# Patient Record
Sex: Male | Born: 1940
Health system: Southern US, Community
[De-identification: ages and names within clinical notes are randomized; demographics above are authoritative.]

## PROBLEM LIST (undated history)

## (undated) DIAGNOSIS — K5792 Diverticulitis of intestine, part unspecified, without perforation or abscess without bleeding: Secondary | ICD-10-CM

## (undated) DIAGNOSIS — N200 Calculus of kidney: Secondary | ICD-10-CM

## (undated) DIAGNOSIS — I340 Nonrheumatic mitral (valve) insufficiency: Secondary | ICD-10-CM

## (undated) DIAGNOSIS — I10 Essential (primary) hypertension: Secondary | ICD-10-CM

## (undated) DIAGNOSIS — E785 Hyperlipidemia, unspecified: Secondary | ICD-10-CM

## (undated) DIAGNOSIS — Z87442 Personal history of urinary calculi: Secondary | ICD-10-CM

## (undated) DIAGNOSIS — Z8489 Family history of other specified conditions: Secondary | ICD-10-CM

## (undated) DIAGNOSIS — K635 Polyp of colon: Secondary | ICD-10-CM

## (undated) DIAGNOSIS — K219 Gastro-esophageal reflux disease without esophagitis: Secondary | ICD-10-CM

## (undated) DIAGNOSIS — I219 Acute myocardial infarction, unspecified: Secondary | ICD-10-CM

## (undated) DIAGNOSIS — I2510 Atherosclerotic heart disease of native coronary artery without angina pectoris: Secondary | ICD-10-CM

## (undated) DIAGNOSIS — H9190 Unspecified hearing loss, unspecified ear: Secondary | ICD-10-CM

## (undated) DIAGNOSIS — I351 Nonrheumatic aortic (valve) insufficiency: Secondary | ICD-10-CM

## (undated) DIAGNOSIS — B019 Varicella without complication: Secondary | ICD-10-CM

## (undated) DIAGNOSIS — C439 Malignant melanoma of skin, unspecified: Secondary | ICD-10-CM

## (undated) DIAGNOSIS — R011 Cardiac murmur, unspecified: Secondary | ICD-10-CM

## (undated) DIAGNOSIS — I255 Ischemic cardiomyopathy: Secondary | ICD-10-CM

## (undated) DIAGNOSIS — M1990 Unspecified osteoarthritis, unspecified site: Secondary | ICD-10-CM

## (undated) DIAGNOSIS — I519 Heart disease, unspecified: Secondary | ICD-10-CM

## (undated) DIAGNOSIS — E119 Type 2 diabetes mellitus without complications: Secondary | ICD-10-CM

## (undated) HISTORY — DX: Type 2 diabetes mellitus without complications: E11.9

## (undated) HISTORY — DX: Unspecified osteoarthritis, unspecified site: M19.90

## (undated) HISTORY — PX: CORONARY ANGIOPLASTY WITH STENT PLACEMENT: SHX49

## (undated) HISTORY — DX: Atherosclerotic heart disease of native coronary artery without angina pectoris: I25.10

## (undated) HISTORY — DX: Diverticulitis of intestine, part unspecified, without perforation or abscess without bleeding: K57.92

## (undated) HISTORY — DX: Nonrheumatic mitral (valve) insufficiency: I34.0

## (undated) HISTORY — PX: WISDOM TOOTH EXTRACTION: SHX21

## (undated) HISTORY — DX: Ischemic cardiomyopathy: I25.5

## (undated) HISTORY — DX: Essential (primary) hypertension: I10

## (undated) HISTORY — DX: Calculus of kidney: N20.0

## (undated) HISTORY — DX: Hyperlipidemia, unspecified: E78.5

## (undated) HISTORY — DX: Nonrheumatic aortic (valve) insufficiency: I35.1

## (undated) HISTORY — DX: Varicella without complication: B01.9

## (undated) HISTORY — DX: Malignant melanoma of skin, unspecified: C43.9

## (undated) HISTORY — DX: Heart disease, unspecified: I51.9

## (undated) HISTORY — PX: COLONOSCOPY: SHX174

## (undated) HISTORY — DX: Polyp of colon: K63.5

---
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
## 1978-01-14 DIAGNOSIS — C439 Malignant melanoma of skin, unspecified: Secondary | ICD-10-CM

## 1978-01-14 HISTORY — DX: Malignant melanoma of skin, unspecified: C43.9

## 1978-01-14 HISTORY — PX: MELANOMA EXCISION: SHX5266

## 2007-01-28 ENCOUNTER — Encounter: Payer: Self-pay | Admitting: Internal Medicine

## 2007-09-20 ENCOUNTER — Encounter: Payer: Self-pay | Admitting: Internal Medicine

## 2007-09-23 ENCOUNTER — Encounter: Payer: Self-pay | Admitting: Internal Medicine

## 2010-01-14 HISTORY — PX: CHOLECYSTECTOMY: SHX55

## 2010-10-15 ENCOUNTER — Encounter: Payer: Self-pay | Admitting: Internal Medicine

## 2013-01-14 HISTORY — PX: LITHOTRIPSY: SUR834

## 2013-01-14 HISTORY — PX: NERVE SURGERY: SHX1016

## 2013-08-18 ENCOUNTER — Encounter: Payer: Self-pay | Admitting: Internal Medicine

## 2013-09-15 ENCOUNTER — Encounter: Payer: Self-pay | Admitting: Internal Medicine

## 2013-09-16 ENCOUNTER — Encounter: Payer: Self-pay | Admitting: Internal Medicine

## 2013-09-22 ENCOUNTER — Encounter: Payer: Self-pay | Admitting: Internal Medicine

## 2013-09-28 ENCOUNTER — Encounter: Payer: Self-pay | Admitting: Internal Medicine

## 2013-10-01 ENCOUNTER — Encounter: Payer: Self-pay | Admitting: Internal Medicine

## 2013-10-06 ENCOUNTER — Encounter: Payer: Self-pay | Admitting: Internal Medicine

## 2014-03-01 DIAGNOSIS — M542 Cervicalgia: Secondary | ICD-10-CM | POA: Diagnosis not present

## 2014-03-01 DIAGNOSIS — J209 Acute bronchitis, unspecified: Secondary | ICD-10-CM | POA: Diagnosis not present

## 2014-05-10 DIAGNOSIS — I2510 Atherosclerotic heart disease of native coronary artery without angina pectoris: Secondary | ICD-10-CM | POA: Diagnosis not present

## 2014-05-10 DIAGNOSIS — I509 Heart failure, unspecified: Secondary | ICD-10-CM | POA: Diagnosis not present

## 2014-05-10 DIAGNOSIS — E119 Type 2 diabetes mellitus without complications: Secondary | ICD-10-CM | POA: Diagnosis not present

## 2014-06-15 DIAGNOSIS — L570 Actinic keratosis: Secondary | ICD-10-CM | POA: Diagnosis not present

## 2014-06-15 DIAGNOSIS — Z85820 Personal history of malignant melanoma of skin: Secondary | ICD-10-CM | POA: Diagnosis not present

## 2014-06-15 DIAGNOSIS — L821 Other seborrheic keratosis: Secondary | ICD-10-CM | POA: Diagnosis not present

## 2014-06-15 DIAGNOSIS — Z08 Encounter for follow-up examination after completed treatment for malignant neoplasm: Secondary | ICD-10-CM | POA: Diagnosis not present

## 2014-06-22 DIAGNOSIS — E119 Type 2 diabetes mellitus without complications: Secondary | ICD-10-CM | POA: Diagnosis not present

## 2014-06-22 DIAGNOSIS — E785 Hyperlipidemia, unspecified: Secondary | ICD-10-CM | POA: Diagnosis not present

## 2014-06-27 DIAGNOSIS — E119 Type 2 diabetes mellitus without complications: Secondary | ICD-10-CM | POA: Diagnosis not present

## 2014-06-28 DIAGNOSIS — I10 Essential (primary) hypertension: Secondary | ICD-10-CM | POA: Diagnosis not present

## 2014-06-28 DIAGNOSIS — J329 Chronic sinusitis, unspecified: Secondary | ICD-10-CM | POA: Diagnosis not present

## 2014-06-28 DIAGNOSIS — E785 Hyperlipidemia, unspecified: Secondary | ICD-10-CM | POA: Diagnosis not present

## 2014-06-28 DIAGNOSIS — E119 Type 2 diabetes mellitus without complications: Secondary | ICD-10-CM | POA: Diagnosis not present

## 2014-07-13 DIAGNOSIS — J310 Chronic rhinitis: Secondary | ICD-10-CM | POA: Diagnosis not present

## 2014-07-13 DIAGNOSIS — H9191 Unspecified hearing loss, right ear: Secondary | ICD-10-CM | POA: Diagnosis not present

## 2014-08-20 DIAGNOSIS — N200 Calculus of kidney: Secondary | ICD-10-CM | POA: Diagnosis not present

## 2014-08-22 DIAGNOSIS — N390 Urinary tract infection, site not specified: Secondary | ICD-10-CM | POA: Diagnosis not present

## 2014-08-22 DIAGNOSIS — N200 Calculus of kidney: Secondary | ICD-10-CM | POA: Diagnosis not present

## 2014-08-25 DIAGNOSIS — Z01818 Encounter for other preprocedural examination: Secondary | ICD-10-CM | POA: Diagnosis not present

## 2014-08-25 DIAGNOSIS — N200 Calculus of kidney: Secondary | ICD-10-CM | POA: Diagnosis not present

## 2014-08-30 DIAGNOSIS — J310 Chronic rhinitis: Secondary | ICD-10-CM | POA: Diagnosis not present

## 2014-09-12 ENCOUNTER — Encounter: Payer: Self-pay | Admitting: Internal Medicine

## 2014-09-12 DIAGNOSIS — N202 Calculus of kidney with calculus of ureter: Secondary | ICD-10-CM | POA: Diagnosis not present

## 2014-09-27 DIAGNOSIS — Z7189 Other specified counseling: Secondary | ICD-10-CM | POA: Diagnosis not present

## 2014-09-27 DIAGNOSIS — Z85820 Personal history of malignant melanoma of skin: Secondary | ICD-10-CM | POA: Diagnosis not present

## 2014-09-27 DIAGNOSIS — Z23 Encounter for immunization: Secondary | ICD-10-CM | POA: Diagnosis not present

## 2014-09-27 DIAGNOSIS — L570 Actinic keratosis: Secondary | ICD-10-CM | POA: Diagnosis not present

## 2014-09-27 DIAGNOSIS — D1801 Hemangioma of skin and subcutaneous tissue: Secondary | ICD-10-CM | POA: Diagnosis not present

## 2014-11-08 DIAGNOSIS — I2510 Atherosclerotic heart disease of native coronary artery without angina pectoris: Secondary | ICD-10-CM | POA: Diagnosis not present

## 2014-11-08 DIAGNOSIS — E119 Type 2 diabetes mellitus without complications: Secondary | ICD-10-CM | POA: Diagnosis not present

## 2014-11-08 DIAGNOSIS — I509 Heart failure, unspecified: Secondary | ICD-10-CM | POA: Diagnosis not present

## 2014-12-13 DIAGNOSIS — E119 Type 2 diabetes mellitus without complications: Secondary | ICD-10-CM | POA: Diagnosis not present

## 2014-12-13 DIAGNOSIS — H5213 Myopia, bilateral: Secondary | ICD-10-CM | POA: Diagnosis not present

## 2014-12-13 DIAGNOSIS — H2513 Age-related nuclear cataract, bilateral: Secondary | ICD-10-CM | POA: Diagnosis not present

## 2014-12-14 DIAGNOSIS — H40013 Open angle with borderline findings, low risk, bilateral: Secondary | ICD-10-CM | POA: Diagnosis not present

## 2014-12-19 DIAGNOSIS — E119 Type 2 diabetes mellitus without complications: Secondary | ICD-10-CM | POA: Diagnosis not present

## 2014-12-19 LAB — LIPID PANEL
Cholesterol: 122 mg/dL (ref 0–200)
HDL: 39 mg/dL (ref 35–70)
LDL Cholesterol: 42 mg/dL
Triglycerides: 203 mg/dL — AB (ref 40–160)

## 2014-12-19 LAB — BASIC METABOLIC PANEL
BUN: 19 mg/dL (ref 4–21)
Creatinine: 1 mg/dL (ref 0.6–1.3)
Glucose: 164 mg/dL
Potassium: 4 mmol/L (ref 3.4–5.3)
Sodium: 142 mmol/L (ref 137–147)

## 2014-12-19 LAB — CBC AND DIFFERENTIAL
HCT: 42 % (ref 41–53)
Hemoglobin: 14.6 g/dL (ref 13.5–17.5)
Platelets: 167 10*3/uL (ref 150–399)
WBC: 6.3 10^3/mL

## 2014-12-19 LAB — TSH: TSH: 0.76 u[IU]/mL (ref 0.41–5.90)

## 2014-12-19 LAB — HEPATIC FUNCTION PANEL
ALT: 52 U/L — AB (ref 10–40)
AST: 37 U/L (ref 14–40)
Alkaline Phosphatase: 37 U/L (ref 25–125)

## 2014-12-19 LAB — HEMOGLOBIN A1C: Hemoglobin A1C: 6.8

## 2014-12-23 DIAGNOSIS — E119 Type 2 diabetes mellitus without complications: Secondary | ICD-10-CM | POA: Diagnosis not present

## 2014-12-29 DIAGNOSIS — E785 Hyperlipidemia, unspecified: Secondary | ICD-10-CM | POA: Diagnosis not present

## 2014-12-29 DIAGNOSIS — E119 Type 2 diabetes mellitus without complications: Secondary | ICD-10-CM | POA: Diagnosis not present

## 2014-12-29 DIAGNOSIS — I10 Essential (primary) hypertension: Secondary | ICD-10-CM | POA: Diagnosis not present

## 2014-12-29 DIAGNOSIS — Z1211 Encounter for screening for malignant neoplasm of colon: Secondary | ICD-10-CM | POA: Diagnosis not present

## 2014-12-30 DIAGNOSIS — Z23 Encounter for immunization: Secondary | ICD-10-CM | POA: Diagnosis not present

## 2015-01-06 DIAGNOSIS — Z23 Encounter for immunization: Secondary | ICD-10-CM | POA: Diagnosis not present

## 2015-04-12 DIAGNOSIS — Z800 Family history of malignant neoplasm of digestive organs: Secondary | ICD-10-CM | POA: Diagnosis not present

## 2015-04-12 DIAGNOSIS — Z86010 Personal history of colonic polyps: Secondary | ICD-10-CM | POA: Diagnosis not present

## 2015-04-12 DIAGNOSIS — D123 Benign neoplasm of transverse colon: Secondary | ICD-10-CM | POA: Diagnosis not present

## 2015-04-12 DIAGNOSIS — D122 Benign neoplasm of ascending colon: Secondary | ICD-10-CM | POA: Diagnosis not present

## 2015-04-12 DIAGNOSIS — K5730 Diverticulosis of large intestine without perforation or abscess without bleeding: Secondary | ICD-10-CM | POA: Diagnosis not present

## 2015-04-12 LAB — HM COLONOSCOPY

## 2015-04-13 DIAGNOSIS — D122 Benign neoplasm of ascending colon: Secondary | ICD-10-CM | POA: Diagnosis not present

## 2015-04-13 DIAGNOSIS — D123 Benign neoplasm of transverse colon: Secondary | ICD-10-CM | POA: Diagnosis not present

## 2015-04-24 DIAGNOSIS — R109 Unspecified abdominal pain: Secondary | ICD-10-CM | POA: Diagnosis not present

## 2015-05-09 DIAGNOSIS — I2510 Atherosclerotic heart disease of native coronary artery without angina pectoris: Secondary | ICD-10-CM | POA: Diagnosis not present

## 2015-05-09 DIAGNOSIS — E119 Type 2 diabetes mellitus without complications: Secondary | ICD-10-CM | POA: Diagnosis not present

## 2015-05-09 DIAGNOSIS — I509 Heart failure, unspecified: Secondary | ICD-10-CM | POA: Diagnosis not present

## 2015-07-07 ENCOUNTER — Ambulatory Visit (INDEPENDENT_AMBULATORY_CARE_PROVIDER_SITE_OTHER): Payer: Medicare Other | Admitting: Family Medicine

## 2015-07-07 ENCOUNTER — Encounter: Payer: Self-pay | Admitting: Family Medicine

## 2015-07-07 VITALS — BP 126/62 | HR 60 | Temp 97.6°F | Ht 73.0 in | Wt 209.2 lb

## 2015-07-07 DIAGNOSIS — I2510 Atherosclerotic heart disease of native coronary artery without angina pectoris: Secondary | ICD-10-CM | POA: Diagnosis not present

## 2015-07-07 DIAGNOSIS — R200 Anesthesia of skin: Secondary | ICD-10-CM

## 2015-07-07 DIAGNOSIS — I10 Essential (primary) hypertension: Secondary | ICD-10-CM

## 2015-07-07 DIAGNOSIS — R1032 Left lower quadrant pain: Secondary | ICD-10-CM

## 2015-07-07 DIAGNOSIS — E785 Hyperlipidemia, unspecified: Secondary | ICD-10-CM | POA: Diagnosis not present

## 2015-07-07 DIAGNOSIS — E1142 Type 2 diabetes mellitus with diabetic polyneuropathy: Secondary | ICD-10-CM | POA: Diagnosis not present

## 2015-07-07 DIAGNOSIS — G4700 Insomnia, unspecified: Secondary | ICD-10-CM

## 2015-07-07 DIAGNOSIS — R208 Other disturbances of skin sensation: Secondary | ICD-10-CM

## 2015-07-07 NOTE — Progress Notes (Signed)
Pre visit review using our clinic review tool, if applicable. No additional management support is needed unless otherwise documented below in the visit note. 

## 2015-07-07 NOTE — Patient Instructions (Signed)
Continue your current medications.  We will call regarding the cardiology appt.  Follow up in 3 months or sooner if needed.   Take care  Dr. Lacinda Axon   Health Maintenance, Male A healthy lifestyle and preventative care can promote health and wellness.  Maintain regular health, dental, and eye exams.  Eat a healthy diet. Foods like vegetables, fruits, whole grains, low-fat dairy products, and lean protein foods contain the nutrients you need and are low in calories. Decrease your intake of foods high in solid fats, added sugars, and salt. Get information about a proper diet from your health care provider, if necessary.  Regular physical exercise is one of the most important things you can do for your health. Most adults should get at least 150 minutes of moderate-intensity exercise (any activity that increases your heart rate and causes you to sweat) each week. In addition, most adults need muscle-strengthening exercises on 2 or more days a week.   Maintain a healthy weight. The body mass index (BMI) is a screening tool to identify possible weight problems. It provides an estimate of body fat based on height and weight. Your health care provider can find your BMI and can help you achieve or maintain a healthy weight. For males 20 years and older:  A BMI below 18.5 is considered underweight.  A BMI of 18.5 to 24.9 is normal.  A BMI of 25 to 29.9 is considered overweight.  A BMI of 30 and above is considered obese.  Maintain normal blood lipids and cholesterol by exercising and minimizing your intake of saturated fat. Eat a balanced diet with plenty of fruits and vegetables. Blood tests for lipids and cholesterol should begin at age 99 and be repeated every 5 years. If your lipid or cholesterol levels are high, you are over age 46, or you are at high risk for heart disease, you may need your cholesterol levels checked more frequently.Ongoing high lipid and cholesterol levels should be treated  with medicines if diet and exercise are not working.  If you smoke, find out from your health care provider how to quit. If you do not use tobacco, do not start.  Lung cancer screening is recommended for adults aged 59-80 years who are at high risk for developing lung cancer because of a history of smoking. A yearly low-dose CT scan of the lungs is recommended for people who have at least a 30-pack-year history of smoking and are current smokers or have quit within the past 15 years. A pack year of smoking is smoking an average of 1 pack of cigarettes a day for 1 year (for example, a 30-pack-year history of smoking could mean smoking 1 pack a day for 30 years or 2 packs a day for 15 years). Yearly screening should continue until the smoker has stopped smoking for at least 15 years. Yearly screening should be stopped for people who develop a health problem that would prevent them from having lung cancer treatment.  If you choose to drink alcohol, do not have more than 2 drinks per day. One drink is considered to be 12 oz (360 mL) of beer, 5 oz (150 mL) of wine, or 1.5 oz (45 mL) of liquor.  Avoid the use of street drugs. Do not share needles with anyone. Ask for help if you need support or instructions about stopping the use of drugs.  High blood pressure causes heart disease and increases the risk of stroke. High blood pressure is more likely to develop  in:  People who have blood pressure in the end of the normal range (100-139/85-89 mm Hg).  People who are overweight or obese.  People who are African American.  If you are 60-54 years of age, have your blood pressure checked every 3-5 years. If you are 21 years of age or older, have your blood pressure checked every year. You should have your blood pressure measured twice--once when you are at a hospital or clinic, and once when you are not at a hospital or clinic. Record the average of the two measurements. To check your blood pressure when you  are not at a hospital or clinic, you can use:  An automated blood pressure machine at a pharmacy.  A home blood pressure monitor.  If you are 43-88 years old, ask your health care provider if you should take aspirin to prevent heart disease.  Diabetes screening involves taking a blood sample to check your fasting blood sugar level. This should be done once every 3 years after age 73 if you are at a normal weight and without risk factors for diabetes. Testing should be considered at a younger age or be carried out more frequently if you are overweight and have at least 1 risk factor for diabetes.  Colorectal cancer can be detected and often prevented. Most routine colorectal cancer screening begins at the age of 21 and continues through age 75. However, your health care provider may recommend screening at an earlier age if you have risk factors for colon cancer. On a yearly basis, your health care provider may provide home test kits to check for hidden blood in the stool. A small camera at the end of a tube may be used to directly examine the colon (sigmoidoscopy or colonoscopy) to detect the earliest forms of colorectal cancer. Talk to your health care provider about this at age 22 when routine screening begins. A direct exam of the colon should be repeated every 5-10 years through age 68, unless early forms of precancerous polyps or small growths are found.  People who are at an increased risk for hepatitis B should be screened for this virus. You are considered at high risk for hepatitis B if:  You were born in a country where hepatitis B occurs often. Talk with your health care provider about which countries are considered high risk.  Your parents were born in a high-risk country and you have not received a shot to protect against hepatitis B (hepatitis B vaccine).  You have HIV or AIDS.  You use needles to inject street drugs.  You live with, or have sex with, someone who has hepatitis  B.  You are a man who has sex with other men (MSM).  You get hemodialysis treatment.  You take certain medicines for conditions like cancer, organ transplantation, and autoimmune conditions.  Hepatitis C blood testing is recommended for all people born from [birth year] through 1965 and any individual with known risk factors for hepatitis C.  Healthy men should no longer receive prostate-specific antigen (PSA) blood tests as part of routine cancer screening. Talk to your health care provider about prostate cancer screening.  Testicular cancer screening is not recommended for adolescents or adult males who have no symptoms. Screening includes self-exam, a health care provider exam, and other screening tests. Consult with your health care provider about any symptoms you have or any concerns you have about testicular cancer.  Practice safe sex. Use condoms and avoid high-risk sexual practices to reduce the  spread of sexually transmitted infections (STIs).  You should be screened for STIs, including gonorrhea and chlamydia if:  You are sexually active and are younger than 24 years.  You are older than 24 years, and your health care provider tells you that you are at risk for this type of infection.  Your sexual activity has changed since you were last screened, and you are at an increased risk for chlamydia or gonorrhea. Ask your health care provider if you are at risk.  If you are at risk of being infected with HIV, it is recommended that you take a prescription medicine daily to prevent HIV infection. This is called pre-exposure prophylaxis (PrEP). You are considered at risk if:  You are a man who has sex with other men (MSM).  You are a heterosexual man who is sexually active with multiple partners.  You take drugs by injection.  You are sexually active with a partner who has HIV.  Talk with your health care provider about whether you are at high risk of being infected with HIV. If you  choose to begin PrEP, you should first be tested for HIV. You should then be tested every 3 months for as long as you are taking PrEP.  Use sunscreen. Apply sunscreen liberally and repeatedly throughout the day. You should seek shade when your shadow is shorter than you. Protect yourself by wearing long sleeves, pants, a wide-brimmed hat, and sunglasses year round whenever you are outdoors.  Tell your health care provider of new moles or changes in moles, especially if there is a change in shape or color. Also, tell your health care provider if a mole is larger than the size of a pencil eraser.  A one-time screening for abdominal aortic aneurysm (AAA) and surgical repair of large AAAs by ultrasound is recommended for men aged 11-75 years who are current or former smokers.  Stay current with your vaccines (immunizations).   This information is not intended to replace advice given to you by your health care provider. Make sure you discuss any questions you have with your health care provider.   Document Released: 06/29/2007 Document Revised: 01/21/2014 Document Reviewed: 05/28/2010 Elsevier Interactive Patient Education [employer].

## 2015-07-10 ENCOUNTER — Encounter: Payer: Self-pay | Admitting: Family Medicine

## 2015-07-10 DIAGNOSIS — R1032 Left lower quadrant pain: Secondary | ICD-10-CM | POA: Insufficient documentation

## 2015-07-10 DIAGNOSIS — E782 Mixed hyperlipidemia: Secondary | ICD-10-CM | POA: Insufficient documentation

## 2015-07-10 DIAGNOSIS — Z85820 Personal history of malignant melanoma of skin: Secondary | ICD-10-CM | POA: Insufficient documentation

## 2015-07-10 DIAGNOSIS — R200 Anesthesia of skin: Secondary | ICD-10-CM | POA: Insufficient documentation

## 2015-07-10 DIAGNOSIS — E118 Type 2 diabetes mellitus with unspecified complications: Secondary | ICD-10-CM | POA: Insufficient documentation

## 2015-07-10 DIAGNOSIS — G4700 Insomnia, unspecified: Secondary | ICD-10-CM | POA: Insufficient documentation

## 2015-07-10 DIAGNOSIS — N400 Enlarged prostate without lower urinary tract symptoms: Secondary | ICD-10-CM | POA: Insufficient documentation

## 2015-07-10 DIAGNOSIS — I2510 Atherosclerotic heart disease of native coronary artery without angina pectoris: Secondary | ICD-10-CM | POA: Insufficient documentation

## 2015-07-10 DIAGNOSIS — I10 Essential (primary) hypertension: Secondary | ICD-10-CM | POA: Insufficient documentation

## 2015-07-10 DIAGNOSIS — G8929 Other chronic pain: Secondary | ICD-10-CM | POA: Insufficient documentation

## 2015-07-10 DIAGNOSIS — M545 Low back pain, unspecified: Secondary | ICD-10-CM | POA: Insufficient documentation

## 2015-07-10 NOTE — Progress Notes (Signed)
Subjective:  Patient ID: [name] [name], male    DOB: 1940/02/24  Age: 75 y.o. MRN: [id number]  CC: Establish care, Abdominal pain, Hand numbness, Insomnia  HPI [name] [name] is a 75 y.o. male presents to the clinic today to establish care. Concerns/issues are below.    CAD  S/p MI x 2 and s/p PCI.  Currently stable. No Recent chest pain or increasing shortness of breath.  Compliant with aspirin, statin, ACE inhibitor, beta blocker.  Needs a cardiologist in our area.   HTN  Well controlled on HCTZ, lisinopril, metoprolol.  DM-2  Last A1c was 6.8 in December 2016.  Blood sugars well controlled and at goal.  Patient currently on metformin.  HLD  Triglycerides mildly elevated on last lipid panel in December 2016. LDL at that time was 42.  Patient currently on Lipitor 80 and fenofibrate.  Tolerating.  Stable.  Abdominal pain  Patient reports that he's had intermittent left lower quadrant pain for the past 6-8 months.  He reports that he was previously treated for diverticulitis with Cipro and Flagyl and had improvement.  However, his symptoms recurred.  He reports associated diarrhea but is unsure if this is from metformin.  Pain is moderate and intermittent.  No associated fevers or chills.  Of note, he had a colonoscopy earlier this year and was not told of any findings of diverticulitis or other abnormalities (other than polyps).  Hand numbness  Patient states for the past year he's had increasing numbness in his third, fourth and fifth fingers.  He states that it makes it difficult to grip things.  He states it's been worsening for the past month.  No known inciting factor. No known exacerbating or relieving factors.  Insomnia  Patient has recently been waking up in the middle of night and having difficulty going back to sleep.  He states that he typically works on something when he wakes up. He does this for a couple hours and then  returns back to sleep.  He attributes this to recent stressors and recent move.  He would like to discuss this today.  PMH, Surgical Hx, Family Hx, Social History reviewed and updated as below.  Past Medical History  Diagnosis Date  . Arthritis   . DM type 2 (diabetes mellitus, type 2) ([place])   . Hypertension   . Hyperlipidemia   . CAD (coronary artery disease)   . Chicken pox   . Diverticulitis   . Colon polyps     4 pre-cancerous   . Kidney stones   . Melanoma [facility])    Past Surgical History  Procedure Laterality Date  . Cholecystectomy  2012  . Tonsillectomy  1945  . Lithotripsy  2015  . Melanoma excision  1980    malignant  . Nerve surgery  2015    ulna nerve  . Coronary angioplasty with stent placement  1991 & 2005   Family History  Problem Relation Age of Onset  . Hyperlipidemia Mother   . Hypertension Mother   . Heart disease Mother   . Diabetes Mother   . Colon cancer Father   . Lung cancer Father   . Kidney cancer Father     malignant capsulated kidney tumor  . Diabetes Father    Social History  Substance Use Topics  . Smoking status: Current Every Day Smoker    Types: Pipe  . Smokeless tobacco: Never Used  . Alcohol Use: 0.0 - 0.6 oz/week    0-1 Standard drinks  or equivalent per week   Review of Systems  HENT: Positive for hearing loss.   Gastrointestinal: Positive for abdominal pain and diarrhea.  Genitourinary:       Frequent urination.  Musculoskeletal: Positive for myalgias.  All other systems reviewed and are negative.  Objective:   Today's Vitals: BP 126/62 mmHg  Pulse 60  Temp(Src) 97.6 F (36.4 C) (Oral)  Ht 6\' 1"  (1.854 m)  Wt 209 lb 4 oz (94.915 kg)  BMI 27.61 kg/m2  SpO2 95%  Physical Exam  Constitutional: He is oriented to person, place, and time. He appears well-developed and well-nourished. No distress.  HENT:  Head: Normocephalic and atraumatic.  Mouth/Throat: Oropharynx is clear and moist. No oropharyngeal exudate.    Normal TM's bilaterally.   Eyes: Conjunctivae are normal. No scleral icterus.  Neck: Neck supple.  Cardiovascular: Normal rate and regular rhythm.   [id number] systolic murmur.  Pulmonary/Chest: Effort normal and breath sounds normal. He has no wheezes. He has no rales.  Abdominal: Soft. He exhibits no distension.  Tender to palpation in the LUQ and LLQ.  Musculoskeletal: Normal range of motion. He exhibits no edema.  Neurological: He is alert and oriented to person, place, and time.  Skin: Skin is warm and dry. No rash noted.  Psychiatric: He has a normal mood and affect.  Vitals reviewed.  Assessment & Plan:   Problem List Items Addressed This Visit    CAD (coronary artery disease) - Primary    Stable.  Continue aspirin, metoprolol, lisinopril, Lipitor. Referring to cardiology. Will obtain records.       Relevant Medications   metoprolol succinate (TOPROL-XL) 25 MG 24 hr tablet   fenofibrate 160 MG tablet   lisinopril (PRINIVIL,ZESTRIL) 10 MG tablet   hydrochlorothiazide (HYDRODIURIL) 25 MG tablet   atorvastatin (LIPITOR) 80 MG tablet   aspirin EC 81 MG tablet   Other Relevant Orders   Ambulatory referral to Cardiology   HTN (hypertension)    Stable. At goal. Continue HCTZ, lisinopril, metoprolol.      Relevant Medications   metoprolol succinate (TOPROL-XL) 25 MG 24 hr tablet   fenofibrate 160 MG tablet   lisinopril (PRINIVIL,ZESTRIL) 10 MG tablet   hydrochlorothiazide (HYDRODIURIL) 25 MG tablet   atorvastatin (LIPITOR) 80 MG tablet   aspirin EC 81 MG tablet   HLD (hyperlipidemia)    Well controlled. Continue Lipitor 80 mg daily and fenofibrate.      Relevant Medications   metoprolol succinate (TOPROL-XL) 25 MG 24 hr tablet   fenofibrate 160 MG tablet   lisinopril (PRINIVIL,ZESTRIL) 10 MG tablet   hydrochlorothiazide (HYDRODIURIL) 25 MG tablet   atorvastatin (LIPITOR) 80 MG tablet   aspirin EC 81 MG tablet   DM type 2 (diabetes mellitus, type 2) ([place])    Well  controlled. Continue metformin.      Relevant Medications   metFORMIN (GLUMETZA) 500 MG (MOD) 24 hr tablet   lisinopril (PRINIVIL,ZESTRIL) 10 MG tablet   atorvastatin (LIPITOR) 80 MG tablet   aspirin EC 81 MG tablet   LLQ abdominal pain    Chronic. Unclear etiology especially in the setting of recent colonoscopy. I advised the patient that I recommend CT scan. Patient would like to watch and wait at this time.      Bilateral hand numbness    Does not fit distribution of carpal tunnel. Recommended neck imaging. Patient elected to wait.      Insomnia    Likely stress induced. Will continue to monitor.  Outpatient Encounter Prescriptions as of 07/07/2015  Medication Sig  . aspirin EC 81 MG tablet Take 81 mg by mouth daily.  [name] atorvastatin (LIPITOR) 80 MG tablet Take 80 mg by mouth daily.  . clonazePAM (KLONOPIN) 0.5 MG tablet Take 0.5 mg by mouth 2 (two) times daily as needed for anxiety.  . fenofibrate 160 MG tablet Take 160 mg by mouth daily.  . hydrochlorothiazide (HYDRODIURIL) 25 MG tablet Take 25 mg by mouth daily.  [name] lisinopril (PRINIVIL,ZESTRIL) 10 MG tablet Take 10 mg by mouth daily.  . metFORMIN (GLUMETZA) 500 MG (MOD) 24 hr tablet Take 500 mg by mouth daily with breakfast.  . metoprolol succinate (TOPROL-XL) 25 MG 24 hr tablet Take 25 mg by mouth daily.  . tamsulosin (FLOMAX) 0.4 MG CAPS capsule Take 0.4 mg by mouth.  . traMADol (ULTRAM) 50 MG tablet Take 50 mg by mouth every 6 (six) hours as needed.   No facility-administered encounter medications on file as of 07/07/2015.    Follow-up: Return in about 3 months (around 10/07/2015).  [place]

## 2015-07-10 NOTE — Assessment & Plan Note (Signed)
Well controlled. Continue Lipitor 80 mg daily and fenofibrate.

## 2015-07-10 NOTE — Assessment & Plan Note (Signed)
Stable. At goal. Continue HCTZ, lisinopril, metoprolol.

## 2015-07-10 NOTE — Assessment & Plan Note (Signed)
Does not fit distribution of carpal tunnel. Recommended neck imaging. Patient elected to wait.

## 2015-07-10 NOTE — Assessment & Plan Note (Addendum)
Stable.  Continue aspirin, metoprolol, lisinopril, Lipitor. Referring to cardiology. Will obtain records.

## 2015-07-10 NOTE — Assessment & Plan Note (Signed)
Chronic. Unclear etiology especially in the setting of recent colonoscopy. I advised the patient that I recommend CT scan. Patient would like to watch and wait at this time.

## 2015-07-10 NOTE — Assessment & Plan Note (Signed)
Likely stress induced. Will continue to monitor.

## 2015-07-10 NOTE — Assessment & Plan Note (Signed)
Well controlled. Continue metformin.

## 2015-07-13 ENCOUNTER — Encounter: Payer: Self-pay | Admitting: Family Medicine

## 2015-07-25 ENCOUNTER — Telehealth: Payer: Self-pay | Admitting: Family Medicine

## 2015-07-25 NOTE — Telephone Encounter (Signed)
Pt called about possibly needing a Cat scan for his low back and neck with pain

## 2015-07-28 ENCOUNTER — Ambulatory Visit (INDEPENDENT_AMBULATORY_CARE_PROVIDER_SITE_OTHER): Payer: Medicare Other

## 2015-07-28 ENCOUNTER — Ambulatory Visit (INDEPENDENT_AMBULATORY_CARE_PROVIDER_SITE_OTHER): Payer: Medicare Other | Admitting: Family Medicine

## 2015-07-28 ENCOUNTER — Encounter: Payer: Self-pay | Admitting: Family Medicine

## 2015-07-28 ENCOUNTER — Ambulatory Visit: Payer: Medicare Other | Admitting: Family Medicine

## 2015-07-28 VITALS — BP 124/80 | HR 52 | Temp 97.8°F | Wt 210.0 lb

## 2015-07-28 DIAGNOSIS — M545 Low back pain, unspecified: Secondary | ICD-10-CM

## 2015-07-28 DIAGNOSIS — Z720 Tobacco use: Secondary | ICD-10-CM

## 2015-07-28 DIAGNOSIS — I700 Atherosclerosis of aorta: Secondary | ICD-10-CM | POA: Insufficient documentation

## 2015-07-28 DIAGNOSIS — M5031 Other cervical disc degeneration,  high cervical region: Secondary | ICD-10-CM | POA: Diagnosis not present

## 2015-07-28 DIAGNOSIS — M542 Cervicalgia: Secondary | ICD-10-CM

## 2015-07-28 DIAGNOSIS — I2510 Atherosclerotic heart disease of native coronary artery without angina pectoris: Secondary | ICD-10-CM | POA: Diagnosis not present

## 2015-07-28 DIAGNOSIS — M5136 Other intervertebral disc degeneration, lumbar region: Secondary | ICD-10-CM | POA: Diagnosis not present

## 2015-07-28 DIAGNOSIS — G8929 Other chronic pain: Secondary | ICD-10-CM

## 2015-07-28 DIAGNOSIS — M5126 Other intervertebral disc displacement, lumbar region: Secondary | ICD-10-CM | POA: Diagnosis not present

## 2015-07-28 DIAGNOSIS — M50321 Other cervical disc degeneration at C4-C5 level: Secondary | ICD-10-CM | POA: Diagnosis not present

## 2015-07-28 DIAGNOSIS — F17200 Nicotine dependence, unspecified, uncomplicated: Secondary | ICD-10-CM

## 2015-07-28 NOTE — Assessment & Plan Note (Signed)
New problem. Noted on imaging. Needs cardiologist in our area given history of CAD. Also needs dedicated abdominal ultrasound to assess for AAA. Will order.

## 2015-07-28 NOTE — Progress Notes (Signed)
Subjective:  Patient ID: [name] [name], male    DOB: April 12, 1940  Age: 75 y.o. MRN: [id number]  CC: Back pain, neck pain  HPI:  75 year old male with a PMH of CAD, HTN, HLD, DM-2, Tobacco abuse, chronic back pain present with the above complaints.  Low back pain  Patient has a history of chronic low back pain.  He states that for the past week he's had worsening of his low back pain.  Moderate to severe.  Located particularly in the left low back.  He states that the pain interferes with ambulation and sleeping.  He's been taking Aleve 1 tablet twice a day which previously has worked. He's had no current improvement with this.  He reports some pain in his legs.  Neck pain  Patient reports that he has recently developed worsening neck pain as well.  Patient does have some numbness/tingling in his fingers.  As stated above he's been taking Aleve without significant improvement.  Patient also endorses weakness. He states that there are times where he has to physically lift his head with his hand.  Social Hx   Social History   Social History  . Marital Status: Married    Spouse Name: N/A  . Number of Children: N/A  . Years of Education: N/A   Social History Main Topics  . Smoking status: Current Every Day Smoker    Types: Pipe  . Smokeless tobacco: Never Used  . Alcohol Use: 0.0 - 0.6 oz/week    0-1 Standard drinks or equivalent per week  . Drug Use: No  . Sexual Activity: Not Asked   Other Topics Concern  . None   Social History Narrative   Review of Systems  Musculoskeletal: Positive for back pain and neck pain.  Neurological:       Numbness/paresthesia.   Objective:  BP 124/80 mmHg  Pulse 52  Temp(Src) 97.8 F (36.6 C) (Oral)  Wt 210 lb (95.255 kg)  SpO2 95%  BP/Weight 07/28/2015 [id number]  Systolic BP [id number]  Diastolic BP 80 62  Wt. (Lbs) 210 209.25  BMI 27.71 27.61   Physical Exam  Constitutional: He is oriented to person, place, and  time. He appears well-developed. No distress.  Pulmonary/Chest: Effort normal.  Musculoskeletal:  Neck - Normal ROM. Negative Spurling's.  Lumbar spine - tenderness at L5/S1.  Neurological: He is alert and oriented to person, place, and time.  Psychiatric: He has a normal mood and affect.  Vitals reviewed.  Lab Results  Component Value Date   WBC 6.3 12/19/2014   HGB 14.6 12/19/2014   HCT 42 12/19/2014   PLT 167 12/19/2014   CHOL 122 12/19/2014   TRIG 203* 12/19/2014   HDL 39 12/19/2014   LDLCALC 42 12/19/2014   ALT 52* 12/19/2014   AST 37 12/19/2014   NA 142 12/19/2014   K 4.0 12/19/2014   CREATININE 1.0 12/19/2014   BUN 19 12/19/2014   TSH 0.76 12/19/2014   HGBA1C 6.8 12/19/2014   Dg Cervical Spine Complete  07/28/2015  CLINICAL DATA:  Neck pain with numbness and tingling in the fingers; no report of trauma EXAM: CERVICAL SPINE - COMPLETE 4+ VIEW COMPARISON:  None in PACs FINDINGS: The cervical vertebral bodies are preserved in height. There is mild degenerative disc space narrowing at C3-4, C4-5, and C5-6. There are anterior endplate osteophytes at these levels. There is no perched facet or spinous process fracture. The odontoid is intact. The oblique views reveal mild multilevel bony  encroachment upon the neural foramina bilaterally. The prevertebral soft tissue spaces are normal. There is dense calcification in the region of the carotid bulbs bilaterally. IMPRESSION: 1. Mild multilevel degenerative disc disease of the mid cervical spine. Mild bony encroachment upon the neural foramina bilaterally due to endplate and uncovertebral joint osteophyte. Given the upper extremity symptoms, cervical spine MRI would be useful to evaluate the status of the cervical nerve roots and the spinal canal. 2. Calcification in the region of the carotid bulbs bilateral consistent with atherosclerosis. Electronically Signed   By: David  Martinique M.D.   On: 07/28/2015 11:51   Dg Lumbar Spine  Complete  07/28/2015  CLINICAL DATA:  Chronic back pain, increasing symptoms, EXAM: LUMBAR SPINE - COMPLETE 4+ VIEW COMPARISON:  None in PACs FINDINGS: The lumbar vertebral bodies are preserved in height. There is grade 1 anterolisthesis of L4 with respect L5. There is mild chin degenerative narrowing of the L4-5 disc space. There is moderate facet joint hypertrophy at L4-5 and at L5-S1. The pedicles and transverse processes are intact where visualized. The observed portions of the sacrum are normal. There is athero sclerotic calcification in the wall of the abdominal aorta. An infrarenal aortic bulge measuring 2.9 cm AP is observed. There are multiple coarse bilateral kidney stones. No definite urinary tract stones. There are surgical clips in the left aspect of the pelvis. IMPRESSION: 1. No acute compression fracture. Grade 1 anterolisthesis of L4 with respect L5 of uncertain age. Moderate L4-5 disc space narrowing and facet joint hypertrophy. There is also facet joint hypertrophy at L5-S1. 2. Atherosclerotic failure to taper the caliber of the abdominal aorta with a distal aortic bulge measuring 2.9 cm. A dedicated abdominal aortic ultrasound is recommended. 3. Innumerable coarse bilateral kidney stones. Electronically Signed   By: David  Martinique M.D.   On: 07/28/2015 11:55   Assessment & Plan:   Problem List Items Addressed This Visit    Chronic low back pain - Primary    Acute exacerbation.  X-ray revealed L4/5 disc narrowing. There is also grade 1 anterolisthesis of L4 on L5. Advised brief course of Aleve (increasing to 2 tablets twice daily). PRN Tramadol.  Discussed steroid taper and patient declined. Based on x-ray findings may need neurosurgical referral if he fails to improve.      Relevant Orders   DG Lumbar Spine Complete (Completed)   Neck pain    New problem. Xray revealed diffuse degenerative disc disease. Treating with Aleve (see Chronic low back pain). May need neurosurgery  referral.      Relevant Orders   DG Cervical Spine Complete (Completed)   Atherosclerosis of abdominal aorta ([place])    New problem. Noted on imaging. Needs cardiologist in our area given history of CAD. Also needs dedicated abdominal ultrasound to assess for AAA. Will order.        Follow-up: PRN  [place]

## 2015-07-28 NOTE — Patient Instructions (Addendum)
Continue the tramadol.  Increase the naproxen to 2 tablets twice daily (briefly).  We will call with the results.  Take care  Dr. Lacinda Axon

## 2015-07-28 NOTE — Assessment & Plan Note (Signed)
New problem. Xray revealed diffuse degenerative disc disease. Treating with Aleve (see Chronic low back pain). May need neurosurgery referral.

## 2015-07-28 NOTE — Assessment & Plan Note (Signed)
Acute exacerbation.  X-ray revealed L4/5 disc narrowing. There is also grade 1 anterolisthesis of L4 on L5. Advised brief course of Aleve (increasing to 2 tablets twice daily). PRN Tramadol.  Discussed steroid taper and patient declined. Based on x-ray findings may need neurosurgical referral if he fails to improve.

## 2015-07-28 NOTE — Progress Notes (Signed)
Pre visit review using our clinic review tool, if applicable. No additional management support is needed unless otherwise documented below in the visit note. 

## 2015-07-31 ENCOUNTER — Other Ambulatory Visit: Payer: Self-pay | Admitting: Family Medicine

## 2015-07-31 DIAGNOSIS — M542 Cervicalgia: Secondary | ICD-10-CM

## 2015-08-07 ENCOUNTER — Other Ambulatory Visit: Payer: Self-pay | Admitting: Family Medicine

## 2015-08-07 ENCOUNTER — Ambulatory Visit
Admission: RE | Admit: 2015-08-07 | Discharge: 2015-08-07 | Disposition: A | Discharge status: Home / Self Care | Payer: Medicare Other | Source: Ambulatory Visit | Attending: Family Medicine | Admitting: Family Medicine

## 2015-08-07 DIAGNOSIS — I714 Abdominal aortic aneurysm, without rupture, unspecified: Secondary | ICD-10-CM

## 2015-08-07 DIAGNOSIS — Q6102 Congenital multiple renal cysts: Secondary | ICD-10-CM | POA: Insufficient documentation

## 2015-08-07 DIAGNOSIS — I700 Atherosclerosis of aorta: Secondary | ICD-10-CM | POA: Diagnosis not present

## 2015-08-07 DIAGNOSIS — N200 Calculus of kidney: Secondary | ICD-10-CM | POA: Diagnosis not present

## 2015-08-07 DIAGNOSIS — F17200 Nicotine dependence, unspecified, uncomplicated: Secondary | ICD-10-CM

## 2015-08-07 DIAGNOSIS — Z720 Tobacco use: Secondary | ICD-10-CM | POA: Insufficient documentation

## 2015-08-09 ENCOUNTER — Telehealth: Payer: Self-pay | Admitting: Family Medicine

## 2015-08-09 NOTE — Telephone Encounter (Signed)
Pt left a message on office vm. He spoke to [facility] about sending his ultrasound results by email to him. He has not received them. Please send them to him. Pt states we have his current email address on file.

## 2015-08-09 NOTE — Telephone Encounter (Signed)
Please let patient know that results are in the mail we are unable to e-mail these results.

## 2015-08-15 ENCOUNTER — Encounter: Payer: Self-pay | Admitting: Cardiology

## 2015-08-15 ENCOUNTER — Encounter (INDEPENDENT_AMBULATORY_CARE_PROVIDER_SITE_OTHER): Payer: Self-pay

## 2015-08-15 ENCOUNTER — Ambulatory Visit (INDEPENDENT_AMBULATORY_CARE_PROVIDER_SITE_OTHER): Payer: Medicare Other | Admitting: Cardiology

## 2015-08-15 VITALS — BP 102/66 | HR 55 | Ht 73.0 in | Wt 207.1 lb

## 2015-08-15 DIAGNOSIS — I2510 Atherosclerotic heart disease of native coronary artery without angina pectoris: Secondary | ICD-10-CM

## 2015-08-15 DIAGNOSIS — I10 Essential (primary) hypertension: Secondary | ICD-10-CM

## 2015-08-15 DIAGNOSIS — I700 Atherosclerosis of aorta: Secondary | ICD-10-CM

## 2015-08-15 DIAGNOSIS — Z9861 Coronary angioplasty status: Secondary | ICD-10-CM | POA: Diagnosis not present

## 2015-08-15 DIAGNOSIS — E785 Hyperlipidemia, unspecified: Secondary | ICD-10-CM

## 2015-08-15 NOTE — Patient Instructions (Signed)
Follow-Up: Your physician wants you to follow-up in: 6 months with Dr. Ingal. You will receive a reminder letter in the mail two months in advance. If you don't receive a letter, please call our office to schedule the follow-up appointment.  It was a pleasure seeing you today here in the office. Please do not hesitate to give us a call back if you have any further questions. [phone]  Pamela A. RN, BSN    

## 2015-08-15 NOTE — Progress Notes (Addendum)
Cardiology Office Note   Date:  08/15/2015   ID:  [name] [name], DOB 1940-05-13, MRN [id number]  Referring Doctor:  Coral Spikes, DO   Cardiologist:   Wende Bushy, MD   Reason for consultation:  Chief Complaint  Patient presents with  . New Patient (Initial Visit)    no cp, sob or swelling. no other compliants.  . Coronary Artery Disease      History of Present Illness: [name] [name] is a 75 y.o. male who presents forEstablishing cardiology care for history of CAD.  Patient just moved from [place]. He has a history of CAD status post MI. Last stenting was from 2005 per medical records that patient brought with him.  When he had the MI, his symptoms were nausea and jaw pain. He had no chest pain or shortness of breath.   Recently he has been doing very well with no symptoms of chest pain or shortness breath. [facility] He had some recent stress testing at his previous cardiologist office. Official medical records are still to be obtained.   ROS:  Please see the history of present illness. Aside from mentioned under HPI, all other systems are reviewed and negative.     Past Medical History:  Diagnosis Date  . Arthritis   . CAD (coronary artery disease)   . Chicken pox   . Colon polyps    4 pre-cancerous   . Diverticulitis   . DM type 2 (diabetes mellitus, type 2) ([place])   . Hyperlipidemia   . Hypertension   . Kidney stones   . Melanoma [facility])     Past Surgical History:  Procedure Laterality Date  . CHOLECYSTECTOMY  2012  . CORONARY ANGIOPLASTY WITH STENT PLACEMENT  1991 & 2005  . LITHOTRIPSY  2015  . MELANOMA EXCISION  1980   malignant  . NERVE SURGERY  2015   ulna nerve  . TONSILLECTOMY  1945     reports that he has been smoking Pipe.  He has never used smokeless tobacco. He reports that he drinks alcohol. He reports that he does not use drugs.   family history includes Colon cancer in his father; Diabetes  in his father and mother; Heart disease in his mother; Hyperlipidemia in his mother; Hypertension in his mother; Kidney cancer in his father; Lung cancer in his father.   Outpatient Medications Prior to Visit  Medication Sig Dispense Refill  . aspirin EC 81 MG tablet Take 81 mg by mouth daily.    [name] atorvastatin (LIPITOR) 80 MG tablet Take 80 mg by mouth daily.    . clonazePAM (KLONOPIN) 0.5 MG tablet Take 0.5 mg by mouth 2 (two) times daily as needed for anxiety.    . fenofibrate 160 MG tablet Take 160 mg by mouth daily.    . hydrochlorothiazide (HYDRODIURIL) 25 MG tablet Take 25 mg by mouth daily.    [name] lisinopril (PRINIVIL,ZESTRIL) 10 MG tablet Take 10 mg by mouth daily.    . metFORMIN (GLUMETZA) 500 MG (MOD) 24 hr tablet Take 500 mg by mouth daily with breakfast.    . metoprolol succinate (TOPROL-XL) 25 MG 24 hr tablet Take 25 mg by mouth daily.    . tamsulosin (FLOMAX) 0.4 MG CAPS capsule Take 0.4 mg by mouth.    . traMADol (ULTRAM) 50 MG tablet Take 50 mg by mouth every 6 (six) hours as needed.     No facility-administered medications prior to visit.  Allergies: Azithromycin and Percocet [oxycodone-acetaminophen]    PHYSICAL EXAM: VS:  BP 102/66 (BP Location: Left Arm, Patient Position: Sitting, Cuff Size: Normal)   Pulse (!) 55   Ht 6\' 1"  (1.854 m)   Wt 207 lb 1.9 oz (93.9 kg)   BMI 27.33 kg/m  , Body mass index is 27.33 kg/m. Wt Readings from Last 3 Encounters:  08/15/15 207 lb 1.9 oz (93.9 kg)  07/28/15 210 lb (95.3 kg)  07/07/15 209 lb 4 oz (94.9 kg)    GENERAL:  well developed, well nourished, not in acute distress HEENT: normocephalic, pink conjunctivae, anicteric sclerae, no xanthelasma, normal dentition, oropharynx clear NECK:  no neck vein engorgement, JVP normal, no hepatojugular reflux, carotid upstroke brisk and symmetric, no bruit, no thyromegaly, no lymphadenopathy LUNGS:  good respiratory effort, clear to auscultation bilaterally CV:  PMI not displaced, no  thrills, no lifts, S1 and S2 within normal limits, no palpable S3 or S4, no murmurs, no rubs, no gallops ABD:  Soft, nontender, nondistended, normoactive bowel sounds, no abdominal aortic bruit, no hepatomegaly, no splenomegaly MS: nontender back, no kyphosis, no scoliosis, no joint deformities EXT:  2+ DP/PT pulses, no edema, no varicosities, no cyanosis, no clubbing SKIN: warm, nondiaphoretic, normal turgor, no ulcers NEUROPSYCH: alert, oriented to person, place, and time, sensory/motor grossly intact, normal mood, appropriate affect  Recent Labs: 12/19/2014: ALT 52; BUN 19; Creatinine 1.0; Hemoglobin 14.6; Platelets 167; Potassium 4.0; Sodium 142; TSH 0.76   Lipid Panel    Component Value Date/Time   CHOL 122 12/19/2014 1639   TRIG 203 (A) 12/19/2014 1639   HDL 39 12/19/2014 1639   LDLCALC 42 12/19/2014 1639     Other studies Reviewed:  EKG:  The ekg from08/01/2015 was personally reviewed by me and it revealed sinus bradycardia, 55 BPM. Frequent PCC's. Nonspecific IVCD. Nonspecific ST-T wave changes/T-wave inversion.  Additional studies/ records that were reviewed personally reviewed by me today include: Lexiscan stress test 03/20/2010: Old inferior lateral MI. Slight hypokinesia effort and inferolateral wall but EF is 60%. No ongoing ischemia.   ASSESSMENT AND PLAN: CAD status post MI. Status post stent 2005 Asymptomatic. He reports stress testing from 2015, essentially unremarkable per patient report We'll need to obtain complete medical records from cardiologist In the meantime, continue medical therapy with aspirin, and Toprol, atorvastatin, and lisinopril.  Patient is aware about diagnosis PVCs.  Abdominal aorta atherosclerosis PCP ordered an ultrasound of the abdomen and this showed: abdominal aortic atherosclerosis with minor ectasia, diameter of 2.9 cm. Recommend ultrasound 5 years. Discussed findings with patient. Patient is already on treatment for CAD with beta  blocker and statin therapy. Agree with recommendation of repeat ultrasound 5 years.  Hypertension BP is well controlled. Continue monitoring BP. Continue current medical therapy and lifestyle changes.   Hyperlipidemia LDL goal is less than 70 due to CAD and diabetes. PCP following labs.   Current medicines are reviewed at length with the patient today.  The patient does not have concerns regarding medicines.  Labs/ tests ordered today include:  Orders Placed This Encounter  Procedures  . EKG 12-Lead    I had a lengthy and detailed discussion with the patient regarding diagnoses, prognosis, diagnostic options, treatment options .   I counseled the patient on importance of lifestyle modification including heart healthy diet, regular physical activity .   Disposition:   FU with undersigned in 6 months  I spent at least 45 minutes with the patient today and more than 50% of the  time was spent counseling the patient and coordinating care.    Signed, Wende Bushy, MD  08/15/2015 2:35 PM    [place]  This note was generated in part with voice recognition software and I apologize for any typographical errors that were not detected and corrected.   Review of outside records: Pharmacologic nuclear stress test 03/26/2013,Lexiscan Old inferior MI. No ischemia. Well-preserved LVEF 50%. Slight hypokinesia of the inferior wall.  Echo 03/26/2013: LV borderline dilated. EF 50-55%. Mild global hypokinesis of LV. LA is moderately dilated. RV mildly dilated. Mild to moderate aortic sclerosis. Mild AI. Mild to moderate mitral valve thickening. Mild to moderate MR. RVSP 31 mmHg. Mild TR. Aortic root is normal in size. No pericardial effusion.

## 2015-08-16 ENCOUNTER — Ambulatory Visit
Admission: RE | Admit: 2015-08-16 | Discharge: 2015-08-16 | Disposition: A | Discharge status: Home / Self Care | Payer: Medicare Other | Source: Ambulatory Visit | Attending: Family Medicine | Admitting: Family Medicine

## 2015-08-16 DIAGNOSIS — M4802 Spinal stenosis, cervical region: Secondary | ICD-10-CM | POA: Insufficient documentation

## 2015-08-16 DIAGNOSIS — M47892 Other spondylosis, cervical region: Secondary | ICD-10-CM | POA: Insufficient documentation

## 2015-08-16 DIAGNOSIS — M542 Cervicalgia: Secondary | ICD-10-CM

## 2015-09-12 ENCOUNTER — Other Ambulatory Visit: Payer: Self-pay | Admitting: Family Medicine

## 2015-09-12 ENCOUNTER — Telehealth: Payer: Self-pay | Admitting: *Deleted

## 2015-09-12 NOTE — Telephone Encounter (Signed)
Faxed to number provided, only OV notes as labs were abstract from previous office. thanks

## 2015-09-12 NOTE — Telephone Encounter (Signed)
Dr. Tula Nakayama office has requested the most recent office notes along with recent labs Fax [phone]

## 2015-10-03 ENCOUNTER — Encounter: Payer: Self-pay | Admitting: Family Medicine

## 2015-10-03 ENCOUNTER — Ambulatory Visit (INDEPENDENT_AMBULATORY_CARE_PROVIDER_SITE_OTHER): Payer: Medicare Other | Admitting: Family Medicine

## 2015-10-03 DIAGNOSIS — I2510 Atherosclerotic heart disease of native coronary artery without angina pectoris: Secondary | ICD-10-CM

## 2015-10-03 DIAGNOSIS — I779 Disorder of arteries and arterioles, unspecified: Secondary | ICD-10-CM | POA: Insufficient documentation

## 2015-10-03 DIAGNOSIS — I739 Peripheral vascular disease, unspecified: Principal | ICD-10-CM

## 2015-10-03 MED ORDER — FLUTICASONE PROPIONATE 50 MCG/ACT NA SUSP: 2.0000 | Freq: Every day | NASAL | 6 refills | Status: DC

## 2015-10-03 NOTE — Assessment & Plan Note (Addendum)
New problem. Unclear prognosis/extent of disease. Suggested by recent xray. This is not surprising given his tobacco abuse, as well as carotid artery disease and atherosclerosis of the aorta. Obtaining carotid ultrasound for further evaluation.

## 2015-10-03 NOTE — Patient Instructions (Signed)
We will call with the appt.  Continue your meds.  Follow up ~ 1 month.  Take care  Dr. Lacinda Axon

## 2015-10-03 NOTE — Progress Notes (Signed)
Subjective:  Patient ID: [name] [name], male    DOB: Jun 15, 1940  Age: 75 y.o. MRN: [id number]  CC: Follow up regarding imaging from dentist  HPI:  75 year old male with hypertension, hyperlipidemia, CAD, atherosclerosis of the aorta/AAA, tobacco abuse presents for follow-up.  Patient recently had some dental work done. In the process of work up for his dental issues, an x-ray was obtained. X-ray revealed calcifications of the carotid arteries. As a result, he was instructed to follow-up with me regarding this result.  Patient presents today and states he is doing fair. He still having significant dental pain and sees had a wisdom tooth extraction. He was unaware of the results. I informed him of the findings suggestive of carotid disease. He states that he has has carotid ultrasounds in the past. He states other than his dental pain he is doing well. No reports of chest pain or shortness of breath. Patient continues to smoke.  Social Hx   Social History   Social History  . Marital status: Married    Spouse name: N/A  . Number of children: N/A  . Years of education: N/A   Social History Main Topics  . Smoking status: Current Every Day Smoker    Types: Pipe  . Smokeless tobacco: Never Used  . Alcohol use 0.0 - 0.6 oz/week  . Drug use: No  . Sexual activity: Not Asked   Other Topics Concern  . None   Social History Narrative  . None    Review of Systems  Constitutional: Negative.   HENT: Positive for dental problem.   Respiratory: Negative.   Cardiovascular: Negative.    Objective:  BP 126/84 (BP Location: Left Arm, Patient Position: Sitting)   Pulse 76   Temp 98.4 F (36.9 C) (Oral)   Wt 210 lb 9.6 oz (95.5 kg)   SpO2 95%   BMI 27.79 kg/m   BP/Weight 10/03/2015 08/15/2015 99991111  Systolic BP 123XX123 [id number] [id number]  Diastolic BP 84 66 80  Wt. (Lbs) 210.6 207.12 210  BMI 27.79 27.33 27.71   Physical Exam  Constitutional: He is oriented to person, place, and time. He  appears well-developed. No distress.  HENT:  Mouth/Throat: Oropharynx is clear and moist.  Pulmonary/Chest: Effort normal.  Neurological: He is alert and oriented to person, place, and time.  Psychiatric: He has a normal mood and affect.  Vitals reviewed.  Lab Results  Component Value Date   WBC 6.3 12/19/2014   HGB 14.6 12/19/2014   HCT 42 12/19/2014   PLT 167 12/19/2014   CHOL 122 12/19/2014   TRIG 203 (A) 12/19/2014   HDL 39 12/19/2014   LDLCALC 42 12/19/2014   ALT 52 (A) 12/19/2014   AST 37 12/19/2014   NA 142 12/19/2014   K 4.0 12/19/2014   CREATININE 1.0 12/19/2014   BUN 19 12/19/2014   TSH 0.76 12/19/2014   HGBA1C 6.8 12/19/2014    Assessment & Plan:   Problem List Items Addressed This Visit    Carotid artery disease ([place])    New problem. Unclear prognosis/extent of disease. Suggested by recent xray. This is not surprising given his tobacco abuse, as well as carotid artery disease and atherosclerosis of the aorta. Obtaining carotid ultrasound for further evaluation.      Relevant Orders   US Carotid Duplex Bilateral    Other Visit Diagnoses   None.     Meds ordered this encounter  Medications  . Ibuprofen 200 MG CAPS    Sig: Take  200 mg by mouth every 4 (four) hours as needed.  [name] HYDROcodone-acetaminophen (NORCO/VICODIN) 5-325 MG tablet    Sig: Take 1 tablet by mouth every 4 (four) hours as needed for moderate pain.  . fluticasone (FLONASE) 50 MCG/ACT nasal spray    Sig: Place 2 sprays into both nostrils daily.    Dispense:  16 g    Refill:  6    Follow-up: 1 month  [place]

## 2015-10-09 ENCOUNTER — Ambulatory Visit: Payer: Medicare Other | Admitting: Family Medicine

## 2015-10-17 ENCOUNTER — Encounter: Payer: Self-pay | Admitting: Family Medicine

## 2015-10-17 ENCOUNTER — Ambulatory Visit (INDEPENDENT_AMBULATORY_CARE_PROVIDER_SITE_OTHER): Payer: Medicare Other | Admitting: Family Medicine

## 2015-10-17 DIAGNOSIS — J019 Acute sinusitis, unspecified: Secondary | ICD-10-CM

## 2015-10-17 DIAGNOSIS — J0100 Acute maxillary sinusitis, unspecified: Secondary | ICD-10-CM | POA: Insufficient documentation

## 2015-10-17 DIAGNOSIS — I2510 Atherosclerotic heart disease of native coronary artery without angina pectoris: Secondary | ICD-10-CM | POA: Diagnosis not present

## 2015-10-17 MED ORDER — DOXYCYCLINE HYCLATE 100 MG PO TABS: 100.0000 mg | ORAL_TABLET | Freq: Two times a day (BID) | ORAL | 0 refills | Status: DC

## 2015-10-17 NOTE — Assessment & Plan Note (Signed)
New acute problem. Treating with Doxycycline. 

## 2015-10-17 NOTE — Progress Notes (Signed)
Pre visit review using our clinic review tool, if applicable. No additional management support is needed unless otherwise documented below in the visit note. 

## 2015-10-17 NOTE — Patient Instructions (Signed)
Take the antibiotic as prescribed.  Claritin/Zyrtec/Allegra or Xyzal during the day; Benadryl at night (or you can use Chlorpheniramine).  Take care  Dr. Lacinda Axon

## 2015-10-17 NOTE — Progress Notes (Signed)
   Subjective:  Patient ID: [name] [name], male    DOB: [birth year]  Age: 75 y.o. MRN: [id number]  CC: ? Sinus infection  HPI:  75 year old male with a complicated PMH including CAD, HTN, HLD, DM-2 presents with the above complaint.  Patient reports he's had sinus congestion and pressure for the past 3 weeks.  He reports associated postnasal drip and cough. Symptoms are severe. He's been using saltwater gargles as well as over-the-counter ibuprofen and Tylenol without significant improvement. No associated fever or chills. No known exacerbating factors. No other reported symptoms. No other complaints at this time.  Social Hx   Social History   Social History  . Marital status: Married    Spouse name: N/A  . Number of children: N/A  . Years of education: N/A   Social History Main Topics  . Smoking status: Current Every Day Smoker    Types: Pipe  . Smokeless tobacco: Never Used  . Alcohol use 0.0 - 0.6 oz/week  . Drug use: No  . Sexual activity: Not Asked   Other Topics Concern  . None   Social History Narrative  . None   Review of Systems  Constitutional: Positive for fatigue. Negative for fever.  HENT: Positive for congestion, postnasal drip and sinus pressure.   Respiratory: Positive for cough.    Objective:  BP 120/74 (BP Location: Right Arm, Patient Position: Sitting, Cuff Size: Normal)   Pulse 68   Temp 97.7 F (36.5 C) (Oral)   Wt 209 lb 4 oz (94.9 kg)   SpO2 94%   BMI 27.61 kg/m   BP/Weight 10/17/2015 [id number] A999333  Systolic BP 123456 [id number] A999333  Diastolic BP 74 84 66  Wt. (Lbs) 209.25 210.6 207.12  BMI 27.61 27.79 27.33   Physical Exam  Constitutional: He is oriented to person, place, and time.  Appears fatigued. NAD.  HENT:  Oropharynx with mild erythema. Normal TMs bilaterally. Mild max or sinus tenderness to palpation, right greater than left.  Cardiovascular: Normal rate and regular rhythm.   2/6 systolic murmur.  Pulmonary/Chest: Effort  normal. He has no wheezes. He has no rales.  Neurological: He is alert and oriented to person, place, and time.  Psychiatric: He has a normal mood and affect.  Vitals reviewed.  Lab Results  Component Value Date   WBC 6.3 12/19/2014   HGB 14.6 12/19/2014   HCT 42 12/19/2014   PLT 167 12/19/2014   CHOL 122 12/19/2014   TRIG 203 (A) 12/19/2014   HDL 39 12/19/2014   LDLCALC 42 12/19/2014   ALT 52 (A) 12/19/2014   AST 37 12/19/2014   NA 142 12/19/2014   K 4.0 12/19/2014   CREATININE 1.0 12/19/2014   BUN 19 12/19/2014   TSH 0.76 12/19/2014   HGBA1C 6.8 12/19/2014    Assessment & Plan:   Problem List Items Addressed This Visit    Acute sinusitis    New acute problem. Treating with Doxycycline.      Relevant Medications   doxycycline (VIBRA-TABS) 100 MG tablet    Other Visit Diagnoses   None.     Meds ordered this encounter  Medications  . doxycycline (VIBRA-TABS) 100 MG tablet    Sig: Take 1 tablet (100 mg total) by mouth 2 (two) times daily with a meal.    Dispense:  20 tablet    Refill:  0   Follow-up: PRN  [place]

## 2015-10-20 ENCOUNTER — Telehealth: Payer: Self-pay | Admitting: Family Medicine

## 2015-10-20 NOTE — Telephone Encounter (Signed)
Pt came and for a sinus infection was prescribed doxycycline (VIBRA-TABS) 100 MG tablet and has been taking it for 3 days and has developed diarrhea. Pt has stopped the doxycycline (VIBRA-TABS) 100 MG tablet and has been taking benadryl and ibprofuen and the sinus infection.  Granddaughter is getting married tomorrow and will be out of town. Please advise, thank you!  Call pt @ [phone]

## 2015-10-20 NOTE — Telephone Encounter (Signed)
Any recommendations for pt and medication options.

## 2015-10-20 NOTE — Telephone Encounter (Signed)
I can send another antibiotic if he likes. However, diarrhea is a very common side effect and he will likely have it with the next antibiotic as well.

## 2015-10-20 NOTE — Telephone Encounter (Signed)
LVTCB

## 2015-10-23 ENCOUNTER — Ambulatory Visit: Payer: Medicare Other | Admitting: Family Medicine

## 2015-10-23 NOTE — Telephone Encounter (Signed)
Pt is doing better and wants to let it ride until his next appt.

## 2015-10-23 NOTE — Telephone Encounter (Signed)
Secondary voicemail left.

## 2015-10-26 ENCOUNTER — Other Ambulatory Visit: Payer: Self-pay | Admitting: Family Medicine

## 2015-11-01 ENCOUNTER — Encounter: Payer: Self-pay | Admitting: Family Medicine

## 2015-11-01 ENCOUNTER — Ambulatory Visit (INDEPENDENT_AMBULATORY_CARE_PROVIDER_SITE_OTHER): Payer: Medicare Other | Admitting: Family Medicine

## 2015-11-01 VITALS — BP 138/82 | HR 66 | Temp 97.5°F | Wt 212.1 lb

## 2015-11-01 DIAGNOSIS — I10 Essential (primary) hypertension: Secondary | ICD-10-CM | POA: Diagnosis not present

## 2015-11-01 DIAGNOSIS — I739 Peripheral vascular disease, unspecified: Secondary | ICD-10-CM

## 2015-11-01 DIAGNOSIS — I779 Disorder of arteries and arterioles, unspecified: Secondary | ICD-10-CM

## 2015-11-01 DIAGNOSIS — E782 Mixed hyperlipidemia: Secondary | ICD-10-CM | POA: Diagnosis not present

## 2015-11-01 DIAGNOSIS — E1142 Type 2 diabetes mellitus with diabetic polyneuropathy: Secondary | ICD-10-CM | POA: Diagnosis not present

## 2015-11-01 DIAGNOSIS — Z23 Encounter for immunization: Secondary | ICD-10-CM | POA: Diagnosis not present

## 2015-11-01 DIAGNOSIS — I2510 Atherosclerotic heart disease of native coronary artery without angina pectoris: Secondary | ICD-10-CM | POA: Diagnosis not present

## 2015-11-01 LAB — COMPREHENSIVE METABOLIC PANEL
ALT: 32 U/L (ref 0–53)
AST: 32 U/L (ref 0–37)
Albumin: 4.3 g/dL (ref 3.5–5.2)
Alkaline Phosphatase: 37 U/L — ABNORMAL LOW (ref 39–117)
BUN: 18 mg/dL (ref 6–23)
CO2: 30 mEq/L (ref 19–32)
Calcium: 9.6 mg/dL (ref 8.4–10.5)
Chloride: 101 mEq/L (ref 96–112)
Creatinine, Ser: 0.87 mg/dL (ref 0.40–1.50)
GFR: 90.73 mL/min (ref >60.00)
Glucose, Bld: 136 mg/dL — ABNORMAL HIGH (ref 70–99)
Potassium: 4 mEq/L (ref 3.5–5.1)
Sodium: 138 mEq/L (ref 135–145)
Total Bilirubin: 1 mg/dL (ref 0.2–1.2)
Total Protein: 7.5 g/dL (ref 6.0–8.3)

## 2015-11-01 LAB — CBC
HCT: 42.3 % (ref 39.0–52.0)
Hemoglobin: 14.7 g/dL (ref 13.0–17.0)
MCHC: 34.7 g/dL (ref 30.0–36.0)
MCV: 87.2 fl (ref 78.0–100.0)
Platelets: 214 10*3/uL (ref 150.0–400.0)
RBC: 4.85 Mil/uL (ref 4.22–5.81)
RDW: 14.5 % (ref 11.5–15.5)
WBC: 6.5 10*3/uL (ref 4.0–10.5)

## 2015-11-01 LAB — LIPID PANEL
Cholesterol: 128 mg/dL (ref 0–200)
HDL: 39.9 mg/dL (ref >39.00)
LDL Cholesterol: 58 mg/dL (ref 0–99)
NonHDL: 88.39
Total CHOL/HDL Ratio: 3
Triglycerides: 151 mg/dL — ABNORMAL HIGH (ref 0.0–149.0)
VLDL: 30.2 mg/dL (ref 0.0–40.0)

## 2015-11-01 LAB — HEMOGLOBIN A1C: Hgb A1c MFr Bld: 6.6 % — ABNORMAL HIGH (ref 4.6–6.5)

## 2015-11-01 NOTE — Assessment & Plan Note (Signed)
Stable. Currently asymptomatic.  Continue current medications.

## 2015-11-01 NOTE — Patient Instructions (Addendum)
Continue your medications.   We will arrange your [place].  Dermatologist - [facility] - Nehemiah Massed  We will call with your lab results.  Congrats on quitting smoking.  Follow up in 6 months  Take care  Dr. Lacinda Axon

## 2015-11-01 NOTE — Assessment & Plan Note (Signed)
Well controlled. Continue Lisinopril, HCTZ, Metoprolol.

## 2015-11-01 NOTE — Assessment & Plan Note (Signed)
We'll schedule ultrasound.

## 2015-11-01 NOTE — Assessment & Plan Note (Signed)
Well controlled. Lipid panel today. Continue Lipitor.

## 2015-11-01 NOTE — Assessment & Plan Note (Signed)
Stable. Continue metformin. A1c today.

## 2015-11-01 NOTE — Progress Notes (Addendum)
Subjective:  Patient ID: [name] [name], male    DOB: 21-Jun-1940  Age: 75 y.o. MRN: [id number]  CC: Follow up  HPI:  75 year old male with hypertension, hypokalemia, history of melanoma, DM 2, carotid artery disease, CAD, AAA, BPH presents for follow-up.  HTN  Stable on Lisinopril, HCTZ, Metoprolol.  HLD  Well controlled on Lipitor.  Needs repeat labs.  CAD  Stable.  No chest pain or shortness of breath.  Compliant with aspirin, statin, ACEI and Beta blocker.  Carotid artery disease  Has not yet got [place] due to dental work.  Will arrange.  Tobacco abuse  Has now quit (10/17/15).  Happy and feeling well.  DM-2  Stable.   Compliant with Metformin.   Needs labs.  Social Hx   Social History   Social History  . Marital status: Married    Spouse name: N/A  . Number of children: N/A  . Years of education: N/A   Social History Main Topics  . Smoking status: Former Smoker    Types: Pipe  . Smokeless tobacco: Former Systems developer    Quit date: 10/17/2015  . Alcohol use 0.0 - 0.6 oz/week  . Drug use: No  . Sexual activity: Not Asked   Other Topics Concern  . None   Social History Narrative  . None   Review of Systems  Constitutional: Negative.   Respiratory: Negative.   Cardiovascular: Negative.    Objective:  BP 138/82 (BP Location: Left Arm, Patient Position: Sitting, Cuff Size: Normal)   Pulse 66   Temp 97.5 F (36.4 C) (Oral)   Wt 212 lb 2 oz (96.2 kg)   SpO2 94%   BMI 27.99 kg/m   BP/Weight 11/01/2015 10/17/2015 09/11/9369  Systolic BP 696 789 381  Diastolic BP 82 74 84  Wt. (Lbs) 212.13 209.25 210.6  BMI 27.99 27.61 27.79   Physical Exam  Constitutional: He is oriented to person, place, and time. He appears well-developed. No distress.  Cardiovascular: Normal rate and regular rhythm.   2/6 systolic murmur.  Pulmonary/Chest: Effort normal. He has no wheezes. He has no rales.  Neurological: He is alert and oriented to person, place, and  time.  Psychiatric: He has a normal mood and affect.  Vitals reviewed.  Lab Results  Component Value Date   WBC 6.3 12/19/2014   HGB 14.6 12/19/2014   HCT 42 12/19/2014   PLT 167 12/19/2014   CHOL 122 12/19/2014   TRIG 203 (A) 12/19/2014   HDL 39 12/19/2014   LDLCALC 42 12/19/2014   ALT 52 (A) 12/19/2014   AST 37 12/19/2014   NA 142 12/19/2014   K 4.0 12/19/2014   CREATININE 1.0 12/19/2014   BUN 19 12/19/2014   TSH 0.76 12/19/2014   HGBA1C 6.8 12/19/2014   Assessment & Plan:   Problem List Items Addressed This Visit    HTN (hypertension) - Primary    Well controlled. Continue Lisinopril, HCTZ, Metoprolol.       Relevant Orders   Comp Met (CMET)   HLD (hyperlipidemia)    Well controlled. Lipid panel today. Continue Lipitor.      Relevant Orders   Lipid Profile   DM type 2 (diabetes mellitus, type 2) ([place])    Stable. Continue metformin. A1c today.      Relevant Orders   HgB A1c   Carotid artery disease ([facility])    We'll schedule ultrasound.      CAD (coronary artery disease)    Stable. Currently asymptomatic.  Continue  current medications.      Relevant Orders   CBC   Comp Met (CMET)    Other Visit Diagnoses    Encounter for immunization       Relevant Orders   Flu vaccine HIGH DOSE PF (Completed)     Follow-up: Return in about 6 months (around 05/01/2016).  [place]

## 2015-11-01 NOTE — Progress Notes (Signed)
Pre visit review using our clinic review tool, if applicable. No additional management support is needed unless otherwise documented below in the visit note. 

## 2015-11-03 ENCOUNTER — Ambulatory Visit
Admission: RE | Admit: 2015-11-03 | Discharge: 2015-11-03 | Disposition: A | Discharge status: Home / Self Care | Payer: Medicare Other | Source: Ambulatory Visit | Attending: Family Medicine | Admitting: Family Medicine

## 2015-11-03 DIAGNOSIS — I779 Disorder of arteries and arterioles, unspecified: Secondary | ICD-10-CM

## 2015-11-03 DIAGNOSIS — I6529 Occlusion and stenosis of unspecified carotid artery: Secondary | ICD-10-CM | POA: Insufficient documentation

## 2015-11-03 DIAGNOSIS — I739 Peripheral vascular disease, unspecified: Secondary | ICD-10-CM

## 2015-11-03 DIAGNOSIS — I6523 Occlusion and stenosis of bilateral carotid arteries: Secondary | ICD-10-CM | POA: Diagnosis not present

## 2015-11-16 ENCOUNTER — Other Ambulatory Visit: Payer: Self-pay | Admitting: Family Medicine

## 2015-12-12 ENCOUNTER — Other Ambulatory Visit: Payer: Self-pay | Admitting: Family Medicine

## 2015-12-15 ENCOUNTER — Other Ambulatory Visit: Payer: Self-pay | Admitting: Family Medicine

## 2015-12-15 MED ORDER — TAMSULOSIN HCL 0.4 MG PO CAPS: 0.4000 mg | ORAL_CAPSULE | Freq: Every day | ORAL | 3 refills | Status: DC

## 2015-12-15 MED ORDER — ATORVASTATIN CALCIUM 80 MG PO TABS: 80.0000 mg | ORAL_TABLET | Freq: Every day | ORAL | 3 refills | Status: DC

## 2015-12-15 MED ORDER — TRAMADOL HCL 50 MG PO TABS: 50.0000 mg | ORAL_TABLET | Freq: Four times a day (QID) | ORAL | 0 refills | Status: DC | PRN

## 2015-12-15 NOTE — Telephone Encounter (Signed)
Historical medication. Pt last seen 11/01/15

## 2015-12-15 NOTE — Telephone Encounter (Signed)
Faxed

## 2015-12-15 NOTE — Telephone Encounter (Signed)
Pt needs refills on the following sent to [place].[address]  -traMADol (ULTRAM) 50 MG tablet -tamsulosin (FLOMAX) 0.4 MG CAPS capsule -atorvastatin (LIPITOR) 80 MG tablet

## 2015-12-18 ENCOUNTER — Telehealth: Payer: Self-pay | Admitting: Family Medicine

## 2015-12-18 NOTE — Telephone Encounter (Signed)
Called pt and stated that he had gotten medication from pharmacy.

## 2015-12-18 NOTE — Telephone Encounter (Signed)
Pt lvm asking for refills of three medications. He states that he has called [facility] and they told him that they have faxed over a notification to [place] but we have not responded. He states that his doctor in [place] rx'd these last-Dr. Camila Li Helm[facility]. He needs his tramadol, atorvastatin and his tamsulosin refilled. Please cb pt once sent. [phone]

## 2016-01-26 ENCOUNTER — Other Ambulatory Visit: Payer: Self-pay | Admitting: Family Medicine

## 2016-01-26 NOTE — Telephone Encounter (Signed)
Refilled 12/15/15. Pt last seen 11/01/15. Please advise?

## 2016-01-26 NOTE — Telephone Encounter (Signed)
faxed

## 2016-01-29 ENCOUNTER — Other Ambulatory Visit: Payer: Self-pay | Admitting: Family Medicine

## 2016-01-29 NOTE — Telephone Encounter (Signed)
Historical medication. Pts a1c was at goal at labs in 10/2015. Please advise?

## 2016-02-27 ENCOUNTER — Other Ambulatory Visit: Payer: Self-pay | Admitting: Family Medicine

## 2016-03-11 ENCOUNTER — Other Ambulatory Visit: Payer: Self-pay | Admitting: Family Medicine

## 2016-03-11 NOTE — Telephone Encounter (Signed)
Refilled 01/26/16. Pt last seen 11/01/15. Please advise?

## 2016-03-12 NOTE — Telephone Encounter (Signed)
faxed

## 2016-03-28 ENCOUNTER — Ambulatory Visit (INDEPENDENT_AMBULATORY_CARE_PROVIDER_SITE_OTHER): Payer: Medicare Other | Admitting: Cardiology

## 2016-03-28 ENCOUNTER — Encounter: Payer: Self-pay | Admitting: Cardiology

## 2016-03-28 VITALS — BP 122/70 | HR 63 | Ht 73.0 in | Wt 217.2 lb

## 2016-03-28 DIAGNOSIS — Z9861 Coronary angioplasty status: Secondary | ICD-10-CM | POA: Diagnosis not present

## 2016-03-28 DIAGNOSIS — I2510 Atherosclerotic heart disease of native coronary artery without angina pectoris: Secondary | ICD-10-CM | POA: Diagnosis not present

## 2016-03-28 DIAGNOSIS — E784 Other hyperlipidemia: Secondary | ICD-10-CM | POA: Diagnosis not present

## 2016-03-28 DIAGNOSIS — E7849 Other hyperlipidemia: Secondary | ICD-10-CM

## 2016-03-28 DIAGNOSIS — I700 Atherosclerosis of aorta: Secondary | ICD-10-CM | POA: Diagnosis not present

## 2016-03-28 DIAGNOSIS — I10 Essential (primary) hypertension: Secondary | ICD-10-CM | POA: Diagnosis not present

## 2016-03-28 NOTE — Patient Instructions (Signed)
Follow-Up: Your physician wants you to follow-up in: 6 months. You will receive a reminder letter in the mail two months in advance. If you don't receive a letter, please call our office to schedule the follow-up appointment.  It was a pleasure seeing you today here in the office. Please do not hesitate to give us a call back if you have any further questions. [phone]  Pamela A. RN, BSN     

## 2016-03-28 NOTE — Progress Notes (Signed)
Cardiology Office Note   Date:  03/28/2016   ID:  [name] [name], DOB [birth year], MRN [id number]  Referring Doctor:  Coral Spikes, DO   Cardiologist:   Wende Bushy, MD   Reason for consultation:  Chief Complaint  Patient presents with  . OTHER    6 month f/u no complaints today. Meds reviewed verbally with pt.      History of Present Illness: [name] [name] is a 76 y.o. male who presents Follow-up for history of CAD   Review of records show: Patient moved from [place]. He has a history of CAD status post MI. Last stenting was from 2005 per medical records that patient brought with him. When he had the MI, his symptoms were nausea and jaw pain. He had no chest pain or shortness of breath.   Since last visit, patient has been doing quite well. No chest pain or shortness of breath. He admits to some weight gain from last visit. He reports that due to whether he has not been that active. He reports a blood pressure is well controlled at home. He is compliant with his medications.  ROS:  Please see the history of present illness. Aside from mentioned under HPI, all other systems are reviewed and negative.    Past Medical History:  Diagnosis Date  . Arthritis   . CAD (coronary artery disease)   . Chicken pox   . Colon polyps    4 pre-cancerous   . Diverticulitis   . DM type 2 (diabetes mellitus, type 2) ([place])   . Hyperlipidemia   . Hypertension   . Kidney stones   . Melanoma [facility])     Past Surgical History:  Procedure Laterality Date  . CHOLECYSTECTOMY  2012  . CORONARY ANGIOPLASTY WITH STENT PLACEMENT  1991 & 2005  . LITHOTRIPSY  2015  . MELANOMA EXCISION  1980   malignant  . NERVE SURGERY  2015   ulna nerve  . TONSILLECTOMY  1945  . WISDOM TOOTH EXTRACTION       reports that he has quit smoking. His smoking use included Pipe. He quit smokeless tobacco use about 5 months ago. He reports that he drinks alcohol. He reports that he does not use drugs.    family history includes Colon cancer in his father; Diabetes in his father and mother; Heart disease in his mother; Hyperlipidemia in his mother; Hypertension in his mother; Kidney cancer in his father; Lung cancer in his father.   Outpatient Medications Prior to Visit  Medication Sig Dispense Refill  . aspirin EC 81 MG tablet Take 81 mg by mouth daily.    [name] atorvastatin (LIPITOR) 80 MG tablet Take 1 tablet (80 mg total) by mouth daily. 90 tablet 3  . clonazePAM (KLONOPIN) 0.5 MG tablet Take 0.5 mg by mouth 2 (two) times daily as needed for anxiety.    . fenofibrate 160 MG tablet TAKE 1 TABLET BY MOUTH EVERY DAY 90 tablet 3  . fluticasone (FLONASE) 50 MCG/ACT nasal spray Place 2 sprays into both nostrils daily. 16 g 6  . hydrochlorothiazide (HYDRODIURIL) 25 MG tablet TAKE 1 TABLET BY MOUTH EVERY DAY 90 tablet 3  . lisinopril (PRINIVIL,ZESTRIL) 10 MG tablet Take 10 mg by mouth daily.    . metFORMIN (GLUMETZA) 500 MG (MOD) 24 hr tablet Take 500 mg by mouth 2 (two) times daily with a meal.     . metoprolol succinate (TOPROL-XL) 25 MG 24 hr tablet TAKE 1/2 TABLET BY  MOUTH TWICE DAILY 90 tablet 3  . tamsulosin (FLOMAX) 0.4 MG CAPS capsule Take 1 capsule (0.4 mg total) by mouth daily. 90 capsule 3  . traMADol (ULTRAM) 50 MG tablet TAKE 1 TABLET BY MOUTH EVERY 6 HOURS AS NEEDED 90 tablet 0  . metFORMIN (GLUCOPHAGE-XR) 500 MG 24 hr tablet TAKE 2 TABLETS BY MOUTH EVERY DAY (Patient not taking: Reported on 03/28/2016) 180 tablet 1   No facility-administered medications prior to visit.      Allergies: Azithromycin    PHYSICAL EXAM: VS:  BP 122/70 (BP Location: Left Arm, Patient Position: Sitting, Cuff Size: Normal)   Pulse 63   Ht 6\' 1"  (1.854 m)   Wt 217 lb 4 oz (98.5 kg)   BMI 28.66 kg/m  , Body mass index is 28.66 kg/m. Wt Readings from Last 3 Encounters:  03/28/16 217 lb 4 oz (98.5 kg)  11/01/15 212 lb 2 oz (96.2 kg)  10/17/15 209 lb 4 oz (94.9 kg)    GENERAL:  well developed, well  nourished, obese, not in acute distress HEENT: normocephalic, pink conjunctivae, anicteric sclerae, no xanthelasma, normal dentition, oropharynx clear NECK:  no neck vein engorgement, JVP normal, no hepatojugular reflux, carotid upstroke brisk and symmetric, no bruit, no thyromegaly, no lymphadenopathy LUNGS:  good respiratory effort, clear to auscultation bilaterally CV:  PMI not displaced, no thrills, no lifts, S1 and S2 within normal limits, no palpable S3 or S4, no murmurs, no rubs, no gallops ABD:  Soft, nontender, nondistended, normoactive bowel sounds, no abdominal aortic bruit, no hepatomegaly, no splenomegaly MS: nontender back, no kyphosis, no scoliosis, no joint deformities EXT:  2+ DP/PT pulses, no edema, no varicosities, no cyanosis, no clubbing SKIN: warm, nondiaphoretic, normal turgor, no ulcers NEUROPSYCH: alert, oriented to person, place, and time, sensory/motor grossly intact, normal mood, appropriate affect    Recent Labs: 11/01/2015: ALT 32; BUN 18; Creatinine, Ser 0.87; Hemoglobin 14.7; Platelets 214.0; Potassium 4.0; Sodium 138   Lipid Panel    Component Value Date/Time   CHOL 128 11/01/2015 0903   TRIG 151.0 (H) 11/01/2015 0903   HDL 39.90 11/01/2015 0903   CHOLHDL 3 11/01/2015 0903   VLDL 30.2 11/01/2015 0903   LDLCALC 58 11/01/2015 0903     Other studies Reviewed:  EKG:  The ekg from 08/15/2015 was personally reviewed by me and it revealed sinus bradycardia, 55 BPM. Frequent PCC's. Nonspecific IVCD. Nonspecific ST-T wave changes/T-wave inversion.  Additional studies/ records that were reviewed personally reviewed by me today include: Lexiscan stress test 03/20/2010: Old inferior lateral MI. Slight hypokinesia effort and inferolateral wall but EF is 60%. No ongoing ischemia.  Review of outside records: Pharmacologic nuclear stress test 03/26/2013,Lexiscan Old inferior MI. No ischemia. Well-preserved LVEF 50%. Slight hypokinesia of the inferior  wall.  Echo 03/26/2013: LV borderline dilated. EF 50-55%. Mild global hypokinesis of LV. LA is moderately dilated. RV mildly dilated. Mild to moderate aortic sclerosis. Mild AI. Mild to moderate mitral valve thickening. Mild to moderate MR. RVSP 31 mmHg. Mild TR. Aortic root is normal in size. No pericardial effusion.   ASSESSMENT AND PLAN: CAD status post MI. Status post stent 2005 No reports of angina or shortness of breath.  Continue medical therapy: Aspirin, beta blocker, statin, lisinopril. Patient aware of the PVCs but he is not bothered by them.  Hypertension BP is well controlled. Continue monitoring BP. Continue current medical therapy and lifestyle changes.  Hyperlipidemia LDL goal less than 70 due to CAD and diabetes. PCP following labs.  Abdominal aorta atherosclerosis Recommendations remain the same from last visit 08/15/2015:  PCP ordered an ultrasound of the abdomen and this showed: abdominal aortic atherosclerosis with minor ectasia, diameter of 2.9 cm. Recommend ultrasound 5 years. Patient is already on treatment for CAD with beta blocker and statin therapy.   Current medicines are reviewed at length with the patient today.  The patient does not have concerns regarding medicines.  Labs/ tests ordered today include:  Orders Placed This Encounter  Procedures  . EKG 12-Lead    I had a lengthy and detailed discussion with the patient regarding diagnoses, prognosis, diagnostic options, treatment options .   I counseled the patient on importance of lifestyle modification including heart healthy diet, regular physical activity .   Disposition:   FU with undersigned in 6 months  I spent at least 25 minutes with the patient today and more than 50% of the time was spent counseling the patient and coordinating care.      Signed, Wende Bushy, MD  03/28/2016 4:58 PM    [place]  This note was generated in part with voice recognition  software and I apologize for any typographical errors that were not detected and corrected.

## 2016-04-10 ENCOUNTER — Other Ambulatory Visit: Payer: Self-pay | Admitting: Family Medicine

## 2016-04-22 ENCOUNTER — Ambulatory Visit (INDEPENDENT_AMBULATORY_CARE_PROVIDER_SITE_OTHER): Payer: Medicare Other | Admitting: Family Medicine

## 2016-04-22 ENCOUNTER — Encounter: Payer: Self-pay | Admitting: Family Medicine

## 2016-04-22 ENCOUNTER — Other Ambulatory Visit: Payer: Self-pay | Admitting: Family Medicine

## 2016-04-22 DIAGNOSIS — I2510 Atherosclerotic heart disease of native coronary artery without angina pectoris: Secondary | ICD-10-CM

## 2016-04-22 DIAGNOSIS — J988 Other specified respiratory disorders: Secondary | ICD-10-CM | POA: Insufficient documentation

## 2016-04-22 MED ORDER — DOXYCYCLINE HYCLATE 100 MG PO TABS: 100.0000 mg | ORAL_TABLET | Freq: Two times a day (BID) | ORAL | 0 refills | Status: DC

## 2016-04-22 MED ORDER — CLONAZEPAM 0.5 MG PO TABS: 0.5000 mg | ORAL_TABLET | Freq: Two times a day (BID) | ORAL | 1 refills | Status: DC | PRN

## 2016-04-22 MED ORDER — TRAMADOL HCL 50 MG PO TABS: 50.0000 mg | ORAL_TABLET | Freq: Four times a day (QID) | ORAL | 0 refills | Status: DC | PRN

## 2016-04-22 MED ORDER — GLUCOSE BLOOD VI STRP: ORAL_STRIP | 12 refills | Status: DC

## 2016-04-22 NOTE — Patient Instructions (Signed)
Antibiotic twice daily for 7 days.  Take care  Dr. Lacinda Axon

## 2016-04-22 NOTE — Progress Notes (Signed)
Pre visit review using our clinic review tool, if applicable. No additional management support is needed unless otherwise documented below in the visit note. 

## 2016-04-22 NOTE — Progress Notes (Signed)
Subjective:  Patient ID: [name] [name], male    DOB: 03-Aug-1940  Age: 76 y.o. MRN: [id number]  CC: Sinus pressure, drainage, cough  HPI:  76 year old male with DM 2, CAD, hypertension, hyperlipidemia presents with the above complaints.  Patient states he's been sick for the past 2 weeks. He's had sinus pressure and congestion. He's had associated productive cough and postnasal drip. No associated fevers or chills. He's been taking Coricidin without significant improvement. No known exacerbating factors. No other associated symptoms. No other complaints at this time.  Social Hx   Social History   Social History  . Marital status: Married    Spouse name: N/A  . Number of children: N/A  . Years of education: N/A   Social History Main Topics  . Smoking status: Former Smoker    Types: Pipe  . Smokeless tobacco: Former Systems developer    Quit date: 10/17/2015  . Alcohol use 0.0 - 0.6 oz/week  . Drug use: No  . Sexual activity: Not Asked   Other Topics Concern  . None   Social History Narrative  . None   Review of Systems  Constitutional: Negative for fever.  HENT: Positive for congestion, postnasal drip, sinus pain and sinus pressure.   Respiratory: Positive for cough.    Objective:  BP 132/82   Pulse (!) 56   Temp 97.7 F (36.5 C) (Oral)   Wt 213 lb 2 oz (96.7 kg)   SpO2 94%   BMI 28.12 kg/m   BP/Weight 04/22/2016 03/28/2016 65/99/3570  Systolic BP 177 939 030  Diastolic BP 82 70 82  Wt. (Lbs) 213.13 217.25 212.13  BMI 28.12 28.66 27.99   Physical Exam  Constitutional: He is oriented to person, place, and time. He appears well-developed. No distress.  HENT:  Oropharynx clear. Normal TM's bilaterally.  Mild maxillary sinus tenderness to palpation.  Cardiovascular: Normal rate and regular rhythm.   Pulmonary/Chest: Effort normal and breath sounds normal.  Neurological: He is alert and oriented to person, place, and time.  Psychiatric: He has a normal mood and affect.    Vitals reviewed.   Lab Results  Component Value Date   WBC 6.5 11/01/2015   HGB 14.7 11/01/2015   HCT 42.3 11/01/2015   PLT 214.0 11/01/2015   GLUCOSE 136 (H) 11/01/2015   CHOL 128 11/01/2015   TRIG 151.0 (H) 11/01/2015   HDL 39.90 11/01/2015   LDLCALC 58 11/01/2015   ALT 32 11/01/2015   AST 32 11/01/2015   NA 138 11/01/2015   K 4.0 11/01/2015   CL 101 11/01/2015   CREATININE 0.87 11/01/2015   BUN 18 11/01/2015   CO2 30 11/01/2015   TSH 0.76 12/19/2014   HGBA1C 6.6 (H) 11/01/2015    Assessment & Plan:   Problem List Items Addressed This Visit    Respiratory infection    New problem. Given duration of illness, treating with doxycycline.         Meds ordered this encounter  Medications  . clonazePAM (KLONOPIN) 0.5 MG tablet    Sig: Take 1 tablet (0.5 mg total) by mouth 2 (two) times daily as needed for anxiety.    Dispense:  30 tablet    Refill:  1  . traMADol (ULTRAM) 50 MG tablet    Sig: Take 1 tablet (50 mg total) by mouth every 6 (six) hours as needed.    Dispense:  90 tablet    Refill:  0  . glucose blood test strip    Sig:  Use as instructed    Dispense:  100 each    Refill:  12    Contour blood glucose strips  . doxycycline (VIBRA-TABS) 100 MG tablet    Sig: Take 1 tablet (100 mg total) by mouth 2 (two) times daily.    Dispense:  14 tablet    Refill:  0    Follow-up: PRN  [place]

## 2016-04-22 NOTE — Assessment & Plan Note (Signed)
New problem. Given duration of illness, treating with doxycycline.

## 2016-05-01 ENCOUNTER — Encounter: Payer: Self-pay | Admitting: Family Medicine

## 2016-05-01 ENCOUNTER — Ambulatory Visit (INDEPENDENT_AMBULATORY_CARE_PROVIDER_SITE_OTHER): Payer: Medicare Other | Admitting: Family Medicine

## 2016-05-01 ENCOUNTER — Telehealth: Payer: Self-pay | Admitting: Family Medicine

## 2016-05-01 VITALS — BP 130/78 | HR 56 | Temp 97.6°F | Wt 213.1 lb

## 2016-05-01 DIAGNOSIS — I2510 Atherosclerotic heart disease of native coronary artery without angina pectoris: Secondary | ICD-10-CM

## 2016-05-01 DIAGNOSIS — I10 Essential (primary) hypertension: Secondary | ICD-10-CM | POA: Diagnosis not present

## 2016-05-01 DIAGNOSIS — E782 Mixed hyperlipidemia: Secondary | ICD-10-CM

## 2016-05-01 DIAGNOSIS — E1142 Type 2 diabetes mellitus with diabetic polyneuropathy: Secondary | ICD-10-CM | POA: Diagnosis not present

## 2016-05-01 LAB — COMPREHENSIVE METABOLIC PANEL
ALT: 32 U/L (ref 0–53)
AST: 34 U/L (ref 0–37)
Albumin: 4 g/dL (ref 3.5–5.2)
Alkaline Phosphatase: 37 U/L — ABNORMAL LOW (ref 39–117)
BUN: 19 mg/dL (ref 6–23)
CO2: 25 mEq/L (ref 19–32)
Calcium: 9.7 mg/dL (ref 8.4–10.5)
Chloride: 103 mEq/L (ref 96–112)
Creatinine, Ser: 0.87 mg/dL (ref 0.40–1.50)
GFR: 90.61 mL/min (ref >60.00)
Glucose, Bld: 234 mg/dL — ABNORMAL HIGH (ref 70–99)
Potassium: 3.7 mEq/L (ref 3.5–5.1)
Sodium: 137 mEq/L (ref 135–145)
Total Bilirubin: 1 mg/dL (ref 0.2–1.2)
Total Protein: 6.8 g/dL (ref 6.0–8.3)

## 2016-05-01 LAB — HEMOGLOBIN A1C: Hgb A1c MFr Bld: 7.3 % — ABNORMAL HIGH (ref 4.6–6.5)

## 2016-05-01 LAB — LIPID PANEL
Cholesterol: 103 mg/dL (ref 0–200)
HDL: 34.1 mg/dL — ABNORMAL LOW (ref >39.00)
NonHDL: 69
Total CHOL/HDL Ratio: 3
Triglycerides: 210 mg/dL — ABNORMAL HIGH (ref 0.0–149.0)
VLDL: 42 mg/dL — ABNORMAL HIGH (ref 0.0–40.0)

## 2016-05-01 LAB — LDL CHOLESTEROL, DIRECT: Direct LDL: 49 mg/dL

## 2016-05-01 NOTE — Progress Notes (Signed)
   Subjective:  Patient ID: [name] [name], male    DOB: June 26, 1940  Age: 76 y.o. MRN: [id number]  CC: Follow up  HPI:  76 year old male with hypertension, hyperlipidemia, DM 2, carotid artery disease, coronary artery disease, atherosclerosis of the aorta presents for follow-up.  HTN  At goal on Lisinopril/HCTZ, Metoprolol.   HLD  Stable on Lipitor and fenofibrate.  DM-2  At goal on metformin.  Having trouble with diarrhea.  Will discuss today.  Social Hx   Social History   Social History  . Marital status: Married    Spouse name: N/A  . Number of children: N/A  . Years of education: N/A   Social History Main Topics  . Smoking status: Former Smoker    Types: Pipe  . Smokeless tobacco: Former Systems developer    Quit date: 10/17/2015  . Alcohol use 0.0 - 0.6 oz/week  . Drug use: No  . Sexual activity: Not Asked   Other Topics Concern  . None   Social History Narrative  . None    Review of Systems  Constitutional: Negative.   HENT: Positive for congestion.    Objective:  BP 130/78   Pulse (!) 56   Temp 97.6 F (36.4 C) (Oral)   Wt 213 lb 2 oz (96.7 kg)   SpO2 96%   BMI 28.12 kg/m   BP/Weight 05/01/2016 04/22/2016 3/88/8280  Systolic BP 034 917 915  Diastolic BP 78 82 70  Wt. (Lbs) 213.13 213.13 217.25  BMI 28.12 28.12 28.66   Physical Exam  Constitutional: He is oriented to person, place, and time. He appears well-developed. No distress.  Cardiovascular: Normal rate and regular rhythm.   Pulmonary/Chest: Effort normal and breath sounds normal.  Neurological: He is alert and oriented to person, place, and time.  Psychiatric: He has a normal mood and affect.  Vitals reviewed.   Lab Results  Component Value Date   WBC 6.5 11/01/2015   HGB 14.7 11/01/2015   HCT 42.3 11/01/2015   PLT 214.0 11/01/2015   GLUCOSE 136 (H) 11/01/2015   CHOL 128 11/01/2015   TRIG 151.0 (H) 11/01/2015   HDL 39.90 11/01/2015   LDLCALC 58 11/01/2015   ALT 32 11/01/2015   AST 32 11/01/2015   NA 138 11/01/2015   K 4.0 11/01/2015   CL 101 11/01/2015   CREATININE 0.87 11/01/2015   BUN 18 11/01/2015   CO2 30 11/01/2015   TSH 0.76 12/19/2014   HGBA1C 6.6 (H) 11/01/2015    Assessment & Plan:   Problem List Items Addressed This Visit    Mixed hyperlipidemia    Stable. Continue Lipitor and Fenofibrate.      Relevant Orders   Lipid panel   Essential hypertension - Primary    At goal. Continue lisinopril, HCTZ, Metoprolol.       Relevant Orders   Comprehensive metabolic panel   DM type 2 (diabetes mellitus, type 2) ([place])    At goal.  Labs today. Discussed stopping metformin given side effects and patient wishes to continue at this time.      Relevant Orders   Hemoglobin A1c     Follow-up: Return in about 6 months (around 10/31/2016).  [place]

## 2016-05-01 NOTE — Telephone Encounter (Signed)
Pt called wanting to let you know that his colonoscopy was 04/12/15.

## 2016-05-01 NOTE — Assessment & Plan Note (Signed)
Stable. Continue Lipitor and Fenofibrate.

## 2016-05-01 NOTE — Progress Notes (Signed)
Pre visit review using our clinic review tool, if applicable. No additional management support is needed unless otherwise documented below in the visit note. 

## 2016-05-01 NOTE — Patient Instructions (Signed)
Continue your meds.  Follow up in 6 months.  Call [facility] with the info (Ext [phone])  Take care  Dr. Lacinda Axon

## 2016-05-01 NOTE — Assessment & Plan Note (Signed)
At goal. Continue lisinopril, HCTZ, Metoprolol.

## 2016-05-01 NOTE — Assessment & Plan Note (Signed)
At goal.  Labs today. Discussed stopping metformin given side effects and patient wishes to continue at this time.

## 2016-05-15 MED ORDER — GLUCOSE BLOOD VI STRP: ORAL_STRIP | 12 refills | Status: DC

## 2016-05-15 NOTE — Addendum Note (Signed)
Addended by: Carmin Muskrat on: 05/15/2016 11:54 AM   Modules accepted: Orders

## 2016-05-16 ENCOUNTER — Encounter: Payer: Self-pay | Admitting: Family Medicine

## 2016-05-16 ENCOUNTER — Ambulatory Visit (INDEPENDENT_AMBULATORY_CARE_PROVIDER_SITE_OTHER): Payer: Medicare Other | Admitting: Family Medicine

## 2016-05-16 DIAGNOSIS — E1142 Type 2 diabetes mellitus with diabetic polyneuropathy: Secondary | ICD-10-CM

## 2016-05-16 DIAGNOSIS — I2510 Atherosclerotic heart disease of native coronary artery without angina pectoris: Secondary | ICD-10-CM | POA: Diagnosis not present

## 2016-05-16 NOTE — Assessment & Plan Note (Signed)
A1c and blood sugars have been worsening. We discussed dietary changes and exercise versus adding additional medication. Patient elected for the former. He will continue metformin and will watch his diet carefully. He will continue to walk/exercise regularly.

## 2016-05-16 NOTE — Progress Notes (Signed)
Pre visit review using our clinic review tool, if applicable. No additional management support is needed unless otherwise documented below in the visit note. 

## 2016-05-16 NOTE — Progress Notes (Signed)
Subjective:  Patient ID: [name] [name], male    DOB: 03-08-1940  Age: 76 y.o. MRN: [id number]  CC: Elevated blood sugars  HPI:  76 year old male with an extensive past medical history including type 2 diabetes presents with the above complaint.  Patient has had a recent A1c in April which was 7.3. As a result, he has been checking his sugars more vigilantly. He states that his blood sugars are ranging from the 120s to 181. His highest blood sugar was postprandial. He is quite worried about his blood sugars. He has been trying to watch his diet and has started to exercise. He endorses compliance with metformin. He would like to discuss the need for and potential treatment options regarding his diabetes today.  Social Hx   Social History   Social History  . Marital status: Married    Spouse name: N/A  . Number of children: N/A  . Years of education: N/A   Social History Main Topics  . Smoking status: Former Smoker    Types: Pipe  . Smokeless tobacco: Former Systems developer    Quit date: 10/17/2015  . Alcohol use 0.0 - 0.6 oz/week  . Drug use: No  . Sexual activity: Not Asked   Other Topics Concern  . None   Social History Narrative  . None    Review of Systems  Constitutional: Negative.   Endocrine:       Elevated blood sugars.   Objective:  BP 132/80   Pulse (!) 57   Temp 97.8 F (36.6 C) (Oral)   Wt 209 lb 8 oz (95 kg)   SpO2 94%   BMI 27.64 kg/m   BP/Weight 05/16/2016 1/75/1025 08/18/2776  Systolic BP 242 353 614  Diastolic BP 80 78 82  Wt. (Lbs) 209.5 213.13 213.13  BMI 27.64 28.12 28.12   Physical Exam  Constitutional: He is oriented to person, place, and time. He appears well-developed. No distress.  HENT:  Head: Normocephalic and atraumatic.  Eyes: Pupils are equal, round, and reactive to light.  Pulmonary/Chest: Effort normal.  Neurological: He is alert and oriented to person, place, and time.  Psychiatric: He has a normal mood and affect.  Vitals  reviewed.  Lab Results  Component Value Date   WBC 6.5 11/01/2015   HGB 14.7 11/01/2015   HCT 42.3 11/01/2015   PLT 214.0 11/01/2015   GLUCOSE 234 (H) 05/01/2016   CHOL 103 05/01/2016   TRIG 210.0 (H) 05/01/2016   HDL 34.10 (L) 05/01/2016   LDLDIRECT 49.0 05/01/2016   LDLCALC 58 11/01/2015   ALT 32 05/01/2016   AST 34 05/01/2016   NA 137 05/01/2016   K 3.7 05/01/2016   CL 103 05/01/2016   CREATININE 0.87 05/01/2016   BUN 19 05/01/2016   CO2 25 05/01/2016   TSH 0.76 12/19/2014   HGBA1C 7.3 (H) 05/01/2016    Assessment & Plan:   Problem List Items Addressed This Visit    DM type 2 (diabetes mellitus, type 2) ([place])    A1c and blood sugars have been worsening. We discussed dietary changes and exercise versus adding additional medication. Patient elected for the former. He will continue metformin and will watch his diet carefully. He will continue to walk/exercise regularly.      Relevant Medications   metFORMIN (GLUCOPHAGE-XR) 500 MG 24 hr tablet     Meds ordered this encounter  Medications  . metFORMIN (GLUCOPHAGE-XR) 500 MG 24 hr tablet    Sig: Take 500 mg by mouth 2 (  two) times daily.    Follow-up: Return in about 3 months (around 08/16/2016).  [place]

## 2016-06-04 ENCOUNTER — Other Ambulatory Visit: Payer: Self-pay | Admitting: Family Medicine

## 2016-06-04 NOTE — Telephone Encounter (Signed)
Script faxed to [facility]

## 2016-06-04 NOTE — Telephone Encounter (Signed)
Last OV was 05/16/2016, Last refill was 04/22/2016, #90 with no refills, please advise, thanks

## 2016-06-12 DIAGNOSIS — L821 Other seborrheic keratosis: Secondary | ICD-10-CM | POA: Diagnosis not present

## 2016-06-12 DIAGNOSIS — Z85820 Personal history of malignant melanoma of skin: Secondary | ICD-10-CM | POA: Diagnosis not present

## 2016-06-12 DIAGNOSIS — X32XXXA Exposure to sunlight, initial encounter: Secondary | ICD-10-CM | POA: Diagnosis not present

## 2016-06-12 DIAGNOSIS — D225 Melanocytic nevi of trunk: Secondary | ICD-10-CM | POA: Diagnosis not present

## 2016-06-12 DIAGNOSIS — D2272 Melanocytic nevi of left lower limb, including hip: Secondary | ICD-10-CM | POA: Diagnosis not present

## 2016-06-12 DIAGNOSIS — L570 Actinic keratosis: Secondary | ICD-10-CM | POA: Diagnosis not present

## 2016-06-20 ENCOUNTER — Encounter: Payer: Self-pay | Admitting: Family Medicine

## 2016-06-20 ENCOUNTER — Ambulatory Visit (INDEPENDENT_AMBULATORY_CARE_PROVIDER_SITE_OTHER): Payer: Medicare Other | Admitting: Family Medicine

## 2016-06-20 DIAGNOSIS — W57XXXA Bitten or stung by nonvenomous insect and other nonvenomous arthropods, initial encounter: Secondary | ICD-10-CM | POA: Diagnosis not present

## 2016-06-20 DIAGNOSIS — R220 Localized swelling, mass and lump, head: Secondary | ICD-10-CM | POA: Diagnosis not present

## 2016-06-20 DIAGNOSIS — S30860A Insect bite (nonvenomous) of lower back and pelvis, initial encounter: Secondary | ICD-10-CM

## 2016-06-20 DIAGNOSIS — I2510 Atherosclerotic heart disease of native coronary artery without angina pectoris: Secondary | ICD-10-CM

## 2016-06-20 DIAGNOSIS — R1031 Right lower quadrant pain: Secondary | ICD-10-CM

## 2016-06-20 NOTE — Assessment & Plan Note (Signed)
New problem. Abdominal exam reassuring. Patient is having ongoing diarrhea. Stopping metformin. Patient will monitor his sugars closely. He does not want additional pharmacotherapy at this time.

## 2016-06-20 NOTE — Assessment & Plan Note (Signed)
New problem. Unremarkable exam. No evidence of tick borne illness. Reassurance provided.

## 2016-06-20 NOTE — Assessment & Plan Note (Signed)
New problem. Clinically does not feel like a lymph node although this is quite possible. Feels more like a cyst or lipoma. Advise watchful waiting.

## 2016-06-20 NOTE — Progress Notes (Signed)
Subjective:  Patient ID: [name] [name], male    DOB: [birth year]  Age: 76 y.o. MRN: [id number]  CC: Abdominal pain, diarrhea; Tick bite; knot under jaw  HPI:  76 year old male with a collocated past medical history including hypertension, coronary artery disease, atherosclerosis of the aorta, DM 2, hyperlipidemia presents with the above complaints.  Abdominal pain/diarrhea  Patient reports he's had intermittent abdominal pain for the past 3 weeks.  He's had ongoing issues with diarrhea.  Endorses right lower quadrant pain. Mild to moderate in severity. Intermittent. No associated dysuria. No hematochezia or melena.  He feels that this may be worsened by metformin.  No other associated symptoms. No known relieving factors.  Tick bite   Patient reports recent tick bite.   He's had no fever or chills. No rash. No other associated symptoms.  No redness.  He wants this examined today.   Knot under jaw   Noticed just this morning.  Located on the right side of the mandible.  No surrounding erythema.  Nontender to palpation.  Is not particularly bothersome, he just noticed it with palpation.  He has had some recent dental work and ongoing sinus issues.  No other associated symptoms. No other complaints this time.   Social Hx   Social History   Social History  . Marital status: Married    Spouse name: N/A  . Number of children: N/A  . Years of education: N/A   Social History Main Topics  . Smoking status: Former Smoker    Types: Pipe  . Smokeless tobacco: Former Systems developer    Quit date: 10/17/2015  . Alcohol use 0.0 - 0.6 oz/week  . Drug use: No  . Sexual activity: Not Asked   Other Topics Concern  . None   Social History Narrative  . None    Review of Systems  HENT:       Knot under jaw.   Gastrointestinal: Positive for abdominal pain and diarrhea.   Objective:  BP 124/70 (BP Location: Left Arm, Patient Position: Sitting, Cuff Size: Normal)   Pulse  62   Temp 98.2 F (36.8 C) (Oral)   Resp 16   Wt 207 lb 2 oz (94 kg)   SpO2 95%   BMI 27.33 kg/m   BP/Weight 06/20/2016 05/16/2016 07/11/3149  Systolic BP 761 607 371  Diastolic BP 70 80 78  Wt. (Lbs) 207.13 209.5 213.13  BMI 27.33 27.64 28.12   Physical Exam  Constitutional: He is oriented to person, place, and time. He appears well-developed. No distress.  Neck:  Small, 1cm palpable nodule noted in the submandibular region (R).  Cardiovascular: Normal rate and regular rhythm.   Murmur heard. Pulmonary/Chest: Effort normal and breath sounds normal. He has no wheezes. He has no rales.  Abdominal: Soft.  Nondistended. No significant right lower quadrant tenderness.  Neurological: He is alert and oriented to person, place, and time.  Skin:  R lower thoracic spine - area of tick bite without erythema.  Psychiatric: He has a normal mood and affect.  Vitals reviewed.   Lab Results  Component Value Date   WBC 6.5 11/01/2015   HGB 14.7 11/01/2015   HCT 42.3 11/01/2015   PLT 214.0 11/01/2015   GLUCOSE 234 (H) 05/01/2016   CHOL 103 05/01/2016   TRIG 210.0 (H) 05/01/2016   HDL 34.10 (L) 05/01/2016   LDLDIRECT 49.0 05/01/2016   LDLCALC 58 11/01/2015   ALT 32 05/01/2016   AST 34 05/01/2016   NA 137 05/01/2016  K 3.7 05/01/2016   CL 103 05/01/2016   CREATININE 0.87 05/01/2016   BUN 19 05/01/2016   CO2 25 05/01/2016   TSH 0.76 12/19/2014   HGBA1C 7.3 (H) 05/01/2016    Assessment & Plan:   Problem List Items Addressed This Visit      Musculoskeletal and Integument   Tick bite of back    New problem. Unremarkable exam. No evidence of tick borne illness. Reassurance provided.        Other   RLQ abdominal pain    New problem. Abdominal exam reassuring. Patient is having ongoing diarrhea. Stopping metformin. Patient will monitor his sugars closely. He does not want additional pharmacotherapy at this time.      Mass of submandibular region    New problem. Clinically  does not feel like a lymph node although this is quite possible. Feels more like a cyst or lipoma. Advise watchful waiting.        Follow-up: July  [place]

## 2016-06-20 NOTE — Patient Instructions (Addendum)
Stop the Metformin. Monitor your sugars closely. Let me know if the rise.  Keep an eye on the area under your jaw.  Follow up in July (after the 18th) unless something changes.  Take care  Dr. Lacinda Axon

## 2016-06-22 ENCOUNTER — Other Ambulatory Visit: Payer: Self-pay | Admitting: Family Medicine

## 2016-06-24 NOTE — Telephone Encounter (Signed)
Clonazepam 0.5 mg  Last OV: 06/20/2016  Next OV 10/31/16 Last refilled 04/22/2016

## 2016-06-25 NOTE — Telephone Encounter (Signed)
Script faxed to [facility]

## 2016-07-01 DIAGNOSIS — R221 Localized swelling, mass and lump, neck: Secondary | ICD-10-CM | POA: Diagnosis not present

## 2016-07-22 DIAGNOSIS — E119 Type 2 diabetes mellitus without complications: Secondary | ICD-10-CM | POA: Diagnosis not present

## 2016-07-22 LAB — HM DIABETES EYE EXAM

## 2016-07-23 ENCOUNTER — Encounter: Payer: Self-pay | Admitting: Family Medicine

## 2016-07-31 DIAGNOSIS — R221 Localized swelling, mass and lump, neck: Secondary | ICD-10-CM | POA: Diagnosis not present

## 2016-08-21 ENCOUNTER — Encounter: Payer: Self-pay | Admitting: Family Medicine

## 2016-08-21 ENCOUNTER — Ambulatory Visit (INDEPENDENT_AMBULATORY_CARE_PROVIDER_SITE_OTHER): Payer: Medicare Other | Admitting: Family Medicine

## 2016-08-21 DIAGNOSIS — I2510 Atherosclerotic heart disease of native coronary artery without angina pectoris: Secondary | ICD-10-CM | POA: Diagnosis not present

## 2016-08-21 DIAGNOSIS — E1142 Type 2 diabetes mellitus with diabetic polyneuropathy: Secondary | ICD-10-CM | POA: Diagnosis not present

## 2016-08-21 DIAGNOSIS — H9201 Otalgia, right ear: Secondary | ICD-10-CM

## 2016-08-21 LAB — POCT GLYCOSYLATED HEMOGLOBIN (HGB A1C): Hemoglobin A1C: 6.7

## 2016-08-21 NOTE — Progress Notes (Signed)
Subjective:  Patient ID: [name] [name], male    DOB: 06-Nov-1940  Age: 76 y.o. MRN: [id number]  CC: Right ear pain, Follow up DM-87  HPI:  76 year old male with an extensive past medical history including type 2 diabetes, coronary disease, hypertension, hyperlipidemia presents with the above complaints.  Right ear pain  Started last week.  Was severe and sharp.  He took some over-the-counter Coricidin and had a dramatic improvement.  No drainage from the ear.  No associated fever or chills.  He is currently pain-free and states that it's resolved. He wants examined just to be on the safe side today.  DM 2  Patient previously stopped metformin due to GI side effects.  His sugars have been predominantly less than 150. He's had a few outliers.  He feels like he is doing well.  He is on no other form of therapy at this time.  Needs A1c today.  Social Hx   Social History   Social History  . Marital status: Married    Spouse name: N/A  . Number of children: N/A  . Years of education: N/A   Social History Main Topics  . Smoking status: Former Smoker    Types: Pipe  . Smokeless tobacco: Former Systems developer    Quit date: 10/17/2015  . Alcohol use 0.0 - 0.6 oz/week  . Drug use: No  . Sexual activity: Not Asked   Other Topics Concern  . None   Social History Narrative  . None    Review of Systems  Constitutional: Negative.   HENT: Positive for ear pain.    Objective:  BP 110/64 (BP Location: Left Arm, Patient Position: Sitting, Cuff Size: Normal)   Pulse 68   Temp 98.2 F (36.8 C) (Oral)   Resp 16   Wt 202 lb 4 oz (91.7 kg)   SpO2 98%   BMI 26.68 kg/m   BP/Weight 08/21/2016 02/23/9240 06/21/3417  Systolic BP 622 297 989  Diastolic BP 64 70 80  Wt. (Lbs) 202.25 207.13 209.5  BMI 26.68 27.33 27.64    Physical Exam  Constitutional: He is oriented to person, place, and time. He appears well-developed. No distress.  HENT:  Head: Normocephalic and atraumatic.    Mouth/Throat: Oropharynx is clear and moist.  Right TM normal. Left TM partially care by cerumen. Visible portion looks normal.  Eyes: Conjunctivae are normal. No scleral icterus.  Neck: Neck supple.  Pulmonary/Chest: Effort normal. No respiratory distress.  Neurological: He is alert and oriented to person, place, and time.  Psychiatric: He has a normal mood and affect.  Vitals reviewed.   Lab Results  Component Value Date   WBC 6.5 11/01/2015   HGB 14.7 11/01/2015   HCT 42.3 11/01/2015   PLT 214.0 11/01/2015   GLUCOSE 234 (H) 05/01/2016   CHOL 103 05/01/2016   TRIG 210.0 (H) 05/01/2016   HDL 34.10 (L) 05/01/2016   LDLDIRECT 49.0 05/01/2016   LDLCALC 58 11/01/2015   ALT 32 05/01/2016   AST 34 05/01/2016   NA 137 05/01/2016   K 3.7 05/01/2016   CL 103 05/01/2016   CREATININE 0.87 05/01/2016   BUN 19 05/01/2016   CO2 25 05/01/2016   TSH 0.76 12/19/2014   HGBA1C 6.7 08/21/2016    Assessment & Plan:   Problem List Items Addressed This Visit    DM type 2 (diabetes mellitus, type 2) (HCC)    A1c 6.7 today. Continue to monitor closely. He is doing well off medication at this  time.      Relevant Orders   POCT glycosylated hemoglobin (Hb A1C) (Completed)   Right ear pain    New problem. Has now resolved. Normal TM today.        Follow-up: 3-6 months  [place] DO [facility]

## 2016-08-21 NOTE — Assessment & Plan Note (Signed)
A1c 6.7 today. Continue to monitor closely. He is doing well off medication at this time.

## 2016-08-21 NOTE — Assessment & Plan Note (Addendum)
New problem. Has now resolved. Normal TM today.

## 2016-08-21 NOTE — Patient Instructions (Signed)
Continue to monitor your sugars daily.  Take care  Dr. Lacinda Axon

## 2016-09-05 ENCOUNTER — Other Ambulatory Visit: Payer: Self-pay | Admitting: Family Medicine

## 2016-09-05 NOTE — Telephone Encounter (Signed)
Last OV 08/21/2016 Next OV 09/09/2016 Last refill 06/04/2016

## 2016-09-09 ENCOUNTER — Encounter: Payer: Self-pay | Admitting: Family

## 2016-09-09 ENCOUNTER — Ambulatory Visit (INDEPENDENT_AMBULATORY_CARE_PROVIDER_SITE_OTHER): Payer: Medicare Other | Admitting: Family

## 2016-09-09 VITALS — BP 140/76 | HR 60 | Temp 97.7°F | Ht 73.0 in | Wt 203.0 lb

## 2016-09-09 DIAGNOSIS — R1030 Lower abdominal pain, unspecified: Secondary | ICD-10-CM

## 2016-09-09 DIAGNOSIS — I2510 Atherosclerotic heart disease of native coronary artery without angina pectoris: Secondary | ICD-10-CM | POA: Diagnosis not present

## 2016-09-09 LAB — URINALYSIS, ROUTINE W REFLEX MICROSCOPIC
Bilirubin Urine: NEGATIVE
Ketones, ur: NEGATIVE
Leukocytes, UA: NEGATIVE
Nitrite: NEGATIVE
Specific Gravity, Urine: 1.015 (ref 1.000–1.030)
Total Protein, Urine: NEGATIVE
Urine Glucose: NEGATIVE
Urobilinogen, UA: 0.2 (ref 0.0–1.0)
pH: 6.5 (ref 5.0–8.0)

## 2016-09-09 LAB — COMPREHENSIVE METABOLIC PANEL
ALT: 25 U/L (ref 0–53)
AST: 27 U/L (ref 0–37)
Albumin: 4.2 g/dL (ref 3.5–5.2)
Alkaline Phosphatase: 35 U/L — ABNORMAL LOW (ref 39–117)
BUN: 20 mg/dL (ref 6–23)
CO2: 31 mEq/L (ref 19–32)
Calcium: 9.8 mg/dL (ref 8.4–10.5)
Chloride: 104 mEq/L (ref 96–112)
Creatinine, Ser: 0.84 mg/dL (ref 0.40–1.50)
GFR: 94.26 mL/min (ref >60.00)
Glucose, Bld: 151 mg/dL — ABNORMAL HIGH (ref 70–99)
Potassium: 4.2 mEq/L (ref 3.5–5.1)
Sodium: 140 mEq/L (ref 135–145)
Total Bilirubin: 0.8 mg/dL (ref 0.2–1.2)
Total Protein: 7.1 g/dL (ref 6.0–8.3)

## 2016-09-09 LAB — LIPASE: Lipase: 55 U/L (ref 11.0–59.0)

## 2016-09-09 LAB — H. PYLORI ANTIBODY, IGG: H Pylori IgG: NEGATIVE

## 2016-09-09 NOTE — Progress Notes (Signed)
Pre visit review using our clinic review tool, if applicable. No additional management support is needed unless otherwise documented below in the visit note. 

## 2016-09-09 NOTE — Progress Notes (Signed)
Subjective:    Patient ID: [name] [name], male    DOB: 27-Sep-1940, 76 y.o.   MRN: [id number]  CC: [name] is a 76 y.o. male who presents today for an acute visit.    HPI: CC: midline low abdominal pain, intermittent, one year.   Tends to have no pain in the  Morning; progresses after lunch, it starts. Will resolve in afternoon with propping feet up, nap.   Occasionally on left side, more often thinks on right side. Thought pain would improve off of metformin, which it did at first however.   Improves with eating.  No diarrhea, fever, chills, dysuria, early satiation, dysuria, belching, unintentional weight loss, black tarry stools0  Reports colonoscopy 2017, polyps. Done in [place], [place]. Told to return in 5 years.  Never had EGD. No h/o GERD.   DM- diet controlled. Averages 130 blood sugar.  no metformin at this time, last a1c 6.7.   H/o kidney stones, diverticulitis  NO NSAID use. On 81 mg ASA  Drinks coffee- 4-5 times per day. No alcohol.      h/o cholecystectomy  H/o abdominal surgery 1972, after removal of polyp through abdominal wall.   HISTORY:  Past Medical History:  Diagnosis Date  . Arthritis   . CAD (coronary artery disease)   . Chicken pox   . Colon polyps    4 pre-cancerous   . Diverticulitis   . DM type 2 (diabetes mellitus, type 2) ([place])   . Hyperlipidemia   . Hypertension   . Kidney stones   . Melanoma [facility])    Past Surgical History:  Procedure Laterality Date  . CHOLECYSTECTOMY  2012  . CORONARY ANGIOPLASTY WITH STENT PLACEMENT  1991 & 2005  . LITHOTRIPSY  2015  . MELANOMA EXCISION  1980   malignant  . NERVE SURGERY  2015   ulna nerve  . TONSILLECTOMY  1945  . WISDOM TOOTH EXTRACTION     Family History  Problem Relation Age of Onset  . Hyperlipidemia Mother   . Hypertension Mother   . Heart disease Mother   . Diabetes Mother   . Colon cancer Father   . Lung cancer Father   . Kidney cancer Father        malignant  capsulated kidney tumor  . Diabetes Father     Allergies: Azithromycin Current Outpatient Prescriptions on File Prior to Visit  Medication Sig Dispense Refill  . aspirin EC 81 MG tablet Take 81 mg by mouth daily.    [name] atorvastatin (LIPITOR) 80 MG tablet Take 1 tablet (80 mg total) by mouth daily. 90 tablet 3  . Calcium Polycarbophil (FIBER-CAPS PO) Take by mouth daily.    . clonazePAM (KLONOPIN) 0.5 MG tablet TAKE 1 TABLET BY MOUTH TWICE DAILY AS NEEDED FOR ANXIETY 60 tablet 3  . fenofibrate 160 MG tablet TAKE 1 TABLET BY MOUTH EVERY DAY 90 tablet 3  . fluticasone (FLONASE) 50 MCG/ACT nasal spray Place 2 sprays into both nostrils daily. 16 g 6  . glucose blood test strip Use as instructed to check blood sugar up to three times daily. E11.42 100 each 12  . hydrochlorothiazide (HYDRODIURIL) 25 MG tablet TAKE 1 TABLET BY MOUTH EVERY DAY 90 tablet 3  . lisinopril (PRINIVIL,ZESTRIL) 10 MG tablet TAKE 1 TABLET BY MOUTH DAILY 90 tablet 3  . metoprolol succinate (TOPROL-XL) 25 MG 24 hr tablet TAKE 1/2 TABLET BY MOUTH TWICE DAILY 90 tablet 3  . Multiple Vitamin (MULTIVITAMIN) capsule Take 1  capsule by mouth daily.    . Omega-3 Fatty Acids (FISH OIL PO) Take by mouth daily.    . tamsulosin (FLOMAX) 0.4 MG CAPS capsule Take 1 capsule (0.4 mg total) by mouth daily. 90 capsule 3  . traMADol (ULTRAM) 50 MG tablet TAKE 1 TABLET BY MOUTH EVERY 8 HOURS AS NEEDED 90 tablet 0   No current facility-administered medications on file prior to visit.     Social History  Substance Use Topics  . Smoking status: Former Smoker    Types: Pipe  . Smokeless tobacco: Former Systems developer    Quit date: 10/17/2015  . Alcohol use 0.0 - 0.6 oz/week    Review of Systems  Constitutional: Negative for chills and fever.  Respiratory: Negative for cough.   Cardiovascular: Negative for chest pain and palpitations.  Gastrointestinal: Positive for abdominal pain. Negative for abdominal distention, blood in stool, constipation,  diarrhea, nausea and vomiting.      Objective:    BP 140/76   Pulse 60   Temp 97.7 F (36.5 C) (Oral)   Ht 6\' 1"  (1.854 m)   Wt 203 lb (92.1 kg)   SpO2 98%   BMI 26.78 kg/m    Physical Exam  Constitutional: He appears well-developed and well-nourished.  Cardiovascular: Regular rhythm and normal heart sounds.   Pulmonary/Chest: Effort normal and breath sounds normal. No respiratory distress. He has no wheezes. He has no rales.  Abdominal: Soft. Normal appearance and bowel sounds are normal. He exhibits no distension, no fluid wave, no ascites and no mass. There is no tenderness. There is no rigidity, no rebound, no guarding, no CVA tenderness, no tenderness at McBurney's point and negative [name]'s sign.    Scar noted. Mild suprapubic tenderness.   Neurological: He is alert.  Skin: Skin is warm and dry.  Psychiatric: He has a normal mood and affect. His speech is normal and behavior is normal.  Vitals reviewed.      Assessment & Plan:   1. Lower abdominal pain Afebrile. Working diagnosis of complaint is quite vague at this time. Differentials include PUD, GERD. Less likely think acute such as diverticulitis, UTI  based on duration of symptoms.   Based on duration symptoms, patient I jointly agreed today to pursue a more thorough approach including CT scan of abdomen and pelvis, H. pylori screen, blood work, urinalysis.  Trial of Zantac for PUD. Advised patient if we start to lean more towards this differential, would advise GI consult with EGD.  Close follow-up  - H. pylori antibody, IgG; Future - Comprehensive metabolic panel - Lipase - Urinalysis, Routine w reflex microscopic - CT ABDOMEN PELVIS W CONTRAST - H. pylori antibody, IgG    I am having Mr. Bamber maintain his aspirin EC, fluticasone, fenofibrate, atorvastatin, tamsulosin, hydrochlorothiazide, metoprolol succinate, Omega-3 Fatty Acids (FISH OIL PO), Calcium Polycarbophil (FIBER-CAPS PO), multivitamin,  lisinopril, glucose blood, clonazePAM, and traMADol.   No orders of the defined types were placed in this encounter.   Return precautions given.   Risks, benefits, and alternatives of the medications and treatment plan prescribed today were discussed, and patient expressed understanding.   Education regarding symptom management and diagnosis given to patient on AVS.  Continue to follow with Coral Spikes, DO for routine health maintenance.   [name] and I agreed with plan.   [name], [name]

## 2016-09-09 NOTE — Patient Instructions (Signed)
Labs  Urine CT abdomen- you will need to be nothing by mouth ( NPO) for this  Trial zantac OTC 150mg  twice per day, one hour before meals.   Please stay very vigilant as discussed since we are unsure of your etiology of pain.   If there is no improvement in your symptoms, or if there is any worsening of symptoms, or if you have any additional concerns, please return for re-evaluation; or, if we are closed, consider going to the Emergency Room for evaluation if symptoms urgent.

## 2016-09-10 ENCOUNTER — Other Ambulatory Visit: Payer: Self-pay | Admitting: Family

## 2016-09-10 DIAGNOSIS — R319 Hematuria, unspecified: Secondary | ICD-10-CM

## 2016-09-12 ENCOUNTER — Telehealth: Payer: Self-pay | Admitting: Family Medicine

## 2016-09-12 ENCOUNTER — Other Ambulatory Visit: Payer: Medicare Other

## 2016-09-12 NOTE — Telephone Encounter (Signed)
Pt would like to have his CT done at [facility] if possible.

## 2016-09-12 NOTE — Telephone Encounter (Signed)
Pt's ct is scheduled for 9/11-he is aware. He wants [name] to know that since he started the zantac he has not had any abdominal pain. He also forgot to mention to you that he also has cyst in both kidneys that was found 3-4 yrs ago by CT but was told that they did not have blood flow to them.

## 2016-09-12 NOTE — Telephone Encounter (Signed)
Noted! Thank you

## 2016-09-13 ENCOUNTER — Other Ambulatory Visit (INDEPENDENT_AMBULATORY_CARE_PROVIDER_SITE_OTHER): Payer: Medicare Other

## 2016-09-13 DIAGNOSIS — R319 Hematuria, unspecified: Secondary | ICD-10-CM

## 2016-09-13 LAB — URINALYSIS, ROUTINE W REFLEX MICROSCOPIC
Bacteria, UA: NONE SEEN
Bilirubin Urine: NEGATIVE
Ketones, ur: NEGATIVE
Leukocytes, UA: NEGATIVE
Nitrite: NEGATIVE
Specific Gravity, Urine: 1.015 (ref 1.000–1.030)
Total Protein, Urine: NEGATIVE
Urine Glucose: NEGATIVE
Urobilinogen, UA: 0.2 (ref 0.0–1.0)
pH: 6 (ref 5.0–8.0)

## 2016-09-15 LAB — CULTURE, URINE COMPREHENSIVE: Organism ID, Bacteria: NO GROWTH

## 2016-09-17 ENCOUNTER — Other Ambulatory Visit: Payer: Self-pay | Admitting: Family

## 2016-09-17 DIAGNOSIS — R319 Hematuria, unspecified: Secondary | ICD-10-CM

## 2016-09-24 ENCOUNTER — Ambulatory Visit
Admission: RE | Admit: 2016-09-24 | Discharge: 2016-09-24 | Disposition: A | Discharge status: Home / Self Care | Payer: Medicare Other | Source: Ambulatory Visit | Attending: Family | Admitting: Family

## 2016-09-24 ENCOUNTER — Telehealth: Payer: Self-pay | Admitting: Family

## 2016-09-24 DIAGNOSIS — K5730 Diverticulosis of large intestine without perforation or abscess without bleeding: Secondary | ICD-10-CM | POA: Insufficient documentation

## 2016-09-24 DIAGNOSIS — N281 Cyst of kidney, acquired: Secondary | ICD-10-CM | POA: Insufficient documentation

## 2016-09-24 DIAGNOSIS — N2889 Other specified disorders of kidney and ureter: Secondary | ICD-10-CM | POA: Diagnosis not present

## 2016-09-24 DIAGNOSIS — K760 Fatty (change of) liver, not elsewhere classified: Secondary | ICD-10-CM | POA: Insufficient documentation

## 2016-09-24 DIAGNOSIS — E278 Other specified disorders of adrenal gland: Secondary | ICD-10-CM | POA: Insufficient documentation

## 2016-09-24 DIAGNOSIS — I313 Pericardial effusion (noninflammatory): Secondary | ICD-10-CM | POA: Diagnosis not present

## 2016-09-24 DIAGNOSIS — N200 Calculus of kidney: Secondary | ICD-10-CM | POA: Diagnosis not present

## 2016-09-24 DIAGNOSIS — I700 Atherosclerosis of aorta: Secondary | ICD-10-CM | POA: Insufficient documentation

## 2016-09-24 DIAGNOSIS — R1030 Lower abdominal pain, unspecified: Secondary | ICD-10-CM | POA: Diagnosis not present

## 2016-09-24 MED ORDER — IOPAMIDOL (ISOVUE-300) INJECTION 61%
100.0000 mL | Freq: Once | INTRAVENOUS | Status: AC | PRN
  Administered 2016-09-24: 100 mL via INTRAVENOUS

## 2016-09-24 NOTE — Telephone Encounter (Signed)
Please call pt and  Have him make an appt with me to review CT scan next week.  Nothing urgent however I would like to review findings in person.

## 2016-09-24 NOTE — Telephone Encounter (Signed)
Scheduled appoiontment for [id number] to discuss results.

## 2016-09-26 ENCOUNTER — Telehealth: Payer: Self-pay | Admitting: *Deleted

## 2016-09-26 ENCOUNTER — Encounter: Payer: Self-pay | Admitting: Urology

## 2016-09-26 ENCOUNTER — Telehealth: Payer: Self-pay | Admitting: Family

## 2016-09-26 ENCOUNTER — Ambulatory Visit (INDEPENDENT_AMBULATORY_CARE_PROVIDER_SITE_OTHER): Payer: Medicare Other | Admitting: Urology

## 2016-09-26 VITALS — BP 107/65 | HR 79 | Ht 73.0 in | Wt 205.5 lb

## 2016-09-26 DIAGNOSIS — I2510 Atherosclerotic heart disease of native coronary artery without angina pectoris: Secondary | ICD-10-CM

## 2016-09-26 DIAGNOSIS — N200 Calculus of kidney: Secondary | ICD-10-CM | POA: Diagnosis not present

## 2016-09-26 DIAGNOSIS — N2889 Other specified disorders of kidney and ureter: Secondary | ICD-10-CM

## 2016-09-26 DIAGNOSIS — R310 Gross hematuria: Secondary | ICD-10-CM

## 2016-09-26 DIAGNOSIS — K760 Fatty (change of) liver, not elsewhere classified: Secondary | ICD-10-CM

## 2016-09-26 LAB — URINALYSIS, COMPLETE
Bilirubin, UA: NEGATIVE
Glucose, UA: NEGATIVE
Ketones, UA: NEGATIVE
Nitrite, UA: NEGATIVE
Protein, UA: NEGATIVE
RBC, UA: NEGATIVE
Specific Gravity, UA: 1.01 (ref 1.005–1.030)
Urobilinogen, Ur: 0.2 mg/dL (ref 0.2–1.0)
pH, UA: 7 (ref 5.0–7.5)

## 2016-09-26 LAB — MICROSCOPIC EXAMINATION
Bacteria, UA: NONE SEEN
Epithelial Cells (non renal): NONE SEEN /hpf (ref 0–10)

## 2016-09-26 NOTE — Telephone Encounter (Signed)
Pt called back returning your call.  Call pt @ [phone]. Thank you!

## 2016-09-26 NOTE — Telephone Encounter (Signed)
Patient stated that he is very claustrophobic. Please advise.

## 2016-09-26 NOTE — Progress Notes (Signed)
09/26/2016 9:03 AM   [name] [birth year] [id number]  Referring provider: [name], [name] [address] [place], [place] [address]  Chief Complaint  Patient presents with  . Hematuria    HPI: The patient is a 76 year old gentleman with a past medical history of nephrolithiasis requiring lithotripsy presents today for evaluation of microscopic hematuria. He did have a CT abdomen and pelvis with contrast only recently. There are multiple urological findings. He had multiple large calculi bilaterally. The largest on the right is in the lower pole and is approximately 1.4 cm. On the left the largest is approximately 1.8 cm. There are also multiple 5-8 mm stones throughout each kidney bilaterally. Hounsfield units were up to 1500. There is also 1.2 cm right lower pole lesion that is not completely characterized. He also has bilateral up to 7 cm benign simple renal cysts.    He has a significant history of nephrolithiasis. His stones have been calcium oxalate in the past. He has passed multiple stones in the past. He is required surgery 3 times. His lithotripsy once ureteroscopy twice. He has not passed a stone in over a year. He does have a CT report from 2008 that notes his current larger stones were probably half the current size at that time.  He also has microscopic hematuria. He has not seen recent gross hematuria. He has had gross hematuria and passing stones before. He has had none recently.  His main complaint today is lower abdominal pain. Since it's related to his previous colon resection along his Pfannenstiel scar. His CT scan showed no obvious source for this.  PMH: Past Medical History:  Diagnosis Date  . Arthritis   . CAD (coronary artery disease)   . Chicken pox   . Colon polyps    4 pre-cancerous   . Diverticulitis   . DM type 2 (diabetes mellitus, type 2) ([place])   . Hyperlipidemia   . Hypertension   . Kidney stones   . Melanoma ([place]) 1980   Resected from his back    Surgical History: Past Surgical History:  Procedure Laterality Date  . CHOLECYSTECTOMY  2012  . CORONARY ANGIOPLASTY WITH STENT PLACEMENT  1991 & 2005  . LITHOTRIPSY  2015  . MELANOMA EXCISION  1980   malignant  . NERVE SURGERY  2015   ulna nerve  . TONSILLECTOMY  1945  . WISDOM TOOTH EXTRACTION      Home Medications:  Allergies as of 09/26/2016      Reactions   Azithromycin       Medication List       Accurate as of 09/26/16  9:03 AM. Always use your most recent med list.          aspirin EC 81 MG tablet Take 81 mg by mouth daily.   atorvastatin 80 MG tablet Commonly known as:  LIPITOR Take 1 tablet (80 mg total) by mouth daily.   clonazePAM 0.5 MG tablet Commonly known as:  KLONOPIN TAKE 1 TABLET BY MOUTH TWICE DAILY AS NEEDED FOR ANXIETY   fenofibrate 160 MG tablet TAKE 1 TABLET BY MOUTH EVERY DAY   FIBER-CAPS PO Take by mouth daily.   FISH OIL PO Take by mouth daily.   fluticasone 50 MCG/ACT nasal spray Commonly known as:  FLONASE Place 2 sprays into both nostrils daily.   glucose blood test strip Use as instructed to check blood sugar up to three times daily. E11.42   hydrochlorothiazide 25 MG tablet Commonly  known as:  HYDRODIURIL TAKE 1 TABLET BY MOUTH EVERY DAY   lisinopril 10 MG tablet Commonly known as:  PRINIVIL,ZESTRIL TAKE 1 TABLET BY MOUTH DAILY   metoprolol succinate 25 MG 24 hr tablet Commonly known as:  TOPROL-XL TAKE 1/2 TABLET BY MOUTH TWICE DAILY   multivitamin capsule Take 1 capsule by mouth daily.   tamsulosin 0.4 MG Caps capsule Commonly known as:  FLOMAX Take 1 capsule (0.4 mg total) by mouth daily.   traMADol 50 MG tablet Commonly known as:  ULTRAM TAKE 1 TABLET BY MOUTH EVERY 8 HOURS AS NEEDED            Discharge Care Instructions        Start     Ordered   09/26/16 0000  Urinalysis, Complete     09/26/16 0823   09/26/16 0000  MR Abdomen W Wo Contrast    Question Answer  Comment  If indicated for the ordered procedure, I authorize the administration of contrast media per Radiology protocol Yes   What is the patient's sedation requirement? No Sedation   Does the patient have a pacemaker or implanted devices? No   Radiology Contrast Protocol - do NOT remove file path \\[email or web address]   Preferred imaging location? [facility] (table limit-300lbs)      09/26/16 0858      Allergies:  Allergies  Allergen Reactions  . Azithromycin     Family History: Family History  Problem Relation Age of Onset  . Hyperlipidemia Mother   . Hypertension Mother   . Heart disease Mother   . Diabetes Mother   . Colon cancer Father   . Lung cancer Father   . Kidney cancer Father        malignant capsulated kidney tumor  . Diabetes Father   . Bladder Cancer Neg Hx   . Prostate cancer Neg Hx     Social History:  reports that he has quit smoking. His smoking use included Pipe. He quit smokeless tobacco use about a year ago. He reports that he drinks alcohol. He reports that he does not use drugs.  ROS: UROLOGY Frequent Urination?: Yes Hard to postpone urination?: No Burning/pain with urination?: No Get up at night to urinate?: Yes Leakage of urine?: No Urine stream starts and stops?: Yes Trouble starting stream?: No Do you have to strain to urinate?: No Blood in urine?: Yes Urinary tract infection?: No Sexually transmitted disease?: No Injury to kidneys or bladder?: No Painful intercourse?: No Weak stream?: No Erection problems?: Yes Penile pain?: No  Gastrointestinal Nausea?: No Vomiting?: No Indigestion/heartburn?: No Diarrhea?: Yes Constipation?: No  Constitutional Fever: No Night sweats?: No Weight loss?: No Fatigue?: Yes  Skin Skin rash/lesions?: No Itching?: No  Eyes Blurred vision?: Yes Double vision?: No  Ears/Nose/Throat Sore throat?: No Sinus problems?: Yes  Hematologic/Lymphatic Swollen  glands?: No Easy bruising?: No  Cardiovascular Leg swelling?: No Chest pain?: No  Respiratory Cough?: No Shortness of breath?: No  Endocrine Excessive thirst?: No  Musculoskeletal Back pain?: Yes Joint pain?: No  Neurological Headaches?: No Dizziness?: No  Psychologic Depression?: No Anxiety?: No  Physical Exam: BP 107/65 (BP Location: Left Arm, Patient Position: Sitting, Cuff Size: Normal)   Pulse 79   Ht 6\' 1"  (1.854 m)   Wt 205 lb 8 oz (93.2 kg)   BMI 27.11 kg/m   Constitutional:  Alert and oriented, No acute distress. HEENT: [place] AT, moist mucus membranes.  Trachea midline, no masses. Cardiovascular: No clubbing, cyanosis, or edema.  Respiratory: Normal respiratory effort, no increased work of breathing. GI: Abdomen is soft, nontender, nondistended, no abdominal masses GU: No CVA tenderness.  Skin: No rashes, bruises or suspicious lesions. Lymph: No cervical or inguinal adenopathy. Neurologic: Grossly intact, no focal deficits, moving all 4 extremities. Psychiatric: Normal mood and affect.  Laboratory Data: Lab Results  Component Value Date   WBC 6.5 11/01/2015   HGB 14.7 11/01/2015   HCT 42.3 11/01/2015   MCV 87.2 11/01/2015   PLT 214.0 11/01/2015    Lab Results  Component Value Date   CREATININE 0.84 09/09/2016    No results found for: PSA  No results found for: TESTOSTERONE  Lab Results  Component Value Date   HGBA1C 6.7 08/21/2016    Urinalysis    Component Value Date/Time   COLORURINE YELLOW 09/13/2016 1439   APPEARANCEUR Clear 09/13/2016 1439   LABSPEC 1.015 09/13/2016 1439   PHURINE 6.0 09/13/2016 1439   GLUCOSEU NEGATIVE 09/13/2016 1439   HGBUR MODERATE (A) 09/13/2016 1439   BILIRUBINUR NEGATIVE 09/13/2016 1439   KETONESUR NEGATIVE 09/13/2016 1439   UROBILINOGEN 0.2 09/13/2016 1439   NITRITE NEGATIVE 09/13/2016 1439   LEUKOCYTESUR NEGATIVE 09/13/2016 1439    Pertinent Imaging: CT reviewed as above.  Assessment & Plan:    1. Indeterminate right renal mass We'll order MRI renal mass protocol for further evaluation.  2. Microscopic hematuria We'll plan for cystoscopy after MRI above  3. Bilateral nephrolithiasis I discussed the patient and she would likely need staged PCNL bilaterally at the very least to render him stone free. He is not currently interested in surgical management of this. He would like to continue to manage it conservatively as long as she remains asymptomatic. We'll monitor his stone size with KUB annually.   Return for after MRI for cysto.  Nickie Retort, MD  [facility] [address] [place], [place] [address] [phone]

## 2016-09-26 NOTE — Telephone Encounter (Signed)
Call pt  Pleased to see note from urology, dr Pilar Jarvis and that patient will have MRI abdomen. I would have suggested the same.   Since pt doesn't come in until Oct, I went ahead and placed a referral to GI for the fatty liver seen on CT as well as thickening of stomach for further evaluation. Let him know we will call with an appt.  We can discuss CT and findings in more detail during OV.

## 2016-09-26 NOTE — Telephone Encounter (Signed)
Patient was informed of results.  Patient understood and no questions, comments, or concerns at this time.  

## 2016-09-26 NOTE — Telephone Encounter (Signed)
Pt has requested to have a medication to help him relax while having his MRI on 09/21 Pt contact [place]

## 2016-09-26 NOTE — Telephone Encounter (Signed)
Left note for patient to return call back.

## 2016-09-30 NOTE — Telephone Encounter (Signed)
Please advise may take a klonopin an hour prior to MRI appt. ( he is prescribed klonopin)

## 2016-09-30 NOTE — Telephone Encounter (Signed)
Patient has been informed.

## 2016-10-01 ENCOUNTER — Ambulatory Visit: Payer: Medicare Other | Admitting: Family

## 2016-10-02 ENCOUNTER — Encounter: Payer: Self-pay | Admitting: Gastroenterology

## 2016-10-03 ENCOUNTER — Ambulatory Visit: Payer: Medicare Other | Admitting: Family

## 2016-10-04 ENCOUNTER — Ambulatory Visit
Admission: RE | Admit: 2016-10-04 | Discharge: 2016-10-04 | Disposition: A | Discharge status: Home / Self Care | Payer: Medicare Other | Source: Ambulatory Visit | Attending: Urology | Admitting: Urology

## 2016-10-04 DIAGNOSIS — N2889 Other specified disorders of kidney and ureter: Secondary | ICD-10-CM

## 2016-10-04 DIAGNOSIS — R310 Gross hematuria: Secondary | ICD-10-CM | POA: Diagnosis not present

## 2016-10-04 DIAGNOSIS — I700 Atherosclerosis of aorta: Secondary | ICD-10-CM | POA: Diagnosis not present

## 2016-10-04 DIAGNOSIS — N281 Cyst of kidney, acquired: Secondary | ICD-10-CM | POA: Insufficient documentation

## 2016-10-04 MED ORDER — GADOBENATE DIMEGLUMINE 529 MG/ML IV SOLN
20.0000 mL | Freq: Once | INTRAVENOUS | Status: AC | PRN
  Administered 2016-10-04: 17 mL via INTRAVENOUS

## 2016-10-06 NOTE — Progress Notes (Signed)
Cardiology Office Note  Date:  10/08/2016   ID:  [name] [name], DOB June 25, 1940, MRN [id number]  PCP:  [name], [name]   Chief Complaint  Patient presents with  . Other    6 month follow up. Patient c/0 lower abdominal pain.  Meds reviewed verbally with patient.     HPI:   [name] [name] is a 76 y.o. male  history of  CAD  status post MI DM II HTN Hyperlipidemia Melanoma Abdominal aorta atherosclerosis stenting was 1991, and  2005 , symptoms  nausea and jaw pain.   In follow-up he reports that he is doing relatively well Denies any chest pain or shortness of breath concerning for angina No regular exercise program, thinking of joining the gym  He is having some chronic Lower ABD pain Had MRI, spot in kidney Scheduled to have cystoscopyper the patient  Lab work reviewed extensively with him LDL 49 HBA1C 6.7  EKG personally reviewed by myself on todays visit hows normal sinus rhythm rate 67 bpm left axis deviation, T-wave abnormality V4 through V6  Other past medical history reviewed with him on today's visit Lexiscan stress test 03/20/2010: Old inferior lateral MI. Slight hypokinesia effort and inferolateral wall but EF is 60%. No ongoing ischemia.  Review of outside records: Pharmacologic nuclear stress test 03/26/2013,Lexiscan Old inferior MI. No ischemia. Well-preserved LVEF 50%. Slight hypokinesia of the inferior wall.  Echo 03/26/2013: LV borderline dilated. EF 50-55%. Mild global hypokinesis of LV. LA is moderately dilated. RV mildly dilated. Mild to moderate aortic sclerosis. Mild AI. Mild to moderate mitral valve thickening. Mild to moderate MR. RVSP 31 mmHg. Mild TR. Aortic root is normal in size. No pericardial effusion.   PMH:   has a past medical history of Arthritis; CAD (coronary artery disease); Chicken pox; Colon polyps; Diverticulitis; DM type 2 (diabetes mellitus, type 2) ([place]); Hyperlipidemia; Hypertension; Kidney stones; and Melanoma  ([place]) (1980).  PSH:    Past Surgical History:  Procedure Laterality Date  . CHOLECYSTECTOMY  2012  . CORONARY ANGIOPLASTY WITH STENT PLACEMENT  1991 & 2005  . LITHOTRIPSY  2015  . MELANOMA EXCISION  1980   malignant  . NERVE SURGERY  2015   ulna nerve  . TONSILLECTOMY  1945  . WISDOM TOOTH EXTRACTION      Current Outpatient Prescriptions  Medication Sig Dispense Refill  . aspirin EC 81 MG tablet Take 81 mg by mouth daily.    [name] atorvastatin (LIPITOR) 80 MG tablet Take 1 tablet (80 mg total) by mouth daily. 90 tablet 3  . Calcium Polycarbophil (FIBER-CAPS PO) Take by mouth daily.    . clonazePAM (KLONOPIN) 0.5 MG tablet TAKE 1 TABLET BY MOUTH TWICE DAILY AS NEEDED FOR ANXIETY 60 tablet 3  . fenofibrate 160 MG tablet TAKE 1 TABLET BY MOUTH EVERY DAY 90 tablet 3  . fluticasone (FLONASE) 50 MCG/ACT nasal spray Place 2 sprays into both nostrils daily. 16 g 6  . glucose blood test strip Use as instructed to check blood sugar up to three times daily. E11.42 100 each 12  . hydrochlorothiazide (HYDRODIURIL) 25 MG tablet TAKE 1 TABLET BY MOUTH EVERY DAY 90 tablet 3  . lisinopril (PRINIVIL,ZESTRIL) 10 MG tablet TAKE 1 TABLET BY MOUTH DAILY 90 tablet 3  . metoprolol succinate (TOPROL-XL) 25 MG 24 hr tablet TAKE 1/2 TABLET BY MOUTH TWICE DAILY 90 tablet 3  . Multiple Vitamin (MULTIVITAMIN) capsule Take 1 capsule by mouth daily.    . Omega-3 Fatty Acids (FISH  OIL PO) Take by mouth daily.    . tamsulosin (FLOMAX) 0.4 MG CAPS capsule Take 1 capsule (0.4 mg total) by mouth daily. 90 capsule 3  . traMADol (ULTRAM) 50 MG tablet TAKE 1 TABLET BY MOUTH EVERY 8 HOURS AS NEEDED 90 tablet 0   No current facility-administered medications for this visit.      Allergies:   Azithromycin   Social History:  The patient  reports that he has quit smoking. His smoking use included Pipe. He quit smokeless tobacco use about a year ago. He reports that he drinks alcohol. He reports that he does not use drugs.    Family History:   family history includes Colon cancer in his father; Diabetes in his father and mother; Heart disease in his mother; Hyperlipidemia in his mother; Hypertension in his mother; Kidney cancer in his father; Lung cancer in his father.    Review of Systems: Review of Systems  Constitutional: Negative.   Respiratory: Negative.   Cardiovascular: Negative.   Gastrointestinal: Positive for abdominal pain.  Musculoskeletal: Negative.   Neurological: Negative.   Psychiatric/Behavioral: Negative.   All other systems reviewed and are negative.    PHYSICAL EXAM: VS:  BP 106/64 (BP Location: Left Arm, Patient Position: Sitting, Cuff Size: Normal)   Pulse 67   Ht 6\' 1"  (1.854 m)   Wt 206 lb (93.4 kg)   BMI 27.18 kg/m  , BMI Body mass index is 27.18 kg/m. GEN: Well nourished, well developed, in no acute distress  HEENT: normal  Neck: no JVD, carotid bruits, or masses Cardiac: RRR; no murmurs, rubs, or gallops,no edema  Respiratory:  clear to auscultation bilaterally, normal work of breathing GI: soft, nontender, nondistended, + BS MS: no deformity or atrophy  Skin: warm and dry, no rash Neuro:  Strength and sensation are intact Psych: euthymic mood, full affect    Recent Labs: 11/01/2015: Hemoglobin 14.7; Platelets 214.0 09/09/2016: ALT 25; BUN 20; Creatinine, Ser 0.84; Potassium 4.2; Sodium 140    Lipid Panel Lab Results  Component Value Date   CHOL 103 05/01/2016   HDL 34.10 (L) 05/01/2016   LDLCALC 49 04/2016   TRIG 210.0 (H) 05/01/2016      Wt Readings from Last 3 Encounters:  10/08/16 206 lb (93.4 kg)  09/26/16 205 lb 8 oz (93.2 kg)  09/09/16 203 lb (92.1 kg)       ASSESSMENT AND PLAN:  Coronary artery disease of native artery of native heart with stable angina pectoris ([place]) - Plan: EKG 12-Lead Currently with no symptoms of angina. No further workup at this time. Continue current medication regimen.  Essential hypertension Blood pressure is  well controlled on today's visit. No changes made to the medications.  Mixed hyperlipidemia Cholesterol is at goal on the current lipid regimen. No changes to the medications were made.  Atherosclerosis of abdominal aorta (HCC) Seen on CT scan and MRI Nonsmoker, sugars at goal, good cholesterol  Type 2 diabetes mellitus with other circulatory complication, without long-term current use of insulin (HCC) Hemoglobin A1c down to 6.7 on no medication Previously had issues on metformin  Carotid artery disease, unspecified laterality ([place]) Nonobstructive disease Reviewed with him  Disposition:   F/U  12 months   Orders Placed This Encounter  Procedures  . EKG 12-Lead     Signed, Esmond Plants, M.D., Ph.D. 10/08/2016  [place], [place]

## 2016-10-07 ENCOUNTER — Telehealth: Payer: Self-pay | Admitting: Urology

## 2016-10-07 NOTE — Telephone Encounter (Signed)
Pt called to get MRI results.  He would also like a printed copy.  Please give pt a call.

## 2016-10-08 ENCOUNTER — Encounter: Payer: Self-pay | Admitting: Cardiovascular Disease

## 2016-10-08 ENCOUNTER — Ambulatory Visit (INDEPENDENT_AMBULATORY_CARE_PROVIDER_SITE_OTHER): Payer: Medicare Other | Admitting: Cardiovascular Disease

## 2016-10-08 VITALS — BP 106/64 | HR 67 | Ht 73.0 in | Wt 206.0 lb

## 2016-10-08 DIAGNOSIS — Z23 Encounter for immunization: Secondary | ICD-10-CM

## 2016-10-08 DIAGNOSIS — I700 Atherosclerosis of aorta: Secondary | ICD-10-CM | POA: Diagnosis not present

## 2016-10-08 DIAGNOSIS — I10 Essential (primary) hypertension: Secondary | ICD-10-CM | POA: Diagnosis not present

## 2016-10-08 DIAGNOSIS — E1159 Type 2 diabetes mellitus with other circulatory complications: Secondary | ICD-10-CM

## 2016-10-08 DIAGNOSIS — I2510 Atherosclerotic heart disease of native coronary artery without angina pectoris: Secondary | ICD-10-CM | POA: Diagnosis not present

## 2016-10-08 DIAGNOSIS — E782 Mixed hyperlipidemia: Secondary | ICD-10-CM | POA: Diagnosis not present

## 2016-10-08 DIAGNOSIS — I739 Peripheral vascular disease, unspecified: Secondary | ICD-10-CM

## 2016-10-08 DIAGNOSIS — I25118 Atherosclerotic heart disease of native coronary artery with other forms of angina pectoris: Secondary | ICD-10-CM | POA: Diagnosis not present

## 2016-10-08 DIAGNOSIS — I209 Angina pectoris, unspecified: Secondary | ICD-10-CM | POA: Diagnosis not present

## 2016-10-08 DIAGNOSIS — I779 Disorder of arteries and arterioles, unspecified: Secondary | ICD-10-CM

## 2016-10-08 NOTE — Telephone Encounter (Signed)
Patient notified that MRI results will be discussed at [facility]yst[facility] apt, patient verbalized understanding

## 2016-10-08 NOTE — Patient Instructions (Signed)

## 2016-10-11 ENCOUNTER — Ambulatory Visit: Payer: Self-pay

## 2016-10-16 ENCOUNTER — Ambulatory Visit: Payer: Medicare Other

## 2016-10-16 ENCOUNTER — Ambulatory Visit (INDEPENDENT_AMBULATORY_CARE_PROVIDER_SITE_OTHER): Payer: Medicare Other | Admitting: Urology

## 2016-10-16 VITALS — BP 117/78 | HR 73 | Ht 73.0 in | Wt 204.5 lb

## 2016-10-16 DIAGNOSIS — R3121 Asymptomatic microscopic hematuria: Secondary | ICD-10-CM | POA: Diagnosis not present

## 2016-10-16 DIAGNOSIS — N281 Cyst of kidney, acquired: Secondary | ICD-10-CM

## 2016-10-16 DIAGNOSIS — N200 Calculus of kidney: Secondary | ICD-10-CM

## 2016-10-16 LAB — MICROSCOPIC EXAMINATION: Epithelial Cells (non renal): NONE SEEN /hpf (ref 0–10)

## 2016-10-16 LAB — URINALYSIS, COMPLETE
Bilirubin, UA: NEGATIVE
Glucose, UA: NEGATIVE
Ketones, UA: NEGATIVE
Nitrite, UA: NEGATIVE
Protein, UA: NEGATIVE
RBC, UA: NEGATIVE
Specific Gravity, UA: 1.015 (ref 1.005–1.030)
Urobilinogen, Ur: 0.2 mg/dL (ref 0.2–1.0)
pH, UA: 6.5 (ref 5.0–7.5)

## 2016-10-16 MED ORDER — LIDOCAINE HCL 2 % EX GEL
1.0000 "application " | Freq: Once | CUTANEOUS | Status: AC
  Administered 2016-10-16: 1 via URETHRAL

## 2016-10-16 MED ORDER — CIPROFLOXACIN HCL 500 MG PO TABS
500.0000 mg | ORAL_TABLET | Freq: Once | ORAL | Status: AC
  Administered 2016-10-16: 500 mg via ORAL

## 2016-10-16 NOTE — Progress Notes (Signed)
   10/16/16  CC:  Chief Complaint  Patient presents with  . Cysto    HPI: The patient is a 76 year old gentleman with a past medical history of nephrolithiasis requiring lithotripsy presents today for evaluation of microscopic hematuria. He did have a CT abdomen and pelvis with contrast only recently. There are multiple urological findings. He had multiple large calculi bilaterally. The largest on the right is in the lower pole and is approximately 1.4 cm. On the left the largest is approximately 1.8 cm. There are also multiple 5-8 mm stones throughout each kidney bilaterally. Hounsfield units were up to 1500. There is also 1.2 cm right lower pole lesion that is not completely characterized. He also has bilateral up to 7 cm benign simple renal cysts.    He has a significant history of nephrolithiasis. His stones have been calcium oxalate in the past. He has passed multiple stones in the past. He is required surgery 3 times. His lithotripsy once ureteroscopy twice. He has not passed a stone in over a year. He does have a CT report from 2008 that notes his current larger stones were probably half the current size at that time.  He also has microscopic hematuria. He has not seen recent gross hematuria. He has had gross hematuria and passing stones before. He has had none recently.  His main complaint today is lower abdominal pain. He feels it's related to his previous colon resection along his Pfannenstiel scar. His CT scan showed no obvious source for this.  He returns today after undergoing an MRI renal protocol for the above renal lesion. He did have a 0.9 cm Bosniak 47F cyst. All other cysts were benign.  There were no vitals taken for this visit. NED. A&Ox3.   No respiratory distress   Abd soft, NT, ND Normal phallus with bilateral descended testicles  Cystoscopy Procedure Note  Patient identification was confirmed, informed consent was obtained, and patient was prepped using Betadine  solution.  Lidocaine jelly was administered per urethral meatus.    Preoperative abx where received prior to procedure.     Pre-Procedure: - Inspection reveals a normal caliber ureteral meatus.  Procedure: The flexible cystoscope was introduced without difficulty - No urethral strictures/lesions are present. - Enlarged prostate. Approximately 5 cm. Visually obstructive. - Normal bladder neck - Bilateral ureteral orifices identified - Bladder mucosa  reveals no ulcers, tumors, or lesions - No bladder stones - No trabeculation  Retroflexion shows no significant intravesical lobe   Post-Procedure: - Patient tolerated the procedure well  Assessment/ Plan:  1. Bosniak 47F right renal mass -Renal ultrasound in 6 months to monitor size  2. Microscopic hematuria Negative work up except for renal stones  3. Bilateral nephrolithiasis I discussed the patient and she would likely need staged PCNL bilaterally at the very least to render him stone free. He is not currently interested in surgical management of this. He would like to continue to manage it conservatively as long as she remains asymptomatic. Will check KUB to monitor size at 6 months with above KUB.

## 2016-10-17 ENCOUNTER — Other Ambulatory Visit: Payer: Self-pay | Admitting: Family Medicine

## 2016-10-17 ENCOUNTER — Other Ambulatory Visit: Payer: Medicare Other

## 2016-10-18 NOTE — Telephone Encounter (Signed)
Please advise for refills, last OV was 09/09/16, with you and last refill was 09/06/16, from Dr. Lacinda Axon, thanks

## 2016-10-21 ENCOUNTER — Telehealth: Payer: Self-pay | Admitting: Family

## 2016-10-21 DIAGNOSIS — G8929 Other chronic pain: Secondary | ICD-10-CM

## 2016-10-21 DIAGNOSIS — M545 Low back pain: Principal | ICD-10-CM

## 2016-10-21 MED ORDER — TRAMADOL HCL 50 MG PO TABS: 50.0000 mg | ORAL_TABLET | Freq: Three times a day (TID) | ORAL | 0 refills | Status: DC | PRN

## 2016-10-21 NOTE — Telephone Encounter (Signed)
Patient stated he take sone in the morning and one a night and that helps with his pain. He has been taking it everyday.  He did state that if the arthritis is very bad he may take three aday but has not had to as of late. Please advise.

## 2016-10-21 NOTE — Telephone Encounter (Signed)
Left message for patient to return call back.  

## 2016-10-21 NOTE — Telephone Encounter (Signed)
Please call pt  How often using tramadol? I thought prn. Looks like he is taking every day Is his back pain stable or worsening?  Ensure he knows side effects of this medication are seizure and that is is a controlled substance  I looked up patient on [place] Controlled Substances Reporting System and saw no activity that raised concern of inappropriate use.

## 2016-10-21 NOTE — Progress Notes (Signed)
Subjective:    Patient ID: [name] [name], male    DOB: Oct 25, 1940, 76 y.o.   MRN: [id number]  CC: [name] [name] is a 76 y.o. male who presents today for an acute visit.    HPI: Follow up from visit in which had been having lower abdominal pain.  Notes recent root canal. Prior to, started on amoxicillin from dentist, and notes within 4 days of starting amoxicillin, 'thinks amoxicllin cleared abominal pain' . Since stopped amoxicllin and pain has recurred every 4-5 days, especially with cheese notes pain would increase. No longer having diarrhea. No bloody stools  HTN- compliant with medication. Denies exertional chest pain or pressure, numbness or tingling radiating to left arm or jaw, palpitations, dizziness, frequent headaches, changes in vision, or shortness of breath.   DM- diet controlled. No longer on metformin.   Fatty liver- has lost 40 lbs intentionally over past year and half. Doesn't want to be on medication for DM.   Chronic low back pain- Worsened if driving a lot, lifting. Takes tramadol BID, 'not sure effective as been on so long.' uses aleve very sparingly due to HTN, aleve works well.   BPH- on proscar. Doesn't recall ever having PSA checked. Denies decreased urine flow, hesitancy  Urology - dr Pilar Jarvis 10/2016-  Right renal mass- repeat US in 6 months and KUB to monitor Bilateral nephrolithiasis. Patient notes father had right renal cancer.   Cytoscope done 10/3  HLD- on liptor, fenofibrate      10/3 [facility]- reviews MRI renal for large calculi bilaterally, multiple renal stones. Lower abdominal pain from colon resection. No obvious source for this. 0.9cm bosniak 91f cyst- all others benign. Renal US to monitor size in 6 months.  Microscopy hematuria- negative except for renal stones  Bilateral  Nephrolithiasis- advised staged PCNL ; he would like to makes conservatively. Will follow with KUB to monitor size in 6 months.   DG Lumbar spine 2017  HISTORY:    Past Medical History:  Diagnosis Date  . Arthritis   . CAD (coronary artery disease)   . Chicken pox   . Colon polyps    4 pre-cancerous   . Diverticulitis   . DM type 2 (diabetes mellitus, type 2) ([place])   . Hyperlipidemia   . Hypertension   . Kidney stones   . Melanoma ([place]) 1980   Resected from his back   Past Surgical History:  Procedure Laterality Date  . CHOLECYSTECTOMY  2012  . CORONARY ANGIOPLASTY WITH STENT PLACEMENT  1991 & 2005  . LITHOTRIPSY  2015  . MELANOMA EXCISION  1980   malignant  . NERVE SURGERY  2015   ulna nerve  . TONSILLECTOMY  1945  . WISDOM TOOTH EXTRACTION     Family History  Problem Relation Age of Onset  . Hyperlipidemia Mother   . Hypertension Mother   . Heart disease Mother   . Diabetes Mother   . Colon cancer Father   . Lung cancer Father   . Kidney cancer Father        malignant capsulated kidney tumor  . Diabetes Father   . Bladder Cancer Neg Hx   . Prostate cancer Neg Hx     Allergies: Azithromycin Current Outpatient Prescriptions on File Prior to Visit  Medication Sig Dispense Refill  . aspirin EC 81 MG tablet Take by mouth daily.     [name] atorvastatin (LIPITOR) 80 MG tablet Take 1 tablet (80 mg total) by mouth daily. 90 tablet 3  .  Calcium Polycarbophil (FIBER-CAPS PO) Take by mouth daily.    . clonazePAM (KLONOPIN) 0.5 MG tablet TAKE 1 TABLET BY MOUTH TWICE DAILY AS NEEDED FOR ANXIETY 60 tablet 3  . fenofibrate 160 MG tablet TAKE 1 TABLET BY MOUTH EVERY DAY 90 tablet 3  . fluticasone (FLONASE) 50 MCG/ACT nasal spray Place 2 sprays into both nostrils daily. 16 g 6  . glucose blood test strip Use as instructed to check blood sugar up to three times daily. E11.42 100 each 12  . hydrochlorothiazide (HYDRODIURIL) 25 MG tablet TAKE 1 TABLET BY MOUTH EVERY DAY 90 tablet 3  . lisinopril (PRINIVIL,ZESTRIL) 10 MG tablet TAKE 1 TABLET BY MOUTH DAILY 90 tablet 3  . metoprolol succinate (TOPROL-XL) 25 MG 24 hr tablet TAKE 1/2 TABLET BY MOUTH  TWICE DAILY 90 tablet 3  . Multiple Vitamin (MULTIVITAMIN) capsule Take 1 capsule by mouth daily.    . Omega-3 Fatty Acids (FISH OIL PO) Take by mouth daily.    . tamsulosin (FLOMAX) 0.4 MG CAPS capsule Take 1 capsule (0.4 mg total) by mouth daily. 90 capsule 3  . traMADol (ULTRAM) 50 MG tablet Take 1 tablet (50 mg total) by mouth every 8 (eight) hours as needed. 90 tablet 0   No current facility-administered medications on file prior to visit.     Social History  Substance Use Topics  . Smoking status: Former Smoker    Types: Pipe  . Smokeless tobacco: Former Systems developer    Quit date: 10/17/2015  . Alcohol use 0.0 - 0.6 oz/week    Review of Systems  Constitutional: Negative for chills and fever.  Respiratory: Negative for cough.   Cardiovascular: Negative for chest pain and palpitations.  Gastrointestinal: Positive for abdominal pain. Negative for blood in stool, constipation, diarrhea, nausea and vomiting.  Genitourinary: Negative for difficulty urinating and hematuria.  Musculoskeletal: Positive for back pain.  Neurological: Negative for numbness.      Objective:    BP 120/66   Pulse (!) 53   Temp 97.8 F (36.6 C) (Oral)   Ht 6\' 1"  (1.854 m)   Wt 205 lb 12.8 oz (93.4 kg)   SpO2 97%   BMI 27.15 kg/m   Wt Readings from Last 3 Encounters:  10/31/16 205 lb 12.8 oz (93.4 kg)  10/16/16 204 lb 8 oz (92.8 kg)  10/08/16 206 lb (93.4 kg)    Physical Exam  Constitutional: He appears well-developed and well-nourished.  Cardiovascular: Regular rhythm and normal heart sounds.   Pulmonary/Chest: Effort normal and breath sounds normal. No respiratory distress. He has no wheezes. He has no rhonchi. He has no rales.  Neurological: He is alert.  Skin: Skin is warm and dry.  Psychiatric: He has a normal mood and affect. His speech is normal and behavior is normal.  Vitals reviewed.      Assessment & Plan:   Problem List Items Addressed This Visit      Cardiovascular and  Mediastinum   Essential hypertension    Well controlled. Continue current regimen        Digestive   Fatty liver    Seeing Dr [name] next month. Normal LFTs. Congratulated patient on weight  Loss and given education on fatty liver diet.  Will follow.         Endocrine   Diabetes mellitus ([place])    Diet controlled. Pending a1c      Relevant Orders   Hemoglobin A1c     Genitourinary   BPH (benign prostatic hyperplasia)  Clinically asymptomatic. Enlarged as seen on CT abdomen. Pending psa. Follows with urology.       Relevant Orders   PSA     Other   Chronic low back pain    Chronic. Unchanged.  Stable on tramadol. Will continue. Discussed PT in future- he will let me know.       Relevant Medications   lidocaine (LIDODERM) 5 %   Right renal mass - Primary    Seen on Ct Abdomen and MRI. Discussed with Pilar Jarvis and will repeat US in 6 months to follow size. Will follow.       Abdominal pain    Improved. Nonspecific. No cause seen on Ct Ab. Discussed improvement on amoxicilllin and h/o diverticulitis. Also discussed possible lactose intolerance. Seeing dr [name] for fatty liver and diffuse thickening of stomach to ensure no further evaluation needed. Will follow.       Hematuria    Follows with Dr Pilar Jarvis. Normal cytoscope. Will follow.             I am having Mr. Bunt start on lidocaine. I am also having him maintain his aspirin EC, fluticasone, fenofibrate, atorvastatin, tamsulosin, hydrochlorothiazide, metoprolol succinate, Omega-3 Fatty Acids (FISH OIL PO), Calcium Polycarbophil (FIBER-CAPS PO), multivitamin, lisinopril, glucose blood, clonazePAM, and traMADol.   Meds ordered this encounter  Medications  . lidocaine (LIDODERM) 5 %    Sig: Place 1 patch onto the skin daily. Remove & Discard patch within 12 hours.    Dispense:  30 patch    Refill:  2    Order Specific Question:   Supervising Provider    Answer:   Crecencio Mc [[id number]]    Return precautions  given.   Risks, benefits, and alternatives of the medications and treatment plan prescribed today were discussed, and patient expressed understanding.   Education regarding symptom management and diagnosis given to patient on AVS.  Continue to follow with [name], [name] for routine health maintenance.   [name] and I agreed with plan.   [name], [name]

## 2016-10-21 NOTE — Telephone Encounter (Signed)
I have reordered.   We can discuss back pain and management when I see him in person

## 2016-10-31 ENCOUNTER — Ambulatory Visit: Payer: Medicare Other | Admitting: Family Medicine

## 2016-10-31 ENCOUNTER — Encounter: Payer: Self-pay | Admitting: Family

## 2016-10-31 ENCOUNTER — Ambulatory Visit (INDEPENDENT_AMBULATORY_CARE_PROVIDER_SITE_OTHER): Payer: Medicare Other | Admitting: Family

## 2016-10-31 VITALS — BP 120/66 | HR 53 | Temp 97.8°F | Ht 73.0 in | Wt 205.8 lb

## 2016-10-31 DIAGNOSIS — R109 Unspecified abdominal pain: Secondary | ICD-10-CM | POA: Insufficient documentation

## 2016-10-31 DIAGNOSIS — I2510 Atherosclerotic heart disease of native coronary artery without angina pectoris: Secondary | ICD-10-CM

## 2016-10-31 DIAGNOSIS — K760 Fatty (change of) liver, not elsewhere classified: Secondary | ICD-10-CM

## 2016-10-31 DIAGNOSIS — R1030 Lower abdominal pain, unspecified: Secondary | ICD-10-CM

## 2016-10-31 DIAGNOSIS — N2889 Other specified disorders of kidney and ureter: Secondary | ICD-10-CM | POA: Diagnosis not present

## 2016-10-31 DIAGNOSIS — G8929 Other chronic pain: Secondary | ICD-10-CM

## 2016-10-31 DIAGNOSIS — M545 Low back pain: Secondary | ICD-10-CM | POA: Diagnosis not present

## 2016-10-31 DIAGNOSIS — R319 Hematuria, unspecified: Secondary | ICD-10-CM

## 2016-10-31 DIAGNOSIS — E1159 Type 2 diabetes mellitus with other circulatory complications: Secondary | ICD-10-CM | POA: Diagnosis not present

## 2016-10-31 DIAGNOSIS — I10 Essential (primary) hypertension: Secondary | ICD-10-CM | POA: Diagnosis not present

## 2016-10-31 DIAGNOSIS — N400 Enlarged prostate without lower urinary tract symptoms: Secondary | ICD-10-CM

## 2016-10-31 LAB — PSA: PSA: 0.54 ng/mL (ref 0.10–4.00)

## 2016-10-31 LAB — HEMOGLOBIN A1C: Hgb A1c MFr Bld: 7.2 % — ABNORMAL HIGH (ref 4.6–6.5)

## 2016-10-31 MED ORDER — LIDOCAINE 5 % EX PTCH: 1.0000 | MEDICATED_PATCH | CUTANEOUS | 2 refills | Status: DC

## 2016-10-31 NOTE — Assessment & Plan Note (Signed)
Chronic. Unchanged.  Stable on tramadol. Will continue. Discussed PT in future- he will let me know.

## 2016-10-31 NOTE — Assessment & Plan Note (Addendum)
Clinically asymptomatic. Enlarged as seen on CT abdomen. Pending psa. Follows with urology.

## 2016-10-31 NOTE — Assessment & Plan Note (Signed)
Seen on Ct Abdomen and MRI. Discussed with Pilar Jarvis and will repeat US in 6 months to follow size. Will follow.

## 2016-10-31 NOTE — Assessment & Plan Note (Addendum)
Follows with Dr Pilar Jarvis. Normal cytoscope. Will follow.

## 2016-10-31 NOTE — Patient Instructions (Addendum)
Labs today.   Pleasure seeing you and I will be following notes with Dr Pilar Jarvis and [name]  Pleasure seeing you!     Nonalcoholic Fatty Liver Disease Diet Introduction Nonalcoholic fatty liver disease is a condition that causes fat to accumulate in and around the liver. The disease makes it harder for the liver to work the way that it should. Following a healthy diet can help to keep nonalcoholic fatty liver disease under control. It can also help to prevent or improve conditions that are associated with the disease, such as heart disease, diabetes, high blood pressure, and abnormal cholesterol levels. Along with regular exercise, this diet:  Promotes weight loss.  Helps to control blood sugar levels.  Helps to improve the way that the body uses insulin. What do I need to know about this diet?  Use the glycemic index (GI) to plan your meals. The index tells you how quickly a food will raise your blood sugar. Choose low-GI foods. These foods take a longer time to raise blood sugar.  Keep track of how many calories you take in. Eating the right amount of calories will help you to achieve a healthy weight.  You may want to follow a Mediterranean diet. This diet includes a lot of vegetables, lean meats or fish, whole grains, fruits, and healthy oils and fats. What foods can I eat? Grains  Whole grains, such as whole-wheat or whole-grain breads, crackers, tortillas, cereals, and pasta. Stone-ground whole wheat. Pumpernickel bread. Unsweetened oatmeal. Bulgur. Barley. Quinoa. Brown or wild rice. Corn or whole-wheat flour tortillas. Vegetables  Lettuce. Spinach. Peas. Beets. Cauliflower. Cabbage. Broccoli. Carrots. Tomatoes. Squash. Eggplant. Herbs. Peppers. Onions. Cucumbers. Brussels sprouts. Yams and sweet potatoes. Beans. Lentils. Fruits  Bananas. Apples. Oranges. Grapes. Papaya. Mango. Pomegranate. Kiwi. Grapefruit. Cherries. Meats and Other Protein Sources  Seafood and shellfish. Lean  meats. Poultry. Tofu. Dairy  Low-fat or fat-free dairy products, such as yogurt, cottage cheese, and cheese. Beverages  Water. Sugar-free drinks. Tea. Coffee. Low-fat or skim milk. Milk alternatives, such as soy or almond milk. Real fruit juice. Condiments  Mustard. Relish. Low-fat, low-sugar ketchup and barbecue sauce. Low-fat or fat-free mayonnaise. Sweets and Desserts  Sugar-free sweets. Fats and Oils  Avocado. Canola or olive oil. Nuts and nut butters. Seeds. The items listed above may not be a complete list of recommended foods or beverages. Contact your dietitian for more options.  What foods are not recommended? Palm oil and coconut oil. Processed foods. Fried foods. Sweetened drinks, such as sweet tea, milkshakes, snow cones, iced sweet drinks, and sodas. Alcohol. Sweets. Foods that contain a lot of salt or sodium. The items listed above may not be a complete list of foods and beverages to avoid. Contact your dietitian for more information.  This information is not intended to replace advice given to you by your health care provider. Make sure you discuss any questions you have with your health care provider. Document Released: 05/17/2014 Document Revised: 06/08/2015 Document Reviewed: 01/25/2014  2017 Elsevier  Fatty Liver Introduction  Fatty liver, also called hepatic steatosis or steatohepatitis, is a condition in which too much fat has built up in your liver cells. The liver removes harmful substances from your bloodstream. It produces fluids your body needs. It also helps your body use and store energy from the food you eat. In many cases, fatty liver does not cause symptoms or problems. It is often diagnosed when tests are being done for other reasons. However, over time, fatty liver can  cause inflammation that may lead to more serious liver problems, such as scarring of the liver (cirrhosis). What are the causes? Causes of fatty liver may include:  Drinking too much  alcohol.  Poor nutrition.  Obesity.  Cushing syndrome.  Diabetes.  Hyperlipidemia.  Pregnancy.  Certain drugs.  Poisons.  Some viral infections. What increases the risk? You may be more likely to develop fatty liver if you:  Abuse alcohol.  Are pregnant.  Are overweight.  Have diabetes.  Have hepatitis.  Have a high triglyceride level. What are the signs or symptoms? Fatty liver often does not cause any symptoms. In cases where symptoms develop, they can include:  Fatigue.  Weakness.  Weight loss.  Confusion.  Abdominal pain.  Yellowing of your skin and the white parts of your eyes (jaundice).  Nausea and vomiting. How is this diagnosed? Fatty liver may be diagnosed by:  Physical exam and medical history.  Blood tests.  Imaging tests, such as an ultrasound, CT scan, or MRI.  Liver biopsy. A small sample of liver tissue is removed using a needle. The sample is then looked at under a microscope. How is this treated? Fatty liver is often caused by other health conditions. Treatment for fatty liver may involve medicines and lifestyle changes to manage conditions such as:  Alcoholism.  High cholesterol.  Diabetes.  Being overweight or obese. Follow these instructions at home:  Eat a healthy diet as directed by your health care provider.  Exercise regularly. This can help you lose weight and control your cholesterol and diabetes. Talk to your health care provider about an exercise plan and which activities are best for you.  Do not drink alcohol.  Take medicines only as directed by your health care provider. Contact a health care provider if: You have difficulty controlling your:  Blood sugar.  Cholesterol.  Alcohol consumption. Get help right away if:  You have abdominal pain.  You have jaundice.  You have nausea and vomiting. This information is not intended to replace advice given to you by your health care provider. Make sure  you discuss any questions you have with your health care provider.

## 2016-10-31 NOTE — Assessment & Plan Note (Addendum)
Diet controlled. Pending a1c ?

## 2016-10-31 NOTE — Assessment & Plan Note (Signed)
Well-controlled.  Continue current regimen. 

## 2016-10-31 NOTE — Assessment & Plan Note (Addendum)
Improved. Nonspecific. No cause seen on Ct Ab. Discussed improvement on amoxicilllin and h/o diverticulitis. Also discussed possible lactose intolerance. Seeing dr [name] for fatty liver and diffuse thickening of stomach to ensure no further evaluation needed. Will follow.

## 2016-10-31 NOTE — Assessment & Plan Note (Addendum)
Seeing Dr [name] next month. Normal LFTs. Congratulated patient on weight  Loss and given education on fatty liver diet.  Will follow.

## 2016-11-05 ENCOUNTER — Ambulatory Visit (INDEPENDENT_AMBULATORY_CARE_PROVIDER_SITE_OTHER): Payer: Medicare Other | Admitting: Gastroenterology

## 2016-11-05 ENCOUNTER — Encounter: Payer: Self-pay | Admitting: Gastroenterology

## 2016-11-05 VITALS — BP 111/57 | HR 59 | Temp 98.0°F | Ht 73.0 in | Wt 205.0 lb

## 2016-11-05 DIAGNOSIS — R933 Abnormal findings on diagnostic imaging of other parts of digestive tract: Secondary | ICD-10-CM | POA: Diagnosis not present

## 2016-11-05 DIAGNOSIS — K760 Fatty (change of) liver, not elsewhere classified: Secondary | ICD-10-CM

## 2016-11-05 DIAGNOSIS — I2510 Atherosclerotic heart disease of native coronary artery without angina pectoris: Secondary | ICD-10-CM

## 2016-11-05 NOTE — Progress Notes (Signed)
Gastroenterology Consultation  Referring Provider:     [name], [name] Primary Care Physician:  [name], [name] Primary Gastroenterologist:  Dr. [name]     Reason for Consultation:     Thickened gastric wall and hepatic steatosis        HPI:   [name] [name] is a 76 y.o. y/o male referred for consultation & management of fatty liver and thickened gastric wall by Dr. Vidal Schwalbe, [name], [name].  This patient comes in today after being seen by her primary care provider with a CT scan showing the patient to have fatty liver and a thickened gastric wall that was reported to be thicker than typically seen on a decompressed stomach.  The patient had a follow-up MRI that did not show any abnormalities of the stomach.  It also did not show any findings mentioned in the liver. The patient also reports that he has lower abdominal pain that does not bother him in the morning but starts to worsen throughout the day.  He also reports that about the same place where he had surgery for intestinal resection.  He states that when he sits down he stopped having pain and also if he wears a brace he also has no pain.  Past Medical History:  Diagnosis Date  . Arthritis   . CAD (coronary artery disease)   . Chicken pox   . Colon polyps    4 pre-cancerous   . Diverticulitis   . DM type 2 (diabetes mellitus, type 2) ([place])   . Hyperlipidemia   . Hypertension   . Kidney stones   . Melanoma ([place]) 1980   Resected from his back    Past Surgical History:  Procedure Laterality Date  . CHOLECYSTECTOMY  2012  . CORONARY ANGIOPLASTY WITH STENT PLACEMENT  1991 & 2005  . LITHOTRIPSY  2015  . MELANOMA EXCISION  1980   malignant  . NERVE SURGERY  2015   ulna nerve  . TONSILLECTOMY  1945  . WISDOM TOOTH EXTRACTION      Prior to Admission medications   Medication Sig Start Date End Date Taking? Authorizing Provider  aspirin EC 81 MG tablet Take by mouth daily.     [provider]    atorvastatin (LIPITOR) 80 MG tablet Take 1 tablet (80 mg total) by mouth daily. 12/15/15   Coral Spikes, DO  Calcium Polycarbophil (FIBER-CAPS PO) Take by mouth daily.    [provider]  clonazePAM (KLONOPIN) 0.5 MG tablet TAKE 1 TABLET BY MOUTH TWICE DAILY AS NEEDED FOR ANXIETY 06/25/16   Cook, Michael Boston G, DO  fenofibrate 160 MG tablet TAKE 1 TABLET BY MOUTH EVERY DAY 11/16/15   Cook, Jayce G, DO  fluticasone (FLONASE) 50 MCG/ACT nasal spray Place 2 sprays into both nostrils daily. 10/03/15   Thersa Salt G, DO  glucose blood test strip Use as instructed to check blood sugar up to three times daily. E11.42 05/15/16   Coral Spikes, DO  hydrochlorothiazide (HYDRODIURIL) 25 MG tablet TAKE 1 TABLET BY MOUTH EVERY DAY 02/27/16   Thersa Salt G, DO  lidocaine (LIDODERM) 5 % Place 1 patch onto the skin daily. Remove & Discard patch within 12 hours. 10/31/16   [name], [name]  lisinopril (PRINIVIL,ZESTRIL) 10 MG tablet TAKE 1 TABLET BY MOUTH DAILY 04/10/16   Thersa Salt G, DO  metoprolol succinate (TOPROL-XL) 25 MG 24 hr tablet TAKE 1/2 TABLET BY MOUTH TWICE DAILY 03/11/16   Thersa Salt  G, DO  Multiple Vitamin (MULTIVITAMIN) capsule Take 1 capsule by mouth daily.    [provider]  Omega-3 Fatty Acids (FISH OIL PO) Take by mouth daily.    [provider]  tamsulosin (FLOMAX) 0.4 MG CAPS capsule Take 1 capsule (0.4 mg total) by mouth daily. 12/15/15   Coral Spikes, DO  traMADol (ULTRAM) 50 MG tablet Take 1 tablet (50 mg total) by mouth every 8 (eight) hours as needed. 10/21/16   [name], [name]    Family History  Problem Relation Age of Onset  . Hyperlipidemia Mother   . Hypertension Mother   . Heart disease Mother   . Diabetes Mother   . Colon cancer Father   . Lung cancer Father   . Kidney cancer Father        malignant capsulated kidney tumor  . Diabetes Father   . Bladder Cancer Neg Hx   . Prostate cancer Neg Hx      Social History  Substance Use Topics   . Smoking status: Former Smoker    Types: Pipe  . Smokeless tobacco: Former Systems developer    Quit date: 10/17/2015  . Alcohol use 0.0 - 0.6 oz/week    Allergies as of 11/05/2016 - Review Complete 10/31/2016  Allergen Reaction Noted  . Azithromycin  07/07/2015    Review of Systems:    All systems reviewed and negative except where noted in HPI.   Physical Exam:  There were no vitals taken for this visit. No LMP for male patient. Psych:  Alert and cooperative. Normal mood and affect. General:   Alert,  Well-developed, well-nourished, pleasant and cooperative in NAD Head:  Normocephalic and atraumatic. Eyes:  Sclera clear, no icterus.   Conjunctiva pink. Ears:  Normal auditory acuity. Nose:  No deformity, discharge, or lesions. Mouth:  No deformity or lesions,oropharynx pink & moist. Neck:  Supple; no masses or thyromegaly. Lungs:  Respirations even and unlabored.  Clear throughout to auscultation.   No wheezes, crackles, or rhonchi. No acute distress. Heart:  Regular rate and rhythm; no murmurs, clicks, rubs, or gallops. Abdomen:  Normal bowel sounds.  No bruits.  Soft, non-tender and non-distended without masses, hepatosplenomegaly or hernias noted.  No guarding or rebound tenderness.  Negative Carnett sign.   Rectal:  Deferred.  Msk:  Symmetrical without gross deformities.  Good, equal movement & strength bilaterally. Pulses:  Normal pulses noted. Extremities:  No clubbing or edema.  No cyanosis. Neurologic:  Alert and oriented x3;  grossly normal neurologically. Skin:  Intact without significant lesions or rashes.  No jaundice. Lymph Nodes:  No significant cervical adenopathy. Psych:  Alert and cooperative. Normal mood and affect.  Imaging Studies: No results found.  Assessment and Plan:   [name] [name] is a 76 y.o. y/o male With fatty liver with out any abnormal liver enzymes.  The patient has been explained that without abnormal liver enzymes the fat is not causing any  damage and he should have his liver enzymes continued to be monitored.  He has been on a diet and has lost 40 pounds in the last year.  He was also found to have a thickened gastric wall on his MRI and needs to be set up for an EGD.  The patient will be set up for an EGD.  The patient has been explained that his abdominal pain in the lower abdomen is consistent with musculoskeletal pain and no further workup is needed for the lower abdominal pain.  Adely Facer  [name], MD. [name]   Note: This dictation was prepared with Dragon dictation along with smaller phrase technology. Any transcriptional errors that result from this process are unintentional.

## 2016-11-07 ENCOUNTER — Other Ambulatory Visit: Payer: Self-pay

## 2016-11-07 DIAGNOSIS — K3189 Other diseases of stomach and duodenum: Secondary | ICD-10-CM

## 2016-11-13 ENCOUNTER — Other Ambulatory Visit: Payer: Self-pay | Admitting: Family Medicine

## 2016-11-19 ENCOUNTER — Ambulatory Visit: Payer: Medicare Other | Admitting: Anesthesiology

## 2016-11-19 ENCOUNTER — Ambulatory Visit
Admission: RE | Admit: 2016-11-19 | Discharge: 2016-11-19 | Disposition: A | Discharge status: Home / Self Care | Payer: Medicare Other | Source: Ambulatory Visit | Attending: Gastroenterology | Admitting: Gastroenterology

## 2016-11-19 ENCOUNTER — Other Ambulatory Visit: Payer: Self-pay | Admitting: Gastroenterology

## 2016-11-19 ENCOUNTER — Encounter
Admission: RE | Disposition: A | Discharge status: Home / Self Care | Payer: Self-pay | Source: Ambulatory Visit | Attending: Gastroenterology

## 2016-11-19 DIAGNOSIS — F17210 Nicotine dependence, cigarettes, uncomplicated: Secondary | ICD-10-CM | POA: Diagnosis not present

## 2016-11-19 DIAGNOSIS — Z881 Allergy status to other antibiotic agents status: Secondary | ICD-10-CM | POA: Insufficient documentation

## 2016-11-19 DIAGNOSIS — K209 Esophagitis, unspecified: Secondary | ICD-10-CM | POA: Diagnosis not present

## 2016-11-19 DIAGNOSIS — K2210 Ulcer of esophagus without bleeding: Secondary | ICD-10-CM | POA: Insufficient documentation

## 2016-11-19 DIAGNOSIS — Z79899 Other long term (current) drug therapy: Secondary | ICD-10-CM | POA: Diagnosis not present

## 2016-11-19 DIAGNOSIS — E119 Type 2 diabetes mellitus without complications: Secondary | ICD-10-CM | POA: Diagnosis not present

## 2016-11-19 DIAGNOSIS — Z8719 Personal history of other diseases of the digestive system: Secondary | ICD-10-CM | POA: Diagnosis not present

## 2016-11-19 DIAGNOSIS — R634 Abnormal weight loss: Secondary | ICD-10-CM | POA: Diagnosis not present

## 2016-11-19 DIAGNOSIS — I2510 Atherosclerotic heart disease of native coronary artery without angina pectoris: Secondary | ICD-10-CM | POA: Diagnosis not present

## 2016-11-19 DIAGNOSIS — R933 Abnormal findings on diagnostic imaging of other parts of digestive tract: Secondary | ICD-10-CM | POA: Diagnosis not present

## 2016-11-19 DIAGNOSIS — K3189 Other diseases of stomach and duodenum: Secondary | ICD-10-CM

## 2016-11-19 DIAGNOSIS — M1990 Unspecified osteoarthritis, unspecified site: Secondary | ICD-10-CM | POA: Diagnosis not present

## 2016-11-19 DIAGNOSIS — K2950 Unspecified chronic gastritis without bleeding: Secondary | ICD-10-CM | POA: Diagnosis not present

## 2016-11-19 DIAGNOSIS — K2970 Gastritis, unspecified, without bleeding: Secondary | ICD-10-CM

## 2016-11-19 DIAGNOSIS — I252 Old myocardial infarction: Secondary | ICD-10-CM | POA: Diagnosis not present

## 2016-11-19 DIAGNOSIS — K2980 Duodenitis without bleeding: Secondary | ICD-10-CM | POA: Diagnosis not present

## 2016-11-19 DIAGNOSIS — R935 Abnormal findings on diagnostic imaging of other abdominal regions, including retroperitoneum: Secondary | ICD-10-CM | POA: Diagnosis present

## 2016-11-19 DIAGNOSIS — I10 Essential (primary) hypertension: Secondary | ICD-10-CM | POA: Diagnosis not present

## 2016-11-19 DIAGNOSIS — Z7982 Long term (current) use of aspirin: Secondary | ICD-10-CM | POA: Insufficient documentation

## 2016-11-19 DIAGNOSIS — K210 Gastro-esophageal reflux disease with esophagitis: Secondary | ICD-10-CM | POA: Insufficient documentation

## 2016-11-19 DIAGNOSIS — Z7984 Long term (current) use of oral hypoglycemic drugs: Secondary | ICD-10-CM | POA: Diagnosis not present

## 2016-11-19 DIAGNOSIS — E1151 Type 2 diabetes mellitus with diabetic peripheral angiopathy without gangrene: Secondary | ICD-10-CM | POA: Diagnosis not present

## 2016-11-19 DIAGNOSIS — E785 Hyperlipidemia, unspecified: Secondary | ICD-10-CM | POA: Diagnosis not present

## 2016-11-19 DIAGNOSIS — I739 Peripheral vascular disease, unspecified: Secondary | ICD-10-CM | POA: Diagnosis not present

## 2016-11-19 DIAGNOSIS — K2990 Gastroduodenitis, unspecified, without bleeding: Secondary | ICD-10-CM | POA: Diagnosis not present

## 2016-11-19 HISTORY — DX: Acute myocardial infarction, unspecified: I21.9

## 2016-11-19 HISTORY — DX: Personal history of urinary calculi: Z87.442

## 2016-11-19 HISTORY — PX: ESOPHAGOGASTRODUODENOSCOPY (EGD) WITH PROPOFOL: SHX5813

## 2016-11-19 LAB — GLUCOSE, CAPILLARY: Glucose-Capillary: 140 mg/dL — ABNORMAL HIGH (ref 65–99)

## 2016-11-19 SURGERY — ESOPHAGOGASTRODUODENOSCOPY (EGD) WITH PROPOFOL
Anesthesia: General

## 2016-11-19 MED ORDER — GLYCOPYRROLATE 0.2 MG/ML IJ SOLN
INTRAMUSCULAR | Status: DC | PRN
  Administered 2016-11-19: 0.2 mg via INTRAVENOUS

## 2016-11-19 MED ORDER — LIDOCAINE HCL (CARDIAC) 20 MG/ML IV SOLN
INTRAVENOUS | Status: DC | PRN
  Administered 2016-11-19: 100 mg via INTRAVENOUS

## 2016-11-19 MED ORDER — PANTOPRAZOLE SODIUM 40 MG PO TBEC: 40.0000 mg | DELAYED_RELEASE_TABLET | Freq: Two times a day (BID) | ORAL | 11 refills | Status: DC

## 2016-11-19 MED ORDER — LIDOCAINE HCL 2 % EX GEL
CUTANEOUS | Status: AC
  Filled 2016-11-19: qty 5

## 2016-11-19 MED ORDER — PROPOFOL 10 MG/ML IV BOLUS
INTRAVENOUS | Status: DC | PRN
  Administered 2016-11-19: 70 mg via INTRAVENOUS

## 2016-11-19 MED ORDER — PROPOFOL 10 MG/ML IV BOLUS
INTRAVENOUS | Status: AC
  Filled 2016-11-19: qty 60

## 2016-11-19 MED ORDER — SODIUM CHLORIDE 0.9 % IV SOLN
INTRAVENOUS | Status: DC
  Administered 2016-11-19 (×2): via INTRAVENOUS

## 2016-11-19 MED ORDER — PROPOFOL 500 MG/50ML IV EMUL
INTRAVENOUS | Status: DC | PRN
  Administered 2016-11-19: 100 ug/kg/min via INTRAVENOUS

## 2016-11-19 NOTE — Anesthesia Preprocedure Evaluation (Signed)
Anesthesia Evaluation  Patient identified by MRN, date of birth, ID band Patient awake    Reviewed: Allergy & Precautions, H&P , NPO status , Patient's Chart, lab work & pertinent test results  History of Anesthesia Complications Negative for: history of anesthetic complications  Airway Mallampati: III  TM Distance: <3 FB Neck ROM: limited    Dental  (+) Chipped, Poor Dentition, Missing, Caps   Pulmonary neg shortness of breath, Current Smoker,           Cardiovascular Exercise Tolerance: Good hypertension, + CAD, + Past MI, + Cardiac Stents and + Peripheral Vascular Disease  + dysrhythmias      Neuro/Psych negative neurological ROS  negative psych ROS   GI/Hepatic negative GI ROS, Neg liver ROS,   Endo/Other  diabetes  Renal/GU Renal diseasenegative Renal ROS  negative genitourinary   Musculoskeletal  (+) Arthritis ,   Abdominal   Peds  Hematology negative hematology ROS (+)   Anesthesia Other Findings Past Medical History: No date: Arthritis No date: CAD (coronary artery disease) No date: Chicken pox No date: Colon polyps     Comment:  4 pre-cancerous  No date: Diverticulitis No date: DM type 2 (diabetes mellitus, type 2) ([facility]) No date: Dysrhythmia No date: History of kidney stones No date: Hyperlipidemia No date: Hypertension No date: Kidney stones 1980: Melanoma ([place])     Comment:  Resected from his back No date: Myocardial infarction [facility])  Past Surgical History: 2012: CHOLECYSTECTOMY No date: COLONOSCOPY 1991 & 2005: CORONARY ANGIOPLASTY WITH STENT PLACEMENT 2015: LITHOTRIPSY 1980: MELANOMA EXCISION     Comment:  malignant 2015: NERVE SURGERY     Comment:  ulna nerve 1945: TONSILLECTOMY No date: WISDOM TOOTH EXTRACTION     Reproductive/Obstetrics negative OB ROS                            Anesthesia Physical Anesthesia Plan  ASA: III  Anesthesia Plan:  General   Post-op Pain Management:    Induction: Intravenous  PONV Risk Score and Plan: 1 and Propofol infusion  Airway Management Planned: Natural Airway and Nasal Cannula  Additional Equipment:   Intra-op Plan:   Post-operative Plan:   Informed Consent: I have reviewed the patients History and Physical, chart, labs and discussed the procedure including the risks, benefits and alternatives for the proposed anesthesia with the patient or authorized representative who has indicated his/her understanding and acceptance.   Dental Advisory Given  Plan Discussed with: Anesthesiologist, CRNA and Surgeon  Anesthesia Plan Comments: (Patient consented for risks of anesthesia including but not limited to:  - adverse reactions to medications - risk of intubation if required - damage to teeth, lips or other oral mucosa - sore throat or hoarseness - Damage to heart, brain, lungs or loss of life  Patient voiced understanding.)       Anesthesia Quick Evaluation

## 2016-11-19 NOTE — H&P (Signed)
[name], MD [facility] [address] [place],  [address] Phone:[phone] Fax : [phone]  Primary Care Physician:  [name], [name] Primary Gastroenterologist:  Dr. [name]  Pre-Procedure History & Physical: HPI:  [name] [name] is a 76 y.o. male is here for an endoscopy.   Past Medical History:  Diagnosis Date  . Arthritis   . CAD (coronary artery disease)   . Chicken pox   . Colon polyps    4 pre-cancerous   . Diverticulitis   . DM type 2 (diabetes mellitus, type 2) ([place])   . Dysrhythmia   . History of kidney stones   . Hyperlipidemia   . Hypertension   . Kidney stones   . Melanoma ([place]) 1980   Resected from his back  . Myocardial infarction [facility])     Past Surgical History:  Procedure Laterality Date  . CHOLECYSTECTOMY  2012  . COLONOSCOPY    . CORONARY ANGIOPLASTY WITH STENT PLACEMENT  1991 & 2005  . LITHOTRIPSY  2015  . MELANOMA EXCISION  1980   malignant  . NERVE SURGERY  2015   ulna nerve  . TONSILLECTOMY  1945  . WISDOM TOOTH EXTRACTION      Prior to Admission medications   Medication Sig Start Date End Date Taking? Authorizing Provider  aspirin EC 81 MG tablet Take by mouth daily.    Yes [provider]  atorvastatin (LIPITOR) 80 MG tablet Take 1 tablet (80 mg total) by mouth daily. 12/15/15  Yes Cook, Jayce G, DO  Calcium Polycarbophil (FIBER-CAPS PO) Take by mouth daily.   Yes [provider]  clonazePAM (KLONOPIN) 0.5 MG tablet TAKE 1 TABLET BY MOUTH TWICE DAILY AS NEEDED FOR ANXIETY 06/25/16  Yes Cook, Jayce G, DO  fenofibrate 160 MG tablet TAKE 1 TABLET BY MOUTH EVERY DAY 11/14/16  Yes [name], [name], [name]  fluticasone (FLONASE) 50 MCG/ACT nasal spray Place 2 sprays into both nostrils daily. 10/03/15  Yes Cook, Jayce G, DO  hydrochlorothiazide (HYDRODIURIL) 25 MG tablet TAKE 1 TABLET BY MOUTH EVERY DAY 02/27/16  Yes Cook, Jayce G, DO  lisinopril (PRINIVIL,ZESTRIL) 10 MG tablet TAKE 1 TABLET BY MOUTH DAILY  04/10/16  Yes Cook, Jayce G, DO  metFORMIN (GLUCOPHAGE) 500 MG tablet Take 250 mg at bedtime by mouth.   Yes [provider]  metoprolol succinate (TOPROL-XL) 25 MG 24 hr tablet TAKE 1/2 TABLET BY MOUTH TWICE DAILY 03/11/16  Yes Thersa Salt G, DO  Multiple Vitamin (MULTIVITAMIN) capsule Take 1 capsule by mouth daily.   Yes [provider]  Omega-3 Fatty Acids (FISH OIL PO) Take by mouth daily.   Yes [provider]  tamsulosin (FLOMAX) 0.4 MG CAPS capsule Take 1 capsule (0.4 mg total) by mouth daily. 12/15/15  Yes Cook, Jayce G, DO  traMADol (ULTRAM) 50 MG tablet Take 1 tablet (50 mg total) by mouth every 8 (eight) hours as needed. 10/21/16  Yes [name], [name], [name]  glucose blood test strip Use as instructed to check blood sugar up to three times daily. E11.42 05/15/16   Coral Spikes, DO  lidocaine (LIDODERM) 5 % Place 1 patch onto the skin daily. Remove & Discard patch within 12 hours. Patient not taking: Reported on 11/19/2016 10/31/16   [name], [name]    Allergies as of 11/07/2016 - Review Complete 11/05/2016  Allergen Reaction Noted  . Azithromycin  07/07/2015    Family History  Problem Relation Age of Onset  . Hyperlipidemia  Mother   . Hypertension Mother   . Heart disease Mother   . Diabetes Mother   . Colon cancer Father   . Lung cancer Father   . Kidney cancer Father        malignant capsulated kidney tumor  . Diabetes Father   . Bladder Cancer Neg Hx   . Prostate cancer Neg Hx     Social History   Socioeconomic History  . Marital status: Married    Spouse name: Not on file  . Number of children: Not on file  . Years of education: Not on file  . Highest education level: Not on file  Social Needs  . Financial resource strain: Not on file  . Food insecurity - worry: Not on file  . Food insecurity - inability: Not on file  . Transportation needs - medical: Not on file  . Transportation needs - non-medical: Not on file  Occupational  History  . Not on file  Tobacco Use  . Smoking status: Current Some Day Smoker    Types: Pipe  . Smokeless tobacco: Former Systems developer    Quit date: 10/17/2015  Substance and Sexual Activity  . Alcohol use: Yes    Alcohol/week: 0.0 - 0.6 oz    Comment: none last 24hrs  . Drug use: No  . Sexual activity: Not on file  Other Topics Concern  . Not on file  Social History Narrative  . Not on file    Review of Systems: See HPI, otherwise negative ROS  Physical Exam: BP 116/74   Pulse (!) 57   Temp (!) 95.7 F (35.4 C) (Oral)   Resp 20   Ht 6\' 1"  (1.854 m)   Wt 201 lb (91.2 kg)   SpO2 97%   BMI 26.52 kg/m  General:   Alert,  pleasant and cooperative in NAD Head:  Normocephalic and atraumatic. Neck:  Supple; no masses or thyromegaly. Lungs:  Clear throughout to auscultation.    Heart:  Regular rate and rhythm. Abdomen:  Soft, nontender and nondistended. Normal bowel sounds, without guarding, and without rebound.   Neurologic:  Alert and  oriented x4;  grossly normal neurologically.  Impression/Plan: [name] [name] is here for an endoscopy to be performed for abnormal MRi and weight loss.  Risks, benefits, limitations, and alternatives regarding  endoscopy have been reviewed with the patient.  Questions have been answered.  All parties agreeable.   [name], MD  11/19/2016, 9:37 AM

## 2016-11-19 NOTE — Transfer of Care (Signed)
Immediate Anesthesia Transfer of Care Note  Patient: [name] [name]  Procedure(s) Performed: ESOPHAGOGASTRODUODENOSCOPY (EGD) WITH PROPOFOL (N/A )  Patient Location: PACU and Endoscopy Unit  Anesthesia Type:General  Level of Consciousness: drowsy and patient cooperative  Airway & Oxygen Therapy: Patient Spontanous Breathing and Patient connected to nasal cannula oxygen  Post-op Assessment: Report given to RN, Post -op Vital signs reviewed and stable and Patient moving all extremities  Post vital signs: Reviewed and stable  Last Vitals:  Vitals:   11/19/16 0853 11/19/16 0950  BP: 116/74 119/72  Pulse: (!) 57 (!) 55  Resp: 20 20  Temp: (!) 35.4 C 36.4 C  SpO2: 97% 97%    Last Pain:  Vitals:   11/19/16 0950  TempSrc: Tympanic  PainSc:          Complications: No apparent anesthesia complications

## 2016-11-19 NOTE — Anesthesia Post-op Follow-up Note (Signed)
Anesthesia QCDR form completed.        

## 2016-11-19 NOTE — Anesthesia Postprocedure Evaluation (Signed)
Anesthesia Post Note  Patient: Daryl Beehler  Procedure(s) Performed: ESOPHAGOGASTRODUODENOSCOPY (EGD) WITH PROPOFOL (N/A )  Patient location during evaluation: Endoscopy Anesthesia Type: General Level of consciousness: awake and alert Pain management: pain level controlled Vital Signs Assessment: post-procedure vital signs reviewed and stable Respiratory status: spontaneous breathing, nonlabored ventilation, respiratory function stable and patient connected to nasal cannula oxygen Cardiovascular status: blood pressure returned to baseline and stable Postop Assessment: no apparent nausea or vomiting Anesthetic complications: no     Last Vitals:  Vitals:   11/19/16 1010 11/19/16 1020  BP: 125/76 123/71  Pulse: (!) 44 (!) 53  Resp: 19 20  Temp:    SpO2: 96% 97%    Last Pain:  Vitals:   11/19/16 0950  TempSrc: Tympanic  PainSc:                  Precious Haws Dao Memmott

## 2016-11-19 NOTE — Op Note (Signed)
[facility] Patient Name: [name] [name] Procedure Date: 11/19/2016 9:32 AM MRN: [id number] Account #: [id number] Date of Birth: November 02, 1940 Admit Type: Outpatient Age: 76 Room: [facility] ENDO ROOM 4 Gender: Male Note Status: Finalized Procedure:            Upper GI endoscopy Indications:          Abnormal abdominal x-ray of the GI tract, Weight loss Providers:            [name] MD, MD Referring MD:         [name]. [name] (Referring MD) Medicines:            Propofol per Anesthesia Complications:        No immediate complications. Procedure:            Pre-Anesthesia Assessment:                       - Prior to the procedure, a History and Physical was                        performed, and patient medications and allergies were                        reviewed. The patient's tolerance of previous                        anesthesia was also reviewed. The risks and benefits of                        the procedure and the sedation options and risks were                        discussed with the patient. All questions were                        answered, and informed consent was obtained. Prior                        Anticoagulants: The patient has taken no previous                        anticoagulant or antiplatelet agents. ASA Grade                        Assessment: II - A patient with mild systemic disease.                        After reviewing the risks and benefits, the patient was                        deemed in satisfactory condition to undergo the                        procedure.                       After obtaining informed consent, the endoscope was                        passed under direct vision. Throughout the procedure,  the patient's blood pressure, pulse, and oxygen                        saturations were monitored continuously. The Endoscope                        was introduced through the mouth, and  advanced to the                        second part of duodenum. The upper GI endoscopy was                        accomplished without difficulty. The patient tolerated                        the procedure well. Findings:      Two superficial esophageal ulcers with no bleeding and no stigmata of       recent bleeding were found at the gastroesophageal junction. Biopsies       were taken with a cold forceps for histology.      LA Grade C (one or more mucosal breaks continuous between tops of 2 or       more mucosal folds, less than 75% circumference) esophagitis with no       bleeding was found in the lower third of the esophagus.      Localized moderate inflammation characterized by erosions was found in       the gastric antrum. Biopsies were taken with a cold forceps for       histology.      Localized moderate inflammation was found in the duodenal bulb. Impression:           - Non-bleeding esophageal ulcers. Biopsied.                       - LA Grade C reflux esophagitis.                       - Gastritis. Biopsied.                       - Duodenitis. Recommendation:       - Discharge patient to home.                       - Resume previous diet.                       - Continue present medications.                       - Use a proton pump inhibitor PO BID. Procedure Code(s):    --- Professional ---                       [phone], Esophagogastroduodenoscopy, flexible, transoral;                        with biopsy, single or multiple Diagnosis Code(s):    --- Professional ---                       R63.4, Abnormal weight loss  R93.3, Abnormal findings on diagnostic imaging of other                        parts of digestive tract                       K29.70, Gastritis, unspecified, without bleeding                       K21.0, Gastro-esophageal reflux disease with esophagitis                       K22.10, Ulcer of esophagus without bleeding CPT copyright 2016  American Medical Association. All rights reserved. The codes documented in this report are preliminary and upon coder review may  be revised to meet current compliance requirements. [name] MD, MD 11/19/2016 9:54:35 AM This report has been signed electronically. Number of Addenda: 0 Note Initiated On: 11/19/2016 9:32 AM      [facility]

## 2016-11-20 ENCOUNTER — Encounter: Payer: Self-pay | Admitting: Gastroenterology

## 2016-11-20 LAB — SURGICAL PATHOLOGY

## 2016-11-21 ENCOUNTER — Encounter: Payer: Self-pay | Admitting: Gastroenterology

## 2016-12-03 ENCOUNTER — Other Ambulatory Visit: Payer: Self-pay | Admitting: Family Medicine

## 2016-12-10 ENCOUNTER — Other Ambulatory Visit: Payer: Self-pay | Admitting: Internal Medicine

## 2016-12-10 DIAGNOSIS — G8929 Other chronic pain: Secondary | ICD-10-CM

## 2016-12-10 DIAGNOSIS — M545 Low back pain: Principal | ICD-10-CM

## 2016-12-10 NOTE — Telephone Encounter (Signed)
Refilled: 10/21/2016 Last OV: 10/31/2016 Next OV: not scheduled  [name] [name] pt.

## 2016-12-12 NOTE — Telephone Encounter (Signed)
REFILLED UNTIL MARGARETT;'S RETURN

## 2016-12-12 NOTE — Telephone Encounter (Signed)
Patient is still looking for his Tramadol medication refill w/ a quantity of 90.  The [facility] pharmacist on [address] sent the request in twice.  Patient said his will be completely out of the medication today.

## 2016-12-12 NOTE — Telephone Encounter (Signed)
Printed, faxed and signed.

## 2016-12-26 ENCOUNTER — Ambulatory Visit (INDEPENDENT_AMBULATORY_CARE_PROVIDER_SITE_OTHER): Payer: Medicare Other | Admitting: Gastroenterology

## 2016-12-26 ENCOUNTER — Encounter: Payer: Self-pay | Admitting: Gastroenterology

## 2016-12-26 VITALS — BP 131/79 | HR 67 | Ht 73.0 in | Wt 204.0 lb

## 2016-12-26 DIAGNOSIS — I2510 Atherosclerotic heart disease of native coronary artery without angina pectoris: Secondary | ICD-10-CM | POA: Diagnosis not present

## 2016-12-26 DIAGNOSIS — R1084 Generalized abdominal pain: Secondary | ICD-10-CM | POA: Diagnosis not present

## 2016-12-26 NOTE — Progress Notes (Signed)
Primary Care Physician: [name], [name]  Primary Gastroenterologist:  Dr. [name]  No chief complaint on file.   HPI: [name] [name] is a 76 y.o. male here for follow-up of abdominal pain. The patient had seen me in October for an abnormal CT scan with thickening of the gastric wall. The patient was reported to have a MRI that did not show this. The patient underwent an EGD that showed no sign of any malignancy but did show some gastritis and esophagitis. The biopsies of the stomach did not show any worrisome features. The patient had reported that he lost 40 pounds when he had previously seen me but stated he was trying to lose weight. The patient reports that he decrease the dose of his metformin and now his abdominal pain has been gone for 5 days. The patient states he has never had the pain being gone for so long. The patient also reports that he did have pain before with movement and shoveling snow in the past but recently shoveled snow without any pain. He thinks that there may have been a musculoskeletal component to his pain. The patient also states that he also feels better taking his proton pump inhibitor.  Current Outpatient Medications  Medication Sig Dispense Refill  . aspirin EC 81 MG tablet Take by mouth daily.     [name] atorvastatin (LIPITOR) 80 MG tablet TAKE 1 TABLET(80 MG) BY MOUTH DAILY 90 tablet 0  . Calcium Polycarbophil (FIBER-CAPS PO) Take by mouth daily.    . clonazePAM (KLONOPIN) 0.5 MG tablet TAKE 1 TABLET BY MOUTH TWICE DAILY AS NEEDED FOR ANXIETY 60 tablet 3  . fenofibrate 160 MG tablet TAKE 1 TABLET BY MOUTH EVERY DAY 90 tablet 1  . fluticasone (FLONASE) 50 MCG/ACT nasal spray Place 2 sprays into both nostrils daily. 16 g 6  . glucose blood test strip Use as instructed to check blood sugar up to three times daily. E11.42 100 each 12  . hydrochlorothiazide (HYDRODIURIL) 25 MG tablet TAKE 1 TABLET BY MOUTH EVERY DAY 90 tablet 3  . lidocaine (LIDODERM) 5  % Place 1 patch onto the skin daily. Remove & Discard patch within 12 hours. (Patient not taking: Reported on 11/19/2016) 30 patch 2  . lisinopril (PRINIVIL,ZESTRIL) 10 MG tablet TAKE 1 TABLET BY MOUTH DAILY 90 tablet 3  . metFORMIN (GLUCOPHAGE) 500 MG tablet Take 250 mg at bedtime by mouth.    . metoprolol succinate (TOPROL-XL) 25 MG 24 hr tablet TAKE 1/2 TABLET BY MOUTH TWICE DAILY 90 tablet 3  . Multiple Vitamin (MULTIVITAMIN) capsule Take 1 capsule by mouth daily.    . Omega-3 Fatty Acids (FISH OIL PO) Take by mouth daily.    . pantoprazole (PROTONIX) 40 MG tablet Take 1 tablet (40 mg total) 2 (two) times daily by mouth. 30 tablet 11  . tamsulosin (FLOMAX) 0.4 MG CAPS capsule TAKE 1 CAPSULE(0.4 MG) BY MOUTH DAILY 90 capsule 0  . traMADol (ULTRAM) 50 MG tablet TAKE 1 TABLET BY MOUTH EVERY 8 HOURS AS NEEDED 90 tablet 2   No current facility-administered medications for this visit.     Allergies as of 12/26/2016 - Review Complete 11/19/2016  Allergen Reaction Noted  . Azithromycin  07/07/2015    ROS:  General: Negative for anorexia, weight loss, fever, chills, fatigue, weakness. ENT: Negative for hoarseness, difficulty swallowing , nasal congestion. CV: Negative for chest pain, angina, palpitations, dyspnea on exertion, peripheral edema.  Respiratory: Negative for dyspnea at rest, dyspnea  on exertion, cough, sputum, wheezing.  GI: See history of present illness. GU:  Negative for dysuria, hematuria, urinary incontinence, urinary frequency, nocturnal urination.  Endo: Negative for unusual weight change.    Physical Examination:   There were no vitals taken for this visit.  General: Well-nourished, well-developed in no acute distress.  Eyes: No icterus. Conjunctivae pink. Mouth: Oropharyngeal mucosa moist and pink , no lesions erythema or exudate. Lungs: Clear to auscultation bilaterally. Non-labored. Heart: Regular rate and rhythm, no murmurs rubs or gallops.  Abdomen: Bowel  sounds are normal, nontender, nondistended, no hepatosplenomegaly or masses, no abdominal bruits or hernia , no rebound or guarding.   Extremities: No lower extremity edema. No clubbing or deformities. Neuro: Alert and oriented x 3.  Grossly intact. Skin: Warm and dry, no jaundice.   Psych: Alert and cooperative, normal mood and affect.  Labs:    Imaging Studies: No results found.  Assessment and Plan:   [name] [name] is a 76 y.o. y/o male who has a history of abdominal pain that has been constant every morning that now has resolved since he has decreased his metformin. The patient has been checking his sugars and states that his sugars have been stable. He also reports that he is not losing any more weight and is doing better with his PPI and lower dose of metformin. The patient is now pain free for the last 5 days and will contact me if his pain returns. The patient has been explained the plan and agrees with it.    [name], MD. [name]   Note: This dictation was prepared with Dragon dictation along with smaller phrase technology. Any transcriptional errors that result from this process are unintentional.

## 2017-01-08 ENCOUNTER — Ambulatory Visit (INDEPENDENT_AMBULATORY_CARE_PROVIDER_SITE_OTHER): Payer: Medicare Other | Admitting: Internal Medicine

## 2017-01-08 ENCOUNTER — Ambulatory Visit (INDEPENDENT_AMBULATORY_CARE_PROVIDER_SITE_OTHER): Payer: Medicare Other

## 2017-01-08 ENCOUNTER — Other Ambulatory Visit: Payer: Medicare Other

## 2017-01-08 ENCOUNTER — Other Ambulatory Visit
Admission: RE | Admit: 2017-01-08 | Discharge: 2017-01-08 | Disposition: A | Discharge status: Home / Self Care | Payer: Medicare Other | Source: Ambulatory Visit | Attending: Internal Medicine | Admitting: Internal Medicine

## 2017-01-08 ENCOUNTER — Encounter: Payer: Self-pay | Admitting: Internal Medicine

## 2017-01-08 DIAGNOSIS — R6889 Other general symptoms and signs: Secondary | ICD-10-CM

## 2017-01-08 DIAGNOSIS — Z1329 Encounter for screening for other suspected endocrine disorder: Secondary | ICD-10-CM | POA: Diagnosis not present

## 2017-01-08 DIAGNOSIS — M25532 Pain in left wrist: Secondary | ICD-10-CM | POA: Diagnosis not present

## 2017-01-08 DIAGNOSIS — I10 Essential (primary) hypertension: Secondary | ICD-10-CM | POA: Diagnosis not present

## 2017-01-08 DIAGNOSIS — E1159 Type 2 diabetes mellitus with other circulatory complications: Secondary | ICD-10-CM

## 2017-01-08 DIAGNOSIS — R319 Hematuria, unspecified: Secondary | ICD-10-CM | POA: Diagnosis not present

## 2017-01-08 DIAGNOSIS — M189 Osteoarthritis of first carpometacarpal joint, unspecified: Secondary | ICD-10-CM | POA: Diagnosis not present

## 2017-01-08 DIAGNOSIS — T148XXA Other injury of unspecified body region, initial encounter: Secondary | ICD-10-CM | POA: Diagnosis not present

## 2017-01-08 DIAGNOSIS — E559 Vitamin D deficiency, unspecified: Secondary | ICD-10-CM | POA: Diagnosis not present

## 2017-01-08 DIAGNOSIS — R011 Cardiac murmur, unspecified: Secondary | ICD-10-CM | POA: Insufficient documentation

## 2017-01-08 LAB — CBC WITH DIFFERENTIAL/PLATELET
Basophils Absolute: 0 10*3/uL (ref 0–0.1)
Basophils Relative: 1 %
Eosinophils Absolute: 0.2 10*3/uL (ref 0–0.7)
Eosinophils Relative: 3 %
HCT: 44 % (ref 40.0–52.0)
Hemoglobin: 14.9 g/dL (ref 13.0–18.0)
Lymphocytes Relative: 32 %
Lymphs Abs: 2.3 10*3/uL (ref 1.0–3.6)
MCH: 29.9 pg (ref 26.0–34.0)
MCHC: 33.9 g/dL (ref 32.0–36.0)
MCV: 88.2 fL (ref 80.0–100.0)
Monocytes Absolute: 0.6 10*3/uL (ref 0.2–1.0)
Monocytes Relative: 8 %
Neutro Abs: 4 10*3/uL (ref 1.4–6.5)
Neutrophils Relative %: 56 %
Platelets: 178 10*3/uL (ref 150–440)
RBC: 4.99 MIL/uL (ref 4.40–5.90)
RDW: 14.8 % — ABNORMAL HIGH (ref 11.5–14.5)
WBC: 7.1 10*3/uL (ref 3.8–10.6)

## 2017-01-08 LAB — COMPREHENSIVE METABOLIC PANEL
ALT: 27 U/L (ref 17–63)
AST: 32 U/L (ref 15–41)
Albumin: 4.3 g/dL (ref 3.5–5.0)
Alkaline Phosphatase: 43 U/L (ref 38–126)
Anion gap: 7 (ref 5–15)
BUN: 24 mg/dL — ABNORMAL HIGH (ref 6–20)
CO2: 26 mmol/L (ref 22–32)
Calcium: 9.6 mg/dL (ref 8.9–10.3)
Chloride: 103 mmol/L (ref 101–111)
Creatinine, Ser: 0.82 mg/dL (ref 0.61–1.24)
GFR calc Af Amer: >60 mL/min (ref >60)
GFR calc non Af Amer: >60 mL/min (ref >60)
Glucose, Bld: 147 mg/dL — ABNORMAL HIGH (ref 65–99)
Potassium: 3.8 mmol/L (ref 3.5–5.1)
Sodium: 136 mmol/L (ref 135–145)
Total Bilirubin: 1.1 mg/dL (ref 0.3–1.2)
Total Protein: 7.3 g/dL (ref 6.5–8.1)

## 2017-01-08 LAB — MICROALBUMIN / CREATININE URINE RATIO
Creatinine,U: 100.8 mg/dL
Microalb Creat Ratio: 2.2 mg/g (ref 0.0–30.0)
Microalb, Ur: 2.2 mg/dL — ABNORMAL HIGH (ref 0.0–1.9)

## 2017-01-08 LAB — URINALYSIS, ROUTINE W REFLEX MICROSCOPIC
Bilirubin Urine: NEGATIVE
Hgb urine dipstick: NEGATIVE
Ketones, ur: NEGATIVE
Leukocytes, UA: NEGATIVE
Nitrite: NEGATIVE
Specific Gravity, Urine: 1.015 (ref 1.000–1.030)
Total Protein, Urine: NEGATIVE
Urine Glucose: NEGATIVE
Urobilinogen, UA: 0.2 (ref 0.0–1.0)
pH: 6.5 (ref 5.0–8.0)

## 2017-01-08 LAB — SEDIMENTATION RATE: Sed Rate: 10 mm/hr (ref 0–20)

## 2017-01-08 LAB — URIC ACID: Uric Acid, Serum: 4.8 mg/dL (ref 4.4–7.6)

## 2017-01-08 LAB — T4, FREE: Free T4: 1.05 ng/dL (ref 0.61–1.12)

## 2017-01-08 LAB — C-REACTIVE PROTEIN: CRP: <0.8 mg/dL (ref <1.0)

## 2017-01-08 LAB — TSH: TSH: 1.006 u[IU]/mL (ref 0.350–4.500)

## 2017-01-08 MED ORDER — OXYCODONE HCL 5 MG PO TABS: 5.0000 mg | ORAL_TABLET | Freq: Two times a day (BID) | ORAL | 0 refills | Status: DC | PRN

## 2017-01-08 MED ORDER — NAPROXEN 500 MG PO TABS: 500.0000 mg | ORAL_TABLET | Freq: Two times a day (BID) | ORAL | 0 refills | Status: DC

## 2017-01-08 MED ORDER — PREDNISONE 20 MG PO TABS: 40.0000 mg | ORAL_TABLET | Freq: Every day | ORAL | 0 refills | Status: DC

## 2017-01-08 NOTE — Progress Notes (Signed)
Chief Complaint  Patient presents with  . Wrist Injury   Acute visit wife with him at [place] t [facility]. Left outer wrist pain denies trauma pain since yesterday worse with movement 9-10/10 and w/o movements 3-4/10. Tried Naproxen last night 500 mg x 1, on chronic tramadol and has not helped. He tried ACE wrap and this helped with controlling movement he thought about using a newspaper to brace his wrist. He denies trauma but does report carrying 20 lbs bags in his hand a some point. Wrist is swollen and rad and painful. He denies h/o gout, fever.  2. He reports shaking chills last night and wonders if he should have checked his blood sugar.  3. Of note he reports h/o kidney stones so always has blood in his urine  4. DM 2 last A1C 7.2 10/31/16 on Metformin 500 mg (1/2=250 mg) qd.    Review of Systems  Constitutional: Positive for chills.  HENT: Negative for hearing loss.   Eyes:       No vision problems   Respiratory: Negative for cough.   Cardiovascular: Negative for chest pain.  Gastrointestinal: Negative for abdominal pain.  Genitourinary: Positive for hematuria.  Musculoskeletal: Positive for joint pain.  Skin: Positive for rash.  Psychiatric/Behavioral: Negative for memory loss.   Past Medical History:  Diagnosis Date  . Arthritis   . CAD (coronary artery disease)   . Chicken pox   . Colon polyps    4 pre-cancerous   . Diverticulitis   . DM type 2 (diabetes mellitus, type 2) ([place])   . Dysrhythmia   . History of kidney stones   . Hyperlipidemia   . Hypertension   . Kidney stones   . Melanoma ([place]) 1980   Resected from his back  . Myocardial infarction [facility])    Past Surgical History:  Procedure Laterality Date  . CHOLECYSTECTOMY  2012  . COLONOSCOPY    . CORONARY ANGIOPLASTY WITH STENT PLACEMENT  1991 & 2005  . ESOPHAGOGASTRODUODENOSCOPY (EGD) WITH PROPOFOL N/A 11/19/2016   Procedure: ESOPHAGOGASTRODUODENOSCOPY (EGD) WITH PROPOFOL;  Surgeon: [name], MD;  Location: [facility]  ENDOSCOPY;  Service: Endoscopy;  Laterality: N/A;  . LITHOTRIPSY  2015  . MELANOMA EXCISION  1980   malignant  . NERVE SURGERY  2015   ulna nerve  . TONSILLECTOMY  1945  . WISDOM TOOTH EXTRACTION     Family History  Problem Relation Age of Onset  . Hyperlipidemia Mother   . Hypertension Mother   . Heart disease Mother   . Diabetes Mother   . Colon cancer Father   . Lung cancer Father   . Kidney cancer Father        malignant capsulated kidney tumor  . Diabetes Father   . Bladder Cancer Neg Hx   . Prostate cancer Neg Hx    Social History   Socioeconomic History  . Marital status: Married    Spouse name: Not on file  . Number of children: Not on file  . Years of education: Not on file  . Highest education level: Not on file  Social Needs  . Financial resource strain: Not on file  . Food insecurity - worry: Not on file  . Food insecurity - inability: Not on file  . Transportation needs - medical: Not on file  . Transportation needs - non-medical: Not on file  Occupational History  . Not on file  Tobacco Use  . Smoking status: Current Some Day Smoker    Types:  Pipe  . Smokeless tobacco: Former Systems developer    Quit date: 10/17/2015  Substance and Sexual Activity  . Alcohol use: Yes    Alcohol/week: 0.0 - 0.6 oz    Comment: none last 24hrs  . Drug use: No  . Sexual activity: Not on file  Other Topics Concern  . Not on file  Social History Narrative   Married    Current Meds  Medication Sig  . aspirin EC 81 MG tablet Take by mouth daily.   [name] atorvastatin (LIPITOR) 80 MG tablet TAKE 1 TABLET(80 MG) BY MOUTH DAILY  . Calcium Polycarbophil (FIBER-CAPS PO) Take by mouth daily.  . clonazePAM (KLONOPIN) 0.5 MG tablet TAKE 1 TABLET BY MOUTH TWICE DAILY AS NEEDED FOR ANXIETY  . fenofibrate 160 MG tablet TAKE 1 TABLET BY MOUTH EVERY DAY  . fluticasone (FLONASE) 50 MCG/ACT nasal spray Place 2 sprays into both nostrils daily.  [name] glucose blood test strip Use as instructed to check  blood sugar up to three times daily. E11.42  . hydrochlorothiazide (HYDRODIURIL) 25 MG tablet TAKE 1 TABLET BY MOUTH EVERY DAY  . lidocaine (LIDODERM) 5 % Place 1 patch onto the skin daily. Remove & Discard patch within 12 hours.  [name] lisinopril (PRINIVIL,ZESTRIL) 10 MG tablet TAKE 1 TABLET BY MOUTH DAILY  . metFORMIN (GLUCOPHAGE) 500 MG tablet Take 250 mg at bedtime by mouth.  . metoprolol succinate (TOPROL-XL) 25 MG 24 hr tablet TAKE 1/2 TABLET BY MOUTH TWICE DAILY  . Multiple Vitamin (MULTIVITAMIN) capsule Take 1 capsule by mouth daily.  . naproxen (NAPROSYN) 500 MG tablet Take 1 tablet (500 mg total) by mouth 2 (two) times daily with a meal.  . Omega-3 Fatty Acids (FISH OIL PO) Take by mouth daily.  . pantoprazole (PROTONIX) 40 MG tablet Take 1 tablet (40 mg total) 2 (two) times daily by mouth.  . tamsulosin (FLOMAX) 0.4 MG CAPS capsule TAKE 1 CAPSULE(0.4 MG) BY MOUTH DAILY  . traMADol (ULTRAM) 50 MG tablet TAKE 1 TABLET BY MOUTH EVERY 8 HOURS AS NEEDED  . [DISCONTINUED] naproxen (NAPROSYN) 500 MG tablet Take 500 mg by mouth 2 (two) times daily with a meal.   Allergies  Allergen Reactions  . Azithromycin    Recent Results (from the past 2160 hour(s))  Urinalysis, Complete     Status: Abnormal   Collection Time: 10/16/16 10:30 AM  Result Value Ref Range   Specific Gravity, UA 1.015 1.005 - 1.030   pH, UA 6.5 5.0 - 7.5   Color, UA Yellow Yellow   Appearance Ur Clear Clear   Leukocytes, UA Trace (A) Negative   Protein, UA Negative Negative/Trace   Glucose, UA Negative Negative   Ketones, UA Negative Negative   RBC, UA Negative Negative   Bilirubin, UA Negative Negative   Urobilinogen, Ur 0.2 0.2 - 1.0 mg/dL   Nitrite, UA Negative Negative   Microscopic Examination See below:   Microscopic Examination     Status: Abnormal   Collection Time: 10/16/16 10:30 AM  Result Value Ref Range   WBC, UA 0-5 0 - 5 /hpf   RBC, UA 3-10 (A) 0 - 2 /hpf   Epithelial Cells (non renal) None seen 0  - 10 /hpf   Casts Present (A) None seen /lpf   Cast Type Hyaline casts N/A   Mucus, UA Present (A) Not Estab.   Bacteria, UA Few (A) None seen/Few  PSA     Status: None   Collection Time: 10/31/16 10:01 AM  Result  Value Ref Range   PSA 0.54 0.10 - 4.00 ng/mL    Comment: Test performed using Access Hybritech PSA Assay, a parmagnetic partical, chemiluminecent immunoassay.  Hemoglobin A1c     Status: Abnormal   Collection Time: 10/31/16 10:01 AM  Result Value Ref Range   Hgb A1c MFr Bld 7.2 (H) 4.6 - 6.5 %    Comment: Glycemic Control Guidelines for People with Diabetes:Non Diabetic:  <6%Goal of Therapy: <7%Additional Action Suggested:  >8%   Glucose, capillary     Status: Abnormal   Collection Time: 11/19/16  8:57 AM  Result Value Ref Range   Glucose-Capillary 140 (H) 65 - 99 mg/dL  Surgical pathology     Status: None   Collection Time: 11/19/16  9:48 AM  Result Value Ref Range   SURGICAL PATHOLOGY      Surgical Pathology CASE: [id number] PATIENT: [name] [name] Surgical Pathology Report     SPECIMEN SUBMITTED: A. Stomach; cbx B. GEJ; cbx  CLINICAL HISTORY: None provided  PRE-OPERATIVE DIAGNOSIS: Gastric wall thickening, K31.89  POST-OPERATIVE DIAGNOSIS: Duodenitis, gastritis     DIAGNOSIS: A. STOMACH; COLD BIOPSY: - ANTRAL AND OXYNTIC MUCOSA WITH MILD CHRONIC NONSPECIFIC GASTRITIS. - NEGATIVE FOR H. PYLORI, DYSPLASIA, AND MALIGNANCY.  B. GEJ; COLD BIOPSY: - EDEMATOUS GASTRIC CARDIAC TYPE MUCOSA WITH REACTIVE FOVEOLAR HYPERPLASIA AND EVIDENCE OF HEALING EROSION. - SQUAMOUS MUCOSA IS NOT PRESENT FOR EVALUATION. - RARE GOBLET CELLS ARE PRESENT, SEE COMMENT. - NEGATIVE FOR DYSPLASIA AND MALIGNANCY.  Comment: The biopsy of the GEJ (specimen B) shows gastric type mucosa with scattered goblet cells. The diagnosis in this case depends on the location of this biopsy. If this biopsy was taken from the tubular esophagus at least 1 cm above the gastr ic fold it  shows Barrett's mucosa of the distinctive type. If this biopsy was taken from the gastric cardia it shows intestinal metaplasia of the gastric cardia. Per [employer] of Gastroenterology 2016 guidelines Barrett esophagus should be diagnosed when there is extension of salmon-colored mucosa into the tubular esophagus extending ?1 cm proximal to the gastroesophageal junction with biopsy confirmation of intestinal metaplasia.  Reference: Ulice Bold advance online publication, 16 November 2013     GROSS DESCRIPTION:  A. Labeled: C BX stomach  Tissue fragment(s): 1  Size: 0.5 cm  Description: pink-tan fragment  Entirely submitted in 1 cassette(s).   B. Labeled: C BX GEJ  Tissue fragment(s): 1  Size: 0.4 cm  Description: pink-tan fragment  Entirely submitted in 1 cassette(s).    Final Diagnosis performed by Quay Burow, MD.  Electronically signed 11/20/2016 12:36:14PM    The electronic signature indicates that the named A ttending Pathologist has evaluated the specimen  Technical component performed at [facility], [address] [place], [place] [address] Lab: [phone] Dir: Rush Farmer, MD, MMM  Professional component performed at [facility], [facility], [place], [place], [place] [address] Lab: [phone] Dir: Dellia Nims. Rubinas, MD     Objective  There is no height or weight on file to calculate BMI. Wt Readings from Last 3 Encounters:  12/26/16 204 lb (92.5 kg)  11/19/16 201 lb (91.2 kg)  11/05/16 205 lb (93 kg)   Temp Readings from Last 3 Encounters:  11/19/16 97.6 F (36.4 C) (Tympanic)  11/05/16 98 F (36.7 C) (Oral)  10/31/16 97.8 F (36.6 C) (Oral)   BP Readings from Last 3 Encounters:  12/26/16 131/79  11/19/16 123/71  11/05/16 (!) 111/57   Pulse Readings from Last 3 Encounters:  12/26/16 67  11/19/16 (!) 53  11/05/16 (!) 59   O2 sat room air 96%   Physical Exam  Constitutional: He is  oriented to person, place, and time and well-developed, well-nourished, and in no distress. Vital signs are normal.  HENT:  Head: Normocephalic and atraumatic.  Mouth/Throat: Oropharynx is clear and moist and mucous membranes are normal.  Eyes: Conjunctivae are normal. Pupils are equal, round, and reactive to light.  Cardiovascular: Normal rate and regular rhythm.  Murmur heard. Pulmonary/Chest: Effort normal and breath sounds normal.  Musculoskeletal: He exhibits tenderness.       Left wrist: He exhibits decreased range of motion, tenderness, bony tenderness and swelling.       Arms: OA changes to b/l hands   Neurological: He is alert and oriented to person, place, and time. Gait normal. Gait normal.  Skin: Skin is warm, dry and intact. No rash noted.  Psychiatric: Mood, memory, affect and judgment normal.  Nursing note and vitals reviewed.   Assessment   1. Left wrist pain ? Etiology arthritis vs infectious vs other  2. Rigors ? Etiology panculture, no flu sx's today  3. H/o hematuria and kidney stones noted CT ab/pelvis 09/24/16 reviewed  4. DM 2 A1c 7.2 10/31/16  5. HM  6. Cardiac murmur  Plan  1. Check labs CMET, CBC, UA/culture, blood cultures x2, RA labs and vit D with h/o fracture x 1 w/o trauma in the past, TSH, T4 today  Naproxen bid prn <2 weeks, oxycodone prn #10 no refills tramadol was not providing relief, L wrist splint, prednisone  If continues will refer to ortho hand  2. See above  3. UA today if has not been w/u in the past consider urology  4. On Metformin 250 mg qd  Need to check urine protein try to add on  Ask about eye exam and do foot exam in future on fibrate and statin and ACEI 5. Had flu shot  Pt to check on Tdap, pna vaccines, had zostavax disc shingrix today   Will need to check lipid in future  PSA nl 10/31/16 need to do DRE in future  No colonoscopy on file only EGD Dr. Allen [name] consider cologaurd in future and track down old colonoscopy if  had  Needs to have tbse if not had with h/o MM back   If note needs renal MRI 03/2017 to f/u MRI ab 10/04/16 kidney lesions defer to PCP to f/u    6. Last echo 03/26/13 will need to monitor and consider repeat echo and cards f/u in future if has not had      Provider: Dr. Olivia Mackie McLean-Scocuzza-Internal Medicine

## 2017-01-08 NOTE — Patient Instructions (Signed)
Try wrist split, Naproxen, Oxycodone for pain and prednisone  F/u in 1-2 weeks sooner if needed  Xray today  Get copy of vaccines at home  Wrist Pain, Adult There are many things that can cause wrist pain. Some common causes include:  An injury to the wrist area, such as a sprain, strain, or fracture.  Overuse of the joint.  A condition that causes increased pressure on a nerve in the wrist (carpal tunnel syndrome).  Wear and tear of the joints that occurs with aging (osteoarthritis).  A variety of other types of arthritis.  Sometimes, the cause of wrist pain is not known. Often, the pain goes away when you follow instructions from your health care provider for relieving pain at home, such as resting or icing the wrist. If your wrist pain continues, it is important to tell your health care provider. Follow these instructions at home:  Rest the wrist area for at least 48 hours or as long as told by your health care provider.  If a splint or elastic bandage has been applied, use it as told by your health care provider. ? Remove the splint or bandage only as told by your health care provider. ? Loosen the splint or bandage if your fingers tingle, become numb, or turn cold or blue.  If directed, apply ice to the injured area. ? If you have a removable splint or elastic bandage, remove it as told by your health care provider. ? Put ice in a plastic bag. ? Place a towel between your skin and the bag or between your splint or bandage and the bag. ? Leave the ice on for 20 minutes, 2-3 times a day.  Keep your arm raised (elevated) above the level of your heart while you are sitting or lying down.  Take over-the-counter and prescription medicines only as told by your health care provider.  Keep all follow-up visits as told by your health care provider. This is important. Contact a health care provider if:  You have a sudden sharp pain in the wrist, hand, or arm that is different or  new.  The swelling or bruising on your wrist or hand gets worse.  Your skin becomes red, gets a rash, or has open sores.  Your pain does not get better or it gets worse. Get help right away if:  You lose feeling in your fingers or hand.  Your fingers turn white, very red, or cold and blue.  You cannot move your fingers.  You have a fever or chills. This information is not intended to replace advice given to you by your health care provider. Make sure you discuss any questions you have with your health care provider. Document Released: 10/10/2004 Document Revised: 07/27/2015 Document Reviewed: 07/20/2015 Elsevier Interactive Patient Education  [employer].

## 2017-01-09 ENCOUNTER — Encounter: Payer: Self-pay | Admitting: Internal Medicine

## 2017-01-09 LAB — ANA W/REFLEX: Anti Nuclear Antibody(ANA): NEGATIVE

## 2017-01-09 LAB — URINE CULTURE
MICRO NUMBER:: 81448274
Result:: NO GROWTH
SPECIMEN QUALITY:: ADEQUATE

## 2017-01-09 LAB — CYCLIC CITRUL PEPTIDE ANTIBODY, IGG/IGA: CCP Antibodies IgG/IgA: 7 units (ref 0–19)

## 2017-01-09 LAB — RHEUMATOID FACTOR: Rhuematoid fact SerPl-aCnc: <10 IU/mL (ref 0.0–13.9)

## 2017-01-09 LAB — VITAMIN D 25 HYDROXY (VIT D DEFICIENCY, FRACTURES): Vit D, 25-Hydroxy: 33 ng/mL (ref 30.0–100.0)

## 2017-01-10 ENCOUNTER — Encounter: Payer: Self-pay | Admitting: *Deleted

## 2017-01-10 NOTE — Progress Notes (Signed)
12/28-sent mychart message

## 2017-01-13 LAB — CULTURE, BLOOD (ROUTINE X 2)
Culture: NO GROWTH
Special Requests: ADEQUATE

## 2017-01-22 ENCOUNTER — Encounter: Payer: Self-pay | Admitting: Internal Medicine

## 2017-01-22 ENCOUNTER — Ambulatory Visit (INDEPENDENT_AMBULATORY_CARE_PROVIDER_SITE_OTHER): Payer: Medicare Other | Admitting: Internal Medicine

## 2017-01-22 VITALS — BP 128/82 | HR 77 | Temp 98.2°F | Ht 72.0 in | Wt 205.5 lb

## 2017-01-22 DIAGNOSIS — R252 Cramp and spasm: Secondary | ICD-10-CM | POA: Diagnosis not present

## 2017-01-22 DIAGNOSIS — E1159 Type 2 diabetes mellitus with other circulatory complications: Secondary | ICD-10-CM

## 2017-01-22 DIAGNOSIS — R200 Anesthesia of skin: Secondary | ICD-10-CM | POA: Diagnosis not present

## 2017-01-22 DIAGNOSIS — E118 Type 2 diabetes mellitus with unspecified complications: Secondary | ICD-10-CM

## 2017-01-22 DIAGNOSIS — R011 Cardiac murmur, unspecified: Secondary | ICD-10-CM

## 2017-01-22 DIAGNOSIS — M25532 Pain in left wrist: Secondary | ICD-10-CM

## 2017-01-22 DIAGNOSIS — Z720 Tobacco use: Secondary | ICD-10-CM | POA: Diagnosis not present

## 2017-01-22 MED ORDER — METFORMIN HCL 500 MG PO TABS: ORAL_TABLET | ORAL | 0 refills | Status: DC

## 2017-01-22 MED ORDER — GLIPIZIDE ER 2.5 MG PO TB24: 2.5000 mg | ORAL_TABLET | Freq: Every day | ORAL | 0 refills | Status: DC

## 2017-01-22 NOTE — Patient Instructions (Signed)
Please start glipizide 2.5 in am  Continue metformin 250 in am and 500 mg at night but if too much go back to 250 mg in am  Fasting labs no food x 12 hours only water and medication for visit 05/01/17 schedule labs  F/u with clinic in 04/2017 or 05/2017 after labs  Try to figure out which pneumonia vaccine you've had  Consider CT chest  Consider neurology and shock test if hands worsening.  Take care   Glipizide Extended-release tablets What is this medicine? GLIPIZIDE (GLIP i zide) helps to treat type 2 diabetes. It is combined with diet and exercise. The medicine helps your body to use insulin better. This medicine may be used for other purposes; ask your health care provider or pharmacist if you have questions. COMMON BRAND NAME(S): Glucotrol XL What should I tell my health care provider before I take this medicine? They need to know if you have any of these conditions: -diabetic ketoacidosis -glucose-6-phosphate dehydrogenase deficiency -heart disease -kidney disease -liver disease -porphyria -severe infection or injury -thyroid disease -an unusual or allergic reaction to glipizide, sulfa drugs, other medicines, foods, dyes, or preservatives -pregnant or trying to get pregnant -breast-feeding How should I use this medicine? Take this medicine by mouth. Follow the directions on the prescription label. Swallow the tablets with a drink of water and take with your breakfast. Take your medicine at the same time each day. Do not take more often than directed. Talk to your pediatrician regarding the use of this medicine in children. Special care may be needed. Elderly patients over 72 years old may have a stronger reaction and need a smaller dose. Overdosage: If you think you have taken too much of this medicine contact a poison control center or emergency room at once. NOTE: This medicine is only for you. Do not share this medicine with others. What if I miss a dose? If you miss a dose,  take it as soon as you can. If it is almost time for your next dose, take only that dose. Do not take double or extra doses. What may interact with this medicine? -bosentan -chloramphenicol -cisapride -medicines for fungal or yeast infections -metoclopramide -probenecid -warfarin Many medications may cause an increase or decrease in blood sugar, these include: -alcohol containing beverages -aspirin and aspirin-like drugs -chloramphenicol -chromium -clarithromycin -male hormones, like estrogens or progestins and birth control pills -heart medicines -isoniazid -male hormones or anabolic steroids -medicines for weight loss -medicines for allergies, asthma, cold, or cough -medicines for mental problems -medicines called MAO Inhibitors like Nardil, Parnate, Marplan, Eldepryl -niacin -NSAIDs, medicines for pain and inflammation, like ibuprofen or naproxen -pentamidine -phenytoin -probenecid -quinolone antibiotics like ciprofloxacin, levofloxacin, ofloxacin -some herbal dietary supplements -steroid medicines like prednisone or cortisone -thyroid medicine -water pills or diuretics This list may not describe all possible interactions. Give your health care provider a list of all the medicines, herbs, non-prescription drugs, or dietary supplements you use. Also tell them if you smoke, drink alcohol, or use illegal drugs. Some items may interact with your medicine. What should I watch for while using this medicine? Visit your doctor or health care professional for regular checks on your progress. A test called the HbA1C (A1C) will be monitored. This is a simple blood test. It measures your blood sugar control over the last 2 to 3 months. You will receive this test every 3 to 6 months. Learn how to check your blood sugar. Learn the symptoms of low and high blood sugar  and how to manage them. Always carry a quick-source of sugar with you in case you have symptoms of low blood sugar.  Examples include hard sugar candy or glucose tablets. Make sure others know that you can choke if you eat or drink when you develop serious symptoms of low blood sugar, such as seizures or unconsciousness. They must get medical help at once. Tell your doctor or health care professional if you have high blood sugar. You might need to change the dose of your medicine. If you are sick or exercising more than usual, you might need to change the dose of your medicine. Do not skip meals. Ask your doctor or health care professional if you should avoid alcohol. Many nonprescription cough and cold products contain sugar or alcohol. These can affect blood sugar. This medicine can make you more sensitive to the sun. Keep out of the sun. If you cannot avoid being in the sun, wear protective clothing and use sunscreen. Do not use sun lamps or tanning beds/booths. Wear a medical ID bracelet or chain, and carry a card that describes your disease and details of your medicine and dosage times. What side effects may I notice from receiving this medicine? Side effects that you should report to your doctor or health care professional as soon as possible: -allergic reactions like skin rash, itching or hives, swelling of the face, lips, or tongue -breathing problems -dark urine -fever, chills, sore throat -signs and symptoms of low blood sugar such as feeling anxious, confusion, dizziness, increased hunger, unusually weak or tired, sweating, shakiness, cold, irritable, headache, blurred vision, fast heartbeat, loss of consciousness -unusual bleeding or bruising -yellowing of the eyes or skin Side effects that usually do not require medical attention (report to your doctor or health care professional if they continue or are bothersome): -diarrhea -dizziness -headache -heartburn -nausea -stomach gas This list may not describe all possible side effects. Call your doctor for medical advice about side effects. You may  report side effects to FDA at 1-800-FDA-1088. Where should I keep my medicine? Keep out of the reach of children. Store at room temperature between 15 to 30 degrees C (59 to 86 degrees F). Protect from moisture and humidity. Throw away any unused medicine after the expiration date. NOTE: This sheet is a summary. It may not cover all possible information. If you have questions about this medicine, talk to your doctor, pharmacist, or health care provider.  2018 Elsevier/Gold Standard (2012-04-15 14:32:16)

## 2017-01-24 ENCOUNTER — Encounter: Payer: Self-pay | Admitting: Internal Medicine

## 2017-01-24 NOTE — Progress Notes (Signed)
Chief Complaint  Patient presents with  . Follow-up   Follow up  1. DM 2 avg 14 days 151 and in am 140s max he has seen is 179. He increased Metformin from 250 mg qd to 250 am and 500 mg qpm though he notes diarrhea from it. He would be agreeable to add another medication but c/w cost of medication. At times from metformin stools are looser and more watery tolerating for now.   2. C/o cramps right foot 2 am x 1 he has had in legs before never foot.  He does state he is not drinking enough water.   3. Disc lab results  4. Left wrist pain better for now given copy of Xray with degenerative changes. He did use a splint he did not take steroids but did use narcotics and naproxen.  5. He is smoker of pipe sicne 1964 4-5 bowels per day reports he does not inhale. Does not smoke cigarettes. Also has done chew tobacco from 1960 to 1964 not currently. Disc CT chest screen lung cancer he wants to wait 6. He c/o decreased dexterity hands and numbness in b/l 4th and 5th fingers and also starting in 1st 3 fingers b/l. Denies neck pain. He has trouble with opening cans and fine movements. He reports h/o ulnar nerve surgery   Review of Systems  Constitutional: Negative for chills.  HENT: Negative for hearing loss.   Eyes:       No vision changes   Respiratory: Negative for shortness of breath.   Cardiovascular: Negative for chest pain.  Gastrointestinal: Negative for abdominal pain.  Musculoskeletal: Negative for joint pain.       +cramps  Skin: Negative for rash.  Neurological: Positive for sensory change.  Psychiatric/Behavioral: Negative for memory loss.   Past Medical History:  Diagnosis Date  . Arthritis   . CAD (coronary artery disease)   . Chicken pox   . Colon polyps    4 pre-cancerous   . Diverticulitis   . DM type 2 (diabetes mellitus, type 2) ([place])   . Dysrhythmia   . History of kidney stones   . Hyperlipidemia   . Hypertension   . Kidney stones   . Melanoma ([place]) 1980    Resected from his back  . Myocardial infarction [facility])    Past Surgical History:  Procedure Laterality Date  . CHOLECYSTECTOMY  2012  . COLONOSCOPY    . CORONARY ANGIOPLASTY WITH STENT PLACEMENT  1991 & 2005  . ESOPHAGOGASTRODUODENOSCOPY (EGD) WITH PROPOFOL N/A 11/19/2016   Procedure: ESOPHAGOGASTRODUODENOSCOPY (EGD) WITH PROPOFOL;  Surgeon: [name], MD;  Location: [facility] ENDOSCOPY;  Service: Endoscopy;  Laterality: N/A;  . LITHOTRIPSY  2015  . MELANOMA EXCISION  1980   malignant  . NERVE SURGERY  2015   ulna nerve  . TONSILLECTOMY  1945  . WISDOM TOOTH EXTRACTION     Family History  Problem Relation Age of Onset  . Hyperlipidemia Mother   . Hypertension Mother   . Heart disease Mother   . Diabetes Mother   . Colon cancer Father   . Lung cancer Father   . Kidney cancer Father        malignant capsulated kidney tumor  . Diabetes Father   . Bladder Cancer Neg Hx   . Prostate cancer Neg Hx    Social History   Socioeconomic History  . Marital status: Married    Spouse name: Not on file  . Number of children: Not on file  .  Years of education: Not on file  . Highest education level: Not on file  Social Needs  . Financial resource strain: Not on file  . Food insecurity - worry: Not on file  . Food insecurity - inability: Not on file  . Transportation needs - medical: Not on file  . Transportation needs - non-medical: Not on file  Occupational History  . Not on file  Tobacco Use  . Smoking status: Current Some Day Smoker    Types: Pipe  . Smokeless tobacco: Former Systems developer    Quit date: 10/17/2015  Substance and Sexual Activity  . Alcohol use: Yes    Alcohol/week: 0.0 - 0.6 oz    Comment: none last 24hrs  . Drug use: No  . Sexual activity: Not on file  Other Topics Concern  . Not on file  Social History Narrative   Married    Current Meds  Medication Sig  . psyllium (METAMUCIL) 58.6 % powder Take 1 packet by mouth daily.   Allergies  Allergen Reactions  .  Azithromycin    Recent Results (from the past 2160 hour(s))  PSA     Status: None   Collection Time: 10/31/16 10:01 AM  Result Value Ref Range   PSA 0.54 0.10 - 4.00 ng/mL    Comment: Test performed using Access Hybritech PSA Assay, a parmagnetic partical, chemiluminecent immunoassay.  Hemoglobin A1c     Status: Abnormal   Collection Time: 10/31/16 10:01 AM  Result Value Ref Range   Hgb A1c MFr Bld 7.2 (H) 4.6 - 6.5 %    Comment: Glycemic Control Guidelines for People with Diabetes:Non Diabetic:  <6%Goal of Therapy: <7%Additional Action Suggested:  >8%   Glucose, capillary     Status: Abnormal   Collection Time: 11/19/16  8:57 AM  Result Value Ref Range   Glucose-Capillary 140 (H) 65 - 99 mg/dL  Surgical pathology     Status: None   Collection Time: 11/19/16  9:48 AM  Result Value Ref Range   SURGICAL PATHOLOGY      Surgical Pathology CASE: [id number] PATIENT: Victor Espericueta Surgical Pathology Report     SPECIMEN SUBMITTED: A. Stomach; cbx B. GEJ; cbx  CLINICAL HISTORY: None provided  PRE-OPERATIVE DIAGNOSIS: Gastric wall thickening, K31.89  POST-OPERATIVE DIAGNOSIS: Duodenitis, gastritis     DIAGNOSIS: A. STOMACH; COLD BIOPSY: - ANTRAL AND OXYNTIC MUCOSA WITH MILD CHRONIC NONSPECIFIC GASTRITIS. - NEGATIVE FOR H. PYLORI, DYSPLASIA, AND MALIGNANCY.  B. GEJ; COLD BIOPSY: - EDEMATOUS GASTRIC CARDIAC TYPE MUCOSA WITH REACTIVE FOVEOLAR HYPERPLASIA AND EVIDENCE OF HEALING EROSION. - SQUAMOUS MUCOSA IS NOT PRESENT FOR EVALUATION. - RARE GOBLET CELLS ARE PRESENT, SEE COMMENT. - NEGATIVE FOR DYSPLASIA AND MALIGNANCY.  Comment: The biopsy of the GEJ (specimen B) shows gastric type mucosa with scattered goblet cells. The diagnosis in this case depends on the location of this biopsy. If this biopsy was taken from the tubular esophagus at least 1 cm above the gastr ic fold it shows Barrett's mucosa of the distinctive type. If this biopsy was taken from the  gastric cardia it shows intestinal metaplasia of the gastric cardia. Per The [employer] of Gastroenterology 2016 guidelines Barrett esophagus should be diagnosed when there is extension of salmon-colored mucosa into the tubular esophagus extending ?1 cm proximal to the gastroesophageal junction with biopsy confirmation of intestinal metaplasia.  Reference: Ulice Bold advance online publication, 16 November 2013     GROSS DESCRIPTION:  A. Labeled: C BX stomach  Tissue fragment(s): 1  Size:  0.5 cm  Description: pink-tan fragment  Entirely submitted in 1 cassette(s).   B. Labeled: C BX GEJ  Tissue fragment(s): 1  Size: 0.4 cm  Description: pink-tan fragment  Entirely submitted in 1 cassette(s).    Final Diagnosis performed by Quay Burow, MD.  Electronically signed 11/20/2016 12:36:14PM    The electronic signature indicates that the named A ttending Pathologist has evaluated the specimen  Technical component performed at [facility], [address] [place], [place] [address] Lab: [phone] Dir: Rush Farmer, MD, MMM  Professional component performed at [facility], [address], [place], [place], [place] [address] Lab: [phone] Dir: Dellia Nims. Rubinas, MD    Urinalysis, Routine w reflex microscopic     Status: Abnormal   Collection Time: 01/08/17 10:05 AM  Result Value Ref Range   Color, Urine YELLOW Yellow;Lt. Yellow   APPearance CLEAR Clear   Specific Gravity, Urine 1.015 1.000 - 1.030   pH 6.5 5.0 - 8.0   Total Protein, Urine NEGATIVE Negative   Urine Glucose NEGATIVE Negative   Ketones, ur NEGATIVE Negative   Bilirubin Urine NEGATIVE Negative   Hgb urine dipstick NEGATIVE Negative   Urobilinogen, UA 0.2 0.0 - 1.0   Leukocytes, UA NEGATIVE Negative   Nitrite NEGATIVE Negative   WBC, UA 3-6/hpf (A) 0-2/hpf   RBC / HPF 0-2/hpf 0-2/hpf   Mucus, UA Presence of (A) None   Squamous Epithelial / LPF Rare(0-4/hpf)  Rare(0-4/hpf)  Urine Culture     Status: None   Collection Time: 01/08/17 10:05 AM  Result Value Ref Range   MICRO NUMBER: [id number]    SPECIMEN QUALITY: ADEQUATE    Sample Source NOT GIVEN    STATUS: FINAL    Result: No Growth   Culture, blood (Routine X 2) w Reflex to ID Panel     Status: None   Collection Time: 01/08/17 11:15 AM  Result Value Ref Range   Specimen Description BLOOD LEFT ANTECUBITAL    Special Requests      BOTTLES DRAWN AEROBIC AND ANAEROBIC Blood Culture adequate volume   Culture      NO GROWTH 5 DAYS Performed at [facility], [address] [place], [place] [address]    Report Status 73/53/2992 FINAL   CYCLIC CITRUL PEPTIDE ANTIBODY, IGG/IGA     Status: None   Collection Time: 01/08/17 11:15 AM  Result Value Ref Range   CCP Antibodies IgG/IgA 7 0 - 19 units    Comment: (NOTE)                          Negative               <20                          Weak positive      20 - 39                          Moderate positive  40 - 59                          Strong positive        >59 Performed At: [facility] [place], [place] [id number] Rush Farmer MD QI:[id number] Performed at [facility], [address] [place], [place] [address]   ANA w/Reflex  Status: None   Collection Time: 01/08/17 11:15 AM  Result Value Ref Range   Anit Nuclear Antibody(ANA) Negative Negative    Comment: (NOTE) Performed At: [facility] [place], [place] [id number] Rush Farmer MD BS:[id number] Performed at [facility], [address] [place], [place] [address]   Rheumatoid Factor     Status: None   Collection Time: 01/08/17 11:15 AM  Result Value Ref Range   Rhuematoid fact SerPl-aCnc <10.0 0.0 - 13.9 IU/mL    Comment: (NOTE) Performed At: [facility] [place], [place] [id number] Rush Farmer MD TJ:[id number] Performed at [facility], [address] [place], [place] [address]   Uric acid     Status: None   Collection Time: 01/08/17 11:15 AM  Result Value Ref Range   Uric Acid, Serum 4.8 4.4 - 7.6 mg/dL    Comment: Performed at [facility], [address] [place], [place] [address]  TSH     Status: None   Collection Time: 01/08/17 11:15 AM  Result Value Ref Range   TSH 1.006 0.350 - 4.500 uIU/mL    Comment: Performed by a 3rd Generation assay with a functional sensitivity of <=0.01 uIU/mL. Performed at [facility], [address] [place], [place] [address]   T4, free     Status: None   Collection Time: 01/08/17 11:15 AM  Result Value Ref Range   Free T4 1.05 0.61 - 1.12 ng/dL    Comment: (NOTE) Biotin ingestion may interfere with free T4 tests. If the results are inconsistent with the TSH level, previous test results, or the clinical presentation, then consider biotin interference. If needed, order repeat testing after stopping biotin. Performed at [facility], [address] [place], [place] [address]   C-reactive protein     Status: None   Collection Time: 01/08/17 11:15 AM  Result Value Ref Range   CRP <0.8 <1.0 mg/dL    Comment: Performed at [facility] [address], [place], [place] [address]  Comp Met (CMET)     Status: Abnormal   Collection Time: 01/08/17 11:15 AM  Result Value Ref Range   Sodium 136 135 - 145 mmol/L   Potassium 3.8 3.5 - 5.1 mmol/L   Chloride 103 101 - 111 mmol/L   CO2 26 22 - 32 mmol/L   Glucose, Bld 147 (H) 65 - 99 mg/dL   BUN 24 (H) 6 - 20 mg/dL   Creatinine, Ser 0.82 0.61 - 1.24 mg/dL   Calcium 9.6 8.9 - 10.3 mg/dL   Total Protein 7.3 6.5 - 8.1 g/dL   Albumin 4.3 3.5 - 5.0 g/dL   AST 32 15 - 41 U/L   ALT 27 17 - 63 U/L   Alkaline Phosphatase 43 38 - 126 U/L   Total Bilirubin 1.1 0.3 - 1.2 mg/dL   GFR calc non Af Amer >60 >60 mL/min   GFR calc Af Amer >60 >60 mL/min    Comment: (NOTE) The eGFR has been calculated using the CKD EPI  equation. This calculation has not been validated in all clinical situations. eGFR's persistently <60 mL/min signify possible Chronic Kidney Disease.    Anion gap 7 5 - 15    Comment: Performed at [facility], [address] [place], [place] [address]  Sedimentation rate     Status: None   Collection Time: 01/08/17 11:15 AM  Result Value Ref Range   Sed Rate 10 0 -  20 mm/hr    Comment: Performed at [facility], [address] [place], [place] [address]  CBC w/Diff     Status: Abnormal   Collection Time: 01/08/17 11:15 AM  Result Value Ref Range   WBC 7.1 3.8 - 10.6 K/uL   RBC 4.99 4.40 - 5.90 MIL/uL   Hemoglobin 14.9 13.0 - 18.0 g/dL   HCT 44.0 40.0 - 52.0 %   MCV 88.2 80.0 - 100.0 fL   MCH 29.9 26.0 - 34.0 pg   MCHC 33.9 32.0 - 36.0 g/dL   RDW 14.8 (H) 11.5 - 14.5 %   Platelets 178 150 - 440 K/uL   Neutrophils Relative % 56 %   Neutro Abs 4.0 1.4 - 6.5 K/uL   Lymphocytes Relative 32 %   Lymphs Abs 2.3 1.0 - 3.6 K/uL   Monocytes Relative 8 %   Monocytes Absolute 0.6 0.2 - 1.0 K/uL   Eosinophils Relative 3 %   Eosinophils Absolute 0.2 0 - 0.7 K/uL   Basophils Relative 1 %   Basophils Absolute 0.0 0 - 0.1 K/uL    Comment: Performed at [facility], [address] [place], [place] [address]  Vitamin D (25 hydroxy)     Status: None   Collection Time: 01/08/17 11:15 AM  Result Value Ref Range   Vit D, 25-Hydroxy 33.0 30.0 - 100.0 ng/mL    Comment: (NOTE) Vitamin D deficiency has been defined by the Institute of Medicine and an Endocrine Society practice guideline as a level of serum 25-OH vitamin D less than 20 ng/mL (1,2). The Endocrine Society went on to further define vitamin D insufficiency as a level between 21 and 29 ng/mL (2). 1. IOM (Institute of Medicine). 2010. Dietary reference   intakes for calcium and D. [place]: The   [employer]. 2. Holick MF, Binkley [place], Bischoff-Ferrari HA, et al.   Evaluation,  treatment, and prevention of vitamin D   deficiency: an Endocrine Society clinical practice   guideline. JCEM. 2011 Jul; 96(7):1911-30. Performed At: [facility] [place], [place] [id number] Rush Farmer MD CV:[id number] Performed at [facility], [address] [place], [place] [address]   Urine Microalbumin w/creat. ratio     Status: Abnormal   Collection Time: 01/08/17  2:56 PM  Result Value Ref Range   Microalb, Ur 2.2 (H) 0.0 - 1.9 mg/dL   Creatinine,U 100.8 mg/dL   Microalb Creat Ratio 2.2 0.0 - 30.0 mg/g   Objective  Body mass index is 27.87 kg/m. Wt Readings from Last 3 Encounters:  01/22/17 205 lb 8 oz (93.2 kg)  12/26/16 204 lb (92.5 kg)  11/19/16 201 lb (91.2 kg)   Temp Readings from Last 3 Encounters:  01/22/17 98.2 F (36.8 C) (Oral)  11/19/16 97.6 F (36.4 C) (Tympanic)  11/05/16 98 F (36.7 C) (Oral)   BP Readings from Last 3 Encounters:  01/22/17 128/82  12/26/16 131/79  11/19/16 123/71   Pulse Readings from Last 3 Encounters:  01/22/17 77  12/26/16 67  11/19/16 (!) 53   O2 sat room air 96% Physical Exam  Constitutional: He is oriented to person, place, and time and well-developed, well-nourished, and in no distress. Vital signs are normal.  HENT:  Head: Normocephalic and atraumatic.  Mouth/Throat: Oropharynx is clear and moist and mucous membranes are normal.  Eyes: Conjunctivae are normal. Pupils are equal, round, and reactive to light.  Cardiovascular: Normal rate and regular rhythm.  Murmur heard. Pulmonary/Chest: Effort  normal and breath sounds normal.  Musculoskeletal:  Neg left wrist pain  Neurological: He is alert and oriented to person, place, and time. Gait normal. Gait normal.  Skin: Skin is warm, dry and intact.  Psychiatric: Mood, memory, affect and judgment normal.  Nursing note and vitals reviewed.   Assessment   1. Left wrist pain resolved  2. DM 2 with am and postprandial  hyperglycemia 3. Cramps legs, right foot 4. Pipe smoker since 1964 4-5 bowels per day 5. ? Ulnar nerve vs carpal tunnel sx's b/l with trouble opening cans and dexterity  6. HM  Plan  1.  Reviewed Xray with OA changes  Monitor  2.  Metformin 250 in am and 500 mg qhs if can tolerate otherwise back to 250 qam  Add Glipizide 2.5 in am with food pt worried about cost with other orals Repeat A1C, lipid and BMET 04/2017 and f/u 04/2017 oro 05/2017  Ask about eye exam at f/u and do foot exam  Urine protein had 01/08/17 elevated microAlb 2.2 nl 1.9    3.  Electrolytes recently checked and normal but elevated BUN encouraged pt to increase water intake avoid NSAIDS 4.  Disc CT scan of chest rec for further with smoking history he reports he smokes pipe does not inhale I advised this should not matter and rec cessation  5.  If continues refer to neurology for NCS/EMG to w/u ulnar neuropathy vs CTS  6.  Had flu shot ? If had pna 23 vs prevnar pt to find records had in [facility] or GI per pt Will need Tdap and shingrix if has not had   Pt also to get copy of last colonoscopy  Consider CT chest low dose screen for lung cancer  PSA nl 10/31/16   Of note cardiac murmur consider w/u in future if not had   Provider: Dr. Olivia Mackie McLean-Scocuzza-Internal Medicine

## 2017-01-26 DIAGNOSIS — Z720 Tobacco use: Secondary | ICD-10-CM | POA: Insufficient documentation

## 2017-02-03 ENCOUNTER — Telehealth: Payer: Self-pay | Admitting: Family

## 2017-02-03 NOTE — Telephone Encounter (Signed)
Can he try to back down to 250 mg in am of Metformin like he was doing  Continue glipizide  Increase water intake with diarrhea  Thanks [place]

## 2017-02-03 NOTE — Telephone Encounter (Signed)
Please advise 

## 2017-02-03 NOTE — Telephone Encounter (Signed)
Pt says that he is currently taking metformin which is causing the side effect of diarrhea. Pt says today alone he has already use the restroom 3 times. Pt would like to be advise on if he should discontinue medication?    Pt called in to schedule an apt with provider, showing the first available is on 20/17/19, if possible pt would like to see provider sooner?     Please assist pt further.    CB: [phone]

## 2017-02-04 NOTE — Telephone Encounter (Signed)
Ok to stop metformin lets continue same glipizide dose for now call back with blood glucose readings in 2 weeks   Thanks [place]

## 2017-02-04 NOTE — Telephone Encounter (Signed)
Patient has been informed of direction.  He stated he will comply and he had no questions, comments, or concerns.

## 2017-02-04 NOTE — Telephone Encounter (Signed)
Patient has back down to 250mg  for the morning and at night. He is still having SX.  Patient is wondering if he can come off the metformin and up dosage for Glipizide.  Please advise.

## 2017-02-10 ENCOUNTER — Other Ambulatory Visit: Payer: Self-pay | Admitting: Family Medicine

## 2017-02-27 ENCOUNTER — Other Ambulatory Visit: Payer: Self-pay | Admitting: Family Medicine

## 2017-02-27 DIAGNOSIS — G4700 Insomnia, unspecified: Secondary | ICD-10-CM

## 2017-02-28 ENCOUNTER — Telehealth: Payer: Self-pay | Admitting: Family

## 2017-02-28 NOTE — Telephone Encounter (Signed)
Call pt  rec'ed records from [facility], dr garrison regarding renal stone.   Do the results need to go to Dr Pilar Jarvis, urology, as well?   I wanted to ensure everyone on same page.

## 2017-02-28 NOTE — Telephone Encounter (Signed)
Script faxed . Left voice mail to call back ok for [facility] to speak to patient

## 2017-02-28 NOTE — Telephone Encounter (Signed)
Okay to refill? 

## 2017-02-28 NOTE — Telephone Encounter (Signed)
I looked up patient on [place] Controlled Substances Reporting System and saw no activity that raised concern of inappropriate use.   Refilled.   Please ensure pt has f/u appt with me in the next few months.

## 2017-03-02 ENCOUNTER — Other Ambulatory Visit: Payer: Self-pay | Admitting: Internal Medicine

## 2017-03-02 NOTE — Progress Notes (Signed)
Reviewed old records Dr. Marliss Coots:  DM 2 on Metformin and Amaryl at one time  H/o kidney stones  No further details in notes other than remote labs  [place]

## 2017-03-03 ENCOUNTER — Other Ambulatory Visit: Payer: Self-pay | Admitting: Family Medicine

## 2017-03-03 ENCOUNTER — Other Ambulatory Visit: Payer: Self-pay | Admitting: Family

## 2017-03-03 DIAGNOSIS — D044 Carcinoma in situ of skin of scalp and neck: Secondary | ICD-10-CM | POA: Diagnosis not present

## 2017-03-03 DIAGNOSIS — X32XXXA Exposure to sunlight, initial encounter: Secondary | ICD-10-CM | POA: Diagnosis not present

## 2017-03-03 DIAGNOSIS — D485 Neoplasm of uncertain behavior of skin: Secondary | ICD-10-CM | POA: Diagnosis not present

## 2017-03-03 DIAGNOSIS — L570 Actinic keratosis: Secondary | ICD-10-CM | POA: Diagnosis not present

## 2017-03-03 NOTE — Telephone Encounter (Signed)
Spoke with patient regarding below he states that Dr Loni Muse already has Dr Barnetta Hammersmith records. Thanks

## 2017-03-06 NOTE — Telephone Encounter (Signed)
Patient has follow up appointment on 03/12/17

## 2017-03-12 ENCOUNTER — Encounter: Payer: Self-pay | Admitting: Family

## 2017-03-12 ENCOUNTER — Ambulatory Visit (INDEPENDENT_AMBULATORY_CARE_PROVIDER_SITE_OTHER): Payer: Medicare Other | Admitting: Family

## 2017-03-12 DIAGNOSIS — N2889 Other specified disorders of kidney and ureter: Secondary | ICD-10-CM

## 2017-03-12 DIAGNOSIS — M545 Low back pain: Secondary | ICD-10-CM

## 2017-03-12 DIAGNOSIS — G8929 Other chronic pain: Secondary | ICD-10-CM

## 2017-03-12 DIAGNOSIS — E118 Type 2 diabetes mellitus with unspecified complications: Secondary | ICD-10-CM | POA: Diagnosis not present

## 2017-03-12 DIAGNOSIS — R011 Cardiac murmur, unspecified: Secondary | ICD-10-CM

## 2017-03-12 MED ORDER — GLIPIZIDE ER 5 MG PO TB24: 5.0000 mg | ORAL_TABLET | Freq: Every day | ORAL | 1 refills | Status: DC

## 2017-03-12 MED ORDER — TRAMADOL HCL 50 MG PO TABS: 50.0000 mg | ORAL_TABLET | Freq: Three times a day (TID) | ORAL | 0 refills | Status: DC | PRN

## 2017-03-12 NOTE — Patient Instructions (Addendum)
Labs when fasting- please make an appointment  Dr Donivan Scull office will call you about an appointment as want to ensure you are fine to start exercise program. If you dont hear from them, please call their office.   Please bring records for pneumonia vaccine

## 2017-03-12 NOTE — Assessment & Plan Note (Signed)
Unchanged. Will continue prn tramadol.

## 2017-03-12 NOTE — Progress Notes (Signed)
Subjective:    Patient ID: [name] [name], male    DOB: 1940/04/01, 77 y.o.   MRN: [id number]  CC: [name] [name] is a 77 y.o. male who presents today for follow up.   HPI: Doing well today. No complaints.   DM- FBG, 130's. Today 114. Abdominal pain improved after stopping metformin. 'thinks primary cause'. Taking glipizide BID. ( needs refill at 5mg  dose).   H/o  Murmur - last echo at [facility] shows mild to moderate AS,  2015. Follows with Rockey Situ   Denies exertional chest pain or pressure, numbness or tingling radiating to left arm or jaw, palpitations, dizziness, frequent headaches, changes in vision, or shortness of breath.   Plans to start silver sneaker program. Hasnt been exercising prior.   Continues to smoke.   Needs refill for tramadol ( for LBP) and, flomax     Interim:   Seen by [facility] 01/22/2017  12/2016 Dr [name]  Urology - Dr Pilar Jarvis. Patient is aware of  repeat for [place] for right renal mass.   Notes passing a kidney stone 02/20/17.   Recent squamous cell cancer removed from scalp with dermatology.   HISTORY:  Past Medical History:  Diagnosis Date  . Arthritis   . CAD (coronary artery disease)   . Chicken pox   . Colon polyps    4 pre-cancerous   . Diverticulitis   . DM type 2 (diabetes mellitus, type 2) ([place])   . Dysrhythmia   . History of kidney stones   . Hyperlipidemia   . Hypertension   . Kidney stones   . Melanoma ([place]) 1980   Resected from his back  . Myocardial infarction [facility])    Past Surgical History:  Procedure Laterality Date  . CHOLECYSTECTOMY  2012  . COLONOSCOPY    . CORONARY ANGIOPLASTY WITH STENT PLACEMENT  1991 & 2005  . ESOPHAGOGASTRODUODENOSCOPY (EGD) WITH PROPOFOL N/A 11/19/2016   Procedure: ESOPHAGOGASTRODUODENOSCOPY (EGD) WITH PROPOFOL;  Surgeon: [name], MD;  Location: [facility] ENDOSCOPY;  Service: Endoscopy;  Laterality: N/A;  . LITHOTRIPSY  2015  . MELANOMA EXCISION  1980   malignant  . NERVE SURGERY  2015     ulna nerve  . TONSILLECTOMY  1945  . WISDOM TOOTH EXTRACTION     Family History  Problem Relation Age of Onset  . Hyperlipidemia Mother   . Hypertension Mother   . Heart disease Mother   . Diabetes Mother   . Colon cancer Father   . Lung cancer Father   . Kidney cancer Father        malignant capsulated kidney tumor  . Diabetes Father   . Bladder Cancer Neg Hx   . Prostate cancer Neg Hx     Allergies: Azithromycin Current Outpatient Medications on File Prior to Visit  Medication Sig Dispense Refill  . aspirin EC 81 MG tablet Take by mouth daily.     [name] atorvastatin (LIPITOR) 80 MG tablet TAKE 1 TABLET(80 MG) BY MOUTH DAILY 90 tablet 1  . clonazePAM (KLONOPIN) 0.5 MG tablet TAKE 1 TABLET BY MOUTH TWICE DAILY AS NEEDED FOR ANXIETY 60 tablet 0  . fenofibrate 160 MG tablet TAKE 1 TABLET BY MOUTH EVERY DAY 90 tablet 1  . fluticasone (FLONASE) 50 MCG/ACT nasal spray Place 2 sprays into both nostrils daily. 16 g 6  . glucose blood test strip Use as instructed to check blood sugar up to three times daily. E11.42 100 each 12  . hydrochlorothiazide (HYDRODIURIL) 25 MG tablet TAKE 1  TABLET BY MOUTH EVERY DAY 90 tablet 0  . lidocaine (LIDODERM) 5 % Place 1 patch onto the skin daily. Remove & Discard patch within 12 hours. 30 patch 2  . lisinopril (PRINIVIL,ZESTRIL) 10 MG tablet TAKE 1 TABLET BY MOUTH DAILY 90 tablet 3  . metoprolol succinate (TOPROL-XL) 25 MG 24 hr tablet TAKE 1/2 TABLET BY MOUTH TWICE DAILY 90 tablet 0  . Multiple Vitamin (MULTIVITAMIN) capsule Take 1 capsule by mouth daily.    . naproxen (NAPROSYN) 500 MG tablet Take 1 tablet (500 mg total) by mouth 2 (two) times daily with a meal. (Patient taking differently: Take 500 mg by mouth 2 (two) times daily with a meal. ) 180 tablet 0  . Omega-3 Fatty Acids (FISH OIL PO) Take by mouth daily.    . pantoprazole (PROTONIX) 40 MG tablet Take 1 tablet (40 mg total) 2 (two) times daily by mouth. 30 tablet 11  . psyllium (METAMUCIL)  58.6 % powder Take 1 packet by mouth daily.    . tamsulosin (FLOMAX) 0.4 MG CAPS capsule TAKE 1 CAPSULE(0.4 MG) BY MOUTH DAILY 90 capsule 0   No current facility-administered medications on file prior to visit.     Social History   Tobacco Use  . Smoking status: Current Some Day Smoker    Types: Pipe  . Smokeless tobacco: Former Systems developer    Quit date: 10/17/2015  Substance Use Topics  . Alcohol use: Yes    Alcohol/week: 0.0 - 0.6 oz    Comment: none last 24hrs  . Drug use: No    Review of Systems  Constitutional: Negative for chills and fever.  Respiratory: Negative for cough.   Cardiovascular: Negative for chest pain and palpitations.  Gastrointestinal: Negative for nausea and vomiting.  Musculoskeletal: Positive for back pain (chronic).      Objective:    BP 118/84 (BP Location: Left Arm, Patient Position: Sitting, Cuff Size: Normal)   Pulse 63   Temp 97.8 F (36.6 C) (Oral)   Resp 16   Wt 201 lb 4 oz (91.3 kg)   SpO2 96%   BMI 27.29 kg/m  BP Readings from Last 3 Encounters:  03/12/17 118/84  01/22/17 128/82  12/26/16 131/79   Wt Readings from Last 3 Encounters:  03/12/17 201 lb 4 oz (91.3 kg)  01/22/17 205 lb 8 oz (93.2 kg)  12/26/16 204 lb (92.5 kg)    Physical Exam  Constitutional: He appears well-developed and well-nourished.  Cardiovascular: Regular rhythm.  Murmur heard.  Systolic murmur is present with a grade of 1/6. SEM I/VI, Loudest LSB, non radiating, no thrill   Pulmonary/Chest: Effort normal and breath sounds normal. No respiratory distress. He has no wheezes. He has no rhonchi. He has no rales.  Neurological: He is alert.  Skin: Skin is warm and dry.  Psychiatric: He has a normal mood and affect. His speech is normal and behavior is normal.  Vitals reviewed.      Assessment & Plan:   Problem List Items Addressed This Visit      Endocrine   Controlled type 2 diabetes mellitus with complication, without long-term current use of insulin  ([place])    Very pleased that diarrhea/abdominal pain resolved when off of metformin. FBG are very close to goal. Advised patient that he may take glipizide 5mg  qam and would await a1c to make changes from there      Relevant Medications   glipiZIDE (GLUCOTROL XL) 5 MG 24 hr tablet     Other  Chronic low back pain    Unchanged. Will continue prn tramadol.       Relevant Medications   traMADol (ULTRAM) 50 MG tablet   Right renal mass    Continues to follow with [facility]. Understands to have repeat US this year.       Cardiac murmur    Soft grade I murmur appreciated on exam. Patient is adamant that he is not having any symptoms which is reassuring. In the context of starting a regular exercise program, I contacted Pamella [facility] Control and instrumentation engineer) to see if they could reach out to patient for follow up appointment. His last echo was 2015. This may suffice or cardiology may repeat. Patient understands this and will let me know if doesn't hear from [facility] office.           I have discontinued [name] Smoak's oxyCODONE, predniSONE, and metFORMIN. I have also changed his glipiZIDE and traMADol. Additionally, I am having him maintain his aspirin EC, fluticasone, Omega-3 Fatty Acids (FISH OIL PO), multivitamin, lisinopril, glucose blood, lidocaine, fenofibrate, pantoprazole, tamsulosin, naproxen, psyllium, hydrochlorothiazide, clonazePAM, metoprolol succinate, and atorvastatin.   Meds ordered this encounter  Medications  . glipiZIDE (GLUCOTROL XL) 5 MG 24 hr tablet    Sig: Take 1 tablet (5 mg total) by mouth daily with breakfast.    Dispense:  90 tablet    Refill:  1    Generic ok    Order Specific Question:   Supervising Provider    Answer:   Deborra Medina L [[id number]]  . traMADol (ULTRAM) 50 MG tablet    Sig: Take 1 tablet (50 mg total) by mouth every 8 (eight) hours as needed.    Dispense:  90 tablet    Refill:  0    Order Specific Question:   Supervising Provider    Answer:    Crecencio Mc [[id number]]    Return precautions given.   Risks, benefits, and alternatives of the medications and treatment plan prescribed today were discussed, and patient expressed understanding.   Education regarding symptom management and diagnosis given to patient on AVS.  Continue to follow with [name], [name] for routine health maintenance.   [name] and I agreed with plan.   [name], [name]

## 2017-03-12 NOTE — Assessment & Plan Note (Signed)
Soft grade I murmur appreciated on exam. Patient is adamant that he is not having any symptoms which is reassuring. In the context of starting a regular exercise program, I contacted Pamella Allen Control and instrumentation engineer) to see if they could reach out to patient for follow up appointment. His last echo was 2015. This may suffice or cardiology may repeat. Patient understands this and will let me know if doesn't hear from [facility] office.

## 2017-03-12 NOTE — Assessment & Plan Note (Signed)
Continues to follow with Budz[facility]. Understands to have repeat US this year.

## 2017-03-12 NOTE — Assessment & Plan Note (Signed)
Very pleased that diarrhea/abdominal pain resolved when off of metformin. FBG are very close to goal. Advised patient that he may take glipizide 5mg  qam and would await a1c to make changes from there

## 2017-03-13 ENCOUNTER — Other Ambulatory Visit: Payer: Self-pay | Admitting: Family

## 2017-03-13 ENCOUNTER — Other Ambulatory Visit (INDEPENDENT_AMBULATORY_CARE_PROVIDER_SITE_OTHER): Payer: Medicare Other

## 2017-03-13 DIAGNOSIS — E118 Type 2 diabetes mellitus with unspecified complications: Secondary | ICD-10-CM | POA: Diagnosis not present

## 2017-03-13 LAB — BASIC METABOLIC PANEL
BUN: 23 mg/dL (ref 6–23)
CO2: 26 mEq/L (ref 19–32)
Calcium: 10.1 mg/dL (ref 8.4–10.5)
Chloride: 102 mEq/L (ref 96–112)
Creatinine, Ser: 0.88 mg/dL (ref 0.40–1.50)
GFR: 89.22 mL/min (ref >60.00)
Glucose, Bld: 143 mg/dL — ABNORMAL HIGH (ref 70–99)
Potassium: 3.7 mEq/L (ref 3.5–5.1)
Sodium: 137 mEq/L (ref 135–145)

## 2017-03-13 LAB — LIPID PANEL
Cholesterol: 131 mg/dL (ref 0–200)
HDL: 36.1 mg/dL — ABNORMAL LOW (ref >39.00)
LDL Cholesterol: 61 mg/dL (ref 0–99)
NonHDL: 94.96
Total CHOL/HDL Ratio: 4
Triglycerides: 172 mg/dL — ABNORMAL HIGH (ref 0.0–149.0)
VLDL: 34.4 mg/dL (ref 0.0–40.0)

## 2017-03-13 LAB — HEMOGLOBIN A1C: Hgb A1c MFr Bld: 6.9 % — ABNORMAL HIGH (ref 4.6–6.5)

## 2017-03-14 ENCOUNTER — Ambulatory Visit (INDEPENDENT_AMBULATORY_CARE_PROVIDER_SITE_OTHER): Payer: Medicare Other | Admitting: Physician Assistant

## 2017-03-14 ENCOUNTER — Encounter: Payer: Self-pay | Admitting: Physician Assistant

## 2017-03-14 VITALS — BP 108/66 | HR 52 | Ht 73.0 in | Wt 201.0 lb

## 2017-03-14 DIAGNOSIS — I2510 Atherosclerotic heart disease of native coronary artery without angina pectoris: Secondary | ICD-10-CM | POA: Diagnosis not present

## 2017-03-14 DIAGNOSIS — I519 Heart disease, unspecified: Secondary | ICD-10-CM

## 2017-03-14 DIAGNOSIS — I340 Nonrheumatic mitral (valve) insufficiency: Secondary | ICD-10-CM | POA: Diagnosis not present

## 2017-03-14 DIAGNOSIS — R001 Bradycardia, unspecified: Secondary | ICD-10-CM | POA: Diagnosis not present

## 2017-03-14 DIAGNOSIS — I351 Nonrheumatic aortic (valve) insufficiency: Secondary | ICD-10-CM

## 2017-03-14 DIAGNOSIS — E782 Mixed hyperlipidemia: Secondary | ICD-10-CM | POA: Diagnosis not present

## 2017-03-14 DIAGNOSIS — I10 Essential (primary) hypertension: Secondary | ICD-10-CM

## 2017-03-14 NOTE — Progress Notes (Signed)
Cardiology Office Note Date:  03/14/2017  Patient ID:  [name] [name], DOB 08-28-1940, MRN [id number] PCP:  [name], [name]  Cardiologist:  Dr. Rockey Situ, MD    Chief Complaint: Follow up  History of Present Illness: [name] [name] is a 77 y.o. male with history of CAD s/p MI with remote PCI in 7681 and 1572, systolic dysfunction, aortic insufficiency, mitral regurgitation, DM2, HTN, HLD, fatty liver, melanoma, aortic atherosclerosis, ongoing tobacco abuse, chronic back pain, BPH, kidney stone, and right renal mass being followed with periodic noninvasive evaluation at this time who presents for follow up of cardiac murmur.   Most recent ischemic evaluation via Myoview on 03/26/2013 that showed an old inferior MI, no ischemia, LVEF 50%, slight inferior wall hypokniesis. Most recent echo from 03/26/2013 showed an EF of 50-55%, mild global hypokinesis, mild to moderate aortic sclerosis, mild aortic insufficiency, mild to moderate mitral regurgitation, mild tricuspid regurgitation, moderately dilated left atrium, mildly dilated right ventricle, PASP 31 mmHg. He was most recently seen by Dr. Rockey Situ in 09/2016 for routine follow up and was doing well at that time. He was seen by PCP on 03/12/17 and noted to have a murmur on exam. He was advised to follow up with cardiology.   Labs: 02/2017: A1c 6.9, LDL 61, TG 172, K+ 3.7, SCr 0.88, normal LFT in 12/2016.  He comes in feeling well today.  No chest pain, shortness of breath, palpitations, dizziness, nausea, vomiting, diaphoresis, presyncope, or syncope.  He plans to start a walking program and lift some light weights in the near future.  He is considering the Silver sneakers program.  He reports he has not been as active since moving to the [place] area from [place].  He does purposefully walk up and down the stairs in his house multiple times per day or to to get some mild exercise.  He is asymptomatic when doing this.  Blood pressure at  home typically run in the low 620B-5 teens systolic with a heart rate typically in the 60s BPM.  He remains compliant with medications.  He does not have any concerns at this time.  Of note, the patient self discontinued metformin as this was causing significant GI upset.  Since stopping this medication is GI upset has resolved.   Past Medical History:  Diagnosis Date  . Aortic insufficiency    a. noted on TTE 2015  . Arthritis   . CAD (coronary artery disease)    a. remote PCI in 1991 and 2005; b. MV 3/15: old inferior MI, no ischemia, LVEF 50%, slight inferior wall hypokniesis  . Chicken pox   . Colon polyps    4 pre-cancerous   . Diverticulitis   . DM type 2 (diabetes mellitus, type 2) ([place])   . Hyperlipidemia   . Hypertension   . Kidney stones   . Melanoma ([place]) 1980   Resected from his back  . Mitral regurgitation    a. noted on TTE 2015  . Myocardial infarction ([place])   . Systolic dysfunction    a. TTE 2015: EF  50-55%, mild global hypokinesis, mild to moderate aortic sclerosis, mild aortic insufficiency, mild to moderate mitral regurgitation, mild tricuspid regurgitation, moderately dilated left atrium, mildly dilated right ventricle, PASP 31 mmHg    Past Surgical History:  Procedure Laterality Date  . CHOLECYSTECTOMY  2012  . COLONOSCOPY    . CORONARY ANGIOPLASTY WITH STENT PLACEMENT  1991 & 2005  . ESOPHAGOGASTRODUODENOSCOPY (EGD) WITH PROPOFOL N/A 11/19/2016  Procedure: ESOPHAGOGASTRODUODENOSCOPY (EGD) WITH PROPOFOL;  Surgeon: [name], MD;  Location: [facility] ENDOSCOPY;  Service: Endoscopy;  Laterality: N/A;  . LITHOTRIPSY  2015  . MELANOMA EXCISION  1980   malignant  . NERVE SURGERY  2015   ulna nerve  . TONSILLECTOMY  1945  . WISDOM TOOTH EXTRACTION      No outpatient medications have been marked as taking for the 03/14/17 encounter (Office Visit) with Rise Mu, PA-C.    Allergies:   Azithromycin   Social History:  The patient  reports that he has been  smoking pipe.  He quit smokeless tobacco use about 16 months ago. He reports that he drinks alcohol. He reports that he does not use drugs.   Family History:  The patient's family history includes Colon cancer in his father; Diabetes in his father and mother; Heart disease in his mother; Hyperlipidemia in his mother; Hypertension in his mother; Kidney cancer in his father; Lung cancer in his father.  ROS:   Review of Systems  Constitutional: Negative for chills, diaphoresis, fever, malaise/fatigue and weight loss.  HENT: Negative for congestion.   Eyes: Negative for discharge and redness.  Respiratory: Negative for cough, hemoptysis, sputum production, shortness of breath and wheezing.   Cardiovascular: Negative for chest pain, palpitations, orthopnea, claudication, leg swelling and PND.  Gastrointestinal: Positive for abdominal pain and diarrhea. Negative for blood in stool, heartburn, melena, nausea and vomiting.       Much improved  Genitourinary: Negative for hematuria.  Musculoskeletal: Negative for falls and myalgias.  Skin: Negative for rash.  Neurological: Negative for dizziness, tingling, tremors, sensory change, speech change, focal weakness, loss of consciousness and weakness.  Endo/Heme/Allergies: Does not bruise/bleed easily.  Psychiatric/Behavioral: Negative for substance abuse. The patient is not nervous/anxious.   All other systems reviewed and are negative.    PHYSICAL EXAM:  VS:  BP 108/66 (BP Location: Left Arm, Patient Position: Sitting, Cuff Size: Normal)   Pulse (!) 52   Ht 6\' 1"  (1.854 m)   Wt 201 lb (91.2 kg)   BMI 26.52 kg/m  BMI: Body mass index is 26.52 kg/m.  Physical Exam  Constitutional: He is oriented to person, place, and time. He appears well-developed and well-nourished.  HENT:  Head: Normocephalic and atraumatic.  Eyes: Right eye exhibits no discharge. Left eye exhibits no discharge.  Neck: Normal range of motion. No JVD present.    Cardiovascular: Normal rate, regular rhythm, S1 normal and S2 normal. Exam reveals no distant heart sounds, no friction rub, no midsystolic click and no opening snap.  Murmur heard. High-pitched blowing holosystolic murmur is present with a grade of 1/6 at the apex. High-pitched blowing decrescendo early diastolic murmur is present with a grade of 1/6 at the upper right sternal border radiating to the apex. Pulses:      Posterior tibial pulses are 2+ on the right side, and 2+ on the left side.  Pulmonary/Chest: Effort normal and breath sounds normal. No respiratory distress. He has no decreased breath sounds. He has no wheezes. He has no rales. He exhibits no tenderness.  Abdominal: Soft. He exhibits no distension. There is no tenderness.  Musculoskeletal: He exhibits no edema.  Neurological: He is alert and oriented to person, place, and time.  Skin: Skin is warm and dry. No cyanosis. Nails show no clubbing.  Psychiatric: He has a normal mood and affect. His speech is normal and behavior is normal. Judgment and thought content normal.     EKG:  Was ordered and interpreted by me today. Shows sinus bradycardia, 50 bpm, left axis deviation, inferior lateral T wave inversion (noted on previous studies)  Recent Labs: 01/08/2017: ALT 27; Hemoglobin 14.9; Platelets 178; TSH 1.006 03/13/2017: BUN 23; Creatinine, Ser 0.88; Potassium 3.7; Sodium 137  05/01/2016: Direct LDL 49.0 03/13/2017: Cholesterol 131; HDL 36.10; LDL Cholesterol 61; Total CHOL/HDL Ratio 4; Triglycerides 172.0; VLDL 34.4   Estimated Creatinine Clearance: 79.4 mL/min (by C-G formula based on SCr of 0.88 mg/dL).   Wt Readings from Last 3 Encounters:  03/14/17 201 lb (91.2 kg)  03/12/17 201 lb 4 oz (91.3 kg)  01/22/17 205 lb 8 oz (93.2 kg)     Other studies reviewed: Additional studies/records reviewed today include: summarized above  ASSESSMENT AND PLAN:  1. CAD of native coronary artery without angina: No symptoms  concerning for chest pain at this time.  Continue aspirin 81 mg daily.  Continue Lipitor and Toprol as detailed below.  Aggressive risk factor modification including smoking cessation is advised.  Secondary prevention.  No plans for further ischemic evaluation at this time.  2. Systolic dysfunction: He does not appear grossly volume overloaded at check echocardiogram as detailed below.  Continue Toprol as detailed below.  He remains on HCTZ.  CHF education provided.  3. Mild aortic insufficiency/mild to moderate mitral regurgitation: Asymptomatic.  Has known mild to moderate valvular heart disease dating back to at least 03/2013 as detailed above.  On exam, I suspect his valvular heart disease is stable.  We will check an echocardiogram to trend his aortic insufficiency and mitral regurgitation.  If this is found to be stable he would be okay to start a silver sneakers program as tolerated.  4. Sinus bradycardia: Heart rate seems to typically run in the 60s BPM.  He reports he just recently took his Toprol prior to coming to the office today.  He will check his heart rate and blood pressure over the next several days and give me a call on 3/4 with these readings.  If he remains significantly bradycardic at that time we may decrease his Toprol-XL to 12.5 mg once daily.  Recent TSH normal.  Recent potassium 3.7.  5. Hypertension: Blood pressure is well controlled and on the softer side today.  He will call with his BP/HR readings over the next several days as detailed above.  For now, continue Toprol-XL 12.5 mg twice daily.  6. Hyperlipidemia: Most recent LDL of 61 as detailed above.  Continue Lipitor and fenofibrate per PCP.  7. PAD: Noted on CT scan 09/2016.  No symptoms of claudication.  Continue Lipitor and fenofibrate as above.  Follow-up with primary cardiologist and consider referral to Dr. Fletcher Anon if indicated.  Disposition: F/u with Dr. Rockey Situ following echocardiogram results/12 months.    Current  medicines are reviewed at length with the patient today.  The patient did not have any concerns regarding medicines.  Signed, Christell Faith, PA-C 03/14/2017 11:41 AM     [address], [place] [address] [phone]

## 2017-03-14 NOTE — Patient Instructions (Addendum)
Medication Instructions:  Your physician recommends that you continue on your current medications as directed. Please refer to the Current Medication list given to you today.   Labwork: none  Testing/Procedures: Your physician has requested that you have an echocardiogram. Echocardiography is a painless test that uses sound waves to create images of your heart. It provides your doctor with information about the size and shape of your heart and how well your heart's chambers and valves are working. This procedure takes approximately one hour. There are no restrictions for this procedure.    Follow-Up: Your physician recommends that you keep follow up appointment with Dr. Rockey Situ pending echo results.     Any Other Special Instructions Will Be Listed Below (If Applicable). Please call Monday with blood pressure and heart rate readings.     If you need a refill on your cardiac medications before your next appointment, please call your pharmacy.  Echocardiogram An echocardiogram, or echocardiography, uses sound waves (ultrasound) to produce an image of your heart. The echocardiogram is simple, painless, obtained within a short period of time, and offers valuable information to your health care provider. The images from an echocardiogram can provide information such as:  Evidence of coronary artery disease (CAD).  Heart size.  Heart muscle function.  Heart valve function.  Aneurysm detection.  Evidence of a past heart attack.  Fluid buildup around the heart.  Heart muscle thickening.  Assess heart valve function.  Tell a health care provider about:  Any allergies you have.  All medicines you are taking, including vitamins, herbs, eye drops, creams, and over-the-counter medicines.  Any problems you or family members have had with anesthetic medicines.  Any blood disorders you have.  Any surgeries you have had.  Any medical conditions you have.  Whether you are  pregnant or may be pregnant. What happens before the procedure? No special preparation is needed. Eat and drink normally. What happens during the procedure?  In order to produce an image of your heart, gel will be applied to your chest and a wand-like tool (transducer) will be moved over your chest. The gel will help transmit the sound waves from the transducer. The sound waves will harmlessly bounce off your heart to allow the heart images to be captured in real-time motion. These images will then be recorded.  You may need an IV to receive a medicine that improves the quality of the pictures. What happens after the procedure? You may return to your normal schedule including diet, activities, and medicines, unless your health care provider tells you otherwise. This information is not intended to replace advice given to you by your health care provider. Make sure you discuss any questions you have with your health care provider. Document Released: 12/29/1999 Document Revised: 08/19/2015 Document Reviewed: 09/07/2012 Elsevier Interactive Patient Education  2017 [employer].

## 2017-03-17 ENCOUNTER — Telehealth: Payer: Self-pay | Admitting: Cardiovascular Disease

## 2017-03-17 MED ORDER — CARVEDILOL 3.125 MG PO TABS: 3.1250 mg | ORAL_TABLET | Freq: Two times a day (BID) | ORAL | 3 refills | Status: DC

## 2017-03-17 NOTE — Telephone Encounter (Signed)
Patient verbalized understanding to stop metoprolol and start carvedilol 3.125mg  by mouth two times a day starting tonight. He was very Patent attorney.  Rx sent to pharmacy.

## 2017-03-17 NOTE — Telephone Encounter (Signed)
BP overall is fairly well controlled. Heart rate is somewhat bradycardic at times, even prior to medication. Please change Toprol XL to Coreg 3.125 mg bid. This will have greater effect on his BP and less effect on his heart rate. He can start Coreg in place of his evening dose of Toprol tonight.

## 2017-03-17 NOTE — Telephone Encounter (Signed)
S/w patient. He was to call us today per Ryan's advice with BP/HR readings. Saturday: 1145 am 104/57  52 After morning metoprolol. 4:50 pm 117/58  70 Before evening metoprolol.  Sunday: 0830 am 141/77  52 Before morning metoprolol. 5:07 pm 133/74  79 Before evening metoprolol. 11pm  137/81  55  Advised I will route to Christell Faith, PA-C for review.

## 2017-03-17 NOTE — Telephone Encounter (Signed)
Readings from over the weekend to regulate metoprolol  Average systolic was 742 bystolic was 72 and pulse was 56  Over Saturday / Sunday  104/57 pulse 52 117/58 pulse 70 141/77 pulse 52 133/71 pulse 49 137/81 pulse 55 138/86 pulse 60  138/86 pulse 60 - with no medication this morning  Please call to discuss

## 2017-03-25 ENCOUNTER — Other Ambulatory Visit: Payer: Self-pay

## 2017-03-25 ENCOUNTER — Ambulatory Visit (INDEPENDENT_AMBULATORY_CARE_PROVIDER_SITE_OTHER): Payer: Medicare Other

## 2017-03-25 DIAGNOSIS — I340 Nonrheumatic mitral (valve) insufficiency: Secondary | ICD-10-CM

## 2017-04-07 ENCOUNTER — Ambulatory Visit
Admission: RE | Admit: 2017-04-07 | Discharge: 2017-04-07 | Disposition: A | Discharge status: Home / Self Care | Payer: Medicare Other | Source: Ambulatory Visit | Attending: Urology | Admitting: Urology

## 2017-04-07 DIAGNOSIS — N200 Calculus of kidney: Secondary | ICD-10-CM | POA: Insufficient documentation

## 2017-04-07 DIAGNOSIS — N281 Cyst of kidney, acquired: Secondary | ICD-10-CM

## 2017-04-10 ENCOUNTER — Encounter: Payer: Self-pay | Admitting: Urology

## 2017-04-10 ENCOUNTER — Ambulatory Visit: Payer: Medicare Other

## 2017-04-10 ENCOUNTER — Ambulatory Visit (INDEPENDENT_AMBULATORY_CARE_PROVIDER_SITE_OTHER): Payer: Medicare Other | Admitting: Urology

## 2017-04-10 VITALS — BP 123/74 | HR 70 | Ht 73.0 in | Wt 203.0 lb

## 2017-04-10 DIAGNOSIS — N200 Calculus of kidney: Secondary | ICD-10-CM | POA: Diagnosis not present

## 2017-04-10 DIAGNOSIS — I2510 Atherosclerotic heart disease of native coronary artery without angina pectoris: Secondary | ICD-10-CM

## 2017-04-10 DIAGNOSIS — N281 Cyst of kidney, acquired: Secondary | ICD-10-CM | POA: Diagnosis not present

## 2017-04-10 NOTE — Progress Notes (Signed)
04/10/2017 9:39 AM   [name] [name] July 19, 1940 [id number]  Referring provider: [name], [name] [address] [place], [place] [address]  Chief Complaint  Patient presents with  . Follow-up    HPI: The patient is a 77 year old gentleman with a past medical history of nephrolithiasis requiring lithotripsy presents today for follow up. He did have a CT abdomen and pelvis with contrast only in September 2018. There were multiple urological findings. He had multiple large calculi bilaterally. The largest on the right is in the lower pole and is approximately 1.4cm. On the left the largest was approximately 1.8cm. There were also multiple 5-8 mm stones throughout each kidney bilaterally. Hounsfield units were up to 1500. There is also 1.2 cmright lower pole lesion that is not completely characterized. He also has bilateral up to 7 cm benign simple renal cysts. He therefore underwent an MRI renal protocol for the above renal lesion. He did have a 0.9 cm Bosniak 20F cyst. All other cysts were benign.  He has a significant history of nephrolithiasis. His stones have been calcium oxalate in the past. He has passed multiple stones in the past. He has required surgery 3 times. He has had lithotripsy once and ureteroscopy twice. He has not passed a stone in over a year. He does have a CT report from 2008 that notes his current larger stones were probably half the current size at that time.  He also has microscopic hematuria. He has not seen recent gross hematuria. He has had gross hematuria and passing stones before. He has had none recently. His hematuria work up in October 2018 was negative except for the stones as mentioned above.   He underwent a renal ultrasound which showed relative stability of his stones.  The renal cyst seen on the MRI was 1.1 cm however this is a different imaging modality.   Since his last visit he did pass a small kidney stone.  It was not painful.  He  has no other complaints at this time.   PMH: Past Medical History:  Diagnosis Date  . Aortic insufficiency    a. noted on TTE 2015  . Arthritis   . CAD (coronary artery disease)    a. remote PCI in 1991 and 2005; b. MV 3/15: old inferior MI, no ischemia, LVEF 50%, slight inferior wall hypokniesis  . Chicken pox   . Colon polyps    4 pre-cancerous   . Diverticulitis   . DM type 2 (diabetes mellitus, type 2) ([place])   . Hyperlipidemia   . Hypertension   . Kidney stones   . Melanoma ([place]) 1980   Resected from his back  . Mitral regurgitation    a. noted on TTE 2015  . Myocardial infarction ([place])   . Systolic dysfunction    a. TTE 2015: EF  50-55%, mild global hypokinesis, mild to moderate aortic sclerosis, mild aortic insufficiency, mild to moderate mitral regurgitation, mild tricuspid regurgitation, moderately dilated left atrium, mildly dilated right ventricle, PASP 31 mmHg    Surgical History: Past Surgical History:  Procedure Laterality Date  . CHOLECYSTECTOMY  2012  . COLONOSCOPY    . CORONARY ANGIOPLASTY WITH STENT PLACEMENT  1991 & 2005  . ESOPHAGOGASTRODUODENOSCOPY (EGD) WITH PROPOFOL N/A 11/19/2016   Procedure: ESOPHAGOGASTRODUODENOSCOPY (EGD) WITH PROPOFOL;  Surgeon: [name], MD;  Location: [facility] ENDOSCOPY;  Service: Endoscopy;  Laterality: N/A;  . LITHOTRIPSY  2015  . MELANOMA EXCISION  1980   malignant  . NERVE SURGERY  2015   ulna nerve  . TONSILLECTOMY  1945  . WISDOM TOOTH EXTRACTION      Home Medications:  Allergies as of 04/10/2017      Reactions   Azithromycin       Medication List        Accurate as of 04/10/17  9:39 AM. Always use your most recent med list.          aspirin EC 81 MG tablet Take by mouth daily.   atorvastatin 80 MG tablet Commonly known as:  LIPITOR TAKE 1 TABLET(80 MG) BY MOUTH DAILY   carvedilol 3.125 MG tablet Commonly known as:  COREG Take 1 tablet (3.125 mg total) by mouth 2 (two) times daily.   clonazePAM 0.5  MG tablet Commonly known as:  KLONOPIN TAKE 1 TABLET BY MOUTH TWICE DAILY AS NEEDED FOR ANXIETY   fenofibrate 160 MG tablet TAKE 1 TABLET BY MOUTH EVERY DAY   FISH OIL PO Take by mouth daily.   fluticasone 50 MCG/ACT nasal spray Commonly known as:  FLONASE Place 2 sprays into both nostrils daily.   glipiZIDE 5 MG 24 hr tablet Commonly known as:  GLUCOTROL XL Take 1 tablet (5 mg total) by mouth daily with breakfast.   glucose blood test strip Use as instructed to check blood sugar up to three times daily. E11.42   hydrochlorothiazide 25 MG tablet Commonly known as:  HYDRODIURIL TAKE 1 TABLET BY MOUTH EVERY DAY   lisinopril 10 MG tablet Commonly known as:  PRINIVIL,ZESTRIL TAKE 1 TABLET BY MOUTH DAILY   multivitamin capsule Take 1 capsule by mouth daily.   pantoprazole 40 MG tablet Commonly known as:  PROTONIX Take 1 tablet (40 mg total) 2 (two) times daily by mouth.   psyllium 58.6 % powder Commonly known as:  METAMUCIL Take 1 packet by mouth daily.   tamsulosin 0.4 MG Caps capsule Commonly known as:  FLOMAX TAKE 1 CAPSULE(0.4 MG) BY MOUTH DAILY   traMADol 50 MG tablet Commonly known as:  ULTRAM Take 1 tablet (50 mg total) by mouth every 8 (eight) hours as needed.       Allergies:  Allergies  Allergen Reactions  . Azithromycin     Family History: Family History  Problem Relation Age of Onset  . Hyperlipidemia Mother   . Hypertension Mother   . Heart disease Mother   . Diabetes Mother   . Colon cancer Father   . Lung cancer Father   . Kidney cancer Father        malignant capsulated kidney tumor  . Diabetes Father   . Bladder Cancer Neg Hx   . Prostate cancer Neg Hx     Social History:  reports that he has been smoking pipe.  He quit smokeless tobacco use about 17 months ago. He reports that he drinks alcohol. He reports that he does not use drugs.  ROS: UROLOGY Frequent Urination?: No Hard to postpone urination?: No Burning/pain with  urination?: No Get up at night to urinate?: Yes Leakage of urine?: No Urine stream starts and stops?: No Trouble starting stream?: No Do you have to strain to urinate?: No Blood in urine?: No Urinary tract infection?: No Sexually transmitted disease?: No Injury to kidneys or bladder?: No Painful intercourse?: No Weak stream?: No Erection problems?: No Penile pain?: No  Gastrointestinal Nausea?: No Vomiting?: No Indigestion/heartburn?: No Diarrhea?: No Constipation?: No  Constitutional Fever: No Night sweats?: No Weight loss?: No Fatigue?: No  Skin Skin rash/lesions?: No Itching?:  No  Eyes Blurred vision?: No Double vision?: No  Ears/Nose/Throat Sore throat?: No Sinus problems?: No  Hematologic/Lymphatic Swollen glands?: No Easy bruising?: No  Cardiovascular Leg swelling?: No Chest pain?: No  Respiratory Cough?: No Shortness of breath?: No  Endocrine Excessive thirst?: No  Musculoskeletal Back pain?: No Joint pain?: No  Neurological Headaches?: No Dizziness?: No  Psychologic Depression?: No Anxiety?: No  Physical Exam: BP 123/74 (BP Location: Right Arm, Patient Position: Sitting, Cuff Size: Normal)   Pulse 70   Ht 6\' 1"  (1.854 m)   Wt 203 lb (92.1 kg)   BMI 26.78 kg/m   Constitutional:  Alert and oriented, No acute distress. HEENT: [place] AT, moist mucus membranes.  Trachea midline, no masses. Cardiovascular: No clubbing, cyanosis, or edema. Respiratory: Normal respiratory effort, no increased work of breathing. GI: Abdomen is soft, nontender, nondistended, no abdominal masses GU: No CVA tenderness.  Skin: No rashes, bruises or suspicious lesions. Lymph: No cervical or inguinal adenopathy. Neurologic: Grossly intact, no focal deficits, moving all 4 extremities. Psychiatric: Normal mood and affect.  Laboratory Data: Lab Results  Component Value Date   WBC 7.1 01/08/2017   HGB 14.9 01/08/2017   HCT 44.0 01/08/2017   MCV 88.2  01/08/2017   PLT 178 01/08/2017    Lab Results  Component Value Date   CREATININE 0.88 03/13/2017    Lab Results  Component Value Date   PSA 0.54 10/31/2016    No results found for: TESTOSTERONE  Lab Results  Component Value Date   HGBA1C 6.9 (H) 03/13/2017    Urinalysis    Component Value Date/Time   COLORURINE YELLOW 01/08/2017 1005   APPEARANCEUR CLEAR 01/08/2017 1005   APPEARANCEUR Clear 10/16/2016 1030   LABSPEC 1.015 01/08/2017 1005   PHURINE 6.5 01/08/2017 1005   GLUCOSEU NEGATIVE 01/08/2017 1005   HGBUR NEGATIVE 01/08/2017 1005   BILIRUBINUR NEGATIVE 01/08/2017 1005   BILIRUBINUR Negative 10/16/2016 1030   KETONESUR NEGATIVE 01/08/2017 1005   PROTEINUR Negative 10/16/2016 1030   UROBILINOGEN 0.2 01/08/2017 1005   NITRITE NEGATIVE 01/08/2017 1005   LEUKOCYTESUR NEGATIVE 01/08/2017 1005   LEUKOCYTESUR Trace (A) 10/16/2016 1030    Pertinent Imaging: Renal ultrasound reviewed as above  Assessment & Plan:    1. Bosniak 67F right renal mass -I discussed with the patient that there was a slight difference in size between his most recent renal ultrasound and his previous MRI.  We did discuss this could be due to differences in imaging modalities.  We will plan for a MR Irenal mass protocol in 3 months   2. Bilateral nephrolithiasis Remains uninterested in treating stones as they are stable in size.  Will obtain KUB prior to next visit.   Return for MRI, KUB prior.  Nickie Retort, MD  [facility] [address] [place],  [address] [phone]

## 2017-04-12 ENCOUNTER — Other Ambulatory Visit: Payer: Self-pay | Admitting: Family

## 2017-04-12 ENCOUNTER — Other Ambulatory Visit: Payer: Self-pay | Admitting: Family Medicine

## 2017-04-17 ENCOUNTER — Ambulatory Visit: Payer: Medicare Other

## 2017-04-20 ENCOUNTER — Encounter: Payer: Self-pay | Admitting: Internal Medicine

## 2017-04-20 NOTE — Progress Notes (Signed)
Reviewed Colonoscopies  Last 04/12/15 colon polyps, diverticulosis, int hemorrhoids Dr. Jearld Pies Brunetta Jeans [place] -pathology adenomatous polpys x 4 2 in ascending and 2 in transverse colon   Colonoscopy 09/04/11 +colon polyps, diverticulosis, int. hermorhoids -pathology tubular adenoma x 4   Colonoscopy 09/01/08 colon polyp x 1, pan diverticulosis L>R, int. Hemorrhoids -cecal polyp adenomatous   Colonoscopy 08/27/05 colon polyps x 2, pan diverticulosis surgical anastomosis  -pathology rectosigmoid adenomatous polyp, rectal polyp mixed adenomatous and hyperplastic polyp   TMS

## 2017-04-22 ENCOUNTER — Ambulatory Visit: Payer: Medicare Other | Admitting: Internal Medicine

## 2017-04-23 DIAGNOSIS — C4442 Squamous cell carcinoma of skin of scalp and neck: Secondary | ICD-10-CM | POA: Diagnosis not present

## 2017-04-23 DIAGNOSIS — L905 Scar conditions and fibrosis of skin: Secondary | ICD-10-CM | POA: Diagnosis not present

## 2017-04-26 ENCOUNTER — Other Ambulatory Visit: Payer: Self-pay | Admitting: Family

## 2017-04-26 ENCOUNTER — Other Ambulatory Visit: Payer: Self-pay | Admitting: Internal Medicine

## 2017-04-26 DIAGNOSIS — G8929 Other chronic pain: Secondary | ICD-10-CM

## 2017-04-26 DIAGNOSIS — M545 Low back pain: Principal | ICD-10-CM

## 2017-04-26 DIAGNOSIS — G4700 Insomnia, unspecified: Secondary | ICD-10-CM

## 2017-04-28 NOTE — Telephone Encounter (Signed)
Refilled: 03/12/2017 Last OV: 03/12/2017 Next OV: 05/07/2017

## 2017-04-28 NOTE — Telephone Encounter (Signed)
Just an [name] [name]  Patient is on PRN tramadol for chronic low back pain with good symptoms control.   I looked up patient on [place] Controlled Substances Reporting System and saw no activity that raised concern of inappropriate use.   Will continue medication for patient.

## 2017-04-29 ENCOUNTER — Telehealth: Payer: Self-pay | Admitting: Radiology

## 2017-04-29 DIAGNOSIS — I700 Atherosclerosis of aorta: Secondary | ICD-10-CM

## 2017-04-29 DIAGNOSIS — E118 Type 2 diabetes mellitus with unspecified complications: Secondary | ICD-10-CM

## 2017-04-29 NOTE — Telephone Encounter (Signed)
Pt coming in for labs Thursday, please place future orders. Thank you.  

## 2017-04-29 NOTE — Telephone Encounter (Signed)
Refilled: 02/28/2017 Last OV: 03/12/2017 Next OV: 05/07/2017

## 2017-04-30 NOTE — Telephone Encounter (Signed)
Added lipid panel He may want to wait on this as just done in January and usually takes a few months to move

## 2017-04-30 NOTE — Addendum Note (Signed)
Addended by: [name] on: 04/30/2017 02:44 PM   Modules accepted: Orders

## 2017-04-30 NOTE — Telephone Encounter (Signed)
Patients wife advised of below and verbalized understanding  

## 2017-04-30 NOTE — Telephone Encounter (Signed)
Call pt  In all of our visits, I dont think we have discussed klonopin use since he came from Dr Lacinda Axon  Please get some info on how often he uses and for what reason.   Ensure he understands he needs to be seen approx every 3-4 months for f/u as controlled substance.   Also ensure he is aware of long term risks - namely cognitive decline on regular use of benzodiazepines such as klonpopin. Advise to use as sparingly as possible.  A lot of patients see better results on SSRI drug class ( prozac, zoloft, paxil etc).   I have refiled  I looked up patient on [place] Controlled Substances Reporting System and saw no activity that raised concern of inappropriate use.

## 2017-04-30 NOTE — Telephone Encounter (Addendum)
Spoke to patients wife  Per DPR advised according to Margaret's note she hadn't ordered any labs.   He would be due for A1c on 06/10/17 she would have him call back to schedule fasting labs as stated in your last note 03/12/17

## 2017-04-30 NOTE — Addendum Note (Signed)
Addended by: [name] on: 04/30/2017 09:52 AM   Modules accepted: Orders

## 2017-04-30 NOTE — Telephone Encounter (Signed)
Call pt  A1c order placed however he is not quite at 3 months.. That woule 5/28 and insurance would like cover then  Was there another lab he wanted?  I didn't see any  Notes from me regarding another lab to draw.  He may want to reschedule lab appt scheduled tomorrow for end of May.   Future a1c placed

## 2017-05-01 ENCOUNTER — Other Ambulatory Visit: Payer: Medicare Other

## 2017-05-03 ENCOUNTER — Other Ambulatory Visit: Payer: Self-pay | Admitting: Family

## 2017-05-07 ENCOUNTER — Ambulatory Visit: Payer: Medicare Other | Admitting: Family

## 2017-05-07 ENCOUNTER — Ambulatory Visit: Payer: Medicare Other | Admitting: Internal Medicine

## 2017-05-08 ENCOUNTER — Encounter: Payer: Self-pay | Admitting: *Deleted

## 2017-05-08 NOTE — Telephone Encounter (Signed)
Sent mychart message

## 2017-05-15 ENCOUNTER — Ambulatory Visit (INDEPENDENT_AMBULATORY_CARE_PROVIDER_SITE_OTHER): Payer: Medicare Other | Admitting: Internal Medicine

## 2017-05-15 ENCOUNTER — Encounter: Payer: Self-pay | Admitting: Internal Medicine

## 2017-05-15 VITALS — BP 104/62 | HR 65 | Temp 98.5°F | Ht 73.0 in | Wt 204.8 lb

## 2017-05-15 DIAGNOSIS — Z85828 Personal history of other malignant neoplasm of skin: Secondary | ICD-10-CM

## 2017-05-15 DIAGNOSIS — J322 Chronic ethmoidal sinusitis: Secondary | ICD-10-CM

## 2017-05-15 DIAGNOSIS — C4492 Squamous cell carcinoma of skin, unspecified: Secondary | ICD-10-CM | POA: Insufficient documentation

## 2017-05-15 DIAGNOSIS — M4802 Spinal stenosis, cervical region: Secondary | ICD-10-CM

## 2017-05-15 MED ORDER — METHOCARBAMOL 500 MG PO TABS: 500.0000 mg | ORAL_TABLET | Freq: Every evening | ORAL | 0 refills | Status: DC | PRN

## 2017-05-15 MED ORDER — DOXYCYCLINE HYCLATE 100 MG PO TABS: 100.0000 mg | ORAL_TABLET | Freq: Two times a day (BID) | ORAL | 0 refills | Status: DC

## 2017-05-15 NOTE — Progress Notes (Signed)
Pre visit review using our clinic review tool, if applicable. No additional management support is needed unless otherwise documented below in the visit note. 

## 2017-05-15 NOTE — Progress Notes (Signed)
Chief Complaint  Patient presents with  . Follow-up   F/u  1. C/o neck stiffness and dec. ROM. Reviewed MRI 08/2015 +mild spinal stenosis with mild foraminal stenosis b/l C3/4, C4/5 diffuse uninate spurring right>left, foraminal encroachement R>L He had tried Tylenol arthritis, heat, changing pillow. Sxs started Monday worse Tuesday having dec ROM in all directions. Yesterday pain 7-8/10 with moving today 2-3/10.   2. C/o frontal and ethmoid sinus pressure fever fever 99.5 yesterday and having cough clear sputum, also chills   Review of Systems  Constitutional: Positive for chills and fever.  HENT: Positive for sinus pain. Negative for hearing loss.   Eyes: Negative for blurred vision.  Respiratory: Negative for shortness of breath.   Cardiovascular: Negative for chest pain.  Musculoskeletal: Positive for neck pain.  Skin: Negative for rash.   Past Medical History:  Diagnosis Date  . Aortic insufficiency    a. noted on TTE 2015  . Arthritis   . CAD (coronary artery disease)    a. remote PCI in 1991 and 2005; b. MV 3/15: old inferior MI, no ischemia, LVEF 50%, slight inferior wall hypokniesis  . Chicken pox   . Colon polyps    4 pre-cancerous   . Diverticulitis   . DM type 2 (diabetes mellitus, type 2) ([place])   . Hyperlipidemia   . Hypertension   . Kidney stones   . Melanoma ([place]) 1980   Resected from his back  . Mitral regurgitation    a. noted on TTE 2015  . Myocardial infarction ([place])   . Systolic dysfunction    a. TTE 2015: EF  50-55%, mild global hypokinesis, mild to moderate aortic sclerosis, mild aortic insufficiency, mild to moderate mitral regurgitation, mild tricuspid regurgitation, moderately dilated left atrium, mildly dilated right ventricle, PASP 31 mmHg   Past Surgical History:  Procedure Laterality Date  . CHOLECYSTECTOMY  2012  . COLONOSCOPY    . CORONARY ANGIOPLASTY WITH STENT PLACEMENT  1991 & 2005  . ESOPHAGOGASTRODUODENOSCOPY (EGD) WITH PROPOFOL N/A  11/19/2016   Procedure: ESOPHAGOGASTRODUODENOSCOPY (EGD) WITH PROPOFOL;  Surgeon: [name], MD;  Location[facility] ENDOSCOPY;  Service: Endoscopy;  Laterality: N/A;  . LITHOTRIPSY  2015  . MELANOMA EXCISION  1980   malignant  . NERVE SURGERY  2015   ulna nerve  . TONSILLECTOMY  1945  . WISDOM TOOTH EXTRACTION     Family History  Problem Relation Age of Onset  . Hyperlipidemia Mother   . Hypertension Mother   . Heart disease Mother   . Diabetes Mother   . Colon cancer Father   . Lung cancer Father   . Kidney cancer Father        malignant capsulated kidney tumor  . Diabetes Father   . Bladder Cancer Neg Hx   . Prostate cancer Neg Hx    Social History   Socioeconomic History  . Marital status: Married    Spouse name: Not on file  . Number of children: Not on file  . Years of education: Not on file  . Highest education level: Not on file  Occupational History  . Not on file  Social Needs  . Financial resource strain: Not on file  . Food insecurity:    Worry: Not on file    Inability: Not on file  . Transportation needs:    Medical: Not on file    Non-medical: Not on file  Tobacco Use  . Smoking status: Current Some Day Smoker    Types: Pipe  .  Smokeless tobacco: Former Systems developer    Quit date: 10/17/2015  Substance and Sexual Activity  . Alcohol use: Yes    Alcohol/week: 0.0 - 0.6 oz    Comment: none last 24hrs  . Drug use: No  . Sexual activity: Not on file  Lifestyle  . Physical activity:    Days per week: Not on file    Minutes per session: Not on file  . Stress: Not on file  Relationships  . Social connections:    Talks on phone: Not on file    Gets together: Not on file    Attends religious service: Not on file    Active member of club or organization: Not on file    Attends meetings of clubs or organizations: Not on file    Relationship status: Not on file  . Intimate partner violence:    Fear of current or ex partner: Not on file    Emotionally abused:  Not on file    Physically abused: Not on file    Forced sexual activity: Not on file  Other Topics Concern  . Not on file  Social History Narrative   Married    Current Meds  Medication Sig  . aspirin EC 81 MG tablet Take by mouth daily.   [name] atorvastatin (LIPITOR) 80 MG tablet TAKE 1 TABLET(80 MG) BY MOUTH DAILY  . carvedilol (COREG) 3.125 MG tablet Take 1 tablet (3.125 mg total) by mouth 2 (two) times daily.  . clonazePAM (KLONOPIN) 0.5 MG tablet TAKE 1 TABLET BY MOUTH TWICE DAILY AS NEEDED FOR ANXIETY  . fenofibrate 160 MG tablet TAKE 1 TABLET BY MOUTH EVERY DAY  . fluticasone (FLONASE) 50 MCG/ACT nasal spray Place 2 sprays into both nostrils daily. (Patient taking differently: Place 2 sprays into both nostrils daily as needed. )  . glipiZIDE (GLUCOTROL XL) 5 MG 24 hr tablet Take 1 tablet (5 mg total) by mouth daily with breakfast.  . glucose blood test strip Use as instructed to check blood sugar up to three times daily. E11.42  . hydrochlorothiazide (HYDRODIURIL) 25 MG tablet TAKE 1 TABLET BY MOUTH EVERY DAY  . lisinopril (PRINIVIL,ZESTRIL) 10 MG tablet TAKE 1 TABLET BY MOUTH DAILY  . Multiple Vitamin (MULTIVITAMIN) capsule Take 1 capsule by mouth daily.  . Omega-3 Fatty Acids (FISH OIL PO) Take by mouth daily.  . pantoprazole (PROTONIX) 40 MG tablet Take 1 tablet (40 mg total) 2 (two) times daily by mouth.  . psyllium (METAMUCIL) 58.6 % powder Take 1 packet by mouth daily.  . tamsulosin (FLOMAX) 0.4 MG CAPS capsule TAKE 1 CAPSULE(0.4 MG) BY MOUTH DAILY  . traMADol (ULTRAM) 50 MG tablet TAKE 1 TABLET BY MOUTH EVERY 8 HOURS AS NEEDED   Allergies  Allergen Reactions  . Azithromycin    Recent Results (from the past 2160 hour(s))  Basic Metabolic Panel (BMET)     Status: Abnormal   Collection Time: 03/13/17  8:13 AM  Result Value Ref Range   Sodium 137 135 - 145 mEq/L   Potassium 3.7 3.5 - 5.1 mEq/L   Chloride 102 96 - 112 mEq/L   CO2 26 19 - 32 mEq/L   Glucose, Bld 143 (H) 70  - 99 mg/dL   BUN 23 6 - 23 mg/dL   Creatinine, Ser 0.88 0.40 - 1.50 mg/dL   Calcium 10.1 8.4 - 10.5 mg/dL   GFR 89.22 >60.00 mL/min  Lipid panel     Status: Abnormal   Collection Time: 03/13/17  8:13  AM  Result Value Ref Range   Cholesterol 131 0 - 200 mg/dL    Comment: ATP III Classification       Desirable:  < 200 mg/dL               Borderline High:  200 - 239 mg/dL          High:  > = 240 mg/dL   Triglycerides 172.0 (H) 0.0 - 149.0 mg/dL    Comment: Normal:  <150 mg/dLBorderline High:  150 - 199 mg/dL   HDL 36.10 (L) >39.00 mg/dL   VLDL 34.4 0.0 - 40.0 mg/dL   LDL Cholesterol 61 0 - 99 mg/dL   Total CHOL/HDL Ratio 4     Comment:                Men          Women1/2 Average Risk     3.4          3.3Average Risk          5.0          4.42X Average Risk          9.6          7.13X Average Risk          15.0          11.0                       NonHDL 94.96     Comment: NOTE:  Non-HDL goal should be 30 mg/dL higher than patient's LDL goal (i.e. LDL goal of < 70 mg/dL, would have non-HDL goal of < 100 mg/dL)  Hemoglobin A1c     Status: Abnormal   Collection Time: 03/13/17  8:13 AM  Result Value Ref Range   Hgb A1c MFr Bld 6.9 (H) 4.6 - 6.5 %    Comment: Glycemic Control Guidelines for People with Diabetes:Non Diabetic:  <6%Goal of Therapy: <7%Additional Action Suggested:  >8%    Objective  Body mass index is 27.02 kg/m. Wt Readings from Last 3 Encounters:  05/15/17 204 lb 12.8 oz (92.9 kg)  04/10/17 203 lb (92.1 kg)  03/14/17 201 lb (91.2 kg)   Temp Readings from Last 3 Encounters:  05/15/17 98.5 F (36.9 C) (Oral)  03/12/17 97.8 F (36.6 C) (Oral)  01/22/17 98.2 F (36.8 C) (Oral)   BP Readings from Last 3 Encounters:  05/15/17 104/62  04/10/17 123/74  03/14/17 108/66   Pulse Readings from Last 3 Encounters:  05/15/17 65  04/10/17 70  03/14/17 (!) 52    Physical Exam  Constitutional: He is oriented to person, place, and time. Vital signs are normal. He appears  well-developed and well-nourished. He is cooperative.  HENT:  Head: Normocephalic and atraumatic.  Nose: Right sinus exhibits frontal sinus tenderness. Left sinus exhibits frontal sinus tenderness.  Mouth/Throat: Oropharynx is clear and moist and mucous membranes are normal.  B/l ethmoid ttp   Eyes: Pupils are equal, round, and reactive to light. Conjunctivae are normal.  Cardiovascular: Normal rate, regular rhythm and normal heart sounds.  Pulmonary/Chest: Effort normal and breath sounds normal.  Musculoskeletal: He exhibits tenderness.       Cervical back: He exhibits tenderness.  Reduce ROM esp left  ttp left SCM posterior/trapezius muscle  Reduced ROM   Neurological: He is alert and oriented to person, place, and time. Gait normal.  Skin: Skin is warm, dry and intact.  Psychiatric: He  has a normal mood and affect. His speech is normal and behavior is normal. Judgment and thought content normal. Cognition and memory are normal.  Nursing note and vitals reviewed.   Assessment   1. Cervical stenosis with reduced ROM all directions esp left MRI 08/2015 see HPI 2. Sinusitis  Plan   1.  Refer to [place] [facility] 2-3 x per week x 6 weeks  If this does not work ortho spine or NS  Trial of robaxin, heat, prn tylenol or tramadol  Will use wifes Voltaren gel to see if helps   2.  Doxy bid x 1 week    Of note just had SCC removed Dr. Evorn Gong to scalp  Provider: Dr. Olivia Mackie McLean-Scocuzza-Internal Medicine

## 2017-05-15 NOTE — Patient Instructions (Addendum)
F/u as scheduled sooner if needed   Try Voltaren gel 2x per day to neck  Take robaxin as needed at night  We referred to [facility] for PT 2-3 x per week x 6 weeks  If this does not help you will need referral to ortho spine or neurosurgery   Spinal Stenosis Spinal stenosis occurs when the open space (spinal canal) between the bones of your spine (vertebrae) narrows, putting pressure on the spinal cord or nerves. What are the causes? This condition is caused by areas of bone pushing into the central canals of your vertebrae. This condition may be present at birth (congenital), or it may be caused by:  Arthritic deterioration of your vertebrae (spinal degeneration). This usually starts around age 20.  Injury or trauma to the spine.  Tumors in the spine.  Calcium deposits in the spine.  What are the signs or symptoms? Symptoms of this condition include:  Pain in the neck or back that is generally worse with activities, particularly when standing and walking.  Numbness, tingling, hot or cold sensations, weakness, or weariness in your legs.  Pain going up and down the leg (sciatica).  Frequent episodes of falling.  A foot-slapping gait that leads to muscle weakness.  In more serious cases, you may develop:  Problemspassing stool or passing urine.  Difficulty having sex.  Loss of feeling in part or all of your leg.  Symptoms may come on slowly and get worse over time. How is this diagnosed? This condition is diagnosed based on your medical history and a physical exam. Tests will also be done, such as:  MRI.  CT scan.  X-ray.  How is this treated? Treatment for this condition often focuses on managing your pain and any other symptoms. Treatment may include:  Practicing good posture to lessen pressure on your nerves.  Exercising to strengthen muscles, build endurance, improve balance, and maintain good joint movement (range of motion).  Losing weight, if  needed.  Taking medicines to reduce swelling, inflammation, or pain.  Assistive devices, such as a corset or brace.  In some cases, surgery may be needed. The most common procedure is decompression laminectomy. This is done to remove excess bone that puts pressure on your nerve roots. Follow these instructions at home: Managing pain, stiffness, and swelling  Do all exercises and stretches as told by your health care provider.  Practice good posture. If you were given a brace or a corset, wear it as told by your health care provider.  Do not do any activities that cause pain. Ask your health care provider what activities are safe for you.  Do not lift anything that is heavier than 10 lb (4.5 kg) or the limit that your health care provider tells you.  Maintain a healthy weight. Talk with your health care provider if you need help losing weight.  If directed, apply heat to the affected area as often as told by your health care provider. Use the heat source that your health care provider recommends, such as a moist heat pack or a heating pad. ? Place a towel between your skin and the heat source. ? Leave the heat on for 20-30 minutes. ? Remove the heat if your skin turns bright red. This is especially important if you are not able to feel pain, heat, or cold. You may have a greater risk of getting burned. General instructions  Take over-the-counter and prescription medicines only as told by your health care provider.  Do not use any products that contain nicotine or tobacco, such as cigarettes and e-cigarettes. If you need help quitting, ask your health care provider.  Eat a healthy diet. This includes plenty of fruits and vegetables, whole grains, and low-fat (lean) protein.  Keep all follow-up visits as told by your health care provider. This is important. Contact a health care provider if:  Your symptoms do not get better or they get worse.  You have a fever. Get help right away  if:  You have new or worse pain in your neck or upper back.  You have severe pain that cannot be controlled with medicines.  You are dizzy.  You have vision problems, blurred vision, or double vision.  You have a severe headache that is worse when you stand.  You have nausea or you vomit.  You develop new or worse numbness or tingling in your back or legs.  You have pain, redness, swelling, or warmth in your arm or leg. Summary  Spinal stenosis occurs when the open space (spinal canal) between the bones of your spine (vertebrae) narrows. This narrowing puts pressure on the spinal cord or nerves.  Spinal stenosis can cause numbness, weakness, or pain in the neck, back, and legs.  This condition may be caused by a birth defect, arthritic deterioration of your vertebrae, injury, tumors, or calcium deposits.  This condition is usually diagnosed with MRIs, CT scans, and X-rays. This information is not intended to replace advice given to you by your health care provider. Make sure you discuss any questions you have with your health care provider. Document Released: 03/23/2003 Document Revised: 12/06/2015 Document Reviewed: 12/06/2015 Elsevier Interactive Patient Education  2018 [employer].   Sinusitis, Adult Sinusitis is soreness and inflammation of your sinuses. Sinuses are hollow spaces in the bones around your face. Your sinuses are located:  Around your eyes.  In the middle of your forehead.  Behind your nose.  In your cheekbones.  Your sinuses and nasal passages are lined with a stringy fluid (mucus). Mucus normally drains out of your sinuses. When your nasal tissues become inflamed or swollen, the mucus can become trapped or blocked so air cannot flow through your sinuses. This allows bacteria, viruses, and funguses to grow, which leads to infection. Sinusitis can develop quickly and last for 7?10 days (acute) or for more than 12 weeks (chronic). Sinusitis often develops  after a cold. What are the causes? This condition is caused by anything that creates swelling in the sinuses or stops mucus from draining, including:  Allergies.  Asthma.  Bacterial or viral infection.  Abnormally shaped bones between the nasal passages.  Nasal growths that contain mucus (nasal polyps).  Narrow sinus openings.  Pollutants, such as chemicals or irritants in the air.  A foreign object stuck in the nose.  A fungal infection. This is rare.  What increases the risk? The following factors may make you more likely to develop this condition:  Having allergies or asthma.  Having had a recent cold or respiratory tract infection.  Having structural deformities or blockages in your nose or sinuses.  Having a weak immune system.  Doing a lot of swimming or diving.  Overusing nasal sprays.  Smoking.  What are the signs or symptoms? The main symptoms of this condition are pain and a feeling of pressure around the affected sinuses. Other symptoms include:  Upper toothache.  Earache.  Headache.  Bad breath.  Decreased sense of smell and taste.  A  cough that may get worse at night.  Fatigue.  Fever.  Thick drainage from your nose. The drainage is often green and it may contain pus (purulent).  Stuffy nose or congestion.  Postnasal drip. This is when extra mucus collects in the throat or back of the nose.  Swelling and warmth over the affected sinuses.  Sore throat.  Sensitivity to light.  How is this diagnosed? This condition is diagnosed based on symptoms, a medical history, and a physical exam. To find out if your condition is acute or chronic, your health care provider may:  Look in your nose for signs of nasal polyps.  Tap over the affected sinus to check for signs of infection.  View the inside of your sinuses using an imaging device that has a light attached (endoscope).  If your health care provider suspects that you have chronic  sinusitis, you may also:  Be tested for allergies.  Have a sample of mucus taken from your nose (nasal culture) and checked for bacteria.  Have a mucus sample examined to see if your sinusitis is related to an allergy.  If your sinusitis does not respond to treatment and it lasts longer than 8 weeks, you may have an MRI or CT scan to check your sinuses. These scans also help to determine how severe your infection is. In rare cases, a bone biopsy may be done to rule out more serious types of fungal sinus disease. How is this treated? Treatment for sinusitis depends on the cause and whether your condition is chronic or acute. If a virus is causing your sinusitis, your symptoms will go away on their own within 10 days. You may be given medicines to relieve your symptoms, including:  Topical nasal decongestants. They shrink swollen nasal passages and let mucus drain from your sinuses.  Antihistamines. These drugs block inflammation that is triggered by allergies. This can help to ease swelling in your nose and sinuses.  Topical nasal corticosteroids. These are nasal sprays that ease inflammation and swelling in your nose and sinuses.  Nasal saline washes. These rinses can help to get rid of thick mucus in your nose.  If your condition is caused by bacteria, you will be given an antibiotic medicine. If your condition is caused by a fungus, you will be given an antifungal medicine. Surgery may be needed to correct underlying conditions, such as narrow nasal passages. Surgery may also be needed to remove polyps. Follow these instructions at home: Medicines  Take, use, or apply over-the-counter and prescription medicines only as told by your health care provider. These may include nasal sprays.  If you were prescribed an antibiotic medicine, take it as told by your health care provider. Do not stop taking the antibiotic even if you start to feel better. Hydrate and Humidify  Drink enough water  to keep your urine clear or pale yellow. Staying hydrated will help to thin your mucus.  Use a cool mist humidifier to keep the humidity level in your home above 50%.  Inhale steam for 10-15 minutes, 3-4 times a day or as told by your health care provider. You can do this in the bathroom while a hot shower is running.  Limit your exposure to cool or dry air. Rest  Rest as much as possible.  Sleep with your head raised (elevated).  Make sure to get enough sleep each night. General instructions  Apply a warm, moist washcloth to your face 3-4 times a day or as told by  your health care provider. This will help with discomfort.  Wash your hands often with soap and water to reduce your exposure to viruses and other germs. If soap and water are not available, use hand sanitizer.  Do not smoke. Avoid being around people who are smoking (secondhand smoke).  Keep all follow-up visits as told by your health care provider. This is important. Contact a health care provider if:  You have a fever.  Your symptoms get worse.  Your symptoms do not improve within 10 days. Get help right away if:  You have a severe headache.  You have persistent vomiting.  You have pain or swelling around your face or eyes.  You have vision problems.  You develop confusion.  Your neck is stiff.  You have trouble breathing. This information is not intended to replace advice given to you by your health care provider. Make sure you discuss any questions you have with your health care provider. Document Released: 12/31/2004 Document Revised: 08/27/2015 Document Reviewed: 10/26/2014 Elsevier Interactive Patient Education  [employer].

## 2017-05-20 ENCOUNTER — Other Ambulatory Visit: Payer: Self-pay

## 2017-05-20 MED ORDER — HYDROCHLOROTHIAZIDE 25 MG PO TABS: 25.0000 mg | ORAL_TABLET | Freq: Every day | ORAL | 1 refills | Status: DC

## 2017-05-27 ENCOUNTER — Ambulatory Visit (INDEPENDENT_AMBULATORY_CARE_PROVIDER_SITE_OTHER): Payer: Medicare Other | Admitting: Family Medicine

## 2017-05-27 ENCOUNTER — Encounter: Payer: Self-pay | Admitting: Family Medicine

## 2017-05-27 ENCOUNTER — Encounter

## 2017-05-27 VITALS — BP 140/74 | HR 60 | Temp 97.8°F | Resp 15 | Ht 73.0 in | Wt 204.4 lb

## 2017-05-27 DIAGNOSIS — J322 Chronic ethmoidal sinusitis: Secondary | ICD-10-CM | POA: Diagnosis not present

## 2017-05-27 DIAGNOSIS — I2510 Atherosclerotic heart disease of native coronary artery without angina pectoris: Secondary | ICD-10-CM

## 2017-05-27 NOTE — Progress Notes (Signed)
Patient ID: [name], male   DOB: [birth year], 77 y.o.   MRN: [id number]  PCP: [name], [name]  Subjective:  [name] [name] is a 77 y.o. year old very pleasant male patient who presents with symptoms of clear nasal drainage that has been present.  Associated mild post nasal drip and minimal cough have been present and are improving daily. He was treated on 05/15/17 for sinusitis and was provided doxycycline which he has completed. He reports improvement of his symptoms and states that he feels "much better" than when he was seen on 05/15/17.   -started: 12 days ago , symptoms are improving -previous treatments: Coricidin has provided benefit for symptoms previously  -sick contacts/travel/risks: denies flu exposure. He denies sick contact exposure or flu exposure -Hx of: T2DM   ROS-denies fever, SOB, NVD, sore throat, nasal congestion, tooth pain  Pertinent Past Medical History- CAD, HTN  Medications- reviewed  Current Outpatient Medications  Medication Sig Dispense Refill  . aspirin EC 81 MG tablet Take by mouth daily.     [name] atorvastatin (LIPITOR) 80 MG tablet TAKE 1 TABLET(80 MG) BY MOUTH DAILY 90 tablet 1  . carvedilol (COREG) 3.125 MG tablet Take 1 tablet (3.125 mg total) by mouth 2 (two) times daily. 180 tablet 3  . clonazePAM (KLONOPIN) 0.5 MG tablet TAKE 1 TABLET BY MOUTH TWICE DAILY AS NEEDED FOR ANXIETY 60 tablet 0  . fenofibrate 160 MG tablet TAKE 1 TABLET BY MOUTH EVERY DAY 90 tablet 1  . fluticasone (FLONASE) 50 MCG/ACT nasal spray Place 2 sprays into both nostrils daily. (Patient taking differently: Place 2 sprays into both nostrils daily as needed. ) 16 g 6  . glipiZIDE (GLUCOTROL XL) 5 MG 24 hr tablet Take 1 tablet (5 mg total) by mouth daily with breakfast. 90 tablet 1  . glucose blood test strip Use as instructed to check blood sugar up to three times daily. E11.42 100 each 12  . hydrochlorothiazide (HYDRODIURIL) 25 MG tablet Take 1 tablet (25 mg total) by  mouth daily. 90 tablet 1  . lisinopril (PRINIVIL,ZESTRIL) 10 MG tablet TAKE 1 TABLET BY MOUTH DAILY 90 tablet 0  . methocarbamol (ROBAXIN) 500 MG tablet Take 1 tablet (500 mg total) by mouth at bedtime as needed for muscle spasms. 30 tablet 0  . Multiple Vitamin (MULTIVITAMIN) capsule Take 1 capsule by mouth daily.    . Omega-3 Fatty Acids (FISH OIL PO) Take by mouth daily.    . pantoprazole (PROTONIX) 40 MG tablet Take 1 tablet (40 mg total) 2 (two) times daily by mouth. 30 tablet 11  . psyllium (METAMUCIL) 58.6 % powder Take 1 packet by mouth daily.    . tamsulosin (FLOMAX) 0.4 MG CAPS capsule TAKE 1 CAPSULE(0.4 MG) BY MOUTH DAILY 90 capsule 0  . traMADol (ULTRAM) 50 MG tablet TAKE 1 TABLET BY MOUTH EVERY 8 HOURS AS NEEDED 90 tablet 1   No current facility-administered medications for this visit.     Objective: BP 140/74 (BP Location: Left Arm, Patient Position: Sitting, Cuff Size: Normal)   Pulse (!) 55   Temp 97.8 F (36.6 C) (Oral)   Resp 15   Ht 6\' 1"  (1.854 m)   Wt 204 lb 6.4 oz (92.7 kg)   SpO2 96%   BMI 26.97 kg/m  Gen: NAD, resting comfortably HEENT: TMs normal bilaterally, oropharynx clear and moist,  no sinus tenderness CV: RRR  Lungs: CTAB no crackles, wheeze, rhonchi Abdomen: soft/nontender/nondistended/normal bowel sounds. No rebound or guarding.  Ext: no edema Skin: warm, dry, no rash Neuro: grossly normal, moves all extremities  Assessment/Plan: 1. Ethmoid sinusitis, unspecified chronicity Resolving; symptoms are improving daily; he reports feeling well; doxycycline has been completed. No further treatment recommended at this time. Advised that he continue conservative measures of remaining hydrated and he can consider nasal saline rinses or can use flonase if needed. Advised that he follow up if symptoms do not continue to improve and fully resolve. Further advised that he seek evaluation if he notices any fever or SOB. He voiced understanding and agreed with  plan.   We discussed that we did not find any infection that had higher probability of being bacterial such as pneumonia or strep throat. We discussed signs that bacterial infection may have developed particularly fever or shortness of breath.  Finally, we reviewed reasons to return to care including if symptoms worsen or persist or new concerns arise- once again particularly shortness of breath or fever.    Laurita Quint, [name]

## 2017-05-27 NOTE — Patient Instructions (Signed)
Remain hydrated, drinking enough water to keep urine pale yellow or clear. Can use nasal saline rinses or flonase if needed.   Follow up if symptoms do not continue to improve or if you notice any fever or shortness of breath.

## 2017-05-28 ENCOUNTER — Ambulatory Visit: Payer: Medicare Other | Admitting: Family

## 2017-05-30 DIAGNOSIS — M542 Cervicalgia: Secondary | ICD-10-CM | POA: Diagnosis not present

## 2017-06-02 DIAGNOSIS — M542 Cervicalgia: Secondary | ICD-10-CM | POA: Diagnosis not present

## 2017-06-03 ENCOUNTER — Other Ambulatory Visit: Payer: Self-pay

## 2017-06-03 MED ORDER — PANTOPRAZOLE SODIUM 40 MG PO TBEC: 40.0000 mg | DELAYED_RELEASE_TABLET | Freq: Two times a day (BID) | ORAL | 6 refills | Status: DC

## 2017-06-04 DIAGNOSIS — M542 Cervicalgia: Secondary | ICD-10-CM | POA: Diagnosis not present

## 2017-06-10 ENCOUNTER — Other Ambulatory Visit (INDEPENDENT_AMBULATORY_CARE_PROVIDER_SITE_OTHER): Payer: Medicare Other

## 2017-06-10 DIAGNOSIS — E118 Type 2 diabetes mellitus with unspecified complications: Secondary | ICD-10-CM

## 2017-06-10 DIAGNOSIS — I700 Atherosclerosis of aorta: Secondary | ICD-10-CM | POA: Diagnosis not present

## 2017-06-10 DIAGNOSIS — M542 Cervicalgia: Secondary | ICD-10-CM | POA: Diagnosis not present

## 2017-06-10 LAB — LIPID PANEL
Cholesterol: 134 mg/dL (ref 0–200)
HDL: 39.7 mg/dL (ref >39.00)
LDL Cholesterol: 70 mg/dL (ref 0–99)
NonHDL: 94.7
Total CHOL/HDL Ratio: 3
Triglycerides: 125 mg/dL (ref 0.0–149.0)
VLDL: 25 mg/dL (ref 0.0–40.0)

## 2017-06-10 LAB — HEMOGLOBIN A1C: Hgb A1c MFr Bld: 6.9 % — ABNORMAL HIGH (ref 4.6–6.5)

## 2017-06-12 DIAGNOSIS — M542 Cervicalgia: Secondary | ICD-10-CM | POA: Diagnosis not present

## 2017-06-14 ENCOUNTER — Other Ambulatory Visit: Payer: Self-pay | Admitting: Family

## 2017-06-17 DIAGNOSIS — M542 Cervicalgia: Secondary | ICD-10-CM | POA: Diagnosis not present

## 2017-06-18 ENCOUNTER — Encounter: Payer: Self-pay | Admitting: Family

## 2017-06-18 ENCOUNTER — Ambulatory Visit (INDEPENDENT_AMBULATORY_CARE_PROVIDER_SITE_OTHER): Payer: Medicare Other | Admitting: Family

## 2017-06-18 VITALS — BP 160/78 | HR 54 | Temp 98.6°F | Resp 16 | Wt 202.5 lb

## 2017-06-18 DIAGNOSIS — E118 Type 2 diabetes mellitus with unspecified complications: Secondary | ICD-10-CM | POA: Diagnosis not present

## 2017-06-18 DIAGNOSIS — G8929 Other chronic pain: Secondary | ICD-10-CM | POA: Diagnosis not present

## 2017-06-18 DIAGNOSIS — I2510 Atherosclerotic heart disease of native coronary artery without angina pectoris: Secondary | ICD-10-CM

## 2017-06-18 DIAGNOSIS — R200 Anesthesia of skin: Secondary | ICD-10-CM | POA: Diagnosis not present

## 2017-06-18 DIAGNOSIS — M542 Cervicalgia: Secondary | ICD-10-CM

## 2017-06-18 DIAGNOSIS — I10 Essential (primary) hypertension: Secondary | ICD-10-CM | POA: Diagnosis not present

## 2017-06-18 DIAGNOSIS — M4802 Spinal stenosis, cervical region: Secondary | ICD-10-CM | POA: Diagnosis not present

## 2017-06-18 DIAGNOSIS — M545 Low back pain: Secondary | ICD-10-CM | POA: Diagnosis not present

## 2017-06-18 MED ORDER — LIDOCAINE 5 % EX PTCH: 1.0000 | MEDICATED_PATCH | CUTANEOUS | 1 refills | Status: DC

## 2017-06-18 MED ORDER — GLUCOSE BLOOD VI STRP: ORAL_STRIP | 12 refills | Status: DC

## 2017-06-18 MED ORDER — DICLOFENAC SODIUM 1 % TD GEL: 4.0000 g | Freq: Four times a day (QID) | TRANSDERMAL | 3 refills | Status: DC

## 2017-06-18 NOTE — Assessment & Plan Note (Addendum)
Unchanged.  Responds completely to ibuprofen.  Advised the use of this with discretion as may elevate blood pressure.  Discontinue tramadol as not effective.  Will see if Robaxin is helpful. Patient will let me know

## 2017-06-18 NOTE — Assessment & Plan Note (Addendum)
Waxing and waning.  Suspect bilateral digit numbness is related.  Patient politely declines consult with interventional pain or orthopedics today.  He would like to try Robaxin nightly and continue physical therapy to see if can get relief.  He will notify me if pain is persistent.  Of note, Strongly encouraged to continue physical therapy and range of motion exercises to prevent torticollis.

## 2017-06-18 NOTE — Assessment & Plan Note (Signed)
Unchanged. Reviewed MRI 2017 of neck, discussed with patient suspect bilateral numbness likely related to stenosis.

## 2017-06-18 NOTE — Assessment & Plan Note (Signed)
Well-controlled.  Continue current regimen. 

## 2017-06-18 NOTE — Assessment & Plan Note (Signed)
Elevated today as patient did not take blood pressure medications this morning.  He will take as soon he gets home and notify me if blood pressure is not well controlled.

## 2017-06-18 NOTE — Patient Instructions (Addendum)
Call me if blood pressure once you take blood pressure medication.   Let me know if you would like to see orthopedics or interventional pain ( epidural etc).    Continue PT and excercises.   Trial of robaxin at night to see if helps. Do not take with alcohol or your klonopin. No driving on robaxin, operating machinery  Pleasure seeing you!

## 2017-06-18 NOTE — Progress Notes (Signed)
Subjective:    Patient ID: [name] [name], male    DOB: 07-21-1940, 77 y.o.   MRN: [id number]  CC: [name] [name] is a 77 y.o. male who presents today for follow up.   HPI: DM- compliant with medication.   HTN-compliant with medication.  Well controlled at home per patient. Hasnt taken blood pressure medication today.   Denies exertional chest pain or pressure, numbness or tingling radiating to left arm or jaw, palpitations, dizziness, frequent headaches, changes in vision, or shortness of breath.   Neck pain- reduced movement to the left. Took robaxin once . In PT with some relief. Thinks reading ( looking down) or sleeping 'wong' will have severe neck pain. When takes klonopin , hasnt noticed any relief with klonopin. Heat doesn't help. Notes numbness in bilateral 4th and 5th digits. No UE weakness.   Chronic LBP- aleve ( takes seldom) resolves pain within 15 minutes. Tramadol doesn't help.   Anxiety- needs refill of klonopin. Uses prn.    Sinus pressure has resolved.    Seen 5/2 for neck stiffness, sinus pressure MR cervical spine 2017- mild spinal stenosis C3-C4, C4 -C5; spurring.  HISTORY:  Past Medical History:  Diagnosis Date  . Aortic insufficiency    a. noted on TTE 2015  . Arthritis   . CAD (coronary artery disease)    a. remote PCI in 1991 and 2005; b. MV 3/15: old inferior MI, no ischemia, LVEF 50%, slight inferior wall hypokniesis  . Chicken pox   . Colon polyps    4 pre-cancerous   . Diverticulitis   . DM type 2 (diabetes mellitus, type 2) ([place])   . Hyperlipidemia   . Hypertension   . Kidney stones   . Melanoma ([place]) 1980   Resected from his back  . Mitral regurgitation    a. noted on TTE 2015  . Myocardial infarction ([place])   . Systolic dysfunction    a. TTE 2015: EF  50-55%, mild global hypokinesis, mild to moderate aortic sclerosis, mild aortic insufficiency, mild to moderate mitral regurgitation, mild tricuspid regurgitation, moderately dilated left  atrium, mildly dilated right ventricle, PASP 31 mmHg   Past Surgical History:  Procedure Laterality Date  . CHOLECYSTECTOMY  2012  . COLONOSCOPY    . CORONARY ANGIOPLASTY WITH STENT PLACEMENT  1991 & 2005  . ESOPHAGOGASTRODUODENOSCOPY (EGD) WITH PROPOFOL N/A 11/19/2016   Procedure: ESOPHAGOGASTRODUODENOSCOPY (EGD) WITH PROPOFOL;  Surgeon: [name], MD;  Location: [facility] ENDOSCOPY;  Service: Endoscopy;  Laterality: N/A;  . LITHOTRIPSY  2015  . MELANOMA EXCISION  1980   malignant  . NERVE SURGERY  2015   ulna nerve  . TONSILLECTOMY  1945  . WISDOM TOOTH EXTRACTION     Family History  Problem Relation Age of Onset  . Hyperlipidemia Mother   . Hypertension Mother   . Heart disease Mother   . Diabetes Mother   . Colon cancer Father   . Lung cancer Father   . Kidney cancer Father        malignant capsulated kidney tumor  . Diabetes Father   . Bladder Cancer Neg Hx   . Prostate cancer Neg Hx     Allergies: Azithromycin Current Outpatient Medications on File Prior to Visit  Medication Sig Dispense Refill  . aspirin EC 81 MG tablet Take by mouth daily.     [name] atorvastatin (LIPITOR) 80 MG tablet TAKE 1 TABLET(80 MG) BY MOUTH DAILY 90 tablet 1  . clonazePAM (KLONOPIN) 0.5 MG tablet TAKE 1  TABLET BY MOUTH TWICE DAILY AS NEEDED FOR ANXIETY 60 tablet 0  . fenofibrate 160 MG tablet TAKE 1 TABLET BY MOUTH EVERY DAY 90 tablet 1  . fluticasone (FLONASE) 50 MCG/ACT nasal spray Place 2 sprays into both nostrils daily. (Patient taking differently: Place 2 sprays into both nostrils daily as needed. ) 16 g 6  . glipiZIDE (GLUCOTROL XL) 5 MG 24 hr tablet Take 1 tablet (5 mg total) by mouth daily with breakfast. 90 tablet 1  . hydrochlorothiazide (HYDRODIURIL) 25 MG tablet Take 1 tablet (25 mg total) by mouth daily. 90 tablet 1  . lisinopril (PRINIVIL,ZESTRIL) 10 MG tablet TAKE 1 TABLET BY MOUTH DAILY 90 tablet 0  . methocarbamol (ROBAXIN) 500 MG tablet Take 1 tablet (500 mg total) by mouth at  bedtime as needed for muscle spasms. 30 tablet 0  . Multiple Vitamin (MULTIVITAMIN) capsule Take 1 capsule by mouth daily.    . Omega-3 Fatty Acids (FISH OIL PO) Take by mouth daily.    . pantoprazole (PROTONIX) 40 MG tablet Take 1 tablet (40 mg total) by mouth 2 (two) times daily. 30 tablet 6  . psyllium (METAMUCIL) 58.6 % powder Take 1 packet by mouth daily.    . tamsulosin (FLOMAX) 0.4 MG CAPS capsule TAKE 1 CAPSULE(0.4 MG) BY MOUTH DAILY 90 capsule 0  . carvedilol (COREG) 3.125 MG tablet Take 1 tablet (3.125 mg total) by mouth 2 (two) times daily. 180 tablet 3   No current facility-administered medications on file prior to visit.     Social History   Tobacco Use  . Smoking status: Current Some Day Smoker    Types: Pipe  . Smokeless tobacco: Former Systems developer    Quit date: 10/17/2015  Substance Use Topics  . Alcohol use: Yes    Alcohol/week: 0.0 - 0.6 oz    Comment: none last 24hrs  . Drug use: No    Review of Systems  Constitutional: Negative for chills and fever.  Respiratory: Negative for cough and shortness of breath.   Cardiovascular: Negative for chest pain and palpitations.  Gastrointestinal: Negative for nausea and vomiting.  Musculoskeletal: Positive for neck pain and neck stiffness.  Neurological: Positive for numbness (bilateral 4 asnd 5th digits).      Objective:    BP (!) 160/78 (BP Location: Left Arm, Patient Position: Sitting, Cuff Size: Normal)   Pulse (!) 54   Temp 98.6 F (37 C) (Oral)   Resp 16   Wt 202 lb 8 oz (91.9 kg)   SpO2 98%   BMI 26.72 kg/m  BP Readings from Last 3 Encounters:  06/18/17 (!) 160/78  05/27/17 140/74  05/15/17 104/62   Wt Readings from Last 3 Encounters:  06/18/17 202 lb 8 oz (91.9 kg)  05/27/17 204 lb 6.4 oz (92.7 kg)  05/15/17 204 lb 12.8 oz (92.9 kg)    Physical Exam  Constitutional: He appears well-developed and well-nourished.  Neck: Spinous process tenderness and muscular tenderness present. Decreased range of motion  present.    Point tenderness is noted on diagram.  Lateral movements range of motion slightly reduced. No erythema, swelling, increased warmth appreciated.  Skin intact.  Cardiovascular: Regular rhythm and normal heart sounds.  Pulmonary/Chest: Effort normal and breath sounds normal. No respiratory distress. He has no wheezes. He has no rhonchi. He has no rales.  Lymphadenopathy:       Head (left side): No submandibular and no preauricular adenopathy present.  Neurological: He is alert.  Sensation intact over hands, digits  bilaterally.  Skin: Skin is warm and dry.  Psychiatric: He has a normal mood and affect. His speech is normal and behavior is normal.  Vitals reviewed.      Assessment & Plan:   Problem List Items Addressed This Visit      Cardiovascular and Mediastinum   Essential hypertension    Elevated today as patient did not take blood pressure medications this morning.  He will take as soon he gets home and notify me if blood pressure is not well controlled.        Endocrine   Controlled type 2 diabetes mellitus with complication, without long-term current use of insulin ([place])    Well-controlled.  Continue current regimen.      Relevant Medications   glucose blood test strip     Other   Chronic low back pain    Unchanged.  Responds completely to ibuprofen.  Advised the use of this with discretion as may elevate blood pressure.  Discontinue tramadol as not effective.  Will see if Robaxin is helpful. Patient will let me know      Bilateral hand numbness    Unchanged. Reviewed MRI 2017 of neck, discussed with patient suspect bilateral numbness likely related to stenosis.      Cervical stenosis of spine - Primary    Waxing and waning.  Suspect bilateral digit numbness is related.  Patient politely declines consult with interventional pain or orthopedics today.  He would like to try Robaxin nightly and continue physical therapy to see if can get relief.  He will  notify me if pain is persistent.  Of note, Strongly encouraged to continue physical therapy and range of motion exercises to prevent torticollis.        Other Visit Diagnoses    Neck pain       Relevant Medications   lidocaine (LIDODERM) 5 %   diclofenac sodium (VOLTAREN) 1 % GEL       I have discontinued [name] "Ben"'s traMADol. I am also having him start on lidocaine and diclofenac sodium. Additionally, I am having him maintain his aspirin EC, fluticasone, Omega-3 Fatty Acids (FISH OIL PO), multivitamin, psyllium, atorvastatin, glipiZIDE, carvedilol, lisinopril, clonazePAM, fenofibrate, methocarbamol, hydrochlorothiazide, pantoprazole, tamsulosin, and glucose blood.   Meds ordered this encounter  Medications  . glucose blood test strip    Sig: Use as instructed to check blood sugar up to three times daily. E11.42    Dispense:  100 each    Refill:  12    Contour blood glucose strips  . lidocaine (LIDODERM) 5 %    Sig: Place 1 patch onto the skin daily. Remove & Discard patch within 12 hours.    Dispense:  30 patch    Refill:  1    Order Specific Question:   Supervising Provider    Answer:   Deborra Medina L [[id number]]  . diclofenac sodium (VOLTAREN) 1 % GEL    Sig: Apply 4 g topically 4 (four) times daily.    Dispense:  1 Tube    Refill:  3    Order Specific Question:   Supervising Provider    Answer:   Crecencio Mc [[id number]]    Return precautions given.   Risks, benefits, and alternatives of the medications and treatment plan prescribed today were discussed, and patient expressed understanding.   Education regarding symptom management and diagnosis given to patient on AVS.  Continue to follow with [name], [name] for routine health maintenance.  [name] and I agreed with plan.   [name], [name]

## 2017-06-19 DIAGNOSIS — M542 Cervicalgia: Secondary | ICD-10-CM | POA: Diagnosis not present

## 2017-06-24 DIAGNOSIS — M542 Cervicalgia: Secondary | ICD-10-CM | POA: Diagnosis not present

## 2017-06-26 DIAGNOSIS — M542 Cervicalgia: Secondary | ICD-10-CM | POA: Diagnosis not present

## 2017-07-01 ENCOUNTER — Ambulatory Visit
Admission: RE | Admit: 2017-07-01 | Discharge: 2017-07-01 | Disposition: A | Discharge status: Home / Self Care | Payer: Medicare Other | Source: Ambulatory Visit | Attending: Urology | Admitting: Urology

## 2017-07-01 DIAGNOSIS — N281 Cyst of kidney, acquired: Secondary | ICD-10-CM | POA: Diagnosis not present

## 2017-07-01 DIAGNOSIS — K760 Fatty (change of) liver, not elsewhere classified: Secondary | ICD-10-CM | POA: Insufficient documentation

## 2017-07-01 DIAGNOSIS — M542 Cervicalgia: Secondary | ICD-10-CM | POA: Diagnosis not present

## 2017-07-01 LAB — POCT I-STAT CREATININE: Creatinine, Ser: 0.8 mg/dL (ref 0.61–1.24)

## 2017-07-01 MED ORDER — GADOBENATE DIMEGLUMINE 529 MG/ML IV SOLN
19.0000 mL | Freq: Once | INTRAVENOUS | Status: AC | PRN
  Administered 2017-07-01: 19 mL via INTRAVENOUS

## 2017-07-02 ENCOUNTER — Other Ambulatory Visit: Payer: Self-pay | Admitting: Family

## 2017-07-02 DIAGNOSIS — G4700 Insomnia, unspecified: Secondary | ICD-10-CM

## 2017-07-02 NOTE — Telephone Encounter (Signed)
I looked up patient on [place] Controlled Substances Reporting System and saw no activity that raised concern of inappropriate use.   

## 2017-07-02 NOTE — Telephone Encounter (Signed)
Last filled 04/30/17 Next office visit 12/24/17 Last office visit 06/18/17

## 2017-07-04 ENCOUNTER — Ambulatory Visit (INDEPENDENT_AMBULATORY_CARE_PROVIDER_SITE_OTHER): Payer: Medicare Other

## 2017-07-04 VITALS — BP 138/72 | HR 67 | Temp 98.3°F | Resp 15 | Ht 72.0 in | Wt 203.0 lb

## 2017-07-04 DIAGNOSIS — Z0000 Encounter for general adult medical examination without abnormal findings: Secondary | ICD-10-CM | POA: Diagnosis not present

## 2017-07-04 NOTE — Patient Instructions (Addendum)
  [name] [name] , Thank you for taking time to come for your Medicare Wellness Visit. I appreciate your ongoing commitment to your health goals. Please review the following plan we discussed and let me know if I can assist you in the future.   Follow up as needed.    Bring a copy of your [place] and/or Living Will to be scanned into chart.  Routine maintenance follow up with your doctor.   Nice to meet you. Have a great day!  These are the goals we discussed: Goals    . DIET - INCREASE WATER INTAKE       This is a list of the screening recommended for you and due dates:  Health Maintenance  Topic Date Due  . Complete foot exam   01/15/2016  . Pneumonia vaccines (2 of 2 - PPSV23) 01/15/2016  . Eye exam for diabetics  07/22/2017  . Flu Shot  08/14/2017  . Hemoglobin A1C  12/11/2017  . Tetanus Vaccine  01/15/2023

## 2017-07-04 NOTE — Progress Notes (Addendum)
Subjective:   Esias Mory is a 77 y.o. male who presents for an Initial Medicare Annual Wellness Visit.  Review of Systems  No ROS.  Medicare Wellness Visit. Additional risk factors are reflected in the social history.  Cardiac Risk Factors include: advanced age (>36men, >73 women);male gender;diabetes mellitus;hypertension    Objective:    Today's Vitals   07/04/17 1415  BP: 138/72  Pulse: 67  Resp: 15  Temp: 98.3 F (36.8 C)  TempSrc: Oral  SpO2: 95%  Weight: 203 lb (92.1 kg)  Height: 6' (1.829 m)   Body mass index is 27.53 kg/m.  Advanced Directives 07/04/2017  Does Patient Have a Medical Advance Directive? Yes  Type of Paramedic of [place];Living will  Does patient want to make changes to medical advance directive? No - Patient declined  Copy of [place] in Chart? No - copy requested    Current Medications (verified) Outpatient Encounter Medications as of 07/04/2017  Medication Sig  . aspirin EC 81 MG tablet Take by mouth daily.   [name] atorvastatin (LIPITOR) 80 MG tablet TAKE 1 TABLET(80 MG) BY MOUTH DAILY  . clonazePAM (KLONOPIN) 0.5 MG tablet TAKE 1 TABLET BY MOUTH TWICE DAILY AS NEEDED FOR ANXIETY  . fenofibrate 160 MG tablet TAKE 1 TABLET BY MOUTH EVERY DAY  . fluticasone (FLONASE) 50 MCG/ACT nasal spray Place 2 sprays into both nostrils daily. (Patient taking differently: Place 2 sprays into both nostrils daily as needed. )  . glipiZIDE (GLUCOTROL XL) 5 MG 24 hr tablet Take 1 tablet (5 mg total) by mouth daily with breakfast.  . glucose blood test strip Use as instructed to check blood sugar up to three times daily. E11.42  . hydrochlorothiazide (HYDRODIURIL) 25 MG tablet Take 1 tablet (25 mg total) by mouth daily.  [name] lisinopril (PRINIVIL,ZESTRIL) 10 MG tablet TAKE 1 TABLET BY MOUTH DAILY  . Multiple Vitamin (MULTIVITAMIN) capsule Take 1 capsule by mouth daily.  . Omega-3 Fatty Acids (FISH OIL PO) Take by mouth  daily.  . pantoprazole (PROTONIX) 40 MG tablet Take 1 tablet (40 mg total) by mouth 2 (two) times daily.  . psyllium (METAMUCIL) 58.6 % powder Take 1 packet by mouth daily.  . tamsulosin (FLOMAX) 0.4 MG CAPS capsule TAKE 1 CAPSULE(0.4 MG) BY MOUTH DAILY  . [DISCONTINUED] diclofenac sodium (VOLTAREN) 1 % GEL Apply 4 g topically 4 (four) times daily.  . [DISCONTINUED] lidocaine (LIDODERM) 5 % Place 1 patch onto the skin daily. Remove & Discard patch within 12 hours.  . [DISCONTINUED] methocarbamol (ROBAXIN) 500 MG tablet Take 1 tablet (500 mg total) by mouth at bedtime as needed for muscle spasms.  . carvedilol (COREG) 3.125 MG tablet Take 1 tablet (3.125 mg total) by mouth 2 (two) times daily.   No facility-administered encounter medications on file as of 07/04/2017.     Allergies (verified) Azithromycin   History: Past Medical History:  Diagnosis Date  . Aortic insufficiency    a. noted on TTE 2015  . Arthritis   . CAD (coronary artery disease)    a. remote PCI in 1991 and 2005; b. MV 3/15: old inferior MI, no ischemia, LVEF 50%, slight inferior wall hypokniesis  . Chicken pox   . Colon polyps    4 pre-cancerous   . Diverticulitis   . DM type 2 (diabetes mellitus, type 2) ([place])   . Hyperlipidemia   . Hypertension   . Kidney stones   . Melanoma ([place]) 1980  Resected from his back  . Mitral regurgitation    a. noted on TTE 2015  . Myocardial infarction ([place])   . Systolic dysfunction    a. TTE 2015: EF  50-55%, mild global hypokinesis, mild to moderate aortic sclerosis, mild aortic insufficiency, mild to moderate mitral regurgitation, mild tricuspid regurgitation, moderately dilated left atrium, mildly dilated right ventricle, PASP 31 mmHg   Past Surgical History:  Procedure Laterality Date  . CHOLECYSTECTOMY  2012  . COLONOSCOPY    . CORONARY ANGIOPLASTY WITH STENT PLACEMENT  1991 & 2005  . ESOPHAGOGASTRODUODENOSCOPY (EGD) WITH PROPOFOL N/A 11/19/2016   Procedure:  ESOPHAGOGASTRODUODENOSCOPY (EGD) WITH PROPOFOL;  Surgeon: [name], MD;  Location[facility] ENDOSCOPY;  Service: Endoscopy;  Laterality: N/A;  . LITHOTRIPSY  2015  . MELANOMA EXCISION  1980   malignant  . NERVE SURGERY  2015   ulna nerve  . TONSILLECTOMY  1945  . WISDOM TOOTH EXTRACTION     Family History  Problem Relation Age of Onset  . Hyperlipidemia Mother   . Hypertension Mother   . Heart disease Mother   . Diabetes Mother   . Colon cancer Father   . Lung cancer Father   . Kidney cancer Father        malignant capsulated kidney tumor  . Diabetes Father   . Bladder Cancer Neg Hx   . Prostate cancer Neg Hx    Social History   Socioeconomic History  . Marital status: Married    Spouse name: Not on file  . Number of children: Not on file  . Years of education: Not on file  . Highest education level: Not on file  Occupational History  . Not on file  Social Needs  . Financial resource strain: Not hard at all  . Food insecurity:    Worry: Never true    Inability: Never true  . Transportation needs:    Medical: No    Non-medical: No  Tobacco Use  . Smoking status: Current Some Day Smoker    Types: Pipe  . Smokeless tobacco: Former Systems developer    Quit date: 10/17/2015  Substance and Sexual Activity  . Alcohol use: Yes    Alcohol/week: 0.0 - 0.6 oz    Comment: none last 24hrs  . Drug use: No  . Sexual activity: Not on file  Lifestyle  . Physical activity:    Days per week: 2 days    Minutes per session: 60 min  . Stress: Not at all  Relationships  . Social connections:    Talks on phone: Not on file    Gets together: Not on file    Attends religious service: Not on file    Active member of club or organization: Not on file    Attends meetings of clubs or organizations: Not on file    Relationship status: Not on file  Other Topics Concern  . Not on file  Social History Narrative   Married    Tobacco Counseling Ready to quit: Not Answered Counseling given: Not  Answered   Clinical Intake:  Pre-visit preparation completed: Yes  Pain : No/denies pain     Nutritional Status: BMI 25 -29 Overweight Diabetes: Yes(Followed by pcp)  How often do you need to have someone help you when you read instructions, pamphlets, or other written materials from your doctor or pharmacy?: 1 - Never  Interpreter Needed?: No     Activities of Daily Living In your present state of health, do you have  any difficulty performing the following activities: 07/04/2017  Hearing? Y  Comment wears hearing aids  Vision? N  Difficulty concentrating or making decisions? N  Walking or climbing stairs? N  Dressing or bathing? N  Doing errands, shopping? N  Preparing Food and eating ? N  Using the Toilet? N  In the past six months, have you accidently leaked urine? N  Do you have problems with loss of bowel control? N  Managing your Medications? N  Managing your Finances? N  Housekeeping or managing your Housekeeping? N  Some recent data might be hidden     Immunizations and Health Maintenance Immunization History  Administered Date(s) Administered  . Influenza, High Dose Seasonal PF 11/01/2015  . Influenza,inj,Quad PF,6+ Mos 10/08/2016   Health Maintenance Due  Topic Date Due  . FOOT EXAM  01/15/2016  . PNA vac Low Risk Adult (2 of 2 - PPSV23) 01/15/2016    Patient Care Team: [name], [name] as PCP - General (Family Medicine)  Indicate any recent Medical Services you may have received from other than [facility] providers in the past year (date may be approximate).    Assessment:   This is a routine wellness examination for [place].  The goal of the wellness visit is to assist the patient how to close the gaps in care and create a preventative care plan for the patient.   The roster of all physicians providing medical care to patient is listed in the Snapshot section of the chart.  Osteoporosis risk reviewed.    Safety issues reviewed; Smoke and  carbon monoxide detectors in the home. No firearms in the home. Wears seatbelts when driving or riding with others. No violence in the home.  They do not have excessive sun exposure.  Discussed the need for sun protection: hats, long sleeves and the use of sunscreen if there is significant sun exposure.  Patient is alert, normal appearance, oriented to person/place/and time. Correctly identified the president of the [place] and recalls of 3/3 words.Performs simple calculations and can read correct time from watch face. Displays appropriate judgement.  No new identified risk were noted.  No failures at ADL's or IADL's.    BMI- discussed the importance of a healthy diet, water intake and the benefits of aerobic exercise. Educational material provided.   24 hour diet recall: Moderate diet.  Dental- UTD.   Sleep patterns- Sleeps 6 hours at night.  Wakes feeling rested.   Pneumococcal vaccine discussed.  He will verify home record and follow up with pcp.  TDAP vaccine deferred per patient preference.  Follow up with insurance.  Educational material provided.  Patient Concerns: None at this time. Follow up with PCP as needed.  Hearing/Vision screen Hearing Screening Comments: Followed by [facility] Hearing aid, bilateral Vision Screening Comments: Wears corrective lenses Annual visits Visual acuity not assessed per patient preference since they have regular follow up with the ophthalmologist  Dietary issues and exercise activities discussed: Current Exercise Habits: Home exercise routine, Time (Minutes): 60, Frequency (Times/Week): 2, Weekly Exercise (Minutes/Week): 120, Intensity: Mild  Goals    . DIET - INCREASE WATER INTAKE    . Increase physical activity     Walk daily 30 minutes      Depression Screen PHQ 2/9 Scores 05/15/2017 09/09/2016 04/22/2016  PHQ - 2 Score 0 0 0    Fall Risk Fall Risk  05/15/2017 09/09/2016 04/22/2016  Falls in the past year? No No No    Cognitive Function:  6CIT Screen 07/04/2017  What Year? 0 points  What month? 0 points  What time? 0 points  Count back from 20 0 points  Months in reverse 0 points  Repeat phrase 0 points  Total Score 0    Screening Tests Health Maintenance  Topic Date Due  . FOOT EXAM  01/15/2016  . PNA vac Low Risk Adult (2 of 2 - PPSV23) 01/15/2016  . OPHTHALMOLOGY EXAM  07/22/2017  . INFLUENZA VACCINE  08/14/2017  . HEMOGLOBIN A1C  12/11/2017  . TETANUS/TDAP  01/15/2023      Plan:    End of life planning; Advance aging; Advanced directives discussed. Copy of current HCPOA/Living Will requested.    I have personally reviewed and noted the following in the patient's chart:   . Medical and social history . Use of alcohol, tobacco or illicit drugs  . Current medications and supplements . Functional ability and status . Nutritional status . Physical activity . Advanced directives . List of other physicians . Hospitalizations, surgeries, and ER visits in previous 12 months . Vitals . Screenings to include cognitive, depression, and falls . Referrals and appointments  In addition, I have reviewed and discussed with patient certain preventive protocols, quality metrics, and best practice recommendations. A written personalized care plan for preventive services as well as general preventive health recommendations were provided to patient.     Varney Biles, LPN   [id number]    Agree with plan. [name], NP

## 2017-07-06 ENCOUNTER — Other Ambulatory Visit: Payer: Self-pay | Admitting: Family

## 2017-07-11 ENCOUNTER — Ambulatory Visit
Admission: RE | Admit: 2017-07-11 | Discharge: 2017-07-11 | Disposition: A | Discharge status: Home / Self Care | Payer: Medicare Other | Source: Ambulatory Visit | Attending: Urology | Admitting: Urology

## 2017-07-11 ENCOUNTER — Ambulatory Visit (INDEPENDENT_AMBULATORY_CARE_PROVIDER_SITE_OTHER): Payer: Medicare Other | Admitting: Urology

## 2017-07-11 ENCOUNTER — Encounter: Payer: Self-pay | Admitting: Urology

## 2017-07-11 VITALS — BP 137/82 | HR 49 | Ht 72.0 in | Wt 205.7 lb

## 2017-07-11 DIAGNOSIS — N200 Calculus of kidney: Secondary | ICD-10-CM

## 2017-07-11 DIAGNOSIS — I2510 Atherosclerotic heart disease of native coronary artery without angina pectoris: Secondary | ICD-10-CM | POA: Diagnosis not present

## 2017-07-11 DIAGNOSIS — N281 Cyst of kidney, acquired: Secondary | ICD-10-CM

## 2017-07-11 NOTE — Progress Notes (Signed)
07/11/2017 8:51 AM   [name] [name] Aug 21, 1940 [id number]  Referring provider: [name], [name] [address] [place], [place] [address]  Chief Complaint  Patient presents with  . Follow-up    MRI results    HPI:  F/u --   1) nephrolithiasis - lifelong stone former. CT A/P with contrast only in September 2018. There were multiple urological findings. He had multiple large calculi bilaterally. The largest on the right is in the lower pole and is approximately 1.4cm. On the left the largest was approximately 1.8cm in the lower pole. There were also multiple 5-8 mm stones throughout each kidney bilaterally. Hounsfield units were up to 1500. His stones have been calcium oxalate in the past. He has passed multiple stones in the past. He has required surgery 3 times. He has had lithotripsy once and ureteroscopy twice.  2) Bosniak 2 renal cysts - CT Sep 2018 - 1.2 cmright lower pole lesion that is not completely characterized. He also has bilateral up to 7 cm benign simple renal cysts. He therefore underwent an MRI renal protocol for the above renal lesion. He did have a 0.9 cm Bosniak 73F cyst. All othercystswere benign.  3) hematuria - CT and cystoscopy done in 2018. PSA was 0.54 in 2018.   He returns and is doing well.  He underwent repeat MRI scan July 01, 2017 which revealed bilateral cyst.  There was a 3.5 cm proteinaceous cyst, Bosniak 2, and the anterior left lower pole.  This was stable.  There was a 1.4 cm cyst with thin internal septations medial right lower pole which was also stable.  A smaller similar complex cyst with thin internal septation noted in the right midpole.  These are consistent with Bosniak 73F cyst.  MRI recommended in 1 year given stability.  No hydronephrosis.  He was also due for a KUB and this will need to be arranged. He has not passed a stone and had no flank pain. No gross hematuria.    PMH: Past Medical History:  Diagnosis Date  .  Aortic insufficiency    a. noted on TTE 2015  . Arthritis   . CAD (coronary artery disease)    a. remote PCI in 1991 and 2005; b. MV 3/15: old inferior MI, no ischemia, LVEF 50%, slight inferior wall hypokniesis  . Chicken pox   . Colon polyps    4 pre-cancerous   . Diverticulitis   . DM type 2 (diabetes mellitus, type 2) ([place])   . Hyperlipidemia   . Hypertension   . Kidney stones   . Melanoma ([place]) 1980   Resected from his back  . Mitral regurgitation    a. noted on TTE 2015  . Myocardial infarction ([place])   . Systolic dysfunction    a. TTE 2015: EF  50-55%, mild global hypokinesis, mild to moderate aortic sclerosis, mild aortic insufficiency, mild to moderate mitral regurgitation, mild tricuspid regurgitation, moderately dilated left atrium, mildly dilated right ventricle, PASP 31 mmHg    Surgical History: Past Surgical History:  Procedure Laterality Date  . CHOLECYSTECTOMY  2012  . COLONOSCOPY    . CORONARY ANGIOPLASTY WITH STENT PLACEMENT  1991 & 2005  . ESOPHAGOGASTRODUODENOSCOPY (EGD) WITH PROPOFOL N/A 11/19/2016   Procedure: ESOPHAGOGASTRODUODENOSCOPY (EGD) WITH PROPOFOL;  Surgeon: [name], MD;  Location: [facility];  Service: Endoscopy;  Laterality: N/A;  . LITHOTRIPSY  2015  . MELANOMA EXCISION  1980   malignant  . NERVE SURGERY  2015  ulna nerve  . TONSILLECTOMY  1945  . WISDOM TOOTH EXTRACTION      Home Medications:  Allergies as of 07/11/2017      Reactions   Azithromycin       Medication List        Accurate as of 07/11/17  8:51 AM. Always use your most recent med list.          aspirin EC 81 MG tablet Take by mouth daily.   atorvastatin 80 MG tablet Commonly known as:  LIPITOR TAKE 1 TABLET(80 MG) BY MOUTH DAILY   carvedilol 3.125 MG tablet Commonly known as:  COREG Take 1 tablet (3.125 mg total) by mouth 2 (two) times daily.   clonazePAM 0.5 MG tablet Commonly known as:  KLONOPIN TAKE 1 TABLET BY MOUTH TWICE DAILY AS NEEDED FOR  ANXIETY   fenofibrate 160 MG tablet TAKE 1 TABLET BY MOUTH EVERY DAY   FISH OIL PO Take by mouth daily.   fluticasone 50 MCG/ACT nasal spray Commonly known as:  FLONASE Place 2 sprays into both nostrils daily.   glipiZIDE 5 MG 24 hr tablet Commonly known as:  GLUCOTROL XL Take 1 tablet (5 mg total) by mouth daily with breakfast.   glucose blood test strip Use as instructed to check blood sugar up to three times daily. E11.42   hydrochlorothiazide 25 MG tablet Commonly known as:  HYDRODIURIL Take 1 tablet (25 mg total) by mouth daily.   lisinopril 10 MG tablet Commonly known as:  PRINIVIL,ZESTRIL TAKE 1 TABLET BY MOUTH DAILY   multivitamin capsule Take 1 capsule by mouth daily.   pantoprazole 40 MG tablet Commonly known as:  PROTONIX Take 1 tablet (40 mg total) by mouth 2 (two) times daily.   psyllium 58.6 % powder Commonly known as:  METAMUCIL Take 1 packet by mouth daily.   tamsulosin 0.4 MG Caps capsule Commonly known as:  FLOMAX TAKE 1 CAPSULE(0.4 MG) BY MOUTH DAILY       Allergies:  Allergies  Allergen Reactions  . Azithromycin     Family History: Family History  Problem Relation Age of Onset  . Hyperlipidemia Mother   . Hypertension Mother   . Heart disease Mother   . Diabetes Mother   . Colon cancer Father   . Lung cancer Father   . Kidney cancer Father        malignant capsulated kidney tumor  . Diabetes Father   . Bladder Cancer Neg Hx   . Prostate cancer Neg Hx     Social History:  reports that he has been smoking pipe.  He quit smokeless tobacco use about 20 months ago. He reports that he drinks alcohol. He reports that he does not use drugs.  ROS: UROLOGY Frequent Urination?: No Hard to postpone urination?: No Burning/pain with urination?: No Get up at night to urinate?: Yes Leakage of urine?: No Urine stream starts and stops?: No Trouble starting stream?: No Do you have to strain to urinate?: No Blood in urine?: No Urinary  tract infection?: No Sexually transmitted disease?: No Injury to kidneys or bladder?: No Painful intercourse?: No Weak stream?: No Erection problems?: No Penile pain?: No  Gastrointestinal Nausea?: No Vomiting?: No Indigestion/heartburn?: No Diarrhea?: No Constipation?: No  Constitutional Fever: No Night sweats?: No Weight loss?: No Fatigue?: No  Skin Skin rash/lesions?: No Itching?: No  Eyes Blurred vision?: No Double vision?: No  Ears/Nose/Throat Sore throat?: No Sinus problems?: No  Hematologic/Lymphatic Swollen glands?: No Easy bruising?: No  Cardiovascular Leg swelling?: No Chest pain?: No  Respiratory Cough?: Yes Shortness of breath?: No  Endocrine Excessive thirst?: No  Musculoskeletal Back pain?: No Joint pain?: No  Neurological Headaches?: No Dizziness?: No  Psychologic Depression?: No Anxiety?: No  Physical Exam: BP 137/82 (BP Location: Right Arm, Patient Position: Sitting, Cuff Size: Normal)   Pulse (!) 49   Ht 6' (1.829 m)   Wt 93.3 kg (205 lb 11.2 oz)   BMI 27.90 kg/m   Constitutional:  Alert and oriented, No acute distress. HEENT: [place] AT, moist mucus membranes.  Trachea midline, no masses. Cardiovascular: No clubbing, cyanosis, or edema. Respiratory: Normal respiratory effort, no increased work of breathing. GI: Abdomen is soft, nontender, nondistended, no abdominal masses GU: No CVA tenderness Skin: No rashes, bruises or suspicious lesions. Neurologic: Grossly intact, no focal deficits, moving all 4 extremities. Psychiatric: Normal mood and affect.  Laboratory Data: Lab Results  Component Value Date   WBC 7.1 01/08/2017   HGB 14.9 01/08/2017   HCT 44.0 01/08/2017   MCV 88.2 01/08/2017   PLT 178 01/08/2017    Lab Results  Component Value Date   CREATININE 0.80 07/01/2017    Lab Results  Component Value Date   PSA 0.54 10/31/2016    No results found for: TESTOSTERONE  Lab Results  Component Value Date    HGBA1C 6.9 (H) 06/10/2017    Urinalysis    Component Value Date/Time   COLORURINE YELLOW 01/08/2017 1005   APPEARANCEUR CLEAR 01/08/2017 1005   APPEARANCEUR Clear 10/16/2016 1030   LABSPEC 1.015 01/08/2017 1005   PHURINE 6.5 01/08/2017 1005   GLUCOSEU NEGATIVE 01/08/2017 1005   HGBUR NEGATIVE 01/08/2017 1005   BILIRUBINUR NEGATIVE 01/08/2017 1005   BILIRUBINUR Negative 10/16/2016 1030   KETONESUR NEGATIVE 01/08/2017 1005   PROTEINUR Negative 10/16/2016 1030   UROBILINOGEN 0.2 01/08/2017 1005   NITRITE NEGATIVE 01/08/2017 1005   LEUKOCYTESUR NEGATIVE 01/08/2017 1005   LEUKOCYTESUR Trace (A) 10/16/2016 1030    Lab Results  Component Value Date   LABMICR See below: 10/16/2016   WBCUA 0-5 10/16/2016   RBCUA 3-10 (A) 10/16/2016   LABEPIT None seen 10/16/2016   MUCUS Presence of (A) 01/08/2017   BACTERIA Few (A) 10/16/2016    Pertinent Imaging: MRI June 2019, MRI June 2018, No results found for this or any previous visit. No results found for this or any previous visit. No results found for this or any previous visit. No results found for this or any previous visit. Results for orders placed during the hospital encounter of 04/07/17  US RENAL   Narrative CLINICAL DATA:  Cyst follow-up  EXAM: RENAL / URINARY TRACT ULTRASOUND COMPLETE  COMPARISON:  MRI 10/04/2016, CT 09/24/2016, ultrasound 08/07/2015  FINDINGS: Right Kidney:  Length: 11 cm. Echogenicity within normal limits. No hydronephrosis. Multiple shadowing stones within the right kidney. A midpole shadowing focus measures 1.2 cm. Multiple cysts in the right kidney. A dominant cyst in the upper pole measures 6.7 x 6.9 x 6.7 cm. A lower pole cyst, possibly corresponding to the MRI finding measure 0.9 x 0.9 x 1.1 cm and contains internal septation.  Left Kidney:  Length: 12.7 cm. Echogenicity within normal limits. Shadowing stones are present. In the mid to lower pole is a 1 cm shadowing stone. Multiple cysts  in the left kidney. Dominant lesion in the lower pole measures 7.7 x 6.6 x 7.6 cm.  Bladder:  Appears normal for degree of bladder distention.  IMPRESSION: 1. Multiple bilateral renal cysts. A  complex cyst in the lower pole of the right kidney possibly corresponding to the MRI finding measures 1.1 cm maximally compared with 0.9 cm on the MRI. Follow-up MRI for this lesion was recommended 10/04/2016 and should again be considered 2. Bilateral intrarenal calculi.  No hydronephrosis.   Electronically Signed   By: Donavan Foil M.D.   On: 04/07/2017 22:07    No results found for this or any previous visit. No results found for this or any previous visit. No results found for this or any previous visit.  Assessment & Plan:    1) Bosniak 2 and 41F renal cyst- plan MRI June 2020.  I drew patient a picture of the Bosniak scale and discussed the risk of malignancy with each progressing level of complexity.    2) nephrolithiasis-KUB ordered      No follow-ups on file.  Festus Aloe, MD  [facility] [address] [place], [place] [address] [phone]

## 2017-07-14 ENCOUNTER — Telehealth: Payer: Self-pay

## 2017-07-14 NOTE — Telephone Encounter (Signed)
Called pt informed him of information below. Pt gave verbal understanding.  

## 2017-07-14 NOTE — Telephone Encounter (Signed)
-----   Message from Festus Aloe, MD sent at 07/11/2017  5:13 PM EDT ----- Notify patient the stones in his kidneys look stable. No change. No stones are passing or moving. Will follow.   ----- Message ----- From: Gordy Clement, [place]: 07/11/2017   4:04 PM To: Festus Aloe, MD    ----- Message ----- From: Interface, Rad Results In Sent: 07/11/2017   3:07 PM To: Rowe Robert Clinical

## 2017-07-15 DIAGNOSIS — M542 Cervicalgia: Secondary | ICD-10-CM | POA: Diagnosis not present

## 2017-07-16 ENCOUNTER — Other Ambulatory Visit: Payer: Self-pay | Admitting: Family

## 2017-07-27 ENCOUNTER — Other Ambulatory Visit: Payer: Self-pay | Admitting: Family

## 2017-07-27 DIAGNOSIS — G8929 Other chronic pain: Secondary | ICD-10-CM

## 2017-07-27 DIAGNOSIS — M545 Low back pain: Principal | ICD-10-CM

## 2017-07-29 ENCOUNTER — Telehealth: Payer: Self-pay

## 2017-07-29 NOTE — Telephone Encounter (Signed)
Patient returning call.

## 2017-07-29 NOTE — Telephone Encounter (Signed)
Left message to call regarding refill request.  Last office visit medication was discontinued.

## 2017-07-29 NOTE — Telephone Encounter (Signed)
Last refill 06/14/17 Patient states taking for back pain , helps to relieve pain Patient ok to wait until Monday when [name] gets back in office

## 2017-07-29 NOTE — Telephone Encounter (Signed)
-----   Message from Festus Aloe, MD sent at 07/28/2017  4:54 PM EDT ----- Notify patient the stones in his kidneys look stable. No change. No stones are passing or moving. Will follow.   ----- Message ----- From: Gordy Clement, [place] Sent: 07/14/2017  11:14 AM To: Festus Aloe, MD    ----- Message ----- From: Festus Aloe, MD Sent: 07/11/2017   5:13 PM To: Gordy Clement, [place]  Notify patient the stones in his kidneys look stable. No change. No stones are passing or moving. Will follow.   ----- Message ----- From: Gordy Clement, [place]: 07/11/2017   4:04 PM To: Festus Aloe, MD    ----- Message ----- From: Interface, Rad Results In Sent: 07/11/2017   3:07 PM To: Rowe Robert Clinical

## 2017-07-30 ENCOUNTER — Other Ambulatory Visit: Payer: Self-pay

## 2017-07-30 DIAGNOSIS — E118 Type 2 diabetes mellitus with unspecified complications: Secondary | ICD-10-CM

## 2017-07-30 MED ORDER — GLIPIZIDE ER 5 MG PO TB24: 5.0000 mg | ORAL_TABLET | Freq: Every day | ORAL | 1 refills | Status: DC

## 2017-07-30 NOTE — Telephone Encounter (Signed)
Please advise 

## 2017-08-04 NOTE — Telephone Encounter (Signed)
Call pt Confused as looks like refilled on 07/26/2017 to [facility].? Does he have? We also have a request from [facility]? So where does he want prescriptions sent?   Please ask if he is still using robaxin for back pain. If no, please remove from med list. Advise to limit use of NSAIDs ( ibuprofen etc) due to HTN.

## 2017-08-05 ENCOUNTER — Telehealth: Payer: Self-pay | Admitting: Family

## 2017-08-05 NOTE — Telephone Encounter (Signed)
Copied from [place] [phone]. Topic: Quick Communication - See Telephone Encounter >> Aug 05, 2017 11:07 AM Burchel, Abbi R wrote: CRM for notification. See Telephone encounter for: 08/05/17.  Pt states he is returning Kristen's call.  Please call pt at: [phone]

## 2017-08-05 NOTE — Telephone Encounter (Signed)
Left voice mail for patient to call back ok for P[facility] to speak to patient    

## 2017-08-05 NOTE — Telephone Encounter (Signed)
Tried calling patient unavailable at this time.

## 2017-08-06 NOTE — Telephone Encounter (Signed)
Pt calling back please advise. [phone] Asking question about medication.

## 2017-08-06 NOTE — Telephone Encounter (Signed)
Spoke to patient he states his wife may have picked up script at [facility] when she picked up her scripts .   Will call me back and let me know .  He is no longer taking Robaxin. Will limit use of NSAIDs.

## 2017-08-07 NOTE — Telephone Encounter (Signed)
Spoke with patient he states script was filled at [facility] on 07/26/17

## 2017-08-07 NOTE — Telephone Encounter (Signed)
Patient returned call

## 2017-08-07 NOTE — Telephone Encounter (Signed)
Spoke to patient script he states it was filled at [facility] on 07/26/17

## 2017-08-20 ENCOUNTER — Telehealth: Payer: Self-pay | Admitting: Gastroenterology

## 2017-08-20 ENCOUNTER — Other Ambulatory Visit: Payer: Self-pay

## 2017-08-20 MED ORDER — PANTOPRAZOLE SODIUM 40 MG PO TBEC: 40.0000 mg | DELAYED_RELEASE_TABLET | Freq: Two times a day (BID) | ORAL | 6 refills | Status: DC

## 2017-08-20 NOTE — Telephone Encounter (Signed)
Pt notified a new rx has been sent to [facility] per his request.

## 2017-08-20 NOTE — Telephone Encounter (Signed)
Patient calling wanting to get medication pantoprazole changed from 30 tablet quantity to 60 tablet due to the fact that he takes 2 a day and does not want to refill every couple weeks.

## 2017-08-21 ENCOUNTER — Other Ambulatory Visit: Payer: Self-pay | Admitting: Family

## 2017-09-05 ENCOUNTER — Other Ambulatory Visit: Payer: Self-pay | Admitting: Family Medicine

## 2017-09-05 ENCOUNTER — Telehealth: Payer: Self-pay | Admitting: Family

## 2017-09-08 NOTE — Telephone Encounter (Signed)
Call pt Please ask him to call urology about flomax refill  I refilled tramadol  I looked up patient on [place] Controlled Substances Reporting System and saw no activity that raised concern of inappropriate use.

## 2017-09-08 NOTE — Telephone Encounter (Signed)
Last filled 07/26/17 Next office visit 12/24/17 Last office visit 06/18/17

## 2017-09-11 NOTE — Telephone Encounter (Signed)
Patient instructed to contact urology for refill

## 2017-09-11 NOTE — Telephone Encounter (Signed)
Pt was wondering why his medication of tamsulosin (FLOMAX) 0.4 MG CAPS capsule I read to pt about needing to contact the urologist,but pt states [name] name is on the bottle. Please advise? Thank you!  Call pt @ [phone].

## 2017-09-12 ENCOUNTER — Telehealth: Payer: Self-pay | Admitting: Urology

## 2017-09-12 NOTE — Telephone Encounter (Signed)
Pt needs refill for Tasmsulosin 0.4 mg sent to [facility] on [address].  Please call pt when this is done.  He has about 7 capsules left.  Please call pt when this is done [phone]

## 2017-09-12 NOTE — Telephone Encounter (Signed)
Okay to prescribe? I see where pt was given this medication by a different provider back in June. Please advise

## 2017-09-16 MED ORDER — TAMSULOSIN HCL 0.4 MG PO CAPS: ORAL_CAPSULE | ORAL | 0 refills | Status: DC

## 2017-09-16 NOTE — Telephone Encounter (Signed)
Yes, can refill flomax

## 2017-09-16 NOTE — Addendum Note (Signed)
Addended by: Garnette Gunner on: 09/16/2017 02:37 PM   Modules accepted: Orders

## 2017-09-27 ENCOUNTER — Other Ambulatory Visit: Payer: Self-pay | Admitting: Family

## 2017-10-19 ENCOUNTER — Other Ambulatory Visit: Payer: Self-pay | Admitting: Family

## 2017-10-20 DIAGNOSIS — Z23 Encounter for immunization: Secondary | ICD-10-CM | POA: Diagnosis not present

## 2017-10-21 ENCOUNTER — Other Ambulatory Visit: Payer: Self-pay | Admitting: Family Medicine

## 2017-10-21 MED ORDER — TAMSULOSIN HCL 0.4 MG PO CAPS: ORAL_CAPSULE | ORAL | 11 refills | Status: DC

## 2017-10-22 ENCOUNTER — Ambulatory Visit (INDEPENDENT_AMBULATORY_CARE_PROVIDER_SITE_OTHER): Payer: Medicare Other | Admitting: Family

## 2017-10-22 ENCOUNTER — Encounter: Payer: Self-pay | Admitting: Family

## 2017-10-22 ENCOUNTER — Ambulatory Visit (INDEPENDENT_AMBULATORY_CARE_PROVIDER_SITE_OTHER): Payer: Medicare Other

## 2017-10-22 VITALS — BP 150/90 | HR 60 | Temp 98.4°F | Resp 15 | Ht 72.0 in | Wt 207.0 lb

## 2017-10-22 DIAGNOSIS — G4700 Insomnia, unspecified: Secondary | ICD-10-CM | POA: Diagnosis not present

## 2017-10-22 DIAGNOSIS — R1030 Lower abdominal pain, unspecified: Secondary | ICD-10-CM | POA: Diagnosis not present

## 2017-10-22 DIAGNOSIS — G8929 Other chronic pain: Secondary | ICD-10-CM | POA: Diagnosis not present

## 2017-10-22 DIAGNOSIS — M4807 Spinal stenosis, lumbosacral region: Secondary | ICD-10-CM | POA: Diagnosis not present

## 2017-10-22 DIAGNOSIS — M545 Low back pain, unspecified: Secondary | ICD-10-CM

## 2017-10-22 DIAGNOSIS — I2510 Atherosclerotic heart disease of native coronary artery without angina pectoris: Secondary | ICD-10-CM

## 2017-10-22 DIAGNOSIS — M5137 Other intervertebral disc degeneration, lumbosacral region: Secondary | ICD-10-CM | POA: Diagnosis not present

## 2017-10-22 DIAGNOSIS — N200 Calculus of kidney: Secondary | ICD-10-CM | POA: Diagnosis not present

## 2017-10-22 LAB — POC URINALSYSI DIPSTICK (AUTOMATED)
Bilirubin, UA: NEGATIVE
Glucose, UA: NEGATIVE
Ketones, UA: NEGATIVE
Nitrite, UA: NEGATIVE
Protein, UA: NEGATIVE
Spec Grav, UA: 1.015 (ref 1.010–1.025)
Urobilinogen, UA: 0.2 E.U./dL
pH, UA: 6.5 (ref 5.0–8.0)

## 2017-10-22 LAB — URINALYSIS, MICROSCOPIC ONLY

## 2017-10-22 NOTE — Assessment & Plan Note (Addendum)
Reassuring exam.  Patient is not toxic in appearance.   Acute on chronic low back pain. Differential pain include degenerative disc disease as seen on lumbar x-ray today. possible constipation contributing as well.  Does not appear patient has an acute renal process including renal stone seen on abdominal film.  However blood seen in urine dip; pending microscope, urine culture to ensure no infection.  I am considering that more of the etiology of the pain may be chronic, degenerative in nature versus acute renal stone.  Advised patient if he has any new, or worsening pain, symptoms, please let me know is that I would recommend that we pursue a CT abdomen and pelvis. I have also asked patient if he would like to see orthopedics or pursue MRI brain.

## 2017-10-22 NOTE — Assessment & Plan Note (Signed)
Largely stable.  Patient decreased Klonopin due to concerns of cognitive decline which we discussed in further detail today. I am in agreement of decreasing.  He reports waking up now.  Discussed with him that I would discuss with pharmacist safer options to treat his insomnia since he is on tramadol.  We discussed patient follow-up

## 2017-10-22 NOTE — Patient Instructions (Addendum)
If you do not hear from regarding options for anxiety, let me know. I have consulted our pharmacist.    Please make follow up to discuss anxiety with me  Please stay hypervigilant as etiology for pain is nonspecific at this time. We are pending xrays, urine studies  Let me know of any new symptoms, concerns

## 2017-10-22 NOTE — Telephone Encounter (Signed)
Patient advised of below and verbalized understanding. He states he is already taking fiber, will increase water intake.   He wants to hold off on Miralax and colace for now due to he is having regular bowel movements daily

## 2017-10-22 NOTE — Progress Notes (Signed)
Subjective:    Patient ID: [name] [name], male    DOB: 03-26-1940, 77 y.o.   MRN: [id number]  CC: [name] [name] is a 77 y.o. male who presents today for an acute visit.    HPI: OX:[id number] midline low to right back pain 2 weeks ago and then noted radiates to low suprabic 'bladder' x 2 weeks. Pain is better in the mornings. Not sure if related to arthritis. Sits in recliner most of the day. Tyelonol arthritis has helped a great deal.   Neuropathy in soles of feet. Occasional has pain radiates right lateral thigh. No trouble voiding. Not exercising when weather was hot.   Couple of 2 episodes of diarrhea 2 weeks ago, non bloody.  Since then , bowel movements are soft.    If rests by leaning forward with elbows on knees, back pain improves.   H/o renal stone  insomnia- has decreased klonopin( to half tablet) on his own due to concerns for long term effects. Finds that he is waking up at 5am. On tramadol BID. No depression.   On protonix after EGD. Reports has never had symptoms for reflux. No belching, epigastric burning.   H/o cholecystectomy  Colonoscopy 2017- polyps, diverticulosis.  07/2015- Lumbar XR 08/2017- MR Abdomen- Dr Pilar Jarvis Renal cysts - left and right . No solid renal mass or hydronephrosis.  HISTORY:  Past Medical History:  Diagnosis Date  . Aortic insufficiency    a. noted on TTE 2015  . Arthritis   . CAD (coronary artery disease)    a. remote PCI in 1991 and 2005; b. MV 3/15: old inferior MI, no ischemia, LVEF 50%, slight inferior wall hypokniesis  . Chicken pox   . Colon polyps    4 pre-cancerous   . Diverticulitis   . DM type 2 (diabetes mellitus, type 2) ([place])   . Hyperlipidemia   . Hypertension   . Kidney stones   . Melanoma ([place]) 1980   Resected from his back  . Mitral regurgitation    a. noted on TTE 2015  . Myocardial infarction ([place])   . Systolic dysfunction    a. TTE 2015: EF  50-55%, mild global hypokinesis, mild to moderate aortic  sclerosis, mild aortic insufficiency, mild to moderate mitral regurgitation, mild tricuspid regurgitation, moderately dilated left atrium, mildly dilated right ventricle, PASP 31 mmHg   Past Surgical History:  Procedure Laterality Date  . CHOLECYSTECTOMY  2012  . COLONOSCOPY    . CORONARY ANGIOPLASTY WITH STENT PLACEMENT  1991 & 2005  . ESOPHAGOGASTRODUODENOSCOPY (EGD) WITH PROPOFOL N/A 11/19/2016   Procedure: ESOPHAGOGASTRODUODENOSCOPY (EGD) WITH PROPOFOL;  Surgeon: [name], MD;  Location: [facility] ENDOSCOPY;  Service: Endoscopy;  Laterality: N/A;  . LITHOTRIPSY  2015  . MELANOMA EXCISION  1980   malignant  . NERVE SURGERY  2015   ulna nerve  . TONSILLECTOMY  1945  . WISDOM TOOTH EXTRACTION     Family History  Problem Relation Age of Onset  . Hyperlipidemia Mother   . Hypertension Mother   . Heart disease Mother   . Diabetes Mother   . Colon cancer Father   . Lung cancer Father   . Kidney cancer Father        malignant capsulated kidney tumor  . Diabetes Father   . Bladder Cancer Neg Hx   . Prostate cancer Neg Hx     Allergies: Azithromycin Current Outpatient Medications on File Prior to Visit  Medication Sig Dispense Refill  . acetaminophen (TYLENOL) 650 MG  CR tablet Take 650 mg by mouth every 8 (eight) hours as needed for pain.    [name] aspirin EC 81 MG tablet Take by mouth daily.     [name] atorvastatin (LIPITOR) 80 MG tablet TAKE 1 TABLET(80 MG) BY MOUTH DAILY 90 tablet 1  . clonazePAM (KLONOPIN) 0.5 MG tablet TAKE 1 TABLET BY MOUTH TWICE DAILY AS NEEDED FOR ANXIETY 60 tablet 1  . CONTOUR TEST test strip USE AS DIRECTED 100 each 0  . fenofibrate 160 MG tablet TAKE 1 TABLET BY MOUTH EVERY DAY 90 tablet 1  . fluticasone (FLONASE) 50 MCG/ACT nasal spray Place 2 sprays into both nostrils daily. (Patient taking differently: Place 2 sprays into both nostrils daily as needed. ) 16 g 6  . glipiZIDE (GLUCOTROL XL) 5 MG 24 hr tablet Take 1 tablet (5 mg total) by mouth daily with  breakfast. 90 tablet 1  . glucose blood test strip Use as instructed to check blood sugar up to three times daily. E11.42 100 each 12  . hydrochlorothiazide (HYDRODIURIL) 25 MG tablet TAKE 1 TABLET(25 MG) BY MOUTH DAILY 90 tablet 0  . lisinopril (PRINIVIL,ZESTRIL) 10 MG tablet TAKE 1 TABLET BY MOUTH DAILY 90 tablet 1  . Multiple Vitamin (MULTIVITAMIN) capsule Take 1 capsule by mouth daily.    . Omega-3 Fatty Acids (FISH OIL PO) Take by mouth daily.    . pantoprazole (PROTONIX) 40 MG tablet Take 1 tablet (40 mg total) by mouth 2 (two) times daily. 60 tablet 6  . psyllium (METAMUCIL) 58.6 % powder Take 1 packet by mouth daily.    . tamsulosin (FLOMAX) 0.4 MG CAPS capsule TAKE 1 CAPSULE(0.4 MG) BY MOUTH DAILY 90 capsule 11  . traMADol (ULTRAM) 50 MG tablet TAKE 1 TABLET( 50 MG TOTAL) BY MOUTH EVERY 8 HOURS AS NEEDED 90 tablet 1  . carvedilol (COREG) 3.125 MG tablet Take 1 tablet (3.125 mg total) by mouth 2 (two) times daily. 180 tablet 3   No current facility-administered medications on file prior to visit.     Social History   Tobacco Use  . Smoking status: Current Some Day Smoker    Types: Pipe  . Smokeless tobacco: Former Systems developer    Quit date: 10/17/2015  Substance Use Topics  . Alcohol use: Yes    Alcohol/week: 0.0 - 1.0 standard drinks    Comment: none last 24hrs  . Drug use: No    Review of Systems  Constitutional: Negative for chills and fever.  Eyes: Negative for visual disturbance.  Respiratory: Negative for cough.   Cardiovascular: Negative for chest pain and palpitations.  Gastrointestinal: Positive for abdominal pain and diarrhea (resolved). Negative for blood in stool, constipation, nausea and vomiting.  Musculoskeletal: Positive for arthralgias (low back and neck pain) and neck pain.  Skin: Negative for rash.  Neurological: Positive for numbness (BL soles feet). Negative for dizziness and headaches.  Psychiatric/Behavioral: Positive for sleep disturbance.        Objective:    BP (!) 150/90 (BP Location: Left Arm, Patient Position: Sitting, Cuff Size: Normal)   Pulse 60   Temp 98.4 F (36.9 C) (Oral)   Resp 15   Ht 6' (1.829 m)   Wt 207 lb (93.9 kg)   SpO2 96%   BMI 28.07 kg/m    Physical Exam  Constitutional: He appears well-developed and well-nourished.  Cardiovascular: Regular rhythm and normal heart sounds.  Pulmonary/Chest: Effort normal and breath sounds normal. No respiratory distress. He has no wheezes. He has no  rhonchi. He has no rales.  Abdominal: Soft. Normal appearance, normal aorta and bowel sounds are normal. He exhibits no distension, no fluid wave, no ascites and no mass. There is no tenderness. There is no rigidity, no rebound, no guarding, no CVA tenderness, no tenderness at McBurney's point and negative [name]'s sign.  No suprapubic tenderness.  Obese  Musculoskeletal:       Lumbar back: He exhibits tenderness. He exhibits normal range of motion, no swelling, no pain and no spasm.       Back:  Full range of motion with flexion, extension, lateral side bends. No pain, numbness, tingling elicited with single leg raise bilaterally. No rash. No pain appreciated bilateral trochanter   Lymphadenopathy:       [name] (left side): No submandibular and no preauricular adenopathy present.  Neurological: He is alert.  Sensation intact BLE  Skin: Skin is warm and dry.  Psychiatric: He has a normal mood and affect. His speech is normal and behavior is normal.  Vitals reviewed.      Assessment & Plan:   Problem List Items Addressed This Visit      Other   Chronic low back pain    Reassuring exam.  Patient is not toxic in appearance.   Acute on chronic low back pain. Differential pain include degenerative disc disease as seen on lumbar x-ray today. possible constipation contributing as well.  Does not appear patient has an acute renal process including renal stone seen on abdominal film.  However blood seen in urine dip;  pending microscope, urine culture to ensure no infection.  I am considering that more of the etiology of the pain may be chronic, degenerative in nature versus acute renal stone.  Advised patient if he has any new, or worsening pain, symptoms, please let me know is that I would recommend that we pursue a CT abdomen and pelvis. I have also asked patient if he would like to see orthopedics or pursue MRI brain.        Relevant Medications   acetaminophen (TYLENOL) 650 MG CR tablet   Insomnia    Largely stable.  Patient decreased Klonopin due to concerns of cognitive decline which we discussed in further detail today. I am in agreement of decreasing.  He reports waking up now.  Discussed with him that I would discuss with pharmacist safer options to treat his insomnia since he is on tramadol.  We discussed patient follow-up      Abdominal pain - Primary   Relevant Orders   POCT Urinalysis Dipstick (Automated) (Completed)   Urine Microscopic   CULTURE, URINE COMPREHENSIVE   DG Abd 1 View (Completed)   DG Lumbar Spine Complete (Completed)         I am having [name] [name] "[name] [name]" maintain his aspirin EC, fluticasone, Omega-3 Fatty Acids (FISH OIL PO), multivitamin, psyllium, carvedilol, fenofibrate, glucose blood, clonazePAM, glipiZIDE, pantoprazole, CONTOUR TEST, traMADol, hydrochlorothiazide, atorvastatin, lisinopril, tamsulosin, and acetaminophen.   No orders of the defined types were placed in this encounter.   Return precautions given.   Risks, benefits, and alternatives of the medications and treatment plan prescribed today were discussed, and patient expressed understanding.   Education regarding symptom management and diagnosis given to patient on AVS.  Continue to follow with [name], [name] for routine health maintenance.   [name] [name] and I agreed with plan.   [name], [name]

## 2017-10-24 ENCOUNTER — Other Ambulatory Visit: Payer: Self-pay | Admitting: Family

## 2017-10-24 DIAGNOSIS — R319 Hematuria, unspecified: Secondary | ICD-10-CM

## 2017-10-24 LAB — CULTURE, URINE COMPREHENSIVE
MICRO NUMBER:: 91214314
RESULT:: NO GROWTH
SPECIMEN QUALITY:: ADEQUATE

## 2017-10-29 ENCOUNTER — Encounter: Payer: Self-pay | Admitting: Family

## 2017-11-14 ENCOUNTER — Other Ambulatory Visit (INDEPENDENT_AMBULATORY_CARE_PROVIDER_SITE_OTHER): Payer: Medicare Other

## 2017-11-14 DIAGNOSIS — R319 Hematuria, unspecified: Secondary | ICD-10-CM | POA: Diagnosis not present

## 2017-11-14 LAB — URINALYSIS, MICROSCOPIC ONLY

## 2017-11-15 ENCOUNTER — Encounter: Payer: Self-pay | Admitting: Family

## 2017-11-15 ENCOUNTER — Other Ambulatory Visit: Payer: Self-pay | Admitting: Family

## 2017-11-26 ENCOUNTER — Encounter: Payer: Self-pay | Admitting: Family

## 2017-11-26 ENCOUNTER — Other Ambulatory Visit: Payer: Self-pay | Admitting: Family

## 2017-11-26 DIAGNOSIS — R319 Hematuria, unspecified: Secondary | ICD-10-CM

## 2017-11-29 ENCOUNTER — Other Ambulatory Visit: Payer: Self-pay | Admitting: Family

## 2017-11-29 DIAGNOSIS — G4700 Insomnia, unspecified: Secondary | ICD-10-CM

## 2017-12-01 NOTE — Telephone Encounter (Signed)
Call pt  Is he still on klonopin?   I have a refill request and thought he had come off

## 2017-12-01 NOTE — Telephone Encounter (Signed)
Noted rx sent.

## 2017-12-01 NOTE — Telephone Encounter (Signed)
Spoke to patient he is tapering himself off of clonazepam . Current dose he  is taking 1/2 tablet as needed , then he plans on going down to 1/4 of a tablet. He states he believe he will only need a 30 day supply.

## 2017-12-08 ENCOUNTER — Other Ambulatory Visit: Payer: Self-pay | Admitting: Family

## 2017-12-08 NOTE — Telephone Encounter (Signed)
I looked up patient on [place] Controlled Substances Reporting System and saw no activity that raised concern of inappropriate use.   

## 2017-12-16 DIAGNOSIS — L820 Inflamed seborrheic keratosis: Secondary | ICD-10-CM | POA: Diagnosis not present

## 2017-12-16 DIAGNOSIS — Z85828 Personal history of other malignant neoplasm of skin: Secondary | ICD-10-CM | POA: Diagnosis not present

## 2017-12-16 DIAGNOSIS — D485 Neoplasm of uncertain behavior of skin: Secondary | ICD-10-CM | POA: Diagnosis not present

## 2017-12-16 DIAGNOSIS — L570 Actinic keratosis: Secondary | ICD-10-CM | POA: Diagnosis not present

## 2017-12-16 DIAGNOSIS — Z08 Encounter for follow-up examination after completed treatment for malignant neoplasm: Secondary | ICD-10-CM | POA: Diagnosis not present

## 2017-12-16 DIAGNOSIS — X32XXXA Exposure to sunlight, initial encounter: Secondary | ICD-10-CM | POA: Diagnosis not present

## 2017-12-16 DIAGNOSIS — Z85820 Personal history of malignant melanoma of skin: Secondary | ICD-10-CM | POA: Diagnosis not present

## 2017-12-16 DIAGNOSIS — L538 Other specified erythematous conditions: Secondary | ICD-10-CM | POA: Diagnosis not present

## 2017-12-22 ENCOUNTER — Emergency Department
Admission: EM | Admit: 2017-12-22 | Discharge: 2017-12-22 | Disposition: A | Discharge status: Home / Self Care | Payer: Medicare Other | Attending: Emergency Medicine | Admitting: Emergency Medicine

## 2017-12-22 ENCOUNTER — Encounter: Payer: Self-pay | Admitting: Emergency Medicine

## 2017-12-22 ENCOUNTER — Emergency Department: Payer: Medicare Other

## 2017-12-22 ENCOUNTER — Other Ambulatory Visit (INDEPENDENT_AMBULATORY_CARE_PROVIDER_SITE_OTHER): Payer: Medicare Other

## 2017-12-22 ENCOUNTER — Other Ambulatory Visit: Payer: Self-pay

## 2017-12-22 DIAGNOSIS — F17290 Nicotine dependence, other tobacco product, uncomplicated: Secondary | ICD-10-CM | POA: Diagnosis not present

## 2017-12-22 DIAGNOSIS — R1031 Right lower quadrant pain: Secondary | ICD-10-CM | POA: Diagnosis present

## 2017-12-22 DIAGNOSIS — N201 Calculus of ureter: Secondary | ICD-10-CM | POA: Diagnosis not present

## 2017-12-22 DIAGNOSIS — R109 Unspecified abdominal pain: Secondary | ICD-10-CM | POA: Diagnosis not present

## 2017-12-22 DIAGNOSIS — N200 Calculus of kidney: Secondary | ICD-10-CM | POA: Diagnosis not present

## 2017-12-22 DIAGNOSIS — I10 Essential (primary) hypertension: Secondary | ICD-10-CM | POA: Insufficient documentation

## 2017-12-22 DIAGNOSIS — Z79899 Other long term (current) drug therapy: Secondary | ICD-10-CM | POA: Diagnosis not present

## 2017-12-22 DIAGNOSIS — R319 Hematuria, unspecified: Secondary | ICD-10-CM | POA: Diagnosis not present

## 2017-12-22 DIAGNOSIS — I259 Chronic ischemic heart disease, unspecified: Secondary | ICD-10-CM | POA: Insufficient documentation

## 2017-12-22 DIAGNOSIS — Z7982 Long term (current) use of aspirin: Secondary | ICD-10-CM | POA: Insufficient documentation

## 2017-12-22 DIAGNOSIS — E119 Type 2 diabetes mellitus without complications: Secondary | ICD-10-CM | POA: Insufficient documentation

## 2017-12-22 LAB — BASIC METABOLIC PANEL
Anion gap: 6 (ref 5–15)
BUN: 24 mg/dL — ABNORMAL HIGH (ref 8–23)
CO2: 25 mmol/L (ref 22–32)
Calcium: 9.3 mg/dL (ref 8.9–10.3)
Chloride: 107 mmol/L (ref 98–111)
Creatinine, Ser: 1.07 mg/dL (ref 0.61–1.24)
GFR calc Af Amer: >60 mL/min (ref >60)
GFR calc non Af Amer: >60 mL/min (ref >60)
Glucose, Bld: 163 mg/dL — ABNORMAL HIGH (ref 70–99)
Potassium: 3.4 mmol/L — ABNORMAL LOW (ref 3.5–5.1)
Sodium: 138 mmol/L (ref 135–145)

## 2017-12-22 LAB — URINALYSIS, COMPLETE (UACMP) WITH MICROSCOPIC
Bacteria, UA: NONE SEEN
Bilirubin Urine: NEGATIVE
Glucose, UA: NEGATIVE mg/dL
Ketones, ur: NEGATIVE mg/dL
Nitrite: NEGATIVE
Protein, ur: NEGATIVE mg/dL
Specific Gravity, Urine: 1.013 (ref 1.005–1.030)
Squamous Epithelial / HPF: NONE SEEN (ref 0–5)
pH: 6 (ref 5.0–8.0)

## 2017-12-22 LAB — CBC
HCT: 42.6 % (ref 39.0–52.0)
Hemoglobin: 14.4 g/dL (ref 13.0–17.0)
MCH: 30.3 pg (ref 26.0–34.0)
MCHC: 33.8 g/dL (ref 30.0–36.0)
MCV: 89.5 fL (ref 80.0–100.0)
Platelets: 184 10*3/uL (ref 150–400)
RBC: 4.76 MIL/uL (ref 4.22–5.81)
RDW: 14.4 % (ref 11.5–15.5)
WBC: 6.1 10*3/uL (ref 4.0–10.5)
nRBC: 0 % (ref 0.0–0.2)

## 2017-12-22 LAB — URINALYSIS, MICROSCOPIC ONLY

## 2017-12-22 MED ORDER — ONDANSETRON HCL 4 MG/2ML IJ SOLN
4.0000 mg | Freq: Once | INTRAMUSCULAR | Status: AC
  Administered 2017-12-22: 4 mg via INTRAVENOUS
  Filled 2017-12-22: qty 2

## 2017-12-22 MED ORDER — ONDANSETRON 4 MG PO TBDP: 4.0000 mg | ORAL_TABLET | Freq: Three times a day (TID) | ORAL | 0 refills | Status: DC | PRN

## 2017-12-22 MED ORDER — FENTANYL CITRATE (PF) 100 MCG/2ML IJ SOLN
50.0000 ug | INTRAMUSCULAR | Status: DC | PRN
  Administered 2017-12-22: 50 ug via INTRAVENOUS
  Filled 2017-12-22: qty 2

## 2017-12-22 MED ORDER — HYDROCODONE-ACETAMINOPHEN 5-325 MG PO TABS: 1.0000 | ORAL_TABLET | ORAL | 0 refills | Status: DC | PRN

## 2017-12-22 MED ORDER — KETOROLAC TROMETHAMINE 30 MG/ML IJ SOLN
15.0000 mg | Freq: Once | INTRAMUSCULAR | Status: AC
  Administered 2017-12-22: 15 mg via INTRAVENOUS
  Filled 2017-12-22: qty 1

## 2017-12-22 NOTE — ED Notes (Signed)
Iv dc'ed.  D/c inst to pt 

## 2017-12-22 NOTE — ED Provider Notes (Signed)
[facility] Emergency Department Provider Note   ____________________________________________    I have reviewed the triage vital signs and the nursing notes.   HISTORY  Chief Complaint Flank Pain     HPI [name] [name] is a 77 y.o. male presents with complaints of right flank pain.  Patient reports a very long history of kidney stones and reports that he developed right flank pain which became acutely worse prior to his arrival to the emergency department.  He reports the pain is sharp and radiating to his groin.  He did notice some hematuria and decreased urination.  No fevers or chills.  No dysuria.  Has an appointment with urology on Wednesday of this week.  Has not taken anything for this.  Past Medical History:  Diagnosis Date  . Aortic insufficiency    a. noted on TTE 2015  . Arthritis   . CAD (coronary artery disease)    a. remote PCI in 1991 and 2005; b. MV 3/15: old inferior MI, no ischemia, LVEF 50%, slight inferior wall hypokniesis  . Chicken pox   . Colon polyps    4 pre-cancerous   . Diverticulitis   . DM type 2 (diabetes mellitus, type 2) ([place])   . Hyperlipidemia   . Hypertension   . Kidney stones   . Melanoma ([place]) 1980   Resected from his back  . Mitral regurgitation    a. noted on TTE 2015  . Myocardial infarction ([place])   . Systolic dysfunction    a. TTE 2015: EF  50-55%, mild global hypokinesis, mild to moderate aortic sclerosis, mild aortic insufficiency, mild to moderate mitral regurgitation, mild tricuspid regurgitation, moderately dilated left atrium, mildly dilated right ventricle, PASP 31 mmHg    Patient Active Problem List   Diagnosis Date Noted  . SCC (squamous cell carcinoma) 05/15/2017  . Cervical stenosis of spine 05/15/2017  . Tobacco abuse 01/26/2017  . Cardiac murmur 01/08/2017  . Right renal mass 10/31/2016  . Abdominal pain 10/31/2016  . Fatty liver 10/31/2016  . Hematuria 10/31/2016  . Right ear  pain 08/21/2016  . Carotid artery disease ([place]) 10/03/2015  . Atherosclerosis of abdominal aorta ([place]) 07/28/2015  . CAD (coronary artery disease) 07/10/2015  . Essential hypertension 07/10/2015  . Mixed hyperlipidemia 07/10/2015  . Controlled type 2 diabetes mellitus with complication, without long-term current use of insulin ([name]) 07/10/2015  . History of melanoma 07/10/2015  . Chronic low back pain 07/10/2015  . BPH (benign prostatic hyperplasia) 07/10/2015  . Bilateral hand numbness 07/10/2015  . Insomnia 07/10/2015    Past Surgical History:  Procedure Laterality Date  . CHOLECYSTECTOMY  2012  . COLONOSCOPY    . CORONARY ANGIOPLASTY WITH STENT PLACEMENT  1991 & 2005  . ESOPHAGOGASTRODUODENOSCOPY (EGD) WITH PROPOFOL N/A 11/19/2016   Procedure: ESOPHAGOGASTRODUODENOSCOPY (EGD) WITH PROPOFOL;  Surgeon: [name], MD;  Location: [facility] ENDOSCOPY;  Service: Endoscopy;  Laterality: N/A;  . LITHOTRIPSY  2015  . MELANOMA EXCISION  1980   malignant  . NERVE SURGERY  2015   ulna nerve  . TONSILLECTOMY  1945  . WISDOM TOOTH EXTRACTION      Prior to Admission medications   Medication Sig Start Date End Date Taking? Authorizing Provider  acetaminophen (TYLENOL) 650 MG CR tablet Take 650 mg by mouth every 8 (eight) hours as needed for pain.    [provider]  aspirin EC 81 MG tablet Take by mouth daily.     [provider]  atorvastatin (LIPITOR) 80 MG tablet TAKE 1 TABLET(80 MG) BY MOUTH DAILY 10/20/17   [name], [name]  carvedilol (COREG) 3.125 MG tablet Take 1 tablet (3.125 mg total) by mouth 2 (two) times daily. 03/17/17 06/15/17  Dunn, Areta Haber, PA-C  clonazePAM (KLONOPIN) 0.5 MG tablet Take 1 tablet (0.5 mg total) by mouth daily as needed. for anxiety 12/01/17   [name], [name]  CONTOUR TEST test strip USE AS DIRECTED 09/05/17   [name], [name]  fenofibrate 160 MG tablet TAKE 1 TABLET BY MOUTH EVERY DAY 11/17/17   [name], [name]    fluticasone (FLONASE) 50 MCG/ACT nasal spray Place 2 sprays into both nostrils daily. Patient taking differently: Place 2 sprays into both nostrils daily as needed.  10/03/15   Coral Spikes, DO  glipiZIDE (GLUCOTROL XL) 5 MG 24 hr tablet Take 1 tablet (5 mg total) by mouth daily with breakfast. 07/30/17   [name], [name]  glucose blood test strip Use as instructed to check blood sugar up to three times daily. J09.32 06/18/17   [name], [name]  hydrochlorothiazide (HYDRODIURIL) 25 MG tablet TAKE 1 TABLET(25 MG) BY MOUTH DAILY 09/29/17   [name], [name]  HYDROcodone-acetaminophen (NORCO/VICODIN) 5-325 MG tablet Take 1 tablet by mouth every 4 (four) hours as needed for moderate pain. 12/22/17   Lavonia Drafts, MD  lisinopril (PRINIVIL,ZESTRIL) 10 MG tablet TAKE 1 TABLET BY MOUTH DAILY 10/20/17   [name], [name]  Multiple Vitamin (MULTIVITAMIN) capsule Take 1 capsule by mouth daily.    [provider]  Omega-3 Fatty Acids (FISH OIL PO) Take by mouth daily.    [provider]  ondansetron (ZOFRAN ODT) 4 MG disintegrating tablet Take 1 tablet (4 mg total) by mouth every 8 (eight) hours as needed for nausea or vomiting. 12/22/17   Lavonia Drafts, MD  pantoprazole (PROTONIX) 40 MG tablet Take 1 tablet (40 mg total) by mouth 2 (two) times daily. 08/20/17   [name], MD  psyllium (METAMUCIL) 58.6 % powder Take 1 packet by mouth daily.    [provider]  tamsulosin (FLOMAX) 0.4 MG CAPS capsule TAKE 1 CAPSULE(0.4 MG) BY MOUTH DAILY 10/21/17   [name], MD  traMADol (ULTRAM) 50 MG tablet Take 1 tablet (50 mg total) by mouth every 12 (twelve) hours as needed. 12/08/17   [name], [name]     Allergies Azithromycin  Family History  Problem Relation Age of Onset  . Hyperlipidemia Mother   . Hypertension Mother   . Heart disease Mother   . Diabetes Mother   . Colon cancer Father   . Lung cancer Father   . Kidney cancer Father         malignant capsulated kidney tumor  . Diabetes Father   . Bladder Cancer Neg Hx   . Prostate cancer Neg Hx     Social History Social History   Tobacco Use  . Smoking status: Current Some Day Smoker    Types: Pipe  . Smokeless tobacco: Former Systems developer    Quit date: 10/17/2015  Substance Use Topics  . Alcohol use: Yes    Alcohol/week: 0.0 - 1.0 standard drinks    Comment: none last 24hrs  . Drug use: No    Review of Systems  Constitutional: No fever/chills Eyes: No visual changes.  ENT: No sore throat. Cardiovascular: Denies chest pain. Respiratory: Denies shortness of breath. Gastrointestinal: As above Genitourinary: As above Musculoskeletal: Negative for  back pain. Skin: Negative for rash. Neurological: Negative for headaches    ____________________________________________   PHYSICAL EXAM:  VITAL SIGNS: ED Triage Vitals  Enc Vitals Group     BP 12/22/17 1806 106/68     Pulse Rate 12/22/17 1806 (!) 55     Resp 12/22/17 1806 18     Temp 12/22/17 1806 98.1 F (36.7 C)     Temp Source 12/22/17 1806 Oral     SpO2 12/22/17 1806 96 %     Weight 12/22/17 1807 92.5 kg (204 lb)     Height 12/22/17 1807 1.854 m (6\' 1" )     Head Circumference --      Peak Flow --      Pain Score 12/22/17 1807 8     Pain Loc --      Pain Edu? --      Excl. in [place]? --     Constitutional: Alert and oriented. No acute distress. Pleasant and interactive  Nose: No congestion/rhinnorhea. Mouth/Throat: Mucous membranes are moist.   Neck:  Painless ROM Cardiovascular: Normal rate, regular rhythm. Grossly normal heart sounds.  Good peripheral circulation. Respiratory: Normal respiratory effort.  No retractions. Lungs CTAB. Gastrointestinal: Soft and nontender. No distention.  No CVA tenderness. Genitourinary: deferred Musculoskeletal:  Warm and well perfused Neurologic:  Normal speech and language. No gross focal neurologic deficits are appreciated.  Skin:  Skin is warm, dry and intact. No  rash noted. Psychiatric: Mood and affect are normal. Speech and behavior are normal.  ____________________________________________   LABS (all labs ordered are listed, but only abnormal results are displayed)  Labs Reviewed  URINALYSIS, COMPLETE (UACMP) WITH MICROSCOPIC - Abnormal; Notable for the following components:      Result Value   Color, Urine YELLOW (*)    APPearance CLEAR (*)    Hgb urine dipstick MODERATE (*)    Leukocytes, UA TRACE (*)    All other components within normal limits  BASIC METABOLIC PANEL - Abnormal; Notable for the following components:   Potassium 3.4 (*)    Glucose, Bld 163 (*)    BUN 24 (*)    All other components within normal limits  CBC   ____________________________________________  EKG  None ____________________________________________  RADIOLOGY  X-ray demonstrates 8 millimeter right distal ureteral stone ____________________________________________   PROCEDURES  Procedure(s) performed: No  Procedures   Critical Care performed: No ____________________________________________   INITIAL IMPRESSION / ASSESSMENT AND PLAN / ED COURSE  Pertinent labs & imaging results that were available during my care of the patient were reviewed by me and considered in my medical decision making (see chart for details).  Patient presents with right flank pain consistent with ureterolithiasis given his history.  Urinalysis demonstrates hemoglobin, trace leukocytes no bacteria.  Patient felt much better after Toradol  Discussed with Dr. Gilford Rile of urology, patient will follow-up outpatient as scheduled    ____________________________________________   FINAL CLINICAL IMPRESSION(S) / ED DIAGNOSES  Final diagnoses:  Ureterolithiasis        Note:  This document was prepared using Dragon voice recognition software and may include unintentional dictation errors.    Lavonia Drafts, MD 12/22/17 2223

## 2017-12-22 NOTE — ED Notes (Signed)
Pt ambulatory to treatment room. MD at bedside with pt and family.

## 2017-12-22 NOTE — ED Triage Notes (Signed)
R flank pain began yesterday, now pain R groin.

## 2017-12-23 ENCOUNTER — Encounter: Payer: Self-pay | Admitting: Family

## 2017-12-24 ENCOUNTER — Ambulatory Visit: Payer: Medicare Other | Admitting: Family

## 2017-12-25 ENCOUNTER — Ambulatory Visit
Admission: RE | Admit: 2017-12-25 | Discharge: 2017-12-25 | Disposition: A | Discharge status: Home / Self Care | Payer: Medicare Other | Source: Ambulatory Visit | Attending: Urology | Admitting: Urology

## 2017-12-25 ENCOUNTER — Ambulatory Visit: Payer: Medicare Other

## 2017-12-25 ENCOUNTER — Encounter: Payer: Self-pay | Admitting: Urology

## 2017-12-25 ENCOUNTER — Ambulatory Visit (INDEPENDENT_AMBULATORY_CARE_PROVIDER_SITE_OTHER): Payer: Medicare Other | Admitting: Urology

## 2017-12-25 VITALS — BP 169/91 | HR 60 | Ht 73.0 in | Wt 210.1 lb

## 2017-12-25 DIAGNOSIS — N201 Calculus of ureter: Secondary | ICD-10-CM | POA: Insufficient documentation

## 2017-12-25 DIAGNOSIS — N202 Calculus of kidney with calculus of ureter: Secondary | ICD-10-CM | POA: Diagnosis not present

## 2017-12-25 NOTE — Progress Notes (Signed)
   12/25/2017 3:00 PM   [name] [name] November 04, 1940 [id number]  Reason for visit: Follow up Nephrolithiasis, hx of renal cysts  HPI: I saw [name] [name] in follow-up today for nephrolithiasis and history of renal cyst.  He was previously followed by Dr. Junious Silk.  He did have a renal MRI in June 2019 which showed bilateral renal cysts, including some complex cyst.  The on the right side there was a Bosniak 38F cyst and they recommended follow-up MRI in 1 year.  He also has an extensive history of stones, with significant stone burden bilaterally.  He was seen in the emergency department on 12/22/2017 with right-sided flank pain, and KUB demonstrated a likely 59m stone in the right distal ureter.  His pain is currently very well controlled and he denies any complaint.  He has been straining his urine and has not caught a stone yet.  He is on Flomax at baseline for urinary symptoms.  He did have some mild hematuria when he was having his right-sided flank pain.  There are no aggravating or alleviating factors.  Urinalysis in the emergency department was negative for infection.  He denies any fevers, chills, or dysuria.  ROS: Please see flowsheet from today's date for complete review of systems.  Physical Exam: BP (!) 169/91 (BP Location: Left Arm, Patient Position: Sitting)   Pulse 60   Ht '6\' 1"'$  (1.854 m)   Wt 210 lb 1.6 oz (95.3 kg)   BMI 27.72 kg/m    Constitutional:  Alert and oriented, No acute distress. Respiratory: Normal respiratory effort, no increased work of breathing. GI: Abdomen is soft, nontender, nondistended, no abdominal masses GU: No CVA tenderness Skin: No rashes, bruises or suspicious lesions. Neurologic: Grossly intact, no focal deficits, moving all 4 extremities. Psychiatric: Normal mood and affect  Laboratory Data: Reviewed Serum creatinine 1.07, EGFR greater than 60  Pertinent Imaging: I have personally reviewed the KUB dated 12/22/2017.  Significant stone  disease bilaterally, right 7 mm distal ureteral stone  Assessment & Plan:   In summary, [name] [name] a 77year old male with bilateral renal cyst being followed with MRI, with next imaging due in January 2019.  He also has a long history of nephrolithiasis, and currently has a right distal 7 mm ureteral stone on KUB.  This was confirmed with KUB today.  He is currently asymptomatic.  He will continue to strain his urine.  We discussed the risks and benefits at length, and he elected to proceed with right ureteroscopy, laser lithotripsy, and stent placement.  Continue to strain urine, continue Flomax We will schedule right ureteroscopy, laser lithotripsy, stent placement  BBilley Co MD  [address] [place] [phone]

## 2017-12-26 ENCOUNTER — Telehealth: Payer: Self-pay | Admitting: Cardiovascular Disease

## 2017-12-26 ENCOUNTER — Other Ambulatory Visit: Payer: Self-pay | Admitting: Radiology

## 2017-12-26 DIAGNOSIS — N201 Calculus of ureter: Secondary | ICD-10-CM

## 2017-12-26 NOTE — Telephone Encounter (Signed)
° °  [facility] Pre-operative Risk Assessment    Request for surgical clearance:  1. What type of surgery is being performed? R ureteroscopy laser lithotripsy R ureteral stent placement   2. When is this surgery scheduled? 01/02/18  3. What type of clearance is required (medical clearance vs. Pharmacy clearance to hold med vs. Both)? Both   4. Are there any medications that need to be held prior to surgery and how long? Asa 81 mg x 7 days   5. Practice name and name of physician performing surgery? [facility] Dr. Berenda Morale   6. What is your office phone number [phone]   7.   What is your office fax number [phone]  8.   Anesthesia type (None, local, MAC, general) ? Not noted    Clarisse Gouge 12/26/2017, 5:04 PM  _________________________________________________________________   (provider comments below)

## 2017-12-26 NOTE — Telephone Encounter (Signed)
To Dr. [name] to review.  

## 2017-12-27 NOTE — Telephone Encounter (Signed)
Given 2 prior older stents Would not stop the asa 86 daily Hope we can do the procedure on low dose asa Otherwise acceptable risk, no further testing needed

## 2017-12-29 ENCOUNTER — Other Ambulatory Visit: Payer: Self-pay | Admitting: Radiology

## 2017-12-29 ENCOUNTER — Telehealth: Payer: Self-pay | Admitting: Radiology

## 2017-12-29 ENCOUNTER — Other Ambulatory Visit: Payer: Medicare Other

## 2017-12-29 DIAGNOSIS — N201 Calculus of ureter: Secondary | ICD-10-CM

## 2017-12-29 LAB — URINALYSIS, COMPLETE
Bilirubin, UA: NEGATIVE
Glucose, UA: NEGATIVE
Ketones, UA: NEGATIVE
Nitrite, UA: NEGATIVE
Protein, UA: NEGATIVE
Specific Gravity, UA: 1.015 (ref 1.005–1.030)
Urobilinogen, Ur: 1 mg/dL (ref 0.2–1.0)
pH, UA: 6 (ref 5.0–7.5)

## 2017-12-29 LAB — MICROSCOPIC EXAMINATION: Epithelial Cells (non renal): NONE SEEN /hpf (ref 0–10)

## 2017-12-29 NOTE — Telephone Encounter (Signed)
Patient was given the [facility] Information form below as well as the Instructions for Pre-Admission Testing form & a map of [facility]   [address], [place], [place] [address] Telephone: [phone] Fax: [phone]   Thank you for choosing [facility] for your upcoming surgery!  We are always here to assist in your urological needs.  Please read the following information with specific details for your upcoming appointments related to your surgery. Please contact Trayden Brandy at [phone] Option 3 with any questions.  The Name of Your Surgery: Right ureteroscopy, laser lithotripsy, stone removal, Right ureteral stent placement  Your Surgery Date: 01/02/2018 Your Surgeon: Nickolas Madrid  Please call Same Day Surgery at [phone] between the hours of 1pm-3pm one day prior to your surgery. They will inform you of the time to arrive at Same Day Surgery which is located on the second floor of the [facility].   Please refer to the attached letter regarding instructions for Pre-Admission Testing. You will receive a call from the [facility] office regarding your appointment with them.  The Pre-Admission Testing office is located at [place], on the first floor of the [place] [facility] in [place] (office is to the right as you enter through the [employer] of the [employer]). Please have all medications you are currently taking and your insurance card available.   Patient was advised to have nothing to eat or drink after midnight the night prior to surgery except that he may have only water until 2 hours before surgery with nothing to drink within 2 hours of surgery.  The patient states he currently takes aspirin 81mg  & was informed to continue medication per [name]. Patient's questions were answered and he  expressed understanding of these instructions.

## 2017-12-29 NOTE — Telephone Encounter (Signed)
Copy of phone encounter with MD recommendations faxed to Dr. Jeb Levering at [facility] at [phone]. Confirmation received.

## 2017-12-31 ENCOUNTER — Inpatient Hospital Stay: Admission: RE | Admit: 2017-12-31 | Payer: Medicare Other | Source: Ambulatory Visit

## 2017-12-31 LAB — CULTURE, URINE COMPREHENSIVE

## 2018-01-01 ENCOUNTER — Encounter
Admission: RE | Admit: 2018-01-01 | Discharge: 2018-01-01 | Disposition: A | Discharge status: Home / Self Care | Payer: Medicare Other | Source: Ambulatory Visit | Attending: Urology | Admitting: Urology

## 2018-01-01 ENCOUNTER — Other Ambulatory Visit: Payer: Self-pay

## 2018-01-01 DIAGNOSIS — R001 Bradycardia, unspecified: Secondary | ICD-10-CM

## 2018-01-01 DIAGNOSIS — I10 Essential (primary) hypertension: Secondary | ICD-10-CM | POA: Diagnosis not present

## 2018-01-01 DIAGNOSIS — N201 Calculus of ureter: Secondary | ICD-10-CM

## 2018-01-01 DIAGNOSIS — I252 Old myocardial infarction: Secondary | ICD-10-CM

## 2018-01-01 DIAGNOSIS — M1990 Unspecified osteoarthritis, unspecified site: Secondary | ICD-10-CM | POA: Diagnosis not present

## 2018-01-01 DIAGNOSIS — F17200 Nicotine dependence, unspecified, uncomplicated: Secondary | ICD-10-CM | POA: Diagnosis not present

## 2018-01-01 DIAGNOSIS — N210 Calculus in bladder: Secondary | ICD-10-CM | POA: Diagnosis not present

## 2018-01-01 DIAGNOSIS — N202 Calculus of kidney with calculus of ureter: Secondary | ICD-10-CM | POA: Diagnosis present

## 2018-01-01 DIAGNOSIS — E118 Type 2 diabetes mellitus with unspecified complications: Secondary | ICD-10-CM | POA: Diagnosis not present

## 2018-01-01 DIAGNOSIS — N200 Calculus of kidney: Secondary | ICD-10-CM | POA: Diagnosis not present

## 2018-01-01 DIAGNOSIS — R9431 Abnormal electrocardiogram [ECG] [EKG]: Secondary | ICD-10-CM

## 2018-01-01 DIAGNOSIS — I2510 Atherosclerotic heart disease of native coronary artery without angina pectoris: Secondary | ICD-10-CM | POA: Diagnosis not present

## 2018-01-01 HISTORY — DX: Gastro-esophageal reflux disease without esophagitis: K21.9

## 2018-01-01 HISTORY — DX: Cardiac murmur, unspecified: R01.1

## 2018-01-01 HISTORY — DX: Family history of other specified conditions: Z84.89

## 2018-01-01 NOTE — Pre-Procedure Instructions (Signed)
Per Dr. Rosey Bath (Anesthesiology) EKG done today no significant change from previous EKG 03/14/2017.  No additional workup needed prior to surgery tomorrow.

## 2018-01-01 NOTE — Patient Instructions (Signed)
  Your procedure is scheduled on: Friday January 02, 2018 @ 11:45 am Report to Same Day Surgery 2nd floor [facility] Entrance-take elevator on left to 2nd floor.  Check in with surgery information desk.)  Remember: Instructions that are not followed completely may result in serious medical risk, up to and including death, or upon the discretion of your surgeon and anesthesiologist your surgery may need to be rescheduled.    __x__ 1. Do not eat food (including mints, candies, chewing gum) after midnight. You may drink water up to 2 hours before you are scheduled to arrive at the hospital for your procedure.  Do not drink anything within 2 hours of your scheduled arrival to the hospital.    __x__ 2. No Alcohol for 24 hours before or after surgery.   __x__ 3. No Smoking or e-cigarettes for 24 hours before surgery.  Do not use any chewable tobacco products for at least 6 hours before surgery.   __x__ 4. Notify Same Day Surgery check-in @ [phone] if there is any change in your medical condition (cold, fever, infections).   __x__ 5. On the morning of surgery brush your teeth with toothpaste and water.  You may rinse your mouth with mouthwash if you wish.  Do not swallow any toothpaste or mouthwash.   __x__ 6. Use antibacterial soap such as Dial to shower/bathe on the day of surgery.   Do not wear jewelry, lotions, powders, deodorant, or perfumes on the day of surgery.  Do not bring valuables to the hospital.    [facility] is not responsible for any belongings or valuables.               Contacts, hearing aids, dentures or bridgework may not be worn into surgery.  For patients discharged on the day of surgery, you will NOT be permitted to drive yourself home.  You must have a responsible adult with you for 24 hours after surgery.  __x__ Take the following with a small sip of water on the morning of surgery:  1. Carvedilol (Coreg)  2. Pantoprazole (Protonix)  3. Tylenol  and/or Tramadol if needed for pain  __x__ Follow recommendations from Cardiologist, Pulmonologist or PCP regarding stopping Aspirin.  __x__ TODAY: Avoid anti-inflammatories such as Advil, Ibuprofen, Motrin, Aleve, Naproxen, Naprosyn, BC/Goodies powders or aspirin products. You may continue to take Tylenol and Celebrex.   __x__ TODAY: Avoid supplements until after surgery. You may continue to take Vitamin D, Vitamin B, and multivitamin.

## 2018-01-02 ENCOUNTER — Encounter: Payer: Self-pay | Admitting: Anesthesiology

## 2018-01-02 ENCOUNTER — Ambulatory Visit: Payer: Medicare Other | Admitting: Anesthesiology

## 2018-01-02 ENCOUNTER — Encounter
Admission: RE | Disposition: A | Discharge status: Home / Self Care | Payer: Self-pay | Source: Home / Self Care | Attending: Urology

## 2018-01-02 ENCOUNTER — Ambulatory Visit
Admission: RE | Admit: 2018-01-02 | Discharge: 2018-01-02 | Disposition: A | Discharge status: Home / Self Care | Payer: Medicare Other | Attending: Urology | Admitting: Urology

## 2018-01-02 DIAGNOSIS — F17200 Nicotine dependence, unspecified, uncomplicated: Secondary | ICD-10-CM | POA: Diagnosis not present

## 2018-01-02 DIAGNOSIS — N201 Calculus of ureter: Secondary | ICD-10-CM

## 2018-01-02 DIAGNOSIS — I10 Essential (primary) hypertension: Secondary | ICD-10-CM | POA: Diagnosis not present

## 2018-01-02 DIAGNOSIS — I2510 Atherosclerotic heart disease of native coronary artery without angina pectoris: Secondary | ICD-10-CM | POA: Diagnosis not present

## 2018-01-02 DIAGNOSIS — E118 Type 2 diabetes mellitus with unspecified complications: Secondary | ICD-10-CM | POA: Insufficient documentation

## 2018-01-02 DIAGNOSIS — N210 Calculus in bladder: Secondary | ICD-10-CM | POA: Insufficient documentation

## 2018-01-02 DIAGNOSIS — N200 Calculus of kidney: Secondary | ICD-10-CM | POA: Insufficient documentation

## 2018-01-02 DIAGNOSIS — I252 Old myocardial infarction: Secondary | ICD-10-CM | POA: Diagnosis not present

## 2018-01-02 DIAGNOSIS — M1990 Unspecified osteoarthritis, unspecified site: Secondary | ICD-10-CM | POA: Insufficient documentation

## 2018-01-02 DIAGNOSIS — E782 Mixed hyperlipidemia: Secondary | ICD-10-CM | POA: Diagnosis not present

## 2018-01-02 DIAGNOSIS — K219 Gastro-esophageal reflux disease without esophagitis: Secondary | ICD-10-CM | POA: Diagnosis not present

## 2018-01-02 DIAGNOSIS — E119 Type 2 diabetes mellitus without complications: Secondary | ICD-10-CM | POA: Diagnosis not present

## 2018-01-02 HISTORY — PX: CYSTOSCOPY/URETEROSCOPY/HOLMIUM LASER/STENT PLACEMENT: SHX6546

## 2018-01-02 LAB — GLUCOSE, CAPILLARY
Glucose-Capillary: 135 mg/dL — ABNORMAL HIGH (ref 70–99)
Glucose-Capillary: 127 mg/dL — ABNORMAL HIGH (ref 70–99)

## 2018-01-02 SURGERY — CYSTOSCOPY/URETEROSCOPY/HOLMIUM LASER/STENT PLACEMENT
Anesthesia: General | Site: Ureter | Laterality: Right

## 2018-01-02 MED ORDER — DEXAMETHASONE SODIUM PHOSPHATE 10 MG/ML IJ SOLN
INTRAMUSCULAR | Status: DC | PRN
  Administered 2018-01-02: 10 mg via INTRAVENOUS

## 2018-01-02 MED ORDER — CEFAZOLIN SODIUM-DEXTROSE 2-4 GM/100ML-% IV SOLN: 2.0000 g | INTRAVENOUS | Status: DC

## 2018-01-02 MED ORDER — ROCURONIUM BROMIDE 100 MG/10ML IV SOLN
INTRAVENOUS | Status: DC | PRN
  Administered 2018-01-02: 30 mg via INTRAVENOUS

## 2018-01-02 MED ORDER — EPHEDRINE SULFATE 50 MG/ML IJ SOLN
INTRAMUSCULAR | Status: AC
  Filled 2018-01-02: qty 1

## 2018-01-02 MED ORDER — PROPOFOL 10 MG/ML IV BOLUS
INTRAVENOUS | Status: AC
  Filled 2018-01-02: qty 20

## 2018-01-02 MED ORDER — SODIUM CHLORIDE 0.9 % IV SOLN
INTRAVENOUS | Status: DC
  Administered 2018-01-02: 11:00:00 via INTRAVENOUS

## 2018-01-02 MED ORDER — SUGAMMADEX SODIUM 200 MG/2ML IV SOLN
INTRAVENOUS | Status: DC | PRN
  Administered 2018-01-02: 100 mg via INTRAVENOUS

## 2018-01-02 MED ORDER — SULFAMETHOXAZOLE-TRIMETHOPRIM 800-160 MG PO TABS: 1.0000 | ORAL_TABLET | Freq: Two times a day (BID) | ORAL | 0 refills | Status: DC

## 2018-01-02 MED ORDER — FENTANYL CITRATE (PF) 100 MCG/2ML IJ SOLN: 25.0000 ug | INTRAMUSCULAR | Status: DC | PRN

## 2018-01-02 MED ORDER — ONDANSETRON HCL 4 MG/2ML IJ SOLN: 4.0000 mg | Freq: Once | INTRAMUSCULAR | Status: DC | PRN

## 2018-01-02 MED ORDER — SEVOFLURANE IN SOLN
RESPIRATORY_TRACT | Status: AC
  Filled 2018-01-02: qty 250

## 2018-01-02 MED ORDER — PROPOFOL 10 MG/ML IV BOLUS
INTRAVENOUS | Status: DC | PRN
  Administered 2018-01-02: 150 mg via INTRAVENOUS

## 2018-01-02 MED ORDER — CEFAZOLIN SODIUM-DEXTROSE 2-4 GM/100ML-% IV SOLN
INTRAVENOUS | Status: AC
  Filled 2018-01-02: qty 100

## 2018-01-02 MED ORDER — EPHEDRINE SULFATE 50 MG/ML IJ SOLN
INTRAMUSCULAR | Status: DC | PRN
  Administered 2018-01-02: 15 mg via INTRAVENOUS

## 2018-01-02 MED ORDER — LIDOCAINE HCL (CARDIAC) PF 100 MG/5ML IV SOSY
PREFILLED_SYRINGE | INTRAVENOUS | Status: DC | PRN
  Administered 2018-01-02: 40 mg via INTRAVENOUS

## 2018-01-02 MED ORDER — ONDANSETRON HCL 4 MG/2ML IJ SOLN
INTRAMUSCULAR | Status: DC | PRN
  Administered 2018-01-02: 4 mg via INTRAVENOUS

## 2018-01-02 MED ORDER — IOPAMIDOL (ISOVUE-M 200) INJECTION 41%
INTRAMUSCULAR | Status: DC | PRN
  Administered 2018-01-02: 20 mL

## 2018-01-02 MED ORDER — MIDAZOLAM HCL 2 MG/2ML IJ SOLN
INTRAMUSCULAR | Status: DC | PRN
  Administered 2018-01-02 (×2): 1 mg via INTRAVENOUS

## 2018-01-02 MED ORDER — FENTANYL CITRATE (PF) 100 MCG/2ML IJ SOLN
INTRAMUSCULAR | Status: DC | PRN
  Administered 2018-01-02 (×2): 50 ug via INTRAVENOUS

## 2018-01-02 MED ORDER — PHENYLEPHRINE HCL 10 MG/ML IJ SOLN
INTRAMUSCULAR | Status: DC | PRN
  Administered 2018-01-02: 50 ug via INTRAVENOUS

## 2018-01-02 MED ORDER — MIDAZOLAM HCL 2 MG/2ML IJ SOLN
INTRAMUSCULAR | Status: AC
  Filled 2018-01-02: qty 2

## 2018-01-02 MED ORDER — FENTANYL CITRATE (PF) 100 MCG/2ML IJ SOLN
INTRAMUSCULAR | Status: AC
  Filled 2018-01-02: qty 2

## 2018-01-02 SURGICAL SUPPLY — 33 items
BAG DRAIN CYSTO-URO LG1000N (MISCELLANEOUS) ×2 IMPLANT
BRUSH SCRUB EZ 1% IODOPHOR (MISCELLANEOUS) ×2 IMPLANT
BULB IRRIG PATHFIND (MISCELLANEOUS) IMPLANT
CATH URETL 5X70 OPEN END (CATHETERS) ×1 IMPLANT
CNTNR SPEC 2.5X3XGRAD LEK (MISCELLANEOUS) ×1
CONT SPEC 4OZ STER OR WHT (MISCELLANEOUS) ×1
CONTAINER SPEC 2.5X3XGRAD LEK (MISCELLANEOUS) IMPLANT
DRAPE UTILITY 15X26 TOWEL STRL (DRAPES) ×2 IMPLANT
FIBER LASER LITHO 273 (Laser) ×1 IMPLANT
GLOVE BIOGEL PI IND STRL 7.5 (GLOVE) ×1 IMPLANT
GLOVE BIOGEL PI INDICATOR 7.5 (GLOVE) ×1
GOWN STRL REUS W/ TWL LRG LVL3 (GOWN DISPOSABLE) ×1 IMPLANT
GOWN STRL REUS W/ TWL XL LVL3 (GOWN DISPOSABLE) ×1 IMPLANT
GOWN STRL REUS W/TWL LRG LVL3 (GOWN DISPOSABLE) ×1
GOWN STRL REUS W/TWL XL LVL3 (GOWN DISPOSABLE) ×1
INFUSOR MANOMETER BAG 3000ML (MISCELLANEOUS) ×2 IMPLANT
INTRODUCER DILATOR DOUBLE (INTRODUCER) IMPLANT
KIT TURNOVER CYSTO (KITS) ×2 IMPLANT
PACK CYSTO AR (MISCELLANEOUS) ×2 IMPLANT
SENSORWIRE 0.038 NOT ANGLED (WIRE) ×2
SET CYSTO W/LG BORE CLAMP LF (SET/KITS/TRAYS/PACK) ×2 IMPLANT
SHEATH URETERAL 12FRX35CM (MISCELLANEOUS) IMPLANT
SOL .9 NS 3000ML IRR  AL (IV SOLUTION) ×1
SOL .9 NS 3000ML IRR UROMATIC (IV SOLUTION) ×1 IMPLANT
STENT URET 6FRX24 CONTOUR (STENTS) IMPLANT
STENT URET 6FRX26 CONTOUR (STENTS) IMPLANT
STENT URET 6FRX28 CONTOUR (STENTS) ×1 IMPLANT
SURGILUBE 2OZ TUBE FLIPTOP (MISCELLANEOUS) ×2 IMPLANT
SYR 10ML LL (SYRINGE) ×2 IMPLANT
TUBING ART PRESS 48 MALE/FEM (TUBING) IMPLANT
VALVE UROSEAL ADJ ENDO (VALVE) ×1 IMPLANT
WATER STERILE IRR 1000ML POUR (IV SOLUTION) ×2 IMPLANT
WIRE SENSOR 0.038 NOT ANGLED (WIRE) ×1 IMPLANT

## 2018-01-02 NOTE — Anesthesia Preprocedure Evaluation (Addendum)
Anesthesia Evaluation  Patient identified by MRN, date of birth, ID band Patient awake    Reviewed: Allergy & Precautions, NPO status , Patient's Chart, lab work & pertinent test results, reviewed documented beta blocker date and time   History of Anesthesia Complications (+) Family history of anesthesia reaction  Airway Mallampati: III  TM Distance: >3 FB     Dental  (+) Chipped   Pulmonary Current Smoker,           Cardiovascular hypertension, Pt. on medications and Pt. on home beta blockers + CAD, + Past MI and + Cardiac Stents  + Valvular Problems/Murmurs      Neuro/Psych    GI/Hepatic GERD  Controlled,  Endo/Other  diabetes, Type 2  Renal/GU Renal disease     Musculoskeletal  (+) Arthritis ,   Abdominal   Peds  Hematology   Anesthesia Other Findings Smokes. EF 50. HR 55-60. EKG does show T wave inversions. Echo done a few months ago was normal with EF 50-55. No cardiac symptoms this am.  Reproductive/Obstetrics                            Anesthesia Physical Anesthesia Plan  ASA: III  Anesthesia Plan: General   Post-op Pain Management:    Induction: Intravenous  PONV Risk Score and Plan:   Airway Management Planned: Oral ETT and LMA  Additional Equipment:   Intra-op Plan:   Post-operative Plan:   Informed Consent: I have reviewed the patients History and Physical, chart, labs and discussed the procedure including the risks, benefits and alternatives for the proposed anesthesia with the patient or authorized representative who has indicated his/her understanding and acceptance.     Plan Discussed with: CRNA  Anesthesia Plan Comments:         Anesthesia Quick Evaluation

## 2018-01-02 NOTE — OR Nursing (Signed)
Discussed discharge instructions with wife and daughter. Both voice understanding.

## 2018-01-02 NOTE — Op Note (Signed)
Date of procedure: 01/02/18  Preoperative diagnosis:  1. Right 8 mm distal ureteral stone, multiple renal stones  Postoperative diagnosis:  1. Bladder stone, multiple renal stones  Procedure: 1. Cystolitholapaxy, right ureteroscopy, laser lithotripsy, stent placement, retrograde pyelogram  Surgeon: Nickolas Madrid, MD  Anesthesia: General  Complications: None  Intraoperative findings:  1.  Moderate size prostate.  Bladder mucosa normal throughout 2.  8 mm right distal ureteral stone had passed into the bladder, fragmented and extracted 3.  Multiple right renal stones dusted, stent placed  EBL: Minimal  Specimens: stone for analysis  Drains: Right 6 French x 28 cm ureteral stent  Indication: [name] [name] is a 77 y.o. patient with long history of stone disease and right-sided groin and flank pain.  CT and KUB showed multiple right renal stones and an 8 mm right distal ureteral stone.  He elected to undergo definitive management with ureteroscopy.  After reviewing the management options for treatment, they elected to proceed with the above surgical procedure(s). We have discussed the potential benefits and risks of the procedure, side effects of the proposed treatment, the likelihood of the patient achieving the goals of the procedure, and any potential problems that might occur during the procedure or recuperation. Informed consent has been obtained.  Description of procedure:  The patient was taken to the operating room and general anesthesia was induced.  The patient was placed in the dorsal lithotomy position, prepped and draped in the usual sterile fashion, and preoperative antibiotics were administered. A preoperative time-out was performed.   The 21 French rigid cystoscope was used to intubate the urethra.  Normal-appearing urethra was followed proximally to the bladder.  The prostate was moderate in size.  The bladder mucosa was grossly normal throughout.  I immediately  identified a black 8 mm stone in the bladder, likely from the right distal ureter as it was no longer visible on fluoroscopy in the right distal ureter.  This was fragmented into pieces using the 273 m laser fiber and evacuated from the bladder.  The stone was sent for analysis.  We then advanced a sensor wire into the right collecting system under fluoroscopic vision, and a flexible ureteroscope was advanced over the wire into the kidney.  There were multiple stones throughout, and these were fragmented to dust using the 273 m laser fiber on settings of 0.5 J and 40 Hz.  All the stones were black and hard, anticipate calcium oxalate monohydrate.  Thorough inspection revealed no other significant stone fragments.  Retrograde pyelogram showed no filling defects.  The sensor wire was passed back through the scope, and pullback ureteroscopy demonstrated no ureteral fragments.  The ureter was widely patent.  The 21 French rigid cystoscope was backloaded over the wire and a 6 [place] X 28 cm ureteral stent was uneventfully placed with an excellent curl noted in the midpole, as well as in the bladder under direct vision.  The bladder was drained and this concluded our procedure  Disposition: Stable to PACU  Plan: Follow-up in 10 days for stent removal Follow-up stone analysis Discussed stone prevention strategies at stent removal  Nickolas Madrid, MD

## 2018-01-02 NOTE — H&P (Signed)
UROLOGY H&P UPDATE  Agree with prior H&P dated 12/25/2017. Right 41mm distal ureteral stone, multiple right renal stones.  Cardiac: RRR Lungs: CTA bilaterally  Laterality: right Procedure: Right URS/LL/stent  Urinalysis: Culture 12/16 no growth  Informed consent obtained, we specifically discussed the risk of bleeding, infection, need for additional procedures, stent related symptoms, need for follow up stent removal, and ureteral injury.  [name], MD 01/02/2018

## 2018-01-02 NOTE — Transfer of Care (Signed)
Immediate Anesthesia Transfer of Care Note  Patient: [name] [name]  Procedure(s) Performed: CYSTOSCOPY/URETEROSCOPY/HOLMIUM LASER/STENT PLACEMENT (Right Ureter)  Patient Location: PACU  Anesthesia Type:General  Level of Consciousness: drowsy  Airway & Oxygen Therapy: Patient connected to face mask oxygen  Post-op Assessment: Report given to RN, Post -op Vital signs reviewed and stable and Patient moving all extremities  Post vital signs: Reviewed and stable  Last Vitals:  Vitals Value Taken Time  BP 141/80 01/02/2018  1:54 PM  Temp    Pulse 61 01/02/2018  1:55 PM  Resp 16 01/02/2018  1:55 PM  SpO2 98 % 01/02/2018  1:55 PM  Vitals shown include unvalidated device data.  Last Pain:  Vitals:   01/02/18 1046  TempSrc: Oral  PainSc: 0-No pain         Complications: No apparent anesthesia complications

## 2018-01-02 NOTE — Anesthesia Post-op Follow-up Note (Signed)
Anesthesia QCDR form completed.        

## 2018-01-02 NOTE — Anesthesia Postprocedure Evaluation (Signed)
Anesthesia Post Note  Patient: [name] [name]  Procedure(s) Performed: CYSTOSCOPY/URETEROSCOPY/HOLMIUM LASER/STENT PLACEMENT (Right Ureter)  Patient location during evaluation: PACU Anesthesia Type: General Level of consciousness: awake and alert Pain management: pain level controlled Vital Signs Assessment: post-procedure vital signs reviewed and stable Respiratory status: spontaneous breathing, nonlabored ventilation, respiratory function stable and patient connected to nasal cannula oxygen Cardiovascular status: blood pressure returned to baseline and stable Postop Assessment: no apparent nausea or vomiting Anesthetic complications: no     Last Vitals:  Vitals:   01/02/18 1431 01/02/18 1436  BP:    Pulse:  66  Resp:  14  Temp: (!) 36.2 C   SpO2:  96%    Last Pain:  Vitals:   01/02/18 1431  TempSrc:   PainSc: [place]

## 2018-01-02 NOTE — Discharge Instructions (Signed)

## 2018-01-05 ENCOUNTER — Encounter: Payer: Self-pay | Admitting: Urology

## 2018-01-05 ENCOUNTER — Telehealth: Payer: Self-pay | Admitting: Urology

## 2018-01-05 NOTE — Telephone Encounter (Signed)
Pt called office asking for more pain management until he is seen at his follow up appt.  Pt asking for a different pain med, not the "fast acting" he is asking for the longer lasting pain meds. Pt states he is almost out of his pain meds and wants this refill before Christmas. Please advise pt at [phone].

## 2018-01-08 ENCOUNTER — Other Ambulatory Visit: Payer: Self-pay | Admitting: Urology

## 2018-01-08 MED ORDER — HYDROCODONE-ACETAMINOPHEN 5-325 MG PO TABS: 1.0000 | ORAL_TABLET | Freq: Four times a day (QID) | ORAL | 0 refills | Status: AC | PRN

## 2018-01-08 NOTE — Telephone Encounter (Signed)
Left detailed message on voicemail per DPR advising that Dr. Diamantina Providence has sent pain medication to pharmacy and it is ok to take ibuprofen or aleve

## 2018-01-08 NOTE — Telephone Encounter (Signed)
Please let him know I called in a new script for Norco(narcotic) to his pharmacy. He can also use aleve or ibuprofen OTC which will typically last longer than the narcotic for pain control.  Nickolas Madrid, MD 01/08/2018

## 2018-01-15 ENCOUNTER — Encounter: Payer: Self-pay | Admitting: Urology

## 2018-01-15 ENCOUNTER — Ambulatory Visit (INDEPENDENT_AMBULATORY_CARE_PROVIDER_SITE_OTHER): Payer: Medicare Other | Admitting: Urology

## 2018-01-15 VITALS — BP 95/64 | HR 73 | Ht 73.0 in | Wt 206.0 lb

## 2018-01-15 DIAGNOSIS — N200 Calculus of kidney: Secondary | ICD-10-CM | POA: Diagnosis not present

## 2018-01-15 LAB — URINALYSIS, COMPLETE
Bilirubin, UA: NEGATIVE
Glucose, UA: NEGATIVE
Ketones, UA: NEGATIVE
Nitrite, UA: NEGATIVE
Specific Gravity, UA: 1.015 (ref 1.005–1.030)
Urobilinogen, Ur: 1 mg/dL (ref 0.2–1.0)
pH, UA: 7 (ref 5.0–7.5)

## 2018-01-15 LAB — MICROSCOPIC EXAMINATION

## 2018-01-15 NOTE — Progress Notes (Signed)
Cystoscopy Procedure Note:  Indication: Stent removal s/p R URS/LL/stent 12/20 for significant renal stone burden.  After informed consent and discussion of the procedure and its risks, [name] [name] was positioned and prepped in the standard fashion. Cystoscopy was performed with a flexible cystoscope. The stent was grasped with flexible graspers and removed in its entirety. The patient tolerated the procedure well.  Findings: Uncomplicated stent removal  Assessment and Plan: Stone analysis pending Follow up in 4 weeks with renal ultrasound to evaluate for silent hydronephrosis Discuss 24 hour urine and stone analysis at that time and discuss prevention strategies  We discussed general stone prevention strategies including adequate hydration with goal of producing 2.5 L of urine daily, increasing citric acid intake, increasing calcium intake during high oxalate meals, minimizing animal protein, and decreasing salt intake. Information about dietary recommendations given today.   [name], MD 01/15/2018

## 2018-01-20 LAB — STONE ANALYSIS
Ca Oxalate,Monohydr.: 80 %
Ca phos cry stone ql IR: 20 %
Stone Weight KSTONE: 150.4 mg

## 2018-02-04 ENCOUNTER — Ambulatory Visit
Admission: RE | Admit: 2018-02-04 | Discharge: 2018-02-04 | Disposition: A | Discharge status: Home / Self Care | Payer: Medicare Other | Source: Ambulatory Visit | Attending: Urology | Admitting: Urology

## 2018-02-04 ENCOUNTER — Other Ambulatory Visit: Payer: Self-pay | Admitting: Family

## 2018-02-04 DIAGNOSIS — N200 Calculus of kidney: Secondary | ICD-10-CM | POA: Diagnosis not present

## 2018-02-04 DIAGNOSIS — G4700 Insomnia, unspecified: Secondary | ICD-10-CM

## 2018-02-05 NOTE — Telephone Encounter (Signed)
Call pt I refilled Klonopin.  Remind him that he does need to maintain close to 3 to 4 months follow-ups with me since a controlled substance.  He might want to move his follow-up appointment in June earlier.  Thanks

## 2018-02-07 ENCOUNTER — Other Ambulatory Visit: Payer: Self-pay | Admitting: Family

## 2018-02-09 ENCOUNTER — Other Ambulatory Visit: Payer: Self-pay | Admitting: Family

## 2018-02-10 NOTE — Telephone Encounter (Signed)
Call patient [name] [name] refilled his tramadol.  Please remind him that he needs an appointment every 3 to 4 months to be maintained on this medication since a controlled substance.  He will need to move up his June appointment with me.  Please make for him.  [name] [name] looked up patient on [place] Controlled Substances Reporting System and saw no activity that raised concern of inappropriate use.

## 2018-02-12 ENCOUNTER — Ambulatory Visit (INDEPENDENT_AMBULATORY_CARE_PROVIDER_SITE_OTHER): Payer: Medicare Other | Admitting: Urology

## 2018-02-12 ENCOUNTER — Encounter: Payer: Self-pay | Admitting: Urology

## 2018-02-12 DIAGNOSIS — N2889 Other specified disorders of kidney and ureter: Secondary | ICD-10-CM | POA: Diagnosis not present

## 2018-02-12 NOTE — Telephone Encounter (Signed)
Patient scheduled for 02/23/18.

## 2018-02-12 NOTE — Progress Notes (Signed)
   02/12/2018 12:28 PM   [name] [name] Mar 11, 1940 [id number]  Reason for visit: Follow up nephrolithiasis, right renal cyst  HPI: I saw [name] [name] in urology clinic in follow-up for nephrolithiasis and a small right renal cyst.  He is a 78 year old male with a extensive history of recurrent stone disease.  He most recently underwent right-sided ureteroscopy, laser lithotripsy, and stent placement on 01/02/2018, and stent removal on 01/15/2018.  Stone type was calcium oxalate monohydrate.  His follow-up ultrasound on 02/05/2018 showed no hydronephrosis and non-obstructive stone disease.  He is doing well postoperatively and denies any complaints.  He did bring with him a 24-hour urine collection from 2013.  This demonstrates elevated oxalate of 44, low urine volume of 1.7 L, elevated urinary sodium of 195, and high urinary citrate of 750.  He was also previously followed by Dr. Junious Silk for a right 1.4 cm Bosniak 56F renal cyst.  His last MRI was June 2019 and showed a stable 1.4 cm complex lower pole right renal cyst with several thin internal septations.  This was a Bosniak 56F lesion.  We discussed general stone prevention strategies including adequate hydration with goal of producing 2.5 L of urine daily, increasing citric acid intake, increasing calcium intake during high oxalate meals, minimizing animal protein, and decreasing salt intake.   -RTC 6 months with MRI abdomen for right renal Bosniak 56F cyst surveillance, consider transitioning to yearly ultrasounds if stable -KUB in 6 months for nephrolithiasis  A total of 15 minutes were spent face-to-face with the patient, greater than 50% was spent in patient education, counseling, and coordination of care regarding nephrolithiasis, stone prevention, and surveillance for right renal cyst.   [name], MD  [facility] [address], [place], [place] [address] [phone]

## 2018-02-12 NOTE — Patient Instructions (Signed)
Dietary Guidelines to Help Prevent Kidney Stones Kidney stones are deposits of minerals and salts that form inside your kidneys. Your risk of developing kidney stones may be greater depending on your diet, your lifestyle, the medicines you take, and whether you have certain medical conditions. Most people can reduce their chances of developing kidney stones by following the instructions below. Depending on your overall health and the type of kidney stones you tend to develop, your dietitian may give you more specific instructions. What are tips for following this plan? Reading food labels  Choose foods with "no salt added" or "low-salt" labels. Limit your sodium intake to less than 1500 mg per day.  Choose foods with calcium for each meal and snack. Try to eat about 300 mg of calcium at each meal. Foods that contain 200-500 mg of calcium per serving include: ? 8 oz (237 ml) of milk, fortified nondairy milk, and fortified fruit juice. ? 8 oz (237 ml) of kefir, yogurt, and soy yogurt. ? 4 oz (118 ml) of tofu. ? 1 oz of cheese. ? 1 cup (300 g) of dried figs. ? 1 cup (91 g) of cooked broccoli. ? 1-3 oz can of sardines or mackerel.  Most people need 1000 to 1500 mg of calcium each day. Talk to your dietitian about how much calcium is recommended for you. Shopping  Buy plenty of fresh fruits and vegetables. Most people do not need to avoid fruits and vegetables, even if they contain nutrients that may contribute to kidney stones.  When shopping for convenience foods, choose: ? Whole pieces of fruit. ? Premade salads with dressing on the side. ? Low-fat fruit and yogurt smoothies.  Avoid buying frozen meals or prepared deli foods.  Look for foods with live cultures, such as yogurt and kefir. Cooking  Do not add salt to food when cooking. Place a salt shaker on the table and allow each person to add his or her own salt to taste.  Use vegetable protein, such as beans, textured vegetable  protein (TVP), or tofu instead of meat in pasta, casseroles, and soups. Meal planning   Eat less salt, if told by your dietitian. To do this: ? Avoid eating processed or premade food. ? Avoid eating fast food.  Eat less animal protein, including cheese, meat, poultry, or fish, if told by your dietitian. To do this: ? Limit the number of times you have meat, poultry, fish, or cheese each week. Eat a diet free of meat at least 2 days a week. ? Eat only one serving each day of meat, poultry, fish, or seafood. ? When you prepare animal protein, cut pieces into small portion sizes. For most meat and fish, one serving is about the size of one deck of cards.  Eat at least 5 servings of fresh fruits and vegetables each day. To do this: ? Keep fruits and vegetables on hand for snacks. ? Eat 1 piece of fruit or a handful of berries with breakfast. ? Have a salad and fruit at lunch. ? Have two kinds of vegetables at dinner.  Limit foods that are high in a substance called oxalate. These include: ? Spinach. ? Rhubarb. ? Beets. ? Potato chips and french fries. ? Nuts.  If you regularly take a diuretic medicine, make sure to eat at least 1-2 fruits or vegetables high in potassium each day. These include: ? Avocado. ? Banana. ? Orange, prune, carrot, or tomato juice. ? Baked potato. ? Cabbage. ? Beans and split   If you regularly take a diuretic medicine, make sure to eat at least 1-2 fruits or vegetables high in potassium each day. These include:  ? Avocado.  ? Banana.  ? Orange, prune, carrot, or tomato juice.  ? Baked potato.  ? Cabbage.  ? Beans and split peas.  General instructions     Drink enough fluid to keep your urine clear or pale yellow. This is the most important thing you can do.   Talk to your health care provider and dietitian about taking daily supplements. Depending on your health and the cause of your kidney stones, you may be advised:  ? Not to take supplements with vitamin C.  ? To take a calcium supplement.  ? To take a daily probiotic supplement.  ? To take other supplements such as magnesium, fish oil, or vitamin B6.   Take all medicines and supplements as told by your health care provider.   Limit alcohol intake to no  more than 1 drink a day for nonpregnant women and 2 drinks a day for men. One drink equals 12 oz of beer, 5 oz of wine, or 1 oz of hard liquor.   Lose weight if told by your health care provider. Work with your dietitian to find strategies and an eating plan that works best for you.  What foods are not recommended?  Limit your intake of the following foods, or as told by your dietitian. Talk to your dietitian about specific foods you should avoid based on the type of kidney stones and your overall health.  Grains  Breads. Bagels. Rolls. Baked goods. Salted crackers. Cereal. Pasta.  Vegetables  Spinach. Rhubarb. Beets. Canned vegetables. Pickles. Olives.  Meats and other protein foods  Nuts. Nut butters. Large portions of meat, poultry, or fish. Salted or cured meats. Deli meats. Hot dogs. Sausages.  Dairy  Cheese.  Beverages  Regular soft drinks. Regular vegetable juice.  Seasonings and other foods  Seasoning blends with salt. Salad dressings. Canned soups. Soy sauce. Ketchup. Barbecue sauce. Canned pasta sauce. Casseroles. Pizza. Lasagna. Frozen meals. Potato chips. French fries.  Summary   You can reduce your risk of kidney stones by making changes to your diet.   The most important thing you can do is drink enough fluid. You should drink enough fluid to keep your urine clear or pale yellow.   Ask your health care provider or dietitian how much protein from animal sources you should eat each day, and also how much salt and calcium you should have each day.  This information is not intended to replace advice given to you by your health care provider. Make sure you discuss any questions you have with your health care provider.  Document Released: 04/27/2010 Document Revised: 12/12/2015 Document Reviewed: 12/12/2015  Elsevier Interactive Patient Education  2019 [employer] Inc[employer]

## 2018-02-13 ENCOUNTER — Other Ambulatory Visit: Payer: Self-pay | Admitting: Family

## 2018-02-13 ENCOUNTER — Other Ambulatory Visit: Payer: Self-pay | Admitting: Physician Assistant

## 2018-02-13 DIAGNOSIS — E118 Type 2 diabetes mellitus with unspecified complications: Secondary | ICD-10-CM

## 2018-02-14 ENCOUNTER — Other Ambulatory Visit: Payer: Self-pay | Admitting: Family

## 2018-02-19 ENCOUNTER — Other Ambulatory Visit: Payer: Self-pay | Admitting: Family

## 2018-02-23 ENCOUNTER — Ambulatory Visit (INDEPENDENT_AMBULATORY_CARE_PROVIDER_SITE_OTHER): Payer: Medicare Other | Admitting: Family

## 2018-02-23 ENCOUNTER — Encounter: Payer: Self-pay | Admitting: Family

## 2018-02-23 VITALS — BP 122/70 | HR 73 | Temp 97.9°F | Wt 206.4 lb

## 2018-02-23 DIAGNOSIS — M545 Low back pain, unspecified: Secondary | ICD-10-CM

## 2018-02-23 DIAGNOSIS — E782 Mixed hyperlipidemia: Secondary | ICD-10-CM

## 2018-02-23 DIAGNOSIS — N2889 Other specified disorders of kidney and ureter: Secondary | ICD-10-CM

## 2018-02-23 DIAGNOSIS — I10 Essential (primary) hypertension: Secondary | ICD-10-CM

## 2018-02-23 DIAGNOSIS — K219 Gastro-esophageal reflux disease without esophagitis: Secondary | ICD-10-CM | POA: Diagnosis not present

## 2018-02-23 DIAGNOSIS — E118 Type 2 diabetes mellitus with unspecified complications: Secondary | ICD-10-CM | POA: Diagnosis not present

## 2018-02-23 DIAGNOSIS — G8929 Other chronic pain: Secondary | ICD-10-CM | POA: Diagnosis not present

## 2018-02-23 DIAGNOSIS — G4700 Insomnia, unspecified: Secondary | ICD-10-CM | POA: Diagnosis not present

## 2018-02-23 NOTE — Assessment & Plan Note (Signed)
Controlled. Continue regimen 

## 2018-02-23 NOTE — Assessment & Plan Note (Signed)
Asymptomatic. High dose PPI. Messaged dr [name] in regards to EGD and pathology ( ? [place]) for advice as to whether patient should stay on medication. Patient will let me know if doesn't hear back from me regarding this.

## 2018-02-23 NOTE — Progress Notes (Signed)
Subjective:    Patient ID: [name] [name], male    DOB: 1940/08/28, 78 y.o.   MRN: [id number]  CC: Jkwon Treptow is a 78 y.o. male who presents today for follow up.   HPI: Doing well today  No concerns.   Insomnia- off of  Klonopin for 2 weeks. Doing well with sleep. Will awake with urination, otherwise sleeping well.    GERD- No symptoms. Started protonix 40mg  BID after EGD 2018. No known h/o Barretts.   HTN- compliant  With medication. No Cp.   Low back pain- doing well tramadol BID.May take ibuprofen on rare occasion. No falls.   HLD-compliant with lipitor  Dm- doing well on glipizide. Hasnt been checking blood sugar.   Dr Gaynelle Cage laser lithotripsy, stent placement 01/02/2018 and stent removal 01/15/2018.  To return in 6 months for MRI abdomen right renal Bosniak 43F cyst surveillance.  KUB in 6 months for nephrolithiasis  US renal 1/22/20no hydronephrosis. Benign appearing bilateral cysts.   HISTORY:  Past Medical History:  Diagnosis Date  . Aortic insufficiency    a. noted on TTE 2015  . Arthritis   . CAD (coronary artery disease)    a. remote PCI in 1991 and 2005; b. MV 3/15: old inferior MI, no ischemia, LVEF 50%, slight inferior wall hypokniesis  . Chicken pox   . Colon polyps    4 pre-cancerous   . Diverticulitis   . DM type 2 (diabetes mellitus, type 2) ([place])   . Family history of adverse reaction to anesthesia   . GERD (gastroesophageal reflux disease)   . Heart murmur   . History of kidney stones   . Hyperlipidemia   . Hypertension   . Kidney stones   . Melanoma ([place]) 1980   Resected from his back  . Mitral regurgitation    a. noted on TTE 2015  . Myocardial infarction ([place])   . Systolic dysfunction    a. TTE 2015: EF  50-55%, mild global hypokinesis, mild to moderate aortic sclerosis, mild aortic insufficiency, mild to moderate mitral regurgitation, mild tricuspid regurgitation, moderately dilated left atrium, mildly dilated right  ventricle, PASP 31 mmHg   Past Surgical History:  Procedure Laterality Date  . CHOLECYSTECTOMY  2012  . COLONOSCOPY    . CORONARY ANGIOPLASTY WITH STENT PLACEMENT  1991 & 2005  . CYSTOSCOPY/URETEROSCOPY/HOLMIUM LASER/STENT PLACEMENT Right 01/02/2018   Procedure: CYSTOSCOPY/URETEROSCOPY/HOLMIUM LASER/STENT PLACEMENT;  Surgeon: [name], MD;  Location: [facility];  Service: Urology;  Laterality: Right;  . ESOPHAGOGASTRODUODENOSCOPY (EGD) WITH PROPOFOL N/A 11/19/2016   Procedure: ESOPHAGOGASTRODUODENOSCOPY (EGD) WITH PROPOFOL;  Surgeon: [name], MD;  Location: [facility];  Service: Endoscopy;  Laterality: N/A;  . LITHOTRIPSY  2015  . MELANOMA EXCISION  1980   malignant  . NERVE SURGERY  2015   ulna nerve  . TONSILLECTOMY  1945  . WISDOM TOOTH EXTRACTION     Family History  Problem Relation Age of Onset  . Hyperlipidemia Mother   . Hypertension Mother   . Heart disease Mother   . Diabetes Mother   . Colon cancer Father   . Lung cancer Father   . Kidney cancer Father        malignant capsulated kidney tumor  . Diabetes Father   . Bladder Cancer Neg Hx   . Prostate cancer Neg Hx     Allergies: Azithromycin Current Outpatient Medications on File Prior to Visit  Medication Sig Dispense Refill  . acetaminophen (TYLENOL) 650 MG CR tablet Take 650  mg by mouth every 8 (eight) hours as needed for pain.    [name] aspirin EC 81 MG tablet Take 81 mg by mouth daily.     [name] atorvastatin (LIPITOR) 80 MG tablet TAKE 1 TABLET(80 MG) BY MOUTH DAILY (Patient taking differently: Take 80 mg by mouth daily with supper. ) 90 tablet 1  . carvedilol (COREG) 3.125 MG tablet TAKE 1 TABLET(3.125 MG) BY MOUTH TWICE DAILY 180 tablet 0  . CONTOUR TEST test strip USE AS DIRECTED 100 each 0  . fenofibrate 160 MG tablet TAKE 1 TABLET BY MOUTH EVERY DAY 90 tablet 0  . fluticasone (FLONASE) 50 MCG/ACT nasal spray Place 2 sprays into both nostrils daily. (Patient taking differently: Place 2 sprays into  both nostrils daily as needed for allergies. ) 16 g 6  . glipiZIDE (GLUCOTROL XL) 5 MG 24 hr tablet TAKE 1 TABLET BY MOUTH DAILY WITH BREAKFAST 90 tablet 1  . glucose blood test strip Use as instructed to check blood sugar up to three times daily. E11.42 100 each 12  . hydrochlorothiazide (HYDRODIURIL) 25 MG tablet TAKE 1 TABLET(25 MG) BY MOUTH DAILY 90 tablet 0  . lisinopril (PRINIVIL,ZESTRIL) 10 MG tablet TAKE 1 TABLET BY MOUTH DAILY 90 tablet 1  . Multiple Vitamin (MULTIVITAMIN) capsule Take 1 capsule by mouth daily with lunch.     . naproxen sodium (ALEVE) 220 MG tablet Take 220 mg by mouth 2 (two) times daily as needed (for pain or headache).    . Omega-3 Fatty Acids (FISH OIL PO) Take 1 capsule by mouth daily.     . pantoprazole (PROTONIX) 40 MG tablet Take 1 tablet (40 mg total) by mouth 2 (two) times daily. 60 tablet 6  . Polyvinyl Alcohol-Povidone (MURINE TEARS FOR DRY EYES OP) Place 1-2 drops into both eyes daily as needed (for dry eyes).    . psyllium (METAMUCIL) 58.6 % powder Take 1 packet by mouth at bedtime.     . tamsulosin (FLOMAX) 0.4 MG CAPS capsule TAKE 1 CAPSULE(0.4 MG) BY MOUTH DAILY (Patient taking differently: Take 0.4 mg by mouth daily. ) 90 capsule 11  . traMADol (ULTRAM) 50 MG tablet TAKE 1 TABLET(50 MG) BY MOUTH EVERY 12 HOURS AS NEEDED 60 tablet 1   No current facility-administered medications on file prior to visit.     Social History   Tobacco Use  . Smoking status: Current Some Day Smoker    Types: Pipe  . Smokeless tobacco: Former Systems developer    Quit date: 10/17/2015  Substance Use Topics  . Alcohol use: Yes    Alcohol/week: 0.0 - 1.0 standard drinks    Comment: none last 24hrs  . Drug use: No    Review of Systems  Constitutional: Negative for chills and fever.  Respiratory: Negative for cough.   Cardiovascular: Negative for chest pain and palpitations.  Gastrointestinal: Negative for nausea and vomiting.  Psychiatric/Behavioral: Negative for sleep  disturbance.      Objective:    BP 122/70 (BP Location: Left Arm, Patient Position: Sitting, Cuff Size: Large)   Pulse 73   Temp 97.9 F (36.6 C)   Wt 206 lb 6.4 oz (93.6 kg)   SpO2 95%   BMI 27.23 kg/m  BP Readings from Last 3 Encounters:  02/23/18 122/70  02/12/18 (!) 146/70  01/15/18 95/64   Wt Readings from Last 3 Encounters:  02/23/18 206 lb 6.4 oz (93.6 kg)  02/12/18 205 lb (93 kg)  01/15/18 206 lb (93.4 kg)  Physical Exam Vitals signs reviewed.  Constitutional:      Appearance: He is well-developed.  Cardiovascular:     Rate and Rhythm: Regular rhythm.     Heart sounds: Normal heart sounds.  Pulmonary:     Effort: Pulmonary effort is normal. No respiratory distress.     Breath sounds: Normal breath sounds. No wheezing, rhonchi or rales.  Skin:    General: Skin is warm and dry.  Neurological:     Mental Status: He is alert.  Psychiatric:        Speech: Speech normal.        Behavior: Behavior normal.        Assessment & Plan:   Problem List Items Addressed This Visit      Cardiovascular and Mediastinum   Essential hypertension - Primary    Controlled. Continue regimen      Relevant Orders   Comprehensive metabolic panel     Digestive   GERD (gastroesophageal reflux disease)    Asymptomatic. High dose PPI. Messaged dr [name] in regards to EGD and pathology ( ? [place]) for advice as to whether patient should stay on medication. Patient will let me know if doesn't hear back from me regarding this.         Endocrine   Controlled type 2 diabetes mellitus with complication, without long-term current use of insulin (HCC)    Pending a1c      Relevant Orders   Hemoglobin A1c     Other   Mixed hyperlipidemia   Relevant Orders   Lipid panel   Chronic low back pain    Doing well on tramadol. Will continue.       Insomnia    No longer on klonopin, doing well. Will follow      Right renal mass    Follows with [facility]; pending repeat MR  Abdomen.           I have discontinued [name] "Ben"'s clonazePAM. I am also having him maintain his aspirin EC, fluticasone, Omega-3 Fatty Acids (FISH OIL PO), multivitamin, psyllium, glucose blood, pantoprazole, CONTOUR TEST, atorvastatin, tamsulosin, naproxen sodium, Polyvinyl Alcohol-Povidone (MURINE TEARS FOR DRY EYES OP), acetaminophen, fenofibrate, traMADol, lisinopril, glipiZIDE, carvedilol, and hydrochlorothiazide.   No orders of the defined types were placed in this encounter.   Return precautions given.   Risks, benefits, and alternatives of the medications and treatment plan prescribed today were discussed, and patient expressed understanding.   Education regarding symptom management and diagnosis given to patient on AVS.  Continue to follow with [name], [name] for routine health maintenance.   [name] and I agreed with plan.   [name], [name]

## 2018-02-23 NOTE — Assessment & Plan Note (Signed)
No longer on klonopin, doing well. Will follow

## 2018-02-23 NOTE — Patient Instructions (Addendum)
Please ensure you follow-up with me if you have not heard from me in regards to Dr. Dorothey Baseman response in terms of the pantoprazole.  Fasting labs in couple of weeks

## 2018-02-23 NOTE — Assessment & Plan Note (Signed)
Doing well on tramadol. Will continue.

## 2018-02-23 NOTE — Assessment & Plan Note (Signed)
Follows with [facility]; pending repeat MR Abdomen.

## 2018-02-23 NOTE — Assessment & Plan Note (Signed)
Pending a1c. 

## 2018-03-02 ENCOUNTER — Other Ambulatory Visit (INDEPENDENT_AMBULATORY_CARE_PROVIDER_SITE_OTHER): Payer: Medicare Other

## 2018-03-02 DIAGNOSIS — E118 Type 2 diabetes mellitus with unspecified complications: Secondary | ICD-10-CM | POA: Diagnosis not present

## 2018-03-02 DIAGNOSIS — E782 Mixed hyperlipidemia: Secondary | ICD-10-CM | POA: Diagnosis not present

## 2018-03-02 DIAGNOSIS — I10 Essential (primary) hypertension: Secondary | ICD-10-CM | POA: Diagnosis not present

## 2018-03-02 LAB — COMPREHENSIVE METABOLIC PANEL
ALT: 30 U/L (ref 0–53)
AST: 28 U/L (ref 0–37)
Albumin: 4.3 g/dL (ref 3.5–5.2)
Alkaline Phosphatase: 31 U/L — ABNORMAL LOW (ref 39–117)
BUN: 19 mg/dL (ref 6–23)
CO2: 26 mEq/L (ref 19–32)
Calcium: 9.8 mg/dL (ref 8.4–10.5)
Chloride: 103 mEq/L (ref 96–112)
Creatinine, Ser: 0.86 mg/dL (ref 0.40–1.50)
GFR: 85.98 mL/min (ref >60.00)
Glucose, Bld: 151 mg/dL — ABNORMAL HIGH (ref 70–99)
Potassium: 3.7 mEq/L (ref 3.5–5.1)
Sodium: 139 mEq/L (ref 135–145)
Total Bilirubin: 0.9 mg/dL (ref 0.2–1.2)
Total Protein: 6.7 g/dL (ref 6.0–8.3)

## 2018-03-02 LAB — HEMOGLOBIN A1C: Hgb A1c MFr Bld: 7.4 % — ABNORMAL HIGH (ref 4.6–6.5)

## 2018-03-02 LAB — LIPID PANEL
Cholesterol: 136 mg/dL (ref 0–200)
HDL: 36.5 mg/dL — ABNORMAL LOW (ref >39.00)
LDL Cholesterol: 70 mg/dL (ref 0–99)
NonHDL: 99.06
Total CHOL/HDL Ratio: 4
Triglycerides: 143 mg/dL (ref 0.0–149.0)
VLDL: 28.6 mg/dL (ref 0.0–40.0)

## 2018-03-06 ENCOUNTER — Ambulatory Visit (INDEPENDENT_AMBULATORY_CARE_PROVIDER_SITE_OTHER): Payer: Medicare Other | Admitting: Family

## 2018-03-06 ENCOUNTER — Encounter: Payer: Self-pay | Admitting: Family

## 2018-03-06 VITALS — BP 116/74 | HR 57 | Temp 97.5°F | Wt 207.0 lb

## 2018-03-06 DIAGNOSIS — J0100 Acute maxillary sinusitis, unspecified: Secondary | ICD-10-CM | POA: Diagnosis not present

## 2018-03-06 DIAGNOSIS — E118 Type 2 diabetes mellitus with unspecified complications: Secondary | ICD-10-CM | POA: Diagnosis not present

## 2018-03-06 DIAGNOSIS — K219 Gastro-esophageal reflux disease without esophagitis: Secondary | ICD-10-CM | POA: Diagnosis not present

## 2018-03-06 MED ORDER — AMOXICILLIN-POT CLAVULANATE 875-125 MG PO TABS: 1.0000 | ORAL_TABLET | Freq: Two times a day (BID) | ORAL | 0 refills | Status: DC

## 2018-03-06 NOTE — Patient Instructions (Addendum)
Esophagitis seen on your EGD in 11/2016. I would advise to stay on the protonix  Plain mucinex; stop Mucinex-D.  Start augmentin  Ensure to take probiotics while on antibiotics and also for 2 weeks after completion. It is important to re-colonize the gut with good bacteria and also to prevent any diarrheal infections associated with antibiotic use.   Let me know if not better

## 2018-03-06 NOTE — Progress Notes (Signed)
Subjective:    Patient ID: [name] [name], male    DOB: May 05, 1940, 78 y.o.   MRN: [id number]  CC: [name] [name] is a 78 y.o. male who presents today for an acute visit.    HPI: CC: sinus pressure, cough, sore throat x 2 week, waxed and waned.   Clear congestion. Had felt a occasional wheeze, since resolved.   Feels like glands on both sides of neck are swollen, tender.  No fever, chills, body aches.   Has been on mucinex D.   GERD- on protonix 40 QD.  No breakthrough symptoms, trouble swallowing.  DM- 2 months ago, sugar was increased with kidney stone. Would like to pursue diet changes versus increasing medication.      Esophagitis EGD 11/2016  HISTORY:  Past Medical History:  Diagnosis Date  . Aortic insufficiency    a. noted on TTE 2015  . Arthritis   . CAD (coronary artery disease)    a. remote PCI in 1991 and 2005; b. MV 3/15: old inferior MI, no ischemia, LVEF 50%, slight inferior wall hypokniesis  . Chicken pox   . Colon polyps    4 pre-cancerous   . Diverticulitis   . DM type 2 (diabetes mellitus, type 2) ([place])   . Family history of adverse reaction to anesthesia   . GERD (gastroesophageal reflux disease)   . Heart murmur   . History of kidney stones   . Hyperlipidemia   . Hypertension   . Kidney stones   . Melanoma ([place]) 1980   Resected from his back  . Mitral regurgitation    a. noted on TTE 2015  . Myocardial infarction ([place])   . Systolic dysfunction    a. TTE 2015: EF  50-55%, mild global hypokinesis, mild to moderate aortic sclerosis, mild aortic insufficiency, mild to moderate mitral regurgitation, mild tricuspid regurgitation, moderately dilated left atrium, mildly dilated right ventricle, PASP 31 mmHg   Past Surgical History:  Procedure Laterality Date  . CHOLECYSTECTOMY  2012  . COLONOSCOPY    . CORONARY ANGIOPLASTY WITH STENT PLACEMENT  1991 & 2005  . CYSTOSCOPY/URETEROSCOPY/HOLMIUM LASER/STENT PLACEMENT Right 01/02/2018   Procedure: CYSTOSCOPY/URETEROSCOPY/HOLMIUM LASER/STENT PLACEMENT;  Surgeon: [name], MD;  Location: [facility];  Service: Urology;  Laterality: Right;  . ESOPHAGOGASTRODUODENOSCOPY (EGD) WITH PROPOFOL N/A 11/19/2016   Procedure: ESOPHAGOGASTRODUODENOSCOPY (EGD) WITH PROPOFOL;  Surgeon: [name], MD;  Location: [facility];  Service: Endoscopy;  Laterality: N/A;  . LITHOTRIPSY  2015  . MELANOMA EXCISION  1980   malignant  . NERVE SURGERY  2015   ulna nerve  . TONSILLECTOMY  1945  . WISDOM TOOTH EXTRACTION     Family History  Problem Relation Age of Onset  . Hyperlipidemia Mother   . Hypertension Mother   . Heart disease Mother   . Diabetes Mother   . Colon cancer Father   . Lung cancer Father   . Kidney cancer Father        malignant capsulated kidney tumor  . Diabetes Father   . Bladder Cancer Neg Hx   . Prostate cancer Neg Hx     Allergies: Azithromycin Current Outpatient Medications on File Prior to Visit  Medication Sig Dispense Refill  . acetaminophen (TYLENOL) 650 MG CR tablet Take 650 mg by mouth every 8 (eight) hours as needed for pain.    [name] aspirin EC 81 MG tablet Take 81 mg by mouth daily.     [name] atorvastatin (LIPITOR) 80 MG tablet TAKE 1 TABLET(80  MG) BY MOUTH DAILY (Patient taking differently: Take 80 mg by mouth daily with supper. ) 90 tablet 1  . carvedilol (COREG) 3.125 MG tablet TAKE 1 TABLET(3.125 MG) BY MOUTH TWICE DAILY 180 tablet 0  . CONTOUR TEST test strip USE AS DIRECTED 100 each 0  . fenofibrate 160 MG tablet TAKE 1 TABLET BY MOUTH EVERY DAY 90 tablet 0  . fluticasone (FLONASE) 50 MCG/ACT nasal spray Place 2 sprays into both nostrils daily. (Patient taking differently: Place 2 sprays into both nostrils daily as needed for allergies. ) 16 g 6  . glipiZIDE (GLUCOTROL XL) 5 MG 24 hr tablet TAKE 1 TABLET BY MOUTH DAILY WITH BREAKFAST 90 tablet 1  . glucose blood test strip Use as instructed to check blood sugar up to three times daily. E11.42 100  each 12  . hydrochlorothiazide (HYDRODIURIL) 25 MG tablet TAKE 1 TABLET(25 MG) BY MOUTH DAILY 90 tablet 0  . lisinopril (PRINIVIL,ZESTRIL) 10 MG tablet TAKE 1 TABLET BY MOUTH DAILY 90 tablet 1  . Multiple Vitamin (MULTIVITAMIN) capsule Take 1 capsule by mouth daily with lunch.     . naproxen sodium (ALEVE) 220 MG tablet Take 220 mg by mouth 2 (two) times daily as needed (for pain or headache).    . Omega-3 Fatty Acids (FISH OIL PO) Take 1 capsule by mouth daily.     . pantoprazole (PROTONIX) 40 MG tablet Take 1 tablet (40 mg total) by mouth 2 (two) times daily. 60 tablet 6  . Polyvinyl Alcohol-Povidone (MURINE TEARS FOR DRY EYES OP) Place 1-2 drops into both eyes daily as needed (for dry eyes).    . pseudoephedrine-guaifenesin (MUCINEX D) 60-600 MG 12 hr tablet Take 1 tablet by mouth every 12 (twelve) hours.    . psyllium (METAMUCIL) 58.6 % powder Take 1 packet by mouth at bedtime.     . tamsulosin (FLOMAX) 0.4 MG CAPS capsule TAKE 1 CAPSULE(0.4 MG) BY MOUTH DAILY (Patient taking differently: Take 0.4 mg by mouth daily. ) 90 capsule 11  . traMADol (ULTRAM) 50 MG tablet TAKE 1 TABLET(50 MG) BY MOUTH EVERY 12 HOURS AS NEEDED 60 tablet 1   No current facility-administered medications on file prior to visit.     Social History   Tobacco Use  . Smoking status: Current Some Day Smoker    Types: Pipe  . Smokeless tobacco: Former Systems developer    Quit date: 10/17/2015  Substance Use Topics  . Alcohol use: Yes    Alcohol/week: 0.0 - 1.0 standard drinks    Comment: none last 24hrs  . Drug use: No    Review of Systems  Constitutional: Negative for chills and fever.  HENT: Positive for congestion, sinus pressure and sore throat (improved). Negative for ear pain and trouble swallowing.   Respiratory: Positive for cough. Negative for shortness of breath and wheezing.   Cardiovascular: Negative for chest pain and palpitations.  Gastrointestinal: Negative for nausea and vomiting.      Objective:    BP  116/74 (BP Location: Left Arm, Patient Position: Sitting, Cuff Size: Large)   Pulse (!) 57   Temp (!) 97.5 F (36.4 C)   Wt 207 lb (93.9 kg)   SpO2 98%   BMI 27.31 kg/m    Physical Exam Vitals signs reviewed.  Constitutional:      Appearance: He is well-developed.  HENT:     Head: Normocephalic and atraumatic.     Right Ear: Hearing, tympanic membrane, ear canal and external ear normal. No decreased  hearing noted. No drainage, swelling or tenderness. No middle ear effusion. Tympanic membrane is not injected, erythematous or bulging.     Left Ear: Hearing, tympanic membrane, ear canal and external ear normal. No decreased hearing noted. No drainage, swelling or tenderness.  No middle ear effusion. Tympanic membrane is not injected, erythematous or bulging.     Nose: Nose normal.     Right Sinus: No maxillary sinus tenderness or frontal sinus tenderness.     Left Sinus: No maxillary sinus tenderness or frontal sinus tenderness.     Mouth/Throat:     Pharynx: Uvula midline. No oropharyngeal exudate or posterior oropharyngeal erythema.     Tonsils: No tonsillar abscesses.  Eyes:     Conjunctiva/sclera: Conjunctivae normal.  Cardiovascular:     Rate and Rhythm: Regular rhythm.     Heart sounds: Normal heart sounds.  Pulmonary:     Effort: Pulmonary effort is normal. No respiratory distress.     Breath sounds: Normal breath sounds. No wheezing, rhonchi or rales.  Lymphadenopathy:     Head:     Right side of head: Submandibular adenopathy present. No submental, tonsillar, preauricular, posterior auricular or occipital adenopathy.     Left side of head: Submandibular adenopathy present. No submental, tonsillar, preauricular, posterior auricular or occipital adenopathy.     Cervical: No cervical adenopathy.     Comments: Symmetrically enlarged, slightly tender bilateral submandibular lymph nodes.  Nonfluctuant.  Skin:    General: Skin is warm and dry.  Neurological:     Mental Status:  He is alert.  Psychiatric:        Speech: Speech normal.        Behavior: Behavior normal.        Assessment & Plan:   Problem List Items Addressed This Visit      Respiratory   Acute non-recurrent maxillary sinusitis - Primary    No acute respiratory distress, SaO2 90%.  Based on duration of symptoms, reasonable to treat with antibiotic therapy.  Patient will let me know no improvement of symptoms including tender lymph nodes.      Relevant Medications   pseudoephedrine-guaifenesin (MUCINEX D) 60-600 MG 12 hr tablet   amoxicillin-clavulanate (AUGMENTIN) 875-125 MG tablet     Digestive   GERD (gastroesophageal reflux disease)    Patient on NSAIDs, esophagitis seen on EGD.  Discussed with him advised to stay on Protonix 40 mg QD as also advised by gastroenterologist, Dr. [name] via [place] message.         Endocrine   Controlled type 2 diabetes mellitus with complication, without long-term current use of insulin ([place])    Not at goal, will pursue lifestyle modifications.  Will follow.           I am having [name] [name] [address] start on amoxicillin-clavulanate. I am also having him maintain his aspirin EC, fluticasone, Omega-3 Fatty Acids (FISH OIL PO), multivitamin, psyllium, glucose blood, pantoprazole, CONTOUR TEST, atorvastatin, tamsulosin, naproxen sodium, Polyvinyl Alcohol-Povidone (MURINE TEARS FOR DRY EYES OP), acetaminophen, fenofibrate, traMADol, lisinopril, glipiZIDE, carvedilol, hydrochlorothiazide, and pseudoephedrine-guaifenesin.   Meds ordered this encounter  Medications  . amoxicillin-clavulanate (AUGMENTIN) 875-125 MG tablet    Sig: Take 1 tablet by mouth 2 (two) times daily.    Dispense:  20 tablet    Refill:  0    Order Specific Question:   Supervising Provider    Answer:   Crecencio Mc [[id number]]    Return precautions given.   Risks, benefits, and alternatives of the  medications and treatment plan prescribed today were discussed, and patient  expressed understanding.   Education regarding symptom management and diagnosis given to patient on AVS.  Continue to follow with [name], [name] for routine health maintenance.   [name] and I agreed with plan.   [name], [name]

## 2018-03-09 ENCOUNTER — Other Ambulatory Visit: Payer: Self-pay

## 2018-03-09 NOTE — Assessment & Plan Note (Signed)
Not at goal, will pursue lifestyle modifications.  Will follow.

## 2018-03-09 NOTE — Assessment & Plan Note (Signed)
Patient on NSAIDs, esophagitis seen on EGD.  Discussed with him advised to stay on Protonix 40 mg QD as also advised by gastroenterologist, Dr. [name] via [place] message.

## 2018-03-09 NOTE — Assessment & Plan Note (Signed)
No acute respiratory distress, SaO2 90%.  Based on duration of symptoms, reasonable to treat with antibiotic therapy.  Patient will let me know no improvement of symptoms including tender lymph nodes.

## 2018-03-26 ENCOUNTER — Other Ambulatory Visit: Payer: Self-pay

## 2018-04-11 ENCOUNTER — Other Ambulatory Visit: Payer: Self-pay | Admitting: Family

## 2018-04-13 NOTE — Telephone Encounter (Signed)
I looked up patient on [place] Controlled Substances Reporting System and saw no activity that raised concern of inappropriate use.   

## 2018-04-16 ENCOUNTER — Other Ambulatory Visit: Payer: Self-pay

## 2018-04-16 ENCOUNTER — Ambulatory Visit: Payer: Self-pay | Admitting: Family

## 2018-04-16 ENCOUNTER — Encounter: Payer: Self-pay | Admitting: Family Medicine

## 2018-04-16 ENCOUNTER — Ambulatory Visit (INDEPENDENT_AMBULATORY_CARE_PROVIDER_SITE_OTHER): Payer: Medicare Other | Admitting: Family Medicine

## 2018-04-16 VITALS — BP 136/82 | HR 64 | Temp 98.6°F | Resp 16

## 2018-04-16 DIAGNOSIS — R509 Fever, unspecified: Secondary | ICD-10-CM | POA: Diagnosis not present

## 2018-04-16 DIAGNOSIS — R52 Pain, unspecified: Secondary | ICD-10-CM | POA: Diagnosis not present

## 2018-04-16 DIAGNOSIS — B349 Viral infection, unspecified: Secondary | ICD-10-CM | POA: Diagnosis not present

## 2018-04-16 NOTE — Telephone Encounter (Signed)
Called and spoke to Pt and wife to get the correct email address

## 2018-04-16 NOTE — Telephone Encounter (Signed)
Pt. Reports he started feeling bad yesterday - "hit me all of a sudden." Temp. Yesterday 100.2.Has chills, body aches, post nasal drip,lethargy. Was treated " couple of months ago for sinus infection." Reports he does not go out much, but is concerned about corona virus because of his other medical conditions. Temp. This morning is 98.6. Spoke with Butch Penny, and pt. Will have Web visit this morning.  Answer Assessment - Initial Assessment Questions 1. WORST SYMPTOM: "What is your worst symptom?" (e.g., cough, runny nose, muscle aches, headache, sore throat, fever)      Body aches, chills temp. 100.2 2. ONSET: "When did your flu symptoms start?"      Yesterday - hit all of a sudden 3. COUGH: "How bad is the cough?"       No cough 4. RESPIRATORY DISTRESS: "Describe your breathing."      No distress 5. FEVER: "Do you have a fever?" If so, ask: "What is your temperature, how was it measured, and when did it start?"     98.6 this morning 6. EXPOSURE: "Were you exposed to someone with influenza?"       No 7. FLU VACCINE: "Did you get a flu shot this year?"     Yes 8. HIGH RISK DISEASE: "Do you any chronic medical problems?" (e.g., heart or lung disease, asthma, weak immune system, or other HIGH RISK conditions)     Diabetic, heart history 9. PREGNANCY: "Is there any chance you are pregnant?" "When was your last menstrual period?"     n/a 10. OTHER SYMPTOMS: "Do you have any other symptoms?"  (e.g., runny nose, muscle aches, headache, sore throat)       Post nasal drip  Protocols used: INFLUENZA - SEASONAL-A-AH

## 2018-04-16 NOTE — Progress Notes (Signed)
Virtual Visit via Video Note  I connected with [name] [name] on 04/16/18 at  9:20 AM EDT by a video enabled telemedicine application and verified that I am speaking with the correct person using two identifiers. Location patient: home Location provider: [place] Persons participating in the virtual visit: patient, provider, patient's wife - libbie  I discussed the limitations of evaluation and management by telemedicine and the availability of in person appointments. The patient expressed understanding and agreed to proceed.   HPI:  Patient reports fever and chills, body aches and feeling rundown for the past 2 days.  Patient states his temperature yesterday was 100.8 and this morning when he took it was 98.6.  Patient states he has taken 2 doses of Aleve to reduce temperature and also control body aches.  Patient states when he checked his O2 saturation it was 90% yesterday, took a few deep breaths and then the saturation went up to 95%.  Blood sugar this morning was 167.  Patient states about a month ago he was treated with oral antibiotics by his PCP for sinus infection, states he has issues with chronic sinus infection/chronic nasal congestion and it never seems to fully go away.  Currently he is not taking any sort of allergy medication.  Denies cough, shortness of breath or wheezing.  Denies chest pain.  Denies palpitations.  Denies vomiting or nausea.  Does report having one episode of diarrhea yesterday.  Patient has not traveled outside of the [facility] area.  Denies any known exposure to a PUI or known COVID19 positive patient. Patient states he has been leaving his home EVERY DAY to go out and run errands at the grocery store and pharmacy, but has been trying to be diligent about using wipes and washing his hands.  Patient states he became concerned about possibly having novel coronavirus when he developed fever and body aches especially due to his  multiple other medical conditions including heart disease and diabetes.  ROS: See pertinent positives and negatives per HPI.  Past Medical History:  Diagnosis Date  . Aortic insufficiency    a. noted on TTE 2015  . Arthritis   . CAD (coronary artery disease)    a. remote PCI in 1991 and 2005; b. MV 3/15: old inferior MI, no ischemia, LVEF 50%, slight inferior wall hypokniesis  . Chicken pox   . Colon polyps    4 pre-cancerous   . Diverticulitis   . DM type 2 (diabetes mellitus, type 2) ([place])   . Family history of adverse reaction to anesthesia   . GERD (gastroesophageal reflux disease)   . Heart murmur   . History of kidney stones   . Hyperlipidemia   . Hypertension   . Kidney stones   . Melanoma ([place]) 1980   Resected from his back  . Mitral regurgitation    a. noted on TTE 2015  . Myocardial infarction ([place])   . Systolic dysfunction    a. TTE 2015: EF  50-55%, mild global hypokinesis, mild to moderate aortic sclerosis, mild aortic insufficiency, mild to moderate mitral regurgitation, mild tricuspid regurgitation, moderately dilated left atrium, mildly dilated right ventricle, PASP 31 mmHg    Past Surgical History:  Procedure Laterality Date  . CHOLECYSTECTOMY  2012  . COLONOSCOPY    . CORONARY ANGIOPLASTY WITH STENT PLACEMENT  1991 & 2005  . CYSTOSCOPY/URETEROSCOPY/HOLMIUM LASER/STENT PLACEMENT Right 01/02/2018   Procedure: CYSTOSCOPY/URETEROSCOPY/HOLMIUM LASER/STENT PLACEMENT;  Surgeon: [name], MD;  Location: [facility] ORS;  Service: Urology;  Laterality: Right;  . ESOPHAGOGASTRODUODENOSCOPY (EGD) WITH PROPOFOL N/A 11/19/2016   Procedure: ESOPHAGOGASTRODUODENOSCOPY (EGD) WITH PROPOFOL;  Surgeon: [name], MD;  Location: [facility] ENDOSCOPY;  Service: Endoscopy;  Laterality: N/A;  . LITHOTRIPSY  2015  . MELANOMA EXCISION  1980   malignant  . NERVE SURGERY  2015   ulna nerve  . TONSILLECTOMY  1945  . WISDOM TOOTH EXTRACTION      Family History  Problem Relation  Age of Onset  . Hyperlipidemia Mother   . Hypertension Mother   . Heart disease Mother   . Diabetes Mother   . Colon cancer Father   . Lung cancer Father   . Kidney cancer Father        malignant capsulated kidney tumor  . Diabetes Father   . Bladder Cancer Neg Hx   . Prostate cancer Neg Hx     Social History   Tobacco Use  . Smoking status: Current Some Day Smoker    Types: Pipe  . Smokeless tobacco: Former Systems developer    Quit date: 10/17/2015  Substance Use Topics  . Alcohol use: Yes    Alcohol/week: 0.0 - 1.0 standard drinks    Comment: none last 24hrs    Current Outpatient Medications:  .  acetaminophen (TYLENOL) 650 MG CR tablet, Take 650 mg by mouth every 8 (eight) hours as needed for pain., Disp: , Rfl:  .  amoxicillin-clavulanate (AUGMENTIN) 875-125 MG tablet, Take 1 tablet by mouth 2 (two) times daily., Disp: 20 tablet, Rfl: 0 .  aspirin EC 81 MG tablet, Take 81 mg by mouth daily. , Disp: , Rfl:  .  atorvastatin (LIPITOR) 80 MG tablet, TAKE 1 TABLET(80 MG) BY MOUTH DAILY (Patient taking differently: Take 80 mg by mouth daily with supper. ), Disp: 90 tablet, Rfl: 1 .  carvedilol (COREG) 3.125 MG tablet, TAKE 1 TABLET(3.125 MG) BY MOUTH TWICE DAILY, Disp: 180 tablet, Rfl: 0 .  CONTOUR TEST test strip, USE AS DIRECTED, Disp: 100 each, Rfl: 0 .  fenofibrate 160 MG tablet, TAKE 1 TABLET BY MOUTH EVERY DAY, Disp: 90 tablet, Rfl: 0 .  fluticasone (FLONASE) 50 MCG/ACT nasal spray, Place 2 sprays into both nostrils daily. (Patient taking differently: Place 2 sprays into both nostrils daily as needed for allergies. ), Disp: 16 g, Rfl: 6 .  glipiZIDE (GLUCOTROL XL) 5 MG 24 hr tablet, TAKE 1 TABLET BY MOUTH DAILY WITH BREAKFAST, Disp: 90 tablet, Rfl: 1 .  glucose blood test strip, Use as instructed to check blood sugar up to three times daily. E11.42, Disp: 100 each, Rfl: 12 .  hydrochlorothiazide (HYDRODIURIL) 25 MG tablet, TAKE 1 TABLET(25 MG) BY MOUTH DAILY, Disp: 90 tablet, Rfl: 0 .   lisinopril (PRINIVIL,ZESTRIL) 10 MG tablet, TAKE 1 TABLET BY MOUTH DAILY, Disp: 90 tablet, Rfl: 1 .  Multiple Vitamin (MULTIVITAMIN) capsule, Take 1 capsule by mouth daily with lunch. , Disp: , Rfl:  .  naproxen sodium (ALEVE) 220 MG tablet, Take 220 mg by mouth 2 (two) times daily as needed (for pain or headache)., Disp: , Rfl:  .  Omega-3 Fatty Acids (FISH OIL PO), Take 1 capsule by mouth daily. , Disp: , Rfl:  .  pantoprazole (PROTONIX) 40 MG tablet, Take 1 tablet (40 mg total) by mouth 2 (two) times daily., Disp: 60 tablet, Rfl: 6 .  Polyvinyl Alcohol-Povidone (MURINE TEARS FOR DRY EYES OP), Place 1-2 drops into both eyes daily as needed (for dry eyes)., Disp: , Rfl:  .  pseudoephedrine-guaifenesin (MUCINEX D) 60-600 MG 12 hr tablet, Take 1 tablet by mouth every 12 (twelve) hours., Disp: , Rfl:  .  psyllium (METAMUCIL) 58.6 % powder, Take 1 packet by mouth at bedtime. , Disp: , Rfl:  .  tamsulosin (FLOMAX) 0.4 MG CAPS capsule, TAKE 1 CAPSULE(0.4 MG) BY MOUTH DAILY (Patient taking differently: Take 0.4 mg by mouth daily. ), Disp: 90 capsule, Rfl: 11 .  traMADol (ULTRAM) 50 MG tablet, TAKE 1 TABLET(50 MG) BY MOUTH EVERY 12 HOURS AS NEEDED, Disp: 60 tablet, Rfl: 1  EXAM:  VITALS per patient if applicable:  BS this AM: 167  Vitals:   04/16/18 0920  BP: 136/82  Pulse: 64  Resp: 16  Temp: 98.6 F (37 C)  SpO2: 95%    GENERAL: alert, oriented, appears well and in no acute distress  HEENT: atraumatic, conjunttiva clear, no obvious abnormalities on inspection of external nose and ears  NECK: normal movements of the [name] and neck  LUNGS: on inspection no signs of respiratory distress, breathing rate appears normal, no obvious gross SOB, gasping or wheezing  CV: no obvious cyanosis  MS: moves all visible extremities without noticeable abnormality  PSYCH/NEURO: pleasant and cooperative, no obvious depression or anxiety, speech and thought processing grossly intact  ASSESSMENT AND  PLAN:  Discussed the following assessment and plan:  Systemic viral illness  Fever and chills  Generalized body aches    A total of 25 minutes were spent face-to-face via video with the patient during this encounter and over half of that time was spent on counseling and coordination of care. The patient was counseled on how to care for self, monitor his symptoms, handwashing, quarantine at home.   Patient concerned that he could have the novel coronavirus and inquires about testing.  Patient advised that since his symptoms are not severe, his best course of action is to remain in the home.  Patient advised that going to the ER, an urgent care or a testing site would only expose him to other people who are sick thus potentially increasing his risks factors of becoming more ill or being exposed to other illnesses.  Patient advised that whether or not he has a coronavirus at this time, since his symptoms are mild our treatment plan remains the same so at this point it does not matter whether or not he has the coronavirus or he has a viral illness, the treatment plan continues to be supportive care. Advised to remain home for at least 72 hours after being fever free without use of antipyretics. Advised to be very diligent about handwashing.  Advised to reduce outings to at most once per week rather than going every day once his symptoms have resolved.  Advised to practice social distancing and keep distance from others of at least 6 feet.  Patient advised to avoid use of Aleve or Advil/ibuprofen at this time due to some research possibly showing this could potentially cause worse outcomes in coronavirus patients.  Advised he can take something like Claritin to help dry up any nasal congestion, advised to use Tylenol to reduce fever and body aches, advised to keep self well-hydrated with water, Gatorade, soups, eating bland foods.  Advised he can use Imodium if needed for any diarrhea.  Patient advised  to monitor his self for any worsening of symptoms, if he develops high fever, shortness of breath, chest pain, weakness, severe vomiting or diarrhea he is to call for further instruction and or call 911 right away  for emergency treatment.  I discussed the assessment and treatment plan with the patient. The patient was provided an opportunity to ask questions and all were answered. The patient agreed with the plan and demonstrated an understanding of the instructions.   The patient was advised to call back or seek an in-person evaluation if the symptoms worsen or if the condition fails to improve as anticipated.   Jodelle Green, [name]

## 2018-04-16 NOTE — Telephone Encounter (Addendum)
This pt has been scheduled for a virtual visit for this morning at 9:20 am.  Pt will use a tablet for visit.  When I spoke to the [facility] RN this morning she said to use this pt's wife's email for the WebEx visit.  She said wife's email is listed in EPIC.

## 2018-04-20 ENCOUNTER — Telehealth: Payer: Self-pay | Admitting: Gastroenterology

## 2018-04-20 ENCOUNTER — Other Ambulatory Visit: Payer: Self-pay

## 2018-04-20 MED ORDER — PANTOPRAZOLE SODIUM 40 MG PO TBEC: 40.0000 mg | DELAYED_RELEASE_TABLET | Freq: Two times a day (BID) | ORAL | 6 refills | Status: DC

## 2018-04-20 NOTE — Telephone Encounter (Signed)
Refill for Pantoprazole has been sent to [facility]. Advised on refill, pt will need to schedule a follow up appt.

## 2018-04-20 NOTE — Telephone Encounter (Signed)
Received a fax from [address] [phone] rx PANTOPRAZOLE 40 MG TABLETS QYT 60

## 2018-05-14 ENCOUNTER — Other Ambulatory Visit: Payer: Self-pay | Admitting: Family

## 2018-05-14 ENCOUNTER — Other Ambulatory Visit: Payer: Self-pay | Admitting: Physician Assistant

## 2018-05-14 ENCOUNTER — Telehealth: Payer: Self-pay | Admitting: Cardiovascular Disease

## 2018-05-14 NOTE — Telephone Encounter (Signed)
Virtual Visit Pre-Appointment Phone Call  "(Name), I am calling you today to discuss your upcoming appointment. We are currently trying to limit exposure to the virus that causes COVID-19 by seeing patients at home rather than in the office."  1. "What is the BEST phone number to call the day of the visit?" - include this in appointment notes  2. Do you have or have access to (through a family member/friend) a smartphone with video capability that we can use for your visit?" a. If yes - list this number in appt notes as cell (if different from BEST phone #) and list the appointment type as a VIDEO visit in appointment notes b. If no - list the appointment type as a PHONE visit in appointment notes  3. Confirm consent - "In the setting of the current Covid19 crisis, you are scheduled for a (phone or video) visit with your provider on (date) at (time).  Just as we do with many in-office visits, in order for you to participate in this visit, we must obtain consent.  If you'd like, I can send this to your mychart (if signed up) or email for you to review.  Otherwise, I can obtain your verbal consent now.  All virtual visits are billed to your insurance company just like a normal visit would be.  By agreeing to a virtual visit, we'd like you to understand that the technology does not allow for your provider to perform an examination, and thus may limit your provider's ability to fully assess your condition. If your provider identifies any concerns that need to be evaluated in person, we will make arrangements to do so.  Finally, though the technology is pretty good, we cannot assure that it will always work on either your or our end, and in the setting of a video visit, we may have to convert it to a phone-only visit.  In either situation, we cannot ensure that we have a secure connection.  Are you willing to proceed?" STAFF: Did the patient verbally acknowledge consent to telehealth visit? Document  YES/NO here: Yes  4. Advise patient to be prepared - "Two hours prior to your appointment, go ahead and check your blood pressure, pulse, oxygen saturation, and your weight (if you have the equipment to check those) and write them all down. When your visit starts, your provider will ask you for this information. If you have an Apple Watch or Kardia device, please plan to have heart rate information ready on the day of your appointment. Please have a pen and paper handy nearby the day of the visit as well."  5. Give patient instructions for MyChart download to smartphone OR Doximity/Doxy.me as below if video visit (depending on what platform provider is using)  6. Inform patient they will receive a phone call 15 minutes prior to their appointment time (may be from unknown caller ID) so they should be prepared to answer    TELEPHONE CALL NOTE  [name] [name] has been deemed a candidate for a follow-up tele-health visit to limit community exposure during the Covid-19 pandemic. I spoke with the patient via phone to ensure availability of phone/video source, confirm preferred email & phone number, and discuss instructions and expectations.  I reminded [name] [name] to be prepared with any vital sign and/or heart rhythm information that could potentially be obtained via home monitoring, at the time of his visit. I reminded [name] [name] to expect a phone call prior to his visit.  Caryl Pina Gerringer 05/14/2018 3:50 PM   INSTRUCTIONS FOR DOWNLOADING THE MYCHART APP TO SMARTPHONE  - The patient must first make sure to have activated MyChart and know their login information - If Apple, go to [employer] and type in MyChart in the search bar and download the app. If Android, ask patient to go to [employer] and type in [place] in the search bar and download the app. The app is free but as with any other app downloads, their phone may require them to verify saved payment information or  Apple/Android password.  - The patient will need to then log into the app with their MyChart username and password, and select [facility] as their healthcare provider to link the account. When it is time for your visit, go to the MyChart app, find appointments, and click Begin Video Visit. Be sure to Select Allow for your device to access the Microphone and Camera for your visit. You will then be connected, and your provider will be with you shortly.  **If they have any issues connecting, or need assistance please contact MyChart service desk [phone])**  **If using a computer, in order to ensure the best quality for their visit they will need to use either of the following Internet Browsers: [employer], or Google Chrome**  IF USING DOXIMITY or DOXY.ME - The patient will receive a link just prior to their visit by text.     FULL LENGTH CONSENT FOR TELE-HEALTH VISIT   I hereby voluntarily request, consent and authorize [facility] and its employed or contracted physicians, physician assistants, nurse practitioners or other licensed health care professionals (the Practitioner), to provide me with telemedicine health care services (the Services") as deemed necessary by the treating Practitioner. I acknowledge and consent to receive the Services by the Practitioner via telemedicine. I understand that the telemedicine visit will involve communicating with the Practitioner through live audiovisual communication technology and the disclosure of certain medical information by electronic transmission. I acknowledge that I have been given the opportunity to request an in-person assessment or other available alternative prior to the telemedicine visit and am voluntarily participating in the telemedicine visit.  I understand that I have the right to withhold or withdraw my consent to the use of telemedicine in the course of my care at any time, without affecting my right to future care  or treatment, and that the Practitioner or I may terminate the telemedicine visit at any time. I understand that I have the right to inspect all information obtained and/or recorded in the course of the telemedicine visit and may receive copies of available information for a reasonable fee.  I understand that some of the potential risks of receiving the Services via telemedicine include:   Delay or interruption in medical evaluation due to technological equipment failure or disruption;  Information transmitted may not be sufficient (e.g. poor resolution of images) to allow for appropriate medical decision making by the Practitioner; and/or   In rare instances, security protocols could fail, causing a breach of personal health information.  Furthermore, I acknowledge that it is my responsibility to provide information about my medical history, conditions and care that is complete and accurate to the best of my ability. I acknowledge that Practitioner's advice, recommendations, and/or decision may be based on factors not within their control, such as incomplete or inaccurate data provided by me or distortions of diagnostic images or specimens that may result from electronic transmissions. I understand that the practice  of medicine is not an Chief Strategy Officer and that Practitioner makes no warranties or guarantees regarding treatment outcomes. I acknowledge that I will receive a copy of this consent concurrently upon execution via email to the email address I last provided but may also request a printed copy by calling the office of [place] [facility].    I understand that my insurance will be billed for this visit.   I have read or had this consent read to me.  I understand the contents of this consent, which adequately explains the benefits and risks of the Services being provided via telemedicine.   I have been provided ample opportunity to ask questions regarding this consent and the Services and have had  my questions answered to my satisfaction.  I give my informed consent for the services to be provided through the use of telemedicine in my medical care  By participating in this telemedicine visit I agree to the above.

## 2018-05-14 NOTE — Telephone Encounter (Signed)
Pt OD for 12 month f/u. Please contact pt for future f/u appointment.

## 2018-05-15 NOTE — Telephone Encounter (Signed)
Patient is scheduled for 05/19/2018 [facility] end in refills.

## 2018-05-17 DIAGNOSIS — I25118 Atherosclerotic heart disease of native coronary artery with other forms of angina pectoris: Secondary | ICD-10-CM | POA: Insufficient documentation

## 2018-05-17 NOTE — Progress Notes (Signed)
Virtual Visit via Video Note   This visit type was conducted due to national recommendations for restrictions regarding the COVID-19 Pandemic (e.g. social distancing) in an effort to limit this patient's exposure and mitigate transmission in our community.  Due to his co-morbid illnesses, this patient is at least at moderate risk for complications without adequate follow up.  This format is felt to be most appropriate for this patient at this time.  All issues noted in this document were discussed and addressed.  A limited physical exam was performed with this format.  Please refer to the patient's chart for his consent to telehealth for [facility].   I connected with  [name] [name] on 05/19/18 by a video enabled telemedicine application and verified that I am speaking with the correct person using two identifiers. I discussed the limitations of evaluation and management by telemedicine. The patient expressed understanding and agreed to proceed.   Evaluation Performed:  Follow-up visit  Date:  05/19/2018   ID:  [name] [name], DOB [birth year], MRN [id number]  Patient Location:  [address] [place] [address]   Provider location:   [facility], [place] office  PCP:  [name], [name]  Cardiologist:  Patsy Baltimore   Chief Complaint:  Sinus infection    History of Present Illness:    [name] [name] is a 78 y.o. male who presents via audio/video conferencing for a telehealth visit today.   The patient does not symptoms concerning for COVID-19 infection (fever, chills, cough, or new SHORTNESS OF BREATH).   Patient has a past medical history of CAD  status post MI DM II HTN Hyperlipidemia Melanoma Abdominal aorta atherosclerosis stenting was 1991, and  2005 , symptoms  nausea and jaw pain.  Who presents for routine follow-up of his coronary artery disease  4 to 5 wks ago, URI  fever and chills, body aches and feeling rundown for the  past 2 days.   Resolved its own after 2 weeks Back to normal,  Sugars running high Poor diet Still with sinus infection  Some cramping in the feet and legs at night Once every 2 to 3 weeks  Denies any chest pain or shortness of breath concerning for angina No regular exercise program  reports having chronic kidney stones  Lab work reviewed extensively with him Hemoglobin A1c 7.4, up from 6.72 years ago Total cholesterol 136 LDL 70  Scheduled to have more lab work in the next several weeks   Prior CV studies:   The following studies were reviewed today:  Pharmacologic nuclear stress test 03/26/2013,Lexiscan Old inferior MI. No ischemia. Well-preserved LVEF 50%. Slight hypokinesia of the inferior wall.  Echo 03/26/2013: LV borderline dilated. EF 50-55%. Mild global hypokinesis of LV. LA is moderately dilated. RV mildly dilated. Mild to moderate aortic sclerosis. Mild AI. Mild to moderate mitral valve thickening. Mild to moderate MR. RVSP 31 mmHg. Mild TR. Aortic root is normal in size. No pericardial effusion.    Past Medical History:  Diagnosis Date  . Aortic insufficiency    a. noted on TTE 2015  . Arthritis   . CAD (coronary artery disease)    a. remote PCI in 1991 and 2005; b. MV 3/15: old inferior MI, no ischemia, LVEF 50%, slight inferior wall hypokniesis  . Chicken pox   . Colon polyps    4 pre-cancerous   . Diverticulitis   . DM type 2 (diabetes mellitus, type 2) ([place])   . Family  history of adverse reaction to anesthesia   . GERD (gastroesophageal reflux disease)   . Heart murmur   . History of kidney stones   . Hyperlipidemia   . Hypertension   . Kidney stones   . Melanoma ([place]) 1980   Resected from his back  . Mitral regurgitation    a. noted on TTE 2015  . Myocardial infarction ([place])   . Systolic dysfunction    a. TTE 2015: EF  50-55%, mild global hypokinesis, mild to moderate aortic sclerosis, mild aortic insufficiency, mild to moderate mitral  regurgitation, mild tricuspid regurgitation, moderately dilated left atrium, mildly dilated right ventricle, PASP 31 mmHg   Past Surgical History:  Procedure Laterality Date  . CHOLECYSTECTOMY  2012  . COLONOSCOPY    . CORONARY ANGIOPLASTY WITH STENT PLACEMENT  1991 & 2005  . CYSTOSCOPY/URETEROSCOPY/HOLMIUM LASER/STENT PLACEMENT Right 01/02/2018   Procedure: CYSTOSCOPY/URETEROSCOPY/HOLMIUM LASER/STENT PLACEMENT;  Surgeon: [name], MD;  Location: [facility] ORS;  Service: Urology;  Laterality: Right;  . ESOPHAGOGASTRODUODENOSCOPY (EGD) WITH PROPOFOL N/A 11/19/2016   Procedure: ESOPHAGOGASTRODUODENOSCOPY (EGD) WITH PROPOFOL;  Surgeon: [name], MD;  Location: [facility] ENDOSCOPY;  Service: Endoscopy;  Laterality: N/A;  . LITHOTRIPSY  2015  . MELANOMA EXCISION  1980   malignant  . NERVE SURGERY  2015   ulna nerve  . TONSILLECTOMY  1945  . WISDOM TOOTH EXTRACTION       Current Meds  Medication Sig  . acetaminophen (TYLENOL) 650 MG CR tablet Take 650 mg by mouth every 8 (eight) hours as needed for pain.  [name] amoxicillin-clavulanate (AUGMENTIN) 875-125 MG tablet Take 1 tablet by mouth 2 (two) times daily.  [name] aspirin EC 81 MG tablet Take 81 mg by mouth daily.   [name] atorvastatin (LIPITOR) 80 MG tablet TAKE 1 TABLET(80 MG) BY MOUTH DAILY  . carvedilol (COREG) 3.125 MG tablet TAKE 1 TABLET(3.125 MG) BY MOUTH TWICE DAILY  . CONTOUR TEST test strip USE AS DIRECTED  . fenofibrate 160 MG tablet TAKE 1 TABLET BY MOUTH EVERY DAY  . fluticasone (FLONASE) 50 MCG/ACT nasal spray Place 2 sprays into both nostrils daily. (Patient taking differently: Place 2 sprays into both nostrils daily as needed for allergies. )  . glipiZIDE (GLUCOTROL XL) 5 MG 24 hr tablet TAKE 1 TABLET BY MOUTH DAILY WITH BREAKFAST  . glucose blood test strip Use as instructed to check blood sugar up to three times daily. E11.42  . hydrochlorothiazide (HYDRODIURIL) 25 MG tablet TAKE 1 TABLET(25 MG) BY MOUTH DAILY  . lisinopril  (PRINIVIL,ZESTRIL) 10 MG tablet TAKE 1 TABLET BY MOUTH DAILY  . Multiple Vitamin (MULTIVITAMIN) capsule Take 1 capsule by mouth daily with lunch.   . naproxen sodium (ALEVE) 220 MG tablet Take 220 mg by mouth 2 (two) times daily as needed (for pain or headache).  . Omega-3 Fatty Acids (FISH OIL PO) Take 1 capsule by mouth daily.   . pantoprazole (PROTONIX) 40 MG tablet Take 1 tablet (40 mg total) by mouth 2 (two) times daily. **PLEASE SCHEDULE FOLLOW UP APPT**  . Polyvinyl Alcohol-Povidone (MURINE TEARS FOR DRY EYES OP) Place 1-2 drops into both eyes daily as needed (for dry eyes).  . pseudoephedrine-guaifenesin (MUCINEX D) 60-600 MG 12 hr tablet Take 1 tablet by mouth every 12 (twelve) hours.  . psyllium (METAMUCIL) 58.6 % powder Take 1 packet by mouth at bedtime.   . tamsulosin (FLOMAX) 0.4 MG CAPS capsule TAKE 1 CAPSULE(0.4 MG) BY MOUTH DAILY (Patient taking differently: Take 0.4 mg by mouth daily. )  .  traMADol (ULTRAM) 50 MG tablet TAKE 1 TABLET(50 MG) BY MOUTH EVERY 12 HOURS AS NEEDED     Allergies:   Azithromycin   Social History   Tobacco Use  . Smoking status: Current Some Day Smoker    Types: Pipe  . Smokeless tobacco: Former Systems developer    Quit date: 10/17/2015  Substance Use Topics  . Alcohol use: Yes    Alcohol/week: 0.0 - 1.0 standard drinks    Comment: none last 24hrs  . Drug use: No     Current Outpatient Medications on File Prior to Visit  Medication Sig Dispense Refill  . acetaminophen (TYLENOL) 650 MG CR tablet Take 650 mg by mouth every 8 (eight) hours as needed for pain.    [name] amoxicillin-clavulanate (AUGMENTIN) 875-125 MG tablet Take 1 tablet by mouth 2 (two) times daily. 20 tablet 0  . aspirin EC 81 MG tablet Take 81 mg by mouth daily.     [name] atorvastatin (LIPITOR) 80 MG tablet TAKE 1 TABLET(80 MG) BY MOUTH DAILY 90 tablet 1  . carvedilol (COREG) 3.125 MG tablet TAKE 1 TABLET(3.125 MG) BY MOUTH TWICE DAILY 180 tablet 0  . CONTOUR TEST test strip USE AS DIRECTED 100  each 0  . fenofibrate 160 MG tablet TAKE 1 TABLET BY MOUTH EVERY DAY 90 tablet 0  . fluticasone (FLONASE) 50 MCG/ACT nasal spray Place 2 sprays into both nostrils daily. (Patient taking differently: Place 2 sprays into both nostrils daily as needed for allergies. ) 16 g 6  . glipiZIDE (GLUCOTROL XL) 5 MG 24 hr tablet TAKE 1 TABLET BY MOUTH DAILY WITH BREAKFAST 90 tablet 1  . glucose blood test strip Use as instructed to check blood sugar up to three times daily. E11.42 100 each 12  . hydrochlorothiazide (HYDRODIURIL) 25 MG tablet TAKE 1 TABLET(25 MG) BY MOUTH DAILY 90 tablet 0  . lisinopril (PRINIVIL,ZESTRIL) 10 MG tablet TAKE 1 TABLET BY MOUTH DAILY 90 tablet 1  . Multiple Vitamin (MULTIVITAMIN) capsule Take 1 capsule by mouth daily with lunch.     . naproxen sodium (ALEVE) 220 MG tablet Take 220 mg by mouth 2 (two) times daily as needed (for pain or headache).    . Omega-3 Fatty Acids (FISH OIL PO) Take 1 capsule by mouth daily.     . pantoprazole (PROTONIX) 40 MG tablet Take 1 tablet (40 mg total) by mouth 2 (two) times daily. **PLEASE SCHEDULE FOLLOW UP APPT** 60 tablet 6  . Polyvinyl Alcohol-Povidone (MURINE TEARS FOR DRY EYES OP) Place 1-2 drops into both eyes daily as needed (for dry eyes).    . pseudoephedrine-guaifenesin (MUCINEX D) 60-600 MG 12 hr tablet Take 1 tablet by mouth every 12 (twelve) hours.    . psyllium (METAMUCIL) 58.6 % powder Take 1 packet by mouth at bedtime.     . tamsulosin (FLOMAX) 0.4 MG CAPS capsule TAKE 1 CAPSULE(0.4 MG) BY MOUTH DAILY (Patient taking differently: Take 0.4 mg by mouth daily. ) 90 capsule 11  . traMADol (ULTRAM) 50 MG tablet TAKE 1 TABLET(50 MG) BY MOUTH EVERY 12 HOURS AS NEEDED 60 tablet 1   No current facility-administered medications on file prior to visit.      Family Hx: The patient's family history includes Colon cancer in his father; Diabetes in his father and mother; Heart disease in his mother; Hyperlipidemia in his mother; Hypertension in  his mother; Kidney cancer in his father; Lung cancer in his father. There is no history of Bladder Cancer or Prostate cancer.  ROS:   Please see the history of present illness.    Review of Systems  Constitutional: Negative.   HENT: Positive for congestion and sinus pain.   Respiratory: Negative.   Cardiovascular: Negative.   Gastrointestinal: Negative.   Musculoskeletal: Positive for myalgias.  Neurological: Negative.   Psychiatric/Behavioral: Negative.   All other systems reviewed and are negative.     Labs/Other Tests and Data Reviewed:    Recent Labs: 12/22/2017: Hemoglobin 14.4; Platelets 184 03/02/2018: ALT 30; BUN 19; Creatinine, Ser 0.86; Potassium 3.7; Sodium 139   Recent Lipid Panel Lab Results  Component Value Date/Time   CHOL 136 03/02/2018 08:22 AM   TRIG 143.0 03/02/2018 08:22 AM   HDL 36.50 (L) 03/02/2018 08:22 AM   CHOLHDL 4 03/02/2018 08:22 AM   LDLCALC 70 03/02/2018 08:22 AM   LDLDIRECT 49.0 05/01/2016 09:40 AM    Wt Readings from Last 3 Encounters:  03/06/18 207 lb (93.9 kg)  02/23/18 206 lb 6.4 oz (93.6 kg)  02/12/18 205 lb (93 kg)     Exam:    Vital Signs: Vital signs may also be detailed in the HPI There were no vitals taken for this visit.  Wt Readings from Last 3 Encounters:  03/06/18 207 lb (93.9 kg)  02/23/18 206 lb 6.4 oz (93.6 kg)  02/12/18 205 lb (93 kg)   Temp Readings from Last 3 Encounters:  04/16/18 98.6 F (37 C)  03/06/18 (!) 97.5 F (36.4 C)  02/23/18 97.9 F (36.6 C)   BP Readings from Last 3 Encounters:  04/16/18 136/82  03/06/18 116/74  02/23/18 122/70   Pulse Readings from Last 3 Encounters:  04/16/18 64  03/06/18 (!) 57  02/23/18 73    130/70, pulse 70 resp 16  Well nourished, well developed male in no acute distress. Constitutional:  oriented to person, place, and time. No distress.  [name]: Normocephalic and atraumatic.  Eyes:  no discharge. No scleral icterus.  Neck: Normal range of motion. Neck supple.   Pulmonary/Chest: No audible wheezing, no distress, appears comfortable Musculoskeletal: Normal range of motion.  no  tenderness or deformity.  Neurological:   Coordination normal. Full exam not performed Skin:  No rash Psychiatric:  normal mood and affect. behavior is normal. Thought content normal.    ASSESSMENT & PLAN:    Atherosclerosis of native coronary artery with stable angina pectoris, unspecified whether native or transplanted heart [facility]) Currently with no symptoms of angina. No further workup at this time. Continue current medication regimen.  Essential hypertension Blood pressure is well controlled on today's visit. No changes made to the medications. Add OTC potassium, given his cramping  Bradycardia Stable, no sx  Atherosclerosis of abdominal aorta (H[facility]) Lipids at goal but having cramps Will change to crestor 10 daily and zetia 10 mg daily  Type 2 diabetes mellitus with other circulatory complication, without long-term current use of insulin ([place]) labs pending  Carotid artery disease, unspecified laterality ([place]) U/s in 2017 Minor carotid atherosclerosis. No hemodynamically significant ICA stenosis. Degree of narrowing less than 50% bilaterally.  COVID-19 Education: The signs and symptoms of COVID-19 were discussed with the patient and how to seek care for testing (follow up with PCP or arrange E-visit).  The importance of social distancing was discussed today.  Patient Risk:   After full review of this patients clinical status, I feel that they are at least moderate risk at this time.  Time:   Today, I have spent 25 minutes with the patient with telehealth  technology discussing the cardiac and medical problems/diagnoses detailed above   10 min spent reviewing the chart prior to patient visit today   Medication Adjustments/Labs and Tests Ordered: Current medicines are reviewed at length with the patient today.  Concerns regarding medicines are outlined above.    Tests Ordered: No tests ordered   Medication Changes: As above  Disposition: Follow-up in 12 months   Signed, Ida Rogue, MD  05/19/2018 9:58 AM    [place] Office [address] [place] [address], [place], [place] [address]

## 2018-05-18 ENCOUNTER — Telehealth: Payer: Self-pay | Admitting: Family

## 2018-05-18 DIAGNOSIS — E118 Type 2 diabetes mellitus with unspecified complications: Secondary | ICD-10-CM

## 2018-05-18 DIAGNOSIS — N400 Enlarged prostate without lower urinary tract symptoms: Secondary | ICD-10-CM

## 2018-05-18 DIAGNOSIS — I10 Essential (primary) hypertension: Secondary | ICD-10-CM

## 2018-05-18 NOTE — Telephone Encounter (Signed)
I called and spoke with patient's wife. She will have patient return my call.

## 2018-05-18 NOTE — Telephone Encounter (Signed)
Pt has a doxy on 05/25/2018, pt would like to know if [name] wants to do labs before virtual appt.

## 2018-05-18 NOTE — Telephone Encounter (Signed)
Call pt As for labs prior to doxy, we need  3 months between a1c draws for insurance tocover.  We can repeat this lab on 5/17.  He can move his appointment or we can do this after our appointment.  Labs ordered for CMP, PSA, and A1c.   Please sch on 5/17 or after.   If he desires more labs, let me know.

## 2018-05-19 ENCOUNTER — Other Ambulatory Visit: Payer: Self-pay

## 2018-05-19 ENCOUNTER — Telehealth (INDEPENDENT_AMBULATORY_CARE_PROVIDER_SITE_OTHER): Payer: Medicare Other | Admitting: Cardiovascular Disease

## 2018-05-19 DIAGNOSIS — I700 Atherosclerosis of aorta: Secondary | ICD-10-CM

## 2018-05-19 DIAGNOSIS — I739 Peripheral vascular disease, unspecified: Secondary | ICD-10-CM

## 2018-05-19 DIAGNOSIS — I25118 Atherosclerotic heart disease of native coronary artery with other forms of angina pectoris: Secondary | ICD-10-CM | POA: Diagnosis not present

## 2018-05-19 DIAGNOSIS — E1159 Type 2 diabetes mellitus with other circulatory complications: Secondary | ICD-10-CM

## 2018-05-19 DIAGNOSIS — I10 Essential (primary) hypertension: Secondary | ICD-10-CM

## 2018-05-19 DIAGNOSIS — I519 Heart disease, unspecified: Secondary | ICD-10-CM | POA: Diagnosis not present

## 2018-05-19 DIAGNOSIS — R001 Bradycardia, unspecified: Secondary | ICD-10-CM | POA: Diagnosis not present

## 2018-05-19 DIAGNOSIS — I779 Disorder of arteries and arterioles, unspecified: Secondary | ICD-10-CM

## 2018-05-19 MED ORDER — ROSUVASTATIN CALCIUM 10 MG PO TABS: 10.0000 mg | ORAL_TABLET | Freq: Every day | ORAL | 3 refills | Status: DC

## 2018-05-19 MED ORDER — EZETIMIBE 10 MG PO TABS: 10.0000 mg | ORAL_TABLET | Freq: Every day | ORAL | 3 refills | Status: DC

## 2018-05-19 NOTE — Patient Instructions (Addendum)
Medication Instructions:  Your physician has recommended you make the following change in your medication:  1. STOP Atorvastatin  2. START Rosuvastatin 10 mg once daily 3. START Zetia 10 mg once daily  4. TRY over the counter potassium daily  If you need a refill on your cardiac medications before your next appointment, please call your pharmacy.    Lab work: No new labs needed   If you have labs (blood work) drawn today and your tests are completely normal, you will receive your results only by: [name] MyChart Message (if you have MyChart) OR . A paper copy in the mail If you have any lab test that is abnormal or we need to change your treatment, we will call you to review the results.   Testing/Procedures: No new testing needed   Follow-Up: At [facility], you and your health needs are our priority.  As part of our continuing mission to provide you with exceptional heart care, we have created designated Provider Care Teams.  These Care Teams include your primary Cardiologist (physician) and Advanced Practice Providers (APPs -  Physician Assistants and Nurse Practitioners) who all work together to provide you with the care you need, when you need it.  . You will need a follow up appointment in 12 months .   Please call our office 2 months in advance to schedule this appointment.    . Providers on your designated Care Team:   . Murray Hodgkins, NP . Christell Faith, PA-C . Marrianne Mood, PA-C  Any Other Special Instructions Will Be Listed Below (If Applicable).  For educational health videos Log in to : [email or web address] Or : [email or web address], password : [email or web address]

## 2018-05-20 ENCOUNTER — Other Ambulatory Visit: Payer: Self-pay

## 2018-05-25 ENCOUNTER — Ambulatory Visit: Payer: Medicare Other | Admitting: Family

## 2018-05-26 ENCOUNTER — Ambulatory Visit (INDEPENDENT_AMBULATORY_CARE_PROVIDER_SITE_OTHER): Payer: Medicare Other | Admitting: Internal Medicine

## 2018-05-26 ENCOUNTER — Other Ambulatory Visit: Payer: Self-pay

## 2018-05-26 DIAGNOSIS — E11621 Type 2 diabetes mellitus with foot ulcer: Secondary | ICD-10-CM | POA: Diagnosis not present

## 2018-05-26 DIAGNOSIS — L97529 Non-pressure chronic ulcer of other part of left foot with unspecified severity: Secondary | ICD-10-CM | POA: Diagnosis not present

## 2018-05-26 DIAGNOSIS — B370 Candidal stomatitis: Secondary | ICD-10-CM | POA: Diagnosis not present

## 2018-05-26 DIAGNOSIS — B379 Candidiasis, unspecified: Secondary | ICD-10-CM | POA: Diagnosis not present

## 2018-05-26 DIAGNOSIS — B353 Tinea pedis: Secondary | ICD-10-CM

## 2018-05-26 DIAGNOSIS — S91105A Unspecified open wound of left lesser toe(s) without damage to nail, initial encounter: Secondary | ICD-10-CM

## 2018-05-26 MED ORDER — DOXYCYCLINE HYCLATE 100 MG PO TABS: 100.0000 mg | ORAL_TABLET | Freq: Two times a day (BID) | ORAL | 0 refills | Status: DC

## 2018-05-26 MED ORDER — FLUCONAZOLE 150 MG PO TABS: 150.0000 mg | ORAL_TABLET | Freq: Once | ORAL | 0 refills | Status: AC

## 2018-05-26 MED ORDER — MUPIROCIN 2 % EX OINT: 1.0000 "application " | TOPICAL_OINTMENT | Freq: Two times a day (BID) | CUTANEOUS | 0 refills | Status: DC

## 2018-05-26 MED ORDER — MICONAZOLE NITRATE 2 % EX CREA: 1.0000 "application " | TOPICAL_CREAM | Freq: Two times a day (BID) | CUTANEOUS | 0 refills | Status: DC

## 2018-05-26 MED ORDER — NYSTATIN 100000 UNIT/ML MT SUSP: 5.0000 mL | Freq: Four times a day (QID) | OROMUCOSAL | 0 refills | Status: DC

## 2018-05-26 NOTE — Progress Notes (Signed)
Pre visit review using our clinic review tool, if applicable. No additional management support is needed unless otherwise documented below in the visit note. 

## 2018-05-26 NOTE — Progress Notes (Addendum)
Virtual Visit via Video Note  I connected with [name] [name]   on 05/26/18 at  1:06 PM EDT by a video enabled telemedicine application and verified that I am speaking with the correct person using two identifiers.  Location patient: home Location provider:work  Persons participating in the virtual visit: patient, provider, pts wife   I discussed the limitations of evaluation and management by telemedicine and the availability of in person appointments. The patient expressed understanding and agreed to proceed.   HPI: Left toe wound noticed yesterday but prior to yesterday top of his foot was itching x 4-5 days. Area is between left 2nd and 3rd toe he tried peroxide and neosporin he is concerned b/c he has DM 2 and his mother was double amputee    ROS: See pertinent positives and negatives per HPI.  Past Medical History:  Diagnosis Date  . Aortic insufficiency    a. noted on TTE 2015  . Arthritis   . CAD (coronary artery disease)    a. remote PCI in 1991 and 2005; b. MV 3/15: old inferior MI, no ischemia, LVEF 50%, slight inferior wall hypokniesis  . Chicken pox   . Colon polyps    4 pre-cancerous   . Diverticulitis   . DM type 2 (diabetes mellitus, type 2) ([place])   . Family history of adverse reaction to anesthesia   . GERD (gastroesophageal reflux disease)   . Heart murmur   . History of kidney stones   . Hyperlipidemia   . Hypertension   . Kidney stones   . Melanoma ([place]) 1980   Resected from his back  . Mitral regurgitation    a. noted on TTE 2015  . Myocardial infarction ([place])   . Systolic dysfunction    a. TTE 2015: EF  50-55%, mild global hypokinesis, mild to moderate aortic sclerosis, mild aortic insufficiency, mild to moderate mitral regurgitation, mild tricuspid regurgitation, moderately dilated left atrium, mildly dilated right ventricle, PASP 31 mmHg    Past Surgical History:  Procedure Laterality Date  . CHOLECYSTECTOMY  2012  . COLONOSCOPY    . CORONARY  ANGIOPLASTY WITH STENT PLACEMENT  1991 & 2005  . CYSTOSCOPY/URETEROSCOPY/HOLMIUM LASER/STENT PLACEMENT Right 01/02/2018   Procedure: CYSTOSCOPY/URETEROSCOPY/HOLMIUM LASER/STENT PLACEMENT;  Surgeon: [name], MD;  Location: [facility] ORS;  Service: Urology;  Laterality: Right;  . ESOPHAGOGASTRODUODENOSCOPY (EGD) WITH PROPOFOL N/A 11/19/2016   Procedure: ESOPHAGOGASTRODUODENOSCOPY (EGD) WITH PROPOFOL;  Surgeon: [name], MD;  Location: [facility] ENDOSCOPY;  Service: Endoscopy;  Laterality: N/A;  . LITHOTRIPSY  2015  . MELANOMA EXCISION  1980   malignant  . NERVE SURGERY  2015   ulna nerve  . TONSILLECTOMY  1945  . WISDOM TOOTH EXTRACTION      Family History  Problem Relation Age of Onset  . Hyperlipidemia Mother   . Hypertension Mother   . Heart disease Mother   . Diabetes Mother   . Colon cancer Father   . Lung cancer Father   . Kidney cancer Father        malignant capsulated kidney tumor  . Diabetes Father   . Bladder Cancer Neg Hx   . Prostate cancer Neg Hx     SOCIAL HX: lives with wife    Current Outpatient Medications:  .  acetaminophen (TYLENOL) 650 MG CR tablet, Take 650 mg by mouth every 8 (eight) hours as needed for pain., Disp: , Rfl:  .  aspirin EC 81 MG tablet, Take 81 mg by mouth daily. , Disp: ,  Rfl:  .  carvedilol (COREG) 3.125 MG tablet, TAKE 1 TABLET(3.125 MG) BY MOUTH TWICE DAILY, Disp: 180 tablet, Rfl: 0 .  CONTOUR TEST test strip, USE AS DIRECTED, Disp: 100 each, Rfl: 0 .  ezetimibe (ZETIA) 10 MG tablet, Take 1 tablet (10 mg total) by mouth daily., Disp: 90 tablet, Rfl: 3 .  fenofibrate 160 MG tablet, TAKE 1 TABLET BY MOUTH EVERY DAY, Disp: 90 tablet, Rfl: 0 .  fluticasone (FLONASE) 50 MCG/ACT nasal spray, Place 2 sprays into both nostrils daily. (Patient taking differently: Place 2 sprays into both nostrils daily as needed for allergies. ), Disp: 16 g, Rfl: 6 .  glipiZIDE (GLUCOTROL XL) 5 MG 24 hr tablet, TAKE 1 TABLET BY MOUTH DAILY WITH BREAKFAST, Disp:  90 tablet, Rfl: 1 .  glucose blood test strip, Use as instructed to check blood sugar up to three times daily. E11.42, Disp: 100 each, Rfl: 12 .  hydrochlorothiazide (HYDRODIURIL) 25 MG tablet, TAKE 1 TABLET(25 MG) BY MOUTH DAILY, Disp: 90 tablet, Rfl: 0 .  lisinopril (PRINIVIL,ZESTRIL) 10 MG tablet, TAKE 1 TABLET BY MOUTH DAILY, Disp: 90 tablet, Rfl: 1 .  Multiple Vitamin (MULTIVITAMIN) capsule, Take 1 capsule by mouth daily with lunch. , Disp: , Rfl:  .  naproxen sodium (ALEVE) 220 MG tablet, Take 220 mg by mouth 2 (two) times daily as needed (for pain or headache)., Disp: , Rfl:  .  Omega-3 Fatty Acids (FISH OIL PO), Take 1 capsule by mouth daily. , Disp: , Rfl:  .  pantoprazole (PROTONIX) 40 MG tablet, Take 1 tablet (40 mg total) by mouth 2 (two) times daily. **PLEASE SCHEDULE FOLLOW UP APPT**, Disp: 60 tablet, Rfl: 6 .  Polyvinyl Alcohol-Povidone (MURINE TEARS FOR DRY EYES OP), Place 1-2 drops into both eyes daily as needed (for dry eyes)., Disp: , Rfl:  .  pseudoephedrine-guaifenesin (MUCINEX D) 60-600 MG 12 hr tablet, Take 1 tablet by mouth every 12 (twelve) hours., Disp: , Rfl:  .  psyllium (METAMUCIL) 58.6 % powder, Take 1 packet by mouth at bedtime. , Disp: , Rfl:  .  rosuvastatin (CRESTOR) 10 MG tablet, Take 1 tablet (10 mg total) by mouth daily., Disp: 90 tablet, Rfl: 3 .  tamsulosin (FLOMAX) 0.4 MG CAPS capsule, TAKE 1 CAPSULE(0.4 MG) BY MOUTH DAILY (Patient taking differently: Take 0.4 mg by mouth daily. ), Disp: 90 capsule, Rfl: 11 .  traMADol (ULTRAM) 50 MG tablet, TAKE 1 TABLET(50 MG) BY MOUTH EVERY 12 HOURS AS NEEDED, Disp: 60 tablet, Rfl: 1 .  doxycycline (VIBRA-TABS) 100 MG tablet, Take 1 tablet (100 mg total) by mouth 2 (two) times daily. With food, Disp: 14 tablet, Rfl: 0 .  fluconazole (DIFLUCAN) 150 MG tablet, Take 1 tablet (150 mg total) by mouth once for 1 dose., Disp: 1 tablet, Rfl: 0 .  miconazole (MICATIN) 2 % cream, Apply 1 application topically 2 (two) times daily.,  Disp: 60 g, Rfl: 0 .  mupirocin ointment (BACTROBAN) 2 %, Apply 1 application topically 2 (two) times daily. Left 3rd toe, Disp: 30 g, Rfl: 0 .  nystatin (MYCOSTATIN) 100000 UNIT/ML suspension, Take 5 mLs (500,000 Units total) by mouth 4 (four) times daily., Disp: 473 mL, Rfl: 0  EXAM:  VITALS per patient if applicable:  GENERAL: alert, oriented, appears well and in no acute distress  HEENT: atraumatic, conjunttiva clear, no obvious abnormalities on inspection of external nose and ears  NECK: normal movements of the head and neck  LUNGS: on inspection no signs of respiratory  distress, breathing rate appears normal, no obvious gross SOB, gasping or wheezing  CV: no obvious cyanosis  MS: moves all visible extremities without noticeable abnormality  PSYCH/NEURO: pleasant and cooperative, no obvious depression or anxiety, speech and thought processing grossly intact  Skin: possibly tinea pedis interdigital with left 2nd/3rd DM foot wound/ulceration red possibly with ulceration ~1-1.5 cm and scale in the interdigital web   ASSESSMENT AND PLAN:  Discussed the following assessment and plan:  Diabetic ulcer of toe of left foot associated with type 2 diabetes mellitus, unspecified ulcer stage ([place]) - Plan: doxycycline (VIBRA-TABS) 100 MG tablet, mupirocin ointment (BACTROBAN) 2 %, Ambulatory referral to Podiatry Dr. Amalia Hailey, [facility] or [place]  -pt seen by podiatry 05/28/2018 and they are concerned with COVID toes and rec COVID 19 testing we set pt up with [facility] for drive up testing also 05/28/2018 pt asking about antibody testing can disc with PCP after COVID 19 test done if PCP deems appropriate   Tinea pedis of left foot - Plan: miconazole (MICATIN) 2 % cream  Yeast infection with Abx use I.e thrush- Plan: fluconazole (DIFLUCAN) 150 MG tablet, nystatin (MYCOSTATIN) 100000 UNIT/ML suspension -will pick up Rx if needed      I discussed the assessment and treatment plan with the  patient. The patient was provided an opportunity to ask questions and all were answered. The patient agreed with the plan and demonstrated an understanding of the instructions.   The patient was advised to call back or seek an in-person evaluation if the symptoms worsen or if the condition fails to improve as anticipated.  Time spent 15 minutes  Delorise Jackson, MD

## 2018-05-28 ENCOUNTER — Telehealth: Payer: Self-pay

## 2018-05-28 ENCOUNTER — Other Ambulatory Visit: Payer: Self-pay

## 2018-05-28 ENCOUNTER — Ambulatory Visit (INDEPENDENT_AMBULATORY_CARE_PROVIDER_SITE_OTHER): Payer: Medicare Other | Admitting: Podiatry

## 2018-05-28 ENCOUNTER — Encounter: Payer: Self-pay | Admitting: Podiatry

## 2018-05-28 ENCOUNTER — Telehealth: Payer: Self-pay | Admitting: Family

## 2018-05-28 ENCOUNTER — Other Ambulatory Visit: Payer: Medicare Other

## 2018-05-28 VITALS — Temp 96.9°F

## 2018-05-28 DIAGNOSIS — I25118 Atherosclerotic heart disease of native coronary artery with other forms of angina pectoris: Secondary | ICD-10-CM | POA: Diagnosis not present

## 2018-05-28 DIAGNOSIS — Z20822 Contact with and (suspected) exposure to covid-19: Secondary | ICD-10-CM

## 2018-05-28 DIAGNOSIS — E1142 Type 2 diabetes mellitus with diabetic polyneuropathy: Secondary | ICD-10-CM | POA: Diagnosis not present

## 2018-05-28 DIAGNOSIS — R238 Other skin changes: Secondary | ICD-10-CM

## 2018-05-28 DIAGNOSIS — R6889 Other general symptoms and signs: Secondary | ICD-10-CM | POA: Diagnosis not present

## 2018-05-28 DIAGNOSIS — N200 Calculus of kidney: Secondary | ICD-10-CM | POA: Insufficient documentation

## 2018-05-28 LAB — NOVEL CORONAVIRUS, NAA: SARS-CoV-2, NAA: NOT DETECTED

## 2018-05-28 NOTE — Telephone Encounter (Signed)
Pt called the [facility] pool and stated he saw the podiatrist after a referral was put in by Orland Mustard for a lesion on his toe. The patient states the Podiatrist states his lesion looks like Covid-19 and wants him tested.  Pt states he had covid in April.  I called the Drive-up appointment testing line and gave them the patients information and they stated they would call the patient and schedule a appointment for covid testing.  Jaydn Fincher,cma

## 2018-05-28 NOTE — Telephone Encounter (Signed)
I called pt and made him an appt for today  05/28/2018 at 12:00 for COVID-19 testing at the [facility] near [facility]. I gave him the street address for his GPS.

## 2018-05-28 NOTE — Progress Notes (Signed)
     This patient presents to the office per referral by Dr. Algernon Huxley.  This patient presents the office for an evaluation of a purplish discoloration on the inside of his third toe.  Patient states that he was seen by Dr. Algernon Huxley 2 days ago and treated with doxycycline and Bactroban.  He also was set up for an appointment at this office for an evaluation and treatment.  This patient has a history of diabetic neuropathy.  He also has a history of squamous cell carcinoma and melanoma.  This patient states that this lesion has been present now for 4 days but there is no pain or drainage from the site of the lesion.  He says he has been using peroxide on this lesion but there have been no changes for the last 4 days.  Patient does say that he has had multiple symptoms consistent with the COVID virus such as GI discomfort as well as throat irritation.  He talked with his medical doctor in April about his symptoms and his medical doctor did not recommend any further evaluation of his condition including not checking for the COVID virus.  Patient denies any history of insect bites due to the fact that he always wears shoes and socks daily.  Patient does admit that he was itching the top of his foot with his right foot but the skin discoloration formed only on his toe.  He presents the office today for an evaluation and treatment of his third toe left foot.   Vascular  Dorsalis pedis and posterior tibial pulses are palpable  B/L.  Capillary return  WNL.  Temperature gradient is  WNL.  Skin turgor  WNL  Sensorium  Senn Weinstein monofilament wire  Diminished but present on 3rd toe left foot.. Normal tactile sensation.  Nail Exam  Patient has normal nails with no evidence of bacterial or fungal infection.  Orthopedic  Exam  Muscle tone and muscle strength  WNL.  No limitations of motion feet  B/L.  No crepitus or joint effusion noted.  Foot type is unremarkable and digits show no abnormalities.  Bony prominences  are unremarkable.  Skin  No open lesions.  Normal skin texture and turgor.  Patient has a non-painful purple discoloration on the medial aspect of the third toe left foot.  It has the appearance of a vesicular lesion.  There is only one lesion noted on his foot.  No evidence of any redness swelling or infection noted to the third toe of the left foot.  Vessicular skin lesion third toe left foot.  IE.  My evaluation of his toe revealed no evidence of any infection to the toe at this visit.  This does have an appearance of a possible insect bite but he does not believe that could have occurred due to his socks and shoes being worn continually.  I am concerned this could be a  vesicular lesions caused by the COVID virus.  Therefore I will ask Dr. Jacqualyn Posey to evaluate these above pictures and render a second opinion.  Patient was told that he will be called at home once this matter is discussed with Dr. Jacqualyn Posey.    Gardiner Barefoot DPM

## 2018-05-29 NOTE — Telephone Encounter (Signed)
Noted  Please follow up with pt and ensure this has been scheduled

## 2018-05-29 NOTE — Telephone Encounter (Signed)
Pt states he was already tested and to please give him the results by mychart asap because he has relatives that would like to come in this weekend, so please post results to mychart when you get it.  Braelen Sproule,cma

## 2018-06-01 ENCOUNTER — Telehealth: Payer: Self-pay

## 2018-06-01 LAB — NOVEL CORONAVIRUS, NAA: SARS-CoV-2, NAA: NOT DETECTED

## 2018-06-01 NOTE — Telephone Encounter (Signed)
They can take 3 business days or more. It is not back.  Gae Bon   Please call covid line and inquire about lab and ensure it was not lost etc?   [phone]  Let me know

## 2018-06-01 NOTE — Telephone Encounter (Signed)
Copied from [place] [id number]. Topic: General - Inquiry >> Jun 01, 2018 10:08 AM Rutherford Nail, NT wrote: Reason for CRM: Patient calling and states that her had a COVID 19 test on 05/28/2018 and the results were supposed to be sent to [facility]. Patient calling to check status of results. Please advise.  CB#: [phone]

## 2018-06-03 ENCOUNTER — Encounter: Payer: Self-pay | Admitting: Family

## 2018-06-03 ENCOUNTER — Other Ambulatory Visit: Payer: Self-pay

## 2018-06-03 ENCOUNTER — Other Ambulatory Visit (INDEPENDENT_AMBULATORY_CARE_PROVIDER_SITE_OTHER): Payer: Medicare Other

## 2018-06-03 DIAGNOSIS — N400 Enlarged prostate without lower urinary tract symptoms: Secondary | ICD-10-CM | POA: Diagnosis not present

## 2018-06-03 DIAGNOSIS — E118 Type 2 diabetes mellitus with unspecified complications: Secondary | ICD-10-CM

## 2018-06-03 DIAGNOSIS — I10 Essential (primary) hypertension: Secondary | ICD-10-CM | POA: Diagnosis not present

## 2018-06-03 LAB — COMPREHENSIVE METABOLIC PANEL
ALT: 43 U/L (ref 0–53)
AST: 35 U/L (ref 0–37)
Albumin: 4.1 g/dL (ref 3.5–5.2)
Alkaline Phosphatase: 43 U/L (ref 39–117)
BUN: 17 mg/dL (ref 6–23)
CO2: 26 mEq/L (ref 19–32)
Calcium: 9.6 mg/dL (ref 8.4–10.5)
Chloride: 102 mEq/L (ref 96–112)
Creatinine, Ser: 0.7 mg/dL (ref 0.40–1.50)
GFR: 108.96 mL/min (ref >60.00)
Glucose, Bld: 158 mg/dL — ABNORMAL HIGH (ref 70–99)
Potassium: 3.9 mEq/L (ref 3.5–5.1)
Sodium: 138 mEq/L (ref 135–145)
Total Bilirubin: 1.2 mg/dL (ref 0.2–1.2)
Total Protein: 6.7 g/dL (ref 6.0–8.3)

## 2018-06-03 LAB — PSA: PSA: 0.6 ng/mL (ref 0.10–4.00)

## 2018-06-03 LAB — HEMOGLOBIN A1C: Hgb A1c MFr Bld: 7.8 % — ABNORMAL HIGH (ref 4.6–6.5)

## 2018-06-04 NOTE — Telephone Encounter (Signed)
Pt's results were giving to him by the people that did the test.  Nina,cma

## 2018-06-08 ENCOUNTER — Encounter: Payer: Self-pay | Admitting: Family

## 2018-06-10 ENCOUNTER — Ambulatory Visit (INDEPENDENT_AMBULATORY_CARE_PROVIDER_SITE_OTHER): Payer: Medicare Other | Admitting: Family

## 2018-06-10 ENCOUNTER — Encounter: Payer: Self-pay | Admitting: Family

## 2018-06-10 DIAGNOSIS — I25118 Atherosclerotic heart disease of native coronary artery with other forms of angina pectoris: Secondary | ICD-10-CM | POA: Diagnosis not present

## 2018-06-10 DIAGNOSIS — E118 Type 2 diabetes mellitus with unspecified complications: Secondary | ICD-10-CM

## 2018-06-10 DIAGNOSIS — L989 Disorder of the skin and subcutaneous tissue, unspecified: Secondary | ICD-10-CM | POA: Diagnosis not present

## 2018-06-10 DIAGNOSIS — J0100 Acute maxillary sinusitis, unspecified: Secondary | ICD-10-CM | POA: Diagnosis not present

## 2018-06-10 DIAGNOSIS — L299 Pruritus, unspecified: Secondary | ICD-10-CM

## 2018-06-10 DIAGNOSIS — N2889 Other specified disorders of kidney and ureter: Secondary | ICD-10-CM | POA: Diagnosis not present

## 2018-06-10 MED ORDER — GLIPIZIDE ER 10 MG PO TB24: 10.0000 mg | ORAL_TABLET | Freq: Every day | ORAL | 1 refills | Status: DC

## 2018-06-10 NOTE — Patient Instructions (Addendum)
Please make a follow up with dermatology and try to move up appointment. Please ensure to inform your dermatologist is aware of toe lesion.  Trial of emollient therapy, increase water intake. Let me know if itching hands does not resolve.   Call to schedule your labs for COVID as well as for your wife.   Increase glipizide to 10mg  before breakfast. Increase exercise.   We will recheck your a1c in August.  Let me know of any concerns.

## 2018-06-10 NOTE — Progress Notes (Signed)
This visit type was conducted due to national recommendations for restrictions regarding the COVID-19 pandemic (e.g. social distancing).  This format is felt to be most appropriate for this patient at this time.  All issues noted in this document were discussed and addressed.  No physical exam was performed (except for noted visual exam findings with Video Visits). Virtual Visit via Video Note  I connected with@  on 06/12/18 at  4:00 PM EDT by a video enabled telemedicine application and verified that I am speaking with the correct person using two identifiers.  Location patient: home Location provider:work  Persons participating in the virtual visit: patient, provider  I discussed the limitations of evaluation and management by telemedicine and the availability of in person appointments. The patient expressed understanding and agreed to proceed.   HPI:  Accompanied by wife.   DM- FBG 'all over the place' , high reading of 163 for past month. Today FBG 154. Average of 140.  Doesn't seem to relate to diet.  a1c 7.8 Not getting exercise with [employer] closed.   HLD- zetia and crestor ; had been on lipitor but stopped to myalgia.   Describes itching sensation on hands x 2 weeks, unchanged. wanders if it is zetia, crestor since it became 'more pronounced.' Skin appears dry; improved with Aloe Vera. No redness, wounds on hands, rash. Using peroxide ,alcohol.  Washing hands more often, using hand sanitizer  Chronic LBP- doing well on tramadol.   Right renal mass- [name]; pending MR abdomen; continues to follow with Dr Lennox Laity.   Left lesion between 2nd and third toe on right lateral 3rd toe- 'healing' and 'clear'- treated with doxycycline, bactroban. No drainage,  Fever. Cleaning area of peroxide.   Saw podiatry 05/28/18 Marlena Clipper- diminished sensation; no evidence of infection. Concern for vesticular lesion caused by covid.   H/o DM neuropathy, SCC, melanoma   ROS: See pertinent  positives and negatives per HPI.  Past Medical History:  Diagnosis Date  . Aortic insufficiency    a. noted on TTE 2015  . Arthritis   . CAD (coronary artery disease)    a. remote PCI in 1991 and 2005; b. MV 3/15: old inferior MI, no ischemia, LVEF 50%, slight inferior wall hypokniesis  . Chicken pox   . Colon polyps    4 pre-cancerous   . Diverticulitis   . DM type 2 (diabetes mellitus, type 2) ([place])   . Family history of adverse reaction to anesthesia   . GERD (gastroesophageal reflux disease)   . Heart murmur   . History of kidney stones   . Hyperlipidemia   . Hypertension   . Kidney stones   . Melanoma ([place]) 1980   Resected from his back  . Mitral regurgitation    a. noted on TTE 2015  . Myocardial infarction ([place])   . Systolic dysfunction    a. TTE 2015: EF  50-55%, mild global hypokinesis, mild to moderate aortic sclerosis, mild aortic insufficiency, mild to moderate mitral regurgitation, mild tricuspid regurgitation, moderately dilated left atrium, mildly dilated right ventricle, PASP 31 mmHg    Past Surgical History:  Procedure Laterality Date  . CHOLECYSTECTOMY  2012  . COLONOSCOPY    . CORONARY ANGIOPLASTY WITH STENT PLACEMENT  1991 & 2005  . CYSTOSCOPY/URETEROSCOPY/HOLMIUM LASER/STENT PLACEMENT Right 01/02/2018   Procedure: CYSTOSCOPY/URETEROSCOPY/HOLMIUM LASER/STENT PLACEMENT;  Surgeon: [name], MD;  Location: [facility] ORS;  Service: Urology;  Laterality: Right;  . ESOPHAGOGASTRODUODENOSCOPY (EGD) WITH PROPOFOL N/A 11/19/2016   Procedure:  ESOPHAGOGASTRODUODENOSCOPY (EGD) WITH PROPOFOL;  Surgeon: [name], MD;  Location: [facility] ENDOSCOPY;  Service: Endoscopy;  Laterality: N/A;  . LITHOTRIPSY  2015  . MELANOMA EXCISION  1980   malignant  . NERVE SURGERY  2015   ulna nerve  . TONSILLECTOMY  1945  . WISDOM TOOTH EXTRACTION      Family History  Problem Relation Age of Onset  . Hyperlipidemia Mother   . Hypertension Mother   . Heart disease Mother   .  Diabetes Mother   . Colon cancer Father   . Lung cancer Father   . Kidney cancer Father        malignant capsulated kidney tumor  . Diabetes Father   . Bladder Cancer Neg Hx   . Prostate cancer Neg Hx     SOCIAL HX: smoker   Current Outpatient Medications:  .  acetaminophen (TYLENOL) 650 MG CR tablet, Take 650 mg by mouth every 8 (eight) hours as needed for pain., Disp: , Rfl:  .  aspirin EC 81 MG tablet, Take 81 mg by mouth daily. , Disp: , Rfl:  .  carvedilol (COREG) 3.125 MG tablet, TAKE 1 TABLET(3.125 MG) BY MOUTH TWICE DAILY, Disp: 180 tablet, Rfl: 0 .  CONTOUR TEST test strip, USE AS DIRECTED, Disp: 100 each, Rfl: 0 .  ezetimibe (ZETIA) 10 MG tablet, Take 1 tablet (10 mg total) by mouth daily., Disp: 90 tablet, Rfl: 3 .  fluticasone (FLONASE) 50 MCG/ACT nasal spray, Place 2 sprays into both nostrils daily. (Patient taking differently: Place 2 sprays into both nostrils daily as needed for allergies. ), Disp: 16 g, Rfl: 6 .  glucose blood test strip, Use as instructed to check blood sugar up to three times daily. E11.42, Disp: 100 each, Rfl: 12 .  hydrochlorothiazide (HYDRODIURIL) 25 MG tablet, TAKE 1 TABLET(25 MG) BY MOUTH DAILY, Disp: 90 tablet, Rfl: 0 .  lisinopril (PRINIVIL,ZESTRIL) 10 MG tablet, TAKE 1 TABLET BY MOUTH DAILY, Disp: 90 tablet, Rfl: 1 .  miconazole (MICATIN) 2 % cream, Apply 1 application topically 2 (two) times daily., Disp: 60 g, Rfl: 0 .  Multiple Vitamin (MULTIVITAMIN) capsule, Take 1 capsule by mouth daily with lunch. , Disp: , Rfl:  .  mupirocin ointment (BACTROBAN) 2 %, Apply 1 application topically 2 (two) times daily. Left 3rd toe, Disp: 30 g, Rfl: 0 .  naproxen sodium (ALEVE) 220 MG tablet, Take 220 mg by mouth 2 (two) times daily as needed (for pain or headache)., Disp: , Rfl:  .  nystatin (MYCOSTATIN) 100000 UNIT/ML suspension, Take 5 mLs (500,000 Units total) by mouth 4 (four) times daily., Disp: 473 mL, Rfl: 0 .  Omega-3 Fatty Acids (FISH OIL PO), Take  1 capsule by mouth daily. , Disp: , Rfl:  .  pantoprazole (PROTONIX) 40 MG tablet, Take 1 tablet (40 mg total) by mouth 2 (two) times daily. **PLEASE SCHEDULE FOLLOW UP APPT**, Disp: 60 tablet, Rfl: 6 .  Polyvinyl Alcohol-Povidone (MURINE TEARS FOR DRY EYES OP), Place 1-2 drops into both eyes daily as needed (for dry eyes)., Disp: , Rfl:  .  Potassium 99 MG TABS, Take 1 tablet by mouth daily., Disp: , Rfl:  .  pseudoephedrine-guaifenesin (MUCINEX D) 60-600 MG 12 hr tablet, Take 1 tablet by mouth every 12 (twelve) hours., Disp: , Rfl:  .  psyllium (METAMUCIL) 58.6 % powder, Take 1 packet by mouth at bedtime. , Disp: , Rfl:  .  rosuvastatin (CRESTOR) 10 MG tablet, Take 1 tablet (10 mg total)  by mouth daily., Disp: 90 tablet, Rfl: 3 .  tamsulosin (FLOMAX) 0.4 MG CAPS capsule, TAKE 1 CAPSULE(0.4 MG) BY MOUTH DAILY (Patient taking differently: Take 0.4 mg by mouth daily. ), Disp: 90 capsule, Rfl: 11 .  traMADol (ULTRAM) 50 MG tablet, TAKE 1 TABLET(50 MG) BY MOUTH EVERY 12 HOURS AS NEEDED, Disp: 60 tablet, Rfl: 1 .  glipiZIDE (GLUCOTROL XL) 10 MG 24 hr tablet, Take 1 tablet (10 mg total) by mouth daily with breakfast., Disp: 90 tablet, Rfl: 1  EXAM:  VITALS per patient if applicable:  GENERAL: alert, oriented, appears well and in no acute distress  HEENT: atraumatic, conjunttiva clear, no obvious abnormalities on inspection of external nose and ears  NECK: normal movements of the head and neck  LUNGS: on inspection no signs of respiratory distress, breathing rate appears normal, no obvious gross SOB, gasping or wheezing  CV: no obvious cyanosis  MS: moves all visible extremities without noticeable abnormality  PSYCH/NEURO: pleasant and cooperative, no obvious depression or anxiety, speech and thought processing grossly intact  ASSESSMENT AND PLAN:  Discussed the following assessment and plan:  Acute non-recurrent maxillary sinusitis - Plan: SAR CoV2 Serology (COVID  19)AB(IGG)IA  Controlled type 2 diabetes mellitus with complication, without long-term current use of insulin (HCC) - Plan: glipiZIDE (GLUCOTROL XL) 10 MG 24 hr tablet  Skin lesion  Pruritus  Right renal mass     Problem List Items Addressed This Visit      Respiratory   Acute non-recurrent maxillary sinusitis - Primary    Pending COVID antibody testing.  Long discussion regards to limitations of interpretation of this, because of false postives, false negatives.  Patient understands and would like to be tested.       Relevant Orders   SAR CoV2 Serology (COVID 19)AB(IGG)IA     Endocrine   Controlled type 2 diabetes mellitus with complication, without long-term current use of insulin (HCC)    Trial increase of glipizide.       Relevant Medications   glipiZIDE (GLUCOTROL XL) 10 MG 24 hr tablet     Musculoskeletal and Integument   Skin lesion    Largely resolved.   In the setting with patient's squamous cell cancer and melanoma history, I did strongly emphasize the importance of him following up with dermatology. It   Does not appear to be infected at this time, healing well.  Patient will let me know does not continue to do so and he understands to speak with dermatology in regard to this      Pruritus    Cellulitis is from frequent handwashing, use of peroxide etc.  Advised emollient therapy.  Less likely think is related to Zetia, Crestor.  Patient will let me know if does not resolve.        Other   Right renal mass    Strongly emphasized the importance of continued follow-up with urology.  Patient verbalized understanding         I discussed the assessment and treatment plan with the patient. The patient was provided an opportunity to ask questions and all were answered. The patient agreed with the plan and demonstrated an understanding of the instructions.   The patient was advised to call back or seek an in-person evaluation if the symptoms worsen or if the  condition fails to improve as anticipated.   [name], [name]

## 2018-06-11 ENCOUNTER — Telehealth: Payer: Self-pay | Admitting: Family

## 2018-06-11 NOTE — Telephone Encounter (Unsigned)
Copied from [place] [phone]. Topic: General - Other >> Jun 11, 2018  1:05 PM Pauline Good wrote: Reason for CRM: pt want to have the antibody test for Covid

## 2018-06-12 ENCOUNTER — Encounter: Payer: Self-pay | Admitting: Family

## 2018-06-12 DIAGNOSIS — L989 Disorder of the skin and subcutaneous tissue, unspecified: Secondary | ICD-10-CM | POA: Insufficient documentation

## 2018-06-12 DIAGNOSIS — L299 Pruritus, unspecified: Secondary | ICD-10-CM | POA: Insufficient documentation

## 2018-06-12 NOTE — Assessment & Plan Note (Signed)
Cellulitis is from frequent handwashing, use of peroxide etc.  Advised emollient therapy.  Less likely think is related to Zetia, Crestor.  Patient will let me know if does not resolve.

## 2018-06-12 NOTE — Assessment & Plan Note (Signed)
Pending COVID antibody testing.  Long discussion regards to limitations of interpretation of this, because of false postives, false negatives.  Patient understands and would like to be tested.

## 2018-06-12 NOTE — Telephone Encounter (Signed)
Patient scheduled for testing.

## 2018-06-12 NOTE — Assessment & Plan Note (Signed)
Strongly emphasized the importance of continued follow-up with urology.  Patient verbalized understanding

## 2018-06-12 NOTE — Assessment & Plan Note (Signed)
Trial increase of glipizide.

## 2018-06-12 NOTE — Assessment & Plan Note (Signed)
Largely resolved.   In the setting with patient's squamous cell cancer and melanoma history, I did strongly emphasize the importance of him following up with dermatology. It   Does not appear to be infected at this time, healing well.  Patient will let me know does not continue to do so and he understands to speak with dermatology in regard to this

## 2018-06-13 ENCOUNTER — Other Ambulatory Visit: Payer: Self-pay | Admitting: Family

## 2018-06-15 NOTE — Telephone Encounter (Signed)
I looked up patient on [place] Controlled Substances Reporting System and saw no activity that raised concern of inappropriate use.   

## 2018-06-16 ENCOUNTER — Other Ambulatory Visit: Payer: Medicare Other

## 2018-06-17 DIAGNOSIS — Z85828 Personal history of other malignant neoplasm of skin: Secondary | ICD-10-CM | POA: Diagnosis not present

## 2018-06-17 DIAGNOSIS — Z08 Encounter for follow-up examination after completed treatment for malignant neoplasm: Secondary | ICD-10-CM | POA: Diagnosis not present

## 2018-06-17 DIAGNOSIS — X32XXXA Exposure to sunlight, initial encounter: Secondary | ICD-10-CM | POA: Diagnosis not present

## 2018-06-17 DIAGNOSIS — Z85820 Personal history of malignant melanoma of skin: Secondary | ICD-10-CM | POA: Diagnosis not present

## 2018-06-17 DIAGNOSIS — D485 Neoplasm of uncertain behavior of skin: Secondary | ICD-10-CM | POA: Diagnosis not present

## 2018-06-17 DIAGNOSIS — L570 Actinic keratosis: Secondary | ICD-10-CM | POA: Diagnosis not present

## 2018-06-17 DIAGNOSIS — L0101 Non-bullous impetigo: Secondary | ICD-10-CM | POA: Diagnosis not present

## 2018-06-17 DIAGNOSIS — L814 Other melanin hyperpigmentation: Secondary | ICD-10-CM | POA: Diagnosis not present

## 2018-06-22 ENCOUNTER — Telehealth: Payer: Self-pay | Admitting: *Deleted

## 2018-06-22 NOTE — Telephone Encounter (Signed)
Copied from [place] [id number]. Topic: General - Call Back - No Documentation >> Jun 22, 2018 10:00 AM Erick Blinks wrote: High BS reported, Pt requesting call back to discuss possible appointment/questions about medications.  [phone]

## 2018-06-23 DIAGNOSIS — L570 Actinic keratosis: Secondary | ICD-10-CM | POA: Diagnosis not present

## 2018-06-23 NOTE — Telephone Encounter (Signed)
I spoke with patient & scheduled doxy to discuss diabetes. He also wanted to possibly see Catie for management of his diabetes medications.

## 2018-06-24 ENCOUNTER — Encounter: Payer: Self-pay | Admitting: Family

## 2018-06-24 ENCOUNTER — Ambulatory Visit (INDEPENDENT_AMBULATORY_CARE_PROVIDER_SITE_OTHER): Payer: Medicare Other | Admitting: Family

## 2018-06-24 DIAGNOSIS — E118 Type 2 diabetes mellitus with unspecified complications: Secondary | ICD-10-CM | POA: Diagnosis not present

## 2018-06-24 MED ORDER — METFORMIN HCL 500 MG PO TABS: 500.0000 mg | ORAL_TABLET | Freq: Every day | ORAL | 1 refills | Status: DC

## 2018-06-24 NOTE — Patient Instructions (Addendum)
Trial of metformin 500mg .  Let me know how you are doing.  Recheck a1c after 09/03/18.   Stay safe!

## 2018-06-24 NOTE — Progress Notes (Signed)
This visit type was conducted due to national recommendations for restrictions regarding the COVID-19 pandemic (e.g. social distancing).  This format is felt to be most appropriate for this patient at this time.  All issues noted in this document were discussed and addressed.  No physical exam was performed (except for noted visual exam findings with Video Visits). Virtual Visit via Video Note  I connected with@  on 06/24/18 at  9:00 AM EDT by a video enabled telemedicine application and verified that I am speaking with the correct person using two identifiers.  Location patient: home Location provider:work  Persons participating in the virtual visit: patient, provider  I discussed the limitations of evaluation and management by telemedicine and the availability of in person appointments. The patient expressed understanding and agreed to proceed.   HPI:  Feels well. Concerned with elevated blood sugars.  DM- FBG 160, 156, 157 last 3 days. Prior to supper 196 after 'light lunch' which prompted call to our office.  Changed off of metformin to glipizide from [name] as thought causing leg cramps.   He also started on potassium OTC due leg cramps which have resolved completely.   Thinks increase in blood sugar corresponds with change to rosuvastatin and zetia.    ROS: See pertinent positives and negatives per HPI.  Past Medical History:  Diagnosis Date  . Aortic insufficiency    a. noted on TTE 2015  . Arthritis   . CAD (coronary artery disease)    a. remote PCI in 1991 and 2005; b. MV 3/15: old inferior MI, no ischemia, LVEF 50%, slight inferior wall hypokniesis  . Chicken pox   . Colon polyps    4 pre-cancerous   . Diverticulitis   . DM type 2 (diabetes mellitus, type 2) ([place])   . Family history of adverse reaction to anesthesia   . GERD (gastroesophageal reflux disease)   . Heart murmur   . History of kidney stones   . Hyperlipidemia   . Hypertension   . Kidney stones   .  Melanoma ([place]) 1980   Resected from his back  . Mitral regurgitation    a. noted on TTE 2015  . Myocardial infarction ([place])   . Systolic dysfunction    a. TTE 2015: EF  50-55%, mild global hypokinesis, mild to moderate aortic sclerosis, mild aortic insufficiency, mild to moderate mitral regurgitation, mild tricuspid regurgitation, moderately dilated left atrium, mildly dilated right ventricle, PASP 31 mmHg    Past Surgical History:  Procedure Laterality Date  . CHOLECYSTECTOMY  2012  . COLONOSCOPY    . CORONARY ANGIOPLASTY WITH STENT PLACEMENT  1991 & 2005  . CYSTOSCOPY/URETEROSCOPY/HOLMIUM LASER/STENT PLACEMENT Right 01/02/2018   Procedure: CYSTOSCOPY/URETEROSCOPY/HOLMIUM LASER/STENT PLACEMENT;  Surgeon: [name], MD;  Location: [facility] ORS;  Service: Urology;  Laterality: Right;  . ESOPHAGOGASTRODUODENOSCOPY (EGD) WITH PROPOFOL N/A 11/19/2016   Procedure: ESOPHAGOGASTRODUODENOSCOPY (EGD) WITH PROPOFOL;  Surgeon: [name], MD;  Location: [facility] ENDOSCOPY;  Service: Endoscopy;  Laterality: N/A;  . LITHOTRIPSY  2015  . MELANOMA EXCISION  1980   malignant  . NERVE SURGERY  2015   ulna nerve  . TONSILLECTOMY  1945  . WISDOM TOOTH EXTRACTION      Family History  Problem Relation Age of Onset  . Hyperlipidemia Mother   . Hypertension Mother   . Heart disease Mother   . Diabetes Mother   . Colon cancer Father   . Lung cancer Father   . Kidney cancer Father  malignant capsulated kidney tumor  . Diabetes Father   . Bladder Cancer Neg Hx   . Prostate cancer Neg Hx     SOCIAL HX: smoker   Current Outpatient Medications:  .  acetaminophen (TYLENOL) 650 MG CR tablet, Take 650 mg by mouth every 8 (eight) hours as needed for pain., Disp: , Rfl:  .  aspirin EC 81 MG tablet, Take 81 mg by mouth daily. , Disp: , Rfl:  .  carvedilol (COREG) 3.125 MG tablet, TAKE 1 TABLET(3.125 MG) BY MOUTH TWICE DAILY, Disp: 180 tablet, Rfl: 0 .  CONTOUR TEST test strip, USE AS DIRECTED,  Disp: 100 each, Rfl: 0 .  ezetimibe (ZETIA) 10 MG tablet, Take 1 tablet (10 mg total) by mouth daily., Disp: 90 tablet, Rfl: 3 .  fluticasone (FLONASE) 50 MCG/ACT nasal spray, Place 2 sprays into both nostrils daily. (Patient taking differently: Place 2 sprays into both nostrils daily as needed for allergies. ), Disp: 16 g, Rfl: 6 .  glipiZIDE (GLUCOTROL XL) 10 MG 24 hr tablet, Take 1 tablet (10 mg total) by mouth daily with breakfast., Disp: 90 tablet, Rfl: 1 .  glucose blood test strip, Use as instructed to check blood sugar up to three times daily. E11.42, Disp: 100 each, Rfl: 12 .  hydrochlorothiazide (HYDRODIURIL) 25 MG tablet, TAKE 1 TABLET(25 MG) BY MOUTH DAILY, Disp: 90 tablet, Rfl: 0 .  lisinopril (PRINIVIL,ZESTRIL) 10 MG tablet, TAKE 1 TABLET BY MOUTH DAILY, Disp: 90 tablet, Rfl: 1 .  metFORMIN (GLUCOPHAGE) 500 MG tablet, Take 1 tablet (500 mg total) by mouth daily with breakfast., Disp: 90 tablet, Rfl: 1 .  miconazole (MICATIN) 2 % cream, Apply 1 application topically 2 (two) times daily., Disp: 60 g, Rfl: 0 .  Multiple Vitamin (MULTIVITAMIN) capsule, Take 1 capsule by mouth daily with lunch. , Disp: , Rfl:  .  mupirocin ointment (BACTROBAN) 2 %, Apply 1 application topically 2 (two) times daily. Left 3rd toe, Disp: 30 g, Rfl: 0 .  naproxen sodium (ALEVE) 220 MG tablet, Take 220 mg by mouth 2 (two) times daily as needed (for pain or headache)., Disp: , Rfl:  .  nystatin (MYCOSTATIN) 100000 UNIT/ML suspension, Take 5 mLs (500,000 Units total) by mouth 4 (four) times daily., Disp: 473 mL, Rfl: 0 .  Omega-3 Fatty Acids (FISH OIL PO), Take 1 capsule by mouth daily. , Disp: , Rfl:  .  pantoprazole (PROTONIX) 40 MG tablet, Take 1 tablet (40 mg total) by mouth 2 (two) times daily. **PLEASE SCHEDULE FOLLOW UP APPT**, Disp: 60 tablet, Rfl: 6 .  Polyvinyl Alcohol-Povidone (MURINE TEARS FOR DRY EYES OP), Place 1-2 drops into both eyes daily as needed (for dry eyes)., Disp: , Rfl:  .  Potassium 99 MG  TABS, Take 1 tablet by mouth daily., Disp: , Rfl:  .  pseudoephedrine-guaifenesin (MUCINEX D) 60-600 MG 12 hr tablet, Take 1 tablet by mouth every 12 (twelve) hours., Disp: , Rfl:  .  psyllium (METAMUCIL) 58.6 % powder, Take 1 packet by mouth at bedtime. , Disp: , Rfl:  .  rosuvastatin (CRESTOR) 10 MG tablet, Take 1 tablet (10 mg total) by mouth daily., Disp: 90 tablet, Rfl: 3 .  tamsulosin (FLOMAX) 0.4 MG CAPS capsule, TAKE 1 CAPSULE(0.4 MG) BY MOUTH DAILY (Patient taking differently: Take 0.4 mg by mouth daily. ), Disp: 90 capsule, Rfl: 11 .  traMADol (ULTRAM) 50 MG tablet, TAKE 1 TABLET(50 MG) BY MOUTH EVERY 12 HOURS AS NEEDED, Disp: 60 tablet, Rfl: 1  EXAM:  VITALS per patient if applicable:  GENERAL: alert, oriented, appears well and in no acute distress  HEENT: atraumatic, conjunttiva clear, no obvious abnormalities on inspection of external nose and ears  NECK: normal movements of the head and neck  LUNGS: on inspection no signs of respiratory distress, breathing rate appears normal, no obvious gross SOB, gasping or wheezing  CV: no obvious cyanosis  MS: moves all visible extremities without noticeable abnormality  PSYCH/NEURO: pleasant and cooperative, no obvious depression or anxiety, speech and thought processing grossly intact  ASSESSMENT AND PLAN:  Discussed the following assessment and plan:  Controlled type 2 diabetes mellitus with complication, without long-term current use of insulin (HCC) - Plan: Hemoglobin A1c  Problem List Items Addressed This Visit      Endocrine   Controlled type 2 diabetes mellitus with complication, without long-term current use of insulin (HCC) - Primary    Restart metformin 500mg  qam. Patient will monitor for side effects. Will recheck a1c 08/2018.       Relevant Medications   metFORMIN (GLUCOPHAGE) 500 MG tablet   Other Relevant Orders   Hemoglobin A1c        I discussed the assessment and treatment plan with the patient. The  patient was provided an opportunity to ask questions and all were answered. The patient agreed with the plan and demonstrated an understanding of the instructions.   The patient was advised to call back or seek an in-person evaluation if the symptoms worsen or if the condition fails to improve as anticipated.   [name], [name]   I spent 21 min non face to face w/ pt.

## 2018-06-24 NOTE — Assessment & Plan Note (Signed)
Restart metformin 500mg  qam. Patient will monitor for side effects. Will recheck a1c 08/2018.

## 2018-07-01 ENCOUNTER — Other Ambulatory Visit: Payer: Self-pay

## 2018-07-01 ENCOUNTER — Ambulatory Visit (INDEPENDENT_AMBULATORY_CARE_PROVIDER_SITE_OTHER): Payer: Medicare Other | Admitting: Family

## 2018-07-01 ENCOUNTER — Encounter: Payer: Self-pay | Admitting: Family

## 2018-07-01 DIAGNOSIS — L989 Disorder of the skin and subcutaneous tissue, unspecified: Secondary | ICD-10-CM

## 2018-07-01 DIAGNOSIS — M25511 Pain in right shoulder: Secondary | ICD-10-CM | POA: Diagnosis not present

## 2018-07-01 DIAGNOSIS — E118 Type 2 diabetes mellitus with unspecified complications: Secondary | ICD-10-CM | POA: Diagnosis not present

## 2018-07-01 DIAGNOSIS — G8929 Other chronic pain: Secondary | ICD-10-CM | POA: Diagnosis not present

## 2018-07-01 DIAGNOSIS — I25118 Atherosclerotic heart disease of native coronary artery with other forms of angina pectoris: Secondary | ICD-10-CM | POA: Diagnosis not present

## 2018-07-01 NOTE — Patient Instructions (Addendum)
a1c in 2 months  Start bactroban; let me know if lesion does not RESOLVE  Today we discussed referrals, orders. Physical therapy   I have placed these orders in the system for you.  Please be sure to give [place] a call if you have not heard from our office regarding this. We should hear from [place] within ONE week with information regarding your appointment. If not, please let me know immediately.   If pain in right shoulder worsens, stop physical therapy immediately and let me know so we can pursue MRI.   Stay safe!

## 2018-07-01 NOTE — Progress Notes (Signed)
This visit type was conducted due to national recommendations for restrictions regarding the COVID-19 pandemic (e.g. social distancing).  This format is felt to be most appropriate for this patient at this time.  All issues noted in this document were discussed and addressed.  No physical exam was performed (except for noted visual exam findings with Video Visits). Virtual Visit via Video Note  I connected with@  on 07/03/18 at  4:00 PM EDT by a video enabled telemedicine application and verified that I am speaking with the correct person using two identifiers.  Location patient: home Location provider:work  Persons participating in the virtual visit: patient, provider  I discussed the limitations of evaluation and management by telemedicine and the availability of in person appointments. The patient expressed understanding and agreed to proceed.   HPI:  Chief complaint: elevated blood sugars.  DM- stopped metformin completely due to diarrhea.  FBG 145, 154, and now on glipizide 5mg   BID ( old prescription) 145, 141, 131. Feels like trending down on glipizide 5mg  BID  Random blood glucose reading of 188 after 2 hotdogs.   Also complains of having right shoulder pain, 2 months, unchanged.  Feels better when taking warm shower. Pain with lifting cup of coffee. Thinks from old sports injury. Lifting can be painful over 'ac joint.' No swelling, redness, heat, numbness.  Very rarely uses aleve which is helpful. No falls, known injury.   Has seen [facility] PT and would like to see them again.   Also complains of itchy lesion located posterior left upper arm proximal to elbow, noticed today. Surrounding skin looks dry, scaly.  Not sure if knocked it and caused tear. Not aware that there was a mole there. Not tender.  Not bleeding. No purulent discharge. No fever.   No h/o MRSA.   H/o skin cancer; follows with Dr Evorn Gong   ROS: See pertinent positives and negatives per HPI.  Past Medical  History:  Diagnosis Date  . Aortic insufficiency    a. noted on TTE 2015  . Arthritis   . CAD (coronary artery disease)    a. remote PCI in 1991 and 2005; b. MV 3/15: old inferior MI, no ischemia, LVEF 50%, slight inferior wall hypokniesis  . Chicken pox   . Colon polyps    4 pre-cancerous   . Diverticulitis   . DM type 2 (diabetes mellitus, type 2) ([place])   . Family history of adverse reaction to anesthesia   . GERD (gastroesophageal reflux disease)   . Heart murmur   . History of kidney stones   . Hyperlipidemia   . Hypertension   . Kidney stones   . Melanoma ([place]) 1980   Resected from his back  . Mitral regurgitation    a. noted on TTE 2015  . Myocardial infarction ([place])   . Systolic dysfunction    a. TTE 2015: EF  50-55%, mild global hypokinesis, mild to moderate aortic sclerosis, mild aortic insufficiency, mild to moderate mitral regurgitation, mild tricuspid regurgitation, moderately dilated left atrium, mildly dilated right ventricle, PASP 31 mmHg    Past Surgical History:  Procedure Laterality Date  . CHOLECYSTECTOMY  2012  . COLONOSCOPY    . CORONARY ANGIOPLASTY WITH STENT PLACEMENT  1991 & 2005  . CYSTOSCOPY/URETEROSCOPY/HOLMIUM LASER/STENT PLACEMENT Right 01/02/2018   Procedure: CYSTOSCOPY/URETEROSCOPY/HOLMIUM LASER/STENT PLACEMENT;  Surgeon: [name], MD;  Location: [facility] ORS;  Service: Urology;  Laterality: Right;  . ESOPHAGOGASTRODUODENOSCOPY (EGD) WITH PROPOFOL N/A 11/19/2016   Procedure: ESOPHAGOGASTRODUODENOSCOPY (EGD) WITH  PROPOFOL;  Surgeon: [name], MD;  Location[facility] ENDOSCOPY;  Service: Endoscopy;  Laterality: N/A;  . LITHOTRIPSY  2015  . MELANOMA EXCISION  1980   malignant  . NERVE SURGERY  2015   ulna nerve  . TONSILLECTOMY  1945  . WISDOM TOOTH EXTRACTION      Family History  Problem Relation Age of Onset  . Hyperlipidemia Mother   . Hypertension Mother   . Heart disease Mother   . Diabetes Mother   . Colon cancer Father   .  Lung cancer Father   . Kidney cancer Father        malignant capsulated kidney tumor  . Diabetes Father   . Bladder Cancer Neg Hx   . Prostate cancer Neg Hx     SOCIAL HX: smoker   Current Outpatient Medications:  .  aspirin EC 81 MG tablet, Take 81 mg by mouth daily. , Disp: , Rfl:  .  carvedilol (COREG) 3.125 MG tablet, TAKE 1 TABLET(3.125 MG) BY MOUTH TWICE DAILY, Disp: 180 tablet, Rfl: 0 .  CONTOUR TEST test strip, USE AS DIRECTED, Disp: 100 each, Rfl: 0 .  ezetimibe (ZETIA) 10 MG tablet, Take 1 tablet (10 mg total) by mouth daily., Disp: 90 tablet, Rfl: 3 .  fluticasone (FLONASE) 50 MCG/ACT nasal spray, Place 2 sprays into both nostrils daily. (Patient taking differently: Place 2 sprays into both nostrils daily as needed for allergies. ), Disp: 16 g, Rfl: 6 .  glipiZIDE (GLUCOTROL) 5 MG tablet, Take by mouth 2 (two) times a day. Take one pill daily before breakfast and one pill in the evening before dinner., Disp: , Rfl:  .  glucose blood test strip, Use as instructed to check blood sugar up to three times daily. E11.42, Disp: 100 each, Rfl: 12 .  hydrochlorothiazide (HYDRODIURIL) 25 MG tablet, TAKE 1 TABLET(25 MG) BY MOUTH DAILY, Disp: 90 tablet, Rfl: 0 .  lisinopril (PRINIVIL,ZESTRIL) 10 MG tablet, TAKE 1 TABLET BY MOUTH DAILY, Disp: 90 tablet, Rfl: 1 .  miconazole (MICATIN) 2 % cream, Apply 1 application topically 2 (two) times daily., Disp: 60 g, Rfl: 0 .  Multiple Vitamin (MULTIVITAMIN) capsule, Take 1 capsule by mouth daily with lunch. , Disp: , Rfl:  .  naproxen sodium (ALEVE) 220 MG tablet, Take 220 mg by mouth 2 (two) times daily as needed (for pain or headache)., Disp: , Rfl:  .  Omega-3 Fatty Acids (FISH OIL PO), Take 1 capsule by mouth daily. , Disp: , Rfl:  .  pantoprazole (PROTONIX) 40 MG tablet, Take 1 tablet (40 mg total) by mouth 2 (two) times daily. **PLEASE SCHEDULE FOLLOW UP APPT**, Disp: 60 tablet, Rfl: 6 .  Polyvinyl Alcohol-Povidone (MURINE TEARS FOR DRY EYES  OP), Place 1-2 drops into both eyes daily as needed (for dry eyes)., Disp: , Rfl:  .  Potassium 99 MG TABS, Take 1 tablet by mouth daily., Disp: , Rfl:  .  pseudoephedrine-guaifenesin (MUCINEX D) 60-600 MG 12 hr tablet, Take 1 tablet by mouth every 12 (twelve) hours., Disp: , Rfl:  .  psyllium (METAMUCIL) 58.6 % powder, Take 1 packet by mouth at bedtime. , Disp: , Rfl:  .  rosuvastatin (CRESTOR) 10 MG tablet, Take 1 tablet (10 mg total) by mouth daily., Disp: 90 tablet, Rfl: 3 .  tamsulosin (FLOMAX) 0.4 MG CAPS capsule, TAKE 1 CAPSULE(0.4 MG) BY MOUTH DAILY (Patient taking differently: Take 0.4 mg by mouth daily. ), Disp: 90 capsule, Rfl: 11 .  traMADol (ULTRAM)  50 MG tablet, TAKE 1 TABLET(50 MG) BY MOUTH EVERY 12 HOURS AS NEEDED, Disp: 60 tablet, Rfl: 1 .  acetaminophen (TYLENOL) 650 MG CR tablet, Take 650 mg by mouth every 8 (eight) hours as needed for pain., Disp: , Rfl:  .  mupirocin ointment (BACTROBAN) 2 %, Apply 1 application topically 2 (two) times daily. Left 3rd toe, Disp: 30 g, Rfl: 0 .  nystatin (MYCOSTATIN) 100000 UNIT/ML suspension, Take 5 mLs (500,000 Units total) by mouth 4 (four) times daily., Disp: 473 mL, Rfl: 0  EXAM:  VITALS per patient if applicable:  GENERAL: alert, oriented, appears well and in no acute distress  HEENT: atraumatic, conjunttiva clear, no obvious abnormalities on inspection of external nose and ears  NECK: normal movements of the head and neck  LUNGS: on inspection no signs of respiratory distress, breathing rate appears normal, no obvious gross SOB, gasping or wheezing  CV: no obvious cyanosis  MS: Over video, patient is able to make lateral arm raises,  full range of motion bilaterally.  He endorses pain with right arm elevation and resistance using left arm.  No obvious swelling.  PSYCH/NEURO: pleasant and cooperative, no obvious depression or anxiety, speech and thought processing grossly intact  SKIN: Left arm scab-like lesion appreciated over  video , over posterior left upper arm proximal to olecranon.  No erythema, purulent discharge, edema appreciated.  ASSESSMENT AND PLAN:  Discussed the following assessment and plan:  Problem List Items Addressed This Visit      Endocrine   Controlled type 2 diabetes mellitus with complication, without long-term current use of insulin ([place])    Improved.  Advised patient to continue glipizide 5 mg twice daily.  He will remain off of metformin.  Advised A1c recheck in 2 months.      Relevant Medications   glipiZIDE (GLUCOTROL) 5 MG tablet     Musculoskeletal and Integument   Skin lesion    Duration of 1 day.  Etiology nonspecific. Considering localized infection versus skin tear.  Advised Bactroban with close vigilance.  Patient let me know if no improvement.        Other   Right shoulder pain - Primary    Acute on chronic.  Based on description of pain, pain with resistance from left arm, I do suspect rotator cuff pathology.  No obvious impingement or limited range of motion seen on video today.  Patient feels very comfortable pursuing physical therapy.  He politely declines MRI at this time.  He will let me know if he would like MRI or orthopedic consult.   advised him that if physical therapy causes more pain, stop immediately and let me know.  Patient verbalized understanding      Relevant Orders   Ambulatory referral to Physical Therapy        I discussed the assessment and treatment plan with the patient. The patient was provided an opportunity to ask questions and all were answered. The patient agreed with the plan and demonstrated an understanding of the instructions.   The patient was advised to call back or seek an in-person evaluation if the symptoms worsen or if the condition fails to improve as anticipated.   [name], [name]

## 2018-07-03 DIAGNOSIS — M25511 Pain in right shoulder: Secondary | ICD-10-CM | POA: Insufficient documentation

## 2018-07-03 NOTE — Assessment & Plan Note (Signed)
Improved.  Advised patient to continue glipizide 5 mg twice daily.  He will remain off of metformin.  Advised A1c recheck in 2 months.

## 2018-07-03 NOTE — Assessment & Plan Note (Signed)
Acute on chronic.  Based on description of pain, pain with resistance from left arm, I do suspect rotator cuff pathology.  No obvious impingement or limited range of motion seen on video today.  Patient feels very comfortable pursuing physical therapy.  He politely declines MRI at this time.  He will let me know if he would like MRI or orthopedic consult.   advised him that if physical therapy causes more pain, stop immediately and let me know.  Patient verbalized understanding

## 2018-07-03 NOTE — Assessment & Plan Note (Signed)
Duration of 1 day.  Etiology nonspecific. Considering localized infection versus skin tear.  Advised Bactroban with close vigilance.  Patient let me know if no improvement.

## 2018-07-06 ENCOUNTER — Telehealth: Payer: Self-pay

## 2018-07-06 ENCOUNTER — Ambulatory Visit: Payer: Medicare Other | Admitting: Family

## 2018-07-06 ENCOUNTER — Ambulatory Visit: Payer: Medicare Other

## 2018-07-06 NOTE — Telephone Encounter (Signed)
Copied from [place] [id number]. Topic: Referral - Question >> Jul 06, 2018  9:53 AM Reyne Dumas L wrote: Reason for CRM:   Pt states he was referred to a Physical Therapist.  Pt states it was [employer] but they can't see Epic orders or his records and they need all of that faxed over.  Pt states that the fax number for them is [phone]. Pt can be reached at [phone].

## 2018-07-08 DIAGNOSIS — M542 Cervicalgia: Secondary | ICD-10-CM | POA: Diagnosis not present

## 2018-07-08 DIAGNOSIS — M25511 Pain in right shoulder: Secondary | ICD-10-CM | POA: Diagnosis not present

## 2018-07-10 DIAGNOSIS — M25511 Pain in right shoulder: Secondary | ICD-10-CM | POA: Diagnosis not present

## 2018-07-10 DIAGNOSIS — M542 Cervicalgia: Secondary | ICD-10-CM | POA: Diagnosis not present

## 2018-07-13 ENCOUNTER — Ambulatory Visit: Payer: Medicare Other | Admitting: Pharmacist

## 2018-07-13 ENCOUNTER — Telehealth: Payer: Self-pay | Admitting: Pharmacist

## 2018-07-13 DIAGNOSIS — M25511 Pain in right shoulder: Secondary | ICD-10-CM | POA: Diagnosis not present

## 2018-07-13 DIAGNOSIS — M542 Cervicalgia: Secondary | ICD-10-CM | POA: Diagnosis not present

## 2018-07-13 DIAGNOSIS — E118 Type 2 diabetes mellitus with unspecified complications: Secondary | ICD-10-CM

## 2018-07-13 MED ORDER — BLOOD GLUCOSE MONITOR KIT: PACK | 0 refills | Status: DC

## 2018-07-13 NOTE — Telephone Encounter (Signed)
S:     Chief Complaint  Patient presents with  . Medication Management    Diabetes    Patient contacted for diabetes evaluation, education, and management at the request of [name] [name]/Dr. Derrel Nip (referred on 06/23/2018). Last seen by primary care provider on 07/01/2018 - at that time, glipzide was reduced to 5 mg BID.   Notes that blood sugars have become more uncontrolled over the past year, specifically having concerns over the past few weeks. Having blood sugar spikes, though notes that these times were likely related to having an infection. He is pleased with his sugar control currently. Notes he would like a new glucometer prescription sent in.   He notes that a few months ago, he started taking his HCTZ in the evening due to literature he read supporting the CV risk reduction benefit. Does note nocturia 2-3 times a night.   Insurance coverage/medication affordability: [employer]  Patient reports adherence with medications.  Current diabetes medications include: glipizide 5 mg BID Current hypertension medications include: lisinopril 10 mg QAM, HCTZ 25 mg QPM, carvedilol 3.125 mg BID Current hyperlipidemic medications include: rosuvastatin 10 mg QHS, ezetimibe 10 mg QHS  Patient denies hypoglycemic s/sx including dizziness, shakiness, sweating. Does report sleepiness after meals, wonders if this is related to hyperglycemia or just age.    O:   Lab Results  Component Value Date   HGBA1C 7.8 (H) 96/28/3662    Basic Metabolic Panel BMP Latest Ref Rng & Units 06/03/2018 03/02/2018 12/22/2017  Glucose 70 - 99 mg/dL 158(H) 151(H) 163(H)  BUN 6 - 23 mg/dL 17 19 24(H)  Creatinine 0.40 - 1.50 mg/dL 0.70 0.86 1.07  Sodium 135 - 145 mEq/L 138 139 138  Potassium 3.5 - 5.1 mEq/L 3.9 3.7 3.4(L)  Chloride 96 - 112 mEq/L 102 103 107  CO2 19 - 32 mEq/L 26 26 25   Calcium 8.4 - 10.5 mg/dL 9.6 9.8 9.3    Lipid Panel     Component Value Date/Time   CHOL 136 03/02/2018 0822    TRIG 143.0 03/02/2018 0822   HDL 36.50 (L) 03/02/2018 0822   CHOLHDL 4 03/02/2018 0822   VLDL 28.6 03/02/2018 0822   LDLCALC 70 03/02/2018 0822   LDLDIRECT 49.0 05/01/2016 0940    Self-Monitored Blood Glucose Results Date Fasting Afternoon/ Pre Supper Bedtime  28-Jun  138   26-Jun  155   24-Jun  129   22-Jun  143   19-Jun  133   18-Jun  126   17-Jun 142 131 132   14 day average 146  Clinical ASCVD: No  The 10-year ASCVD risk score Mikey Bussing DC Jr., et al., 2013) is: 58.9%   Values used to calculate the score:     Age: 78 years     Sex: Male     Is Non-Hispanic African American: No     Diabetic: Yes     Tobacco smoker: Yes     Systolic Blood Pressure: 947 mmHg     Is BP treated: Yes     HDL Cholesterol: 36.5 mg/dL     Total Cholesterol: 136 mg/dL    A/P: #Diabetes - Currently uncontrolled, most recent A1c 7.8% on 06/03/2018, though a more relaxed A1c of goal of <8% may be appropriate given patient age. Denies hypoglycemia; notes that recent increase in blood sugars has coincided with less physical activity - Continue glipizide 5 mg BID. Counseled to reduce AM dose to 2.5 mg if he becomes more physically active and develops  hypoglycemia during the day - Discussed mechanism of action of SGLT2; may consider in the future if optimal blood sugar control with minimal hypoglycemia is not obtained with glipizide - Encouraged increased physical activity to target blood sugar control.  - Next A1C anticipated August 2020.   #Hypertension currently controlled per most recent office readings.  Goal BP <140/90, though more stringent goal of <130/80 may be appropriate given patient's CV disease risk.  Nocturia likely related to dosing HCTZ at night - Switch lisinopril and HCTZ administration - take lisinopril QPM, and move HCTZ to QAM administration - Continue carvedilol 3.125 mg BID.    Follow up in Pharmacist Clinic Visit 4-6 weeks.    De Hollingshead, PharmD, [place] PGY2 Ambulatory  Care Pharmacy Resident Phone: [phone]

## 2018-07-14 NOTE — Telephone Encounter (Signed)
  I have reviewed the above information and agree with above.   Kendelle Schweers, MD 

## 2018-07-15 ENCOUNTER — Other Ambulatory Visit: Payer: Self-pay

## 2018-07-15 DIAGNOSIS — E118 Type 2 diabetes mellitus with unspecified complications: Secondary | ICD-10-CM

## 2018-07-15 MED ORDER — BLOOD GLUCOSE MONITOR KIT: PACK | 0 refills | Status: DC

## 2018-07-15 NOTE — Telephone Encounter (Signed)
Agree with plan as well.   [name], NP

## 2018-07-16 DIAGNOSIS — M542 Cervicalgia: Secondary | ICD-10-CM | POA: Diagnosis not present

## 2018-07-16 DIAGNOSIS — M25511 Pain in right shoulder: Secondary | ICD-10-CM | POA: Diagnosis not present

## 2018-07-21 DIAGNOSIS — M25511 Pain in right shoulder: Secondary | ICD-10-CM | POA: Diagnosis not present

## 2018-07-21 DIAGNOSIS — M542 Cervicalgia: Secondary | ICD-10-CM | POA: Diagnosis not present

## 2018-07-23 DIAGNOSIS — M25511 Pain in right shoulder: Secondary | ICD-10-CM | POA: Diagnosis not present

## 2018-07-23 DIAGNOSIS — M542 Cervicalgia: Secondary | ICD-10-CM | POA: Diagnosis not present

## 2018-07-30 DIAGNOSIS — M25511 Pain in right shoulder: Secondary | ICD-10-CM | POA: Diagnosis not present

## 2018-07-30 DIAGNOSIS — M542 Cervicalgia: Secondary | ICD-10-CM | POA: Diagnosis not present

## 2018-08-06 ENCOUNTER — Ambulatory Visit
Admission: RE | Admit: 2018-08-06 | Discharge: 2018-08-06 | Disposition: A | Discharge status: Home / Self Care | Payer: Medicare Other | Source: Ambulatory Visit | Attending: Urology | Admitting: Urology

## 2018-08-06 ENCOUNTER — Other Ambulatory Visit: Payer: Self-pay

## 2018-08-06 DIAGNOSIS — M542 Cervicalgia: Secondary | ICD-10-CM | POA: Diagnosis not present

## 2018-08-06 DIAGNOSIS — N2889 Other specified disorders of kidney and ureter: Secondary | ICD-10-CM | POA: Diagnosis not present

## 2018-08-06 DIAGNOSIS — N281 Cyst of kidney, acquired: Secondary | ICD-10-CM | POA: Diagnosis not present

## 2018-08-06 DIAGNOSIS — N200 Calculus of kidney: Secondary | ICD-10-CM | POA: Diagnosis not present

## 2018-08-06 DIAGNOSIS — M25511 Pain in right shoulder: Secondary | ICD-10-CM | POA: Diagnosis not present

## 2018-08-06 LAB — POCT I-STAT CREATININE: Creatinine, Ser: 0.9 mg/dL (ref 0.61–1.24)

## 2018-08-06 MED ORDER — GADOBUTROL 1 MMOL/ML IV SOLN
9.0000 mL | Freq: Once | INTRAVENOUS | Status: AC | PRN
  Administered 2018-08-06: 9 mL via INTRAVENOUS

## 2018-08-08 ENCOUNTER — Other Ambulatory Visit: Payer: Self-pay | Admitting: Family

## 2018-08-10 ENCOUNTER — Other Ambulatory Visit: Payer: Self-pay | Admitting: Family

## 2018-08-10 DIAGNOSIS — E118 Type 2 diabetes mellitus with unspecified complications: Secondary | ICD-10-CM

## 2018-08-10 NOTE — Telephone Encounter (Signed)
I looked up patient on [place] Controlled Substances Reporting System and saw no activity that raised concern of inappropriate use.   

## 2018-08-12 ENCOUNTER — Other Ambulatory Visit: Payer: Self-pay | Admitting: Cardiovascular Disease

## 2018-08-12 ENCOUNTER — Other Ambulatory Visit: Payer: Self-pay | Admitting: Family

## 2018-08-13 ENCOUNTER — Encounter: Payer: Self-pay | Admitting: Urology

## 2018-08-13 ENCOUNTER — Ambulatory Visit (INDEPENDENT_AMBULATORY_CARE_PROVIDER_SITE_OTHER): Payer: Medicare Other | Admitting: Urology

## 2018-08-13 ENCOUNTER — Other Ambulatory Visit: Payer: Self-pay

## 2018-08-13 VITALS — BP 116/70 | HR 59 | Ht 73.0 in | Wt 202.0 lb

## 2018-08-13 DIAGNOSIS — N401 Enlarged prostate with lower urinary tract symptoms: Secondary | ICD-10-CM | POA: Diagnosis not present

## 2018-08-13 DIAGNOSIS — N138 Other obstructive and reflux uropathy: Secondary | ICD-10-CM | POA: Diagnosis not present

## 2018-08-13 DIAGNOSIS — N200 Calculus of kidney: Secondary | ICD-10-CM | POA: Diagnosis not present

## 2018-08-13 DIAGNOSIS — N281 Cyst of kidney, acquired: Secondary | ICD-10-CM

## 2018-08-13 NOTE — Progress Notes (Signed)
   08/13/2018 11:11 AM   [name] [name] [birth year] [id number]  Reason for visit: Follow up renal cysts, nephrolithiasis, BPH  HPI: I saw [name] [name] in urology clinic for follow-up of the above.  He is a 78 year old male with extensive history of recurrent stone disease who most recently underwent right-sided ureteroscopy, laser lithotripsy, and stent placement on 01/02/2018, and subsequent stent removal.  His stone type was calcium oxalate.  Follow-up ultrasound showed no hydronephrosis.  He also has a history of a 24-hour urine collection from 2013 that showed elevated oxalate of 44, low urine volume of 1.7, elevated urinary sodium of 195, and high urinary citrate of 750.  He has also been followed since 2018 for a stable 1.4 cm Bosniak 25F renal cyst.  He denies any changes in his health since we last saw him.  Overall he is doing very well.  Denies any stone events or gross hematuria or flank pain.  He has mild urinary symptoms that are well controlled on nightly Flomax.  There are no aggravating factors.  Severity is moderate.  KUB today shows stable nephrolithiasis, significant left-sided stone burden.  MRI shows stable Bosniak 25F 1.4 cm right renal cyst.   ROS: Please see flowsheet from today's date for complete review of systems.  Physical Exam: BP 116/70 (BP Location: Right Arm, Patient Position: Sitting)   Pulse (!) 59   Ht 6\' 1"  (1.854 m)   Wt 202 lb (91.6 kg)   BMI 26.65 kg/m    Constitutional:  Alert and oriented, No acute distress. Respiratory: Normal respiratory effort, no increased work of breathing. GI: Abdomen is soft, nontender, nondistended, no abdominal masses GU: No CVA tenderness Skin: No rashes, bruises or suspicious lesions. Neurologic: Grossly intact, no focal deficits, moving all 4 extremities. Psychiatric: Normal mood and affect  Laboratory Data: Reviewed  Pertinent Imaging: Reviewed, see HPI  Assessment & Plan:   In summary, the patient is a  78 year old male with multiple urologic issues including recurrent nephrolithiasis, Bosniak 25F renal cyst on surveillance with yearly MRI, and BPH with mild urinary symptoms well managed on Flomax.  We discussed general stone prevention strategies including adequate hydration with goal of producing 2.5 L of urine daily, increasing citric acid intake, increasing calcium intake during high oxalate meals, minimizing animal protein, and decreasing salt intake. Information about dietary recommendations given today.   Regarding his renal cyst, standard surveillance would be yearly MRI for 5 years total from 2018.  We will plan for an MRI in 1 year in follow-up, and if stable consider repeating just one more MRI 2 years after that.  Regarding BPH, continue Flomax and behavioral strategies  -RTC in 1 year with KUB for nephrolithiasis surveillance, MRI for Bosniak 25F renal cyst surveillance, IPSS/PVR re-BPH  A total of 25 minutes were spent face-to-face with the patient, greater than 50% was spent in patient education, counseling, and coordination of care regarding nephrolithiasis, renal cysts surveillance, and BPH.   [name], [facility] [address], [place], [place] [address] [phone]

## 2018-08-15 LAB — HM DIABETES EYE EXAM

## 2018-08-25 DIAGNOSIS — H40003 Preglaucoma, unspecified, bilateral: Secondary | ICD-10-CM | POA: Diagnosis not present

## 2018-08-28 ENCOUNTER — Encounter: Payer: Self-pay | Admitting: Family

## 2018-08-28 NOTE — Progress Notes (Signed)
Abstracted eye exam 

## 2018-09-08 ENCOUNTER — Other Ambulatory Visit: Payer: Self-pay

## 2018-09-08 ENCOUNTER — Ambulatory Visit (INDEPENDENT_AMBULATORY_CARE_PROVIDER_SITE_OTHER): Payer: Medicare Other | Admitting: Family Medicine

## 2018-09-08 ENCOUNTER — Encounter: Payer: Self-pay | Admitting: Family Medicine

## 2018-09-08 VITALS — BP 112/72 | HR 52 | Temp 97.8°F | Ht 73.0 in | Wt 209.4 lb

## 2018-09-08 DIAGNOSIS — M25531 Pain in right wrist: Secondary | ICD-10-CM

## 2018-09-08 DIAGNOSIS — G5601 Carpal tunnel syndrome, right upper limb: Secondary | ICD-10-CM | POA: Diagnosis not present

## 2018-09-08 MED ORDER — KETOROLAC TROMETHAMINE 60 MG/2ML IM SOLN
60.0000 mg | Freq: Once | INTRAMUSCULAR | Status: AC
  Administered 2018-09-08: 60 mg via INTRAMUSCULAR

## 2018-09-08 MED ORDER — METHYLPREDNISOLONE 4 MG PO TBPK: ORAL_TABLET | ORAL | 0 refills | Status: DC

## 2018-09-08 NOTE — Progress Notes (Signed)
Subjective:    Patient ID: [name] [name], male    DOB: 05-21-1940, 78 y.o.   MRN: [id number]  HPI  Patient presents to clinic c/o pain in right hand & wrist for 1 day.  He had had issues with right shoulder and RUE off and on for years. Recently did PT for RUE, stopped 2 weeks ago.  Last night, right wrist began hurting and this AM hand also, found it difficult to hold coffee cup or make full fist due to pain.  No numbness or tingling in RUE. Reports hx of rotator cuff injury in right shoulder. Also states at times right thumb, index and middle finger will hurt/ache and radiate up into right wrist.   No fever or chills. No new or recent injury to right hand/wrist.  Patient Active Problem List   Diagnosis Date Noted  . Right shoulder pain 07/03/2018  . Skin lesion 06/12/2018  . Pruritus 06/12/2018  . Kidney stones, calcium oxalate 05/28/2018  . Atherosclerosis of native coronary artery with stable angina pectoris ([place]) 05/17/2018  . GERD (gastroesophageal reflux disease) 02/23/2018  . SCC (squamous cell carcinoma) 05/15/2017  . Cervical stenosis of spine 05/15/2017  . Tobacco abuse 01/26/2017  . Cardiac murmur 01/08/2017  . Right renal mass 10/31/2016  . Abdominal pain 10/31/2016  . Fatty liver 10/31/2016  . Hematuria 10/31/2016  . Acute non-recurrent maxillary sinusitis 10/17/2015  . Carotid artery disease ([place]) 10/03/2015  . Atherosclerosis of abdominal aorta ([place]) 07/28/2015  . CAD (coronary artery disease) 07/10/2015  . Essential hypertension 07/10/2015  . Mixed hyperlipidemia 07/10/2015  . Controlled type 2 diabetes mellitus with complication, without long-term current use of insulin ([place]) 07/10/2015  . History of melanoma 07/10/2015  . Chronic low back pain 07/10/2015  . BPH (benign prostatic hyperplasia) 07/10/2015  . Bilateral hand numbness 07/10/2015  . Insomnia 07/10/2015   Social History   Tobacco Use  . Smoking status: Current Some Day Smoker    Types:  Pipe  . Smokeless tobacco: Former Systems developer    Quit date: 10/17/2015  Substance Use Topics  . Alcohol use: Yes    Alcohol/week: 0.0 - 1.0 standard drinks    Comment: none last 24hrs    Review of Systems   Constitutional: Negative for chills, fatigue and fever.  HENT: Negative for congestion, ear pain, sinus pain and sore throat.   Eyes: Negative.   Respiratory: Negative for cough, shortness of breath and wheezing.   Cardiovascular: Negative for chest pain, palpitations and leg swelling.  Gastrointestinal: Negative for abdominal pain, diarrhea, nausea and vomiting.  Genitourinary: Negative for dysuria, frequency and urgency.  Musculoskeletal: +hand/wrist pain; right  Skin: Negative for color change, pallor and rash.  Neurological: Negative for syncope, light-headedness and headaches.  Psychiatric/Behavioral: The patient is not nervous/anxious.       Objective:   Physical Exam Vitals signs and nursing note reviewed.  Constitutional:      General: He is not in acute distress.    Appearance: He is not toxic-appearing.  HENT:     Head: Normocephalic and atraumatic.  Cardiovascular:     Heart sounds: Normal heart sounds.     Comments: Radial and ulnar pulses normal.  Pulmonary:     Effort: Pulmonary effort is normal.     Breath sounds: Normal breath sounds.  Musculoskeletal:     Comments: Knuckles on bilateral hands do appear larger, consistent with osteoarthritis. Able to stretch all fingers out on right and and make fist with right hand but  does this motion slowly due to pain. Able to move wrist up and down, but side to side motion painful Able to pronate and supinate right forearm Able to bend right elbow Able to hold right and left arm out in front of him, no drift, can resist me pushing down.   Skin:    General: Skin is warm and dry.     Coloration: Skin is not jaundiced or pale.     Findings: No bruising or erythema.  Neurological:     Mental Status: He is alert and  oriented to person, place, and time.     Gait: Gait normal.  Psychiatric:        Mood and Affect: Mood normal.        Behavior: Behavior normal.    Today's Vitals   09/08/18 1106  BP: 112/72  Pulse: (!) 52  Temp: 97.8 F (36.6 C)  TempSrc: Temporal  SpO2: 95%  Weight: 209 lb 6.4 oz (95 kg)  Height: 6\' 1"  (1.854 m)   Body mass index is 27.63 kg/m.     Assessment & Plan:    Right wrist pain/carpal tunnel - suspect patient's pain could be related to either arthritis, carpal tunnel or combination of both.  He will wear wrist brace to help support joint, already has wrist brace at home.  We will do IM Toradol x1 in clinic and he will take oral steroid taper.  We will set him up for nerve conduction study.  Also discussed doing gentle stretching and range of motion exercises to help keep joints, tendons and muscles are becoming too stiff.  Discussed using a stress ball to help improve grip.   Administrations This Visit    ketorolac (TORADOL) injection 60 mg    Admin Date 09/08/2018 Action Given Dose 60 mg Route Intramuscular Administered By Dorna Leitz, CMA         Patient aware someone will contact him in regards to nerve conduction study.  Also discussed possibility of seeing orthopedic or hand/wrist specialist for further evaluation of his symptoms.  He will keep regularly scheduled follow-up with PCP as planned and return to clinic sooner if any issues arise.

## 2018-09-08 NOTE — Patient Instructions (Signed)
Take steroid taper down  Wear wrist brace for joint support  Referral for nerve conduction study placed, they will contact you  Do gentle range of motion and stretching with arm, wrist, fingers  Suspect arthritis is also playing into pain in wrist. Topical rub like a bengay or biofreeze can be helpful

## 2018-09-14 ENCOUNTER — Telehealth: Payer: Self-pay | Admitting: Family

## 2018-09-14 NOTE — Telephone Encounter (Signed)
Medication: glipiZIDE (GLUCOTROL) XL 5 MG tablet [id number] Take by mouth 2 (two) times a day. Take one pill daily before breakfast and one pill in the evening before dinner.  Has the patient contacted their pharmacy? Yes  (Agent: If no, request that the patient contact the pharmacy for the refill.) (Agent: If yes, when and what did the pharmacy advise?)  Preferred Pharmacy (with phone number or street name): [facility] [id number], [place] [phone] (Phone) [phone] (Fax)    Agent: Please be advised that RX refills may take up to 3 business days. We ask that you follow-up with your pharmacy.

## 2018-09-15 NOTE — Telephone Encounter (Signed)
This Rx was refilled for 90 tablets on 08/10/18.

## 2018-09-16 ENCOUNTER — Other Ambulatory Visit: Payer: Self-pay

## 2018-09-16 MED ORDER — GLIPIZIDE 5 MG PO TABS: 5.0000 mg | ORAL_TABLET | Freq: Two times a day (BID) | ORAL | 5 refills | Status: DC

## 2018-09-16 NOTE — Telephone Encounter (Signed)
If he is on glipizide ER,  Then the rx would be glipizide 10mg  PO QD. His current rx is wrong for ER dose.   If non- ER, then divide doses and give 30 minutes prior to meal, so:  glipizide 5 mg PO BID 15 prior to  Meal.   Call patient to make aware

## 2018-09-16 NOTE — Telephone Encounter (Signed)
I spoke with patient's wife to clarify. Patient prefers taking twice a day, so I have sent in plain Glipizide PO BID.

## 2018-09-28 ENCOUNTER — Other Ambulatory Visit (INDEPENDENT_AMBULATORY_CARE_PROVIDER_SITE_OTHER): Payer: Medicare Other

## 2018-09-28 ENCOUNTER — Other Ambulatory Visit: Payer: Self-pay

## 2018-09-28 ENCOUNTER — Telehealth: Payer: Self-pay

## 2018-09-28 DIAGNOSIS — E118 Type 2 diabetes mellitus with unspecified complications: Secondary | ICD-10-CM | POA: Diagnosis not present

## 2018-09-28 NOTE — Telephone Encounter (Signed)
Will order MRI Refer to PM&R or NS appt 10/01/2018  [place]

## 2018-09-28 NOTE — Telephone Encounter (Signed)
Called and spoke to patient.  Patient said he needs to schedule lab appt to check A1C.  (Future lab orders are in chart).    Patient c/o of having lower back pain for the last 1 1/2 week.  Patient has been taking tylenol and aleve but has had no relief.   Patient feels he has arthritis and bony growth that may be pressing on a nerve.    Patient rates his pain when still 2 or 3; when moving around 5 or 6; and when going up or down steps pain is an 8 or 9 on pain scale.  Patient stated that he is also having leg pain.  Patient said that he is having difficulty walking.  Advised patient to go to urgent care if symptoms worsen.  Patient said that he has waited a 1 1/2 week and is trying to avoid going into urgent care.  Patient scheduled a lab appt for today at 3:45 pm and an in-person office visit w/ Dr. Aundra Dubin on 10/01/18 @ 2:30 pm.    Patient screened for COVID, no symptoms, no exposure.

## 2018-09-28 NOTE — Telephone Encounter (Signed)
Copied from [place] [phone]. Topic: Appointment Scheduling - Scheduling Inquiry for Clinic >> Sep 28, 2018 11:50 AM Erick Blinks wrote: Reason for CRM: Pt requesting soonest available appt for back pain and A1C labs, tried calling office. Please advise, pt of [name] [name]. Best contact: [phone]

## 2018-09-29 LAB — HEMOGLOBIN A1C: Hgb A1c MFr Bld: 8.2 % — ABNORMAL HIGH (ref 4.6–6.5)

## 2018-10-01 ENCOUNTER — Other Ambulatory Visit: Payer: Self-pay

## 2018-10-01 ENCOUNTER — Ambulatory Visit (INDEPENDENT_AMBULATORY_CARE_PROVIDER_SITE_OTHER): Payer: Medicare Other | Admitting: Internal Medicine

## 2018-10-01 ENCOUNTER — Encounter: Payer: Self-pay | Admitting: Internal Medicine

## 2018-10-01 ENCOUNTER — Ambulatory Visit (INDEPENDENT_AMBULATORY_CARE_PROVIDER_SITE_OTHER): Payer: Medicare Other

## 2018-10-01 VITALS — BP 140/70 | HR 56 | Temp 87.0°F | Ht 71.85 in | Wt 213.2 lb

## 2018-10-01 DIAGNOSIS — E1165 Type 2 diabetes mellitus with hyperglycemia: Secondary | ICD-10-CM

## 2018-10-01 DIAGNOSIS — R062 Wheezing: Secondary | ICD-10-CM

## 2018-10-01 DIAGNOSIS — M479 Spondylosis, unspecified: Secondary | ICD-10-CM

## 2018-10-01 DIAGNOSIS — M545 Low back pain, unspecified: Secondary | ICD-10-CM

## 2018-10-01 DIAGNOSIS — M542 Cervicalgia: Secondary | ICD-10-CM | POA: Diagnosis not present

## 2018-10-01 DIAGNOSIS — M5136 Other intervertebral disc degeneration, lumbar region: Secondary | ICD-10-CM

## 2018-10-01 DIAGNOSIS — M51369 Other intervertebral disc degeneration, lumbar region without mention of lumbar back pain or lower extremity pain: Secondary | ICD-10-CM

## 2018-10-01 DIAGNOSIS — R05 Cough: Secondary | ICD-10-CM | POA: Diagnosis not present

## 2018-10-01 MED ORDER — GLIPIZIDE ER 5 MG PO TB24: 5.0000 mg | ORAL_TABLET | Freq: Two times a day (BID) | ORAL | 3 refills | Status: DC

## 2018-10-01 MED ORDER — CONTOUR TEST VI STRP: 1.0000 | ORAL_STRIP | Freq: Two times a day (BID) | 3 refills | Status: DC

## 2018-10-01 MED ORDER — LANCETS MISC: 1.0000 | Freq: Two times a day (BID) | 3 refills | Status: DC

## 2018-10-01 NOTE — Progress Notes (Signed)
No chief complaint on file.  F/u  1. Chronic neck pain and back pain worse x 2 weeks tried heat support belt naproxen 12 hr and Tylenol going down steps makes back pain worse and 7-8/10 Neck pain and had ROM issues until did PT at [facility] in the past which helped. He did not try medrol dose pak. He wants to retry PT again before seeing a specialist for trying any other medication he already takes tramadol 50 mg bid   2. C/o right wrist pain and trouble closing fist and rotation tried heat, ice, exercise and MSG oint x 3 days which helped and now not painful   3. DM 2 since glipizide changed from ER to IR sugar in the am is 160s and reviewed A1C he needs more lancets and strips. He does report wt gain from 190s to now 213 due to diet noncompliance at times     Review of Systems  Constitutional: Negative for weight loss.  Respiratory: Negative for shortness of breath.   Cardiovascular: Negative for chest pain.  Musculoskeletal: Positive for back pain, falls, joint pain and neck pain.  Endo/Heme/Allergies:       +elevated am cbg    Past Medical History:  Diagnosis Date  . Aortic insufficiency    a. noted on TTE 2015  . Arthritis   . CAD (coronary artery disease)    a. remote PCI in 1991 and 2005; b. MV 3/15: old inferior MI, no ischemia, LVEF 50%, slight inferior wall hypokniesis  . Chicken pox   . Colon polyps    4 pre-cancerous   . Diverticulitis   . DM type 2 (diabetes mellitus, type 2) ([place])   . Family history of adverse reaction to anesthesia   . GERD (gastroesophageal reflux disease)   . Heart murmur   . History of kidney stones   . Hyperlipidemia   . Hypertension   . Kidney stones   . Melanoma ([place]) 1980   Resected from his back  . Mitral regurgitation    a. noted on TTE 2015  . Myocardial infarction ([place])   . Systolic dysfunction    a. TTE 2015: EF  50-55%, mild global hypokinesis, mild to moderate aortic sclerosis, mild aortic insufficiency, mild to moderate  mitral regurgitation, mild tricuspid regurgitation, moderately dilated left atrium, mildly dilated right ventricle, PASP 31 mmHg   Past Surgical History:  Procedure Laterality Date  . CHOLECYSTECTOMY  2012  . COLONOSCOPY    . CORONARY ANGIOPLASTY WITH STENT PLACEMENT  1991 & 2005  . CYSTOSCOPY/URETEROSCOPY/HOLMIUM LASER/STENT PLACEMENT Right 01/02/2018   Procedure: CYSTOSCOPY/URETEROSCOPY/HOLMIUM LASER/STENT PLACEMENT;  Surgeon: [name], MD;  Location: [facility];  Service: Urology;  Laterality: Right;  . ESOPHAGOGASTRODUODENOSCOPY (EGD) WITH PROPOFOL N/A 11/19/2016   Procedure: ESOPHAGOGASTRODUODENOSCOPY (EGD) WITH PROPOFOL;  Surgeon: [name], MD;  Location: [facility];  Service: Endoscopy;  Laterality: N/A;  . LITHOTRIPSY  2015  . MELANOMA EXCISION  1980   malignant  . NERVE SURGERY  2015   ulna nerve  . TONSILLECTOMY  1945  . WISDOM TOOTH EXTRACTION     Family History  Problem Relation Age of Onset  . Hyperlipidemia Mother   . Hypertension Mother   . Heart disease Mother   . Diabetes Mother   . Colon cancer Father   . Lung cancer Father   . Kidney cancer Father        malignant capsulated kidney tumor  . Diabetes Father   . Bladder Cancer Neg Hx   .  Prostate cancer Neg Hx    Social History   Socioeconomic History  . Marital status: Married    Spouse name: Not on file  . Number of children: Not on file  . Years of education: Not on file  . Highest education level: Not on file  Occupational History  . Not on file  Social Needs  . Financial resource strain: Not hard at all  . Food insecurity    Worry: Never true    Inability: Never true  . Transportation needs    Medical: No    Non-medical: No  Tobacco Use  . Smoking status: Current Some Day Smoker    Types: Pipe  . Smokeless tobacco: Former Systems developer    Quit date: 10/17/2015  Substance and Sexual Activity  . Alcohol use: Yes    Alcohol/week: 0.0 - 1.0 standard drinks    Comment: none last 24hrs  .  Drug use: No  . Sexual activity: Not on file  Lifestyle  . Physical activity    Days per week: 2 days    Minutes per session: 60 min  . Stress: Not at all  Relationships  . Social Herbalist on phone: Not on file    Gets together: Not on file    Attends religious service: Not on file    Active member of club or organization: Not on file    Attends meetings of clubs or organizations: Not on file    Relationship status: Not on file  . Intimate partner violence    Fear of current or ex partner: No    Emotionally abused: No    Physically abused: No    Forced sexual activity: No  Other Topics Concern  . Not on file  Social History Narrative   Married    No outpatient medications have been marked as taking for the 10/01/18 encounter (Office Visit) with McLean-Scocuzza, Nino Glow, MD.   Allergies  Allergen Reactions  . Metformin And Related Diarrhea    Diarrhea, leg cramps  . Azithromycin Other (See Comments)    Not recommended - no reaction   Recent Results (from the past 2160 hour(s))  I-STAT creatinine     Status: None   Collection Time: 08/06/18  8:15 AM  Result Value Ref Range   Creatinine, Ser 0.90 0.61 - 1.24 mg/dL  HM DIABETES EYE EXAM     Status: None   Collection Time: 08/15/18 12:00 AM  Result Value Ref Range   HM Diabetic Eye Exam No Retinopathy No Retinopathy  Hemoglobin A1c     Status: Abnormal   Collection Time: 09/28/18  3:38 PM  Result Value Ref Range   Hgb A1c MFr Bld 8.2 (H) 4.6 - 6.5 %    Comment: Glycemic Control Guidelines for People with Diabetes:Non Diabetic:  <6%Goal of Therapy: <7%Additional Action Suggested:  >8%    Objective  Body mass index is 29.04 kg/m. Wt Readings from Last 3 Encounters:  10/01/18 213 lb 3.2 oz (96.7 kg)  09/08/18 209 lb 6.4 oz (95 kg)  08/13/18 202 lb (91.6 kg)   Temp Readings from Last 3 Encounters:  10/01/18 (!) 87 F (30.6 C) (Oral)  09/08/18 97.8 F (36.6 C) (Temporal)  05/28/18 (!) 96.9 F (36.1 C)    BP Readings from Last 3 Encounters:  10/01/18 140/70  09/08/18 112/72  08/13/18 116/70   Pulse Readings from Last 3 Encounters:  10/01/18 (!) 56  09/08/18 (!) 52  08/13/18 (!) 59  Physical Exam Vitals signs and nursing note reviewed.  Constitutional:      Appearance: Normal appearance. He is well-developed and well-groomed.  HENT:     Head: Normocephalic and atraumatic.     Comments: +mask on   Cardiovascular:     Rate and Rhythm: Normal rate and regular rhythm.     Heart sounds: Normal heart sounds. No murmur.  Pulmonary:     Effort: Pulmonary effort is normal.     Breath sounds: Wheezing present.     Comments: Right lung field wheezing on exam he does smoke a pipe long term  Musculoskeletal:     Cervical back: He exhibits tenderness. He exhibits normal range of motion.     Lumbar back: He exhibits tenderness.       Back:  Skin:    General: Skin is warm and dry.  Neurological:     General: No focal deficit present.     Mental Status: He is alert and oriented to person, place, and time. Mental status is at baseline.     Gait: Gait abnormal.     Comments: Slowed gait 2/2 back pain    Psychiatric:        Attention and Perception: Attention and perception normal.        Mood and Affect: Mood and affect normal.        Speech: Speech normal.        Behavior: Behavior normal. Behavior is cooperative.        Thought Content: Thought content normal.        Cognition and Memory: Cognition and memory normal.        Judgment: Judgment normal.     Assessment  Plan  Cervicalgia - Plan: Ambulatory referral to Physical Therapy [facility]   DDD (degenerative disc disease), lumbar - Plan: Ambulatory referral to Physical Therapy  Spondylosis - Plan: Ambulatory referral to Physical Therapy  Bilateral low back pain, unspecified chronicity,  CT ab/pelvis 09/24/16 :  Grade 1 spondylolisthesis of L4 on L5 related to severe facet degenerative changes. Degenerative disc disease  at L4-5 and L5-S1. No acute abnormality.   - Plan: Ambulatory referral to Physical Therapy -continue tramadol 50 mg bid prn with prn Tylenol  -consider going up on tramadol to tid in future and referral to PM&R   Type 2 diabetes mellitus with hyperglycemia (A1C 8.2 09/28/2018), without long-term current use of insulin ([facility]CC) - Plan: glucose blood (CONTOUR TEST) test strip, Lancets MISC, glipiZIDE (GLUCOTROL XL) 5 MG 24 hr tablet bid d/c IR formulation 5 mg bid. Pt thinks ER works better for his cbgs -disc healthy and exercise  -disc Trulicity vs Ozempic in the future if A1C not improving  -flu shot given today high dose   Wheezing r/o copd chronic pipe smoking - Plan: DG Chest 2 View normal 10/01/2018  -he may need CT chest in future with h/o long term pipe smoking can f/u with PCP   Provider: Dr. Olivia Mackie McLean-Scocuzza-Internal Medicine

## 2018-10-01 NOTE — Telephone Encounter (Signed)
This is a patient of Margaret's.  I was going through her in box and reviewed this phone message and his recent lab - a1c.  I have sent a message to [facility] to call pt about a1c. It appears that you spoke to him about ozempic.  I was going to get him schedule with Catie to follow up on his diabetes and discuss treatment, etc.  Let me know if you had instructed him differently.

## 2018-10-01 NOTE — Patient Instructions (Addendum)
Maximum dose Tylenol 3000 mg total/24 hours  Ok to take with tramadol   Dr. Sharlet Salina he is the back specialist that does injections of steroid call back if needed   Results for [name] [name], [name] "BEN" (MRN [id number]) as of 10/01/2018 15:25  Ref. Range 06/10/2017 08:49 12/22/2017 18:10 03/02/2018 08:22 06/03/2018 09:10 09/28/2018 15:38  Hemoglobin A1C Latest Ref Range: 4.6 - 6.5 % 6.9 (H)  7.4 (H) 7.8 (H) 8.2 (H)   Mucinex DM green label for cough and phelgm  Consider Trulicity or ozempic 1x per week for better sugar control   Ozempic Semaglutide injection solution What is this medicine? SEMAGLUTIDE (Sem a GLOO tide) is used to improve blood sugar control in adults with type 2 diabetes. This medicine may be used with other diabetes medicines. This drug may also reduce the risk of heart attack or stroke if you have type 2 diabetes and risk factors for heart disease. This medicine may be used for other purposes; ask your health care provider or pharmacist if you have questions. COMMON BRAND NAME(S): OZEMPIC What should I tell my health care provider before I take this medicine? They need to know if you have any of these conditions:  endocrine tumors (MEN 2) or if someone in your family had these tumors  eye disease, vision problems  history of pancreatitis  kidney disease  stomach problems  thyroid cancer or if someone in your family had thyroid cancer  an unusual or allergic reaction to semaglutide, other medicines, foods, dyes, or preservatives  pregnant or trying to get pregnant  breast-feeding How should I use this medicine? This medicine is for injection under the skin of your upper leg (thigh), stomach area, or upper arm. It is given once every week (every 7 days). You will be taught how to prepare and give this medicine. Use exactly as directed. Take your medicine at regular intervals. Do not take it more often than directed. If you use this medicine with insulin, you should  inject this medicine and the insulin separately. Do not mix them together. Do not give the injections right next to each other. Change (rotate) injection sites with each injection. It is important that you put your used needles and syringes in a special sharps container. Do not put them in a trash can. If you do not have a sharps container, call your pharmacist or healthcare provider to get one. A special MedGuide will be given to you by the pharmacist with each prescription and refill. Be sure to read this information carefully each time. Talk to your pediatrician regarding the use of this medicine in children. Special care may be needed. Overdosage: If you think you have taken too much of this medicine contact a poison control center or emergency room at once. NOTE: This medicine is only for you. Do not share this medicine with others. What if I miss a dose? If you miss a dose, take it as soon as you can within 5 days after the missed dose. Then take your next dose at your regular weekly time. If it has been longer than 5 days after the missed dose, do not take the missed dose. Take the next dose at your regular time. Do not take double or extra doses. If you have questions about a missed dose, contact your health care provider for advice. What may interact with this medicine?  other medicines for diabetes Many medications may cause changes in blood sugar, these include:  alcohol containing beverages  antiviral  medicines for HIV or AIDS  aspirin and aspirin-like drugs  certain medicines for blood pressure, heart disease, irregular heart beat  chromium  diuretics  male hormones, such as estrogens or progestins, birth control pills  fenofibrate  gemfibrozil  isoniazid  lanreotide  male hormones or anabolic steroids  MAOIs like Carbex, Eldepryl, Marplan, Nardil, and Parnate  medicines for weight loss  medicines for allergies, asthma, cold, or cough  medicines for  depression, anxiety, or psychotic disturbances  niacin  nicotine  NSAIDs, medicines for pain and inflammation, like ibuprofen or naproxen  octreotide  pasireotide  pentamidine  phenytoin  probenecid  quinolone antibiotics such as ciprofloxacin, levofloxacin, ofloxacin  some herbal dietary supplements  steroid medicines such as prednisone or cortisone  sulfamethoxazole; trimethoprim  thyroid hormones Some medications can hide the warning symptoms of low blood sugar (hypoglycemia). You may need to monitor your blood sugar more closely if you are taking one of these medications. These include:  beta-blockers, often used for high blood pressure or heart problems (examples include atenolol, metoprolol, propranolol)  clonidine  guanethidine  reserpine This list may not describe all possible interactions. Give your health care provider a list of all the medicines, herbs, non-prescription drugs, or dietary supplements you use. Also tell them if you smoke, drink alcohol, or use illegal drugs. Some items may interact with your medicine. What should I watch for while using this medicine? Visit your doctor or health care professional for regular checks on your progress. Drink plenty of fluids while taking this medicine. Check with your doctor or health care professional if you get an attack of severe diarrhea, nausea, and vomiting. The loss of too much body fluid can make it dangerous for you to take this medicine. A test called the HbA1C (A1C) will be monitored. This is a simple blood test. It measures your blood sugar control over the last 2 to 3 months. You will receive this test every 3 to 6 months. Learn how to check your blood sugar. Learn the symptoms of low and high blood sugar and how to manage them. Always carry a quick-source of sugar with you in case you have symptoms of low blood sugar. Examples include hard sugar candy or glucose tablets. Make sure others know that you can  choke if you eat or drink when you develop serious symptoms of low blood sugar, such as seizures or unconsciousness. They must get medical help at once. Tell your doctor or health care professional if you have high blood sugar. You might need to change the dose of your medicine. If you are sick or exercising more than usual, you might need to change the dose of your medicine. Do not skip meals. Ask your doctor or health care professional if you should avoid alcohol. Many nonprescription cough and cold products contain sugar or alcohol. These can affect blood sugar. Pens should never be shared. Even if the needle is changed, sharing may result in passing of viruses like hepatitis or HIV. Wear a medical ID bracelet or chain, and carry a card that describes your disease and details of your medicine and dosage times. Do not become pregnant while taking this medicine. Women should inform their doctor if they wish to become pregnant or think they might be pregnant. There is a potential for serious side effects to an unborn child. Talk to your health care professional or pharmacist for more information. What side effects may I notice from receiving this medicine? Side effects that you should report  to your doctor or health care professional as soon as possible:  allergic reactions like skin rash, itching or hives, swelling of the face, lips, or tongue  breathing problems  changes in vision  diarrhea that continues or is severe  lump or swelling on the neck  severe nausea  signs and symptoms of infection like fever or chills; cough; sore throat; pain or trouble passing urine  signs and symptoms of low blood sugar such as feeling anxious, confusion, dizziness, increased hunger, unusually weak or tired, sweating, shakiness, cold, irritable, headache, blurred vision, fast heartbeat, loss of consciousness  signs and symptoms of kidney injury like trouble passing urine or change in the amount of  urine  trouble swallowing  unusual stomach upset or pain  vomiting Side effects that usually do not require medical attention (report to your doctor or health care professional if they continue or are bothersome):  constipation  diarrhea  nausea  pain, redness, or irritation at site where injected  stomach upset This list may not describe all possible side effects. Call your doctor for medical advice about side effects. You may report side effects to FDA at 1-800-FDA-1088. Where should I keep my medicine? Keep out of the reach of children. Store unopened pens in a refrigerator between 2 and 8 degrees C (36 and 46 degrees F). Do not freeze. Protect from light and heat. After you first use the pen, it can be stored for 56 days at room temperature between 15 and 30 degrees C (59 and 86 degrees F) or in a refrigerator. Throw away your used pen after 56 days or after the expiration date, whichever comes first. Do not store your pen with the needle attached. If the needle is left on, medicine may leak from the pen. NOTE: This sheet is a summary. It may not cover all possible information. If you have questions about this medicine, talk to your doctor, pharmacist, or health care provider.  2020 Elsevier/Gold Standard ([id number])  Trulicity  Dulaglutide injection What is this medicine? DULAGLUTIDE (DOO la GLOO tide) is used to improve blood sugar control in adults with type 2 diabetes. This medicine may be used with other oral diabetes medicines. This drug may also reduce the risk of heart attack or stroke if you have type 2 diabetes and risk factors for heart disease. This medicine may be used for other purposes; ask your health care provider or pharmacist if you have questions. COMMON BRAND NAME(S): TRULICITY What should I tell my health care provider before I take this medicine? They need to know if you have any of these conditions:  endocrine tumors (MEN 2) or if someone in  your family had these tumors  eye disease, vision problems  history of pancreatitis  kidney disease  liver disease  stomach problems  thyroid cancer or if someone in your family had thyroid cancer  an unusual or allergic reaction to dulaglutide, other medicines, foods, dyes, or preservatives  pregnant or trying to get pregnant  breast-feeding How should I use this medicine? This medicine is for injection under the skin of your upper leg (thigh), stomach area, or upper arm. It is usually given once every week (every 7 days). You will be taught how to prepare and give this medicine. Use exactly as directed. Take your medicine at regular intervals. Do not take it more often than directed. If you use this medicine with insulin, you should inject this medicine and the insulin separately. Do not mix them together.  Do not give the injections right next to each other. Change (rotate) injection sites with each injection. It is important that you put your used needles and syringes in a special sharps container. Do not put them in a trash can. If you do not have a sharps container, call your pharmacist or healthcare provider to get one. A special MedGuide will be given to you by the pharmacist with each prescription and refill. Be sure to read this information carefully each time. Talk to your pediatrician regarding the use of this medicine in children. Special care may be needed. Overdosage: If you think you have taken too much of this medicine contact a poison control center or emergency room at once. NOTE: This medicine is only for you. Do not share this medicine with others. What if I miss a dose? If you miss a dose, take it as soon as you can within 3 days after the missed dose. Then take your next dose at your regular weekly time. If it has been longer than 3 days after the missed dose, do not take the missed dose. Take the next dose at your regular time. Do not take double or extra doses. If  you have questions about a missed dose, contact your health care provider for advice. What may interact with this medicine?  other medicines for diabetes Many medications may cause changes in blood sugar, these include:  alcohol containing beverages  antiviral medicines for HIV or AIDS  aspirin and aspirin-like drugs  certain medicines for blood pressure, heart disease, irregular heart beat  chromium  diuretics  male hormones, such as estrogens or progestins, birth control pills  fenofibrate  gemfibrozil  isoniazid  lanreotide  male hormones or anabolic steroids  MAOIs like Carbex, Eldepryl, Marplan, Nardil, and Parnate  medicines for weight loss  medicines for allergies, asthma, cold, or cough  medicines for depression, anxiety, or psychotic disturbances  niacin  nicotine  NSAIDs, medicines for pain and inflammation, like ibuprofen or naproxen  octreotide  pasireotide  pentamidine  phenytoin  probenecid  quinolone antibiotics such as ciprofloxacin, levofloxacin, ofloxacin  some herbal dietary supplements  steroid medicines such as prednisone or cortisone  sulfamethoxazole; trimethoprim  thyroid hormones Some medications can hide the warning symptoms of low blood sugar (hypoglycemia). You may need to monitor your blood sugar more closely if you are taking one of these medications. These include:  beta-blockers, often used for high blood pressure or heart problems (examples include atenolol, metoprolol, propranolol)  clonidine  guanethidine  reserpine This list may not describe all possible interactions. Give your health care provider a list of all the medicines, herbs, non-prescription drugs, or dietary supplements you use. Also tell them if you smoke, drink alcohol, or use illegal drugs. Some items may interact with your medicine. What should I watch for while using this medicine? Visit your doctor or health care professional for regular  checks on your progress. Drink plenty of fluids while taking this medicine. Check with your doctor or health care professional if you get an attack of severe diarrhea, nausea, and vomiting. The loss of too much body fluid can make it dangerous for you to take this medicine. A test called the HbA1C (A1C) will be monitored. This is a simple blood test. It measures your blood sugar control over the last 2 to 3 months. You will receive this test every 3 to 6 months. Learn how to check your blood sugar. Learn the symptoms of low and high blood sugar  and how to manage them. Always carry a quick-source of sugar with you in case you have symptoms of low blood sugar. Examples include hard sugar candy or glucose tablets. Make sure others know that you can choke if you eat or drink when you develop serious symptoms of low blood sugar, such as seizures or unconsciousness. They must get medical help at once. Tell your doctor or health care professional if you have high blood sugar. You might need to change the dose of your medicine. If you are sick or exercising more than usual, you might need to change the dose of your medicine. Do not skip meals. Ask your doctor or health care professional if you should avoid alcohol. Many nonprescription cough and cold products contain sugar or alcohol. These can affect blood sugar. Pens should never be shared. Even if the needle is changed, sharing may result in passing of viruses like hepatitis or HIV. Wear a medical ID bracelet or chain, and carry a card that describes your disease and details of your medicine and dosage times. What side effects may I notice from receiving this medicine? Side effects that you should report to your doctor or health care professional as soon as possible:  allergic reactions like skin rash, itching or hives, swelling of the face, lips, or tongue  breathing problems  changes in vision  diarrhea that continues or is severe  lump or swelling  on the neck  severe nausea  signs and symptoms of infection like fever or chills; cough; sore throat; pain or trouble passing urine  signs and symptoms of low blood sugar such as feeling anxious, confusion, dizziness, increased hunger, unusually weak or tired, sweating, shakiness, cold, irritable, headache, blurred vision, fast heartbeat, loss of consciousness  signs and symptoms of kidney injury like trouble passing urine or change in the amount of urine  trouble swallowing  unusual stomach upset or pain  vomiting Side effects that usually do not require medical attention (report to your doctor or health care professional if they continue or are bothersome):  diarrhea  loss of appetite  nausea  pain, redness, or irritation at site where injected  stomach upset This list may not describe all possible side effects. Call your doctor for medical advice about side effects. You may report side effects to FDA at 1-800-FDA-1088. Where should I keep my medicine? Keep out of the reach of children. Store unopened pens in a refrigerator between 2 and 8 degrees C (36 and 46 degrees F). Do not freeze or use if the medicine has been frozen. Protect from light and excessive heat. Store in the carton until use. Each single-dose pen can be kept at room temperature, not to exceed 30 degrees C (86 degrees F) for a total of 14 days, if needed. Throw away any unused medicine after the expiration date on the label. NOTE: This sheet is a summary. It may not cover all possible information. If you have questions about this medicine, talk to your doctor, pharmacist, or health care provider.  2020 Elsevier/Gold Standard (2018-03-09 15:36:44)   Degenerative Disk Disease  Degenerative disk disease is a condition caused by changes that occur in the spinal disks as a person ages. Spinal disks are soft and compressible disks located between the bones of your spine (vertebrae). These disks act like shock  absorbers. Degenerative disk disease can affect the whole spine. However, the neck and lower back are most often affected. Many changes can occur in the spinal disks with aging,  such as:  The spinal disks may dry and shrink.  Small tears may occur in the tough, outer covering of the disk (annulus).  The disk space may become smaller due to loss of water.  Abnormal growths in the bone (spurs) may occur. This can put pressure on the nerve roots exiting the spinal canal, causing pain.  The spinal canal may become narrowed. What are the causes? This condition may be caused by:  Normal degeneration with age.  Injuries.  Certain activities and sports that cause damage. What increases the risk? The following factors may make you more likely to develop this condition:  Being overweight.  Having a family history of degenerative disk disease.  Smoking.  Sudden injury.  Doing work that requires heavy lifting. What are the signs or symptoms? Symptoms of this condition include:  Pain that varies in intensity. Some people have no pain, while others have severe pain. The location of the pain depends on the part of your backbone that is affected. You may have: ? Pain in your neck or arm if a disk in your neck area is affected. ? Pain in your back, buttocks, or legs if a disk in your lower back is affected.  Pain that becomes worse while bending or reaching up, or with twisting movements.  Pain that may start gradually and then get worse as time passes. It may also start after a major or minor injury.  Numbness or tingling in the arms or legs. How is this diagnosed? This condition may be diagnosed based on:  Your symptoms and medical history.  A physical exam.  Imaging tests, including: ? An X-ray of the spine. ? MRI. How is this treated? This condition may be treated with:  Medicines.  Rehabilitation exercises. These activities aim to strengthen muscles in your back and  abdomen to better support your spine. If treatments do not help to relieve your symptoms or you have severe pain, you may need surgery. Follow these instructions at home: Medicines  Take over-the-counter and prescription medicines only as told by your health care provider.  Do not drive or use heavy machinery while taking prescription pain medicine.  If you are taking prescription pain medicine, take actions to prevent or treat constipation. Your health care provider may recommend that you: ? Drink enough fluid to keep your urine pale yellow. ? Eat foods that are high in fiber, such as fresh fruits and vegetables, whole grains, and beans. ? Limit foods that are high in fat and processed sugars, such as fried or sweet foods. ? Take an over-the-counter or prescription medicine for constipation. Activity  Rest as told by your health care provider.  Ask your health care provider what activities are safe for you. Return to your normal activities as directed.  Avoid sitting for a long time without moving. Get up to take short walks every 1-2 hours. This is important to improve blood flow and breathing. Ask for help if you feel weak or unsteady.  Perform relaxation exercises as told by your health care provider.  Maintain good posture.  Do not lift anything that is heavier than 10 lb (4.5 kg), or the limit that you are told, until your health care provider says that it is safe.  Follow proper lifting and walking techniques as told by your health care provider. Managing pain, stiffness, and swelling   If directed, put ice on the painful area. Icing can help to relieve pain. ? Put ice in a plastic bag. ?  Place a towel between your skin and the bag. ? Leave the ice on for 20 minutes, 2-3 times a day.  If directed, apply heat to the painful area as often as told by your health care provider. Heat can reduce the stiffness of your muscles. Use the heat source that your health care provider  recommends, such as a moist heat pack or a heating pad. ? Place a towel between your skin and the heat source. ? Leave the heat on for 20-30 minutes. ? Remove the heat if your skin turns bright red. This is especially important if you are unable to feel pain, heat, or cold. You may have a greater risk of getting burned. General instructions  Change your sitting, standing, and sleeping habits as told by your health care provider.  Avoid sitting in the same position for long periods of time. Change positions frequently.  Lose weight or maintain a healthy weight as told by your health care provider.  Do not use any products that contain nicotine or tobacco, such as cigarettes and e-cigarettes. If you need help quitting, ask your health care provider.  Wear supportive footwear.  Keep all follow-up visits as told by your health care provider. This is important. This may include visits for physical therapy. Contact a health care provider if you:  Have pain that does not go away within 1-4 weeks.  Lose your appetite.  Lose weight without trying. Get help right away if you:  Have severe pain.  Notice weakness in your arms, hands, or legs.  Begin to lose control of your bladder or bowel movements.  Have fevers or night sweats. Summary  Degenerative disk disease is a condition caused by changes that occur in the spinal disks as a person ages.  Degenerative disk disease can affect the whole spine. However, the neck and lower back are most often affected.  Take over-the-counter and prescription medicines only as told by your health care provider. This information is not intended to replace advice given to you by your health care provider. Make sure you discuss any questions you have with your health care provider. Document Released: 10/28/2006 Document Revised: 12/26/2016 Document Reviewed: 12/26/2016 Elsevier Patient Education  [place].  Spinal Stenosis  Spinal stenosis  occurs when the open space (spinal canal) between the bones of your spine (vertebrae) narrows, putting pressure on the spinal cord or nerves. What are the causes? This condition is caused by areas of bone pushing into the central canals of your vertebrae. This condition may be present at birth (congenital), or it may be caused by:  Arthritic deterioration of your vertebrae (spinal degeneration). This usually starts around age 38.  Injury or trauma to the spine.  Tumors in the spine.  Calcium deposits in the spine. What are the signs or symptoms? Symptoms of this condition include:  Pain in the neck or back that is generally worse with activities, particularly when standing and walking.  Numbness, tingling, hot or cold sensations, weakness, or weariness in your legs.  Pain going up and down the leg (sciatica).  Frequent episodes of falling.  A foot-slapping gait that leads to muscle weakness. In more serious cases, you may develop:  Problemspassing stool or passing urine.  Difficulty having sex.  Loss of feeling in part or all of your leg. Symptoms may come on slowly and get worse over time. How is this diagnosed? This condition is diagnosed based on your medical history and a physical exam. Tests will also  be done, such as:  MRI.  CT scan.  X-ray. How is this treated? Treatment for this condition often focuses on managing your pain and any other symptoms. Treatment may include:  Practicing good posture to lessen pressure on your nerves.  Exercising to strengthen muscles, build endurance, improve balance, and maintain good joint movement (range of motion).  Losing weight, if needed.  Taking medicines to reduce swelling, inflammation, or pain.  Assistive devices, such as a corset or brace. In some cases, surgery may be needed. The most common procedure is decompression laminectomy. This is done to remove excess bone that puts pressure on your nerve roots. Follow  these instructions at home: Managing pain, stiffness, and swelling  Do all exercises and stretches as told by your health care provider.  Practice good posture. If you were given a brace or a corset, wear it as told by your health care provider.  Do not do any activities that cause pain. Ask your health care provider what activities are safe for you.  Do not lift anything that is heavier than 10 lb (4.5 kg) or the limit that your health care provider tells you.  Maintain a healthy weight. Talk with your health care provider if you need help losing weight.  If directed, apply heat to the affected area as often as told by your health care provider. Use the heat source that your health care provider recommends, such as a moist heat pack or a heating pad. ? Place a towel between your skin and the heat source. ? Leave the heat on for 20-30 minutes. ? Remove the heat if your skin turns bright red. This is especially important if you are not able to feel pain, heat, or cold. You may have a greater risk of getting burned. General instructions  Take over-the-counter and prescription medicines only as told by your health care provider.  Do not use any products that contain nicotine or tobacco, such as cigarettes and e-cigarettes. If you need help quitting, ask your health care provider.  Eat a healthy diet. This includes plenty of fruits and vegetables, whole grains, and low-fat (lean) protein.  Keep all follow-up visits as told by your health care provider. This is important. Contact a health care provider if:  Your symptoms do not get better or they get worse.  You have a fever. Get help right away if:  You have new or worse pain in your neck or upper back.  You have severe pain that cannot be controlled with medicines.  You are dizzy.  You have vision problems, blurred vision, or double vision.  You have a severe headache that is worse when you stand.  You have nausea or you  vomit.  You develop new or worse numbness or tingling in your back or legs.  You have pain, redness, swelling, or warmth in your arm or leg. Summary  Spinal stenosis occurs when the open space (spinal canal) between the bones of your spine (vertebrae) narrows. This narrowing puts pressure on the spinal cord or nerves.  Spinal stenosis can cause numbness, weakness, or pain in the neck, back, and legs.  This condition may be caused by a birth defect, arthritic deterioration of your vertebrae, injury, tumors, or calcium deposits.  This condition is usually diagnosed with MRIs, CT scans, and X-rays. This information is not intended to replace advice given to you by your health care provider. Make sure you discuss any questions you have with your health care provider. Document  Released: 03/23/2003 Document Revised: 12/13/2016 Document Reviewed: 12/06/2015 Elsevier Patient Education  2020 [employer].  Spondylolysis  Spondylolysis is a small break or crack (stress fracture) in a bone in the spine (vertebra) in the lower back (lumbar spine). The stress fracture occurs on the bony mass between and behind the vertebra. Spondylolysis may be caused by an injury (trauma) or by overuse. Since the lower back is almost always under pressure from daily living, this stress fracture usually does not heal normally. Spondylolysis may eventually cause one vertebra to slip forward and out of place (spondylolisthesis). What are the causes? This condition may be caused by:  Trauma, such as a fall.  Excessive wear and tear. This is often a result of doing sports or physical activities that involve repetitive overstretching (hyperextension) and rotation of the spine. What increases the risk?  You are more likely to develop this condition if you have: ? A family history of this condition. ? An inward curvature of your spine (lordosis). ? A condition that affects your spine, such as spina bifida. You are  more likely to develop this condition if you participate in:  Gymnastics.  Dance.  Football.  Wrestling.  Martial arts.  Weight lifting.  Tennis.  Swimming. What are the signs or symptoms? Symptoms of this condition may include:  Long-lasting (chronic) pain in the lower back.  Stiffness in the back or legs.  Tightness in the hamstring muscles, which are in the backs of the thighs. In some cases, there may be no symptoms of this condition. How is this diagnosed? This condition may be diagnosed based on:  Your symptoms.  Your medical history.  A physical exam.  Imaging tests, such as: ? X-rays. ? CT scan. ? MRI. How is this treated? This condition may be treated by:  Resting. You may be asked to avoid or modify activities that put strain on your back until your symptoms improve.  Medicines to help relieve pain.  NSAIDs to help reduce swelling and discomfort.  Injections of medicine (cortisone) in your back. These injections can help to relieve pain and numbness.  A brace to stabilize and support your back.  Physical therapy. You may work with an occupational therapist or physical therapist who can teach you how to reduce pressure on your back while you do everyday activities.  Surgery. This may be needed if you have: ? A severe injury. ? Pain that lasts for more than 6 months. ? Numbness in your pelvic region. ? Changes in control of your stool or urine. Follow these instructions at home: Medicines  Take over-the-counter and prescription medicines only as told by your health care provider.  Ask your health care provider if the medicine prescribed to you: ? Requires you to avoid driving or using heavy machinery. ? Can cause constipation. You may need to take these actions to prevent or treat constipation:  Drink enough fluid to keep your urine pale yellow.  Take over-the-counter or prescription medicines.  Eat foods that are high in fiber, such as  beans, whole grains, and fresh fruits and vegetables.  Limit foods that are high in fat and processed sugars, such as fried or sweet foods. If you have a brace:  Wear the brace as told by your health care provider. Remove it only as told by your health care provider.  Keep the brace clean.  If the brace is not waterproof: ? Do not let it get wet. ? Cover it with a watertight covering when you  take a bath or a shower. Activity  Rest and return to your normal activities as told by your health care provider. Ask your health care provider what activities are safe for you.  Ask your health care provider when it is safe to drive if you have a back brace.  Work with a physical therapist to make a safe exercise program, as recommended by your health care provider. Do exercises as told by your physical therapist. This may include exercises to strengthen your back and abdominal muscles (core exercises). Managing pain, stiffness, and swelling      If directed, put ice on the affected area. ? If you have a removable brace, remove it as told by your health care provider. ? Put ice in a plastic bag. ? Place a towel between your skin and the bag. ? Leave the ice on for 20 minutes, 2-3 times a day.  If directed, apply heat to the affected area as often as told by your health care provider. Use the heat source that your health care provider recommends, such as a moist heat pack or a heating pad. ? If you have a removable brace, remove it as told by your health care provider. ? Place a towel between your skin and the heat source. ? Leave the heat on for 20-30 minutes. ? Remove the heat if your skin turns bright red. This is especially important if you are unable to feel pain, heat, or cold. You may have a greater risk of getting burned. General instructions  Do not use any products that contain nicotine or tobacco, such as cigarettes, e-cigarettes, and chewing tobacco. These can delay bone  healing. If you need help quitting, ask your health care provider.  Maintain a healthy weight. Extra weight puts stress on your back.  Keep all follow-up visits as told by your health care provider. This is important. Contact a health care provider if:  You have pain that gets worse or does not get better. Get help right away if:  You have severe back pain.  You have changes in control of your stool or urine.  You develop weakness or numbness in your legs.  You are unable to stand or walk. Summary  Spondylolysis is a small break or crack (stress fracture) in a bone in the spine (vertebra) in the lower back (lumbar spine).  This condition may be treated by resting, medicines, physical therapy, wearing a brace, or surgery.  Rest and return to your normal activities as told by your health care provider. Ask your health care provider what activities are safe for you.  Contact a health care provider if you have pain that gets worse or does not get better. This information is not intended to replace advice given to you by your health care provider. Make sure you discuss any questions you have with your health care provider. Document Released: 12/31/2004 Document Revised: 04/23/2018 Document Reviewed: 08/05/2017 Elsevier Patient Education  2020 [employer].

## 2018-10-02 NOTE — Telephone Encounter (Signed)
Glipizide Rx sent on 9.17.20

## 2018-10-02 NOTE — Telephone Encounter (Signed)
Yes saw yesterday but he wants to be back on glipizide Xl

## 2018-10-03 ENCOUNTER — Other Ambulatory Visit: Payer: Self-pay | Admitting: Internal Medicine

## 2018-10-03 DIAGNOSIS — E1165 Type 2 diabetes mellitus with hyperglycemia: Secondary | ICD-10-CM

## 2018-10-03 DIAGNOSIS — E118 Type 2 diabetes mellitus with unspecified complications: Secondary | ICD-10-CM

## 2018-10-03 NOTE — Progress Notes (Signed)
Order placed for referral to [facility].

## 2018-10-06 ENCOUNTER — Ambulatory Visit (INDEPENDENT_AMBULATORY_CARE_PROVIDER_SITE_OTHER): Payer: Medicare Other

## 2018-10-06 ENCOUNTER — Other Ambulatory Visit: Payer: Self-pay

## 2018-10-06 DIAGNOSIS — Z0000 Encounter for general adult medical examination without abnormal findings: Secondary | ICD-10-CM

## 2018-10-06 NOTE — Patient Instructions (Addendum)
  [name] [name] , Thank you for taking time to come for your Medicare Wellness Visit. I appreciate your ongoing commitment to your health goals. Please review the following plan we discussed and let me know if I can assist you in the future.   These are the goals we discussed: Goals    . DIET - INCREASE WATER INTAKE    . Increase physical activity     Walk daily 30 minutes       This is a list of the screening recommended for you and due dates:  Health Maintenance  Topic Date Due  . Pneumonia vaccines (2 of 2 - PPSV23) 01/15/2016  . Flu Shot  04/14/2019*  . Hemoglobin A1C  03/28/2019  . Complete foot exam   05/28/2019  . Eye exam for diabetics  08/15/2019  . Tetanus Vaccine  01/15/2023  *Topic was postponed. The date shown is not the original due date.

## 2018-10-06 NOTE — Progress Notes (Addendum)
Subjective:   [name] [name] is a 78 y.o. male who presents for Medicare Annual/Subsequent preventive examination.  Review of Systems:  No ROS.  Medicare Wellness Virtual Visit.  Visual/audio telehealth visit, UTA vital signs.   See social history for additional risk factors.   Cardiac Risk Factors include: advanced age (>86mn, >>23women);male gender;hypertension;diabetes mellitus     Objective:    Vitals: There were no vitals taken for this visit.  There is no height or weight on file to calculate BMI.  Advanced Directives 10/06/2018 01/02/2018 01/01/2018 12/22/2017 07/04/2017  Does Patient Have a Medical Advance Directive? Yes Yes Yes No Yes  Type of [place]amed[place]of [place] will [place] will Living will;Healthcare Power of [place] will  Does patient want to make changes to medical advance directive? No - Patient declined - No - Patient declined - No - Patient declined  Copy of [place] Chart? 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 noted on TTE 2015   Arthritis    CAD (coronary artery disease)    a. remote PCI in 1991 and 2005; b. MV 3/15: old inferior MI, no ischemia, LVEF 50%, slight inferior wall hypokniesis   Chicken pox    Colon  polyps    4 pre-cancerous    Diverticulitis    [place] type 2 [place]di[place] 2[place]    Family history of adverse reaction to anesthesia    GERD (gastroesophageal reflux disease)    Heart murmur    History of kidney stones    Hyperlipidemia    Hypertension    Kidney stones    [place]an[place] ([place]   Resected from his back   Mitral regurgitation    a. noted on TTE 2015   Myocardial infarction ([place]    Systolic dysfunction    a. TTE 2015: EF  50-55%, mild global hypokinesis, mild to moderate aortic sclerosis, mild aortic insufficiency, mild to moderate mitral regurgitation, mild tricuspid regurgitation, moderately dilated left atrium, mildly dilated right ventricle, PASP 31 mmHg   Past Surgical History:  Procedure Laterality Date   [place]  [place]   CYSTOSCOPY/URETEROSCOPY/HOLMIUM LASER/STENT PLACEMENT Right 01/02/2018   Procedure: CYSTOSCOPY/URETEROSCOPY/HOLMIUM LASER/STENT PLACEMENT;  Surgeon: SBilley Co MD;  Location: [facility] ORS;  Service: Urology;  Laterality: Right;   ESOPHAGOGASTRODUODENOSCOPY (EGD) WITH PROPOFOL N/A 11/19/2016   Procedure: ESOPHAGOGASTRODUODENOSCOPY (EGD) WITH PROPOFOL;  Surgeon: WLucilla Lame MD;  Location: [facility] ENDOSCOPY;  Service: Endoscopy;  Laterality: N/A;   [place]   NERVE SURGERY  2015   ulna nerve   TONSILLECTOMY  [place] EXTRACTION     Family History  Problem Relation Age of Onset   Hyperlipidemia Mother    Hypertension Mother    Heart disease Mother    Diabetes Mother    Heart attack Mother    Colon cancer Father    Lung cancer Father    Kidney cancer Father        malignant capsulated kidney tumor   Diabetes Father    Liver cancer Father    Bladder Cancer Neg Hx    Prostate cancer Neg Hx    Social History   Socioeconomic History   Marital status:  Married    Spouse name: Not on file   Number of children: Not on file   Years of education: Not on file   Highest education level: Not on file  Occupational History   Not on file  Social Needs   Financial resource strain: Not hard at all   Food insecurity    Worry: Never true    Inability: Never true   Transportation needs    Medical: No    Non-medical: No  Tobacco Use   Smoking status: Current Some Day Smoker    Types: Pipe   Smokeless tobacco: Former Systems developer    Quit date: 10/17/2015  Substance and Sexual Activity   Alcohol use: Yes    Alcohol/week: 0.0 - 1.0 standard drinks    Comment: none last 24hrs   Drug use: No   Sexual activity: Not on file  Lifestyle   Physical activity    Days per week: 3 days    Minutes per session: 30 min   Stress: Not at all  Relationships   Social connections    Talks on phone: Not on file    Gets together: Not on file    Attends religious service: Not on file    Active member of club or organization: Not on file    Attends meetings of clubs or organizations: Not on file    Relationship status: Not on file  Other Topics Concern   Not on file  Social History Narrative   Married     Outpatient Encounter Medications as of 10/06/2018  Medication Sig   acetaminophen (TYLENOL) 650 MG CR tablet Take 650 mg by mouth 2 (two) times a day.   aspirin EC 81 MG tablet Take 81 mg by mouth daily.    blood glucose meter kit and supplies KIT Dispense based on patient and insurance preference. Use up to four times daily as directed. (FOR ICD-9 250.00, 250.01).   carvedilol (COREG) 3.125 MG tablet TAKE 1 TABLET(3.125 MG) BY MOUTH TWICE DAILY   fluticasone (FLONASE) 50 MCG/ACT nasal spray Place 2 sprays into both nostrils daily. (Patient taking differently: Place 2 sprays into both nostrils daily as needed for allergies. )   glipiZIDE (GLUCOTROL XL) 5 MG 24 hr tablet Take 1 tablet (5 mg total) by mouth 2 (two) times daily. With food    glucose blood (CONTOUR TEST) test strip 1 each by Other route 2 (two) times daily. Use as instructed   glucose blood test strip Use as instructed to check blood sugar up to three times daily. E11.42   hydrochlorothiazide (HYDRODIURIL) 25 MG tablet TAKE 1 TABLET(25 MG) BY MOUTH DAILY   Lancets MISC 1 Device by Does not apply route 2 (two) times daily.   lisinopril (ZESTRIL) 10 MG tablet TAKE 1 TABLET BY MOUTH DAILY   Multiple Vitamin (MULTIVITAMIN) capsule Take 1  capsule by mouth daily with lunch.    naproxen sodium (ALEVE) 220 MG tablet Take 220 mg by mouth 2 (two) times daily as needed (for pain or headache).   Omega-3 Fatty Acids (FISH OIL PO) Take 1 capsule by mouth daily.    pantoprazole (PROTONIX) 40 MG tablet Take 1 tablet (40 mg total) by mouth 2 (two) times daily. **PLEASE SCHEDULE FOLLOW UP APPT**   Polyvinyl Alcohol-Povidone (MURINE TEARS FOR DRY EYES OP) Place 1-2 drops into both eyes daily as needed (for dry eyes).   Potassium 99 MG TABS Take 1 tablet by mouth daily.   psyllium (METAMUCIL) 58.6 % powder Take 1 packet by mouth at bedtime.    tamsulosin (FLOMAX) 0.4 MG CAPS capsule TAKE 1 CAPSULE(0.4 MG) BY MOUTH DAILY   traMADol (ULTRAM) 50 MG tablet TAKE 1 TABLET(50 MG) BY MOUTH EVERY 12 HOURS AS NEEDED   ezetimibe (ZETIA) 10 MG tablet Take 1 tablet (10 mg total) by mouth daily.   rosuvastatin (CRESTOR) 10 MG tablet Take 1 tablet (10 mg total) by mouth daily.   No facility-administered encounter medications on file as of 10/06/2018.     Activities of Daily Living In your present state of health, do you have any difficulty performing the following activities: 10/06/2018 01/01/2018  Hearing? Y Y  Comment Hearing aids -  Vision? N N  Difficulty concentrating or making decisions? N N  Walking or climbing stairs? N N  Dressing or bathing? N N  Doing errands, shopping? N N  Preparing Food and eating ? N -  Using the Toilet? N -  In the past six months, have you  accidently leaked urine? N -  Do you have problems with loss of bowel control? N -  Managing your Medications? N -  Managing your Finances? N -  Housekeeping or managing your Housekeeping? N -  Some recent data might be hidden    Patient Care Team: [name], [name] as PCP - General (Family Medicine)   Assessment:   This is a routine wellness examination for Yazeed.  I connected with patient 10/06/18 at 10:30 AM EDT by an audio enabled telemedicine application and verified that I am speaking with the correct person using two identifiers. Patient stated full name and DOB. Patient gave permission to continue with virtual visit. Patient's location was at home and Nurse's location was at [place] office.   Health Maintenance Due: -Influenza vaccine 2020- discussed; to be completed in season with doctor or local pharmacy.   -PNA - discussed; to be completed with doctor in visit or local pharmacy.  Update all pending maintenance due as appropriate.   See completed HM at the end of note.   Eye: Visual acuity not assessed. Virtual visit. Wears corrective lenses. Followed by their ophthalmologist every 6 months.  Retinopathy- none reported. Glaucoma suspect; monitoring.  Dental: Visits every 6 months.    Hearing: Hearing aids- yes  Safety:  Patient feels safe at home- yes Patient does have smoke detectors at home- yes Patient does wear sunscreen or protective clothing when in direct sunlight - yes Patient does wear seat belt when in a moving vehicle - yes Patient drives- yes Adequate lighting in walkways free from debris- yes Grab bars and handrails used as appropriate- yes Ambulates with no assistive device Cell phone or lifeline/life alert/medic alert on person when ambulating outside of the home- yes  Social: Alcohol intake - yes      Smoking history- current Illicit drug use? none  Depression: PHQ 2 &9 complete. See screening below. Denies irritability, anhedonia,  sadness/tearfullness.  Stable.   Falls: See screening below.    Medication: Taking as directed and without issues.   Covid-19: Precautions and sickness symptoms discussed. Wears mask, social distancing, hand hygiene as appropriate.   Activities of Daily Living Patient denies needing assistance with: household chores, feeding themselves, getting from bed to chair, getting to the toilet, bathing/showering, dressing, managing money, or preparing meals.   Memory: Patient is alert. Patient denies difficulty focusing or concentrating. Correctly identified the president of the [place], season and recall. Patient likes to read for brain stimulation.  BMI- discussed the importance of a healthy diet, water intake and the benefits of aerobic exercise.  Educational material provided.  Physical activity- walking, no routine.  He is just getting started again.  Encouraged 150 minutes per week.  Physical therapy 3 days weekly.   Diet: Regular; portion control.  Water: 2 cups  Caffeine: 5 cups of coffee Ensure/Protein supplement: no  Advanced Directive: End of life planning; Advance aging; Advanced directives discussed.  Copy of current HCPOA/Living Will requested.    Other Providers Patient Care Team: [name], [name] as PCP - General (Family Medicine)  Exercise Activities and Dietary recommendations Current Exercise Habits: Home exercise routine, Type of exercise: walking, Time (Minutes): 40, Frequency (Times/Week): 3, Weekly Exercise (Minutes/Week): 120, Intensity: Mild  Goals     DIET - INCREASE WATER INTAKE     Increase physical activity     Walk daily 30 minutes       Fall Risk Fall Risk  10/06/2018 09/08/2018 05/15/2017 09/09/2016 04/22/2016  Falls in the past year? 0 1 No No No  Number falls in past yr: - 0 - - -  Injury with Fall? - 0 - - -  Follow up - Falls evaluation completed - - -   Timed Get Up and Go Performed: no, virtual visit  Depression Screen PHQ 2/9 Scores  10/06/2018 05/15/2017 09/09/2016 04/22/2016  PHQ - 2 Score 0 0 0 0    Cognitive Function     6CIT Screen 10/06/2018 07/04/2017  What Year? 0 points 0 points  What month? 0 points 0 points  What time? 0 points 0 points  Count back from 20 0 points 0 points  Months in reverse 0 points 0 points  Repeat phrase 0 points 0 points  Total Score 0 0    Immunization History  Administered Date(s) Administered   Influenza, High Dose Seasonal PF 11/01/2015, 10/20/2017   Influenza,inj,Quad PF,6+ Mos 10/08/2016   Screening Tests Health Maintenance  Topic Date Due   PNA vac Low Risk Adult (2 of 2 - PPSV23) 01/15/2016   INFLUENZA VACCINE  04/14/2019 (Originally 08/15/2018)   HEMOGLOBIN A1C  03/28/2019   FOOT EXAM  05/28/2019   OPHTHALMOLOGY EXAM  08/15/2019   TETANUS/TDAP  01/15/2023      Plan:   Keep all routine maintenance appointments.   Physical therapy for low back and neck pain consultation 10/09/18.   Awaiting follow up with [facility] for diabetes. Referral placed by Dr. Terese Door.  Medicare Attestation I have personally reviewed: The patient's medical and social history Their use of alcohol, tobacco or illicit drugs Their current medications and supplements The patient's functional ability including ADLs,fall risks, home safety risks, cognitive, and hearing and visual impairment Diet and physical activities Evidence for depression   In addition, I have reviewed and discussed with patient certain preventive protocols, quality metrics, and best practice  recommendations. A written personalized care plan for preventive services as well as general preventive health recommendations were provided to patient via mail.     OBrien-Blaney, Aristotelis Vilardi L, LPN  [id number]   I have reviewed the above information and agree with above.   Deborra Medina, MD

## 2018-10-12 ENCOUNTER — Other Ambulatory Visit: Payer: Self-pay

## 2018-10-12 DIAGNOSIS — R200 Anesthesia of skin: Secondary | ICD-10-CM | POA: Diagnosis not present

## 2018-10-12 MED ORDER — TRAMADOL HCL 50 MG PO TABS: ORAL_TABLET | ORAL | 1 refills | Status: DC

## 2018-10-13 DIAGNOSIS — M542 Cervicalgia: Secondary | ICD-10-CM | POA: Diagnosis not present

## 2018-10-13 DIAGNOSIS — M545 Low back pain: Secondary | ICD-10-CM | POA: Diagnosis not present

## 2018-10-15 DIAGNOSIS — M542 Cervicalgia: Secondary | ICD-10-CM | POA: Diagnosis not present

## 2018-10-15 DIAGNOSIS — M545 Low back pain: Secondary | ICD-10-CM | POA: Diagnosis not present

## 2018-10-19 ENCOUNTER — Other Ambulatory Visit: Payer: Self-pay

## 2018-10-19 ENCOUNTER — Encounter: Payer: Self-pay | Admitting: Internal Medicine

## 2018-10-19 DIAGNOSIS — Z20828 Contact with and (suspected) exposure to other viral communicable diseases: Secondary | ICD-10-CM

## 2018-10-19 DIAGNOSIS — Z20822 Contact with and (suspected) exposure to covid-19: Secondary | ICD-10-CM

## 2018-10-21 LAB — NOVEL CORONAVIRUS, NAA: SARS-CoV-2, NAA: NOT DETECTED

## 2018-10-22 ENCOUNTER — Ambulatory Visit: Payer: Medicare Other | Admitting: Internal Medicine

## 2018-10-22 ENCOUNTER — Ambulatory Visit (INDEPENDENT_AMBULATORY_CARE_PROVIDER_SITE_OTHER): Payer: Medicare Other | Admitting: Pharmacist

## 2018-10-22 DIAGNOSIS — E1165 Type 2 diabetes mellitus with hyperglycemia: Secondary | ICD-10-CM

## 2018-10-22 NOTE — Chronic Care Management (AMB) (Signed)
Chronic Care Management   Note  10/22/2018 Name: [name] [name] MRN: [id number] DOB: [birth year]   Subjective:  [name] [name] is a 78 y.o. year old male who is a primary care patient of [place], [name], [name], however, as [name] is on leave, his care has been managed by Dr. Olivia Mackie McLean-Scocuzza.   The CCM team was consulted for assistance with chronic disease management and care coordination needs.     [name] was given information about Chronic Care Management services today including:  1. CCM service includes personalized support from designated clinical staff supervised by his physician, including individualized plan of care and coordination with other care providers 2. 24/7 contact phone numbers for assistance for urgent and routine care needs. 3. Service will only be billed when office clinical staff spend 20 minutes or more in a month to coordinate care. 4. Only one practitioner may furnish and bill the service in a calendar month. 5. The patient may stop CCM services at any time (effective at the end of the month) by phone call to the office staff. 6. The patient will be responsible for cost sharing (co-pay) of up to 20% of the service fee (after annual deductible is met).  Patient agreed to services and verbal consent obtained.   Review of patient status, including review of consultants reports, laboratory and other test data, was performed as part of comprehensive evaluation and provision of chronic care management services.   Objective:  Lab Results  Component Value Date   CREATININE 0.90 08/06/2018   CREATININE 0.70 06/03/2018   CREATININE 0.86 03/02/2018    Lab Results  Component Value Date   HGBA1C 8.2 (H) 09/28/2018       Component Value Date/Time   CHOL 136 03/02/2018 0822   TRIG 143.0 03/02/2018 0822   HDL 36.50 (L) 03/02/2018 0822   CHOLHDL 4 03/02/2018 0822   VLDL 28.6 03/02/2018 0822   LDLCALC 70 03/02/2018 0822   LDLDIRECT 49.0  05/01/2016 0940    Clinical ASCVD: Yes    BP Readings from Last 3 Encounters:  10/01/18 140/70  09/08/18 112/72  08/13/18 116/70    Allergies  Allergen Reactions  . Metformin And Related Diarrhea    Diarrhea, leg cramps  . Azithromycin Other (See Comments)    Not recommended - no reaction    Medications Reviewed Today    Reviewed by De Hollingshead, [facility] (Pharmacist) on 10/22/18 at 1025  Med List Status: <None>  Medication Order Taking? Sig Documenting Provider Last Dose Status Informant  acetaminophen (TYLENOL) 650 MG CR tablet [id number] No Take 650 mg by mouth 2 (two) times a day. [provider] Not Taking Active   aspirin EC 81 MG tablet [id number] Yes Take 81 mg by mouth daily.  [provider] Taking Active Self  blood glucose meter kit and supplies KIT [id number] Yes Dispense based on patient and insurance preference. Use up to four times daily as directed. (FOR ICD-9 250.00, 250.01). [name], [name] Taking Active   carvedilol (COREG) 3.125 MG tablet [id number] Yes TAKE 1 TABLET(3.125 MG) BY MOUTH TWICE DAILY [name], [name], MD Taking Active   ezetimibe (ZETIA) 10 MG tablet [id number] Yes Take 1 tablet (10 mg total) by mouth daily. [name], MD Taking Active   fluticasone [facility]) 50 MCG/ACT nasal spray [id number] Yes Place 2 sprays into both nostrils daily.  Patient taking differently: Place 2 sprays into both nostrils daily as needed for allergies.    Coral Spikes,  DO Taking Active Self  glipiZIDE (GLUCOTROL XL) 5 MG 24 hr tablet [id number] Yes Take 1 tablet (5 mg total) by mouth 2 (two) times daily. With food McLean-Scocuzza, Nino Glow, MD Taking Active   glucose blood (CONTOUR TEST) test strip [id number] Yes 1 each by Other route 2 (two) times daily. Use as instructed McLean-Scocuzza, Nino Glow, MD Taking Active   glucose blood test strip [id number] Yes Use as instructed to check blood sugar up to three times daily. E11.42 [name],  [name] Taking Active Self  hydrochlorothiazide (HYDRODIURIL) 25 MG tablet [id number] Yes TAKE 1 TABLET(25 MG) BY MOUTH DAILY [name], [name] Taking Active   Lancets MISC [id number] Yes 1 Device by Does not apply route 2 (two) times daily. McLean-Scocuzza, Nino Glow, MD Taking Active   lisinopril (ZESTRIL) 10 MG tablet [id number] Yes TAKE 1 TABLET BY MOUTH DAILY [name], [name] Taking Active   Multiple Vitamin (MULTIVITAMIN) capsule [id number] Yes Take 1 capsule by mouth daily with lunch.  [provider] Taking Active Self  naproxen sodium (ALEVE) 220 MG tablet [id number] Yes Take 220 mg by mouth 2 (two) times daily as needed (for pain or headache). [provider] Taking Active Self           Med Note Nat Christen Oct 22, 2018 10:23 AM)    Omega-3 Fatty Acids (FISH OIL PO) [id number] Yes Take 1 capsule by mouth daily.  [provider] Taking Active Self           Med Note Mayo Ao Jul 13, 2018  4:06 PM)    pantoprazole (PROTONIX) 40 MG tablet [id number] Yes Take 1 tablet (40 mg total) by mouth 2 (two) times daily. **PLEASE SCHEDULE FOLLOW UP APPTAllen Norris, Darren, MD Taking Active   Polyvinyl Alcohol-Povidone (MURINE TEARS FOR DRY EYES OP) [id number] Yes Place 1-2 drops into both eyes daily as needed (for dry eyes). [provider] Taking Active Self  Potassium 99 MG TABS [id number] Yes Take 1 tablet by mouth daily. [provider] Taking Active            Med Note Darnelle Maffucci, Arville Lime   Thu Oct 22, 2018 10:24 AM) QOD   psyllium (METAMUCIL) 58.6 % powder [id number] Yes Take 1 packet by mouth at bedtime.  [provider] Taking Active Self           Med Note Mayo Ao Jul 13, 2018  4:09 PM) PRN  rosuvastatin (CRESTOR) 10 MG tablet [id number] Yes Take 1 tablet (10 mg total) by mouth daily. [name], MD Taking Active   tamsulosin (FLOMAX) 0.4 MG CAPS capsule [id number] Yes TAKE 1 CAPSULE(0.4  MG) BY MOUTH DAILY [name], MD Taking Active Self  traMADol (ULTRAM) 50 MG tablet [id number] Yes TAKE 1 TABLET(50 MG) BY MOUTH EVERY 12 HOURS AS NEEDED Leone Haven, MD Taking Active            Assessment:   Goals Addressed            This Visit's Progress     Patient Stated   . "I want to work on my blood sugars" (pt-stated)       Current Barriers:  . Diabetes: uncontrolled as evidenced by BG; most recent A1c 8.2%  o Patient concerned about elevation in BG over the past few weeks, even with increased exercise and focus on dietary choices.  o Notes  he has had sx sinus infection for over 3 weeks now. Notes he takes Flonase PRN, is NOT taking a daily antihistamine. Reports that he has gone through multiple rounds of antibiotics and they have not seemed to help . Current antihyperglycemic regimen: glipizide XL 5 mg BID o Notes the switch to XL did not seem to make a difference  o No benefit from "Blood Balance" supplementation w/ cinnamon o Has been unable to tolerate metformin, IR and ER, d/t diarrhea . Denies any issues w/ hypoglycemia . Endorses polyuria over the past few months; though also endorses BPH o Currently working w/ urology regarding kidney stones  . Current exercise: physical therapy for lumbar area - has NOT received steroid injections   o Walking daily as well . Current blood glucose readings: Fasting ~180s; 1 hour before supper ~170s o Lowest 154, highest to 258 . Cardiovascular risk reduction: o Current hypertensive regimen: HCTZ 25 mg QAM, lisinopril 10 mg QAM o Current hyperlipidemia regimen: rosuvastatin 10 mg daily, ezetimibe 10 mg daily   Pharmacist Clinical Goal(s):  [name] Over the next 90 days, patient will work with PharmD and primary care provider to address optimized medication management  Interventions: . Comprehensive medication review performed, medication list updated in electronic medical record . Discussed progressive nature of  diabetes; infections or stress can cause elevations in sugars, but A1c has not been at goal over the past year regardless . Reviewed hx metformin therapy. Discussed that sulfonylureas may eventually lose efficacy, and changing to a new medication would be a recommendation  . Discussed GLP1 vs SGLT2, long term CV benefits, as well as side effects. D/t current concerns with BPH, would avoid SGLT2 and choose GLP1. Discussed that Ozempic demonstrated better A1c reduction vs Trulicity - patient amenable to starting Ozempic. He sees Dr. Tyler Aas tomorrow, will collaborate w/ her on this recommendation . Reviewed income information; patient elligible for Ozempic patient assistance for [employer]. Reviewed that I will need copy of 2019 tax return, copy of prescription insurance card, as well as signed application. Will collaborate w/ Dr. Terese Door for her signature.  . Discussed with patient that first line tx for sinus infections is generally combination daily antihistamine + daily intranasal corticosteroid, instead of PRN intranasal corticosteroid that he is currently doing. He will discuss this w/ Dr. Terese Door tomorrow.   Patient Self Care Activities:  . Patient will check blood glucose BID , document, and provide at future appointments . Patient will take medications as prescribed . Patient will report any questions or concerns to provider   Initial goal documentation        Plan: - Will collaborate w/ patient and provider as above.   [name], PharmD, [place] Pharmacist [facility] [place] [phone]

## 2018-10-22 NOTE — Patient Instructions (Signed)
Visit Information  Goals Addressed            This Visit's Progress     Patient Stated   . "I want to work on my blood sugars" (pt-stated)       Current Barriers:  . Diabetes: uncontrolled as evidenced by BG; most recent A1c 8.2%  o Patient concerned about elevation in BG over the past few weeks, even with increased exercise and focus on dietary choices.  o Notes he has had sx sinus infection for over 3 weeks now. Notes he takes Flonase PRN, is NOT taking a daily antihistamine. Reports that he has gone through multiple rounds of antibiotics and they have not seemed to help . Current antihyperglycemic regimen: glipizide XL 5 mg BID o Notes the switch to XL did not seem to make a difference  o No benefit from "Blood Balance" supplementation w/ cinnamon o Has been unable to tolerate metformin, IR and ER, d/t diarrhea . Denies any issues w/ hypoglycemia . Endorses polyuria over the past few months; though also endorses BPH o Currently working w/ urology regarding kidney stones  . Current exercise: physical therapy for lumbar area - has NOT received steroid injections   o Walking daily as well . Current blood glucose readings: Fasting ~180s; 1 hour before supper ~170s o Lowest 154, highest to 258 . Cardiovascular risk reduction: o Current hypertensive regimen: HCTZ 25 mg QAM, lisinopril 10 mg QAM o Current hyperlipidemia regimen: rosuvastatin 10 mg daily, ezetimibe 10 mg daily   Pharmacist Clinical Goal(s):  [name] Over the next 90 days, patient will work with PharmD and primary care provider to address optimized medication management  Interventions: . Comprehensive medication review performed, medication list updated in electronic medical record . Discussed progressive nature of diabetes; infections or stress can cause elevations in sugars, but A1c has not been at goal over the past year regardless . Reviewed hx metformin therapy. Discussed that sulfonylureas may eventually lose efficacy,  and changing to a new medication would be a recommendation  . Discussed GLP1 vs SGLT2, long term CV benefits, as well as side effects. D/t current concerns with BPH, would avoid SGLT2 and choose GLP1. Discussed that Ozempic demonstrated better A1c reduction vs Trulicity - patient amenable to starting Ozempic. He sees Dr. Tyler Aas tomorrow, will collaborate w/ her on this recommendation . Reviewed income information; patient elligible for Ozempic patient assistance for [employer]. Reviewed that I will need copy of 2019 tax return, copy of prescription insurance card, as well as signed application. Will collaborate w/ Dr. Terese Door for her signature.  . Discussed with patient that first line tx for sinus infections is generally combination daily antihistamine + daily intranasal corticosteroid, instead of PRN intranasal corticosteroid that he is currently doing. He will discuss this w/ Dr. Terese Door tomorrow.   Patient Self Care Activities:  . Patient will check blood glucose BID , document, and provide at future appointments . Patient will take medications as prescribed . Patient will report any questions or concerns to provider   Initial goal documentation        The patient verbalized understanding of instructions provided today and declined a print copy of patient instruction materials.    Plan: - Will collaborate w/ patient and provider as above.   [name], PharmD, [place] Pharmacist [facility] [place] [phone]

## 2018-10-23 ENCOUNTER — Encounter: Payer: Self-pay | Admitting: Internal Medicine

## 2018-10-23 ENCOUNTER — Other Ambulatory Visit: Payer: Self-pay

## 2018-10-23 ENCOUNTER — Ambulatory Visit (INDEPENDENT_AMBULATORY_CARE_PROVIDER_SITE_OTHER): Payer: Medicare Other | Admitting: Internal Medicine

## 2018-10-23 VITALS — Ht 73.0 in | Wt 200.0 lb

## 2018-10-23 DIAGNOSIS — R319 Hematuria, unspecified: Secondary | ICD-10-CM

## 2018-10-23 DIAGNOSIS — R109 Unspecified abdominal pain: Secondary | ICD-10-CM

## 2018-10-23 DIAGNOSIS — J309 Allergic rhinitis, unspecified: Secondary | ICD-10-CM | POA: Diagnosis not present

## 2018-10-23 DIAGNOSIS — N200 Calculus of kidney: Secondary | ICD-10-CM

## 2018-10-23 DIAGNOSIS — R0982 Postnasal drip: Secondary | ICD-10-CM | POA: Diagnosis not present

## 2018-10-23 DIAGNOSIS — E1165 Type 2 diabetes mellitus with hyperglycemia: Secondary | ICD-10-CM

## 2018-10-23 MED ORDER — OZEMPIC (0.25 OR 0.5 MG/DOSE) 2 MG/1.5ML ~~LOC~~ SOPN: 0.2500 mg | PEN_INJECTOR | SUBCUTANEOUS | 11 refills | Status: DC

## 2018-10-23 MED ORDER — IPRATROPIUM BROMIDE 0.06 % NA SOLN: 2.0000 | Freq: Three times a day (TID) | NASAL | 12 refills | Status: DC

## 2018-10-23 MED ORDER — PEN NEEDLES 30G X 8 MM MISC: 1.0000 | 3 refills | Status: DC

## 2018-10-23 NOTE — Progress Notes (Signed)
Virtual Visit via Video Note  I connected with [name] [name]   on 10/23/18 at 11:51 AM EDT by a video enabled telemedicine application and verified that I am speaking with the correct person using two identifiers.  Location patient: home Location provider:work or home office Persons participating in the virtual visit: patient, provider, pts wife    I discussed the limitations of evaluation and management by telemedicine and the availability of in person appointments. The patient expressed understanding and agreed to proceed.   HPI: 1. O2 today is 95% 2. DM 2 cbg in am still 181 on glip xr 5 mg bid. He was doing sweets, breads, pastas, starches and V8 juice but has cut back agreeable to try ozempic if insurance will cover  3. C/o b/l kidney stones and noted CT scan in 2018 and PT used mechanical vibrator on back for low back pain and then developed left back pain mild to moderate not sure if kidney stone with flank pain and h/o blood in urine f/u urology Dr. Diamantina Providence  4. Chronic sinus issues not using NS, Flonase consistently has claritin but not taking  Has ENT Dr. Tami Ribas   ROS: See pertinent positives and negatives per HPI.  Past Medical History:  Diagnosis Date  . Aortic insufficiency    a. noted on TTE 2015  . Arthritis   . CAD (coronary artery disease)    a. remote PCI in 1991 and 2005; b. MV 3/15: old inferior MI, no ischemia, LVEF 50%, slight inferior wall hypokniesis  . Chicken pox   . Colon polyps    4 pre-cancerous   . Diverticulitis   . DM type 2 (diabetes mellitus, type 2) ([place])   . Family history of adverse reaction to anesthesia   . GERD (gastroesophageal reflux disease)   . Heart murmur   . History of kidney stones   . Hyperlipidemia   . Hypertension   . Kidney stones   . Melanoma ([place]) 1980   Resected from his back  . Mitral regurgitation    a. noted on TTE 2015  . Myocardial infarction ([place])   . Systolic dysfunction    a. TTE 2015: EF  50-55%, mild global  hypokinesis, mild to moderate aortic sclerosis, mild aortic insufficiency, mild to moderate mitral regurgitation, mild tricuspid regurgitation, moderately dilated left atrium, mildly dilated right ventricle, PASP 31 mmHg    Past Surgical History:  Procedure Laterality Date  . CHOLECYSTECTOMY  2012  . COLONOSCOPY    . CORONARY ANGIOPLASTY WITH STENT PLACEMENT  1991 & 2005  . CYSTOSCOPY/URETEROSCOPY/HOLMIUM LASER/STENT PLACEMENT Right 01/02/2018   Procedure: CYSTOSCOPY/URETEROSCOPY/HOLMIUM LASER/STENT PLACEMENT;  Surgeon: [name], MD;  Location: [facility] ORS;  Service: Urology;  Laterality: Right;  . ESOPHAGOGASTRODUODENOSCOPY (EGD) WITH PROPOFOL N/A 11/19/2016   Procedure: ESOPHAGOGASTRODUODENOSCOPY (EGD) WITH PROPOFOL;  Surgeon: [name], MD;  Location: [facility] ENDOSCOPY;  Service: Endoscopy;  Laterality: N/A;  . LITHOTRIPSY  2015  . MELANOMA EXCISION  1980   malignant  . NERVE SURGERY  2015   ulna nerve  . TONSILLECTOMY  1945  . WISDOM TOOTH EXTRACTION      Family History  Problem Relation Age of Onset  . Hyperlipidemia Mother   . Hypertension Mother   . Heart disease Mother   . Diabetes Mother   . Heart attack Mother   . Colon cancer Father   . Lung cancer Father   . Kidney cancer Father        malignant capsulated kidney tumor  .  Diabetes Father   . Liver cancer Father   . Bladder Cancer Neg Hx   . Prostate cancer Neg Hx     SOCIAL HX: married     Current Outpatient Medications:  .  acetaminophen (TYLENOL) 650 MG CR tablet, Take 650 mg by mouth 2 (two) times a day., Disp: , Rfl:  .  aspirin EC 81 MG tablet, Take 81 mg by mouth daily. , Disp: , Rfl:  .  blood glucose meter kit and supplies KIT, Dispense based on patient and insurance preference. Use up to four times daily as directed. (FOR ICD-9 250.00, 250.01)., Disp: 1 each, Rfl: 0 .  carvedilol (COREG) 3.125 MG tablet, TAKE 1 TABLET(3.125 MG) BY MOUTH TWICE DAILY, Disp: 180 tablet, Rfl: 0 .  ezetimibe (ZETIA)  10 MG tablet, Take 1 tablet (10 mg total) by mouth daily., Disp: 90 tablet, Rfl: 3 .  fluticasone (FLONASE) 50 MCG/ACT nasal spray, Place 2 sprays into both nostrils daily. (Patient taking differently: Place 2 sprays into both nostrils daily as needed for allergies. ), Disp: 16 g, Rfl: 6 .  glipiZIDE (GLUCOTROL XL) 5 MG 24 hr tablet, Take 1 tablet (5 mg total) by mouth 2 (two) times daily. With food, Disp: 180 tablet, Rfl: 3 .  glucose blood (CONTOUR TEST) test strip, 1 each by Other route 2 (two) times daily. Use as instructed, Disp: 200 each, Rfl: 3 .  glucose blood test strip, Use as instructed to check blood sugar up to three times daily. E11.42, Disp: 100 each, Rfl: 12 .  hydrochlorothiazide (HYDRODIURIL) 25 MG tablet, TAKE 1 TABLET(25 MG) BY MOUTH DAILY, Disp: 90 tablet, Rfl: 0 .  Insulin Pen Needle (PEN NEEDLES) 30G X 8 MM MISC, 1 Device by Does not apply route once a week., Disp: 90 each, Rfl: 3 .  ipratropium (ATROVENT) 0.06 % nasal spray, Place 2 sprays into both nostrils 3 (three) times daily., Disp: 15 mL, Rfl: 12 .  Lancets MISC, 1 Device by Does not apply route 2 (two) times daily., Disp: 180 each, Rfl: 3 .  lisinopril (ZESTRIL) 10 MG tablet, TAKE 1 TABLET BY MOUTH DAILY, Disp: 90 tablet, Rfl: 1 .  Multiple Vitamin (MULTIVITAMIN) capsule, Take 1 capsule by mouth daily with lunch. , Disp: , Rfl:  .  naproxen sodium (ALEVE) 220 MG tablet, Take 220 mg by mouth 2 (two) times daily as needed (for pain or headache)., Disp: , Rfl:  .  Omega-3 Fatty Acids (FISH OIL PO), Take 1 capsule by mouth daily. , Disp: , Rfl:  .  pantoprazole (PROTONIX) 40 MG tablet, Take 1 tablet (40 mg total) by mouth 2 (two) times daily. **PLEASE SCHEDULE FOLLOW UP APPT**, Disp: 60 tablet, Rfl: 6 .  Polyvinyl Alcohol-Povidone (MURINE TEARS FOR DRY EYES OP), Place 1-2 drops into both eyes daily as needed (for dry eyes)., Disp: , Rfl:  .  Potassium 99 MG TABS, Take 1 tablet by mouth daily., Disp: , Rfl:  .  psyllium  (METAMUCIL) 58.6 % powder, Take 1 packet by mouth at bedtime. , Disp: , Rfl:  .  rosuvastatin (CRESTOR) 10 MG tablet, Take 1 tablet (10 mg total) by mouth daily., Disp: 90 tablet, Rfl: 3 .  Semaglutide,0.25 or 0.'5MG'$ /DOS, (OZEMPIC, 0.25 OR 0.5 MG/DOSE,) 2 MG/1.5ML SOPN, Inject 0.25 mg into the skin once a week. Can increase to 0.5 after 1 week if cbg in am not <90-130, Disp: 5 pen, Rfl: 11 .  tamsulosin (FLOMAX) 0.4 MG CAPS capsule, TAKE 1 CAPSULE(0.4  MG) BY MOUTH DAILY, Disp: 90 capsule, Rfl: 11 .  traMADol (ULTRAM) 50 MG tablet, TAKE 1 TABLET(50 MG) BY MOUTH EVERY 12 HOURS AS NEEDED, Disp: 60 tablet, Rfl: 1  EXAM:  VITALS per patient if applicable:  GENERAL: alert, oriented, appears well and in no acute distress  HEENT: atraumatic, conjunttiva clear, no obvious abnormalities on inspection of external nose and ears  NECK: normal movements of the head and neck  LUNGS: on inspection no signs of respiratory distress, breathing rate appears normal, no obvious gross SOB, gasping or wheezing  CV: no obvious cyanosis  MS: moves all visible extremities without noticeable abnormality  PSYCH/NEURO: pleasant and cooperative, no obvious depression or anxiety, speech and thought processing grossly intact  ASSESSMENT AND PLAN:  Discussed the following assessment and plan:  Hematuria, unspecified type, left flank pain and h/o kidney stones - Plan: Urinalysis, Routine w reflex microscopic, Urine Culture, CT Abdomen Pelvis Wo Contrast Will need to f/u Dr. Diamantina Providence   Type 2 diabetes mellitus with hyperglycemia, without long-term current use of insulin (HCC) - Plan: Semaglutide,0.25 or 0.5MG/DOS, (OZEMPIC, 0.25 OR 0.5 MG/DOSE,) 2 MG/1.5ML SOPN, Insulin Pen Needle (PEN NEEDLES) 30G X 8 MM MISC catie filled out application for med asst.   Post-nasal drip - Plan: ipratropium (ATROVENT) 0.06 % nasal spray  Allergic rhinitis, unspecified seasonality, unspecified trigger - Plan: ipratropium (ATROVENT) 0.06  % nasal spray rec claritin ns and flonase  -if continues f/u Dr. Tami Ribas     -we discussed possible serious and likely etiologies, options for evaluation and workup, limitations of telemedicine visit vs in person visit, treatment, treatment risks and precautions. Pt prefers to treat via telemedicine empirically rather then risking or undertaking an in person visit at this moment. Patient agrees to seek prompt in person care if worsening, new symptoms arise, or if is not improving with treatment.   I discussed the assessment and treatment plan with the patient. The patient was provided an opportunity to ask questions and all were answered. The patient agreed with the plan and demonstrated an understanding of the instructions.   The patient was advised to call back or seek an in-person evaluation if the symptoms worsen or if the condition fails to improve as anticipated.  Time spent 25 minutes   Delorise Jackson, MD

## 2018-10-25 LAB — URINE CULTURE

## 2018-10-26 ENCOUNTER — Encounter: Payer: Self-pay | Admitting: Internal Medicine

## 2018-10-27 ENCOUNTER — Other Ambulatory Visit: Payer: Self-pay | Admitting: *Deleted

## 2018-10-27 ENCOUNTER — Other Ambulatory Visit: Payer: Self-pay | Admitting: Internal Medicine

## 2018-10-27 DIAGNOSIS — E1165 Type 2 diabetes mellitus with hyperglycemia: Secondary | ICD-10-CM

## 2018-10-27 DIAGNOSIS — M542 Cervicalgia: Secondary | ICD-10-CM | POA: Diagnosis not present

## 2018-10-27 DIAGNOSIS — M545 Low back pain: Secondary | ICD-10-CM | POA: Diagnosis not present

## 2018-10-27 DIAGNOSIS — N200 Calculus of kidney: Secondary | ICD-10-CM

## 2018-10-27 MED ORDER — TRULICITY 0.75 MG/0.5ML ~~LOC~~ SOAJ: 0.7500 mg | SUBCUTANEOUS | 12 refills | Status: DC

## 2018-10-27 MED ORDER — TAMSULOSIN HCL 0.4 MG PO CAPS: ORAL_CAPSULE | ORAL | 3 refills | Status: DC

## 2018-10-28 ENCOUNTER — Telehealth: Payer: Self-pay

## 2018-10-28 ENCOUNTER — Encounter: Payer: Self-pay | Admitting: Internal Medicine

## 2018-10-28 NOTE — Telephone Encounter (Signed)
Copied from [place] [id number]. Topic: General - Other >> Oct 28, 2018 10:40 AM Lennox Solders wrote: Reason for CRM: pt is calling and has sent dr Patton Salles message. Pt would like catherine to return his call concerning trulicity patient assistant

## 2018-10-29 ENCOUNTER — Ambulatory Visit: Payer: Self-pay | Admitting: Pharmacist

## 2018-10-29 DIAGNOSIS — M542 Cervicalgia: Secondary | ICD-10-CM | POA: Diagnosis not present

## 2018-10-29 DIAGNOSIS — E1165 Type 2 diabetes mellitus with hyperglycemia: Secondary | ICD-10-CM

## 2018-10-29 DIAGNOSIS — R319 Hematuria, unspecified: Secondary | ICD-10-CM | POA: Diagnosis not present

## 2018-10-29 DIAGNOSIS — M545 Low back pain: Secondary | ICD-10-CM | POA: Diagnosis not present

## 2018-10-29 NOTE — Patient Instructions (Signed)
Visit Information  Goals Addressed            This Visit's Progress     Patient Stated   . "I want to work on my blood sugars" (pt-stated)       Current Barriers:  . Diabetes: uncontrolled as evidenced by BG; most recent A1c 8.2%  o Collaboratively decided to apply for Ozempic patient assistance, d/t superior glycemic/weight benefit than Trulicity . Current antihyperglycemic regimen: glipizide XL 5 mg BID o Has been unable to tolerate metformin, IR and ER, d/t diarrhea . Denies any issues w/ hypoglycemia . Endorses polyuria over the past few months; though also endorses BPH o Currently working w/ urology regarding kidney stones  . Current exercise: physical therapy for lumbar area - has NOT received steroid injections   o Walking daily as well . Current blood glucose readings: Fasting ~180s; 1 hour before supper ~170s o Lowest 154, highest to 258 . Cardiovascular risk reduction: o Current hypertensive regimen: HCTZ 25 mg QAM, lisinopril 10 mg QAM o Current hyperlipidemia regimen: rosuvastatin 10 mg daily, ezetimibe 10 mg daily   Pharmacist Clinical Goal(s):  [name] Over the next 90 days, patient will work with PharmD and primary care provider to address optimized medication management  Interventions: . Received patient and provider portions of [employer] application for Ozempic, as well as patient 2019 tax return. Faxed to [employer]. Will pass materials along to [employer], CPhT for follow up with company.  Patient Self Care Activities:  . Patient will check blood glucose BID , document, and provide at future appointments . Patient will take medications as prescribed . Patient will report any questions or concerns to provider   Please see past updates related to this goal by clicking on the "Past Updates" button in the selected goal         The patient verbalized understanding of instructions provided today and declined a print copy of patient instruction materials.    Plan:  - Will collaborate with Susy Frizzle, CPhT on medication access as above  [name], PharmD, [place] Pharmacist [place] [phone]

## 2018-10-29 NOTE — Chronic Care Management (AMB) (Signed)
Chronic Care Management   Follow Up Note   10/29/2018 Name: [name] [name] MRN: [id number] DOB: [birth year]  Referred by: [name], [name]; but currently being managed by Dr. Terese Door, as PCP is on leave. Reason for referral : Chronic Care Management (Medication Management)   [name] [name] is a 78 y.o. year old male who is a primary care patient of [name], [name]. The CCM team was consulted for assistance with chronic disease management and care coordination needs.    Care coordination completed today.   Review of patient status, including review of consultants reports, relevant laboratory and other test results, and collaboration with appropriate care team members and the patient's provider was performed as part of comprehensive patient evaluation and provision of chronic care management services.    SDOH (Social Determinants of Health) screening performed today: Financial Strain . See Care Plan for related entries.   Advanced Directives Status: N See Care Plan and Vynca application for related entries.  Outpatient Encounter Medications as of 10/29/2018  Medication Sig Note   acetaminophen (TYLENOL) 650 MG CR tablet Take 650 mg by mouth 2 (two) times a day.    aspirin EC 81 MG tablet Take 81 mg by mouth daily.     blood glucose meter kit and supplies KIT Dispense based on patient and insurance preference. Use up to four times daily as directed. (FOR ICD-9 250.00, 250.01).    carvedilol (COREG) 3.125 MG tablet TAKE 1 TABLET(3.125 MG) BY MOUTH TWICE DAILY    ezetimibe (ZETIA) 10 MG tablet Take 1 tablet (10 mg total) by mouth daily.    fluticasone (FLONASE) 50 MCG/ACT nasal spray Place 2 sprays into both nostrils daily. (Patient taking differently: Place 2 sprays into both nostrils daily as needed for allergies. )    glipiZIDE (GLUCOTROL XL) 5 MG 24 hr tablet Take 1 tablet (5 mg total) by mouth 2 (two) times daily. With food    glucose blood (CONTOUR  TEST) test strip 1 each by Other route 2 (two) times daily. Use as instructed    glucose blood test strip Use as instructed to check blood sugar up to three times daily. E11.42    hydrochlorothiazide (HYDRODIURIL) 25 MG tablet TAKE 1 TABLET(25 MG) BY MOUTH DAILY    Insulin Pen Needle (PEN NEEDLES) 30G X 8 MM MISC 1 Device by Does not apply route once a week.    ipratropium (ATROVENT) 0.06 % nasal spray Place 2 sprays into both nostrils 3 (three) times daily.    Lancets MISC 1 Device by Does not apply route 2 (two) times daily.    lisinopril (ZESTRIL) 10 MG tablet TAKE 1 TABLET BY MOUTH DAILY    Multiple Vitamin (MULTIVITAMIN) capsule Take 1 capsule by mouth daily with lunch.     naproxen sodium (ALEVE) 220 MG tablet Take 220 mg by mouth 2 (two) times daily as needed (for pain or headache).    Omega-3 Fatty Acids (FISH OIL PO) Take 1 capsule by mouth daily.     pantoprazole (PROTONIX) 40 MG tablet Take 1 tablet (40 mg total) by mouth 2 (two) times daily. **PLEASE SCHEDULE FOLLOW UP APPT**    Polyvinyl Alcohol-Povidone (MURINE TEARS FOR DRY EYES OP) Place 1-2 drops into both eyes daily as needed (for dry eyes).    Potassium 99 MG TABS Take 1 tablet by mouth daily. 10/22/2018: QOD    psyllium (METAMUCIL) 58.6 % powder Take 1 packet by mouth at bedtime.  07/13/2018: PRN   rosuvastatin (CRESTOR)  10 MG tablet Take 1 tablet (10 mg total) by mouth daily.    Semaglutide,0.25 or 0.5MG/DOS, (OZEMPIC, 0.25 OR 0.5 MG/DOSE,) 2 MG/1.5ML SOPN Inject 0.5 mg into the skin once a week.    tamsulosin (FLOMAX) 0.4 MG CAPS capsule TAKE 1 CAPSULE(0.4 MG) BY MOUTH DAILY    traMADol (ULTRAM) 50 MG tablet TAKE 1 TABLET(50 MG) BY MOUTH EVERY 12 HOURS AS NEEDED    [DISCONTINUED] Dulaglutide (TRULICITY) 0.10 UV/2.5DG SOPN Inject 0.75 mg into the skin once a week.    No facility-administered encounter medications on file as of 10/29/2018.      Goals Addressed            This Visit's Progress      Patient Stated    "I want to work on my blood sugars" (pt-stated)       Current Barriers:   Diabetes: uncontrolled as evidenced by BG; most recent A1c 8.2%  o Collaboratively decided to apply for Ozempic patient assistance, d/t superior glycemic/weight benefit than Trulicity  Current antihyperglycemic regimen: glipizide XL 5 mg BID o Has been unable to tolerate metformin, IR and ER, d/t diarrhea  Denies any issues w/ hypoglycemia  Endorses polyuria over the past few months; though also endorses BPH o Currently working w/ urology regarding kidney stones   Current exercise: physical therapy for lumbar area - has NOT received steroid injections   o Walking daily as well  Current blood glucose readings: Fasting ~180s; 1 hour before supper ~170s o Lowest 154, highest to 258  Cardiovascular risk reduction: o Current hypertensive regimen: HCTZ 25 mg QAM, lisinopril 10 mg QAM o Current hyperlipidemia regimen: rosuvastatin 10 mg daily, ezetimibe 10 mg daily   Pharmacist Clinical Goal(s):   Over the next 90 days, patient will work with PharmD and primary care provider to address optimized medication management  Interventions:  Received patient and provider portions of [employer] application for [place], as well as patient 2019 tax return. Faxed to [employer]. Will pass materials along to [employer], CPhT for follow up with company.  Patient Self Care Activities:   Patient will check blood glucose BID , document, and provide at future appointments  Patient will take medications as prescribed  Patient will report any questions or concerns to provider   Please see past updates related to this goal by clicking on the "Past Updates" button in the selected goal         Plan:  - Will collaborate with Susy Frizzle, CPhT on medication access as above  [name], PharmD, [place] Pharmacist [place] [phone]

## 2018-10-30 ENCOUNTER — Telehealth: Payer: Self-pay | Admitting: Family Medicine

## 2018-10-30 LAB — URINALYSIS, ROUTINE W REFLEX MICROSCOPIC
Bilirubin, UA: NEGATIVE
Glucose, UA: NEGATIVE
Ketones, UA: NEGATIVE
Nitrite, UA: NEGATIVE
Specific Gravity, UA: 1.016 (ref 1.005–1.030)
Urobilinogen, Ur: 1 mg/dL (ref 0.2–1.0)
pH, UA: 6.5 (ref 5.0–7.5)

## 2018-10-30 LAB — MICROSCOPIC EXAMINATION
Casts: NONE SEEN /lpf
RBC, Urine: >30 /hpf — AB (ref 0–2)

## 2018-10-30 NOTE — Telephone Encounter (Signed)
Please let patient know his nerve conduction study does show  + bilateral carpal tunnel syndrome  WE can refer to hand surgery for evaluation if he agrees  Thanks!  LG

## 2018-11-02 NOTE — Telephone Encounter (Signed)
I spoke with patient & as of now he did not want a referral. His wife was diagnosed with COVID this weekend & he was concerned for her. He was also going to be tested himself. He will let us know when/if he is ready for referral to hand surgeon.

## 2018-11-03 ENCOUNTER — Other Ambulatory Visit: Payer: Self-pay | Admitting: Cardiovascular Disease

## 2018-11-03 ENCOUNTER — Other Ambulatory Visit: Payer: Self-pay | Admitting: Pharmacy Technician

## 2018-11-03 ENCOUNTER — Encounter: Payer: Self-pay | Admitting: Internal Medicine

## 2018-11-03 NOTE — Patient Outreach (Signed)
[facility]) Care Management  11/03/2018  [name] [name] 01/07/1941 [id number]    Care coordination call placed to [place] to check on status of Ozempic application that [place] submitted on 10/29/2018.  Spoke to [place] who informed that they need the provider or representative to re submit the application with the expiration date of the provider's state license number. They also want to confirm the patient's state license number.  Re submitted application with updated information.  Will followup with Novo Nordisk in 3-5 business days to inquire about application status.  [name] P. Trinty Marken, [place] Management [phone]

## 2018-11-05 ENCOUNTER — Other Ambulatory Visit: Payer: Self-pay | Admitting: Pharmacy Technician

## 2018-11-05 NOTE — Patient Outreach (Signed)
[place] [facility]) Care Management  11/05/2018  Egon Divelbiss Jun 11, 1940 [id number]    Care coordination call placed to [place] in regards to patient's application for Ozempic.  Spoke to [place] who informed patient had been APPROVED 11/05/2018-01/14/2019. She informed patient would receive a 90 days supply of medication and that it should be delivered to the provider's office in the next 10-14 business days.  Will followup with patient in 10-14 business days to confirm receipt of medication.  Jenean Escandon P. Brenyn Petrey, [place] Management [phone]

## 2018-11-06 ENCOUNTER — Other Ambulatory Visit: Payer: Self-pay

## 2018-11-06 MED ORDER — HYDROCHLOROTHIAZIDE 25 MG PO TABS: ORAL_TABLET | ORAL | 0 refills | Status: DC

## 2018-11-09 ENCOUNTER — Other Ambulatory Visit: Payer: Self-pay | Admitting: Gastroenterology

## 2018-11-09 ENCOUNTER — Ambulatory Visit: Payer: Self-pay | Admitting: Pharmacist

## 2018-11-09 DIAGNOSIS — E1165 Type 2 diabetes mellitus with hyperglycemia: Secondary | ICD-10-CM | POA: Diagnosis not present

## 2018-11-09 NOTE — Patient Instructions (Signed)
Visit Information  Goals Addressed            This Visit's Progress     Patient Stated   . "I want to work on my blood sugars" (pt-stated)       Current Barriers:  . Diabetes: uncontrolled as evidenced by BG; most recent A1c 8.2%  o Collaboratively decided to apply for Ozempic patient assistance, d/t superior glycemic/weight benefit than Trulicity o Received fax from [employer] - patient has been APPROVED for [employer] assistance through 12/14/2018. A four month supply should be arriving to the office within 10-14 business days from the date of approval, 11/05/2018 . Current antihyperglycemic regimen: glipizide XL 5 mg BID o Has been unable to tolerate metformin, IR and ER, d/t diarrhea . Denies any issues w/ hypoglycemia . Endorses polyuria over the past few months; though also endorses BPH o Currently working w/ urology regarding kidney stones  . Current exercise: physical therapy for lumbar area - has NOT received steroid injections   o Walking daily as well . Current blood glucose readings: Fasting ~180s; 1 hour before supper ~170s o Lowest 154, highest to 258 . Cardiovascular risk reduction: o Current hypertensive regimen: HCTZ 25 mg QAM, lisinopril 10 mg QAM o Current hyperlipidemia regimen: rosuvastatin 10 mg daily, ezetimibe 10 mg daily   Pharmacist Clinical Goal(s):  [name] Over the next 90 days, patient will work with PharmD and primary care provider to address optimized medication management  Interventions: . Contacted patient to inform of the above, I spoke with his wife and informed of the above. Unfortunately, patient was exposed to [place], though had a negative COVID test on Saturday. Patient cannot enter the office for 21 days from negative COVID test.  . Attempted to contact [facility] to see if they could ship to patient's address given the extenuating circumstance; however, I had to leave my number in the call back queue and have not hear back by end of business today.  Will follow up with Novo later this week.   Patient Self Care Activities:  . Patient will check blood glucose BID , document, and provide at future appointments . Patient will take medications as prescribed . Patient will report any questions or concerns to provider   Please see past updates related to this goal by clicking on the "Past Updates" button in the selected goal         The patient verbalized understanding of instructions provided today and declined a print copy of patient instruction materials.   Plan:  - Will collaborate with Susy Frizzle, CPhT and clinic staff on medication acquisition plan.   [name], PharmD, [place] Pharmacist [facility] [phone]

## 2018-11-09 NOTE — Chronic Care Management (AMB) (Signed)
Chronic Care Management   Follow Up Note   11/09/2018 Name: [name] [name] MRN: [id number] DOB: 10/06/1940  Referred by: [name], [name] Reason for referral : Chronic Care Management (Medication Management)   [name] [name] is a 78 y.o. year old male who is a primary care patient of [name], [name]. The CCM team was consulted for assistance with chronic disease management and care coordination needs.    Care coordination completed today.   Review of patient status, including review of consultants reports, relevant laboratory and other test results, and collaboration with appropriate care team members and the patient's provider was performed as part of comprehensive patient evaluation and provision of chronic care management services.    SDOH (Social Determinants of Health) screening performed today: Financial Strain . See Care Plan for related entries.   Advanced Directives Status: N See Care Plan and Vynca application for related entries.  Outpatient Encounter Medications as of 11/09/2018  Medication Sig Note  . acetaminophen (TYLENOL) 650 MG CR tablet Take 650 mg by mouth 2 (two) times a day.   [name] aspirin EC 81 MG tablet Take 81 mg by mouth daily.    . blood glucose meter kit and supplies KIT Dispense based on patient and insurance preference. Use up to four times daily as directed. (FOR ICD-9 250.00, 250.01).   . carvedilol (COREG) 3.125 MG tablet TAKE 1 TABLET(3.125 MG) BY MOUTH TWICE DAILY   . ezetimibe (ZETIA) 10 MG tablet Take 1 tablet (10 mg total) by mouth daily.   . fluticasone (FLONASE) 50 MCG/ACT nasal spray Place 2 sprays into both nostrils daily. (Patient taking differently: Place 2 sprays into both nostrils daily as needed for allergies. )   . glipiZIDE (GLUCOTROL XL) 5 MG 24 hr tablet Take 1 tablet (5 mg total) by mouth 2 (two) times daily. With food   . glucose blood (CONTOUR TEST) test strip 1 each by Other route 2 (two) times daily. Use as  instructed   . glucose blood test strip Use as instructed to check blood sugar up to three times daily. E11.42   . hydrochlorothiazide (HYDRODIURIL) 25 MG tablet TAKE 1 TABLET(25 MG) BY MOUTH DAILY   . Insulin Pen Needle (PEN NEEDLES) 30G X 8 MM MISC 1 Device by Does not apply route once a week.   [name] ipratropium (ATROVENT) 0.06 % nasal spray Place 2 sprays into both nostrils 3 (three) times daily.   . Lancets MISC 1 Device by Does not apply route 2 (two) times daily.   [name] lisinopril (ZESTRIL) 10 MG tablet TAKE 1 TABLET BY MOUTH DAILY   . Multiple Vitamin (MULTIVITAMIN) capsule Take 1 capsule by mouth daily with lunch.    . naproxen sodium (ALEVE) 220 MG tablet Take 220 mg by mouth 2 (two) times daily as needed (for pain or headache).   . Omega-3 Fatty Acids (FISH OIL PO) Take 1 capsule by mouth daily.    . pantoprazole (PROTONIX) 40 MG tablet Take 1 tablet (40 mg total) by mouth 2 (two) times daily. **PLEASE SCHEDULE FOLLOW UP APPT**   . Polyvinyl Alcohol-Povidone (MURINE TEARS FOR DRY EYES OP) Place 1-2 drops into both eyes daily as needed (for dry eyes).   . Potassium 99 MG TABS Take 1 tablet by mouth daily. 10/22/2018: QOD   . psyllium (METAMUCIL) 58.6 % powder Take 1 packet by mouth at bedtime.  07/13/2018: PRN  . rosuvastatin (CRESTOR) 10 MG tablet Take 1 tablet (10 mg total) by mouth daily.   [name]  Semaglutide,0.25 or 0.'5MG'$ /DOS, (OZEMPIC, 0.25 OR 0.5 MG/DOSE,) 2 MG/1.5ML SOPN Inject 0.5 mg into the skin once a week.   . tamsulosin (FLOMAX) 0.4 MG CAPS capsule TAKE 1 CAPSULE(0.4 MG) BY MOUTH DAILY   . traMADol (ULTRAM) 50 MG tablet TAKE 1 TABLET(50 MG) BY MOUTH EVERY 12 HOURS AS NEEDED    No facility-administered encounter medications on file as of 11/09/2018.      Goals Addressed            This Visit's Progress     Patient Stated   . "I want to work on my blood sugars" (pt-stated)       Current Barriers:  . Diabetes: uncontrolled as evidenced by BG; most recent A1c 8.2%  o  Collaboratively decided to apply for Ozempic patient assistance, d/t superior glycemic/weight benefit than Trulicity o Received fax from [employer] - patient has been APPROVED for [employer] assistance through 12/14/2018. A four month supply should be arriving to the office within 10-14 business days from the date of approval, 11/05/2018 . Current antihyperglycemic regimen: glipizide XL 5 mg BID o Has been unable to tolerate metformin, IR and ER, d/t diarrhea . Denies any issues w/ hypoglycemia . Endorses polyuria over the past few months; though also endorses BPH o Currently working w/ urology regarding kidney stones  . Current exercise: physical therapy for lumbar area - has NOT received steroid injections   o Walking daily as well . Current blood glucose readings: Fasting ~180s; 1 hour before supper ~170s o Lowest 154, highest to 258 . Cardiovascular risk reduction: o Current hypertensive regimen: HCTZ 25 mg QAM, lisinopril 10 mg QAM o Current hyperlipidemia regimen: rosuvastatin 10 mg daily, ezetimibe 10 mg daily   Pharmacist Clinical Goal(s):  [name] Over the next 90 days, patient will work with PharmD and primary care provider to address optimized medication management  Interventions: . Contacted patient to inform of the above, I spoke with his wife and informed of the above. Unfortunately, patient was exposed to [place], though had a negative COVID test on Saturday. Patient cannot enter the office for 21 days from negative COVID test.  . Attempted to contact [facility] to see if they could ship to patient's address given the extenuating circumstance; however, I had to leave my number in the call back queue and have not hear back by end of business today. Will follow up with Novo later this week.   Patient Self Care Activities:  . Patient will check blood glucose BID , document, and provide at future appointments . Patient will take medications as prescribed . Patient will report any  questions or concerns to provider   Please see past updates related to this goal by clicking on the "Past Updates" button in the selected goal          Plan:  - Will collaborate with Susy Frizzle, CPhT and clinic staff on medication acquisition plan.   [name], PharmD, [place] Pharmacist [address] [facility] [place] [phone]

## 2018-11-10 ENCOUNTER — Ambulatory Visit: Payer: Self-pay | Admitting: Pharmacist

## 2018-11-10 ENCOUNTER — Ambulatory Visit: Payer: Medicare Other

## 2018-11-10 DIAGNOSIS — E1165 Type 2 diabetes mellitus with hyperglycemia: Secondary | ICD-10-CM

## 2018-11-10 NOTE — Patient Instructions (Signed)
Visit Information  Goals Addressed            This Visit's Progress     Patient Stated   . "I want to work on my blood sugars" (pt-stated)       Current Barriers:  . Diabetes: uncontrolled as evidenced by BG; most recent A1c 8.2%  o Collaboratively decided to apply for Ozempic patient assistance, d/t superior glycemic/weight benefit than Trulicity o Received fax from [employer] - patient has been APPROVED for [employer] assistance through 12/14/2018. A four month supply should be arriving to the office within 10-14 business days from the date of approval, 11/05/2018 o D/t COVID exposure, patient and wife will be unable to come into the office in the next ~month to pick up medication . Current antihyperglycemic regimen: glipizide XL 5 mg BID o Has been unable to tolerate metformin, IR and ER, d/t diarrhea . Denies any issues w/ hypoglycemia . Endorses polyuria over the past few months; though also endorses BPH o Currently working w/ urology regarding kidney stones  . Current exercise: physical therapy for lumbar area - has NOT received steroid injections   o Walking daily as well . Current blood glucose readings: Fasting ~180s; 1 hour before supper ~170s o Lowest 154, highest to 258 . Cardiovascular risk reduction: o Current hypertensive regimen: HCTZ 25 mg QAM, lisinopril 10 mg QAM o Current hyperlipidemia regimen: rosuvastatin 10 mg daily, ezetimibe 10 mg daily   Pharmacist Clinical Goal(s):  [name] Over the next 90 days, patient will work with PharmD and primary care provider to address optimized medication management  Interventions: . [employer], requested that Ozempic be shipped to the patient given COVID quarantine recommendations. She will pass this request along to her supervisor. I gave her my call back information to let me know the result.   Patient Self Care Activities:  . Patient will check blood glucose BID , document, and provide at future  appointments . Patient will take medications as prescribed . Patient will report any questions or concerns to provider   Please see past updates related to this goal by clicking on the "Past Updates" button in the selected goal         The patient verbalized understanding of instructions provided today and declined a print copy of patient instruction materials.   Plan:  - Will await call from [employer] regarding if product can be shipped directly to patient  [name], PharmD, [place] Pharmacist [facility] [phone]

## 2018-11-10 NOTE — Chronic Care Management (AMB) (Signed)
Chronic Care Management   Follow Up Note   11/10/2018 Name: [name] [name] MRN: [id number] DOB: April 21, 1940  Referred by: [name], [name] - though working with Dr. Terese Door, as PCP is off on leave Reason for referral : Chronic Care Management (Medication Management)   [name] [name] is a 78 y.o. year old male who is a primary care patient of [name], [name]. The CCM team was consulted for assistance with chronic disease management and care coordination needs.    Care coordination completed today.  Review of patient status, including review of consultants reports, relevant laboratory and other test results, and collaboration with appropriate care team members and the patient's provider was performed as part of comprehensive patient evaluation and provision of chronic care management services.    SDOH (Social Determinants of Health) screening performed today: Financial Strain . See Care Plan for related entries.   Advanced Directives Status: N See Care Plan and Vynca application for related entries.  Outpatient Encounter Medications as of 11/10/2018  Medication Sig Note  . acetaminophen (TYLENOL) 650 MG CR tablet Take 650 mg by mouth 2 (two) times a day.   [name] aspirin EC 81 MG tablet Take 81 mg by mouth daily.    . blood glucose meter kit and supplies KIT Dispense based on patient and insurance preference. Use up to four times daily as directed. (FOR ICD-9 250.00, 250.01).   . carvedilol (COREG) 3.125 MG tablet TAKE 1 TABLET(3.125 MG) BY MOUTH TWICE DAILY   . ezetimibe (ZETIA) 10 MG tablet Take 1 tablet (10 mg total) by mouth daily.   . fluticasone (FLONASE) 50 MCG/ACT nasal spray Place 2 sprays into both nostrils daily. (Patient taking differently: Place 2 sprays into both nostrils daily as needed for allergies. )   . glipiZIDE (GLUCOTROL XL) 5 MG 24 hr tablet Take 1 tablet (5 mg total) by mouth 2 (two) times daily. With food   . glucose blood (CONTOUR TEST)  test strip 1 each by Other route 2 (two) times daily. Use as instructed   . glucose blood test strip Use as instructed to check blood sugar up to three times daily. E11.42   . hydrochlorothiazide (HYDRODIURIL) 25 MG tablet TAKE 1 TABLET(25 MG) BY MOUTH DAILY   . Insulin Pen Needle (PEN NEEDLES) 30G X 8 MM MISC 1 Device by Does not apply route once a week.   [name] ipratropium (ATROVENT) 0.06 % nasal spray Place 2 sprays into both nostrils 3 (three) times daily.   . Lancets MISC 1 Device by Does not apply route 2 (two) times daily.   [name] lisinopril (ZESTRIL) 10 MG tablet TAKE 1 TABLET BY MOUTH DAILY   . Multiple Vitamin (MULTIVITAMIN) capsule Take 1 capsule by mouth daily with lunch.    . naproxen sodium (ALEVE) 220 MG tablet Take 220 mg by mouth 2 (two) times daily as needed (for pain or headache).   . Omega-3 Fatty Acids (FISH OIL PO) Take 1 capsule by mouth daily.    . pantoprazole (PROTONIX) 40 MG tablet Take 1 tablet (40 mg total) by mouth 2 (two) times daily. **PLEASE SCHEDULE FOLLOW UP APPT**   . Polyvinyl Alcohol-Povidone (MURINE TEARS FOR DRY EYES OP) Place 1-2 drops into both eyes daily as needed (for dry eyes).   . Potassium 99 MG TABS Take 1 tablet by mouth daily. 10/22/2018: QOD   . psyllium (METAMUCIL) 58.6 % powder Take 1 packet by mouth at bedtime.  07/13/2018: PRN  . rosuvastatin (CRESTOR) 10  MG tablet Take 1 tablet (10 mg total) by mouth daily.   . Semaglutide,0.25 or 0.5MG/DOS, (OZEMPIC, 0.25 OR 0.5 MG/DOSE,) 2 MG/1.5ML SOPN Inject 0.5 mg into the skin once a week.   . tamsulosin (FLOMAX) 0.4 MG CAPS capsule TAKE 1 CAPSULE(0.4 MG) BY MOUTH DAILY   . traMADol (ULTRAM) 50 MG tablet TAKE 1 TABLET(50 MG) BY MOUTH EVERY 12 HOURS AS NEEDED    No facility-administered encounter medications on file as of 11/10/2018.      Goals Addressed            This Visit's Progress     Patient Stated   . "I want to work on my blood sugars" (pt-stated)       Current Barriers:  . Diabetes:  uncontrolled as evidenced by BG; most recent A1c 8.2%  o Collaboratively decided to apply for Ozempic patient assistance, d/t superior glycemic/weight benefit than Trulicity o Received fax from [employer] - patient has been APPROVED for [employer] assistance through 12/14/2018. A four month supply should be arriving to the office within 10-14 business days from the date of approval, 11/05/2018 o D/t COVID exposure, patient and wife will be unable to come into the office in the next ~month to pick up medication . Current antihyperglycemic regimen: glipizide XL 5 mg BID o Has been unable to tolerate metformin, IR and ER, d/t diarrhea . Denies any issues w/ hypoglycemia . Endorses polyuria over the past few months; though also endorses BPH o Currently working w/ urology regarding kidney stones  . Current exercise: physical therapy for lumbar area - has NOT received steroid injections   o Walking daily as well . Current blood glucose readings: Fasting ~180s; 1 hour before supper ~170s o Lowest 154, highest to 258 . Cardiovascular risk reduction: o Current hypertensive regimen: HCTZ 25 mg QAM, lisinopril 10 mg QAM o Current hyperlipidemia regimen: rosuvastatin 10 mg daily, ezetimibe 10 mg daily   Pharmacist Clinical Goal(s):  [name] Over the next 90 days, patient will work with PharmD and primary care provider to address optimized medication management  Interventions: . [employer], requested that Ozempic be shipped to the patient given COVID quarantine recommendations. She will pass this request along to her supervisor. I gave her my call back information to let me know the result.   Patient Self Care Activities:  . Patient will check blood glucose BID , document, and provide at future appointments . Patient will take medications as prescribed . Patient will report any questions or concerns to provider   Please see past updates related to this goal by clicking on the "Past Updates"  button in the selected goal          Plan:  - Will await call from [employer] regarding if product can be shipped directly to patient  [name], PharmD, [place] Pharmacist [facility] [phone]

## 2018-11-11 ENCOUNTER — Ambulatory Visit: Payer: Self-pay | Admitting: Pharmacist

## 2018-11-11 DIAGNOSIS — E1165 Type 2 diabetes mellitus with hyperglycemia: Secondary | ICD-10-CM

## 2018-11-11 NOTE — Chronic Care Management (AMB) (Signed)
Chronic Care Management   Follow Up Note   11/11/2018 Name: Arlo Butt MRN: [id number] DOB: 05/10/1940  Referred by: [name], [name] Reason for referral : Chronic Care Management (Medication Management)   Huzaifa Viney is a 78 y.o. year old male who is a primary care patient of [name], [name]. The CCM team was consulted for assistance with chronic disease management and care coordination needs.    Care coordination completed today.   Review of patient status, including review of consultants reports, relevant laboratory and other test results, and collaboration with appropriate care team members and the patient's provider was performed as part of comprehensive patient evaluation and provision of chronic care management services.    SDOH (Social Determinants of Health) screening performed today: Financial Strain . See Care Plan for related entries.   Advanced Directives Status: N See Care Plan and Vynca application for related entries.  Outpatient Encounter Medications as of 11/11/2018  Medication Sig Note  . acetaminophen (TYLENOL) 650 MG CR tablet Take 650 mg by mouth 2 (two) times a day.   [name] aspirin EC 81 MG tablet Take 81 mg by mouth daily.    . blood glucose meter kit and supplies KIT Dispense based on patient and insurance preference. Use up to four times daily as directed. (FOR ICD-9 250.00, 250.01).   . carvedilol (COREG) 3.125 MG tablet TAKE 1 TABLET(3.125 MG) BY MOUTH TWICE DAILY   . ezetimibe (ZETIA) 10 MG tablet Take 1 tablet (10 mg total) by mouth daily.   . fluticasone (FLONASE) 50 MCG/ACT nasal spray Place 2 sprays into both nostrils daily. (Patient taking differently: Place 2 sprays into both nostrils daily as needed for allergies. )   . glipiZIDE (GLUCOTROL XL) 5 MG 24 hr tablet Take 1 tablet (5 mg total) by mouth 2 (two) times daily. With food   . glucose blood (CONTOUR TEST) test strip 1 each by Other route 2 (two) times daily. Use as  instructed   . glucose blood test strip Use as instructed to check blood sugar up to three times daily. E11.42   . hydrochlorothiazide (HYDRODIURIL) 25 MG tablet TAKE 1 TABLET(25 MG) BY MOUTH DAILY   . Insulin Pen Needle (PEN NEEDLES) 30G X 8 MM MISC 1 Device by Does not apply route once a week.   [name] ipratropium (ATROVENT) 0.06 % nasal spray Place 2 sprays into both nostrils 3 (three) times daily.   . Lancets MISC 1 Device by Does not apply route 2 (two) times daily.   [name] lisinopril (ZESTRIL) 10 MG tablet TAKE 1 TABLET BY MOUTH DAILY   . Multiple Vitamin (MULTIVITAMIN) capsule Take 1 capsule by mouth daily with lunch.    . naproxen sodium (ALEVE) 220 MG tablet Take 220 mg by mouth 2 (two) times daily as needed (for pain or headache).   . Omega-3 Fatty Acids (FISH OIL PO) Take 1 capsule by mouth daily.    . pantoprazole (PROTONIX) 40 MG tablet Take 1 tablet (40 mg total) by mouth 2 (two) times daily. **PLEASE SCHEDULE FOLLOW UP APPT**   . Polyvinyl Alcohol-Povidone (MURINE TEARS FOR DRY EYES OP) Place 1-2 drops into both eyes daily as needed (for dry eyes).   . Potassium 99 MG TABS Take 1 tablet by mouth daily. 10/22/2018: QOD   . psyllium (METAMUCIL) 58.6 % powder Take 1 packet by mouth at bedtime.  07/13/2018: PRN  . rosuvastatin (CRESTOR) 10 MG tablet Take 1 tablet (10 mg total) by mouth daily.   [name]  Semaglutide,0.25 or 0.5MG/DOS, (OZEMPIC, 0.25 OR 0.5 MG/DOSE,) 2 MG/1.5ML SOPN Inject 0.5 mg into the skin once a week.   . tamsulosin (FLOMAX) 0.4 MG CAPS capsule TAKE 1 CAPSULE(0.4 MG) BY MOUTH DAILY   . traMADol (ULTRAM) 50 MG tablet TAKE 1 TABLET(50 MG) BY MOUTH EVERY 12 HOURS AS NEEDED    No facility-administered encounter medications on file as of 11/11/2018.      Goals Addressed            This Visit's Progress     Patient Stated   . "I want to work on my blood sugars" (pt-stated)       Current Barriers:  . Diabetes: uncontrolled as evidenced by BG; most recent A1c 8.2%  o Received  fax from [employer] - patient has been APPROVED for [employer] assistance through 12/14/2018. A four month supply should be arriving to the office within 10-14 business days from the date of approval, 11/05/2018 o D/t COVID exposure, patient and wife will be unable to come into the office in the next ~month to pick up medication. [place] to see if they could make an exception and ship medication to his home instead . Current antihyperglycemic regimen: glipizide XL 5 mg BID o Has been unable to tolerate metformin, IR and ER, d/t diarrhea . Denies any issues w/ hypoglycemia . Cardiovascular risk reduction: o Current hypertensive regimen: HCTZ 25 mg QAM, lisinopril 10 mg QAM o Current hyperlipidemia regimen: rosuvastatin 10 mg daily, ezetimibe 10 mg daily   Pharmacist Clinical Goal(s):  [name] Over the next 90 days, patient will work with PharmD and primary care provider to address optimized medication management  Interventions: . Received call back from [employer]. Unfortunately, they cannot grant an exception to ship to the patient's home directly. Will collaborate with Kerin Salen, RN, regarding procedure for getting medication to this patient.   Patient Self Care Activities:  . Patient will check blood glucose BID , document, and provide at future appointments . Patient will take medications as prescribed . Patient will report any questions or concerns to provider   Please see past updates related to this goal by clicking on the "Past Updates" button in the selected goal          Plan: - Will collaborate with Kerin Salen, RN on best process for medication pick up  [name], PharmD, [place] Pharmacist [place] [phone]

## 2018-11-11 NOTE — Patient Instructions (Signed)
Visit Information  Goals Addressed            This Visit's Progress     Patient Stated   . "I want to work on my blood sugars" (pt-stated)       Current Barriers:  . Diabetes: uncontrolled as evidenced by BG; most recent A1c 8.2%  o Received fax from [employer] - patient has been APPROVED for [employer] assistance through 12/14/2018. A four month supply should be arriving to the office within 10-14 business days from the date of approval, 11/05/2018 o D/t COVID exposure, patient and wife will be unable to come into the office in the next ~month to pick up medication. [place] to see if they could make an exception and ship medication to his home instead . Current antihyperglycemic regimen: glipizide XL 5 mg BID o Has been unable to tolerate metformin, IR and ER, d/t diarrhea . Denies any issues w/ hypoglycemia . Cardiovascular risk reduction: o Current hypertensive regimen: HCTZ 25 mg QAM, lisinopril 10 mg QAM o Current hyperlipidemia regimen: rosuvastatin 10 mg daily, ezetimibe 10 mg daily   Pharmacist Clinical Goal(s):  [name] Over the next 90 days, patient will work with PharmD and primary care provider to address optimized medication management  Interventions: . Received call back from [employer]. Unfortunately, they cannot grant an exception to ship to the patient's home directly. Will collaborate with Kerin Salen, RN, regarding procedure for getting medication to this patient.   Patient Self Care Activities:  . Patient will check blood glucose BID , document, and provide at future appointments . Patient will take medications as prescribed . Patient will report any questions or concerns to provider   Please see past updates related to this goal by clicking on the "Past Updates" button in the selected goal         The patient verbalized understanding of instructions provided today and declined a print copy of patient instruction materials.   Plan: -  Will collaborate with Kerin Salen, RN on best process for medication pick up  [name], PharmD, [place] Pharmacist [place] [phone]

## 2018-11-18 ENCOUNTER — Telehealth: Payer: Self-pay

## 2018-11-18 NOTE — Telephone Encounter (Signed)
catie is he calling for you ?

## 2018-11-18 NOTE — Telephone Encounter (Signed)
Copied from [place] [id number]. Topic: General - Other >> Nov 18, 2018 11:27 AM Carolyn Stare wrote: Pt ask if Manuela Schwartz will call him when she is back in the office

## 2018-11-19 ENCOUNTER — Ambulatory Visit (INDEPENDENT_AMBULATORY_CARE_PROVIDER_SITE_OTHER): Payer: Medicare Other | Admitting: Pharmacist

## 2018-11-19 DIAGNOSIS — R319 Hematuria, unspecified: Secondary | ICD-10-CM

## 2018-11-19 DIAGNOSIS — E1165 Type 2 diabetes mellitus with hyperglycemia: Secondary | ICD-10-CM

## 2018-11-19 NOTE — Patient Instructions (Signed)
Visit Information  Goals Addressed            This Visit's Progress     Patient Stated   . "I want to work on my blood sugars" (pt-stated)       Current Barriers:  . Diabetes: uncontrolled as evidenced by BG; most recent A1c 8.2%  o Receiving Ozempic from patient assistance - picked up supply yesterday from the office.  o Notes he self adjusted glipizide to take 10 mg QAM and 5 mg QPM d/t elevated fasting sugars. Wonders if he should continue this while starting Ozempic o Also notes that he self-discontinued pantoprazole - the most recent package insert that he received noted to contact your doctor if you have blood in the urine or watery stools. He notes his watery stools have improved, but he also started taking a probiotic . Current antihyperglycemic regimen: glipizide XL 10 mg QAM, 5 mg QPM; just started Ozempic  o Has been unable to tolerate metformin, IR and ER, d/t diarrhea . Denies any issues w/ hypoglycemia . Cardiovascular risk reduction: o Current hypertensive regimen: HCTZ 25 mg QAM, lisinopril 10 mg QAM o Current hyperlipidemia regimen: rosuvastatin 10 mg daily, ezetimibe 10 mg daily   Pharmacist Clinical Goal(s):  [name] Over the next 90 days, patient will work with PharmD and primary care provider to address optimized medication management  Interventions: . Counseled on Ozempic injection technique and dosing. Counseled on common side effects. Counseled to resume glipizide 5 mg BID dosing for now while starting Ozempic to avoid hypoglycemia. Discussed that we hopefully will be able to reduce/discontinue glipizide therapy, if Ozempic is well tolerated and we can maximize the dose . Discussed long term risks of PPI use. Unsure the etiology of the blood in urine note on package insert. Discussed watery stool can be indicative of c diff. Discussed long term absorption abnormalities; recommended to discuss this with Dr. Danae Orleans at virtual visit tomorrow. Patient was  recommended to be on BID PPI therapy after endoscopy in 03/2017. Patient denies any occurrence of GERD symptoms since d/c pantoprazole. Can consider famotidine therapy instead.   Patient Self Care Activities:  . Patient will check blood glucose BID , document, and provide at future appointments . Patient will take medications as prescribed . Patient will report any questions or concerns to provider   Please see past updates related to this goal by clicking on the "Past Updates" button in the selected goal         The patient verbalized understanding of instructions provided today and declined a print copy of patient instruction materials.   Plan: - Will outreach patient for continued medication management support in 4-5 weeks  [name], PharmD, [place] Pharmacist [place] [phone]

## 2018-11-19 NOTE — Chronic Care Management (AMB) (Signed)
Chronic Care Management   Follow Up Note   11/19/2018 Name: [name] [name] MRN: [id number] DOB: 21-Jun-1940  Referred by: [name], [name] Reason for referral : Chronic Care Management (Medication Management)   [name] [name] is a 78 y.o. year old male who is a primary care patient of [name], [name]. The CCM team was consulted for assistance with chronic disease management and care coordination needs.    Received call from patient yesterday with medication questions.   Review of patient status, including review of consultants reports, relevant laboratory and other test results, and collaboration with appropriate care team members and the patient's provider was performed as part of comprehensive patient evaluation and provision of chronic care management services.    SDOH (Social Determinants of Health) screening performed today: Financial Strain . See Care Plan for related entries.   Outpatient Encounter Medications as of 11/19/2018  Medication Sig Note  . pantoprazole (PROTONIX) 40 MG tablet Take 1 tablet (40 mg total) by mouth 2 (two) times daily. **PLEASE SCHEDULE FOLLOW UP APPT**   . Semaglutide,0.25 or 0.'5MG'$ /DOS, (OZEMPIC, 0.25 OR 0.5 MG/DOSE,) 2 MG/1.5ML SOPN Inject 0.5 mg into the skin once a week.   [name] acetaminophen (TYLENOL) 650 MG CR tablet Take 650 mg by mouth 2 (two) times a day.   [name] aspirin EC 81 MG tablet Take 81 mg by mouth daily.    . blood glucose meter kit and supplies KIT Dispense based on patient and insurance preference. Use up to four times daily as directed. (FOR ICD-9 250.00, 250.01).   . carvedilol (COREG) 3.125 MG tablet TAKE 1 TABLET(3.125 MG) BY MOUTH TWICE DAILY   . ezetimibe (ZETIA) 10 MG tablet Take 1 tablet (10 mg total) by mouth daily.   . fluticasone (FLONASE) 50 MCG/ACT nasal spray Place 2 sprays into both nostrils daily. (Patient taking differently: Place 2 sprays into both nostrils daily as needed for allergies. )   . glipiZIDE  (GLUCOTROL XL) 5 MG 24 hr tablet Take 1 tablet (5 mg total) by mouth 2 (two) times daily. With food   . glucose blood (CONTOUR TEST) test strip 1 each by Other route 2 (two) times daily. Use as instructed   . glucose blood test strip Use as instructed to check blood sugar up to three times daily. E11.42   . hydrochlorothiazide (HYDRODIURIL) 25 MG tablet TAKE 1 TABLET(25 MG) BY MOUTH DAILY   . Insulin Pen Needle (PEN NEEDLES) 30G X 8 MM MISC 1 Device by Does not apply route once a week.   [name] ipratropium (ATROVENT) 0.06 % nasal spray Place 2 sprays into both nostrils 3 (three) times daily.   . Lancets MISC 1 Device by Does not apply route 2 (two) times daily.   [name] lisinopril (ZESTRIL) 10 MG tablet TAKE 1 TABLET BY MOUTH DAILY   . Multiple Vitamin (MULTIVITAMIN) capsule Take 1 capsule by mouth daily with lunch.    . naproxen sodium (ALEVE) 220 MG tablet Take 220 mg by mouth 2 (two) times daily as needed (for pain or headache).   . Omega-3 Fatty Acids (FISH OIL PO) Take 1 capsule by mouth daily.    . Polyvinyl Alcohol-Povidone (MURINE TEARS FOR DRY EYES OP) Place 1-2 drops into both eyes daily as needed (for dry eyes).   . Potassium 99 MG TABS Take 1 tablet by mouth daily. 10/22/2018: QOD   . psyllium (METAMUCIL) 58.6 % powder Take 1 packet by mouth at bedtime.  07/13/2018: PRN  . rosuvastatin (  CRESTOR) 10 MG tablet Take 1 tablet (10 mg total) by mouth daily.   . tamsulosin (FLOMAX) 0.4 MG CAPS capsule TAKE 1 CAPSULE(0.4 MG) BY MOUTH DAILY   . traMADol (ULTRAM) 50 MG tablet TAKE 1 TABLET(50 MG) BY MOUTH EVERY 12 HOURS AS NEEDED    No facility-administered encounter medications on file as of 11/19/2018.      Goals Addressed            This Visit's Progress     Patient Stated   . "I want to work on my blood sugars" (pt-stated)       Current Barriers:  . Diabetes: uncontrolled as evidenced by BG; most recent A1c 8.2%  o Receiving Ozempic from patient assistance - picked up supply yesterday from  the office.  o Notes he self adjusted glipizide to take 10 mg QAM and 5 mg QPM d/t elevated fasting sugars. Wonders if he should continue this while starting Ozempic o Also notes that he self-discontinued pantoprazole - the most recent package insert that he received noted to contact your doctor if you have blood in the urine or watery stools. He notes his watery stools have improved, but he also started taking a probiotic . Current antihyperglycemic regimen: glipizide XL 10 mg QAM, 5 mg QPM; just started Ozempic  o Has been unable to tolerate metformin, IR and ER, d/t diarrhea . Denies any issues w/ hypoglycemia . Cardiovascular risk reduction: o Current hypertensive regimen: HCTZ 25 mg QAM, lisinopril 10 mg QAM o Current hyperlipidemia regimen: rosuvastatin 10 mg daily, ezetimibe 10 mg daily   Pharmacist Clinical Goal(s):  [name] Over the next 90 days, patient will work with PharmD and primary care provider to address optimized medication management  Interventions: . Counseled on Ozempic injection technique and dosing. Counseled on common side effects. Counseled to resume glipizide 5 mg BID dosing for now while starting Ozempic to avoid hypoglycemia. Discussed that we hopefully will be able to reduce/discontinue glipizide therapy, if Ozempic is well tolerated and we can maximize the dose . Discussed long term risks of PPI use. Unsure the etiology of the blood in urine note on package insert. Discussed watery stool can be indicative of c diff. Discussed long term absorption abnormalities; recommended to discuss this with Dr. Danae Orleans at virtual visit tomorrow. Patient was recommended to be on BID PPI therapy after endoscopy in 03/2017. Patient denies any occurrence of GERD symptoms since d/c pantoprazole. Can consider famotidine therapy instead.   Patient Self Care Activities:  . Patient will check blood glucose BID , document, and provide at future appointments . Patient will take medications  as prescribed . Patient will report any questions or concerns to provider   Please see past updates related to this goal by clicking on the "Past Updates" button in the selected goal          Plan: - Will outreach patient for continued medication management support in 4-5 weeks  [name], PharmD, [place] Pharmacist [place] [phone]

## 2018-11-20 ENCOUNTER — Telehealth (INDEPENDENT_AMBULATORY_CARE_PROVIDER_SITE_OTHER): Payer: Medicare Other | Admitting: Internal Medicine

## 2018-11-20 ENCOUNTER — Other Ambulatory Visit: Payer: Self-pay

## 2018-11-20 VITALS — Ht 72.0 in | Wt 202.0 lb

## 2018-11-20 DIAGNOSIS — E118 Type 2 diabetes mellitus with unspecified complications: Secondary | ICD-10-CM

## 2018-11-20 DIAGNOSIS — R319 Hematuria, unspecified: Secondary | ICD-10-CM

## 2018-11-20 DIAGNOSIS — K2210 Ulcer of esophagus without bleeding: Secondary | ICD-10-CM | POA: Diagnosis not present

## 2018-11-20 DIAGNOSIS — R197 Diarrhea, unspecified: Secondary | ICD-10-CM

## 2018-11-20 NOTE — Progress Notes (Signed)
Virtual Visit via Video Note  I connected with [name] [name]   on 11/20/18 at 11:25 AM EST by a video enabled telemedicine application and verified that I am speaking with the correct person using two identifiers.  Location patient: home Location provider:work or home office Persons participating in the virtual visit: patient, provider, pts wife   I discussed the limitations of evaluation and management by telemedicine and the availability of in person appointments. The patient expressed understanding and agreed to proceed.   HPI: 1. C/o diarrhea watery stools 1-2 in the am and c/w protonix 40 mg bid causing so stopped this x 1 week ago as he read this can cause diarrhea and blood in urine He was on this due to prior EGD + esophageal ulcers  -will have pt f/u with Dr. [name] as it has been years  Due to h/o low grade fever 97.5 max 100 F will rule out infectious causes as well  -he is also wondering if meds could be causing diarrhea as this happened the past with Metformin and watery stools happens in the am after meds  He denies h/o GERD  He reports when he was younger he possibly had a ? Parasite infection in 1965/66/67 in stool   2. DM 2 cbgs 140s-150s to mid 160s in the am fasting on glipizide 5 mg xl bid wondering if this could be causing diarrhea  ozempic picked up Rx 11/19/2018 but has not started the Rx   3. Of note pt reported to RN he tripped over his puppy and has been under stress wife recently tested + COVID mid 10/2018 but he tested negative 10/19/2018 and she is doing ok and called by health dept and they are out of quarantine  4. Blood in urine pt to call and reschedule CT scan was on hold due to wife testing + covid but now out of quarantine and negative tested 11/12/2018 by health dept   ROS: See pertinent positives and negatives per HPI.  Past Medical History:  Diagnosis Date  . Aortic insufficiency    a. noted on TTE 2015  . Arthritis   . CAD (coronary artery  disease)    a. remote PCI in 1991 and 2005; b. MV 3/15: old inferior MI, no ischemia, LVEF 50%, slight inferior wall hypokniesis  . Chicken pox   . Colon polyps    4 pre-cancerous   . Diverticulitis   . DM type 2 (diabetes mellitus, type 2) ([place])   . Family history of adverse reaction to anesthesia   . GERD (gastroesophageal reflux disease)   . Heart murmur   . History of kidney stones   . Hyperlipidemia   . Hypertension   . Kidney stones   . Melanoma ([place]) 1980   Resected from his back  . Mitral regurgitation    a. noted on TTE 2015  . Myocardial infarction ([place])   . Systolic dysfunction    a. TTE 2015: EF  50-55%, mild global hypokinesis, mild to moderate aortic sclerosis, mild aortic insufficiency, mild to moderate mitral regurgitation, mild tricuspid regurgitation, moderately dilated left atrium, mildly dilated right ventricle, PASP 31 mmHg    Past Surgical History:  Procedure Laterality Date  . CHOLECYSTECTOMY  2012  . COLONOSCOPY     in 2003 with polyp removed and leak anastomosis had to have open abdominal surgery   . CORONARY ANGIOPLASTY WITH STENT PLACEMENT  1991 & 2005  . CYSTOSCOPY/URETEROSCOPY/HOLMIUM LASER/STENT PLACEMENT Right 01/02/2018   Procedure: CYSTOSCOPY/URETEROSCOPY/HOLMIUM LASER/STENT  PLACEMENT;  Surgeon: [name], MD;  Location: [facility] ORS;  Service: Urology;  Laterality: Right;  . ESOPHAGOGASTRODUODENOSCOPY (EGD) WITH PROPOFOL N/A 11/19/2016   Procedure: ESOPHAGOGASTRODUODENOSCOPY (EGD) WITH PROPOFOL;  Surgeon: [name], MD;  Location: [facility] ENDOSCOPY;  Service: Endoscopy;  Laterality: N/A;  . LITHOTRIPSY  2015  . MELANOMA EXCISION  1980   malignant  . NERVE SURGERY  2015   ulna nerve  . TONSILLECTOMY  1945  . WISDOM TOOTH EXTRACTION      Family History  Problem Relation Age of Onset  . Hyperlipidemia Mother   . Hypertension Mother   . Heart disease Mother   . Diabetes Mother   . Heart attack Mother   . Colon cancer Father   . Lung  cancer Father   . Kidney cancer Father        malignant capsulated kidney tumor  . Diabetes Father   . Liver cancer Father   . Bladder Cancer Neg Hx   . Prostate cancer Neg Hx     SOCIAL HX:  Married lives with wife    Current Outpatient Medications:  .  acetaminophen (TYLENOL) 650 MG CR tablet, Take 650 mg by mouth 2 (two) times a day., Disp: , Rfl:  .  aspirin EC 81 MG tablet, Take 81 mg by mouth daily. , Disp: , Rfl:  .  blood glucose meter kit and supplies KIT, Dispense based on patient and insurance preference. Use up to four times daily as directed. (FOR ICD-9 250.00, 250.01)., Disp: 1 each, Rfl: 0 .  carvedilol (COREG) 3.125 MG tablet, TAKE 1 TABLET(3.125 MG) BY MOUTH TWICE DAILY, Disp: 180 tablet, Rfl: 1 .  fluticasone (FLONASE) 50 MCG/ACT nasal spray, Place 2 sprays into both nostrils daily. (Patient taking differently: Place 2 sprays into both nostrils daily as needed for allergies. ), Disp: 16 g, Rfl: 6 .  glipiZIDE (GLUCOTROL XL) 5 MG 24 hr tablet, Take 1 tablet (5 mg total) by mouth 2 (two) times daily. With food, Disp: 180 tablet, Rfl: 3 .  glucose blood (CONTOUR TEST) test strip, 1 each by Other route 2 (two) times daily. Use as instructed, Disp: 200 each, Rfl: 3 .  glucose blood test strip, Use as instructed to check blood sugar up to three times daily. E11.42, Disp: 100 each, Rfl: 12 .  hydrochlorothiazide (HYDRODIURIL) 25 MG tablet, TAKE 1 TABLET(25 MG) BY MOUTH DAILY, Disp: 90 tablet, Rfl: 0 .  Insulin Pen Needle (PEN NEEDLES) 30G X 8 MM MISC, 1 Device by Does not apply route once a week., Disp: 90 each, Rfl: 3 .  ipratropium (ATROVENT) 0.06 % nasal spray, Place 2 sprays into both nostrils 3 (three) times daily., Disp: 15 mL, Rfl: 12 .  Lancets MISC, 1 Device by Does not apply route 2 (two) times daily., Disp: 180 each, Rfl: 3 .  lisinopril (ZESTRIL) 10 MG tablet, TAKE 1 TABLET BY MOUTH DAILY, Disp: 90 tablet, Rfl: 1 .  Multiple Vitamin (MULTIVITAMIN) capsule, Take 1  capsule by mouth daily with lunch. , Disp: , Rfl:  .  naproxen sodium (ALEVE) 220 MG tablet, Take 220 mg by mouth 2 (two) times daily as needed (for pain or headache)., Disp: , Rfl:  .  Omega-3 Fatty Acids (FISH OIL PO), Take 1 capsule by mouth daily. , Disp: , Rfl:  .  pantoprazole (PROTONIX) 40 MG tablet, Take 1 tablet (40 mg total) by mouth 2 (two) times daily. **PLEASE SCHEDULE FOLLOW UP APPT**, Disp: 60 tablet, Rfl: 0 .  Polyvinyl Alcohol-Povidone (MURINE TEARS FOR DRY EYES OP), Place 1-2 drops into both eyes daily as needed (for dry eyes)., Disp: , Rfl:  .  Potassium 99 MG TABS, Take 1 tablet by mouth daily., Disp: , Rfl:  .  psyllium (METAMUCIL) 58.6 % powder, Take 1 packet by mouth at bedtime. , Disp: , Rfl:  .  Semaglutide,0.25 or 0.'5MG'$ /DOS, (OZEMPIC, 0.25 OR 0.5 MG/DOSE,) 2 MG/1.5ML SOPN, Inject 0.5 mg into the skin once a week., Disp: , Rfl:  .  tamsulosin (FLOMAX) 0.4 MG CAPS capsule, TAKE 1 CAPSULE(0.4 MG) BY MOUTH DAILY, Disp: 90 capsule, Rfl: 3 .  traMADol (ULTRAM) 50 MG tablet, TAKE 1 TABLET(50 MG) BY MOUTH EVERY 12 HOURS AS NEEDED, Disp: 60 tablet, Rfl: 1 .  ezetimibe (ZETIA) 10 MG tablet, Take 1 tablet (10 mg total) by mouth daily., Disp: 90 tablet, Rfl: 3 .  rosuvastatin (CRESTOR) 10 MG tablet, Take 1 tablet (10 mg total) by mouth daily., Disp: 90 tablet, Rfl: 3  EXAM:  VITALS per patient if applicable:  GENERAL: alert, oriented, appears well and in no acute distress  HEENT: atraumatic, conjunttiva clear, no obvious abnormalities on inspection of external nose and ears  NECK: normal movements of the [name] and neck  LUNGS: on inspection no signs of respiratory distress, breathing rate appears normal, no obvious gross SOB, gasping or wheezing  CV: no obvious cyanosis  MS: moves all visible extremities without noticeable abnormality  PSYCH/NEURO: pleasant and cooperative, no obvious depression or anxiety, speech and thought processing grossly intact  ASSESSMENT AND  PLAN:  Discussed the following assessment and plan:  Watery diarrhea ? If related to meds, r/o infection vs other I.e IBS or due to overflow constipation- Plan: OVA + PARASITE EXAM, Stool Culture, Clostridium Difficile by PCR([facility]) -will have pt f/u with Dr. [name] CC Dr. [name] to try to get f/u for this   Ulcer of esophagus noted EGD 11/19/16  -will have pt f/u with Dr. [name] to review other PPI options which have less risk of diarrhea or further tx options  -hold protonix 40 mg bid for now until review tx options with GI as pt c/w side effects water diarrhea and hematuria could be adverse effect of the medication   Hematuria, unspecified type -resch CT ab pelvis 12/01/18 r/o kidney stones  -of note pt also commented in package review of protonix this could be side effect in package insert though my review of up to date does not mention this   uncontrolled type 2 diabetes mellitus with hyperglycemia A1C 09/28/2018 8.4  -rec pt reduce glipizide 5 mg XL from bid to qd to see if helps with diarrhea and also with starting Ozempic 0.25 1x per week which reviewed has small risk of diarrhea     -we discussed possible serious and likely etiologies, options for evaluation and workup, limitations of telemedicine visit vs in person visit, treatment, treatment risks and precautions. Pt prefers to treat via telemedicine empirically rather then risking or undertaking an in person visit at this moment. Patient agrees to seek prompt in person care if worsening, new symptoms arise, or if is not improving with treatment.   I discussed the assessment and treatment plan with the patient. The patient was provided an opportunity to ask questions and all were answered. The patient agreed with the plan and demonstrated an understanding of the instructions.   The patient was advised to call back or seek an in-person evaluation if the symptoms worsen or if the condition  fails to improve as anticipated.  Time  spent 20 minutes  Delorise Jackson, MD

## 2018-11-23 ENCOUNTER — Encounter: Payer: Self-pay | Admitting: Internal Medicine

## 2018-11-23 ENCOUNTER — Other Ambulatory Visit
Admission: RE | Admit: 2018-11-23 | Discharge: 2018-11-23 | Disposition: A | Discharge status: Home / Self Care | Payer: Medicare Other | Source: Ambulatory Visit | Attending: Internal Medicine | Admitting: Internal Medicine

## 2018-11-23 DIAGNOSIS — R197 Diarrhea, unspecified: Secondary | ICD-10-CM | POA: Insufficient documentation

## 2018-11-23 LAB — C DIFFICILE QUICK SCREEN W PCR REFLEX
C Diff antigen: NEGATIVE
C Diff interpretation: NOT DETECTED
C Diff toxin: NEGATIVE

## 2018-11-24 ENCOUNTER — Other Ambulatory Visit: Payer: Self-pay | Admitting: Pharmacy Technician

## 2018-11-24 NOTE — Patient Outreach (Signed)
[place] [facility]) Care Management  11/24/2018  Cecilia Fordham [birth year] [id number]  ADDENDUM  Incoming call received from patient in regards to [employer] application for [employer] to patient, HIPAA identifiers verified.  Patient informed he picked up 5 boxes of Ozempic. Patient denied having any questions or concerns about the medication. Patient informed he started the medication on Sunday 11/22/2018. Informed patient that he would have to re enroll for 2021 in January and embedded [place] would followup with him concerning re enrollment after the first of the year. Patient verbalized understanding.Confirmed patient had name and number.  Will route note to embedded [facility] RPh [name] that patient assistance has been completed and will remove myself from care team.  Luiz Ochoa. Leary Mcnulty, [place] Management [phone]

## 2018-11-24 NOTE — Patient Outreach (Signed)
[place] [facility]) Care Management  11/24/2018  [name] [name] 05-Jun-1940 [id number]  Unsuccessful outreach call placed to patient in regards to [employer] application for [employer].  Unfortunately patient did not answer the phone, HIPAA compliant voicemail was left.  Was calling patient to inquire if he had picked up the Ozempic and to inform him about re enrollment for 2021.  Will followup with 2nd outreach in 5-10 business days if call is not returned.  Jarrin Staley P. Deirdre Gryder, [place] Management [phone]

## 2018-11-26 LAB — GIARDIA, EIA; OVA/PARASITE: Giardia Ag, Stl: NEGATIVE

## 2018-11-26 LAB — O&P RESULT

## 2018-11-27 LAB — STOOL CULTURE REFLEX - CMPCXR

## 2018-11-27 LAB — STOOL CULTURE REFLEX - RSASHR

## 2018-11-27 LAB — STOOL CULTURE: E coli, Shiga toxin Assay: NEGATIVE

## 2018-12-01 ENCOUNTER — Ambulatory Visit
Admission: RE | Admit: 2018-12-01 | Discharge: 2018-12-01 | Disposition: A | Discharge status: Home / Self Care | Payer: Medicare Other | Source: Ambulatory Visit | Attending: Internal Medicine | Admitting: Internal Medicine

## 2018-12-01 ENCOUNTER — Other Ambulatory Visit: Payer: Self-pay

## 2018-12-01 DIAGNOSIS — R319 Hematuria, unspecified: Secondary | ICD-10-CM | POA: Insufficient documentation

## 2018-12-01 DIAGNOSIS — N200 Calculus of kidney: Secondary | ICD-10-CM | POA: Diagnosis not present

## 2018-12-01 DIAGNOSIS — R109 Unspecified abdominal pain: Secondary | ICD-10-CM

## 2018-12-02 ENCOUNTER — Encounter: Payer: Self-pay | Admitting: Internal Medicine

## 2018-12-15 NOTE — Telephone Encounter (Signed)
I reviewed his CT from [facility] November and it looks good. Some stable left sided renal stones since 2018, right side clean after his last stone surgery with me.   Keep scheduled follow up with me, should be seen sooner if he develops gross hematuria.  Thanks Nickolas Madrid, MD 12/15/2018

## 2018-12-16 ENCOUNTER — Telehealth: Payer: Self-pay | Admitting: Family

## 2018-12-16 ENCOUNTER — Other Ambulatory Visit: Payer: Self-pay

## 2018-12-16 NOTE — Telephone Encounter (Signed)
Pt wife called about pt ran out of traMADol (ULTRAM) 50 MG tablet. Pharmacist states it could be withdrawal from not having medication for three days. Rx refill request was sent in to ofc. Pt is having flu like symptoms. Please advise and Thank you!  Pharmacy is [facility] [id number] - [address], [place]  Call pt @ [phone]

## 2018-12-16 NOTE — Telephone Encounter (Signed)
rx refill traMADol (ULTRAM) 50 MG tablet  PHARMACY [facility] [id number] [address], [place] AT [phone] (Phone) [phone] (Fax)

## 2018-12-16 NOTE — Telephone Encounter (Signed)
I believe this is the patient you just sent a refill request to Dr McLean-Scocuzza for.

## 2018-12-16 NOTE — Telephone Encounter (Signed)
Last OV:  10/23/18  Last Refill:  10/12/18

## 2018-12-17 ENCOUNTER — Telehealth: Payer: Medicare Other

## 2018-12-17 ENCOUNTER — Other Ambulatory Visit: Payer: Self-pay | Admitting: Internal Medicine

## 2018-12-17 ENCOUNTER — Encounter: Payer: Self-pay | Admitting: Internal Medicine

## 2018-12-17 DIAGNOSIS — G8929 Other chronic pain: Secondary | ICD-10-CM

## 2018-12-17 MED ORDER — TRAMADOL HCL 50 MG PO TABS: ORAL_TABLET | ORAL | 0 refills | Status: DC

## 2018-12-24 ENCOUNTER — Ambulatory Visit (INDEPENDENT_AMBULATORY_CARE_PROVIDER_SITE_OTHER): Payer: Medicare Other | Admitting: Pharmacist

## 2018-12-24 DIAGNOSIS — E782 Mixed hyperlipidemia: Secondary | ICD-10-CM | POA: Diagnosis not present

## 2018-12-24 DIAGNOSIS — I25118 Atherosclerotic heart disease of native coronary artery with other forms of angina pectoris: Secondary | ICD-10-CM | POA: Diagnosis not present

## 2018-12-24 DIAGNOSIS — E1165 Type 2 diabetes mellitus with hyperglycemia: Secondary | ICD-10-CM | POA: Diagnosis not present

## 2018-12-24 NOTE — Patient Instructions (Signed)
Visit Information  Goals Addressed            This Visit's Progress     Patient Stated   . "I want to work on my blood sugars" (pt-stated)       Current Barriers:  . Diabetes: uncontrolled; most recent [id number]; complicated by CAD, BPH o Started Ozempic 0.25 mg weekly w/ glipizide IR 5 mg BID; resulting in fasting average in 160s; o Then switched glipizide to ER in AM, IR at supper; resulted in range 130-180s o Three days ago, switched glipizide to ER BID; resulted in range ~130s over the past 3 days; though fasting this morning was 168 . Current antihyperglycemic regimen: Ozempic 0.25 mg weekly; glipizide XR 5 mg QAM o Has been concerned about increasing Ozempic due to having continued  watery diarrhea and some constipation;  o Has been unable to tolerate metformin, IR and ER, d/t diarrhea . Denies any issues w/ hypoglycemia . Cardiovascular risk reduction: o Current hypertensive regimen: HCTZ 25 mg QAM, lisinopril 10 mg QAM o Current hyperlipidemia regimen: rosuvastatin 10 mg daily, ezetimibe 10 mg daily; has NOT had repeat lipid panel since starting rosuvastatin therapy in May per Dr. Rockey Situ, last LDL 70  Pharmacist Clinical Goal(s):  [name] Over the next 90 days, patient will work with PharmD and primary care provider to address optimized medication management  Interventions: . Counseled on long term benefits of GLP1 therapy, including weight loss, ASCVD benefit. Counseled on long term risks of sulfonlyurea therapy, including weight gain, b cell burnout, and hypoglycemia. Reiterated that our goal is to use max tolerated dose of non-sulfonylurea therapy d/t greater benefit vs risk profile. Encouraged patient to increase Ozempic to 0.5 mg as prescribed. Patient will do so at his next dose on Sunday.  . Reiterated that higher dose may cause nausea, stomach fullness, but that watery diarrhea is not normally a side effect of this class of medications. Encouraged f/u with Dr. [name] as scheduled.  Encouraged that GI symptoms with GLP1 initiation/titration generally improve with subsequent doses.  . Will plan to re-apply for [employer] assistance in January 2021 . Moving forward, recommend updated lipid panel w/ next scheduled labs to ensure LDL at goal <70  Patient Self Care Activities:  . Patient will check blood glucose BID , document, and provide at future appointments . Patient will take medications as prescribed . Patient will report any questions or concerns to provider   Please see past updates related to this goal by clicking on the "Past Updates" button in the selected goal         The patient verbalized understanding of instructions provided today and declined a print copy of patient instruction materials.    Plan: - Scheduled f/u call with patient in ~4-5 weeks for continued medication management support  [name], PharmD, [place], CPP Clinical Pharmacist [place] [phone]

## 2018-12-24 NOTE — Chronic Care Management (AMB) (Signed)
Chronic Care Management   Follow Up Note   12/24/2018 Name: Devan Danzer MRN: [id number] DOB: May 30, 1940  Referred by: [name], [name] Reason for referral : Chronic Care Management (Medication Management)   Tukker Byrns is a 78 y.o. year old male who is a primary care patient of [name], [name]. The CCM team was consulted for assistance with chronic disease management and care coordination needs.    Contacted patient for medication management review.   Review of patient status, including review of consultants reports, relevant laboratory and other test results, and collaboration with appropriate care team members and the patient's provider was performed as part of comprehensive patient evaluation and provision of chronic care management services.    SDOH (Social Determinants of Health) screening performed today: Financial Strain . See Care Plan for related entries.   Outpatient Encounter Medications as of 12/24/2018  Medication Sig Note  . aspirin EC 81 MG tablet Take 81 mg by mouth daily.    . blood glucose meter kit and supplies KIT Dispense based on patient and insurance preference. Use up to four times daily as directed. (FOR ICD-9 250.00, 250.01).   . carvedilol (COREG) 3.125 MG tablet TAKE 1 TABLET(3.125 MG) BY MOUTH TWICE DAILY   . ezetimibe (ZETIA) 10 MG tablet Take 1 tablet (10 mg total) by mouth daily.   . fluticasone (FLONASE) 50 MCG/ACT nasal spray Place 2 sprays into both nostrils daily. (Patient taking differently: Place 2 sprays into both nostrils daily as needed for allergies. )   . glipiZIDE (GLUCOTROL XL) 5 MG 24 hr tablet Take 1 tablet (5 mg total) by mouth 2 (two) times daily. With food   . glucose blood (CONTOUR TEST) test strip 1 each by Other route 2 (two) times daily. Use as instructed   . glucose blood test strip Use as instructed to check blood sugar up to three times daily. E11.42   . hydrochlorothiazide (HYDRODIURIL) 25 MG tablet TAKE  1 TABLET(25 MG) BY MOUTH DAILY   . Insulin Pen Needle (PEN NEEDLES) 30G X 8 MM MISC 1 Device by Does not apply route once a week.   [name] ipratropium (ATROVENT) 0.06 % nasal spray Place 2 sprays into both nostrils 3 (three) times daily.   . Lancets MISC 1 Device by Does not apply route 2 (two) times daily.   [name] lisinopril (ZESTRIL) 10 MG tablet TAKE 1 TABLET BY MOUTH DAILY   . Multiple Vitamin (MULTIVITAMIN) capsule Take 1 capsule by mouth daily with lunch.    . naproxen sodium (ALEVE) 220 MG tablet Take 220 mg by mouth 2 (two) times daily as needed (for pain or headache).   . Omega-3 Fatty Acids (FISH OIL PO) Take 1 capsule by mouth daily.    . pantoprazole (PROTONIX) 40 MG tablet Take 1 tablet (40 mg total) by mouth 2 (two) times daily. **PLEASE SCHEDULE FOLLOW UP APPT**   . Polyvinyl Alcohol-Povidone (MURINE TEARS FOR DRY EYES OP) Place 1-2 drops into both eyes daily as needed (for dry eyes).   . Potassium 99 MG TABS Take 1 tablet by mouth daily. 10/22/2018: QOD   . psyllium (METAMUCIL) 58.6 % powder Take 1 packet by mouth at bedtime.  07/13/2018: PRN  . Semaglutide,0.25 or 0.5MG/DOS, (OZEMPIC, 0.25 OR 0.5 MG/DOSE,) 2 MG/1.5ML SOPN Inject 0.5 mg into the skin once a week. 12/24/2018: Taking 0.25 mg weekly  . tamsulosin (FLOMAX) 0.4 MG CAPS capsule TAKE 1 CAPSULE(0.4 MG) BY MOUTH DAILY   . traMADol Veatrice Bourbon)  50 MG tablet TAKE 1 TABLET(50 MG) BY MOUTH EVERY 12 HOURS AS NEEDED. Further refills PCP [name]   . acetaminophen (TYLENOL) 650 MG CR tablet Take 650 mg by mouth 2 (two) times a day.   . rosuvastatin (CRESTOR) 10 MG tablet Take 1 tablet (10 mg total) by mouth daily.    No facility-administered encounter medications on file as of 12/24/2018.     Goals Addressed            This Visit's Progress     Patient Stated   . "I want to work on my blood sugars" (pt-stated)       Current Barriers:  . Diabetes: uncontrolled; most recent Y5T 0.9%; complicated by CAD, BPH o Started Ozempic  0.25 mg weekly w/ glipizide IR 5 mg BID; resulting in fasting average in 160s; o Then switched glipizide to ER in AM, IR at supper; resulted in range 130-180s o Three days ago, switched glipizide to ER BID; resulted in range ~130s over the past 3 days; though fasting this morning was 168 . Current antihyperglycemic regimen: Ozempic 0.25 mg weekly; glipizide XR 5 mg QAM o Has been concerned about increasing Ozempic due to having continued  watery diarrhea and some constipation;  o Has been unable to tolerate metformin, IR and ER, d/t diarrhea . Denies any issues w/ hypoglycemia . Cardiovascular risk reduction: o Current hypertensive regimen: HCTZ 25 mg QAM, lisinopril 10 mg QAM o Current hyperlipidemia regimen: rosuvastatin 10 mg daily, ezetimibe 10 mg daily; has NOT had repeat lipid panel since starting rosuvastatin therapy in May per Dr. Rockey Situ, last LDL 70  Pharmacist Clinical Goal(s):  [name] Over the next 90 days, patient will work with PharmD and primary care provider to address optimized medication management  Interventions: . Counseled on long term benefits of GLP1 therapy, including weight loss, ASCVD benefit. Counseled on long term risks of sulfonlyurea therapy, including weight gain, b cell burnout, and hypoglycemia. Reiterated that our goal is to use max tolerated dose of non-sulfonylurea therapy d/t greater benefit vs risk profile. Encouraged patient to increase Ozempic to 0.5 mg as prescribed. Patient will do so at his next dose on Sunday.  . Reiterated that higher dose may cause nausea, stomach fullness, but that watery diarrhea is not normally a side effect of this class of medications. Encouraged f/u with Dr. [name] as scheduled. Encouraged that GI symptoms with GLP1 initiation/titration generally improve with subsequent doses.  . Will plan to re-apply for [employer] assistance in January 2021 . Moving forward, recommend updated lipid panel w/ next scheduled labs to ensure LDL at goal  <70  Patient Self Care Activities:  . Patient will check blood glucose BID , document, and provide at future appointments . Patient will take medications as prescribed . Patient will report any questions or concerns to provider   Please see past updates related to this goal by clicking on the "Past Updates" button in the selected goal          Plan: - Scheduled f/u call with patient in ~4-5 weeks for continued medication management support  [name], PharmD, [place], CPP Clinical Pharmacist [place] [phone]

## 2018-12-28 ENCOUNTER — Other Ambulatory Visit: Payer: Self-pay

## 2018-12-29 DIAGNOSIS — D485 Neoplasm of uncertain behavior of skin: Secondary | ICD-10-CM | POA: Diagnosis not present

## 2018-12-29 DIAGNOSIS — R208 Other disturbances of skin sensation: Secondary | ICD-10-CM | POA: Diagnosis not present

## 2019-01-13 ENCOUNTER — Telehealth: Payer: Self-pay | Admitting: Family

## 2019-01-13 DIAGNOSIS — G8929 Other chronic pain: Secondary | ICD-10-CM

## 2019-01-13 DIAGNOSIS — M549 Dorsalgia, unspecified: Secondary | ICD-10-CM

## 2019-01-13 MED ORDER — TRAMADOL HCL 50 MG PO TABS: ORAL_TABLET | ORAL | 1 refills | Status: DC

## 2019-01-13 NOTE — Telephone Encounter (Signed)
Call pt   I have refilled your tramadol  However I wanted to remind you that this is controlled substance.   In order for me to prescribe medication,  patients must be seen every 3 months.   Please make follow-up appointment this month for any further refills.    I looked up patient on [place] Controlled Substances Reporting System and saw no activity that raised concern of inappropriate use.   

## 2019-01-13 NOTE — Telephone Encounter (Signed)
Pt needs a refill on traMADol (ULTRAM) 50 MG tablet sent to [facility]

## 2019-01-13 NOTE — Telephone Encounter (Signed)
Patient has appointment 01/27/19.

## 2019-01-19 ENCOUNTER — Encounter: Payer: Self-pay | Admitting: Gastroenterology

## 2019-01-19 ENCOUNTER — Other Ambulatory Visit: Payer: Self-pay

## 2019-01-19 ENCOUNTER — Ambulatory Visit (INDEPENDENT_AMBULATORY_CARE_PROVIDER_SITE_OTHER): Payer: Medicare Other | Admitting: Gastroenterology

## 2019-01-19 VITALS — BP 130/64 | HR 41 | Temp 98.2°F | Ht 73.0 in | Wt 208.6 lb

## 2019-01-19 DIAGNOSIS — K219 Gastro-esophageal reflux disease without esophagitis: Secondary | ICD-10-CM | POA: Diagnosis not present

## 2019-01-19 DIAGNOSIS — K2210 Ulcer of esophagus without bleeding: Secondary | ICD-10-CM

## 2019-01-19 NOTE — Progress Notes (Signed)
Primary Care Physician: [name], [name]  Primary Gastroenterologist:  Dr. [name]  Chief Complaint  Patient presents with  . Diarrhea    HPI: [name] [name] is a 79 y.o. male here with a history of watery diarrhea.  The patient had seen me in November 2018 with weight loss and was found to have an esophageal ulcer.  The patient had had previous colonoscopies with his last one done in [place] in 2017.  The patient was taking pantoprazole and thought that that may have caused his diarrhea.  The patient was on the pantoprazole due to his esophageal ulcer in the past. The patient reports that he was taking pantoprazole twice a day and that his diarrhea went away when he started taking only once a day.  He has had no further diarrhea.  He denies any black stools or bloody stools.  There is no report of any food getting stuck in his esophagus when he eats.  Current Outpatient Medications  Medication Sig Dispense Refill  . acetaminophen (TYLENOL) 650 MG CR tablet Take 650 mg by mouth 2 (two) times a day.    [name] aspirin EC 81 MG tablet Take 81 mg by mouth daily.     . blood glucose meter kit and supplies KIT Dispense based on patient and insurance preference. Use up to four times daily as directed. (FOR ICD-9 250.00, 250.01). 1 each 0  . carvedilol (COREG) 3.125 MG tablet TAKE 1 TABLET(3.125 MG) BY MOUTH TWICE DAILY 180 tablet 1  . fluticasone (FLONASE) 50 MCG/ACT nasal spray Place 2 sprays into both nostrils daily. (Patient taking differently: Place 2 sprays into both nostrils daily as needed for allergies. ) 16 g 6  . glipiZIDE (GLUCOTROL XL) 5 MG 24 hr tablet Take 1 tablet (5 mg total) by mouth 2 (two) times daily. With food 180 tablet 3  . glucose blood (CONTOUR TEST) test strip 1 each by Other route 2 (two) times daily. Use as instructed 200 each 3  . glucose blood test strip Use as instructed to check blood sugar up to three times daily. E11.42 100 each 12  .  hydrochlorothiazide (HYDRODIURIL) 25 MG tablet TAKE 1 TABLET(25 MG) BY MOUTH DAILY 90 tablet 0  . Insulin Pen Needle (PEN NEEDLES) 30G X 8 MM MISC 1 Device by Does not apply route once a week. 90 each 3  . ipratropium (ATROVENT) 0.06 % nasal spray Place 2 sprays into both nostrils 3 (three) times daily. 15 mL 12  . Lancets MISC 1 Device by Does not apply route 2 (two) times daily. 180 each 3  . lisinopril (ZESTRIL) 10 MG tablet TAKE 1 TABLET BY MOUTH DAILY 90 tablet 1  . Multiple Vitamin (MULTIVITAMIN) capsule Take 1 capsule by mouth daily with lunch.     . naproxen sodium (ALEVE) 220 MG tablet Take 220 mg by mouth 2 (two) times daily as needed (for pain or headache).    . Omega-3 Fatty Acids (FISH OIL PO) Take 1 capsule by mouth daily.     . pantoprazole (PROTONIX) 40 MG tablet Take 1 tablet (40 mg total) by mouth 2 (two) times daily. **PLEASE SCHEDULE FOLLOW UP APPT** 60 tablet 0  . Polyvinyl Alcohol-Povidone (MURINE TEARS FOR DRY EYES OP) Place 1-2 drops into both eyes daily as needed (for dry eyes).    . Potassium 99 MG TABS Take 1 tablet by mouth daily.    . psyllium (METAMUCIL) 58.6 % powder Take 1 packet by mouth  at bedtime.     . Semaglutide,0.25 or 0.'5MG'$ /DOS, (OZEMPIC, 0.25 OR 0.5 MG/DOSE,) 2 MG/1.5ML SOPN Inject 0.5 mg into the skin once a week.    . tamsulosin (FLOMAX) 0.4 MG CAPS capsule TAKE 1 CAPSULE(0.4 MG) BY MOUTH DAILY 90 capsule 3  . traMADol (ULTRAM) 50 MG tablet TAKE 1 TABLET(50 MG) BY MOUTH EVERY 12 HOURS AS NEEDED. Further refills PCP [name] 60 tablet 1  . ezetimibe (ZETIA) 10 MG tablet Take 1 tablet (10 mg total) by mouth daily. 90 tablet 3  . rosuvastatin (CRESTOR) 10 MG tablet Take 1 tablet (10 mg total) by mouth daily. 90 tablet 3   No current facility-administered medications for this visit.    Allergies as of 01/19/2019 - Review Complete 01/19/2019  Allergen Reaction Noted  . Metformin and related Diarrhea 07/13/2018  . Azithromycin Other (See Comments)  07/07/2015    ROS:  General: Negative for anorexia, weight loss, fever, chills, fatigue, weakness. ENT: Negative for hoarseness, difficulty swallowing , nasal congestion. CV: Negative for chest pain, angina, palpitations, dyspnea on exertion, peripheral edema.  Respiratory: Negative for dyspnea at rest, dyspnea on exertion, cough, sputum, wheezing.  GI: See history of present illness. GU:  Negative for dysuria, hematuria, urinary incontinence, urinary frequency, nocturnal urination.  Endo: Negative for unusual weight change.    Physical Examination:   BP 130/64   Pulse (!) 41   Temp 98.2 F (36.8 C) (Oral)   Ht '6\' 1"'$  (1.854 m)   Wt 208 lb 9.6 oz (94.6 kg)   BMI 27.52 kg/m   General: Well-nourished, well-developed in no acute distress.  Eyes: No icterus. Conjunctivae pink. Lungs: Clear to auscultation bilaterally. Non-labored. Heart: Regular rate and rhythm, no murmurs rubs or gallops.  Abdomen: Bowel sounds are normal, nontender, nondistended, no hepatosplenomegaly or masses, no abdominal bruits or hernia , no rebound or guarding.   Extremities: No lower extremity edema. No clubbing or deformities. Neuro: Alert and oriented x 3.  Grossly intact. Skin: Warm and dry, no jaundice.   Psych: Alert and cooperative, normal mood and affect.  Labs:    Imaging Studies: No results found.  Assessment and Plan:   [name] is a 79 y.o. y/o male who comes in today with a history of diarrhea that completely stopped when he went from pantoprazole twice a day to once a day.  The patient was taking the pantoprazole due to an esophageal ulcer.  The patient has not followed up in the past for this esophageal ulcer since having the EGD.  The patient will be set up for an upper endoscopy to look for any signs of reflux or strictures due to his significant past esophageal ulcer.  The area will also be inspected for possible neoplasm.  The patient has been explained the plan and agrees with  it.     [name], MD. [name]    Note: This dictation was prepared with Dragon dictation along with smaller phrase technology. Any transcriptional errors that result from this process are unintentional.

## 2019-01-19 NOTE — H&P (View-Only) (Signed)
Primary Care Physician: [name], [name]  Primary Gastroenterologist:  Dr. [name]  Chief Complaint  Patient presents with  . Diarrhea    HPI: [name] [name] is a 79 y.o. male here with a history of watery diarrhea.  The patient had seen me in November 2018 with weight loss and was found to have an esophageal ulcer.  The patient had had previous colonoscopies with his last one done in [place] in 2017.  The patient was taking pantoprazole and thought that that may have caused his diarrhea.  The patient was on the pantoprazole due to his esophageal ulcer in the past. The patient reports that he was taking pantoprazole twice a day and that his diarrhea went away when he started taking only once a day.  He has had no further diarrhea.  He denies any black stools or bloody stools.  There is no report of any food getting stuck in his esophagus when he eats.  Current Outpatient Medications  Medication Sig Dispense Refill  . acetaminophen (TYLENOL) 650 MG CR tablet Take 650 mg by mouth 2 (two) times a day.    [name] aspirin EC 81 MG tablet Take 81 mg by mouth daily.     . blood glucose meter kit and supplies KIT Dispense based on patient and insurance preference. Use up to four times daily as directed. (FOR ICD-9 250.00, 250.01). 1 each 0  . carvedilol (COREG) 3.125 MG tablet TAKE 1 TABLET(3.125 MG) BY MOUTH TWICE DAILY 180 tablet 1  . fluticasone (FLONASE) 50 MCG/ACT nasal spray Place 2 sprays into both nostrils daily. (Patient taking differently: Place 2 sprays into both nostrils daily as needed for allergies. ) 16 g 6  . glipiZIDE (GLUCOTROL XL) 5 MG 24 hr tablet Take 1 tablet (5 mg total) by mouth 2 (two) times daily. With food 180 tablet 3  . glucose blood (CONTOUR TEST) test strip 1 each by Other route 2 (two) times daily. Use as instructed 200 each 3  . glucose blood test strip Use as instructed to check blood sugar up to three times daily. E11.42 100 each 12  .  hydrochlorothiazide (HYDRODIURIL) 25 MG tablet TAKE 1 TABLET(25 MG) BY MOUTH DAILY 90 tablet 0  . Insulin Pen Needle (PEN NEEDLES) 30G X 8 MM MISC 1 Device by Does not apply route once a week. 90 each 3  . ipratropium (ATROVENT) 0.06 % nasal spray Place 2 sprays into both nostrils 3 (three) times daily. 15 mL 12  . Lancets MISC 1 Device by Does not apply route 2 (two) times daily. 180 each 3  . lisinopril (ZESTRIL) 10 MG tablet TAKE 1 TABLET BY MOUTH DAILY 90 tablet 1  . Multiple Vitamin (MULTIVITAMIN) capsule Take 1 capsule by mouth daily with lunch.     . naproxen sodium (ALEVE) 220 MG tablet Take 220 mg by mouth 2 (two) times daily as needed (for pain or headache).    . Omega-3 Fatty Acids (FISH OIL PO) Take 1 capsule by mouth daily.     . pantoprazole (PROTONIX) 40 MG tablet Take 1 tablet (40 mg total) by mouth 2 (two) times daily. **PLEASE SCHEDULE FOLLOW UP APPT** 60 tablet 0  . Polyvinyl Alcohol-Povidone (MURINE TEARS FOR DRY EYES OP) Place 1-2 drops into both eyes daily as needed (for dry eyes).    . Potassium 99 MG TABS Take 1 tablet by mouth daily.    . psyllium (METAMUCIL) 58.6 % powder Take 1 packet by mouth  at bedtime.     . Semaglutide,0.25 or 0.'5MG'$ /DOS, (OZEMPIC, 0.25 OR 0.5 MG/DOSE,) 2 MG/1.5ML SOPN Inject 0.5 mg into the skin once a week.    . tamsulosin (FLOMAX) 0.4 MG CAPS capsule TAKE 1 CAPSULE(0.4 MG) BY MOUTH DAILY 90 capsule 3  . traMADol (ULTRAM) 50 MG tablet TAKE 1 TABLET(50 MG) BY MOUTH EVERY 12 HOURS AS NEEDED. Further refills PCP [name] 60 tablet 1  . ezetimibe (ZETIA) 10 MG tablet Take 1 tablet (10 mg total) by mouth daily. 90 tablet 3  . rosuvastatin (CRESTOR) 10 MG tablet Take 1 tablet (10 mg total) by mouth daily. 90 tablet 3   No current facility-administered medications for this visit.    Allergies as of 01/19/2019 - Review Complete 01/19/2019  Allergen Reaction Noted  . Metformin and related Diarrhea 07/13/2018  . Azithromycin Other (See Comments)  07/07/2015    ROS:  General: Negative for anorexia, weight loss, fever, chills, fatigue, weakness. ENT: Negative for hoarseness, difficulty swallowing , nasal congestion. CV: Negative for chest pain, angina, palpitations, dyspnea on exertion, peripheral edema.  Respiratory: Negative for dyspnea at rest, dyspnea on exertion, cough, sputum, wheezing.  GI: See history of present illness. GU:  Negative for dysuria, hematuria, urinary incontinence, urinary frequency, nocturnal urination.  Endo: Negative for unusual weight change.    Physical Examination:   BP 130/64   Pulse (!) 41   Temp 98.2 F (36.8 C) (Oral)   Ht '6\' 1"'$  (1.854 m)   Wt 208 lb 9.6 oz (94.6 kg)   BMI 27.52 kg/m   General: Well-nourished, well-developed in no acute distress.  Eyes: No icterus. Conjunctivae pink. Lungs: Clear to auscultation bilaterally. Non-labored. Heart: Regular rate and rhythm, no murmurs rubs or gallops.  Abdomen: Bowel sounds are normal, nontender, nondistended, no hepatosplenomegaly or masses, no abdominal bruits or hernia , no rebound or guarding.   Extremities: No lower extremity edema. No clubbing or deformities. Neuro: Alert and oriented x 3.  Grossly intact. Skin: Warm and dry, no jaundice.   Psych: Alert and cooperative, normal mood and affect.  Labs:    Imaging Studies: No results found.  Assessment and Plan:   [name] [name] is a 79 y.o. y/o male who comes in today with a history of diarrhea that completely stopped when he went from pantoprazole twice a day to once a day.  The patient was taking the pantoprazole due to an esophageal ulcer.  The patient has not followed up in the past for this esophageal ulcer since having the EGD.  The patient will be set up for an upper endoscopy to look for any signs of reflux or strictures due to his significant past esophageal ulcer.  The area will also be inspected for possible neoplasm.  The patient has been explained the plan and agrees with  it.     [name], MD. [name]    Note: This dictation was prepared with Dragon dictation along with smaller phrase technology. Any transcriptional errors that result from this process are unintentional.

## 2019-01-21 ENCOUNTER — Ambulatory Visit (INDEPENDENT_AMBULATORY_CARE_PROVIDER_SITE_OTHER): Payer: Medicare Other | Admitting: Pharmacist

## 2019-01-21 DIAGNOSIS — E1165 Type 2 diabetes mellitus with hyperglycemia: Secondary | ICD-10-CM | POA: Diagnosis not present

## 2019-01-21 DIAGNOSIS — I25118 Atherosclerotic heart disease of native coronary artery with other forms of angina pectoris: Secondary | ICD-10-CM

## 2019-01-21 NOTE — Patient Instructions (Signed)
Visit Information  Goals Addressed            This Visit's Progress     Patient Stated   . "I want to work on my blood sugars" (pt-stated)       Current Barriers:  . Diabetes: uncontrolled; most recent [id number]; complicated by CAD, BPH o Notes that he has enrolled in a COVID antibody trial at [place] upcoming endoscopy per Dr. [name] . Current antihyperglycemic regimen: Ozempic 0.5 mg weekly (x2 weeks) glipizide XR 5 mg QAM o Has noted some constipation w/ dose increase, started back on Metamucil  o Has been unable to tolerate metformin, IR and ER, d/t diarrhea . Notes IMPROVEMENT in nocturia - previously going 2-4 times per night, now only once . Current glucose readings:  o 14 day average 166. Patient ONLY checking fasting readings.  . Current meal patterns: o Breakfast: 1 biscuit w/ scrambled egg; some type of meat; tomato juice, coffee (artificial sweetener) o Lunch: Variable; yesterday was on the road; had burger w/ half serving of fries, diet coke o Supper: salmon patty w/ cole slaw, 5-6 french fries; sometimes roasted chicken  o Snacks: occasionally has some popcorn  . Cardiovascular risk reduction: o Current hypertensive regimen: HCTZ 25 mg QAM, lisinopril 10 mg QAM o Current hyperlipidemia regimen: rosuvastatin 10 mg daily, ezetimibe 10 mg daily; has NOT had repeat lipid panel since starting rosuvastatin therapy in May per Dr. Rockey Situ, last LDL 70  Pharmacist Clinical Goal(s):  [name] Over the next 90 days, patient will work with PharmD and primary care provider to address optimized medication management  Interventions: . Discussed Dawn Phenomenon vs Somogyi Effect. Patient does not think he is having low blood sugars over night (though is taking glipizide XL w/ supper), but will try to check sugar overnight if he thinks about it when he gets up to go to the bathroom.  . Encouraged to start checking 2 hour post prandial readings. Discussed goal A1c, goal fasting glucose,  and goal post prandial glucose.  [name] Recommended at least 4 weeks on Ozempic 0.5 mg dose before adding additional pharmacotherapy or increasing dose. Patient will continue this dose for now. Will defer reapplying for Novo assistance, in case we need to apply for 1 mg pen strength.  . Follow up w/ PCP next week. Recommend updated lipid panel w/ next scheduled labs to ensure LDL at goal <70  Patient Self Care Activities:  . Patient will check blood glucose BID , document, and provide at future appointments . Patient will take medications as prescribed . Patient will report any questions or concerns to provider   Please see past updates related to this goal by clicking on the "Past Updates" button in the selected goal         The patient verbalized understanding of instructions provided today and declined a print copy of patient instruction materials.    Plan: - Scheduled follow up phone call 02/25/19 @ 9 am   [name], PharmD, [place], [place] [phone]

## 2019-01-21 NOTE — Chronic Care Management (AMB) (Signed)
Chronic Care Management   Follow Up Note   01/21/2019 Name: Verlin Uher MRN: [id number] DOB: 06-12-1940  Referred by: [name], [name] Reason for referral : Chronic Care Management (Medication Management)   Molly Maselli is a 79 y.o. year old male who is a primary care patient of [name], [name]. The CCM team was consulted for assistance with chronic disease management and care coordination needs.    Contacted patient for medication management review today.   Review of patient status, including review of consultants reports, relevant laboratory and other test results, and collaboration with appropriate care team members and the patient's provider was performed as part of comprehensive patient evaluation and provision of chronic care management services.    SDOH (Social Determinants of Health) screening performed today: Financial Strain . See Care Plan for related entries.   Outpatient Encounter Medications as of 01/21/2019  Medication Sig Note  . blood glucose meter kit and supplies KIT Dispense based on patient and insurance preference. Use up to four times daily as directed. (FOR ICD-9 250.00, 250.01).   [name] ezetimibe (ZETIA) 10 MG tablet Take 1 tablet (10 mg total) by mouth daily.   [name] glipiZIDE (GLUCOTROL XL) 5 MG 24 hr tablet Take 1 tablet (5 mg total) by mouth 2 (two) times daily. With food   . glucose blood (CONTOUR TEST) test strip 1 each by Other route 2 (two) times daily. Use as instructed   . glucose blood test strip Use as instructed to check blood sugar up to three times daily. E11.42   . hydrochlorothiazide (HYDRODIURIL) 25 MG tablet TAKE 1 TABLET(25 MG) BY MOUTH DAILY   . Insulin Pen Needle (PEN NEEDLES) 30G X 8 MM MISC 1 Device by Does not apply route once a week.   [name] ipratropium (ATROVENT) 0.06 % nasal spray Place 2 sprays into both nostrils 3 (three) times daily.   [name] lisinopril (ZESTRIL) 10 MG tablet TAKE 1 TABLET BY MOUTH DAILY   . Multiple Vitamin  (MULTIVITAMIN) capsule Take 1 capsule by mouth daily with lunch.    . naproxen sodium (ALEVE) 220 MG tablet Take 220 mg by mouth 2 (two) times daily as needed (for pain or headache).   . Semaglutide,0.25 or 0.'5MG'$ /DOS, (OZEMPIC, 0.25 OR 0.5 MG/DOSE,) 2 MG/1.5ML SOPN Inject 0.5 mg into the skin once a week.   [name] acetaminophen (TYLENOL) 650 MG CR tablet Take 650 mg by mouth 2 (two) times a day.   [name] aspirin EC 81 MG tablet Take 81 mg by mouth daily.    . carvedilol (COREG) 3.125 MG tablet TAKE 1 TABLET(3.125 MG) BY MOUTH TWICE DAILY   . fluticasone (FLONASE) 50 MCG/ACT nasal spray Place 2 sprays into both nostrils daily. (Patient taking differently: Place 2 sprays into both nostrils daily as needed for allergies. )   . Lancets MISC 1 Device by Does not apply route 2 (two) times daily.   . Omega-3 Fatty Acids (FISH OIL PO) Take 1 capsule by mouth daily.    . pantoprazole (PROTONIX) 40 MG tablet Take 1 tablet (40 mg total) by mouth 2 (two) times daily. **PLEASE SCHEDULE FOLLOW UP APPT**   . Polyvinyl Alcohol-Povidone (MURINE TEARS FOR DRY EYES OP) Place 1-2 drops into both eyes daily as needed (for dry eyes).   . Potassium 99 MG TABS Take 1 tablet by mouth daily. 10/22/2018: QOD   . psyllium (METAMUCIL) 58.6 % powder Take 1 packet by mouth at bedtime.  07/13/2018: PRN  . rosuvastatin (CRESTOR)  10 MG tablet Take 1 tablet (10 mg total) by mouth daily.   . tamsulosin (FLOMAX) 0.4 MG CAPS capsule TAKE 1 CAPSULE(0.4 MG) BY MOUTH DAILY   . traMADol (ULTRAM) 50 MG tablet TAKE 1 TABLET(50 MG) BY MOUTH EVERY 12 HOURS AS NEEDED. Further refills PCP [name]    No facility-administered encounter medications on file as of 01/21/2019.     Goals Addressed            This Visit's Progress     Patient Stated   . "I want to work on my blood sugars" (pt-stated)       Current Barriers:  . Diabetes: uncontrolled; most recent G6Y 6.9%; complicated by CAD, BPH o Notes that he has enrolled in a COVID antibody  trial at [place] upcoming endoscopy per Dr. [name] . Current antihyperglycemic regimen: Ozempic 0.5 mg weekly (x2 weeks) glipizide XR 5 mg QAM o Has noted some constipation w/ dose increase, started back on Metamucil  o Has been unable to tolerate metformin, IR and ER, d/t diarrhea . Notes IMPROVEMENT in nocturia - previously going 2-4 times per night, now only once . Current glucose readings:  o 14 day average 166. Patient ONLY checking fasting readings.  . Current meal patterns: o Breakfast: 1 biscuit w/ scrambled egg; some type of meat; tomato juice, coffee (artificial sweetener) o Lunch: Variable; yesterday was on the road; had burger w/ half serving of fries, diet coke o Supper: salmon patty w/ cole slaw, 5-6 french fries; sometimes roasted chicken  o Snacks: occasionally has some popcorn  . Cardiovascular risk reduction: o Current hypertensive regimen: HCTZ 25 mg QAM, lisinopril 10 mg QAM o Current hyperlipidemia regimen: rosuvastatin 10 mg daily, ezetimibe 10 mg daily; has NOT had repeat lipid panel since starting rosuvastatin therapy in May per Dr. Rockey Situ, last LDL 70  Pharmacist Clinical Goal(s):  [name] Over the next 90 days, patient will work with PharmD and primary care provider to address optimized medication management  Interventions: . Discussed Dawn Phenomenon vs Somogyi Effect. Patient does not think he is having low blood sugars over night (though is taking glipizide XL w/ supper), but will try to check sugar overnight if he thinks about it when he gets up to go to the bathroom.  . Encouraged to start checking 2 hour post prandial readings. Discussed goal A1c, goal fasting glucose, and goal post prandial glucose.  [name] Recommended at least 4 weeks on Ozempic 0.5 mg dose before adding additional pharmacotherapy or increasing dose. Patient will continue this dose for now. Will defer reapplying for Novo assistance, in case we need to apply for 1 mg pen strength.  . Follow up w/  PCP next week. Recommend updated lipid panel w/ next scheduled labs to ensure LDL at goal <70  Patient Self Care Activities:  . Patient will check blood glucose BID , document, and provide at future appointments . Patient will take medications as prescribed . Patient will report any questions or concerns to provider   Please see past updates related to this goal by clicking on the "Past Updates" button in the selected goal          Plan: - Scheduled follow up phone call 02/25/19 @ 9 am   [name], PharmD, [place], [place] Pharmacist [employer] [phone]

## 2019-01-22 NOTE — Progress Notes (Signed)
Agree with plan. [name], NP Will order lipid panel next week

## 2019-01-22 NOTE — Progress Notes (Signed)
Virtual Visit via Video Note  I connected with@  on 01/27/19 at 11:00 AM EST by a video enabled telemedicine application and verified that I am speaking with the correct person using two identifiers.  Location patient: home Location provider:work or home office Persons participating in the virtual visit: patient, provider  I discussed the limitations of evaluation and management by telemedicine and the availability of in person appointments. The patient expressed understanding and agreed to proceed.   HPI: Follow up  HTN- around 138/80. Compliant with medication. No CP, SOB. Takes potassium OTC every other day, now taking every day.   DM- concerned ozempic is not working yet.Started on increase dose 01/21/19,   FBG 170, 136, 181.  Takes glipizide '5mg'$  XL BID with meals.  Discussed dawn phenomenon vs somogyi effect with Catie.    GERD- reduced pantoprozole to once per day and diarrhea resolved.   Pending EGD with Dr [name] 02/02/19  HLD- compliant with medication.   ROS: See pertinent positives and negatives per HPI.  Past Medical History:  Diagnosis Date  . Aortic insufficiency    a. noted on TTE 2015  . Arthritis   . CAD (coronary artery disease)    a. remote PCI in 1991 and 2005; b. MV 3/15: old inferior MI, no ischemia, LVEF 50%, slight inferior wall hypokniesis  . Chicken pox   . Colon polyps    4 pre-cancerous   . Diverticulitis   . DM type 2 (diabetes mellitus, type 2) ([place])   . Family history of adverse reaction to anesthesia   . GERD (gastroesophageal reflux disease)   . Heart murmur   . History of kidney stones   . Hyperlipidemia   . Hypertension   . Kidney stones   . Melanoma ([place]) 1980   Resected from his back  . Mitral regurgitation    a. noted on TTE 2015  . Myocardial infarction ([place])   . Systolic dysfunction    a. TTE 2015: EF  50-55%, mild global hypokinesis, mild to moderate aortic sclerosis, mild aortic insufficiency, mild to moderate mitral  regurgitation, mild tricuspid regurgitation, moderately dilated left atrium, mildly dilated right ventricle, PASP 31 mmHg    Past Surgical History:  Procedure Laterality Date  . CHOLECYSTECTOMY  2012  . COLONOSCOPY     in 2003 with polyp removed and leak anastomosis had to have open abdominal surgery   . CORONARY ANGIOPLASTY WITH STENT PLACEMENT  1991 & 2005  . CYSTOSCOPY/URETEROSCOPY/HOLMIUM LASER/STENT PLACEMENT Right 01/02/2018   Procedure: CYSTOSCOPY/URETEROSCOPY/HOLMIUM LASER/STENT PLACEMENT;  Surgeon: [name], MD;  Location: [facility] ORS;  Service: Urology;  Laterality: Right;  . ESOPHAGOGASTRODUODENOSCOPY (EGD) WITH PROPOFOL N/A 11/19/2016   Procedure: ESOPHAGOGASTRODUODENOSCOPY (EGD) WITH PROPOFOL;  Surgeon: [name], MD;  Location: [facility] ENDOSCOPY;  Service: Endoscopy;  Laterality: N/A;  . LITHOTRIPSY  2015  . MELANOMA EXCISION  1980   malignant  . NERVE SURGERY  2015   ulna nerve  . TONSILLECTOMY  1945  . WISDOM TOOTH EXTRACTION      Family History  Problem Relation Age of Onset  . Hyperlipidemia Mother   . Hypertension Mother   . Heart disease Mother   . Diabetes Mother   . Heart attack Mother   . Colon cancer Father   . Lung cancer Father   . Kidney cancer Father        malignant capsulated kidney tumor  . Diabetes Father   . Liver cancer Father   . Bladder Cancer Neg Hx   .  Prostate cancer Neg Hx     SOCIAL HX: smoker   Current Outpatient Medications:  .  acetaminophen (TYLENOL) 650 MG CR tablet, Take 650 mg by mouth 2 (two) times a day., Disp: , Rfl:  .  aspirin EC 81 MG tablet, Take 81 mg by mouth daily. , Disp: , Rfl:  .  blood glucose meter kit and supplies KIT, Dispense based on patient and insurance preference. Use up to four times daily as directed. (FOR ICD-9 250.00, 250.01)., Disp: 1 each, Rfl: 0 .  carvedilol (COREG) 3.125 MG tablet, TAKE 1 TABLET(3.125 MG) BY MOUTH TWICE DAILY, Disp: 180 tablet, Rfl: 1 .  fluticasone (FLONASE) 50 MCG/ACT  nasal spray, Place 2 sprays into both nostrils daily. (Patient taking differently: Place 2 sprays into both nostrils daily as needed for allergies. ), Disp: 16 g, Rfl: 6 .  glipiZIDE (GLUCOTROL XL) 5 MG 24 hr tablet, Take 1 tablet (5 mg total) by mouth 2 (two) times daily. With food, Disp: 180 tablet, Rfl: 3 .  glucose blood (CONTOUR TEST) test strip, 1 each by Other route 2 (two) times daily. Use as instructed, Disp: 200 each, Rfl: 3 .  glucose blood test strip, Use as instructed to check blood sugar up to three times daily. E11.42, Disp: 100 each, Rfl: 12 .  hydrochlorothiazide (HYDRODIURIL) 25 MG tablet, TAKE 1 TABLET(25 MG) BY MOUTH DAILY, Disp: 90 tablet, Rfl: 0 .  Insulin Pen Needle (PEN NEEDLES) 30G X 8 MM MISC, 1 Device by Does not apply route once a week., Disp: 90 each, Rfl: 3 .  ipratropium (ATROVENT) 0.06 % nasal spray, Place 2 sprays into both nostrils 3 (three) times daily., Disp: 15 mL, Rfl: 12 .  Lancets MISC, 1 Device by Does not apply route 2 (two) times daily., Disp: 180 each, Rfl: 3 .  lisinopril (ZESTRIL) 10 MG tablet, TAKE 1 TABLET BY MOUTH DAILY, Disp: 90 tablet, Rfl: 1 .  Multiple Vitamin (MULTIVITAMIN) capsule, Take 1 capsule by mouth daily with lunch. , Disp: , Rfl:  .  Omega-3 Fatty Acids (FISH OIL PO), Take 1 capsule by mouth daily. , Disp: , Rfl:  .  pantoprazole (PROTONIX) 40 MG tablet, Take 1 tablet (40 mg total) by mouth 2 (two) times daily. **PLEASE SCHEDULE FOLLOW UP APPT** (Patient taking differently: Take 40 mg by mouth daily. **PLEASE SCHEDULE FOLLOW UP APPT**), Disp: 60 tablet, Rfl: 0 .  Polyvinyl Alcohol-Povidone (MURINE TEARS FOR DRY EYES OP), Place 1-2 drops into both eyes daily as needed (for dry eyes)., Disp: , Rfl:  .  psyllium (METAMUCIL) 58.6 % powder, Take 1 packet by mouth at bedtime. , Disp: , Rfl:  .  Semaglutide,0.25 or 0.'5MG'$ /DOS, (OZEMPIC, 0.25 OR 0.5 MG/DOSE,) 2 MG/1.5ML SOPN, Inject 0.5 mg into the skin once a week., Disp: , Rfl:  .  tamsulosin  (FLOMAX) 0.4 MG CAPS capsule, TAKE 1 CAPSULE(0.4 MG) BY MOUTH DAILY, Disp: 90 capsule, Rfl: 3 .  traMADol (ULTRAM) 50 MG tablet, TAKE 1 TABLET(50 MG) BY MOUTH EVERY 12 HOURS AS NEEDED. Further refills PCP [name], Disp: 60 tablet, Rfl: 1 .  ezetimibe (ZETIA) 10 MG tablet, Take 1 tablet (10 mg total) by mouth daily., Disp: 90 tablet, Rfl: 3 .  naproxen sodium (ALEVE) 220 MG tablet, Take 220 mg by mouth 2 (two) times daily as needed (for pain or headache)., Disp: , Rfl:  .  rosuvastatin (CRESTOR) 10 MG tablet, Take 1 tablet (10 mg total) by mouth daily., Disp: 90 tablet, Rfl:  3  EXAM:  VITALS per patient if applicable: Vitals:   11/73/56 1104  BP: 138/75   BP Readings from Last 3 Encounters:  01/27/19 138/75  01/19/19 130/64  10/01/18 140/70    GENERAL: alert, oriented, appears well and in no acute distress  HEENT: atraumatic, conjunttiva clear, no obvious abnormalities on inspection of external nose and ears  NECK: normal movements of the head and neck  LUNGS: on inspection no signs of respiratory distress, breathing rate appears normal, no obvious gross SOB, gasping or wheezing  CV: no obvious cyanosis  MS: moves all visible extremities without noticeable abnormality  PSYCH/NEURO: pleasant and cooperative, no obvious depression or anxiety, speech and thought processing grossly intact  ASSESSMENT AND PLAN:  Discussed the following assessment and plan:  Controlled type 2 diabetes mellitus with complication, without long-term current use of insulin (HCC) - Plan: Hemoglobin A1c, Lipid panel-future  Essential hypertension - Plan: Comprehensive metabolic panel-FUTURE  Gastroesophageal reflux disease, unspecified whether esophagitis present Problem List Items Addressed This Visit      Cardiovascular and Mediastinum   Essential hypertension    Blood pressure acceptable for age. Advised stop OTC potassium as I would feel more comfortable prescribing potassium chloride if  patient needed it while being on hydrochlorothiazide.  Also advised he needs to have electrolytes and kidney function done every 3 months with his A1c since he is on HCTZ.  Patient verbalized understanding       Relevant Orders   Comprehensive metabolic panel-FUTURE     Digestive   GERD (gastroesophageal reflux disease)    Following with Dr. [name], pending EGD.  Will        Endocrine   Controlled type 2 diabetes mellitus with complication, without long-term current use of insulin ([place]) - Primary    Pending a1c. Will stay on current regimen for now.       Relevant Orders   Hemoglobin A1c   Lipid panel-future      -we discussed possible serious and likely etiologies, options for evaluation and workup, limitations of telemedicine visit vs in person visit, treatment, treatment risks and precautions. Pt prefers to treat via telemedicine empirically rather then risking or undertaking an in person visit at this moment. Patient agrees to seek prompt in person care if worsening, new symptoms arise, or if is not improving with treatment.   I discussed the assessment and treatment plan with the patient. The patient was provided an opportunity to ask questions and all were answered. The patient agreed with the plan and demonstrated an understanding of the instructions.   The patient was advised to call back or seek an in-person evaluation if the symptoms worsen or if the condition fails to improve as anticipated.   [name], [name]

## 2019-01-26 ENCOUNTER — Telehealth: Payer: Self-pay | Admitting: Cardiovascular Disease

## 2019-01-26 NOTE — Telephone Encounter (Signed)
   [facility] Pre-operative Risk Assessment    Request for surgical clearance:  1. What type of surgery is being performed? EGD  2. When is this surgery scheduled? 02/02/19  3. What type of clearance is required (medical clearance vs. Pharmacy clearance to hold med vs. Both)? both  4. Are there any medications that need to be held prior to surgery and how long? Not stated  5. Practice name and name of physician performing surgery? [facility] Dr. [name]  6. What is your office phone number [phone]   7.   What is your office fax number [phone]  8.   Anesthesia type (None, local, MAC, general) ? Not noted   Marykay Lex 01/26/2019, 4:01 PM  _________________________________________________________________   (provider comments below)

## 2019-01-27 ENCOUNTER — Encounter: Payer: Self-pay | Admitting: Family

## 2019-01-27 ENCOUNTER — Other Ambulatory Visit: Payer: Self-pay

## 2019-01-27 ENCOUNTER — Telehealth: Payer: Self-pay | Admitting: Family

## 2019-01-27 ENCOUNTER — Ambulatory Visit (INDEPENDENT_AMBULATORY_CARE_PROVIDER_SITE_OTHER): Payer: Medicare Other | Admitting: Family

## 2019-01-27 VITALS — BP 138/75 | Ht 73.0 in | Wt 200.0 lb

## 2019-01-27 DIAGNOSIS — I10 Essential (primary) hypertension: Secondary | ICD-10-CM

## 2019-01-27 DIAGNOSIS — E118 Type 2 diabetes mellitus with unspecified complications: Secondary | ICD-10-CM

## 2019-01-27 DIAGNOSIS — K219 Gastro-esophageal reflux disease without esophagitis: Secondary | ICD-10-CM

## 2019-01-27 NOTE — Telephone Encounter (Signed)
   Primary Cardiologist: Ida Rogue, MD  Chart reviewed as part of pre-operative protocol coverage. I called and spoke with patient today and he is doing well from a cardiac standpoint. No chest pain, shortness of breath, palpitations, lightheadedness, dizziness, syncope, orthopnea, PND, or edema. He is able to climb a flight of steps and walk around the block without any anginal symptoms. Given past medical history and time since last visit, based on ACC/AHA guidelines, Kiev Sonier would be at acceptable risk for the planned procedure without further cardiovascular testing.   I will route this recommendation to the requesting party via Epic fax function and remove from pre-op pool.  Please call with questions.  Darreld Mclean, PA-C 01/27/2019, 8:44 AM

## 2019-01-27 NOTE — Assessment & Plan Note (Addendum)
Blood pressure acceptable for age. Advised stop OTC potassium as I would feel more comfortable prescribing potassium chloride if patient needed it while being on hydrochlorothiazide.  Also advised he needs to have electrolytes and kidney function done every 3 months with his A1c since he is on HCTZ.  Patient verbalized understanding

## 2019-01-27 NOTE — Assessment & Plan Note (Addendum)
Pending a1c. Will stay on current regimen for now.

## 2019-01-27 NOTE — Assessment & Plan Note (Signed)
Following with Dr. [name], pending EGD.  Will

## 2019-01-27 NOTE — Telephone Encounter (Signed)
Lm on vm to call office to set up fasting labs and a 2m follow up.

## 2019-01-29 ENCOUNTER — Other Ambulatory Visit: Payer: Self-pay

## 2019-01-29 ENCOUNTER — Other Ambulatory Visit
Admission: RE | Admit: 2019-01-29 | Discharge: 2019-01-29 | Disposition: A | Discharge status: Home / Self Care | Payer: Medicare Other | Source: Ambulatory Visit | Attending: Gastroenterology | Admitting: Gastroenterology

## 2019-01-29 DIAGNOSIS — Z01812 Encounter for preprocedural laboratory examination: Secondary | ICD-10-CM | POA: Insufficient documentation

## 2019-01-29 DIAGNOSIS — Z20822 Contact with and (suspected) exposure to covid-19: Secondary | ICD-10-CM | POA: Insufficient documentation

## 2019-01-30 ENCOUNTER — Encounter: Payer: Self-pay | Admitting: Family

## 2019-01-30 LAB — SARS CORONAVIRUS 2 (TAT 6-24 HRS): SARS Coronavirus 2: NEGATIVE

## 2019-02-02 ENCOUNTER — Encounter
Admission: RE | Disposition: A | Discharge status: Home / Self Care | Payer: Self-pay | Source: Home / Self Care | Attending: Gastroenterology

## 2019-02-02 ENCOUNTER — Other Ambulatory Visit: Payer: Self-pay | Admitting: Gastroenterology

## 2019-02-02 ENCOUNTER — Encounter: Payer: Self-pay | Admitting: Gastroenterology

## 2019-02-02 ENCOUNTER — Ambulatory Visit: Payer: Medicare Other | Admitting: Anesthesiology

## 2019-02-02 ENCOUNTER — Ambulatory Visit
Admission: RE | Admit: 2019-02-02 | Discharge: 2019-02-02 | Disposition: A | Discharge status: Home / Self Care | Payer: Medicare Other | Attending: Gastroenterology | Admitting: Gastroenterology

## 2019-02-02 ENCOUNTER — Other Ambulatory Visit: Payer: Self-pay

## 2019-02-02 DIAGNOSIS — F17200 Nicotine dependence, unspecified, uncomplicated: Secondary | ICD-10-CM | POA: Diagnosis not present

## 2019-02-02 DIAGNOSIS — G4700 Insomnia, unspecified: Secondary | ICD-10-CM | POA: Diagnosis not present

## 2019-02-02 DIAGNOSIS — Z7982 Long term (current) use of aspirin: Secondary | ICD-10-CM | POA: Insufficient documentation

## 2019-02-02 DIAGNOSIS — K219 Gastro-esophageal reflux disease without esophagitis: Secondary | ICD-10-CM | POA: Diagnosis not present

## 2019-02-02 DIAGNOSIS — I2510 Atherosclerotic heart disease of native coronary artery without angina pectoris: Secondary | ICD-10-CM | POA: Insufficient documentation

## 2019-02-02 DIAGNOSIS — Z794 Long term (current) use of insulin: Secondary | ICD-10-CM | POA: Insufficient documentation

## 2019-02-02 DIAGNOSIS — Z09 Encounter for follow-up examination after completed treatment for conditions other than malignant neoplasm: Secondary | ICD-10-CM | POA: Diagnosis not present

## 2019-02-02 DIAGNOSIS — Z79899 Other long term (current) drug therapy: Secondary | ICD-10-CM | POA: Diagnosis not present

## 2019-02-02 DIAGNOSIS — K449 Diaphragmatic hernia without obstruction or gangrene: Secondary | ICD-10-CM | POA: Insufficient documentation

## 2019-02-02 DIAGNOSIS — E119 Type 2 diabetes mellitus without complications: Secondary | ICD-10-CM | POA: Diagnosis not present

## 2019-02-02 DIAGNOSIS — I10 Essential (primary) hypertension: Secondary | ICD-10-CM | POA: Diagnosis not present

## 2019-02-02 DIAGNOSIS — M1990 Unspecified osteoarthritis, unspecified site: Secondary | ICD-10-CM | POA: Diagnosis not present

## 2019-02-02 DIAGNOSIS — I509 Heart failure, unspecified: Secondary | ICD-10-CM | POA: Insufficient documentation

## 2019-02-02 DIAGNOSIS — Z955 Presence of coronary angioplasty implant and graft: Secondary | ICD-10-CM | POA: Insufficient documentation

## 2019-02-02 DIAGNOSIS — Z13810 Encounter for screening for upper gastrointestinal disorder: Secondary | ICD-10-CM | POA: Diagnosis not present

## 2019-02-02 DIAGNOSIS — I110 Hypertensive heart disease with heart failure: Secondary | ICD-10-CM | POA: Diagnosis not present

## 2019-02-02 DIAGNOSIS — K209 Esophagitis, unspecified without bleeding: Secondary | ICD-10-CM | POA: Diagnosis not present

## 2019-02-02 DIAGNOSIS — E785 Hyperlipidemia, unspecified: Secondary | ICD-10-CM | POA: Insufficient documentation

## 2019-02-02 HISTORY — PX: ESOPHAGOGASTRODUODENOSCOPY (EGD) WITH PROPOFOL: SHX5813

## 2019-02-02 LAB — GLUCOSE, CAPILLARY: Glucose-Capillary: 174 mg/dL — ABNORMAL HIGH (ref 70–99)

## 2019-02-02 SURGERY — ESOPHAGOGASTRODUODENOSCOPY (EGD) WITH PROPOFOL
Anesthesia: General

## 2019-02-02 MED ORDER — PROPOFOL 10 MG/ML IV BOLUS
INTRAVENOUS | Status: DC | PRN
  Administered 2019-02-02: 70 mg via INTRAVENOUS
  Administered 2019-02-02: 20 mg via INTRAVENOUS

## 2019-02-02 MED ORDER — LIDOCAINE HCL (PF) 2 % IJ SOLN
INTRAMUSCULAR | Status: DC | PRN
  Administered 2019-02-02: 100 mg via INTRADERMAL

## 2019-02-02 MED ORDER — SODIUM CHLORIDE 0.9 % IV SOLN: INTRAVENOUS | Status: DC

## 2019-02-02 NOTE — Interval H&P Note (Signed)
History and Physical Interval Note:  02/02/2019 7:57 AM  [name] [name]  has presented today for surgery, with the diagnosis of GERD K21.9.  The various methods of treatment have been discussed with the patient and family. After consideration of risks, benefits and other options for treatment, the patient has consented to  Procedure(s): ESOPHAGOGASTRODUODENOSCOPY (EGD) WITH PROPOFOL (N/A) as a surgical intervention.  The patient's history has been reviewed, patient examined, no change in status, stable for surgery.  I have reviewed the patient's chart and labs.  Questions were answered to the patient's satisfaction.     Lurlie Wigen [employer]

## 2019-02-02 NOTE — Transfer of Care (Signed)
Immediate Anesthesia Transfer of Care Note  Patient: [name] [name]  Procedure(s) Performed: ESOPHAGOGASTRODUODENOSCOPY (EGD) WITH PROPOFOL (N/A )  Patient Location: PACU  Anesthesia Type:General  Level of Consciousness: awake, alert  and oriented  Airway & Oxygen Therapy: Patient Spontanous Breathing  Post-op Assessment: Report given to RN and Post -op Vital signs reviewed and stable  Post vital signs: Reviewed and stable  Last Vitals:  Vitals Value Taken Time  BP 140/91 02/02/19 1142  Temp    Pulse 65 02/02/19 1142  Resp 17 02/02/19 1142  SpO2 93 % 02/02/19 1142  Vitals shown include unvalidated device data.  Last Pain:  Vitals:   02/02/19 0803  TempSrc: Temporal  PainSc: 0-No pain         Complications: No apparent anesthesia complications

## 2019-02-02 NOTE — Op Note (Signed)
[facility] Patient Name: [name] [name] Procedure Date: 02/02/2019 11:07 AM MRN: [id number] Account #: [id number] Date of Birth: September 02, 1940 Admit Type: Outpatient Age: 79 Room: [facility] ENDO ROOM 4 Gender: Male Note Status: Finalized Procedure:             Upper GI endoscopy Indications:           Follow-up of esophagitis Providers:             [name] MD, MD Referring MD:          Dion Body (Referring MD) Medicines:             Propofol per Anesthesia Complications:         No immediate complications. Procedure:             Pre-Anesthesia Assessment:                        - Prior to the procedure, a History and Physical was                         performed, and patient medications and allergies were                         reviewed. The patient's tolerance of previous                         anesthesia was also reviewed. The risks and benefits                         of the procedure and the sedation options and risks                         were discussed with the patient. All questions were                         answered, and informed consent was obtained. Prior                         Anticoagulants: The patient has taken no previous                         anticoagulant or antiplatelet agents. ASA Grade                         Assessment: II - A patient with mild systemic disease.                         After reviewing the risks and benefits, the patient                         was deemed in satisfactory condition to undergo the                         procedure.                        After obtaining informed consent, the endoscope was  passed under direct vision. Throughout the procedure,                         the patient's blood pressure, pulse, and oxygen                         saturations were monitored continuously. The Endoscope                         was introduced through the mouth, and  advanced to the                         second part of duodenum. The upper GI endoscopy was                         accomplished without difficulty. The patient tolerated                         the procedure well. Findings:      A small hiatal hernia was present.      The stomach was normal.      The examined duodenum was normal. Impression:            - Small hiatal hernia.                        - Normal stomach.                        - Normal examined duodenum.                        - No specimens collected. Recommendation:        - Discharge patient to home.                        - Resume previous diet.                        - Continue present medications. Procedure Code(s):     --- Professional ---                        [phone], Esophagogastroduodenoscopy, flexible,                         transoral; diagnostic, including collection of                         specimen(s) by brushing or washing, when performed                         (separate procedure) Diagnosis Code(s):     --- Professional ---                        K20.90, Esophagitis, unspecified without bleeding CPT copyright 2019 American Medical Association. All rights reserved. The codes documented in this report are preliminary and upon coder review may  be revised to meet current compliance requirements. [name] MD, MD 02/02/2019 11:40:12 AM This report has been signed electronically. Number of Addenda: 0 Note Initiated On: 02/02/2019 11:07 AM Estimated Blood Loss:  Estimated blood loss: none.      [facility]

## 2019-02-02 NOTE — Anesthesia Postprocedure Evaluation (Signed)
Anesthesia Post Note  Patient: [name] [name]  Procedure(s) Performed: ESOPHAGOGASTRODUODENOSCOPY (EGD) WITH PROPOFOL (N/A )  Patient location during evaluation: Endoscopy Anesthesia Type: General Level of consciousness: awake and alert Pain management: pain level controlled Vital Signs Assessment: post-procedure vital signs reviewed and stable Respiratory status: spontaneous breathing and respiratory function stable Cardiovascular status: stable Anesthetic complications: no     Last Vitals:  Vitals:   02/02/19 1202 02/02/19 1212  BP: (!) 143/89 (!) 142/87  Pulse: (!) 54 (!) 51  Resp: 18 19  Temp:    SpO2: 96% 97%    Last Pain:  Vitals:   02/02/19 1212  TempSrc:   PainSc: 0-No pain                 KEPHART,WILLIAM K

## 2019-02-02 NOTE — Anesthesia Preprocedure Evaluation (Addendum)
Anesthesia Evaluation  Patient identified by MRN, date of birth, ID band Patient awake    Reviewed: Allergy & Precautions, H&P , NPO status , Patient's Chart, lab work & pertinent test results  Airway Mallampati: II  TM Distance: >3 FB Neck ROM: full    Dental  (+) Chipped   Pulmonary Current Smoker and Patient abstained from smoking.,           Cardiovascular hypertension, (-) angina+ CAD, + Past MI, + Cardiac Stents (>1 year ago) and +CHF  (-) dysrhythmias + Valvular Problems/Murmurs   Echo 03/25/17:  - Left ventricle: The cavity size was at the upper limits of   normal. Wall thickness was increased in a pattern of mild LVH.   Systolic function was normal. The estimated ejection fraction was   in the range of 50% to 55%. Wall motion was normal; there were no   regional wall motion abnormalities. Doppler parameters are   consistent with abnormal left ventricular relaxation (grade 1   diastolic dysfunction). - Aortic valve: Cusp separation was reduced. Transvalvular velocity   was increased. There was mild stenosis. Peak velocity (S): 263   cm/s. Mean gradient (S): 14 mm Hg. - Ascending aorta: The ascending aorta was upper normal in size to   mildly dilated. - Mitral valve: Mildly calcified annulus. - Left atrium: The atrium was mildly dilated. - Right ventricle: The cavity size was normal. Wall thickness was   normal. Systolic function was normal.   Neuro/Psych negative neurological ROS  negative psych ROS   GI/Hepatic Neg liver ROS, PUD, GERD  Controlled,  Endo/Other  diabetes  Renal/GU negative Renal ROS  negative genitourinary   Musculoskeletal   Abdominal   Peds  Hematology negative hematology ROS (+)   Anesthesia Other Findings Past Medical History: No date: Aortic insufficiency     Comment:  a. noted on TTE 2015 No date: Arthritis No date: CAD (coronary artery disease)     Comment:  a. remote PCI in  1991 and 2005; b. MV 3/15: old inferior              MI, no ischemia, LVEF 50%, slight inferior wall               hypokniesis No date: Chicken pox No date: Colon polyps     Comment:  4 pre-cancerous  No date: Diverticulitis No date: DM type 2 (diabetes mellitus, type 2) (HCC) No date: Family history of adverse reaction to anesthesia No date: GERD (gastroesophageal reflux disease) No date: Heart murmur No date: History of kidney stones No date: Hyperlipidemia No date: Hypertension No date: Kidney stones 1980: Melanoma ([place])     Comment:  Resected from his back No date: Mitral regurgitation     Comment:  a. noted on TTE 2015 No date: Myocardial infarction ([place]) No date: Systolic dysfunction     Comment:  a. TTE 2015: EF  50-55%, mild global hypokinesis, mild               to moderate aortic sclerosis, mild aortic insufficiency,               mild to moderate mitral regurgitation, mild tricuspid               regurgitation, moderately dilated left atrium, mildly               dilated right ventricle, PASP 31 mmHg  Past Surgical History: 2012: CHOLECYSTECTOMY No date: COLONOSCOPY  Comment:  in 2003 with polyp removed and leak anastomosis had to               have open abdominal surgery  1991 & 2005: [place] 01/02/2018: CYSTOSCOPY/URETEROSCOPY/HOLMIUM LASER/STENT PLACEMENT;  Right     Comment:  Procedure: CYSTOSCOPY/URETEROSCOPY/HOLMIUM LASER/STENT               PLACEMENT;  Surgeon: [name], MD;  Location:               [facility] ORS;  Service: Urology;  Laterality: Right; 11/19/2016: ESOPHAGOGASTRODUODENOSCOPY (EGD) WITH PROPOFOL; N/A     Comment:  Procedure: ESOPHAGOGASTRODUODENOSCOPY (EGD) WITH               PROPOFOL;  Surgeon: [name], MD;  Location: [facility]               ENDOSCOPY;  Service: Endoscopy;  Laterality: N/A; 2015: LITHOTRIPSY 1980: MELANOMA EXCISION     Comment:  malignant 2015: NERVE SURGERY     Comment:  ulna  nerve 1945: TONSILLECTOMY No date: WISDOM TOOTH EXTRACTION  BMI    Body Mass Index: 26.39 kg/m      Reproductive/Obstetrics negative OB ROS                            Anesthesia Physical Anesthesia Plan  ASA: III  Anesthesia Plan: General   Post-op Pain Management:    Induction:   PONV Risk Score and Plan: Propofol infusion and TIVA  Airway Management Planned: Natural Airway and Nasal Cannula  Additional Equipment:   Intra-op Plan:   Post-operative Plan:   Informed Consent: I have reviewed the patients History and Physical, chart, labs and discussed the procedure including the risks, benefits and alternatives for the proposed anesthesia with the patient or authorized representative who has indicated his/her understanding and acceptance.     Dental Advisory Given  Plan Discussed with: Anesthesiologist  Anesthesia Plan Comments:        Anesthesia Quick Evaluation

## 2019-02-03 ENCOUNTER — Encounter: Payer: Self-pay | Admitting: *Deleted

## 2019-02-10 ENCOUNTER — Other Ambulatory Visit: Payer: Self-pay

## 2019-02-10 ENCOUNTER — Other Ambulatory Visit (INDEPENDENT_AMBULATORY_CARE_PROVIDER_SITE_OTHER): Payer: Medicare Other

## 2019-02-10 DIAGNOSIS — I10 Essential (primary) hypertension: Secondary | ICD-10-CM

## 2019-02-10 DIAGNOSIS — E118 Type 2 diabetes mellitus with unspecified complications: Secondary | ICD-10-CM

## 2019-02-10 LAB — COMPREHENSIVE METABOLIC PANEL
ALT: 56 U/L — ABNORMAL HIGH (ref 0–53)
AST: 52 U/L — ABNORMAL HIGH (ref 0–37)
Albumin: 4.2 g/dL (ref 3.5–5.2)
Alkaline Phosphatase: 66 U/L (ref 39–117)
BUN: 17 mg/dL (ref 6–23)
CO2: 28 mEq/L (ref 19–32)
Calcium: 10 mg/dL (ref 8.4–10.5)
Chloride: 99 mEq/L (ref 96–112)
Creatinine, Ser: 0.77 mg/dL (ref 0.40–1.50)
GFR: 97.44 mL/min (ref >60.00)
Glucose, Bld: 161 mg/dL — ABNORMAL HIGH (ref 70–99)
Potassium: 4 mEq/L (ref 3.5–5.1)
Sodium: 136 mEq/L (ref 135–145)
Total Bilirubin: 1.3 mg/dL — ABNORMAL HIGH (ref 0.2–1.2)
Total Protein: 7.3 g/dL (ref 6.0–8.3)

## 2019-02-10 LAB — LIPID PANEL
Cholesterol: 120 mg/dL (ref 0–200)
HDL: 39 mg/dL — ABNORMAL LOW (ref >39.00)
NonHDL: 80.8
Total CHOL/HDL Ratio: 3
Triglycerides: 215 mg/dL — ABNORMAL HIGH (ref 0.0–149.0)
VLDL: 43 mg/dL — ABNORMAL HIGH (ref 0.0–40.0)

## 2019-02-10 LAB — LDL CHOLESTEROL, DIRECT: Direct LDL: 55 mg/dL

## 2019-02-10 LAB — HEMOGLOBIN A1C: Hgb A1c MFr Bld: 7.9 % — ABNORMAL HIGH (ref 4.6–6.5)

## 2019-02-11 NOTE — Addendum Note (Signed)
Addended by: [name] on: 02/11/2019 09:19 PM   Modules accepted: Orders

## 2019-02-12 ENCOUNTER — Encounter: Payer: Self-pay | Admitting: Family

## 2019-02-22 DIAGNOSIS — H43813 Vitreous degeneration, bilateral: Secondary | ICD-10-CM | POA: Diagnosis not present

## 2019-02-25 ENCOUNTER — Ambulatory Visit (INDEPENDENT_AMBULATORY_CARE_PROVIDER_SITE_OTHER): Payer: Medicare Other | Admitting: Pharmacist

## 2019-02-25 DIAGNOSIS — E118 Type 2 diabetes mellitus with unspecified complications: Secondary | ICD-10-CM

## 2019-02-25 NOTE — Chronic Care Management (AMB) (Signed)
Chronic Care Management   Follow Up Note   02/25/2019 Name: [name] [name] MRN: [id number] DOB: February 17, 1940  Referred by: [name] Hawthorne, [name] Reason for referral : Chronic Care Management (Medication Management)   [name] [name] is a 79 y.o. year old male who is a primary care patient of [name] Hawthorne, [name]. The CCM team was consulted for assistance with chronic disease management and care coordination needs.    Contacted patient for medication management review.   Review of patient status, including review of consultants reports, relevant laboratory and other test results, and collaboration with appropriate care team members and the patient's provider was performed as part of comprehensive patient evaluation and provision of chronic care management services.    SDOH (Social Determinants of Health) screening performed today: None. See Care Plan for related entries.   Outpatient Encounter Medications as of 02/25/2019  Medication Sig Note  . Semaglutide,0.25 or 0.'5MG'$ /DOS, (OZEMPIC, 0.25 OR 0.5 MG/DOSE,) 2 MG/1.5ML SOPN Inject 0.5 mg into the skin once a week. 02/25/2019: Taking 0.5 mg weekly  . acetaminophen (TYLENOL) 650 MG CR tablet Take 650 mg by mouth 2 (two) times a day.   [name] aspirin EC 81 MG tablet Take 81 mg by mouth daily.    . blood glucose meter kit and supplies KIT Dispense based on patient and insurance preference. Use up to four times daily as directed. (FOR ICD-9 250.00, 250.01).   . carvedilol (COREG) 3.125 MG tablet TAKE 1 TABLET(3.125 MG) BY MOUTH TWICE DAILY   . ezetimibe (ZETIA) 10 MG tablet Take 1 tablet (10 mg total) by mouth daily.   . fluticasone (FLONASE) 50 MCG/ACT nasal spray Place 2 sprays into both nostrils daily. (Patient taking differently: Place 2 sprays into both nostrils daily as needed for allergies. )   . glipiZIDE (GLUCOTROL XL) 5 MG 24 hr tablet Take 1 tablet (5 mg total) by mouth 2 (two) times daily. With food   . glucose blood (CONTOUR TEST)  test strip 1 each by Other route 2 (two) times daily. Use as instructed   . glucose blood test strip Use as instructed to check blood sugar up to three times daily. E11.42   . hydrochlorothiazide (HYDRODIURIL) 25 MG tablet TAKE 1 TABLET(25 MG) BY MOUTH DAILY   . Insulin Pen Needle (PEN NEEDLES) 30G X 8 MM MISC 1 Device by Does not apply route once a week.   [name] ipratropium (ATROVENT) 0.06 % nasal spray Place 2 sprays into both nostrils 3 (three) times daily.   . Lancets MISC 1 Device by Does not apply route 2 (two) times daily.   [name] lisinopril (ZESTRIL) 10 MG tablet TAKE 1 TABLET BY MOUTH DAILY   . Multiple Vitamin (MULTIVITAMIN) capsule Take 1 capsule by mouth daily with lunch.    . naproxen sodium (ALEVE) 220 MG tablet Take 220 mg by mouth 2 (two) times daily as needed (for pain or headache).   . Omega-3 Fatty Acids (FISH OIL PO) Take 1 capsule by mouth daily.    . pantoprazole (PROTONIX) 40 MG tablet TAKE 1 TABLET BY MOUTH TWICE DAILY   . Polyvinyl Alcohol-Povidone (MURINE TEARS FOR DRY EYES OP) Place 1-2 drops into both eyes daily as needed (for dry eyes).   . psyllium (METAMUCIL) 58.6 % powder Take 1 packet by mouth at bedtime.  07/13/2018: PRN  . rosuvastatin (CRESTOR) 10 MG tablet Take 1 tablet (10 mg total) by mouth daily.   . tamsulosin (FLOMAX) 0.4 MG CAPS capsule TAKE 1 CAPSULE(0.4  MG) BY MOUTH DAILY   . traMADol (ULTRAM) 50 MG tablet TAKE 1 TABLET(50 MG) BY MOUTH EVERY 12 HOURS AS NEEDED. Further refills PCP [name]    No facility-administered encounter medications on file as of 02/25/2019.     Objective:   Goals Addressed            This Visit's Progress     Patient Stated   . "I want to work on my blood sugars" (pt-stated)       Current Barriers:  . Diabetes: uncontrolled; most recent P6U 8.6%; complicated by CAD, BPH . Current antihyperglycemic regimen: Ozempic 0.5 mg weekly (x3 or 4 weeks), glipizide XR 5 mg BID currently   o Has been unable to tolerate metformin,  IR and ER, d/t diarrhea . Notes IMPROVEMENT in nocturia . Current glucose readings:  o Fastings: 150-180s; Pre suppers: 150-200s o Reads me his weekly averages since November, and numbers appear to be trending up . Cardiovascular risk reduction: o Current hypertensive regimen: HCTZ 25 mg QAM, lisinopril 10 mg QAM o Current hyperlipidemia regimen: rosuvastatin 10 mg daily, ezetimibe 10 mg daily; LDL at goal <70  Pharmacist Clinical Goal(s):  [name] Over the next 90 days, patient will work with PharmD and primary care provider to address optimized medication management  Interventions: . For now, recommend continuing current regimen of Ozempic 0.5 mg + glipizide XL 5 mg BID. May be worth future consideration of checking c peptide to ensure beta cell function, given concern for benefit of GLP1.  . Moving forward, will consider dose titration of Ozempic vs addition of SGLT2.   Patient Self Care Activities:  . Patient will check blood glucose BID , document, and provide at future appointments . Patient will take medications as prescribed . Patient will report any questions or concerns to provider   Please see past updates related to this goal by clicking on the "Past Updates" button in the selected goal          Plan:  - Scheduled f/u call 03/18/19  [name], PharmD, BCACP, [place] Pharmacist [place] [phone]

## 2019-02-25 NOTE — Patient Instructions (Signed)
Visit Information  Goals Addressed            This Visit's Progress     Patient Stated   . "I want to work on my blood sugars" (pt-stated)       Current Barriers:  . Diabetes: uncontrolled; most recent [id number]; complicated by CAD, BPH . Current antihyperglycemic regimen: Ozempic 0.5 mg weekly (x3 or 4 weeks), glipizide XR 5 mg BID currently   o Has been unable to tolerate metformin, IR and ER, d/t diarrhea . Notes IMPROVEMENT in nocturia . Current glucose readings:  o Fastings: 150-180s; Pre suppers: 150-200s o Reads me his weekly averages since November, and numbers appear to be trending up . Cardiovascular risk reduction: o Current hypertensive regimen: HCTZ 25 mg QAM, lisinopril 10 mg QAM o Current hyperlipidemia regimen: rosuvastatin 10 mg daily, ezetimibe 10 mg daily; LDL at goal <70  Pharmacist Clinical Goal(s):  [name] Over the next 90 days, patient will work with PharmD and primary care provider to address optimized medication management  Interventions: . For now, recommend continuing current regimen of Ozempic 0.5 mg + glipizide XL 5 mg BID. May be worth future consideration of checking c peptide to ensure beta cell function, given concern for benefit of GLP1.  . Moving forward, will consider dose titration of Ozempic vs addition of SGLT2.   Patient Self Care Activities:  . Patient will check blood glucose BID , document, and provide at future appointments . Patient will take medications as prescribed . Patient will report any questions or concerns to provider   Please see past updates related to this goal by clicking on the "Past Updates" button in the selected goal         The patient verbalized understanding of instructions provided today and declined a print copy of patient instruction materials.      Plan:  - Scheduled f/u call 03/18/19  [name], PharmD, BCACP, CPP Clinical Pharmacist [place] [phone]

## 2019-02-26 ENCOUNTER — Other Ambulatory Visit: Payer: Self-pay

## 2019-02-26 ENCOUNTER — Ambulatory Visit (INDEPENDENT_AMBULATORY_CARE_PROVIDER_SITE_OTHER): Payer: Medicare Other | Admitting: Family

## 2019-02-26 ENCOUNTER — Encounter: Payer: Self-pay | Admitting: Family

## 2019-02-26 ENCOUNTER — Other Ambulatory Visit: Payer: Self-pay | Admitting: Family

## 2019-02-26 VITALS — BP 130/78 | Ht 73.0 in | Wt 200.0 lb

## 2019-02-26 DIAGNOSIS — J0100 Acute maxillary sinusitis, unspecified: Secondary | ICD-10-CM | POA: Diagnosis not present

## 2019-02-26 DIAGNOSIS — I10 Essential (primary) hypertension: Secondary | ICD-10-CM

## 2019-02-26 MED ORDER — AMOXICILLIN-POT CLAVULANATE 875-125 MG PO TABS: 1.0000 | ORAL_TABLET | Freq: Two times a day (BID) | ORAL | 0 refills | Status: AC

## 2019-02-26 NOTE — Assessment & Plan Note (Signed)
Patient well-appearing, he is afebrile.  Discussed limitations of video visit.  Suspect bacterial sinusitis as symptoms are present predominantly on the left side.  Also concerned in regards to his dental pain.   Advised that if he does have a dental abscess, that can rapidly become severe, complicated.  Will cover him with Augmentin and if he does not feel  improvement within the next 24 to 48 hours, advised him to seek urgent care and also call his dentist.  Patient verbalized understanding of all

## 2019-02-26 NOTE — Progress Notes (Signed)
Virtual Visit via Video Note  I connected with@  on 02/26/19 at  2:30 PM EST by a video enabled telemedicine application and verified that I am speaking with the correct person using two identifiers.  Location patient: home Location provider:work  Persons participating in the virtual visit: patient, provider  I discussed the limitations of evaluation and management by telemedicine and the availability of in person appointments. The patient expressed understanding and agreed to proceed.   HPI: CC: left sided sinus tenderness, nasal congestion worsened over the past couple of weeks.    No CP, facial swelling,  sob, cough, fever, sore throat, ha , vision changes.  Feels like his sinuses are hurting so bad it is bothering his left upper molar.  Painful to eat on left side.  Using saline and sudafed she with relief of accident he did x-rays yesterday Seen dentist yesterday for back left upper molar which was 'sensitive'. After xrays, she felt a sinus infection. Xrs done with dentist.   ROS: See pertinent positives and negatives per HPI.  Past Medical History:  Diagnosis Date  . Aortic insufficiency    a. noted on TTE 2015  . Arthritis   . CAD (coronary artery disease)    a. remote PCI in 1991 and 2005; b. MV 3/15: old inferior MI, no ischemia, LVEF 50%, slight inferior wall hypokniesis  . Chicken pox   . Colon polyps    4 pre-cancerous   . Diverticulitis   . DM type 2 (diabetes mellitus, type 2) ([place])   . Family history of adverse reaction to anesthesia   . GERD (gastroesophageal reflux disease)   . Heart murmur   . History of kidney stones   . Hyperlipidemia   . Hypertension   . Kidney stones   . Melanoma ([place]) 1980   Resected from his back  . Mitral regurgitation    a. noted on TTE 2015  . Myocardial infarction ([place])   . Systolic dysfunction    a. TTE 2015: EF  50-55%, mild global hypokinesis, mild to moderate aortic sclerosis, mild aortic insufficiency, mild to moderate  mitral regurgitation, mild tricuspid regurgitation, moderately dilated left atrium, mildly dilated right ventricle, PASP 31 mmHg    Past Surgical History:  Procedure Laterality Date  . CHOLECYSTECTOMY  2012  . COLONOSCOPY     in 2003 with polyp removed and leak anastomosis had to have open abdominal surgery   . CORONARY ANGIOPLASTY WITH STENT PLACEMENT  1991 & 2005  . CYSTOSCOPY/URETEROSCOPY/HOLMIUM LASER/STENT PLACEMENT Right 01/02/2018   Procedure: CYSTOSCOPY/URETEROSCOPY/HOLMIUM LASER/STENT PLACEMENT;  Surgeon: [name], MD;  Location: [facility] ORS;  Service: Urology;  Laterality: Right;  . ESOPHAGOGASTRODUODENOSCOPY (EGD) WITH PROPOFOL N/A 11/19/2016   Procedure: ESOPHAGOGASTRODUODENOSCOPY (EGD) WITH PROPOFOL;  Surgeon: [name], MD;  Location: [facility] ENDOSCOPY;  Service: Endoscopy;  Laterality: N/A;  . ESOPHAGOGASTRODUODENOSCOPY (EGD) WITH PROPOFOL N/A 02/02/2019   Procedure: ESOPHAGOGASTRODUODENOSCOPY (EGD) WITH PROPOFOL;  Surgeon: [name], MD;  Location: [facility] ENDOSCOPY;  Service: Endoscopy;  Laterality: N/A;  . LITHOTRIPSY  2015  . MELANOMA EXCISION  1980   malignant  . NERVE SURGERY  2015   ulna nerve  . TONSILLECTOMY  1945  . WISDOM TOOTH EXTRACTION      Family History  Problem Relation Age of Onset  . Hyperlipidemia Mother   . Hypertension Mother   . Heart disease Mother   . Diabetes Mother   . Heart attack Mother   . Colon cancer Father   . Lung cancer Father   .  Kidney cancer Father        malignant capsulated kidney tumor  . Diabetes Father   . Liver cancer Father   . Bladder Cancer Neg Hx   . Prostate cancer Neg Hx     SOCIAL HX: smoker   Current Outpatient Medications:  .  acetaminophen (TYLENOL) 650 MG CR tablet, Take 650 mg by mouth 2 (two) times a day., Disp: , Rfl:  .  aspirin EC 81 MG tablet, Take 81 mg by mouth daily. , Disp: , Rfl:  .  blood glucose meter kit and supplies KIT, Dispense based on patient and insurance preference. Use up to  four times daily as directed. (FOR ICD-9 250.00, 250.01)., Disp: 1 each, Rfl: 0 .  carvedilol (COREG) 3.125 MG tablet, TAKE 1 TABLET(3.125 MG) BY MOUTH TWICE DAILY, Disp: 180 tablet, Rfl: 1 .  fluticasone (FLONASE) 50 MCG/ACT nasal spray, Place 2 sprays into both nostrils daily. (Patient taking differently: Place 2 sprays into both nostrils daily as needed for allergies. ), Disp: 16 g, Rfl: 6 .  glipiZIDE (GLUCOTROL XL) 5 MG 24 hr tablet, Take 1 tablet (5 mg total) by mouth 2 (two) times daily. With food, Disp: 180 tablet, Rfl: 3 .  glucose blood (CONTOUR TEST) test strip, 1 each by Other route 2 (two) times daily. Use as instructed, Disp: 200 each, Rfl: 3 .  glucose blood test strip, Use as instructed to check blood sugar up to three times daily. E11.42, Disp: 100 each, Rfl: 12 .  hydrochlorothiazide (HYDRODIURIL) 25 MG tablet, TAKE 1 TABLET(25 MG) BY MOUTH DAILY, Disp: 90 tablet, Rfl: 0 .  Insulin Pen Needle (PEN NEEDLES) 30G X 8 MM MISC, 1 Device by Does not apply route once a week., Disp: 90 each, Rfl: 3 .  ipratropium (ATROVENT) 0.06 % nasal spray, Place 2 sprays into both nostrils 3 (three) times daily., Disp: 15 mL, Rfl: 12 .  Lancets MISC, 1 Device by Does not apply route 2 (two) times daily., Disp: 180 each, Rfl: 3 .  lisinopril (ZESTRIL) 10 MG tablet, TAKE 1 TABLET BY MOUTH DAILY, Disp: 90 tablet, Rfl: 1 .  Multiple Vitamin (MULTIVITAMIN) capsule, Take 1 capsule by mouth daily with lunch. , Disp: , Rfl:  .  naproxen sodium (ALEVE) 220 MG tablet, Take 220 mg by mouth 2 (two) times daily as needed (for pain or headache)., Disp: , Rfl:  .  Omega-3 Fatty Acids (FISH OIL PO), Take 1 capsule by mouth daily. , Disp: , Rfl:  .  pantoprazole (PROTONIX) 40 MG tablet, TAKE 1 TABLET BY MOUTH TWICE DAILY, Disp: 180 tablet, Rfl: 0 .  Polyvinyl Alcohol-Povidone (MURINE TEARS FOR DRY EYES OP), Place 1-2 drops into both eyes daily as needed (for dry eyes)., Disp: , Rfl:  .  psyllium (METAMUCIL) 58.6 %  powder, Take 1 packet by mouth at bedtime. , Disp: , Rfl:  .  Semaglutide,0.25 or 0.'5MG'$ /DOS, (OZEMPIC, 0.25 OR 0.5 MG/DOSE,) 2 MG/1.5ML SOPN, Inject 0.5 mg into the skin once a week., Disp: , Rfl:  .  tamsulosin (FLOMAX) 0.4 MG CAPS capsule, TAKE 1 CAPSULE(0.4 MG) BY MOUTH DAILY, Disp: 90 capsule, Rfl: 3 .  traMADol (ULTRAM) 50 MG tablet, TAKE 1 TABLET(50 MG) BY MOUTH EVERY 12 HOURS AS NEEDED. Further refills PCP [name], Disp: 60 tablet, Rfl: 1 .  amoxicillin-clavulanate (AUGMENTIN) 875-125 MG tablet, Take 1 tablet by mouth 2 (two) times daily for 7 days., Disp: 14 tablet, Rfl: 0 .  ezetimibe (ZETIA) 10 MG tablet,  Take 1 tablet (10 mg total) by mouth daily., Disp: 90 tablet, Rfl: 3 .  rosuvastatin (CRESTOR) 10 MG tablet, Take 1 tablet (10 mg total) by mouth daily., Disp: 90 tablet, Rfl: 3  EXAM:  VITALS per patient if applicable:  GENERAL: alert, oriented, appears well and in no acute distress  HEENT: atraumatic, conjunttiva clear, no obvious abnormalities on inspection of external nose and ears.  No facial swelling or erythema appreciated over video  NECK: normal movements of the head and neck  LUNGS: on inspection no signs of respiratory distress, breathing rate appears normal, no obvious gross SOB, gasping or wheezing  CV: no obvious cyanosis  MS: moves all visible extremities without noticeable abnormality  PSYCH/NEURO: pleasant and cooperative, no obvious depression or anxiety, speech and thought processing grossly intact  ASSESSMENT AND PLAN:  Discussed the following assessment and plan:  Essential hypertension - Plan: Comprehensive metabolic panel  Acute non-recurrent maxillary sinusitis  -we discussed possible serious and likely etiologies, options for evaluation and workup, limitations of telemedicine visit vs in person visit, treatment, treatment risks and precautions. Pt prefers to treat via telemedicine empirically rather then risking or undertaking an in person  visit at this moment. Patient agrees to seek prompt in person care if worsening, new symptoms arise, or if is not improving with treatment.   I discussed the assessment and treatment plan with the patient. The patient was provided an opportunity to ask questions and all were answered. The patient agreed with the plan and demonstrated an understanding of the instructions.   The patient was advised to call back or seek an in-person evaluation if the symptoms worsen or if the condition fails to improve as anticipated.   [name], [name]

## 2019-02-28 NOTE — Progress Notes (Signed)
Agree with plan. Myrtle Haller, NP  

## 2019-03-01 ENCOUNTER — Other Ambulatory Visit: Payer: Medicare Other

## 2019-03-01 DIAGNOSIS — H40003 Preglaucoma, unspecified, bilateral: Secondary | ICD-10-CM | POA: Diagnosis not present

## 2019-03-03 ENCOUNTER — Other Ambulatory Visit
Admission: RE | Admit: 2019-03-03 | Discharge: 2019-03-03 | Disposition: A | Discharge status: Home / Self Care | Payer: Medicare Other | Source: Ambulatory Visit | Attending: Family | Admitting: Family

## 2019-03-03 DIAGNOSIS — I10 Essential (primary) hypertension: Secondary | ICD-10-CM | POA: Insufficient documentation

## 2019-03-03 LAB — COMPREHENSIVE METABOLIC PANEL
ALT: 57 U/L — ABNORMAL HIGH (ref 0–44)
AST: 56 U/L — ABNORMAL HIGH (ref 15–41)
Albumin: 3.8 g/dL (ref 3.5–5.0)
Alkaline Phosphatase: 52 U/L (ref 38–126)
Anion gap: 9 (ref 5–15)
BUN: 20 mg/dL (ref 8–23)
CO2: 26 mmol/L (ref 22–32)
Calcium: 9.1 mg/dL (ref 8.9–10.3)
Chloride: 102 mmol/L (ref 98–111)
Creatinine, Ser: 0.78 mg/dL (ref 0.61–1.24)
GFR calc Af Amer: >60 mL/min (ref >60)
GFR calc non Af Amer: >60 mL/min (ref >60)
Glucose, Bld: 183 mg/dL — ABNORMAL HIGH (ref 70–99)
Potassium: 4 mmol/L (ref 3.5–5.1)
Sodium: 137 mmol/L (ref 135–145)
Total Bilirubin: 1.4 mg/dL — ABNORMAL HIGH (ref 0.3–1.2)
Total Protein: 6.7 g/dL (ref 6.5–8.1)

## 2019-03-05 ENCOUNTER — Other Ambulatory Visit: Payer: Self-pay | Admitting: Family

## 2019-03-06 ENCOUNTER — Encounter: Payer: Self-pay | Admitting: Family

## 2019-03-12 ENCOUNTER — Other Ambulatory Visit: Payer: Self-pay | Admitting: Family

## 2019-03-12 DIAGNOSIS — G8929 Other chronic pain: Secondary | ICD-10-CM

## 2019-03-12 NOTE — Telephone Encounter (Signed)
Refill request for tamadol, last seen 02-25-18, last filled 03-07-99.  Please advise.

## 2019-03-14 NOTE — Telephone Encounter (Signed)
I looked up patient on [place] Controlled Substances Reporting System and saw no activity that raised concern of inappropriate use.   

## 2019-03-18 ENCOUNTER — Ambulatory Visit (INDEPENDENT_AMBULATORY_CARE_PROVIDER_SITE_OTHER): Payer: Medicare Other | Admitting: Pharmacist

## 2019-03-18 ENCOUNTER — Other Ambulatory Visit: Payer: Self-pay | Admitting: Pharmacy Technician

## 2019-03-18 DIAGNOSIS — I25118 Atherosclerotic heart disease of native coronary artery with other forms of angina pectoris: Secondary | ICD-10-CM

## 2019-03-18 DIAGNOSIS — E1165 Type 2 diabetes mellitus with hyperglycemia: Secondary | ICD-10-CM

## 2019-03-18 NOTE — Patient Instructions (Signed)
Visit Information  Goals Addressed            This Visit's Progress     Patient Stated   . "I want to work on my blood sugars" (pt-stated)       [place] (see longtitudinal plan of care for additional care plan information)  Current Barriers:  . Diabetes: uncontrolled; most recent [id number]; complicated by CAD, BPH o Discussed his concerns with anecdotally having more uncontrolled sugars since COVID diagnosis. Notes he has talked w/ the [place] office in [place] and that they are unaware of any studies looking into this, but are supposed to get back with him if anything does start o Reports that Augmentin helped resolve sinus infection, completely resolved tooth pain, though does have some continued congestion. Notes he did develop a yeast infection, and that he always seems to develop a genitourinary yeast infection after amoxicillin therapy o Notes that he has an appointment w/ Dr. [name] [facility] for elevated liver enzymes next week, 03/25/19 . Current antihyperglycemic regimen: Ozempic 0.5 mg weekly; glipizide XR 5 mg BID currently   o Has been unable to tolerate metformin, IR and ER, d/t diarrhea. Notes he developed diarrhea after a few years of tolerating metformin, but diarrhea did improve after metformin was discontinued. . Current glucose readings:  o Fastings: 160-188 o [place]: 155;  o 14 day average: 183 . Cardiovascular risk reduction: o Current hypertensive regimen: HCTZ 25 mg QAM, lisinopril 10 mg QAM o Current hyperlipidemia regimen: rosuvastatin 10 mg daily, ezetimibe 10 mg daily; LDL at goal <70  Pharmacist Clinical Goal(s):  [name] Over the next 90 days, patient will work with PharmD and primary care provider to address optimized medication management  Interventions: . Reviewed options of increasing Ozempic vs adding SGLT2. Reviewed side effects, long term benefits. Discussed potential benefit of GLP1 in fatty liver disease (though data with other agents in class than  Ozempic). Collaboratively decided to wait until after patient has talked with Dr. [name] next week.  . For now, will start process for [employer] patient assistance program application; will decide next week whether to apply for 0.5 mg vs 1 mg pen strength. Will collaborate w/ CPhT to mail patient portion of application to him. He is aware that we will need copy of 2020 tax return. We have new 2021 insurance card on file.   Patient Self Care Activities:  . Patient will check blood glucose BID , document, and provide at future appointments . Patient will take medications as prescribed . Patient will report any questions or concerns to provider   Please see past updates related to this goal by clicking on the "Past Updates" button in the selected goal         Patient verbalizes understanding of instructions provided today.   Plan:  - Will collaborate w/ patient, provider, and CPhT as above - Will await call back from patient next week   [name], PharmD, [place], [place] Pharmacist [place] [phone]

## 2019-03-18 NOTE — Chronic Care Management (AMB) (Signed)
Chronic Care Management   Follow Up Note   03/18/2019 Name: [name] [name] MRN: [id number] DOB: October 18, 1940  Referred by: [name], [name] Reason for referral : Chronic Care Management (Medication Management)   [name] [name] is a 79 y.o. year old male who is a primary care patient of [name], [name]. The CCM team was consulted for assistance with chronic disease management and care coordination needs.    Contacted patient for medication management review.   Review of patient status, including review of consultants reports, relevant laboratory and other test results, and collaboration with appropriate care team members and the patient's provider was performed as part of comprehensive patient evaluation and provision of chronic care management services.    SDOH (Social Determinants of Health) assessments performed: Yes See Care Plan activities for detailed interventions related to SDOH)  SDOH Interventions     Most Recent Value  SDOH Interventions  SDOH Interventions for the Following Domains  Financial Strain  Financial Strain Interventions  Other (Comment) [medication assistance application]       Outpatient Encounter Medications as of 03/18/2019  Medication Sig Note  . glipiZIDE (GLUCOTROL XL) 5 MG 24 hr tablet Take 1 tablet (5 mg total) by mouth 2 (two) times daily. With food   . Semaglutide,0.25 or 0.'5MG'$ /DOS, (OZEMPIC, 0.25 OR 0.5 MG/DOSE,) 2 MG/1.5ML SOPN Inject 0.5 mg into the skin once a week. 02/25/2019: Taking 0.5 mg weekly  . acetaminophen (TYLENOL) 650 MG CR tablet Take 650 mg by mouth 2 (two) times a day.   [name] aspirin EC 81 MG tablet Take 81 mg by mouth daily.    . blood glucose meter kit and supplies KIT Dispense based on patient and insurance preference. Use up to four times daily as directed. (FOR ICD-9 250.00, 250.01).   . carvedilol (COREG) 3.125 MG tablet TAKE 1 TABLET(3.125 MG) BY MOUTH TWICE DAILY   . ezetimibe (ZETIA) 10 MG tablet Take 1 tablet  (10 mg total) by mouth daily.   . fluticasone (FLONASE) 50 MCG/ACT nasal spray Place 2 sprays into both nostrils daily. (Patient taking differently: Place 2 sprays into both nostrils daily as needed for allergies. )   . glucose blood (CONTOUR TEST) test strip 1 each by Other route 2 (two) times daily. Use as instructed   . glucose blood test strip Use as instructed to check blood sugar up to three times daily. E11.42   . hydrochlorothiazide (HYDRODIURIL) 25 MG tablet TAKE 1 TABLET(25 MG) BY MOUTH DAILY   . Insulin Pen Needle (PEN NEEDLES) 30G X 8 MM MISC 1 Device by Does not apply route once a week.   [name] ipratropium (ATROVENT) 0.06 % nasal spray Place 2 sprays into both nostrils 3 (three) times daily.   . Lancets MISC 1 Device by Does not apply route 2 (two) times daily.   [name] lisinopril (ZESTRIL) 10 MG tablet TAKE 1 TABLET BY MOUTH DAILY   . Multiple Vitamin (MULTIVITAMIN) capsule Take 1 capsule by mouth daily with lunch.    . naproxen sodium (ALEVE) 220 MG tablet Take 220 mg by mouth 2 (two) times daily as needed (for pain or headache).   . Omega-3 Fatty Acids (FISH OIL PO) Take 1 capsule by mouth daily.    . pantoprazole (PROTONIX) 40 MG tablet TAKE 1 TABLET BY MOUTH TWICE DAILY   . Polyvinyl Alcohol-Povidone (MURINE TEARS FOR DRY EYES OP) Place 1-2 drops into both eyes daily as needed (for dry eyes).   . psyllium (METAMUCIL) 58.6 %  powder Take 1 packet by mouth at bedtime.  07/13/2018: PRN  . rosuvastatin (CRESTOR) 10 MG tablet Take 1 tablet (10 mg total) by mouth daily.   . tamsulosin (FLOMAX) 0.4 MG CAPS capsule TAKE 1 CAPSULE(0.4 MG) BY MOUTH DAILY   . traMADol (ULTRAM) 50 MG tablet TAKE 1 TABLET BY MOUTH EVERY 12 HOURS AS NEEDED    No facility-administered encounter medications on file as of 03/18/2019.     Objective:   Goals Addressed            This Visit's Progress     Patient Stated   . "I want to work on my blood sugars" (pt-stated)       [place] (see longtitudinal  plan of care for additional care plan information)  Current Barriers:  . Diabetes: uncontrolled; most recent U8Q 9.1%; complicated by CAD, BPH o Discussed his concerns with anecdotally having more uncontrolled sugars since COVID diagnosis. Notes he has talked w/ the [facility] office in [place] and that they are unaware of any studies looking into this, but are supposed to get back with him if anything does start o Reports that Augmentin helped resolve sinus infection, completely resolved tooth pain, though does have some continued congestion. Notes he did develop a yeast infection, and that he always seems to develop a genitourinary yeast infection after amoxicillin therapy o Notes that he has an appointment w/ Dr. [name] GI for elevated liver enzymes next week, 03/25/19 . Current antihyperglycemic regimen: Ozempic 0.5 mg weekly; glipizide XR 5 mg BID currently   o Has been unable to tolerate metformin, IR and ER, d/t diarrhea. Notes he developed diarrhea after a few years of tolerating metformin, but diarrhea did improve after metformin was discontinued. . Current glucose readings:  o [place]: 160-188 o [place]: 155;  o 14 day average: 183 . Cardiovascular risk reduction: o Current hypertensive regimen: HCTZ 25 mg QAM, lisinopril 10 mg QAM o Current hyperlipidemia regimen: rosuvastatin 10 mg daily, ezetimibe 10 mg daily; LDL at goal <70  Pharmacist Clinical Goal(s):  [name] Over the next 90 days, patient will work with PharmD and primary care provider to address optimized medication management  Interventions: . Reviewed options of increasing Ozempic vs adding SGLT2. Reviewed side effects, long term benefits. Discussed potential benefit of GLP1 in fatty liver disease (though data with other agents in class than Ozempic). Collaboratively decided to wait until after patient has talked with Dr. [name] next week.  . For now, will start process for [employer] patient assistance program application; will decide  next week whether to apply for 0.5 mg vs 1 mg pen strength. Will collaborate w/ CPhT to mail patient portion of application to him. He is aware that we will need copy of 2020 tax return. We have new 2021 insurance card on file.   Patient Self Care Activities:  . Patient will check blood glucose BID , document, and provide at future appointments . Patient will take medications as prescribed . Patient will report any questions or concerns to provider   Please see past updates related to this goal by clicking on the "Past Updates" button in the selected goal          Plan:  - Will collaborate w/ patient, provider, and CPhT as above - Will await call back from patient next week   [name], PharmD, [place], [place] Pharmacist [facility] [phone]

## 2019-03-18 NOTE — Patient Outreach (Signed)
[facility]) Care Management  03/18/2019  Zahair Cali 1941-01-05 [id number]                                       Medication Assistance Referral  Referral From: [facility] Embedded RPh Catie T.   Medication/Company: [employer] Patient application portion:  Education officer, museum portion:  N/A embedded pharmacist to have signed while in clnic to [name], [place] Provider address/fax verified via: Office website     Follow up:  Will follow up with patient in 15-20 business days to confirm application(s) have been received.  Wesam Gearhart P. Chastin Riesgo, [place]  [phone]

## 2019-03-19 NOTE — Progress Notes (Signed)
Agree with plan and patient seeing Dr [name] prior to any changes to DM regimen at this time. [name], NP

## 2019-03-25 ENCOUNTER — Ambulatory Visit: Payer: Medicare Other | Admitting: Gastroenterology

## 2019-03-25 ENCOUNTER — Other Ambulatory Visit: Payer: Self-pay

## 2019-03-25 ENCOUNTER — Other Ambulatory Visit
Admission: RE | Admit: 2019-03-25 | Discharge: 2019-03-25 | Disposition: A | Discharge status: Home / Self Care | Payer: Medicare Other | Source: Ambulatory Visit | Attending: Gastroenterology | Admitting: Gastroenterology

## 2019-03-25 ENCOUNTER — Encounter: Payer: Self-pay | Admitting: Gastroenterology

## 2019-03-25 VITALS — BP 102/72 | HR 79 | Temp 98.0°F | Ht 73.0 in | Wt 206.2 lb

## 2019-03-25 DIAGNOSIS — R748 Abnormal levels of other serum enzymes: Secondary | ICD-10-CM | POA: Insufficient documentation

## 2019-03-25 LAB — HEPATIC FUNCTION PANEL
ALT: 59 U/L — ABNORMAL HIGH (ref 0–44)
AST: 63 U/L — ABNORMAL HIGH (ref 15–41)
Albumin: 3.9 g/dL (ref 3.5–5.0)
Alkaline Phosphatase: 54 U/L (ref 38–126)
Bilirubin, Direct: 0.2 mg/dL (ref 0.0–0.2)
Indirect Bilirubin: 1.1 mg/dL — ABNORMAL HIGH (ref 0.3–0.9)
Total Bilirubin: 1.3 mg/dL — ABNORMAL HIGH (ref 0.3–1.2)
Total Protein: 7.3 g/dL (ref 6.5–8.1)

## 2019-03-25 LAB — HEPATITIS PANEL, ACUTE
HCV Ab: NONREACTIVE
Hep A IgM: NONREACTIVE
Hep B C IgM: NONREACTIVE
Hepatitis B Surface Ag: NONREACTIVE

## 2019-03-25 LAB — HEPATITIS A ANTIBODY, TOTAL: hep A Total Ab: NONREACTIVE

## 2019-03-25 LAB — GAMMA GT: GGT: 58 U/L — ABNORMAL HIGH (ref 7–50)

## 2019-03-25 LAB — HEPATITIS B SURFACE ANTIBODY,QUALITATIVE: Hep B S Ab: NONREACTIVE

## 2019-03-25 NOTE — Progress Notes (Signed)
Primary Care Physician: [name], [name]  Primary Gastroenterologist:  Dr. [name]  Chief Complaint  Patient presents with  . Elevated Hepatic Enzymes    HPI: [name] [name] is a 79 y.o. male here with a history of see me in the past for abnormal liver enzymes.  The patient's liver enzymes had returned back to normal at different times and now are back to be elevated.  Component     Latest Ref Rng & Units 09/09/2016 01/08/2017 03/13/2017 07/01/2017  AST     15 - 41 U/L 27 32    ALT     0 - 44 U/L 25 27    Alkaline Phosphatase     38 - 126 U/L 35 (L) 43    Total Bilirubin     0.3 - 1.2 mg/dL 0.8 1.1     Component     Latest Ref Rng & Units 12/22/2017 03/02/2018 06/03/2018 08/06/2018  AST     15 - 41 U/L  28 35   ALT     0 - 44 U/L  30 43   Alkaline Phosphatase     38 - 126 U/L  31 (L) 43   Total Bilirubin     0.3 - 1.2 mg/dL  0.9 1.2    Component     Latest Ref Rng & Units 02/10/2019 03/03/2019  AST     15 - 41 U/L 52 (H) 56 (H)  ALT     0 - 44 U/L 56 (H) 57 (H)  Alkaline Phosphatase     38 - 126 U/L 66 52  Total Bilirubin     0.3 - 1.2 mg/dL 1.3 (H) 1.4 (H)    As shown above the patient's liver enzymes were normal from August 2018 up until January of this year.  The patient had an MRI in July of last year that showed findings consistent with fatty liver. The patient was questioning whether his recent infection with Covid had anything to do with his abnormal liver enzymes.  He denies any unexplained weight loss fevers chills nausea vomiting or weight gain.  He does report being tired and that his diabetes has been less well controlled since he had Covid.  Past Medical History:  Diagnosis Date  . Aortic insufficiency    a. noted on TTE 2015  . Arthritis   . CAD (coronary artery disease)    a. remote PCI in 1991 and 2005; b. MV 3/15: old inferior MI, no ischemia, LVEF 50%, slight inferior wall hypokniesis  . Chicken pox   . Colon polyps    4  pre-cancerous   . Diverticulitis   . DM type 2 (diabetes mellitus, type 2) ([place])   . Family history of adverse reaction to anesthesia   . GERD (gastroesophageal reflux disease)   . Heart murmur   . History of kidney stones   . Hyperlipidemia   . Hypertension   . Kidney stones   . Melanoma ([place]) 1980   Resected from his back  . Mitral regurgitation    a. noted on TTE 2015  . Myocardial infarction ([place])   . Systolic dysfunction    a. TTE 2015: EF  50-55%, mild global hypokinesis, mild to moderate aortic sclerosis, mild aortic insufficiency, mild to moderate mitral regurgitation, mild tricuspid regurgitation, moderately dilated left atrium, mildly dilated right ventricle, PASP 31 mmHg    Current Outpatient Medications  Medication Sig Dispense Refill  . acetaminophen (TYLENOL) 650 MG CR tablet  Take 650 mg by mouth 2 (two) times a day.    [name] aspirin EC 81 MG tablet Take 81 mg by mouth daily.     . blood glucose meter kit and supplies KIT Dispense based on patient and insurance preference. Use up to four times daily as directed. (FOR ICD-9 250.00, 250.01). 1 each 0  . carvedilol (COREG) 3.125 MG tablet TAKE 1 TABLET(3.125 MG) BY MOUTH TWICE DAILY 180 tablet 1  . fluticasone (FLONASE) 50 MCG/ACT nasal spray Place 2 sprays into both nostrils daily. (Patient taking differently: Place 2 sprays into both nostrils daily as needed for allergies. ) 16 g 6  . glipiZIDE (GLUCOTROL XL) 5 MG 24 hr tablet Take 1 tablet (5 mg total) by mouth 2 (two) times daily. With food 180 tablet 3  . glucose blood (CONTOUR TEST) test strip 1 each by Other route 2 (two) times daily. Use as instructed 200 each 3  . hydrochlorothiazide (HYDRODIURIL) 25 MG tablet TAKE 1 TABLET(25 MG) BY MOUTH DAILY 90 tablet 0  . Insulin Pen Needle (PEN NEEDLES) 30G X 8 MM MISC 1 Device by Does not apply route once a week. 90 each 3  . ipratropium (ATROVENT) 0.06 % nasal spray Place 2 sprays into both nostrils 3 (three) times daily. 15 mL  12  . Lancets MISC 1 Device by Does not apply route 2 (two) times daily. 180 each 3  . lisinopril (ZESTRIL) 10 MG tablet TAKE 1 TABLET BY MOUTH DAILY 90 tablet 1  . Multiple Vitamin (MULTIVITAMIN) capsule Take 1 capsule by mouth daily with lunch.     . naproxen sodium (ALEVE) 220 MG tablet Take 220 mg by mouth 2 (two) times daily as needed (for pain or headache).    . Omega-3 Fatty Acids (FISH OIL PO) Take 1 capsule by mouth daily.     . pantoprazole (PROTONIX) 40 MG tablet TAKE 1 TABLET BY MOUTH TWICE DAILY 180 tablet 0  . Polyvinyl Alcohol-Povidone (MURINE TEARS FOR DRY EYES OP) Place 1-2 drops into both eyes daily as needed (for dry eyes).    . psyllium (METAMUCIL) 58.6 % powder Take 1 packet by mouth at bedtime.     . Semaglutide,0.25 or 0.'5MG'$ /DOS, (OZEMPIC, 0.25 OR 0.5 MG/DOSE,) 2 MG/1.5ML SOPN Inject 0.5 mg into the skin once a week.    . tamsulosin (FLOMAX) 0.4 MG CAPS capsule TAKE 1 CAPSULE(0.4 MG) BY MOUTH DAILY 90 capsule 3  . traMADol (ULTRAM) 50 MG tablet TAKE 1 TABLET BY MOUTH EVERY 12 HOURS AS NEEDED 60 tablet 1  . ezetimibe (ZETIA) 10 MG tablet Take 1 tablet (10 mg total) by mouth daily. 90 tablet 3  . glucose blood test strip Use as instructed to check blood sugar up to three times daily. E11.42 100 each 12  . rosuvastatin (CRESTOR) 10 MG tablet Take 1 tablet (10 mg total) by mouth daily. 90 tablet 3   No current facility-administered medications for this visit.    Allergies as of 03/25/2019 - Review Complete 03/25/2019  Allergen Reaction Noted  . Metformin and related Diarrhea 07/13/2018  . Azithromycin Other (See Comments) 07/07/2015    ROS:  General: Negative for anorexia, weight loss, fever, chills, fatigue, weakness. ENT: Negative for hoarseness, difficulty swallowing , nasal congestion. CV: Negative for chest pain, angina, palpitations, dyspnea on exertion, peripheral edema.  Respiratory: Negative for dyspnea at rest, dyspnea on exertion, cough, sputum, wheezing.   GI: See history of present illness. GU:  Negative for dysuria, hematuria, urinary incontinence,  urinary frequency, nocturnal urination.  Endo: Negative for unusual weight change.    Physical Examination:   BP 102/72   Pulse 79   Temp 98 F (36.7 C) (Oral)   Ht '6\' 1"'$  (1.854 m)   Wt 206 lb 3.2 oz (93.5 kg)   BMI 27.20 kg/m   General: Well-nourished, well-developed in no acute distress.  Eyes: No icterus. Conjunctivae pink. Lungs: Clear to auscultation bilaterally. Non-labored. Heart: Regular rate and rhythm, no murmurs rubs or gallops.  Abdomen: Bowel sounds are normal, nontender, nondistended, no hepatosplenomegaly or masses, no abdominal bruits or hernia , no rebound or guarding.   Extremities: No lower extremity edema. No clubbing or deformities. Neuro: Alert and oriented x 3.  Grossly intact. Skin: Warm and dry, no jaundice.   Psych: Alert and cooperative, normal mood and affect.  Labs:  [place] Imaging Studies: No results found.  Assessment and Plan:   Itzel Lowrimore is a 79 y.o. y/o male who comes in today with a recent finding of abnormal liver enzymes.  The patient will have his liver enzymes rechecked.  The patient's MRI back at the end of last year showed some fatty liver.  The patient will have labs set up for other possible cause of abnormal liver enzymes including GGT acute and chronic hepatitis panel and autoimmune markers.  The patient will also have his fractionation of his bilirubin sent off to make sure that it is all direct bilirubin.  The patient has been told that at his age it is unlikely that even a mild elevation of his liver enzymes will ever lead to any chronic liver disease that may be life-threatening but we will still investigate why his liver enzymes are abnormal.  The patient has been explained the plan and agrees with it.     [name], MD. [name]    Note: This dictation was prepared with Dragon dictation along with smaller phrase technology. Any  transcriptional errors that result from this process are unintentional.

## 2019-03-26 ENCOUNTER — Ambulatory Visit: Payer: Self-pay | Admitting: Pharmacist

## 2019-03-26 DIAGNOSIS — E118 Type 2 diabetes mellitus with unspecified complications: Secondary | ICD-10-CM

## 2019-03-26 LAB — ALPHA-1-ANTITRYPSIN: A-1 Antitrypsin, Ser: 95 mg/dL — ABNORMAL LOW (ref 101–187)

## 2019-03-26 LAB — CERULOPLASMIN: Ceruloplasmin: 23.9 mg/dL (ref 16.0–31.0)

## 2019-03-26 LAB — ANA: Anti Nuclear Antibody (ANA): NEGATIVE

## 2019-03-26 NOTE — Patient Instructions (Signed)
Visit Information  Goals Addressed            This Visit's Progress     Patient Stated   . "I want to work on my blood sugars" (pt-stated)       [place] (see longtitudinal plan of care for additional care plan information)  Current Barriers:  . Diabetes: uncontrolled; most recent [id number]; complicated by CAD, BPH o Patient contacted me to review GI appointment.  . Current antihyperglycemic regimen: Ozempic 0.5 mg weekly; glipizide XR 5 mg BID currently   o Has been unable to tolerate metformin, IR and ER, d/t diarrhea. Notes he developed diarrhea after a few years of tolerating metformin, but diarrhea did improve after metformin was discontinued. . Cardiovascular risk reduction: o Current hypertensive regimen: HCTZ 25 mg QAM, lisinopril 10 mg QAM o Current hyperlipidemia regimen: rosuvastatin 10 mg daily, ezetimibe 10 mg daily; LDL at goal <70  Pharmacist Clinical Goal(s):  [name] Over the next 90 days, patient will work with PharmD and primary care provider to address optimized medication management  Interventions: . Reviewed options of increasing Ozempic vs adding SGLT2. Recommend maximizing Ozempic, which patient is agreeable to. If in agreement, will collaborate w/ [name] to update order in [place]. Will print provider portion of application for her signature. Patient notes he dropped off his portion of application to the clinic today. Will collaborate w/ CPhT to complete application  Patient Self Care Activities:  . Patient will check blood glucose BID , document, and provide at future appointments . Patient will take medications as prescribed . Patient will report any questions or concerns to provider   Please see past updates related to this goal by clicking on the "Past Updates" button in the selected goal         Patient verbalizes understanding of instructions provided today.    Plan:  - Will collaborate w/ CPhT, patient, and provider as above  Catie  Darnelle Maffucci, PharmD, [place], [place] [phone]

## 2019-03-26 NOTE — Chronic Care Management (AMB) (Signed)
Chronic Care Management   Follow Up Note   03/26/2019 Name: [name] [name] MRN: [id number] DOB: [birth year]  Referred by: [name], [name] Reason for referral : Chronic Care Management (Medication Management)   [name] [name] is a 79 y.o. year old male who is a primary care patient of [name], [name]. The CCM team was consulted for assistance with chronic disease management and care coordination needs.    Received call from patient today.   Review of patient status, including review of consultants reports, relevant laboratory and other test results, and collaboration with appropriate care team members and the patient's provider was performed as part of comprehensive patient evaluation and provision of chronic care management services.    SDOH (Social Determinants of Health) assessments performed: No See Care Plan activities for detailed interventions related to [facility])     Outpatient Encounter Medications as of 03/26/2019  Medication Sig Note  . acetaminophen (TYLENOL) 650 MG CR tablet Take 650 mg by mouth 2 (two) times a day.   [name] aspirin EC 81 MG tablet Take 81 mg by mouth daily.    . blood glucose meter kit and supplies KIT Dispense based on patient and insurance preference. Use up to four times daily as directed. (FOR ICD-9 250.00, 250.01).   . carvedilol (COREG) 3.125 MG tablet TAKE 1 TABLET(3.125 MG) BY MOUTH TWICE DAILY   . ezetimibe (ZETIA) 10 MG tablet Take 1 tablet (10 mg total) by mouth daily.   . fluticasone (FLONASE) 50 MCG/ACT nasal spray Place 2 sprays into both nostrils daily. (Patient taking differently: Place 2 sprays into both nostrils daily as needed for allergies. )   . glipiZIDE (GLUCOTROL XL) 5 MG 24 hr tablet Take 1 tablet (5 mg total) by mouth 2 (two) times daily. With food   . glucose blood (CONTOUR TEST) test strip 1 each by Other route 2 (two) times daily. Use as instructed   . hydrochlorothiazide (HYDRODIURIL) 25 MG tablet TAKE 1 TABLET(25  MG) BY MOUTH DAILY   . Insulin Pen Needle (PEN NEEDLES) 30G X 8 MM MISC 1 Device by Does not apply route once a week.   [name] ipratropium (ATROVENT) 0.06 % nasal spray Place 2 sprays into both nostrils 3 (three) times daily.   . Lancets MISC 1 Device by Does not apply route 2 (two) times daily.   [name] lisinopril (ZESTRIL) 10 MG tablet TAKE 1 TABLET BY MOUTH DAILY   . Multiple Vitamin (MULTIVITAMIN) capsule Take 1 capsule by mouth daily with lunch.    . naproxen sodium (ALEVE) 220 MG tablet Take 220 mg by mouth 2 (two) times daily as needed (for pain or headache).   . Omega-3 Fatty Acids (FISH OIL PO) Take 1 capsule by mouth daily.    . pantoprazole (PROTONIX) 40 MG tablet TAKE 1 TABLET BY MOUTH TWICE DAILY   . Polyvinyl Alcohol-Povidone (MURINE TEARS FOR DRY EYES OP) Place 1-2 drops into both eyes daily as needed (for dry eyes).   . psyllium (METAMUCIL) 58.6 % powder Take 1 packet by mouth at bedtime.  07/13/2018: PRN  . rosuvastatin (CRESTOR) 10 MG tablet Take 1 tablet (10 mg total) by mouth daily.   . Semaglutide,0.25 or 0.5MG/DOS, (OZEMPIC, 0.25 OR 0.5 MG/DOSE,) 2 MG/1.5ML SOPN Inject 0.5 mg into the skin once a week. 02/25/2019: Taking 0.5 mg weekly  . tamsulosin (FLOMAX) 0.4 MG CAPS capsule TAKE 1 CAPSULE(0.4 MG) BY MOUTH DAILY   . traMADol (ULTRAM) 50 MG tablet TAKE 1 TABLET BY  MOUTH EVERY 12 HOURS AS NEEDED    No facility-administered encounter medications on file as of 03/26/2019.     Objective:   Goals Addressed            This Visit's Progress     Patient Stated   . "I want to work on my blood sugars" (pt-stated)       [name] [name] (see longtitudinal plan of care for additional care plan information)  Current Barriers:  . Diabetes: uncontrolled; most recent O2D 7.4%; complicated by CAD, BPH o Patient contacted me to review GI appointment.  . Current antihyperglycemic regimen: Ozempic 0.5 mg weekly; glipizide XR 5 mg BID currently   o Has been unable to tolerate metformin, IR  and ER, d/t diarrhea. Notes he developed diarrhea after a few years of tolerating metformin, but diarrhea did improve after metformin was discontinued. . Cardiovascular risk reduction: o Current hypertensive regimen: HCTZ 25 mg QAM, lisinopril 10 mg QAM o Current hyperlipidemia regimen: rosuvastatin 10 mg daily, ezetimibe 10 mg daily; LDL at goal <70  Pharmacist Clinical Goal(s):  [name] Over the next 90 days, patient will work with PharmD and primary care provider to address optimized medication management  Interventions: . Reviewed options of increasing Ozempic vs adding SGLT2. Recommend maximizing Ozempic, which patient is agreeable to. If in agreement, will collaborate w/ [name] to update order in [place]. Will print provider portion of application for her signature. Patient notes he dropped off his portion of application to the clinic today. Will collaborate w/ CPhT to complete application  Patient Self Care Activities:  . Patient will check blood glucose BID , document, and provide at future appointments . Patient will take medications as prescribed . Patient will report any questions or concerns to provider   Please see past updates related to this goal by clicking on the "Past Updates" button in the selected goal          Plan:  - Will collaborate w/ CPhT, patient, and provider as above  [name], PharmD, [place], [place] Pharmacist [facility] [phone]

## 2019-03-27 LAB — ANTI-SMOOTH MUSCLE ANTIBODY, IGG: F-Actin IgG: 5 Units (ref 0–19)

## 2019-03-27 LAB — MITOCHONDRIAL ANTIBODIES: Mitochondrial M2 Ab, IgG: <20 Units (ref 0.0–20.0)

## 2019-03-29 ENCOUNTER — Encounter: Payer: Self-pay | Admitting: Family

## 2019-03-29 ENCOUNTER — Telehealth: Payer: Self-pay | Admitting: Family

## 2019-03-29 MED ORDER — OZEMPIC (0.25 OR 0.5 MG/DOSE) 2 MG/1.5ML ~~LOC~~ SOPN: 1.0000 mg | PEN_INJECTOR | SUBCUTANEOUS | 3 refills | Status: DC

## 2019-03-29 NOTE — Telephone Encounter (Signed)
Signed and Judson Roch is faxing now:)

## 2019-03-29 NOTE — Telephone Encounter (Signed)
Once signed by [name], please scan to my email from the copier at the front. He's already provided the rest of the application paperwork to me! Hopefully, he won't need to purchase a prescription for the Ozempic while we wait on this supply from the company  [name] [name]

## 2019-03-29 NOTE — Addendum Note (Signed)
Addended by: [name] on: 03/29/2019 12:49 PM   Modules accepted: Orders

## 2019-03-29 NOTE — Telephone Encounter (Signed)
Okay perfect. Thank you!

## 2019-03-29 NOTE — Telephone Encounter (Signed)
I called and informed patient to increase dose of Ozempic & that this was sent to his pharmacy. Patient stated that he would take up what he had first then pick up new prescription when he ran out.   Patient also dropped off patient assistance form for Ozempic from [employer]. It appears all it needs is a signature. I will put in blue folder to sign, unless Catie there is anything that is needed to along with the application?

## 2019-03-29 NOTE — Telephone Encounter (Signed)
FYI I just scanned to you!:)

## 2019-03-29 NOTE — Progress Notes (Signed)
Agree with plan, increasing Ozempic.  I have placed order and phone call to patient. [name], NP

## 2019-03-29 NOTE — Telephone Encounter (Signed)
Call patient After collaborating with pharmacist, Catie, I also agreed that he could benefit from increasing Ozempic to 1  Mg qwk.  I have sent this new dose to his pharmacy. Please confirm that he has no family or personal history of thyroid cancer so I can put that in his chart.

## 2019-03-30 ENCOUNTER — Other Ambulatory Visit: Payer: Self-pay | Admitting: Pharmacy Technician

## 2019-03-30 NOTE — Patient Outreach (Signed)
[facility]) Care Management  03/30/2019  [name] [name] 07-Sep-1940 [id number]   Received both patient and provider portion(s) of patient assistance application(s) for Ozepmic. Faxed completed application and required documents into [employer].  Will follow up with company(ies) in 5-7 business days to check status of application(s).  Quinzell Malcomb P. Amulya Quintin, [place]  [phone]

## 2019-04-01 ENCOUNTER — Telehealth: Payer: Self-pay | Admitting: Gastroenterology

## 2019-04-01 NOTE — Telephone Encounter (Signed)
Patient called & stated he had received a message from DR [name] he needed to make an appointment to f/u on lab work. I have currently made an appointment for 05-25-2019 in the [place] office . He ask to be placed on the waiting. Does he need to come sooner? If so when?

## 2019-04-05 ENCOUNTER — Other Ambulatory Visit: Payer: Self-pay | Admitting: Family

## 2019-04-05 ENCOUNTER — Other Ambulatory Visit: Payer: Self-pay | Admitting: Pharmacy Technician

## 2019-04-05 ENCOUNTER — Ambulatory Visit: Payer: Self-pay | Admitting: Pharmacist

## 2019-04-05 ENCOUNTER — Encounter: Payer: Self-pay | Admitting: Pharmacist

## 2019-04-05 DIAGNOSIS — E118 Type 2 diabetes mellitus with unspecified complications: Secondary | ICD-10-CM

## 2019-04-05 MED ORDER — OZEMPIC (0.25 OR 0.5 MG/DOSE) 2 MG/1.5ML ~~LOC~~ SOPN: 1.0000 mg | PEN_INJECTOR | SUBCUTANEOUS | 3 refills | Status: DC

## 2019-04-05 NOTE — Patient Instructions (Signed)
Visit Information  Goals Addressed            This Visit's Progress     Patient Stated   . "I want to work on my blood sugars" (pt-stated)       [place] (see longtitudinal plan of care for additional care plan information)  Current Barriers:  . Diabetes: uncontrolled; most recent [id number]; complicated by CAD, BPH o Received message from CPhT that per [employer], patient needs to apply for Medicare Extra Help. . Current antihyperglycemic regimen: Ozempic 0.5 mg weekly; glipizide XR 5 mg BID currently   o Has been unable to tolerate metformin, IR and ER, d/t diarrhea. Notes he developed diarrhea after a few years of tolerating metformin, but diarrhea did improve after metformin was discontinued. . Cardiovascular risk reduction: o Current hypertensive regimen: HCTZ 25 mg QAM, lisinopril 10 mg QAM o Current hyperlipidemia regimen: rosuvastatin 10 mg daily, ezetimibe 10 mg daily; LDL at goal <70  Pharmacist Clinical Goal(s):  [name] Over the next 90 days, patient will work with PharmD and primary care provider to address optimized medication management  Interventions: . Provided counseling on Medicare Extra Help and url for application. Patient will complete the application. Aware that decision will come in the mail in 4-6 weeks. Reviewed that he will need to fill Ozempic at the pharmacy in the meantime.  Patient Self Care Activities:  . Patient will check blood glucose BID , document, and provide at future appointments . Patient will take medications as prescribed . Patient will report any questions or concerns to provider   Please see past updates related to this goal by clicking on the "Past Updates" button in the selected goal         Patient verbalizes understanding of instructions provided today.   Plan:  - Will outreach as previously scheduled  [name], PharmD, [place], [place] Pharmacist [place] [phone]

## 2019-04-05 NOTE — Progress Notes (Signed)
Agree with plan and have refilled ozempic at increased dose of 1mg   per patient's request.. [name], NP

## 2019-04-05 NOTE — Chronic Care Management (AMB) (Addendum)
Chronic Care Management   Follow Up Note   04/05/2019 Name: [name] [name] MRN: [id number] DOB: 1940/03/24  Referred by: [name], [name] Reason for referral : Chronic Care Management (Medication Management)   [name] [name] is a 79 y.o. year old male who is a primary care patient of [name], [name]. The CCM team was consulted for assistance with chronic disease management and care coordination needs.    Care coordination completed today.   Review of patient status, including review of consultants reports, relevant laboratory and other test results, and collaboration with appropriate care team members and the patient's provider was performed as part of comprehensive patient evaluation and provision of chronic care management services.    SDOH (Social Determinants of Health) assessments performed: Yes See Care Plan activities for detailed interventions related to [facility])     Outpatient Encounter Medications as of 04/05/2019  Medication Sig Note  . acetaminophen (TYLENOL) 650 MG CR tablet Take 650 mg by mouth 2 (two) times a day.   [name] aspirin EC 81 MG tablet Take 81 mg by mouth daily.    . blood glucose meter kit and supplies KIT Dispense based on patient and insurance preference. Use up to four times daily as directed. (FOR ICD-9 250.00, 250.01).   . carvedilol (COREG) 3.125 MG tablet TAKE 1 TABLET(3.125 MG) BY MOUTH TWICE DAILY   . ezetimibe (ZETIA) 10 MG tablet Take 1 tablet (10 mg total) by mouth daily.   . fluticasone (FLONASE) 50 MCG/ACT nasal spray Place 2 sprays into both nostrils daily. (Patient taking differently: Place 2 sprays into both nostrils daily as needed for allergies. )   . glipiZIDE (GLUCOTROL XL) 5 MG 24 hr tablet Take 1 tablet (5 mg total) by mouth 2 (two) times daily. With food   . glucose blood (CONTOUR TEST) test strip 1 each by Other route 2 (two) times daily. Use as instructed   . hydrochlorothiazide (HYDRODIURIL) 25 MG tablet TAKE 1 TABLET(25  MG) BY MOUTH DAILY   . Insulin Pen Needle (PEN NEEDLES) 30G X 8 MM MISC 1 Device by Does not apply route once a week.   [name] ipratropium (ATROVENT) 0.06 % nasal spray Place 2 sprays into both nostrils 3 (three) times daily.   . Lancets MISC 1 Device by Does not apply route 2 (two) times daily.   [name] lisinopril (ZESTRIL) 10 MG tablet TAKE 1 TABLET BY MOUTH DAILY   . Multiple Vitamin (MULTIVITAMIN) capsule Take 1 capsule by mouth daily with lunch.    . naproxen sodium (ALEVE) 220 MG tablet Take 220 mg by mouth 2 (two) times daily as needed (for pain or headache).   . Omega-3 Fatty Acids (FISH OIL PO) Take 1 capsule by mouth daily.    . pantoprazole (PROTONIX) 40 MG tablet TAKE 1 TABLET BY MOUTH TWICE DAILY   . Polyvinyl Alcohol-Povidone (MURINE TEARS FOR DRY EYES OP) Place 1-2 drops into both eyes daily as needed (for dry eyes).   . psyllium (METAMUCIL) 58.6 % powder Take 1 packet by mouth at bedtime.  07/13/2018: PRN  . rosuvastatin (CRESTOR) 10 MG tablet Take 1 tablet (10 mg total) by mouth daily.   . Semaglutide,0.25 or 0.5MG/DOS, (OZEMPIC, 0.25 OR 0.5 MG/DOSE,) 2 MG/1.5ML SOPN Inject 1 mg into the skin once a week.   . tamsulosin (FLOMAX) 0.4 MG CAPS capsule TAKE 1 CAPSULE(0.4 MG) BY MOUTH DAILY   . traMADol (ULTRAM) 50 MG tablet TAKE 1 TABLET BY MOUTH EVERY 12 HOURS AS  NEEDED    No facility-administered encounter medications on file as of 04/05/2019.     Objective:   Goals Addressed            This Visit's Progress     Patient Stated   . "I want to work on my blood sugars" (pt-stated)       [place] (see longtitudinal plan of care for additional care plan information)  Current Barriers:  . Diabetes: uncontrolled; most recent S3P 5.9%; complicated by CAD, BPH o Received message from CPhT that per [employer], patient needs to apply for Medicare Extra Help. . Current antihyperglycemic regimen: Ozempic 0.5 mg weekly; glipizide XR 5 mg BID currently   o Has been unable to  tolerate metformin, IR and ER, d/t diarrhea. Notes he developed diarrhea after a few years of tolerating metformin, but diarrhea did improve after metformin was discontinued. . Cardiovascular risk reduction: o Current hypertensive regimen: HCTZ 25 mg QAM, lisinopril 10 mg QAM o Current hyperlipidemia regimen: rosuvastatin 10 mg daily, ezetimibe 10 mg daily; LDL at goal <70  Pharmacist Clinical Goal(s):  [name] Over the next 90 days, patient will work with PharmD and primary care provider to address optimized medication management  Interventions: . Provided counseling on Medicare Extra Help and url for application. Patient will complete the application. Aware that decision will come in the mail in 4-6 weeks. Reviewed that he will need to fill Ozempic at the pharmacy in the meantime. . Patient called me back, noted that he has completed and submitted the application.  . Patient requested that we send a prescription for Ozempic 1 mg to the pharmacy to be on file when he needs it filled. Will collaborate w/ PCP on this.  Patient Self Care Activities:  . Patient will check blood glucose BID , document, and provide at future appointments . Patient will take medications as prescribed . Patient will report any questions or concerns to provider   Please see past updates related to this goal by clicking on the "Past Updates" button in the selected goal          Plan:  - Will outreach as previously scheduled  [name], PharmD, [place], [place] Pharmacist [place] [phone]

## 2019-04-05 NOTE — Patient Outreach (Signed)
[place] [facility]) Care Management  04/05/2019  Aiman Hecksel [birth year] [id number]   Care coordination call placed to [place] in regards to patient's Ozempic application.  Spoke to [place] who informed patient will need to apply for LIS. Once denied the letter will need to be faxed to [employer] for the application  process to continue. If approved, then [employer]  would not accept patient  into the program.  Joseph Art also informed provider's state license number was expired but after consulting another medium, Renee was able to get the number accepted. She informed that we may want to send in a copy of license expiration as well just to be on the safe side.  Will outreach embedded [place] with assistance in helping patient apply for LIS through social security.  Azreal Stthomas P. Saydie Gerdts, [place]  [phone]

## 2019-04-06 NOTE — Telephone Encounter (Signed)
Mychart message has been sent to pt offering a sooner appt to discuss results.

## 2019-04-13 ENCOUNTER — Ambulatory Visit: Payer: Self-pay | Admitting: Pharmacist

## 2019-04-13 DIAGNOSIS — E1165 Type 2 diabetes mellitus with hyperglycemia: Secondary | ICD-10-CM

## 2019-04-13 DIAGNOSIS — I25118 Atherosclerotic heart disease of native coronary artery with other forms of angina pectoris: Secondary | ICD-10-CM | POA: Diagnosis not present

## 2019-04-13 DIAGNOSIS — E118 Type 2 diabetes mellitus with unspecified complications: Secondary | ICD-10-CM

## 2019-04-13 NOTE — Patient Instructions (Signed)
Visit Information  Goals Addressed            This Visit's Progress     Patient Stated   . "I want to work on my blood sugars" (pt-stated)       [place] (see longtitudinal plan of care for additional care plan information)  Current Barriers:  . Diabetes: uncontrolled; most recent 123456 [id number]; complicated by CAD, BPH o Novo Nordisk requiring patient to submit proof of denial for Medicare Extra Help. Patient submitted this on 04/05/19 o Patient calls today noting that he got a preliminary denial letter from [facility] Extra Help . Current antihyperglycemic regimen: Ozempic 0.5 mg weekly; glipizide XR 5 mg BID currently   o Has been unable to tolerate metformin, IR and ER, d/t diarrhea. Notes he developed diarrhea after a few years of tolerating metformin, but diarrhea did improve after metformin was discontinued. . Cardiovascular risk reduction: o Current hypertensive regimen: HCTZ 25 mg QAM, lisinopril 10 mg QAM o Current hyperlipidemia regimen: rosuvastatin 10 mg daily, ezetimibe 10 mg daily; LDL at goal <70  Pharmacist Clinical Goal(s):  [name] Over the next 90 days, patient will work with PharmD and primary care provider to address optimized medication management  Interventions: . Called patient back. Discussed process. He will bring the preliminary denial letter by the office and have a copy made and scanned to me. I will pass along to [employer], CPhT to submit to [employer].  Patient Self Care Activities:  . Patient will check blood glucose BID , document, and provide at future appointments . Patient will take medications as prescribed . Patient will report any questions or concerns to provider   Please see past updates related to this goal by clicking on the "Past Updates" button in the selected goal         Patient verbalizes understanding of instructions provided today.   Plan:  - Will collaborate w/ patient as above  [name], PharmD, [place], [place]  Pharmacist [place] [phone]

## 2019-04-13 NOTE — Chronic Care Management (AMB) (Signed)
Chronic Care Management   Follow Up Note   04/13/2019 Name: [name] [name] MRN: [id number] DOB: Jul 10, 1940  Referred by: [name], [name] Reason for referral : Chronic Care Management (Medication Management)   [name] [name] [place] is a 79 y.o. year old male who is a primary care patient of [name], [name]. The CCM team was consulted for assistance with chronic disease management and care coordination needs.    Received call from patient today.   Review of patient status, including review of consultants reports, relevant laboratory and other test results, and collaboration with appropriate care team members and the patient's provider was performed as part of comprehensive patient evaluation and provision of chronic care management services.    SDOH (Social Determinants of Health) assessments performed: Yes See Care Plan activities for detailed interventions related to [facility])     Outpatient Encounter Medications as of 04/13/2019  Medication Sig Note  . acetaminophen (TYLENOL) 650 MG CR tablet Take 650 mg by mouth 2 (two) times a day.   [name] aspirin EC 81 MG tablet Take 81 mg by mouth daily.    . blood glucose meter kit and supplies KIT Dispense based on patient and insurance preference. Use up to four times daily as directed. (FOR ICD-9 250.00, 250.01).   . carvedilol (COREG) 3.125 MG tablet TAKE 1 TABLET(3.125 MG) BY MOUTH TWICE DAILY   . ezetimibe (ZETIA) 10 MG tablet Take 1 tablet (10 mg total) by mouth daily.   . fluticasone (FLONASE) 50 MCG/ACT nasal spray Place 2 sprays into both nostrils daily. (Patient taking differently: Place 2 sprays into both nostrils daily as needed for allergies. )   . glipiZIDE (GLUCOTROL XL) 5 MG 24 hr tablet Take 1 tablet (5 mg total) by mouth 2 (two) times daily. With food   . glucose blood (CONTOUR TEST) test strip 1 each by Other route 2 (two) times daily. Use as instructed   . hydrochlorothiazide (HYDRODIURIL) 25 MG tablet TAKE 1 TABLET(25  MG) BY MOUTH DAILY   . Insulin Pen Needle (PEN NEEDLES) 30G X 8 MM MISC 1 Device by Does not apply route once a week.   [name] ipratropium (ATROVENT) 0.06 % nasal spray Place 2 sprays into both nostrils 3 (three) times daily.   . Lancets MISC 1 Device by Does not apply route 2 (two) times daily.   [name] lisinopril (ZESTRIL) 10 MG tablet TAKE 1 TABLET BY MOUTH DAILY   . Multiple Vitamin (MULTIVITAMIN) capsule Take 1 capsule by mouth daily with lunch.    . naproxen sodium (ALEVE) 220 MG tablet Take 220 mg by mouth 2 (two) times daily as needed (for pain or headache).   . Omega-3 Fatty Acids (FISH OIL PO) Take 1 capsule by mouth daily.    . pantoprazole (PROTONIX) 40 MG tablet TAKE 1 TABLET BY MOUTH TWICE DAILY   . Polyvinyl Alcohol-Povidone (MURINE TEARS FOR DRY EYES OP) Place 1-2 drops into both eyes daily as needed (for dry eyes).   . psyllium (METAMUCIL) 58.6 % powder Take 1 packet by mouth at bedtime.  07/13/2018: PRN  . rosuvastatin (CRESTOR) 10 MG tablet Take 1 tablet (10 mg total) by mouth daily.   . Semaglutide,0.25 or 0.'5MG'$ /DOS, (OZEMPIC, 0.25 OR 0.5 MG/DOSE,) 2 MG/1.5ML SOPN Inject 1 mg into the skin once a week.   . tamsulosin (FLOMAX) 0.4 MG CAPS capsule TAKE 1 CAPSULE(0.4 MG) BY MOUTH DAILY   . traMADol (ULTRAM) 50 MG tablet TAKE 1 TABLET BY MOUTH EVERY 12 HOURS  AS NEEDED    No facility-administered encounter medications on file as of 04/13/2019.     Objective:   Goals Addressed            This Visit's Progress     Patient Stated   . "I want to work on my blood sugars" (pt-stated)       [place] (see longtitudinal plan of care for additional care plan information)  Current Barriers:  . Diabetes: uncontrolled; most recent O7F 6.4%; complicated by CAD, BPH o Novo Nordisk requiring patient to submit proof of denial for Medicare Extra Help. Patient submitted this on 04/05/19 o Patient calls today noting that he got a preliminary denial letter from [facility] Extra Help . Current  antihyperglycemic regimen: Ozempic 0.5 mg weekly; glipizide XR 5 mg BID currently   o Has been unable to tolerate metformin, IR and ER, d/t diarrhea. Notes he developed diarrhea after a few years of tolerating metformin, but diarrhea did improve after metformin was discontinued. . Cardiovascular risk reduction: o Current hypertensive regimen: HCTZ 25 mg QAM, lisinopril 10 mg QAM o Current hyperlipidemia regimen: rosuvastatin 10 mg daily, ezetimibe 10 mg daily; LDL at goal <70  Pharmacist Clinical Goal(s):  [name] Over the next 90 days, patient will work with PharmD and primary care provider to address optimized medication management  Interventions: . Called patient back. Discussed process. He will bring the preliminary denial letter by the office and have a copy made and scanned to me. I will pass along to [employer], CPhT to submit to [employer].  Patient Self Care Activities:  . Patient will check blood glucose BID , document, and provide at future appointments . Patient will take medications as prescribed . Patient will report any questions or concerns to provider   Please see past updates related to this goal by clicking on the "Past Updates" button in the selected goal          Plan:  - Will collaborate w/ patient as above  [name], PharmD, [place], [place] Pharmacist [facility] [phone]

## 2019-04-14 NOTE — Progress Notes (Signed)
Agree with plan. Reinhard Schack, NP  

## 2019-04-15 ENCOUNTER — Other Ambulatory Visit: Payer: Self-pay | Admitting: Pharmacy Technician

## 2019-04-15 NOTE — Patient Outreach (Signed)
[place] [facility]) Care Management  04/15/2019  Jsiah Volkers 10-27-1940 [id number]    Received patient's pre decisional notice of denial for LIS/Extra Help from social security.  Faxed required document into [employer] in regards to patient's Ozempic application.  Will follow up with [employer] in 5-7 business days.  Ceyda Peterka P. Larry Alcock, [place]  [phone]

## 2019-04-22 ENCOUNTER — Other Ambulatory Visit: Payer: Self-pay | Admitting: Pharmacy Technician

## 2019-04-22 NOTE — Patient Outreach (Signed)
[place] [facility]) Care Management  04/22/2019  [name] [name] 02/01/1940 [id number]  Care coordination call placed to [place] in regards to patient's Ozempic application.  Spoke to [place] who informed patient was APPROVED 04/22/2019-12/14/2019. She informed patient would be receiving 4 boxes of Ozempic delivered to the provider's office in the next 10-14 business days.  Will follow up with patient in 10-14 business days to confirm receipt of medication.  [name] P. Howard Patton, [place]  [phone]

## 2019-04-26 ENCOUNTER — Telehealth: Payer: Self-pay | Admitting: Family

## 2019-04-26 ENCOUNTER — Other Ambulatory Visit: Payer: Self-pay

## 2019-04-26 MED ORDER — ONETOUCH VERIO W/DEVICE KIT: 1.0000 | PACK | Freq: Every day | 0 refills | Status: DC

## 2019-04-26 MED ORDER — ONETOUCH VERIO VI STRP: ORAL_STRIP | 12 refills | Status: DC

## 2019-04-26 NOTE — Telephone Encounter (Signed)
I have sent one touch verio to patient's pharmacy.

## 2019-04-26 NOTE — Telephone Encounter (Signed)
Pt said his insurance no longer covers the brand of diabetic montoring device and test strips. He will need the brands One Touch Verio, One Touch Verio Reflex, or Accu Check can be used. Please call in new device and test strip to [facility].

## 2019-04-27 ENCOUNTER — Other Ambulatory Visit: Payer: Self-pay

## 2019-04-27 ENCOUNTER — Ambulatory Visit: Payer: Medicare Other | Admitting: Gastroenterology

## 2019-04-27 ENCOUNTER — Encounter: Payer: Self-pay | Admitting: Gastroenterology

## 2019-04-27 VITALS — BP 115/65 | HR 76 | Temp 98.5°F | Ht 73.0 in | Wt 206.0 lb

## 2019-04-27 DIAGNOSIS — K760 Fatty (change of) liver, not elsewhere classified: Secondary | ICD-10-CM | POA: Diagnosis not present

## 2019-04-27 NOTE — Progress Notes (Signed)
Primary Care Physician: [name], [name]  Primary Gastroenterologist:  Dr. [name]  Chief Complaint  Patient presents with  . follow up lab results    HPI: [name] [name] is a 79 y.o. male here for follow-up of abnormal liver enzymes.  The patient had lab sent off that showed the patient not to be immune to hepatitis A or hepatitis B and will need vaccinations for that.  The patient's autoimmune markers were negative and he had a slightly low alpha 1 antitrypsinase at 95. The bilirubin has been found to be indirect consistent with [name] [name].  The patient reports that his hemoglobin A1c is running over 8 and he has gained a pound or two.  The patient also reports that he is trying to walk more frequently.  Past Medical History:  Diagnosis Date  . Aortic insufficiency    a. noted on TTE 2015  . Arthritis   . CAD (coronary artery disease)    a. remote PCI in 1991 and 2005; b. MV 3/15: old inferior MI, no ischemia, LVEF 50%, slight inferior wall hypokniesis  . Chicken pox   . Colon polyps    4 pre-cancerous   . Diverticulitis   . DM type 2 (diabetes mellitus, type 2) ([place])   . Family history of adverse reaction to anesthesia   . GERD (gastroesophageal reflux disease)   . Heart murmur   . History of kidney stones   . Hyperlipidemia   . Hypertension   . Kidney stones   . Melanoma ([name] [name]) 1980   Resected from his back  . Mitral regurgitation    a. noted on TTE 2015  . Myocardial infarction ([place])   . Systolic dysfunction    a. TTE 2015: EF  50-55%, mild global hypokinesis, mild to moderate aortic sclerosis, mild aortic insufficiency, mild to moderate mitral regurgitation, mild tricuspid regurgitation, moderately dilated left atrium, mildly dilated right ventricle, PASP 31 mmHg    Current Outpatient Medications  Medication Sig Dispense Refill  . acetaminophen (TYLENOL) 650 MG CR tablet Take 650 mg by mouth 2 (two) times a day.    [name] aspirin EC 81 MG tablet Take 81 mg  by mouth daily.     . blood glucose meter kit and supplies KIT Dispense based on patient and insurance preference. Use up to four times daily as directed. (FOR ICD-9 250.00, 250.01). 1 each 0  . Blood Glucose Monitoring Suppl (ONETOUCH VERIO) w/Device KIT 1 Device by Does not apply route daily. 1 kit 0  . carvedilol (COREG) 3.125 MG tablet TAKE 1 TABLET(3.125 MG) BY MOUTH TWICE DAILY 180 tablet 1  . fluticasone (FLONASE) 50 MCG/ACT nasal spray Place 2 sprays into both nostrils daily. (Patient taking differently: Place 2 sprays into both nostrils daily as needed for allergies. ) 16 g 6  . glipiZIDE (GLUCOTROL XL) 5 MG 24 hr tablet Take 1 tablet (5 mg total) by mouth 2 (two) times daily. With food 180 tablet 3  . glucose blood (ONETOUCH VERIO) test strip Used to check blood sugars twice a day. 100 each 12  . hydrochlorothiazide (HYDRODIURIL) 25 MG tablet TAKE 1 TABLET(25 MG) BY MOUTH DAILY 90 tablet 0  . Insulin Pen Needle (PEN NEEDLES) 30G X 8 MM MISC 1 Device by Does not apply route once a week. 90 each 3  . ipratropium (ATROVENT) 0.06 % nasal spray Place 2 sprays into both nostrils 3 (three) times daily. 15 mL 12  . Lancets MISC 1 Device by  Does not apply route 2 (two) times daily. 180 each 3  . lisinopril (ZESTRIL) 10 MG tablet TAKE 1 TABLET BY MOUTH DAILY 90 tablet 1  . Multiple Vitamin (MULTIVITAMIN) capsule Take 1 capsule by mouth daily with lunch.     . naproxen sodium (ALEVE) 220 MG tablet Take 220 mg by mouth 2 (two) times daily as needed (for pain or headache).    . Omega-3 Fatty Acids (FISH OIL PO) Take 1 capsule by mouth daily.     . pantoprazole (PROTONIX) 40 MG tablet TAKE 1 TABLET BY MOUTH TWICE DAILY 180 tablet 0  . Polyvinyl Alcohol-Povidone (MURINE TEARS FOR DRY EYES OP) Place 1-2 drops into both eyes daily as needed (for dry eyes).    . psyllium (METAMUCIL) 58.6 % powder Take 1 packet by mouth at bedtime.     . Semaglutide,0.25 or 0.5MG/DOS, (OZEMPIC, 0.25 OR 0.5 MG/DOSE,) 2  MG/1.5ML SOPN Inject 1 mg into the skin once a week. 3 pen 3  . tamsulosin (FLOMAX) 0.4 MG CAPS capsule TAKE 1 CAPSULE(0.4 MG) BY MOUTH DAILY 90 capsule 3  . traMADol (ULTRAM) 50 MG tablet TAKE 1 TABLET BY MOUTH EVERY 12 HOURS AS NEEDED 60 tablet 1  . ezetimibe (ZETIA) 10 MG tablet Take 1 tablet (10 mg total) by mouth daily. 90 tablet 3  . rosuvastatin (CRESTOR) 10 MG tablet Take 1 tablet (10 mg total) by mouth daily. 90 tablet 3   No current facility-administered medications for this visit.    Allergies as of 04/27/2019 - Review Complete 04/27/2019  Allergen Reaction Noted  . Metformin and related Diarrhea 07/13/2018  . Azithromycin Other (See Comments) 07/07/2015    ROS:  General: Negative for anorexia, weight loss, fever, chills, fatigue, weakness. ENT: Negative for hoarseness, difficulty swallowing , nasal congestion. CV: Negative for chest pain, angina, palpitations, dyspnea on exertion, peripheral edema.  Respiratory: Negative for dyspnea at rest, dyspnea on exertion, cough, sputum, wheezing.  GI: See history of present illness. GU:  Negative for dysuria, hematuria, urinary incontinence, urinary frequency, nocturnal urination.  Endo: Negative for unusual weight change.    Physical Examination:   BP 115/65   Pulse 76   Temp 98.5 F (36.9 C) (Oral)   Ht _0  (1.854 m)   Wt 206 lb (93.4 kg)   BMI 27.18 kg/m   General: Well-nourished, well-developed in no acute distress.  Eyes: No icterus. Conjunctivae pink. Lungs: Clear to auscultation bilaterally. Non-labored. Heart: Regular rate and rhythm, no murmurs rubs or gallops.  Abdomen: Bowel sounds are normal, nontender, nondistended, no hepatosplenomegaly or masses, no abdominal bruits or hernia , no rebound or guarding.   Extremities: No lower extremity edema. No clubbing or deformities. Neuro: Alert and oriented x 3.  Grossly intact. Skin: Warm and dry, no jaundice.   Psych: Alert and cooperative, normal mood and affect.   Labs:    Imaging Studies: No results found.  Assessment and Plan:   [name] [name] is a 79 y.o. y/o male who comes in today with a history of abnormal liver enzymes.  The patient's blood work did not show any cause for his abnormal liver enzymes and he only had a slightly low alpha 1 antitrypsin.  He is unlikely having deficiency as the cause of his abnormal liver enzymes since he does not have any other presentation such as emphysema.  The patient has been told to try and control his diabetes and lose weight since he has been found to have fatty liver and it  is the likely cause of his abnormal liver enzymes.  The patient will have his labs checked in the next 2 to 3 months to see if his enzymes are coming down.  The patient has been explained the plan and agrees with it.     [name], MD. [name]    Note: This dictation was prepared with Dragon dictation along with smaller phrase technology. Any transcriptional errors that result from this process are unintentional.

## 2019-04-30 ENCOUNTER — Ambulatory Visit: Payer: Self-pay | Admitting: Pharmacist

## 2019-04-30 NOTE — Chronic Care Management (AMB) (Signed)
  Chronic Care Management   Note  04/30/2019 Name: [name] [name] MRN: [id number] DOB: 25-Aug-1940  [name] [name] is a 79 y.o. year old male who is a primary care patient of [name], [name]. The CCM team was consulted for assistance with chronic disease management and care coordination needs  Patient called me today to note that he received approval for [employer] assistance. I reviewed that the medication will ship to the office in 10-14 days from approval date. He verbalized understanding.   [name], PharmD, [place], CPP Clinical Pharmacist [place] [phone]

## 2019-05-03 ENCOUNTER — Encounter: Payer: Self-pay | Admitting: Family

## 2019-05-03 NOTE — Progress Notes (Signed)
Noted [name], NP

## 2019-05-04 ENCOUNTER — Telehealth: Payer: Self-pay

## 2019-05-04 NOTE — Telephone Encounter (Signed)
I called to let patient know that his Ozempic from [employer] Patient Assistance Program was here in POD B's refrigerator to pick up. Pt was screened & stated that he would come by today.   All medication is labeled with patient's name, DOB, sig. for prescription.

## 2019-05-04 NOTE — Telephone Encounter (Signed)
I have taped on each box of ozempic the patient name, DOB & sig. I called patient to let him know medication was here. It is placed in [facility] fridge with packing slip.

## 2019-05-10 ENCOUNTER — Other Ambulatory Visit: Payer: Self-pay | Admitting: Pharmacy Technician

## 2019-05-10 NOTE — Patient Outreach (Signed)
[name] [name] [facility]) Care Management  05/10/2019  [name] [name] 1940-05-05 [id number]    Successful call placed to patient's wife [name] [name] regarding patient assistance medication delivery of Ozempic with [employer], HIPAA identifiers verified.   [name] [name] confirmed that the patient picked up the Ozempic form the provider's office. Discussed refill procedure with patient's wife which requires patient to outreach provider when he opens his last box of medication to have them reach out to [employer] to request the refill. [employer] will not take refill requests from patients, the requests must be made by the provider's office. Patient's wife verbalized understanding. Confirmed patient had name and number as she had no other questions or concerns.  Follow up:  Will route note to embedded pharmacist [name] for case closure and will remove myself from care team.  Luiz Ochoa. Jeorgia Helming, [place]  [phone]

## 2019-05-13 ENCOUNTER — Ambulatory Visit (INDEPENDENT_AMBULATORY_CARE_PROVIDER_SITE_OTHER): Payer: Medicare Other | Admitting: Pharmacist

## 2019-05-13 ENCOUNTER — Other Ambulatory Visit: Payer: Self-pay | Admitting: Cardiovascular Disease

## 2019-05-13 DIAGNOSIS — E1165 Type 2 diabetes mellitus with hyperglycemia: Secondary | ICD-10-CM

## 2019-05-13 NOTE — Patient Instructions (Signed)
Visit Information  Goals Addressed            This Visit's Progress     Patient Stated   . "I want to work on my blood sugars" (pt-stated)       [place] (see longtitudinal plan of care for additional care plan information)  Current Barriers:  . Diabetes: uncontrolled; most recent [id number]; complicated by CAD, BPH o Reports that he received Hep A/B vaccine per GI, and associated lower sugars since receiving this vaccine o Reports that he received the new OneTouch Verio glucometer and test strips, but needs a prescription for lancets sent in . Current antihyperglycemic regimen: Ozempic 1 mg weekly (started this past week); glipizide XR 5 mg BID currently   o APPROVED for Ozempic assistance through [employer] through 01/14/20 o Has been unable to tolerate metformin, IR and ER, d/t diarrhea. Notes he developed diarrhea after a few years of tolerating metformin, but diarrhea did improve after metformin was discontinued. . Current glucose readings: o This week: fasting 131, 136, 146, 142, 155 o Pre lunch/pre supper: 152, 148, 128 . Cardiovascular risk reduction: o Current hypertensive regimen: HCTZ 25 mg QAM, lisinopril 10 mg QAM o Current hyperlipidemia regimen: rosuvastatin 10 mg daily, ezetimibe 10 mg daily; LDL at goal <70  Pharmacist Clinical Goal(s):  [name] Over the next 90 days, patient will work with PharmD and primary care provider to address optimized medication management  Interventions: . Comprehensive medication review performed, medication list updated in electronic medical record . Inter-disciplinary care team collaboration (see longitudinal plan of care) . Will collaborate w/ provider for lancets script . Given report of better sugar control, and just starting increased Ozempic dose, recommend continuing current regimen at this time. Patient will be due for A1c ~05/13/19. He has f/u with Dr. Rockey Situ on 5/5, he is going to plan to schedule f/u with PCP mid-May for  chronic disease state management  Patient Self Care Activities:  . Patient will check blood glucose BID , document, and provide at future appointments . Patient will take medications as prescribed . Patient will report any questions or concerns to provider   Please see past updates related to this goal by clicking on the "Past Updates" button in the selected goal         Patient verbalizes understanding of instructions provided today.    Plan:  - Scheduled f/u call in ~8 weeks  [name], PharmD, [place] [phone]

## 2019-05-13 NOTE — Chronic Care Management (AMB) (Signed)
Chronic Care Management   Follow Up Note   05/13/2019 Name: [name] [name] MRN: [id number] DOB: 1940-08-11  Referred by: [name], [name] Reason for referral : Chronic Care Management (Medication Management)   [name] [name] is a 79 y.o. year old male who is a primary care patient of [name], [name]. The CCM team was consulted for assistance with chronic disease management and care coordination needs.    Contacted patient for medication management review.   Review of patient status, including review of consultants reports, relevant laboratory and other test results, and collaboration with appropriate care team members and the patient's provider was performed as part of comprehensive patient evaluation and provision of chronic care management services.    SDOH (Social Determinants of Health) assessments performed: No See Care Plan activities for detailed interventions related to [facility])     Outpatient Encounter Medications as of 05/13/2019  Medication Sig Note  . acetaminophen (TYLENOL) 650 MG CR tablet Take 650 mg by mouth 2 (two) times a day.   [name] aspirin EC 81 MG tablet Take 81 mg by mouth daily.    . Blood Glucose Monitoring Suppl (ONETOUCH VERIO) w/Device KIT 1 Device by Does not apply route daily.   . carvedilol (COREG) 3.125 MG tablet TAKE 1 TABLET(3.125 MG) BY MOUTH TWICE DAILY   . ezetimibe (ZETIA) 10 MG tablet Take 1 tablet (10 mg total) by mouth daily.   . fluticasone (FLONASE) 50 MCG/ACT nasal spray Place 2 sprays into both nostrils daily. (Patient taking differently: Place 2 sprays into both nostrils daily as needed for allergies. )   . glipiZIDE (GLUCOTROL XL) 5 MG 24 hr tablet Take 1 tablet (5 mg total) by mouth 2 (two) times daily. With food   . glucose blood (ONETOUCH VERIO) test strip Used to check blood sugars twice a day.   . hydrochlorothiazide (HYDRODIURIL) 25 MG tablet TAKE 1 TABLET(25 MG) BY MOUTH DAILY   . Insulin Pen Needle (PEN NEEDLES) 30G X  8 MM MISC 1 Device by Does not apply route once a week.   [name] ipratropium (ATROVENT) 0.06 % nasal spray Place 2 sprays into both nostrils 3 (three) times daily.   [name] lisinopril (ZESTRIL) 10 MG tablet TAKE 1 TABLET BY MOUTH DAILY   . Multiple Vitamin (MULTIVITAMIN) capsule Take 1 capsule by mouth daily with lunch.    . naproxen sodium (ALEVE) 220 MG tablet Take 220 mg by mouth 2 (two) times daily as needed (for pain or headache). 05/13/2019: Seldom   . Omega-3 Fatty Acids (FISH OIL PO) Take 1 capsule by mouth daily.    . pantoprazole (PROTONIX) 40 MG tablet TAKE 1 TABLET BY MOUTH TWICE DAILY 05/13/2019: Just QAM  . Polyvinyl Alcohol-Povidone (MURINE TEARS FOR DRY EYES OP) Place 1-2 drops into both eyes daily as needed (for dry eyes).   . psyllium (METAMUCIL) 58.6 % powder Take 1 packet by mouth at bedtime.  07/13/2018: PRN  . rosuvastatin (CRESTOR) 10 MG tablet Take 1 tablet (10 mg total) by mouth daily.   . Semaglutide,0.25 or 0.'5MG'$ /DOS, (OZEMPIC, 0.25 OR 0.5 MG/DOSE,) 2 MG/1.5ML SOPN Inject 1 mg into the skin once a week.   . tamsulosin (FLOMAX) 0.4 MG CAPS capsule TAKE 1 CAPSULE(0.4 MG) BY MOUTH DAILY   . traMADol (ULTRAM) 50 MG tablet TAKE 1 TABLET BY MOUTH EVERY 12 HOURS AS NEEDED   . [DISCONTINUED] blood glucose meter kit and supplies KIT Dispense based on patient and insurance preference. Use up to four times  daily as directed. (FOR ICD-9 250.00, 250.01).   . [DISCONTINUED] Lancets MISC 1 Device by Does not apply route 2 (two) times daily.    No facility-administered encounter medications on file as of 05/13/2019.     Objective:   Goals Addressed            This Visit's Progress     Patient Stated   . "I want to work on my blood sugars" (pt-stated)       [place] (see longtitudinal plan of care for additional care plan information)  Current Barriers:  . Diabetes: uncontrolled; most recent I5O 2.7%; complicated by CAD, BPH o Reports that he received Hep A/B vaccine per GI, and  associated lower sugars since receiving this vaccine o Reports that he received the new OneTouch Verio glucometer and test strips, but needs a prescription for lancets sent in . Current antihyperglycemic regimen: Ozempic 1 mg weekly (started this past week); glipizide XR 5 mg BID currently   o APPROVED for Ozempic assistance through [employer] through 01/14/20 o Has been unable to tolerate metformin, IR and ER, d/t diarrhea. Notes he developed diarrhea after a few years of tolerating metformin, but diarrhea did improve after metformin was discontinued. . Current glucose readings: o This week: fasting 131, 136, 146, 142, 155 o Pre lunch/pre supper: 152, 148, 128 . Cardiovascular risk reduction: o Current hypertensive regimen: HCTZ 25 mg QAM, lisinopril 10 mg QAM o Current hyperlipidemia regimen: rosuvastatin 10 mg daily, ezetimibe 10 mg daily; LDL at goal <70  Pharmacist Clinical Goal(s):  [name] Over the next 90 days, patient will work with PharmD and primary care provider to address optimized medication management  Interventions: . Comprehensive medication review performed, medication list updated in electronic medical record . Inter-disciplinary care team collaboration (see longitudinal plan of care) . Will collaborate w/ provider for lancets script . Given report of better sugar control, and just starting increased Ozempic dose, recommend continuing current regimen at this time. Patient will be due for A1c ~05/13/19. He has f/u with Dr. Rockey Situ on 5/5, he is going to plan to schedule f/u with PCP mid-May for chronic disease state management  Patient Self Care Activities:  . Patient will check blood glucose BID , document, and provide at future appointments . Patient will take medications as prescribed . Patient will report any questions or concerns to provider   Please see past updates related to this goal by clicking on the "Past Updates" button in the selected goal          Plan:  -  Scheduled f/u call in ~8 weeks  [name], PharmD, [place] [phone]

## 2019-05-14 ENCOUNTER — Other Ambulatory Visit: Payer: Self-pay | Admitting: Family

## 2019-05-14 ENCOUNTER — Other Ambulatory Visit: Payer: Self-pay | Admitting: Cardiovascular Disease

## 2019-05-14 DIAGNOSIS — G8929 Other chronic pain: Secondary | ICD-10-CM

## 2019-05-14 NOTE — Telephone Encounter (Signed)
Refill request for Tramadol, last seen 02-26-19, last filled 03-14-19.  Please advise.

## 2019-05-17 ENCOUNTER — Other Ambulatory Visit: Payer: Self-pay

## 2019-05-17 ENCOUNTER — Encounter: Payer: Self-pay | Admitting: Family Medicine

## 2019-05-17 ENCOUNTER — Ambulatory Visit (INDEPENDENT_AMBULATORY_CARE_PROVIDER_SITE_OTHER): Payer: Medicare Other | Admitting: Family Medicine

## 2019-05-17 ENCOUNTER — Other Ambulatory Visit: Payer: Medicare Other

## 2019-05-17 DIAGNOSIS — M654 Radial styloid tenosynovitis [de Quervain]: Secondary | ICD-10-CM

## 2019-05-17 NOTE — Assessment & Plan Note (Signed)
Discussed icing.  Given cardiac history he will try to minimize his use of ibuprofen.  Discussed not using ibuprofen more than 400 mg at a time and not any more frequent than every 8 hours though I did advise that he not take it consistently every 8 hours.  He will be referred to orthopedics given significant discomfort with this.  Discussed that [facility] during the week between 1 PM and 7 PM.

## 2019-05-17 NOTE — Progress Notes (Signed)
  Tommi Rumps, MD Phone: [phone]  [name] [name] is a 79 y.o. male who presents today for same day visit.   Left wrist pain: Patient notes this started 2 to 3 days ago.  He did yard work 3 days ago and then over the next day or so developed pain along the radial aspect of his wrist into the base of his thumb.  He notes it is quite painful at times.  He tried ibuprofen which helps some hand icing.  No specific injury.  He does have history of osteoarthritis.  Social History   Tobacco Use  Smoking Status Current Some Day Smoker  . Types: Pipe  Smokeless Tobacco Former Systems developer  . Quit date: 10/17/2015     ROS see history of present illness  Objective  Physical Exam Vitals:   05/17/19 1526  BP: 130/80  Pulse: 65  Temp: (!) 96.8 F (36 C)  SpO2: 98%    BP Readings from Last 3 Encounters:  05/17/19 130/80  04/27/19 115/65  03/25/19 102/72   Wt Readings from Last 3 Encounters:  05/17/19 206 lb (93.4 kg)  04/27/19 206 lb (93.4 kg)  03/25/19 206 lb 3.2 oz (93.5 kg)    Physical Exam Constitutional:      General: He is not in acute distress. Musculoskeletal:     Comments: Patient with tenderness over the left radial styloid process and into the radial aspect of the base of his left thumb, mild swelling in this area, positive Finkelstein's, 5/5 grip strength bilaterally  Neurological:     Mental Status: He is alert.      Assessment/Plan: Please see individual problem list.  De Quervain's tenosynovitis, left Discussed icing.  Given cardiac history he will try to minimize his use of ibuprofen.  Discussed not using ibuprofen more than 400 mg at a time and not any more frequent than every 8 hours though I did advise that he not take it consistently every 8 hours.  He will be referred to orthopedics given significant discomfort with this.  Discussed that he could go to [facility] during the week between 1 PM and 7 PM.  Orders Placed  This Encounter  Procedures  . Ambulatory referral to Orthopedic Surgery    Referral Priority:   Routine    Referral Type:   Surgical    Referral Reason:   Specialty Services Required    Requested Specialty:   Orthopedic Surgery    Number of Visits Requested:   1    No orders of the defined types were placed in this encounter.   This visit occurred during the SARS-CoV-2 public health emergency.  Safety protocols were in place, including screening questions prior to the visit, additional usage of staff PPE, and extensive cleaning of exam room while observing appropriate contact time as indicated for disinfecting solutions.    Tommi Rumps, MD [place]

## 2019-05-17 NOTE — Patient Instructions (Signed)
Nice to see you. Please ice the area for 10 minutes at a time 3-4 times daily. Please try to limit the amount of ibuprofen you use. I have placed an orthopedics referral though if needed you can go to [facility] off of [facility], Monday through Friday between 1 PM and 7 PM.

## 2019-05-18 ENCOUNTER — Other Ambulatory Visit: Payer: Self-pay | Admitting: Family

## 2019-05-18 ENCOUNTER — Other Ambulatory Visit: Payer: Self-pay

## 2019-05-18 MED ORDER — ONETOUCH DELICA LANCETS 33G MISC: 5 refills | Status: DC

## 2019-05-18 MED ORDER — ONETOUCH VERIO VI STRP: ORAL_STRIP | 12 refills | Status: DC

## 2019-05-18 NOTE — Progress Notes (Signed)
Agree with plan. Haiven Nardone, NP  

## 2019-05-18 NOTE — Progress Notes (Signed)
Date:  05/18/2019   ID:  [name] [name], DOB [birth year], MRN [id number]  Patient Location:  [address] [place] [address]   Provider location:   [facility], [place] office  PCP:  [name], [name]  Cardiologist:  Arvid Right [facility]  Chief Complaint  Patient presents with  . other    12 month follow up. Meds reviewed by the pt. verbally. Pt. c/o elevated liver enzymes and elevated A1C & would like to discuss the crestor.       History of Present Illness:    [name] [name] is a 79 y.o. male  past medical history of CAD  status post MI DM II HTN Hyperlipidemia Melanoma Abdominal aorta atherosclerosis stenting was 1991, and  2005 , symptoms  nausea and jaw pain.  Who presents for routine follow-up of his coronary artery disease  In follow-up today he reports he is feeling well Denies any significant anginal symptoms  On review of lab work, LFTS mildly elevated, Stable 3 months Started walking and changed diet followed by GI  Initial blood pressure low, on my recheck 169 systolic/improved BP 678/93 at home No orthostasis  Yard work friday Injury left wrist  last Friday Left wrist pain Scheduled to see orthopedics  Active in the garden Hemoglobin A1c 7.9  trying to change his diet get his weight down LDL 55 on Crestor 10 with Zetia  Prior CV studies:   The following studies were reviewed today:  Pharmacologic nuclear stress test 03/26/2013,Lexiscan Old inferior MI. No ischemia. Well-preserved LVEF 50%. Slight hypokinesia of the inferior wall.  Echo 03/26/2013: LV borderline dilated. EF 50-55%. Mild global hypokinesis of LV. LA is moderately dilated. RV mildly dilated. Mild to moderate aortic sclerosis. Mild AI. Mild to moderate mitral valve thickening. Mild to moderate MR. RVSP 31 mmHg. Mild TR. Aortic root is normal in size. No pericardial effusion.   Past Medical History:  Diagnosis Date  . Aortic insufficiency      a. noted on TTE 2015  . Arthritis   . CAD (coronary artery disease)    a. remote PCI in 1991 and 2005; b. MV 3/15: old inferior MI, no ischemia, LVEF 50%, slight inferior wall hypokniesis  . Chicken pox   . Colon polyps    4 pre-cancerous   . Diverticulitis   . DM type 2 (diabetes mellitus, type 2) ([place])   . Family history of adverse reaction to anesthesia   . GERD (gastroesophageal reflux disease)   . Heart murmur   . History of kidney stones   . Hyperlipidemia   . Hypertension   . Kidney stones   . Melanoma ([place]) 1980   Resected from his back  . Mitral regurgitation    a. noted on TTE 2015  . Myocardial infarction ([place])   . Systolic dysfunction    a. TTE 2015: EF  50-55%, mild global hypokinesis, mild to moderate aortic sclerosis, mild aortic insufficiency, mild to moderate mitral regurgitation, mild tricuspid regurgitation, moderately dilated left atrium, mildly dilated right ventricle, PASP 31 mmHg   Past Surgical History:  Procedure Laterality Date  . CHOLECYSTECTOMY  2012  . COLONOSCOPY     in 2003 with polyp removed and leak anastomosis had to have open abdominal surgery   . CORONARY ANGIOPLASTY WITH STENT PLACEMENT  1991 & 2005  . CYSTOSCOPY/URETEROSCOPY/HOLMIUM LASER/STENT PLACEMENT Right 01/02/2018   Procedure: CYSTOSCOPY/URETEROSCOPY/HOLMIUM LASER/STENT PLACEMENT;  Surgeon: [name], MD;  Location: [facility] ORS;  Service:  Urology;  Laterality: Right;  . ESOPHAGOGASTRODUODENOSCOPY (EGD) WITH PROPOFOL N/A 11/19/2016   Procedure: ESOPHAGOGASTRODUODENOSCOPY (EGD) WITH PROPOFOL;  Surgeon: [name], MD;  Location: [facility] ENDOSCOPY;  Service: Endoscopy;  Laterality: N/A;  . ESOPHAGOGASTRODUODENOSCOPY (EGD) WITH PROPOFOL N/A 02/02/2019   Procedure: ESOPHAGOGASTRODUODENOSCOPY (EGD) WITH PROPOFOL;  Surgeon: [name], MD;  Location: [facility] ENDOSCOPY;  Service: Endoscopy;  Laterality: N/A;  . LITHOTRIPSY  2015  . MELANOMA EXCISION  1980   malignant  . NERVE SURGERY   2015   ulna nerve  . TONSILLECTOMY  1945  . WISDOM TOOTH EXTRACTION       No outpatient medications have been marked as taking for the 05/19/19 encounter (Appointment) with [name], MD.     Allergies:   Metformin and related and Azithromycin   Social History   Tobacco Use  . Smoking status: Current Some Day Smoker    Types: Pipe  . Smokeless tobacco: Former Systems developer    Quit date: 10/17/2015  Substance Use Topics  . Alcohol use: Yes    Alcohol/week: 0.0 - 1.0 standard drinks    Comment: none last 24hrs  . Drug use: No     Current Outpatient Medications on File Prior to Visit  Medication Sig Dispense Refill  . acetaminophen (TYLENOL) 650 MG CR tablet Take 650 mg by mouth 2 (two) times a day.    [name] aspirin EC 81 MG tablet Take 81 mg by mouth daily.     . Blood Glucose Monitoring Suppl (ONETOUCH VERIO) w/Device KIT 1 Device by Does not apply route daily. 1 kit 0  . carvedilol (COREG) 3.125 MG tablet TAKE 1 TABLET(3.125 MG) BY MOUTH TWICE DAILY 180 tablet 1  . ezetimibe (ZETIA) 10 MG tablet TAKE 1 TABLET(10 MG) BY MOUTH DAILY 90 tablet 0  . fluticasone (FLONASE) 50 MCG/ACT nasal spray Place 2 sprays into both nostrils daily. (Patient taking differently: Place 2 sprays into both nostrils daily as needed for allergies. ) 16 g 6  . glipiZIDE (GLUCOTROL XL) 5 MG 24 hr tablet Take 1 tablet (5 mg total) by mouth 2 (two) times daily. With food 180 tablet 3  . glucose blood (ONETOUCH VERIO) test strip Used to check blood sugars twice a day. 100 each 12  . hydrochlorothiazide (HYDRODIURIL) 25 MG tablet TAKE 1 TABLET(25 MG) BY MOUTH DAILY 90 tablet 0  . Insulin Pen Needle (PEN NEEDLES) 30G X 8 MM MISC 1 Device by Does not apply route once a week. 90 each 3  . ipratropium (ATROVENT) 0.06 % nasal spray Place 2 sprays into both nostrils 3 (three) times daily. 15 mL 12  . lisinopril (ZESTRIL) 10 MG tablet TAKE 1 TABLET BY MOUTH DAILY 90 tablet 1  . Multiple Vitamin (MULTIVITAMIN) capsule Take 1  capsule by mouth daily with lunch.     . naproxen sodium (ALEVE) 220 MG tablet Take 220 mg by mouth 2 (two) times daily as needed (for pain or headache).    . Omega-3 Fatty Acids (FISH OIL PO) Take 1 capsule by mouth daily.     Glory Rosebush Delica Lancets 16X MISC Used to check blood sugars twice a day. 200 each 5  . pantoprazole (PROTONIX) 40 MG tablet TAKE 1 TABLET BY MOUTH TWICE DAILY 180 tablet 0  . Polyvinyl Alcohol-Povidone (MURINE TEARS FOR DRY EYES OP) Place 1-2 drops into both eyes daily as needed (for dry eyes).    . psyllium (METAMUCIL) 58.6 % powder Take 1 packet by mouth at bedtime.     [name]  rosuvastatin (CRESTOR) 10 MG tablet TAKE 1 TABLET(10 MG) BY MOUTH DAILY 90 tablet 0  . Semaglutide,0.25 or 0.5MG/DOS, (OZEMPIC, 0.25 OR 0.5 MG/DOSE,) 2 MG/1.5ML SOPN Inject 1 mg into the skin once a week. 3 pen 3  . tamsulosin (FLOMAX) 0.4 MG CAPS capsule TAKE 1 CAPSULE(0.4 MG) BY MOUTH DAILY 90 capsule 3  . traMADol (ULTRAM) 50 MG tablet TAKE 1 TABLET BY MOUTH EVERY 12 HOURS AS NEEDED 60 tablet 1   No current facility-administered medications on file prior to visit.     Family Hx: The patient's family history includes Colon cancer in his father; Diabetes in his father and mother; Heart attack in his mother; Heart disease in his mother; Hyperlipidemia in his mother; Hypertension in his mother; Kidney cancer in his father; Liver cancer in his father; Lung cancer in his father. There is no history of Bladder Cancer or Prostate cancer.  ROS:   Please see the history of present illness.    Review of Systems  Constitutional: Negative.   HENT: Negative.   Respiratory: Negative.   Cardiovascular: Negative.   Gastrointestinal: Negative.   Musculoskeletal:       Left wrist pain  Neurological: Negative.   Psychiatric/Behavioral: Negative.   All other systems reviewed and are negative.    Labs/Other Tests and Data Reviewed:    Recent Labs: 03/03/2019: BUN 20; Creatinine, Ser 0.78; Potassium 4.0;  Sodium 137 03/25/2019: ALT 59   Recent Lipid Panel Lab Results  Component Value Date/Time   CHOL 120 02/10/2019 09:03 AM   TRIG 215.0 (H) 02/10/2019 09:03 AM   HDL 39.00 (L) 02/10/2019 09:03 AM   CHOLHDL 3 02/10/2019 09:03 AM   LDLCALC 70 03/02/2018 08:22 AM   LDLDIRECT 55.0 02/10/2019 09:03 AM    Wt Readings from Last 3 Encounters:  05/17/19 206 lb (93.4 kg)  04/27/19 206 lb (93.4 kg)  03/25/19 206 lb 3.2 oz (93.5 kg)     Exam:    BP 114/75 (BP Location: Left Arm, Patient Position: Sitting, Cuff Size: Normal)   Pulse 84   Ht _0  (1.854 m)   Wt 204 lb (92.5 kg)   SpO2 98%   BMI 26.91 kg/m   Constitutional:  oriented to person, place, and time. No distress.  HENT:  [name]: Grossly normal Eyes:  no discharge. No scleral icterus.  Neck: No JVD, no carotid bruits  Cardiovascular: Regular rate and rhythm, no murmurs appreciated Pulmonary/Chest: Clear to auscultation bilaterally, no wheezes or rails Abdominal: Soft.  no distension.  no tenderness.  Musculoskeletal: Normal range of motion Neurological:  normal muscle tone. Coordination normal. No atrophy Skin: Skin warm and dry Psychiatric: normal affect, pleasant   ASSESSMENT & PLAN:    Atherosclerosis of native coronary artery with stable angina pectoris, unspecified whether native or transplanted heart [facility]) Currently with no symptoms of angina. No further workup at this time. Continue current medication regimen.  Essential hypertension Blood pressure somewhat low Recommend on days when he is working out outside/in the garden he could use less HCTZ  Bradycardia Heart rate stable Rare PVCs, asymptomatic  Atherosclerosis of abdominal aorta (HCC) Mildly elevated LFTs, possibly from NASH He is watching his diet, walking program If needed could cut down on the dose of Crestor  Type 2 diabetes mellitus with other circulatory complication, without long-term current use of insulin (HCC) A1c running high, he is  watching his diet started walking program  Carotid artery disease, unspecified laterality ([place]) U/s in 2017 Minor carotid atherosclerosis. No  hemodynamically significant ICA stenosis. Degree of narrowing less than 50% bilaterally.  Left wrist pain Injury, possibly caused by gardening last Friday ligamental strain perhaps Scheduled to see orthopedics    Total encounter time more than 25 minutes  Greater than 50% was spent in counseling and coordination of care with the patient  Disposition: Follow-up in 12 months   Signed, Ida Rogue, MD  05/18/2019 7:02 PM    [place] [facility] [address], [place], [place] [address]

## 2019-05-19 ENCOUNTER — Encounter: Payer: Self-pay | Admitting: Cardiovascular Disease

## 2019-05-19 ENCOUNTER — Other Ambulatory Visit: Payer: Self-pay

## 2019-05-19 ENCOUNTER — Ambulatory Visit (INDEPENDENT_AMBULATORY_CARE_PROVIDER_SITE_OTHER): Payer: Medicare Other | Admitting: Cardiovascular Disease

## 2019-05-19 VITALS — BP 114/75 | HR 84 | Ht 73.0 in | Wt 204.0 lb

## 2019-05-19 DIAGNOSIS — R001 Bradycardia, unspecified: Secondary | ICD-10-CM | POA: Diagnosis not present

## 2019-05-19 DIAGNOSIS — I10 Essential (primary) hypertension: Secondary | ICD-10-CM

## 2019-05-19 DIAGNOSIS — I700 Atherosclerosis of aorta: Secondary | ICD-10-CM

## 2019-05-19 DIAGNOSIS — I25118 Atherosclerotic heart disease of native coronary artery with other forms of angina pectoris: Secondary | ICD-10-CM

## 2019-05-19 DIAGNOSIS — E1159 Type 2 diabetes mellitus with other circulatory complications: Secondary | ICD-10-CM | POA: Diagnosis not present

## 2019-05-19 DIAGNOSIS — I519 Heart disease, unspecified: Secondary | ICD-10-CM

## 2019-05-19 DIAGNOSIS — I779 Disorder of arteries and arterioles, unspecified: Secondary | ICD-10-CM

## 2019-05-19 NOTE — Patient Instructions (Signed)

## 2019-05-20 DIAGNOSIS — S62015D Nondisplaced fracture of distal pole of navicular [scaphoid] bone of left wrist, subsequent encounter for fracture with routine healing: Secondary | ICD-10-CM | POA: Diagnosis not present

## 2019-05-25 ENCOUNTER — Ambulatory Visit: Payer: Medicare Other | Admitting: Gastroenterology

## 2019-05-28 ENCOUNTER — Other Ambulatory Visit: Payer: Self-pay | Admitting: Cardiovascular Disease

## 2019-06-01 DIAGNOSIS — S62015D Nondisplaced fracture of distal pole of navicular [scaphoid] bone of left wrist, subsequent encounter for fracture with routine healing: Secondary | ICD-10-CM | POA: Diagnosis not present

## 2019-06-10 DIAGNOSIS — M545 Low back pain: Secondary | ICD-10-CM | POA: Diagnosis not present

## 2019-06-10 DIAGNOSIS — M25552 Pain in left hip: Secondary | ICD-10-CM | POA: Diagnosis not present

## 2019-06-11 ENCOUNTER — Encounter: Payer: Self-pay | Admitting: Urology

## 2019-06-17 ENCOUNTER — Ambulatory Visit: Payer: Self-pay | Admitting: Pharmacist

## 2019-06-17 DIAGNOSIS — E1165 Type 2 diabetes mellitus with hyperglycemia: Secondary | ICD-10-CM

## 2019-06-17 NOTE — Patient Instructions (Signed)
Visit Information  Goals Addressed            This Visit's Progress     Patient Stated   . "I want to work on my blood sugars" (pt-stated)       [name] [name] (see longtitudinal plan of care for additional care plan information)  Current Barriers:  . Diabetes: uncontrolled; most recent [id number]; complicated by CAD, BPH o Calls today to report he is interested in scheduling an A1c lab test  . Current antihyperglycemic regimen: Ozempic 1 mg weekly; glipizide XR 5 mg BID currently   o APPROVED for Ozempic assistance through [employer] through 01/14/20 o Has been unable to tolerate metformin, IR and ER, d/t diarrhea. Notes he developed diarrhea after a few years of tolerating metformin, but diarrhea did improve after metformin was discontinued. . Cardiovascular risk reduction: o Current hypertensive regimen: HCTZ 25 mg QAM, lisinopril 10 mg QAM o Current hyperlipidemia regimen: rosuvastatin 10 mg daily, ezetimibe 10 mg daily; LDL at goal <70  Pharmacist Clinical Goal(s):  [name] Over the next 90 days, patient will work with PharmD and primary care provider to address optimized medication management  Interventions: . Due for f/u with PCP. Will collaborate w/ office staff to contact patient to schedule an appointment w/ [name], and collaborate w/ CMA to determine if PCP would like patient scheduled for lab work beforehand to review at appointment.   Patient Self Care Activities:  . Patient will check blood glucose BID , document, and provide at future appointments . Patient will take medications as prescribed . Patient will report any questions or concerns to provider   Please see past updates related to this goal by clicking on the "Past Updates" button in the selected goal         Patient verbalizes understanding of instructions provided today.   Plan:  - Will outreach as previously scheduled  [name], PharmD, [place], [place] Pharmacist [place] [phone]

## 2019-06-17 NOTE — Chronic Care Management (AMB) (Signed)
Chronic Care Management   Follow Up Note   06/17/2019 Name: [name] [name] MRN: [id number] DOB: 19-Jul-1940  Referred by: [name], [name] Reason for referral : Chronic Care Management (Medication Management)   [name] [name] is a 79 y.o. year old male who is a primary care patient of [name], [name]. The CCM team was consulted for assistance with chronic disease management and care coordination needs.    Received call from patient today.   Review of patient status, including review of consultants reports, relevant laboratory and other test results, and collaboration with appropriate care team members and the patient's provider was performed as part of comprehensive patient evaluation and provision of chronic care management services.    SDOH (Social Determinants of Health) assessments performed: No See Care Plan activities for detailed interventions related to [facility])     Outpatient Encounter Medications as of 06/17/2019  Medication Sig Note  . acetaminophen (TYLENOL) 650 MG CR tablet Take 650 mg by mouth 2 (two) times a day.   [name] aspirin EC 81 MG tablet Take 81 mg by mouth daily.    . Blood Glucose Monitoring Suppl (ONETOUCH VERIO) w/Device KIT 1 Device by Does not apply route daily.   . carvedilol (COREG) 3.125 MG tablet TAKE 1 TABLET(3.125 MG) BY MOUTH TWICE DAILY   . ezetimibe (ZETIA) 10 MG tablet TAKE 1 TABLET(10 MG) BY MOUTH DAILY   . fluticasone (FLONASE) 50 MCG/ACT nasal spray Place 2 sprays into both nostrils daily. (Patient taking differently: Place 2 sprays into both nostrils daily as needed for allergies. )   . glipiZIDE (GLUCOTROL XL) 5 MG 24 hr tablet Take 1 tablet (5 mg total) by mouth 2 (two) times daily. With food   . glucose blood (ONETOUCH VERIO) test strip Used to check blood sugars twice a day.   . hydrochlorothiazide (HYDRODIURIL) 25 MG tablet TAKE 1 TABLET(25 MG) BY MOUTH DAILY   . Insulin Pen Needle (PEN NEEDLES) 30G X 8 MM MISC 1 Device by Does  not apply route once a week.   [name] ipratropium (ATROVENT) 0.06 % nasal spray Place 2 sprays into both nostrils 3 (three) times daily.   [name] lisinopril (ZESTRIL) 10 MG tablet TAKE 1 TABLET BY MOUTH DAILY   . Multiple Vitamin (MULTIVITAMIN) capsule Take 1 capsule by mouth daily with lunch.    . naproxen sodium (ALEVE) 220 MG tablet Take 220 mg by mouth 2 (two) times daily as needed (for pain or headache). 05/13/2019: Seldom   . Omega-3 Fatty Acids (FISH OIL PO) Take 1 capsule by mouth daily.    Glory Rosebush Delica Lancets 06T MISC Used to check blood sugars twice a day.   . pantoprazole (PROTONIX) 40 MG tablet TAKE 1 TABLET BY MOUTH TWICE DAILY 05/13/2019: Just QAM  . Polyvinyl Alcohol-Povidone (MURINE TEARS FOR DRY EYES OP) Place 1-2 drops into both eyes daily as needed (for dry eyes).   . psyllium (METAMUCIL) 58.6 % powder Take 1 packet by mouth at bedtime.  07/13/2018: PRN  . rosuvastatin (CRESTOR) 10 MG tablet TAKE 1 TABLET(10 MG) BY MOUTH DAILY   . Semaglutide,0.25 or 0.5MG/DOS, (OZEMPIC, 0.25 OR 0.5 MG/DOSE,) 2 MG/1.5ML SOPN Inject 1 mg into the skin once a week.   . tamsulosin (FLOMAX) 0.4 MG CAPS capsule TAKE 1 CAPSULE(0.4 MG) BY MOUTH DAILY   . traMADol (ULTRAM) 50 MG tablet TAKE 1 TABLET BY MOUTH EVERY 12 HOURS AS NEEDED    No facility-administered encounter medications on file as of 06/17/2019.  Objective:   Goals Addressed            This Visit's Progress     Patient Stated   . "I want to work on my blood sugars" (pt-stated)       [place] (see longtitudinal plan of care for additional care plan information)  Current Barriers:  . Diabetes: uncontrolled; most recent A7G 8.1%; complicated by CAD, BPH o Calls today to report he is interested in scheduling an A1c lab test  . Current antihyperglycemic regimen: Ozempic 1 mg weekly; glipizide XR 5 mg BID currently   o APPROVED for Ozempic assistance through [employer] through 01/14/20 o Has been unable to tolerate metformin,  IR and ER, d/t diarrhea. Notes he developed diarrhea after a few years of tolerating metformin, but diarrhea did improve after metformin was discontinued. . Cardiovascular risk reduction: o Current hypertensive regimen: HCTZ 25 mg QAM, lisinopril 10 mg QAM o Current hyperlipidemia regimen: rosuvastatin 10 mg daily, ezetimibe 10 mg daily; LDL at goal <70  Pharmacist Clinical Goal(s):  [name] Over the next 90 days, patient will work with PharmD and primary care provider to address optimized medication management  Interventions: . Due for f/u with PCP. Will collaborate w/ office staff to contact patient to schedule an appointment w/ [name], and collaborate w/ CMA to determine if PCP would like patient scheduled for lab work beforehand to review at appointment.   Patient Self Care Activities:  . Patient will check blood glucose BID , document, and provide at future appointments . Patient will take medications as prescribed . Patient will report any questions or concerns to provider   Please see past updates related to this goal by clicking on the "Past Updates" button in the selected goal          Plan:  - Will outreach as previously scheduled  [name], PharmD, [place], [place] Pharmacist [place] [phone]

## 2019-06-22 DIAGNOSIS — S62015D Nondisplaced fracture of distal pole of navicular [scaphoid] bone of left wrist, subsequent encounter for fracture with routine healing: Secondary | ICD-10-CM | POA: Diagnosis not present

## 2019-07-01 ENCOUNTER — Ambulatory Visit: Payer: Medicare Other | Admitting: Dermatology

## 2019-07-01 ENCOUNTER — Other Ambulatory Visit: Payer: Self-pay

## 2019-07-01 DIAGNOSIS — L820 Inflamed seborrheic keratosis: Secondary | ICD-10-CM

## 2019-07-01 DIAGNOSIS — L249 Irritant contact dermatitis, unspecified cause: Secondary | ICD-10-CM

## 2019-07-01 DIAGNOSIS — L578 Other skin changes due to chronic exposure to nonionizing radiation: Secondary | ICD-10-CM | POA: Diagnosis not present

## 2019-07-01 DIAGNOSIS — L821 Other seborrheic keratosis: Secondary | ICD-10-CM | POA: Diagnosis not present

## 2019-07-01 DIAGNOSIS — Z85820 Personal history of malignant melanoma of skin: Secondary | ICD-10-CM | POA: Diagnosis not present

## 2019-07-01 NOTE — Progress Notes (Signed)
   New Patient Visit  Subjective  [name] [name] is a 79 y.o. male who presents for the following: lesion (R post auricular and L elbow - patient has a history of MM and SCC is concerned and would like areas checked).  The following portions of the chart were reviewed this encounter and updated as appropriate:  Tobacco  Allergies  Meds  Problems  Med Hx  Surg Hx  Fam Hx      Review of Systems:  No other skin or systemic complaints except as noted in HPI or Assessment and Plan.  Objective  Well appearing patient in no apparent distress; mood and affect are within normal limits.  A focused examination was performed including the right ear and left elbow. Relevant physical exam findings are noted in the Assessment and Plan.  Objective  R infra auricular below the ear lobe: Erythematous keratotic or waxy stuck-on papule or plaque.   Objective  Left Elbow - Posterior: 2.2 cm area of erythema and crusting depth of 0.1 cm    Assessment & Plan    Inflamed seborrheic keratosis R infra auricular below the ear lobe  Destruction of lesion - R infra auricular below the ear lobe Complexity: simple   Destruction method: cryotherapy   Informed consent: discussed and consent obtained   Timeout:  patient name, date of birth, surgical site, and procedure verified Lesion destroyed using liquid nitrogen: Yes   Region frozen until ice ball extended beyond lesion: Yes   Outcome: patient tolerated procedure well with no complications   Post-procedure details: wound care instructions given    Irritant contact dermatitis, unspecified trigger Left Elbow - Posterior  Area cleansed and debrided with Puracyn and Duoderm applied to aa. Reapply in one week - will send in rx to [facility].   Seborrheic Keratoses - Stuck-on, waxy, tan-brown papules and plaques  - Discussed benign etiology and prognosis. - Observe - Call for any changes  Actinic Damage - diffuse scaly erythematous macules with  underlying dyspigmentation - Recommend daily broad spectrum sunscreen SPF 30+ to sun-exposed areas, reapply every 2 hours as needed.  - Call for new or changing lesions.  History of Melanoma - No evidence of recurrence today - No lymphadenopathy - Recommend regular full body skin exams - Recommend daily broad spectrum sunscreen SPF 30+ to sun-exposed areas, reapply every 2 hours as needed.  - Call if any new or changing lesions are noted between office visits   Return in about 6 months (around 12/31/2019) for TBSE - Hx MM and SCC.  Luther Redo, CMA, am acting as scribe for Sarina Ser, MD .  Documentation: I have reviewed the above documentation for accuracy and completeness, and I agree with the above.  Sarina Ser, MD

## 2019-07-04 ENCOUNTER — Encounter: Payer: Self-pay | Admitting: Dermatology

## 2019-07-06 DIAGNOSIS — S51002A Unspecified open wound of left elbow, initial encounter: Secondary | ICD-10-CM | POA: Diagnosis not present

## 2019-07-07 ENCOUNTER — Observation Stay
Admission: EM | Admit: 2019-07-07 | Discharge: 2019-07-07 | Disposition: A | Discharge status: Nursing Home (Includes ICF) | Payer: Medicare Other | Attending: Internal Medicine | Admitting: Internal Medicine

## 2019-07-07 ENCOUNTER — Encounter: Payer: Self-pay | Admitting: Emergency Medicine

## 2019-07-07 ENCOUNTER — Inpatient Hospital Stay (HOSPITAL_COMMUNITY)
Admission: AD | Admit: 2019-07-07 | Discharge: 2019-07-09 | DRG: 247 | Disposition: A | Discharge status: Home / Self Care | Payer: Medicare Other | Source: Other Acute Inpatient Hospital | Attending: Cardiology | Admitting: Cardiology

## 2019-07-07 ENCOUNTER — Observation Stay (HOSPITAL_COMMUNITY)
Admit: 2019-07-07 | Discharge: 2019-07-07 | Disposition: A | Discharge status: Home / Self Care | Payer: Medicare Other | Attending: Nurse Practitioner | Admitting: Nurse Practitioner

## 2019-07-07 ENCOUNTER — Encounter
Admission: EM | Disposition: A | Discharge status: Nursing Home (Includes ICF) | Payer: Self-pay | Source: Home / Self Care | Attending: Emergency Medicine

## 2019-07-07 ENCOUNTER — Other Ambulatory Visit: Payer: Self-pay

## 2019-07-07 ENCOUNTER — Encounter (HOSPITAL_COMMUNITY): Payer: Self-pay | Admitting: Cardiology

## 2019-07-07 DIAGNOSIS — F17290 Nicotine dependence, other tobacco product, uncomplicated: Secondary | ICD-10-CM | POA: Insufficient documentation

## 2019-07-07 DIAGNOSIS — I25110 Atherosclerotic heart disease of native coronary artery with unstable angina pectoris: Secondary | ICD-10-CM | POA: Diagnosis not present

## 2019-07-07 DIAGNOSIS — Z83438 Family history of other disorder of lipoprotein metabolism and other lipidemia: Secondary | ICD-10-CM

## 2019-07-07 DIAGNOSIS — I2510 Atherosclerotic heart disease of native coronary artery without angina pectoris: Secondary | ICD-10-CM

## 2019-07-07 DIAGNOSIS — Z8719 Personal history of other diseases of the digestive system: Secondary | ICD-10-CM

## 2019-07-07 DIAGNOSIS — Z85820 Personal history of malignant melanoma of skin: Secondary | ICD-10-CM | POA: Diagnosis not present

## 2019-07-07 DIAGNOSIS — I252 Old myocardial infarction: Secondary | ICD-10-CM

## 2019-07-07 DIAGNOSIS — Z79899 Other long term (current) drug therapy: Secondary | ICD-10-CM | POA: Diagnosis not present

## 2019-07-07 DIAGNOSIS — I2541 Coronary artery aneurysm: Secondary | ICD-10-CM | POA: Diagnosis not present

## 2019-07-07 DIAGNOSIS — Z20822 Contact with and (suspected) exposure to covid-19: Secondary | ICD-10-CM | POA: Insufficient documentation

## 2019-07-07 DIAGNOSIS — I255 Ischemic cardiomyopathy: Secondary | ICD-10-CM | POA: Diagnosis not present

## 2019-07-07 DIAGNOSIS — Z881 Allergy status to other antibiotic agents status: Secondary | ICD-10-CM | POA: Insufficient documentation

## 2019-07-07 DIAGNOSIS — E1169 Type 2 diabetes mellitus with other specified complication: Secondary | ICD-10-CM | POA: Diagnosis not present

## 2019-07-07 DIAGNOSIS — I471 Supraventricular tachycardia: Secondary | ICD-10-CM | POA: Insufficient documentation

## 2019-07-07 DIAGNOSIS — R079 Chest pain, unspecified: Secondary | ICD-10-CM

## 2019-07-07 DIAGNOSIS — Z87442 Personal history of urinary calculi: Secondary | ICD-10-CM

## 2019-07-07 DIAGNOSIS — I472 Ventricular tachycardia, unspecified: Secondary | ICD-10-CM

## 2019-07-07 DIAGNOSIS — Z800 Family history of malignant neoplasm of digestive organs: Secondary | ICD-10-CM

## 2019-07-07 DIAGNOSIS — I10 Essential (primary) hypertension: Secondary | ICD-10-CM | POA: Diagnosis not present

## 2019-07-07 DIAGNOSIS — R0789 Other chest pain: Secondary | ICD-10-CM | POA: Diagnosis not present

## 2019-07-07 DIAGNOSIS — I2584 Coronary atherosclerosis due to calcified coronary lesion: Secondary | ICD-10-CM | POA: Insufficient documentation

## 2019-07-07 DIAGNOSIS — E119 Type 2 diabetes mellitus without complications: Secondary | ICD-10-CM | POA: Diagnosis not present

## 2019-07-07 DIAGNOSIS — Z833 Family history of diabetes mellitus: Secondary | ICD-10-CM

## 2019-07-07 DIAGNOSIS — Z888 Allergy status to other drugs, medicaments and biological substances status: Secondary | ICD-10-CM

## 2019-07-07 DIAGNOSIS — E785 Hyperlipidemia, unspecified: Secondary | ICD-10-CM | POA: Diagnosis not present

## 2019-07-07 DIAGNOSIS — K219 Gastro-esophageal reflux disease without esophagitis: Secondary | ICD-10-CM | POA: Diagnosis not present

## 2019-07-07 DIAGNOSIS — Z8249 Family history of ischemic heart disease and other diseases of the circulatory system: Secondary | ICD-10-CM

## 2019-07-07 DIAGNOSIS — Z7982 Long term (current) use of aspirin: Secondary | ICD-10-CM | POA: Insufficient documentation

## 2019-07-07 DIAGNOSIS — E118 Type 2 diabetes mellitus with unspecified complications: Secondary | ICD-10-CM | POA: Diagnosis present

## 2019-07-07 DIAGNOSIS — E782 Mixed hyperlipidemia: Secondary | ICD-10-CM | POA: Diagnosis present

## 2019-07-07 DIAGNOSIS — Z955 Presence of coronary angioplasty implant and graft: Secondary | ICD-10-CM | POA: Insufficient documentation

## 2019-07-07 DIAGNOSIS — I214 Non-ST elevation (NSTEMI) myocardial infarction: Principal | ICD-10-CM | POA: Diagnosis present

## 2019-07-07 DIAGNOSIS — I25118 Atherosclerotic heart disease of native coronary artery with other forms of angina pectoris: Secondary | ICD-10-CM | POA: Diagnosis not present

## 2019-07-07 DIAGNOSIS — I499 Cardiac arrhythmia, unspecified: Secondary | ICD-10-CM | POA: Diagnosis not present

## 2019-07-07 DIAGNOSIS — I447 Left bundle-branch block, unspecified: Secondary | ICD-10-CM | POA: Insufficient documentation

## 2019-07-07 DIAGNOSIS — Z9049 Acquired absence of other specified parts of digestive tract: Secondary | ICD-10-CM

## 2019-07-07 DIAGNOSIS — R001 Bradycardia, unspecified: Secondary | ICD-10-CM | POA: Diagnosis present

## 2019-07-07 DIAGNOSIS — M1990 Unspecified osteoarthritis, unspecified site: Secondary | ICD-10-CM | POA: Insufficient documentation

## 2019-07-07 DIAGNOSIS — Z885 Allergy status to narcotic agent status: Secondary | ICD-10-CM | POA: Diagnosis not present

## 2019-07-07 DIAGNOSIS — I253 Aneurysm of heart: Secondary | ICD-10-CM | POA: Insufficient documentation

## 2019-07-07 DIAGNOSIS — Z794 Long term (current) use of insulin: Secondary | ICD-10-CM | POA: Insufficient documentation

## 2019-07-07 DIAGNOSIS — Z801 Family history of malignant neoplasm of trachea, bronchus and lung: Secondary | ICD-10-CM

## 2019-07-07 DIAGNOSIS — Z8051 Family history of malignant neoplasm of kidney: Secondary | ICD-10-CM

## 2019-07-07 HISTORY — DX: Unspecified hearing loss, unspecified ear: H91.90

## 2019-07-07 HISTORY — PX: CORONARY PRESSURE/FFR STUDY: CATH118243

## 2019-07-07 HISTORY — PX: LEFT HEART CATH AND CORONARY ANGIOGRAPHY: CATH118249

## 2019-07-07 LAB — GLUCOSE, CAPILLARY
Glucose-Capillary: 123 mg/dL — ABNORMAL HIGH (ref 70–99)
Glucose-Capillary: 140 mg/dL — ABNORMAL HIGH (ref 70–99)
Glucose-Capillary: 148 mg/dL — ABNORMAL HIGH (ref 70–99)

## 2019-07-07 LAB — TROPONIN I (HIGH SENSITIVITY)
Troponin I (High Sensitivity): 20 ng/L — ABNORMAL HIGH (ref <18)
Troponin I (High Sensitivity): 794 ng/L (ref <18)

## 2019-07-07 LAB — CBC WITH DIFFERENTIAL/PLATELET
Abs Immature Granulocytes: 0.02 10*3/uL (ref 0.00–0.07)
Basophils Absolute: 0.1 10*3/uL (ref 0.0–0.1)
Basophils Relative: 1 %
Eosinophils Absolute: 0.2 10*3/uL (ref 0.0–0.5)
Eosinophils Relative: 3 %
HCT: 46 % (ref 39.0–52.0)
Hemoglobin: 16.3 g/dL (ref 13.0–17.0)
Immature Granulocytes: 0 %
Lymphocytes Relative: 30 %
Lymphs Abs: 2.4 10*3/uL (ref 0.7–4.0)
MCH: 30.5 pg (ref 26.0–34.0)
MCHC: 35.4 g/dL (ref 30.0–36.0)
MCV: 86.1 fL (ref 80.0–100.0)
Monocytes Absolute: 0.6 10*3/uL (ref 0.1–1.0)
Monocytes Relative: 8 %
Neutro Abs: 4.6 10*3/uL (ref 1.7–7.7)
Neutrophils Relative %: 58 %
Platelets: 193 10*3/uL (ref 150–400)
RBC: 5.34 MIL/uL (ref 4.22–5.81)
RDW: 14.3 % (ref 11.5–15.5)
WBC: 7.9 10*3/uL (ref 4.0–10.5)
nRBC: 0 % (ref 0.0–0.2)

## 2019-07-07 LAB — ECHOCARDIOGRAM COMPLETE
Height: 72 in
Weight: 3152 oz

## 2019-07-07 LAB — COMPREHENSIVE METABOLIC PANEL
ALT: 41 U/L (ref 0–44)
AST: 47 U/L — ABNORMAL HIGH (ref 15–41)
Albumin: 4 g/dL (ref 3.5–5.0)
Alkaline Phosphatase: 55 U/L (ref 38–126)
Anion gap: 12 (ref 5–15)
BUN: 22 mg/dL (ref 8–23)
CO2: 24 mmol/L (ref 22–32)
Calcium: 9.1 mg/dL (ref 8.9–10.3)
Chloride: 103 mmol/L (ref 98–111)
Creatinine, Ser: 0.86 mg/dL (ref 0.61–1.24)
GFR calc Af Amer: >60 mL/min (ref >60)
GFR calc non Af Amer: >60 mL/min (ref >60)
Glucose, Bld: 194 mg/dL — ABNORMAL HIGH (ref 70–99)
Potassium: 4 mmol/L (ref 3.5–5.1)
Sodium: 139 mmol/L (ref 135–145)
Total Bilirubin: 2 mg/dL — ABNORMAL HIGH (ref 0.3–1.2)
Total Protein: 7.4 g/dL (ref 6.5–8.1)

## 2019-07-07 LAB — BRAIN NATRIURETIC PEPTIDE: B Natriuretic Peptide: 111.1 pg/mL — ABNORMAL HIGH (ref 0.0–100.0)

## 2019-07-07 LAB — PROTIME-INR
INR: 1.1 (ref 0.8–1.2)
Prothrombin Time: 13.8 seconds (ref 11.4–15.2)

## 2019-07-07 LAB — APTT: aPTT: 30 seconds (ref 24–36)

## 2019-07-07 LAB — HEMOGLOBIN A1C
Hgb A1c MFr Bld: 7 % — ABNORMAL HIGH (ref 4.8–5.6)
Mean Plasma Glucose: 154.2 mg/dL

## 2019-07-07 LAB — MAGNESIUM: Magnesium: 1.4 mg/dL — ABNORMAL LOW (ref 1.7–2.4)

## 2019-07-07 LAB — POCT ACTIVATED CLOTTING TIME: Activated Clotting Time: 307 seconds

## 2019-07-07 LAB — SARS CORONAVIRUS 2 BY RT PCR (HOSPITAL ORDER, PERFORMED IN ~~LOC~~ HOSPITAL LAB): SARS Coronavirus 2: NEGATIVE

## 2019-07-07 SURGERY — LEFT HEART CATH AND CORONARY ANGIOGRAPHY
Anesthesia: Moderate Sedation

## 2019-07-07 MED ORDER — ASPIRIN 81 MG PO CHEW: 81.0000 mg | CHEWABLE_TABLET | ORAL | Status: DC

## 2019-07-07 MED ORDER — HYDROCHLOROTHIAZIDE 25 MG PO TABS: 25.0000 mg | ORAL_TABLET | Freq: Every day | ORAL | Status: DC

## 2019-07-07 MED ORDER — ADULT MULTIVITAMIN W/MINERALS CH
1.0000 | ORAL_TABLET | Freq: Every day | ORAL | Status: DC
  Administered 2019-07-08: 1 via ORAL
  Filled 2019-07-07: qty 1

## 2019-07-07 MED ORDER — OMEGA-3-ACID ETHYL ESTERS 1 G PO CAPS: 1.0000 g | ORAL_CAPSULE | Freq: Every day | ORAL | Status: DC

## 2019-07-07 MED ORDER — LABETALOL HCL 5 MG/ML IV SOLN: 10.0000 mg | INTRAVENOUS | Status: DC | PRN

## 2019-07-07 MED ORDER — ONDANSETRON HCL 4 MG/2ML IJ SOLN: 4.0000 mg | Freq: Four times a day (QID) | INTRAMUSCULAR | Status: DC | PRN

## 2019-07-07 MED ORDER — SODIUM CHLORIDE 0.9% FLUSH: 3.0000 mL | Freq: Two times a day (BID) | INTRAVENOUS | Status: DC

## 2019-07-07 MED ORDER — ATORVASTATIN CALCIUM 20 MG PO TABS: 80.0000 mg | ORAL_TABLET | Freq: Every day | ORAL | Status: DC

## 2019-07-07 MED ORDER — ADULT MULTIVITAMIN W/MINERALS CH: 1.0000 | ORAL_TABLET | Freq: Every day | ORAL | Status: DC

## 2019-07-07 MED ORDER — PANTOPRAZOLE SODIUM 40 MG PO TBEC
40.0000 mg | DELAYED_RELEASE_TABLET | Freq: Every day | ORAL | Status: DC
  Administered 2019-07-08 – 2019-07-09 (×2): 40 mg via ORAL
  Filled 2019-07-07 (×2): qty 1

## 2019-07-07 MED ORDER — ACETAMINOPHEN 325 MG PO TABS: 650.0000 mg | ORAL_TABLET | ORAL | Status: DC | PRN

## 2019-07-07 MED ORDER — GLIPIZIDE ER 5 MG PO TB24
5.0000 mg | ORAL_TABLET | Freq: Two times a day (BID) | ORAL | Status: DC
  Filled 2019-07-07: qty 1

## 2019-07-07 MED ORDER — ATROPINE SULFATE 1 MG/10ML IJ SOSY: 0.5000 mg | PREFILLED_SYRINGE | Freq: Once | INTRAMUSCULAR | Status: DC

## 2019-07-07 MED ORDER — SODIUM CHLORIDE 0.9 % IV SOLN: 250.0000 mL | INTRAVENOUS | Status: DC | PRN

## 2019-07-07 MED ORDER — CLOPIDOGREL BISULFATE 75 MG PO TABS
75.0000 mg | ORAL_TABLET | Freq: Every day | ORAL | Status: DC
  Administered 2019-07-08 – 2019-07-09 (×2): 75 mg via ORAL
  Filled 2019-07-07 (×2): qty 1

## 2019-07-07 MED ORDER — TAMSULOSIN HCL 0.4 MG PO CAPS
0.4000 mg | ORAL_CAPSULE | Freq: Every day | ORAL | Status: DC
  Administered 2019-07-07 – 2019-07-08 (×2): 0.4 mg via ORAL
  Filled 2019-07-07 (×2): qty 1

## 2019-07-07 MED ORDER — FENTANYL CITRATE (PF) 100 MCG/2ML IJ SOLN
INTRAMUSCULAR | Status: AC
  Filled 2019-07-07: qty 2

## 2019-07-07 MED ORDER — PSYLLIUM 95 % PO PACK
1.0000 | PACK | Freq: Every day | ORAL | Status: DC
  Filled 2019-07-07: qty 1

## 2019-07-07 MED ORDER — ASPIRIN EC 81 MG PO TBEC
81.0000 mg | DELAYED_RELEASE_TABLET | Freq: Every day | ORAL | Status: DC
  Filled 2019-07-07: qty 1

## 2019-07-07 MED ORDER — SODIUM CHLORIDE 0.9% FLUSH: 3.0000 mL | INTRAVENOUS | Status: DC | PRN

## 2019-07-07 MED ORDER — EZETIMIBE 10 MG PO TABS: 10.0000 mg | ORAL_TABLET | Freq: Every day | ORAL | Status: DC

## 2019-07-07 MED ORDER — CLOPIDOGREL BISULFATE 300 MG PO TABS
600.0000 mg | ORAL_TABLET | Freq: Once | ORAL | Status: AC
  Administered 2019-07-07: 600 mg via ORAL
  Filled 2019-07-07: qty 2

## 2019-07-07 MED ORDER — INSULIN ASPART 100 UNIT/ML ~~LOC~~ SOLN
0.0000 [IU] | Freq: Three times a day (TID) | SUBCUTANEOUS | Status: DC
  Administered 2019-07-08 (×2): 2 [IU] via SUBCUTANEOUS
  Administered 2019-07-09: 3 [IU] via SUBCUTANEOUS

## 2019-07-07 MED ORDER — NITROGLYCERIN 0.4 MG SL SUBL: 0.4000 mg | SUBLINGUAL_TABLET | SUBLINGUAL | Status: DC | PRN

## 2019-07-07 MED ORDER — FLUTICASONE PROPIONATE 50 MCG/ACT NA SUSP
2.0000 | Freq: Every day | NASAL | Status: DC | PRN
  Filled 2019-07-07: qty 16

## 2019-07-07 MED ORDER — LIDOCAINE HCL (PF) 1 % IJ SOLN
INTRAMUSCULAR | Status: DC | PRN
  Administered 2019-07-07: 3 mL

## 2019-07-07 MED ORDER — MAGNESIUM SULFATE 2 GM/50ML IV SOLN
2.0000 g | Freq: Once | INTRAVENOUS | Status: AC
  Administered 2019-07-07: 2 g via INTRAVENOUS
  Filled 2019-07-07: qty 50

## 2019-07-07 MED ORDER — TRAMADOL HCL 50 MG PO TABS
50.0000 mg | ORAL_TABLET | Freq: Two times a day (BID) | ORAL | Status: DC
  Administered 2019-07-07 – 2019-07-09 (×4): 50 mg via ORAL
  Filled 2019-07-07 (×4): qty 1

## 2019-07-07 MED ORDER — LISINOPRIL 10 MG PO TABS
10.0000 mg | ORAL_TABLET | Freq: Every day | ORAL | Status: DC
  Administered 2019-07-07 – 2019-07-09 (×3): 10 mg via ORAL
  Filled 2019-07-07 (×3): qty 1

## 2019-07-07 MED ORDER — NITROGLYCERIN 1 MG/10 ML FOR IR/CATH LAB
INTRA_ARTERIAL | Status: DC | PRN
  Administered 2019-07-07: 200 ug via INTRA_ARTERIAL

## 2019-07-07 MED ORDER — HEPARIN (PORCINE) IN NACL 1000-0.9 UT/500ML-% IV SOLN
INTRAVENOUS | Status: AC
  Filled 2019-07-07: qty 1000

## 2019-07-07 MED ORDER — POLYVINYL ALCOHOL 1.4 % OP SOLN
Freq: Every day | OPHTHALMIC | Status: DC | PRN
  Filled 2019-07-07: qty 15

## 2019-07-07 MED ORDER — ACETAMINOPHEN 325 MG PO TABS: 650.0000 mg | ORAL_TABLET | Freq: Two times a day (BID) | ORAL | Status: DC

## 2019-07-07 MED ORDER — SODIUM CHLORIDE 0.9 % IV SOLN: INTRAVENOUS | Status: DC

## 2019-07-07 MED ORDER — TAMSULOSIN HCL 0.4 MG PO CAPS: 0.4000 mg | ORAL_CAPSULE | Freq: Every day | ORAL | Status: DC

## 2019-07-07 MED ORDER — CARVEDILOL 6.25 MG PO TABS: 3.1250 mg | ORAL_TABLET | Freq: Two times a day (BID) | ORAL | Status: DC

## 2019-07-07 MED ORDER — ROSUVASTATIN CALCIUM 20 MG PO TABS
40.0000 mg | ORAL_TABLET | Freq: Every day | ORAL | Status: DC
  Administered 2019-07-08: 40 mg via ORAL
  Filled 2019-07-07 (×2): qty 2

## 2019-07-07 MED ORDER — MULTIVITAMINS PO CAPS: 1.0000 | ORAL_CAPSULE | Freq: Every day | ORAL | Status: DC

## 2019-07-07 MED ORDER — EZETIMIBE 10 MG PO TABS
10.0000 mg | ORAL_TABLET | Freq: Every day | ORAL | Status: DC
  Administered 2019-07-07 – 2019-07-08 (×2): 10 mg via ORAL
  Filled 2019-07-07 (×2): qty 1

## 2019-07-07 MED ORDER — ZOLPIDEM TARTRATE 5 MG PO TABS
5.0000 mg | ORAL_TABLET | Freq: Every evening | ORAL | Status: DC | PRN
  Administered 2019-07-07 – 2019-07-08 (×2): 5 mg via ORAL
  Filled 2019-07-07 (×2): qty 1

## 2019-07-07 MED ORDER — ASPIRIN 300 MG RE SUPP: 300.0000 mg | RECTAL | Status: DC

## 2019-07-07 MED ORDER — SODIUM CHLORIDE 0.9 % WEIGHT BASED INFUSION
3.0000 mL/kg/h | INTRAVENOUS | Status: DC
  Administered 2019-07-08: 3 mL/kg/h via INTRAVENOUS

## 2019-07-07 MED ORDER — ATROPINE SULFATE 1 MG/10ML IJ SOSY
PREFILLED_SYRINGE | INTRAMUSCULAR | Status: AC
  Filled 2019-07-07: qty 10

## 2019-07-07 MED ORDER — SODIUM CHLORIDE 0.9 % WEIGHT BASED INFUSION
1.0000 mL/kg/h | INTRAVENOUS | Status: DC
  Administered 2019-07-08: 1 mL/kg/h via INTRAVENOUS

## 2019-07-07 MED ORDER — SALINE SPRAY 0.65 % NA SOLN
1.0000 | NASAL | Status: DC | PRN
  Filled 2019-07-07: qty 44

## 2019-07-07 MED ORDER — HEPARIN BOLUS VIA INFUSION
4000.0000 [IU] | Freq: Once | INTRAVENOUS | Status: AC
  Administered 2019-07-07: 4000 [IU] via INTRAVENOUS
  Filled 2019-07-07: qty 4000

## 2019-07-07 MED ORDER — NITROGLYCERIN 1 MG/10 ML FOR IR/CATH LAB
INTRA_ARTERIAL | Status: AC
  Filled 2019-07-07: qty 10

## 2019-07-07 MED ORDER — HEPARIN (PORCINE) 25000 UT/250ML-% IV SOLN
1150.0000 [IU]/h | INTRAVENOUS | Status: DC
  Administered 2019-07-07: 1150 [IU]/h via INTRAVENOUS
  Filled 2019-07-07: qty 250

## 2019-07-07 MED ORDER — HYDRALAZINE HCL 20 MG/ML IJ SOLN: 10.0000 mg | INTRAMUSCULAR | Status: DC | PRN

## 2019-07-07 MED ORDER — IPRATROPIUM BROMIDE 0.06 % NA SOLN: 2.0000 | Freq: Three times a day (TID) | NASAL | Status: DC

## 2019-07-07 MED ORDER — SODIUM CHLORIDE 0.9 % IV BOLUS
1000.0000 mL | Freq: Once | INTRAVENOUS | Status: AC
  Administered 2019-07-07: 1000 mL via INTRAVENOUS

## 2019-07-07 MED ORDER — MIDAZOLAM HCL 2 MG/2ML IJ SOLN
INTRAMUSCULAR | Status: DC | PRN
  Administered 2019-07-07: 1 mg via INTRAVENOUS

## 2019-07-07 MED ORDER — FENTANYL CITRATE (PF) 100 MCG/2ML IJ SOLN
INTRAMUSCULAR | Status: DC | PRN
  Administered 2019-07-07: 25 ug via INTRAVENOUS

## 2019-07-07 MED ORDER — LIDOCAINE HCL (PF) 1 % IJ SOLN
INTRAMUSCULAR | Status: AC
  Filled 2019-07-07: qty 30

## 2019-07-07 MED ORDER — IOHEXOL 300 MG/ML  SOLN
INTRAMUSCULAR | Status: DC | PRN
  Administered 2019-07-07: 120 mL

## 2019-07-07 MED ORDER — MIDAZOLAM HCL 2 MG/2ML IJ SOLN
INTRAMUSCULAR | Status: AC
  Filled 2019-07-07: qty 2

## 2019-07-07 MED ORDER — ASPIRIN 81 MG PO CHEW
81.0000 mg | CHEWABLE_TABLET | ORAL | Status: AC
  Administered 2019-07-08: 81 mg via ORAL
  Filled 2019-07-07: qty 1

## 2019-07-07 MED ORDER — ASPIRIN EC 81 MG PO TBEC: 81.0000 mg | DELAYED_RELEASE_TABLET | Freq: Every day | ORAL | Status: DC

## 2019-07-07 MED ORDER — SODIUM CHLORIDE 0.9 % IV SOLN: INTRAVENOUS | Status: AC

## 2019-07-07 MED ORDER — HEPARIN SODIUM (PORCINE) 1000 UNIT/ML IJ SOLN
INTRAMUSCULAR | Status: AC
  Filled 2019-07-07: qty 1

## 2019-07-07 MED ORDER — HEPARIN SODIUM (PORCINE) 1000 UNIT/ML IJ SOLN
INTRAMUSCULAR | Status: DC | PRN
  Administered 2019-07-07 (×2): 4500 [IU] via INTRAVENOUS

## 2019-07-07 MED ORDER — SODIUM CHLORIDE 0.9% FLUSH
3.0000 mL | Freq: Two times a day (BID) | INTRAVENOUS | Status: DC
  Administered 2019-07-09: 3 mL via INTRAVENOUS

## 2019-07-07 MED ORDER — ASPIRIN 81 MG PO CHEW: 324.0000 mg | CHEWABLE_TABLET | ORAL | Status: DC

## 2019-07-07 MED ORDER — LISINOPRIL 10 MG PO TABS: 10.0000 mg | ORAL_TABLET | Freq: Every day | ORAL | Status: DC

## 2019-07-07 MED ORDER — VERAPAMIL HCL 2.5 MG/ML IV SOLN
INTRAVENOUS | Status: AC
  Filled 2019-07-07: qty 2

## 2019-07-07 MED ORDER — HEPARIN (PORCINE) IN NACL 1000-0.9 UT/500ML-% IV SOLN
INTRAVENOUS | Status: DC | PRN
  Administered 2019-07-07: 500 mL

## 2019-07-07 SURGICAL SUPPLY — 13 items
CATH 5F 110X4 TIG (CATHETERS) ×2 IMPLANT
CATH INFINITI 5 FR AL2 (CATHETERS) ×2 IMPLANT
CATH INFINITI 5 FR MPA2 (CATHETERS) ×2 IMPLANT
CATH INFINITI JR4 5F (CATHETERS) ×2 IMPLANT
CATH LAUNCHER 6FR EBU3.5 (CATHETERS) ×2 IMPLANT
DEVICE INFLAT 30 PLUS (MISCELLANEOUS) ×2 IMPLANT
DEVICE RAD TR BAND REGULAR (VASCULAR PRODUCTS) ×2 IMPLANT
GLIDESHEATH SLEND SS 6F .021 (SHEATH) ×2 IMPLANT
GUIDEWIRE INQWIRE 1.5J.035X260 (WIRE) ×1 IMPLANT
GUIDEWIRE PRESS OMNI 185 JTIP (WIRE) ×2 IMPLANT
INQWIRE 1.5J .035X260CM (WIRE) ×2
KIT MANI 3VAL PERCEP (MISCELLANEOUS) ×2 IMPLANT
PACK CARDIAC CATH (CUSTOM PROCEDURE TRAY) ×2 IMPLANT

## 2019-07-07 NOTE — H&P (Signed)
History and Physical    [name] [name] WUJ:[id number] DOB: 1940-04-27 DOA: 07/07/2019  PCP: [name], [name]   Patient coming from: Home  I have personally briefly reviewed patient's old medical records in [place]  Chief Complaint: Chest pressure  HPI: [name] [name] is a 79 y.o. male with medical history significant for coronary artery disease status post stent angioplasty, hypertension, diabetes mellitus and ischemic cardiomyopathy who was going to the emergency room by his wife for evaluation of chest pressure which started acutely and woke him up out of his sleep at about 4 AM.  Chest pressure was associated with diaphoresis and nausea.  While in triage patient was noted to be hypotensive with systolic blood pressure in the 60s, he was diaphoretic and pale and tachycardic with heart rate in the 170s.  He had a twelve-lead EKG done at that time which showed SVT.  Because patient was hypotensive the plan was to do a synchronized cardioversion, however before that could be done patient became bradycardic with heart rates in the 40s. He denied having any emesis, no abdominal pain, no fever, no chills, no cough, no urinary symptoms. His initial twelve-lead EKG showed supraventricular tachycardia with heart rate in the 170s and subsequent EKG's showed sinus bradycardia with a left bundle branch block. Patient bumped his troponin from 20 to 794 and was taken emergently to the Cath Lab where he was found to have severe two-vessel coronary artery disease, with aneurysmal dilation of the proximal and mid LAD and heavy calcification. There is a focal 80% proximal/mid LAD stenosis at the takeoff of D1, which is hemodynamically significant (iFR = 0.84).  The ostium of D1 also contains a 78-80% stenosis.  The proximal RCA is chronically occluded, with the distal branches filling via left-to-right collaterals (attempted PCI of RCA CTO was unsuccessful in ~2005). Patent stent in ramus  intermedius.   ED Course: Patient is a 79 year old Caucasian male with a history of coronary artery disease status post stent angioplasty who presented for evaluation of chest pressure that woke him up out of his sleep associated with nausea and diaphoresis.  He initially had unstable SVT upon presentation to the ER and then converted to sinus bradycardia.  He had a bump in his troponin from 20 to > 700 and was taken emergently to  cardiac cath.  Review of Systems: As per HPI otherwise 10 point review of systems negative.    Past Medical History:  Diagnosis Date  . Aortic insufficiency    a. noted on TTE 2015  . Arthritis   . CAD (coronary artery disease)    a. remote PCI in 1991 and 2005; b. MV 3/15: old inferior MI, no ischemia, LVEF 50%, slight inferior wall hypokniesis  . Chicken pox   . Colon polyps    4 pre-cancerous   . Diverticulitis   . DM type 2 (diabetes mellitus, type 2) ([place])   . Family history of adverse reaction to anesthesia   . GERD (gastroesophageal reflux disease)   . Heart murmur   . History of kidney stones   . Hyperlipidemia   . Hypertension   . Kidney stones   . Melanoma ([place]) 1980   Resected from his back  . Mitral regurgitation    a. noted on TTE 2015  . Myocardial infarction ([place])   . Systolic dysfunction    a. TTE 2015: EF  50-55%, mild global hypokinesis, mild to moderate aortic sclerosis, mild aortic insufficiency, mild to moderate mitral regurgitation,  mild tricuspid regurgitation, moderately dilated left atrium, mildly dilated right ventricle, PASP 31 mmHg    Past Surgical History:  Procedure Laterality Date  . CHOLECYSTECTOMY  2012  . COLONOSCOPY     in 2003 with polyp removed and leak anastomosis had to have open abdominal surgery   . CORONARY ANGIOPLASTY WITH STENT PLACEMENT  1991 & 2005  . CYSTOSCOPY/URETEROSCOPY/HOLMIUM LASER/STENT PLACEMENT Right 01/02/2018   Procedure: CYSTOSCOPY/URETEROSCOPY/HOLMIUM LASER/STENT PLACEMENT;  Surgeon:  [name], MD;  Location: [facility] ORS;  Service: Urology;  Laterality: Right;  . ESOPHAGOGASTRODUODENOSCOPY (EGD) WITH PROPOFOL N/A 11/19/2016   Procedure: ESOPHAGOGASTRODUODENOSCOPY (EGD) WITH PROPOFOL;  Surgeon: [name], MD;  Location: [facility] END[facility]COPY;  Service: Endoscopy;  Laterality: N/A;  . ESOPHAGOGASTRODUODENOSCOPY (EGD) WITH PROPOFOL N/A 02/02/2019   Procedure: ESOPHAGOGASTRODUODENOSCOPY (EGD) WITH PROPOFOL;  Surgeon: [name], MD;  Location: [facility] ENDOSCOPY;  Service: Endoscopy;  Laterality: N/A;  . LITHOTRIPSY  2015  . MELANOMA EXCISION  1980   malignant  . NERVE SURGERY  2015   ulna nerve  . TONSILLECTOMY  1945  . WISDOM TOOTH EXTRACTION       reports that he has been smoking pipe. He quit smokeless tobacco use about 3 years ago. He reports current alcohol use. He reports that he does not use drugs.  Allergies  Allergen Reactions  . Metformin And Related Diarrhea    Diarrhea, leg cramps  . Azithromycin Other (See Comments)    Not recommended - no reaction    Family History  Problem Relation Age of Onset  . Hyperlipidemia Mother   . Hypertension Mother   . Heart disease Mother   . Diabetes Mother   . Heart attack Mother   . Colon cancer Father   . Lung cancer Father   . Kidney cancer Father        malignant capsulated kidney tumor  . Diabetes Father   . Liver cancer Father   . Bladder Cancer Neg Hx   . Prostate cancer Neg Hx      Prior to Admission medications   Medication Sig Start Date End Date Taking? Authorizing Provider  carvedilol (COREG) 3.125 MG tablet TAKE 1 TABLET(3.125 MG) BY MOUTH TWICE DAILY 05/28/19  Yes [name], [name], MD  ezetimibe (ZETIA) 10 MG tablet TAKE 1 TABLET(10 MG) BY MOUTH DAILY 05/13/19  Yes [name], [name], MD  glipiZIDE (GLUCOTROL XL) 5 MG 24 hr tablet Take 1 tablet (5 mg total) by mouth 2 (two) times daily. With food 10/01/18  Yes McLean-Scocuzza, Nino Glow, MD  hydrochlorothiazide (HYDRODIURIL) 25 MG tablet TAKE 1  TABLET(25 MG) BY MOUTH DAILY 05/14/19  Yes [name], [name]  lisinopril (ZESTRIL) 10 MG tablet TAKE 1 TABLET BY MOUTH DAILY 02/26/19  Yes [name], [name]  rosuvastatin (CRESTOR) 10 MG tablet TAKE 1 TABLET(10 MG) BY MOUTH DAILY 05/14/19  Yes [name], [name], MD  Semaglutide,0.25 or 0.5MG /DOS, (OZEMPIC, 0.25 OR 0.5 MG/DOSE,) 2 MG/1.5ML SOPN Inject 1 mg into the skin once a week. 04/05/19  Yes [name], [name]  tamsulosin (FLOMAX) 0.4 MG CAPS capsule TAKE 1 CAPSULE(0.4 MG) BY MOUTH DAILY 10/27/18  Yes [name], MD  traMADol (ULTRAM) 50 MG tablet TAKE 1 TABLET BY MOUTH EVERY 12 HOURS AS NEEDED 05/14/19  Yes [name], [name]  acetaminophen (TYLENOL) 650 MG CR tablet Take 650 mg by mouth 2 (two) times a day.    [provider]  aspirin EC 81 MG tablet Take 81 mg by mouth daily.  [provider]  atorvastatin (LIPITOR) 80 MG tablet atorvastatin 80 mg tablet Patient not taking: atorvastatin 80 mg tablet    [provider]  doxycycline (VIBRA-TABS) 100 MG tablet doxycycline hyclate 100 mg tablet Patient not taking: doxycycline hyclate 100 mg tablet    [provider]  fluconazole (DIFLUCAN) 150 MG tablet fluconazole 150 mg tablet Patient not taking: fluconazole 150 mg tablet    [provider]  fluticasone (FLONASE) 50 MCG/ACT nasal spray Place 2 sprays into both nostrils daily. Patient taking differently: Place 2 sprays into both nostrils daily as needed for allergies.  10/03/15   Thersa Salt G, DO  ipratropium (ATROVENT) 0.06 % nasal spray Place 2 sprays into both nostrils 3 (three) times daily. 10/23/18   McLean-Scocuzza, Nino Glow, MD  Multiple Vitamin (MULTIVITAMIN) capsule Take 1 capsule by mouth daily with lunch.     [provider]  naproxen sodium (ALEVE) 220 MG tablet Take 220 mg by mouth 2 (two) times daily as needed (for pain or headache).    [provider]  Omega-3 Fatty Acids (FISH OIL PO) Take 1  capsule by mouth daily.     [provider]  pantoprazole (PROTONIX) 40 MG tablet TAKE 1 TABLET BY MOUTH TWICE DAILY 02/08/19   [name], MD  Polyvinyl Alcohol-Povidone (MURINE TEARS FOR DRY EYES OP) Place 1-2 drops into both eyes daily as needed (for dry eyes).    [provider]  psyllium (METAMUCIL) 58.6 % powder Take 1 packet by mouth at bedtime.     [provider]    Physical Exam: Vitals:   07/07/19 1013 07/07/19 1048 07/07/19 1200 07/07/19 1215  BP: (!) 128/94  133/78 (!) 129/92  Pulse: 66  (!) 51 60  Resp: 18  15 13   Temp: 97.7 F (36.5 C)     TempSrc: Oral     SpO2: 98% 98% 98% 96%  Weight: 89.4 kg     Height: 6' (1.829 m)        Vitals:   07/07/19 1013 07/07/19 1048 07/07/19 1200 07/07/19 1215  BP: (!) 128/94  133/78 (!) 129/92  Pulse: 66  (!) 51 60  Resp: 18  15 13   Temp: 97.7 F (36.5 C)     TempSrc: Oral     SpO2: 98% 98% 98% 96%  Weight: 89.4 kg     Height: 6' (1.829 m)       Constitutional: NAD, alert and oriented x 3 Eyes: PERRL, lids and conjunctivae normal ENMT: Mucous membranes are moist.  Neck: normal, supple, no masses, no thyromegaly Respiratory: clear to auscultation bilaterally, no wheezing, no crackles. Normal respiratory effort. No accessory muscle use.  Cardiovascular: Regular rate and rhythm, no murmurs / rubs / gallops. No extremity edema. 2+ pedal pulses. No carotid bruits.  Abdomen: no tenderness, no masses palpated. No hepatosplenomegaly. Bowel sounds positive.  Musculoskeletal: no clubbing / cyanosis. No joint deformity upper and lower extremities.  Skin: no rashes, lesions, ulcers.  Neurologic: No gross focal neurologic deficit. Psychiatric: Normal mood and affect.   Labs on Admission: I have personally reviewed following labs and imaging studies  CBC: Recent Labs  Lab 07/07/19 0543  WBC 7.9  NEUTROABS 4.6  HGB 16.3  HCT 46.0  MCV 86.1  PLT 601   Basic Metabolic Panel: Recent Labs  Lab  07/07/19 0543  NA 139  K 4.0  CL 103  CO2 24  GLUCOSE 194*  BUN 22  CREATININE 0.86  CALCIUM 9.1  MG 1.4*   GFR: Estimated Creatinine Clearance: 76.4 mL/min (by C-G formula based on SCr of 0.86 mg/dL). Liver Function Tests: Recent Labs  Lab 07/07/19 0543  AST 47*  ALT 41  ALKPHOS 55  BILITOT 2.0*  PROT 7.4  ALBUMIN 4.0   No results for input(s): LIPASE, AMYLASE in the last 168 hours. No results for input(s): AMMONIA in the last 168 hours. Coagulation Profile: Recent Labs  Lab 07/07/19 0639  INR 1.1   Cardiac Enzymes: No results for input(s): CKTOTAL, CKMB, CKMBINDEX, TROPONINI in the last 168 hours. BNP (last 3 results) No results for input(s): PROBNP in the last 8760 hours. HbA1C: No results for input(s): HGBA1C in the last 72 hours. CBG: Recent Labs  Lab 07/07/19 1020  GLUCAP 123*   Lipid Profile: No results for input(s): CHOL, HDL, LDLCALC, TRIG, CHOLHDL, LDLDIRECT in the last 72 hours. Thyroid Function Tests: No results for input(s): TSH, T4TOTAL, FREET4, T3FREE, THYROIDAB in the last 72 hours. Anemia Panel: No results for input(s): VITAMINB12, FOLATE, FERRITIN, TIBC, IRON, RETICCTPCT in the last 72 hours. Urine analysis:    Component Value Date/Time   COLORURINE YELLOW (A) 12/22/2017 1810   APPEARANCEUR Clear 10/29/2018 1148   LABSPEC 1.013 12/22/2017 1810   PHURINE 6.0 12/22/2017 1810   GLUCOSEU Negative 10/29/2018 1148   GLUCOSEU NEGATIVE 01/08/2017 1005   HGBUR MODERATE (A) 12/22/2017 1810   BILIRUBINUR Negative 10/29/2018 1148   KETONESUR NEGATIVE 12/22/2017 1810   PROTEINUR 1+ (A) 10/29/2018 1148   PROTEINUR NEGATIVE 12/22/2017 1810   UROBILINOGEN 0.2 10/22/2017 1037   UROBILINOGEN 0.2 01/08/2017 1005   NITRITE Negative 10/29/2018 1148   NITRITE NEGATIVE 12/22/2017 1810   LEUKOCYTESUR Trace (A) 10/29/2018 1148    Radiological Exams on Admission: No results found.  EKG: Independently reviewed.  Sinus bradycardia with a left bundle  branch block  Assessment/Plan Principal Problem:   NSTEMI (non-ST elevated myocardial infarction) ([place]) Active Problems:   CAD (coronary artery disease)   Essential hypertension   Mixed hyperlipidemia   Controlled type 2 diabetes mellitus with complication, without long-term current use of insulin ([place])     NSTEMI  In a patient with known coronary artery disease who presented to the ER for evaluation of chest pressure, came up out of his sleep associated with nausea and diaphoresis Patient bumped his troponin from 20 to 794 had a cardiac cath which showed severe two-vessel coronary artery disease Patient was started on heparin drip in the ER Continue dual antiplatelet therapy, statins and beta-blockers Patient to be transferred [facility] for atherectomy/PCI of LAD   Diabetes mellitus\ Maintain consistent carbohydrate diet Hold oral hypoglycemic agents Sliding scale coverage with insulin    Hypertension Blood pressure is stable Continue carvedilol, hydrochlorothiazide and lisinopril     DVT prophylaxis: Heparin Code Status: Full code Family Communication: Greater than 50% of time was spent discussing patient's condition and plan of care with him and his wife at the bedside.  All questions and concerns have been addressed. Disposition Plan: Back to previous home environment Consults called: Cardiology    Collier Bullock MD [facility]     07/07/2019, 12:33 PM

## 2019-07-07 NOTE — H&P (Signed)
History & Physical    Patient ID: [name] [name] MRN: [id number], DOB/AGE: [birth year]   Admit date: 07/07/2019   Primary Physician: [name], [name] Primary Cardiologist: Ida Rogue, MD  Patient Profile    [name] [name] is a 79 y.o. male with a history of CAD status post prior percutaneous interventions, hypertension, hyperlipidemia, type 2 diabetes mellitus, and ischemic cardiomyopathy, who is being seen today for the evaluation of non-STEMI.  Past Medical History    Past Medical History:  Diagnosis Date  . Aortic insufficiency    a. noted on TTE 2015  . Arthritis   . CAD (coronary artery disease)    a. remote PCI in 1991 and 2005; b. MV 3/15: old inferior MI, no ischemia, LVEF 50%, slight inferior wall hypokniesis  . Chicken pox   . Colon polyps    4 pre-cancerous   . Diverticulitis   . DM type 2 (diabetes mellitus, type 2) ([place])   . Family history of adverse reaction to anesthesia   . GERD (gastroesophageal reflux disease)   . Heart murmur   . History of kidney stones   . HOH (hard of hearing)   . Hyperlipidemia   . Hypertension   . Kidney stones   . Melanoma ([place]) 1980   Resected from his back  . Mitral regurgitation    a. noted on TTE 2015  . Myocardial infarction ([place])   . Systolic dysfunction    a. TTE 2015: EF  50-55%, mild global hypokinesis, mild to moderate aortic sclerosis, mild aortic insufficiency, mild to moderate mitral regurgitation, mild tricuspid regurgitation, moderately dilated left atrium, mildly dilated right ventricle, PASP 31 mmHg    Past Surgical History:  Procedure Laterality Date  . CHOLECYSTECTOMY  2012  . COLONOSCOPY     in 2003 with polyp removed and leak anastomosis had to have open abdominal surgery   . CORONARY ANGIOPLASTY WITH STENT PLACEMENT  1991 & 2005  . CYSTOSCOPY/URETEROSCOPY/HOLMIUM LASER/STENT PLACEMENT Right 01/02/2018   Procedure: CYSTOSCOPY/URETEROSCOPY/HOLMIUM LASER/STENT PLACEMENT;  Surgeon:  [name], MD;  Location: [facility] ORS;  Service: Urology;  Laterality: Right;  . ESOPHAGOGASTRODUODENOSCOPY (EGD) WITH PROPOFOL N/A 11/19/2016   Procedure: ESOPHAGOGASTRODUODENOSCOPY (EGD) WITH PROPOFOL;  Surgeon: [name], MD;  Location: [facility] ENDOSCOPY;  Service: Endoscopy;  Laterality: N/A;  . ESOPHAGOGASTRODUODENOSCOPY (EGD) WITH PROPOFOL N/A 02/02/2019   Procedure: ESOPHAGOGASTRODUODENOSCOPY (EGD) WITH PROPOFOL;  Surgeon: [name], MD;  Location: [facility] ENDOSCOPY;  Service: Endoscopy;  Laterality: N/A;  . LITHOTRIPSY  2015  . MELANOMA EXCISION  1980   malignant  . NERVE SURGERY  2015   ulna nerve  . TONSILLECTOMY  1945  . WISDOM TOOTH EXTRACTION       Allergies  Allergies  Allergen Reactions  . Metformin And Related Diarrhea and Other (See Comments)    Leg cramps, also  . Azithromycin Other (See Comments)    Not recommended - no reaction  . Other Other (See Comments)    If taking antibiotics for awhile, thrush results  . Percocet [Oxycodone-Acetaminophen] Other (See Comments)    Hallucinations    History of Present Illness    79 year old male with above complex past medical history including coronary disease status post prior interventions with PTCA 1991 and subsequent drug-eluting stent placement to an unknown vessel in 2005 (presumably right coronary).  Other history includes hypertension, hyperlipidemia, type 2 diabetes mellitus, melanoma, and ischemic cardiomyopathy with an EF of 50-55% by echocardiogram March 2019.  Most recent ischemic testing took place in March  2015 and was nonischemic but did show an old inferior MI.  [name] [name] was last seen in cardiology clinic in May, at which time he was doing well.  He notes that he and his wife walk several days per week and he has not been experiencing any exertional symptoms or limitations.  He has chronic sinus issues with congestion and occasional headaches.  He uses a Nettie pot as needed.  Over the past 2 weeks, he  has periodically been waking at night feeling diaphoretic but without other symptoms.  This morning, he awoke at approximately 4 AM feeling diaphoretic and somewhat weak.  He used the bathroom and upon returning to bed, he says he did not feel right.  He was not having chest pain but did note mild discomfort across his maxilla.  He checked his blood pressure and noted a systolic in the 25K.  He was not paying much attention to what his heart rate was but he is to be sure that it was in the 50s or 60s.  He checks his blood pressure a few more times both on the left and right arms, and each time, he was receiving readings in the 80s.  At this, he became concerned and alert his wife, who brought to the emergency department.  Upon arrival to the ED, he began to experience chest discomfort in addition to ongoing maxillary pain.  Initial triage ECG showed tachycardia at a rate of 172 bpm, felt to represent SVT with aberrancy.  The ED team had plan to give adenosine however, after placing an IV, he became bradycardic with rates in the 40s-question sinus bradycardia versus junctional rhythm per ER report.  Initial blood pressure was 68/49 and he was given atropine with improvement in heart rate and rise in blood pressures into the 1 teens.  Initial troponin was 20 however follow-up returned at 794.  He underwent diagnostic cath @ [facility] revealing a CTO of the RCA and severe LAD dzs.  Decision was made to tx to [facility] for atherectomy and PCI of the LAD.  Home Medications    Prior to Admission medications   Medication Sig Start Date End Date Taking? Authorizing Provider  acetaminophen (TYLENOL) 650 MG CR tablet Take 650 mg by mouth 2 (two) times daily as needed (for pain).    Yes [provider]  aspirin 325 MG EC tablet Take 650 mg by mouth once as needed ("for cardiac issues").   Yes [provider]  aspirin EC 81 MG tablet Take 81 mg by mouth daily.    Yes [provider]  carvedilol (COREG)  3.125 MG tablet Take 3.125 mg by mouth 2 (two) times daily with a meal.   Yes [provider]  ezetimibe (ZETIA) 10 MG tablet TAKE 1 TABLET(10 MG) BY MOUTH DAILY Patient taking differently: Take 10 mg by mouth at bedtime.  05/13/19  Yes [name], [name], MD  fluticasone (FLONASE) 50 MCG/ACT nasal spray Place 2 sprays into both nostrils daily. Patient taking differently: Place 2 sprays into both nostrils daily as needed (for seasonal allergies).  10/03/15  Yes Cook, Jayce G, DO  glipiZIDE (GLUCOTROL XL) 5 MG 24 hr tablet Take 1 tablet (5 mg total) by mouth 2 (two) times daily. With food Patient taking differently: Take 5 mg by mouth 2 (two) times daily with a meal.  10/01/18  Yes McLean-Scocuzza, Nino Glow, MD  hydrochlorothiazide (HYDRODIURIL) 25 MG tablet Take 25 mg by mouth daily with breakfast.   Yes  [provider]  hydrocortisone cream 1 % Apply 1 application topically See admin instructions. Apply to affected area/rash every 3 days   Yes [provider]  lisinopril (ZESTRIL) 10 MG tablet TAKE 1 TABLET BY MOUTH DAILY Patient taking differently: Take 10 mg by mouth.  02/26/19  Yes [name], [name]  Multiple Vitamin (MULTIVITAMIN) capsule Take 1 capsule by mouth daily with lunch.    Yes [provider]  naproxen sodium (ALEVE) 220 MG tablet Take 220 mg by mouth 2 (two) times daily as needed (for pain).   Yes [provider]  Omega-3 Fatty Acids (FISH OIL PO) Take 1 capsule by mouth daily at 12 noon.    Yes [provider]  pantoprazole (PROTONIX) 40 MG tablet TAKE 1 TABLET BY MOUTH TWICE DAILY Patient taking differently: Take 40 mg by mouth daily before breakfast.  02/08/19  Yes [name], MD  Polyvinyl Alcohol-Povidone (MURINE TEARS FOR DRY EYES OP) Place 1-2 drops into both eyes as needed (for dry eyes).    Yes [provider]  psyllium (METAMUCIL) 58.6 % powder Take 1 packet by mouth at bedtime as needed (if no roughage is  consumed that day- Mix and drink).    Yes [provider]  rosuvastatin (CRESTOR) 10 MG tablet Take 10 mg by mouth at bedtime.   Yes [provider]  Semaglutide,0.25 or 0.5MG /DOS, (OZEMPIC, 0.25 OR 0.5 MG/DOSE,) 2 MG/1.5ML SOPN Inject 1 mg into the skin once a week. Patient taking differently: Inject 1 mg into the skin every Sunday.  04/05/19  Yes [name], [name], [name]  sodium chloride (OCEAN) 0.65 % SOLN nasal spray Place 1 spray into both nostrils as needed for congestion.   Yes [provider]  tamsulosin (FLOMAX) 0.4 MG CAPS capsule TAKE 1 CAPSULE(0.4 MG) BY MOUTH DAILY Patient taking differently: Take 0.4 mg by mouth daily with lunch.  10/27/18  Yes [name], MD  traMADol (ULTRAM) 50 MG tablet TAKE 1 TABLET BY MOUTH EVERY 12 HOURS AS NEEDED Patient taking differently: Take 50 mg by mouth every 12 (twelve) hours.  05/14/19  Yes [name], [name], [name]  ipratropium (ATROVENT) 0.06 % nasal spray Place 2 sprays into both nostrils 3 (three) times daily. Patient not taking: Reported on 07/07/2019 10/23/18   McLean-Scocuzza, Nino Glow, MD    Family History    Family History  Problem Relation Age of Onset  . Hyperlipidemia Mother   . Hypertension Mother   . Heart disease Mother   . Diabetes Mother   . Heart attack Mother   . Colon cancer Father   . Lung cancer Father   . Kidney cancer Father        malignant capsulated kidney tumor  . Diabetes Father   . Liver cancer Father   . Bladder Cancer Neg Hx   . Prostate cancer Neg Hx    He indicated that his mother is deceased. He indicated that his father is deceased. He indicated that the status of his neg hx is unknown.   Social History    Social History   Socioeconomic History  . Marital status: Married    Spouse name: Not on file  . Number of children: Not on file  . Years of education: Not on file  . Highest education level: Not on file  Occupational History  . Not on file  Tobacco Use  .  Smoking status: Current Some Day Smoker    Types: Pipe  . Smokeless  tobacco: Former Systems developer    Quit date: 10/17/2015  Vaping Use  . Vaping Use: Never used  Substance and Sexual Activity  . Alcohol use: Yes    Alcohol/week: 0.0 - 1.0 standard drinks    Comment: none last 24hrs  . Drug use: No  . Sexual activity: Not on file  Other Topics Concern  . Not on file  Social History Narrative   Married    Social Determinants of Health   Financial Resource Strain: Medium Risk  . Difficulty of Paying Living Expenses: Somewhat hard  Food Insecurity:   . Worried About Charity fundraiser in the Last Year:   . Arboriculturist in the Last Year:   Transportation Needs:   . Film/video editor (Medical):   [name] Lack of Transportation (Non-Medical):   Physical Activity: Insufficiently Active  . Days of Exercise per Week: 3 days  . Minutes of Exercise per Session: 30 min  Stress:   . Feeling of Stress :   Social Connections:   . Frequency of Communication with Friends and Family:   . Frequency of Social Gatherings with Friends and Family:   . Attends Religious Services:   . Active Member of Clubs or Organizations:   . Attends Archivist Meetings:   [name] Marital Status:   Intimate Partner Violence:   . Fear of Current or Ex-Partner:   . Emotionally Abused:   [name] Physically Abused:   . Sexually Abused:      Review of Systems    General:  +++ night sweats.  +++ wkns.  No chills, fever, or weight changes.  Cardiovascular:  +++ chest pain, no dyspnea on exertion, edema, orthopnea, palpitations, paroxysmal nocturnal dyspnea. Dermatological: No rash, lesions/masses Respiratory: No cough, dyspnea Urologic: No hematuria, dysuria Abdominal:   No nausea, vomiting, diarrhea, bright red blood per rectum, melena, or hematemesis Neurologic:  No visual changes, wkns, changes in mental status. All other systems reviewed and are otherwise negative except as noted above.  Physical Exam      Blood pressure 139/81, pulse 60, temperature 97.8 F (36.6 C), temperature source Oral, resp. rate 19, height 6\' 1"  (1.854 m), weight 93.4 kg, SpO2 97 %.  General: Pleasant, NAD Psych: Normal affect. Neuro: Alert and oriented X 3. Moves all extremities spontaneously. HEENT: Normal  Neck: Supple without bruits or JVD. Lungs:  Resp regular and unlabored, CTA. Heart: RRR no s3, s4, or murmurs. Abdomen: Soft, non-tender, non-distended, BS + x 4.  Extremities: No clubbing, cyanosis or edema. DP/PT/Radials 2+ and equal bilaterally.  Labs    Cardiac Enzymes Recent Labs  Lab 07/07/19 0543 07/07/19 0745  TROPONINIHS 20* 794*      Lab Results  Component Value Date   WBC 7.9 07/07/2019   HGB 16.3 07/07/2019   HCT 46.0 07/07/2019   MCV 86.1 07/07/2019   PLT 193 07/07/2019    Recent Labs  Lab 07/07/19 0543  NA 139  K 4.0  CL 103  CO2 24  BUN 22  CREATININE 0.86  CALCIUM 9.1  PROT 7.4  BILITOT 2.0*  ALKPHOS 55  ALT 41  AST 47*  GLUCOSE 194*   Lab Results  Component Value Date   CHOL 120 02/10/2019   HDL 39.00 (L) 02/10/2019   LDLCALC 70 03/02/2018   TRIG 215.0 (H) 02/10/2019     Radiology Studies    CARDIAC CATHETERIZATION  Result Date: 07/07/2019 Conclusions: 1. Severe two-vessel coronary artery disease, with aneurysmal dilation of the  proximal and mid LAD and heavy calcification.  There is a focal 80% proximal/mid LAD stenosis at the takeoff of D1, which is hemodynamically significant (iFR = 0.84).  The ostium of D1 also contains a 78-80% stenosis.  The proximal RCA is chronically occluded, with the distal branches filling via left-to-right collaterals (attempted PCI of RCA CTO was unsuccessful in ~2005). 2. Patent stent in ramus intermedius. 3. Grossly normal left ventricular contraction with mildly elevated filling pressure. Recommendations: 1. Case reviewed with Dr. Irish Lack.  We will arrange for transfer to [facility] for atherectomy/PCI of LAD. 2. Obtain  transthoracic echocardiogram. 3. Aggressive secondary prevention.  We will load Mr. Brammer with clopidogrel, with continuation of dual antiplatelet therapy for at least 12 months. Nelva Bush, MD [facility]   ECHOCARDIOGRAM COMPLETE  Result Date: 07/07/2019    ECHOCARDIOGRAM REPORT   Patient Name:   JASAIAH KARWOWSKI Date of Exam: 07/07/2019 Medical Rec #:  [id number]         Height:       72.0 in Accession #:    [id number]        Weight:       197.0 lb Date of Birth:  September 06, 1940          BSA:          2.117 m Patient Age:    69 years          BP:           129/92 mmHg Patient Gender: M                 HR:           60 bpm. Exam Location:  [facility] Procedure: 2D Echo, Cardiac Doppler and Color Doppler Indications:     Chest pain 786.50  History:         Patient has prior history of Echocardiogram examinations, most                  recent 03/25/2017. CAD and Previous Myocardial Infarction; Risk                  Factors:Diabetes and Hypertension. Aortic insufficiency.  Sonographer:     Sherrie Sport RDCS (AE) Referring Phys:  [place] Diagnosing Phys: Harrell Gave End MD  Sonographer Comments: Technically challenging study due to limited acoustic windows, no parasternal window and no subcostal window. Pt kept supine in cath recovery. IMPRESSIONS  1. Left ventricular ejection fraction, by estimation, is 45 to 50%. The left ventricle has mildly decreased function. The left ventricle demonstrates regional wall motion abnormalities (see scoring diagram/findings for description). Left ventricular diastolic parameters are consistent with Grade I diastolic dysfunction (impaired relaxation). There is akinesis of the left ventricular, basal inferior wall.  2. Right ventricular systolic function was not well visualized. The right ventricular size is not well visualized.  3. The mitral valve is grossly normal. Trivial mitral valve regurgitation.  4. Tricuspid valve regurgitation not well assessed.  5. The  aortic valve was not well visualized. Aortic valve regurgitation is not visualized. No aortic stenosis is present.  6. Pulmonic valve regurgitation not assessed. FINDINGS  Left Ventricle: Left ventricular dimensions not well assessed. Left ventricular ejection fraction, by estimation, is 45 to 50%. The left ventricle has mildly decreased function. The left ventricle demonstrates regional wall motion abnormalities. Left ventricular diastolic parameters are consistent with Grade I diastolic dysfunction (impaired relaxation). Right Ventricle: The right ventricular size is not well visualized. Right  vetricular wall thickness was not assessed. Right ventricular systolic function was not well visualized. Left Atrium: Left atrial size was normal in size. Right Atrium: Right atrial size was normal in size. Pericardium: There is no evidence of pericardial effusion. Mitral Valve: The mitral valve is grossly normal. Trivial mitral valve regurgitation. Tricuspid Valve: The tricuspid valve is not well visualized. Tricuspid valve regurgitation not well assessed. Aortic Valve: The aortic valve was not well visualized. Aortic valve regurgitation is not visualized. No aortic stenosis is present. Aortic valve mean gradient measures 4.0 mmHg. Aortic valve peak gradient measures 6.9 mmHg. Aortic valve area, by VTI measures 1.64 cm. Pulmonic Valve: The pulmonic valve was not well visualized. Pulmonic valve regurgitation not assessed. Aorta: The aortic root was not well visualized. Pulmonary Artery: The pulmonary artery is not well seen. Venous: The inferior vena cava was not well visualized. IAS/Shunts: The interatrial septum was not well visualized.  LEFT VENTRICLE PLAX 2D LVIDd:         4.24 cm      Diastology LVIDs:         3.14 cm      LV e' lateral:   4.35 cm/s LV PW:         1.48 cm      LV E/e' lateral: 10.8 LV IVS:        1.12 cm      LV e' medial:    4.57 cm/s LVOT diam:     2.10 cm      LV E/e' medial:  10.3 LV SV:         50  LV SV Index:   23 LVOT Area:     3.46 cm  LV Volumes (MOD) LV vol d, MOD A2C: 144.0 ml LV vol s, MOD A2C: 67.4 ml LV vol s, MOD A4C: 63.4 ml LV SV MOD A2C:     76.6 ml RIGHT VENTRICLE RV S prime:     11.70 cm/s TAPSE (M-mode): 3.9 cm LEFT ATRIUM             Index       RIGHT ATRIUM           Index LA diam:        3.40 cm 1.61 cm/m  RA Area:     16.00 cm LA Vol (A2C):   45.1 ml 21.30 ml/m RA Volume:   39.80 ml  18.80 ml/m LA Vol (A4C):   24.0 ml 11.34 ml/m LA Biplane Vol: 35.6 ml 16.82 ml/m  AORTIC VALVE AV Area (Vmax):    1.60 cm AV Area (Vmean):   1.49 cm AV Area (VTI):     1.64 cm AV Vmax:           131.13 cm/s AV Vmean:          87.600 cm/s AV VTI:            0.303 m AV Peak Grad:      6.9 mmHg AV Mean Grad:      4.0 mmHg LVOT Vmax:         60.70 cm/s LVOT Vmean:        37.800 cm/s LVOT VTI:          0.143 m LVOT/AV VTI ratio: 0.47 MITRAL VALVE               TRICUSPID VALVE MV Area (PHT): 2.00 cm    TR Peak grad:   16.2 mmHg MV Decel Time: 379 msec  TR Vmax:        201.00 cm/s MV E velocity: 47.10 cm/s MV A velocity: 99.00 cm/s  SHUNTS MV E/A ratio:  0.48        Systemic VTI:  0.14 m                            Systemic Diam: 2.10 cm Nelva Bush MD Electronically signed by Nelva Bush MD Signature Date/Time: 07/07/2019/1:33:31 PM    Final     ECG & Cardiac Imaging    Initial ECG - SVT w/ aberrancy, 172, LAD, LVH, anterolateral ST depression - personally reviewed. F/u ECG - Sinus brady, 47, LAD, LAE, LVH, IVCD, inf infarct - personally reviewed.  Assessment & Plan    1.  Non-STEMI/coronary artery disease: Patient with a prior history of CAD status post PTCA of an unknown vessel 1991 followed by drug-eluting stent placement in 2005.  Most recent ischemic evaluation 2015 showed prior inferior infarct without evidence of ischemia.  He has been doing well at home, walking regularly without symptoms or limitations however, he woke at 4 AM with diaphoresis, weakness, and maxillary pain and  found himself to be hypotensive with pressures in the 80s, prompting ER evaluation.  Upon arrival to the ED, he developed chest discomfort and initial ECG in triage showed what likely represents SVT with aberrancy at a rate of 172.  This broke spontaneously was followed by bradycardia with rates in the 40s requiring atropine therapy.  Chest discomfort resolved within 30 minutes of ER arrival.  He is currently chest pain-free.  Though while tachycardic, he was noted to have anterolateral ST depression, follow-up ECG in sinus bradycardia does not show any significant ST or T changes.  Initial troponin was 20 with a follow-up high-sensitivity troponin of 794.  Presentation concerning for acute coronary syndrome.  He was treated w/ heparin and underwent cath revealing severe LAD dzs w/ CTO of the RCA.  Tx to [facility] for further eval.  Pain free.  Given bradycardia, will hold off on his home dose of carvedilol at this time but continue statin, Zetia, and ACE inhibitor therapy.  Follow-up echocardiogram.  2.  Essential hypertension: Hold beta-blocker in the setting of bradycardia.  Continue home dose of ACE inhibitor and follow.  3.  Hyperlipidemia: He is on statin and Zetia therapy with an LDL of 55 in January 2021.   4.  SVT with aberrancy vs VT: Noted on triage ECG and broke spontaneously.  In the setting of sinus bradycardia, will need to hold beta-blocker today and will ask EP to evaluate tomorrow.  5.  Sinus bradycardia: Rates are in the 40s following conversion from tachycardia in the ED.  He required atropine and rates have stabilized in the 50s to 60s.  Hold beta-blocker as above.   6.  Type 2 diabetes mellitus: Uses once weekly insulin @ home.  Will add SSI.  Cont glipizide.  Signed, Murray Hodgkins, NP 07/07/2019, 7:37 PM

## 2019-07-07 NOTE — Consult Note (Signed)
Cardiology Consult    Patient ID: [name] [name] MRN: [id number], DOB/AGE: [birth year]   Admit date: 07/07/2019 Date of Consult: 07/07/2019  Primary Physician: [name], [name] Primary Cardiologist: Ida Rogue, MD Requesting Provider: Tamela Gammon, MD  Patient Profile    [name] [name] is a 79 y.o. male with a history of CAD status post prior percutaneous interventions, hypertension, hyperlipidemia, type 2 diabetes mellitus, and ischemic cardiomyopathy, who is being seen today for the evaluation of non-STEMI at the request of Dr. Joan Mayans.  Past Medical History   Past Medical History:  Diagnosis Date  . Aortic insufficiency    a. noted on TTE 2015  . Arthritis   . CAD (coronary artery disease)    a. remote PCI in 1991 and 2005; b. MV 3/15: old inferior MI, no ischemia, LVEF 50%, slight inferior wall hypokniesis  . Chicken pox   . Colon polyps    4 pre-cancerous   . Diverticulitis   . DM type 2 (diabetes mellitus, type 2) ([place])   . Family history of adverse reaction to anesthesia   . GERD (gastroesophageal reflux disease)   . Heart murmur   . History of kidney stones   . Hyperlipidemia   . Hypertension   . Kidney stones   . Melanoma ([place]) 1980   Resected from his back  . Mitral regurgitation    a. noted on TTE 2015  . Myocardial infarction ([place])   . Systolic dysfunction    a. TTE 2015: EF  50-55%, mild global hypokinesis, mild to moderate aortic sclerosis, mild aortic insufficiency, mild to moderate mitral regurgitation, mild tricuspid regurgitation, moderately dilated left atrium, mildly dilated right ventricle, PASP 31 mmHg    Past Surgical History:  Procedure Laterality Date  . CHOLECYSTECTOMY  2012  . COLONOSCOPY     in 2003 with polyp removed and leak anastomosis had to have open abdominal surgery   . CORONARY ANGIOPLASTY WITH STENT PLACEMENT  1991 & 2005  . CYSTOSCOPY/URETEROSCOPY/HOLMIUM LASER/STENT PLACEMENT Right 01/02/2018   Procedure:  CYSTOSCOPY/URETEROSCOPY/HOLMIUM LASER/STENT PLACEMENT;  Surgeon: [name], MD;  Location: [facility] ORS;  Service: Urology;  Laterality: Right;  . ESOPHAGOGASTRODUODENOSCOPY (EGD) WITH PROPOFOL N/A 11/19/2016   Procedure: ESOPHAGOGASTRODUODENOSCOPY (EGD) WITH PROPOFOL;  Surgeon: [name], MD;  Location: [facility];  Service: Endoscopy;  Laterality: N/A;  . ESOPHAGOGASTRODUODENOSCOPY (EGD) WITH PROPOFOL N/A 02/02/2019   Procedure: ESOPHAGOGASTRODUODENOSCOPY (EGD) WITH PROPOFOL;  Surgeon: [name], MD;  Location: [facility] END[facility]COPY;  Service: Endoscopy;  Laterality: N/A;  . LITHOTRIPSY  2015  . MELANOMA EXCISION  1980   malignant  . NERVE SURGERY  2015   ulna nerve  . TONSILLECTOMY  1945  . WISDOM TOOTH EXTRACTION       Allergies  Allergies  Allergen Reactions  . Metformin And Related Diarrhea    Diarrhea, leg cramps  . Azithromycin Other (See Comments)    Not recommended - no reaction    History of Present Illness    79 year old male with above complex past medical history including coronary disease status post prior interventions with PTCA 1991 and subsequent drug-eluting stent placement to an unknown vessel in 2005 (presumably right coronary).  Other history includes hypertension, hyperlipidemia, type 2 diabetes mellitus, melanoma, and ischemic cardiomyopathy with an EF of 50-55% by echocardiogram March 2019.  Most recent ischemic testing took place in March 2015 and was nonischemic but did show an old inferior MI.  [name] [name] was last seen in cardiology clinic in May, at which time he  was doing well.  He notes that he and his wife walk several days per week and he has not been experiencing any exertional symptoms or limitations.  He has chronic sinus issues with congestion and occasional headaches.  He uses a Nettie pot as needed.  Over the past 2 weeks, he has periodically been waking at night feeling diaphoretic but without other symptoms.  This morning, he awoke at  approximately 4 AM feeling diaphoretic and somewhat weak.  He used the bathroom and upon returning to bed, he says he did not feel right.  He was not having chest pain but did note mild discomfort across his maxilla.  He checked his blood pressure and noted a systolic in the 35K.  He was not paying much attention to what his heart rate was but he is to be sure that it was in the 50s or 60s.  He checks his blood pressure a few more times both on the left and right arms, and each time, he was receiving readings in the 80s.  At this, he became concerned and alert his wife, who brought to the emergency department.  Upon arrival to the ED, he began to experience chest discomfort in addition to ongoing maxillary pain.  Initial triage ECG showed tachycardia at a rate of 172 bpm, felt to represent SVT with aberrancy.  The ED team had plan to give adenosine however, after placing an IV, he became bradycardic with rates in the 40s-question sinus bradycardia versus junctional rhythm per ER report.  Initial blood pressure was 68/49 and he was given atropine with improvement in heart rate and rise in blood pressures into the 1 teens.  Initial troponin was 20 however follow-up returned at 794.  Currently, patient is chest pain free.  Home Medications    Prior to Admission medications   Medication Sig Start Date End Date Taking? Authorizing Provider  carvedilol (COREG) 3.125 MG tablet TAKE 1 TABLET(3.125 MG) BY MOUTH TWICE DAILY 05/28/19  Yes [name], [name], MD  ezetimibe (ZETIA) 10 MG tablet TAKE 1 TABLET(10 MG) BY MOUTH DAILY 05/13/19  Yes [name], [name], MD  glipiZIDE (GLUCOTROL XL) 5 MG 24 hr tablet Take 1 tablet (5 mg total) by mouth 2 (two) times daily. With food 10/01/18  Yes McLean-Scocuzza, Nino Glow, MD  hydrochlorothiazide (HYDRODIURIL) 25 MG tablet TAKE 1 TABLET(25 MG) BY MOUTH DAILY 05/14/19  Yes [name], [name]  lisinopril (ZESTRIL) 10 MG tablet TAKE 1 TABLET BY MOUTH DAILY 02/26/19  Yes [name], [name]  rosuvastatin (CRESTOR) 10 MG tablet TAKE 1 TABLET(10 MG) BY MOUTH DAILY 05/14/19  Yes [name], [name], MD  Semaglutide,0.25 or 0.5MG /DOS, (OZEMPIC, 0.25 OR 0.5 MG/DOSE,) 2 MG/1.5ML SOPN Inject 1 mg into the skin once a week. 04/05/19  Yes [name], [name]  tamsulosin (FLOMAX) 0.4 MG CAPS capsule TAKE 1 CAPSULE(0.4 MG) BY MOUTH DAILY 10/27/18  Yes [name], MD  traMADol (ULTRAM) 50 MG tablet TAKE 1 TABLET BY MOUTH EVERY 12 HOURS AS NEEDED 05/14/19  Yes [name], [name]  acetaminophen (TYLENOL) 650 MG CR tablet Take 650 mg by mouth 2 (two) times a day.    [provider]  aspirin EC 81 MG tablet Take 81 mg by mouth daily.     [provider]  atorvastatin (LIPITOR) 80 MG tablet atorvastatin 80 mg tablet    [provider]  doxycycline (VIBRA-TABS) 100 MG tablet doxycycline hyclate 100 mg tablet    [provider]  fluconazole (DIFLUCAN) 150 MG tablet fluconazole 150 mg tablet    [provider]  fluticasone (FLONASE) 50 MCG/ACT nasal spray Place 2 sprays into both nostrils daily. Patient taking differently: Place 2 sprays into both nostrils daily as needed for allergies.  10/03/15   Thersa Salt G, DO  ipratropium (ATROVENT) 0.06 % nasal spray Place 2 sprays into both nostrils 3 (three) times daily. 10/23/18   McLean-Scocuzza, Nino Glow, MD  Multiple Vitamin (MULTIVITAMIN) capsule Take 1 capsule by mouth daily with lunch.     [provider]  naproxen sodium (ALEVE) 220 MG tablet Take 220 mg by mouth 2 (two) times daily as needed (for pain or headache).    [provider]  Omega-3 Fatty Acids (FISH OIL PO) Take 1 capsule by mouth daily.     [provider]  pantoprazole (PROTONIX) 40 MG tablet TAKE 1 TABLET BY MOUTH TWICE DAILY 02/08/19   [name], MD  Polyvinyl Alcohol-Povidone (MURINE TEARS FOR DRY EYES OP) Place 1-2 drops into both eyes daily as needed (for dry eyes).    [provider]  psyllium (METAMUCIL) 58.6 % powder Take 1 packet by mouth at bedtime.     [provider]    Family History    Family History  Problem Relation Age of Onset  . Hyperlipidemia Mother   . Hypertension Mother   . Heart disease Mother   . Diabetes Mother   . Heart attack Mother   . Colon cancer Father   . Lung cancer Father   . Kidney cancer Father        malignant capsulated kidney tumor  . Diabetes Father   . Liver cancer Father   . Bladder Cancer Neg Hx   . Prostate cancer Neg Hx    He indicated that his mother is deceased. He indicated that his father is deceased. He indicated that the status of his neg hx is unknown.  Social History    Social History   Socioeconomic History  . Marital status: Married    Spouse name: Not on file  . Number of children: Not on file  . Years of education: Not on file  . Highest education level: Not on file  Occupational History  . Not on file  Tobacco Use  . Smoking status: Current Some Day Smoker    Types: Pipe  . Smokeless tobacco: Former Systems developer    Quit date: 10/17/2015  Vaping Use  . Vaping Use: Never used  Substance and Sexual Activity  . Alcohol use: Yes    Alcohol/week: 0.0 - 1.0 standard drinks    Comment: none last 24hrs  . Drug use: No  . Sexual activity: Not on file  Other Topics Concern  . Not on file  Social History Narrative   Married    Social Determinants of Health   Financial Resource Strain: Medium Risk  . Difficulty of Paying Living Expenses: Somewhat hard  Food Insecurity:   . Worried About Charity fundraiser in the Last Year:   . Arboriculturist in the Last Year:   Transportation Needs:   . Film/video editor (Medical):   [name] Lack of Transportation (Non-Medical):   Physical Activity: Insufficiently Active  . Days of Exercise per Week: 3 days  . Minutes of Exercise per Session: 30 min  Stress:   . Feeling of Stress :   Social Connections:   . Frequency of Communication with Friends and  Family:   .  Frequency of Social Gatherings with Friends and Family:   . Attends Religious Services:   . Active Member of Clubs or Organizations:   . Attends Archivist Meetings:   [name] Marital Status:   Intimate Partner Violence:   . Fear of Current or Ex-Partner:   . Emotionally Abused:   [name] Physically Abused:   . Sexually Abused:      Review of Systems    General:  +++ weakness, fatigue this AM.  +++ diaphoresis @ night over the past 2 wks.  No chills, fever, weight changes.  Cardiovascular:  +++ chest pain, no dyspnea on exertion, edema, orthopnea, palpitations, paroxysmal nocturnal dyspnea. Dermatological: No rash, lesions/masses Respiratory: No cough, dyspnea Urologic: No hematuria, dysuria Abdominal:   No nausea, vomiting, diarrhea, bright red blood per rectum, melena, or hematemesis Neurologic:  No visual changes, wkns, changes in mental status. All other systems reviewed and are otherwise negative except as noted above.  Physical Exam    Blood pressure (!) 126/94, pulse 68, temperature 97.6 F (36.4 C), temperature source Oral, resp. rate 17, height 6' (1.829 m), weight 89.4 kg, SpO2 98 %.  General: Pleasant, NAD Psych: Normal affect. Neuro: Alert and oriented X 3. Moves all extremities spontaneously. HEENT: Normal  Neck: Supple without bruits or JVD. Lungs:  Resp regular and unlabored, CTA. Heart: RRR, distant, no s3, s4, or murmurs. Abdomen: Soft, non-tender, non-distended, BS + x 4.  Extremities: No clubbing, cyanosis or edema. DP/PT/Radials 2+ and equal bilaterally.  Labs    Cardiac Enzymes Recent Labs  Lab 07/07/19 0543 07/07/19 0745  TROPONINIHS 20* 794*      Lab Results  Component Value Date   WBC 7.9 07/07/2019   HGB 16.3 07/07/2019   HCT 46.0 07/07/2019   MCV 86.1 07/07/2019   PLT 193 07/07/2019    Recent Labs  Lab 07/07/19 0543  NA 139  K 4.0  CL 103  CO2 24  BUN 22  CREATININE 0.86  CALCIUM 9.1  PROT 7.4  BILITOT 2.0*   ALKPHOS 55  ALT 41  AST 47*  GLUCOSE 194*   Lab Results  Component Value Date   CHOL 120 02/10/2019   HDL 39.00 (L) 02/10/2019   LDLCALC 70 03/02/2018   TRIG 215.0 (H) 02/10/2019    Radiology Studies    No results found.  ECG & Cardiac Imaging    Initial ECG - SVT w/ aberrancy, 172, LAD, LVH, anterolateral ST depression - personally reviewed. F/u ECG - Sinus brady, 47, LAD, LAE, LVH, IVCD, inf infarct - personally reviewed.  Assessment & Plan    1.  Non-STEMI/coronary artery disease: Patient with a prior history of CAD status post PTCA of an unknown vessel 1991 followed by drug-eluting stent placement in 2005.  Most recent ischemic evaluation 2015 showed prior inferior infarct without evidence of ischemia.  He has been doing well at home, walking regularly without symptoms or limitations however, he woke at 4 AM with diaphoresis, weakness, and maxillary pain and found himself to be hypotensive with pressures in the 80s, prompting ER evaluation.  Upon arrival to the ED, he developed chest discomfort and initial ECG in triage showed what likely represents SVT with aberrancy at a rate of 172.  This broke spontaneously was followed by bradycardia with rates in the 40s requiring atropine therapy.  Chest discomfort resolved within 30 minutes of ER arrival.  He is currently chest pain-free.  Though while tachycardic, he was noted to have anterolateral ST depression, follow-up  ECG in sinus bradycardia does not show any significant ST or T changes.  Initial troponin was 20 with a follow-up high-sensitivity troponin of 794.  Presentation concerning for acute coronary syndrome.  He has received aspirin and I am adding heparin.  We will plan on diagnostic catheterization this a.m.  Given bradycardia, will hold off on his home dose of carvedilol at this time but continue statin, Zetia, and ACE inhibitor therapy.  Follow-up echocardiogram.  2.  Essential hypertension: Hold beta-blocker in the setting of  bradycardia.  Continue home dose of ACE inhibitor and follow.  3.  Hyperlipidemia: He is on statin and Zetia therapy with an LDL of 55 in January 2021.  We will need to confirm what statin he is taking his both rosuvastatin and atorvastatin are listed on his home meds.  4.  SVT with aberrancy: Noted on triage ECG and broke spontaneously.  In the setting of sinus bradycardia, will need to hold beta-blocker today and will ask EP to evaluate tomorrow.  5.  Sinus bradycardia: Rates are in the 40s following conversion from tachycardia in the ED.  He required atropine and rates have stabilized in the 50s to 60s.  Hold beta-blocker as above.  Ischemic evaluation as above.  6.  Type 2 diabetes mellitus: Continue home + scale insulin per internal medicine.  Signed, Murray Hodgkins, NP 07/07/2019, 8:30 AM  For questions or updates, please contact   Please consult [email or web address] for contact info under Cardiology/STEMI.

## 2019-07-07 NOTE — Interval H&P Note (Signed)
History and Physical Interval Note:  07/07/2019 10:59 AM  [name]  has presented today for surgery, with the diagnosis of NSTEMI.  The various methods of treatment have been discussed with the patient and family. After consideration of risks, benefits and other options for treatment, the patient has consented to  Procedure(s): LEFT HEART CATH AND CORONARY ANGIOGRAPHY (N/A) as a surgical intervention.  The patient's history has been reviewed, patient examined, no change in status, stable for surgery.  I have reviewed the patient's chart and labs.  Questions were answered to the patient's satisfaction.    Cath Lab Visit (complete for each Cath Lab visit)  Clinical Evaluation Leading to the Procedure:   ACS: Yes.    Non-ACS:  N/A  Mykeal Carrick

## 2019-07-07 NOTE — ED Notes (Signed)
Pt placed on Zoll pads/monitor per EDP

## 2019-07-07 NOTE — Consult Note (Signed)
ANTICOAGULATION CONSULT NOTE - Initial Consult  Pharmacy Consult for Heparin Drip Indication: chest pain/ACS/STEMI  Allergies  Allergen Reactions  . Metformin And Related Diarrhea    Diarrhea, leg cramps  . Azithromycin Other (See Comments)    Not recommended - no reaction    Patient Measurements: Height: 6\' 1"  (185.4 cm) Weight: 93.4 kg (206 lb) IBW/kg (Calculated) : 79.9 Heparin Dosing Weight: 89.4kg  Vital Signs: Temp: 97.8 F (36.6 C) (06/23 1725) Temp Source: Oral (06/23 1725) BP: 139/81 (06/23 1725) Pulse Rate: 60 (06/23 1725)  Labs: Recent Labs    07/07/19 0543 07/07/19 0639 07/07/19 0745 07/07/19 0940  HGB 16.3  --   --   --   HCT 46.0  --   --   --   PLT 193  --   --   --   APTT  --   --   --  30  LABPROT  --  13.8  --   --   INR  --  1.1  --   --   CREATININE 0.86  --   --   --   TROPONINIHS 20*  --  794*  --     Estimated Creatinine Clearance: 78.7 mL/min (by C-G formula based on SCr of 0.86 mg/dL).   Medical History: Past Medical History:  Diagnosis Date  . Aortic insufficiency    a. noted on TTE 2015  . Arthritis   . CAD (coronary artery disease)    a. remote PCI in 1991 and 2005; b. MV 3/15: old inferior MI, no ischemia, LVEF 50%, slight inferior wall hypokniesis  . Chicken pox   . Colon polyps    4 pre-cancerous   . Diverticulitis   . DM type 2 (diabetes mellitus, type 2) ([place])   . Family history of adverse reaction to anesthesia   . GERD (gastroesophageal reflux disease)   . Heart murmur   . History of kidney stones   . HOH (hard of hearing)   . Hyperlipidemia   . Hypertension   . Kidney stones   . Melanoma ([place]) 1980   Resected from his back  . Mitral regurgitation    a. noted on TTE 2015  . Myocardial infarction ([place])   . Systolic dysfunction    a. TTE 2015: EF  50-55%, mild global hypokinesis, mild to moderate aortic sclerosis, mild aortic insufficiency, mild to moderate mitral regurgitation, mild tricuspid regurgitation,  moderately dilated left atrium, mildly dilated right ventricle, PASP 31 mmHg    Medications:  No PTA Anticoagulation of record noted  Assessment: 79 yo male with chest tightness and elevated troponin.  He is now s/p cath with plans for  atherectomy/PCI of LAD on 6/24. Heparin to restart 2 hours after TR band removed (sheath removed ~ 12pm) -TR band is off   Goal of Therapy:  Heparin level 0.3-0.7 units/ml Monitor platelets by anticoagulation protocol: Yes   Plan:  -Re-start heparin infusion at 1150 units/hr -Heparin level in 6 hours and daily wth CBC daily  Hildred Laser, PharmD Clinical Pharmacist **Pharmacist phone directory can now be found on [email or web address] ([email or web address]).  Listed under [place].

## 2019-07-07 NOTE — ED Notes (Signed)
Cardiology at bedside.

## 2019-07-07 NOTE — Progress Notes (Signed)
*  PRELIMINARY RESULTS* Echocardiogram 2D Echocardiogram has been performed.  Sherrie Sport 07/07/2019, 1:10 PM

## 2019-07-07 NOTE — H&P (View-Only) (Signed)
Cardiology Consult    Patient ID: [name] [name] MRN: [id number], DOB/AGE: Nov 13, 1940   Admit date: 07/07/2019 Date of Consult: 07/07/2019  Primary Physician: [name], [name] Primary Cardiologist: Ida Rogue, MD Requesting Provider: Tamela Gammon, MD  Patient Profile    [name] [name] is a 79 y.o. male with a history of CAD status post prior percutaneous interventions, hypertension, hyperlipidemia, type 2 diabetes mellitus, and ischemic cardiomyopathy, who is being seen today for the evaluation of non-STEMI at the request of Dr. Joan Mayans.  Past Medical History   Past Medical History:  Diagnosis Date  . Aortic insufficiency    a. noted on TTE 2015  . Arthritis   . CAD (coronary artery disease)    a. remote PCI in 1991 and 2005; b. MV 3/15: old inferior MI, no ischemia, LVEF 50%, slight inferior wall hypokniesis  . Chicken pox   . Colon polyps    4 pre-cancerous   . Diverticulitis   . DM type 2 (diabetes mellitus, type 2) ([place])   . Family history of adverse reaction to anesthesia   . GERD (gastroesophageal reflux disease)   . Heart murmur   . History of kidney stones   . Hyperlipidemia   . Hypertension   . Kidney stones   . Melanoma ([place]) 1980   Resected from his back  . Mitral regurgitation    a. noted on TTE 2015  . Myocardial infarction ([place])   . Systolic dysfunction    a. TTE 2015: EF  50-55%, mild global hypokinesis, mild to moderate aortic sclerosis, mild aortic insufficiency, mild to moderate mitral regurgitation, mild tricuspid regurgitation, moderately dilated left atrium, mildly dilated right ventricle, PASP 31 mmHg    Past Surgical History:  Procedure Laterality Date  . CHOLECYSTECTOMY  2012  . COLONOSCOPY     in 2003 with polyp removed and leak anastomosis had to have open abdominal surgery   . CORONARY ANGIOPLASTY WITH STENT PLACEMENT  1991 & 2005  . CYSTOSCOPY/URETEROSCOPY/HOLMIUM LASER/STENT PLACEMENT Right 01/02/2018   Procedure:  CYSTOSCOPY/URETEROSCOPY/HOLMIUM LASER/STENT PLACEMENT;  Surgeon: [name], MD;  Location: [facility] ORS;  Service: Urology;  Laterality: Right;  . ESOPHAGOGASTRODUODENOSCOPY (EGD) WITH PROPOFOL N/A 11/19/2016   Procedure: ESOPHAGOGASTRODUODENOSCOPY (EGD) WITH PROPOFOL;  Surgeon: [name], MD;  Location: [facility];  Service: Endoscopy;  Laterality: N/A;  . ESOPHAGOGASTRODUODENOSCOPY (EGD) WITH PROPOFOL N/A 02/02/2019   Procedure: ESOPHAGOGASTRODUODENOSCOPY (EGD) WITH PROPOFOL;  Surgeon: [name], MD;  Location: [facility] END[facility]COPY;  Service: Endoscopy;  Laterality: N/A;  . LITHOTRIPSY  2015  . MELANOMA EXCISION  1980   malignant  . NERVE SURGERY  2015   ulna nerve  . TONSILLECTOMY  1945  . WISDOM TOOTH EXTRACTION       Allergies  Allergies  Allergen Reactions  . Metformin And Related Diarrhea    Diarrhea, leg cramps  . Azithromycin Other (See Comments)    Not recommended - no reaction    History of Present Illness    79 year old male with above complex past medical history including coronary disease status post prior interventions with PTCA 1991 and subsequent drug-eluting stent placement to an unknown vessel in 2005 (presumably right coronary).  Other history includes hypertension, hyperlipidemia, type 2 diabetes mellitus, melanoma, and ischemic cardiomyopathy with an EF of 50-55% by echocardiogram March 2019.  Most recent ischemic testing took place in March 2015 and was nonischemic but did show an old inferior MI.  [name] [name] was last seen in cardiology clinic in May, at which time he  was doing well.  He notes that he and his wife walk several days per week and he has not been experiencing any exertional symptoms or limitations.  He has chronic sinus issues with congestion and occasional headaches.  He uses a Nettie pot as needed.  Over the past 2 weeks, he has periodically been waking at night feeling diaphoretic but without other symptoms.  This morning, he awoke at  approximately 4 AM feeling diaphoretic and somewhat weak.  He used the bathroom and upon returning to bed, he says he did not feel right.  He was not having chest pain but did note mild discomfort across his maxilla.  He checked his blood pressure and noted a systolic in the 37J.  He was not paying much attention to what his heart rate was but he is to be sure that it was in the 50s or 60s.  He checks his blood pressure a few more times both on the left and right arms, and each time, he was receiving readings in the 80s.  At this, he became concerned and alert his wife, who brought to the emergency department.  Upon arrival to the ED, he began to experience chest discomfort in addition to ongoing maxillary pain.  Initial triage ECG showed tachycardia at a rate of 172 bpm, felt to represent SVT with aberrancy.  The ED team had plan to give adenosine however, after placing an IV, he became bradycardic with rates in the 40s-question sinus bradycardia versus junctional rhythm per ER report.  Initial blood pressure was 68/49 and he was given atropine with improvement in heart rate and rise in blood pressures into the 1 teens.  Initial troponin was 20 however follow-up returned at 794.  Currently, patient is chest pain free.  Home Medications    Prior to Admission medications   Medication Sig Start Date End Date Taking? Authorizing Provider  carvedilol (COREG) 3.125 MG tablet TAKE 1 TABLET(3.125 MG) BY MOUTH TWICE DAILY 05/28/19  Yes [name], [name], MD  ezetimibe (ZETIA) 10 MG tablet TAKE 1 TABLET(10 MG) BY MOUTH DAILY 05/13/19  Yes [name], [name], MD  glipiZIDE (GLUCOTROL XL) 5 MG 24 hr tablet Take 1 tablet (5 mg total) by mouth 2 (two) times daily. With food 10/01/18  Yes McLean-Scocuzza, Nino Glow, MD  hydrochlorothiazide (HYDRODIURIL) 25 MG tablet TAKE 1 TABLET(25 MG) BY MOUTH DAILY 05/14/19  Yes [name], [name]  lisinopril (ZESTRIL) 10 MG tablet TAKE 1 TABLET BY MOUTH DAILY 02/26/19  Yes [name], [name]  rosuvastatin (CRESTOR) 10 MG tablet TAKE 1 TABLET(10 MG) BY MOUTH DAILY 05/14/19  Yes [name], [name], MD  Semaglutide,0.25 or 0.5MG /DOS, (OZEMPIC, 0.25 OR 0.5 MG/DOSE,) 2 MG/1.5ML SOPN Inject 1 mg into the skin once a week. 04/05/19  Yes [name], [name]  tamsulosin (FLOMAX) 0.4 MG CAPS capsule TAKE 1 CAPSULE(0.4 MG) BY MOUTH DAILY 10/27/18  Yes [name], MD  traMADol (ULTRAM) 50 MG tablet TAKE 1 TABLET BY MOUTH EVERY 12 HOURS AS NEEDED 05/14/19  Yes [name], [name]  acetaminophen (TYLENOL) 650 MG CR tablet Take 650 mg by mouth 2 (two) times a day.    [provider]  aspirin EC 81 MG tablet Take 81 mg by mouth daily.     [provider]  atorvastatin (LIPITOR) 80 MG tablet atorvastatin 80 mg tablet    [provider]  doxycycline (VIBRA-TABS) 100 MG tablet doxycycline hyclate 100 mg tablet    [provider]  fluconazole (DIFLUCAN) 150 MG tablet fluconazole 150 mg tablet    [provider]  fluticasone (FLONASE) 50 MCG/ACT nasal spray Place 2 sprays into both nostrils daily. Patient taking differently: Place 2 sprays into both nostrils daily as needed for allergies.  10/03/15   Thersa Salt G, DO  ipratropium (ATROVENT) 0.06 % nasal spray Place 2 sprays into both nostrils 3 (three) times daily. 10/23/18   McLean-Scocuzza, Nino Glow, MD  Multiple Vitamin (MULTIVITAMIN) capsule Take 1 capsule by mouth daily with lunch.     [provider]  naproxen sodium (ALEVE) 220 MG tablet Take 220 mg by mouth 2 (two) times daily as needed (for pain or headache).    [provider]  Omega-3 Fatty Acids (FISH OIL PO) Take 1 capsule by mouth daily.     [provider]  pantoprazole (PROTONIX) 40 MG tablet TAKE 1 TABLET BY MOUTH TWICE DAILY 02/08/19   [name], MD  Polyvinyl Alcohol-Povidone (MURINE TEARS FOR DRY EYES OP) Place 1-2 drops into both eyes daily as needed (for dry eyes).    [provider]  psyllium (METAMUCIL) 58.6 % powder Take 1 packet by mouth at bedtime.     [provider]    Family History    Family History  Problem Relation Age of Onset  . Hyperlipidemia Mother   . Hypertension Mother   . Heart disease Mother   . Diabetes Mother   . Heart attack Mother   . Colon cancer Father   . Lung cancer Father   . Kidney cancer Father        malignant capsulated kidney tumor  . Diabetes Father   . Liver cancer Father   . Bladder Cancer Neg Hx   . Prostate cancer Neg Hx    He indicated that his mother is deceased. He indicated that his father is deceased. He indicated that the status of his neg hx is unknown.  Social History    Social History   Socioeconomic History  . Marital status: Married    Spouse name: Not on file  . Number of children: Not on file  . Years of education: Not on file  . Highest education level: Not on file  Occupational History  . Not on file  Tobacco Use  . Smoking status: Current Some Day Smoker    Types: Pipe  . Smokeless tobacco: Former Systems developer    Quit date: 10/17/2015  Vaping Use  . Vaping Use: Never used  Substance and Sexual Activity  . Alcohol use: Yes    Alcohol/week: 0.0 - 1.0 standard drinks    Comment: none last 24hrs  . Drug use: No  . Sexual activity: Not on file  Other Topics Concern  . Not on file  Social History Narrative   Married    Social Determinants of Health   Financial Resource Strain: Medium Risk  . Difficulty of Paying Living Expenses: Somewhat hard  Food Insecurity:   . Worried About Charity fundraiser in the Last Year:   . Arboriculturist in the Last Year:   Transportation Needs:   . Film/video editor (Medical):   [name] Lack of Transportation (Non-Medical):   Physical Activity: Insufficiently Active  . Days of Exercise per Week: 3 days  . Minutes of Exercise per Session: 30 min  Stress:   . Feeling of Stress :   Social Connections:   . Frequency of Communication with Friends and  Family:   .  Frequency of Social Gatherings with Friends and Family:   . Attends Religious Services:   . Active Member of Clubs or Organizations:   . Attends Archivist Meetings:   [name] Marital Status:   Intimate Partner Violence:   . Fear of Current or Ex-Partner:   . Emotionally Abused:   [name] Physically Abused:   . Sexually Abused:      Review of Systems    General:  +++ weakness, fatigue this AM.  +++ diaphoresis @ night over the past 2 wks.  No chills, fever, weight changes.  Cardiovascular:  +++ chest pain, no dyspnea on exertion, edema, orthopnea, palpitations, paroxysmal nocturnal dyspnea. Dermatological: No rash, lesions/masses Respiratory: No cough, dyspnea Urologic: No hematuria, dysuria Abdominal:   No nausea, vomiting, diarrhea, bright red blood per rectum, melena, or hematemesis Neurologic:  No visual changes, wkns, changes in mental status. All other systems reviewed and are otherwise negative except as noted above.  Physical Exam    Blood pressure (!) 126/94, pulse 68, temperature 97.6 F (36.4 C), temperature source Oral, resp. rate 17, height 6' (1.829 m), weight 89.4 kg, SpO2 98 %.  General: Pleasant, NAD Psych: Normal affect. Neuro: Alert and oriented X 3. Moves all extremities spontaneously. HEENT: Normal  Neck: Supple without bruits or JVD. Lungs:  Resp regular and unlabored, CTA. Heart: RRR, distant, no s3, s4, or murmurs. Abdomen: Soft, non-tender, non-distended, BS + x 4.  Extremities: No clubbing, cyanosis or edema. DP/PT/Radials 2+ and equal bilaterally.  Labs    Cardiac Enzymes Recent Labs  Lab 07/07/19 0543 07/07/19 0745  TROPONINIHS 20* 794*      Lab Results  Component Value Date   WBC 7.9 07/07/2019   HGB 16.3 07/07/2019   HCT 46.0 07/07/2019   MCV 86.1 07/07/2019   PLT 193 07/07/2019    Recent Labs  Lab 07/07/19 0543  NA 139  K 4.0  CL 103  CO2 24  BUN 22  CREATININE 0.86  CALCIUM 9.1  PROT 7.4  BILITOT 2.0*   ALKPHOS 55  ALT 41  AST 47*  GLUCOSE 194*   Lab Results  Component Value Date   CHOL 120 02/10/2019   HDL 39.00 (L) 02/10/2019   LDLCALC 70 03/02/2018   TRIG 215.0 (H) 02/10/2019    Radiology Studies    No results found.  ECG & Cardiac Imaging    Initial ECG - SVT w/ aberrancy, 172, LAD, LVH, anterolateral ST depression - personally reviewed. F/u ECG - Sinus brady, 47, LAD, LAE, LVH, IVCD, inf infarct - personally reviewed.  Assessment & Plan    1.  Non-STEMI/coronary artery disease: Patient with a prior history of CAD status post PTCA of an unknown vessel 1991 followed by drug-eluting stent placement in 2005.  Most recent ischemic evaluation 2015 showed prior inferior infarct without evidence of ischemia.  He has been doing well at home, walking regularly without symptoms or limitations however, he woke at 4 AM with diaphoresis, weakness, and maxillary pain and found himself to be hypotensive with pressures in the 80s, prompting ER evaluation.  Upon arrival to the ED, he developed chest discomfort and initial ECG in triage showed what likely represents SVT with aberrancy at a rate of 172.  This broke spontaneously was followed by bradycardia with rates in the 40s requiring atropine therapy.  Chest discomfort resolved within 30 minutes of ER arrival.  He is currently chest pain-free.  Though while tachycardic, he was noted to have anterolateral ST depression, follow-up  ECG in sinus bradycardia does not show any significant ST or T changes.  Initial troponin was 20 with a follow-up high-sensitivity troponin of 794.  Presentation concerning for acute coronary syndrome.  He has received aspirin and I am adding heparin.  We will plan on diagnostic catheterization this a.m.  Given bradycardia, will hold off on his home dose of carvedilol at this time but continue statin, Zetia, and ACE inhibitor therapy.  Follow-up echocardiogram.  2.  Essential hypertension: Hold beta-blocker in the setting of  bradycardia.  Continue home dose of ACE inhibitor and follow.  3.  Hyperlipidemia: He is on statin and Zetia therapy with an LDL of 55 in January 2021.  We will need to confirm what statin he is taking his both rosuvastatin and atorvastatin are listed on his home meds.  4.  SVT with aberrancy: Noted on triage ECG and broke spontaneously.  In the setting of sinus bradycardia, will need to hold beta-blocker today and will ask EP to evaluate tomorrow.  5.  Sinus bradycardia: Rates are in the 40s following conversion from tachycardia in the ED.  He required atropine and rates have stabilized in the 50s to 60s.  Hold beta-blocker as above.  Ischemic evaluation as above.  6.  Type 2 diabetes mellitus: Continue home + scale insulin per internal medicine.  Signed, Murray Hodgkins, NP 07/07/2019, 8:30 AM  For questions or updates, please contact   Please consult [email or web address] for contact info under Cardiology/STEMI.

## 2019-07-07 NOTE — ED Provider Notes (Signed)
[facility] Emergency Department Provider Note  ____________________________________________   First MD Initiated Contact with Patient 07/07/19 [phone]     (approximate)  I have reviewed the triage vital signs and the nursing notes.   HISTORY  Chief Complaint Chest Pain    HPI [name] [name] is a 79 y.o. male with a history of extensive coronary artery disease and aortic insufficiency as well as at least a mild degree of systolic heart function. Dr. Rockey Situ is his cardiologist. He presents by private vehicle tonight for evaluation of acute onset chest pressure that he describes as severe and which is accompanied with some lightheadedness, sweating, and some nausea.  He reports that he woke up at approximately 4 AM with the symptoms and when they were still present at 5 AM he came to the emergency department. Upon arrival to triage, he was found to be hypotensive with a systolic blood pressure in the 60s, diaphoretic, pale, and with a rapid heart rate in the upper 170s. He was brought immediately back to an exam room.  The patient reports that this does not feel like his prior heart attacks. He says that his normal heart rate is in the 70s but he has been evaluated in the past for bradycardia. He is not certain what is his normal blood pressure. He has no new medications or significant changes to medications. He denies fever, sore throat, shortness of breath. He insists that he is not having pain, "just pressure".         Past Medical History:  Diagnosis Date  . Aortic insufficiency    a. noted on TTE 2015  . Arthritis   . CAD (coronary artery disease)    a. remote PCI in 1991 and 2005; b. MV 3/15: old inferior MI, no ischemia, LVEF 50%, slight inferior wall hypokniesis  . Chicken pox   . Colon polyps    4 pre-cancerous   . Diverticulitis   . DM type 2 (diabetes mellitus, type 2) ([place])   . Family history of adverse reaction to anesthesia   . GERD  (gastroesophageal reflux disease)   . Heart murmur   . History of kidney stones   . Hyperlipidemia   . Hypertension   . Kidney stones   . Melanoma ([place]) 1980   Resected from his back  . Mitral regurgitation    a. noted on TTE 2015  . Myocardial infarction ([place])   . Systolic dysfunction    a. TTE 2015: EF  50-55%, mild global hypokinesis, mild to moderate aortic sclerosis, mild aortic insufficiency, mild to moderate mitral regurgitation, mild tricuspid regurgitation, moderately dilated left atrium, mildly dilated right ventricle, PASP 31 mmHg    Patient Active Problem List   Diagnosis Date Noted  . De Quervain's tenosynovitis, left 05/17/2019  . Esophagitis, unspecified without bleeding   . Esophageal ulcer 11/20/2018  . Acute left flank pain 10/23/2018  . Post-nasal drip 10/23/2018  . Allergic rhinitis 10/23/2018  . Right shoulder pain 07/03/2018  . Skin lesion 06/12/2018  . Pruritus 06/12/2018  . Kidney stones, calcium oxalate 05/28/2018  . Atherosclerosis of native coronary artery with stable angina pectoris ([place]) 05/17/2018  . GERD (gastroesophageal reflux disease) 02/23/2018  . SCC (squamous cell carcinoma) 05/15/2017  . Cervical stenosis of spine 05/15/2017  . Tobacco abuse 01/26/2017  . Cardiac murmur 01/08/2017  . Right renal mass 10/31/2016  . Abdominal pain 10/31/2016  . Fatty liver 10/31/2016  . Hematuria 10/31/2016  . Acute non-recurrent maxillary sinusitis  10/17/2015  . Carotid artery disease ([place]) 10/03/2015  . Atherosclerosis of abdominal aorta ([place]) 07/28/2015  . CAD (coronary artery disease) 07/10/2015  . Essential hypertension 07/10/2015  . Mixed hyperlipidemia 07/10/2015  . Controlled type 2 diabetes mellitus with complication, without long-term current use of insulin ([place]) 07/10/2015  . History of melanoma 07/10/2015  . Chronic low back pain 07/10/2015  . BPH (benign prostatic hyperplasia) 07/10/2015  . Bilateral hand numbness 07/10/2015  . Insomnia  07/10/2015    Past Surgical History:  Procedure Laterality Date  . CHOLECYSTECTOMY  2012  . COLONOSCOPY     in 2003 with polyp removed and leak anastomosis had to have open abdominal surgery   . CORONARY ANGIOPLASTY WITH STENT PLACEMENT  1991 & 2005  . CYSTOSCOPY/URETEROSCOPY/HOLMIUM LASER/STENT PLACEMENT Right 01/02/2018   Procedure: CYSTOSCOPY/URETEROSCOPY/HOLMIUM LASER/STENT PLACEMENT;  Surgeon: [name], MD;  Location: [facility] ORS;  Service: Urology;  Laterality: Right;  . ESOPHAGOGASTRODUODENOSCOPY (EGD) WITH PROPOFOL N/A 11/19/2016   Procedure: ESOPHAGOGASTRODUODENOSCOPY (EGD) WITH PROPOFOL;  Surgeon: [name], MD;  Location: [facility] ENDOSCOPY;  Service: Endoscopy;  Laterality: N/A;  . ESOPHAGOGASTRODUODENOSCOPY (EGD) WITH PROPOFOL N/A 02/02/2019   Procedure: ESOPHAGOGASTRODUODENOSCOPY (EGD) WITH PROPOFOL;  Surgeon: [name], MD;  Location: [facility] ENDOSCOPY;  Service: Endoscopy;  Laterality: N/A;  . LITHOTRIPSY  2015  . MELANOMA EXCISION  1980   malignant  . NERVE SURGERY  2015   ulna nerve  . TONSILLECTOMY  1945  . WISDOM TOOTH EXTRACTION      Prior to Admission medications   Medication Sig Start Date End Date Taking? Authorizing Provider  acetaminophen (TYLENOL) 650 MG CR tablet Take 650 mg by mouth 2 (two) times a day.    [provider]  aspirin EC 81 MG tablet Take 81 mg by mouth daily.     [provider]  Blood Glucose Monitoring Suppl (ONETOUCH VERIO) w/Device KIT 1 Device by Does not apply route daily. 04/26/19   [name], [name]  carvedilol (COREG) 3.125 MG tablet TAKE 1 TABLET(3.125 MG) BY MOUTH TWICE DAILY 05/28/19   [name], MD  ezetimibe (ZETIA) 10 MG tablet TAKE 1 TABLET(10 MG) BY MOUTH DAILY 05/13/19   [name], MD  fluticasone (FLONASE) 50 MCG/ACT nasal spray Place 2 sprays into both nostrils daily. Patient taking differently: Place 2 sprays into both nostrils daily as needed for allergies.  10/03/15   Coral Spikes,  DO  glipiZIDE (GLUCOTROL XL) 5 MG 24 hr tablet Take 1 tablet (5 mg total) by mouth 2 (two) times daily. With food 10/01/18   McLean-Scocuzza, Nino Glow, MD  glucose blood (ONETOUCH VERIO) test strip Used to check blood sugars twice a day. 05/18/19   [name], [name]  hydrochlorothiazide (HYDRODIURIL) 25 MG tablet TAKE 1 TABLET(25 MG) BY MOUTH DAILY 05/14/19   [name], [name]  Insulin Pen Needle (PEN NEEDLES) 30G X 8 MM MISC 1 Device by Does not apply route once a week. 10/23/18   McLean-Scocuzza, Nino Glow, MD  ipratropium (ATROVENT) 0.06 % nasal spray Place 2 sprays into both nostrils 3 (three) times daily. 10/23/18   McLean-Scocuzza, Nino Glow, MD  lisinopril (ZESTRIL) 10 MG tablet TAKE 1 TABLET BY MOUTH DAILY 02/26/19   [name], [name]  Multiple Vitamin (MULTIVITAMIN) capsule Take 1 capsule by mouth daily with lunch.     [provider]  naproxen sodium (ALEVE) 220 MG tablet Take 220 mg by mouth 2 (two) times daily as needed (for pain or  headache).    [provider]  Omega-3 Fatty Acids (FISH OIL PO) Take 1 capsule by mouth daily.     [provider]  OneTouch Delica Lancets 35T MISC Used to check blood sugars twice a day. 05/18/19   [name], [name]  pantoprazole (PROTONIX) 40 MG tablet TAKE 1 TABLET BY MOUTH TWICE DAILY 02/08/19   [name], MD  Polyvinyl Alcohol-Povidone (MURINE TEARS FOR DRY EYES OP) Place 1-2 drops into both eyes daily as needed (for dry eyes).    [provider]  psyllium (METAMUCIL) 58.6 % powder Take 1 packet by mouth at bedtime.     [provider]  rosuvastatin (CRESTOR) 10 MG tablet TAKE 1 TABLET(10 MG) BY MOUTH DAILY 05/14/19   [name], [name], MD  Semaglutide,0.25 or 0.5MG/DOS, (OZEMPIC, 0.25 OR 0.5 MG/DOSE,) 2 MG/1.5ML SOPN Inject 1 mg into the skin once a week. 04/05/19   [name], [name]  tamsulosin (FLOMAX) 0.4 MG CAPS capsule TAKE 1 CAPSULE(0.4 MG) BY MOUTH DAILY 10/27/18   [name], MD   traMADol (ULTRAM) 50 MG tablet TAKE 1 TABLET BY MOUTH EVERY 12 HOURS AS NEEDED 05/14/19   [name], [name]    Allergies Metformin and related and Azithromycin  Family History  Problem Relation Age of Onset  . Hyperlipidemia Mother   . Hypertension Mother   . Heart disease Mother   . Diabetes Mother   . Heart attack Mother   . Colon cancer Father   . Lung cancer Father   . Kidney cancer Father        malignant capsulated kidney tumor  . Diabetes Father   . Liver cancer Father   . Bladder Cancer Neg Hx   . Prostate cancer Neg Hx     Social History Social History   Tobacco Use  . Smoking status: Current Some Day Smoker    Types: Pipe  . Smokeless tobacco: Former Systems developer    Quit date: 10/17/2015  Vaping Use  . Vaping Use: Never used  Substance Use Topics  . Alcohol use: Yes    Alcohol/week: 0.0 - 1.0 standard drinks    Comment: none last 24hrs  . Drug use: No    Review of Systems Constitutional: No fever/chills Eyes: No visual changes. ENT: No sore throat. Cardiovascular: + Chest pressure. Respiratory: Mild shortness of breath associated with chest pressure, then improved. Gastrointestinal: No abdominal pain.  Nausea, no vomiting.  No diarrhea.  No constipation. Genitourinary: Negative for dysuria. Musculoskeletal: Negative for neck pain.  Negative for back pain. Integumentary: + Sweating. Negative for rash. Neurological: Negative for headaches, focal weakness or numbness.   ____________________________________________   PHYSICAL EXAM:  VITAL SIGNS: ED Triage Vitals  Enc Vitals Group     BP 07/07/19 0523 (!) 68/49     Pulse Rate 07/07/19 0523 63     Resp 07/07/19 0523 12     Temp 07/07/19 0523 97.6 F (36.4 C)     Temp Source 07/07/19 0523 Oral     SpO2 07/07/19 0523 96 %     Weight 07/07/19 0528 89.4 kg (197 lb)     Height 07/07/19 0528 1.829 m (6')     Head Circumference --      Peak Flow --      Pain Score 07/07/19 0528 2     Pain Loc --       Pain Edu? --      Excl. in [place]? --  Constitutional: Alert and oriented. Ill-appearing. Eyes: Conjunctivae are normal.  Head: Atraumatic. Nose: No congestion/rhinnorhea. Mouth/Throat: Patient is wearing a mask. Neck: No stridor.  No meningeal signs.   Cardiovascular: Heart rate was elevated in triage but when I saw him within minutes he was down to having a heart rate in the 40s., regular rhythm. Good peripheral circulation. Grossly normal heart sounds. Respiratory: Normal respiratory effort.  No retractions. Gastrointestinal: Soft and nontender. No distention.  Musculoskeletal: No lower extremity tenderness nor edema. No gross deformities of extremities. Neurologic:  Normal speech and language. No gross focal neurologic deficits are appreciated.  Skin:  Skin is pale, warm, diaphoretic and intact. Psychiatric: Mood and affect are normal. Speech and behavior are normal.  ____________________________________________   LABS (all labs ordered are listed, but only abnormal results are displayed)  Labs Reviewed  MAGNESIUM - Abnormal; Notable for the following components:      Result Value   Magnesium 1.4 (*)    All other components within normal limits  COMPREHENSIVE METABOLIC PANEL - Abnormal; Notable for the following components:   Glucose, Bld 194 (*)    AST 47 (*)    Total Bilirubin 2.0 (*)    All other components within normal limits  BRAIN NATRIURETIC PEPTIDE - Abnormal; Notable for the following components:   B Natriuretic Peptide 111.1 (*)    All other components within normal limits  TROPONIN I (HIGH SENSITIVITY) - Abnormal; Notable for the following components:   Troponin I (High Sensitivity) 20 (*)    All other components within normal limits  TROPONIN I (HIGH SENSITIVITY) - Abnormal; Notable for the following components:   Troponin I (High Sensitivity) 794 (*)    All other components within normal limits  SARS CORONAVIRUS 2 BY RT PCR (HOSPITAL ORDER, [place] [facility])  CBC WITH DIFFERENTIAL/PLATELET  PROTIME-INR  APTT  HEPARIN LEVEL (UNFRACTIONATED)   ____________________________________________  EKG  ED ECG REPORT I, Hinda Kehr, the attending physician, personally viewed and interpreted this ECG.  Date: 07/07/2019 EKG Time: 05:28 Rate: 172 Rhythm: SVT QRS Axis: normal Intervals: abnormal due to SVT and probable right conduction delay. ST/T Wave abnormalities: Most consistent with rate related ischemia, but difficult to interpret due to abnormal rhythm. Narrative Interpretation: Abnormal EKG, concerning for demand ischemia and tachycardia.  ED ECG REPORT I, Hinda Kehr, the attending physician, personally viewed and interpreted this ECG.  Date: 07/07/2019 EKG Time: 05:45 Rate: 47 Rhythm: Sinus bradycardia QRS Axis: normal Intervals: Left bundle branch block ST/T Wave abnormalities: Non-specific ST segment / T-wave changes, but no clear evidence of acute ischemia. Narrative Interpretation: no definitive evidence of acute ischemia; does not meet STEMI criteria.    ____________________________________________  RADIOLOGY I, Hinda Kehr, personally viewed and evaluated these images (plain radiographs) as part of my medical decision making, as well as reviewing the written report by the radiologist.  ED MD interpretation: No indication for emergent imaging  Official radiology report(s): No results found.  ____________________________________________   PROCEDURES   Procedure(s) performed (including Critical Care):  .1-3 Lead EKG Interpretation Performed by: Hinda Kehr, MD Authorized by: Hinda Kehr, MD     Interpretation: normal     ECG rate:  79   ECG rate assessment: normal     Rhythm: sinus rhythm     Ectopy: aberrant     Conduction: normal   .Critical Care Performed by: Hinda Kehr, MD Authorized by: Hinda Kehr, MD   Critical care provider statement:    Critical  care time  (minutes):  30   Critical care time was exclusive of:  Separately billable procedures and treating other patients   Critical care was necessary to treat or prevent imminent or life-threatening deterioration of the following conditions:  Cardiac failure   Critical care was time spent personally by me on the following activities:  Development of treatment plan with patient or surrogate, discussions with consultants, evaluation of patient's response to treatment, examination of patient, obtaining history from patient or surrogate, ordering and performing treatments and interventions, ordering and review of laboratory studies, ordering and review of radiographic studies, pulse oximetry, re-evaluation of patient's condition and review of old charts     ____________________________________________   INITIAL IMPRESSION / MDM / [place] / ED COURSE  As part of my medical decision making, I reviewed the following data within the electronic MEDICAL RECORD NUMBER History obtained from family, Nursing notes reviewed and incorporated, Labs reviewed , EKG interpreted , Old chart reviewed, Patient signed out to , Discussed with cardiologist (Dr. Saunders Revel), Discussed with radiologist, Notes from prior ED visits and [place] Controlled Substance Database   Differential diagnosis includes, but is not limited to, ACS, SVT, A. fib with RVR, ventricular tachycardia, other nonspecific rhythm, electrolyte or metabolic abnormality, acute infection.  The patient presented appearing quite ill with hypotension in triage with a blood pressure in the 95G systolic over 38V diastolic and a heart rate in the upper 170s. I saw his initial EKG and heard the report and planned to perform synchronized cardioversion for unstable SVT.  However, when I got in the room, the nurses were getting him settled down including starting an IV, and he was at that point and what appeared to be a normal sinus rhythm with a heart rate in the 40s. He  got down as low as 40 or possibly even 39 before his heart rate came back up. He remained hypertensive. However the patient was awake and alert throughout. We hooked him up to a ZOLL with anterior and posterior pads and I continue to watch him. After a few minutes of direct and constant observation, the patient's heart rate continued to climb and reached the 70s which is what he says is normal. Similarly his blood pressure improved to the 564P systolic.  The patient is on the cardiac monitor to evaluate for evidence of arrhythmia and/or significant heart rate changes.  Lab work is pending including troponin, electrolytes, etc. His wife is now at bedside and I was able to discuss all of the symptoms with her as well. They are comfortable with the plan for continued observation including finding out his troponin level. Given the severity of his initial presentation I anticipate admission but it is unclear whether this was primarily an arrhythmia causing the issue or an underlying insult to the cardiac muscle such as an MI.     Clinical Course as of Jul 06 840  Wed Jul 07, 2019  0720 The patient reports that he feels back to normal.  He has had no symptoms since the initial evaluation and resuscitation.  He claims that he no longer had any chest pressure and has felt no palpitations.His lab work is generally reassuring.  He has mild hypomagnesemia for which I have ordered magnesium 2 g IV.  His electrolytes are otherwise essentially normal.  His high-sensitivity troponin is 20 which likely is the results of his episode of SVT which lasted for an unknown period of time.  The patient states that he  does not want to stay in the hospital if he does not have to, but we both agreed that his episode was quite worrisome.  Since he is a patient of Dr. Rockey Situ, I will call the cardiologist covering for the [facility] health medical group and discussed the case to see if he or she recommends admission or would like to see the  patient in the emergency department.   [CF]  [id number] Paged Dr. Saunders Revel to discuss the case.   [CF]  [id number] I spoke by phone with Dr. Saunders Revel with cardiology to discuss the case.  We discussed the case in detail and he agreed with the repeat high-sensitivity troponin which is being drawn shortly.  He or one of his colleagues from the cardiology clinic will come to the ED to evaluate the patient and offer additional recommendations, whether discharge and outpatient follow-up would be appropriate or whether they recommend admission at that time.  The patient remains asymptomatic and understands the plan.  I am transferring ED care to Dr. Joan Mayans touch base with cardiology after their evaluation.   [CF]    Clinical Course User Index [CF] Hinda Kehr, MD     ____________________________________________  FINAL CLINICAL IMPRESSION(S) / ED DIAGNOSES  Final diagnoses:  Chest pressure  Cardiac arrhythmia, unspecified cardiac arrhythmia type     MEDICATIONS GIVEN DURING THIS VISIT:  Medications  atropine 1 MG/10ML injection 0.5 mg (0 mg Intravenous Hold 07/07/19 0607)  heparin bolus via infusion 4,000 Units (has no administration in time range)    Followed by  heparin ADULT infusion 100 units/mL (25000 units/256m sodium chloride 0.45%) (has no administration in time range)  sodium chloride 0.9 % bolus 1,000 mL (0 mLs Intravenous Stopped 07/07/19 0707)  magnesium sulfate IVPB 2 g 50 mL (0 g Intravenous Stopped 07/07/19 02479     ED Discharge Orders    None      *Please note:  BMeshilem Machucawas evaluated in Emergency Department on 07/07/2019 for the symptoms described in the history of present illness. He was evaluated in the context of the global COVID-19 pandemic, which necessitated consideration that the patient might be at risk for infection with the SARS-CoV-2 virus that causes COVID-19. Institutional protocols and algorithms that pertain to the evaluation of patients at risk for COVID-19 are in a  state of rapid change based on information released by regulatory bodies including the CDC and federal and state organizations. These policies and algorithms were followed during the patient's care in the ED.  Some ED evaluations and interventions may be delayed as a result of limited staffing during and after the pandemic.*  Note:  This document was prepared using Dragon voice recognition software and may include unintentional dictation errors.   FHinda Kehr MD 07/07/19 [id number]

## 2019-07-07 NOTE — Progress Notes (Signed)
Pt arrived to unit. Ellyn Hack, MD made aware. Order placed for Pharmacy Consult for Heparin gtt to be restarted.

## 2019-07-07 NOTE — Discharge Summary (Addendum)
Discharge Summary    Patient ID: [name] [name] MRN: [id number]; DOB: Jun 26, 1940  Admit date: 07/07/2019 Discharge date: 07/07/2019  Primary Care Provider: [name], [name]  Primary Cardiologist: Ida Rogue, MD  Primary Electrophysiologist:  None   Discharge Diagnoses    Principal Problem:   NSTEMI (non-ST elevated myocardial infarction) [facility]) Active Problems:   CAD (coronary artery disease)   Essential hypertension   Controlled type 2 diabetes mellitus with complication, without long-term current use of insulin [facility])   Mixed hyperlipidemia  Diagnostic Studies/Procedures    Cardiac Catheterization 6.22.2021  Result Date: 07/07/2019 Conclusions: 1. Severe two-vessel coronary artery disease, with aneurysmal dilation of the proximal and mid LAD and heavy calcification.  There is a focal 80% proximal/mid LAD stenosis at the takeoff of D1, which is hemodynamically significant (iFR = 0.84).  The ostium of D1 also contains a 78-80% stenosis.  The proximal RCA is chronically occluded, with the distal branches filling via left-to-right collaterals (attempted PCI of RCA CTO was unsuccessful in ~2005). 2. Patent stent in ramus intermedius. 3. Grossly normal left ventricular contraction with mildly elevated filling pressure. Recommendations: 1. Case reviewed with Dr. Irish Lack.  We will arrange for transfer to [facility] for atherectomy/PCI of LAD. 2. Obtain transthoracic echocardiogram. 3. Aggressive secondary prevention.  We will load [name] [name] with clopidogrel, with continuation of dual antiplatelet therapy for at least 12 months.  Nelva Bush, MD [facility]  _____________   History of Present Illness     [name] [name] is a 79 y.o. male with a history of CAD status post prior interventions with PTCA in 1991 and subsequent drug-eluting stent placement to the Ramus Intermedius in 2005.  Other history includes hypertension, hyperlipidemia, type 2 diabetes mellitus,  melanoma, and ischemic cardiomyopathy with an EF of 50-55% by echocardiogram in March 2019.  He was doing well at recent cardiology clinic visit in May 2021.  Over the past 2 weeks, he has been experiencing intermittent night sweats.  On the morning of June 23, he awoke at approximately 4 AM noting diaphoresis.  He used the bathroom and upon returning to bed, he did not feel well.  He noted mild discomfort across his maxilla.  He sat up and checked his blood pressure and noted systolics in the 78I.  He checked his blood pressure a few more times on both arms and continued to receive readings in the 80s.  He alerted his wife and presented to the emergency department.  Upon arrival to the ED, he noted chest discomfort in addition to ongoing maxillary pain.  Initial triage ECG showed tachycardia at a rate of 172 bpm felt to represent SVT with aberrancy, though VT could not be excluded.  Following placement of an IV was a plan to use adenosine, his rhythm broke spontaneously and he became bradycardic with rates in the 40s.  Initial blood pressure was 60/49 he was given atropine with improvement in heart rate and rise in blood pressures into the 1 teens.  Chest pain symptoms resolved within 30 minutes upon arrival.  Initial troponin was elevated at 20 with follow-up of 794.  He was placed on heparin therapy with plan for admission and diagnostic catheterization.  Hospital Course     Consultants: None  Patient underwent diagnostic catheterization revealing severe LAD stenosis with a chronic total occlusion of the right coronary artery.  Films reviewed with interventional colleagues at [facility] and [name] [name] will be transferred to [facility] for atherectomy of the LAD.  He has been loaded with clopidogrel therapy.  In the setting of tachycardia on presentation with concern for SVT with aberrancy versus VT, will ask our [facility] based EP team to evaluate.  Did the patient have an acute coronary syndrome  (MI, NSTEMI, STEMI, etc) this admission?:  Yes                               AHA/ACC Clinical Performance & Quality Measures: 1. Aspirin prescribed? - Yes 2. ADP Receptor Inhibitor (Plavix/Clopidogrel, Brilinta/Ticagrelor or Effient/Prasugrel) prescribed (includes medically managed patients)? - Yes 3. Beta Blocker prescribed? - No - Pt w/ bradycardia req atropine in ED. 4. High Intensity Statin (Lipitor 40-80mg  or Crestor 20-40mg ) prescribed? - Yes 5. EF assessed during THIS hospitalization? - Yes 6. For EF <40%, was ACEI/ARB prescribed? - Not Applicable (EF >/= 28%) 7. For EF <40%, Aldosterone Antagonist (Spironolactone or Eplerenone) prescribed? - Not Applicable (EF >/= 36%) 8. Cardiac Rehab Phase II ordered (including medically managed patients)? - No - Tx to [facility] for further eval.   _____________  Discharge Vitals Blood pressure 133/88, pulse 71, temperature 97.7 F (36.5 C), temperature source Oral, resp. rate 14, height 6' (1.829 m), weight 89.4 kg, SpO2 97 %.  Filed Weights   07/07/19 0528 07/07/19 1013  Weight: 89.4 kg 89.4 kg    Labs & Radiologic Studies    CBC Recent Labs    07/07/19 0543  WBC 7.9  NEUTROABS 4.6  HGB 16.3  HCT 46.0  MCV 86.1  PLT 629   Basic Metabolic Panel Recent Labs    07/07/19 0543  NA 139  K 4.0  CL 103  CO2 24  GLUCOSE 194*  BUN 22  CREATININE 0.86  CALCIUM 9.1  MG 1.4*   Liver Function Tests Recent Labs    07/07/19 0543  AST 47*  ALT 41  ALKPHOS 55  BILITOT 2.0*  PROT 7.4  ALBUMIN 4.0   High Sensitivity Troponin:   Recent Labs  Lab 07/07/19 0543 07/07/19 0745  TROPONINIHS 20* 794*  _____________    Disposition   Pt is being transferred to [facility] for further evaluation.  Follow-up Plans & Appointments   To be determined following discharge from [facility].   Discharge Medications   Allergies as of 07/07/2019      Reactions   Metformin And Related Diarrhea   Diarrhea, leg cramps   Azithromycin Other  (See Comments)   Not recommended - no reaction      Medication List    STOP taking these medications   carvedilol 3.125 MG tablet Commonly known as: COREG   doxycycline 100 MG tablet Commonly known as: VIBRA-TABS   fluconazole 150 MG tablet Commonly known as: DIFLUCAN   hydrochlorothiazide 25 MG tablet Commonly known as: HYDRODIURIL   naproxen sodium 220 MG tablet Commonly known as: ALEVE   rosuvastatin 10 MG tablet Commonly known as: CRESTOR     TAKE these medications   acetaminophen 650 MG CR tablet Commonly known as: TYLENOL Take 650 mg by mouth 2 (two) times a day.   aspirin EC 81 MG tablet Take 81 mg by mouth daily.   atorvastatin 80 MG tablet Commonly known as: LIPITOR atorvastatin 80 mg tablet   ezetimibe 10 MG tablet Commonly known as: ZETIA TAKE 1 TABLET(10 MG) BY MOUTH DAILY   FISH OIL PO Take 1 capsule by mouth daily.   fluticasone 50 MCG/ACT nasal spray Commonly known  as: FLONASE Place 2 sprays into both nostrils daily. What changed:   when to take this  reasons to take this   glipiZIDE 5 MG 24 hr tablet Commonly known as: GLUCOTROL XL Take 1 tablet (5 mg total) by mouth 2 (two) times daily. With food   ipratropium 0.06 % nasal spray Commonly known as: ATROVENT Place 2 sprays into both nostrils 3 (three) times daily.   lisinopril 10 MG tablet Commonly known as: ZESTRIL TAKE 1 TABLET BY MOUTH DAILY   multivitamin capsule Take 1 capsule by mouth daily with lunch.   MURINE TEARS FOR DRY EYES OP Place 1-2 drops into both eyes daily as needed (for dry eyes).   Ozempic (0.25 or 0.5 MG/DOSE) 2 MG/1.5ML Sopn Generic drug: Semaglutide(0.25 or 0.5MG /DOS) Inject 1 mg into the skin once a week.   pantoprazole 40 MG tablet Commonly known as: PROTONIX TAKE 1 TABLET BY MOUTH TWICE DAILY   psyllium 58.6 % powder Commonly known as: METAMUCIL Take 1 packet by mouth at bedtime.   tamsulosin 0.4 MG Caps capsule Commonly known as:  FLOMAX TAKE 1 CAPSULE(0.4 MG) BY MOUTH DAILY   traMADol 50 MG tablet Commonly known as: ULTRAM TAKE 1 TABLET BY MOUTH EVERY 12 HOURS AS NEEDED         Outstanding Labs/Studies   Further eval @ [facility] following d/c from [facility].  Duration of Discharge Encounter   Greater than 30 minutes including physician time.  Signed, Murray Hodgkins, NP 07/07/2019, 12:45 PM

## 2019-07-07 NOTE — H&P (Signed)
    [name] [name] was seen and evaluated by Dr. Saunders Revel and Toma Deiters, NP at [facility] earlier today.  Please see full cardiology consult note and discharge summary from that facility.  Patient underwent cardiac catheterization showing occluded RCA and severe calcified lesion in the LAD.  He is being transferred to [facility] to undergo stage atherectomy based PCI of the LAD tomorrow June 24 by Dr. Irish Lack.  Glenetta Hew, MD

## 2019-07-07 NOTE — ED Triage Notes (Signed)
Pt reports awoke PTA with  upper chest "tightness" and diaphoretic with low BP at home

## 2019-07-07 NOTE — Consult Note (Signed)
ANTICOAGULATION CONSULT NOTE - Initial Consult  Pharmacy Consult for Heparin Drip Indication: chest pain/ACS/STEMI  Allergies  Allergen Reactions  . Metformin And Related Diarrhea    Diarrhea, leg cramps  . Azithromycin Other (See Comments)    Not recommended - no reaction    Patient Measurements: Height: 6' (182.9 cm) Weight: 89.4 kg (197 lb) IBW/kg (Calculated) : 77.6 Heparin Dosing Weight: 89.4kg  Vital Signs: Temp: 97.6 F (36.4 C) (06/23 0523) Temp Source: Oral (06/23 0523) BP: 126/94 (06/23 0715) Pulse Rate: 68 (06/23 0715)  Labs: Recent Labs    07/07/19 0543 07/07/19 0639 07/07/19 0745  HGB 16.3  --   --   HCT 46.0  --   --   PLT 193  --   --   LABPROT  --  13.8  --   INR  --  1.1  --   CREATININE 0.86  --   --   TROPONINIHS 20*  --  794*    Estimated Creatinine Clearance: 76.4 mL/min (by C-G formula based on SCr of 0.86 mg/dL).   Medical History: Past Medical History:  Diagnosis Date  . Aortic insufficiency    a. noted on TTE 2015  . Arthritis   . CAD (coronary artery disease)    a. remote PCI in 1991 and 2005; b. MV 3/15: old inferior MI, no ischemia, LVEF 50%, slight inferior wall hypokniesis  . Chicken pox   . Colon polyps    4 pre-cancerous   . Diverticulitis   . DM type 2 (diabetes mellitus, type 2) ([place])   . Family history of adverse reaction to anesthesia   . GERD (gastroesophageal reflux disease)   . Heart murmur   . History of kidney stones   . Hyperlipidemia   . Hypertension   . Kidney stones   . Melanoma ([place]) 1980   Resected from his back  . Mitral regurgitation    a. noted on TTE 2015  . Myocardial infarction ([place])   . Systolic dysfunction    a. TTE 2015: EF  50-55%, mild global hypokinesis, mild to moderate aortic sclerosis, mild aortic insufficiency, mild to moderate mitral regurgitation, mild tricuspid regurgitation, moderately dilated left atrium, mildly dilated right ventricle, PASP 31 mmHg    Medications:  No PTA  Anticoagulation of record noted  Assessment: 79 yo male with chest tightness and elevated troponin.  Pharmacy has been consulted to initiate and monitor a heparin drip.  Goal of Therapy:  Heparin level 0.3-0.7 units/ml Monitor platelets by anticoagulation protocol: Yes   Plan:  Will order baseline APTT (INR and CBC already assessed)  Give 4000 units bolus x 1 Start heparin infusion at 1150 units/hr Check anti-Xa level in 8 hours and daily while on heparin Continue to monitor H&H and platelets  Lu Duffel, PharmD, BCPS Clinical Pharmacist 07/07/2019 8:43 AM

## 2019-07-08 ENCOUNTER — Inpatient Hospital Stay (HOSPITAL_COMMUNITY)
Admission: AD | Disposition: A | Discharge status: Home / Self Care | Payer: Self-pay | Source: Other Acute Inpatient Hospital | Attending: Cardiology

## 2019-07-08 ENCOUNTER — Encounter: Payer: Self-pay | Admitting: Internal Medicine

## 2019-07-08 ENCOUNTER — Ambulatory Visit (HOSPITAL_COMMUNITY)
Admission: RE | Admit: 2019-07-08 | Payer: Medicare Other | Source: Home / Self Care | Admitting: Interventional Cardiology

## 2019-07-08 DIAGNOSIS — I214 Non-ST elevation (NSTEMI) myocardial infarction: Principal | ICD-10-CM

## 2019-07-08 DIAGNOSIS — I2510 Atherosclerotic heart disease of native coronary artery without angina pectoris: Secondary | ICD-10-CM

## 2019-07-08 HISTORY — PX: LEFT HEART CATH: CATH118248

## 2019-07-08 HISTORY — PX: CORONARY STENT INTERVENTION: CATH118234

## 2019-07-08 HISTORY — PX: CORONARY ULTRASOUND/IVUS: CATH118244

## 2019-07-08 HISTORY — PX: CORONARY ATHERECTOMY: CATH118238

## 2019-07-08 HISTORY — PX: CORONARY BALLOON ANGIOPLASTY: CATH118233

## 2019-07-08 LAB — BASIC METABOLIC PANEL
Anion gap: 11 (ref 5–15)
BUN: 13 mg/dL (ref 8–23)
CO2: 22 mmol/L (ref 22–32)
Calcium: 8.4 mg/dL — ABNORMAL LOW (ref 8.9–10.3)
Chloride: 107 mmol/L (ref 98–111)
Creatinine, Ser: 0.64 mg/dL (ref 0.61–1.24)
GFR calc Af Amer: >60 mL/min (ref >60)
GFR calc non Af Amer: >60 mL/min (ref >60)
Glucose, Bld: 113 mg/dL — ABNORMAL HIGH (ref 70–99)
Potassium: 3.7 mmol/L (ref 3.5–5.1)
Sodium: 140 mmol/L (ref 135–145)

## 2019-07-08 LAB — POCT ACTIVATED CLOTTING TIME
Activated Clotting Time: 296 seconds
Activated Clotting Time: 312 seconds
Activated Clotting Time: 323 seconds
Activated Clotting Time: 340 seconds
Activated Clotting Time: 257 seconds

## 2019-07-08 LAB — GLUCOSE, CAPILLARY
Glucose-Capillary: 138 mg/dL — ABNORMAL HIGH (ref 70–99)
Glucose-Capillary: 134 mg/dL — ABNORMAL HIGH (ref 70–99)
Glucose-Capillary: 135 mg/dL — ABNORMAL HIGH (ref 70–99)

## 2019-07-08 LAB — CBC
HCT: 40.8 % (ref 39.0–52.0)
Hemoglobin: 13.7 g/dL (ref 13.0–17.0)
MCH: 29.7 pg (ref 26.0–34.0)
MCHC: 33.6 g/dL (ref 30.0–36.0)
MCV: 88.5 fL (ref 80.0–100.0)
Platelets: 173 10*3/uL (ref 150–400)
RBC: 4.61 MIL/uL (ref 4.22–5.81)
RDW: 14.4 % (ref 11.5–15.5)
WBC: 6.8 10*3/uL (ref 4.0–10.5)
nRBC: 0 % (ref 0.0–0.2)

## 2019-07-08 LAB — HEPARIN LEVEL (UNFRACTIONATED): Heparin Unfractionated: 0.34 IU/mL (ref 0.30–0.70)

## 2019-07-08 SURGERY — CORONARY ATHERECTOMY
Anesthesia: LOCAL

## 2019-07-08 MED ORDER — IOHEXOL 350 MG/ML SOLN
INTRAVENOUS | Status: AC
  Filled 2019-07-08: qty 1

## 2019-07-08 MED ORDER — IOHEXOL 350 MG/ML SOLN
INTRAVENOUS | Status: DC | PRN
  Administered 2019-07-08: 115 mL via INTRA_ARTERIAL

## 2019-07-08 MED ORDER — FENTANYL CITRATE (PF) 100 MCG/2ML IJ SOLN
INTRAMUSCULAR | Status: AC
  Filled 2019-07-08: qty 2

## 2019-07-08 MED ORDER — NITROGLYCERIN 1 MG/10 ML FOR IR/CATH LAB
INTRA_ARTERIAL | Status: DC | PRN
  Administered 2019-07-08: 200 ug via INTRA_ARTERIAL
  Administered 2019-07-08: 400 ug via INTRA_ARTERIAL
  Administered 2019-07-08: 200 ug via INTRACORONARY

## 2019-07-08 MED ORDER — HEPARIN (PORCINE) IN NACL 1000-0.9 UT/500ML-% IV SOLN
INTRAVENOUS | Status: AC
  Filled 2019-07-08: qty 500

## 2019-07-08 MED ORDER — VIPERSLIDE LUBRICANT OPTIME: TOPICAL | Status: DC | PRN

## 2019-07-08 MED ORDER — VERAPAMIL HCL 2.5 MG/ML IV SOLN
INTRAVENOUS | Status: AC
  Filled 2019-07-08: qty 2

## 2019-07-08 MED ORDER — SODIUM CHLORIDE 0.9 % IV SOLN: INTRAVENOUS | Status: AC

## 2019-07-08 MED ORDER — LIDOCAINE HCL (PF) 1 % IJ SOLN
INTRAMUSCULAR | Status: AC
  Filled 2019-07-08: qty 30

## 2019-07-08 MED ORDER — VERAPAMIL HCL 2.5 MG/ML IV SOLN
INTRAVENOUS | Status: DC | PRN
  Administered 2019-07-08: 10 mL via INTRA_ARTERIAL

## 2019-07-08 MED ORDER — HEPARIN SODIUM (PORCINE) 1000 UNIT/ML IJ SOLN
INTRAMUSCULAR | Status: AC
  Filled 2019-07-08: qty 1

## 2019-07-08 MED ORDER — SODIUM CHLORIDE 0.9 % IV SOLN: 250.0000 mL | INTRAVENOUS | Status: DC | PRN

## 2019-07-08 MED ORDER — CLOPIDOGREL BISULFATE 75 MG PO TABS: 75.0000 mg | ORAL_TABLET | Freq: Every day | ORAL | Status: DC

## 2019-07-08 MED ORDER — HEPARIN SODIUM (PORCINE) 1000 UNIT/ML IJ SOLN
INTRAMUSCULAR | Status: DC | PRN
  Administered 2019-07-08: 2000 [IU] via INTRAVENOUS
  Administered 2019-07-08: 9000 [IU] via INTRAVENOUS
  Administered 2019-07-08: 3000 [IU] via INTRAVENOUS

## 2019-07-08 MED ORDER — FENTANYL CITRATE (PF) 100 MCG/2ML IJ SOLN
INTRAMUSCULAR | Status: DC | PRN
  Administered 2019-07-08 (×4): 25 ug via INTRAVENOUS

## 2019-07-08 MED ORDER — LABETALOL HCL 5 MG/ML IV SOLN: 10.0000 mg | INTRAVENOUS | Status: AC | PRN

## 2019-07-08 MED ORDER — HEPARIN (PORCINE) IN NACL 1000-0.9 UT/500ML-% IV SOLN
INTRAVENOUS | Status: DC | PRN
  Administered 2019-07-08 (×2): 500 mL

## 2019-07-08 MED ORDER — SODIUM CHLORIDE 0.9% FLUSH
3.0000 mL | Freq: Two times a day (BID) | INTRAVENOUS | Status: DC
  Administered 2019-07-09: 3 mL via INTRAVENOUS

## 2019-07-08 MED ORDER — NITROGLYCERIN 1 MG/10 ML FOR IR/CATH LAB
INTRA_ARTERIAL | Status: AC
  Filled 2019-07-08: qty 10

## 2019-07-08 MED ORDER — LIDOCAINE HCL (PF) 1 % IJ SOLN
INTRAMUSCULAR | Status: DC | PRN
  Administered 2019-07-08: 2 mL via INTRADERMAL

## 2019-07-08 MED ORDER — MIDAZOLAM HCL 2 MG/2ML IJ SOLN
INTRAMUSCULAR | Status: AC
  Filled 2019-07-08: qty 2

## 2019-07-08 MED ORDER — SODIUM CHLORIDE 0.9% FLUSH: 3.0000 mL | INTRAVENOUS | Status: DC | PRN

## 2019-07-08 MED ORDER — HYDRALAZINE HCL 20 MG/ML IJ SOLN: 10.0000 mg | INTRAMUSCULAR | Status: AC | PRN

## 2019-07-08 MED ORDER — ACETAMINOPHEN 325 MG PO TABS: 650.0000 mg | ORAL_TABLET | ORAL | Status: DC | PRN

## 2019-07-08 MED ORDER — ONDANSETRON HCL 4 MG/2ML IJ SOLN: 4.0000 mg | Freq: Four times a day (QID) | INTRAMUSCULAR | Status: DC | PRN

## 2019-07-08 MED ORDER — MIDAZOLAM HCL 2 MG/2ML IJ SOLN
INTRAMUSCULAR | Status: DC | PRN
  Administered 2019-07-08 (×2): 1 mg via INTRAVENOUS
  Administered 2019-07-08: 2 mg via INTRAVENOUS

## 2019-07-08 MED ORDER — ASPIRIN 81 MG PO CHEW
81.0000 mg | CHEWABLE_TABLET | Freq: Every day | ORAL | Status: DC
  Administered 2019-07-09: 81 mg via ORAL
  Filled 2019-07-08: qty 1

## 2019-07-08 SURGICAL SUPPLY — 32 items
BALLN  ~~LOC~~ SAPPHIRE 4.5X12 (BALLOONS) ×1
BALLN SAPPHIRE 3.5X15 (BALLOONS) ×2
BALLN SAPPHIRE ~~LOC~~ 3.5X12 (BALLOONS) ×2 IMPLANT
BALLN WOLVERINE 2.25X10 (BALLOONS) ×2
BALLN WOLVERINE 3.00X10 (BALLOONS) ×2
BALLN ~~LOC~~ SAPPHIRE 4.5X12 (BALLOONS) ×1
BALLOON SAPPHIRE 3.5X15 (BALLOONS) ×1 IMPLANT
BALLOON WOLVERINE 2.25X10 (BALLOONS) ×1 IMPLANT
BALLOON WOLVERINE 3.00X10 (BALLOONS) ×1 IMPLANT
BALLOON ~~LOC~~ SAPPHIRE 4.5X12 (BALLOONS) ×1 IMPLANT
CATH INFINITI JR4 5F (CATHETERS) ×2 IMPLANT
CATH LAUNCHER 6FR EBU 3.75 (CATHETERS) ×2 IMPLANT
CATH OPTICROSS 40MHZ (CATHETERS) ×2 IMPLANT
CATH TELEPORT (CATHETERS) ×2 IMPLANT
CROWN DIAMONDBACK CLASSIC 1.25 (BURR) ×2 IMPLANT
DEVICE RAD COMP TR BAND LRG (VASCULAR PRODUCTS) ×2 IMPLANT
GLIDESHEATH SLEND SS 6F .021 (SHEATH) ×2 IMPLANT
GUIDEWIRE INQWIRE 1.5J.035X260 (WIRE) ×1 IMPLANT
INQWIRE 1.5J .035X260CM (WIRE) ×2
KIT ENCORE 26 ADVANTAGE (KITS) ×2 IMPLANT
KIT HEART LEFT (KITS) ×2 IMPLANT
KIT HEMO VALVE WATCHDOG (MISCELLANEOUS) ×2 IMPLANT
LUBRICANT VIPERSLIDE CORONARY (MISCELLANEOUS) ×2 IMPLANT
PACK CARDIAC CATHETERIZATION (CUSTOM PROCEDURE TRAY) ×2 IMPLANT
SHEATH PROBE COVER 6X72 (BAG) ×2 IMPLANT
SLED PULL BACK IVUS (MISCELLANEOUS) ×2 IMPLANT
STENT RESOLUTE ONYX 4.5X18 (Permanent Stent) ×2 IMPLANT
TRANSDUCER W/STOPCOCK (MISCELLANEOUS) ×2 IMPLANT
TUBING CIL FLEX 10 FLL-RA (TUBING) ×2 IMPLANT
WIRE ASAHI PROWATER 180CM (WIRE) ×2 IMPLANT
WIRE HI TORQ BMW 190CM (WIRE) ×2 IMPLANT
WIRE VIPERWIRE COR FLEX .012 (WIRE) ×2 IMPLANT

## 2019-07-08 NOTE — Progress Notes (Signed)
ANTICOAGULATION CONSULT NOTE - Follow Up Consult  Pharmacy Consult for heparin Indication: CAD awaiting staged PCI  Labs: Recent Labs    07/07/19 0543 07/07/19 0639 07/07/19 0745 07/07/19 0940 07/08/19 0421  HGB 16.3  --   --   --  13.7  HCT 46.0  --   --   --  40.8  PLT 193  --   --   --  173  APTT  --   --   --  30  --   LABPROT  --  13.8  --   --   --   INR  --  1.1  --   --   --   HEPARINUNFRC  --   --   --   --  0.34  CREATININE 0.86  --   --   --   --   TROPONINIHS 20*  --  794*  --   --     Assessment/Plan:  79yo male therapeutic on heparin after resumed post-cath/transfer. Will continue gtt at current rate and f/u after PCI.    Wynona Neat, PharmD, BCPS  07/08/2019,6:17 AM

## 2019-07-08 NOTE — Progress Notes (Addendum)
Progress Note  Patient Name: [name] [name] Date of Encounter: 07/08/2019  Primary Cardiologist: Ida Rogue, MD  Subjective   No recurrent chest pain symptoms today. PCI today  Inpatient Medications    Scheduled Meds: . [START ON 07/09/2019] aspirin  81 mg Oral Daily  . clopidogrel  75 mg Oral Q breakfast  . ezetimibe  10 mg Oral QHS  . insulin aspart  0-15 Units Subcutaneous TID WC  . lisinopril  10 mg Oral Daily  . multivitamin with minerals  1 tablet Oral Daily  . pantoprazole  40 mg Oral QAC breakfast  . rosuvastatin  40 mg Oral QHS  . sodium chloride flush  3 mL Intravenous Q12H  . sodium chloride flush  3 mL Intravenous Q12H  . tamsulosin  0.4 mg Oral Q lunch  . traMADol  50 mg Oral Q12H   Continuous Infusions: . sodium chloride 100 mL/hr at 07/08/19 1110  . sodium chloride     PRN Meds: sodium chloride, acetaminophen, fluticasone, hydrALAZINE, labetalol, nitroGLYCERIN, ondansetron (ZOFRAN) IV, sodium chloride, sodium chloride flush, zolpidem   Vital Signs    Vitals:   07/08/19 1200 07/08/19 1215 07/08/19 1245 07/08/19 1345  BP: (!) 148/70 137/78 (!) 142/93 (!) 109/91  Pulse: (!) 51     Resp: (!) 25 18 18    Temp:      TempSrc:      SpO2: 96% 97% 96%   Weight:      Height:        Intake/Output Summary (Last 24 hours) at 07/08/2019 1458 Last data filed at 07/08/2019 1349 Gross per 24 hour  Intake 1340.55 ml  Output 2300 ml  Net -959.45 ml   Filed Weights   07/07/19 1725 07/08/19 0633  Weight: 93.4 kg 91.7 kg    Physical Exam   General: Well developed, well nourished, NAD Neck: Negative for carotid bruits. No JVD Lungs:Clear to ausculation bilaterally. No wheezes, rales, or rhonchi. Breathing is unlabored. Cardiovascular: RRR with S1 S2. No murmurs Extremities: No edema. Radial pulses 2+ bilaterally Neuro: Alert and oriented. No focal deficits. No facial asymmetry. MAE spontaneously. Psych: Responds to questions appropriately with  normal affect.    Labs    Chemistry Recent Labs  Lab 07/07/19 0543 07/08/19 0421  NA 139 140  K 4.0 3.7  CL 103 107  CO2 24 22  GLUCOSE 194* 113*  BUN 22 13  CREATININE 0.86 0.64  CALCIUM 9.1 8.4*  PROT 7.4  --   ALBUMIN 4.0  --   AST 47*  --   ALT 41  --   ALKPHOS 55  --   BILITOT 2.0*  --   GFRNONAA >60 >60  GFRAA >60 >60  ANIONGAP 12 11     Hematology Recent Labs  Lab 07/07/19 0543 07/08/19 0421  WBC 7.9 6.8  RBC 5.34 4.61  HGB 16.3 13.7  HCT 46.0 40.8  MCV 86.1 88.5  MCH 30.5 29.7  MCHC 35.4 33.6  RDW 14.3 14.4  PLT 193 173    Cardiac EnzymesNo results for input(s): TROPONINI in the last 168 hours. No results for input(s): TROPIPOC in the last 168 hours.   BNP Recent Labs  Lab 07/07/19 0543  BNP 111.1*     DDimer No results for input(s): DDIMER in the last 168 hours.   Radiology    CARDIAC CATHETERIZATION  Addendum Date: 07/08/2019    Prox LAD to Mid LAD lesion is 80% stenosed.  Following atherectomy and multiple high-pressure  balloon inflations, A drug-eluting stent was successfully placed using a STENT RESOLUTE ONYX 4.5X18, postdilated to 4.77 mm and optimized with intravascular ultrasound.  Post intervention, there is a 0% residual stenosis.  1st Diag lesion is 75% stenosed.  Scoring balloon angioplasty was performed using a [place].  Post intervention, there is a 20% residual stenosis.  Mid LAD to Dist LAD lesion is 30% stenosed.  Prox RCA to Dist RCA lesion is 100% stenosed. Known from prior catheterization. Left to right collaterals visible.  Patent stent in the ramus.  LV end diastolic pressure is normal.  There is mild aortic valve stenosis.  Continue aggressive secondary prevention and dual antiplatelet therapy.  After 12 months, would consider clopidogrel monotherapy given the ectasia and extent of calcification of his vessels.   Result Date: 07/08/2019  Prox LAD to Mid LAD lesion is 80% stenosed.  Following  atherectomy and multiple high-pressure balloon inflations, A drug-eluting stent was successfully placed using a STENT RESOLUTE ONYX 4.5X18, postdilated to 4.77 mm and optimized with intravascular ultrasound.  Post intervention, there is a 0% residual stenosis.  1st Diag lesion is 75% stenosed.  Scoring balloon angioplasty was performed using a [place].  Post intervention, there is a 20% residual stenosis.  Mid LAD to Dist LAD lesion is 30% stenosed.  Prox RCA to Dist RCA lesion is 100% stenosed. Known from prior catheterization. Left to right collaterals visible.  Patent stent in the ramus.  LV end diastolic pressure is normal.  There is mild aortic valve stenosis.  Continue aggressive secondary prevention and dual antiplatelet therapy.  After 12 months, would consider clopidogrel monotherapy given the ectasia and extent of calcification of his vessels.   CARDIAC CATHETERIZATION  Result Date: 07/07/2019 Conclusions: 1. Severe two-vessel coronary artery disease, with aneurysmal dilation of the proximal and mid LAD and heavy calcification.  There is a focal 80% proximal/mid LAD stenosis at the takeoff of D1, which is hemodynamically significant (iFR = 0.84).  The ostium of D1 also contains a 78-80% stenosis.  The proximal RCA is chronically occluded, with the distal branches filling via left-to-right collaterals (attempted PCI of RCA CTO was unsuccessful in ~2005). 2. Patent stent in ramus intermedius. 3. Grossly normal left ventricular contraction with mildly elevated filling pressure. Recommendations: 1. Case reviewed with Dr. Irish Lack.  We will arrange for transfer to [facility] for atherectomy/PCI of LAD. 2. Obtain transthoracic echocardiogram. 3. Aggressive secondary prevention.  We will load Mr. Hettich with clopidogrel, with continuation of dual antiplatelet therapy for at least 12 months. Nelva Bush, MD [facility]   ECHOCARDIOGRAM COMPLETE  Result Date: 07/07/2019     ECHOCARDIOGRAM REPORT   Patient Name:   [name] [name] Date of Exam: 07/07/2019 Medical Rec #:  [id number]         Height:       72.0 in Accession #:    [id number]        Weight:       197.0 lb Date of Birth:  05-May-1940          BSA:          2.117 m Patient Age:    79 years          BP:           129/92 mmHg Patient Gender: M                 HR:           60 bpm.  Exam Location:  [facility] Procedure: 2D Echo, Cardiac Doppler and Color Doppler Indications:     Chest pain 786.50  History:         Patient has prior history of Echocardiogram examinations, most                  recent 03/25/2017. CAD and Previous Myocardial Infarction; Risk                  Factors:Diabetes and Hypertension. Aortic insufficiency.  Sonographer:     Sherrie Sport RDCS (AE) Referring Phys:  [place] Diagnosing Phys: Harrell Gave End MD  Sonographer Comments: Technically challenging study due to limited acoustic windows, no parasternal window and no subcostal window. Pt kept supine in cath recovery. IMPRESSIONS  1. Left ventricular ejection fraction, by estimation, is 45 to 50%. The left ventricle has mildly decreased function. The left ventricle demonstrates regional wall motion abnormalities (see scoring diagram/findings for description). Left ventricular diastolic parameters are consistent with Grade I diastolic dysfunction (impaired relaxation). There is akinesis of the left ventricular, basal inferior wall.  2. Right ventricular systolic function was not well visualized. The right ventricular size is not well visualized.  3. The mitral valve is grossly normal. Trivial mitral valve regurgitation.  4. Tricuspid valve regurgitation not well assessed.  5. The aortic valve was not well visualized. Aortic valve regurgitation is not visualized. No aortic stenosis is present.  6. Pulmonic valve regurgitation not assessed. FINDINGS  Left Ventricle: Left ventricular dimensions not well assessed. Left ventricular ejection fraction, by  estimation, is 45 to 50%. The left ventricle has mildly decreased function. The left ventricle demonstrates regional wall motion abnormalities. Left ventricular diastolic parameters are consistent with Grade I diastolic dysfunction (impaired relaxation). Right Ventricle: The right ventricular size is not well visualized. Right vetricular wall thickness was not assessed. Right ventricular systolic function was not well visualized. Left Atrium: Left atrial size was normal in size. Right Atrium: Right atrial size was normal in size. Pericardium: There is no evidence of pericardial effusion. Mitral Valve: The mitral valve is grossly normal. Trivial mitral valve regurgitation. Tricuspid Valve: The tricuspid valve is not well visualized. Tricuspid valve regurgitation not well assessed. Aortic Valve: The aortic valve was not well visualized. Aortic valve regurgitation is not visualized. No aortic stenosis is present. Aortic valve mean gradient measures 4.0 mmHg. Aortic valve peak gradient measures 6.9 mmHg. Aortic valve area, by VTI measures 1.64 cm. Pulmonic Valve: The pulmonic valve was not well visualized. Pulmonic valve regurgitation not assessed. Aorta: The aortic root was not well visualized. Pulmonary Artery: The pulmonary artery is not well seen. Venous: The inferior vena cava was not well visualized. IAS/Shunts: The interatrial septum was not well visualized.  LEFT VENTRICLE PLAX 2D LVIDd:         4.24 cm      Diastology LVIDs:         3.14 cm      LV e' lateral:   4.35 cm/s LV PW:         1.48 cm      LV E/e' lateral: 10.8 LV IVS:        1.12 cm      LV e' medial:    4.57 cm/s LVOT diam:     2.10 cm      LV E/e' medial:  10.3 LV SV:         50 LV SV Index:   23 LVOT Area:  3.46 cm  LV Volumes (MOD) LV vol d, MOD A2C: 144.0 ml LV vol s, MOD A2C: 67.4 ml LV vol s, MOD A4C: 63.4 ml LV SV MOD A2C:     76.6 ml RIGHT VENTRICLE RV S prime:     11.70 cm/s TAPSE (M-mode): 3.9 cm LEFT ATRIUM             Index        RIGHT ATRIUM           Index LA diam:        3.40 cm 1.61 cm/m  RA Area:     16.00 cm LA Vol (A2C):   45.1 ml 21.30 ml/m RA Volume:   39.80 ml  18.80 ml/m LA Vol (A4C):   24.0 ml 11.34 ml/m LA Biplane Vol: 35.6 ml 16.82 ml/m  AORTIC VALVE AV Area (Vmax):    1.60 cm AV Area (Vmean):   1.49 cm AV Area (VTI):     1.64 cm AV Vmax:           131.13 cm/s AV Vmean:          87.600 cm/s AV VTI:            0.303 m AV Peak Grad:      6.9 mmHg AV Mean Grad:      4.0 mmHg LVOT Vmax:         60.70 cm/s LVOT Vmean:        37.800 cm/s LVOT VTI:          0.143 m LVOT/AV VTI ratio: 0.47 MITRAL VALVE               TRICUSPID VALVE MV Area (PHT): 2.00 cm    TR Peak grad:   16.2 mmHg MV Decel Time: 379 msec    TR Vmax:        201.00 cm/s MV E velocity: 47.10 cm/s MV A velocity: 99.00 cm/s  SHUNTS MV E/A ratio:  0.48        Systemic VTI:  0.14 m                            Systemic Diam: 2.10 cm Nelva Bush MD Electronically signed by Nelva Bush MD Signature Date/Time: 07/07/2019/1:33:31 PM    Final    Telemetry    07/08/19 SB with rates in the 40-50bpm range Personally Reviewed  ECG    No new tracing as of 07/08/19- Personally Reviewed  Cardiac Studies   LHC 07/07/19:  Conclusions: 1. Severe two-vessel coronary artery disease, with aneurysmal dilation of the proximal and mid LAD and heavy calcification.  There is a focal 80% proximal/mid LAD stenosis at the takeoff of D1, which is hemodynamically significant (iFR = 0.84).  The ostium of D1 also contains a 78-80% stenosis.  The proximal RCA is chronically occluded, with the distal branches filling via left-to-right collaterals (attempted PCI of RCA CTO was unsuccessful in ~2005). 2. Patent stent in ramus intermedius. 3. Grossly normal left ventricular contraction with mildly elevated filling pressure.  Recommendations: 1. Case reviewed with Dr. Irish Lack.  We will arrange for transfer to [facility] for atherectomy/PCI of LAD. 2. Obtain transthoracic  echocardiogram. 3. Aggressive secondary prevention.  We will load Mr. Smolinsky with clopidogrel, with continuation of dual antiplatelet therapy for at least 12 months.  Echocardiogram 6//23/21:  1. Left ventricular ejection fraction, by estimation, is 45 to 50%. The  left ventricle has mildly  decreased function. The left ventricle  demonstrates regional wall motion abnormalities (see scoring  diagram/findings for description). Left ventricular  diastolic parameters are consistent with Grade I diastolic dysfunction  (impaired relaxation). There is akinesis of the left ventricular, basal  inferior wall.  2. Right ventricular systolic function was not well visualized. The right  ventricular size is not well visualized.  3. The mitral valve is grossly normal. Trivial mitral valve  regurgitation.  4. Tricuspid valve regurgitation not well assessed.  5. The aortic valve was not well visualized. Aortic valve regurgitation  is not visualized. No aortic stenosis is present.  6. Pulmonic valve regurgitation not assessed.   Patient Profile     79 y.o. male with a history of CAD status post prior percutaneous interventions, hypertension, hyperlipidemia, type 2 diabetes mellitus, and ischemic cardiomyopathy, who is being seen today for the evaluation ofnon-STEMI.  Assessment & Plan    1. NSTEMI: -Presented with chest pain which resolved 79min after ED arrival>>hx of PTCA of an unknown vessel 1991 followed by drug-eluting stent placement in 2005. Most recent ischemic evaluation 2015 showed prior inferior infarct without evidence of ischemia.  -EKG with anterolateral ST depression with follow-up EKG with sinus bradycardia and no significant ST or T changes.  -Initial troponin was 20 with a follow-up high-sensitivity troponin of 794.  -Underwent cath revealing severe LAD dzs w/ CTO of the RCA with plans to tx to [facility] for stent intervention  -No chest pain today   2. Essential hypertension:   -Stable, 165/91>151/93>>148/95 -Hold beta-blocker in the setting of bradycardia -Continue home dose of ACE inhibitor   3. Hyperlipidemia:  -Last LDL, 55 01/2019 -Continue statin and Zetia therapy   4. SVT with aberrancy vs VT:  -Noted on triage EKG and with spontaneous break>>>to SB -HR's remain in the 40-50bpm range -Needs EP eval post PCI in the setting of sinus bradycardia,   5. Sinus bradycardia:  -Rates are in the 40s following conversion from tachycardia in the ED.  -He required atropine and rates have stabilized in the 50s to 60s.  -Hold beta-blocker as above.   6. DM2:  -SSI for glucose control while inpatient status -On PTA once weekly home insulin    Signed, Kathyrn Drown NP-C [facility] Pager: [phone] 07/08/2019, 8:18 AM     ATTENDING ATTESTATION  I have seen, examined and evaluated the patient this PM after PCI.  After reviewing all the available data and chart, we discussed the patients laboratory, study & physical findings as well as symptoms in detail. I agree with the findings noted above by Kathyrn Drown, NP, examination as well as impression recommendations as per our discussion.    He was seen following successful PCI of the LAD.  I was in the Cath Lab and saw the films personally.  Excellent result with large stent in the LAD with brisk downstream flow. He does have some soreness in the right radial cath site having just had a small hematoma treated with manual compression.  There is no significant ecchymosis noted is a little sore but no longer swollen.  Heart rates are relatively low here as noted.  Avoiding beta-blockers. Plan will be to monitor overnight and discharge tomorrow.  Continue other medications.  Did not qualify for Aegis trial because of him already being on a trial for Covid.   Glenetta Hew, M.D., M.S. Interventional Cardiologist   Pager # [phone] Phone # [phone] [address] [place], [place]  [address]     For  questions or updates, please contact   Please consult [email or web address] for contact info under Cardiology/STEMI.

## 2019-07-08 NOTE — Interval H&P Note (Signed)
Cath Lab Visit (complete for each Cath Lab visit)  Clinical Evaluation Leading to the Procedure:   ACS: Yes.    Non-ACS:    Anginal Classification: CCS IV  Anti-ischemic medical therapy: Minimal Therapy (1 class of medications)  Non-Invasive Test Results: No non-invasive testing performed  Prior CABG: No previous CABG   Planned atherectomy due to heavy calcification.   History and Physical Interval Note:  07/08/2019 8:36 AM  [name]  has presented today for surgery, with the diagnosis of CAD.  The various methods of treatment have been discussed with the patient and family. After consideration of risks, benefits and other options for treatment, the patient has consented to  Procedure(s): CORONARY ATHERECTOMY (N/A) as a surgical intervention.  The patient's history has been reviewed, patient examined, no change in status, stable for surgery.  I have reviewed the patient's chart and labs.  Questions were answered to the patient's satisfaction.     [name] [name]

## 2019-07-09 ENCOUNTER — Telehealth: Payer: Self-pay | Admitting: Cardiovascular Disease

## 2019-07-09 ENCOUNTER — Encounter (HOSPITAL_COMMUNITY): Payer: Self-pay | Admitting: Interventional Cardiology

## 2019-07-09 DIAGNOSIS — I25110 Atherosclerotic heart disease of native coronary artery with unstable angina pectoris: Secondary | ICD-10-CM

## 2019-07-09 DIAGNOSIS — E118 Type 2 diabetes mellitus with unspecified complications: Secondary | ICD-10-CM

## 2019-07-09 DIAGNOSIS — E782 Mixed hyperlipidemia: Secondary | ICD-10-CM

## 2019-07-09 DIAGNOSIS — Z955 Presence of coronary angioplasty implant and graft: Secondary | ICD-10-CM

## 2019-07-09 DIAGNOSIS — I471 Supraventricular tachycardia: Secondary | ICD-10-CM

## 2019-07-09 DIAGNOSIS — I472 Ventricular tachycardia, unspecified: Secondary | ICD-10-CM

## 2019-07-09 DIAGNOSIS — I10 Essential (primary) hypertension: Secondary | ICD-10-CM

## 2019-07-09 LAB — BASIC METABOLIC PANEL
Anion gap: 9 (ref 5–15)
BUN: 10 mg/dL (ref 8–23)
CO2: 23 mmol/L (ref 22–32)
Calcium: 8.5 mg/dL — ABNORMAL LOW (ref 8.9–10.3)
Chloride: 105 mmol/L (ref 98–111)
Creatinine, Ser: 0.64 mg/dL (ref 0.61–1.24)
GFR calc Af Amer: >60 mL/min (ref >60)
GFR calc non Af Amer: >60 mL/min (ref >60)
Glucose, Bld: 123 mg/dL — ABNORMAL HIGH (ref 70–99)
Potassium: 4.4 mmol/L (ref 3.5–5.1)
Sodium: 137 mmol/L (ref 135–145)

## 2019-07-09 LAB — GLUCOSE, CAPILLARY
Glucose-Capillary: 151 mg/dL — ABNORMAL HIGH (ref 70–99)
Glucose-Capillary: 106 mg/dL — ABNORMAL HIGH (ref 70–99)

## 2019-07-09 LAB — CBC
HCT: 42.8 % (ref 39.0–52.0)
Hemoglobin: 14.6 g/dL (ref 13.0–17.0)
MCH: 30.2 pg (ref 26.0–34.0)
MCHC: 34.1 g/dL (ref 30.0–36.0)
MCV: 88.4 fL (ref 80.0–100.0)
Platelets: 173 10*3/uL (ref 150–400)
RBC: 4.84 MIL/uL (ref 4.22–5.81)
RDW: 14.3 % (ref 11.5–15.5)
WBC: 7.3 10*3/uL (ref 4.0–10.5)
nRBC: 0 % (ref 0.0–0.2)

## 2019-07-09 MED ORDER — CARVEDILOL 3.125 MG PO TABS: 3.1250 mg | ORAL_TABLET | Freq: Two times a day (BID) | ORAL | Status: DC

## 2019-07-09 MED ORDER — ROSUVASTATIN CALCIUM 40 MG PO TABS: 40.0000 mg | ORAL_TABLET | Freq: Every day | ORAL | 2 refills | Status: DC

## 2019-07-09 MED ORDER — NITROGLYCERIN 0.4 MG SL SUBL: 0.4000 mg | SUBLINGUAL_TABLET | SUBLINGUAL | 0 refills | Status: DC | PRN

## 2019-07-09 MED ORDER — CLOPIDOGREL BISULFATE 75 MG PO TABS: 75.0000 mg | ORAL_TABLET | Freq: Every day | ORAL | 2 refills | Status: DC

## 2019-07-09 MED FILL — Heparin Sod (Porcine)-NaCl IV Soln 1000 Unit/500ML-0.9%: INTRAVENOUS | Qty: 500 | Status: AC

## 2019-07-09 MED FILL — ROSUVASTATIN CALCIUM 40 MG: 40 | 60 days supply | Qty: 60 | Fill #0

## 2019-07-09 MED FILL — NITROGLYCERIN 0.4 MG TAB SL: 0.4 | 8 days supply | Qty: 25 | Fill #0

## 2019-07-09 MED FILL — CLOPIDOGREL 75 MG TABLET: 75 | 60 days supply | Qty: 60 | Fill #0

## 2019-07-09 NOTE — Consult Note (Addendum)
ELECTROPHYSIOLOGY CONSULT NOTE    Patient ID: [name] [name] MRN: [id number], DOB/AGE: [birth year] 79 y.o.  Admit date: 07/07/2019 Date of Consult: 07/09/2019  Primary Physician: [name], [name] Primary Cardiologist: Ida Rogue, MD  Electrophysiologist: New   Referring Provider: Dr. Ellyn Hack  Patient Profile: [name] [name] is a 79 y.o. male with a history of HTN, HLD, Sinus bradycardia, DM2 and known CAD with NSTEMI and angioplasty this admission who is being seen today for the evaluation of SVT at the request of Dr. Ellyn Hack.  HPI:  [name] [name] is a 79 y.o. male with medical history as above. He presented to [facility] with chest pressure that awoke him from his sleep associated with nausea and diaphoresis. He presented to the ED in apparent SVT with abberancy that terminated spontaneously to sinus bradycardia requiring atropine.   Labs on admission included HS-TnI 20 -> 794, BNP 111, K 4.0, Mg 1.4, creatinine 0.9, HGB 16.3, and PLT 193.  Placed on heparin for ACS and taken for cath which showed severe 2-vessel CAD with aneurysmal dilation of the proximal and mid LAD and heavy calcification. PT noted to have a 80% LAD lesion, chronically occluded RCA with L-to-R collaterals. Patent stent noted in ramus intermedius. Transferred to [facility] for atherectomy/PCI of LAD.   Echo showed LVEF 45-50%  Pt underwent cath 07/08/2019 with atherectomy and balloon angioplasty to LAD followed by DES. PT also had balloon angioplasty to 1st diagonal.   EP team asked to see concerning his SVT and recommends for BB therapy in the setting of his post conversion bradycardia requiring atropine.   He is feeling great currently and would like to go home. He states when his symptoms started he noted HRs in the upper 30/lower 40s at home and systolic BP in the 40J by his cuff at home. He denies any tachy-palpitations. No syncope. He denies near syncope, states the last time he had near syncope was  10-15 years ago in the setting of his first MI. He walks a mile most days at baseline and is very active. Pt was on coreg 3.125 mg BID PTA  Past Medical History:  Diagnosis Date  . Aortic insufficiency    a. noted on TTE 2015  . Arthritis   . CAD (coronary artery disease)    a. remote PCI in 1991 and 2005; b. MV 3/15: old inferior MI, no ischemia, LVEF 50%, slight inferior wall hypokniesis  . Chicken pox   . Colon polyps    4 pre-cancerous   . Diverticulitis   . DM type 2 (diabetes mellitus, type 2) ([place])   . Family history of adverse reaction to anesthesia   . GERD (gastroesophageal reflux disease)   . Heart murmur   . History of kidney stones   . HOH (hard of hearing)   . Hyperlipidemia   . Hypertension   . Kidney stones   . Melanoma ([place]) 1980   Resected from his back  . Mitral regurgitation    a. noted on TTE 2015  . Myocardial infarction ([place])   . Systolic dysfunction    a. TTE 2015: EF  50-55%, mild global hypokinesis, mild to moderate aortic sclerosis, mild aortic insufficiency, mild to moderate mitral regurgitation, mild tricuspid regurgitation, moderately dilated left atrium, mildly dilated right ventricle, PASP 31 mmHg     Surgical History:  Past Surgical History:  Procedure Laterality Date  . CHOLECYSTECTOMY  2012  . COLONOSCOPY     in 2003 with polyp removed and leak anastomosis  had to have open abdominal surgery   . CORONARY ANGIOPLASTY WITH STENT PLACEMENT  1991 & 2005  . CORONARY ATHERECTOMY N/A 07/08/2019   Procedure: CORONARY ATHERECTOMY;  Surgeon: Jettie Booze, MD;  Location: [place] [facility];  Service: Cardiovascular;  Laterality: N/A;  . CORONARY BALLOON ANGIOPLASTY N/A 07/08/2019   Procedure: CORONARY BALLOON ANGIOPLASTY;  Surgeon: Jettie Booze, MD;  Location: [place] [facility];  Service: Cardiovascular;  Laterality: N/A;  diagonal   . CORONARY STENT INTERVENTION N/A 07/08/2019   Procedure: CORONARY STENT INTERVENTION;  Surgeon:  Jettie Booze, MD;  Location: [place] [facility];  Service: Cardiovascular;  Laterality: N/A;  lad  . CYSTOSCOPY/URETEROSCOPY/HOLMIUM LASER/STENT PLACEMENT Right 01/02/2018   Procedure: CYSTOSCOPY/URETEROSCOPY/HOLMIUM LASER/STENT PLACEMENT;  Surgeon: [name], MD;  Location: [facility];  Service: Urology;  Laterality: Right;  . ESOPHAGOGASTRODUODENOSCOPY (EGD) WITH PROPOFOL N/A 11/19/2016   Procedure: ESOPHAGOGASTRODUODENOSCOPY (EGD) WITH PROPOFOL;  Surgeon: [name], MD;  Location: [facility];  Service: Endoscopy;  Laterality: N/A;  . ESOPHAGOGASTRODUODENOSCOPY (EGD) WITH PROPOFOL N/A 02/02/2019   Procedure: ESOPHAGOGASTRODUODENOSCOPY (EGD) WITH PROPOFOL;  Surgeon: [name], MD;  Location: [facility];  Service: Endoscopy;  Laterality: N/A;  . INTRAVASCULAR PRESSURE WIRE/FFR STUDY N/A 07/07/2019   Procedure: INTRAVASCULAR PRESSURE WIRE/FFR STUDY;  Surgeon: Nelva Bush, MD;  Location: [place] [facility];  Service: Cardiovascular;  Laterality: N/A;  . INTRAVASCULAR ULTRASOUND/IVUS N/A 07/08/2019   Procedure: Intravascular Ultrasound/IVUS;  Surgeon: Jettie Booze, MD;  Location: [place] [facility];  Service: Cardiovascular;  Laterality: N/A;  . LEFT HEART CATH N/A 07/08/2019   Procedure: Left Heart Cath;  Surgeon: Jettie Booze, MD;  Location: [place] [facility];  Service: Cardiovascular;  Laterality: N/A;  . LEFT HEART CATH AND CORONARY ANGIOGRAPHY N/A 07/07/2019   Procedure: LEFT HEART CATH AND CORONARY ANGIOGRAPHY;  Surgeon: Nelva Bush, MD;  Location: [place] [facility];  Service: Cardiovascular;  Laterality: N/A;  . LITHOTRIPSY  2015  . MELANOMA EXCISION  1980   malignant  . NERVE SURGERY  2015   ulna nerve  . TONSILLECTOMY  1945  . WISDOM TOOTH EXTRACTION       Medications Prior to Admission  Medication Sig Dispense Refill Last Dose  . acetaminophen (TYLENOL) 650 MG CR tablet Take 650 mg by mouth 2 (two) times daily as needed (for pain).     unk at Unknown time  . aspirin 325 MG EC tablet Take 650 mg by mouth once as needed ("for cardiac issues").   07/07/2019 at 0530  . aspirin EC 81 MG tablet Take 81 mg by mouth daily.    07/06/2019 at 0800  . carvedilol (COREG) 3.125 MG tablet Take 3.125 mg by mouth 2 (two) times daily with a meal.   07/06/2019 at 1800  . ezetimibe (ZETIA) 10 MG tablet TAKE 1 TABLET(10 MG) BY MOUTH DAILY (Patient taking differently: Take 10 mg by mouth at bedtime. ) 90 tablet 0 07/06/2019 at pm  . fluticasone (FLONASE) 50 MCG/ACT nasal spray Place 2 sprays into both nostrils daily. (Patient taking differently: Place 2 sprays into both nostrils daily as needed (for seasonal allergies). ) 16 g 6 unk at unk  . glipiZIDE (GLUCOTROL XL) 5 MG 24 hr tablet Take 1 tablet (5 mg total) by mouth 2 (two) times daily. With food (Patient taking differently: Take 5 mg by mouth 2 (two) times daily with a meal. ) 180 tablet 3 07/06/2019 at Unknown time  . hydrochlorothiazide (HYDRODIURIL) 25 MG tablet  Take 25 mg by mouth daily with breakfast.   07/06/2019 at am  . hydrocortisone cream 1 % Apply 1 application topically See admin instructions. Apply to affected area/rash every 3 days   07/05/2019 at Unknown time  . lisinopril (ZESTRIL) 10 MG tablet TAKE 1 TABLET BY MOUTH DAILY (Patient taking differently: Take 10 mg by mouth. ) 90 tablet 1 07/06/2019 at am  . Multiple Vitamin (MULTIVITAMIN) capsule Take 1 capsule by mouth daily with lunch.    07/06/2019 at Unknown time  . naproxen sodium (ALEVE) 220 MG tablet Take 220 mg by mouth 2 (two) times daily as needed (for pain).   unk at [employer]  . Omega-3 Fatty Acids (FISH OIL PO) Take 1 capsule by mouth daily at 12 noon.    07/06/2019 at Unknown time  . pantoprazole (PROTONIX) 40 MG tablet TAKE 1 TABLET BY MOUTH TWICE DAILY (Patient taking differently: Take 40 mg by mouth daily before breakfast. ) 180 tablet 0 07/06/2019 at am  . Polyvinyl Alcohol-Povidone (MURINE TEARS FOR DRY EYES OP) Place 1-2 drops  into both eyes as needed (for dry eyes).    unk at [employer]  . psyllium (METAMUCIL) 58.6 % powder Take 1 packet by mouth at bedtime as needed (if no roughage is consumed that day- Mix and drink).    unk at unk  . rosuvastatin (CRESTOR) 10 MG tablet Take 10 mg by mouth at bedtime.   07/06/2019 at pm  . Semaglutide,0.25 or 0.5MG /DOS, (OZEMPIC, 0.25 OR 0.5 MG/DOSE,) 2 MG/1.5ML SOPN Inject 1 mg into the skin once a week. (Patient taking differently: Inject 1 mg into the skin every Sunday. ) 3 pen 3 07/04/2019 at Unknown time  . sodium chloride (OCEAN) 0.65 % SOLN nasal spray Place 1 spray into both nostrils as needed for congestion.   unk at unk  . tamsulosin (FLOMAX) 0.4 MG CAPS capsule TAKE 1 CAPSULE(0.4 MG) BY MOUTH DAILY (Patient taking differently: Take 0.4 mg by mouth daily with lunch. ) 90 capsule 3 07/06/2019 at 1200  . traMADol (ULTRAM) 50 MG tablet TAKE 1 TABLET BY MOUTH EVERY 12 HOURS AS NEEDED (Patient taking differently: Take 50 mg by mouth every 12 (twelve) hours. ) 60 tablet 1 07/06/2019 at pm  . ipratropium (ATROVENT) 0.06 % nasal spray Place 2 sprays into both nostrils 3 (three) times daily. (Patient not taking: Reported on 07/07/2019) 15 mL 12 Not Taking at Unknown time    Inpatient Medications:  . aspirin  81 mg Oral Daily  . clopidogrel  75 mg Oral Q breakfast  . ezetimibe  10 mg Oral QHS  . insulin aspart  0-15 Units Subcutaneous TID WC  . lisinopril  10 mg Oral Daily  . multivitamin with minerals  1 tablet Oral Daily  . pantoprazole  40 mg Oral QAC breakfast  . rosuvastatin  40 mg Oral QHS  . sodium chloride flush  3 mL Intravenous Q12H  . sodium chloride flush  3 mL Intravenous Q12H  . tamsulosin  0.4 mg Oral Q lunch  . traMADol  50 mg Oral Q12H    Allergies:  Allergies  Allergen Reactions  . Metformin And Related Diarrhea and Other (See Comments)    Leg cramps, also  . Azithromycin Other (See Comments)    Not recommended - no reaction  . Other Other (See Comments)    If  taking antibiotics for awhile, thrush results  . Percocet [Oxycodone-Acetaminophen] Other (See Comments)    Hallucinations    Social History  Socioeconomic History  . Marital status: Married    Spouse name: Not on file  . Number of children: Not on file  . Years of education: Not on file  . Highest education level: Not on file  Occupational History  . Not on file  Tobacco Use  . Smoking status: Current Some Day Smoker    Types: Pipe  . Smokeless tobacco: Former Systems developer    Quit date: 10/17/2015  Vaping Use  . Vaping Use: Never used  Substance and Sexual Activity  . Alcohol use: Yes    Alcohol/week: 0.0 - 1.0 standard drinks    Comment: none last 24hrs  . Drug use: No  . Sexual activity: Not on file  Other Topics Concern  . Not on file  Social History Narrative   Married    Social Determinants of Health   Financial Resource Strain: Medium Risk  . Difficulty of Paying Living Expenses: Somewhat hard  Food Insecurity:   . Worried About Charity fundraiser in the Last Year:   . Arboriculturist in the Last Year:   Transportation Needs:   . Film/video editor (Medical):   [name] Lack of Transportation (Non-Medical):   Physical Activity: Insufficiently Active  . Days of Exercise per Week: 3 days  . Minutes of Exercise per Session: 30 min  Stress:   . Feeling of Stress :   Social Connections:   . Frequency of Communication with Friends and Family:   . Frequency of Social Gatherings with Friends and Family:   . Attends Religious Services:   . Active Member of Clubs or Organizations:   . Attends Archivist Meetings:   [name] Marital Status:   Intimate Partner Violence:   . Fear of Current or Ex-Partner:   . Emotionally Abused:   [name] Physically Abused:   . Sexually Abused:      Family History  Problem Relation Age of Onset  . Hyperlipidemia Mother   . Hypertension Mother   . Heart disease Mother   . Diabetes Mother   . Heart attack Mother   . Colon cancer Father    . Lung cancer Father   . Kidney cancer Father        malignant capsulated kidney tumor  . Diabetes Father   . Liver cancer Father   . Bladder Cancer Neg Hx   . Prostate cancer Neg Hx      Review of Systems: All other systems reviewed and are otherwise negative except as noted above.  Physical Exam: Vitals:   07/08/19 2029 07/08/19 2344 07/09/19 0544 07/09/19 0843  BP: (!) 141/79 (!) 145/77 (!) 147/87 137/83  Pulse: 60 66 82 67  Resp: 20 16 18 16   Temp: 98.2 F (36.8 C) 97.9 F (36.6 C) 98.1 F (36.7 C) 98.1 F (36.7 C)  TempSrc: Oral Oral Oral Oral  SpO2: 98% 95% 97% 96%  Weight:   90.9 kg   Height:        GEN- The patient is well appearing, alert and oriented x 3 today.   HEENT: normocephalic, atraumatic; sclera clear, conjunctiva pink; hearing intact; oropharynx clear; neck supple Lungs- Clear to ausculation bilaterally, normal work of breathing.  No wheezes, rales, rhonchi Heart- Regular rate and rhythm, no murmurs, rubs or gallops GI- soft, non-tender, non-distended, bowel sounds present Extremities- no clubbing, cyanosis, or edema; DP/PT/radial pulses 2+ bilaterally MS- no significant deformity or atrophy Skin- warm and dry, no rash or lesion Psych- euthymic mood, full  affect Neuro- strength and sensation are intact  Labs:   Lab Results  Component Value Date   WBC 7.3 07/09/2019   HGB 14.6 07/09/2019   HCT 42.8 07/09/2019   MCV 88.4 07/09/2019   PLT 173 07/09/2019    Recent Labs  Lab 07/07/19 0543 07/08/19 0421 07/09/19 0710  NA 139   < > 137  K 4.0   < > 4.4  CL 103   < > 105  CO2 24   < > 23  BUN 22   < > 10  CREATININE 0.86   < > 0.64  CALCIUM 9.1   < > 8.5*  PROT 7.4  --   --   BILITOT 2.0*  --   --   ALKPHOS 55  --   --   ALT 41  --   --   AST 47*  --   --   GLUCOSE 194*   < > 123*   < > = values in this interval not displayed.      Radiology/Studies: CARDIAC CATHETERIZATION  Addendum Date: 07/08/2019    Prox LAD to Mid LAD  lesion is 80% stenosed.  Following atherectomy and multiple high-pressure balloon inflations, A drug-eluting stent was successfully placed using a STENT RESOLUTE ONYX 4.5X18, postdilated to 4.77 mm and optimized with intravascular ultrasound.  Post intervention, there is a 0% residual stenosis.  1st Diag lesion is 75% stenosed.  Scoring balloon angioplasty was performed using a [place].  Post intervention, there is a 20% residual stenosis.  Mid LAD to Dist LAD lesion is 30% stenosed.  Prox RCA to Dist RCA lesion is 100% stenosed. Known from prior catheterization. Left to right collaterals visible.  Patent stent in the ramus.  LV end diastolic pressure is normal.  There is mild aortic valve stenosis.  Continue aggressive secondary prevention and dual antiplatelet therapy.  After 12 months, would consider clopidogrel monotherapy given the ectasia and extent of calcification of his vessels.   Result Date: 07/08/2019  Prox LAD to Mid LAD lesion is 80% stenosed.  Following atherectomy and multiple high-pressure balloon inflations, A drug-eluting stent was successfully placed using a STENT RESOLUTE ONYX 4.5X18, postdilated to 4.77 mm and optimized with intravascular ultrasound.  Post intervention, there is a 0% residual stenosis.  1st Diag lesion is 75% stenosed.  Scoring balloon angioplasty was performed using a [place].  Post intervention, there is a 20% residual stenosis.  Mid LAD to Dist LAD lesion is 30% stenosed.  Prox RCA to Dist RCA lesion is 100% stenosed. Known from prior catheterization. Left to right collaterals visible.  Patent stent in the ramus.  LV end diastolic pressure is normal.  There is mild aortic valve stenosis.  Continue aggressive secondary prevention and dual antiplatelet therapy.  After 12 months, would consider clopidogrel monotherapy given the ectasia and extent of calcification of his vessels.   CARDIAC CATHETERIZATION  Result  Date: 07/07/2019 Conclusions: 1. Severe two-vessel coronary artery disease, with aneurysmal dilation of the proximal and mid LAD and heavy calcification.  There is a focal 80% proximal/mid LAD stenosis at the takeoff of D1, which is hemodynamically significant (iFR = 0.84).  The ostium of D1 also contains a 78-80% stenosis.  The proximal RCA is chronically occluded, with the distal branches filling via left-to-right collaterals (attempted PCI of RCA CTO was unsuccessful in ~2005). 2. Patent stent in ramus intermedius. 3. Grossly normal left ventricular contraction with mildly elevated filling pressure. Recommendations: 1. Case  reviewed with Dr. Irish Lack.  We Leonides Minder arrange for transfer to [facility] for atherectomy/PCI of LAD. 2. Obtain transthoracic echocardiogram. 3. Aggressive secondary prevention.  We Charnell Peplinski load [name] [name] with clopidogrel, with continuation of dual antiplatelet therapy for at least 12 months. Nelva Bush, MD [facility]   ECHOCARDIOGRAM COMPLETE  Result Date: 07/07/2019    ECHOCARDIOGRAM REPORT   Patient Name:   [name] [name] Date of Exam: 07/07/2019 Medical Rec #:  [id number]         Height:       72.0 in Accession #:    [id number]        Weight:       197.0 lb Date of Birth:  Aug 08, 1940          BSA:          2.117 m Patient Age:    57 years          BP:           129/92 mmHg Patient Gender: M                 HR:           60 bpm. Exam Location:  [facility] Procedure: 2D Echo, Cardiac Doppler and Color Doppler Indications:     Chest pain 786.50  History:         Patient has prior history of Echocardiogram examinations, most                  recent 03/25/2017. CAD and Previous Myocardial Infarction; Risk                  Factors:Diabetes and Hypertension. Aortic insufficiency.  Sonographer:     Sherrie Sport RDCS (AE) Referring Phys:  [place] Diagnosing Phys: Harrell Gave End MD  Sonographer Comments: Technically challenging study due to limited acoustic windows, no  parasternal window and no subcostal window. Pt kept supine in cath recovery. IMPRESSIONS  1. Left ventricular ejection fraction, by estimation, is 45 to 50%. The left ventricle has mildly decreased function. The left ventricle demonstrates regional wall motion abnormalities (see scoring diagram/findings for description). Left ventricular diastolic parameters are consistent with Grade I diastolic dysfunction (impaired relaxation). There is akinesis of the left ventricular, basal inferior wall.  2. Right ventricular systolic function was not well visualized. The right ventricular size is not well visualized.  3. The mitral valve is grossly normal. Trivial mitral valve regurgitation.  4. Tricuspid valve regurgitation not well assessed.  5. The aortic valve was not well visualized. Aortic valve regurgitation is not visualized. No aortic stenosis is present.  6. Pulmonic valve regurgitation not assessed. FINDINGS  Left Ventricle: Left ventricular dimensions not well assessed. Left ventricular ejection fraction, by estimation, is 45 to 50%. The left ventricle has mildly decreased function. The left ventricle demonstrates regional wall motion abnormalities. Left ventricular diastolic parameters are consistent with Grade I diastolic dysfunction (impaired relaxation). Right Ventricle: The right ventricular size is not well visualized. Right vetricular wall thickness was not assessed. Right ventricular systolic function was not well visualized. Left Atrium: Left atrial size was normal in size. Right Atrium: Right atrial size was normal in size. Pericardium: There is no evidence of pericardial effusion. Mitral Valve: The mitral valve is grossly normal. Trivial mitral valve regurgitation. Tricuspid Valve: The tricuspid valve is not well visualized. Tricuspid valve regurgitation not well assessed. Aortic Valve: The aortic valve was not well visualized. Aortic valve regurgitation is not visualized. No  aortic stenosis is present.  Aortic valve mean gradient measures 4.0 mmHg. Aortic valve peak gradient measures 6.9 mmHg. Aortic valve area, by VTI measures 1.64 cm. Pulmonic Valve: The pulmonic valve was not well visualized. Pulmonic valve regurgitation not assessed. Aorta: The aortic root was not well visualized. Pulmonary Artery: The pulmonary artery is not well seen. Venous: The inferior vena cava was not well visualized. IAS/Shunts: The interatrial septum was not well visualized.  LEFT VENTRICLE PLAX 2D LVIDd:         4.24 cm      Diastology LVIDs:         3.14 cm      LV e' lateral:   4.35 cm/s LV PW:         1.48 cm      LV E/e' lateral: 10.8 LV IVS:        1.12 cm      LV e' medial:    4.57 cm/s LVOT diam:     2.10 cm      LV E/e' medial:  10.3 LV SV:         50 LV SV Index:   23 LVOT Area:     3.46 cm  LV Volumes (MOD) LV vol d, MOD A2C: 144.0 ml LV vol s, MOD A2C: 67.4 ml LV vol s, MOD A4C: 63.4 ml LV SV MOD A2C:     76.6 ml RIGHT VENTRICLE RV S prime:     11.70 cm/s TAPSE (M-mode): 3.9 cm LEFT ATRIUM             Index       RIGHT ATRIUM           Index LA diam:        3.40 cm 1.61 cm/m  RA Area:     16.00 cm LA Vol (A2C):   45.1 ml 21.30 ml/m RA Volume:   39.80 ml  18.80 ml/m LA Vol (A4C):   24.0 ml 11.34 ml/m LA Biplane Vol: 35.6 ml 16.82 ml/m  AORTIC VALVE AV Area (Vmax):    1.60 cm AV Area (Vmean):   1.49 cm AV Area (VTI):     1.64 cm AV Vmax:           131.13 cm/s AV Vmean:          87.600 cm/s AV VTI:            0.303 m AV Peak Grad:      6.9 mmHg AV Mean Grad:      4.0 mmHg LVOT Vmax:         60.70 cm/s LVOT Vmean:        37.800 cm/s LVOT VTI:          0.143 m LVOT/AV VTI ratio: 0.47 MITRAL VALVE               TRICUSPID VALVE MV Area (PHT): 2.00 cm    TR Peak grad:   16.2 mmHg MV Decel Time: 379 msec    TR Vmax:        201.00 cm/s MV E velocity: 47.10 cm/s MV A velocity: 99.00 cm/s  SHUNTS MV E/A ratio:  0.48        Systemic VTI:  0.14 m                            Systemic Diam: 2.10 cm Nelva Bush MD  Electronically signed by Nelva Bush MD Signature  Date/Time: 07/07/2019/1:33:31 PM    Final     EKG: on admission shows ?VT at 172 bpm. Follow up EKG shows sinus bradycardia at 41 bpm with QRS of 127 ms (personally reviewed)  TELEMETRY: Sinus brady/NSR 50-70 with relatively frequent PVCs(personally reviewed)   Assessment/Plan: 1.  WCT/ likely VT In review with Dr. Curt Bears appears to have been VT in the setting of his MI.  EF is 45-50% by Echo this admission.  In the setting of acute illness/MI. Pt was on coreg 3.125 mg BID at home.  Spontaneously converted to sinus bradycardia into the 40s.  His rhythm has been stable with PVCs post PCI.  Would maximize beta blocker as tolerated in the setting of VT.   2. Known CAD s/p NSTEMI with DES of LAD and balloon angioplasty of first diagonal this admission Denies chest pain.  Plan for discharge today and close TOC follow up.   3. HTN Stable, mildly elevated Continue ACE Areil Ottey discuss BB with Dr. Curt Bears, but suspect Norvin Ohlin be stable to restart at low dose and follow.   4. Sinus bradycardia This has resolved s/p PCI, though he did initially require atropine.   Would add and titrate beta blocker as tolerated, as arrhythmia appears to have been VT in the setting of his NSTEMI  For questions or updates, please contact [place] Please consult [email or web address] for contact info under Cardiology/STEMI.  Signed, Shirley Friar, PA-C  07/09/2019 9:10 AM   I have seen and examined this patient with Oda Kilts.  Agree with above, note added to reflect my findings.  On exam, RRR, no murmurs, lungs clear. Patient admitted to the hospital with a non-STEMI. On presentation to the emergency room, he was in a wide-complex tachycardia. Wide-complex tachycardia appears due to ventricular tachycardia. He does have some evidence of fusion beats on his ECG. He had this arrhythmia prior to catheterization and stenting. It is likely that this was an  ischemic driven rhythm. Would optimize his heart failure medications. No antiarrhythmics or ICD indicated at this time.  Markie Heffernan M. Jemeka Wagler MD 07/09/2019 3:13 PM

## 2019-07-09 NOTE — Telephone Encounter (Signed)
Per PA, set up [facility] Visit on 07/14/19 at 8:25 am with Murray Hodgkins.

## 2019-07-09 NOTE — Discharge Instructions (Signed)
Heart Attack The heart is a muscle that needs oxygen to survive. A heart attack is a condition that occurs when your heart does not get enough oxygen. When this happens, the heart muscle begins to die. This can cause permanent damage if not treated right away. A heart attack is a medical emergency. This condition may be called a myocardial infarction, or MI. It is also known as acute coronary syndrome (ACS). ACS is a term used to describe a group of conditions that affect blood flow to the heart. What are the causes? This condition may be caused by:  Atherosclerosis. This occurs when a fatty substance called plaque builds up in the arteries and blocks or reduces blood supply to the heart.  A blood clot. A blood clot can develop suddenly when plaque breaks up within an artery and blocks blood flow to the heart.  Low blood pressure.  An abnormal heartbeat (arrhythmia).  Conditions that cause a decrease of oxygen to the heart, such as anemiaorrespiratory failure.  A spasm, or severe tightening, of a blood vessel that cuts off blood flow to the heart.  Tearing of a coronary artery (spontaneous coronary artery dissection).  High blood pressure. What increases the risk? The following factors may make you more likely to develop this condition:  Aging. The older you are, the higher your risk.  Having a personal or family history of chest pain, heart attack, stroke, or narrowing of the arteries in the legs, arms, head, or stomach (peripheral artery disease).  Being male.  Smoking.  Not getting regular exercise.  Being overweight or obese.  Having high blood pressure.  Having high cholesterol (hypercholesterolemia).  Having diabetes.  Drinking too much alcohol.  Using illegal drugs, such as cocaine or methamphetamine. What are the signs or symptoms? Symptoms of this condition may vary, depending on factors like gender and age. Symptoms may include:  Chest pain. It may feel  like: ? Crushing or squeezing. ? Tightness, pressure, fullness, or heaviness.  Pain in the arm, neck, jaw, back, or upper body.  Shortness of breath.  Heartburn or upset stomach.  Nausea.  Sudden cold sweats.  Feeling tired.  Sudden light-headedness. How is this diagnosed? This condition may be diagnosed through tests, such as:  Electrocardiogram (ECG) to measure the electrical activity of your heart.  Blood tests to check for cardiac markers. These chemicals are released by a damaged heart muscle.  A test to evaluate blood flow and heart function (coronary angiogram).  CT scan to see the heart more clearly.  A test to evaluate the pumping action of the heart (echocardiogram). How is this treated? A heart attack must be treated as soon as possible. Treatment may include:  Medicines to: ? Break up or dissolve blood clots (fibrinolytic therapy). ? Thin blood and help prevent blood clots. ? Treat blood pressure. ? Improve blood flow to the heart. ? Reduce pain. ? Reduce cholesterol.  Angioplasty and stent placement. These are procedures to widen a blocked artery and keep it open.  Coronary artery bypass graft, CABG, or open heart surgery. This enables blood to flow to the heart by going around the blocked part of the artery.  Oxygen therapy if needed.  Cardiac rehabilitation. This improves your health and well-being through exercise, education, and counseling. Follow these instructions at home: Medicines  Take over-the-counter and prescription medicines only as told by your health care provider.  Do not take the following medicines unless your health care provider says it is okay  to take them: ? NSAIDs, such as ibuprofen. ? Supplements that contain vitamin A, vitamin E, or both. ? Hormone replacement therapy that contains estrogen with or without progestin. Lifestyle   Do not use any products that contain nicotine or tobacco, such as cigarettes, e-cigarettes,  and chewing tobacco. If you need help quitting, ask your health care provider.  Avoid secondhand smoke.  Exercise regularly. Ask your health care provider about participating in a cardiac rehabilitation program that helps you start exercising safely after a heart attack.  Eat a heart-healthy diet. Your health care provider will tell you what foods to eat.  Maintain a healthy weight.  Learn ways to manage stress.  Do not use illegal drugs. Alcohol use  Do not drink alcohol if: ? Your health care provider tells you not to drink. ? You are pregnant, may be pregnant, or are planning to become pregnant.  If you drink alcohol: ? Limit how much you use to:  0-1 drink a day for women.  0-2 drinks a day for men. ? Be aware of how much alcohol is in your drink. In the [place] one drink equals one 12 oz bottle of beer (355 mL), one 5 oz glass of wine (148 mL), or one 1 oz glass of hard liquor (44 mL). General instructions  Work with your health care provider to manage any other conditions you have, such as high blood pressure or diabetes. These conditions affect your heart.  Get screened for depression, and seek treatment if needed.  Keep your vaccinations up to date. Get the flu vaccine every year.  Keep all follow-up visits as told by your health care provider. This is important. Contact a health care provider if:  You feel overwhelmed or sad.  You have trouble doing your daily activities. Get help right away if:  You have sudden, unexplained discomfort in your chest, arms, back, neck, jaw, or upper body.  You have shortness of breath.  You suddenly start to sweat or your skin gets clammy.  You feel nauseous or you vomit.  You have unexplained tiredness or weakness.  You suddenly feel light-headed or dizzy.  You notice your heart starts to beat fast or feels like it is skipping beats.  You have blood pressure that is higher than 180/120. These symptoms may represent a  serious problem that is an emergency. Do not wait to see if the symptoms will go away. Get medical help right away. Call your local emergency services (911 in the [place] Do not drive yourself to the hospital. Summary  A heart attack, also called myocardial infarction, is a condition that occurs when your heart does not get enough oxygen. This is caused by anything that blocks or reduces blood flow to the heart.  Treatment is a combination of medicines and surgeries, if needed, to open the blocked arteries and restore blood flow to the heart.  A heart attack is an emergency. Get help right away if you have sudden discomfort in your chest, arms, back, neck, jaw, or upper body. Seek help if you feel nauseous, you vomit, or you feel light-headed or dizzy. This information is not intended to replace advice given to you by your health care provider. Make sure you discuss any questions you have with your health care provider. Document Revised: 04/09/2018 Document Reviewed: 04/13/2018 [employer] Patient Education  2020 [employer]  

## 2019-07-09 NOTE — Plan of Care (Signed)
D/C to home with self care, d/c education complete.

## 2019-07-09 NOTE — Progress Notes (Signed)
CARDIAC REHAB PHASE I   PRE:  Rate/Rhythm: 63 SR PVCs  BP:  Supine:   Sitting: 137/83  Standing:    SaO2: 95%RA  MODE:  Ambulation: 470 ft   POST:  Rate/Rhythm: 78 SR bigeminy PVCs  BP:  Supine:   Sitting: 155/89  Standing:    SaO2: 99%RA [id number] Pt walked 470 ft on RA with steady gait and no CP. Tolerated well. MI education completed with pt who voiced understanding. Stressed importance of plavix with stent. Reviewed NTG use(pt knows not to take if BP low), walking for ex, heart healthy and low carb food choices, and CRP 2. Pt has attended CRP 2 before. Referral placed to [facility]. Pt stated he is contemplating not smoking his pipe which he only does occasionally.    Graylon Good, RN BSN  07/09/2019 9:31 AM

## 2019-07-09 NOTE — Discharge Summary (Addendum)
Discharge Summary    Patient ID: Abdelaziz Westenberger MRN: [id number]; DOB: 06-25-1940  Admit date: 07/07/2019 Discharge date: 07/09/2019  Primary Care Provider: [name], [name]  Primary Cardiologist: Ida Rogue, MD  Primary Electrophysiologist: Dr. Curt Bears, MD  Discharge Diagnoses    Principal Problem:   NSTEMI (non-ST elevated myocardial infarction) [facility]) Active Problems:   CAD (coronary artery disease)   Essential hypertension   Mixed hyperlipidemia   Controlled type 2 diabetes mellitus with complication, without long-term current use of insulin ([place])   VT (ventricular tachycardia) ([place])  Diagnostic Studies/Procedures    Coronary arthrectomy 07/08/19:   Prox LAD to Mid LAD lesion is 80% stenosed.  Following atherectomy and multiple high-pressure balloon inflations, A drug-eluting stent was successfully placed using a STENT RESOLUTE ONYX 4.5X18, postdilated to 4.77 mm and optimized with intravascular ultrasound.  Post intervention, there is a 0% residual stenosis.  1st Diag lesion is 75% stenosed.  Scoring balloon angioplasty was performed using a [place].  Post intervention, there is a 20% residual stenosis.  Mid LAD to Dist LAD lesion is 30% stenosed.  Prox RCA to Dist RCA lesion is 100% stenosed. Known from prior catheterization. Left to right collaterals visible.  Patent stent in the ramus.  LV end diastolic pressure is normal.  There is mild aortic valve stenosis.  Continue aggressive secondary prevention and dual antiplatelet therapy. After 12 months, would consider clopidogrel monotherapy given the ectasia and extent of calcification of his vessels.   LHC 07/07/19:  Conclusions: 1. Severe two-vessel coronary artery disease, with aneurysmal dilation of the proximal and mid LAD and heavy calcification. There is a focal 80% proximal/mid LAD stenosis at the takeoff of D1, which is hemodynamically significant (iFR = 0.84). The  ostium of D1 also contains a 78-80% stenosis. The proximal RCA is chronically occluded, with the distal branches filling via left-to-right collaterals (attempted PCI of RCA CTO was unsuccessful in ~2005). 2. Patent stent in ramus intermedius. 3. Grossly normal left ventricular contraction with mildly elevated filling pressure.  Recommendations: 1. Case reviewed with Dr. Irish Lack. We will arrange for transfer to [facility] for atherectomy/PCI of LAD. 2. Obtain transthoracic echocardiogram. 3. Aggressive secondary prevention. We will load Mr. Raver with clopidogrel, with continuation of dual antiplatelet therapy for at least 12 months.  Echocardiogram 6//23/21:  1. Left ventricular ejection fraction, by estimation, is 45 to 50%. The  left ventricle has mildly decreased function. The left ventricle  demonstrates regional wall motion abnormalities (see scoring  diagram/findings for description). Left ventricular  diastolic parameters are consistent with Grade I diastolic dysfunction  (impaired relaxation). There is akinesis of the left ventricular, basal  inferior wall.  2. Right ventricular systolic function was not well visualized. The right  ventricular size is not well visualized.  3. The mitral valve is grossly normal. Trivial mitral valve  regurgitation.  4. Tricuspid valve regurgitation not well assessed.  5. The aortic valve was not well visualized. Aortic valve regurgitation  is not visualized. No aortic stenosis is present.  6. Pulmonic valve regurgitation not assessed. _____________   History of Present Illness     [name] [name] is a 79 y.o. male with a history of CAD status post prior percutaneous interventions, hypertension, hyperlipidemia, type 2 diabetes mellitus, and ischemic cardiomyopathy, who was seen at [facility] for the evaluation ofnon-STEMI.  Over the past 2 weeks prior to admission, he had periodically been waking at night feeling diaphoretic but  without other symptoms. On morning of presentation, he  awoke at approximately 4 AM feeling diaphoretic and somewhat weak. He used the bathroom and upon returning to bed, he reported that he did not feel right. He was not having chest pain but did note mild discomfort across his maxilla. He checked his blood pressure and noted a systolic in the 84X. At this, he became concerned and alert his wife, who brought to the emergency department. Upon arrival to the ED, he began to experience chest discomfort in addition to ongoing maxillary pain.Initial triage ECG showed tachycardia at a rate of 172 bpm, felt to represent SVT with aberrancy. The ED team had plan to give adenosine however, after placing an IV, he became bradycardic with rates in the 40s-question sinus bradycardia versus junctional rhythm per ER report. Initial blood pressure was 68/49 and he was given atropine with improvement in heart rate and rise in blood pressures into the 1 teens. Initial troponin was 20 however follow-up returned at 794. He underwent diagnostic cath @ [facility] revealing a CTO of the RCA and severe LAD dzs.  Decision was made to tx to [facility] for atherectomy and PCI of the LAD.  Hospital Course     He is now s/p atherectomy with multiple high-pressure balloon inflations s/p DES placement to the pLAD>>mLAD region.1st Diag lesion is 75% stenosed s/p scoring balloon angioplasty Mid LAD to Dist LAD lesion is 30% stenosed. There is residual disease in the pRCA to dRCA lesion is 100% stenosed known from prior catheterization. Patent stent in the ramus. There was mild aortic valve stenosis. Recommendations are for uninterrupted dual antiplatelet therapy with Aspirin 81mg  daily and Clopidogrel 75mg  daily for a minimum of 12 months then possibly clopidogrel monotherapy long-term given the large, ectatic vessels and diffuse calcification  Hospital problems include:  NSTEMI: -Presented with chest pain which resolved 51min after ED  arrival>>hx ofPTCA of an unknown vessel 1991 followed by drug-eluting stent placement in 2005. Most recent ischemic evaluation 2015 showed prior inferior infarct without evidence of ischemia. -EKG withanterolateral ST depressionwithfollow-up EKGwithsinus bradycardia and nosignificant ST or T changes. -Initial troponin was 20 with a follow-up high-sensitivity troponin of 794.  -Underwent cath revealing severe LAD dzs w/ CTO of the RCAwith plans to tx to [facility] for stent intervention>>>Prox LAD to Mid LAD lesion is 80% stenosed>>s/p atherectomy and multiple high-pressure balloon inflations with ultimate DES placement. 1st Diag lesion is 75% stenosed s/p scoring balloon angioplasty Mid LAD to Dist LAD lesion is 30% stenosed. There is residual disease in the pRCA to dRCA lesion is 100% stenosed. Known from prior catheterization. Patent stent in the ramus. There is mild aortic valve stenosis. -Plan for aggressive secondary prevention and dual antiplatelet therapy. After 12 months, would consider clopidogrel monotherapy given the ectasia and extent of calcification of his vessels. -No chest pain today  -Cath site stable   Essential hypertension: -Stable, 147/87>145/77>141/79 -Beta-blocker initially held given bradycardia on hospital presentation  -See plan below -Continue home dose of ACE inhibitor   Hyperlipidemia: -Last LDL, 55 01/2019 -Continuestatin and Zetia therapy   SVT with aberrancyvs VT: -Noted on triage EKGand withspontaneous break>>>to SB -EP consulted (Dr. Curt Bears) prior to hospital discharge, felt EKG consistent with VT with recommendations for continuation of beta-blocker therapy and titrate as tolerated.  Plan is to follow closely given bradycardia however this was prior to revascularization. -Outpatient monitor discussed with patient per EP with plan for self monitoring of HR and BPs at home at this time -HR's have improved to the 60-70 range post intervention  Sinus bradycardia: -HR's improved to the 60-80's post intervention.  See plan per EP above -He required atropine and rates have stabilized in the 50s to 60s.   DM2: -On PTA once weekly home insulin>>resume   Consultants: None   There patient was seen and examined by Dr. Ellyn Hack who feels that he is stable and ready for discharge today, 07/09/2019 with close follow-up.  Cath site stable without signs or symptoms of hematoma.  Patient is ambulated with cardiac rehabilitation without complication.   Did the patient have an acute coronary syndrome (MI, NSTEMI, STEMI, etc) this admission?:  Yes                               AHA/ACC Clinical Performance & Quality Measures: 4. Aspirin prescribed? - Yes 5. ADP Receptor Inhibitor (Plavix/Clopidogrel, Brilinta/Ticagrelor or Effient/Prasugrel) prescribed (includes medically managed patients)? - Yes 6. Beta Blocker prescribed? - Yes 7. High Intensity Statin (Lipitor 40-80mg  or Crestor 20-40mg ) prescribed? - Yes 8. EF assessed during THIS hospitalization? - Yes 9. For EF <40%, was ACEI/ARB prescribed? - Not Applicable (EF >/= 94%) 10. For EF <40%, Aldosterone Antagonist (Spironolactone or Eplerenone) prescribed? - Not Applicable (EF >/= 49%) 11. Cardiac Rehab Phase II ordered (including medically managed patients)? - Yes   _____________  Discharge Vitals Blood pressure 136/73, pulse (!) 58, temperature 97.7 F (36.5 C), temperature source Oral, resp. rate 17, height 6\' 1"  (1.854 m), weight 90.9 kg, SpO2 96 %.  Filed Weights   07/07/19 1725 07/08/19 0633 07/09/19 0544  Weight: 93.4 kg 91.7 kg 90.9 kg  General appearance: alert, cooperative, appears stated age and no distress Neck: no carotid bruit and no JVD Lungs: clear to auscultation bilaterally, normal percussion bilaterally and Nonlabored, good air movement Heart: regular rate and rhythm, S1, S2 normal, no murmur, click, rub or gallop and normal apical impulse Abdomen: soft,  non-tender; bowel sounds normal; no masses,  no organomegaly Extremities: extremities normal, atraumatic, no cyanosis or edema and R radial cath site with mild ecchymoses, mildly tender - but + [place] Pulses: 2+ and symmetric Neurologic: Grossly normal   Labs & Radiologic Studies    CBC Recent Labs    07/07/19 0543 07/07/19 0543 07/08/19 0421 07/09/19 0710  WBC 7.9   < > 6.8 7.3  NEUTROABS 4.6  --   --   --   HGB 16.3   < > 13.7 14.6  HCT 46.0   < > 40.8 42.8  MCV 86.1   < > 88.5 88.4  PLT 193   < > 173 173   < > = values in this interval not displayed.   Basic Metabolic Panel Recent Labs    07/07/19 0543 07/07/19 0543 07/08/19 0421 07/09/19 0710  NA 139   < > 140 137  K 4.0   < > 3.7 4.4  CL 103   < > 107 105  CO2 24   < > 22 23  GLUCOSE 194*   < > 113* 123*  BUN 22   < > 13 10  CREATININE 0.86   < > 0.64 0.64  CALCIUM 9.1   < > 8.4* 8.5*  MG 1.4*  --   --   --    < > = values in this interval not displayed.   Liver Function Tests Recent Labs    07/07/19 0543  AST 47*  ALT 41  ALKPHOS 55  BILITOT 2.0*  PROT 7.4  ALBUMIN 4.0   No results for input(s): LIPASE, AMYLASE in the last 72 hours. High Sensitivity Troponin:   Recent Labs  Lab 07/07/19 0543 07/07/19 0745  TROPONINIHS 20* 794*    BNP Invalid input(s): POCBNP D-Dimer No results for input(s): DDIMER in the last 72 hours. Hemoglobin A1C Recent Labs    07/07/19 2000  HGBA1C 7.0*   Fasting Lipid Panel No results for input(s): CHOL, HDL, LDLCALC, TRIG, CHOLHDL, LDLDIRECT in the last 72 hours. Thyroid Function Tests No results for input(s): TSH, T4TOTAL, T3FREE, THYROIDAB in the last 72 hours.  Invalid input(s): FREET3 _____________  CARDIAC CATHETERIZATION  Addendum Date: 07/08/2019    Prox LAD to Mid LAD lesion is 80% stenosed.  Following atherectomy and multiple high-pressure balloon inflations, A drug-eluting stent was successfully placed using a STENT RESOLUTE ONYX 4.5X18,  postdilated to 4.77 mm and optimized with intravascular ultrasound.  Post intervention, there is a 0% residual stenosis.  1st Diag lesion is 75% stenosed.  Scoring balloon angioplasty was performed using a [place].  Post intervention, there is a 20% residual stenosis.  Mid LAD to Dist LAD lesion is 30% stenosed.  Prox RCA to Dist RCA lesion is 100% stenosed. Known from prior catheterization. Left to right collaterals visible.  Patent stent in the ramus.  LV end diastolic pressure is normal.  There is mild aortic valve stenosis.  Continue aggressive secondary prevention and dual antiplatelet therapy.  After 12 months, would consider clopidogrel monotherapy given the ectasia and extent of calcification of his vessels.   Result Date: 07/08/2019  Prox LAD to Mid LAD lesion is 80% stenosed.  Following atherectomy and multiple high-pressure balloon inflations, A drug-eluting stent was successfully placed using a STENT RESOLUTE ONYX 4.5X18, postdilated to 4.77 mm and optimized with intravascular ultrasound.  Post intervention, there is a 0% residual stenosis.  1st Diag lesion is 75% stenosed.  Scoring balloon angioplasty was performed using a [place].  Post intervention, there is a 20% residual stenosis.  Mid LAD to Dist LAD lesion is 30% stenosed.  Prox RCA to Dist RCA lesion is 100% stenosed. Known from prior catheterization. Left to right collaterals visible.  Patent stent in the ramus.  LV end diastolic pressure is normal.  There is mild aortic valve stenosis.  Continue aggressive secondary prevention and dual antiplatelet therapy.  After 12 months, would consider clopidogrel monotherapy given the ectasia and extent of calcification of his vessels.   CARDIAC CATHETERIZATION  Result Date: 07/07/2019 Conclusions: 1. Severe two-vessel coronary artery disease, with aneurysmal dilation of the proximal and mid LAD and heavy calcification.  There is a focal 80%  proximal/mid LAD stenosis at the takeoff of D1, which is hemodynamically significant (iFR = 0.84).  The ostium of D1 also contains a 78-80% stenosis.  The proximal RCA is chronically occluded, with the distal branches filling via left-to-right collaterals (attempted PCI of RCA CTO was unsuccessful in ~2005). 2. Patent stent in ramus intermedius. 3. Grossly normal left ventricular contraction with mildly elevated filling pressure. Recommendations: 1. Case reviewed with Dr. Irish Lack.  We will arrange for transfer to [facility] for atherectomy/PCI of LAD. 2. Obtain transthoracic echocardiogram. 3. Aggressive secondary prevention.  We will load Mr. Craton with clopidogrel, with continuation of dual antiplatelet therapy for at least 12 months. Nelva Bush, MD [facility]   ECHOCARDIOGRAM COMPLETE  Result Date: 07/07/2019    ECHOCARDIOGRAM REPORT   Patient Name:   LIAN TANORI Date of Exam: 07/07/2019 Medical Rec #:  [id number]         Height:       72.0 in Accession #:    [id number]        Weight:       197.0 lb Date of Birth:  12-Feb-1940          BSA:          2.117 m Patient Age:    79 years          BP:           129/92 mmHg Patient Gender: M                 HR:           60 bpm. Exam Location:  [facility] Procedure: 2D Echo, Cardiac Doppler and Color Doppler Indications:     Chest pain 786.50  History:         Patient has prior history of Echocardiogram examinations, most                  recent 03/25/2017. CAD and Previous Myocardial Infarction; Risk                  Factors:Diabetes and Hypertension. Aortic insufficiency.  Sonographer:     Sherrie Sport RDCS (AE) Referring Phys:  [place] Diagnosing Phys: Harrell Gave End MD  Sonographer Comments: Technically challenging study due to limited acoustic windows, no parasternal window and no subcostal window. Pt kept supine in cath recovery. IMPRESSIONS  1. Left ventricular ejection fraction, by estimation, is 45 to 50%. The left ventricle has  mildly decreased function. The left ventricle demonstrates regional wall motion abnormalities (see scoring diagram/findings for description). Left ventricular diastolic parameters are consistent with Grade I diastolic dysfunction (impaired relaxation). There is akinesis of the left ventricular, basal inferior wall.  2. Right ventricular systolic function was not well visualized. The right ventricular size is not well visualized.  3. The mitral valve is grossly normal. Trivial mitral valve regurgitation.  4. Tricuspid valve regurgitation not well assessed.  5. The aortic valve was not well visualized. Aortic valve regurgitation is not visualized. No aortic stenosis is present.  6. Pulmonic valve regurgitation not assessed. FINDINGS  Left Ventricle: Left ventricular dimensions not well assessed. Left ventricular ejection fraction, by estimation, is 45 to 50%. The left ventricle has mildly decreased function. The left ventricle demonstrates regional wall motion abnormalities. Left ventricular diastolic parameters are consistent with Grade I diastolic dysfunction (impaired relaxation). Right Ventricle: The right ventricular size is not well visualized. Right vetricular wall thickness was not assessed. Right ventricular systolic function was not well visualized. Left Atrium: Left atrial size was normal in size. Right Atrium: Right atrial size was normal in size. Pericardium: There is no evidence of pericardial effusion. Mitral Valve: The mitral valve is grossly normal. Trivial mitral valve regurgitation. Tricuspid Valve: The tricuspid valve is not well visualized. Tricuspid valve regurgitation not well assessed. Aortic Valve: The aortic valve was not well visualized. Aortic valve regurgitation is not visualized. No aortic stenosis is present. Aortic valve mean gradient measures 4.0 mmHg. Aortic valve peak gradient measures 6.9 mmHg. Aortic valve area, by VTI measures 1.64 cm. Pulmonic Valve: The pulmonic valve was not  well visualized. Pulmonic valve regurgitation not assessed. Aorta: The aortic root was not well visualized. Pulmonary Artery: The pulmonary artery is not well seen. Venous: The inferior vena cava was not well visualized. IAS/Shunts: The interatrial septum was not well visualized.  LEFT VENTRICLE  PLAX 2D LVIDd:         4.24 cm      Diastology LVIDs:         3.14 cm      LV e' lateral:   4.35 cm/s LV PW:         1.48 cm      LV E/e' lateral: 10.8 LV IVS:        1.12 cm      LV e' medial:    4.57 cm/s LVOT diam:     2.10 cm      LV E/e' medial:  10.3 LV SV:         50 LV SV Index:   23 LVOT Area:     3.46 cm  LV Volumes (MOD) LV vol d, MOD A2C: 144.0 ml LV vol s, MOD A2C: 67.4 ml LV vol s, MOD A4C: 63.4 ml LV SV MOD A2C:     76.6 ml RIGHT VENTRICLE RV S prime:     11.70 cm/s TAPSE (M-mode): 3.9 cm LEFT ATRIUM             Index       RIGHT ATRIUM           Index LA diam:        3.40 cm 1.61 cm/m  RA Area:     16.00 cm LA Vol (A2C):   45.1 ml 21.30 ml/m RA Volume:   39.80 ml  18.80 ml/m LA Vol (A4C):   24.0 ml 11.34 ml/m LA Biplane Vol: 35.6 ml 16.82 ml/m  AORTIC VALVE AV Area (Vmax):    1.60 cm AV Area (Vmean):   1.49 cm AV Area (VTI):     1.64 cm AV Vmax:           131.13 cm/s AV Vmean:          87.600 cm/s AV VTI:            0.303 m AV Peak Grad:      6.9 mmHg AV Mean Grad:      4.0 mmHg LVOT Vmax:         60.70 cm/s LVOT Vmean:        37.800 cm/s LVOT VTI:          0.143 m LVOT/AV VTI ratio: 0.47 MITRAL VALVE               TRICUSPID VALVE MV Area (PHT): 2.00 cm    TR Peak grad:   16.2 mmHg MV Decel Time: 379 msec    TR Vmax:        201.00 cm/s MV E velocity: 47.10 cm/s MV A velocity: 99.00 cm/s  SHUNTS MV E/A ratio:  0.48        Systemic VTI:  0.14 m                            Systemic Diam: 2.10 cm Nelva Bush MD Electronically signed by Nelva Bush MD Signature Date/Time: 07/07/2019/1:33:31 PM    Final    Disposition   Pt is being discharged home today in good condition.  Follow-up Plans &  Appointments    Follow-up Information    Theora Gianotti, NP Follow up on 07/14/2019.   Specialties: Nurse Practitioner, Cardiology, Radiology Why: at 825am  Contact information: [address] [place] [address] [phone]  Discharge Instructions    Amb Referral to Cardiac Rehabilitation   Complete by: As directed    Diagnosis:  Coronary Stents NSTEMI     After initial evaluation and assessments completed: Virtual Based Care may be provided alone or in conjunction with Phase 2 Cardiac Rehab based on patient barriers.: Yes   Call MD for:  difficulty breathing, headache or visual disturbances   Complete by: As directed    Call MD for:  extreme fatigue   Complete by: As directed    Call MD for:  hives   Complete by: As directed    Call MD for:  persistant dizziness or light-headedness   Complete by: As directed    Call MD for:  persistant nausea and vomiting   Complete by: As directed    Call MD for:  redness, tenderness, or signs of infection (pain, swelling, redness, odor or green/yellow discharge around incision site)   Complete by: As directed    Call MD for:  severe uncontrolled pain   Complete by: As directed    Call MD for:  temperature >100.4   Complete by: As directed    Diet - low sodium heart healthy   Complete by: As directed    Discharge instructions   Complete by: As directed    No driving for 5 days. No lifting over 5 lbs for 1 week. No sexual activity for 1 week. Keep procedure site clean & dry. If you notice increased pain, swelling, bleeding or pus, call/return!  You may shower, but no soaking baths/hot tubs/pools for 1 week.   PLEASE DO NOT MISS ANY DOSES OF YOUR PLAVIX!!!!! Also keep a log of you blood pressures and bring back to your follow up appt. Please call the office with any questions.   Patients taking blood thinners should generally stay away from medicines like ibuprofen, Advil, Motrin, naproxen, and  Aleve due to risk of stomach bleeding. You may take Tylenol as directed or talk to your primary doctor about alternatives.  Some studies suggest Prilosec/Omeprazole interacts with Plavix.  If you have reflux symptoms please use Protonix for less chance of interaction.   Increase activity slowly   Complete by: As directed      Discharge Medications   Allergies as of 07/09/2019      Reactions   Metformin And Related Diarrhea, Other (See Comments)   Leg cramps, also   Azithromycin Other (See Comments)   Not recommended - no reaction   Other Other (See Comments)   If taking antibiotics for awhile, thrush results   Percocet [oxycodone-acetaminophen] Other (See Comments)   Hallucinations      Medication List    STOP taking these medications   naproxen sodium 220 MG tablet Commonly known as: ALEVE     TAKE these medications   acetaminophen 650 MG CR tablet Commonly known as: TYLENOL Take 650 mg by mouth 2 (two) times daily as needed (for pain).   aspirin EC 81 MG tablet Take 81 mg by mouth daily. What changed: Another medication with the same name was removed. Continue taking this medication, and follow the directions you see here.   carvedilol 3.125 MG tablet Commonly known as: COREG Take 3.125 mg by mouth 2 (two) times daily with a meal.   clopidogrel 75 MG tablet Commonly known as: PLAVIX Take 1 tablet (75 mg total) by mouth daily with breakfast. Start taking on: July 10, 2019   ezetimibe 10 MG tablet Commonly known as: ZETIA TAKE 1  TABLET(10 MG) BY MOUTH DAILY What changed: See the new instructions.   FISH OIL PO Take 1 capsule by mouth daily at 12 noon.   fluticasone 50 MCG/ACT nasal spray Commonly known as: FLONASE Place 2 sprays into both nostrils daily. What changed:   when to take this  reasons to take this   glipiZIDE 5 MG 24 hr tablet Commonly known as: GLUCOTROL XL Take 1 tablet (5 mg total) by mouth 2 (two) times daily. With food What changed:     when to take this  additional instructions   hydrochlorothiazide 25 MG tablet Commonly known as: HYDRODIURIL Take 25 mg by mouth daily with breakfast.   hydrocortisone cream 1 % Apply 1 application topically See admin instructions. Apply to affected area/rash every 3 days   ipratropium 0.06 % nasal spray Commonly known as: ATROVENT Place 2 sprays into both nostrils 3 (three) times daily.   lisinopril 10 MG tablet Commonly known as: ZESTRIL TAKE 1 TABLET BY MOUTH DAILY What changed: when to take this   multivitamin capsule Take 1 capsule by mouth daily with lunch.   MURINE TEARS FOR DRY EYES OP Place 1-2 drops into both eyes as needed (for dry eyes).   nitroGLYCERIN 0.4 MG SL tablet Commonly known as: NITROSTAT Place 1 tablet (0.4 mg total) under the tongue every 5 (five) minutes x 3 doses as needed for chest pain.   Ozempic (0.25 or 0.5 MG/DOSE) 2 MG/1.5ML Sopn Generic drug: Semaglutide(0.25 or 0.5MG /DOS) Inject 1 mg into the skin once a week. What changed: when to take this   pantoprazole 40 MG tablet Commonly known as: PROTONIX TAKE 1 TABLET BY MOUTH TWICE DAILY What changed: when to take this   psyllium 58.6 % powder Commonly known as: METAMUCIL Take 1 packet by mouth at bedtime as needed (if no roughage is consumed that day- Mix and drink).   rosuvastatin 40 MG tablet Commonly known as: CRESTOR Take 1 tablet (40 mg total) by mouth at bedtime. What changed:   medication strength  how much to take   sodium chloride 0.65 % Soln nasal spray Commonly known as: OCEAN Place 1 spray into both nostrils as needed for congestion.   tamsulosin 0.4 MG Caps capsule Commonly known as: FLOMAX TAKE 1 CAPSULE(0.4 MG) BY MOUTH DAILY What changed:   how much to take  how to take this  when to take this  additional instructions   traMADol 50 MG tablet Commonly known as: ULTRAM TAKE 1 TABLET BY MOUTH EVERY 12 HOURS AS NEEDED What changed: when to take this        Outstanding Labs/Studies   BMET   Duration of Discharge Encounter   Greater than 30 minutes including physician time.  Signed, Kathyrn Drown, NP 07/09/2019, 11:26 AM   ATTENDING ATTESTATION  I have seen, examined and evaluated the patient this AM on rounds along with Kathyrn Drown, NP-C.  After reviewing all the available data and chart, we discussed the patients laboratory, study & physical findings as well as symptoms in detail. I agree with her findings, examination as well as impression recommendations & summary as per our discussion.    PE performed by me.  Agree with summary - OK for d/c as he has been seen by EP.    Glenetta Hew, M.D., M.S. Interventional Cardiologist   Pager # [phone] Phone # [phone] [address] [place], [place] [address]

## 2019-07-12 ENCOUNTER — Encounter: Payer: Self-pay | Admitting: Family

## 2019-07-12 ENCOUNTER — Telehealth: Payer: Self-pay | Admitting: *Deleted

## 2019-07-12 ENCOUNTER — Telehealth: Payer: Self-pay

## 2019-07-12 ENCOUNTER — Ambulatory Visit (INDEPENDENT_AMBULATORY_CARE_PROVIDER_SITE_OTHER): Payer: Medicare Other | Admitting: Family

## 2019-07-12 ENCOUNTER — Ambulatory Visit: Payer: Medicare Other | Admitting: Pharmacist

## 2019-07-12 ENCOUNTER — Other Ambulatory Visit: Payer: Self-pay

## 2019-07-12 VITALS — BP 98/72 | HR 63 | Temp 97.8°F | Ht 72.99 in | Wt 203.0 lb

## 2019-07-12 DIAGNOSIS — I25118 Atherosclerotic heart disease of native coronary artery with other forms of angina pectoris: Secondary | ICD-10-CM

## 2019-07-12 DIAGNOSIS — E118 Type 2 diabetes mellitus with unspecified complications: Secondary | ICD-10-CM

## 2019-07-12 DIAGNOSIS — E782 Mixed hyperlipidemia: Secondary | ICD-10-CM | POA: Diagnosis not present

## 2019-07-12 DIAGNOSIS — E1165 Type 2 diabetes mellitus with hyperglycemia: Secondary | ICD-10-CM

## 2019-07-12 DIAGNOSIS — I10 Essential (primary) hypertension: Secondary | ICD-10-CM

## 2019-07-12 DIAGNOSIS — I214 Non-ST elevation (NSTEMI) myocardial infarction: Secondary | ICD-10-CM | POA: Diagnosis not present

## 2019-07-12 MED ORDER — EMPAGLIFLOZIN 10 MG PO TABS: 10.0000 mg | ORAL_TABLET | Freq: Every day | ORAL | 1 refills | Status: DC

## 2019-07-12 NOTE — Assessment & Plan Note (Signed)
Stable, continue regimen. 

## 2019-07-12 NOTE — Progress Notes (Signed)
Subjective:    Patient ID: [name] [name], male    DOB: [birth year], 79 y.o.   MRN: [id number]  CC: [name] [name] is a 79 y.o. male who presents today for follow up.   HPI: Feels well. No complaints Started back walking 5 minutes per day Accompanied by wife No cp, sob.  Had cold sweats, left jaw pain, since resolved.  Never felt palpitations, dizziness. No fatigue.   Dm- 7 a1c. Compliant with glipizide, ozempic,  No recurrent UTIs Spoke with Catie, pharmD this morning in regards to discontinuing glipizide, starting Jardiance  HLD- on atorvastatin 10 mg. Concerned with increasing to atorvastatin 40mg .   HTN- blood pressure at home 118/78, 78, 133 High 111-52 on his HR with fitbit  Elevated AST 47  ( prior 63)  Recent left ulnar fracture- following with [facility]  admitted 07/07/2019 for chest pain, NSTEMI.   Cardiac catheterization revealed severe two-vessel coronary artery disease.  Transferred to [facility] PCI of LAD.  Aggressive secondary prevention with plavix, asa 81mg  Echo 07/07/19 - ef 45-50 Discharge from [facility] 07/09/2019, Dr. Ellyn Hack, EP, Dr Curt Bears S/p atherectomy, multiple high-pressure balloon inflations with ultimate DES placement, stent After 12 months, consider plavix monotherapy Beta-blocker initially held given bradycardia.  Continue ACE inhibitor. LDL 55; continue statin and Zetia SVT-plan is to follow closely with bradycardia however this was prior to revascularization plan for monitoring heart rate, blood pressures at home.  Discharge improved to 60-80 post intervention.  Cardiology follow-up, Sharolyn Douglas 07/14/2019 HIS TORY:  Past Medical History:  Diagnosis Date  . Aortic insufficiency    a. noted on TTE 2015  . Arthritis   . CAD (coronary artery disease)    a. remote PCI in 1991 and 2005; b. MV 3/15: old inferior MI, no ischemia, LVEF 50%, slight inferior wall hypokniesis  . Chicken pox   . Colon polyps    4 pre-cancerous   . Diverticulitis     . DM type 2 (diabetes mellitus, type 2) ([name])   . Family history of adverse reaction to anesthesia   . GERD (gastroesophageal reflux disease)   . Heart murmur   . History of kidney stones   . HOH (hard of hearing)   . Hyperlipidemia   . Hypertension   . Kidney stones   . Melanoma ([name]) 1980   Resected from his back  . Mitral regurgitation    a. noted on TTE 2015  . Myocardial infarction ([place])   . Systolic dysfunction    a. TTE 2015: EF  50-55%, mild global hypokinesis, mild to moderate aortic sclerosis, mild aortic insufficiency, mild to moderate mitral regurgitation, mild tricuspid regurgitation, moderately dilated left atrium, mildly dilated right ventricle, PASP 31 mmHg   Past Surgical History:  Procedure Laterality Date  . CHOLECYSTECTOMY  2012  . COLONOSCOPY     in 2003 with polyp removed and leak anastomosis had to have open abdominal surgery   . CORONARY ANGIOPLASTY WITH STENT PLACEMENT  1991 & 2005  . CORONARY ATHERECTOMY N/A 07/08/2019   Procedure: CORONARY ATHERECTOMY;  Surgeon: Jettie Booze, MD;  Location: [facility] [facility];  Service: Cardiovascular;  Laterality: N/A;  . CORONARY BALLOON ANGIOPLASTY N/A 07/08/2019   Procedure: CORONARY BALLOON ANGIOPLASTY;  Surgeon: Jettie Booze, MD;  Location: [facility] [place] [facility];  Service: Cardiovascular;  Laterality: N/A;  diagonal   . CORONARY STENT INTERVENTION N/A 07/08/2019   Procedure: CORONARY STENT INTERVENTION;  Surgeon: Jettie Booze, MD;  Location: [place] [facility];  Service: Cardiovascular;  Laterality: N/A;  lad  . CYSTOSCOPY/URETEROSCOPY/HOLMIUM LASER/STENT PLACEMENT Right 01/02/2018   Procedure: CYSTOSCOPY/URETEROSCOPY/HOLMIUM LASER/STENT PLACEMENT;  Surgeon: [name], MD;  Location: [facility];  Service: Urology;  Laterality: Right;  . ESOPHAGOGASTRODUODENOSCOPY (EGD) WITH PROPOFOL N/A 11/19/2016   Procedure: ESOPHAGOGASTRODUODENOSCOPY (EGD) WITH PROPOFOL;  Surgeon: [name], MD;   Location: [facility];  Service: Endoscopy;  Laterality: N/A;  . ESOPHAGOGASTRODUODENOSCOPY (EGD) WITH PROPOFOL N/A 02/02/2019   Procedure: ESOPHAGOGASTRODUODENOSCOPY (EGD) WITH PROPOFOL;  Surgeon: [name], MD;  Location: [facility];  Service: Endoscopy;  Laterality: N/A;  . INTRAVASCULAR PRESSURE WIRE/FFR STUDY N/A 07/07/2019   Procedure: INTRAVASCULAR PRESSURE WIRE/FFR STUDY;  Surgeon: Nelva Bush, MD;  Location: [facility] [place];  Service: Cardiovascular;  Laterality: N/A;  . INTRAVASCULAR ULTRASOUND/IVUS N/A 07/08/2019   Procedure: Intravascular Ultrasound/IVUS;  Surgeon: Jettie Booze, MD;  Location: [place];  Service: Cardiovascular;  Laterality: N/A;  . LEFT HEART CATH N/A 07/08/2019   Procedure: Left Heart Cath;  Surgeon: Jettie Booze, MD;  Location: [facility];  Service: Cardiovascular;  Laterality: N/A;  . LEFT HEART CATH AND CORONARY ANGIOGRAPHY N/A 07/07/2019   Procedure: LEFT HEART CATH AND CORONARY ANGIOGRAPHY;  Surgeon: Nelva Bush, MD;  Location: [facility];  Service: Cardiovascular;  Laterality: N/A;  . LITHOTRIPSY  2015  . MELANOMA EXCISION  1980   malignant  . NERVE SURGERY  2015   ulna nerve  . TONSILLECTOMY  1945  . WISDOM TOOTH EXTRACTION     Family History  Problem Relation Age of Onset  . Hyperlipidemia Mother   . Hypertension Mother   . Heart disease Mother   . Diabetes Mother   . Heart attack Mother   . Colon cancer Father   . Lung cancer Father   . Kidney cancer Father        malignant capsulated kidney tumor  . Diabetes Father   . Liver cancer Father   . Bladder Cancer Neg Hx   . Prostate cancer Neg Hx     Allergies: Metformin and related, Azithromycin, Other, and Percocet [oxycodone-acetaminophen] Current Outpatient Medications on File Prior to Visit  Medication Sig Dispense Refill  . acetaminophen (TYLENOL) 650 MG CR tablet Take 650 mg by mouth 2 (two) times daily as needed (for  pain).     [name] aspirin EC 81 MG tablet Take 81 mg by mouth daily.     . carvedilol (COREG) 3.125 MG tablet Take 3.125 mg by mouth 2 (two) times daily with a meal.    . clopidogrel (PLAVIX) 75 MG tablet Take 1 tablet (75 mg total) by mouth daily with breakfast. 60 tablet 2  . ezetimibe (ZETIA) 10 MG tablet TAKE 1 TABLET(10 MG) BY MOUTH DAILY (Patient taking differently: Take 10 mg by mouth at bedtime. ) 90 tablet 0  . fluticasone (FLONASE) 50 MCG/ACT nasal spray Place 2 sprays into both nostrils daily. (Patient taking differently: Place 2 sprays into both nostrils daily as needed (for seasonal allergies). ) 16 g 6  . hydrochlorothiazide (HYDRODIURIL) 25 MG tablet Take 25 mg by mouth daily with breakfast.    . hydrocortisone cream 1 % Apply 1 application topically See admin instructions. Apply to affected area/rash every 3 days    . ipratropium (ATROVENT) 0.06 % nasal spray Place 2 sprays into both nostrils 3 (three) times daily. 15 mL 12  . lisinopril (ZESTRIL) 10 MG tablet TAKE 1 TABLET BY MOUTH DAILY (Patient taking differently: Take 10 mg  by mouth. ) 90 tablet 1  . Multiple Vitamin (MULTIVITAMIN) capsule Take 1 capsule by mouth daily with lunch.     . nitroGLYCERIN (NITROSTAT) 0.4 MG SL tablet Place 1 tablet (0.4 mg total) under the tongue every 5 (five) minutes x 3 doses as needed for chest pain. 25 tablet 0  . Omega-3 Fatty Acids (FISH OIL PO) Take 1 capsule by mouth daily at 12 noon.     . pantoprazole (PROTONIX) 40 MG tablet TAKE 1 TABLET BY MOUTH TWICE DAILY (Patient taking differently: Take 40 mg by mouth daily before breakfast. ) 180 tablet 0  . Polyvinyl Alcohol-Povidone (MURINE TEARS FOR DRY EYES OP) Place 1-2 drops into both eyes as needed (for dry eyes).     . psyllium (METAMUCIL) 58.6 % powder Take 1 packet by mouth at bedtime as needed (if no roughage is consumed that day- Mix and drink).     . rosuvastatin (CRESTOR) 40 MG tablet Take 1 tablet (40 mg total) by mouth at bedtime. 60 tablet  2  . Semaglutide,0.25 or 0.5MG /DOS, (OZEMPIC, 0.25 OR 0.5 MG/DOSE,) 2 MG/1.5ML SOPN Inject 1 mg into the skin once a week. (Patient taking differently: Inject 1 mg into the skin every Sunday. ) 3 pen 3  . sodium chloride (OCEAN) 0.65 % SOLN nasal spray Place 1 spray into both nostrils as needed for congestion.    . tamsulosin (FLOMAX) 0.4 MG CAPS capsule TAKE 1 CAPSULE(0.4 MG) BY MOUTH DAILY (Patient taking differently: Take 0.4 mg by mouth daily with lunch. ) 90 capsule 3  . traMADol (ULTRAM) 50 MG tablet TAKE 1 TABLET BY MOUTH EVERY 12 HOURS AS NEEDED (Patient taking differently: Take 50 mg by mouth every 12 (twelve) hours. ) 60 tablet 1   No current facility-administered medications on file prior to visit.    Social History   Tobacco Use  . Smoking status: Current Some Day Smoker    Types: Pipe  . Smokeless tobacco: Former Systems developer    Quit date: 10/17/2015  Vaping Use  . Vaping Use: Never used  Substance Use Topics  . Alcohol use: Yes    Alcohol/week: 0.0 - 1.0 standard drinks    Comment: none last 24hrs  . Drug use: No    Review of Systems  Constitutional: Negative for chills, diaphoresis, fatigue and fever.  Respiratory: Negative for cough and shortness of breath.   Cardiovascular: Negative for chest pain, palpitations and leg swelling.  Gastrointestinal: Negative for nausea and vomiting.  Neurological: Negative for dizziness and headaches.      Objective:    BP 98/72   Pulse 63   Temp 97.8 F (36.6 C)   Ht 6' 0.99" (1.854 m)   Wt 203 lb (92.1 kg)   SpO2 94%   BMI 26.79 kg/m  BP Readings from Last 3 Encounters:  07/12/19 98/72  07/09/19 136/73  07/07/19 (!) 157/99   Wt Readings from Last 3 Encounters:  07/12/19 203 lb (92.1 kg)  07/09/19 200 lb 6.4 oz (90.9 kg)  07/07/19 197 lb (89.4 kg)    Physical Exam Vitals reviewed.  Constitutional:      Appearance: He is well-developed.  Cardiovascular:     Rate and Rhythm: Regular rhythm.     Heart sounds: Normal  heart sounds.  Pulmonary:     Effort: Pulmonary effort is normal. No respiratory distress.     Breath sounds: Normal breath sounds. No wheezing, rhonchi or rales.  Skin:    General: Skin is warm and  dry.  Neurological:     Mental Status: He is alert.  Psychiatric:        Speech: Speech normal.        Behavior: Behavior normal.        Assessment & Plan:   Problem List Items Addressed This Visit      Cardiovascular and Mediastinum   Essential hypertension    Stable, continue regimen.       NSTEMI (non-ST elevated myocardial infarction) ([place]) - Primary    Medications reconciled.  Hospitalization reviewed . discussed patient staying on beta-blocker after NSTEMI with continue close vigilance of heart rate.  Patient feels  well today, no chest pain, shortness of breath, jaw pain or diaphoresis.  He is follow-up with cardiology in 2 days. Will follow        Endocrine   Controlled type 2 diabetes mellitus with complication, without long-term current use of insulin ([place])    Pleased with improvement.  We will stop glipizide and start Jardiance with additional benefit of cardiovascular disease prevention.  Have given him samples of Jardiance today.      Relevant Medications   empagliflozin (JARDIANCE) 10 MG TABS tablet     Other   Mixed hyperlipidemia    Well-controlled.  After NSTEMI, patient was advised to increase atorvastatin to 40 mg, he has some hesitation due to his history of elevated liver enzymes ( trending down).   Advised him to further discuss it with cardiology in regards standard of care s/p NSTEMI, stent.           I have discontinued [name] "Ben"'s glipiZIDE. I am also having him start on empagliflozin. Additionally, I am having him maintain his aspirin EC, fluticasone, Omega-3 Fatty Acids (FISH OIL PO), multivitamin, psyllium, Polyvinyl Alcohol-Povidone (MURINE TEARS FOR DRY EYES OP), acetaminophen, ipratropium, tamsulosin, pantoprazole, lisinopril,  Ozempic (0.25 or 0.5 MG/DOSE), ezetimibe, traMADol, carvedilol, sodium chloride, hydrochlorothiazide, hydrocortisone cream, nitroGLYCERIN, rosuvastatin, and clopidogrel.   Meds ordered this encounter  Medications  . empagliflozin (JARDIANCE) 10 MG TABS tablet    Sig: Take 1 tablet (10 mg total) by mouth daily before breakfast.    Dispense:  90 tablet    Refill:  1    Order Specific Question:   Supervising Provider    Answer:   Crecencio Mc [[id number]]    Return precautions given.   Risks, benefits, and alternatives of the medications and treatment plan prescribed today were discussed, and patient expressed understanding.   Education regarding symptom management and diagnosis given to patient on AVS.  Continue to follow with [name], [name] for routine health maintenance.   [name] and I agreed with plan.   [name], [name]

## 2019-07-12 NOTE — Chronic Care Management (AMB) (Signed)
Chronic Care Management   Follow Up Note   07/12/2019 Name: [name] [name] MRN: [id number] DOB: November 21, 1940  Referred by: [name], [name] Reason for referral : Chronic Care Management (Medication Management)   Chesley Veasey is a 79 y.o. year old male who is a primary care patient of [name], [name]. The CCM team was consulted for assistance with chronic disease management and care coordination needs.    Review of patient status, including review of consultants reports, relevant laboratory and other test results, and collaboration with appropriate care team members and the patient's provider was performed as part of comprehensive patient evaluation and provision of chronic care management services.    SDOH (Social Determinants of Health) assessments performed: No See Care Plan activities for detailed interventions related to [name] [name])     Outpatient Encounter Medications as of 07/12/2019  Medication Sig  . aspirin EC 81 MG tablet Take 81 mg by mouth daily.   . clopidogrel (PLAVIX) 75 MG tablet Take 1 tablet (75 mg total) by mouth daily with breakfast.  . rosuvastatin (CRESTOR) 40 MG tablet Take 1 tablet (40 mg total) by mouth at bedtime.  [name] acetaminophen (TYLENOL) 650 MG CR tablet Take 650 mg by mouth 2 (two) times daily as needed (for pain).   . carvedilol (COREG) 3.125 MG tablet Take 3.125 mg by mouth 2 (two) times daily with a meal.  . ezetimibe (ZETIA) 10 MG tablet TAKE 1 TABLET(10 MG) BY MOUTH DAILY (Patient taking differently: Take 10 mg by mouth at bedtime. )  . fluticasone (FLONASE) 50 MCG/ACT nasal spray Place 2 sprays into both nostrils daily. (Patient taking differently: Place 2 sprays into both nostrils daily as needed (for seasonal allergies). )  . glipiZIDE (GLUCOTROL XL) 5 MG 24 hr tablet Take 1 tablet (5 mg total) by mouth 2 (two) times daily. With food (Patient taking differently: Take 5 mg by mouth 2 (two) times daily with a meal. )  . hydrochlorothiazide  (HYDRODIURIL) 25 MG tablet Take 25 mg by mouth daily with breakfast.  . hydrocortisone cream 1 % Apply 1 application topically See admin instructions. Apply to affected area/rash every 3 days  . ipratropium (ATROVENT) 0.06 % nasal spray Place 2 sprays into both nostrils 3 (three) times daily. (Patient not taking: Reported on 07/07/2019)  . lisinopril (ZESTRIL) 10 MG tablet TAKE 1 TABLET BY MOUTH DAILY (Patient taking differently: Take 10 mg by mouth. )  . Multiple Vitamin (MULTIVITAMIN) capsule Take 1 capsule by mouth daily with lunch.   . nitroGLYCERIN (NITROSTAT) 0.4 MG SL tablet Place 1 tablet (0.4 mg total) under the tongue every 5 (five) minutes x 3 doses as needed for chest pain.  . Omega-3 Fatty Acids (FISH OIL PO) Take 1 capsule by mouth daily at 12 noon.   . pantoprazole (PROTONIX) 40 MG tablet TAKE 1 TABLET BY MOUTH TWICE DAILY (Patient taking differently: Take 40 mg by mouth daily before breakfast. )  . Polyvinyl Alcohol-Povidone (MURINE TEARS FOR DRY EYES OP) Place 1-2 drops into both eyes as needed (for dry eyes).   . psyllium (METAMUCIL) 58.6 % powder Take 1 packet by mouth at bedtime as needed (if no roughage is consumed that day- Mix and drink).   . Semaglutide,0.25 or 0.5MG /DOS, (OZEMPIC, 0.25 OR 0.5 MG/DOSE,) 2 MG/1.5ML SOPN Inject 1 mg into the skin once a week. (Patient taking differently: Inject 1 mg into the skin every Sunday. )  . sodium chloride (OCEAN) 0.65 % SOLN nasal spray Place 1  spray into both nostrils as needed for congestion.  . tamsulosin (FLOMAX) 0.4 MG CAPS capsule TAKE 1 CAPSULE(0.4 MG) BY MOUTH DAILY (Patient taking differently: Take 0.4 mg by mouth daily with lunch. )  . traMADol (ULTRAM) 50 MG tablet TAKE 1 TABLET BY MOUTH EVERY 12 HOURS AS NEEDED (Patient taking differently: Take 50 mg by mouth every 12 (twelve) hours. )   No facility-administered encounter medications on file as of 07/12/2019.     Objective:   Goals Addressed              This Visit's  Progress     Patient Stated   .  "I want to work on my blood sugars" (pt-stated)        [name] [name] (see longtitudinal plan of care for additional care plan information)  Current Barriers:  . Diabetes: at goal; most recent R7E 0.8%; complicated by CAD, BPH, recent hx NSTEMI o Recent hospitalization for NSTEMI 6/23-6/25; s/p coronary arthrectomy, DES placement to LAD o Request refill fax for Ozempic today . Current antihyperglycemic regimen: Ozempic 1 mg weekly; glipizide XR 5 mg BID currently   o APPROVED for Ozempic assistance through [employer] through 01/14/20 o Has been unable to tolerate metformin, IR and ER, d/t diarrhea. Notes he developed diarrhea after a few years of tolerating metformin, but diarrhea did improve after metformin was discontinued. . Cardiovascular risk reduction: o Current hypertensive regimen: HCTZ 25 mg QAM, lisinopril 10 mg QAM o Current hyperlipidemia regimen: rosuvastatin 40 mg daily (just increased at hospitalization from 10 mg daily), ezetimibe 10 mg daily; LDL at goal <70 (was 55) on rosuvastatin 10 mg daily + ezetimibe o Current antiplatelet therapy: ASA 81 mg daily, clopidogrel 75 mg daily (DAPT x 12 months then clopidogrel monotherapy)  Pharmacist Clinical Goal(s):  [name] Over the next 90 days, patient will work with PharmD and primary care provider to address optimized medication management  Interventions: . Comprehensive medication review performed, medication list updated in electronic medical record . Inter-disciplinary care team collaboration (see longitudinal plan of care) . Reviewed medication changes from hospitalization. Patient concerned about hx of LFTs on statin therapy, and noted that he had discussed d/c statin w/ GI previously. Reviewed that given recent NSTEMI, statin therapy is imperative. Encouraged to discuss f/u lab (LFT and lipids) w/ cardiology provider at [name] f/u appointment later this week.  . Reviewed importance of DAPT.  Encouraged adherence. . Reviewed cardiovascular, HF, and CKD benefits of SGLT2 therapy. Would recommend adding SGLT2 and d/c glipizide d/t risk of hypoglycemia. Discussed polyuria side effect, but that given patient's current level of sugar control, diuretic effect may not be as pronounced than if we had started SGLT2 months ago. Will discuss w/ PCP. Can start patient on samples to evaluate tolerability. May not qualify for SGLT2 assistance based on income, unless he hits the donut hole.   Patient Self Care Activities:  . Patient will check blood glucose BID , document, and provide at future appointments . Patient will take medications as prescribed . Patient will report any questions or concerns to provider   Please see past updates related to this goal by clicking on the "Past Updates" button in the selected goal          Plan:  - Scheduled f/u call in ~ 6 weeks  [name], PharmD, [place], [place] Pharmacist Winfred Killdeer [phone]

## 2019-07-12 NOTE — Patient Instructions (Addendum)
[name] [name],   It was great talking with you today! Here is what we discussed:   1) Changing from moderate intensity (rosuvastatin 10 mg daily) to high intensity (rosuvastatin 40 mg daily) is something we always do after someone has a heart attack. I recommend discussing your liver concerns with the cardiologist, and see his plan for monitoring your liver enzymes after this dose increase. Also ask him about ezetimibe - depending on how well your LDL is on this higher dose of rosuvastatin, we may not need you to take ezetimibe forever.   2) Let's strongly consider switching glipizide to Jardiance. Talk to [name] about this today. Unfortunately, your income is slightly over income for [place] assistance from the manufacturer. However, they do have an appeals process if your income gets significantly high (ie, if you hit the donut hole). I would rather you have the long term benefits (reduction in risk of heart disease, heart failure exacerbation, or progression of kidney disease) from [place] than have the risk of low blood sugars with Jardiance.  Call me with any questions!  [name], PharmD [phone]  Visit Information  Goals Addressed              This Visit's Progress     Patient Stated   .  "I want to work on my blood sugars" (pt-stated)        [place] (see longtitudinal plan of care for additional care plan information)  Current Barriers:  . Diabetes: at goal; most recent I9S 8.5%; complicated by CAD, BPH, recent hx NSTEMI o Recent hospitalization for NSTEMI 6/23-6/25; s/p coronary arthrectomy, DES placement to LAD o Request refill fax for Ozempic today . Current antihyperglycemic regimen: Ozempic 1 mg weekly; glipizide XR 5 mg BID currently   o APPROVED for Ozempic assistance through [employer] through 01/14/20 o Has been unable to tolerate metformin, IR and ER, d/t diarrhea. Notes he developed diarrhea after a few years of tolerating metformin, but  diarrhea did improve after metformin was discontinued. . Cardiovascular risk reduction: o Current hypertensive regimen: HCTZ 25 mg QAM, lisinopril 10 mg QAM o Current hyperlipidemia regimen: rosuvastatin 40 mg daily (just increased at hospitalization from 10 mg daily), ezetimibe 10 mg daily; LDL at goal <70 (was 55) on rosuvastatin 10 mg daily + ezetimibe o Current antiplatelet therapy: ASA 81 mg daily, clopidogrel 75 mg daily (DAPT x 12 months then clopidogrel monotherapy)  Pharmacist Clinical Goal(s):  [name] Over the next 90 days, patient will work with PharmD and primary care provider to address optimized medication management  Interventions: . Comprehensive medication review performed, medication list updated in electronic medical record . Inter-disciplinary care team collaboration (see longitudinal plan of care) . Reviewed medication changes from hospitalization. Patient concerned about hx of LFTs on statin therapy, and noted that he had discussed d/c statin w/ GI previously. Reviewed that given recent NSTEMI, statin therapy is imperative. Encouraged to discuss f/u lab (LFT and lipids) w/ cardiology provider at hospital f/u appointment later this week.  . Reviewed importance of DAPT. Encouraged adherence. . Reviewed cardiovascular, HF, and CKD benefits of SGLT2 therapy. Would recommend adding SGLT2 and d/c glipizide d/t risk of hypoglycemia. Discussed polyuria side effect, but that given patient's current level of sugar control, diuretic effect may not be as pronounced than if we had started SGLT2 months ago. Will discuss w/ PCP. Can start patient on samples to evaluate tolerability. May not qualify for SGLT2 assistance based on income, unless he hits the  donut hole.   Patient Self Care Activities:  . Patient will check blood glucose BID , document, and provide at future appointments . Patient will take medications as prescribed . Patient will report any questions or concerns to provider    Please see past updates related to this goal by clicking on the "Past Updates" button in the selected goal         The patient verbalized understanding of instructions provided today and agreed to receive a mailed copy of patient instruction and/or educational materials.  Plan:  - Scheduled f/u call in ~ 6 weeks  [name], PharmD, [place], [place] Pharmacist [place] [phone]

## 2019-07-12 NOTE — Telephone Encounter (Signed)
Patient contacted regarding discharge from [facility] on 07/09/19.  Patient understands to follow up with provider Murray Hodgkins, NP on Wednesday 07/14/19 at 8:25 pm at the [facility] office. Patient understands discharge instructions? yes Patient understands medications and regiment? yes Patient understands to bring all medications to this visit? yes

## 2019-07-12 NOTE — Telephone Encounter (Signed)
No tcm qualifications. Transition of care follow up with specialty cardiology.

## 2019-07-12 NOTE — Telephone Encounter (Signed)
Medication Samples have been provided to the patient.  Drug name: Jardiance      Strength: 10 mg        Qty: 7  LOT: [id number]  Exp.Date: 06/2021 4 boxes total Dosing instructions: Take 1 Tablet( 10 mg) once daily.  The patient has been instructed regarding the correct time, dose, and frequency of taking this medication, including desired effects and most common side effects.   Kerin Salen 3:28 PM 07/12/2019

## 2019-07-12 NOTE — Patient Instructions (Signed)
Stop glipizide  Start jardiance   Empagliflozin oral tablets What is this medicine? EMPAGLIFLOZIN (EM pa gli FLOE zin) helps to treat type 2 diabetes. It helps to control blood sugar. This drug may also reduce the risk of heart attack or stroke if you have type 2 diabetes and risk factors for heart disease. Treatment is combined with diet and exercise. This medicine may be used for other purposes; ask your health care provider or pharmacist if you have questions. COMMON BRAND NAME(S): Jardiance What should I tell my health care provider before I take this medicine? They need to know if you have any of these conditions:  dehydration  diabetic ketoacidosis  diet low in salt  eating less due to illness, surgery, dieting, or any other reason  having surgery  high cholesterol  high levels of potassium in the blood  history of pancreatitis or pancreas problems  history of yeast infection of the penis or vagina  if you often drink alcohol  infections in the bladder, kidneys, or urinary tract  kidney disease  liver disease  low blood pressure  on hemodialysis  problems urinating  type 1 diabetes  uncircumcised male  an unusual or allergic reaction to empagliflozin, other medicines, foods, dyes, or preservatives  pregnant or trying to get pregnant  breast-feeding How should I use this medicine? Take this medicine by mouth with a glass of water. Follow the directions on the prescription label. Take it in the morning, with or without food. Take your dose at the same time each day. Do not take more often than directed. Do not stop taking except on your doctor's advice. Talk to your pediatrician regarding the use of this medicine in children. Special care may be needed. Overdosage: If you think you have taken too much of this medicine contact a poison control center or emergency room at once. NOTE: This medicine is only for you. Do not share this medicine with  others. What if I miss a dose? If you miss a dose, take it as soon as you can. If it is almost time for your next dose, take only that dose. Do not take double or extra doses. What may interact with this medicine? Do not take this medicine with any of the following medications:  gatifloxacin This medicine may also interact with the following medications:  alcohol  certain medicines for blood pressure, heart disease  diuretics This list may not describe all possible interactions. Give your health care provider a list of all the medicines, herbs, non-prescription drugs, or dietary supplements you use. Also tell them if you smoke, drink alcohol, or use illegal drugs. Some items may interact with your medicine. What should I watch for while using this medicine? Visit your doctor or health care professional for regular checks on your progress. This medicine can cause a serious condition in which there is too much acid in the blood. If you develop nausea, vomiting, stomach pain, unusual tiredness, or breathing problems, stop taking this medicine and call your doctor right away. If possible, use a ketone dipstick to check for ketones in your urine. A test called the HbA1C (A1C) will be monitored. This is a simple blood test. It measures your blood sugar control over the last 2 to 3 months. You will receive this test every 3 to 6 months. Learn how to check your blood sugar. Learn the symptoms of low and high blood sugar and how to manage them. Always carry a quick-source of sugar with you  in case you have symptoms of low blood sugar. Examples include hard sugar candy or glucose tablets. Make sure others know that you can choke if you eat or drink when you develop serious symptoms of low blood sugar, such as seizures or unconsciousness. They must get medical help at once. Tell your doctor or health care professional if you have high blood sugar. You might need to change the dose of your medicine. If you  are sick or exercising more than usual, you might need to change the dose of your medicine. Do not skip meals. Ask your doctor or health care professional if you should avoid alcohol. Many nonprescription cough and cold products contain sugar or alcohol. These can affect blood sugar. Wear a medical ID bracelet or chain, and carry a card that describes your disease and details of your medicine and dosage times. What side effects may I notice from receiving this medicine? Side effects that you should report to your doctor or health care professional as soon as possible:  allergic reactions like skin rash, itching or hives, swelling of the face, lips, or tongue  breathing problems  dizziness  feeling faint or lightheaded, falls  muscle weakness  nausea, vomiting, unusual stomach upset or pain  penile discharge, itching, or pain in men  signs and symptoms of a genital infection, such as fever; tenderness, redness, or swelling in the genitals or area from the genitals to the back of the rectum  signs and symptoms of low blood sugar such as feeling anxious, confusion, dizziness, increased hunger, unusually weak or tired, sweating, shakiness, cold, irritable, headache, blurred vision, fast heartbeat, loss of consciousness  signs and symptoms of a urinary tract infection, such as fever, chills, a burning feeling when urinating, blood in the urine, back pain  trouble passing urine or change in the amount of urine, including an urgent need to urinate more often, in larger amounts, or at night  unusual tiredness  vaginal discharge, itching, or odor in women Side effects that usually do not require medical attention (report to your doctor or health care professional if they continue or are bothersome):  mild increase in urination  thirsty This list may not describe all possible side effects. Call your doctor for medical advice about side effects. You may report side effects to FDA at  1-800-FDA-1088. Where should I keep my medicine? Keep out of the reach of children. Store at room temperature between 20 and 25 degrees C (68 and 77 degrees F). Throw away any unused medicine after the expiration date. NOTE: This sheet is a summary. It may not cover all possible information. If you have questions about this medicine, talk to your doctor, pharmacist, or health care provider.  2020 Elsevier/Gold Standard (2016-09-12 10:25:34)

## 2019-07-12 NOTE — Assessment & Plan Note (Signed)
Well-controlled.  After NSTEMI, patient was advised to increase atorvastatin to 40 mg, he has some hesitation due to his history of elevated liver enzymes ( trending down).   Advised him to further discuss it with cardiology in regards standard of care s/p NSTEMI, stent.

## 2019-07-12 NOTE — Assessment & Plan Note (Signed)
Medications reconciled.  Hospitalization reviewed . discussed patient staying on beta-blocker after NSTEMI with continue close vigilance of heart rate.  Patient feels  well today, no chest pain, shortness of breath, jaw pain or diaphoresis.  He is follow-up with cardiology in 2 days. Will follow

## 2019-07-12 NOTE — Progress Notes (Signed)
Agree with plan. Lovina Zuver, NP  

## 2019-07-12 NOTE — Assessment & Plan Note (Signed)
Pleased with improvement.  We will stop glipizide and start Jardiance with additional benefit of cardiovascular disease prevention.  Have given him samples of Jardiance today.

## 2019-07-14 ENCOUNTER — Other Ambulatory Visit: Payer: Self-pay

## 2019-07-14 ENCOUNTER — Other Ambulatory Visit: Payer: Self-pay | Admitting: Family

## 2019-07-14 ENCOUNTER — Encounter: Payer: Self-pay | Admitting: Nurse Practitioner

## 2019-07-14 ENCOUNTER — Ambulatory Visit: Payer: Medicare Other | Admitting: Nurse Practitioner

## 2019-07-14 VITALS — BP 124/68 | HR 54 | Ht 72.0 in | Wt 200.5 lb

## 2019-07-14 DIAGNOSIS — I10 Essential (primary) hypertension: Secondary | ICD-10-CM | POA: Diagnosis not present

## 2019-07-14 DIAGNOSIS — I472 Ventricular tachycardia, unspecified: Secondary | ICD-10-CM

## 2019-07-14 DIAGNOSIS — I255 Ischemic cardiomyopathy: Secondary | ICD-10-CM | POA: Diagnosis not present

## 2019-07-14 DIAGNOSIS — E785 Hyperlipidemia, unspecified: Secondary | ICD-10-CM | POA: Diagnosis not present

## 2019-07-14 DIAGNOSIS — G8929 Other chronic pain: Secondary | ICD-10-CM

## 2019-07-14 DIAGNOSIS — I2510 Atherosclerotic heart disease of native coronary artery without angina pectoris: Secondary | ICD-10-CM

## 2019-07-14 DIAGNOSIS — I214 Non-ST elevation (NSTEMI) myocardial infarction: Secondary | ICD-10-CM | POA: Diagnosis not present

## 2019-07-14 NOTE — Patient Instructions (Signed)
Medication Instructions:  Your physician recommends that you continue on your current medications as directed. Please refer to the Current Medication list given to you today.  *If you need a refill on your cardiac medications before your next appointment, please call your pharmacy*   Lab Work: None ordered -Please be prepared for labs at your next visit.  If you have labs (blood work) drawn today and your tests are completely normal, you will receive your results only by: [name] MyChart Message (if you have MyChart) OR . A paper copy in the mail If you have any lab test that is abnormal or we need to change your treatment, we will call you to review the results.   Testing/Procedures: None ordered   Follow-Up: At [facility], you and your health needs are our priority.  As part of our continuing mission to provide you with exceptional heart care, we have created designated Provider Care Teams.  These Care Teams include your primary Cardiologist (physician) and Advanced Practice Providers (APPs -  Physician Assistants and Nurse Practitioners) who all work together to provide you with the care you need, when you need it.  We recommend signing up for the patient portal called "MyChart".  Sign up information is provided on this After Visit Summary.  MyChart is used to connect with patients for Virtual Visits (Telemedicine).  Patients are able to view lab/test results, encounter notes, upcoming appointments, etc.  Non-urgent messages can be sent to your provider as well.   To learn more about what you can do with MyChart, go to [email or web address].    Your next appointment:   4-6 week(s)  The format for your next appointment:   In Person  Provider:    You may see Ida Rogue, MD or Murray Hodgkins, NP

## 2019-07-14 NOTE — Telephone Encounter (Signed)
Refill request for tramadol, last seen 07-12-19, last filled 05-14-19.  Please advise.

## 2019-07-14 NOTE — Progress Notes (Signed)
Office Visit    Patient Name: [name] [name] Date of Encounter: 07/14/2019  Primary Care Provider:  [name], [name] Primary Cardiologist:  Ida Rogue, MD  Chief Complaint    79 year old male with a history of CAD, hypertension, hyperlipidemia, type 2 diabetes mellitus, and ischemic cardiomyopathy, presents for follow-up after recent non-STEMI and PCI.  Past Medical History    Past Medical History:  Diagnosis Date  . Aortic insufficiency    a. noted on TTE 2015  . Arthritis   . CAD (coronary artery disease)    a. remote PCI in 1991 and 2005; b. MV 3/15: old inferior MI, no ischemia, LVEF 50%, slight inferior wall hypokniesis; c. 06/2019 NSTEMI/PCI: LM nl, LAD 80p/m (Atherectomy & 4.5x18 Resolute Onyx DES), 8m/d, D1 75 (PTCA), RI patent stent, LCX nl, RCA 100p, RPAV fills via L->R collats from LCX.  [name] Chicken pox   . Colon polyps    4 pre-cancerous   . Diverticulitis   . DM type 2 (diabetes mellitus, type 2) ([place])   . Family history of adverse reaction to anesthesia   . GERD (gastroesophageal reflux disease)   . Heart murmur   . History of kidney stones   . HOH (hard of hearing)   . Hyperlipidemia   . Hypertension   . Ischemic cardiomyopathy    a. TTE 2015: EF  50-55%, mild global HK; b. 06/2019 Echo: EF 45-50%, Gr1 DD, basal inf AK. Triv MR.  . Kidney stones   . Melanoma ([place]) 1980   Resected from his back  . Mitral regurgitation    a. noted on TTE 2015  . Myocardial infarction [facility])    Past Surgical History:  Procedure Laterality Date  . CHOLECYSTECTOMY  2012  . COLONOSCOPY     in 2003 with polyp removed and leak anastomosis had to have open abdominal surgery   . CORONARY ANGIOPLASTY WITH STENT PLACEMENT  1991 & 2005  . CORONARY ATHERECTOMY N/A 07/08/2019   Procedure: CORONARY ATHERECTOMY;  Surgeon: Jettie Booze, MD;  Location: [place] [facility];  Service: Cardiovascular;  Laterality: N/A;  . CORONARY BALLOON ANGIOPLASTY N/A 07/08/2019    Procedure: CORONARY BALLOON ANGIOPLASTY;  Surgeon: Jettie Booze, MD;  Location: [place] [facility];  Service: Cardiovascular;  Laterality: N/A;  diagonal   . CORONARY STENT INTERVENTION N/A 07/08/2019   Procedure: CORONARY STENT INTERVENTION;  Surgeon: Jettie Booze, MD;  Location: [place] [facility];  Service: Cardiovascular;  Laterality: N/A;  lad  . CYSTOSCOPY/URETEROSCOPY/HOLMIUM LASER/STENT PLACEMENT Right 01/02/2018   Procedure: CYSTOSCOPY/URETEROSCOPY/HOLMIUM LASER/STENT PLACEMENT;  Surgeon: [name], MD;  Location: [facility];  Service: Urology;  Laterality: Right;  . ESOPHAGOGASTRODUODENOSCOPY (EGD) WITH PROPOFOL N/A 11/19/2016   Procedure: ESOPHAGOGASTRODUODENOSCOPY (EGD) WITH PROPOFOL;  Surgeon: [name], MD;  Location: [facility];  Service: Endoscopy;  Laterality: N/A;  . ESOPHAGOGASTRODUODENOSCOPY (EGD) WITH PROPOFOL N/A 02/02/2019   Procedure: ESOPHAGOGASTRODUODENOSCOPY (EGD) WITH PROPOFOL;  Surgeon: [name], MD;  Location: [facility];  Service: Endoscopy;  Laterality: N/A;  . INTRAVASCULAR PRESSURE WIRE/FFR STUDY N/A 07/07/2019   Procedure: INTRAVASCULAR PRESSURE WIRE/FFR STUDY;  Surgeon: Nelva Bush, MD;  Location: [place] [facility];  Service: Cardiovascular;  Laterality: N/A;  . INTRAVASCULAR ULTRASOUND/IVUS N/A 07/08/2019   Procedure: Intravascular Ultrasound/IVUS;  Surgeon: Jettie Booze, MD;  Location: [place] [facility];  Service: Cardiovascular;  Laterality: N/A;  . LEFT HEART CATH N/A 07/08/2019   Procedure: Left Heart Cath;  Surgeon: Jettie Booze, MD;  Location: [place] [facility];  Service: Cardiovascular;  Laterality: N/A;  . LEFT HEART CATH AND CORONARY ANGIOGRAPHY N/A 07/07/2019   Procedure: LEFT HEART CATH AND CORONARY ANGIOGRAPHY;  Surgeon: Nelva Bush, MD;  Location: [place] [facility];  Service: Cardiovascular;  Laterality: N/A;  . LITHOTRIPSY  2015  . MELANOMA EXCISION  1980   malignant  . NERVE SURGERY   2015   ulna nerve  . TONSILLECTOMY  1945  . WISDOM TOOTH EXTRACTION      Allergies  Allergies  Allergen Reactions  . Metformin And Related Diarrhea and Other (See Comments)    Leg cramps, also  . Azithromycin Other (See Comments)    Not recommended - no reaction  . Other Other (See Comments)    If taking antibiotics for awhile, thrush results  . Percocet [Oxycodone-Acetaminophen] Other (See Comments)    Hallucinations    History of Present Illness    79 year old male with above complex past medical history including coronary artery disease status post prior interventions with PTCA 1991 and subsequent drug-eluting stent placement to the ramus intermedius in 2005.  Other history includes hypertension, hyperlipidemia, type 2 diabetes mellitus, melanoma, and ischemic cardiomyopathy.  He was recently admitted to [facility] regional after awaking with diaphoresis, weakness, and mild discomfort across his maxilla.  He noted that he had been awakening periodically over the prior 2 weeks with diaphoresis.  His blood pressure was in the 80s at home on the morning of admission, prompting him to present to the ED.  Initial triage ECG showed tachycardia at a rate of 172 felt to represent SVT with aberrancy vs VT.  After placing an IV, he became bradycardic with rates in the 40s-sinus bradycardia versus junctional rhythm.  Initial blood pressure was 68/49 and he required atropine with subsequent stabilization.  High-sensitivity troponin increased to 794.  Diagnostic catheterization showed a chronic total occlusion of the RCA and severe LAD and diagonal disease.  He was transferred to [facility] and underwent successful atherectomy of the LAD with placement of a drug-eluting stent.  The diagonal was treated with PTCA.  In the setting of wide-complex tachycardia, he was seen by electrophysiology who felt that wide-complex tachycardia represented VT in the setting of ischemia.  As EF was 45-50 on echo and he was  not have any recurrent arrhythmia, continue beta-blocker therapy was recommended.  Since hospital discharge, he has done well.  He denies chest pain or dyspnea.  He has been walking some with his wife without symptoms or limitations.  He notes that nocturnal diaphoretic spells have completely resolved.  He now says that during the 2 weeks prior to hospitalization he noted elevated heart rates on his watch occurring at night, sometimes with rates in the 160s.  This has completely resolved.  He denies PND, orthopnea, dizziness, syncope, edema, or early satiety.  Home Medications    Prior to Admission medications   Medication Sig Start Date End Date Taking? Authorizing Provider  acetaminophen (TYLENOL) 650 MG CR tablet Take 650 mg by mouth 2 (two) times daily as needed (for pain).     [provider]  aspirin EC 81 MG tablet Take 81 mg by mouth daily.     [provider]  carvedilol (COREG) 3.125 MG tablet Take 3.125 mg by mouth 2 (two) times daily with a meal.    [provider]  clopidogrel (PLAVIX) 75 MG tablet Take 1 tablet (75 mg total) by mouth daily with breakfast. 07/10/19   Glenford Peers,  Alphonsa Overall, NP  empagliflozin (JARDIANCE) 10 MG TABS tablet Take 1 tablet (10 mg total) by mouth daily before breakfast. 07/12/19   [name], [name]  ezetimibe (ZETIA) 10 MG tablet TAKE 1 TABLET(10 MG) BY MOUTH DAILY Patient taking differently: Take 10 mg by mouth at bedtime.  05/13/19   [name], MD  fluticasone (FLONASE) 50 MCG/ACT nasal spray Place 2 sprays into both nostrils daily. Patient taking differently: Place 2 sprays into both nostrils daily as needed (for seasonal allergies).  10/03/15   Coral Spikes, DO  hydrochlorothiazide (HYDRODIURIL) 25 MG tablet Take 25 mg by mouth daily with breakfast.    [provider]  hydrocortisone cream 1 % Apply 1 application topically See admin instructions. Apply to affected area/rash every 3 days    [provider]   ipratropium (ATROVENT) 0.06 % nasal spray Place 2 sprays into both nostrils 3 (three) times daily. 10/23/18   McLean-Scocuzza, Nino Glow, MD  lisinopril (ZESTRIL) 10 MG tablet TAKE 1 TABLET BY MOUTH DAILY Patient taking differently: Take 10 mg by mouth.  02/26/19   [name], [name]  Multiple Vitamin (MULTIVITAMIN) capsule Take 1 capsule by mouth daily with lunch.     [provider]  nitroGLYCERIN (NITROSTAT) 0.4 MG SL tablet Place 1 tablet (0.4 mg total) under the tongue every 5 (five) minutes x 3 doses as needed for chest pain. 07/09/19   Tommie Raymond, NP  Omega-3 Fatty Acids (FISH OIL PO) Take 1 capsule by mouth daily at 12 noon.     [provider]  pantoprazole (PROTONIX) 40 MG tablet TAKE 1 TABLET BY MOUTH TWICE DAILY Patient taking differently: Take 40 mg by mouth daily before breakfast.  02/08/19   [name], MD  Polyvinyl Alcohol-Povidone (MURINE TEARS FOR DRY EYES OP) Place 1-2 drops into both eyes as needed (for dry eyes).     [provider]  psyllium (METAMUCIL) 58.6 % powder Take 1 packet by mouth at bedtime as needed (if no roughage is consumed that day- Mix and drink).     [provider]  rosuvastatin (CRESTOR) 40 MG tablet Take 1 tablet (40 mg total) by mouth at bedtime. 07/09/19   [name], NP  Semaglutide,0.25 or 0.5MG /DOS, (OZEMPIC, 0.25 OR 0.5 MG/DOSE,) 2 MG/1.5ML SOPN Inject 1 mg into the skin once a week. Patient taking differently: Inject 1 mg into the skin every Sunday.  04/05/19   [name], [name]  sodium chloride (OCEAN) 0.65 % SOLN nasal spray Place 1 spray into both nostrils as needed for congestion.    [provider]  tamsulosin (FLOMAX) 0.4 MG CAPS capsule TAKE 1 CAPSULE(0.4 MG) BY MOUTH DAILY Patient taking differently: Take 0.4 mg by mouth daily with lunch.  10/27/18   [name], MD  traMADol (ULTRAM) 50 MG tablet TAKE 1 TABLET BY MOUTH EVERY 12 HOURS AS NEEDED Patient taking differently:  Take 50 mg by mouth every 12 (twelve) hours.  05/14/19   [name], [name]    Review of Systems    Doing well since hospitalization.  He denies chest pain, palpitations, dyspnea, pnd, orthopnea, n, v, dizziness, syncope, edema, weight gain, or early satiety.  All other systems reviewed and are otherwise negative except as noted above.  Physical Exam    VS:  BP 124/68 (BP Location: Left Arm, Patient Position: Sitting, Cuff Size: Normal)   Pulse (!) 54   Ht 6' (1.829 m)   Wt 200  lb 8 oz (90.9 kg)   SpO2 96%   BMI 27.19 kg/m  , BMI Body mass index is 27.19 kg/m. GEN: Well nourished, well developed, in no acute distress. HEENT: normal. Neck: Supple, no JVD, carotid bruits, or masses. Cardiac: RRR, no murmurs, rubs, or gallops. No clubbing, cyanosis, edema.  Radials/DP/PT 2+ and equal bilaterally.  Right radial catheterization site without bleeding, bruit, or hematoma.  Right forearm ecchymosis noted. Respiratory:  Respirations regular and unlabored, clear to auscultation bilaterally. GI: Soft, nontender, nondistended, BS + x 4. MS: no deformity or atrophy. Skin: warm and dry, no rash. Neuro:  Strength and sensation are intact. Psych: Normal affect.  Accessory Clinical Findings    ECG personally reviewed by me today -sinus bradycardia, 54, left axis deviation, IVCD, lateral T wave inversion- no acute changes.  Lab Results  Component Value Date   WBC 7.3 07/09/2019   HGB 14.6 07/09/2019   HCT 42.8 07/09/2019   MCV 88.4 07/09/2019   PLT 173 07/09/2019   Lab Results  Component Value Date   CREATININE 0.64 07/09/2019   BUN 10 07/09/2019   NA 137 07/09/2019   K 4.4 07/09/2019   CL 105 07/09/2019   CO2 23 07/09/2019   Lab Results  Component Value Date   ALT 41 07/07/2019   AST 47 (H) 07/07/2019   ALKPHOS 55 07/07/2019   BILITOT 2.0 (H) 07/07/2019   Lab Results  Component Value Date   CHOL 120 02/10/2019   HDL 39.00 (L) 02/10/2019   LDLCALC 70 03/02/2018    LDLDIRECT 55.0 02/10/2019   TRIG 215.0 (H) 02/10/2019   CHOLHDL 3 02/10/2019    Lab Results  Component Value Date   HGBA1C 7.0 (H) 07/07/2019    Assessment & Plan    1.  Non-STEMI, subsequent episode of care/coronary artery disease: Status post recent admission following a 2-week history of nocturnal diaphoretic spells with documented tachycardia on his Fitbit with more profound weakness and hypotension on the morning of admission.  He had left maxillary pain, which may be an anginal equivalent.  Catheterization revealed severe LAD and diagonal disease.  The LAD was treated with atherectomy and drug-eluting stent while the diagonal was treated with PTCA.  He remains on aspirin, beta-blocker, Plavix, Zetia, and statin therapy and is tolerating all.  He has not had any chest pain, dyspnea, maxillary pain, or nocturnal diaphoretic spells.  No documented tachycardia on his Fitbit since his PCI.  He is interested in cardiac rehabilitation.  2.  Wide-complex tachycardia/ventricular tachycardia: Upon arrival to the emergency department last week, patient was in a wide-complex tachycardia rate of 172.  There is question of SVT versus aberrancy versus VT.  This resolved spontaneously after starting an IV and did not recur during hospitalization.  In light of non-STEMI and CAD requiring LAD and diagonal intervention, electrophysiology felt that rhythm was most likely ventricular tachycardia with recommendation for beta-blockade only given ACS and EF of 45 to 50%.  Today, patient says that over the 2 weeks prior to his hospitalization, he was noting elevated heart rates only occurring at night, sometimes into the 160s.  Again, he was experiencing intermittent diaphoretic episodes at night.  I suspect the 2 are linked.  He has not had any recurrent tachycardia noted at night on his Fitbit.  I offered to place a ZIO monitor to further assess for recurrent tachycardia and rule out other arrhythmias as well.  For the  time being, we agreed that he would continue to  trend his heart rates at night and that if he sees any elevation, he will contact us at which point we will place a monitor.  He remains on beta-blocker therapy.  3.  Essential hypertension: Stable on beta-blocker and ACE inhibitor therapy.  4.  Ischemic cardiomyopathy: Euvolemic on examination.  Continue beta-blocker and ACE inhibitor.  EF 45-50% by echo during hospitalization.  5.  Hyperlipidemia: LDL was 55 in January on low-dose rosuvastatin and Zetia.  Despite this, he suffered ACS as outlined above.  In this setting, rosuvastatin dose was titrated to 40 mg daily.  We discussed this today as he has a history of mild LFT elevation.  We agreed to continue current dosing of lipid medications with a plan for follow-up lipids and LFTs in approximately 6 weeks.  If LFTs elevate significantly, we may be forced to scale back.  6.  Type 2 diabetes mellitus: Managed well by primary care.  Most recent A1c 7.0.  He was recently placed on Jardiance and he does have a concern about potentially developing yeast infections as he has a history of this with antibiotics.  He is willing to give it a try and will follow up with primary care.  7.  Disposition: Follow-up in clinic in 4 to 6 weeks, at which time we will repeat lipids and LFTs.  He will contact us if he notices elevated heart rates, at which time we will place a ZIO monitor.  Murray Hodgkins, NP 07/14/2019, 9:15 AM

## 2019-07-21 ENCOUNTER — Ambulatory Visit: Payer: Self-pay | Admitting: Pharmacist

## 2019-07-21 DIAGNOSIS — E118 Type 2 diabetes mellitus with unspecified complications: Secondary | ICD-10-CM

## 2019-07-21 DIAGNOSIS — I25118 Atherosclerotic heart disease of native coronary artery with other forms of angina pectoris: Secondary | ICD-10-CM

## 2019-07-21 NOTE — Chronic Care Management (AMB) (Signed)
Chronic Care Management   Follow Up Note   07/21/2019 Name: Dravon Nott MRN: [id number] DOB: [birth year]  Referred by: [name], [name] Reason for referral : Chronic Care Management (Medication Management)   Nazim Kadlec is a 79 y.o. year old male who is a primary care patient of [name], [name]. The CCM team was consulted for assistance with chronic disease management and care coordination needs.    Received call from patient with medication management questions.   Review of patient status, including review of consultants reports, relevant laboratory and other test results, and collaboration with appropriate care team members and the patient's provider was performed as part of comprehensive patient evaluation and provision of chronic care management services.    SDOH (Social Determinants of Health) assessments performed: No See Care Plan activities for detailed interventions related to [facility])     Outpatient Encounter Medications as of 07/21/2019  Medication Sig  . acetaminophen (TYLENOL) 650 MG CR tablet Take 650 mg by mouth 2 (two) times daily as needed (for pain).   [name] aspirin EC 81 MG tablet Take 81 mg by mouth daily.   . carvedilol (COREG) 3.125 MG tablet Take 3.125 mg by mouth 2 (two) times daily with a meal.  . clopidogrel (PLAVIX) 75 MG tablet Take 1 tablet (75 mg total) by mouth daily with breakfast.  . empagliflozin (JARDIANCE) 10 MG TABS tablet Take 1 tablet (10 mg total) by mouth daily before breakfast.  . ezetimibe (ZETIA) 10 MG tablet TAKE 1 TABLET(10 MG) BY MOUTH DAILY (Patient taking differently: Take 10 mg by mouth at bedtime. )  . fluticasone (FLONASE) 50 MCG/ACT nasal spray Place 2 sprays into both nostrils daily. (Patient taking differently: Place 2 sprays into both nostrils daily as needed (for seasonal allergies). )  . hydrochlorothiazide (HYDRODIURIL) 25 MG tablet Take 25 mg by mouth daily with breakfast.  . hydrocortisone cream 1 % Apply 1  application topically See admin instructions. Apply to affected area/rash every 3 days  . ipratropium (ATROVENT) 0.06 % nasal spray Place 2 sprays into both nostrils 3 (three) times daily.  [name] lisinopril (ZESTRIL) 10 MG tablet TAKE 1 TABLET BY MOUTH DAILY (Patient taking differently: Take 10 mg by mouth. )  . Multiple Vitamin (MULTIVITAMIN) capsule Take 1 capsule by mouth daily with lunch.   . nitroGLYCERIN (NITROSTAT) 0.4 MG SL tablet Place 1 tablet (0.4 mg total) under the tongue every 5 (five) minutes x 3 doses as needed for chest pain.  . Omega-3 Fatty Acids (FISH OIL PO) Take 1 capsule by mouth daily at 12 noon.   . pantoprazole (PROTONIX) 40 MG tablet TAKE 1 TABLET BY MOUTH TWICE DAILY (Patient taking differently: Take 40 mg by mouth daily before breakfast. )  . Polyvinyl Alcohol-Povidone (MURINE TEARS FOR DRY EYES OP) Place 1-2 drops into both eyes as needed (for dry eyes).   . psyllium (METAMUCIL) 58.6 % powder Take 1 packet by mouth at bedtime as needed (if no roughage is consumed that day- Mix and drink).   . rosuvastatin (CRESTOR) 40 MG tablet Take 1 tablet (40 mg total) by mouth at bedtime.  . Semaglutide,0.25 or 0.5MG /DOS, (OZEMPIC, 0.25 OR 0.5 MG/DOSE,) 2 MG/1.5ML SOPN Inject 1 mg into the skin once a week. (Patient taking differently: Inject 1 mg into the skin every Sunday. )  . sodium chloride (OCEAN) 0.65 % SOLN nasal spray Place 1 spray into both nostrils as needed for congestion.  . tamsulosin (FLOMAX) 0.4 MG CAPS capsule TAKE  1 CAPSULE(0.4 MG) BY MOUTH DAILY (Patient taking differently: Take 0.4 mg by mouth daily with lunch. )  . traMADol (ULTRAM) 50 MG tablet TAKE 1 TABLET BY MOUTH EVERY 12 HOURS AS NEEDED   No facility-administered encounter medications on file as of 07/21/2019.     Objective:   Goals Addressed              This Visit's Progress     Patient Stated   .  "I want to work on my blood sugars" (pt-stated)        [place] (see longtitudinal plan of  care for additional care plan information)  Current Barriers:  . Diabetes: at goal; most recent Z5G 3.8%; complicated by CAD, BPH, recent hx NSTEMI o Calls reporting tongue being sore, but notes that he may have bit down on his tongue. Denies any white patches that would indicate fungal infection. Notes muscle fatigue/tiredness, but also notes that he has gotten back into exercising. Notes fatigue is worse with starting exercise, but improves as he walks more . Current antihyperglycemic regimen: Ozempic 1 mg weekly; Jardiance 10 mg daily    o APPROVED for Ozempic assistance through [employer] through 01/14/20 o Has been unable to tolerate metformin, IR and ER, d/t diarrhea. Notes he developed diarrhea after a few years of tolerating metformin, but diarrhea did improve after metformin was discontinued. . Cardiovascular risk reduction: o Current hypertensive regimen: HCTZ 25 mg QAM, lisinopril 10 mg QAM o Current hyperlipidemia regimen: rosuvastatin 40 mg daily (just increased at hospitalization from 10 mg daily), ezetimibe 10 mg daily; LDL at goal <70 (was 55) on rosuvastatin 10 mg daily + ezetimibe o Current antiplatelet therapy: ASA 81 mg daily, clopidogrel 75 mg daily (DAPT x 12 months then clopidogrel monotherapy)  Pharmacist Clinical Goal(s):  [name] Over the next 90 days, patient will work with PharmD and primary care provider to address optimized medication management  Interventions: . Comprehensive medication review performed, medication list updated in electronic medical record . Inter-disciplinary care team collaboration (see longitudinal plan of care) . Discussed that symptoms sounds like they could be related to other things/insults, not medications. However, patient interested in trial off of Jardinace to see if any symptoms resolve. He will hold Jardiance for ~1 week. If no change in symptoms, he will restart. If sugars trend back up to fasting >150, he will restart glipizide.  .  Discussed that exercise is more likely causing muscle fatigue. Encouraged to continue to follow symptoms and to contact cardiology if worsening and he thinks statin regimen needs to be changed.   Patient Self Care Activities:  . Patient will check blood glucose BID , document, and provide at future appointments . Patient will take medications as prescribed . Patient will report any questions or concerns to provider   Please see past updates related to this goal by clicking on the "Past Updates" button in the selected goal          Plan:  - Will outreach as previously scheduled  [name], PharmD, [place], [place] Pharmacist [place] [phone]

## 2019-07-21 NOTE — Patient Instructions (Signed)
Visit Information  Goals Addressed              This Visit's Progress     Patient Stated   .  "I want to work on my blood sugars" (pt-stated)        [name] [name] (see longtitudinal plan of care for additional care plan information)  Current Barriers:  . Diabetes: at goal; most recent X7W 6.2%; complicated by CAD, BPH, recent hx NSTEMI o Calls reporting tongue being sore, but notes that he may have bit down on his tongue. Denies any white patches that would indicate fungal infection. Notes muscle fatigue/tiredness, but also notes that he has gotten back into exercising. Notes fatigue is worse with starting exercise, but improves as he walks more . Current antihyperglycemic regimen: Ozempic 1 mg weekly; Jardiance 10 mg daily    o APPROVED for Ozempic assistance through [employer] through 01/14/20 o Has been unable to tolerate metformin, IR and ER, d/t diarrhea. Notes he developed diarrhea after a few years of tolerating metformin, but diarrhea did improve after metformin was discontinued. . Cardiovascular risk reduction: o Current hypertensive regimen: HCTZ 25 mg QAM, lisinopril 10 mg QAM o Current hyperlipidemia regimen: rosuvastatin 40 mg daily (just increased at hospitalization from 10 mg daily), ezetimibe 10 mg daily; LDL at goal <70 (was 55) on rosuvastatin 10 mg daily + ezetimibe o Current antiplatelet therapy: ASA 81 mg daily, clopidogrel 75 mg daily (DAPT x 12 months then clopidogrel monotherapy)  Pharmacist Clinical Goal(s):  [name] Over the next 90 days, patient will work with PharmD and primary care provider to address optimized medication management  Interventions: . Comprehensive medication review performed, medication list updated in electronic medical record . Inter-disciplinary care team collaboration (see longitudinal plan of care) . Discussed that symptoms sounds like they could be related to other things/insults, not medications. However, patient interested in trial  off of Jardinace to see if any symptoms resolve. He will hold Jardiance for ~1 week. If no change in symptoms, he will restart. If sugars trend back up to fasting >150, he will restart glipizide.  . Discussed that exercise is more likely causing muscle fatigue. Encouraged to continue to follow symptoms and to contact cardiology if worsening and he thinks statin regimen needs to be changed.   Patient Self Care Activities:  . Patient will check blood glucose BID , document, and provide at future appointments . Patient will take medications as prescribed . Patient will report any questions or concerns to provider   Please see past updates related to this goal by clicking on the "Past Updates" button in the selected goal         The patient verbalized understanding of instructions provided today and declined a print copy of patient instruction materials.    Plan:  - Will outreach as previously scheduled  [name], PharmD, [place], [place] Pharmacist [place] [phone]

## 2019-07-22 NOTE — Progress Notes (Signed)
Agree with plan. Krystall Kruckenberg, NP  

## 2019-07-23 ENCOUNTER — Encounter: Payer: Medicare Other | Attending: Cardiovascular Disease | Admitting: *Deleted

## 2019-07-23 ENCOUNTER — Other Ambulatory Visit: Payer: Self-pay

## 2019-07-23 DIAGNOSIS — I214 Non-ST elevation (NSTEMI) myocardial infarction: Secondary | ICD-10-CM

## 2019-07-23 DIAGNOSIS — Z955 Presence of coronary angioplasty implant and graft: Secondary | ICD-10-CM

## 2019-07-23 NOTE — Progress Notes (Signed)
Initial telephone orientation completed. Diagnosis can be found in [facility] 6/24. EP orientation scheduled for 7/13 at 9am.

## 2019-07-27 ENCOUNTER — Other Ambulatory Visit: Payer: Self-pay

## 2019-07-27 ENCOUNTER — Encounter: Payer: Medicare Other | Admitting: *Deleted

## 2019-07-27 VITALS — Ht 72.4 in | Wt 202.3 lb

## 2019-07-27 DIAGNOSIS — Z955 Presence of coronary angioplasty implant and graft: Secondary | ICD-10-CM

## 2019-07-27 DIAGNOSIS — I214 Non-ST elevation (NSTEMI) myocardial infarction: Secondary | ICD-10-CM

## 2019-07-27 NOTE — Progress Notes (Signed)
Cardiac Individual Treatment Plan  Patient Details  Name: [name] [name] MRN: [id number] Date of Birth: 25-Apr-1940 Referring Provider:     Cardiac Rehab from 07/27/2019 in [facility] Cardiac and Pulmonary Rehab  Referring Provider Ida Rogue MD      Initial Encounter Date:    Cardiac Rehab from 07/27/2019 in [facility] Cardiac and Pulmonary Rehab  Date 07/27/19      Visit Diagnosis: NSTEMI (non-ST elevated myocardial infarction) [facility])  Status post coronary artery stent placement  Patient's Home Medications on Admission:  Current Outpatient Medications:  .  acetaminophen (TYLENOL) 650 MG CR tablet, Take 650 mg by mouth 2 (two) times daily as needed (for pain). , Disp: , Rfl:  .  aspirin EC 81 MG tablet, Take 81 mg by mouth daily. , Disp: , Rfl:  .  carvedilol (COREG) 3.125 MG tablet, Take 3.125 mg by mouth 2 (two) times daily with a meal., Disp: , Rfl:  .  clopidogrel (PLAVIX) 75 MG tablet, Take 1 tablet (75 mg total) by mouth daily with breakfast., Disp: 60 tablet, Rfl: 2 .  empagliflozin (JARDIANCE) 10 MG TABS tablet, Take 1 tablet (10 mg total) by mouth daily before breakfast. (Patient not taking: Reported on 07/23/2019), Disp: 90 tablet, Rfl: 1 .  ezetimibe (ZETIA) 10 MG tablet, TAKE 1 TABLET(10 MG) BY MOUTH DAILY (Patient taking differently: Take 10 mg by mouth at bedtime. ), Disp: 90 tablet, Rfl: 0 .  fluticasone (FLONASE) 50 MCG/ACT nasal spray, Place 2 sprays into both nostrils daily. (Patient taking differently: Place 2 sprays into both nostrils daily as needed (for seasonal allergies). ), Disp: 16 g, Rfl: 6 .  hydrochlorothiazide (HYDRODIURIL) 25 MG tablet, Take 25 mg by mouth daily with breakfast., Disp: , Rfl:  .  hydrocortisone cream 1 %, Apply 1 application topically See admin instructions. Apply to affected area/rash every 3 days, Disp: , Rfl:  .  ipratropium (ATROVENT) 0.06 % nasal spray, Place 2 sprays into both nostrils 3 (three) times daily., Disp: 15 mL, Rfl: 12 .   lisinopril (ZESTRIL) 10 MG tablet, TAKE 1 TABLET BY MOUTH DAILY (Patient taking differently: Take 10 mg by mouth. ), Disp: 90 tablet, Rfl: 1 .  Multiple Vitamin (MULTIVITAMIN) capsule, Take 1 capsule by mouth daily with lunch. , Disp: , Rfl:  .  nitroGLYCERIN (NITROSTAT) 0.4 MG SL tablet, Place 1 tablet (0.4 mg total) under the tongue every 5 (five) minutes x 3 doses as needed for chest pain., Disp: 25 tablet, Rfl: 0 .  Omega-3 Fatty Acids (FISH OIL PO), Take 1 capsule by mouth daily at 12 noon. , Disp: , Rfl:  .  pantoprazole (PROTONIX) 40 MG tablet, TAKE 1 TABLET BY MOUTH TWICE DAILY (Patient taking differently: Take 40 mg by mouth daily before breakfast. ), Disp: 180 tablet, Rfl: 0 .  Polyvinyl Alcohol-Povidone (MURINE TEARS FOR DRY EYES OP), Place 1-2 drops into both eyes as needed (for dry eyes). , Disp: , Rfl:  .  psyllium (METAMUCIL) 58.6 % powder, Take 1 packet by mouth at bedtime as needed (if no roughage is consumed that day- Mix and drink). , Disp: , Rfl:  .  rosuvastatin (CRESTOR) 40 MG tablet, Take 1 tablet (40 mg total) by mouth at bedtime., Disp: 60 tablet, Rfl: 2 .  Semaglutide,0.25 or 0.'5MG'$ /DOS, (OZEMPIC, 0.25 OR 0.5 MG/DOSE,) 2 MG/1.5ML SOPN, Inject 1 mg into the skin once a week. (Patient taking differently: Inject 1 mg into the skin every Sunday. ), Disp: 3 pen, Rfl: 3 .  sodium chloride (OCEAN) 0.65 % SOLN nasal spray, Place 1 spray into both nostrils as needed for congestion., Disp: , Rfl:  .  tamsulosin (FLOMAX) 0.4 MG CAPS capsule, TAKE 1 CAPSULE(0.4 MG) BY MOUTH DAILY (Patient taking differently: Take 0.4 mg by mouth daily with lunch. ), Disp: 90 capsule, Rfl: 3 .  traMADol (ULTRAM) 50 MG tablet, TAKE 1 TABLET BY MOUTH EVERY 12 HOURS AS NEEDED, Disp: 60 tablet, Rfl: 1  Past Medical History: Past Medical History:  Diagnosis Date  . Aortic insufficiency    a. noted on TTE 2015  . Arthritis   . CAD (coronary artery disease)    a. remote PCI in 1991 and 2005; b. MV 3/15: old  inferior MI, no ischemia, LVEF 50%, slight inferior wall hypokniesis; c. 06/2019 NSTEMI/PCI: LM nl, LAD 80p/m (Atherectomy & 4.5x18 Resolute Onyx DES), 82md, D1 75 (PTCA), RI patent stent, LCX nl, RCA 100p, RPAV fills via L->R collats from LCX.  .Marland KitchenChicken pox   . Colon polyps    4 pre-cancerous   . Diverticulitis   . DM type 2 (diabetes mellitus, type 2) ([place]   . Family history of adverse reaction to anesthesia   . GERD (gastroesophageal reflux disease)   . Heart murmur   . History of kidney stones   . HOH (hard of hearing)   . Hyperlipidemia   . Hypertension   . Ischemic cardiomyopathy    a. TTE 2015: EF  50-55%, mild global HK; b. 06/2019 Echo: EF 45-50%, Gr1 DD, basal inf AK. Triv MR.  . Kidney stones   . [place]   Resected from his back  . Mitral regurgitation    a. noted on TTE 2015  . Myocardial infarction ([facility] [place]     Tobacco Use: Social History   Tobacco Use  Smoking Status Current Some Day Smoker  . Types: Pipe  Smokeless Tobacco Former [employer] developer . Quit date: 10/17/2015    Labs: Recent Review Flowsheet Data    Labs for ITP Cardiac and Pulmonary Rehab Latest Ref Rng & Units 03/02/2018 06/03/2018 09/28/2018 02/10/2019 07/07/2019   Cholestrol 0 - 200 mg/dL 136 - - 120 -   LDLCALC 0 - 99 mg/dL 70 - - - -   LDLDIRECT mg/dL - - - 55.0 -   HDL >39.00 mg/dL 36.50(L) - - 39.00(L) -   Trlycerides 0 - 149 mg/dL 143.0 - - 215.0(H) -   Hemoglobin A1c 4.8 - 5.6 % 7.4(H) 7.8(H) 8.2(H) 7.9(H) 7.0(H)       Exercise Target Goals: Exercise Program Goal: Individual exercise prescription set using results from initial 6 min walk test and THRR while considering  patient's activity barriers and safety.   Exercise Prescription Goal: Initial exercise prescription builds to 30-45 minutes a day of aerobic activity, 2-3 days per week.  Home exercise guidelines will be given to patient during program as part of exercise prescription that the participant will acknowledge.   Education:  Aerobic Exercise & Resistance Training: - Gives group verbal and written instruction on the various components of exercise. Focuses on aerobic and resistive training programs and the benefits of this training and how to safely progress through these programs..   Education: Exercise & Equipment Safety: - Individual verbal instruction and demonstration of equipment use and safety with use of the equipment.   [facility]  [facility] 07/27[facility]  Educator [facility] Instruction Review Code 1- Verbalizes Understanding  Education: Exercise Physiology & General Exercise Guidelines: - Group verbal and written instruction with models to review the exercise physiology of the cardiovascular system and associated critical values. Provides general exercise guidelines with specific guidelines to those with heart or lung disease.    Cardiac Rehab from 07/27/2019 in [facility] Cardiac and Pulmonary Rehab  Date 07/27/19  Instruction Review Code 3- Needs Reinforcement  [need identified]      Education: Flexibility, Balance, Mind/Body Relaxation: Provides group verbal/written instruction on the benefits of flexibility and balance training, including mind/body exercise modes such as yoga, pilates and tai chi.  Demonstration and skill practice provided.   Activity Barriers & Risk Stratification:  Activity Barriers & Cardiac Risk Stratification - 07/27/19 1117      Activity Barriers & Cardiac Risk Stratification   Activity Barriers Arthritis;Joint Problems;Neck/Spine Problems;Deconditioning;Muscular Weakness;Balance Concerns   previously dislocated shoulders, broke thumb 2 months ago   Cardiac Risk Stratification High           6 Minute Walk:  6 Minute Walk    Row Name 07/27/19 1116         6 Minute Walk   Phase Initial     Distance 1110 feet     Walk Time 6 minutes     # of Rest Breaks 0     MPH 2.1     METS 2.11     RPE 9     VO2 Peak 7.37     Symptoms  No     Resting HR 81 bpm     Resting BP 112/64     Resting Oxygen Saturation  97 %     Exercise Oxygen Saturation  during 6 min walk 97 %     Max Ex. HR 97 bpm     Max Ex. BP 124/66     2 Minute Post BP 114/72            Oxygen Initial Assessment:   Oxygen Re-Evaluation:   Oxygen Discharge (Final Oxygen Re-Evaluation):   Initial Exercise Prescription:  Initial Exercise Prescription - 07/27/19 1100      Date of Initial Exercise RX and Referring Provider   Date 07/27/19    Referring Provider Julien Nordmann MD      Treadmill   MPH 2.1    Grade 0.5    Minutes 15    METs 2.75      NuStep   Level 2    SPM 80    Minutes 15    METs 2      T5 Nustep   Level 2    SPM 80    Minutes 15    METs 2      Biostep-RELP   Level 2    SPM 50    Minutes 15    METs 2      Prescription Details   Frequency (times per week) 2    Duration Progress to 30 minutes of continuous aerobic without signs/symptoms of physical distress      Intensity   THRR 40-80% of Max Heartrate 105-129    Ratings of Perceived Exertion 11-13    Perceived Dyspnea 0-4      Progression   Progression Continue to progress workloads to maintain intensity without signs/symptoms of physical distress.      Resistance Training   Training Prescription Yes    Weight 3 lb    Reps 10-15           Perform Capillary Blood Glucose checks  as needed.  Exercise Prescription Changes:  Exercise Prescription Changes    Row Name 07/27/19 1100             Response to Exercise   Blood Pressure (Admit) 112/64       Blood Pressure (Exercise) 124/66       Blood Pressure (Exit) 114/72       Heart Rate (Admit) 81 bpm       Heart Rate (Exercise) 97 bpm       Heart Rate (Exit) 78 bpm       Oxygen Saturation (Admit) 97 %       Oxygen Saturation (Exercise) 97 %       Rating of Perceived Exertion (Exercise) 9       Symptoms none       Comments walk test results              Exercise  Comments:   Exercise Goals and Review:  Exercise Goals    Row Name 07/27/19 1119             Exercise Goals   Increase Physical Activity Yes       Intervention Develop an individualized exercise prescription for aerobic and resistive training based on initial evaluation findings, risk stratification, comorbidities and participant's personal goals.;Provide advice, education, support and counseling about physical activity/exercise needs.       Expected Outcomes Short Term: Attend rehab on a regular basis to increase amount of physical activity.;Long Term: Add in home exercise to make exercise part of routine and to increase amount of physical activity.;Long Term: Exercising regularly at least 3-5 days a week.       Increase Strength and Stamina Yes       Intervention Provide advice, education, support and counseling about physical activity/exercise needs.;Develop an individualized exercise prescription for aerobic and resistive training based on initial evaluation findings, risk stratification, comorbidities and participant's personal goals.       Expected Outcomes Short Term: Increase workloads from initial exercise prescription for resistance, speed, and METs.;Short Term: Perform resistance training exercises routinely during rehab and add in resistance training at home;Long Term: Improve cardiorespiratory fitness, muscular endurance and strength as measured by increased METs and functional capacity (6MWT)       Able to understand and use rate of perceived exertion (RPE) scale Yes       Intervention Provide education and explanation on how to use RPE scale       Expected Outcomes Short Term: Able to use RPE daily in rehab to express subjective intensity level;Long Term:  Able to use RPE to guide intensity level when exercising independently       Able to understand and use Dyspnea scale Yes       Intervention Provide education and explanation on how to use Dyspnea scale       Expected Outcomes  Short Term: Able to use Dyspnea scale daily in rehab to express subjective sense of shortness of breath during exertion;Long Term: Able to use Dyspnea scale to guide intensity level when exercising independently       Knowledge and understanding of Target Heart Rate Range (THRR) Yes       Intervention Provide education and explanation of THRR including how the numbers were predicted and where they are located for reference       Expected Outcomes Short Term: Able to state/look up THRR;Short Term: Able to use daily as guideline for intensity in rehab;Long Term: Able to use THRR  to govern intensity when exercising independently       Able to check pulse independently Yes       Intervention Provide education and demonstration on how to check pulse in carotid and radial arteries.;Review the importance of being able to check your own pulse for safety during independent exercise       Expected Outcomes Short Term: Able to explain why pulse checking is important during independent exercise;Long Term: Able to check pulse independently and accurately       Understanding of Exercise Prescription Yes       Intervention Provide education, explanation, and written materials on patient's individual exercise prescription       Expected Outcomes Short Term: Able to explain program exercise prescription;Long Term: Able to explain home exercise prescription to exercise independently              Exercise Goals Re-Evaluation :   Discharge Exercise Prescription (Final Exercise Prescription Changes):  Exercise Prescription Changes - 07/27/19 1100      Response to Exercise   Blood Pressure (Admit) 112/64    Blood Pressure (Exercise) 124/66    Blood Pressure (Exit) 114/72    Heart Rate (Admit) 81 bpm    Heart Rate (Exercise) 97 bpm    Heart Rate (Exit) 78 bpm    Oxygen Saturation (Admit) 97 %    Oxygen Saturation (Exercise) 97 %    Rating of Perceived Exertion (Exercise) 9    Symptoms none    Comments walk  test results           Nutrition:  Target Goals: Understanding of nutrition guidelines, daily intake of sodium '1500mg'$ , cholesterol '200mg'$ , calories 30% from fat and 7% or less from saturated fats, daily to have 5 or more servings of fruits and vegetables.  Education: Controlling Sodium/Reading Food Labels -Group verbal and written material supporting the discussion of sodium use in heart healthy nutrition. Review and explanation with models, verbal and written materials for utilization of the food label.   Education: General Nutrition Guidelines/Fats and Fiber: -Group instruction provided by verbal, written material, models and posters to present the general guidelines for heart healthy nutrition. Gives an explanation and review of dietary fats and fiber.   Cardiac Rehab from 07/27/2019 in [facility] Cardiac and Pulmonary Rehab  Date 07/27/19  Instruction Review Code 3- Needs Reinforcement  [need identified]      Biometrics:  Pre Biometrics - 07/27/19 1120      Pre Biometrics   Height 6' 0.4" (1.839 m)    Weight 202 lb 4.8 oz (91.8 kg)    BMI (Calculated) 27.13    Single Leg Stand 5.85 seconds            Nutrition Therapy Plan and Nutrition Goals:   Nutrition Assessments:  Nutrition Assessments - 07/27/19 1120      MEDFICTS Scores   Pre Score 40           MEDIFICTS Score Key:          ?70 Need to make dietary changes          40-70 Heart Healthy Diet         ? 40 Therapeutic Level Cholesterol Diet  Nutrition Goals Re-Evaluation:   Nutrition Goals Discharge (Final Nutrition Goals Re-Evaluation):   Psychosocial: Target Goals: Acknowledge presence or absence of significant depression and/or stress, maximize coping skills, provide positive support system. Participant is able to verbalize types and ability to use techniques and skills needed for reducing  stress and depression.   Education: Depression - Provides group verbal and written instruction on the  correlation between heart/lung disease and depressed mood, treatment options, and the stigmas associated with seeking treatment.   Education: Sleep Hygiene -Provides group verbal and written instruction about how sleep can affect your health.  Define sleep hygiene, discuss sleep cycles and impact of sleep habits. Review good sleep hygiene tips.     Education: Stress and Anxiety: - Provides group verbal and written instruction about the health risks of elevated stress and causes of high stress.  Discuss the correlation between heart/lung disease and anxiety and treatment options. Review healthy ways to manage with stress and anxiety.    Initial Review & Psychosocial Screening:  Initial Psych Review & Screening - 07/23/19 0937      Initial Review   Current issues with None Identified      Family Dynamics   Good Support System? Yes   wife and dog     Barriers   Psychosocial barriers to participate in program There are no identifiable barriers or psychosocial needs.      Screening Interventions   Interventions Encouraged to exercise;To provide support and resources with identified psychosocial needs;Provide feedback about the scores to participant    Expected Outcomes Short Term goal: Utilizing psychosocial counselor, staff and physician to assist with identification of specific Stressors or current issues interfering with healing process. Setting desired goal for each stressor or current issue identified.;Long Term Goal: Stressors or current issues are controlled or eliminated.;Short Term goal: Identification and review with participant of any Quality of Life or Depression concerns found by scoring the questionnaire.;Long Term goal: The participant improves quality of Life and PHQ9 Scores as seen by post scores and/or verbalization of changes           Quality of Life Scores:   Quality of Life - 07/27/19 1120      Quality of Life   Select Quality of Life      Quality of Life  Scores   Health/Function Pre 20.67 %    Socioeconomic Pre 25 %    Psych/Spiritual Pre 20.21 %    Family Pre 18.6 %    GLOBAL Pre 21.05 %          Scores of 19 and below usually indicate a poorer quality of life in these areas.  A difference of  2-3 points is a clinically meaningful difference.  A difference of 2-3 points in the total score of the Quality of Life Index has been associated with significant improvement in overall quality of life, self-image, physical symptoms, and general health in studies assessing change in quality of life.  PHQ-9: Recent Review Flowsheet Data    Depression screen [facility] 2/9 07/27/2019 07/12/2019 05/17/2019 10/06/2018 05/15/2017   Decreased Interest 0 0 0 0 0   Down, Depressed, Hopeless 1 0 0 0 0   PHQ - 2 Score 1 0 0 0 0   Altered sleeping 1 - - - -   Tired, decreased energy 1 - - - -   Change in appetite 0 - - - -   Feeling bad or failure about yourself  0 - - - -   Trouble concentrating 1 - - - -   Moving slowly or fidgety/restless 0 - - - -   Suicidal thoughts 0 - - - -   PHQ-9 Score 4 - - - -   Difficult doing work/chores Somewhat difficult - - - -  Interpretation of Total Score  Total Score Depression Severity:  1-4 = Minimal depression, 5-9 = Mild depression, 10-14 = Moderate depression, 15-19 = Moderately severe depression, 20-27 = Severe depression   Psychosocial Evaluation and Intervention:  Psychosocial Evaluation - 07/23/19 0952      Psychosocial Evaluation & Interventions   Comments Suezanne Jacquet reports doing well after his NSTEMI. He has started walking again, taking it easy, and looking forward to starting Cardiac Rehab. His wife is very supportive and he also enjoys time spent with their dog. He did not report any stress concerns and is sleeping well.    Expected Outcomes Short: attend cardiac rehab for education and exercise. Long: maintain positive self care habits.    Continue Psychosocial Services  Follow up required by staff            Psychosocial Re-Evaluation:   Psychosocial Discharge (Final Psychosocial Re-Evaluation):   Vocational Rehabilitation: Provide vocational rehab assistance to qualifying candidates.   Vocational Rehab Evaluation & Intervention:  Vocational Rehab - 07/23/19 0937      Initial Vocational Rehab Evaluation & Intervention   Assessment shows need for Vocational Rehabilitation No           Education: Education Goals: Education classes will be provided on a variety of topics geared toward better understanding of heart health and risk factor modification. Participant will state understanding/return demonstration of topics presented as noted by education test scores.  Learning Barriers/Preferences:  Learning Barriers/Preferences - 07/23/19 9509      Learning Barriers/Preferences   Learning Barriers None    Learning Preferences None           General Cardiac Education Topics:  AED/CPR: - Group verbal and written instruction with the use of models to demonstrate the basic use of the AED with the basic ABC's of resuscitation.   Anatomy & Physiology of the Heart: - Group verbal and written instruction and models provide basic cardiac anatomy and physiology, with the coronary electrical and arterial systems. Review of Valvular disease and Heart Failure   Cardiac Rehab from 07/27/2019 in [facility] Cardiac and Pulmonary Rehab  Date 07/27/19  Instruction Review Code 3- Needs Reinforcement  [need identified]      Cardiac Procedures: - Group verbal and written instruction to review commonly prescribed medications for heart disease. Reviews the medication, class of the drug, and side effects. Includes the steps to properly store meds and maintain the prescription regimen. (beta blockers and nitrates)   Cardiac Medications I: - Group verbal and written instruction to review commonly prescribed medications for heart disease. Reviews the medication, class of the drug, and side effects.  Includes the steps to properly store meds and maintain the prescription regimen.   Cardiac Medications II: -Group verbal and written instruction to review commonly prescribed medications for heart disease. Reviews the medication, class of the drug, and side effects. (all other drug classes)    Go Sex-Intimacy & Heart Disease, Get SMART - Goal Setting: - Group verbal and written instruction through game format to discuss heart disease and the return to sexual intimacy. Provides group verbal and written material to discuss and apply goal setting through the application of the S.M.A.R.T. Method.   Other Matters of the Heart: - Provides group verbal, written materials and models to describe Stable Angina and Peripheral Artery. Includes description of the disease process and treatment options available to the cardiac patient.   Infection Prevention: - Provides verbal and written material to individual with discussion of infection control including  proper hand washing and proper equipment cleaning during exercise session.   Cardiac Rehab from 07/27/2019 in [facility] Cardiac and Pulmonary Rehab  Date 07/27/19  Educator [facility]  Instruction Review Code 1- Verbalizes Understanding      Falls Prevention: - Provides verbal and written material to individual with discussion of falls prevention and safety.   Cardiac Rehab from 07/27/2019 in [facility] Cardiac and Pulmonary Rehab  Date 07/27/19  Educator [facility]  Instruction Review Code 1- Verbalizes Understanding      Other: -Provides group and verbal instruction on various topics (see comments)   Knowledge Questionnaire Score:  Knowledge Questionnaire Score - 07/27/19 1120      Knowledge Questionnaire Score   Pre Score 21/26 Education Focus: Pulse, A&P, heart failure, Exercise, Nutrition           Core Components/Risk Factors/Patient Goals at Admission:  Personal Goals and Risk Factors at Admission - 07/27/19 1121      Core Components/Risk  Factors/Patient Goals on Admission    Weight Management Yes;Weight Loss    Intervention Weight Management: Develop a combined nutrition and exercise program designed to reach desired caloric intake, while maintaining appropriate intake of nutrient and fiber, sodium and fats, and appropriate energy expenditure required for the weight goal.;Weight Management: Provide education and appropriate resources to help participant work on and attain dietary goals.    Admit Weight 202 lb 4.8 oz (91.8 kg)    Goal Weight: Short Term 198 lb (89.8 kg)    Goal Weight: Long Term 190 lb (86.2 kg)    Expected Outcomes Short Term: Continue to assess and modify interventions until short term weight is achieved;Long Term: Adherence to nutrition and physical activity/exercise program aimed toward attainment of established weight goal;Weight Loss: Understanding of general recommendations for a balanced deficit meal plan, which promotes 1-2 lb weight loss per week and includes a negative energy balance of [phone] kcal/d;Understanding recommendations for meals to include 15-35% energy as protein, 25-35% energy from fat, 35-60% energy from carbohydrates, less than '200mg'$  of dietary cholesterol, 20-35 gm of total fiber daily;Understanding of distribution of calorie intake throughout the day with the consumption of 4-5 meals/snacks    Diabetes Yes    Intervention Provide education about signs/symptoms and action to take for hypo/hyperglycemia.;Provide education about proper nutrition, including hydration, and aerobic/resistive exercise prescription along with prescribed medications to achieve blood glucose in normal ranges: Fasting glucose 65-99 mg/dL    Expected Outcomes Short Term: Participant verbalizes understanding of the signs/symptoms and immediate care of hyper/hypoglycemia, proper foot care and importance of medication, aerobic/resistive exercise and nutrition plan for blood glucose control.;Long Term: Attainment of HbA1C < 7%.     Hypertension Yes    Intervention Provide education on lifestyle modifcations including regular physical activity/exercise, weight management, moderate sodium restriction and increased consumption of fresh fruit, vegetables, and low fat dairy, alcohol moderation, and smoking cessation.;Monitor prescription use compliance.    Expected Outcomes Short Term: Continued assessment and intervention until BP is < 140/37m HG in hypertensive participants. < 130/851mHG in hypertensive participants with diabetes, heart failure or chronic kidney disease.;Long Term: Maintenance of blood pressure at goal levels.    Lipids Yes    Intervention Provide education and support for participant on nutrition & aerobic/resistive exercise along with prescribed medications to achieve LDL '70mg'$ , HDL >'40mg'$ .    Expected Outcomes Short Term: Participant states understanding of desired cholesterol values and is compliant with medications prescribed. Participant is following exercise prescription and nutrition guidelines.;Long Term: Cholesterol controlled with  medications as prescribed, with individualized exercise RX and with personalized nutrition plan. Value goals: LDL < '70mg'$ , HDL > 40 mg.           Education:Diabetes - Individual verbal and written instruction to review signs/symptoms of diabetes, desired ranges of glucose level fasting, after meals and with exercise. Acknowledge that pre and post exercise glucose checks will be done for 3 sessions at entry of program.   Cardiac Rehab from 07/27/2019 in [facility]  Date 07/23/19  Educator [facility]  Instruction Review Code 1- [employer] Understanding      Education: Know Your Numbers and Risk Factors: -Group verbal and written instruction about important numbers in your health.  Discussion of what are risk factors and how they play a role in the disease process.  Review of Cholesterol, Blood Pressure, Diabetes, and BMI and the role they play in your  overall health.   Core Components/Risk Factors/Patient Goals Review:    Core Components/Risk Factors/Patient Goals at Discharge (Final Review):    ITP Comments:  ITP Comments    Row Name 07/23/19 0951 07/27/19 1115         ITP Comments Initial telephone orientation completed. Diagnosis can be found in [facility] 6/24. EP orientation scheduled for 7/13 at 9am. Completed 6MWT and gym orientation. Initial ITP created and sent for review to Dr. Emily Filbert, Medical Director.             Comments: Initial ITP

## 2019-07-27 NOTE — Patient Instructions (Signed)
Patient Instructions  Patient Details  Name: [name] [name] MRN: [id number] Date of Birth: Dec 13, 1940 Referring Provider:  [name], MD  Below are your personal goals for exercise, nutrition, and risk factors. Our goal is to help you stay on track towards obtaining and maintaining these goals. We will be discussing your progress on these goals with you throughout the program.  Initial Exercise Prescription:  Initial Exercise Prescription - 07/27/19 1100      Date of Initial Exercise RX and Referring Provider   Date 07/27/19    Referring Provider Ida Rogue MD      Treadmill   MPH 2.1    Grade 0.5    Minutes 15    METs 2.75      NuStep   Level 2    SPM 80    Minutes 15    METs 2      T5 Nustep   Level 2    SPM 80    Minutes 15    METs 2      Biostep-RELP   Level 2    SPM 50    Minutes 15    METs 2      Prescription Details   Frequency (times per week) 2    Duration Progress to 30 minutes of continuous aerobic without signs/symptoms of physical distress      Intensity   THRR 40-80% of Max Heartrate 105-129    Ratings of Perceived Exertion 11-13    Perceived Dyspnea 0-4      Progression   Progression Continue to progress workloads to maintain intensity without signs/symptoms of physical distress.      Resistance Training   Training Prescription Yes    Weight 3 lb    Reps 10-15           Exercise Goals: Frequency: Be able to perform aerobic exercise two to three times per week in program working toward 2-5 days per week of home exercise.  Intensity: Work with a perceived exertion of 11 (fairly light) - 15 (hard) while following your exercise prescription.  We will make changes to your prescription with you as you progress through the program.   Duration: Be able to do 30 to 45 minutes of continuous aerobic exercise in addition to a 5 minute warm-up and a 5 minute cool-down routine.   Nutrition Goals: Your personal nutrition goals will be  established when you do your nutrition analysis with the dietician.  The following are general nutrition guidelines to follow: Cholesterol < 200mg /day Sodium < 1500mg /day Fiber: Men over 50 yrs - 30 grams per day  Personal Goals:  Personal Goals and Risk Factors at Admission - 07/27/19 1121      Core Components/Risk Factors/Patient Goals on Admission    Weight Management Yes;Weight Loss    Intervention Weight Management: Develop a combined nutrition and exercise program designed to reach desired caloric intake, while maintaining appropriate intake of nutrient and fiber, sodium and fats, and appropriate energy expenditure required for the weight goal.;Weight Management: Provide education and appropriate resources to help participant work on and attain dietary goals.    Admit Weight 202 lb 4.8 oz (91.8 kg)    Goal Weight: Short Term 198 lb (89.8 kg)    Goal Weight: Long Term 190 lb (86.2 kg)    Expected Outcomes Short Term: Continue to assess and modify interventions until short term weight is achieved;Long Term: Adherence to nutrition and physical activity/exercise program aimed toward attainment of established weight goal;Weight  Loss: Understanding of general recommendations for a balanced deficit meal plan, which promotes 1-2 lb weight loss per week and includes a negative energy balance of [phone] kcal/d;Understanding recommendations for meals to include 15-35% energy as protein, 25-35% energy from fat, 35-60% energy from carbohydrates, less than 200mg  of dietary cholesterol, 20-35 gm of total fiber daily;Understanding of distribution of calorie intake throughout the day with the consumption of 4-5 meals/snacks    Diabetes Yes    Intervention Provide education about signs/symptoms and action to take for hypo/hyperglycemia.;Provide education about proper nutrition, including hydration, and aerobic/resistive exercise prescription along with prescribed medications to achieve blood glucose in normal  ranges: Fasting glucose 65-99 mg/dL    Expected Outcomes Short Term: Participant verbalizes understanding of the signs/symptoms and immediate care of hyper/hypoglycemia, proper foot care and importance of medication, aerobic/resistive exercise and nutrition plan for blood glucose control.;Long Term: Attainment of HbA1C < 7%.    Hypertension Yes    Intervention Provide education on lifestyle modifcations including regular physical activity/exercise, weight management, moderate sodium restriction and increased consumption of fresh fruit, vegetables, and low fat dairy, alcohol moderation, and smoking cessation.;Monitor prescription use compliance.    Expected Outcomes Short Term: Continued assessment and intervention until BP is < 140/35mm HG in hypertensive participants. < 130/5mm HG in hypertensive participants with diabetes, heart failure or chronic kidney disease.;Long Term: Maintenance of blood pressure at goal levels.    Lipids Yes    Intervention Provide education and support for participant on nutrition & aerobic/resistive exercise along with prescribed medications to achieve LDL 70mg , HDL >40mg .    Expected Outcomes Short Term: Participant states understanding of desired cholesterol values and is compliant with medications prescribed. Participant is following exercise prescription and nutrition guidelines.;Long Term: Cholesterol controlled with medications as prescribed, with individualized exercise RX and with personalized nutrition plan. Value goals: LDL < 70mg , HDL > 40 mg.           Tobacco Use Initial Evaluation: Social History   Tobacco Use  Smoking Status Current Some Day Smoker  . Types: Pipe  Smokeless Tobacco Former Systems developer  . Quit date: 10/17/2015    Exercise Goals and Review:  Exercise Goals    Row Name 07/27/19 1119             Exercise Goals   Increase Physical Activity Yes       Intervention Develop an individualized exercise prescription for aerobic and resistive  training based on initial evaluation findings, risk stratification, comorbidities and participant's personal goals.;Provide advice, education, support and counseling about physical activity/exercise needs.       Expected Outcomes Short Term: Attend rehab on a regular basis to increase amount of physical activity.;Long Term: Add in home exercise to make exercise part of routine and to increase amount of physical activity.;Long Term: Exercising regularly at least 3-5 days a week.       Increase Strength and Stamina Yes       Intervention Provide advice, education, support and counseling about physical activity/exercise needs.;Develop an individualized exercise prescription for aerobic and resistive training based on initial evaluation findings, risk stratification, comorbidities and participant's personal goals.       Expected Outcomes Short Term: Increase workloads from initial exercise prescription for resistance, speed, and METs.;Short Term: Perform resistance training exercises routinely during rehab and add in resistance training at home;Long Term: Improve cardiorespiratory fitness, muscular endurance and strength as measured by increased METs and functional capacity (6MWT)       Able to understand  and use rate of perceived exertion (RPE) scale Yes       Intervention Provide education and explanation on how to use RPE scale       Expected Outcomes Short Term: Able to use RPE daily in rehab to express subjective intensity level;Long Term:  Able to use RPE to guide intensity level when exercising independently       Able to understand and use Dyspnea scale Yes       Intervention Provide education and explanation on how to use Dyspnea scale       Expected Outcomes Short Term: Able to use Dyspnea scale daily in rehab to express subjective sense of shortness of breath during exertion;Long Term: Able to use Dyspnea scale to guide intensity level when exercising independently       Knowledge and understanding  of Target Heart Rate Range (THRR) Yes       Intervention Provide education and explanation of THRR including how the numbers were predicted and where they are located for reference       Expected Outcomes Short Term: Able to state/look up THRR;Short Term: Able to use daily as guideline for intensity in rehab;Long Term: Able to use THRR to govern intensity when exercising independently       Able to check pulse independently Yes       Intervention Provide education and demonstration on how to check pulse in carotid and radial arteries.;Review the importance of being able to check your own pulse for safety during independent exercise       Expected Outcomes Short Term: Able to explain why pulse checking is important during independent exercise;Long Term: Able to check pulse independently and accurately       Understanding of Exercise Prescription Yes       Intervention Provide education, explanation, and written materials on patient's individual exercise prescription       Expected Outcomes Short Term: Able to explain program exercise prescription;Long Term: Able to explain home exercise prescription to exercise independently              Copy of goals given to participant.

## 2019-07-28 ENCOUNTER — Encounter: Payer: Self-pay | Admitting: *Deleted

## 2019-07-28 ENCOUNTER — Telehealth: Payer: Self-pay | Admitting: Physician Assistant

## 2019-07-28 ENCOUNTER — Telehealth: Payer: Self-pay | Admitting: *Deleted

## 2019-07-28 DIAGNOSIS — I214 Non-ST elevation (NSTEMI) myocardial infarction: Secondary | ICD-10-CM

## 2019-07-28 DIAGNOSIS — Z955 Presence of coronary angioplasty implant and graft: Secondary | ICD-10-CM

## 2019-07-28 NOTE — Progress Notes (Signed)
Cardiac Individual Treatment Plan  Patient Details  Name: [name] [name] MRN: [id number] Date of Birth: 05/13/1940 Referring Provider:     Cardiac Rehab from 07/27/2019 in [facility] Cardiac and Pulmonary Rehab  Referring Provider Julien Nordmann MD      Initial Encounter Date:    Cardiac Rehab from 07/27/2019 in [facility] Cardiac and Pulmonary Rehab  Date 07/27/19      Visit Diagnosis: NSTEMI (non-ST elevated myocardial infarction) [facility])  Status post coronary artery stent placement  Patient's Home Medications on Admission:  Current Outpatient Medications:  .  acetaminophen (TYLENOL) 650 MG CR tablet, Take 650 mg by mouth 2 (two) times daily as needed (for pain). , Disp: , Rfl:  .  aspirin EC 81 MG tablet, Take 81 mg by mouth daily. , Disp: , Rfl:  .  carvedilol (COREG) 3.125 MG tablet, Take 3.125 mg by mouth 2 (two) times daily with a meal., Disp: , Rfl:  .  clopidogrel (PLAVIX) 75 MG tablet, Take 1 tablet (75 mg total) by mouth daily with breakfast., Disp: 60 tablet, Rfl: 2 .  empagliflozin (JARDIANCE) 10 MG TABS tablet, Take 1 tablet (10 mg total) by mouth daily before breakfast. (Patient not taking: Reported on 07/23/2019), Disp: 90 tablet, Rfl: 1 .  ezetimibe (ZETIA) 10 MG tablet, TAKE 1 TABLET(10 MG) BY MOUTH DAILY (Patient taking differently: Take 10 mg by mouth at bedtime. ), Disp: 90 tablet, Rfl: 0 .  fluticasone (FLONASE) 50 MCG/ACT nasal spray, Place 2 sprays into both nostrils daily. (Patient taking differently: Place 2 sprays into both nostrils daily as needed (for seasonal allergies). ), Disp: 16 g, Rfl: 6 .  hydrochlorothiazide (HYDRODIURIL) 25 MG tablet, Take 25 mg by mouth daily with breakfast., Disp: , Rfl:  .  hydrocortisone cream 1 %, Apply 1 application topically See admin instructions. Apply to affected area/rash every 3 days, Disp: , Rfl:  .  ipratropium (ATROVENT) 0.06 % nasal spray, Place 2 sprays into both nostrils 3 (three) times daily., Disp: 15 mL, Rfl: 12 .   lisinopril (ZESTRIL) 10 MG tablet, TAKE 1 TABLET BY MOUTH DAILY (Patient taking differently: Take 10 mg by mouth. ), Disp: 90 tablet, Rfl: 1 .  Multiple Vitamin (MULTIVITAMIN) capsule, Take 1 capsule by mouth daily with lunch. , Disp: , Rfl:  .  nitroGLYCERIN (NITROSTAT) 0.4 MG SL tablet, Place 1 tablet (0.4 mg total) under the tongue every 5 (five) minutes x 3 doses as needed for chest pain., Disp: 25 tablet, Rfl: 0 .  Omega-3 Fatty Acids (FISH OIL PO), Take 1 capsule by mouth daily at 12 noon. , Disp: , Rfl:  .  pantoprazole (PROTONIX) 40 MG tablet, TAKE 1 TABLET BY MOUTH TWICE DAILY (Patient taking differently: Take 40 mg by mouth daily before breakfast. ), Disp: 180 tablet, Rfl: 0 .  Polyvinyl Alcohol-Povidone (MURINE TEARS FOR DRY EYES OP), Place 1-2 drops into both eyes as needed (for dry eyes). , Disp: , Rfl:  .  psyllium (METAMUCIL) 58.6 % powder, Take 1 packet by mouth at bedtime as needed (if no roughage is consumed that day- Mix and drink). , Disp: , Rfl:  .  rosuvastatin (CRESTOR) 40 MG tablet, Take 1 tablet (40 mg total) by mouth at bedtime., Disp: 60 tablet, Rfl: 2 .  Semaglutide,0.25 or 0.5MG /DOS, (OZEMPIC, 0.25 OR 0.5 MG/DOSE,) 2 MG/1.5ML SOPN, Inject 1 mg into the skin once a week. (Patient taking differently: Inject 1 mg into the skin every Sunday. ), Disp: 3 pen, Rfl: 3 .  sodium chloride (OCEAN) 0.65 % SOLN nasal spray, Place 1 spray into both nostrils as needed for congestion., Disp: , Rfl:  .  tamsulosin (FLOMAX) 0.4 MG CAPS capsule, TAKE 1 CAPSULE(0.4 MG) BY MOUTH DAILY (Patient taking differently: Take 0.4 mg by mouth daily with lunch. ), Disp: 90 capsule, Rfl: 3 .  traMADol (ULTRAM) 50 MG tablet, TAKE 1 TABLET BY MOUTH EVERY 12 HOURS AS NEEDED, Disp: 60 tablet, Rfl: 1  Past Medical History: Past Medical History:  Diagnosis Date  . Aortic insufficiency    a. noted on TTE 2015  . Arthritis   . CAD (coronary artery disease)    a. remote PCI in 1991 and 2005; b. MV 3/15: old  inferior MI, no ischemia, LVEF 50%, slight inferior wall hypokniesis; c. 06/2019 NSTEMI/PCI: LM nl, LAD 80p/m (Atherectomy & 4.5x18 Resolute Onyx DES), 47md, D1 75 (PTCA), RI patent stent, LCX nl, RCA 100p, RPAV fills via L->R collats from LCX.  .Marland KitchenChicken pox   . Colon polyps    4 pre-cancerous   . Diverticulitis   . DM type 2 (diabetes mellitus, type 2) ([facility]   . Family history of adverse reaction to anesthesia   . GERD (gastroesophageal reflux disease)   . Heart murmur   . History of kidney stones   . HOH (hard of hearing)   . Hyperlipidemia   . Hypertension   . Ischemic cardiomyopathy    a. TTE 2015: EF  50-55%, mild global HK; b. 06/2019 Echo: EF 45-50%, Gr1 DD, basal inf AK. Triv MR.  . Kidney stones   . Melan[place]   Resected from his back  . Mitral regurgitation    a. noted on TTE 2015  . Myocardial infarction ([place]     Tobacco Use: Social History   Tobacco Use  Smoking Status Current Some Day Smoker  . Types: Pipe  Smokeless Tobacco Former [employer] developer . Quit date: 10/17/2015    Labs: Recent Review Flowsheet Data    Labs for ITP Cardiac and Pulmonary Rehab Latest Ref Rng & Units 03/02/2018 06/03/2018 09/28/2018 02/10/2019 07/07/2019   Cholestrol 0 - 200 mg/dL 136 - - 120 -   LDLCALC 0 - 99 mg/dL 70 - - - -   LDLDIRECT mg/dL - - - 55.0 -   HDL >39.00 mg/dL 36.50(L) - - 39.00(L) -   Trlycerides 0 - 149 mg/dL 143.0 - - 215.0(H) -   Hemoglobin A1c 4.8 - 5.6 % 7.4(H) 7.8(H) 8.2(H) 7.9(H) 7.0(H)       Exercise Target Goals: Exercise Program Goal: Individual exercise prescription set using results from initial 6 min walk test and THRR while considering  patient's activity barriers and safety.   Exercise Prescription Goal: Initial exercise prescription builds to 30-45 minutes a day of aerobic activity, 2-3 days per week.  Home exercise guidelines will be given to patient during program as part of exercise prescription that the participant will acknowledge.   Education:  Aerobic Exercise & Resistance Training: - Gives group verbal and written instruction on the various components of exercise. Focuses on aerobic and resistive training programs and the benefits of this training and how to safely progress through these programs..   Education: Exercise & Equipment Safety: - Individual verbal instruction and demonstration of equipment use and safety with use of the equipment.   [facility]  Date 07/27/19  [facility] Instruction Review Code 1- Verbalizes Understanding  Education: Exercise Physiology & General Exercise Guidelines: - Group verbal and written instruction with models to review the exercise physiology of the cardiovascular system and associated critical values. Provides general exercise guidelines with specific guidelines to those with heart or lung disease.    Cardiac Rehab from 07/27/2019 in [facility] Cardiac and Pulmonary Rehab  Date 07/27/19  Instruction Review Code 3- Needs Reinforcement  [need identified]      Education: Flexibility, Balance, Mind/Body Relaxation: Provides group verbal/written instruction on the benefits of flexibility and balance training, including mind/body exercise modes such as yoga, pilates and tai chi.  Demonstration and skill practice provided.   Activity Barriers & Risk Stratification:  Activity Barriers & Cardiac Risk Stratification - 07/27/19 1117      Activity Barriers & Cardiac Risk Stratification   Activity Barriers Arthritis;Joint Problems;Neck/Spine Problems;Deconditioning;Muscular Weakness;Balance Concerns   previously dislocated shoulders, broke thumb 2 months ago   Cardiac Risk Stratification High           6 Minute Walk:  6 Minute Walk    Row Name 07/27/19 1116         6 Minute Walk   Phase Initial     Distance 1110 feet     Walk Time 6 minutes     # of Rest Breaks 0     MPH 2.1     METS 2.11     RPE 9     VO2 Peak 7.37     Symptoms  No     Resting HR 81 bpm     Resting BP 112/64     Resting Oxygen Saturation  97 %     Exercise Oxygen Saturation  during 6 min walk 97 %     Max Ex. HR 97 bpm     Max Ex. BP 124/66     2 Minute Post BP 114/72            Oxygen Initial Assessment:   Oxygen Re-Evaluation:   Oxygen Discharge (Final Oxygen Re-Evaluation):   Initial Exercise Prescription:  Initial Exercise Prescription - 07/27/19 1100      Date of Initial Exercise RX and Referring Provider   Date 07/27/19    Referring Provider Ida Rogue MD      Treadmill   MPH 2.1    Grade 0.5    Minutes 15    METs 2.75      NuStep   Level 2    SPM 80    Minutes 15    METs 2      T5 Nustep   Level 2    SPM 80    Minutes 15    METs 2      Biostep-RELP   Level 2    SPM 50    Minutes 15    METs 2      Prescription Details   Frequency (times per week) 2    Duration Progress to 30 minutes of continuous aerobic without signs/symptoms of physical distress      Intensity   THRR 40-80% of Max Heartrate 105-129    Ratings of Perceived Exertion 11-13    Perceived Dyspnea 0-4      Progression   Progression Continue to progress workloads to maintain intensity without signs/symptoms of physical distress.      Resistance Training   Training Prescription Yes    Weight 3 lb    Reps 10-15           Perform Capillary Blood Glucose checks  as needed.  Exercise Prescription Changes:  Exercise Prescription Changes    Row Name 07/27/19 1100             Response to Exercise   Blood Pressure (Admit) 112/64       Blood Pressure (Exercise) 124/66       Blood Pressure (Exit) 114/72       Heart Rate (Admit) 81 bpm       Heart Rate (Exercise) 97 bpm       Heart Rate (Exit) 78 bpm       Oxygen Saturation (Admit) 97 %       Oxygen Saturation (Exercise) 97 %       Rating of Perceived Exertion (Exercise) 9       Symptoms none       Comments walk test results              Exercise Comments:   Exercise Comments    Row Name 07/28/19 1001           Exercise Comments [name] [name] called to let us know that since he got home yesterday after orientation his low back/below ribs on one side has been extremely painful.  He has tried to take Tylenol, but it was not helping.  From his description, it sounds like muscle spasm versus pull or injury.  He was encouraged to try are muscle relief cream and ball massage to release the spasm and relieve pain.  He was also encouraged to contact his doctor for further advice.  He wants to come to education tomorrow, but unsure if he will be able to exercise with his pain.  He will keep [place] posted.              Exercise Goals and Review:  Exercise Goals    Row Name 07/27/19 1119             Exercise Goals   Increase Physical Activity Yes       Intervention Develop an individualized exercise prescription for aerobic and resistive training based on initial evaluation findings, risk stratification, comorbidities and participant's personal goals.;Provide advice, education, support and counseling about physical activity/exercise needs.       Expected Outcomes Short Term: Attend rehab on a regular basis to increase amount of physical activity.;Long Term: Add in home exercise to make exercise part of routine and to increase amount of physical activity.;Long Term: Exercising regularly at least 3-5 days a week.       Increase Strength and Stamina Yes       Intervention Provide advice, education, support and counseling about physical activity/exercise needs.;Develop an individualized exercise prescription for aerobic and resistive training based on initial evaluation findings, risk stratification, comorbidities and participant's personal goals.       Expected Outcomes Short Term: Increase workloads from initial exercise prescription for resistance, speed, and METs.;Short Term: Perform resistance training exercises routinely during rehab and add in resistance training at  home;Long Term: Improve cardiorespiratory fitness, muscular endurance and strength as measured by increased METs and functional capacity (6MWT)       Able to understand and use rate of perceived exertion (RPE) scale Yes       Intervention Provide education and explanation on how to use RPE scale       Expected Outcomes Short Term: Able to use RPE daily in rehab to express subjective intensity level;Long Term:  Able to use RPE to guide intensity level when exercising independently  Able to understand and use Dyspnea scale Yes       Intervention Provide education and explanation on how to use Dyspnea scale       Expected Outcomes Short Term: Able to use Dyspnea scale daily in rehab to express subjective sense of shortness of breath during exertion;Long Term: Able to use Dyspnea scale to guide intensity level when exercising independently       Knowledge and understanding of Target Heart Rate Range (THRR) Yes       Intervention Provide education and explanation of THRR including how the numbers were predicted and where they are located for reference       Expected Outcomes Short Term: Able to state/look up THRR;Short Term: Able to use daily as guideline for intensity in rehab;Long Term: Able to use THRR to govern intensity when exercising independently       Able to check pulse independently Yes       Intervention Provide education and demonstration on how to check pulse in carotid and radial arteries.;Review the importance of being able to check your own pulse for safety during independent exercise       Expected Outcomes Short Term: Able to explain why pulse checking is important during independent exercise;Long Term: Able to check pulse independently and accurately       Understanding of Exercise Prescription Yes       Intervention Provide education, explanation, and written materials on patient's individual exercise prescription       Expected Outcomes Short Term: Able to explain program  exercise prescription;Long Term: Able to explain home exercise prescription to exercise independently              Exercise Goals Re-Evaluation :   Discharge Exercise Prescription (Final Exercise Prescription Changes):  Exercise Prescription Changes - 07/27/19 1100      Response to Exercise   Blood Pressure (Admit) 112/64    Blood Pressure (Exercise) 124/66    Blood Pressure (Exit) 114/72    Heart Rate (Admit) 81 bpm    Heart Rate (Exercise) 97 bpm    Heart Rate (Exit) 78 bpm    Oxygen Saturation (Admit) 97 %    Oxygen Saturation (Exercise) 97 %    Rating of Perceived Exertion (Exercise) 9    Symptoms none    Comments walk test results           Nutrition:  Target Goals: Understanding of nutrition guidelines, daily intake of sodium '1500mg'$ , cholesterol '200mg'$ , calories 30% from fat and 7% or less from saturated fats, daily to have 5 or more servings of fruits and vegetables.  Education: Controlling Sodium/Reading Food Labels -Group verbal and written material supporting the discussion of sodium use in heart healthy nutrition. Review and explanation with models, verbal and written materials for utilization of the food label.   Education: General Nutrition Guidelines/Fats and Fiber: -Group instruction provided by verbal, written material, models and posters to present the general guidelines for heart healthy nutrition. Gives an explanation and review of dietary fats and fiber.   Cardiac Rehab from 07/27/2019 in [facility] Cardiac and Pulmonary Rehab  Date 07/27/19  Instruction Review Code 3- Needs Reinforcement  [need identified]      Biometrics:  Pre Biometrics - 07/27/19 1120      Pre Biometrics   Height 6' 0.4" (1.839 m)    Weight 202 lb 4.8 oz (91.8 kg)    BMI (Calculated) 27.13    Single Leg Stand 5.85 seconds  Nutrition Therapy Plan and Nutrition Goals:   Nutrition Assessments:  Nutrition Assessments - 07/27/19 1120      MEDFICTS Scores   Pre  Score 40           MEDIFICTS Score Key:          ?70 Need to make dietary changes          40-70 Heart Healthy Diet         ? 40 Therapeutic Level Cholesterol Diet  Nutrition Goals Re-Evaluation:   Nutrition Goals Discharge (Final Nutrition Goals Re-Evaluation):   Psychosocial: Target Goals: Acknowledge presence or absence of significant depression and/or stress, maximize coping skills, provide positive support system. Participant is able to verbalize types and ability to use techniques and skills needed for reducing stress and depression.   Education: Depression - Provides group verbal and written instruction on the correlation between heart/lung disease and depressed mood, treatment options, and the stigmas associated with seeking treatment.   Education: Sleep Hygiene -Provides group verbal and written instruction about how sleep can affect your health.  Define sleep hygiene, discuss sleep cycles and impact of sleep habits. Review good sleep hygiene tips.     Education: Stress and Anxiety: - Provides group verbal and written instruction about the health risks of elevated stress and causes of high stress.  Discuss the correlation between heart/lung disease and anxiety and treatment options. Review healthy ways to manage with stress and anxiety.    Initial Review & Psychosocial Screening:  Initial Psych Review & Screening - 07/23/19 0937      Initial Review   Current issues with None Identified      Family Dynamics   Good Support System? Yes   wife and dog     Barriers   Psychosocial barriers to participate in program There are no identifiable barriers or psychosocial needs.      Screening Interventions   Interventions Encouraged to exercise;To provide support and resources with identified psychosocial needs;Provide feedback about the scores to participant    Expected Outcomes Short Term goal: Utilizing psychosocial counselor, staff and physician to assist with  identification of specific Stressors or current issues interfering with healing process. Setting desired goal for each stressor or current issue identified.;Long Term Goal: Stressors or current issues are controlled or eliminated.;Short Term goal: Identification and review with participant of any Quality of Life or Depression concerns found by scoring the questionnaire.;Long Term goal: The participant improves quality of Life and PHQ9 Scores as seen by post scores and/or verbalization of changes           Quality of Life Scores:   Quality of Life - 07/27/19 1120      Quality of Life   Select Quality of Life      Quality of Life Scores   Health/Function Pre 20.67 %    Socioeconomic Pre 25 %    Psych/Spiritual Pre 20.21 %    Family Pre 18.6 %    GLOBAL Pre 21.05 %          Scores of 19 and below usually indicate a poorer quality of life in these areas.  A difference of  2-3 points is a clinically meaningful difference.  A difference of 2-3 points in the total score of the Quality of Life Index has been associated with significant improvement in overall quality of life, self-image, physical symptoms, and general health in studies assessing change in quality of life.  PHQ-9: Recent Review Flowsheet Data  Depression screen [facility] 2/9 07/27/2019 07/12/2019 05/17/2019 10/06/2018 05/15/2017   Decreased Interest 0 0 0 0 0   Down, Depressed, Hopeless 1 0 0 0 0   PHQ - 2 Score 1 0 0 0 0   Altered sleeping 1 - - - -   Tired, decreased energy 1 - - - -   Change in appetite 0 - - - -   Feeling bad or failure about yourself  0 - - - -   Trouble concentrating 1 - - - -   Moving slowly or fidgety/restless 0 - - - -   Suicidal thoughts 0 - - - -   PHQ-9 Score 4 - - - -   Difficult doing work/chores Somewhat difficult - - - -     Interpretation of Total Score  Total Score Depression Severity:  1-4 = Minimal depression, 5-9 = Mild depression, 10-14 = Moderate depression, 15-19 = Moderately severe  depression, 20-27 = Severe depression   Psychosocial Evaluation and Intervention:  Psychosocial Evaluation - 07/23/19 0952      Psychosocial Evaluation & Interventions   Comments [name] [name] reports doing well after his NSTEMI. He has started walking again, taking it easy, and looking forward to starting Cardiac Rehab. His wife is very supportive and he also enjoys time spent with their dog. He did not report any stress concerns and is sleeping well.    Expected Outcomes Short: attend cardiac rehab for education and exercise. Long: maintain positive self care habits.    Continue Psychosocial Services  Follow up required by staff           Psychosocial Re-Evaluation:   Psychosocial Discharge (Final Psychosocial Re-Evaluation):   Vocational Rehabilitation: Provide vocational rehab assistance to qualifying candidates.   Vocational Rehab Evaluation & Intervention:  Vocational Rehab - 07/23/19 0937      Initial Vocational Rehab Evaluation & Intervention   Assessment shows need for Vocational Rehabilitation No           Education: Education Goals: Education classes will be provided on a variety of topics geared toward better understanding of heart health and risk factor modification. Participant will state understanding/return demonstration of topics presented as noted by education test scores.  Learning Barriers/Preferences:  Learning Barriers/Preferences - 07/23/19 3500      Learning Barriers/Preferences   Learning Barriers None    Learning Preferences None           General Cardiac Education Topics:  AED/CPR: - Group verbal and written instruction with the use of models to demonstrate the basic use of the AED with the basic ABC's of resuscitation.   Anatomy & Physiology of the Heart: - Group verbal and written instruction and models provide basic cardiac anatomy and physiology, with the coronary electrical and arterial systems. Review of Valvular disease and Heart Failure    Cardiac Rehab from 07/27/2019 in [facility] Cardiac and Pulmonary Rehab  Date 07/27/19  Instruction Review Code 3- Needs Reinforcement  [need identified]      Cardiac Procedures: - Group verbal and written instruction to review commonly prescribed medications for heart disease. Reviews the medication, class of the drug, and side effects. Includes the steps to properly store meds and maintain the prescription regimen. (beta blockers and nitrates)   Cardiac Medications I: - Group verbal and written instruction to review commonly prescribed medications for heart disease. Reviews the medication, class of the drug, and side effects. Includes the steps to properly store meds and maintain the prescription regimen.  Cardiac Medications II: -Group verbal and written instruction to review commonly prescribed medications for heart disease. Reviews the medication, class of the drug, and side effects. (all other drug classes)    Go Sex-Intimacy & Heart Disease, Get SMART - Goal Setting: - Group verbal and written instruction through game format to discuss heart disease and the return to sexual intimacy. Provides group verbal and written material to discuss and apply goal setting through the application of the S.M.A.R.T. Method.   Other Matters of the Heart: - Provides group verbal, written materials and models to describe Stable Angina and Peripheral Artery. Includes description of the disease process and treatment options available to the cardiac patient.   Infection Prevention: - Provides verbal and written material to individual with discussion of infection control including proper hand washing and proper equipment cleaning during exercise session.   Cardiac Rehab from 07/27/2019 in [facility] Cardiac and Pulmonary Rehab  Date 07/27/19  Educator [facility]  Instruction Review Code 1- Verbalizes Understanding      Falls Prevention: - Provides verbal and written material to individual with discussion of falls  prevention and safety.   Cardiac Rehab from 07/27/2019 in [facility] Cardiac and Pulmonary Rehab  Date 07/27/19  Educator [facility]  Instruction Review Code 1- Verbalizes Understanding      Other: -Provides group and verbal instruction on various topics (see comments)   Knowledge Questionnaire Score:  Knowledge Questionnaire Score - 07/27/19 1120      Knowledge Questionnaire Score   Pre Score 21/26 Education Focus: Pulse, A&P, heart failure, Exercise, Nutrition           Core Components/Risk Factors/Patient Goals at Admission:  Personal Goals and Risk Factors at Admission - 07/27/19 1121      Core Components/Risk Factors/Patient Goals on Admission    Weight Management Yes;Weight Loss    Intervention Weight Management: Develop a combined nutrition and exercise program designed to reach desired caloric intake, while maintaining appropriate intake of nutrient and fiber, sodium and fats, and appropriate energy expenditure required for the weight goal.;Weight Management: Provide education and appropriate resources to help participant work on and attain dietary goals.    Admit Weight 202 lb 4.8 oz (91.8 kg)    Goal Weight: Short Term 198 lb (89.8 kg)    Goal Weight: Long Term 190 lb (86.2 kg)    Expected Outcomes Short Term: Continue to assess and modify interventions until short term weight is achieved;Long Term: Adherence to nutrition and physical activity/exercise program aimed toward attainment of established weight goal;Weight Loss: Understanding of general recommendations for a balanced deficit meal plan, which promotes 1-2 lb weight loss per week and includes a negative energy balance of [phone] kcal/d;Understanding recommendations for meals to include 15-35% energy as protein, 25-35% energy from fat, 35-60% energy from carbohydrates, less than '200mg'$  of dietary cholesterol, 20-35 gm of total fiber daily;Understanding of distribution of calorie intake throughout the day with the consumption of 4-5  meals/snacks    Diabetes Yes    Intervention Provide education about signs/symptoms and action to take for hypo/hyperglycemia.;Provide education about proper nutrition, including hydration, and aerobic/resistive exercise prescription along with prescribed medications to achieve blood glucose in normal ranges: Fasting glucose 65-99 mg/dL    Expected Outcomes Short Term: Participant verbalizes understanding of the signs/symptoms and immediate care of hyper/hypoglycemia, proper foot care and importance of medication, aerobic/resistive exercise and nutrition plan for blood glucose control.;Long Term: Attainment of HbA1C < 7%.    Hypertension Yes    Intervention Provide education  on lifestyle modifcations including regular physical activity/exercise, weight management, moderate sodium restriction and increased consumption of fresh fruit, vegetables, and low fat dairy, alcohol moderation, and smoking cessation.;Monitor prescription use compliance.    Expected Outcomes Short Term: Continued assessment and intervention until BP is < 140/33m HG in hypertensive participants. < 130/859mHG in hypertensive participants with diabetes, heart failure or chronic kidney disease.;Long Term: Maintenance of blood pressure at goal levels.    Lipids Yes    Intervention Provide education and support for participant on nutrition & aerobic/resistive exercise along with prescribed medications to achieve LDL '70mg'$ , HDL >'40mg'$ .    Expected Outcomes Short Term: Participant states understanding of desired cholesterol values and is compliant with medications prescribed. Participant is following exercise prescription and nutrition guidelines.;Long Term: Cholesterol controlled with medications as prescribed, with individualized exercise RX and with personalized nutrition plan. Value goals: LDL < '70mg'$ , HDL > 40 mg.           Education:Diabetes - Individual verbal and written instruction to review signs/symptoms of diabetes, desired  ranges of glucose level fasting, after meals and with exercise. Acknowledge that pre and post exercise glucose checks will be done for 3 sessions at entry of program.   [facility]  Date 07/23/19  [facility]Instruction Review Code 1- VeUnited States Steel Corporationnderstanding      Education: Know Your Numbers and Risk Factors: -Group verbal and written instruction about important numbers in your health.  Discussion of what are risk factors and how they play a role in the disease process.  Review of Cholesterol, Blood Pressure, Diabetes, and BMI and the role they play in your overall health.   Core Components/Risk Factors/Patient Goals Review:    Core Components/Risk Factors/Patient Goals at Discharge (Final Review):    ITP Comments:  ITP Comments    Row Name 07/23/19 0951 07/27/19 1115 07/28/19 1001 07/28/19 1119     ITP Comments Initial telephone orientation completed. [facility] EP orientation scheduled for 7/13 at 9am. Completed 6MWT and gym orientation. Initial ITP created and sent for review to Dr. MaEmily FilbertMedical Director. [name] [name] called to let usKoreanow that since he got home yesterday after orientation his low back/below ribs on one side has been extremely painful.  He has tried to take Tylenol, but it was not helping.  From his description, it sounds like muscle spasm versus pull or injury.  He was encouraged to try are muscle relief cream and ball massage to release the spasm and relieve pain.  He was also encouraged to contact his doctor for further advice.  He wants to come to education tomorrow, but unsure if he will be able to exercise with his pain.  He will keep usKoreaosted. 30 Day review completed. Medical Director ITP review done, changes made as directed, and signed approval by Medical Director.           Comments:

## 2019-07-28 NOTE — Telephone Encounter (Signed)
Ben called to let us know that since he got home yesterday after orientation his low back/below ribs on one side has been extremely painful.  He has tried to take Tylenol, but it was not helping.  From his description, it sounds like muscle spasm versus pull or injury.  He was encouraged to try are muscle relief cream and ball massage to release the spasm and relieve pain.  He was also encouraged to contact his doctor for further advice.  He wants to come to education tomorrow, but unsure if he will be able to exercise with his pain.  He will keep [place] posted.

## 2019-07-28 NOTE — Telephone Encounter (Signed)
Patient started cardiac rehab Tue, and after the cardiac rehab started noticing severe lumbar pain radiating to the side. Tramadol does not resolve the issue. Ok to use oxycodone 5mg  BID PRN. Advised to check with PCP tomorrow, if unable to see PCP, go to ED. He says the pain is significant and it prevents him to go to sleep.

## 2019-07-29 ENCOUNTER — Ambulatory Visit: Payer: Medicare Other

## 2019-07-29 ENCOUNTER — Telehealth: Payer: Self-pay

## 2019-07-29 DIAGNOSIS — S2242XA Multiple fractures of ribs, left side, initial encounter for closed fracture: Secondary | ICD-10-CM | POA: Diagnosis not present

## 2019-07-29 DIAGNOSIS — M4856XA Collapsed vertebra, not elsewhere classified, lumbar region, initial encounter for fracture: Secondary | ICD-10-CM | POA: Diagnosis not present

## 2019-07-29 NOTE — Telephone Encounter (Signed)
Patient called and stated he would like to defer cardiac rehab due to having a recent "cracked rib." Patient recently saw Dr. Marcello Moores Dimmig at [facility]. He will be reassessed next week and will give [place] a call in a couple weeks to provide an update on his evaluation.

## 2019-08-02 ENCOUNTER — Encounter: Payer: Self-pay | Admitting: *Deleted

## 2019-08-02 DIAGNOSIS — Z955 Presence of coronary angioplasty implant and graft: Secondary | ICD-10-CM

## 2019-08-02 DIAGNOSIS — I214 Non-ST elevation (NSTEMI) myocardial infarction: Secondary | ICD-10-CM

## 2019-08-02 NOTE — Progress Notes (Signed)
Cardiac Individual Treatment Plan  Patient Details  Name: [name] [name] MRN: [id number] Date of Birth: 12-Jul-1940 Referring Provider:     Cardiac Rehab from 07/27/2019 in [facility] Cardiac and Pulmonary Rehab  Referring Provider [name] Rogue MD      Initial Encounter Date:    Cardiac Rehab from 07/27/2019 in [facility] Cardiac and Pulmonary Rehab  Date 07/27/19      Visit Diagnosis: NSTEMI (non-ST elevated myocardial infarction) [facility])  Status post coronary artery stent placement  Patient's Home Medications on Admission:  Current Outpatient Medications:  .  acetaminophen (TYLENOL) 650 MG CR tablet, Take 650 mg by mouth 2 (two) times daily as needed (for pain). , Disp: , Rfl:  .  aspirin EC 81 MG tablet, Take 81 mg by mouth daily. , Disp: , Rfl:  .  carvedilol (COREG) 3.125 MG tablet, Take 3.125 mg by mouth 2 (two) times daily with a meal., Disp: , Rfl:  .  clopidogrel (PLAVIX) 75 MG tablet, Take 1 tablet (75 mg total) by mouth daily with breakfast., Disp: 60 tablet, Rfl: 2 .  empagliflozin (JARDIANCE) 10 MG TABS tablet, Take 1 tablet (10 mg total) by mouth daily before breakfast. (Patient not taking: Reported on 07/23/2019), Disp: 90 tablet, Rfl: 1 .  ezetimibe (ZETIA) 10 MG tablet, TAKE 1 TABLET(10 MG) BY MOUTH DAILY (Patient taking differently: Take 10 mg by mouth at bedtime. ), Disp: 90 tablet, Rfl: 0 .  fluticasone (FLONASE) 50 MCG/ACT nasal spray, Place 2 sprays into both nostrils daily. (Patient taking differently: Place 2 sprays into both nostrils daily as needed (for seasonal allergies). ), Disp: 16 g, Rfl: 6 .  hydrochlorothiazide (HYDRODIURIL) 25 MG tablet, Take 25 mg by mouth daily with breakfast., Disp: , Rfl:  .  hydrocortisone cream 1 %, Apply 1 application topically See admin instructions. Apply to affected area/rash every 3 days, Disp: , Rfl:  .  ipratropium (ATROVENT) 0.06 % nasal spray, Place 2 sprays into both nostrils 3 (three) times daily., Disp: 15 mL, Rfl: 12 .   lisinopril (ZESTRIL) 10 MG tablet, TAKE 1 TABLET BY MOUTH DAILY (Patient taking differently: Take 10 mg by mouth. ), Disp: 90 tablet, Rfl: 1 .  Multiple Vitamin (MULTIVITAMIN) capsule, Take 1 capsule by mouth daily with lunch. , Disp: , Rfl:  .  nitroGLYCERIN (NITROSTAT) 0.4 MG SL tablet, Place 1 tablet (0.4 mg total) under the tongue every 5 (five) minutes x 3 doses as needed for chest pain., Disp: 25 tablet, Rfl: 0 .  Omega-3 Fatty Acids (FISH OIL PO), Take 1 capsule by mouth daily at 12 noon. , Disp: , Rfl:  .  pantoprazole (PROTONIX) 40 MG tablet, TAKE 1 TABLET BY MOUTH TWICE DAILY (Patient taking differently: Take 40 mg by mouth daily before breakfast. ), Disp: 180 tablet, Rfl: 0 .  Polyvinyl Alcohol-Povidone (MURINE TEARS FOR DRY EYES OP), Place 1-2 drops into both eyes as needed (for dry eyes). , Disp: , Rfl:  .  psyllium (METAMUCIL) 58.6 % powder, Take 1 packet by mouth at bedtime as needed (if no roughage is consumed that day- Mix and drink). , Disp: , Rfl:  .  rosuvastatin (CRESTOR) 40 MG tablet, Take 1 tablet (40 mg total) by mouth at bedtime., Disp: 60 tablet, Rfl: 2 .  Semaglutide,0.25 or 0.'5MG'$ /DOS, (OZEMPIC, 0.25 OR 0.5 MG/DOSE,) 2 MG/1.5ML SOPN, Inject 1 mg into the skin once a week. (Patient taking differently: Inject 1 mg into the skin every Sunday. ), Disp: 3 pen, Rfl: 3 .  sodium chloride (OCEAN) 0.65 % SOLN nasal spray, Place 1 spray into both nostrils as needed for congestion., Disp: , Rfl:  .  tamsulosin (FLOMAX) 0.4 MG CAPS capsule, TAKE 1 CAPSULE(0.4 MG) BY MOUTH DAILY (Patient taking differently: Take 0.4 mg by mouth daily with lunch. ), Disp: 90 capsule, Rfl: 3 .  traMADol (ULTRAM) 50 MG tablet, TAKE 1 TABLET BY MOUTH EVERY 12 HOURS AS NEEDED, Disp: 60 tablet, Rfl: 1  Past Medical History: Past Medical History:  Diagnosis Date  . Aortic insufficiency    a. noted on TTE 2015  . Arthritis   . CAD (coronary artery disease)    a. remote PCI in 1991 and 2005; b. MV 3/15: old  inferior MI, no ischemia, LVEF 50%, slight inferior wall hypokniesis; c. 06/2019 NSTEMI/PCI: LM nl, LAD 80p/m (Atherectomy & 4.5x18 Resolute Onyx DES), 67md, D1 75 (PTCA), RI patent stent, LCX nl, RCA 100p, RPAV fills via L->R collats from LCX.  .Marland KitchenChicken pox   . Colon polyps    4 pre-cancerous   . Diverticulitis   . DM type 2 (diabetes mellitus, type 2) [place]   . Family history of adverse reaction to anesthesia   . GERD (gastroesophageal reflux disease)   . Heart murmur   . History of kidney stones   . HOH (hard of hearing)   . Hyperlipidemia   . Hypertension   . Ischemic cardiomyopathy    a. TTE 2015: EF  50-55%, mild global HK; b. 06/2019 Echo: EF 45-50%, Gr1 DD, basal inf AK. Triv MR.  . Kidney stones   . [place]   Resected from his back  . Mitral regurgitation    a. noted on TTE 2015  . Myocardial infarction ([place]     Tobacco Use: Social History   Tobacco Use  Smoking Status Current Some Day Smoker  . Types: Pipe  Smokeless Tobacco Former [employer] developer . Quit date: 10/17/2015    Labs: Recent Review Flowsheet Data    Labs for ITP Cardiac and Pulmonary Rehab Latest Ref Rng & Units 03/02/2018 06/03/2018 09/28/2018 02/10/2019 07/07/2019   Cholestrol 0 - 200 mg/dL 136 - - 120 -   LDLCALC 0 - 99 mg/dL 70 - - - -   LDLDIRECT mg/dL - - - 55.0 -   HDL >39.00 mg/dL 36.50(L) - - 39.00(L) -   Trlycerides 0 - 149 mg/dL 143.0 - - 215.0(H) -   Hemoglobin A1c 4.8 - 5.6 % 7.4(H) 7.8(H) 8.2(H) 7.9(H) 7.0(H)       Exercise Target Goals: Exercise Program Goal: Individual exercise prescription set using results from initial 6 min walk test and THRR while considering  patient's activity barriers and safety.   Exercise Prescription Goal: Initial exercise prescription builds to 30-45 minutes a day of aerobic activity, 2-3 days per week.  Home exercise guidelines will be given to patient during program as part of exercise prescription that the participant will acknowledge.   Education:  Aerobic Exercise & Resistance Training: - Gives group verbal and written instruction on the various components of exercise. Focuses on aerobic and resistive training programs and the benefits of this training and how to safely progress through these programs..   Education: Exercise & Equipment Safety: - Individual verbal instruction and demonstration of equipment use and safety with use of the equipment.   [facility]  Date 07/27/19  Educator [facility] Instruction Review Code 1- Verbalizes Understanding  Education: Exercise Physiology & General Exercise Guidelines: - Group verbal and written instruction with models to review the exercise physiology of the cardiovascular system and associated critical values. Provides general exercise guidelines with specific guidelines to those with heart or lung disease.    Cardiac Rehab from 07/27/2019 in [facility] Cardiac and Pulmonary Rehab  Date 07/27/19  Instruction Review Code 3- Needs Reinforcement  [need identified]      Education: Flexibility, Balance, Mind/Body Relaxation: Provides group verbal/written instruction on the benefits of flexibility and balance training, including mind/body exercise modes such as yoga, pilates and tai chi.  Demonstration and skill practice provided.   Activity Barriers & Risk Stratification:  Activity Barriers & Cardiac Risk Stratification - 07/27/19 1117      Activity Barriers & Cardiac Risk Stratification   Activity Barriers Arthritis;Joint Problems;Neck/Spine Problems;Deconditioning;Muscular Weakness;Balance Concerns   previously dislocated shoulders, broke thumb 2 months ago   Cardiac Risk Stratification High           6 Minute Walk:  6 Minute Walk    Row Name 07/27/19 1116         6 Minute Walk   Phase Initial     Distance 1110 feet     Walk Time 6 minutes     # of Rest Breaks 0     MPH 2.1     METS 2.11     RPE 9     VO2 Peak 7.37     Symptoms  No     Resting HR 81 bpm     Resting BP 112/64     Resting Oxygen Saturation  97 %     Exercise Oxygen Saturation  during 6 min walk 97 %     Max Ex. HR 97 bpm     Max Ex. BP 124/66     2 Minute Post BP 114/72            Oxygen Initial Assessment:   Oxygen Re-Evaluation:   Oxygen Discharge (Final Oxygen Re-Evaluation):   Initial Exercise Prescription:  Initial Exercise Prescription - 07/27/19 1100      Date of Initial Exercise RX and Referring Provider   Date 07/27/19    Referring Provider [name] Rogue MD      Treadmill   MPH 2.1    Grade 0.5    Minutes 15    METs 2.75      NuStep   Level 2    SPM 80    Minutes 15    METs 2      T5 Nustep   Level 2    SPM 80    Minutes 15    METs 2      Biostep-RELP   Level 2    SPM 50    Minutes 15    METs 2      Prescription Details   Frequency (times per week) 2    Duration Progress to 30 minutes of continuous aerobic without signs/symptoms of physical distress      Intensity   THRR 40-80% of Max Heartrate 105-129    Ratings of Perceived Exertion 11-13    Perceived Dyspnea 0-4      Progression   Progression Continue to progress workloads to maintain intensity without signs/symptoms of physical distress.      Resistance Training   Training Prescription Yes    Weight 3 lb    Reps 10-15           Perform Capillary Blood Glucose checks  as needed.  Exercise Prescription Changes:  Exercise Prescription Changes    Row Name 07/27/19 1100             Response to Exercise   Blood Pressure (Admit) 112/64       Blood Pressure (Exercise) 124/66       Blood Pressure (Exit) 114/72       Heart Rate (Admit) 81 bpm       Heart Rate (Exercise) 97 bpm       Heart Rate (Exit) 78 bpm       Oxygen Saturation (Admit) 97 %       Oxygen Saturation (Exercise) 97 %       Rating of Perceived Exertion (Exercise) 9       Symptoms none       Comments walk test results              Exercise Comments:   Exercise Comments    Row Name 07/28/19 1001           Exercise Comments [name] [name] called to let us know that since he got home yesterday after orientation his low back/below ribs on one side has been extremely painful.  He has tried to take Tylenol, but it was not helping.  From his description, it sounds like muscle spasm versus pull or injury.  He was encouraged to try are muscle relief cream and ball massage to release the spasm and relieve pain.  He was also encouraged to contact his doctor for further advice.  He wants to come to education tomorrow, but unsure if he will be able to exercise with his pain.  He will keep [place] posted.              Exercise Goals and Review:  Exercise Goals    Row Name 07/27/19 1119             Exercise Goals   Increase Physical Activity Yes       Intervention Develop an individualized exercise prescription for aerobic and resistive training based on initial evaluation findings, risk stratification, comorbidities and participant's personal goals.;Provide advice, education, support and counseling about physical activity/exercise needs.       Expected Outcomes Short Term: Attend rehab on a regular basis to increase amount of physical activity.;Long Term: Add in home exercise to make exercise part of routine and to increase amount of physical activity.;Long Term: Exercising regularly at least 3-5 days a week.       Increase Strength and Stamina Yes       Intervention Provide advice, education, support and counseling about physical activity/exercise needs.;Develop an individualized exercise prescription for aerobic and resistive training based on initial evaluation findings, risk stratification, comorbidities and participant's personal goals.       Expected Outcomes Short Term: Increase workloads from initial exercise prescription for resistance, speed, and METs.;Short Term: Perform resistance training exercises routinely during rehab and add in resistance training at  home;Long Term: Improve cardiorespiratory fitness, muscular endurance and strength as measured by increased METs and functional capacity (6MWT)       Able to understand and use rate of perceived exertion (RPE) scale Yes       Intervention Provide education and explanation on how to use RPE scale       Expected Outcomes Short Term: Able to use RPE daily in rehab to express subjective intensity level;Long Term:  Able to use RPE to guide intensity level when exercising independently  Able to understand and use Dyspnea scale Yes       Intervention Provide education and explanation on how to use Dyspnea scale       Expected Outcomes Short Term: Able to use Dyspnea scale daily in rehab to express subjective sense of shortness of breath during exertion;Long Term: Able to use Dyspnea scale to guide intensity level when exercising independently       Knowledge and understanding of Target Heart Rate Range (THRR) Yes       Intervention Provide education and explanation of THRR including how the numbers were predicted and where they are located for reference       Expected Outcomes Short Term: Able to state/look up THRR;Short Term: Able to use daily as guideline for intensity in rehab;Long Term: Able to use THRR to govern intensity when exercising independently       Able to check pulse independently Yes       Intervention Provide education and demonstration on how to check pulse in carotid and radial arteries.;Review the importance of being able to check your own pulse for safety during independent exercise       Expected Outcomes Short Term: Able to explain why pulse checking is important during independent exercise;Long Term: Able to check pulse independently and accurately       Understanding of Exercise Prescription Yes       Intervention Provide education, explanation, and written materials on patient's individual exercise prescription       Expected Outcomes Short Term: Able to explain program  exercise prescription;Long Term: Able to explain home exercise prescription to exercise independently              Exercise Goals Re-Evaluation :   Discharge Exercise Prescription (Final Exercise Prescription Changes):  Exercise Prescription Changes - 07/27/19 1100      Response to Exercise   Blood Pressure (Admit) 112/64    Blood Pressure (Exercise) 124/66    Blood Pressure (Exit) 114/72    Heart Rate (Admit) 81 bpm    Heart Rate (Exercise) 97 bpm    Heart Rate (Exit) 78 bpm    Oxygen Saturation (Admit) 97 %    Oxygen Saturation (Exercise) 97 %    Rating of Perceived Exertion (Exercise) 9    Symptoms none    Comments walk test results           Nutrition:  Target Goals: Understanding of nutrition guidelines, daily intake of sodium '1500mg'$ , cholesterol '200mg'$ , calories 30% from fat and 7% or less from saturated fats, daily to have 5 or more servings of fruits and vegetables.  Education: Controlling Sodium/Reading Food Labels -Group verbal and written material supporting the discussion of sodium use in heart healthy nutrition. Review and explanation with models, verbal and written materials for utilization of the food label.   Education: General Nutrition Guidelines/Fats and Fiber: -Group instruction provided by verbal, written material, models and posters to present the general guidelines for heart healthy nutrition. Gives an explanation and review of dietary fats and fiber.   Cardiac Rehab from 07/27/2019 in [facility] Cardiac and Pulmonary Rehab  Date 07/27/19  Instruction Review Code 3- Needs Reinforcement  [need identified]      Biometrics:  Pre Biometrics - 07/27/19 1120      Pre Biometrics   Height 6' 0.4" (1.839 m)    Weight 202 lb 4.8 oz (91.8 kg)    BMI (Calculated) 27.13    Single Leg Stand 5.85 seconds  Nutrition Therapy Plan and Nutrition Goals:   Nutrition Assessments:  Nutrition Assessments - 07/27/19 1120      MEDFICTS Scores   Pre  Score 40           MEDIFICTS Score Key:          ?70 Need to make dietary changes          40-70 Heart Healthy Diet         ? 40 Therapeutic Level Cholesterol Diet  Nutrition Goals Re-Evaluation:   Nutrition Goals Discharge (Final Nutrition Goals Re-Evaluation):   Psychosocial: Target Goals: Acknowledge presence or absence of significant depression and/or stress, maximize coping skills, provide positive support system. Participant is able to verbalize types and ability to use techniques and skills needed for reducing stress and depression.   Education: Depression - Provides group verbal and written instruction on the correlation between heart/lung disease and depressed mood, treatment options, and the stigmas associated with seeking treatment.   Education: Sleep Hygiene -Provides group verbal and written instruction about how sleep can affect your health.  Define sleep hygiene, discuss sleep cycles and impact of sleep habits. Review good sleep hygiene tips.     Education: Stress and Anxiety: - Provides group verbal and written instruction about the health risks of elevated stress and causes of high stress.  Discuss the correlation between heart/lung disease and anxiety and treatment options. Review healthy ways to manage with stress and anxiety.    Initial Review & Psychosocial Screening:  Initial Psych Review & Screening - 07/23/19 0937      Initial Review   Current issues with None Identified      Family Dynamics   Good Support System? Yes   wife and dog     Barriers   Psychosocial barriers to participate in program There are no identifiable barriers or psychosocial needs.      Screening Interventions   Interventions Encouraged to exercise;To provide support and resources with identified psychosocial needs;Provide feedback about the scores to participant    Expected Outcomes Short Term goal: Utilizing psychosocial counselor, staff and physician to assist with  identification of specific Stressors or current issues interfering with healing process. Setting desired goal for each stressor or current issue identified.;Long Term Goal: Stressors or current issues are controlled or eliminated.;Short Term goal: Identification and review with participant of any Quality of Life or Depression concerns found by scoring the questionnaire.;Long Term goal: The participant improves quality of Life and PHQ9 Scores as seen by post scores and/or verbalization of changes           Quality of Life Scores:   Quality of Life - 07/27/19 1120      Quality of Life   Select Quality of Life      Quality of Life Scores   Health/Function Pre 20.67 %    Socioeconomic Pre 25 %    Psych/Spiritual Pre 20.21 %    Family Pre 18.6 %    GLOBAL Pre 21.05 %          Scores of 19 and below usually indicate a poorer quality of life in these areas.  A difference of  2-3 points is a clinically meaningful difference.  A difference of 2-3 points in the total score of the Quality of Life Index has been associated with significant improvement in overall quality of life, self-image, physical symptoms, and general health in studies assessing change in quality of life.  PHQ-9: Recent Review Flowsheet Data  Depression screen [facility] 2/9 07/27/2019 07/12/2019 05/17/2019 10/06/2018 05/15/2017   Decreased Interest 0 0 0 0 0   Down, Depressed, Hopeless 1 0 0 0 0   PHQ - 2 Score 1 0 0 0 0   Altered sleeping 1 - - - -   Tired, decreased energy 1 - - - -   Change in appetite 0 - - - -   Feeling bad or failure about yourself  0 - - - -   Trouble concentrating 1 - - - -   Moving slowly or fidgety/restless 0 - - - -   Suicidal thoughts 0 - - - -   PHQ-9 Score 4 - - - -   Difficult doing work/chores Somewhat difficult - - - -     Interpretation of Total Score  Total Score Depression Severity:  1-4 = Minimal depression, 5-9 = Mild depression, 10-14 = Moderate depression, 15-19 = Moderately severe  depression, 20-27 = Severe depression   Psychosocial Evaluation and Intervention:  Psychosocial Evaluation - 07/23/19 0952      Psychosocial Evaluation & Interventions   Comments Suezanne Jacquet reports doing well after his NSTEMI. He has started walking again, taking it easy, and looking forward to starting Cardiac Rehab. His wife is very supportive and he also enjoys time spent with their dog. He did not report any stress concerns and is sleeping well.    Expected Outcomes Short: attend cardiac rehab for education and exercise. Long: maintain positive self care habits.    Continue Psychosocial Services  Follow up required by staff           Psychosocial Re-Evaluation:   Psychosocial Discharge (Final Psychosocial Re-Evaluation):   Vocational Rehabilitation: Provide vocational rehab assistance to qualifying candidates.   Vocational Rehab Evaluation & Intervention:  Vocational Rehab - 07/23/19 0937      Initial Vocational Rehab Evaluation & Intervention   Assessment shows need for Vocational Rehabilitation No           Education: Education Goals: Education classes will be provided on a variety of topics geared toward better understanding of heart health and risk factor modification. Participant will state understanding/return demonstration of topics presented as noted by education test scores.  Learning Barriers/Preferences:  Learning Barriers/Preferences - 07/23/19 8676      Learning Barriers/Preferences   Learning Barriers None    Learning Preferences None           General Cardiac Education Topics:  AED/CPR: - Group verbal and written instruction with the use of models to demonstrate the basic use of the AED with the basic ABC's of resuscitation.   Anatomy & Physiology of the Heart: - Group verbal and written instruction and models provide basic cardiac anatomy and physiology, with the coronary electrical and arterial systems. Review of Valvular disease and Heart Failure    Cardiac Rehab from 07/27/2019 in [facility] Cardiac and Pulmonary Rehab  Date 07/27/19  Instruction Review Code 3- Needs Reinforcement  [need identified]      Cardiac Procedures: - Group verbal and written instruction to review commonly prescribed medications for heart disease. Reviews the medication, class of the drug, and side effects. Includes the steps to properly store meds and maintain the prescription regimen. (beta blockers and nitrates)   Cardiac Medications I: - Group verbal and written instruction to review commonly prescribed medications for heart disease. Reviews the medication, class of the drug, and side effects. Includes the steps to properly store meds and maintain the prescription regimen.  Cardiac Medications II: -Group verbal and written instruction to review commonly prescribed medications for heart disease. Reviews the medication, class of the drug, and side effects. (all other drug classes)    Go Sex-Intimacy & Heart Disease, Get SMART - Goal Setting: - Group verbal and written instruction through game format to discuss heart disease and the return to sexual intimacy. Provides group verbal and written material to discuss and apply goal setting through the application of the S.M.A.R.T. Method.   Other Matters of the Heart: - Provides group verbal, written materials and models to describe Stable Angina and Peripheral Artery. Includes description of the disease process and treatment options available to the cardiac patient.   Infection Prevention: - Provides verbal and written material to individual with discussion of infection control including proper hand washing and proper equipment cleaning during exercise session.   Cardiac Rehab from 07/27/2019 in [facility] Cardiac and Pulmonary Rehab  Date 07/27/19  Educator [facility]  Instruction Review Code 1- Verbalizes Understanding      Falls Prevention: - Provides verbal and written material to individual with discussion of falls  prevention and safety.   Cardiac Rehab from 07/27/2019 in [facility] Cardiac and Pulmonary Rehab  Date 07/27/19  Educator [facility]  Instruction Review Code 1- Verbalizes Understanding      Other: -Provides group and verbal instruction on various topics (see comments)   Knowledge Questionnaire Score:  Knowledge Questionnaire Score - 07/27/19 1120      Knowledge Questionnaire Score   Pre Score 21/26 Education Focus: Pulse, A&P, heart failure, Exercise, Nutrition           Core Components/Risk Factors/Patient Goals at Admission:  Personal Goals and Risk Factors at Admission - 07/27/19 1121      Core Components/Risk Factors/Patient Goals on Admission    Weight Management Yes;Weight Loss    Intervention Weight Management: Develop a combined nutrition and exercise program designed to reach desired caloric intake, while maintaining appropriate intake of nutrient and fiber, sodium and fats, and appropriate energy expenditure required for the weight goal.;Weight Management: Provide education and appropriate resources to help participant work on and attain dietary goals.    Admit Weight 202 lb 4.8 oz (91.8 kg)    Goal Weight: Short Term 198 lb (89.8 kg)    Goal Weight: Long Term 190 lb (86.2 kg)    Expected Outcomes Short Term: Continue to assess and modify interventions until short term weight is achieved;Long Term: Adherence to nutrition and physical activity/exercise program aimed toward attainment of established weight goal;Weight Loss: Understanding of general recommendations for a balanced deficit meal plan, which promotes 1-2 lb weight loss per week and includes a negative energy balance of [phone] kcal/d;Understanding recommendations for meals to include 15-35% energy as protein, 25-35% energy from fat, 35-60% energy from carbohydrates, less than '200mg'$  of dietary cholesterol, 20-35 gm of total fiber daily;Understanding of distribution of calorie intake throughout the day with the consumption of 4-5  meals/snacks    Diabetes Yes    Intervention Provide education about signs/symptoms and action to take for hypo/hyperglycemia.;Provide education about proper nutrition, including hydration, and aerobic/resistive exercise prescription along with prescribed medications to achieve blood glucose in normal ranges: Fasting glucose 65-99 mg/dL    Expected Outcomes Short Term: Participant verbalizes understanding of the signs/symptoms and immediate care of hyper/hypoglycemia, proper foot care and importance of medication, aerobic/resistive exercise and nutrition plan for blood glucose control.;Long Term: Attainment of HbA1C < 7%.    Hypertension Yes    Intervention Provide education  on lifestyle modifcations including regular physical activity/exercise, weight management, moderate sodium restriction and increased consumption of fresh fruit, vegetables, and low fat dairy, alcohol moderation, and smoking cessation.;Monitor prescription use compliance.    Expected Outcomes Short Term: Continued assessment and intervention until BP is < 140/74m HG in hypertensive participants. < 130/813mHG in hypertensive participants with diabetes, heart failure or chronic kidney disease.;Long Term: Maintenance of blood pressure at goal levels.    Lipids Yes    Intervention Provide education and support for participant on nutrition & aerobic/resistive exercise along with prescribed medications to achieve LDL '70mg'$ , HDL >'40mg'$ .    Expected Outcomes Short Term: Participant states understanding of desired cholesterol values and is compliant with medications prescribed. Participant is following exercise prescription and nutrition guidelines.;Long Term: Cholesterol controlled with medications as prescribed, with individualized exercise RX and with personalized nutrition plan. Value goals: LDL < '70mg'$ , HDL > 40 mg.           Education:Diabetes - Individual verbal and written instruction to review signs/symptoms of diabetes, desired  ranges of glucose level fasting, after meals and with exercise. Acknowledge that pre and post exercise glucose checks will be done for 3 sessions at entry of program.   [facility]  Date 07/23/19  Educator [facility] Review Code 1- VeUnited States Steel Corporationnderstanding      Education: Know Your Numbers and Risk Factors: -Group verbal and written instruction about important numbers in your health.  Discussion of what are risk factors and how they play a role in the disease process.  Review of Cholesterol, Blood Pressure, Diabetes, and BMI and the role they play in your overall health.   Core Components/Risk Factors/Patient Goals Review:    Core Components/Risk Factors/Patient Goals at Discharge (Final Review):    ITP Comments:  ITP Comments    Row Name 07/23/19 0951 07/27/19 1115 07/28/19 1001 07/28/19 1119 07/29/19 1614   ITP Comments Initial telephone orientation completed. [facility] EP orientation scheduled for 7/13 at 9am. Completed 6MWT and gym orientation. Initial ITP created and sent for review to Dr. MaEmily FilbertMedical Director. [name] [name] called to let usKoreanow that since he got home yesterday after orientation his low back/below ribs on one side has been extremely painful.  He has tried to take Tylenol, but it was not helping.  From his description, it sounds like muscle spasm versus pull or injury.  He was encouraged to try are muscle relief cream and ball massage to release the spasm and relieve pain.  He was also encouraged to contact his doctor for further advice.  He wants to come to education tomorrow, but unsure if he will be able to exercise with his pain.  He will keep usKoreaosted. 30 Day review completed. Medical Director ITP review done, changes made as directed, and signed approval by Medical Director. Patient called and stated he would like to defer cardiac rehab due to having a recent "cracked rib." [facility]  [facility] and will give usKorea call in a couple weeks to provide an update on his evaluation.          Comments: Discharge ITP

## 2019-08-03 ENCOUNTER — Ambulatory Visit: Payer: Medicare Other

## 2019-08-03 ENCOUNTER — Ambulatory Visit (INDEPENDENT_AMBULATORY_CARE_PROVIDER_SITE_OTHER): Payer: Medicare Other | Admitting: Pharmacist

## 2019-08-03 DIAGNOSIS — I25118 Atherosclerotic heart disease of native coronary artery with other forms of angina pectoris: Secondary | ICD-10-CM

## 2019-08-03 DIAGNOSIS — E1165 Type 2 diabetes mellitus with hyperglycemia: Secondary | ICD-10-CM | POA: Diagnosis not present

## 2019-08-03 DIAGNOSIS — E118 Type 2 diabetes mellitus with unspecified complications: Secondary | ICD-10-CM | POA: Diagnosis not present

## 2019-08-03 NOTE — Progress Notes (Signed)
Agree with plan. Jawara Latorre, NP  

## 2019-08-03 NOTE — Patient Instructions (Signed)
Visit Information  Goals Addressed              This Visit's Progress     Patient Stated   .  "I want to work on my blood sugars" (pt-stated)        [place] (see longtitudinal plan of care for additional care plan information)  Current Barriers:  . Diabetes: at goal; most recent T5V 7.6%; complicated by CAD, BPH, recent hx NSTEMI o Reports that tongue sores completed cleared after stopping Jardiance. Has gone back to taking glipizide 10 mg QAM. Denies hypoglycemia o Noted that he started cardiac rehab, but then starting having rib/back pain. Saw orthopedics, he notes he was diagnosed with a compression fracture. Notes he has f/u with orthopedics later this week to discuss next steps . Current antihyperglycemic regimen: Ozempic 1 mg weekly, glipizide 10 mg QAM  o APPROVED for Ozempic assistance through [employer] through 01/14/20 o Has been unable to tolerate metformin, IR and ER, d/t diarrhea. Notes he developed diarrhea after a few years of tolerating metformin, but diarrhea did improve after metformin was discontinued. . Cardiovascular risk reduction: o Current hypertensive regimen: HCTZ 25 mg QAM, lisinopril 10 mg QAM o Current hyperlipidemia regimen: rosuvastatin 40 mg daily, ezetimibe 10 mg daily; LDL at goal <70 (was 55) on rosuvastatin 10 mg daily + ezetimibe o Current antiplatelet therapy: ASA 81 mg daily, clopidogrel 75 mg daily (DAPT x 12 months then clopidogrel monotherapy)  Pharmacist Clinical Goal(s):  [name] Over the next 90 days, patient will work with PharmD and primary care provider to address optimized medication management  Interventions: . Comprehensive medication review performed, medication list updated in electronic medical record . Inter-disciplinary care team collaboration (see longitudinal plan of care) . Agree w/ discontinuation of Jardiance. Allergy added to patient's profile. Reviewed risk of hypoglycemia w/ glipizide, encouraged to let me or PCP  know if hypoglycemia develops . Requests refill on Ozempic 1 mg weekly. Will collaborate w/ CMA and PCP to fax refill request in to [facility] . Encouraged to discuss bone density evaluation w/ orthopedic provider d/t compression fracture  Patient Self Care Activities:  . Patient will check blood glucose BID , document, and provide at future appointments . Patient will take medications as prescribed . Patient will report any questions or concerns to provider   Please see past updates related to this goal by clicking on the "Past Updates" button in the selected goal         The patient verbalized understanding of instructions provided today and declined a print copy of patient instruction materials.    Plan:  - Will outreach as previously scheduled  [name], PharmD, [place], [place] Pharmacist [place] [phone]

## 2019-08-03 NOTE — Chronic Care Management (AMB) (Signed)
Chronic Care Management   Follow Up Note   08/03/2019 Name: [name] [name] MRN: [id number] DOB: 02-19-1940  Referred by: [name], [name] Reason for referral : Chronic Care Management (Medication Management)   [name] [name] is a 80 y.o. year old male who is a primary care patient of [name], [name]. The CCM team was consulted for assistance with chronic disease management and care coordination needs.    Received call from patient today with medication management updates.   Review of patient status, including review of consultants reports, relevant laboratory and other test results, and collaboration with appropriate care team members and the patient's provider was performed as part of comprehensive patient evaluation and provision of chronic care management services.    SDOH (Social Determinants of Health) assessments performed: Yes See Care Plan activities for detailed interventions related to SDOH)  SDOH Interventions     Most Recent Value  SDOH Interventions  Financial Strain Interventions Other (Comment)  [continued use of patient assistance program]       Outpatient Encounter Medications as of 08/03/2019  Medication Sig  . glipiZIDE (GLUCOTROL) 10 MG tablet Take 10 mg by mouth daily before breakfast.  . acetaminophen (TYLENOL) 650 MG CR tablet Take 650 mg by mouth 2 (two) times daily as needed (for pain).   [name] aspirin EC 81 MG tablet Take 81 mg by mouth daily.   . carvedilol (COREG) 3.125 MG tablet Take 3.125 mg by mouth 2 (two) times daily with a meal.  . clopidogrel (PLAVIX) 75 MG tablet Take 1 tablet (75 mg total) by mouth daily with breakfast.  . ezetimibe (ZETIA) 10 MG tablet TAKE 1 TABLET(10 MG) BY MOUTH DAILY (Patient taking differently: Take 10 mg by mouth at bedtime. )  . fluticasone (FLONASE) 50 MCG/ACT nasal spray Place 2 sprays into both nostrils daily. (Patient taking differently: Place 2 sprays into both nostrils daily as needed (for seasonal  allergies). )  . hydrochlorothiazide (HYDRODIURIL) 25 MG tablet Take 25 mg by mouth daily with breakfast.  . hydrocortisone cream 1 % Apply 1 application topically See admin instructions. Apply to affected area/rash every 3 days  . ipratropium (ATROVENT) 0.06 % nasal spray Place 2 sprays into both nostrils 3 (three) times daily.  [name] lisinopril (ZESTRIL) 10 MG tablet TAKE 1 TABLET BY MOUTH DAILY (Patient taking differently: Take 10 mg by mouth. )  . Multiple Vitamin (MULTIVITAMIN) capsule Take 1 capsule by mouth daily with lunch.   . nitroGLYCERIN (NITROSTAT) 0.4 MG SL tablet Place 1 tablet (0.4 mg total) under the tongue every 5 (five) minutes x 3 doses as needed for chest pain.  . Omega-3 Fatty Acids (FISH OIL PO) Take 1 capsule by mouth daily at 12 noon.   . pantoprazole (PROTONIX) 40 MG tablet TAKE 1 TABLET BY MOUTH TWICE DAILY (Patient taking differently: Take 40 mg by mouth daily before breakfast. )  . Polyvinyl Alcohol-Povidone (MURINE TEARS FOR DRY EYES OP) Place 1-2 drops into both eyes as needed (for dry eyes).   . psyllium (METAMUCIL) 58.6 % powder Take 1 packet by mouth at bedtime as needed (if no roughage is consumed that day- Mix and drink).   . rosuvastatin (CRESTOR) 40 MG tablet Take 1 tablet (40 mg total) by mouth at bedtime.  . Semaglutide,0.25 or 0.5MG /DOS, (OZEMPIC, 0.25 OR 0.5 MG/DOSE,) 2 MG/1.5ML SOPN Inject 1 mg into the skin once a week. (Patient taking differently: Inject 1 mg into the skin every Sunday. )  . sodium chloride (  OCEAN) 0.65 % SOLN nasal spray Place 1 spray into both nostrils as needed for congestion.  . tamsulosin (FLOMAX) 0.4 MG CAPS capsule TAKE 1 CAPSULE(0.4 MG) BY MOUTH DAILY (Patient taking differently: Take 0.4 mg by mouth daily with lunch. )  . traMADol (ULTRAM) 50 MG tablet TAKE 1 TABLET BY MOUTH EVERY 12 HOURS AS NEEDED  . [DISCONTINUED] empagliflozin (JARDIANCE) 10 MG TABS tablet Take 1 tablet (10 mg total) by mouth daily before breakfast. (Patient not  taking: Reported on 07/23/2019)   No facility-administered encounter medications on file as of 08/03/2019.     Objective:   Goals Addressed              This Visit's Progress     Patient Stated   .  "I want to work on my blood sugars" (pt-stated)        [place] (see longtitudinal plan of care for additional care plan information)  Current Barriers:  . Diabetes: at goal; most recent Y6A 6.3%; complicated by CAD, BPH, recent hx NSTEMI o Reports that tongue sores completed cleared after stopping Jardiance. Has gone back to taking glipizide 10 mg QAM. Denies hypoglycemia o Noted that he started cardiac rehab, but then starting having rib/back pain. Saw orthopedics, he notes he was diagnosed with a compression fracture. Notes he has f/u with orthopedics later this week to discuss next steps . Current antihyperglycemic regimen: Ozempic 1 mg weekly, glipizide 10 mg QAM  o APPROVED for Ozempic assistance through [employer] through 01/14/20 o Has been unable to tolerate metformin, IR and ER, d/t diarrhea. Notes he developed diarrhea after a few years of tolerating metformin, but diarrhea did improve after metformin was discontinued. . Cardiovascular risk reduction: o Current hypertensive regimen: HCTZ 25 mg QAM, lisinopril 10 mg QAM o Current hyperlipidemia regimen: rosuvastatin 40 mg daily, ezetimibe 10 mg daily; LDL at goal <70 (was 55) on rosuvastatin 10 mg daily + ezetimibe o Current antiplatelet therapy: ASA 81 mg daily, clopidogrel 75 mg daily (DAPT x 12 months then clopidogrel monotherapy)  Pharmacist Clinical Goal(s):  [name] Over the next 90 days, patient will work with PharmD and primary care provider to address optimized medication management  Interventions: . Comprehensive medication review performed, medication list updated in electronic medical record . Inter-disciplinary care team collaboration (see longitudinal plan of care) . Agree w/ discontinuation of Jardiance.  Allergy added to patient's profile. Reviewed risk of hypoglycemia w/ glipizide, encouraged to let me or PCP know if hypoglycemia develops . Requests refill on Ozempic 1 mg weekly. Will collaborate w/ CMA and PCP to fax refill request in to Novo nordisk . Encouraged to discuss bone density evaluation w/ orthopedic provider d/t compression fracture  Patient Self Care Activities:  . Patient will check blood glucose BID , document, and provide at future appointments . Patient will take medications as prescribed . Patient will report any questions or concerns to provider   Please see past updates related to this goal by clicking on the "Past Updates" button in the selected goal          Plan:  - Will outreach as previously scheduled  [name], PharmD, [place], [place] Pharmacist [place] [phone]

## 2019-08-05 ENCOUNTER — Ambulatory Visit: Payer: Self-pay | Admitting: Pharmacist

## 2019-08-05 ENCOUNTER — Ambulatory Visit: Payer: Medicare Other

## 2019-08-05 DIAGNOSIS — M546 Pain in thoracic spine: Secondary | ICD-10-CM | POA: Diagnosis not present

## 2019-08-05 DIAGNOSIS — E118 Type 2 diabetes mellitus with unspecified complications: Secondary | ICD-10-CM

## 2019-08-05 NOTE — Chronic Care Management (AMB) (Signed)
Chronic Care Management   Follow Up Note   08/05/2019 Name: [name] [name] MRN: [id number] DOB: 1940/11/15  Referred by: [name], [name] Reason for referral : Chronic Care Management (Medication Management)   [name] [name] is a 79 y.o. year old male who is a primary care patient of [name], [name]. The CCM team was consulted for assistance with chronic disease management and care coordination needs.    Medication access completed today.   Review of patient status, including review of consultants reports, relevant laboratory and other test results, and collaboration with appropriate care team members and the patient's provider was performed as part of comprehensive patient evaluation and provision of chronic care management services.    SDOH (Social Determinants of Health) assessments performed: Yes See Care Plan activities for detailed interventions related to [facility])     Outpatient Encounter Medications as of 08/05/2019  Medication Sig  . acetaminophen (TYLENOL) 650 MG CR tablet Take 650 mg by mouth 2 (two) times daily as needed (for pain).   [name] aspirin EC 81 MG tablet Take 81 mg by mouth daily.   . carvedilol (COREG) 3.125 MG tablet Take 3.125 mg by mouth 2 (two) times daily with a meal.  . clopidogrel (PLAVIX) 75 MG tablet Take 1 tablet (75 mg total) by mouth daily with breakfast.  . ezetimibe (ZETIA) 10 MG tablet TAKE 1 TABLET(10 MG) BY MOUTH DAILY (Patient taking differently: Take 10 mg by mouth at bedtime. )  . fluticasone (FLONASE) 50 MCG/ACT nasal spray Place 2 sprays into both nostrils daily. (Patient taking differently: Place 2 sprays into both nostrils daily as needed (for seasonal allergies). )  . glipiZIDE (GLUCOTROL) 10 MG tablet Take 10 mg by mouth daily before breakfast.  . hydrochlorothiazide (HYDRODIURIL) 25 MG tablet Take 25 mg by mouth daily with breakfast.  . hydrocortisone cream 1 % Apply 1 application topically See admin instructions. Apply to  affected area/rash every 3 days  . ipratropium (ATROVENT) 0.06 % nasal spray Place 2 sprays into both nostrils 3 (three) times daily.  [name] lisinopril (ZESTRIL) 10 MG tablet TAKE 1 TABLET BY MOUTH DAILY (Patient taking differently: Take 10 mg by mouth. )  . Multiple Vitamin (MULTIVITAMIN) capsule Take 1 capsule by mouth daily with lunch.   . nitroGLYCERIN (NITROSTAT) 0.4 MG SL tablet Place 1 tablet (0.4 mg total) under the tongue every 5 (five) minutes x 3 doses as needed for chest pain.  . Omega-3 Fatty Acids (FISH OIL PO) Take 1 capsule by mouth daily at 12 noon.   . pantoprazole (PROTONIX) 40 MG tablet TAKE 1 TABLET BY MOUTH TWICE DAILY (Patient taking differently: Take 40 mg by mouth daily before breakfast. )  . Polyvinyl Alcohol-Povidone (MURINE TEARS FOR DRY EYES OP) Place 1-2 drops into both eyes as needed (for dry eyes).   . psyllium (METAMUCIL) 58.6 % powder Take 1 packet by mouth at bedtime as needed (if no roughage is consumed that day- Mix and drink).   . rosuvastatin (CRESTOR) 40 MG tablet Take 1 tablet (40 mg total) by mouth at bedtime.  . Semaglutide,0.25 or 0.5MG /DOS, (OZEMPIC, 0.25 OR 0.5 MG/DOSE,) 2 MG/1.5ML SOPN Inject 1 mg into the skin once a week. (Patient taking differently: Inject 1 mg into the skin every Sunday. )  . sodium chloride (OCEAN) 0.65 % SOLN nasal spray Place 1 spray into both nostrils as needed for congestion.  . tamsulosin (FLOMAX) 0.4 MG CAPS capsule TAKE 1 CAPSULE(0.4 MG) BY MOUTH DAILY (Patient taking  differently: Take 0.4 mg by mouth daily with lunch. )  . traMADol (ULTRAM) 50 MG tablet TAKE 1 TABLET BY MOUTH EVERY 12 HOURS AS NEEDED   No facility-administered encounter medications on file as of 08/05/2019.     Objective:   Goals Addressed              This Visit's Progress     Patient Stated   .  "I want to work on my blood sugars" (pt-stated)        [place] (see longtitudinal plan of care for additional care plan information)  Current  Barriers:  . Diabetes: at goal; most recent Z6W 1.0%; complicated by CAD, BPH, recent hx NSTEMI o Following up on status of Ozempic 1 mg shipment from [employer] . Current antihyperglycemic regimen: Ozempic 1 mg weekly, glipizide 10 mg QAM  o APPROVED for Ozempic assistance through [employer] through 01/14/20 o Has been unable to tolerate metformin, IR and ER, d/t diarrhea. Notes he developed diarrhea after a few years of tolerating metformin, but diarrhea did improve after metformin was discontinued. . Cardiovascular risk reduction: o Current hypertensive regimen: HCTZ 25 mg QAM, lisinopril 10 mg QAM o Current hyperlipidemia regimen: rosuvastatin 40 mg daily, ezetimibe 10 mg daily; LDL at goal <70 (was 55) on rosuvastatin 10 mg daily + ezetimibe o Current antiplatelet therapy: ASA 81 mg daily, clopidogrel 75 mg daily (DAPT x 12 months then clopidogrel monotherapy)  Pharmacist Clinical Goal(s):  [name] Over the next 90 days, patient will work with PharmD and primary care provider to address optimized medication management  Interventions: . Comprehensive medication review performed, medication list updated in electronic medical record . Inter-disciplinary care team collaboration (see longitudinal plan of care) . [employer]. They note Ozempic 1 mg order was processed 07/20/19. Medication should arrive in 10-14 business days, so should arrive by Monday, 7/26. If it does not, will call [facility] Nord[facility] to obtain voucher information for patient to receive 1 month of a supply at his local pharmacy for free while awaiting shipment from [place]  Patient Self Care Activities:  . Patient will check blood glucose BID , document, and provide at future appointments . Patient will take medications as prescribed . Patient will report any questions or concerns to provider   Please see past updates related to this goal by clicking on the "Past Updates" button in the selected goal          Plan:  -  Will f/u on Monday regarding status of shipment of Ozempic from [place], PharmD, [place], [place] Pharmacist [employer] [phone]

## 2019-08-05 NOTE — Patient Instructions (Signed)
Visit Information  Goals Addressed              This Visit's Progress     Patient Stated   .  "I want to work on my blood sugars" (pt-stated)        [place] (see longtitudinal plan of care for additional care plan information)  Current Barriers:  . Diabetes: at goal; most recent E1E 0.7%; complicated by CAD, BPH, recent hx NSTEMI o Following up on status of Ozempic 1 mg shipment from [employer] . Current antihyperglycemic regimen: Ozempic 1 mg weekly, glipizide 10 mg QAM  o APPROVED for Ozempic assistance through [employer] through 01/14/20 o Has been unable to tolerate metformin, IR and ER, d/t diarrhea. Notes he developed diarrhea after a few years of tolerating metformin, but diarrhea did improve after metformin was discontinued. . Cardiovascular risk reduction: o Current hypertensive regimen: HCTZ 25 mg QAM, lisinopril 10 mg QAM o Current hyperlipidemia regimen: rosuvastatin 40 mg daily, ezetimibe 10 mg daily; LDL at goal <70 (was 55) on rosuvastatin 10 mg daily + ezetimibe o Current antiplatelet therapy: ASA 81 mg daily, clopidogrel 75 mg daily (DAPT x 12 months then clopidogrel monotherapy)  Pharmacist Clinical Goal(s):  [name] Over the next 90 days, patient will work with PharmD and primary care provider to address optimized medication management  Interventions: . Comprehensive medication review performed, medication list updated in electronic medical record . Inter-disciplinary care team collaboration (see longitudinal plan of care) . [employer]. They note Ozempic 1 mg order was processed 07/20/19. Medication should arrive in 10-14 business days, so should arrive by Monday, 7/26. If it does not, will call Novo Nordisk to obtain voucher information for patient to receive 1 month of a supply at his local pharmacy for free while awaiting shipment from [place]  Patient Self Care Activities:  . Patient will check blood glucose BID , document, and provide at future  appointments . Patient will take medications as prescribed . Patient will report any questions or concerns to provider   Please see past updates related to this goal by clicking on the "Past Updates" button in the selected goal         The patient verbalized understanding of instructions provided today and declined a print copy of patient instruction materials.   Plan:  - Will f/u on Monday regarding status of shipment of Ozempic from [place], PharmD, [place], [place] Pharmacist [employer] [phone]

## 2019-08-05 NOTE — Progress Notes (Signed)
Agree with plan. Dewanna Hurston, NP  

## 2019-08-06 ENCOUNTER — Other Ambulatory Visit: Payer: Self-pay | Admitting: Cardiovascular Disease

## 2019-08-06 ENCOUNTER — Other Ambulatory Visit: Payer: Self-pay | Admitting: Gastroenterology

## 2019-08-06 ENCOUNTER — Other Ambulatory Visit: Payer: Self-pay

## 2019-08-06 MED ORDER — PANTOPRAZOLE SODIUM 40 MG PO TBEC: 40.0000 mg | DELAYED_RELEASE_TABLET | Freq: Every day | ORAL | 3 refills | Status: DC

## 2019-08-09 ENCOUNTER — Ambulatory Visit: Payer: Medicare Other

## 2019-08-09 DIAGNOSIS — E118 Type 2 diabetes mellitus with unspecified complications: Secondary | ICD-10-CM

## 2019-08-09 DIAGNOSIS — E1165 Type 2 diabetes mellitus with hyperglycemia: Secondary | ICD-10-CM | POA: Diagnosis not present

## 2019-08-09 DIAGNOSIS — I25118 Atherosclerotic heart disease of native coronary artery with other forms of angina pectoris: Secondary | ICD-10-CM | POA: Diagnosis not present

## 2019-08-09 MED ORDER — OZEMPIC (1 MG/DOSE) 2 MG/1.5ML ~~LOC~~ SOPN: 1.0000 mg | PEN_INJECTOR | SUBCUTANEOUS | 0 refills | Status: DC

## 2019-08-10 ENCOUNTER — Ambulatory Visit: Payer: Medicare Other

## 2019-08-10 NOTE — Patient Instructions (Signed)
Visit Information  Goals Addressed              This Visit's Progress     Patient Stated   .  "I want to work on my blood sugars" (pt-stated)        [place] (see longtitudinal plan of care for additional care plan information)  Current Barriers:  . Diabetes: at goal; most recent H0T 8.8%; complicated by CAD, BPH, recent hx NSTEMI o Following up on status of Ozempic 1 mg shipment from [employer]. Order was processed 14 days ago, we have not received to the office yet.  . Current antihyperglycemic regimen: Ozempic 1 mg weekly, glipizide 10 mg QAM  o APPROVED for Ozempic assistance through [employer] through 01/14/20 o Has been unable to tolerate metformin, IR and ER, d/t diarrhea. Notes he developed diarrhea after a few years of tolerating metformin, but diarrhea did improve after metformin was discontinued. . Cardiovascular risk reduction: o Current hypertensive regimen: HCTZ 25 mg QAM, lisinopril 10 mg QAM o Current hyperlipidemia regimen: rosuvastatin 40 mg daily, ezetimibe 10 mg daily; LDL at goal <70 (was 55) on rosuvastatin 10 mg daily + ezetimibe o Current antiplatelet therapy: ASA 81 mg daily, clopidogrel 75 mg daily (DAPT x 12 months then clopidogrel monotherapy)  Pharmacist Clinical Goal(s):  [name] Over the next 90 days, patient will work with PharmD and primary care provider to address optimized medication management  Interventions: . [employer]. Reviewed situation. They provided me with billing information for 1 month free of Ozempic 1 mg (BIN: [id number], PCN: [id number], Group: [id number], ID: [id number]). Provided this information to the pharmacy.  . Received verbal permission from PCP to send order for Ozempic 1 mg pen, 30 day supply under her to be used w/ the above coupon . Contacted patient, reviewed the above. He verbalized understanding and appreciation   Patient Self Care Activities:  . Patient will check blood glucose BID , document, and provide at  future appointments . Patient will take medications as prescribed . Patient will report any questions or concerns to provider   Please see past updates related to this goal by clicking on the "Past Updates" button in the selected goal         The patient verbalized understanding of instructions provided today and declined a print copy of patient instruction materials.   Plan:  - Will outreach patient as previously scheduled  [name], PharmD, [place], [place] Pharmacist [place] [phone]

## 2019-08-10 NOTE — Chronic Care Management (AMB) (Signed)
Chronic Care Management   Follow Up Note   08/10/2019 Name: [name] [name] MRN: [id number] DOB: February 16, 1940  Referred by: [name] Hawthorne, [name] Reason for referral : Chronic Care Management (Medication Management)   [name] [name] is a 79 y.o. year old male who is a primary care patient of [name] Hawthorne, [name]. The CCM team was consulted for assistance with chronic disease management and care coordination needs.    Contacted patient for medication management  Review of patient status, including review of consultants reports, relevant laboratory and other test results, and collaboration with appropriate care team members and the patient's provider was performed as part of comprehensive patient evaluation and provision of chronic care management services.    SDOH (Social Determinants of Health) assessments performed: No See Care Plan activities for detailed interventions related to [facility])     Outpatient Encounter Medications as of 08/09/2019  Medication Sig  . acetaminophen (TYLENOL) 650 MG CR tablet Take 650 mg by mouth 2 (two) times daily as needed (for pain).   [name] aspirin EC 81 MG tablet Take 81 mg by mouth daily.   . carvedilol (COREG) 3.125 MG tablet Take 3.125 mg by mouth 2 (two) times daily with a meal.  . clopidogrel (PLAVIX) 75 MG tablet Take 1 tablet (75 mg total) by mouth daily with breakfast.  . ezetimibe (ZETIA) 10 MG tablet TAKE 1 TABLET(10 MG) BY MOUTH DAILY  . fluticasone (FLONASE) 50 MCG/ACT nasal spray Place 2 sprays into both nostrils daily. (Patient taking differently: Place 2 sprays into both nostrils daily as needed (for seasonal allergies). )  . glipiZIDE (GLUCOTROL) 10 MG tablet Take 10 mg by mouth daily before breakfast.  . hydrochlorothiazide (HYDRODIURIL) 25 MG tablet Take 25 mg by mouth daily with breakfast.  . hydrocortisone cream 1 % Apply 1 application topically See admin instructions. Apply to affected area/rash every 3 days  . ipratropium  (ATROVENT) 0.06 % nasal spray Place 2 sprays into both nostrils 3 (three) times daily.  [name] lisinopril (ZESTRIL) 10 MG tablet TAKE 1 TABLET BY MOUTH DAILY (Patient taking differently: Take 10 mg by mouth. )  . Multiple Vitamin (MULTIVITAMIN) capsule Take 1 capsule by mouth daily with lunch.   . nitroGLYCERIN (NITROSTAT) 0.4 MG SL tablet Place 1 tablet (0.4 mg total) under the tongue every 5 (five) minutes x 3 doses as needed for chest pain.  . Omega-3 Fatty Acids (FISH OIL PO) Take 1 capsule by mouth daily at 12 noon.   . pantoprazole (PROTONIX) 40 MG tablet Take 1 tablet (40 mg total) by mouth daily before breakfast.  . Polyvinyl Alcohol-Povidone (MURINE TEARS FOR DRY EYES OP) Place 1-2 drops into both eyes as needed (for dry eyes).   . psyllium (METAMUCIL) 58.6 % powder Take 1 packet by mouth at bedtime as needed (if no roughage is consumed that day- Mix and drink).   . rosuvastatin (CRESTOR) 40 MG tablet Take 1 tablet (40 mg total) by mouth at bedtime.  . Semaglutide, 1 MG/DOSE, (OZEMPIC, 1 MG/DOSE,) 2 MG/1.5ML SOPN Inject 0.75 mLs (1 mg total) into the skin once a week.  . sodium chloride (OCEAN) 0.65 % SOLN nasal spray Place 1 spray into both nostrils as needed for congestion.  . tamsulosin (FLOMAX) 0.4 MG CAPS capsule TAKE 1 CAPSULE(0.4 MG) BY MOUTH DAILY (Patient taking differently: Take 0.4 mg by mouth daily with lunch. )  . traMADol (ULTRAM) 50 MG tablet TAKE 1 TABLET BY MOUTH EVERY 12 HOURS AS NEEDED  . [DISCONTINUED]  Semaglutide,0.25 or 0.5MG /DOS, (OZEMPIC, 0.25 OR 0.5 MG/DOSE,) 2 MG/1.5ML SOPN Inject 1 mg into the skin once a week. (Patient taking differently: Inject 1 mg into the skin every Sunday. )   No facility-administered encounter medications on file as of 08/09/2019.     Objective:   Goals Addressed              This Visit's Progress     Patient Stated   .  "I want to work on my blood sugars" (pt-stated)        [name] [name] (see longtitudinal plan of care for  additional care plan information)  Current Barriers:  . Diabetes: at goal; most recent Z3Y 8.6%; complicated by CAD, BPH, recent hx NSTEMI o Following up on status of Ozempic 1 mg shipment from [employer]. Order was processed 14 days ago, we have not received to the office yet.  . Current antihyperglycemic regimen: Ozempic 1 mg weekly, glipizide 10 mg QAM  o APPROVED for Ozempic assistance through [employer] through 01/14/20 o Has been unable to tolerate metformin, IR and ER, d/t diarrhea. Notes he developed diarrhea after a few years of tolerating metformin, but diarrhea did improve after metformin was discontinued. . Cardiovascular risk reduction: o Current hypertensive regimen: HCTZ 25 mg QAM, lisinopril 10 mg QAM o Current hyperlipidemia regimen: rosuvastatin 40 mg daily, ezetimibe 10 mg daily; LDL at goal <70 (was 55) on rosuvastatin 10 mg daily + ezetimibe o Current antiplatelet therapy: ASA 81 mg daily, clopidogrel 75 mg daily (DAPT x 12 months then clopidogrel monotherapy)  Pharmacist Clinical Goal(s):  [name] Over the next 90 days, patient will work with PharmD and primary care provider to address optimized medication management  Interventions: . [employer]. Reviewed situation. They provided me with billing information for 1 month free of Ozempic 1 mg (BIN: [id number], PCN: [id number], Group: [id number], ID: [id number]). Provided this information to the pharmacy.  . Received verbal permission from PCP to send order for Ozempic 1 mg pen, 30 day supply under her to be used w/ the above coupon . Contacted patient, reviewed the above. He verbalized understanding and appreciation   Patient Self Care Activities:  . Patient will check blood glucose BID , document, and provide at future appointments . Patient will take medications as prescribed . Patient will report any questions or concerns to provider   Please see past updates related to this goal by clicking on the "Past Updates"  button in the selected goal          Plan:  - Will outreach patient as previously scheduled  [name], PharmD, [place], [place] Pharmacist [place] [phone]

## 2019-08-11 ENCOUNTER — Ambulatory Visit: Payer: Self-pay | Admitting: Urology

## 2019-08-11 NOTE — Progress Notes (Signed)
Agree with plan. Sukhdeep Wieting, NP  

## 2019-08-12 ENCOUNTER — Ambulatory Visit: Payer: Medicare Other

## 2019-08-12 ENCOUNTER — Ambulatory Visit: Payer: Medicare Other | Admitting: Urology

## 2019-08-13 ENCOUNTER — Telehealth: Payer: Self-pay

## 2019-08-13 ENCOUNTER — Other Ambulatory Visit: Payer: Self-pay | Admitting: Urology

## 2019-08-13 DIAGNOSIS — N281 Cyst of kidney, acquired: Secondary | ICD-10-CM

## 2019-08-13 NOTE — Telephone Encounter (Signed)
Called patient and informed him that His Ozempic is at the office. He will come and pick it up before 5.   Ozempic 1x3ML 4PCS.NF PLUS  LOT NUMBER: [id number] NDC: [id number] EXP: 10/13/2021

## 2019-08-13 NOTE — Telephone Encounter (Signed)
Patient has picked up his medication.

## 2019-08-17 ENCOUNTER — Ambulatory Visit: Payer: Medicare Other

## 2019-08-18 ENCOUNTER — Encounter: Payer: Self-pay | Admitting: Family

## 2019-08-18 ENCOUNTER — Other Ambulatory Visit: Payer: Self-pay

## 2019-08-18 ENCOUNTER — Ambulatory Visit
Admission: RE | Admit: 2019-08-18 | Discharge: 2019-08-18 | Disposition: A | Discharge status: Home / Self Care | Payer: Medicare Other | Source: Ambulatory Visit | Attending: Urology | Admitting: Urology

## 2019-08-18 ENCOUNTER — Ambulatory Visit (INDEPENDENT_AMBULATORY_CARE_PROVIDER_SITE_OTHER): Payer: Medicare Other | Admitting: Family

## 2019-08-18 DIAGNOSIS — M545 Low back pain: Secondary | ICD-10-CM

## 2019-08-18 DIAGNOSIS — I10 Essential (primary) hypertension: Secondary | ICD-10-CM | POA: Diagnosis not present

## 2019-08-18 DIAGNOSIS — E118 Type 2 diabetes mellitus with unspecified complications: Secondary | ICD-10-CM | POA: Diagnosis not present

## 2019-08-18 DIAGNOSIS — N281 Cyst of kidney, acquired: Secondary | ICD-10-CM | POA: Insufficient documentation

## 2019-08-18 DIAGNOSIS — Z9049 Acquired absence of other specified parts of digestive tract: Secondary | ICD-10-CM | POA: Diagnosis not present

## 2019-08-18 DIAGNOSIS — E782 Mixed hyperlipidemia: Secondary | ICD-10-CM

## 2019-08-18 DIAGNOSIS — K760 Fatty (change of) liver, not elsewhere classified: Secondary | ICD-10-CM | POA: Diagnosis not present

## 2019-08-18 DIAGNOSIS — G8929 Other chronic pain: Secondary | ICD-10-CM

## 2019-08-18 DIAGNOSIS — N2889 Other specified disorders of kidney and ureter: Secondary | ICD-10-CM | POA: Diagnosis not present

## 2019-08-18 MED ORDER — GADOBUTROL 1 MMOL/ML IV SOLN
9.0000 mL | Freq: Once | INTRAVENOUS | Status: AC | PRN
  Administered 2019-08-18: 9 mL via INTRAVENOUS

## 2019-08-18 NOTE — Progress Notes (Signed)
Subjective:    Patient ID: [name] [name], male    DOB: 1941-01-02, 79 y.o.   MRN: [id number]  CC: [name] [name] is a 79 y.o. male who presents today for follow up.   HPI:  Feels well today, no new complaints.  He does not want to have a physical today and would like to have a follow-up   HLD- Had been on 40mg  crestor and now on crestor 10mg  due to concern for constipation, since resolved.   DM- improved, compliant with ozempic which was increased, compliant with glipizide. FBG 150, Post prandial 124  HTN-compliant medications. No longer during cardiac rehab as wasn't pleased. Walking every day 30 minutes, briskly. No cp, sob.   Left low back and rib pain is resolved. has seen Dr Joselyn Arrow 3 weeks ago and again 2 weeks ago. Given oxycodone for prn use; has only used 2-3 tablets. Pain controlled with tylenol and tramadol prn.  Pain with bending,reaching. No numbness in legs. Per patient, Had xrays and  told he had compression fracture. ( unable to see notes in care everywhere).     a1c 7.0 1 month ago  HISTORY:  Past Medical History:  Diagnosis Date   Aortic insufficiency    a. noted on TTE 2015   Arthritis    CAD (coronary artery disease)    a. remote PCI in 1991 and 2005; b. MV 3/15: old inferior MI, no ischemia, LVEF 50%, slight inferior wall hypokniesis; c. 06/2019 NSTEMI/PCI: LM nl, LAD 80p/m (Atherectomy & 4.5x18 Resolute Onyx DES), 34m/d, D1 75 (PTCA), RI patent stent, LCX nl, RCA 100p, RPAV fills via L->R collats from LCX.   Chicken pox    Colon polyps    4 pre-cancerous    Diverticulitis    DM type 2 (diabetes mellitus, type 2) (HCC)    Family history of adverse reaction to anesthesia    GERD (gastroesophageal reflux disease)    Heart murmur    History of kidney stones    HOH (hard of hearing)    Hyperlipidemia    Hypertension    Ischemic cardiomyopathy    a. TTE 2015: EF  50-55%, mild global HK; b. 06/2019 Echo: EF 45-50%, Gr1 DD, basal inf AK.  Triv MR.   Kidney stones    Melanoma ([place]) 1980   Resected from his back   Mitral regurgitation    a. noted on TTE 2015   Myocardial infarction [facility])    Past Surgical History:  Procedure Laterality Date   CHOLECYSTECTOMY  2012   COLONOSCOPY     in 2003 with polyp removed and leak anastomosis had to have open abdominal surgery    [place] & 2005   CORONARY ATHERECTOMY N/A 07/08/2019   Procedure: CORONARY ATHERECTOMY;  Surgeon: Jettie Booze, MD;  Location: [place] [facility];  Service: Cardiovascular;  Laterality: N/A;   CORONARY BALLOON ANGIOPLASTY N/A 07/08/2019   Procedure: CORONARY BALLOON ANGIOPLASTY;  Surgeon: Jettie Booze, MD;  Location: [place] [facility];  Service: Cardiovascular;  Laterality: N/A;  diagonal    CORONARY STENT INTERVENTION N/A 07/08/2019   Procedure: CORONARY STENT INTERVENTION;  Surgeon: Jettie Booze, MD;  Location: [place] [facility];  Service: Cardiovascular;  Laterality: N/A;  lad   CYSTOSCOPY/URETEROSCOPY/HOLMIUM LASER/STENT PLACEMENT Right 01/02/2018   Procedure: CYSTOSCOPY/URETEROSCOPY/HOLMIUM LASER/STENT PLACEMENT;  Surgeon: [name], MD;  Location: [facility] ORS;  Service: Urology;  Laterality: Right;   ESOPHAGOGASTRODUODENOSCOPY (EGD) WITH PROPOFOL N/A 11/19/2016  Procedure: ESOPHAGOGASTRODUODENOSCOPY (EGD) WITH PROPOFOL;  Surgeon: [name], MD;  [facility];  Service: Endoscopy;  Laterality: N/A;   ESOPHAGOGASTRODUODENOSCOPY (EGD) WITH PROPOFOL N/A 02/02/2019   Procedure: ESOPHAGOGASTRODUODENOSCOPY (EGD) WITH PROPOFOL;  Surgeon: [name], MD;  Location: [facility] ENDOSCOPY;  Service: Endoscopy;  Laterality: N/A;   INTRAVASCULAR PRESSURE WIRE/FFR STUDY N/A 07/07/2019   Procedure: INTRAVASCULAR PRESSURE WIRE/FFR STUDY;  Surgeon: Nelva Bush, MD;  Location: [place] [facility];  Service: Cardiovascular;  Laterality: N/A;   INTRAVASCULAR ULTRASOUND/IVUS N/A  07/08/2019   Procedure: Intravascular Ultrasound/IVUS;  Surgeon: Jettie Booze, MD;  Location: [place] [facility];  Service: Cardiovascular;  Laterality: N/A;   LEFT HEART CATH N/A 07/08/2019   Procedure: Left Heart Cath;  Surgeon: Jettie Booze, MD;  Location: [place] [facility];  Service: Cardiovascular;  Laterality: N/A;   LEFT HEART CATH AND CORONARY ANGIOGRAPHY N/A 07/07/2019   Procedure: LEFT HEART CATH AND CORONARY ANGIOGRAPHY;  Surgeon: Nelva Bush, MD;  Location: [place] [facility];  Service: Cardiovascular;  Laterality: N/A;   LITHOTRIPSY  2015   MELANOMA EXCISION  1980   malignant   NERVE SURGERY  2015   ulna nerve   TONSILLECTOMY  1945   WISDOM TOOTH EXTRACTION     Family History  Problem Relation Age of Onset   Hyperlipidemia Mother    Hypertension Mother    Heart disease Mother    Diabetes Mother    Heart attack Mother    Colon cancer Father    Lung cancer Father    Kidney cancer Father        malignant capsulated kidney tumor   Diabetes Father    Liver cancer Father    Bladder Cancer Neg Hx    Prostate cancer Neg Hx     Allergies: Metformin and related, Azithromycin, Other, Percocet [oxycodone-acetaminophen], and Jardiance [empagliflozin] Current Outpatient Medications on File Prior to Visit  Medication Sig Dispense Refill   acetaminophen (TYLENOL) 650 MG CR tablet Take 650 mg by mouth 2 (two) times daily as needed (for pain).      aspirin EC 81 MG tablet Take 81 mg by mouth daily.      carvedilol (COREG) 3.125 MG tablet Take 3.125 mg by mouth 2 (two) times daily with a meal.     clopidogrel (PLAVIX) 75 MG tablet Take 1 tablet (75 mg total) by mouth daily with breakfast. 60 tablet 2   ezetimibe (ZETIA) 10 MG tablet TAKE 1 TABLET(10 MG) BY MOUTH DAILY 90 tablet 0   fluticasone (FLONASE) 50 MCG/ACT nasal spray Place 2 sprays into both nostrils daily. (Patient taking differently: Place 2 sprays into both nostrils daily as  needed (for seasonal allergies). ) 16 g 6   glipiZIDE (GLUCOTROL) 10 MG tablet Take 10 mg by mouth daily before breakfast.     hydrochlorothiazide (HYDRODIURIL) 25 MG tablet Take 25 mg by mouth daily with breakfast.     hydrocortisone cream 1 % Apply 1 application topically See admin instructions. Apply to affected area/rash every 3 days     ipratropium (ATROVENT) 0.06 % nasal spray Place 2 sprays into both nostrils 3 (three) times daily. 15 mL 12   lisinopril (ZESTRIL) 10 MG tablet TAKE 1 TABLET BY MOUTH DAILY (Patient taking differently: Take 10 mg by mouth. ) 90 tablet 1   Multiple Vitamin (MULTIVITAMIN) capsule Take 1 capsule by mouth daily with lunch.      nitroGLYCERIN (NITROSTAT) 0.4 MG SL tablet Place 1 tablet (0.4 mg total) under  the tongue every 5 (five) minutes x 3 doses as needed for chest pain. 25 tablet 0   Omega-3 Fatty Acids (FISH OIL PO) Take 1 capsule by mouth daily at 12 noon.      ONETOUCH VERIO test strip 2 (two) times daily.     oxyCODONE (OXY IR/ROXICODONE) 5 MG immediate release tablet oxycodone 5 mg tablet     pantoprazole (PROTONIX) 40 MG tablet Take 1 tablet (40 mg total) by mouth daily before breakfast. 30 tablet 3   Polyvinyl Alcohol-Povidone (MURINE TEARS FOR DRY EYES OP) Place 1-2 drops into both eyes as needed (for dry eyes).      psyllium (METAMUCIL) 58.6 % powder Take 1 packet by mouth at bedtime as needed (if no roughage is consumed that day- Mix and drink).      rosuvastatin (CRESTOR) 40 MG tablet Take 1 tablet (40 mg total) by mouth at bedtime. (Patient taking differently: Take 10 mg by mouth at bedtime. ) 60 tablet 2   Semaglutide, 1 MG/DOSE, (OZEMPIC, 1 MG/DOSE,) 2 MG/1.5ML SOPN Inject 0.75 mLs (1 mg total) into the skin once a week. 3 mL 0   sodium chloride (OCEAN) 0.65 % SOLN nasal spray Place 1 spray into both nostrils as needed for congestion.     tamsulosin (FLOMAX) 0.4 MG CAPS capsule TAKE 1 CAPSULE(0.4 MG) BY MOUTH DAILY (Patient taking  differently: Take 0.4 mg by mouth daily with lunch. ) 90 capsule 3   traMADol (ULTRAM) 50 MG tablet TAKE 1 TABLET BY MOUTH EVERY 12 HOURS AS NEEDED 60 tablet 1   No current facility-administered medications on file prior to visit.    Social History   Tobacco Use   Smoking status: Current Some Day Smoker    Types: Pipe   Smokeless tobacco: Former Systems developer    Quit date: 10/17/2015  Vaping Use   Vaping Use: Never used  Substance Use Topics   Alcohol use: Yes    Alcohol/week: 0.0 - 1.0 standard drinks    Comment: none last 24hrs   Drug use: No    Review of Systems  Constitutional: Negative for chills and fever.  Respiratory: Negative for cough and shortness of breath.   Cardiovascular: Negative for chest pain, palpitations and leg swelling.  Gastrointestinal: Negative for nausea and vomiting.  Musculoskeletal: Negative for back pain (resolved).      Objective:    BP 102/76 (BP Location: Left Arm, Patient Position: Sitting)    Pulse 73    Temp 98.3 F (36.8 C)    Ht 6' 0.4" (1.839 m)    Wt 203 lb 6.4 oz (92.3 kg)    SpO2 96%    BMI 27.28 kg/m  BP Readings from Last 3 Encounters:  08/18/19 102/76  07/14/19 124/68  07/12/19 98/72   Wt Readings from Last 3 Encounters:  08/18/19 203 lb 6.4 oz (92.3 kg)  07/27/19 202 lb 4.8 oz (91.8 kg)  07/14/19 200 lb 8 oz (90.9 kg)    Physical Exam Vitals reviewed.  Constitutional:      Appearance: He is well-developed.  Cardiovascular:     Rate and Rhythm: Regular rhythm.     Heart sounds: Normal heart sounds.  Pulmonary:     Effort: Pulmonary effort is normal. No respiratory distress.     Breath sounds: Normal breath sounds. No wheezing, rhonchi or rales.  Skin:    General: Skin is warm and dry.  Neurological:     Mental Status: He is alert.  Psychiatric:  Speech: Speech normal.        Behavior: Behavior normal.        Assessment & Plan:   Problem List Items Addressed This Visit      Cardiovascular and  Mediastinum   Essential hypertension    Stable, continue current regimen        Endocrine   Controlled type 2 diabetes mellitus with complication, without long-term current use of insulin (HCC)    Please as has improved.  We will recheck A1c at follow-up        Other   Chronic low back pain    Acute on chronic however at this time patient seems very pleased with his low back pain which has resolved.  He is recently seen Dr. Joselyn Arrow.  Will follow      Relevant Medications   oxyCODONE (OXY IR/ROXICODONE) 5 MG immediate release tablet   Mixed hyperlipidemia    Patient tolerating Crestor 10 mg compared to the 40 mg prior.  We will recheck lipid panel at follow-up          I am having [name] [name] "Suezanne Jacquet" maintain his aspirin EC, fluticasone, Omega-3 Fatty Acids (FISH OIL PO), multivitamin, psyllium, Polyvinyl Alcohol-Povidone (MURINE TEARS FOR DRY EYES OP), acetaminophen, ipratropium, tamsulosin, lisinopril, carvedilol, sodium chloride, hydrochlorothiazide, hydrocortisone cream, nitroGLYCERIN, rosuvastatin, clopidogrel, traMADol, glipiZIDE, ezetimibe, pantoprazole, Ozempic (1 MG/DOSE), OneTouch Verio, and oxyCODONE.   No orders of the defined types were placed in this encounter.   Return precautions given.   Risks, benefits, and alternatives of the medications and treatment plan prescribed today were discussed, and patient expressed understanding.   Education regarding symptom management and diagnosis given to patient on AVS.  Continue to follow with [name], [name] for routine health maintenance.   [name] [name] and I agreed with plan.   [name], [name]

## 2019-08-18 NOTE — Assessment & Plan Note (Signed)
Patient tolerating Crestor 10 mg compared to the 40 mg prior.  We will recheck lipid panel at follow-up

## 2019-08-18 NOTE — Patient Instructions (Addendum)
Nice to see you!   

## 2019-08-18 NOTE — Assessment & Plan Note (Signed)
Please as has improved.  We will recheck A1c at follow-up

## 2019-08-18 NOTE — Assessment & Plan Note (Signed)
Stable, continue current regimen 

## 2019-08-18 NOTE — Assessment & Plan Note (Signed)
Acute on chronic however at this time patient seems very pleased with his low back pain which has resolved.  He is recently seen Dr. Joselyn Arrow.  Will follow

## 2019-08-19 ENCOUNTER — Other Ambulatory Visit: Payer: Self-pay | Admitting: Cardiovascular Disease

## 2019-08-19 ENCOUNTER — Telehealth: Payer: Medicare Other

## 2019-08-19 ENCOUNTER — Other Ambulatory Visit: Payer: Self-pay | Admitting: Family

## 2019-08-19 ENCOUNTER — Ambulatory Visit: Payer: Medicare Other

## 2019-08-19 ENCOUNTER — Telehealth: Payer: Self-pay | Admitting: *Deleted

## 2019-08-19 DIAGNOSIS — E1165 Type 2 diabetes mellitus with hyperglycemia: Secondary | ICD-10-CM

## 2019-08-19 NOTE — Chronic Care Management (AMB) (Signed)
  Chronic Care Management   Note  08/19/2019 Name: Americo Vallery MRN: [id number] DOB: 1940/07/23  [name] [name] is a 79 y.o. year old male who is a primary care patient of Vidal Schwalbe, [name], [name] and is actively engaged with the care management team. I reached out to [name] by phone today to assist with re-scheduling a follow up visit with the Pharmacist.  Patient wanted me to make Pharmacist aware that he received his Ozempic will message Catie and make her aware.  Follow up plan: Telephone appointment with care management team member scheduled for: 09/03/2019  [place], [place], [place] [address] Direct Dial: [email or web address]: [email or web address]

## 2019-08-24 ENCOUNTER — Ambulatory Visit: Payer: Medicare Other

## 2019-08-25 ENCOUNTER — Ambulatory Visit: Payer: Medicare Other | Admitting: Urology

## 2019-08-25 ENCOUNTER — Other Ambulatory Visit: Payer: Self-pay

## 2019-08-25 ENCOUNTER — Encounter: Payer: Self-pay | Admitting: Urology

## 2019-08-25 VITALS — BP 121/80 | HR 43 | Ht 73.0 in | Wt 203.0 lb

## 2019-08-25 DIAGNOSIS — N138 Other obstructive and reflux uropathy: Secondary | ICD-10-CM

## 2019-08-25 DIAGNOSIS — N200 Calculus of kidney: Secondary | ICD-10-CM | POA: Diagnosis not present

## 2019-08-25 DIAGNOSIS — N281 Cyst of kidney, acquired: Secondary | ICD-10-CM | POA: Diagnosis not present

## 2019-08-25 DIAGNOSIS — N401 Enlarged prostate with lower urinary tract symptoms: Secondary | ICD-10-CM | POA: Diagnosis not present

## 2019-08-25 LAB — BLADDER SCAN AMB NON-IMAGING

## 2019-08-25 MED ORDER — TAMSULOSIN HCL 0.4 MG PO CAPS: ORAL_CAPSULE | ORAL | 3 refills | Status: DC

## 2019-08-25 NOTE — Progress Notes (Signed)
   08/25/2019 9:08 AM   [name] 1940-06-22 [id number]  Reason for visit: Follow up nephrolithiasis, right renal cyst, BPH  HPI: I saw Mr. Vivona back in urology clinic for the above urology issues. He is a 79 year old male with recurrent stone disease, with history of right ureteroscopy and laser lithotripsy with me in December 2019, Bosniak 86F 1 cm right renal cyst that has been stable on MRI over the last 4 years, and BPH with weak stream and feeling of incomplete emptying on Flomax. He denies any stone events since we saw him last. He denies any flank pain or gross hematuria. He had a repeat MRI on 08/18/2019 that shows a stable small Bosniak 86F cystic lesion in the right kidney, unchanged since 2018. He has a family history of kidney cancer in his father, and he would like to continue to monitor this cyst. We discussed the pros and cons of MRI versus ultrasound, and with the stability over the last 3 years, I think is very reasonable to obtain a yearly ultrasound and he is agreeable to this. We could always get an MRI in the future if there are changes noted on ultrasound. Regarding his BPH, he notes his symptoms are stable and his primary complaint is weak stream. He sits to void. PVR is high normal today at 150 mL. He continues on Flomax.  He also had an MI in June 2021 with placement of a stent in Plavix was started. He has also had some fractures and is on calcium supplementation.  We discussed general stone prevention strategies including adequate hydration with goal of producing 2.5 L of urine daily, increasing citric acid intake, increasing calcium intake during high oxalate meals, minimizing animal protein, and decreasing salt intake. Information about dietary recommendations given today.   Continue flomax for BPH RTC one year for renal US for Bosniak 86F right cyst surveillance Stone prevention strategies discussed  [name], MD  [place] [address], [place], [place] [address] [phone]

## 2019-08-25 NOTE — Patient Instructions (Signed)
Dietary Guidelines to Help Prevent Kidney Stones Kidney stones are deposits of minerals and salts that form inside your kidneys. Your risk of developing kidney stones may be greater depending on your diet, your lifestyle, the medicines you take, and whether you have certain medical conditions. Most people can reduce their chances of developing kidney stones by following the instructions below. Depending on your overall health and the type of kidney stones you tend to develop, your dietitian may give you more specific instructions. What are tips for following this plan? Reading food labels  Choose foods with "no salt added" or "low-salt" labels. Limit your sodium intake to less than 1500 mg per day.  Choose foods with calcium for each meal and snack. Try to eat about 300 mg of calcium at each meal. Foods that contain 200-500 mg of calcium per serving include: ? 8 oz (237 ml) of milk, fortified nondairy milk, and fortified fruit juice. ? 8 oz (237 ml) of kefir, yogurt, and soy yogurt. ? 4 oz (118 ml) of tofu. ? 1 oz of cheese. ? 1 cup (300 g) of dried figs. ? 1 cup (91 g) of cooked broccoli. ? 1-3 oz can of sardines or mackerel.  Most people need 1000 to 1500 mg of calcium each day. Talk to your dietitian about how much calcium is recommended for you. Shopping  Buy plenty of fresh fruits and vegetables. Most people do not need to avoid fruits and vegetables, even if they contain nutrients that may contribute to kidney stones.  When shopping for convenience foods, choose: ? Whole pieces of fruit. ? Premade salads with dressing on the side. ? Low-fat fruit and yogurt smoothies.  Avoid buying frozen meals or prepared deli foods.  Look for foods with live cultures, such as yogurt and kefir. Cooking  Do not add salt to food when cooking. Place a salt shaker on the table and allow each person to add his or her own salt to taste.  Use vegetable protein, such as beans, textured vegetable  protein (TVP), or tofu instead of meat in pasta, casseroles, and soups. Meal planning   Eat less salt, if told by your dietitian. To do this: ? Avoid eating processed or premade food. ? Avoid eating fast food.  Eat less animal protein, including cheese, meat, poultry, or fish, if told by your dietitian. To do this: ? Limit the number of times you have meat, poultry, fish, or cheese each week. Eat a diet free of meat at least 2 days a week. ? Eat only one serving each day of meat, poultry, fish, or seafood. ? When you prepare animal protein, cut pieces into small portion sizes. For most meat and fish, one serving is about the size of one deck of cards.  Eat at least 5 servings of fresh fruits and vegetables each day. To do this: ? Keep fruits and vegetables on hand for snacks. ? Eat 1 piece of fruit or a handful of berries with breakfast. ? Have a salad and fruit at lunch. ? Have two kinds of vegetables at dinner.  Limit foods that are high in a substance called oxalate. These include: ? Spinach. ? Rhubarb. ? Beets. ? Potato chips and french fries. ? Nuts.  If you regularly take a diuretic medicine, make sure to eat at least 1-2 fruits or vegetables high in potassium each day. These include: ? Avocado. ? Banana. ? Orange, prune, carrot, or tomato juice. ? Baked potato. ? Cabbage. ? Beans and split   peas. General instructions   Drink enough fluid to keep your urine clear or pale yellow. This is the most important thing you can do.  Talk to your health care provider and dietitian about taking daily supplements. Depending on your health and the cause of your kidney stones, you may be advised: ? Not to take supplements with vitamin C. ? To take a calcium supplement. ? To take a daily probiotic supplement. ? To take other supplements such as magnesium, fish oil, or vitamin B6.  Take all medicines and supplements as told by your health care provider.  Limit alcohol intake to no  more than 1 drink a day for nonpregnant women and 2 drinks a day for men. One drink equals 12 oz of beer, 5 oz of wine, or 1 oz of hard liquor.  Lose weight if told by your health care provider. Work with your dietitian to find strategies and an eating plan that works best for you. What foods are not recommended? Limit your intake of the following foods, or as told by your dietitian. Talk to your dietitian about specific foods you should avoid based on the type of kidney stones and your overall health. Grains Breads. Bagels. Rolls. Baked goods. Salted crackers. Cereal. Pasta. Vegetables Spinach. Rhubarb. Beets. Canned vegetables. [name] [name]. Olives. Meats and other protein foods Nuts. Nut butters. Large portions of meat, poultry, or fish. Salted or cured meats. Deli meats. Hot dogs. Sausages. Dairy Cheese. Beverages Regular soft drinks. Regular vegetable juice. Seasonings and other foods Seasoning blends with salt. Salad dressings. Canned soups. Soy sauce. Ketchup. Barbecue sauce. Canned pasta sauce. Casseroles. Pizza. Lasagna. Frozen meals. Potato chips. [place] fries. Summary  You can reduce your risk of kidney stones by making changes to your diet.  The most important thing you can do is drink enough fluid. You should drink enough fluid to keep your urine clear or pale yellow.  Ask your health care provider or dietitian how much protein from animal sources you should eat each day, and also how much salt and calcium you should have each day. This information is not intended to replace advice given to you by your health care provider. Make sure you discuss any questions you have with your health care provider. Document Revised: 04/22/2018 Document Reviewed: 12/12/2015 Elsevier Patient Education  [place].   Benign Prostatic Hyperplasia  Benign prostatic hyperplasia (BPH) is an enlarged prostate gland that is caused by the normal aging process and not by cancer. The prostate is  a walnut-sized gland that is involved in the production of semen. It is located in front of the rectum and below the bladder. The bladder stores urine and the urethra is the tube that carries the urine out of the body. The prostate may get bigger as a man gets older. An enlarged prostate can press on the urethra. This can make it harder to pass urine. The build-up of urine in the bladder can cause infection. Back pressure and infection may progress to bladder damage and kidney (renal) failure. What are the causes? This condition is part of a normal aging process. However, not all men develop problems from this condition. If the prostate enlarges away from the urethra, urine flow will not be blocked. If it enlarges toward the urethra and compresses it, there will be problems passing urine. What increases the risk? This condition is more likely to develop in men over the age of 53 years. What are the signs or symptoms? Symptoms of this condition  include:  Getting up often during the night to urinate.  Needing to urinate frequently during the day.  Difficulty starting urine flow.  Decrease in size and strength of your urine stream.  Leaking (dribbling) after urinating.  Inability to pass urine. This needs immediate treatment.  Inability to completely empty your bladder.  Pain when you pass urine. This is more common if there is also an infection.  Urinary tract infection (UTI). How is this diagnosed? This condition is diagnosed based on your medical history, a physical exam, and your symptoms. Tests will also be done, such as:  A post-void bladder scan. This measures any amount of urine that may remain in your bladder after you finish urinating.  A digital rectal exam. In a rectal exam, your health care provider checks your prostate by putting a lubricated, gloved finger into your rectum to feel the back of your prostate gland. This exam detects the size of your gland and any abnormal  lumps or growths.  An exam of your urine (urinalysis).  A prostate specific antigen (PSA) screening. This is a blood test used to screen for prostate cancer.  An ultrasound. This test uses sound waves to electronically produce a picture of your prostate gland. Your health care provider may refer you to a specialist in kidney and prostate diseases (urologist). How is this treated? Once symptoms begin, your health care provider will monitor your condition (active surveillance or watchful waiting). Treatment for this condition will depend on the severity of your condition. Treatment may include:  Observation and yearly exams. This may be the only treatment needed if your condition and symptoms are mild.  Medicines to relieve your symptoms, including: ? Medicines to shrink the prostate. ? Medicines to relax the muscle of the prostate.  Surgery in severe cases. Surgery may include: ? Prostatectomy. In this procedure, the prostate tissue is removed completely through an open incision or with a laparoscope or robotics. ? Transurethral resection of the prostate (TURP). In this procedure, a tool is inserted through the opening at the tip of the penis (urethra). It is used to cut away tissue of the inner core of the prostate. The pieces are removed through the same opening of the penis. This removes the blockage. ? Transurethral incision (TUIP). In this procedure, small cuts are made in the prostate. This lessens the prostate's pressure on the urethra. ? Transurethral microwave thermotherapy (TUMT). This procedure uses microwaves to create heat. The heat destroys and removes a small amount of prostate tissue. ? Transurethral needle ablation (TUNA). This procedure uses radio frequencies to destroy and remove a small amount of prostate tissue. ? Interstitial laser coagulation ([place]). This procedure uses a laser to destroy and remove a small amount of prostate tissue. ? Transurethral electrovaporization  (TUVP). This procedure uses electrodes to destroy and remove a small amount of prostate tissue. ? Prostatic urethral lift. This procedure inserts an implant to push the lobes of the prostate away from the urethra. Follow these instructions at home:  Take over-the-counter and prescription medicines only as told by your health care provider.  Monitor your symptoms for any changes. Contact your health care provider with any changes.  Avoid drinking large amounts of liquid before going to bed or out in public.  Avoid or reduce how much caffeine or alcohol you drink.  Give yourself time when you urinate.  Keep all follow-up visits as told by your health care provider. This is important. Contact a health care provider if:  You  have unexplained back pain.  Your symptoms do not get better with treatment.  You develop side effects from the medicine you are taking.  Your urine becomes very dark or has a bad smell.  Your lower abdomen becomes distended and you have trouble passing your urine. Get help right away if:  You have a fever or chills.  You suddenly cannot urinate.  You feel lightheaded, or very dizzy, or you faint.  There are large amounts of blood or clots in the urine.  Your urinary problems become hard to manage.  You develop moderate to severe low back or flank pain. The flank is the side of your body between the ribs and the hip. These symptoms may represent a serious problem that is an emergency. Do not wait to see if the symptoms will go away. Get medical help right away. Call your local emergency services (911 in the [place] Do not drive yourself to the hospital. Summary  Benign prostatic hyperplasia (BPH) is an enlarged prostate that is caused by the normal aging process and not by cancer.  An enlarged prostate can press on the urethra. This can make it hard to pass urine.  This condition is part of a normal aging process and is more likely to develop in men over  the age of 58 years.  Get help right away if you suddenly cannot urinate. This information is not intended to replace advice given to you by your health care provider. Make sure you discuss any questions you have with your health care provider. Document Revised: 11/25/2017 Document Reviewed: 02/05/2016 Elsevier Patient Education  2020 [employer].

## 2019-08-26 ENCOUNTER — Ambulatory Visit: Payer: Medicare Other

## 2019-08-31 ENCOUNTER — Ambulatory Visit: Payer: Medicare Other

## 2019-09-01 ENCOUNTER — Ambulatory Visit (INDEPENDENT_AMBULATORY_CARE_PROVIDER_SITE_OTHER): Payer: Medicare Other | Admitting: Pharmacist

## 2019-09-01 DIAGNOSIS — E782 Mixed hyperlipidemia: Secondary | ICD-10-CM | POA: Diagnosis not present

## 2019-09-01 DIAGNOSIS — I25118 Atherosclerotic heart disease of native coronary artery with other forms of angina pectoris: Secondary | ICD-10-CM

## 2019-09-01 DIAGNOSIS — E1165 Type 2 diabetes mellitus with hyperglycemia: Secondary | ICD-10-CM

## 2019-09-01 NOTE — Patient Instructions (Signed)
Visit Information  Goals Addressed              This Visit's Progress     Patient Stated   .  "I want to work on my blood sugars" (pt-stated)        [place] (see longtitudinal plan of care for additional care plan information)  Current Barriers:  . Social, community, and financial barriers:  o Reports he had an EKG at [facility] (in a post-COVID research study) . Diabetes: at goal; most recent Z6X 0.9%; complicated by CAD, BPH, recent hx NSTEMI . Current antihyperglycemic regimen: Ozempic 1 mg weekly, glipizide 10 mg QAM; does report some constipation o APPROVED for Ozempic assistance through [employer] through 01/14/20 o Has been unable to tolerate metformin, IR and ER, d/t diarrhea.  o Did not tolerate Jardiance . Denies hypoglycemia  . Current glucose readings:  o Checks in the mornings, after coffee usually - reports weekly averages 140-150s o After meals/middays 120-130s . Cardiovascular risk reduction; [facility], appointment tomorrow w/ Ignacia Bayley, NP; does report that he had a day where BP ranged 87/63 to 133/79, HR range 49 to 83, however he thinks he took 2 HCTZ 25 mg instead of 1.  o Current hypertensive regimen: HCTZ 25 mg QAM, lisinopril 10 mg QAM o Current hyperlipidemia regimen: rosuvastatin 10 mg daily, ezetimibe 10 mg daily - self-decreased from rosuvastatin 40 mg back to 10 mg, as he was worried his constipation and leg weakness was related to increasing the dose at time of hospitalization. However, today he notes no change in symptoms with decreasing rosuvastatin dose o Current antiplatelet therapy: ASA 81 mg daily, clopidogrel 75 mg daily (DAPT x 12 months then clopidogrel monotherapy) . Constipation: metamucil QPM, fiber bran flakes and V8 juice, notes this is helping manage well  Pharmacist Clinical Goal(s):  [name] Over the next 90 days, patient will work with PharmD and primary care provider to address optimized medication  management  Interventions: . Comprehensive medication review performed, medication list updated in electronic medical record . Inter-disciplinary care team collaboration (see longitudinal plan of care) . Continue Ozempic 1 mg weekly and glipizide 10 mg daily. Encouraged to continue to monitor for hypoglycemia. . Discussed importance of tight lipid control, especially post NSTEMI. Encouraged to discuss increasing rosuvastatin to 20 mg daily w/ cardiology tomorrow.   Patient Self Care Activities:  . Patient will check blood glucose BID , document, and provide at future appointments . Patient will take medications as prescribed . Patient will report any questions or concerns to provider   Please see past updates related to this goal by clicking on the "Past Updates" button in the selected goal         The patient verbalized understanding of instructions provided today and declined a print copy of patient instruction materials.   Plan:  - Scheduled f/u call in ~5 weeks  [name], PharmD, [place], [place] Pharmacist [place] [phone]

## 2019-09-01 NOTE — Chronic Care Management (AMB) (Signed)
Chronic Care Management   Follow Up Note   09/01/2019 Name: [name] [name] MRN: [id number] DOB: [birth year]  Referred by: [name], [name] Reason for referral : Chronic Care Management (Medication Management)   [name] [name] is a 79 y.o. year old male who is a primary care patient of [name], [name]. The CCM team was consulted for assistance with chronic disease management and care coordination needs.    Contacted patient for medication management review.   Review of patient status, including review of consultants reports, relevant laboratory and other test results, and collaboration with appropriate care team members and the patient's provider was performed as part of comprehensive patient evaluation and provision of chronic care management services.    SDOH (Social Determinants of Health) assessments performed: Yes See Care Plan activities for detailed interventions related to SDOH)  SDOH Interventions     Most Recent Value  SDOH Interventions  Financial Strain Interventions Other (Comment)       Outpatient Encounter Medications as of 09/01/2019  Medication Sig Note  . carvedilol (COREG) 3.125 MG tablet Take 3.125 mg by mouth 2 (two) times daily with a meal.   . clopidogrel (PLAVIX) 75 MG tablet Take 1 tablet (75 mg total) by mouth daily with breakfast.   . glipiZIDE (GLUCOTROL) 10 MG tablet Take 10 mg by mouth daily before breakfast.   . hydrochlorothiazide (HYDRODIURIL) 25 MG tablet TAKE 1 TABLET(25 MG) BY MOUTH DAILY   . lisinopril (ZESTRIL) 10 MG tablet TAKE 1 TABLET BY MOUTH DAILY 09/01/2019: QPM  . OZEMPIC, 1 MG/DOSE, 4 MG/3ML SOPN ADMINISTER 1 MG UNDER THE SKIN 1 TIME A WEEK   . psyllium (METAMUCIL) 58.6 % powder Take 1 packet by mouth at bedtime as needed (if no roughage is consumed that day- Mix and drink).    . rosuvastatin (CRESTOR) 10 MG tablet Take 10 mg by mouth daily.   [name] acetaminophen (TYLENOL) 650 MG CR tablet Take 650 mg by mouth 2 (two) times  daily as needed (for pain).    [name] aspirin EC 81 MG tablet Take 81 mg by mouth daily.    [name] ezetimibe (ZETIA) 10 MG tablet TAKE 1 TABLET(10 MG) BY MOUTH DAILY   . fluticasone (FLONASE) 50 MCG/ACT nasal spray Place 2 sprays into both nostrils daily.   . hydrocortisone cream 1 % Apply 1 application topically See admin instructions. Apply to affected area/rash every 3 days   . ipratropium (ATROVENT) 0.06 % nasal spray Place 2 sprays into both nostrils 3 (three) times daily.   . Multiple Vitamin (MULTIVITAMIN) capsule Take 1 capsule by mouth daily with lunch.    . nitroGLYCERIN (NITROSTAT) 0.4 MG SL tablet Place 1 tablet (0.4 mg total) under the tongue every 5 (five) minutes x 3 doses as needed for chest pain.   . Omega-3 Fatty Acids (FISH OIL PO) Take 1 capsule by mouth daily at 12 noon.    Glory Rosebush VERIO test strip 2 (two) times daily.   [name] oxyCODONE (OXY IR/ROXICODONE) 5 MG immediate release tablet oxycodone 5 mg tablet   . pantoprazole (PROTONIX) 40 MG tablet Take 1 tablet (40 mg total) by mouth daily before breakfast.   . Polyvinyl Alcohol-Povidone (MURINE TEARS FOR DRY EYES OP) Place 1-2 drops into both eyes as needed (for dry eyes).    . sodium chloride (OCEAN) 0.65 % SOLN nasal spray Place 1 spray into both nostrils as needed for congestion.   . tamsulosin (FLOMAX) 0.4 MG CAPS capsule TAKE 1 CAPSULE(0.4  MG) BY MOUTH DAILY   . traMADol (ULTRAM) 50 MG tablet TAKE 1 TABLET BY MOUTH EVERY 12 HOURS AS NEEDED    No facility-administered encounter medications on file as of 09/01/2019.     Objective:   Goals Addressed              This Visit's Progress     Patient Stated   .  "I want to work on my blood sugars" (pt-stated)        [place] (see longtitudinal plan of care for additional care plan information)  Current Barriers:  . Social, community, and financial barriers:  o Reports he had an EKG at [facility] (in a post-COVID research study) . Diabetes: at goal; most recent O4H 9.9%;  complicated by CAD, BPH, recent hx NSTEMI . Current antihyperglycemic regimen: Ozempic 1 mg weekly, glipizide 10 mg QAM; does report some constipation o APPROVED for Ozempic assistance through [employer] through 01/14/20 o Has been unable to tolerate metformin, IR and ER, d/t diarrhea.  o Did not tolerate Jardiance . Denies hypoglycemia  . Current glucose readings:  o Checks in the mornings, after coffee usually - reports weekly averages 140-150s o After meals/middays 120-130s . Cardiovascular risk reduction; [facility], appointment tomorrow w/ Ignacia Bayley, NP; does report that he had a day where BP ranged 87/63 to 133/79, HR range 49 to 83, however he thinks he took 2 HCTZ 25 mg instead of 1.  o Current hypertensive regimen: HCTZ 25 mg QAM, lisinopril 10 mg QAM o Current hyperlipidemia regimen: rosuvastatin 10 mg daily, ezetimibe 10 mg daily - self-decreased from rosuvastatin 40 mg back to 10 mg, as he was worried his constipation and leg weakness was related to increasing the dose at time of hospitalization. However, today he notes no change in symptoms with decreasing rosuvastatin dose o Current antiplatelet therapy: ASA 81 mg daily, clopidogrel 75 mg daily (DAPT x 12 months then clopidogrel monotherapy) . Constipation: metamucil QPM, fiber bran flakes and V8 juice, notes this is helping manage well  Pharmacist Clinical Goal(s):  [name] Over the next 90 days, patient will work with PharmD and primary care provider to address optimized medication management  Interventions: . Comprehensive medication review performed, medication list updated in electronic medical record . Inter-disciplinary care team collaboration (see longitudinal plan of care) . Continue Ozempic 1 mg weekly and glipizide 10 mg daily. Encouraged to continue to monitor for hypoglycemia. . Discussed importance of tight lipid control, especially post NSTEMI. Encouraged to discuss increasing rosuvastatin to 20 mg daily w/  cardiology tomorrow.   Patient Self Care Activities:  . Patient will check blood glucose BID , document, and provide at future appointments . Patient will take medications as prescribed . Patient will report any questions or concerns to provider   Please see past updates related to this goal by clicking on the "Past Updates" button in the selected goal          Plan:  - Scheduled f/u call in ~5 weeks  [name], PharmD, [place], [place] Pharmacist [place] [phone]

## 2019-09-02 ENCOUNTER — Other Ambulatory Visit: Payer: Self-pay

## 2019-09-02 ENCOUNTER — Ambulatory Visit: Payer: Medicare Other | Admitting: Nurse Practitioner

## 2019-09-02 ENCOUNTER — Ambulatory Visit: Payer: Medicare Other

## 2019-09-02 ENCOUNTER — Encounter: Payer: Self-pay | Admitting: Nurse Practitioner

## 2019-09-02 ENCOUNTER — Ambulatory Visit (INDEPENDENT_AMBULATORY_CARE_PROVIDER_SITE_OTHER): Payer: Medicare Other

## 2019-09-02 VITALS — BP 128/88 | HR 72 | Ht 73.0 in | Wt 199.4 lb

## 2019-09-02 DIAGNOSIS — I10 Essential (primary) hypertension: Secondary | ICD-10-CM

## 2019-09-02 DIAGNOSIS — I255 Ischemic cardiomyopathy: Secondary | ICD-10-CM

## 2019-09-02 DIAGNOSIS — I499 Cardiac arrhythmia, unspecified: Secondary | ICD-10-CM

## 2019-09-02 DIAGNOSIS — E785 Hyperlipidemia, unspecified: Secondary | ICD-10-CM

## 2019-09-02 DIAGNOSIS — I2510 Atherosclerotic heart disease of native coronary artery without angina pectoris: Secondary | ICD-10-CM

## 2019-09-02 MED ORDER — ROSUVASTATIN CALCIUM 40 MG PO TABS
20.0000 mg | ORAL_TABLET | Freq: Every day | ORAL | Status: DC
Start: 2019-09-02 — End: 2019-10-08

## 2019-09-02 NOTE — Progress Notes (Signed)
Office Visit    Patient Name: [name] [name] Date of Encounter: 09/02/2019  Primary Care Provider:  [name], [name] Primary Cardiologist:  Ida Rogue, MD  Chief Complaint    79 y/o ? with a h/o CAD, HTN, HL, DMII, and ICM (EF45-50% 06/2019), who presents for f/u of CAD s/p NSTEMI in 06/2019.  Past Medical History    Past Medical History:  Diagnosis Date  . Aortic insufficiency    a. noted on TTE 2015; b. 06/2019 Echo: AI not visualized.  . Arthritis   . CAD (coronary artery disease)    a. remote PCI in 1991 and 2005; b. MV 3/15: old inferior MI, no ischemia, LVEF 50%, slight inferior wall hypokniesis; c. 06/2019 NSTEMI/PCI: LM nl, LAD 80p/m (Atherectomy & 4.5x18 Resolute Onyx DES), 82m/d, D1 75 (PTCA), RI patent stent, LCX nl, RCA 100p, RPAV fills via L->R collats from LCX.  [name] Chicken pox   . Colon polyps    4 pre-cancerous   . Diverticulitis   . DM type 2 (diabetes mellitus, type 2) ([place])   . Family history of adverse reaction to anesthesia   . GERD (gastroesophageal reflux disease)   . Heart murmur   . History of kidney stones   . HOH (hard of hearing)   . Hyperlipidemia   . Hypertension   . Ischemic cardiomyopathy    a. TTE 2015: EF  50-55%, mild global HK; b. 06/2019 Echo: EF 45-50%, Gr1 DD, basal inf AK. Triv MR.  . Kidney stones   . Melanoma ([place]) 1980   Resected from his back  . Mitral regurgitation    a. noted on TTE 2015  . Myocardial infarction [facility])    Past Surgical History:  Procedure Laterality Date  . CHOLECYSTECTOMY  2012  . COLONOSCOPY     in 2003 with polyp removed and leak anastomosis had to have open abdominal surgery   . CORONARY ANGIOPLASTY WITH STENT PLACEMENT  1991 & 2005  . CORONARY ATHERECTOMY N/A 07/08/2019   Procedure: CORONARY ATHERECTOMY;  Surgeon: Jettie Booze, MD;  Location: [place] [facility];  Service: Cardiovascular;  Laterality: N/A;  . CORONARY BALLOON ANGIOPLASTY N/A 07/08/2019   Procedure: CORONARY BALLOON  ANGIOPLASTY;  Surgeon: Jettie Booze, MD;  Location: [place] [facility];  Service: Cardiovascular;  Laterality: N/A;  diagonal   . CORONARY STENT INTERVENTION N/A 07/08/2019   Procedure: CORONARY STENT INTERVENTION;  Surgeon: Jettie Booze, MD;  Location: [place] [facility];  Service: Cardiovascular;  Laterality: N/A;  lad  . CYSTOSCOPY/URETEROSCOPY/HOLMIUM LASER/STENT PLACEMENT Right 01/02/2018   Procedure: CYSTOSCOPY/URETEROSCOPY/HOLMIUM LASER/STENT PLACEMENT;  Surgeon: [name], MD;  Location: [facility];  Service: Urology;  Laterality: Right;  . ESOPHAGOGASTRODUODENOSCOPY (EGD) WITH PROPOFOL N/A 11/19/2016   Procedure: ESOPHAGOGASTRODUODENOSCOPY (EGD) WITH PROPOFOL;  Surgeon: [name], MD;  Location: [facility];  Service: Endoscopy;  Laterality: N/A;  . ESOPHAGOGASTRODUODENOSCOPY (EGD) WITH PROPOFOL N/A 02/02/2019   Procedure: ESOPHAGOGASTRODUODENOSCOPY (EGD) WITH PROPOFOL;  Surgeon: [name], MD;  Location: [facility];  Service: Endoscopy;  Laterality: N/A;  . INTRAVASCULAR PRESSURE WIRE/FFR STUDY N/A 07/07/2019   Procedure: INTRAVASCULAR PRESSURE WIRE/FFR STUDY;  Surgeon: Nelva Bush, MD;  Location: [place] [facility];  Service: Cardiovascular;  Laterality: N/A;  . INTRAVASCULAR ULTRASOUND/IVUS N/A 07/08/2019   Procedure: Intravascular Ultrasound/IVUS;  Surgeon: Jettie Booze, MD;  Location: [place] [facility];  Service: Cardiovascular;  Laterality: N/A;  . LEFT HEART CATH N/A 07/08/2019   Procedure: Left Heart Cath;  Surgeon: Jettie Booze, MD;  Location: [place] [facility];  Service: Cardiovascular;  Laterality: N/A;  . LEFT HEART CATH AND CORONARY ANGIOGRAPHY N/A 07/07/2019   Procedure: LEFT HEART CATH AND CORONARY ANGIOGRAPHY;  Surgeon: Nelva Bush, MD;  Location: [place] [facility];  Service: Cardiovascular;  Laterality: N/A;  . LITHOTRIPSY  2015  . MELANOMA EXCISION  1980   malignant  . NERVE SURGERY  2015   ulna nerve  .  TONSILLECTOMY  1945  . WISDOM TOOTH EXTRACTION      Allergies  Allergies  Allergen Reactions  . Metformin And Related Diarrhea and Other (See Comments)    Leg cramps, also  . Azithromycin Other (See Comments)    Not recommended - no reaction  . Other Other (See Comments)    If taking antibiotics for awhile, thrush results  . Percocet [Oxycodone-Acetaminophen] Other (See Comments)    Hallucinations  . Jardiance [Empagliflozin] Other (See Comments)    Tongue itching/reaction    History of Present Illness    79 year old male with above complex past medical history including coronary artery disease status post prior interventions with PTCA in 1991 and subsequent drug-eluting stent placement to the ramus intermedius in 2005.  Other history includes hypertension, hyperlipidemia, type 2 diabetes mellitus, melanoma, and ischemic cardiomyopathy.  In June of this year, he was admitted to [facility] after awaking with diaphoresis, weakness, and mild discomfort across his maxilla.  He was hypotensive on arrival and initial ECG showed tachycardia rate of 172 felt to represent SVT with aberrancy versus VT.  Rhythm broke after placement of an IV and he was subsequently stabilized.  High-sensitivity troponin increased to 794.  He underwent diagnostic catheterization revealing a chronic total occlusion of the right coronary artery and severe LAD and diagonal disease.  He was transferred to [facility] and underwent successful atherectomy of the LAD with placement of a drug-eluting stent.  The diagonal was treated with PTCA.  He was seen by electrophysiology who felt that wide-complex tachycardia represented VT in the setting of ischemia.  Echocardiogram showed an EF of 45-50% and continue beta-blocker therapy was recommended.  At his last visit on June 30, he said in retrospect, he was having elevated heart rates and diaphoretic spells for about 2 weeks prior to his admission.  He was offered a Zio  monitor for preferred to continue to watch things closely on his Fitbit, as he had not had any recurrent tachycardia since hospital discharge.  Since his last visit, he has done well from a cardiac standpoint.  He did try participating in cardiac rehab but after doing some exercises with light weights, had rib and back pain which she attributed to arthritis.  He follow-up with Ortho and was found to have compression fracture and was advised rest.  Instead of going back to cardiac rehab, he has been walking for about 25 minutes every evening without symptoms or limitations.  Since his last visit, he is also had some constipation that he thought might be attributed to the higher dose of Crestor.  He subsequently dropped the dose back to 10 mg daily, which he was taking prior to hospitalization.  Constipation has resolved though he thinks it may be related to increase fiber intake as well.  He would be willing to go back up to 20 mg daily.  Finally, he has noted some variability in heart rate and blood pressure at home.  A nurse at a different visit recently asked him if he  had A. fib because of irregularity noted when she was checking his blood pressure.  He does have a history of PVCs.  He sometimes notes heart rates in the low 100s when he gets up at night and is now interested in [place] monitor.  Home Medications    Prior to Admission medications   Medication Sig Start Date End Date Taking? Authorizing Provider  acetaminophen (TYLENOL) 650 MG CR tablet Take 650 mg by mouth 2 (two) times daily as needed (for pain).     [provider]  aspirin EC 81 MG tablet Take 81 mg by mouth daily.     [provider]  carvedilol (COREG) 3.125 MG tablet Take 3.125 mg by mouth 2 (two) times daily with a meal.    [provider]  clopidogrel (PLAVIX) 75 MG tablet Take 1 tablet (75 mg total) by mouth daily with breakfast. 07/10/19   [name], NP  ezetimibe (ZETIA) 10 MG tablet TAKE 1  TABLET(10 MG) BY MOUTH DAILY 08/06/19   [name], MD  fluticasone (FLONASE) 50 MCG/ACT nasal spray Place 2 sprays into both nostrils daily. 10/03/15   Coral Spikes, DO  glipiZIDE (GLUCOTROL) 10 MG tablet Take 10 mg by mouth daily before breakfast.    [provider]  hydrochlorothiazide (HYDRODIURIL) 25 MG tablet TAKE 1 TABLET(25 MG) BY MOUTH DAILY 08/20/19   [name], [name]  hydrocortisone cream 1 % Apply 1 application topically See admin instructions. Apply to affected area/rash every 3 days    [provider]  ipratropium (ATROVENT) 0.06 % nasal spray Place 2 sprays into both nostrils 3 (three) times daily. 10/23/18   McLean-Scocuzza, Nino Glow, MD  lisinopril (ZESTRIL) 10 MG tablet TAKE 1 TABLET BY MOUTH DAILY 02/26/19   [name], [name]  Multiple Vitamin (MULTIVITAMIN) capsule Take 1 capsule by mouth daily with lunch.     [provider]  nitroGLYCERIN (NITROSTAT) 0.4 MG SL tablet Place 1 tablet (0.4 mg total) under the tongue every 5 (five) minutes x 3 doses as needed for chest pain. 07/09/19   Tommie Raymond, NP  Omega-3 Fatty Acids (FISH OIL PO) Take 1 capsule by mouth daily at 12 noon.     [provider]  [facility] VERIO test strip 2 (two) times daily. 08/08/19   [provider]  oxyCODONE (OXY IR/ROXICODONE) 5 MG immediate release tablet oxycodone 5 mg tablet    [provider]  OZEMPIC, 1 MG/DOSE, 4 MG/3ML SOPN ADMINISTER 1 MG UNDER THE SKIN 1 TIME A WEEK 08/20/19   [name], [name]  pantoprazole (PROTONIX) 40 MG tablet Take 1 tablet (40 mg total) by mouth daily before breakfast. 08/06/19   [name], MD  Polyvinyl Alcohol-Povidone (MURINE TEARS FOR DRY EYES OP) Place 1-2 drops into both eyes as needed (for dry eyes).     [provider]  psyllium (METAMUCIL) 58.6 % powder Take 1 packet by mouth at bedtime as needed (if no roughage is consumed that day- Mix and drink).     [provider]    rosuvastatin (CRESTOR) 10 MG tablet Take 10 mg by mouth daily.    [provider]  sodium chloride (OCEAN) 0.65 % SOLN nasal spray Place 1 spray into both nostrils as needed for congestion.    [provider]  tamsulosin (FLOMAX) 0.4 MG CAPS capsule TAKE 1 CAPSULE(0.4 MG) BY MOUTH DAILY 08/25/19   [name], MD  traMADol (ULTRAM) 50 MG tablet  TAKE 1 TABLET BY MOUTH EVERY 12 HOURS AS NEEDED 07/14/19   [name], [name]    Review of Systems    He denies chest pain, dyspnea, palpitations, PND, orthopnea, dizziness, syncope, edema, or early satiety.  He did have constipation which has since resolved.  He has noted some irregularity to pulse but this has been asymptomatic.  All other systems reviewed and are otherwise negative except as noted above.  Physical Exam    VS:  BP 128/88   Pulse 72   Ht 6\' 1"  (1.854 m)   Wt 199 lb 6.4 oz (90.4 kg)   SpO2 97%   BMI 26.31 kg/m  , BMI Body mass index is 26.31 kg/m. GEN: Well nourished, well developed, in no acute distress. HEENT: normal. Neck: Supple, no JVD, carotid bruits, or masses. Cardiac: RRR, 2/6 systolic murmur heard throughout, loudest at the apex, no rubs, or gallops. No clubbing, cyanosis, edema.  Radials/PT 1+ and equal bilaterally.  Respiratory:  Respirations regular and unlabored, clear to auscultation bilaterally. GI: Soft, nontender, nondistended, BS + x 4. MS: no deformity or atrophy. Skin: warm and dry, no rash. Neuro:  Strength and sensation are intact. Psych: Normal affect.  Accessory Clinical Findings    ECG personally reviewed by me today -regular sinus rhythm, 72, left axis deviation- no acute changes.  Lab Results  Component Value Date   WBC 7.3 07/09/2019   HGB 14.6 07/09/2019   HCT 42.8 07/09/2019   MCV 88.4 07/09/2019   PLT 173 07/09/2019   Lab Results  Component Value Date   CREATININE 0.64 07/09/2019   BUN 10 07/09/2019   NA 137 07/09/2019   K 4.4 07/09/2019   CL 105  07/09/2019   CO2 23 07/09/2019   Lab Results  Component Value Date   ALT 41 07/07/2019   AST 47 (H) 07/07/2019   GGT 58 (H) 03/25/2019   ALKPHOS 55 07/07/2019   BILITOT 2.0 (H) 07/07/2019   Lab Results  Component Value Date   CHOL 120 02/10/2019   HDL 39.00 (L) 02/10/2019   LDLCALC 70 03/02/2018   LDLDIRECT 55.0 02/10/2019   TRIG 215.0 (H) 02/10/2019   CHOLHDL 3 02/10/2019    Lab Results  Component Value Date   HGBA1C 7.0 (H) 07/07/2019    Assessment & Plan    1.  Coronary artery disease: Status post non-STEMI in June with drug-eluting stent placement to the LAD and PTCA of the diagonal.  He has been doing well without chest pain or dyspnea.  He remains on aspirin, beta-blocker, Plavix, Zetia, and statin therapy.  He is walking 25 minutes every night.  He will reconsider enrollment in cardiac rehab.  2.  Irregular heart rhythm: Patient has noted some irregularity to his pulse from time to time and was also recently asked if he had a history of atrial fibrillation by a nurse, when she checked his blood pressure noted irregularly.  He previously reported elevated heart rates at night and at his last visit we discussed Zio monitoring.  He is now interested in pursuing monitoring and we will place today to rule out arrhythmias.  He does have a history of PVCs and I suspect that this is the culprit for his occasional irregularity.  3.  Essential hypertension: Stable on beta-blocker and ACE inhibitor therapy.  4.  Hyperlipidemia: LDL of 55 in January.  In the setting of non-STEMI, Crestor dose was escalated to 40 mg daily.  He subsequently developed constipation which has  since resolved after dropping back to 10 mg and also increasing fiber in his diet.  He would be willing to try 20 mg daily and we will proceed with this today.  He also remains on Zetia.  Plan to follow-up lipids and LFTs in 6 weeks as he has a prior history of mild LFT elevation.  6.  Type 2 diabetes mellitus: A1c 7.0  in June.  Managed by primary care.  7.  Disposition: Place a Zio monitor today for periodic irregular heart rhythm and mild elevation in rates.  Follow-up lipids and LFTs in 6 weeks.  Follow-up in clinic in 3 months or sooner if necessary.  Murray Hodgkins, NP 09/02/2019, 8:46 AM

## 2019-09-02 NOTE — Patient Instructions (Signed)
Medication Instructions:  1= INCREASE Crestor to 20 mg total once daily *If you need a refill on your cardiac medications before your next appointment, please call your pharmacy*   Lab Work: Your physician recommends that you return for lab work in: 6 weeks at the medical mall. You will need to be fasting.  No appt is needed. Hours are M-F 7AM- 6 PM.  If you have labs (blood work) drawn today and your tests are completely normal, you will receive your results only by: [name] MyChart Message (if you have MyChart) OR . A paper copy in the mail If you have any lab test that is abnormal or we need to change your treatment, we will call you to review the results.   Testing/Procedures: 1- A zio monitor was placed today. It will remain on for 14 days. You will then return monitor and event diary in provided box. It takes 1-2 weeks for report to be downloaded and returned to [place]. We will call you with the results. If monitor falls of or has orange flashing light, please call Zio for further instructions.    Follow-Up: At [facility], you and your health needs are our priority.  As part of our continuing mission to provide you with exceptional heart care, we have created designated Provider Care Teams.  These Care Teams include your primary Cardiologist (physician) and Advanced Practice Providers (APPs -  Physician Assistants and Nurse Practitioners) who all work together to provide you with the care you need, when you need it.  We recommend signing up for the patient portal called "MyChart".  Sign up information is provided on this After Visit Summary.  MyChart is used to connect with patients for Virtual Visits (Telemedicine).  Patients are able to view lab/test results, encounter notes, upcoming appointments, etc.  Non-urgent messages can be sent to your provider as well.   To learn more about what you can do with MyChart, go to [email or web address].    Your next appointment:   3 month(s)  The  format for your next appointment:   In Person  Provider:    You may see Ida Rogue, MD or Murray Hodgkins, NP

## 2019-09-03 ENCOUNTER — Telehealth: Payer: Medicare Other

## 2019-09-07 ENCOUNTER — Ambulatory Visit: Payer: Medicare Other

## 2019-09-07 NOTE — Progress Notes (Signed)
Agree with plan. Reviewed cardiology note and Gerald Stabs advised to increase crestor to 20mg .  [name], NP

## 2019-09-09 ENCOUNTER — Ambulatory Visit: Payer: Medicare Other

## 2019-09-10 ENCOUNTER — Other Ambulatory Visit: Payer: Self-pay | Admitting: Family

## 2019-09-10 DIAGNOSIS — M549 Dorsalgia, unspecified: Secondary | ICD-10-CM

## 2019-09-10 DIAGNOSIS — H40003 Preglaucoma, unspecified, bilateral: Secondary | ICD-10-CM | POA: Diagnosis not present

## 2019-09-10 DIAGNOSIS — G8929 Other chronic pain: Secondary | ICD-10-CM

## 2019-09-10 LAB — HM DIABETES EYE EXAM

## 2019-09-14 ENCOUNTER — Ambulatory Visit: Payer: Medicare Other

## 2019-09-16 ENCOUNTER — Ambulatory Visit: Payer: Medicare Other | Admitting: Dermatology

## 2019-09-16 ENCOUNTER — Ambulatory Visit: Payer: Medicare Other

## 2019-09-21 ENCOUNTER — Ambulatory Visit: Payer: Medicare Other

## 2019-09-23 ENCOUNTER — Ambulatory Visit: Payer: Medicare Other

## 2019-09-23 DIAGNOSIS — R002 Palpitations: Secondary | ICD-10-CM | POA: Diagnosis not present

## 2019-09-24 ENCOUNTER — Other Ambulatory Visit: Payer: Self-pay

## 2019-09-27 ENCOUNTER — Telehealth: Payer: Self-pay | Admitting: Family

## 2019-09-27 NOTE — Telephone Encounter (Signed)
Call pt We re'ced strange med list from patient.  Confused by this?  Is there some reason why this sent to [place]? Otherwise, will shred

## 2019-09-28 ENCOUNTER — Ambulatory Visit: Payer: Medicare Other

## 2019-09-28 NOTE — Telephone Encounter (Signed)
FYI I have spoken with patient's wife & these are lists fo medications showing what they have paid out of pocket this year. It is to give to Catie to help with patient assistance for Eliquis.   I have these & will give to Catie when she returns 9/15.

## 2019-09-30 ENCOUNTER — Ambulatory Visit: Payer: Medicare Other

## 2019-10-05 ENCOUNTER — Ambulatory Visit: Payer: Medicare Other

## 2019-10-05 ENCOUNTER — Telehealth: Payer: Self-pay

## 2019-10-05 DIAGNOSIS — I472 Ventricular tachycardia, unspecified: Secondary | ICD-10-CM

## 2019-10-05 DIAGNOSIS — I493 Ventricular premature depolarization: Secondary | ICD-10-CM

## 2019-10-05 NOTE — Telephone Encounter (Signed)
Attempted to call patient. [facility] 10/05/2019

## 2019-10-05 NOTE — Telephone Encounter (Signed)
-----   Message from Theora Gianotti, NP sent at 10/04/2019  8:35 AM EDT ----- Very frequent PVCs, accounting for 10% of all beats and 143 runs of nonsustained VT (longer runs of consecutive PVCs).  Far fewer runs of extra beats from the top portions of the heart (6 brief runs).  PVCs and NSVT very likely explain noted rise in HR and also irregularity.  No afib noted on this monitor, so reassuring from that standpoint.  Pls have him increased carvedilol to 6.25mg  BID. Also, with so much ventricular ectopy, pls have him f/u with EP.  Dr. Curt Bears saw him when he was @ [facility] over the summer, but perhaps we can keep his f/u local with Dr. Quentin Ore.

## 2019-10-07 ENCOUNTER — Ambulatory Visit: Payer: Medicare Other

## 2019-10-07 NOTE — Telephone Encounter (Signed)
Attempted to call patient. [facility] 10/07/2019

## 2019-10-08 ENCOUNTER — Other Ambulatory Visit: Payer: Self-pay | Admitting: Family

## 2019-10-08 ENCOUNTER — Other Ambulatory Visit: Payer: Self-pay

## 2019-10-08 MED ORDER — ROSUVASTATIN CALCIUM 20 MG PO TABS: 20.0000 mg | ORAL_TABLET | Freq: Every day | ORAL | Status: DC

## 2019-10-08 MED ORDER — CARVEDILOL 6.25 MG PO TABS
6.2500 mg | ORAL_TABLET | Freq: Two times a day (BID) | ORAL | 1 refills | Status: DC
Start: 2019-10-08 — End: 2019-11-09

## 2019-10-08 NOTE — Telephone Encounter (Signed)
Results called to pt. Pt verbalized understanding. He is agreeable to increase Carvedilol (Rx sent to pharmacy). He would prefer to f/u with Dr Quentin Ore here in [place] for EP. Routing to scheduling to reach out to patient to schedule appointment.

## 2019-10-08 NOTE — Telephone Encounter (Signed)
Patient is returning the call  

## 2019-10-12 ENCOUNTER — Ambulatory Visit: Payer: Medicare Other

## 2019-10-12 ENCOUNTER — Ambulatory Visit (INDEPENDENT_AMBULATORY_CARE_PROVIDER_SITE_OTHER): Payer: Medicare Other

## 2019-10-12 VITALS — Ht 73.0 in | Wt 199.0 lb

## 2019-10-12 DIAGNOSIS — Z0000 Encounter for general adult medical examination without abnormal findings: Secondary | ICD-10-CM

## 2019-10-12 NOTE — Progress Notes (Signed)
Electrophysiology Office Note:    Date:  10/13/2019   ID:  Mccormick [name] [name], DOB 1940-07-23, MRN [id number]  PCP:  [name], [name]  [facility] Cardiologist:  Ida Rogue, MD  [facility] Electrophysiologist:  [name] [name]   Referring MD: Wilder Glade*   Chief Complaint: VT  History of Present Illness:    [name] [name] is a 79 y.o. male who presents for an evaluation of VT at the request of [name] Bayley, PA-C. Their medical history includes CAD s/p PCI to LAD/D1/RI, HTN, HLD, DM2, ICM (EF45% in 06/2019). He was admitted to the hospital in June 2021 after waking up with diaphoresis and palpitations. He presented to the ED and was found to have a WCT in the [place]. EP consulted then and thought was related to VT in the setting of ischemia. During that admission, NSTEMI was diagnosed an a PCI to the LAD was performed. Since then, a zio monitor was ordered (results below) that showed 10% PVC burden and frequent NSVT. After the zio results came back, his coreg was doubled to 6.25mg  BID. This has significantly reduced the ectopy he was experiencing. He is active and walks > 1 block most days with his dog. He is tolerating the 6.25 of coreg. He sometimes experiences brief dizziness when he stands up from a supine position at night to go to the restroom.  Past Medical History:  Diagnosis Date  . Aortic insufficiency    a. noted on TTE 2015; b. 06/2019 Echo: AI not visualized.  . Arthritis   . CAD (coronary artery disease)    a. remote PCI in 1991 and 2005; b. MV 3/15: old inferior MI, no ischemia, LVEF 50%, slight inferior wall hypokniesis; c. 06/2019 NSTEMI/PCI: LM nl, LAD 80p/m (Atherectomy & 4.5x18 Resolute Onyx DES), 54m/d, D1 75 (PTCA), RI patent stent, LCX nl, RCA 100p, RPAV fills via L->R collats from LCX.  [name] Chicken pox   . Colon polyps    4 pre-cancerous   . Diverticulitis   . DM type 2 (diabetes mellitus, type 2) ([place])   . Family history of adverse reaction to  anesthesia   . GERD (gastroesophageal reflux disease)   . Heart murmur   . History of kidney stones   . HOH (hard of hearing)   . Hyperlipidemia   . Hypertension   . Ischemic cardiomyopathy    a. TTE 2015: EF  50-55%, mild global HK; b. 06/2019 Echo: EF 45-50%, Gr1 DD, basal inf AK. Triv MR.  . Kidney stones   . Melanoma ([name] [name]) 1980   Resected from his back  . Mitral regurgitation    a. noted on TTE 2015  . Myocardial infarction [facility])     Past Surgical History:  Procedure Laterality Date  . CHOLECYSTECTOMY  2012  . COLONOSCOPY     in 2003 with polyp removed and leak anastomosis had to have open abdominal surgery   . CORONARY ANGIOPLASTY WITH STENT PLACEMENT  1991 & 2005  . CORONARY ATHERECTOMY N/A 07/08/2019   Procedure: CORONARY ATHERECTOMY;  Surgeon: [name] Booze, MD;  Location: [place] [facility];  Service: Cardiovascular;  Laterality: N/A;  . CORONARY BALLOON ANGIOPLASTY N/A 07/08/2019   Procedure: CORONARY BALLOON ANGIOPLASTY;  Surgeon: [name] Booze, MD;  Location: [place] [facility];  Service: Cardiovascular;  Laterality: N/A;  diagonal   . CORONARY STENT INTERVENTION N/A 07/08/2019   Procedure: CORONARY STENT INTERVENTION;  Surgeon: [name] Booze, MD;  Location: [place] [facility];  Service:  Cardiovascular;  Laterality: N/A;  lad  . CYSTOSCOPY/URETEROSCOPY/HOLMIUM LASER/STENT PLACEMENT Right 01/02/2018   Procedure: CYSTOSCOPY/URETEROSCOPY/HOLMIUM LASER/STENT PLACEMENT;  Surgeon: [name], MD;  Location: [facility];  Service: Urology;  Laterality: Right;  . ESOPHAGOGASTRODUODENOSCOPY (EGD) WITH PROPOFOL N/A 11/19/2016   Procedure: ESOPHAGOGASTRODUODENOSCOPY (EGD) WITH PROPOFOL;  Surgeon: [name], MD;  Location: [facility];  Service: Endoscopy;  Laterality: N/A;  . ESOPHAGOGASTRODUODENOSCOPY (EGD) WITH PROPOFOL N/A 02/02/2019   Procedure: ESOPHAGOGASTRODUODENOSCOPY (EGD) WITH PROPOFOL;  Surgeon: [name], MD;  Location: [facility];   Service: Endoscopy;  Laterality: N/A;  . INTRAVASCULAR PRESSURE WIRE/FFR STUDY N/A 07/07/2019   Procedure: INTRAVASCULAR PRESSURE WIRE/FFR STUDY;  Surgeon: Nelva Bush, MD;  Location: [place] [facility];  Service: Cardiovascular;  Laterality: N/A;  . INTRAVASCULAR ULTRASOUND/IVUS N/A 07/08/2019   Procedure: Intravascular Ultrasound/IVUS;  Surgeon: [name] Booze, MD;  Location: [place] [facility];  Service: Cardiovascular;  Laterality: N/A;  . LEFT HEART CATH N/A 07/08/2019   Procedure: Left Heart Cath;  Surgeon: [name] Booze, MD;  Location: [place] [facility];  Service: Cardiovascular;  Laterality: N/A;  . LEFT HEART CATH AND CORONARY ANGIOGRAPHY N/A 07/07/2019   Procedure: LEFT HEART CATH AND CORONARY ANGIOGRAPHY;  Surgeon: Nelva Bush, MD;  Location: [place] [facility];  Service: Cardiovascular;  Laterality: N/A;  . LITHOTRIPSY  2015  . MELANOMA EXCISION  1980   malignant  . NERVE SURGERY  2015   ulna nerve  . TONSILLECTOMY  1945  . WISDOM TOOTH EXTRACTION      Current Medications: Current Meds  Medication Sig  . acetaminophen (TYLENOL) 650 MG CR tablet Take 650 mg by mouth 2 (two) times daily as needed (for pain).   [name] aspirin EC 81 MG tablet Take 81 mg by mouth daily.   . carvedilol (COREG) 6.25 MG tablet Take 1 tablet (6.25 mg total) by mouth 2 (two) times daily.  . clopidogrel (PLAVIX) 75 MG tablet Take 1 tablet (75 mg total) by mouth daily with breakfast.  . ezetimibe (ZETIA) 10 MG tablet TAKE 1 TABLET(10 MG) BY MOUTH DAILY  . fluticasone (FLONASE) 50 MCG/ACT nasal spray Place 2 sprays into both nostrils daily.  [name] glipiZIDE (GLUCOTROL) 10 MG tablet Take 10 mg by mouth daily before breakfast.  . hydrochlorothiazide (HYDRODIURIL) 25 MG tablet TAKE 1 TABLET(25 MG) BY MOUTH DAILY  . hydrocortisone cream 1 % Apply 1 application topically See admin instructions. Apply to affected area/rash every 3 days  . ipratropium (ATROVENT) 0.06 % nasal spray Place 2 sprays  into both nostrils 3 (three) times daily.  [name] lisinopril (ZESTRIL) 10 MG tablet TAKE 1 TABLET BY MOUTH DAILY  . Multiple Vitamin (MULTIVITAMIN) capsule Take 1 capsule by mouth daily with lunch.   . nitroGLYCERIN (NITROSTAT) 0.4 MG SL tablet Place 1 tablet (0.4 mg total) under the tongue every 5 (five) minutes x 3 doses as needed for chest pain.  . Omega-3 Fatty Acids (FISH OIL PO) Take 1 capsule by mouth daily at 12 noon.   Glory Rosebush VERIO test strip 2 (two) times daily.  [name] oxyCODONE (OXY IR/ROXICODONE) 5 MG immediate release tablet oxycodone 5 mg tablet  . OZEMPIC, 1 MG/DOSE, 4 MG/3ML SOPN ADMINISTER 1 MG UNDER THE SKIN 1 TIME A WEEK  . pantoprazole (PROTONIX) 40 MG tablet Take 1 tablet (40 mg total) by mouth daily before breakfast.  . Polyvinyl Alcohol-Povidone (MURINE TEARS FOR DRY EYES OP) Place 1-2 drops into both eyes as needed (for dry eyes).   . psyllium (  METAMUCIL) 58.6 % powder Take 1 packet by mouth at bedtime as needed (if no roughage is consumed that day- Mix and drink).   . sodium chloride (OCEAN) 0.65 % SOLN nasal spray Place 1 spray into both nostrils as needed for congestion.  . tamsulosin (FLOMAX) 0.4 MG CAPS capsule TAKE 1 CAPSULE(0.4 MG) BY MOUTH DAILY  . traMADol (ULTRAM) 50 MG tablet TAKE 1 TABLET BY MOUTH EVERY 12 HOURS AS NEEDED  . [DISCONTINUED] rosuvastatin (CRESTOR) 40 MG tablet Take 20 mg by mouth daily.     Allergies:   Metformin and related, Azithromycin, Other, Percocet [oxycodone-acetaminophen], and Jardiance [empagliflozin]   Social History   Socioeconomic History  . Marital status: Married    Spouse name: Not on file  . Number of children: Not on file  . Years of education: Not on file  . Highest education level: Not on file  Occupational History  . Not on file  Tobacco Use  . Smoking status: Current Some Day Smoker    Types: Pipe  . Smokeless tobacco: Former Systems developer    Quit date: 10/17/2015  Vaping Use  . Vaping Use: Never used  Substance and Sexual  Activity  . Alcohol use: Not Currently    Alcohol/week: 0.0 - 1.0 standard drinks  . Drug use: No  . Sexual activity: Not on file  Other Topics Concern  . Not on file  Social History Narrative   Married    Social Determinants of Health   Financial Resource Strain: Low Risk   . Difficulty of Paying Living Expenses: Not very hard  Food Insecurity:   . Worried About Charity fundraiser in the Last Year: Not on file  . Ran Out of Food in the Last Year: Not on file  Transportation Needs:   . Lack of Transportation (Medical): Not on file  . Lack of Transportation (Non-Medical): Not on file  Physical Activity:   . Days of Exercise per Week: Not on file  . Minutes of Exercise per Session: Not on file  Stress:   . Feeling of Stress : Not on file  Social Connections:   . Frequency of Communication with Friends and Family: Not on file  . Frequency of Social Gatherings with Friends and Family: Not on file  . Attends Religious Services: Not on file  . Active Member of Clubs or Organizations: Not on file  . Attends Archivist Meetings: Not on file  . Marital Status: Not on file     Family History: The patient's family history includes Colon cancer in his father; Diabetes in his father and mother; Heart attack in his mother; Heart disease in his mother; Hyperlipidemia in his mother; Hypertension in his mother; Kidney cancer in his father; Liver cancer in his father; Lung cancer in his father. There is no history of Bladder Cancer or Prostate cancer.  ROS:   Please see the history of present illness.    All other systems reviewed and are negative.  EKGs/Labs/Other Studies Reviewed:    The following studies were reviewed today: Zio, echo, hospitalization records  09/24/2019 Zio personally reviewed 143 VT episodes, fastest 203bpm with 17 beats. There are at least 2 morphologies of the NSVT which differ from the PVC. 10% PVC burden, 1 morphology with >9% burden and at least 2  other morphologies with < 1% burden  07/07/2019 Echo personally reviewed EF 45% Akinesis of the basal inferior wall No significant valvular abnormalities visualized on this study  09/02/2019 ECG personally  reviewed     07/07/2019 ECG from previous hospitalization     EKG:  The ekg ordered today demonstrates sinus rhythm. No PVCs. LVH.   Recent Labs: 07/07/2019: ALT 41; B Natriuretic Peptide 111.1; Magnesium 1.4 07/09/2019: BUN 10; Creatinine, Ser 0.64; Hemoglobin 14.6; Platelets 173; Potassium 4.4; Sodium 137  Recent Lipid Panel    Component Value Date/Time   CHOL 120 02/10/2019 0903   TRIG 215.0 (H) 02/10/2019 0903   HDL 39.00 (L) 02/10/2019 0903   CHOLHDL 3 02/10/2019 0903   VLDL 43.0 (H) 02/10/2019 0903   LDLCALC 70 03/02/2018 0822   LDLDIRECT 55.0 02/10/2019 0903    Physical Exam:    VS:  BP 120/82   Pulse 71   Ht 6\' 1"  (1.854 m)   Wt 203 lb (92.1 kg)   BMI 26.78 kg/m     Wt Readings from Last 3 Encounters:  10/13/19 203 lb (92.1 kg)  10/12/19 199 lb (90.3 kg)  09/02/19 199 lb 6.4 oz (90.4 kg)     GEN:  Well nourished, well developed in no acute distress HEENT: Normal NECK: No JVD; No carotid bruits LYMPHATICS: No lymphadenopathy CARDIAC: RRR, no murmurs, rubs, gallops RESPIRATORY:  Clear to auscultation without rales, wheezing or rhonchi  ABDOMEN: Soft, non-tender, non-distended MUSCULOSKELETAL:  No edema; No deformity  SKIN: Warm and dry NEUROLOGIC:  Alert and oriented x 3 PSYCHIATRIC:  Normal affect   ASSESSMENT:    1. Chronic systolic heart failure ([place])   2. Ischemic cardiomyopathy   3. PVC (premature ventricular contraction)   4. NSVT (nonsustained ventricular tachycardia) ([facility])    PLAN:    In order of problems listed above:  1. Chronic systolic heart failure/ischemic cardiomyopathy Patient has significant coronary artery disease and ischemic cardiomyopathy with his last ejection fraction measured at 45%. He is on good medical therapy for  his cardiomyopathy including Coreg, lisinopril. For his coronary disease he is taking aspirin and clopidogrel. Given the time that his past since his atherectomy and PCI, I would like to reassess his left ventricular function. This also may help further stratify his risk for sudden cardiac death.  2. PVC and nonsustained ventricular tachycardia Patient has a significant burden of PVCs (10%) and NSVT on his recent ZIO. These are likely secondary to his coronary artery disease and ischemic cardiomyopathy but they also may be contributing to his LV dysfunction. The ectopy has been less since increasing his coreg.  I would like to further stratify his risk for sudden cardiac death given his burden of ventricular ectopy and NSVT. To do this, I would like to start with a cardiac MRI to quantify the burden of scarring in the left ventricle and to reassess his left ventricular function. Depending on the results of the MRI, will consider ICD vs EP study vs continued medical therapy.  F/U 6-8 weeks.    Medication Adjustments/Labs and Tests Ordered: Current medicines are reviewed at length with the patient today.  Concerns regarding medicines are outlined above.  Orders Placed This Encounter  Procedures  . MR Card Morphology Wo/W Cm  . EKG 12-Lead   Meds ordered this encounter  Medications  . rosuvastatin (CRESTOR) 20 MG tablet    Sig: Take 1 tablet (20 mg total) by mouth daily.    Dispense:  90 tablet    Refill:  3     Signed, Lars Mage, MD, [facility]  10/13/2019 1:19 PM    Electrophysiology [facility]

## 2019-10-12 NOTE — Progress Notes (Addendum)
Subjective:   Maylon Sailors is a 79 y.o. male who presents for Medicare Annual/Subsequent preventive examination.  Review of Systems    No ROS.  Medicare Wellness Virtual Visit.    Cardiac Risk Factors include: advanced age (>2men, >46 women);diabetes mellitus;male gender     Objective:    Today's Vitals   10/12/19 0936  Weight: 199 lb (90.3 kg)  Height: 6\' 1"  (1.854 m)   Body mass index is 26.25 kg/m.  Advanced Directives 10/12/2019 07/23/2019 07/07/2019 07/07/2019 07/07/2019 02/02/2019 10/06/2018  Does Patient Have a Medical Advance Directive? Yes Yes Yes Yes No Yes Yes  Type of Paramedic of [place];Living will [place];Living will [place];Living will [place];Living will - [place];Living will [place];Living will  Does patient want to make changes to medical advance directive? No - Patient declined No - Patient declined No - Patient declined No - Patient declined - - No - Patient declined  Copy of [place] in Chart? No - copy requested No - copy requested No - copy requested - - No - copy requested No - copy requested  Would patient like information on creating a medical advance directive? - No - Patient declined No - Patient declined No - Patient declined No - Patient declined - -    Current Medications (verified) Outpatient Encounter Medications as of 10/12/2019  Medication Sig  . acetaminophen (TYLENOL) 650 MG CR tablet Take 650 mg by mouth 2 (two) times daily as needed (for pain).   [name] aspirin EC 81 MG tablet Take 81 mg by mouth daily.   . carvedilol (COREG) 6.25 MG tablet Take 1 tablet (6.25 mg total) by mouth 2 (two) times daily.  . clopidogrel (PLAVIX) 75 MG tablet Take 1 tablet (75 mg total) by mouth daily with breakfast.  . ezetimibe (ZETIA) 10 MG tablet TAKE 1 TABLET(10 MG) BY MOUTH DAILY  . fluticasone (FLONASE) 50 MCG/ACT  nasal spray Place 2 sprays into both nostrils daily.  [name] glipiZIDE (GLUCOTROL) 10 MG tablet Take 10 mg by mouth daily before breakfast.  . hydrochlorothiazide (HYDRODIURIL) 25 MG tablet TAKE 1 TABLET(25 MG) BY MOUTH DAILY  . hydrocortisone cream 1 % Apply 1 application topically See admin instructions. Apply to affected area/rash every 3 days  . ipratropium (ATROVENT) 0.06 % nasal spray Place 2 sprays into both nostrils 3 (three) times daily.  [name] lisinopril (ZESTRIL) 10 MG tablet TAKE 1 TABLET BY MOUTH DAILY  . Multiple Vitamin (MULTIVITAMIN) capsule Take 1 capsule by mouth daily with lunch.   . nitroGLYCERIN (NITROSTAT) 0.4 MG SL tablet Place 1 tablet (0.4 mg total) under the tongue every 5 (five) minutes x 3 doses as needed for chest pain.  . Omega-3 Fatty Acids (FISH OIL PO) Take 1 capsule by mouth daily at 12 noon.   Glory Rosebush VERIO test strip 2 (two) times daily.  [name] oxyCODONE (OXY IR/ROXICODONE) 5 MG immediate release tablet oxycodone 5 mg tablet  . OZEMPIC, 1 MG/DOSE, 4 MG/3ML SOPN ADMINISTER 1 MG UNDER THE SKIN 1 TIME A WEEK  . pantoprazole (PROTONIX) 40 MG tablet Take 1 tablet (40 mg total) by mouth daily before breakfast.  . Polyvinyl Alcohol-Povidone (MURINE TEARS FOR DRY EYES OP) Place 1-2 drops into both eyes as needed (for dry eyes).   . psyllium (METAMUCIL) 58.6 % powder Take 1 packet by mouth at bedtime as needed (if no roughage is consumed that day-  Mix and drink).   . rosuvastatin (CRESTOR) 20 MG tablet Take 1 tablet (20 mg total) by mouth daily.  . sodium chloride (OCEAN) 0.65 % SOLN nasal spray Place 1 spray into both nostrils as needed for congestion.  . tamsulosin (FLOMAX) 0.4 MG CAPS capsule TAKE 1 CAPSULE(0.4 MG) BY MOUTH DAILY  . traMADol (ULTRAM) 50 MG tablet TAKE 1 TABLET BY MOUTH EVERY 12 HOURS AS NEEDED   No facility-administered encounter medications on file as of 10/12/2019.    Allergies (verified) Metformin and related, Azithromycin, Other, Percocet  [oxycodone-acetaminophen], and Jardiance [empagliflozin]   History: Past Medical History:  Diagnosis Date  . Aortic insufficiency    a. noted on TTE 2015; b. 06/2019 Echo: AI not visualized.  . Arthritis   . CAD (coronary artery disease)    a. remote PCI in 1991 and 2005; b. MV 3/15: old inferior MI, no ischemia, LVEF 50%, slight inferior wall hypokniesis; c. 06/2019 NSTEMI/PCI: LM nl, LAD 80p/m (Atherectomy & 4.5x18 Resolute Onyx DES), 86m/d, D1 75 (PTCA), RI patent stent, LCX nl, RCA 100p, RPAV fills via L->R collats from LCX.  [name] Chicken pox   . Colon polyps    4 pre-cancerous   . Diverticulitis   . DM type 2 (diabetes mellitus, type 2) ([place])   . Family history of adverse reaction to anesthesia   . GERD (gastroesophageal reflux disease)   . Heart murmur   . History of kidney stones   . HOH (hard of hearing)   . Hyperlipidemia   . Hypertension   . Ischemic cardiomyopathy    a. TTE 2015: EF  50-55%, mild global HK; b. 06/2019 Echo: EF 45-50%, Gr1 DD, basal inf AK. Triv MR.  . Kidney stones   . Melanoma ([place]) 1980   Resected from his back  . Mitral regurgitation    a. noted on TTE 2015  . Myocardial infarction [facility])    Past Surgical History:  Procedure Laterality Date  . CHOLECYSTECTOMY  2012  . COLONOSCOPY     in 2003 with polyp removed and leak anastomosis had to have open abdominal surgery   . CORONARY ANGIOPLASTY WITH STENT PLACEMENT  1991 & 2005  . CORONARY ATHERECTOMY N/A 07/08/2019   Procedure: CORONARY ATHERECTOMY;  Surgeon: Jettie Booze, MD;  Location: [place] [facility];  Service: Cardiovascular;  Laterality: N/A;  . CORONARY BALLOON ANGIOPLASTY N/A 07/08/2019   Procedure: CORONARY BALLOON ANGIOPLASTY;  Surgeon: Jettie Booze, MD;  Location: [place] [facility];  Service: Cardiovascular;  Laterality: N/A;  diagonal   . CORONARY STENT INTERVENTION N/A 07/08/2019   Procedure: CORONARY STENT INTERVENTION;  Surgeon: Jettie Booze, MD;  Location: [place] [facility];  Service: Cardiovascular;  Laterality: N/A;  lad  . CYSTOSCOPY/URETEROSCOPY/HOLMIUM LASER/STENT PLACEMENT Right 01/02/2018   Procedure: CYSTOSCOPY/URETEROSCOPY/HOLMIUM LASER/STENT PLACEMENT;  Surgeon: [name], MD;  Location: [facility];  Service: Urology;  Laterality: Right;  . ESOPHAGOGASTRODUODENOSCOPY (EGD) WITH PROPOFOL N/A 11/19/2016   Procedure: ESOPHAGOGASTRODUODENOSCOPY (EGD) WITH PROPOFOL;  Surgeon: [name], MD;  Location: [facility];  Service: Endoscopy;  Laterality: N/A;  . ESOPHAGOGASTRODUODENOSCOPY (EGD) WITH PROPOFOL N/A 02/02/2019   Procedure: ESOPHAGOGASTRODUODENOSCOPY (EGD) WITH PROPOFOL;  Surgeon: [name], MD;  Location: [facility];  Service: Endoscopy;  Laterality: N/A;  . INTRAVASCULAR PRESSURE WIRE/FFR STUDY N/A 07/07/2019   Procedure: INTRAVASCULAR PRESSURE WIRE/FFR STUDY;  Surgeon: Nelva Bush, MD;  Location: [place] [facility];  Service: Cardiovascular;  Laterality: N/A;  . INTRAVASCULAR ULTRASOUND/IVUS N/A 07/08/2019  Procedure: Intravascular Ultrasound/IVUS;  Surgeon: Jettie Booze, MD;  Location: [place] [facility];  Service: Cardiovascular;  Laterality: N/A;  . LEFT HEART CATH N/A 07/08/2019   Procedure: Left Heart Cath;  Surgeon: Jettie Booze, MD;  Location: [place] [facility];  Service: Cardiovascular;  Laterality: N/A;  . LEFT HEART CATH AND CORONARY ANGIOGRAPHY N/A 07/07/2019   Procedure: LEFT HEART CATH AND CORONARY ANGIOGRAPHY;  Surgeon: Nelva Bush, MD;  Location: [place] [facility];  Service: Cardiovascular;  Laterality: N/A;  . LITHOTRIPSY  2015  . MELANOMA EXCISION  1980   malignant  . NERVE SURGERY  2015   ulna nerve  . TONSILLECTOMY  1945  . WISDOM TOOTH EXTRACTION     Family History  Problem Relation Age of Onset  . Hyperlipidemia Mother   . Hypertension Mother   . Heart disease Mother   . Diabetes Mother   . Heart attack Mother   . Colon cancer Father   . Lung cancer Father   .  Kidney cancer Father        malignant capsulated kidney tumor  . Diabetes Father   . Liver cancer Father   . Bladder Cancer Neg Hx   . Prostate cancer Neg Hx    Social History   Socioeconomic History  . Marital status: Married    Spouse name: Not on file  . Number of children: Not on file  . Years of education: Not on file  . Highest education level: Not on file  Occupational History  . Not on file  Tobacco Use  . Smoking status: Current Some Day Smoker    Types: Pipe  . Smokeless tobacco: Former Systems developer    Quit date: 10/17/2015  Vaping Use  . Vaping Use: Never used  Substance and Sexual Activity  . Alcohol use: Not Currently    Alcohol/week: 0.0 - 1.0 standard drinks  . Drug use: No  . Sexual activity: Not on file  Other Topics Concern  . Not on file  Social History Narrative   Married    Social Determinants of Health   Financial Resource Strain: Low Risk   . Difficulty of Paying Living Expenses: Not very hard  Food Insecurity:   . Worried About Charity fundraiser in the Last Year: Not on file  . Ran Out of Food in the Last Year: Not on file  Transportation Needs:   . Lack of Transportation (Medical): Not on file  . Lack of Transportation (Non-Medical): Not on file  Physical Activity:   . Days of Exercise per Week: Not on file  . Minutes of Exercise per Session: Not on file  Stress:   . Feeling of Stress : Not on file  Social Connections:   . Frequency of Communication with Friends and Family: Not on file  . Frequency of Social Gatherings with Friends and Family: Not on file  . Attends Religious Services: Not on file  . Active Member of Clubs or Organizations: Not on file  . Attends Archivist Meetings: Not on file  . Marital Status: Not on file    Tobacco Counseling Ready to quit: Not Answered Counseling given: Not Answered   Clinical Intake:  Pre-visit preparation completed: Yes        Diabetes: Yes (Followed by pcp)  How often do you  need to have someone help you when you read instructions, pamphlets, or other written materials from your doctor or pharmacy?: 1 - Never Interpreter Needed?: No  Activities of Daily Living In your present state of health, do you have any difficulty performing the following activities: 10/12/2019 07/07/2019  Hearing? Y -  Comment Hearing aids -  Vision? N -  Difficulty concentrating or making decisions? N -  Walking or climbing stairs? N -  Dressing or bathing? N -  Doing errands, shopping? N N  Preparing Food and eating ? N -  Using the Toilet? N -  In the past six months, have you accidently leaked urine? N -  Do you have problems with loss of bowel control? N -  Managing your Medications? N -  Managing your Finances? N -  Housekeeping or managing your Housekeeping? N -  Some recent data might be hidden    Patient Care Team: [name], [name] as PCP - General (Family Medicine) [name], MD as PCP - Cardiology (Cardiology) De Hollingshead, RPH-CPP as Pharmacist (Pharmacist)  Indicate any recent Medical Services you may have received from other than [facility] providers in the past year (date may be approximate).     Assessment:   This is a routine wellness examination for Aadan.  I connected with Ilya today by telephone and verified that I am speaking with the correct person using two identifiers. Location patient: home Location provider: work Persons participating in the virtual visit: patient, Marine scientist.    I discussed the limitations, risks, security and privacy concerns of performing an evaluation and management service by telephone and the availability of in person appointments. The patient expressed understanding and verbally consented to this telephonic visit.    Interactive audio and video telecommunications were attempted between this provider and patient, however failed, due to patient having technical difficulties OR patient did not have access  to video capability.  We continued and completed visit with audio only.  Some vital signs may be absent or patient reported.   Hearing/Vision screen  Hearing Screening   125Hz  250Hz  500Hz  1000Hz  2000Hz  3000Hz  4000Hz  6000Hz  8000Hz   Right ear:           Left ear:           Comments: Hearing aids  Vision Screening Comments: Wears corrective lenses  Visual acuity not assessed, virtual visit.  No retinopathy reported.  They have seen their ophthalmologist in the last 12 months.   Dietary issues and exercise activities discussed: Current Exercise Habits: Home exercise routine, Type of exercise: walking, Intensity: Mild Heart healthy diet Good water intake  Goal: Maintain Healthy Lifestyle  Depression Screen PHQ 2/9 Scores 10/12/2019 08/18/2019 07/27/2019 07/12/2019 05/17/2019 10/06/2018 05/15/2017  PHQ - 2 Score 0 0 1 0 0 0 0  PHQ- 9 Score - - 4 - - - -    Fall Risk Fall Risk  10/12/2019 08/18/2019 07/23/2019 07/12/2019 05/17/2019  Falls in the past year? 0 0 0 0 0  Number falls in past yr: 0 0 - 0 0  Injury with Fall? - 0 - 0 -  Follow up Falls evaluation completed Falls evaluation completed - Falls evaluation completed Falls evaluation completed   Handrails in use when climbing stairs? Yes Home free of loose throw rugs in walkways, pet beds, electrical cords, etc? Yes  Adequate lighting in your home to reduce risk of falls? Yes   ASSISTIVE DEVICES UTILIZED TO PREVENT FALLS: Use of a cane, walker or w/c? No   TIMED UP AND GO: Was the test performed? No . Virtual visit.   Cognitive Function: Patient is alert and oriented x3.  Denies difficulty with focusing, making decisions, memory loss.  Enjoys reading and brain challenging game activities for mind stimulation.      6CIT Screen 10/06/2018 07/04/2017  What Year? 0 points 0 points  What month? 0 points 0 points  What time? 0 points 0 points  Count back from 20 0 points 0 points  Months in reverse 0 points 0 points  Repeat phrase 0  points 0 points  Total Score 0 0    Immunizations Immunization History  Administered Date(s) Administered  . Influenza, High Dose Seasonal PF 11/01/2015, 10/20/2017, 09/28/2019  . Influenza,inj,Quad PF,6+ Mos 10/08/2016  . Influenza-Unspecified 10/01/2018  . PFIZER SARS-COV-2 Vaccination 02/04/2019, 02/25/2019, 10/12/2019    Health Maintenance Health Maintenance  Topic Date Due  . FOOT EXAM  05/28/2019  . PNA vac Low Risk Adult (2 of 2 - PPSV23) 10/11/2020 (Originally 01/15/2016)  . HEMOGLOBIN A1C  01/06/2020  . OPHTHALMOLOGY EXAM  09/09/2020  . TETANUS/TDAP  01/15/2023  . INFLUENZA VACCINE  Completed  . COVID-19 Vaccine  Completed  . Hepatitis C Screening  Completed   Dental Screening: Recommended annual dental exams for proper oral hygiene. Visit every 6 months.  Pneumococcal vaccine- plans to receive later in the season.   Foot exam- denies changes. Followed by pcp.    Community Resource Referral / Chronic Care Management: CRR required this visit?  No   CCM required this visit?  No      Plan:   Keep all routine maintenance appointments.   Follow up CCM 10/26/19 @ 3:15  Follow up 12/20/19 @ 9:30  I have personally reviewed and noted the following in the patient's chart:   . Medical and social history . Use of alcohol, tobacco or illicit drugs  . Current medications and supplements . Functional ability and status . Nutritional status . Physical activity . Advanced directives . List of other physicians . Hospitalizations, surgeries, and ER visits in previous 12 months . Vitals . Screenings to include cognitive, depression, and falls . Referrals and appointments  In addition, I have reviewed and discussed with patient certain preventive protocols, quality metrics, and best practice recommendations. A written personalized care plan for preventive services as well as general preventive health recommendations were provided to patient via mychart.      Varney Biles, LPN   06/20/3708  Agree with plan. [name], NP

## 2019-10-12 NOTE — Patient Instructions (Addendum)
[name] , Thank you for taking time to come for your Medicare Wellness Visit. I appreciate your ongoing commitment to your health goals. Please review the following plan we discussed and let me know if I can assist you in the future.   These are the goals we discussed: Goals      Patient Stated   .  "I want to work on my blood sugars" (pt-stated)      [place] (see longtitudinal plan of care for additional care plan information)  Current Barriers:  . Social, community, and financial barriers:  o Reports he had an EKG at [facility] (in a post-COVID research study) . Diabetes: at goal; most recent Q6S 3.4%; complicated by CAD, BPH, recent hx NSTEMI . Current antihyperglycemic regimen: Ozempic 1 mg weekly, glipizide 10 mg QAM; does report some constipation o APPROVED for Ozempic assistance through [employer] through 01/14/20 o Has been unable to tolerate metformin, IR and ER, d/t diarrhea.  o Did not tolerate Jardiance . Denies hypoglycemia  . Current glucose readings:  o Checks in the mornings, after coffee usually - reports weekly averages 140-150s o After meals/middays 120-130s . Cardiovascular risk reduction; [facility], appointment tomorrow w/ Ignacia Bayley, NP; does report that he had a day where BP ranged 87/63 to 133/79, HR range 49 to 83, however he thinks he took 2 HCTZ 25 mg instead of 1.  o Current hypertensive regimen: HCTZ 25 mg QAM, lisinopril 10 mg QAM o Current hyperlipidemia regimen: rosuvastatin 10 mg daily, ezetimibe 10 mg daily - self-decreased from rosuvastatin 40 mg back to 10 mg, as he was worried his constipation and leg weakness was related to increasing the dose at time of hospitalization. However, today he notes no change in symptoms with decreasing rosuvastatin dose o Current antiplatelet therapy: ASA 81 mg daily, clopidogrel 75 mg daily (DAPT x 12 months then clopidogrel monotherapy) . Constipation: metamucil QPM, fiber bran flakes and V8 juice,  notes this is helping manage well  Pharmacist Clinical Goal(s):  [name] Over the next 90 days, patient will work with PharmD and primary care provider to address optimized medication management  Interventions: . Comprehensive medication review performed, medication list updated in electronic medical record . Inter-disciplinary care team collaboration (see longitudinal plan of care) . Continue Ozempic 1 mg weekly and glipizide 10 mg daily. Encouraged to continue to monitor for hypoglycemia. . Discussed importance of tight lipid control, especially post NSTEMI. Encouraged to discuss increasing rosuvastatin to 20 mg daily w/ cardiology tomorrow.   Patient Self Care Activities:  . Patient will check blood glucose BID , document, and provide at future appointments . Patient will take medications as prescribed . Patient will report any questions or concerns to provider   Please see past updates related to this goal by clicking on the "Past Updates" button in the selected goal         This is a list of the screening recommended for you and due dates:  Health Maintenance  Topic Date Due  . Complete foot exam   05/28/2019  . Pneumonia vaccines (2 of 2 - PPSV23) 10/11/2020*  . Hemoglobin A1C  01/06/2020  . Eye exam for diabetics  09/09/2020  . Tetanus Vaccine  01/15/2023  . Flu Shot  Completed  . COVID-19 Vaccine  Completed  .  Hepatitis C: One time screening is recommended by Center for Disease Control  (CDC) for  adults born from [birth year] through 1965.   Completed  *Topic  was postponed. The date shown is not the original due date.    Immunizations Immunization History  Administered Date(s) Administered  . Influenza, High Dose Seasonal PF 11/01/2015, 10/20/2017  . Influenza,inj,Quad PF,6+ Mos 10/08/2016  . Influenza-Unspecified 10/01/2018  . PFIZER SARS-COV-2 Vaccination 02/04/2019, 02/25/2019, 10/12/2019   Keep all routine maintenance appointments.   Follow up CCM 10/26/19 @  3:15  Follow up 12/20/19 @ 9:30  Advanced directives: End of life planning; Advance aging; Advanced directives discussed.  Copy of current HCPOA/Living Will requested.    Conditions/risks identified: none new.   Follow up in one year for your annual wellness visit.   Preventive Care 12 Years and Older, Male Preventive care refers to lifestyle choices and visits with your health care provider that can promote health and wellness. What does preventive care include?  A yearly physical exam. This is also called an annual well check.  Dental exams once or twice a year.  Routine eye exams. Ask your health care provider how often you should have your eyes checked.  Personal lifestyle choices, including:  Daily care of your teeth and gums.  Regular physical activity.  Eating a healthy diet.  Avoiding tobacco and drug use.  Limiting alcohol use.  Practicing safe sex.  Taking low doses of aspirin every day.  Taking vitamin and mineral supplements as recommended by your health care provider. What happens during an annual well check? The services and screenings done by your health care provider during your annual well check will depend on your age, overall health, lifestyle risk factors, and family history of disease. Counseling  Your health care provider may ask you questions about your:  Alcohol use.  Tobacco use.  Drug use.  Emotional well-being.  Home and relationship well-being.  Sexual activity.  Eating habits.  History of falls.  Memory and ability to understand (cognition).  Work and work Statistician. Screening  You may have the following tests or measurements:  Height, weight, and BMI.  Blood pressure.  Lipid and cholesterol levels. These may be checked every 5 years, or more frequently if you are over 31 years old.  Skin check.  Lung cancer screening. You may have this screening every year starting at age 30 if you have a 30-pack-year history of  smoking and currently smoke or have quit within the past 15 years.  Fecal occult blood test (FOBT) of the stool. You may have this test every year starting at age 31.  Flexible sigmoidoscopy or colonoscopy. You may have a sigmoidoscopy every 5 years or a colonoscopy every 10 years starting at age 27.  Prostate cancer screening. Recommendations will vary depending on your family history and other risks.  Hepatitis C blood test.  Hepatitis B blood test.  Sexually transmitted disease (STD) testing.  Diabetes screening. This is done by checking your blood sugar (glucose) after you have not eaten for a while (fasting). You may have this done every 1-3 years.  Abdominal aortic aneurysm (AAA) screening. You may need this if you are a current or former smoker.  Osteoporosis. You may be screened starting at age 73 if you are at high risk. Talk with your health care provider about your test results, treatment options, and if necessary, the need for more tests. Vaccines  Your health care provider may recommend certain vaccines, such as:  Influenza vaccine. This is recommended every year.  Tetanus, diphtheria, and acellular pertussis (Tdap, Td) vaccine. You may need a Td booster every 10 years.  Zoster vaccine.  You may need this after age 30.  Pneumococcal 13-valent conjugate (PCV13) vaccine. One dose is recommended after age 71.  Pneumococcal polysaccharide (PPSV23) vaccine. One dose is recommended after age 35. Talk to your health care provider about which screenings and vaccines you need and how often you need them. This information is not intended to replace advice given to you by your health care provider. Make sure you discuss any questions you have with your health care provider. Document Released: 01/27/2015 Document Revised: 09/20/2015 Document Reviewed: 11/01/2014 Elsevier Interactive Patient Education  2017 [place] Prevention in the Home Falls can cause injuries.  They can happen to people of all ages. There are many things you can do to make your home safe and to help prevent falls. What can I do on the outside of my home?  Regularly fix the edges of walkways and driveways and fix any cracks.  Remove anything that might make you trip as you walk through a door, such as a raised step or threshold.  Trim any bushes or trees on the path to your home.  Use bright outdoor lighting.  Clear any walking paths of anything that might make someone trip, such as rocks or tools.  Regularly check to see if handrails are loose or broken. Make sure that both sides of any steps have handrails.  Any raised decks and porches should have guardrails on the edges.  Have any leaves, snow, or ice cleared regularly.  Use sand or salt on walking paths during winter.  Clean up any spills in your garage right away. This includes oil or grease spills. What can I do in the bathroom?  Use night lights.  Install grab bars by the toilet and in the tub and shower. Do not use towel bars as grab bars.  Use non-skid mats or decals in the tub or shower.  If you need to sit down in the shower, use a plastic, non-slip stool.  Keep the floor dry. Clean up any water that spills on the floor as soon as it happens.  Remove soap buildup in the tub or shower regularly.  Attach bath mats securely with double-sided non-slip rug tape.  Do not have throw rugs and other things on the floor that can make you trip. What can I do in the bedroom?  Use night lights.  Make sure that you have a light by your bed that is easy to reach.  Do not use any sheets or blankets that are too big for your bed. They should not hang down onto the floor.  Have a firm chair that has side arms. You can use this for support while you get dressed.  Do not have throw rugs and other things on the floor that can make you trip. What can I do in the kitchen?  Clean up any spills right away.  Avoid  walking on wet floors.  Keep items that you use a lot in easy-to-reach places.  If you need to reach something above you, use a strong step stool that has a grab bar.  Keep electrical cords out of the way.  Do not use floor polish or wax that makes floors slippery. If you must use wax, use non-skid floor wax.  Do not have throw rugs and other things on the floor that can make you trip. What can I do with my stairs?  Do not leave any items on the stairs.  Make sure that there are handrails on  both sides of the stairs and use them. Fix handrails that are broken or loose. Make sure that handrails are as long as the stairways.  Check any carpeting to make sure that it is firmly attached to the stairs. Fix any carpet that is loose or worn.  Avoid having throw rugs at the top or bottom of the stairs. If you do have throw rugs, attach them to the floor with carpet tape.  Make sure that you have a light switch at the top of the stairs and the bottom of the stairs. If you do not have them, ask someone to add them for you. What else can I do to help prevent falls?  Wear shoes that:  Do not have high heels.  Have rubber bottoms.  Are comfortable and fit you well.  Are closed at the toe. Do not wear sandals.  If you use a stepladder:  Make sure that it is fully opened. Do not climb a closed stepladder.  Make sure that both sides of the stepladder are locked into place.  Ask someone to hold it for you, if possible.  Clearly mark and make sure that you can see:  Any grab bars or handrails.  First and last steps.  Where the edge of each step is.  Use tools that help you move around (mobility aids) if they are needed. These include:  Canes.  Walkers.  Scooters.  Crutches.  Turn on the lights when you go into a dark area. Replace any light bulbs as soon as they burn out.  Set up your furniture so you have a clear path. Avoid moving your furniture around.  If any of your  floors are uneven, fix them.  If there are any pets around you, be aware of where they are.  Review your medicines with your doctor. Some medicines can make you feel dizzy. This can increase your chance of falling. Ask your doctor what other things that you can do to help prevent falls. This information is not intended to replace advice given to you by your health care provider. Make sure you discuss any questions you have with your health care provider. Document Released: 10/27/2008 Document Revised: 06/08/2015 Document Reviewed: 02/04/2014 Elsevier Interactive Patient Education  2017 [employer].

## 2019-10-13 ENCOUNTER — Telehealth: Payer: Self-pay | Admitting: Cardiology

## 2019-10-13 ENCOUNTER — Ambulatory Visit: Payer: Medicare Other | Admitting: Cardiology

## 2019-10-13 ENCOUNTER — Encounter: Payer: Self-pay | Admitting: Cardiology

## 2019-10-13 ENCOUNTER — Other Ambulatory Visit: Payer: Self-pay

## 2019-10-13 VITALS — BP 120/82 | HR 71 | Ht 73.0 in | Wt 203.0 lb

## 2019-10-13 DIAGNOSIS — I493 Ventricular premature depolarization: Secondary | ICD-10-CM

## 2019-10-13 DIAGNOSIS — I5022 Chronic systolic (congestive) heart failure: Secondary | ICD-10-CM

## 2019-10-13 DIAGNOSIS — I255 Ischemic cardiomyopathy: Secondary | ICD-10-CM | POA: Diagnosis not present

## 2019-10-13 DIAGNOSIS — I4729 Other ventricular tachycardia: Secondary | ICD-10-CM

## 2019-10-13 DIAGNOSIS — I472 Ventricular tachycardia: Secondary | ICD-10-CM

## 2019-10-13 MED ORDER — ROSUVASTATIN CALCIUM 20 MG PO TABS: 20.0000 mg | ORAL_TABLET | Freq: Every day | ORAL | 3 refills | Status: DC

## 2019-10-13 NOTE — Patient Instructions (Addendum)
Medication Instructions:  Your physician recommends that you continue on your current medications as directed. Please refer to the Current Medication list given to you today.  *If you need a refill on your cardiac medications before your next appointment, please call your pharmacy*  Lab Work: None ordered.  If you have labs (blood work) drawn today and your tests are completely normal, you will receive your results only by: [name] MyChart Message (if you have MyChart) OR . A paper copy in the mail If you have any lab test that is abnormal or we need to change your treatment, we will call you to review the results.  Testing/Procedures: Your physician has requested that you have a cardiac MRI. Cardiac MRI uses a computer to create images of your heart as its beating, producing both still and moving pictures of your heart and major blood vessels.  Follow-Up: At [facility], you and your health needs are our priority.  As part of our continuing mission to provide you with exceptional heart care, we have created designated Provider Care Teams.  These Care Teams include your primary Cardiologist (physician) and Advanced Practice Providers (APPs -  Physician Assistants and Nurse Practitioners) who all work together to provide you with the care you need, when you need it.  Your next appointment:   Your physician wants you to follow-up in: 6-8 weeks with Dr. Quentin Ore at the [facility] office.

## 2019-10-13 NOTE — Telephone Encounter (Signed)
Left message for patient to call and discuss preferred weekdays and times for scheduling the Cardiac MRI ordered by Dr. Quentin Ore.

## 2019-10-14 ENCOUNTER — Encounter: Payer: Self-pay | Admitting: Cardiology

## 2019-10-14 ENCOUNTER — Ambulatory Visit: Payer: Medicare Other

## 2019-10-14 NOTE — Telephone Encounter (Signed)
Spoke with patient regarding appointment for Cardiac MRI scheduled Thursday 11/04/19 at 12:00pm at [facility]----arrival time is 11:30 am--1st floor admissions office----will mail information to patient and it is available in My Chart.  He voiced his understanding.

## 2019-10-19 ENCOUNTER — Ambulatory Visit: Payer: Medicare Other

## 2019-10-21 ENCOUNTER — Ambulatory Visit: Payer: Medicare Other

## 2019-10-26 ENCOUNTER — Ambulatory Visit: Payer: Medicare Other

## 2019-10-26 ENCOUNTER — Telehealth: Payer: Self-pay | Admitting: Pharmacist

## 2019-10-26 ENCOUNTER — Ambulatory Visit (INDEPENDENT_AMBULATORY_CARE_PROVIDER_SITE_OTHER): Payer: Medicare Other | Admitting: Pharmacist

## 2019-10-26 DIAGNOSIS — I214 Non-ST elevation (NSTEMI) myocardial infarction: Secondary | ICD-10-CM | POA: Diagnosis not present

## 2019-10-26 DIAGNOSIS — E1165 Type 2 diabetes mellitus with hyperglycemia: Secondary | ICD-10-CM | POA: Diagnosis not present

## 2019-10-26 DIAGNOSIS — I25118 Atherosclerotic heart disease of native coronary artery with other forms of angina pectoris: Secondary | ICD-10-CM

## 2019-10-26 NOTE — Chronic Care Management (AMB) (Signed)
Patient returned call. See CCM note. ?

## 2019-10-26 NOTE — Patient Instructions (Signed)
Visit Information  Goals Addressed              This Visit's Progress     Patient Stated     "I want to work on my blood sugars" (pt-stated)        Lupus (see longtitudinal plan of care for additional care plan information)  Current Barriers:   Social, community, and financial barriers:  o No cost concerns at this time. Asks when the process for patient assistance for 2022 can be started.   Diabetes: at goal; most recent F7P 1.0%; complicated by CAD, BPH, recent hx NSTEMI  Current antihyperglycemic regimen: Ozempic 1 mg weekly, glipizide 10 mg QAM o APPROVED for Ozempic assistance through [employer] through 12/14/19 o Has been unable to tolerate metformin, IR and ER, d/t diarrhea.  o Did not tolerate Jardiance d/t urinary symptoms  Denies hypoglycemia or hypoglycemic symptoms  Current glucose readings:  o Fasting: 140-150s o After meals/middays 140-150s  Cardiovascular risk reduction; [facility]/ [facility]- primary cardiology Ignacia Bayley, EP Dr. Quentin Ore. Cardiac MRI upcoming for evaluation of NSVT and ventricular ectopy.  o Current hypertensive regimen: HCTZ 25 mg QAM, lisinopril 10 mg QAM; carvedilol 6.25 mg BID - Reports home BP 120/78s, HR most recently 58, but occasionally up to the 80s. Reports that prior to carvedilol, HR would spike to the >100s.  o Current hyperlipidemia regimen: rosuvastatin 20 mg daily, ezetimibe 10 mg daily - Rosuvastatin increased to 40 mg after NSTEMI, but patient had constipation and attributed to rosuvastatin dose increased, so self-reduced back to 10 mg daily. Increased to 20 mg daily by Sharolyn Douglas at last visit. Lipid panel/CMP upcoming.  o Current antiplatelet therapy: 81 mg daily, clopidogrel 75 mg daily (DAPT x 12 months then clopidogrel monotherapy)  Constipation: metamucil QPM, fiber bran flakes and V8 juice; notes that if he skips on this fiber supplementation, he will have constipation  Pharmacist Clinical Goal(s):   Over the  next 90 days, patient will work with PharmD and primary care provider to address optimized medication management  Interventions:  Comprehensive medication review performed, medication list updated in electronic medical record  Inter-disciplinary care team collaboration (see longitudinal plan of care)  Continue current regimen. Though fasting sugars at not at goal, patient's fastings have historically run higher than goal. Did not tolerate metformin or SGLT2, would not add pioglitazone d/t HF, DPP4 d/t concurrent GLP1. Will avoid basal insulin at this time, as goal A1c ~7-7.5% likely appropriate given age, comorbidities.   Reviewed recent cardiology visits. Patient has a good understanding of next steps  Discussed reapplication process for [employer] assistance for [employer]. Will start the process of reapplication at our next appointment in December.   Brief discussion of beta blocker mechanism of action.   Patient Self Care Activities:   Patient will check blood glucose BID, document, and provide at future appointments  Patient will check blood pressure daily, document, and provide at future appointments  Patient will take medications as prescribed  Patient will report any questions or concerns to provider   Please see past updates related to this goal by clicking on the "Past Updates" button in the selected goal         The patient verbalized understanding of instructions provided today and declined a print copy of patient instruction materials.   Plan:  - Scheduled f/u call in ~10 weeks  [name], PharmD, [place], [place] Pharmacist [place] [phone]

## 2019-10-26 NOTE — Chronic Care Management (AMB) (Signed)
°  Chronic Care Management   Note  10/26/2019 Name: [name] [name] MRN: [id number] DOB: 1940-05-22   Attempted to contact patient for scheduled appointment for medication management support. Left HIPAA compliant message for patient to return my call at their convenience.    Plan: - If I do not hear back from the patient by end of business today, will collaborate with Care Guide to outreach to schedule follow up with me   [name], PharmD, [place], [place] Pharmacist [place] [phone]

## 2019-10-26 NOTE — Chronic Care Management (AMB) (Signed)
Chronic Care Management   Follow Up Note   10/26/2019 Name: [name] [name] MRN: [id number] DOB: Jun 14, 1940  Referred by: [name] Hawthorne, [name] Reason for referral : Chronic Care Management (Medication Management)   [name] [name] is a 79 y.o. year old male who is a primary care patient of [name] Hawthorne, [name]. The CCM team was consulted for assistance with chronic disease management and care coordination needs.    Contacted patient for medication management review.   Review of patient status, including review of consultants reports, relevant laboratory and other test results, and collaboration with appropriate care team members and the patient's provider was performed as part of comprehensive patient evaluation and provision of chronic care management services.    SDOH (Social Determinants of Health) assessments performed: Yes See Care Plan activities for detailed interventions related to SDOH)  SDOH Interventions     Most Recent Value  SDOH Interventions  Financial Strain Interventions Other (Comment)  [manufacturer assistance]       Outpatient Encounter Medications as of 10/26/2019  Medication Sig  . carvedilol (COREG) 6.25 MG tablet Take 1 tablet (6.25 mg total) by mouth 2 (two) times daily.  [name] glipiZIDE (GLUCOTROL) 10 MG tablet Take 10 mg by mouth daily before breakfast.  . hydrochlorothiazide (HYDRODIURIL) 25 MG tablet TAKE 1 TABLET(25 MG) BY MOUTH DAILY  . lisinopril (ZESTRIL) 10 MG tablet TAKE 1 TABLET BY MOUTH DAILY  . Omega-3 Fatty Acids (FISH OIL PO) Take 1 capsule by mouth daily at 12 noon.   Glory Rosebush VERIO test strip 2 (two) times daily.  [name] OZEMPIC, 1 MG/DOSE, 4 MG/3ML SOPN ADMINISTER 1 MG UNDER THE SKIN 1 TIME A WEEK  . acetaminophen (TYLENOL) 650 MG CR tablet Take 650 mg by mouth 2 (two) times daily as needed (for pain).   [name] aspirin EC 81 MG tablet Take 81 mg by mouth daily.   . clopidogrel (PLAVIX) 75 MG tablet Take 1 tablet (75 mg total) by mouth  daily with breakfast.  . ezetimibe (ZETIA) 10 MG tablet TAKE 1 TABLET(10 MG) BY MOUTH DAILY  . fluticasone (FLONASE) 50 MCG/ACT nasal spray Place 2 sprays into both nostrils daily.  . hydrocortisone cream 1 % Apply 1 application topically See admin instructions. Apply to affected area/rash every 3 days  . ipratropium (ATROVENT) 0.06 % nasal spray Place 2 sprays into both nostrils 3 (three) times daily.  . Multiple Vitamin (MULTIVITAMIN) capsule Take 1 capsule by mouth daily with lunch.   . nitroGLYCERIN (NITROSTAT) 0.4 MG SL tablet Place 1 tablet (0.4 mg total) under the tongue every 5 (five) minutes x 3 doses as needed for chest pain.  [name] oxyCODONE (OXY IR/ROXICODONE) 5 MG immediate release tablet oxycodone 5 mg tablet  . pantoprazole (PROTONIX) 40 MG tablet Take 1 tablet (40 mg total) by mouth daily before breakfast.  . Polyvinyl Alcohol-Povidone (MURINE TEARS FOR DRY EYES OP) Place 1-2 drops into both eyes as needed (for dry eyes).   . psyllium (METAMUCIL) 58.6 % powder Take 1 packet by mouth at bedtime as needed (if no roughage is consumed that day- Mix and drink).   . rosuvastatin (CRESTOR) 20 MG tablet Take 1 tablet (20 mg total) by mouth daily.  . sodium chloride (OCEAN) 0.65 % SOLN nasal spray Place 1 spray into both nostrils as needed for congestion.  . tamsulosin (FLOMAX) 0.4 MG CAPS capsule TAKE 1 CAPSULE(0.4 MG) BY MOUTH DAILY  . traMADol (ULTRAM) 50 MG tablet TAKE 1 TABLET BY MOUTH EVERY 12  HOURS AS NEEDED   No facility-administered encounter medications on file as of 10/26/2019.     Objective:   Goals Addressed              This Visit's Progress     Patient Stated   .  "I want to work on my blood sugars" (pt-stated)        [place] (see longtitudinal plan of care for additional care plan information)  Current Barriers:  . Social, community, and financial barriers:  o No cost concerns at this time. Asks when the process for patient assistance for 2022 can be  started.  . Diabetes: at goal; most recent F8H 8.2%; complicated by CAD, BPH, recent hx NSTEMI . Current antihyperglycemic regimen: Ozempic 1 mg weekly, glipizide 10 mg QAM o APPROVED for Ozempic assistance through [employer] through 12/14/19 o Has been unable to tolerate metformin, IR and ER, d/t diarrhea.  o Did not tolerate Jardiance d/t urinary symptoms . Denies hypoglycemia or hypoglycemic symptoms . Current glucose readings:  o Fasting: 140-150s o After meals/middays 140-150s . Cardiovascular risk reduction; [facility]- primary cardiology [name] [name], EP Dr. Quentin Ore. Cardiac MRI upcoming for evaluation of NSVT and ventricular ectopy.  o Current hypertensive regimen: HCTZ 25 mg QAM, lisinopril 10 mg QAM; carvedilol 6.25 mg BID - Reports home BP 120/78s, HR most recently 58, but occasionally up to the 80s. Reports that prior to carvedilol, HR would spike to the >100s.  o Current hyperlipidemia regimen: rosuvastatin 20 mg daily, ezetimibe 10 mg daily - Rosuvastatin increased to 40 mg after NSTEMI, but patient had constipation and attributed to rosuvastatin dose increased, so self-reduced back to 10 mg daily. Increased to 20 mg daily by Sharolyn Douglas at last visit. Lipid panel/CMP upcoming.  o Current antiplatelet therapy: 81 mg daily, clopidogrel 75 mg daily (DAPT x 12 months then clopidogrel monotherapy) . Constipation: metamucil QPM, fiber bran flakes and V8 juice; notes that if he skips on this fiber supplementation, he will have constipation  Pharmacist Clinical Goal(s):  [name] Over the next 90 days, patient will work with PharmD and primary care provider to address optimized medication management  Interventions: . Comprehensive medication review performed, medication list updated in electronic medical record . Inter-disciplinary care team collaboration (see longitudinal plan of care) . Continue current regimen. Though fasting sugars at not at goal, patient's fastings have historically  run higher than goal. Did not tolerate metformin or SGLT2, would not add pioglitazone d/t HF, DPP4 d/t concurrent GLP1. Will avoid basal insulin at this time, as goal A1c ~7-7.5% likely appropriate given age, comorbidities.  . Reviewed recent cardiology visits. Patient has a good understanding of next steps . Discussed reapplication process for [employer] assistance for [employer]. Will start the process of reapplication at our next appointment in December.  . Brief discussion of beta blocker mechanism of action.   Patient Self Care Activities:  . Patient will check blood glucose BID, document, and provide at future appointments . Patient will check blood pressure daily, document, and provide at future appointments . Patient will take medications as prescribed . Patient will report any questions or concerns to provider   Please see past updates related to this goal by clicking on the "Past Updates" button in the selected goal          Plan:  - Scheduled f/u call in ~10 weeks  [name], PharmD, [place], [place] Pharmacist Bureau [place] [phone]

## 2019-10-27 NOTE — Progress Notes (Signed)
I have collaborated with the care management provider regarding care management and care coordination activities outlined in this encounter and have reviewed this encounter including documentation in the note and care plan. I am certifying that I agree with the content of this note and encounter as primary care provider.   [name], NP

## 2019-10-28 ENCOUNTER — Ambulatory Visit: Payer: Medicare Other

## 2019-11-02 ENCOUNTER — Ambulatory Visit: Payer: Medicare Other

## 2019-11-03 ENCOUNTER — Telehealth (HOSPITAL_COMMUNITY): Payer: Self-pay | Admitting: *Deleted

## 2019-11-03 NOTE — Telephone Encounter (Signed)
Reaching out to patient to offer assistance regarding upcoming cardiac imaging study; pt's wife verbalizes understanding of appt date/time, parking situation and where to check in, and verified current allergies; name and call back number provided for further questions should they arise  Merle Tai RN Navigator Cardiac Imaging [facility] [phone] office [phone] cell  Pt will not drive if taking his anti anxiety medication.  

## 2019-11-04 ENCOUNTER — Ambulatory Visit (HOSPITAL_COMMUNITY)
Admission: RE | Admit: 2019-11-04 | Discharge: 2019-11-04 | Disposition: A | Discharge status: Home / Self Care | Payer: Medicare Other | Source: Ambulatory Visit | Attending: Cardiology | Admitting: Cardiology

## 2019-11-04 ENCOUNTER — Ambulatory Visit: Payer: Medicare Other

## 2019-11-04 ENCOUNTER — Other Ambulatory Visit: Payer: Self-pay

## 2019-11-04 DIAGNOSIS — I255 Ischemic cardiomyopathy: Secondary | ICD-10-CM | POA: Diagnosis not present

## 2019-11-04 DIAGNOSIS — I472 Ventricular tachycardia: Secondary | ICD-10-CM

## 2019-11-04 DIAGNOSIS — I4729 Other ventricular tachycardia: Secondary | ICD-10-CM

## 2019-11-04 MED ORDER — GADOBUTROL 1 MMOL/ML IV SOLN
10.0000 mL | Freq: Once | INTRAVENOUS | Status: AC | PRN
  Administered 2019-11-04: 10 mL via INTRAVENOUS

## 2019-11-06 DIAGNOSIS — I472 Ventricular tachycardia: Secondary | ICD-10-CM | POA: Diagnosis not present

## 2019-11-06 DIAGNOSIS — I255 Ischemic cardiomyopathy: Secondary | ICD-10-CM | POA: Diagnosis not present

## 2019-11-09 ENCOUNTER — Encounter: Payer: Self-pay | Admitting: Cardiology

## 2019-11-09 ENCOUNTER — Ambulatory Visit: Payer: Medicare Other | Admitting: Cardiology

## 2019-11-09 ENCOUNTER — Other Ambulatory Visit: Payer: Self-pay

## 2019-11-09 ENCOUNTER — Ambulatory Visit: Payer: Medicare Other

## 2019-11-09 VITALS — BP 126/78 | HR 65 | Ht 72.5 in | Wt 203.2 lb

## 2019-11-09 DIAGNOSIS — I255 Ischemic cardiomyopathy: Secondary | ICD-10-CM | POA: Diagnosis not present

## 2019-11-09 DIAGNOSIS — I513 Intracardiac thrombosis, not elsewhere classified: Secondary | ICD-10-CM | POA: Diagnosis not present

## 2019-11-09 DIAGNOSIS — I493 Ventricular premature depolarization: Secondary | ICD-10-CM | POA: Diagnosis not present

## 2019-11-09 DIAGNOSIS — I472 Ventricular tachycardia: Secondary | ICD-10-CM

## 2019-11-09 DIAGNOSIS — I4729 Other ventricular tachycardia: Secondary | ICD-10-CM

## 2019-11-09 DIAGNOSIS — I5022 Chronic systolic (congestive) heart failure: Secondary | ICD-10-CM

## 2019-11-09 MED ORDER — APIXABAN 5 MG PO TABS: 5.0000 mg | ORAL_TABLET | Freq: Two times a day (BID) | ORAL | 3 refills | Status: DC

## 2019-11-09 MED ORDER — CARVEDILOL 12.5 MG PO TABS: 12.5000 mg | ORAL_TABLET | Freq: Two times a day (BID) | ORAL | 3 refills | Status: DC

## 2019-11-09 NOTE — Progress Notes (Signed)
Electrophysiology Office Follow up Visit Note:    Date:  11/09/2019   ID:  [name] [name], DOB 10/27/1940, MRN [id number]  PCP:  [name], [facility] Cardiologist:  Ida Rogue, MD  [facility] Electrophysiologist:  None    Interval History:    [name] [name] is a 79 y.o. male who presents for a follow up visit. They were last seen in clinic October 13, 2019 for ventricular tachycardia.  I had previously seen him after hospitalization for NSTEMI.  He was started on optimal medical therapy including aspirin, Plavix and carvedilol.  He had been stented and I ordered an MRI to reassess his left ventricular function.  He also had PVCs with a 10% burden and nonsustained VT on a Zio patch monitor.  Since our appointment he received his cardiac MRI.  Notable findings include a left ventricular ejection fraction of 35%, LGE in the basal and mid inferior and inferior lateral walls, dyskinesis of the basal and inferior inferolateral walls and a left ventricular mural thrombus located on the inferolateral wall.  The patient tells me he is doing well overall.  He continues to walk his dog frequently without significant limitation in his exercise capacity.  He is with his wife in clinic today.     Past Medical History:  Diagnosis Date  . Aortic insufficiency    a. noted on TTE 2015; b. 06/2019 Echo: AI not visualized.  . Arthritis   . CAD (coronary artery disease)    a. remote PCI in 1991 and 2005; b. MV 3/15: old inferior MI, no ischemia, LVEF 50%, slight inferior wall hypokniesis; c. 06/2019 NSTEMI/PCI: LM nl, LAD 80p/m (Atherectomy & 4.5x18 Resolute Onyx DES), 14m/d, D1 75 (PTCA), RI patent stent, LCX nl, RCA 100p, RPAV fills via L->R collats from LCX.  [name] Chicken pox   . Colon polyps    4 pre-cancerous   . Diverticulitis   . DM type 2 (diabetes mellitus, type 2) ([place])   . Family history of adverse reaction to anesthesia   . GERD (gastroesophageal reflux disease)     . Heart murmur   . History of kidney stones   . HOH (hard of hearing)   . Hyperlipidemia   . Hypertension   . Ischemic cardiomyopathy    a. TTE 2015: EF  50-55%, mild global HK; b. 06/2019 Echo: EF 45-50%, Gr1 DD, basal inf AK. Triv MR.  . Kidney stones   . Melanoma ([place]) 1980   Resected from his back  . Mitral regurgitation    a. noted on TTE 2015  . Myocardial infarction [facility])     Past Surgical History:  Procedure Laterality Date  . CHOLECYSTECTOMY  2012  . COLONOSCOPY     in 2003 with polyp removed and leak anastomosis had to have open abdominal surgery   . CORONARY ANGIOPLASTY WITH STENT PLACEMENT  1991 & 2005  . CORONARY ATHERECTOMY N/A 07/08/2019   Procedure: CORONARY ATHERECTOMY;  Surgeon: Jettie Booze, MD;  Location: [place] [facility];  Service: Cardiovascular;  Laterality: N/A;  . CORONARY BALLOON ANGIOPLASTY N/A 07/08/2019   Procedure: CORONARY BALLOON ANGIOPLASTY;  Surgeon: Jettie Booze, MD;  Location: [place] [facility];  Service: Cardiovascular;  Laterality: N/A;  diagonal   . CORONARY STENT INTERVENTION N/A 07/08/2019   Procedure: CORONARY STENT INTERVENTION;  Surgeon: Jettie Booze, MD;  Location: [place] [facility];  Service: Cardiovascular;  Laterality: N/A;  lad  . CYSTOSCOPY/URETEROSCOPY/HOLMIUM LASER/STENT PLACEMENT Right 01/02/2018  Procedure: CYSTOSCOPY/URETEROSCOPY/HOLMIUM LASER/STENT PLACEMENT;  Surgeon: [name], MD;  Location: [facility];  Service: Urology;  Laterality: Right;  . ESOPHAGOGASTRODUODENOSCOPY (EGD) WITH PROPOFOL N/A 11/19/2016   Procedure: ESOPHAGOGASTRODUODENOSCOPY (EGD) WITH PROPOFOL;  Surgeon: [name], MD;  Location: [facility];  Service: Endoscopy;  Laterality: N/A;  . ESOPHAGOGASTRODUODENOSCOPY (EGD) WITH PROPOFOL N/A 02/02/2019   Procedure: ESOPHAGOGASTRODUODENOSCOPY (EGD) WITH PROPOFOL;  Surgeon: [name], MD;  Location: [facility] ENDOSCOPY;  Service: Endoscopy;  Laterality: N/A;  . INTRAVASCULAR  PRESSURE WIRE/FFR STUDY N/A 07/07/2019   Procedure: INTRAVASCULAR PRESSURE WIRE/FFR STUDY;  Surgeon: Nelva Bush, MD;  Location: [place] [facility];  Service: Cardiovascular;  Laterality: N/A;  . INTRAVASCULAR ULTRASOUND/IVUS N/A 07/08/2019   Procedure: Intravascular Ultrasound/IVUS;  Surgeon: Jettie Booze, MD;  Location: [place] [facility];  Service: Cardiovascular;  Laterality: N/A;  . LEFT HEART CATH N/A 07/08/2019   Procedure: Left Heart Cath;  Surgeon: Jettie Booze, MD;  Location: [place] [facility];  Service: Cardiovascular;  Laterality: N/A;  . LEFT HEART CATH AND CORONARY ANGIOGRAPHY N/A 07/07/2019   Procedure: LEFT HEART CATH AND CORONARY ANGIOGRAPHY;  Surgeon: Nelva Bush, MD;  Location: [place] [facility];  Service: Cardiovascular;  Laterality: N/A;  . LITHOTRIPSY  2015  . MELANOMA EXCISION  1980   malignant  . NERVE SURGERY  2015   ulna nerve  . TONSILLECTOMY  1945  . WISDOM TOOTH EXTRACTION      Current Medications: Current Meds  Medication Sig  . acetaminophen (TYLENOL) 650 MG CR tablet Take 650 mg by mouth 2 (two) times daily as needed (for pain).   [name] aspirin EC 81 MG tablet Take 81 mg by mouth daily.   . clopidogrel (PLAVIX) 75 MG tablet Take 1 tablet (75 mg total) by mouth daily with breakfast.  . ezetimibe (ZETIA) 10 MG tablet TAKE 1 TABLET(10 MG) BY MOUTH DAILY  . fluticasone (FLONASE) 50 MCG/ACT nasal spray Place 2 sprays into both nostrils daily.  [name] glipiZIDE (GLUCOTROL) 10 MG tablet Take 10 mg by mouth daily before breakfast.  . hydrochlorothiazide (HYDRODIURIL) 25 MG tablet TAKE 1 TABLET(25 MG) BY MOUTH DAILY  . hydrocortisone cream 1 % Apply 1 application topically See admin instructions. Apply to affected area/rash every 3 days  . ipratropium (ATROVENT) 0.06 % nasal spray Place 2 sprays into both nostrils 3 (three) times daily.  [name] lisinopril (ZESTRIL) 10 MG tablet TAKE 1 TABLET BY MOUTH DAILY  . Multiple Vitamin (MULTIVITAMIN) capsule  Take 1 capsule by mouth daily with lunch.   . nitroGLYCERIN (NITROSTAT) 0.4 MG SL tablet Place 1 tablet (0.4 mg total) under the tongue every 5 (five) minutes x 3 doses as needed for chest pain.  . Omega-3 Fatty Acids (FISH OIL PO) Take 1 capsule by mouth daily at 12 noon.   Glory Rosebush VERIO test strip 2 (two) times daily.  [name] oxyCODONE (OXY IR/ROXICODONE) 5 MG immediate release tablet Take 5 mg by mouth every 6 (six) hours as needed.   [name] OZEMPIC, 1 MG/DOSE, 4 MG/3ML SOPN ADMINISTER 1 MG UNDER THE SKIN 1 TIME A WEEK  . pantoprazole (PROTONIX) 40 MG tablet Take 1 tablet (40 mg total) by mouth daily before breakfast.  . Polyvinyl Alcohol-Povidone (MURINE TEARS FOR DRY EYES OP) Place 1-2 drops into both eyes as needed (for dry eyes).   . psyllium (METAMUCIL) 58.6 % powder Take 1 packet by mouth at bedtime as needed (if no roughage is consumed that day- Mix and drink).   [name]  rosuvastatin (CRESTOR) 20 MG tablet Take 1 tablet (20 mg total) by mouth daily.  . sodium chloride (OCEAN) 0.65 % SOLN nasal spray Place 1 spray into both nostrils as needed for congestion.  . tamsulosin (FLOMAX) 0.4 MG CAPS capsule TAKE 1 CAPSULE(0.4 MG) BY MOUTH DAILY  . traMADol (ULTRAM) 50 MG tablet TAKE 1 TABLET BY MOUTH EVERY 12 HOURS AS NEEDED  . [DISCONTINUED] carvedilol (COREG) 6.25 MG tablet Take 1 tablet (6.25 mg total) by mouth 2 (two) times daily.     Allergies:   Metformin and related, Azithromycin, Other, Percocet [oxycodone-acetaminophen], and Jardiance [empagliflozin]   Social History   Socioeconomic History  . Marital status: Married    Spouse name: Not on file  . Number of children: Not on file  . Years of education: Not on file  . Highest education level: Not on file  Occupational History  . Not on file  Tobacco Use  . Smoking status: Current Some Day Smoker    Types: Pipe  . Smokeless tobacco: Former Systems developer    Quit date: 10/17/2015  Vaping Use  . Vaping Use: Never used  Substance and Sexual Activity    . Alcohol use: Not Currently    Alcohol/week: 0.0 - 1.0 standard drinks  . Drug use: No  . Sexual activity: Not on file  Other Topics Concern  . Not on file  Social History Narrative   Married    Social Determinants of Health   Financial Resource Strain: Low Risk   . Difficulty of Paying Living Expenses: Not very hard  Food Insecurity:   . Worried About Charity fundraiser in the Last Year: Not on file  . Ran Out of Food in the Last Year: Not on file  Transportation Needs:   . Lack of Transportation (Medical): Not on file  . Lack of Transportation (Non-Medical): Not on file  Physical Activity:   . Days of Exercise per Week: Not on file  . Minutes of Exercise per Session: Not on file  Stress:   . Feeling of Stress : Not on file  Social Connections:   . Frequency of Communication with Friends and Family: Not on file  . Frequency of Social Gatherings with Friends and Family: Not on file  . Attends Religious Services: Not on file  . Active Member of Clubs or Organizations: Not on file  . Attends Archivist Meetings: Not on file  . Marital Status: Not on file     Family History: The patient's family history includes Colon cancer in his father; Diabetes in his father and mother; Heart attack in his mother; Heart disease in his mother; Hyperlipidemia in his mother; Hypertension in his mother; Kidney cancer in his father; Liver cancer in his father; Lung cancer in his father. There is no history of Bladder Cancer or Prostate cancer.  ROS:   Please see the history of present illness.    All other systems reviewed and are negative.  EKGs/Labs/Other Studies Reviewed:    The following studies were reviewed today: cMR  November 04, 2019 cardiac MRI personally reviewed Left ventricle: -Thinning/dyskinesis of basal to mid inferior/inferolateral walls -Mild dilatation -Moderate systolic dysfunction -Subendocardial LGE in basal to mid inferior/inferolateral  walls. >50% transmural -RV insertion site LGE -Mass-like structure ~1cm along the LV mid lateral wall that is hypointense on T1 weighted imaging, hypointense on T2 weighted imaging, no uptake on first pass perfusion, does not enhance on LGE. -LV mural thrombus seen within dyskinetic region of  mid inferolateral wall LV EF: 35% (Normal 56-78%)   EKG:  The ekg ordered today demonstrates sinus rhythm with an inferior infarct pattern.  Recent Labs: 07/07/2019: ALT 41; B Natriuretic Peptide 111.1; Magnesium 1.4 07/09/2019: BUN 10; Creatinine, Ser 0.64; Hemoglobin 14.6; Platelets 173; Potassium 4.4; Sodium 137  Recent Lipid Panel    Component Value Date/Time   CHOL 120 02/10/2019 0903   TRIG 215.0 (H) 02/10/2019 0903   HDL 39.00 (L) 02/10/2019 0903   CHOLHDL 3 02/10/2019 0903   VLDL 43.0 (H) 02/10/2019 0903   LDLCALC 70 03/02/2018 0822   LDLDIRECT 55.0 02/10/2019 0903    Physical Exam:    VS:  BP 126/78   Pulse 65   Ht 6' 0.5" (1.842 m)   Wt 203 lb 3.2 oz (92.2 kg)   SpO2 97%   BMI 27.18 kg/m     Wt Readings from Last 3 Encounters:  11/09/19 203 lb 3.2 oz (92.2 kg)  10/13/19 203 lb (92.1 kg)  10/12/19 199 lb (90.3 kg)     GEN:  Well nourished, well developed in no acute distress HEENT: Normal NECK: No JVD; No carotid bruits LYMPHATICS: No lymphadenopathy CARDIAC: RRR, no murmurs, rubs, gallops RESPIRATORY:  Clear to auscultation without rales, wheezing or rhonchi  ABDOMEN: Soft, non-tender, non-distended MUSCULOSKELETAL:  No edema; No deformity  SKIN: Warm and dry NEUROLOGIC:  Alert and oriented x 3 PSYCHIATRIC:  Normal affect   ASSESSMENT:    1. Chronic systolic heart failure ([place])   2. Ischemic cardiomyopathy   3. LV (left ventricular) mural thrombus   4. PVC's (premature ventricular contractions)   5. NSVT (nonsustained ventricular tachycardia) (H[facility])    PLAN:    In order of problems listed above:  1. Chronic systolic heart failure secondary to severe  coronary artery disease Ejection fraction 35% on recent MRI.  He is a very active man and has a history of ventricular tachycardia in the setting of ischemia and nonsustained VT on Holter.  He is NYHA class II.Current medications include carvedilol and lisinopril.  I would like to increase his carvedilol to 12.5 mg twice daily. Given his decreased ejection fraction, high level of activity, MRI findings suggestive of significant scar burden in the left ventricle and history of ventricular tachycardia I do think he is a reasonable candidate for an ICD.    Before I would implant an ICD I would like to give him at least 6 weeks of anticoagulation for his left ventricular mural thrombus.  I will plan to use Eliquis to treat this and then followed up with an echocardiogram with contrast to assess for resolution.  I will see him back in the office after his echocardiogram to review the results.  We will tentatively book an ICD implant date for the end of December/early January.  2.  Left ventricular mural thrombus Overlying large infarct in the inferior/inferior lateral wall. Start Eliquis 5 mg twice daily. Follow-up echocardiogram in 6 weeks with contrast to assess for resolution  3.  Nonsustained VT/VT His history of VT was in the setting of active ischemia during his most recent infarct in June.  Regardless of whether or not this was during active ischemia I think he is at a significantly increased risk for recurrent ventricular arrhythmias given the large scar on his MRI.  I had a long discussion with the patient and his wife using a shared decision-making process and we will plan on an ICD implant later in the year.  The patient has  an ischemic CM (EF 35%), NYHA Class II CHF, and CAD.  At this time, he meets criteria for ICD implantation for primary prevention of sudden death.  I have had a thorough discussion with the patient reviewing options.  The patient and their family (if available) have had  opportunities to ask questions and have them answered. The patient and I have decided together through a shared decision making process to proceed with ICD implant.   Risks, benefits, alternatives to ICD implantation were discussed in detail with the patient today. The patient understands that the risks include but are not limited to bleeding, infection, pneumothorax, perforation, tamponade, vascular damage, renal failure, MI, stroke, death, inappropriate shocks, and lead dislodgement and wishes to proceed.    Medication Adjustments/Labs and Tests Ordered: Current medicines are reviewed at length with the patient today.  Concerns regarding medicines are outlined above.  Orders Placed This Encounter  Procedures  . EKG 12-Lead  . ECHOCARDIOGRAM COMPLETE   Meds ordered this encounter  Medications  . apixaban (ELIQUIS) 5 MG TABS tablet    Sig: Take 1 tablet (5 mg total) by mouth 2 (two) times daily.    Dispense:  180 tablet    Refill:  3  . carvedilol (COREG) 12.5 MG tablet    Sig: Take 1 tablet (12.5 mg total) by mouth 2 (two) times daily.    Dispense:  180 tablet    Refill:  3     Signed, [name] Mage, MD, [facility]  11/09/2019 5:00 PM    Electrophysiology [place]

## 2019-11-09 NOTE — Patient Instructions (Addendum)
Medication Instructions:  Increase your carvedilol to 12.5 mg  Start Eliquis 5 mg two times a day  *If you need a refill on your cardiac medications before your next appointment, please call your pharmacy*  Lab Work: None ordered.  If you have labs (blood work) drawn today and your tests are completely normal, you will receive your results only by: [name] MyChart Message (if you have MyChart) OR . A paper copy in the mail If you have any lab test that is abnormal or we need to change your treatment, we will call you to review the results.  Testing/Procedures: Please schedule ECHO Your physician has requested that you have an echocardiogram. Echocardiography is a painless test that uses sound waves to create images of your heart. It provides your doctor with information about the size and shape of your heart and how well your heart's chambers and valves are working. This procedure takes approximately one hour. There are no restrictions for this procedure.    Follow-Up: At [facility], you and your health needs are our priority.  As part of our continuing mission to provide you with exceptional heart care, we have created designated Provider Care Teams.  These Care Teams include your primary Cardiologist (physician) and Advanced Practice Providers (APPs -  Physician Assistants and Nurse Practitioners) who all work together to provide you with the care you need, when you need it.  We recommend signing up for the patient portal called "MyChart".  Sign up information is provided on this After Visit Summary.  MyChart is used to connect with patients for Virtual Visits (Telemedicine).  Patients are able to view lab/test results, encounter notes, upcoming appointments, etc.  Non-urgent messages can be sent to your provider as well.   To learn more about what you can do with MyChart, go to [email or web address].    Your next appointment:  December 14 in the [place] office at 9 am     Other  Instructions:

## 2019-11-10 ENCOUNTER — Encounter: Payer: Self-pay | Admitting: Family

## 2019-11-11 ENCOUNTER — Other Ambulatory Visit: Payer: Self-pay | Admitting: Family

## 2019-11-11 ENCOUNTER — Other Ambulatory Visit: Payer: Self-pay | Admitting: Cardiovascular Disease

## 2019-11-11 ENCOUNTER — Ambulatory Visit: Payer: Medicare Other

## 2019-11-11 DIAGNOSIS — G8929 Other chronic pain: Secondary | ICD-10-CM

## 2019-11-11 DIAGNOSIS — M549 Dorsalgia, unspecified: Secondary | ICD-10-CM

## 2019-11-12 ENCOUNTER — Telehealth: Payer: Self-pay | Admitting: Family

## 2019-11-12 NOTE — Telephone Encounter (Signed)
Call pt I refilled tramadol but I saw oxycodone on his chart Please confirm IF he is taking and who prescribed Please advise that he may not take tramadol on the same day he were to use oxycodone

## 2019-11-12 NOTE — Telephone Encounter (Signed)
I spoke with patient & he does not take the hydrocodone. That was for one time procedure he had.

## 2019-11-14 ENCOUNTER — Telehealth: Payer: Self-pay | Admitting: Cardiology

## 2019-11-14 NOTE — Telephone Encounter (Signed)
[name] [name] called after experiencing dizziness at church.  When he returned home his BP was 92/67 and has come back up to 125/70.  This AM he took his ozempic and along with coreg that morning.  When he arrived home also checked glucose and was >200 which is unusual for him   I expressed concern it may be due to higher dose of coreg just increased on the 26th.  He plans to monitor BP and call if this happens again.   Will send to Dr. Rockey Situ.

## 2019-11-15 ENCOUNTER — Ambulatory Visit: Payer: Medicare Other | Admitting: Pharmacist

## 2019-11-15 ENCOUNTER — Telehealth: Payer: Self-pay | Admitting: Cardiology

## 2019-11-15 ENCOUNTER — Other Ambulatory Visit: Payer: Self-pay | Admitting: Family

## 2019-11-15 DIAGNOSIS — I25118 Atherosclerotic heart disease of native coronary artery with other forms of angina pectoris: Secondary | ICD-10-CM

## 2019-11-15 DIAGNOSIS — I214 Non-ST elevation (NSTEMI) myocardial infarction: Secondary | ICD-10-CM

## 2019-11-15 DIAGNOSIS — E1165 Type 2 diabetes mellitus with hyperglycemia: Secondary | ICD-10-CM

## 2019-11-15 NOTE — Telephone Encounter (Signed)
Noted , dc'ed

## 2019-11-15 NOTE — Telephone Encounter (Signed)
Pt c/o BP issue: STAT if pt c/o blurred vision, one-sided weakness or slurred speech  1. What are your last 5 BP readings?  Yesterday ranged from 92/67 HR 60 to 97/58,  Yesterday afternoon 148/84 HR 69 This morning 141/80 HR 49  2. Are you having any other symptoms (ex. Dizziness, headache, blurred vision, passed out)? Yesterday felt like he was going to faint. Spike in Blood sugar 200+, some dizziness, but not bad. Also experiencing some loose stool.  Blood sugar this morning is 143.  3. What is your BP issue?  Patient states he called the on call Dr. Wilburn Mylar and was told it may be his medication changes .

## 2019-11-15 NOTE — Progress Notes (Signed)
I have collaborated with the care management provider regarding care management and care coordination activities outlined in this encounter and have reviewed this encounter including documentation in the note and care plan. I am certifying that I agree with the content of this note and encounter as primary care provider.   [name], NP

## 2019-11-15 NOTE — Telephone Encounter (Signed)
Sent [employer].  Will discuss with Dr. Quentin Ore and advise.

## 2019-11-15 NOTE — Patient Instructions (Addendum)
Visit Information  Goals Addressed              This Visit's Progress     Patient Stated   .  "I want to work on my blood sugars" (pt-stated)        [place] (see longtitudinal plan of care for additional care plan information)  Current Barriers:  . Social, community, and financial barriers:  o Reports fluctuant BP that he called and reported to cardiology this morning. Also worried about higher BG readings.  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 Started in Eliquis 5 mg BID. Wonders about patient assistance availability.  . Diabetes: at goal; most recent U2P 5.3%; complicated by CAD, BPH, recent hx NSTEMI . Current antihyperglycemic regimen: Ozempic 1 mg weekly, glipizide 10 mg QAM o APPROVED for Ozempic assistance through [employer] through 12/14/19 o Has been unable to tolerate metformin, IR and ER, d/t diarrhea.  o Did not tolerate Jardiance d/t urinary symptoms . Current glucose readings:  o Fasting: this morning was 143 o After meals/middays 140-160 yesterday . Cardiovascular risk reduction; follows [facility]/ [facility]- primary cardiology Ignacia Bayley, EP Dr. Quentin Ore. Cardiac MRI upcoming for evaluation of NSVT and ventricular ectopy.  o Current hypertensive regimen: HCTZ 25 mg QAM, lisinopril 10 mg QAM; carvedilol 12.5 mg BID- reduced to 6.25 mg PM yesterday per on call RN. Awaiting cardiology instruction regarding continued dosing. ICD implantation scheduled.   o Current hyperlipidemia regimen: rosuvastatin 20 mg daily, ezetimibe 10 mg daily - Rosuvastatin increased to 40 mg after NSTEMI, but patient had constipation and attributed to rosuvastatin dose increased, so self-reduced back to 10 mg daily. Increased to 20 mg daily by Sharolyn Douglas at last visit. Lipid panel/CMP upcoming.  o Current antiplatelet therapy: 81 mg daily, clopidogrel 75 mg daily (DAPT x 12 months then clopidogrel monotherapy) . Constipation: metamucil QPM, fiber bran flakes and V8 juice; notes that if he skips on this fiber  supplementation, he will have constipation  Pharmacist Clinical Goal(s):  [name] Over the next 90 days, patient will work with PharmD and primary care provider to address optimized medication management  Interventions: . Reviewed Eliquis patient assistance program availability and out of pocket spend requirement. Patient's wife met requirements, so patient will as well.  Patient will come by the office to sign off on assistance application. D/t time constraints before program closes for the calendar year, will collaborate w/ PCP for signature on application and notify cardiology. . Brief review of mechanism/purpose of anticoagulant agent.  . Encouraged continued collaboration w/ cardiology.  Patient Self Care Activities:  . Patient will check blood glucose BID, document, and provide at future appointments . Patient will check blood pressure daily, document, and provide at future appointments . Patient will take medications as prescribed . Patient will report any questions or concerns to provider   Please see past updates related to this goal by clicking on the "Past Updates" button in the selected goal         The patient verbalized understanding of instructions provided today and declined a print copy of patient instruction materials.   Plan:  - Will await patient's return call regarding out of pocket drug spend. Will collaborate w/ CPhT regarding eligibility for Eliquis assistance before the end of the calendar year.   [name], PharmD, [place], CPP Clinical Pharmacist [place] [phone]

## 2019-11-15 NOTE — Chronic Care Management (AMB) (Addendum)
Chronic Care Management   Follow Up Note   11/15/2019 Name: [name] [name] MRN: [id number] DOB: [birth year]  Referred by: [name], [name] Reason for referral : Chronic Care Management (Medication Management)   [name] [name] is a 79 y.o. year old male who is a primary care patient of [name], [name]. The CCM team was consulted for assistance with chronic disease management and care coordination needs.    Patient called over the weekend regarding medication access.  Review of patient status, including review of consultants reports, relevant laboratory and other test results, and collaboration with appropriate care team members and the patient's provider was performed as part of comprehensive patient evaluation and provision of chronic care management services.    SDOH (Social Determinants of Health) assessments performed: Yes See Care Plan activities for detailed interventions related to SDOH)  SDOH Interventions     Most Recent Value  SDOH Interventions  Financial Strain Interventions Other (Comment)  [manufacturer assistance]       Outpatient Encounter Medications as of 11/15/2019  Medication Sig  . apixaban (ELIQUIS) 5 MG TABS tablet Take 1 tablet (5 mg total) by mouth 2 (two) times daily.  [name] aspirin EC 81 MG tablet Take 81 mg by mouth daily.   . carvedilol (COREG) 12.5 MG tablet Take 1 tablet (12.5 mg total) by mouth 2 (two) times daily.  . clopidogrel (PLAVIX) 75 MG tablet Take 1 tablet (75 mg total) by mouth daily with breakfast.  . ezetimibe (ZETIA) 10 MG tablet TAKE 1 TABLET(10 MG) BY MOUTH DAILY  . fluticasone (FLONASE) 50 MCG/ACT nasal spray Place 2 sprays into both nostrils daily.  [name] glipiZIDE (GLUCOTROL) 10 MG tablet Take 10 mg by mouth daily before breakfast.  . hydrochlorothiazide (HYDRODIURIL) 25 MG tablet TAKE 1 TABLET(25 MG) BY MOUTH DAILY  . lisinopril (ZESTRIL) 10 MG tablet TAKE 1 TABLET BY MOUTH DAILY  . Multiple Vitamin (MULTIVITAMIN)  capsule Take 1 capsule by mouth daily with lunch.   . Omega-3 Fatty Acids (FISH OIL PO) Take 1 capsule by mouth daily at 12 noon.   Glory Rosebush VERIO test strip 2 (two) times daily.  [name] OZEMPIC, 1 MG/DOSE, 4 MG/3ML SOPN ADMINISTER 1 MG UNDER THE SKIN 1 TIME A WEEK  . acetaminophen (TYLENOL) 650 MG CR tablet Take 650 mg by mouth 2 (two) times daily as needed (for pain).   . hydrocortisone cream 1 % Apply 1 application topically See admin instructions. Apply to affected area/rash every 3 days  . ipratropium (ATROVENT) 0.06 % nasal spray Place 2 sprays into both nostrils 3 (three) times daily.  . nitroGLYCERIN (NITROSTAT) 0.4 MG SL tablet Place 1 tablet (0.4 mg total) under the tongue every 5 (five) minutes x 3 doses as needed for chest pain.  [name] oxyCODONE (OXY IR/ROXICODONE) 5 MG immediate release tablet Take 5 mg by mouth every 6 (six) hours as needed.   . pantoprazole (PROTONIX) 40 MG tablet Take 1 tablet (40 mg total) by mouth daily before breakfast.  . Polyvinyl Alcohol-Povidone (MURINE TEARS FOR DRY EYES OP) Place 1-2 drops into both eyes as needed (for dry eyes).   . psyllium (METAMUCIL) 58.6 % powder Take 1 packet by mouth at bedtime as needed (if no roughage is consumed that day- Mix and drink).   . rosuvastatin (CRESTOR) 20 MG tablet Take 1 tablet (20 mg total) by mouth daily.  . sodium chloride (OCEAN) 0.65 % SOLN nasal spray Place 1 spray into both nostrils as needed for congestion.  [name]  tamsulosin (FLOMAX) 0.4 MG CAPS capsule TAKE 1 CAPSULE(0.4 MG) BY MOUTH DAILY  . traMADol (ULTRAM) 50 MG tablet TAKE 1 TABLET BY MOUTH EVERY 12 HOURS AS NEEDED   No facility-administered encounter medications on file as of 11/15/2019.     Objective:   Goals Addressed              This Visit's Progress     Patient Stated   .  "I want to work on my blood sugars" (pt-stated)        [place] (see longtitudinal plan of care for additional care plan information)  Current Barriers:  . Social,  community, and financial barriers:  o Reports fluctuant BP that he called and reported to cardiology this morning. Also worried about higher BG readings.  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 Started in Eliquis 5 mg BID. Wonders about patient assistance availability.  . Diabetes: at goal; most recent Z6X 0.9%; complicated by CAD, BPH, recent hx NSTEMI . Current antihyperglycemic regimen: Ozempic 1 mg weekly, glipizide 10 mg QAM o APPROVED for Ozempic assistance through [employer] through 12/14/19 o Has been unable to tolerate metformin, IR and ER, d/t diarrhea.  o Did not tolerate Jardiance d/t urinary symptoms . Current glucose readings:  o Fasting: this morning was 143 o After meals/middays 140-160 yesterday . Cardiovascular risk reduction; follows [facility]/ [facility]- primary cardiology Ignacia Bayley, EP Dr. Quentin Ore. Cardiac MRI upcoming for evaluation of NSVT and ventricular ectopy.  o Current hypertensive regimen: HCTZ 25 mg QAM, lisinopril 10 mg QAM; carvedilol 12.5 mg BID- reduced to 6.25 mg PM yesterday per on call RN. Awaiting cardiology instruction regarding continued dosing. ICD implantation scheduled.   o Current hyperlipidemia regimen: rosuvastatin 20 mg daily, ezetimibe 10 mg daily - Rosuvastatin increased to 40 mg after NSTEMI, but patient had constipation and attributed to rosuvastatin dose increased, so self-reduced back to 10 mg daily. Increased to 20 mg daily by Sharolyn Douglas at last visit. Lipid panel/CMP upcoming.  o Current antiplatelet therapy: 81 mg daily, clopidogrel 75 mg daily (DAPT x 12 months then clopidogrel monotherapy) . Constipation: metamucil QPM, fiber bran flakes and V8 juice; notes that if he skips on this fiber supplementation, he will have constipation  Pharmacist Clinical Goal(s):  [name] Over the next 90 days, patient will work with PharmD and primary care provider to address optimized medication management  Interventions: . Reviewed Eliquis patient assistance program availability  and out of pocket spend requirement. Patient's wife met requirements, so patient will as well.  Patient will come by the office to sign off on assistance application. D/t time constraints before program closes for the calendar year, will collaborate w/ PCP for signature on application and notify cardiology. . Brief review of mechanism/purpose of anticoagulant agent.  . Encouraged continued collaboration w/ cardiology.  Patient Self Care Activities:  . Patient will check blood glucose BID, document, and provide at future appointments . Patient will check blood pressure daily, document, and provide at future appointments . Patient will take medications as prescribed . Patient will report any questions or concerns to provider   Please see past updates related to this goal by clicking on the "Past Updates" button in the selected goal          Plan:  - Will await patient's return call regarding out of pocket drug spend. Will collaborate w/ CPhT regarding eligibility for Eliquis assistance before the end of the calendar year.   [name], PharmD, [place], CPP Clinical Pharmacist   [place] [phone]

## 2019-11-16 ENCOUNTER — Ambulatory Visit: Payer: Medicare Other

## 2019-11-16 MED ORDER — CARVEDILOL 6.25 MG PO TABS: 6.2500 mg | ORAL_TABLET | Freq: Two times a day (BID) | ORAL | 3 refills | Status: DC

## 2019-11-16 NOTE — Telephone Encounter (Signed)
MyChart message sent.  Advised to return to carvedilol 6.25 mg BID per Dr. Quentin Ore.

## 2019-11-16 NOTE — Addendum Note (Signed)
Addended by: Willeen Cass A on: 11/16/2019 01:11 PM   Modules accepted: Orders

## 2019-11-18 ENCOUNTER — Ambulatory Visit: Payer: Medicare Other

## 2019-11-21 NOTE — Telephone Encounter (Signed)
Recently seen by Dr. Quentin Ore, coreg increased Will cc Dr. Everardo Beals, can we f/u and get pressures

## 2019-11-22 ENCOUNTER — Telehealth: Payer: Self-pay | Admitting: Cardiovascular Disease

## 2019-11-22 NOTE — Telephone Encounter (Signed)
See other telephone encounter. Updated and routed other call to providers for review.

## 2019-11-22 NOTE — Telephone Encounter (Addendum)
Left voicemail message to call back and sent My Chart message

## 2019-11-22 NOTE — Telephone Encounter (Signed)
Spoke with patient. 148/84 after holding the carvedilol. He just took it and was 137/83. Other readings were as follows:  124/95 141/80 108/65 115/75 109/71 129/90  Since episode at church he decreased carvedilol to 1/2 dose of the 6.25 mg and he has not had any further problems and has felt better. Advised that I would send updates to Dr. Rockey Situ & Dr. Quentin Ore. He was appreciative for the call and had no further questions at this time.

## 2019-11-22 NOTE — Telephone Encounter (Signed)
Patient returning call to pam re/: med change and bp log per gollan note   unable to link to previous phone note

## 2019-11-23 ENCOUNTER — Ambulatory Visit: Payer: Medicare Other

## 2019-11-25 ENCOUNTER — Ambulatory Visit: Payer: Medicare Other

## 2019-12-05 NOTE — Progress Notes (Signed)
Date:  12/07/2019   ID:  [name], DOB 1940/06/12, MRN [id number]  Patient Location:  [address] [place] [address]   Provider location:   [facility], [place] office  PCP:  [name], [name]  Cardiologist:  [facility]  Chief Complaint  Patient presents with  . Other    3 month follow up. Scheduled for implant by Dr. Quentin Ore 01/05/2020. Meds reviewed verbally with patient.      History of Present Illness:    [name] [name] is a 79 y.o. male  past medical history of CAD ,NSTEMI,  stenting was 1991, and  2005 , VT followed by EP DM II HTN Hyperlipidemia Melanoma Abdominal aorta atherosclerosis April 2021 :atherectomy, drug-eluting stent  STENT RESOLUTE ONYX 4.5X18, postdilated LAD and angioplasty of diagonal Who presents for routine follow-up of his coronary artery disease  LOV with myself May 2021  Non-STEMI June 2021: Hospital Catheterization chronically occluded RCA collaterals from left to right Severe proximal LAD disease 80%, stent placed Angioplasty of diagonal vessel, 75% lesion As prior patent stent to ramus/high OM  Recently seen by EP, Dr. Quentin Ore October 2021  ZIO monitor with nonsustained VT, PVCs 10% burden  Cardiac MRI Ejection fraction 35% LGE in the basal and mid inferior and inferior lateral walls, dyskinesis of the basal and inferior inferolateral walls and a left ventricular mural thrombus located on the inferolateral wall.  Good exercise capacity, walks his dog  Carvedilol increased on last clinic visit with EP, he had subsequent orthostasis symptoms, dose was reduced back  Started on Eliquis Plan for ICD Still on asa and plavix  In follow-up today he reports he is feeling well Denies any significant anginal symptoms  NO EKG today, patient declioned  On review of lab work, LFTS mildly elevated, Stable 3 months Started walking and changed diet followed by GI  Initial blood  pressure low, on my recheck 935 systolic/improved BP 701/77 at home No orthostasis  Yard work friday Injury left wrist  last Friday Left wrist pain Scheduled to see orthopedics  Active in the garden Hemoglobin A1c 7.9  trying to change his diet get his weight down LDL 55 on Crestor 10 with Zetia  Prior CV studies:   The following studies were reviewed today:  Pharmacologic nuclear stress test 03/26/2013,Lexiscan Old inferior MI. No ischemia. Well-preserved LVEF 50%. Slight hypokinesia of the inferior wall.  Echo 03/26/2013: LV borderline dilated. EF 50-55%. Mild global hypokinesis of LV. LA is moderately dilated. RV mildly dilated. Mild to moderate aortic sclerosis. Mild AI. Mild to moderate mitral valve thickening. Mild to moderate MR. RVSP 31 mmHg. Mild TR. Aortic root is normal in size. No pericardial effusion.   Past Medical History:  Diagnosis Date  . Aortic insufficiency    a. noted on TTE 2015; b. 06/2019 Echo: AI not visualized.  . Arthritis   . CAD (coronary artery disease)    a. remote PCI in 1991 and 2005; b. MV 3/15: old inferior MI, no ischemia, LVEF 50%, slight inferior wall hypokniesis; c. 06/2019 NSTEMI/PCI: LM nl, LAD 80p/m (Atherectomy & 4.5x18 Resolute Onyx DES), 24m/d, D1 75 (PTCA), RI patent stent, LCX nl, RCA 100p, RPAV fills via L->R collats from LCX.  [name] Chicken pox   . Colon polyps    4 pre-cancerous   . Diverticulitis   . DM type 2 (diabetes mellitus, type 2) ([place])   . Family history of adverse reaction to anesthesia   .  GERD (gastroesophageal reflux disease)   . Heart murmur   . History of kidney stones   . HOH (hard of hearing)   . Hyperlipidemia   . Hypertension   . Ischemic cardiomyopathy    a. TTE 2015: EF  50-55%, mild global HK; b. 06/2019 Echo: EF 45-50%, Gr1 DD, basal inf AK. Triv MR.  . Kidney stones   . Melanoma ([place]) 1980   Resected from his back  . Mitral regurgitation    a. noted on TTE 2015  . Myocardial infarction [facility])     Past Surgical History:  Procedure Laterality Date  . CHOLECYSTECTOMY  2012  . COLONOSCOPY     in 2003 with polyp removed and leak anastomosis had to have open abdominal surgery   . CORONARY ANGIOPLASTY WITH STENT PLACEMENT  1991 & 2005  . CORONARY ATHERECTOMY N/A 07/08/2019   Procedure: CORONARY ATHERECTOMY;  Surgeon: Jettie Booze, MD;  Location: [place] [facility];  Service: Cardiovascular;  Laterality: N/A;  . CORONARY BALLOON ANGIOPLASTY N/A 07/08/2019   Procedure: CORONARY BALLOON ANGIOPLASTY;  Surgeon: Jettie Booze, MD;  Location: [place] [facility];  Service: Cardiovascular;  Laterality: N/A;  diagonal   . CORONARY STENT INTERVENTION N/A 07/08/2019   Procedure: CORONARY STENT INTERVENTION;  Surgeon: Jettie Booze, MD;  Location: [place] [facility];  Service: Cardiovascular;  Laterality: N/A;  lad  . CYSTOSCOPY/URETEROSCOPY/HOLMIUM LASER/STENT PLACEMENT Right 01/02/2018   Procedure: CYSTOSCOPY/URETEROSCOPY/HOLMIUM LASER/STENT PLACEMENT;  Surgeon: [name], MD;  Location: [facility];  Service: Urology;  Laterality: Right;  . ESOPHAGOGASTRODUODENOSCOPY (EGD) WITH PROPOFOL N/A 11/19/2016   Procedure: ESOPHAGOGASTRODUODENOSCOPY (EGD) WITH PROPOFOL;  Surgeon: [name], MD;  Location: [facility];  Service: Endoscopy;  Laterality: N/A;  . ESOPHAGOGASTRODUODENOSCOPY (EGD) WITH PROPOFOL N/A 02/02/2019   Procedure: ESOPHAGOGASTRODUODENOSCOPY (EGD) WITH PROPOFOL;  Surgeon: [name], MD;  Location: [facility];  Service: Endoscopy;  Laterality: N/A;  . INTRAVASCULAR PRESSURE WIRE/FFR STUDY N/A 07/07/2019   Procedure: INTRAVASCULAR PRESSURE WIRE/FFR STUDY;  Surgeon: Nelva Bush, MD;  Location: [place] [facility];  Service: Cardiovascular;  Laterality: N/A;  . INTRAVASCULAR ULTRASOUND/IVUS N/A 07/08/2019   Procedure: Intravascular Ultrasound/IVUS;  Surgeon: Jettie Booze, MD;  Location: [place] [facility];  Service: Cardiovascular;  Laterality: N/A;   . LEFT HEART CATH N/A 07/08/2019   Procedure: Left Heart Cath;  Surgeon: Jettie Booze, MD;  Location: [place] [facility];  Service: Cardiovascular;  Laterality: N/A;  . LEFT HEART CATH AND CORONARY ANGIOGRAPHY N/A 07/07/2019   Procedure: LEFT HEART CATH AND CORONARY ANGIOGRAPHY;  Surgeon: Nelva Bush, MD;  Location: [place] [facility];  Service: Cardiovascular;  Laterality: N/A;  . LITHOTRIPSY  2015  . MELANOMA EXCISION  1980   malignant  . NERVE SURGERY  2015   ulna nerve  . TONSILLECTOMY  1945  . WISDOM TOOTH EXTRACTION       Current Meds  Medication Sig  . acetaminophen (TYLENOL) 650 MG CR tablet Take 650 mg by mouth 2 (two) times daily as needed (for pain).   [name] apixaban (ELIQUIS) 5 MG TABS tablet Take 1 tablet (5 mg total) by mouth 2 (two) times daily.  [name] aspirin EC 81 MG tablet Take 81 mg by mouth daily.   . carvedilol (COREG) 6.25 MG tablet Take 1 tablet (6.25 mg total) by mouth 2 (two) times daily.  . clopidogrel (PLAVIX) 75 MG tablet Take 1 tablet (75 mg total) by mouth daily with breakfast.  . ezetimibe (ZETIA) 10 MG  tablet TAKE 1 TABLET(10 MG) BY MOUTH DAILY  . fluticasone (FLONASE) 50 MCG/ACT nasal spray Place 2 sprays into both nostrils daily.  [name] glipiZIDE (GLUCOTROL) 10 MG tablet Take 10 mg by mouth daily before breakfast.  . hydrochlorothiazide (HYDRODIURIL) 25 MG tablet TAKE 1 TABLET(25 MG) BY MOUTH DAILY  . hydrocortisone cream 1 % Apply 1 application topically See admin instructions. Apply to affected area/rash every 3 days  . ipratropium (ATROVENT) 0.06 % nasal spray Place 2 sprays into both nostrils 3 (three) times daily.  [name] lisinopril (ZESTRIL) 10 MG tablet TAKE 1 TABLET BY MOUTH DAILY  . Multiple Vitamin (MULTIVITAMIN) capsule Take 1 capsule by mouth daily with lunch.   . nitroGLYCERIN (NITROSTAT) 0.4 MG SL tablet Place 1 tablet (0.4 mg total) under the tongue every 5 (five) minutes x 3 doses as needed for chest pain.  . Omega-3 Fatty Acids (FISH OIL  PO) Take 1 capsule by mouth daily at 12 noon.   Glory Rosebush VERIO test strip 2 (two) times daily.  [name] OZEMPIC, 1 MG/DOSE, 4 MG/3ML SOPN ADMINISTER 1 MG UNDER THE SKIN 1 TIME A WEEK  . pantoprazole (PROTONIX) 40 MG tablet Take 1 tablet (40 mg total) by mouth daily before breakfast.  . Polyvinyl Alcohol-Povidone (MURINE TEARS FOR DRY EYES OP) Place 1-2 drops into both eyes as needed (for dry eyes).   . psyllium (METAMUCIL) 58.6 % powder Take 1 packet by mouth at bedtime as needed (if no roughage is consumed that day- Mix and drink).   . rosuvastatin (CRESTOR) 20 MG tablet Take 1 tablet (20 mg total) by mouth daily.  . sodium chloride (OCEAN) 0.65 % SOLN nasal spray Place 1 spray into both nostrils as needed for congestion.  . tamsulosin (FLOMAX) 0.4 MG CAPS capsule TAKE 1 CAPSULE(0.4 MG) BY MOUTH DAILY  . traMADol (ULTRAM) 50 MG tablet TAKE 1 TABLET BY MOUTH EVERY 12 HOURS AS NEEDED     Allergies:   Metformin and related, Azithromycin, Other, Percocet [oxycodone-acetaminophen], and Jardiance [empagliflozin]   Social History   Tobacco Use  . Smoking status: Current Some Day Smoker    Types: Pipe  . Smokeless tobacco: Former Systems developer    Quit date: 10/17/2015  Vaping Use  . Vaping Use: Never used  Substance Use Topics  . Alcohol use: Not Currently    Alcohol/week: 0.0 - 1.0 standard drinks  . Drug use: No     Current Outpatient Medications on File Prior to Visit  Medication Sig Dispense Refill  . acetaminophen (TYLENOL) 650 MG CR tablet Take 650 mg by mouth 2 (two) times daily as needed (for pain).     [name] apixaban (ELIQUIS) 5 MG TABS tablet Take 1 tablet (5 mg total) by mouth 2 (two) times daily. 180 tablet 3  . aspirin EC 81 MG tablet Take 81 mg by mouth daily.     . carvedilol (COREG) 6.25 MG tablet Take 1 tablet (6.25 mg total) by mouth 2 (two) times daily. 180 tablet 3  . clopidogrel (PLAVIX) 75 MG tablet Take 1 tablet (75 mg total) by mouth daily with breakfast. 60 tablet 2  . ezetimibe  (ZETIA) 10 MG tablet TAKE 1 TABLET(10 MG) BY MOUTH DAILY 90 tablet 0  . fluticasone (FLONASE) 50 MCG/ACT nasal spray Place 2 sprays into both nostrils daily. 16 g 6  . glipiZIDE (GLUCOTROL) 10 MG tablet Take 10 mg by mouth daily before breakfast.    . hydrochlorothiazide (HYDRODIURIL) 25 MG tablet TAKE 1 TABLET(25 MG) BY  MOUTH DAILY 90 tablet 0  . hydrocortisone cream 1 % Apply 1 application topically See admin instructions. Apply to affected area/rash every 3 days    . ipratropium (ATROVENT) 0.06 % nasal spray Place 2 sprays into both nostrils 3 (three) times daily. 15 mL 12  . lisinopril (ZESTRIL) 10 MG tablet TAKE 1 TABLET BY MOUTH DAILY 90 tablet 1  . Multiple Vitamin (MULTIVITAMIN) capsule Take 1 capsule by mouth daily with lunch.     . nitroGLYCERIN (NITROSTAT) 0.4 MG SL tablet Place 1 tablet (0.4 mg total) under the tongue every 5 (five) minutes x 3 doses as needed for chest pain. 25 tablet 0  . Omega-3 Fatty Acids (FISH OIL PO) Take 1 capsule by mouth daily at 12 noon.     Glory Rosebush VERIO test strip 2 (two) times daily.    [name] OZEMPIC, 1 MG/DOSE, 4 MG/3ML SOPN ADMINISTER 1 MG UNDER THE SKIN 1 TIME A WEEK 3 mL 1  . pantoprazole (PROTONIX) 40 MG tablet Take 1 tablet (40 mg total) by mouth daily before breakfast. 30 tablet 3  . Polyvinyl Alcohol-Povidone (MURINE TEARS FOR DRY EYES OP) Place 1-2 drops into both eyes as needed (for dry eyes).     . psyllium (METAMUCIL) 58.6 % powder Take 1 packet by mouth at bedtime as needed (if no roughage is consumed that day- Mix and drink).     . rosuvastatin (CRESTOR) 20 MG tablet Take 1 tablet (20 mg total) by mouth daily. 90 tablet 3  . sodium chloride (OCEAN) 0.65 % SOLN nasal spray Place 1 spray into both nostrils as needed for congestion.    . tamsulosin (FLOMAX) 0.4 MG CAPS capsule TAKE 1 CAPSULE(0.4 MG) BY MOUTH DAILY 90 capsule 3  . traMADol (ULTRAM) 50 MG tablet TAKE 1 TABLET BY MOUTH EVERY 12 HOURS AS NEEDED 60 tablet 1   No current  facility-administered medications on file prior to visit.     Family Hx: The patient's family history includes Colon cancer in his father; Diabetes in his father and mother; Heart attack in his mother; Heart disease in his mother; Hyperlipidemia in his mother; Hypertension in his mother; Kidney cancer in his father; Liver cancer in his father; Lung cancer in his father. There is no history of Bladder Cancer or Prostate cancer.  ROS:   Please see the history of present illness.    Review of Systems  Constitutional: Negative.   HENT: Negative.   Respiratory: Negative.   Cardiovascular: Negative.   Gastrointestinal: Negative.   Musculoskeletal:       Left wrist pain  Neurological: Negative.   Psychiatric/Behavioral: Negative.   All other systems reviewed and are negative.    Labs/Other Tests and Data Reviewed:    Recent Labs: 07/07/2019: ALT 41; B Natriuretic Peptide 111.1; Magnesium 1.4 07/09/2019: BUN 10; Creatinine, Ser 0.64; Hemoglobin 14.6; Platelets 173; Potassium 4.4; Sodium 137   Recent Lipid Panel Lab Results  Component Value Date/Time   CHOL 120 02/10/2019 09:03 AM   TRIG 215.0 (H) 02/10/2019 09:03 AM   HDL 39.00 (L) 02/10/2019 09:03 AM   CHOLHDL 3 02/10/2019 09:03 AM   LDLCALC 70 03/02/2018 08:22 AM   LDLDIRECT 55.0 02/10/2019 09:03 AM    Wt Readings from Last 3 Encounters:  12/07/19 205 lb (93 kg)  11/09/19 203 lb 3.2 oz (92.2 kg)  10/13/19 203 lb (92.1 kg)     Exam:    BP 132/74 (BP Location: Left Arm, Patient Position: Sitting, Cuff Size: Normal)  Pulse 70   Ht 6' (1.829 m)   Wt 205 lb (93 kg)   SpO2 98%   BMI 27.80 kg/m   Constitutional:  oriented to person, place, and time. No distress.  HENT:  Head: Grossly normal Eyes:  no discharge. No scleral icterus.  Neck: No JVD, no carotid bruits  Cardiovascular: Regular rate and rhythm, no murmurs appreciated Pulmonary/Chest: Clear to auscultation bilaterally, no wheezes or rails Abdominal: Soft.  no  distension.  no tenderness.  Musculoskeletal: Normal range of motion Neurological:  normal muscle tone. Coordination normal. No atrophy Skin: Skin warm and dry Psychiatric: normal affect, pleasant   ASSESSMENT & PLAN:    Atherosclerosis of native coronary artery with stable angina pectoris, unspecified whether native or transplanted heart (HCC) Ischemic cardiomyopathy, ejection fraction 35% on MRI Plan for repeat echocardiogram in 2 weeks time We will discuss his triple therapy with EP and interventional cardiology, message sent 5 months out from stenting, consider dropping aspirin  Cardiomyopathy, ischemic Did not tolerate higher dose carvedilol had orthostasis Continue current dose, continue lisinopril 10 but we did discuss Entresto We will start spironolactone 12.5 mg daily then titrate up to 25 mg daily with BMP in 2 weeks time when he comes in for repeat echo  Essential hypertension Stable blood pressure, will add spironolactone for cardiomyopathy Start half dose with titration up to full dose after 1 week  Nonsustained VT, PVCs Has follow-up with EP Monitor reviewed with him Discussed possible indications for his ICD  Atherosclerosis of abdominal aorta (HCC) Aggressive lipid control recommended LDL less than 70  Type 2 diabetes mellitus with other circulatory complication, without long-term current use of insulin (HCC) A1c previous running high, walking program, strict diet recommended  Carotid artery disease, unspecified laterality ([place]) U/s in 2017 Minor carotid atherosclerosis.  No hemodynamically significant disease     Total encounter time more than 35 minutes  Greater than 50% was spent in counseling and coordination of care with the patient   Signed, Ida Rogue, MD  12/07/2019 9:18 AM    [facility] [phone], [place], [place] [address]

## 2019-12-07 ENCOUNTER — Encounter: Payer: Self-pay | Admitting: Cardiovascular Disease

## 2019-12-07 ENCOUNTER — Ambulatory Visit: Payer: Medicare Other | Admitting: Cardiovascular Disease

## 2019-12-07 ENCOUNTER — Other Ambulatory Visit: Payer: Self-pay

## 2019-12-07 VITALS — BP 132/74 | HR 70 | Ht 72.0 in | Wt 205.0 lb

## 2019-12-07 DIAGNOSIS — I255 Ischemic cardiomyopathy: Secondary | ICD-10-CM

## 2019-12-07 DIAGNOSIS — I4729 Other ventricular tachycardia: Secondary | ICD-10-CM

## 2019-12-07 DIAGNOSIS — I472 Ventricular tachycardia, unspecified: Secondary | ICD-10-CM

## 2019-12-07 DIAGNOSIS — I5022 Chronic systolic (congestive) heart failure: Secondary | ICD-10-CM

## 2019-12-07 DIAGNOSIS — I25118 Atherosclerotic heart disease of native coronary artery with other forms of angina pectoris: Secondary | ICD-10-CM

## 2019-12-07 MED ORDER — SPIRONOLACTONE 25 MG PO TABS: 25.0000 mg | ORAL_TABLET | Freq: Every day | ORAL | 3 refills | Status: DC

## 2019-12-07 NOTE — Patient Instructions (Addendum)
Medication Instructions:  Please start spironolactone 25 mg one a day (order has been sent to pharmacy)  If you need a refill on your cardiac medications before your next appointment, please call your pharmacy.    Lab work: BMP, this may be obtain when you come in for echocardiogram on Dec 7.   Walk into medical mall at the check in desk, they will direct you to lab registration, hours for labs are Monday-Friday 07:00am-5:30pm (no appointment necessary)   If you have labs (blood work) drawn today and your tests are completely normal, you will receive your results only by: [name] MyChart Message (if you have MyChart) OR . A paper copy in the mail If you have any lab test that is abnormal or we need to change your treatment, we will call you to review the results.   Testing/Procedures: No new testing needed   Follow-Up: At [facility], you and your health needs are our priority.  As part of our continuing mission to provide you with exceptional heart care, we have created designated Provider Care Teams.  These Care Teams include your primary Cardiologist (physician) and Advanced Practice Providers (APPs -  Physician Assistants and Nurse Practitioners) who all work together to provide you with the care you need, when you need it.  . You will need a follow up appointment in 3 months  . Providers on your designated Care Team:   . Murray Hodgkins, NP . Christell Faith, PA-C . Marrianne Mood, PA-C  Any Other Special Instructions Will Be Listed Below (If Applicable).  COVID-19 Vaccine Information can be found at: ShippingScam.co.uk For questions related to vaccine distribution or appointments, please email [email or web address] or call [phone].

## 2019-12-08 ENCOUNTER — Ambulatory Visit: Payer: Medicare Other | Admitting: Cardiology

## 2019-12-15 ENCOUNTER — Telehealth: Payer: Self-pay

## 2019-12-15 NOTE — Telephone Encounter (Signed)
-----   Message from [name], MD sent at 12/15/2019  2:30 PM EST ----- Regarding: FW: Triple therapy Can we call him Would recommend he stop the aspirin Stay on Plavix Eliquis Thx TG   ----- Message ----- From: Jettie Booze, MD Sent: 12/09/2019   8:30 AM EST To: [name], MD, Vickie Epley, MD Subject: RE: Triple therapy                             Yes. OK to hold aspirin.    In non-ACS cases, I will sometimes skip aspirin altogether and use Plavix Eliquis combo.  In setting of ACS, I think 1 month of triple therapy is sufficient.  THen ok to stop aspirin.  JV ----- Message ----- From: Vickie Epley, MD Sent: 12/08/2019   8:26 AM EST To: Jettie Booze, MD, [name], MD Subject: RE: Triple therapy                             From my perspective it seems reasonable but would defer to [facility] given the recent stent. Thanks, Lysbeth Galas      ----- Message ----- From: [name], MD Sent: 12/07/2019   1:27 PM EST To: Jettie Booze, MD, Vickie Epley, MD Subject: Triple therapy                                 Hi guys I wanted your input if you have a second I saw this gentleman in clinic,  he is on triple therapy aspirin Plavix Eliquis Had stent placed to LAD, angioplasty of diagonal April 2021 Being treated for mural thrombus with new addition of Eliquis -5 months in from stent, everybody okay with holding the aspirin? Details from catheter below, big stent Thx TG  Following atherectomy and multiple high-pressure balloon inflations, A drug-eluting stent was successfully placed using a STENT RESOLUTE ONYX 4.5X18, postdilated to 4.77 mm and optimized with intravascular ultrasound.

## 2019-12-15 NOTE — Telephone Encounter (Signed)
Called pt regarding Dr. Rockey Situ advice of "Would recommend he stop the aspirin, Stay on Plavix Eliquis", spoke with pt's wife [name] [name] (okay by [facility]), she verbalized understanding and will advise pt he can stop taking his daily 81mg  of ASA, but to continue on with the Plavix and Eliquis. [name] [name] verbalized understanding and is okay with current plan. Any further concerns or question her or pt will call th clinic.

## 2019-12-17 ENCOUNTER — Other Ambulatory Visit: Payer: Self-pay

## 2019-12-17 MED ORDER — PANTOPRAZOLE SODIUM 40 MG PO TBEC
40.0000 mg | DELAYED_RELEASE_TABLET | Freq: Every day | ORAL | 6 refills | Status: DC
Start: 2019-12-17 — End: 2020-06-07

## 2019-12-20 ENCOUNTER — Ambulatory Visit: Payer: Medicare Other | Admitting: Dermatology

## 2019-12-20 ENCOUNTER — Other Ambulatory Visit: Payer: Self-pay

## 2019-12-20 ENCOUNTER — Ambulatory Visit: Payer: Medicare Other | Admitting: Family

## 2019-12-20 DIAGNOSIS — D229 Melanocytic nevi, unspecified: Secondary | ICD-10-CM

## 2019-12-20 DIAGNOSIS — L814 Other melanin hyperpigmentation: Secondary | ICD-10-CM

## 2019-12-20 DIAGNOSIS — L820 Inflamed seborrheic keratosis: Secondary | ICD-10-CM

## 2019-12-20 DIAGNOSIS — L578 Other skin changes due to chronic exposure to nonionizing radiation: Secondary | ICD-10-CM

## 2019-12-20 DIAGNOSIS — L821 Other seborrheic keratosis: Secondary | ICD-10-CM

## 2019-12-20 DIAGNOSIS — Z85820 Personal history of malignant melanoma of skin: Secondary | ICD-10-CM

## 2019-12-20 DIAGNOSIS — Z1283 Encounter for screening for malignant neoplasm of skin: Secondary | ICD-10-CM | POA: Diagnosis not present

## 2019-12-20 DIAGNOSIS — D1800 Hemangioma unspecified site: Secondary | ICD-10-CM

## 2019-12-20 DIAGNOSIS — L570 Actinic keratosis: Secondary | ICD-10-CM | POA: Diagnosis not present

## 2019-12-20 NOTE — Patient Instructions (Addendum)

## 2019-12-20 NOTE — Progress Notes (Signed)
Follow-Up Visit   Subjective  [name] [name] is a 79 y.o. male who presents for the following: TBSE (Patient has a spot by left ear that has been there about 6 months, is irritated. Also a spot at top of scalp that is irritated and has been treated with LN2 prior. ). Patient has a history of melanoma.  He has a blood clot in his heart and is on 4 different blood thinners. He is scheduled tomorrow for more diagnostic testing to see if it has resolved.  The patient presents for Total-Body Skin Exam (TBSE) for skin cancer screening and mole check.  The following portions of the chart were reviewed this encounter and updated as appropriate:   Tobacco  Allergies  Meds  Problems  Med Hx  Surg Hx  Fam Hx     Review of Systems:  No other skin or systemic complaints except as noted in HPI or Assessment and Plan.  Objective  Well appearing patient in no apparent distress; mood and affect are within normal limits.  A full examination was performed including scalp, head, eyes, ears, nose, lips, neck, chest, axillae, abdomen, back, buttocks, bilateral upper extremities, bilateral lower extremities, hands, feet, fingers, toes, fingernails, and toenails. All findings within normal limits unless otherwise noted below.  Objective  Scalp x 4 (4): Erythematous thin papules/macules with gritty scale.   Objective  Left nose x 1, L neck infrauricular x 1 (2): Erythematous keratotic or waxy stuck-on papule or plaque.    Assessment & Plan  AK (actinic keratosis) (4) Scalp x 4  Destruction of lesion - Scalp x 4 Complexity: simple   Destruction method: cryotherapy   Informed consent: discussed and consent obtained   Timeout:  patient name, date of birth, surgical site, and procedure verified Lesion destroyed using liquid nitrogen: Yes   Region frozen until ice ball extended beyond lesion: Yes   Outcome: patient tolerated procedure well with no complications   Post-procedure details: wound care  instructions given    Inflamed seborrheic keratosis (2) Left nose x 1, L neck infrauricular x 1  Destruction of lesion - Left nose x 1, L neck infrauricular x 1 Complexity: simple   Destruction method: cryotherapy   Informed consent: discussed and consent obtained   Timeout:  patient name, date of birth, surgical site, and procedure verified Lesion destroyed using liquid nitrogen: Yes   Region frozen until ice ball extended beyond lesion: Yes   Outcome: patient tolerated procedure well with no complications   Post-procedure details: wound care instructions given     Lentigines - Scattered tan macules - Discussed due to sun exposure - Benign, observe - Call for any changes  Seborrheic Keratoses - Stuck-on, waxy, tan-brown papules and plaques  - Discussed benign etiology and prognosis. - Observe - Call for any changes  Melanocytic Nevi - Tan-brown and/or pink-flesh-colored symmetric macules and papules - Benign appearing on exam today - Observation - Call clinic for new or changing moles - Recommend daily use of broad spectrum spf 30+ sunscreen to sun-exposed areas.   Hemangiomas - Red papules - Discussed benign nature - Observe - Call for any changes  Actinic Damage - Chronic, secondary to cumulative UV/sun exposure - diffuse scaly erythematous macules with underlying dyspigmentation - Recommend daily broad spectrum sunscreen SPF 30+ to sun-exposed areas, reapply every 2 hours as needed.  - Call for new or changing lesions.  Skin cancer screening performed today.  History of Melanoma - No evidence of recurrence today at  right scapula - No lymphadenopathy - Recommend regular full body skin exams - Recommend daily broad spectrum sunscreen SPF 30+ to sun-exposed areas, reapply every 2 hours as needed.  - Call if any new or changing lesions are noted between office visits   Return in about 1 year (around 12/19/2020) for TBSE.  Graciella Belton, RMA, am acting as  scribe for Sarina Ser, MD . Documentation: I have reviewed the above documentation for accuracy and completeness, and I agree with the above.  Sarina Ser, MD

## 2019-12-21 ENCOUNTER — Other Ambulatory Visit
Admission: RE | Admit: 2019-12-21 | Discharge: 2019-12-21 | Disposition: A | Discharge status: Home / Self Care | Payer: Medicare Other | Attending: Cardiovascular Disease | Admitting: Cardiovascular Disease

## 2019-12-21 ENCOUNTER — Ambulatory Visit (INDEPENDENT_AMBULATORY_CARE_PROVIDER_SITE_OTHER): Payer: Medicare Other

## 2019-12-21 ENCOUNTER — Other Ambulatory Visit: Payer: Self-pay

## 2019-12-21 DIAGNOSIS — I255 Ischemic cardiomyopathy: Secondary | ICD-10-CM | POA: Diagnosis not present

## 2019-12-21 DIAGNOSIS — I472 Ventricular tachycardia: Secondary | ICD-10-CM

## 2019-12-21 DIAGNOSIS — I5022 Chronic systolic (congestive) heart failure: Secondary | ICD-10-CM | POA: Diagnosis not present

## 2019-12-21 DIAGNOSIS — E785 Hyperlipidemia, unspecified: Secondary | ICD-10-CM | POA: Insufficient documentation

## 2019-12-21 DIAGNOSIS — I4729 Other ventricular tachycardia: Secondary | ICD-10-CM

## 2019-12-21 DIAGNOSIS — I493 Ventricular premature depolarization: Secondary | ICD-10-CM | POA: Diagnosis not present

## 2019-12-21 LAB — HEPATIC FUNCTION PANEL
ALT: 36 U/L (ref 0–44)
AST: 33 U/L (ref 15–41)
Albumin: 4.1 g/dL (ref 3.5–5.0)
Alkaline Phosphatase: 53 U/L (ref 38–126)
Bilirubin, Direct: 0.2 mg/dL (ref 0.0–0.2)
Indirect Bilirubin: 1.5 mg/dL — ABNORMAL HIGH (ref 0.3–0.9)
Total Bilirubin: 1.7 mg/dL — ABNORMAL HIGH (ref 0.3–1.2)
Total Protein: 7.4 g/dL (ref 6.5–8.1)

## 2019-12-21 LAB — ECHOCARDIOGRAM COMPLETE
AR max vel: 1.2 cm2
AV Area VTI: 1.23 cm2
AV Area mean vel: 1.12 cm2
AV Mean grad: 14 mmHg
AV Peak grad: 27 mmHg
Ao pk vel: 2.6 m/s
Calc EF: 34.3 %
S' Lateral: 5.11 cm
Single Plane A2C EF: 27.1 %
Single Plane A4C EF: 37.2 %

## 2019-12-21 LAB — BASIC METABOLIC PANEL
Anion gap: 9 (ref 5–15)
BUN: 22 mg/dL (ref 8–23)
CO2: 27 mmol/L (ref 22–32)
Calcium: 9.6 mg/dL (ref 8.9–10.3)
Chloride: 101 mmol/L (ref 98–111)
Creatinine, Ser: 0.88 mg/dL (ref 0.61–1.24)
GFR, Estimated: >60 mL/min (ref >60)
Glucose, Bld: 166 mg/dL — ABNORMAL HIGH (ref 70–99)
Potassium: 4.4 mmol/L (ref 3.5–5.1)
Sodium: 137 mmol/L (ref 135–145)

## 2019-12-21 LAB — LIPID PANEL
Cholesterol: 124 mg/dL (ref 0–200)
HDL: 36 mg/dL — ABNORMAL LOW (ref >40)
LDL Cholesterol: 35 mg/dL (ref 0–99)
Total CHOL/HDL Ratio: 3.4 RATIO
Triglycerides: 267 mg/dL — ABNORMAL HIGH (ref <150)
VLDL: 53 mg/dL — ABNORMAL HIGH (ref 0–40)

## 2019-12-21 MED ORDER — PERFLUTREN LIPID MICROSPHERE
1.0000 mL | INTRAVENOUS | Status: AC | PRN
  Administered 2019-12-21: 2 mL via INTRAVENOUS

## 2019-12-22 ENCOUNTER — Encounter: Payer: Self-pay | Admitting: Family

## 2019-12-22 ENCOUNTER — Ambulatory Visit (INDEPENDENT_AMBULATORY_CARE_PROVIDER_SITE_OTHER): Payer: Medicare Other | Admitting: Family

## 2019-12-22 VITALS — BP 106/68 | HR 67 | Temp 98.3°F | Ht 72.0 in | Wt 203.0 lb

## 2019-12-22 DIAGNOSIS — I255 Ischemic cardiomyopathy: Secondary | ICD-10-CM

## 2019-12-22 DIAGNOSIS — E782 Mixed hyperlipidemia: Secondary | ICD-10-CM | POA: Diagnosis not present

## 2019-12-22 DIAGNOSIS — E118 Type 2 diabetes mellitus with unspecified complications: Secondary | ICD-10-CM | POA: Diagnosis not present

## 2019-12-22 DIAGNOSIS — I10 Essential (primary) hypertension: Secondary | ICD-10-CM | POA: Diagnosis not present

## 2019-12-22 DIAGNOSIS — E1165 Type 2 diabetes mellitus with hyperglycemia: Secondary | ICD-10-CM

## 2019-12-22 LAB — POCT GLYCOSYLATED HEMOGLOBIN (HGB A1C): Hemoglobin A1C: 7.1 % — AB (ref 4.0–5.6)

## 2019-12-22 NOTE — Progress Notes (Signed)
Subjective:    Patient ID: [name] [name], male    DOB: 05-02-1940, 79 y.o.   MRN: [id number]  CC: [name] [name] is a 79 y.o. male who presents today for follow up.   HPI: Overall feels well.  Quality of life remains the same. Able to maintain regular activities, such as walking. Feeling better, more energized at the end of walks.    HTN- lisinopril 10mg  , spironolactone 25 mg , hctz 25mg , coreg 6.25 mg BID. NO cp, sob, leg swelling, dizziness.  Ischemic cardiomyopathy- Following with [name] and appointment next week with interventional cardiology dr Quentin Ore whom is Planning ICD implant 01/03/20 for h/o VT.  MR card showed LV mural thrombis. 11/04/19.Compliant with plavix , eliquis. Stopped asa yesterday.   DM- FGG 150s. Compliant with glipizide 10mg , ozempic 1mg    HLD- LDL 35. Compliant with crestor 20mg . Declines vascepa for now.    Echo EF 35% yesterday       H/o melanoma- follows with Nehemiah Massed  HISTORY:  Past Medical History:  Diagnosis Date  . Aortic insufficiency    a. noted on TTE 2015; b. 06/2019 Echo: AI not visualized.  . Arthritis   . CAD (coronary artery disease)    a. remote PCI in 1991 and 2005; b. MV 3/15: old inferior MI, no ischemia, LVEF 50%, slight inferior wall hypokniesis; c. 06/2019 NSTEMI/PCI: LM nl, LAD 80p/m (Atherectomy & 4.5x18 Resolute Onyx DES), 33m/d, D1 75 (PTCA), RI patent stent, LCX nl, RCA 100p, RPAV fills via L->R collats from LCX.  [name] Chicken pox   . Colon polyps    4 pre-cancerous   . Diverticulitis   . DM type 2 (diabetes mellitus, type 2) ([place])   . Family history of adverse reaction to anesthesia   . GERD (gastroesophageal reflux disease)   . Heart murmur   . History of kidney stones   . HOH (hard of hearing)   . Hyperlipidemia   . Hypertension   . Ischemic cardiomyopathy    a. TTE 2015: EF  50-55%, mild global HK; b. 06/2019 Echo: EF 45-50%, Gr1 DD, basal inf AK. Triv MR.  . Kidney stones   . Melanoma ([place]) 1980    Resected from his back  . Mitral regurgitation    a. noted on TTE 2015  . Myocardial infarction [facility])    Past Surgical History:  Procedure Laterality Date  . CHOLECYSTECTOMY  2012  . COLONOSCOPY     in 2003 with polyp removed and leak anastomosis had to have open abdominal surgery   . CORONARY ANGIOPLASTY WITH STENT PLACEMENT  1991 & 2005  . CORONARY ATHERECTOMY N/A 07/08/2019   Procedure: CORONARY ATHERECTOMY;  Surgeon: Jettie Booze, MD;  Location: [place] [facility];  Service: Cardiovascular;  Laterality: N/A;  . CORONARY BALLOON ANGIOPLASTY N/A 07/08/2019   Procedure: CORONARY BALLOON ANGIOPLASTY;  Surgeon: Jettie Booze, MD;  Location: [place] [facility];  Service: Cardiovascular;  Laterality: N/A;  diagonal   . CORONARY STENT INTERVENTION N/A 07/08/2019   Procedure: CORONARY STENT INTERVENTION;  Surgeon: Jettie Booze, MD;  Location: [place] [facility];  Service: Cardiovascular;  Laterality: N/A;  lad  . CYSTOSCOPY/URETEROSCOPY/HOLMIUM LASER/STENT PLACEMENT Right 01/02/2018   Procedure: CYSTOSCOPY/URETEROSCOPY/HOLMIUM LASER/STENT PLACEMENT;  Surgeon: [name], MD;  Location: [facility];  Service: Urology;  Laterality: Right;  . ESOPHAGOGASTRODUODENOSCOPY (EGD) WITH PROPOFOL N/A 11/19/2016   Procedure: ESOPHAGOGASTRODUODENOSCOPY (EGD) WITH PROPOFOL;  Surgeon: [name], MD;  Location: [facility];  Service: Endoscopy;  Laterality: N/A;  . ESOPHAGOGASTRODUODENOSCOPY (EGD) WITH PROPOFOL N/A 02/02/2019   Procedure: ESOPHAGOGASTRODUODENOSCOPY (EGD) WITH PROPOFOL;  Surgeon: [name], MD;  Location: [facility];  Service: Endoscopy;  Laterality: N/A;  . INTRAVASCULAR PRESSURE WIRE/FFR STUDY N/A 07/07/2019   Procedure: INTRAVASCULAR PRESSURE WIRE/FFR STUDY;  Surgeon: Nelva Bush, MD;  Location: [place] [facility];  Service: Cardiovascular;  Laterality: N/A;  . INTRAVASCULAR ULTRASOUND/IVUS N/A 07/08/2019   Procedure: Intravascular Ultrasound/IVUS;   Surgeon: Jettie Booze, MD;  Location: [place] [facility];  Service: Cardiovascular;  Laterality: N/A;  . LEFT HEART CATH N/A 07/08/2019   Procedure: Left Heart Cath;  Surgeon: Jettie Booze, MD;  Location: [place] [facility];  Service: Cardiovascular;  Laterality: N/A;  . LEFT HEART CATH AND CORONARY ANGIOGRAPHY N/A 07/07/2019   Procedure: LEFT HEART CATH AND CORONARY ANGIOGRAPHY;  Surgeon: Nelva Bush, MD;  Location: [place] [facility];  Service: Cardiovascular;  Laterality: N/A;  . LITHOTRIPSY  2015  . MELANOMA EXCISION  1980   malignant  . NERVE SURGERY  2015   ulna nerve  . TONSILLECTOMY  1945  . WISDOM TOOTH EXTRACTION     Family History  Problem Relation Age of Onset  . Hyperlipidemia Mother   . Hypertension Mother   . Heart disease Mother   . Diabetes Mother   . Heart attack Mother   . Colon cancer Father   . Lung cancer Father   . Kidney cancer Father        malignant capsulated kidney tumor  . Diabetes Father   . Liver cancer Father   . Bladder Cancer Neg Hx   . Prostate cancer Neg Hx     Allergies: Metformin and related, Azithromycin, Other, Percocet [oxycodone-acetaminophen], and Jardiance [empagliflozin] Current Outpatient Medications on File Prior to Visit  Medication Sig Dispense Refill  . acetaminophen (TYLENOL) 650 MG CR tablet Take 650 mg by mouth 2 (two) times daily as needed (for pain).     [name] apixaban (ELIQUIS) 5 MG TABS tablet Take 1 tablet (5 mg total) by mouth 2 (two) times daily. 180 tablet 3  . carvedilol (COREG) 6.25 MG tablet Take 1 tablet (6.25 mg total) by mouth 2 (two) times daily. 180 tablet 3  . clopidogrel (PLAVIX) 75 MG tablet Take 1 tablet (75 mg total) by mouth daily with breakfast. 60 tablet 2  . ezetimibe (ZETIA) 10 MG tablet TAKE 1 TABLET(10 MG) BY MOUTH DAILY 90 tablet 0  . fluticasone (FLONASE) 50 MCG/ACT nasal spray Place 2 sprays into both nostrils daily. 16 g 6  . glipiZIDE (GLUCOTROL) 10 MG tablet Take 10 mg by  mouth daily before breakfast.    . hydrochlorothiazide (HYDRODIURIL) 25 MG tablet TAKE 1 TABLET(25 MG) BY MOUTH DAILY 90 tablet 0  . hydrocortisone cream 1 % Apply 1 application topically See admin instructions. Apply to affected area/rash every 3 days    . ipratropium (ATROVENT) 0.06 % nasal spray Place 2 sprays into both nostrils 3 (three) times daily. 15 mL 12  . lisinopril (ZESTRIL) 10 MG tablet TAKE 1 TABLET BY MOUTH DAILY 90 tablet 1  . Multiple Vitamin (MULTIVITAMIN) capsule Take 1 capsule by mouth daily with lunch.     . nitroGLYCERIN (NITROSTAT) 0.4 MG SL tablet Place 1 tablet (0.4 mg total) under the tongue every 5 (five) minutes x 3 doses as needed for chest pain. 25 tablet 0  . Omega-3 Fatty Acids (FISH OIL PO) Take 1 capsule by mouth daily at 12 noon.     [name]  ONETOUCH VERIO test strip 2 (two) times daily.    [name] OZEMPIC, 1 MG/DOSE, 4 MG/3ML SOPN ADMINISTER 1 MG UNDER THE SKIN 1 TIME A WEEK 3 mL 1  . pantoprazole (PROTONIX) 40 MG tablet Take 1 tablet (40 mg total) by mouth daily before breakfast. 30 tablet 6  . Polyvinyl Alcohol-Povidone (MURINE TEARS FOR DRY EYES OP) Place 1-2 drops into both eyes as needed (for dry eyes).     . psyllium (METAMUCIL) 58.6 % powder Take 1 packet by mouth at bedtime as needed (if no roughage is consumed that day- Mix and drink).     . rosuvastatin (CRESTOR) 20 MG tablet Take 1 tablet (20 mg total) by mouth daily. 90 tablet 3  . sodium chloride (OCEAN) 0.65 % SOLN nasal spray Place 1 spray into both nostrils as needed for congestion.    [name] spironolactone (ALDACTONE) 25 MG tablet Take 1 tablet (25 mg total) by mouth daily. 90 tablet 3  . tamsulosin (FLOMAX) 0.4 MG CAPS capsule TAKE 1 CAPSULE(0.4 MG) BY MOUTH DAILY 90 capsule 3  . traMADol (ULTRAM) 50 MG tablet TAKE 1 TABLET BY MOUTH EVERY 12 HOURS AS NEEDED 60 tablet 1  . aspirin EC 81 MG tablet Take 81 mg by mouth daily.  (Patient not taking: Reported on 12/22/2019)     No current facility-administered  medications on file prior to visit.    Social History   Tobacco Use  . Smoking status: Current Some Day Smoker    Types: Pipe  . Smokeless tobacco: Former Systems developer    Quit date: 10/17/2015  Vaping Use  . Vaping Use: Never used  Substance Use Topics  . Alcohol use: Not Currently    Alcohol/week: 0.0 - 1.0 standard drinks  . Drug use: No    Review of Systems  Constitutional: Negative for chills and fever.  Respiratory: Negative for cough and shortness of breath.   Cardiovascular: Negative for chest pain, palpitations and leg swelling.  Gastrointestinal: Negative for nausea and vomiting.      Objective:    BP 106/68   Pulse 67   Temp 98.3 F (36.8 C)   Ht 6' (1.829 m)   Wt 203 lb (92.1 kg)   SpO2 98%   BMI 27.53 kg/m  BP Readings from Last 3 Encounters:  12/22/19 106/68  12/07/19 132/74  11/09/19 126/78   Wt Readings from Last 3 Encounters:  12/22/19 203 lb (92.1 kg)  12/07/19 205 lb (93 kg)  11/09/19 203 lb 3.2 oz (92.2 kg)    Physical Exam Vitals reviewed.  Constitutional:      Appearance: He is well-developed.  Cardiovascular:     Rate and Rhythm: Regular rhythm.     Heart sounds: Normal heart sounds.  Pulmonary:     Effort: Pulmonary effort is normal. No respiratory distress.     Breath sounds: Normal breath sounds. No wheezing, rhonchi or rales.  Skin:    General: Skin is warm and dry.  Neurological:     Mental Status: He is alert.  Psychiatric:        Speech: Speech normal.        Behavior: Behavior normal.        Assessment & Plan:   Problem List Items Addressed This Visit      Cardiovascular and Mediastinum   Essential hypertension    Controlled. Continue lisinopril 10mg  , spironolactone 25 mg , hctz 25mg , coreg 6.25 mg BID      Ischemic cardiomyopathy  Worsening of EF based on echo yesterday. H/o VT.  Pending ICD implantation with Dr Quentin Ore. Will follow         Endocrine   Controlled type 2 diabetes mellitus with complication,  without long-term current use of insulin (HCC)    Stable.  Lab Results  Component Value Date   HGBA1C 7.1 (A) 12/22/2019   Continue glipizide 10mg , ozempic 1mg          Other   Mixed hyperlipidemia    Stable. Continue zetia 10mg  and crestor 20mg        Other Visit Diagnoses    Type 2 diabetes mellitus with hyperglycemia, without long-term current use of insulin ([place])    -  Primary   Relevant Orders   POCT HgB A1C (Completed)       I am having [name] [name] "Ben" maintain his aspirin EC, fluticasone, Omega-3 Fatty Acids (FISH OIL PO), multivitamin, psyllium, Polyvinyl Alcohol-Povidone (MURINE TEARS FOR DRY EYES OP), acetaminophen, ipratropium, sodium chloride, hydrocortisone cream, nitroGLYCERIN, clopidogrel, glipiZIDE, OneTouch Verio, hydrochlorothiazide, Ozempic (1 MG/DOSE), tamsulosin, lisinopril, rosuvastatin, apixaban, ezetimibe, traMADol, carvedilol, spironolactone, and pantoprazole.   No orders of the defined types were placed in this encounter.   Return precautions given.   Risks, benefits, and alternatives of the medications and treatment plan prescribed today were discussed, and patient expressed understanding.   Education regarding symptom management and diagnosis given to patient on AVS.  Continue to follow with [name], [name] for routine health maintenance.   [name] [name] and I agreed with plan.   [name], [name]

## 2019-12-22 NOTE — Patient Instructions (Signed)
Nice to see you!   

## 2019-12-22 NOTE — Assessment & Plan Note (Signed)
Stable.  Lab Results  Component Value Date   HGBA1C 7.1 (A) 12/22/2019   Continue glipizide 10mg , ozempic 1mg 

## 2019-12-22 NOTE — Assessment & Plan Note (Signed)
Controlled. Continue lisinopril 10mg  , spironolactone 25 mg , hctz 25mg , coreg 6.25 mg BID

## 2019-12-22 NOTE — Assessment & Plan Note (Signed)
Stable. Continue zetia 10mg  and crestor 20mg 

## 2019-12-22 NOTE — Assessment & Plan Note (Signed)
Worsening of EF based on echo yesterday. H/o VT.  Pending ICD implantation with Dr Quentin Ore. Will follow

## 2019-12-24 ENCOUNTER — Encounter: Payer: Self-pay | Admitting: Dermatology

## 2019-12-28 ENCOUNTER — Encounter: Payer: Self-pay | Admitting: Cardiology

## 2019-12-28 ENCOUNTER — Other Ambulatory Visit: Payer: Self-pay

## 2019-12-28 ENCOUNTER — Ambulatory Visit: Payer: Medicare Other | Admitting: Cardiology

## 2019-12-28 VITALS — BP 102/64 | HR 79 | Ht 72.0 in | Wt 202.0 lb

## 2019-12-28 DIAGNOSIS — I214 Non-ST elevation (NSTEMI) myocardial infarction: Secondary | ICD-10-CM

## 2019-12-28 DIAGNOSIS — I472 Ventricular tachycardia, unspecified: Secondary | ICD-10-CM

## 2019-12-28 DIAGNOSIS — I255 Ischemic cardiomyopathy: Secondary | ICD-10-CM

## 2019-12-28 NOTE — Patient Instructions (Signed)
Medication Instructions:  Your physician recommends that you continue on your current medications as directed. Please refer to the Current Medication list given to you today.  *If you need a refill on your cardiac medications before your next appointment, please call your pharmacy*   Lab Work: None ordered.  If you have labs (blood work) drawn today and your tests are completely normal, you will receive your results only by: . MyChart Message (if you have MyChart) OR . A paper copy in the mail If you have any lab test that is abnormal or we need to change your treatment, we will call you to review the results.   Testing/Procedures: None ordered.    Follow-Up: At [facility], you and your health needs are our priority.  As part of our continuing mission to provide you with exceptional heart care, we have created designated Provider Care Teams.  These Care Teams include your primary Cardiologist (physician) and Advanced Practice Providers (APPs -  Physician Assistants and Nurse Practitioners) who all work together to provide you with the care you need, when you need it.  We recommend signing up for the patient portal called "MyChart".  Sign up information is provided on this After Visit Summary.  MyChart is used to connect with patients for Virtual Visits (Telemedicine).  Patients are able to view lab/test results, encounter notes, upcoming appointments, etc.  Non-urgent messages can be sent to your provider as well.   To learn more about what you can do with MyChart, go to [email or web address].    Your next appointment:   As scheduled  

## 2019-12-28 NOTE — Progress Notes (Signed)
Electrophysiology Office Follow up Visit Note:    Date:  12/28/2019   ID:  [name] [name], DOB 1940-03-02, MRN [id number]  PCP:  [name], [facility] Cardiologist:  Ida Rogue, MD  [facility] Electrophysiologist:  Vickie Epley, MD    Interval History:    [name] [name] is a 79 y.o. male who presents for a follow up visit. They were last seen in clinic November 09, 2019 for his ischemic cardiomyopathy and history of ventricular tachycardia.  His history of ventricular tachycardia dates back to June when he presented with an NSTEMI.  When he arrived to the hospital he was in a monomorphic wide-complex tachycardia consistent with VT.  This ultimately broke spontaneously to sinus bradycardia.  At her last appointment he reported NYHA class II symptoms.  During that appointment we reviewed a recent cardiac MRI which showed a significant scar burden in the left ventricle and a left ventricular mural thrombus.  After that appointment, the plan was to continue anticoagulation for 6 weeks to allow the left ventricular mural thrombus to resolve.  He has since had a repeat echocardiogram with contrast which demonstrated resolution of the left ventricular mural thrombus.  By echo volumes, ejection fraction is stable around 34 to 35%.  The global strain estimate of left ventricular function is also approximately 30%.  His symptoms are overall stable.    Past Medical History:  Diagnosis Date  . Aortic insufficiency    a. noted on TTE 2015; b. 06/2019 Echo: AI not visualized.  . Arthritis   . CAD (coronary artery disease)    a. remote PCI in 1991 and 2005; b. MV 3/15: old inferior MI, no ischemia, LVEF 50%, slight inferior wall hypokniesis; c. 06/2019 NSTEMI/PCI: LM nl, LAD 80p/m (Atherectomy & 4.5x18 Resolute Onyx DES), 74m/d, D1 75 (PTCA), RI patent stent, LCX nl, RCA 100p, RPAV fills via L->R collats from LCX.  [name] Chicken pox   . Colon polyps    4 pre-cancerous   .  Diverticulitis   . DM type 2 (diabetes mellitus, type 2) ([name])   . Family history of adverse reaction to anesthesia   . GERD (gastroesophageal reflux disease)   . Heart murmur   . History of kidney stones   . HOH (hard of hearing)   . Hyperlipidemia   . Hypertension   . Ischemic cardiomyopathy    a. TTE 2015: EF  50-55%, mild global HK; b. 06/2019 Echo: EF 45-50%, Gr1 DD, basal inf AK. Triv MR.  . Kidney stones   . Melanoma ([name]) 1980   Resected from his back  . Mitral regurgitation    a. noted on TTE 2015  . Myocardial infarction [facility])     Past Surgical History:  Procedure Laterality Date  . CHOLECYSTECTOMY  2012  . COLONOSCOPY     in 2003 with polyp removed and leak anastomosis had to have open abdominal surgery   . CORONARY ANGIOPLASTY WITH STENT PLACEMENT  1991 & 2005  . CORONARY ATHERECTOMY N/A 07/08/2019   Procedure: CORONARY ATHERECTOMY;  Surgeon: Jettie Booze, MD;  Location: [place] CV [facility];  Service: Cardiovascular;  Laterality: N/A;  . CORONARY BALLOON ANGIOPLASTY N/A 07/08/2019   Procedure: CORONARY BALLOON ANGIOPLASTY;  Surgeon: Jettie Booze, MD;  Location: [place] [facility];  Service: Cardiovascular;  Laterality: N/A;  diagonal   . CORONARY STENT INTERVENTION N/A 07/08/2019   Procedure: CORONARY STENT INTERVENTION;  Surgeon: Jettie Booze, MD;  Location: [facility]  [facility];  Service: Cardiovascular;  Laterality: N/A;  lad  . CYSTOSCOPY/URETEROSCOPY/HOLMIUM LASER/STENT PLACEMENT Right 01/02/2018   Procedure: CYSTOSCOPY/URETEROSCOPY/HOLMIUM LASER/STENT PLACEMENT;  Surgeon: [name], MD;  Location: [facility] ORS;  Service: Urology;  Laterality: Right;  . ESOPHAGOGASTRODUODENOSCOPY (EGD) WITH PROPOFOL N/A 11/19/2016   Procedure: ESOPHAGOGASTRODUODENOSCOPY (EGD) WITH PROPOFOL;  Surgeon: [name], MD;  Location: [facility] ENDOSCOPY;  Service: Endoscopy;  Laterality: N/A;  . ESOPHAGOGASTRODUODENOSCOPY (EGD) WITH PROPOFOL N/A 02/02/2019   Procedure:  ESOPHAGOGASTRODUODENOSCOPY (EGD) WITH PROPOFOL;  Surgeon: [name], MD;  Location: [facility] ENDOSCOPY;  Service: Endoscopy;  Laterality: N/A;  . INTRAVASCULAR PRESSURE WIRE/FFR STUDY N/A 07/07/2019   Procedure: INTRAVASCULAR PRESSURE WIRE/FFR STUDY;  Surgeon: Nelva Bush, MD;  Location: [place] [facility];  Service: Cardiovascular;  Laterality: N/A;  . INTRAVASCULAR ULTRASOUND/IVUS N/A 07/08/2019   Procedure: Intravascular Ultrasound/IVUS;  Surgeon: Jettie Booze, MD;  Location: [place] [facility];  Service: Cardiovascular;  Laterality: N/A;  . LEFT HEART CATH N/A 07/08/2019   Procedure: Left Heart Cath;  Surgeon: Jettie Booze, MD;  Location: [place] [facility];  Service: Cardiovascular;  Laterality: N/A;  . LEFT HEART CATH AND CORONARY ANGIOGRAPHY N/A 07/07/2019   Procedure: LEFT HEART CATH AND CORONARY ANGIOGRAPHY;  Surgeon: Nelva Bush, MD;  Location: [place] [facility];  Service: Cardiovascular;  Laterality: N/A;  . LITHOTRIPSY  2015  . MELANOMA EXCISION  1980   malignant  . NERVE SURGERY  2015   ulna nerve  . TONSILLECTOMY  1945  . WISDOM TOOTH EXTRACTION      Current Medications: Current Meds  Medication Sig  . acetaminophen (TYLENOL) 650 MG CR tablet Take 650 mg by mouth 2 (two) times daily as needed (for pain).   [name] apixaban (ELIQUIS) 5 MG TABS tablet Take 1 tablet (5 mg total) by mouth 2 (two) times daily.  [name] aspirin EC 81 MG tablet Take 81 mg by mouth daily.  . carvedilol (COREG) 6.25 MG tablet Take 1 tablet (6.25 mg total) by mouth 2 (two) times daily.  . clopidogrel (PLAVIX) 75 MG tablet Take 1 tablet (75 mg total) by mouth daily with breakfast.  . ezetimibe (ZETIA) 10 MG tablet TAKE 1 TABLET(10 MG) BY MOUTH DAILY  . fluticasone (FLONASE) 50 MCG/ACT nasal spray Place 2 sprays into both nostrils daily.  [name] glipiZIDE (GLUCOTROL) 10 MG tablet Take 10 mg by mouth daily before breakfast.  . hydrochlorothiazide (HYDRODIURIL) 25 MG tablet TAKE 1 TABLET(25 MG)  BY MOUTH DAILY  . hydrocortisone cream 1 % Apply 1 application topically See admin instructions. Apply to affected area/rash every 3 days  . ipratropium (ATROVENT) 0.06 % nasal spray Place 2 sprays into both nostrils 3 (three) times daily.  [name] lisinopril (ZESTRIL) 10 MG tablet TAKE 1 TABLET BY MOUTH DAILY  . Multiple Vitamin (MULTIVITAMIN) capsule Take 1 capsule by mouth daily with lunch.   . nitroGLYCERIN (NITROSTAT) 0.4 MG SL tablet Place 1 tablet (0.4 mg total) under the tongue every 5 (five) minutes x 3 doses as needed for chest pain.  . Omega-3 Fatty Acids (FISH OIL PO) Take 1 capsule by mouth daily at 12 noon.   Glory Rosebush VERIO test strip 2 (two) times daily.  [name] OZEMPIC, 1 MG/DOSE, 4 MG/3ML SOPN ADMINISTER 1 MG UNDER THE SKIN 1 TIME A WEEK  . pantoprazole (PROTONIX) 40 MG tablet Take 1 tablet (40 mg total) by mouth daily before breakfast.  . Polyvinyl Alcohol-Povidone (MURINE TEARS FOR DRY EYES OP) Place 1-2 drops into both eyes as  needed (for dry eyes).   . psyllium (METAMUCIL) 58.6 % powder Take 1 packet by mouth at bedtime as needed (if no roughage is consumed that day- Mix and drink).   . rosuvastatin (CRESTOR) 20 MG tablet Take 1 tablet (20 mg total) by mouth daily.  . sodium chloride (OCEAN) 0.65 % SOLN nasal spray Place 1 spray into both nostrils as needed for congestion.  [name] spironolactone (ALDACTONE) 25 MG tablet Take 1 tablet (25 mg total) by mouth daily.  . tamsulosin (FLOMAX) 0.4 MG CAPS capsule TAKE 1 CAPSULE(0.4 MG) BY MOUTH DAILY  . traMADol (ULTRAM) 50 MG tablet TAKE 1 TABLET BY MOUTH EVERY 12 HOURS AS NEEDED     Allergies:   Metformin and related, Azithromycin, Other, Percocet [oxycodone-acetaminophen], and Jardiance [empagliflozin]   Social History   Socioeconomic History  . Marital status: Married    Spouse name: Not on file  . Number of children: Not on file  . Years of education: Not on file  . Highest education level: Not on file  Occupational History  . Not  on file  Tobacco Use  . Smoking status: Current Some Day Smoker    Types: Pipe  . Smokeless tobacco: Former Systems developer    Quit date: 10/17/2015  Vaping Use  . Vaping Use: Never used  Substance and Sexual Activity  . Alcohol use: Not Currently    Alcohol/week: 0.0 - 1.0 standard drinks  . Drug use: No  . Sexual activity: Not on file  Other Topics Concern  . Not on file  Social History Narrative   Married    Social Determinants of Health   Financial Resource Strain: Medium Risk  . Difficulty of Paying Living Expenses: Somewhat hard  Food Insecurity: Not on file  Transportation Needs: Not on file  Physical Activity: Not on file  Stress: Not on file  Social Connections: Not on file     Family History: The patient's family history includes Colon cancer in his father; Diabetes in his father and mother; Heart attack in his mother; Heart disease in his mother; Hyperlipidemia in his mother; Hypertension in his mother; Kidney cancer in his father; Liver cancer in his father; Lung cancer in his father. There is no history of Bladder Cancer or Prostate cancer.  ROS:   Please see the history of present illness.    All other systems reviewed and are negative.  EKGs/Labs/Other Studies Reviewed:    The following studies were reviewed today: Echo, prior notes  December 21, 2019 echo personally reviewed Left ventricular function significantly reduced, 35% Findings suggestive of a inferior scar No evidence of a left ventricular mural thrombus Mildly dilated left ventricle Right ventricular function normal  EKG:  The ekg ordered today demonstrates sinus rhythm, nonspecific interventricular conduction delay, left axis deviation, normal QTC, no PVCs  Recent Labs: 07/07/2019: B Natriuretic Peptide 111.1; Magnesium 1.4 07/09/2019: Hemoglobin 14.6; Platelets 173 12/21/2019: ALT 36; BUN 22; Creatinine, Ser 0.88; Potassium 4.4; Sodium 137  Recent Lipid Panel    Component Value Date/Time   CHOL 124  12/21/2019 0919   TRIG 267 (H) 12/21/2019 0919   HDL 36 (L) 12/21/2019 0919   CHOLHDL 3.4 12/21/2019 0919   VLDL 53 (H) 12/21/2019 0919   LDLCALC 35 12/21/2019 0919   LDLDIRECT 55.0 02/10/2019 0903    Physical Exam:    VS:  BP 102/64   Pulse 79   Ht 6' (1.829 m)   Wt 202 lb (91.6 kg)   SpO2 98%  BMI 27.40 kg/m     Wt Readings from Last 3 Encounters:  12/28/19 202 lb (91.6 kg)  12/22/19 203 lb (92.1 kg)  12/07/19 205 lb (93 kg)     GEN:  Well nourished, well developed in no acute distress HEENT: Normal NECK: No JVD; No carotid bruits LYMPHATICS: No lymphadenopathy CARDIAC: RRR, no murmurs, rubs, gallops RESPIRATORY:  Clear to auscultation without rales, wheezing or rhonchi  ABDOMEN: Soft, non-tender, non-distended MUSCULOSKELETAL:  No edema; No deformity  SKIN: Warm and dry NEUROLOGIC:  Alert and oriented x 3 PSYCHIATRIC:  Normal affect   ASSESSMENT:    1. Ischemic cardiomyopathy   2. VT (ventricular tachycardia) ([place])   3. NSTEMI (non-ST elevated myocardial infarction) ([place])    PLAN:    In order of problems listed above:  1. Chronic systolic heart failure secondary to severe coronary artery disease Ejection fraction remains around 35% it was reported as 35-40 but on my personal review of the three-dimensional volumes, EF is closer to 35.  Cardiac MRI confirms EF of 35% as well.  There is significant scar burden secondary to his history of coronary artery disease.  Given his persistently reduced left ventricular function, evidence of scar on cardiac MRI and presence of nonsustained ventricular tachycardia, favor implanting a defibrillator for secondary prevention.  We will plan on a [employer] single coil VVI ICD.  2.  Left ventricular mural thrombus Now resolved. We will plan on a total of 3 months of anticoagulation for his left ventricular mural thrombus.  Can plan to implant his defibrillator at the conclusion of his 3 months of anticoagulation.  This  will be around the end of January.  We will plan on performing the procedure at [facility].  3.  Ventricular tachycardia Has a history of sustained monomorphic ventricular tachycardia in June.  While this was in the setting of an NSTEMI, the monomorphic nature of his VT suggest it was scar mediated.  Plan for ICD for secondary prevention.   The patient has an ischemic CM (EF 35%), NYHA Class II CHF, and CAD.  At this time, he meets criteria for ICD implantation for secondary prevention of sudden death.  I have had a thorough discussion with the patient reviewing options.  The patient and their family (if available) have had opportunities to ask questions and have them answered. The patient and I have decided together through a shared decision making process to proceed with ICD implant.   Risks, benefits, alternatives to ICD implantation were discussed in detail with the patient today. The patient understands that the risks include but are not limited to bleeding, infection, pneumothorax, perforation, tamponade, vascular damage, renal failure, MI, stroke, death, inappropriate shocks, and lead dislodgement and wishes to proceed.    Medication Adjustments/Labs and Tests Ordered: Current medicines are reviewed at length with the patient today.  Concerns regarding medicines are outlined above.  Orders Placed This Encounter  Procedures  . EKG 12-Lead   No orders of the defined types were placed in this encounter.    Signed, Lars Mage, MD, [facility]  12/28/2019 9:35 AM    Electrophysiology [facility]

## 2019-12-29 ENCOUNTER — Ambulatory Visit: Payer: Medicare Other | Admitting: Pharmacist

## 2019-12-29 DIAGNOSIS — I10 Essential (primary) hypertension: Secondary | ICD-10-CM

## 2019-12-29 DIAGNOSIS — E782 Mixed hyperlipidemia: Secondary | ICD-10-CM

## 2019-12-29 DIAGNOSIS — I25118 Atherosclerotic heart disease of native coronary artery with other forms of angina pectoris: Secondary | ICD-10-CM

## 2019-12-29 DIAGNOSIS — I255 Ischemic cardiomyopathy: Secondary | ICD-10-CM

## 2019-12-29 DIAGNOSIS — E1165 Type 2 diabetes mellitus with hyperglycemia: Secondary | ICD-10-CM

## 2019-12-29 NOTE — Patient Instructions (Signed)
Visit Information  Patient Care Plan: Medication Management    Problem Identified: Diabetes, CAD, HTN, HLD, AFib     Long-Range Goal: Disease Progression Prevention   This Visit's Progress: On track  Priority: High  Note:   Current Barriers:  . Unable to independently afford treatment regimen . Unable to achieve control of diabetes   Pharmacist Clinical Goal(s):  [name] Over the next 90 days, patient will verbalize ability to afford treatment regimen . Over the next 90 days, patient will maintain control of diabetes as evidenced by A1c  through collaboration with PharmD and provider.   Interventions: . 1:1 collaboration with [name], [name] regarding development and update of comprehensive plan of care as evidenced by provider attestation and co-signature . Inter-disciplinary care team collaboration (see longitudinal plan of care) . Comprehensive medication review performed; medication list updated in electronic medical record  Diabetes: . Uncontrolled; current treatment: Ozempic 1 mg weekly, glipizide 10 mg QAM  o Has been unable to tolerate metformin, IR and ER, d/t diarrhea.  o Did not tolerate Jardiance d/t urinary symptoms . Reports more stress over the past few weeks with cardiology concerns.  . Denies episodes of hypoglycemia . Current glucose readings: fastings: 130-140s; 2 hour post prandials: 140-160s . Recommend to continue current regimen at this time. Due to reapply for assistance to [employer]. Will collaborate w/ CPhT to mail patient her portion of application, and will collaborate w/ PCP for prescriber portion.  . Patient may run out of Novo supply before 2022 supply arrives. Recommend PCP send 1 month supply of Ozempic to local pharmacy to have on hold in case patient needs to fill. He is amenable to this plan. Script pended for PCP if in agreement.  Hypertension, Ventricular Ectopy, s/p . Controlled; current treatment: carvedilol 6.25 mg BID, spironolactone  12.5 mg QAM, lisinopril 10 mg QAM, HCTZ 25 mg daily. Primary cardiologist Ignacia Bayley, NP; EP Dr. Quentin Ore. Upcoming ICD placement.  . Recommend to continue current regimen with cardiology collaboration at this time.   Hyperlipidemia: . Controlled; current treatment: rosuvastatin 20 mg daily, ezetimibe 10 mg daily   . Recommend to continue current regimen at this time  Thomboembolic Event (s/p LV mural thrombus): [name] Appropriately managed; Eliquis 5 mg BID . Antiplatelet regimen: clopidogrel 75 mg daily (reports aspirin was d/c) . Recommend to continue current regimen at this time. Reviewed that pending duration of Eliquis, he would need to meet out of pocket spend requirement to qualify for assistance in 2022.   BPH: . Controlled; current regimen: tamsulosin 0.4 mg daily . Recommend to continue current regimen at this time  GERD: . Controlled; current regimen: pantoprazole 40 mg daily . Continue current regimen at this time  Patient Goals/Self-Care Activities . Over the next 90 days, patient will:  - take medications as prescribed check glucose daily, document, and provide at future appointments check blood pressure daily, document, and provide at future appointments collaborate with provider on medication access solutions  Follow Up Plan: Telephone follow up appointment with care management team member scheduled for: ~ 8 weeks       The patient verbalized understanding of instructions, educational materials, and care plan provided today and declined offer to receive copy of patient instructions, educational materials, and care plan.    Plan: Telephone follow up appointment with care management team member scheduled for:~ 8 weeks  [name], PharmD, [place], [place] Clinical Pharmacist [employer] [facility] [employer] [phone]

## 2019-12-29 NOTE — Chronic Care Management (AMB) (Addendum)
Chronic Care Management   Pharmacy Note  12/29/2019 Name: [name] [name] MRN: [id number] DOB: 1940-12-18  Subjective:  [name] [name] is a 79 y.o. year old male who is a primary care patient of [name], [name]. The CCM team was consulted for assistance with chronic disease management and care coordination needs.    Engaged with patient by telephone in response to his call for follow up visit in response to provider referral for pharmacy case management and/or care coordination services.   Consent to Services:  Mr. Hoglund was given information about Chronic Care Management services, agreed to services, and gave verbal consent prior to initiation of services on 10/2018. Please see initial visit note for detailed documentation.   Objective:  Lab Results  Component Value Date   CREATININE 0.88 12/21/2019   CREATININE 0.64 07/09/2019   CREATININE 0.64 07/08/2019    Lab Results  Component Value Date   HGBA1C 7.1 (A) 12/22/2019       Component Value Date/Time   CHOL 124 12/21/2019 0919   TRIG 267 (H) 12/21/2019 0919   HDL 36 (L) 12/21/2019 0919   CHOLHDL 3.4 12/21/2019 0919   VLDL 53 (H) 12/21/2019 0919   LDLCALC 35 12/21/2019 0919   LDLDIRECT 55.0 02/10/2019 0903     BP Readings from Last 3 Encounters:  12/28/19 102/64  12/22/19 106/68  12/07/19 132/74    Assessment/Interventions: Review of patient past medical history, allergies, medications, health status, including review of consultants reports, laboratory and other test data, was performed as part of comprehensive evaluation and provision of chronic care management services.   SDOH (Social Determinants of Health) assessments and interventions performed:    CCM Care Plan  Allergies  Allergen Reactions  . Metformin And Related Diarrhea and Other (See Comments)    Leg cramps, also  . Azithromycin Other (See Comments)    Not recommended - no reaction  . Other Other (See Comments)    If taking  antibiotics for awhile, thrush results  . Percocet [Oxycodone-Acetaminophen] Other (See Comments)    Hallucinations  . Jardiance [Empagliflozin] Other (See Comments)    Tongue itching/reaction    Medications Reviewed Today    Reviewed by De Hollingshead, RPH-CPP (Pharmacist) on 12/29/19 at 1540  Med List Status: <None>  Medication Order Taking? Sig Documenting Provider Last Dose Status Informant  acetaminophen (TYLENOL) 650 MG CR tablet [id number] Yes Take 650 mg by mouth 2 (two) times daily as needed (for pain).  [provider] Taking Active Self  apixaban (ELIQUIS) 5 MG TABS tablet [id number] Yes Take 1 tablet (5 mg total) by mouth 2 (two) times daily. Vickie Epley, MD Taking Active   carvedilol (COREG) 6.25 MG tablet [id number] Yes Take 1 tablet (6.25 mg total) by mouth 2 (two) times daily. Vickie Epley, MD Taking Active   clopidogrel (PLAVIX) 75 MG tablet [id number] Yes Take 1 tablet (75 mg total) by mouth daily with breakfast. Tommie Raymond, NP Taking Active   ezetimibe (ZETIA) 10 MG tablet [id number] Yes TAKE 1 TABLET(10 MG) BY MOUTH DAILY [name], [name], MD Taking Active   fluticasone (FLONASE) 50 MCG/ACT nasal spray [id number] Yes Place 2 sprays into both nostrils daily. Coral Spikes, DO Taking Active Self  glipiZIDE (GLUCOTROL) 10 MG tablet [id number] Yes Take 10 mg by mouth daily before breakfast. [provider] Taking Active   hydrochlorothiazide (HYDRODIURIL) 25 MG tablet [id number] Yes TAKE 1 TABLET(25 MG) BY MOUTH DAILY [name], [name], [name] Taking Active  hydrocortisone cream 1 % [id number] Yes Apply 1 application topically See admin instructions. Apply to affected area/rash every 3 days [provider] Taking Active Self  ipratropium (ATROVENT) 0.06 % nasal spray [id number] Yes Place 2 sprays into both nostrils 3 (three) times daily. McLean-Scocuzza, Nino Glow, MD Taking Active Self  lisinopril (ZESTRIL) 10 MG tablet [id number] Yes TAKE 1  TABLET BY MOUTH DAILY [name], [name] Taking Active   Multiple Vitamin (MULTIVITAMIN) capsule [id number] Yes Take 1 capsule by mouth daily with lunch.  [provider] Taking Active Self  nitroGLYCERIN (NITROSTAT) 0.4 MG SL tablet [id number] Yes Place 1 tablet (0.4 mg total) under the tongue every 5 (five) minutes x 3 doses as needed for chest pain. [name], NP Taking Active   Omega-3 Fatty Acids (FISH OIL PO) [id number] Yes Take 1 capsule by mouth daily at 12 noon.  [provider] Taking Active Self           Med Note Mayo Ao Jul 13, 2018  4:06 PM)    [facility] VERIO test strip [id number] Yes 2 (two) times daily. [provider] Taking Active   OZEMPIC, 1 MG/DOSE, 4 MG/3ML SOPN [id number] Yes ADMINISTER 1 MG UNDER THE SKIN 1 TIME A WEEK [name], [name], [name] Taking Active   pantoprazole (PROTONIX) 40 MG tablet [id number] Yes Take 1 tablet (40 mg total) by mouth daily before breakfast. [name], MD Taking Active   Polyvinyl Alcohol-Povidone (MURINE TEARS FOR DRY EYES OP) [id number] Yes Place 1-2 drops into both eyes as needed (for dry eyes).  [provider] Taking Active Self  psyllium (METAMUCIL) 58.6 % powder [id number] Yes Take 1 packet by mouth at bedtime as needed (if no roughage is consumed that day- Mix and drink).  [provider] Taking Active Self           Med Note Duffy [name], [name]   Wed Jul 07, 2019  6:13 PM)    rosuvastatin (CRESTOR) 20 MG tablet [id number] Yes Take 1 tablet (20 mg total) by mouth daily. Vickie Epley, MD Taking Active   sodium chloride (OCEAN) 0.65 % SOLN nasal spray [id number] Yes Place 1 spray into both nostrils as needed for congestion. [provider] Taking Active Self  spironolactone (ALDACTONE) 25 MG tablet [id number] Yes Take 1 tablet (25 mg total) by mouth daily. [name], MD Taking Active   tamsulosin (FLOMAX) 0.4 MG CAPS capsule [id number] Yes TAKE 1 CAPSULE(0.4  MG) BY MOUTH DAILY [name], MD Taking Active   traMADol (ULTRAM) 50 MG tablet [id number] Yes TAKE 1 TABLET BY MOUTH EVERY 12 HOURS AS NEEDED [name], [name] Taking Active           Patient Active Problem List   Diagnosis Date Noted  . Ischemic cardiomyopathy   . VT (ventricular tachycardia) ([place]) 07/09/2019  . NSTEMI (non-ST elevated myocardial infarction) ([place]) 07/07/2019  . De Quervain's tenosynovitis, left 05/17/2019  . Esophagitis, unspecified without bleeding   . Esophageal ulcer 11/20/2018  . Acute left flank pain 10/23/2018  . Post-nasal drip 10/23/2018  . Allergic rhinitis 10/23/2018  . Right shoulder pain 07/03/2018  . Skin lesion 06/12/2018  . Pruritus 06/12/2018  . Kidney stones, calcium oxalate 05/28/2018  . Atherosclerosis of native coronary artery with stable angina pectoris ([place]) 05/17/2018  . GERD (gastroesophageal reflux disease) 02/23/2018  . SCC (squamous cell carcinoma) 05/15/2017  . Cervical stenosis of spine 05/15/2017  . Tobacco abuse  01/26/2017  . Cardiac murmur 01/08/2017  . Right renal mass 10/31/2016  . Abdominal pain 10/31/2016  . Fatty liver 10/31/2016  . Hematuria 10/31/2016  . Acute non-recurrent maxillary sinusitis 10/17/2015  . Carotid artery disease ([place]) 10/03/2015  . Atherosclerosis of abdominal aorta ([place]) 07/28/2015  . CAD (coronary artery disease) 07/10/2015  . Essential hypertension 07/10/2015  . Mixed hyperlipidemia 07/10/2015  . Controlled type 2 diabetes mellitus with complication, without long-term current use of insulin ([place]) 07/10/2015  . History of melanoma 07/10/2015  . Chronic low back pain 07/10/2015  . BPH (benign prostatic hyperplasia) 07/10/2015  . Bilateral hand numbness 07/10/2015  . Insomnia 07/10/2015    Conditions to be addressed/monitored per PCP order: CAD, HTN, HLD and DMII  Patient Care Plan: Medication Management    Problem Identified: Diabetes, CAD, HTN, HLD, AFib     Long-Range Goal:  Disease Progression Prevention   This Visit's Progress: On track  Priority: High  Note:   Current Barriers:  . Unable to independently afford treatment regimen . Unable to achieve control of diabetes   Pharmacist Clinical Goal(s):  [name] Over the next 90 days, patient will verbalize ability to afford treatment regimen . Over the next 90 days, patient will maintain control of diabetes as evidenced by A1c  through collaboration with PharmD and provider.   Interventions: . 1:1 collaboration with [name], [name] regarding development and update of comprehensive plan of care as evidenced by provider attestation and co-signature . Inter-disciplinary care team collaboration (see longitudinal plan of care) . Comprehensive medication review performed; medication list updated in electronic medical record  Diabetes: . Uncontrolled; current treatment: Ozempic 1 mg weekly, glipizide 10 mg QAM  o Has been unable to tolerate metformin, IR and ER, d/t diarrhea.  o Did not tolerate Jardiance d/t urinary symptoms . Reports more stress over the past few weeks with cardiology concerns.  . Denies episodes of hypoglycemia . Current glucose readings: fastings: 130-140s; 2 hour post prandials: 140-160s . Recommend to continue current regimen at this time. Due to reapply for assistance to [employer]. Will collaborate w/ CPhT to mail patient her portion of application, and will collaborate w/ PCP for prescriber portion.  . Patient may run out of Novo supply before 2022 supply arrives. Recommend PCP send 1 month supply of Ozempic to local pharmacy to have on hold in case patient needs to fill. He is amenable to this plan.  Hypertension, Ventricular Ectopy, s/p . Controlled; current treatment: carvedilol 6.25 mg BID, spironolactone 12.5 mg QAM, lisinopril 10 mg QAM, HCTZ 25 mg daily. Primary cardiologist Ignacia Bayley, NP; EP Dr. Quentin Ore. Upcoming ICD placement.  . Recommend to continue current regimen with  cardiology collaboration at this time.   Hyperlipidemia: . Controlled; current treatment: rosuvastatin 20 mg daily, ezetimibe 10 mg daily   . Recommend to continue current regimen at this time  Thomboembolic Event (s/p LV mural thrombus): [name] Appropriately managed; Eliquis 5 mg BID . Antiplatelet regimen: clopidogrel 75 mg daily (reports aspirin was d/c) . Recommend to continue current regimen at this time. Reviewed that pending duration of Eliquis, he would need to meet out of pocket spend requirement to qualify for assistance in 2022.   BPH: . Controlled; current regimen: tamsulosin 0.4 mg daily . Recommend to continue current regimen at this time  GERD: . Controlled; current regimen: pantoprazole 40 mg daily . Continue current regimen at this time  Patient Goals/Self-Care Activities . Over the next 90 days, patient will:  -  take medications as prescribed check glucose daily, document, and provide at future appointments check blood pressure daily, document, and provide at future appointments collaborate with provider on medication access solutions  Follow Up Plan: Telephone follow up appointment with care management team member scheduled for: ~ 8 weeks      Medication Assistance: Application for Novo (Ozempic) medication assistance program in process. Anticipated assistance start date TBD. See plan of care above for additional detail.   Plan: Telephone follow up appointment with care management team member scheduled for:~ 8 weeks  [name], PharmD, [place], CPP Clinical Pharmacist [facility]   I have collaborated with the care management provider regarding care management and care coordination activities outlined in this encounter and have reviewed this encounter including documentation in the note and care plan. I am certifying that I agree with the content of this note and encounter as primary care provider.   [name],  NP

## 2019-12-31 ENCOUNTER — Other Ambulatory Visit: Admission: RE | Admit: 2019-12-31 | Payer: Medicare Other | Source: Ambulatory Visit

## 2020-01-03 ENCOUNTER — Ambulatory Visit (HOSPITAL_COMMUNITY): Admission: RE | Admit: 2020-01-03 | Payer: Medicare Other | Source: Home / Self Care | Admitting: Cardiology

## 2020-01-03 ENCOUNTER — Encounter (HOSPITAL_COMMUNITY): Admission: RE | Payer: Medicare Other | Source: Home / Self Care

## 2020-01-03 ENCOUNTER — Telehealth: Payer: Self-pay

## 2020-01-03 ENCOUNTER — Other Ambulatory Visit: Payer: Self-pay | Admitting: Family

## 2020-01-03 DIAGNOSIS — E1165 Type 2 diabetes mellitus with hyperglycemia: Secondary | ICD-10-CM

## 2020-01-03 DIAGNOSIS — I255 Ischemic cardiomyopathy: Secondary | ICD-10-CM

## 2020-01-03 SURGERY — ICD IMPLANT

## 2020-01-03 MED ORDER — OZEMPIC (1 MG/DOSE) 4 MG/3ML ~~LOC~~ SOPN: PEN_INJECTOR | SUBCUTANEOUS | 1 refills | Status: DC

## 2020-01-03 NOTE — Telephone Encounter (Signed)
Outreach made to Pt.  Pt scheduled for ICD implant at [facility] for February 02 2020 at 7:30 am.  Pt labs/covid test scheduled.  Unable to contact [facility] cath lab to schedule procedure.  Left message requesting call back.  Schedule procedure

## 2020-01-04 ENCOUNTER — Telehealth: Payer: Medicare Other

## 2020-01-04 ENCOUNTER — Other Ambulatory Visit: Payer: Self-pay | Admitting: Cardiology

## 2020-01-04 NOTE — Telephone Encounter (Signed)
Procedure scheduled.  Work up complete 

## 2020-01-10 ENCOUNTER — Other Ambulatory Visit: Payer: Self-pay | Admitting: Family

## 2020-01-10 DIAGNOSIS — G8929 Other chronic pain: Secondary | ICD-10-CM

## 2020-01-11 ENCOUNTER — Other Ambulatory Visit: Payer: Self-pay | Admitting: Family

## 2020-01-13 ENCOUNTER — Ambulatory Visit: Payer: Medicare Other

## 2020-01-27 ENCOUNTER — Telehealth: Payer: Self-pay | Admitting: Cardiology

## 2020-01-27 NOTE — Telephone Encounter (Signed)
Patient would like to discuss the possibility, due to forthcoming inclement weather, in getting his labwork and COVID test for his upcoming procedure. . Please call.

## 2020-01-27 NOTE — Telephone Encounter (Signed)
Spoke to pt. Notified that if inclement weather permits driving on Monday 9/92 when Covid test and pre procedure labs are scheduled, then he may call our office Monday morning and we can reschedule to have Covid test for Tuesday 1/18.   He is scheduled to have ICD implant with Dr. Quentin Ore at [facility] Wednesday 1/19. Advised pt that even if our office is closed due to weather, we should be available to answer phones while working remotely and will be able to help him reschedule if needed. Lab orders are placed and no appointment needed so pt aware he would just have labs done prior to Covid test.   Pt verbalized understanding, appreciative of information. No further questions at this time.

## 2020-01-31 ENCOUNTER — Other Ambulatory Visit
Admission: RE | Admit: 2020-01-31 | Discharge: 2020-01-31 | Disposition: A | Discharge status: Home / Self Care | Payer: Medicare Other | Source: Ambulatory Visit | Attending: Cardiology | Admitting: Cardiology

## 2020-01-31 ENCOUNTER — Other Ambulatory Visit: Payer: Self-pay

## 2020-01-31 ENCOUNTER — Telehealth: Payer: Self-pay | Admitting: Cardiology

## 2020-01-31 DIAGNOSIS — I255 Ischemic cardiomyopathy: Secondary | ICD-10-CM | POA: Insufficient documentation

## 2020-01-31 DIAGNOSIS — Z20822 Contact with and (suspected) exposure to covid-19: Secondary | ICD-10-CM | POA: Diagnosis not present

## 2020-01-31 DIAGNOSIS — Z01812 Encounter for preprocedural laboratory examination: Secondary | ICD-10-CM | POA: Insufficient documentation

## 2020-01-31 LAB — CBC WITH DIFFERENTIAL/PLATELET
Abs Immature Granulocytes: 0.02 10*3/uL (ref 0.00–0.07)
Basophils Absolute: 0 10*3/uL (ref 0.0–0.1)
Basophils Relative: 1 %
Eosinophils Absolute: 0.2 10*3/uL (ref 0.0–0.5)
Eosinophils Relative: 2 %
HCT: 40.8 % (ref 39.0–52.0)
Hemoglobin: 14.3 g/dL (ref 13.0–17.0)
Immature Granulocytes: 0 %
Lymphocytes Relative: 31 %
Lymphs Abs: 2.2 10*3/uL (ref 0.7–4.0)
MCH: 30.6 pg (ref 26.0–34.0)
MCHC: 35 g/dL (ref 30.0–36.0)
MCV: 87.4 fL (ref 80.0–100.0)
Monocytes Absolute: 0.6 10*3/uL (ref 0.1–1.0)
Monocytes Relative: 8 %
Neutro Abs: 4.1 10*3/uL (ref 1.7–7.7)
Neutrophils Relative %: 58 %
Platelets: 162 10*3/uL (ref 150–400)
RBC: 4.67 MIL/uL (ref 4.22–5.81)
RDW: 14 % (ref 11.5–15.5)
WBC: 7.1 10*3/uL (ref 4.0–10.5)
nRBC: 0 % (ref 0.0–0.2)

## 2020-01-31 LAB — BASIC METABOLIC PANEL
Anion gap: 12 (ref 5–15)
BUN: 18 mg/dL (ref 8–23)
CO2: 26 mmol/L (ref 22–32)
Calcium: 9.3 mg/dL (ref 8.9–10.3)
Chloride: 102 mmol/L (ref 98–111)
Creatinine, Ser: 0.8 mg/dL (ref 0.61–1.24)
GFR, Estimated: >60 mL/min (ref >60)
Glucose, Bld: 181 mg/dL — ABNORMAL HIGH (ref 70–99)
Potassium: 3.9 mmol/L (ref 3.5–5.1)
Sodium: 140 mmol/L (ref 135–145)

## 2020-01-31 NOTE — Telephone Encounter (Signed)
I called and left a message for PAT at [facility] to please reschedule the patient's COVID swab for tomorrow 02/01/20 due to the patient being unable to get there today due to road conditions.   I have called and spoken with Mrs. Congrove and advised hef to please have the patient come to the [facility] area tomorrow before 11:00 am for his COVID test to be done.  Per Mrs. Waymire, the patient will need to have his pre procedure lab work as well.  I have asked her to please have the patient go to the [facility] first to have his labs done tomorrow, then drive over to the [facility] Entrance for his COVID swab.   Mrs. Nazar voices understanding and is agreeable.

## 2020-01-31 NOTE — Telephone Encounter (Signed)
Patient needs to re-schedule his COVID Test for today. He is not able to make it in due to the weather. If he is not at home please call his Wife's cell phone [phone]

## 2020-02-01 LAB — SARS CORONAVIRUS 2 (TAT 6-24 HRS): SARS Coronavirus 2: NEGATIVE

## 2020-02-02 ENCOUNTER — Encounter
Admission: RE | Disposition: A | Discharge status: Home / Self Care | Payer: Medicare Other | Source: Home / Self Care | Attending: Cardiology

## 2020-02-02 ENCOUNTER — Encounter: Payer: Self-pay | Admitting: Cardiology

## 2020-02-02 ENCOUNTER — Ambulatory Visit
Admission: RE | Admit: 2020-02-02 | Discharge: 2020-02-02 | Disposition: A | Discharge status: Home / Self Care | Payer: Medicare Other | Attending: Cardiology | Admitting: Cardiology

## 2020-02-02 ENCOUNTER — Ambulatory Visit: Payer: Medicare Other

## 2020-02-02 ENCOUNTER — Other Ambulatory Visit: Payer: Self-pay

## 2020-02-02 DIAGNOSIS — Z8249 Family history of ischemic heart disease and other diseases of the circulatory system: Secondary | ICD-10-CM | POA: Diagnosis not present

## 2020-02-02 DIAGNOSIS — I472 Ventricular tachycardia, unspecified: Secondary | ICD-10-CM

## 2020-02-02 DIAGNOSIS — I429 Cardiomyopathy, unspecified: Secondary | ICD-10-CM | POA: Diagnosis not present

## 2020-02-02 DIAGNOSIS — Z7902 Long term (current) use of antithrombotics/antiplatelets: Secondary | ICD-10-CM | POA: Insufficient documentation

## 2020-02-02 DIAGNOSIS — Z7982 Long term (current) use of aspirin: Secondary | ICD-10-CM | POA: Insufficient documentation

## 2020-02-02 DIAGNOSIS — Z881 Allergy status to other antibiotic agents status: Secondary | ICD-10-CM | POA: Diagnosis not present

## 2020-02-02 DIAGNOSIS — Z7984 Long term (current) use of oral hypoglycemic drugs: Secondary | ICD-10-CM | POA: Diagnosis not present

## 2020-02-02 DIAGNOSIS — Z79899 Other long term (current) drug therapy: Secondary | ICD-10-CM | POA: Diagnosis not present

## 2020-02-02 DIAGNOSIS — Z95810 Presence of automatic (implantable) cardiac defibrillator: Secondary | ICD-10-CM | POA: Diagnosis not present

## 2020-02-02 DIAGNOSIS — Z87891 Personal history of nicotine dependence: Secondary | ICD-10-CM | POA: Diagnosis not present

## 2020-02-02 DIAGNOSIS — I110 Hypertensive heart disease with heart failure: Secondary | ICD-10-CM | POA: Insufficient documentation

## 2020-02-02 DIAGNOSIS — I214 Non-ST elevation (NSTEMI) myocardial infarction: Secondary | ICD-10-CM | POA: Insufficient documentation

## 2020-02-02 DIAGNOSIS — Z7901 Long term (current) use of anticoagulants: Secondary | ICD-10-CM | POA: Insufficient documentation

## 2020-02-02 DIAGNOSIS — I2510 Atherosclerotic heart disease of native coronary artery without angina pectoris: Secondary | ICD-10-CM | POA: Diagnosis present

## 2020-02-02 DIAGNOSIS — I255 Ischemic cardiomyopathy: Secondary | ICD-10-CM | POA: Diagnosis not present

## 2020-02-02 DIAGNOSIS — Z885 Allergy status to narcotic agent status: Secondary | ICD-10-CM | POA: Diagnosis not present

## 2020-02-02 DIAGNOSIS — I513 Intracardiac thrombosis, not elsewhere classified: Secondary | ICD-10-CM

## 2020-02-02 DIAGNOSIS — I5022 Chronic systolic (congestive) heart failure: Secondary | ICD-10-CM

## 2020-02-02 HISTORY — PX: ICD IMPLANT: EP1208

## 2020-02-02 LAB — GLUCOSE, CAPILLARY: Glucose-Capillary: 174 mg/dL — ABNORMAL HIGH (ref 70–99)

## 2020-02-02 SURGERY — ICD IMPLANT
Anesthesia: Moderate Sedation

## 2020-02-02 MED ORDER — CEFAZOLIN SODIUM-DEXTROSE 2-4 GM/100ML-% IV SOLN
2.0000 g | INTRAVENOUS | Status: AC
  Administered 2020-02-02: 2 g via INTRAVENOUS

## 2020-02-02 MED ORDER — HEPARIN (PORCINE) IN NACL 1000-0.9 UT/500ML-% IV SOLN
INTRAVENOUS | Status: DC | PRN
  Administered 2020-02-02: 500 mL

## 2020-02-02 MED ORDER — CHLORHEXIDINE GLUCONATE 4 % EX LIQD: 4.0000 "application " | Freq: Once | CUTANEOUS | Status: DC

## 2020-02-02 MED ORDER — ONDANSETRON HCL 4 MG/2ML IJ SOLN: 4.0000 mg | Freq: Four times a day (QID) | INTRAMUSCULAR | Status: DC | PRN

## 2020-02-02 MED ORDER — LIDOCAINE HCL (PF) 1 % IJ SOLN
INTRAMUSCULAR | Status: AC
  Filled 2020-02-02: qty 30

## 2020-02-02 MED ORDER — FENTANYL CITRATE (PF) 100 MCG/2ML IJ SOLN
INTRAMUSCULAR | Status: AC
  Filled 2020-02-02: qty 2

## 2020-02-02 MED ORDER — POVIDONE-IODINE 10 % EX SWAB: 2.0000 "application " | Freq: Once | CUTANEOUS | Status: DC

## 2020-02-02 MED ORDER — MIDAZOLAM HCL 2 MG/2ML IJ SOLN
INTRAMUSCULAR | Status: DC | PRN
  Administered 2020-02-02 (×2): 1 mg via INTRAVENOUS

## 2020-02-02 MED ORDER — CHLORHEXIDINE GLUCONATE CLOTH 2 % EX PADS
6.0000 | MEDICATED_PAD | Freq: Every day | CUTANEOUS | Status: DC
  Administered 2020-02-02: 6 via TOPICAL

## 2020-02-02 MED ORDER — CLOPIDOGREL BISULFATE 75 MG PO TABS
ORAL_TABLET | ORAL | Status: AC
  Filled 2020-02-02: qty 1

## 2020-02-02 MED ORDER — MIDAZOLAM HCL 2 MG/2ML IJ SOLN
INTRAMUSCULAR | Status: AC
  Filled 2020-02-02: qty 2

## 2020-02-02 MED ORDER — SODIUM CHLORIDE 0.9 % IV SOLN
80.0000 mg | INTRAVENOUS | Status: DC
  Filled 2020-02-02 (×2): qty 2

## 2020-02-02 MED ORDER — CLOPIDOGREL BISULFATE 75 MG PO TABS
75.0000 mg | ORAL_TABLET | Freq: Every day | ORAL | Status: DC
  Administered 2020-02-02: 75 mg via ORAL

## 2020-02-02 MED ORDER — LIDOCAINE HCL (PF) 1 % IJ SOLN
INTRAMUSCULAR | Status: DC | PRN
  Administered 2020-02-02: 40 mL

## 2020-02-02 MED ORDER — SODIUM CHLORIDE 0.9 % IV SOLN: INTRAVENOUS | Status: DC

## 2020-02-02 MED ORDER — FENTANYL CITRATE (PF) 100 MCG/2ML IJ SOLN
INTRAMUSCULAR | Status: DC | PRN
  Administered 2020-02-02 (×2): 25 ug via INTRAVENOUS

## 2020-02-02 MED ORDER — ACETAMINOPHEN 325 MG PO TABS: 325.0000 mg | ORAL_TABLET | ORAL | Status: DC | PRN

## 2020-02-02 MED ORDER — APIXABAN 5 MG PO TABS: 5.0000 mg | ORAL_TABLET | Freq: Two times a day (BID) | ORAL | 3 refills | Status: DC

## 2020-02-02 MED ORDER — HEPARIN (PORCINE) IN NACL 1000-0.9 UT/500ML-% IV SOLN
INTRAVENOUS | Status: AC
  Filled 2020-02-02: qty 1000

## 2020-02-02 SURGICAL SUPPLY — 14 items
CABLE SURG 12 DISP A/V CHANNEL (MISCELLANEOUS) ×2 IMPLANT
DEVICE DSSCT PLSMBLD 3.0S LGHT (MISCELLANEOUS) ×1 IMPLANT
GENERATOR RESONATE D432 (ICD Generator) ×2 IMPLANT
LEAD RELIANCE 0673 ×2 IMPLANT
PACK PACE INSERTION (MISCELLANEOUS) ×2 IMPLANT
PAD ELECT DEFIB RADIOL ZOLL (MISCELLANEOUS) ×2 IMPLANT
PLASMABLADE 3.0S W/LIGHT (MISCELLANEOUS) ×2
SHEATH 8FR PRELUDE SNAP 13 (SHEATH) ×2 IMPLANT
SLING ARM IMMOBILIZER LRG (SOFTGOODS) ×2 IMPLANT
SUT ETHIBOND CT1 BRD #0 30IN (SUTURE) ×2 IMPLANT
SUT VIC AB 2-0 CT1 27 (SUTURE) ×1
SUT VIC AB 2-0 CT1 TAPERPNT 27 (SUTURE) ×1 IMPLANT
SUT VIC AB 3-0 CT1 27 (SUTURE) ×1
SUT VIC AB 3-0 CT1 TAPERPNT 27 (SUTURE) ×1 IMPLANT

## 2020-02-02 NOTE — Discharge Summary (Signed)
Discharge Summary    Patient ID: [name] [name] MRN: [id number]; DOB: May 11, 1940  Admit date: 02/02/2020 Discharge date: 02/02/2020  Primary Care Provider: [name], [name]  Primary Cardiologist: Ida Rogue, MD  Primary Electrophysiologist:  Vickie Epley, MD   Discharge Diagnoses    Principal Problem:   Ischemic cardiomyopathy Active Problems:   CAD (coronary artery disease)   VT (ventricular tachycardia) ([place])   Chronic systolic heart failure ([place])   Mural thrombus of left ventricle   ICD (implantable cardioverter-defibrillator) in place   Diagnostic Studies/Procedures    EP PPM/ICD implant 02/02/19 Technical Details SURGEON:  Lars Mage, MD      PREPROCEDURE DIAGNOSES:   1. Ischemic cardiomyopathy.   2. New York Heart Association class II, heart failure chronically.  3. Sustained ventricular tachcyardia     POSTPROCEDURE DIAGNOSES:   1. Ischemic cardiomyopathy.   2. New York Heart Association class II heart failure chronically.  3. Sustained ventricular tachycardia     PROCEDURES:    1. ICD implantation.     INTRODUCTION: [name] [name] is a 80 y.o. male with an ischemic CM (EF35%), NYHA Class II CHF and sustained monomorphic ventricular tachycardia. At this time, he meets criteria for ICD implantation for secondary prevention of sudden death.  The patient has been treated with an optimal medical regimen but continues to have a depressed ejection fraction and NYHA Class II CHF symptoms.  The patient therefore  presents today for ICD implantation.      DESCRIPTION OF PROCEDURE:  Informed written consent was obtained and the patient was brought to the electrophysiology lab in the fasting state. The patient was adequately sedated with intravenous Versed, and fentanyl as outlined in the nursing report.  The patient's left chest was prepped and draped in the usual sterile fashion by the EP lab staff.  The skin overlying the left deltopectoral region  was infiltrated with lidocaine for local analgesia.  An incision was created over the left deltopectoral region.  A left subcutaneous defibrillator pocket was fashioned using a combination of sharp and blunt dissection.  Electrocautery was used to assure hemostasis.   Lead Placement: The left axillary vein was cannulated using ultrasound guidance.  Through the left axillary vein, a RV single coil ICD lead was advanced to the RV mid septum (model [employer] 4-Front [id number], serial [id number]) where it displayed excelling pacing (0.7 V at 0.25ms) and sensing thresholds (9.3 mV) with an acceptable impedance (947 ohms). The lead was secured to the pectoral fascia. The pocket was irrigated with copious vancomycin solution.  The leads were then  connected to a pulse generator (model Resonate EL ICD VR [id number], serial [id number]). The pocket was closed in layers of absorbable suture. EBL < 35mL. Steri-strips and a sterile dressing were applied.  Due to his history of an LV thrombus, DFT testing was deferred.  During this procedure the patient is administered a total of Versed 2 mg and Fentanyl 50 mg to achieve and maintain moderate conscious sedation.  The patient's heart rate, blood pressure, and oxygen saturation are monitored continuously during the procedure. The period of conscious sedation is 48 minutes, of which I was present face-to-face 100% of this time.      CONCLUSIONS:   1. Ischemic cardiomyopathy with chronic New York Heart Association class II heart failure.  2. Sustained monomorphic ventricular tachycardia 3. Successful ICD implantation with a [employer] VVI single coil ICD implanted for secondary prevention of sudden death.  4.  No early apparent complications.  5. Plan to restart Aspirin and Plavix today. Will restart Apixaban on [place], February 06, 2020.   [name] Galas T. Quentin Ore, MD, [facility] Cardiac Electrophysiology  Estimated blood loss <50 mL.   During this procedure medications were  administered to achieve and maintain moderate conscious sedation while the patient's heart rate, blood pressure, and oxygen saturation were continuously monitored and I was present face-to-face 100% of this time.    _____________   History of Present Illness     [name] [name] is a 80 y.o. male with pmh of CAD with prior stenting, DM2, HTN, HLD, melanoma, abdominal aorta atherosclerosis who was admitted in June 2021 for NSTEMI, presenting with VT that ultimately broke to sinus bradycardia. Cardiac catheterization showed chronically occluded RCA with collaterals with left to right, severe pLAD stenosis treated with stent, 75% lesion in diag treated with angioplasty, patent OM stent. EP saw the patient. She had a heart monitor that showed NSVT with 10% PVC burden. Cardiac MRI showed significant scar burden and a left mural thrombus in the left ventricle. The patient was started on Eliquis for 6 weeks. Patient also had a repeat echo showing stable EF around 30-35%. Due to the persistently low EF and significant scar on cardiac MRI the patient was scheduled to under ICD implantation.   Hospital Course     Consultants: None  The patient arrived 02/02/20 for scheduled procedure. The patient underwent successful ICD implantation with [facility] scientific VVI single coil ICD for prevention of sudden cardiac death. Due to history of LV thrombus DFT testing was deferred. Patient tolerated the procedure will and there were no immediate complications. Plan to restart Aspirin and plavix today. He received 1 dose of plavix post-procedure. Plan to restart Eliquis on Sunday February 06, 2020. Follow-up has been scheduled. Follow-up CXR reviewed by MD and device check performed.   The patient was evaluated by [name] and felt to be stable for discharge.   Did the patient have an acute coronary syndrome (MI, NSTEMI, STEMI, etc) this admission?:  No                               Did the patient have a percutaneous  coronary intervention (stent / angioplasty)?:  No.       _____________  Discharge Vitals Blood pressure 124/78, pulse 60, temperature 98.4 F (36.9 C), temperature source Oral, resp. rate 16, height 6\' 1"  (1.854 m), weight 89.8 kg, SpO2 96 %.  Filed Weights   02/02/20 0720  Weight: 89.8 kg    Labs & Radiologic Studies    CBC Recent Labs    01/31/20 1339  WBC 7.1  NEUTROABS 4.1  HGB 14.3  HCT 40.8  MCV 87.4  PLT 948   Basic Metabolic Panel Recent Labs    01/31/20 1339  NA 140  K 3.9  CL 102  CO2 26  GLUCOSE 181*  BUN 18  CREATININE 0.80  CALCIUM 9.3   Liver Function Tests No results for input(s): AST, ALT, ALKPHOS, BILITOT, PROT, ALBUMIN in the last 72 hours. No results for input(s): LIPASE, AMYLASE in the last 72 hours. High Sensitivity Troponin:   No results for input(s): TROPONINIHS in the last 720 hours.  BNP Invalid input(s): POCBNP D-Dimer No results for input(s): DDIMER in the last 72 hours. Hemoglobin A1C No results for input(s): HGBA1C in the last 72 hours. Fasting Lipid Panel No results for input(s): CHOL, HDL, LDLCALC, TRIG, CHOLHDL, LDLDIRECT in  the last 72 hours. Thyroid Function Tests No results for input(s): TSH, T4TOTAL, T3FREE, THYROIDAB in the last 72 hours.  Invalid input(s): FREET3 _____________  No results found. Disposition   Pt is being discharged home today in good condition.  Follow-up Plans & Appointments     Follow-up Information    Vickie Epley, MD Follow up on 02/15/2020.   Specialties: Cardiology, Radiology Why: wound check Contact information: [address] [place] [address] [phone]        [name], MD Follow up on 03/10/2020.   Specialty: Cardiology Contact information: [place] [address] [phone]                Discharge Medications   Allergies as of 02/02/2020      Reactions   Metformin And Related Diarrhea, Other (See  Comments)   Leg cramps, also   Azithromycin Other (See Comments)   Not recommended - no reaction   Other Other (See Comments)   If taking antibiotics for awhile, thrush results   Percocet [oxycodone-acetaminophen] Other (See Comments)   Hallucinations   Jardiance [empagliflozin] Other (See Comments)   Tongue itching/reaction      Medication List    TAKE these medications   acetaminophen 500 MG tablet Commonly known as: TYLENOL Take 1,000 mg by mouth every 6 (six) hours as needed (pain.).   apixaban 5 MG Tabs tablet Commonly known as: ELIQUIS Take 1 tablet (5 mg total) by mouth 2 (two) times daily. Restart Sunday February 06, 2020 What changed: additional instructions   carvedilol 6.25 MG tablet Commonly known as: COREG Take 1 tablet (6.25 mg total) by mouth 2 (two) times daily.   clopidogrel 75 MG tablet Commonly known as: PLAVIX TAKE 1 TABLET(75 MG TOTAL) BY MOUTH DAILY WITH BREAKFAST What changed: See the new instructions.   ezetimibe 10 MG tablet Commonly known as: ZETIA TAKE 1 TABLET(10 MG) BY MOUTH DAILY What changed: See the new instructions.   Fish Oil 1000 MG Caps Take 1,000 mg by mouth daily with lunch.   fluticasone 50 MCG/ACT nasal spray Commonly known as: FLONASE Place 2 sprays into both nostrils daily. What changed:   when to take this  reasons to take this   hydrochlorothiazide 25 MG tablet Commonly known as: HYDRODIURIL TAKE 1 TABLET(25 MG) BY MOUTH DAILY What changed: See the new instructions.   lisinopril 10 MG tablet Commonly known as: ZESTRIL TAKE 1 TABLET BY MOUTH DAILY What changed: when to take this   multivitamin with minerals Tabs tablet Take 1 tablet by mouth daily with lunch.   MURINE TEARS FOR DRY EYES OP Place 1-2 drops into both eyes as needed (for dry eyes).   nitroGLYCERIN 0.4 MG SL tablet Commonly known as: NITROSTAT Place 1 tablet (0.4 mg total) under the tongue every 5 (five) minutes x 3 doses as needed for chest  pain.   OneTouch Verio test strip Generic drug: glucose blood 2 (two) times daily.   Ozempic (1 MG/DOSE) 4 MG/3ML Sopn Generic drug: Semaglutide (1 MG/DOSE) ADMINISTER 1 MG UNDER THE SKIN 1 TIME A WEEK What changed:   how much to take  how to take this  when to take this  additional instructions   pantoprazole 40 MG tablet Commonly known as: PROTONIX Take 1 tablet (40 mg total) by mouth daily before breakfast.   PROBIOTIC PO Take 1 capsule by mouth every evening.   psyllium 58.6 % powder Commonly  known as: METAMUCIL Take 1 packet by mouth at bedtime.   rosuvastatin 20 MG tablet Commonly known as: CRESTOR Take 1 tablet (20 mg total) by mouth daily. What changed: when to take this   sodium chloride 0.65 % Soln nasal spray Commonly known as: OCEAN Place 1 spray into both nostrils as needed for congestion.   spironolactone 25 MG tablet Commonly known as: ALDACTONE Take 1 tablet (25 mg total) by mouth daily. What changed: when to take this   tamsulosin 0.4 MG Caps capsule Commonly known as: FLOMAX TAKE 1 CAPSULE(0.4 MG) BY MOUTH DAILY What changed:   how much to take  how to take this  when to take this  additional instructions   traMADol 50 MG tablet Commonly known as: ULTRAM TAKE 1 TABLET BY MOUTH EVERY 12 HOURS AS NEEDED What changed: when to take this          Outstanding Labs/Studies   None  Duration of Discharge Encounter   Greater than 30 minutes including physician time.  Signed, [name] Rieger Ninfa Meeker, PA-C 02/02/2020, 12:31 PM

## 2020-02-02 NOTE — Progress Notes (Signed)
RN accompanied patient to radiology for ordered chest x-ray and returned to [place] without incident.  Dr. Lars Mage at bedside upon return and discussed ICD procedure with patient and post plan: 1) stay in left arm sling until tomorrow, 2) may remove outer dressing tomorrow but do not pick at or remove steri strips as they will begin to come off in several days and do not pull at them or remove them early, 3) do not shower for 5-7 days (sponge bath only).  Follow up appointments already done.  Reviewed all of this information with patient. Patient states understands.

## 2020-02-02 NOTE — H&P (Signed)
Electrophysiology Office Follow up Visit Note:    Date:  12/28/2019   ID:  [name] [name], DOB February 13, 1940, MRN [id number]  PCP:  [name], [facility] Cardiologist:  Ida Rogue, MD  [facility] Electrophysiologist:  Vickie Epley, MD    Interval History:    [name] [name] is a 80 y.o. male who presents for a follow up visit. They were last seen in clinic November 09, 2019 for his ischemic cardiomyopathy and history of ventricular tachycardia.  His history of ventricular tachycardia dates back to June when he presented with an NSTEMI.  When he arrived to the hospital he was in a monomorphic wide-complex tachycardia consistent with VT.  This ultimately broke spontaneously to sinus bradycardia.  At her last appointment he reported NYHA class II symptoms.  During that appointment we reviewed a recent cardiac MRI which showed a significant scar burden in the left ventricle and a left ventricular mural thrombus.  After that appointment, the plan was to continue anticoagulation for 6 weeks to allow the left ventricular mural thrombus to resolve.  He has since had a repeat echocardiogram with contrast which demonstrated resolution of the left ventricular mural thrombus.  By echo volumes, ejection fraction is stable around 34 to 35%.  The global strain estimate of left ventricular function is also approximately 30%.  His symptoms are overall stable.        Past Medical History:  Diagnosis Date  . Aortic insufficiency    a. noted on TTE 2015; b. 06/2019 Echo: AI not visualized.  . Arthritis   . CAD (coronary artery disease)    a. remote PCI in 1991 and 2005; b. MV 3/15: old inferior MI, no ischemia, LVEF 50%, slight inferior wall hypokniesis; c. 06/2019 NSTEMI/PCI: LM nl, LAD 80p/m (Atherectomy & 4.5x18 Resolute Onyx DES), 26m/d, D1 75 (PTCA), RI patent stent, LCX nl, RCA 100p, RPAV fills via L->R collats from LCX.  [name] Chicken pox   . Colon polyps     4 pre-cancerous   . Diverticulitis   . DM type 2 (diabetes mellitus, type 2) ([place])   . Family history of adverse reaction to anesthesia   . GERD (gastroesophageal reflux disease)   . Heart murmur   . History of kidney stones   . HOH (hard of hearing)   . Hyperlipidemia   . Hypertension   . Ischemic cardiomyopathy    a. TTE 2015: EF  50-55%, mild global HK; b. 06/2019 Echo: EF 45-50%, Gr1 DD, basal inf AK. Triv MR.  . Kidney stones   . Melanoma ([place]) 1980   Resected from his back  . Mitral regurgitation    a. noted on TTE 2015  . Myocardial infarction [facility])          Past Surgical History:  Procedure Laterality Date  . CHOLECYSTECTOMY  2012  . COLONOSCOPY     in 2003 with polyp removed and leak anastomosis had to have open abdominal surgery   . CORONARY ANGIOPLASTY WITH STENT PLACEMENT  1991 & 2005  . CORONARY ATHERECTOMY N/A 07/08/2019   Procedure: CORONARY ATHERECTOMY;  Surgeon: Jettie Booze, MD;  Location: [place] [facility];  Service: Cardiovascular;  Laterality: N/A;  . CORONARY BALLOON ANGIOPLASTY N/A 07/08/2019   Procedure: CORONARY BALLOON ANGIOPLASTY;  Surgeon: Jettie Booze, MD;  Location: [place] [facility];  Service: Cardiovascular;  Laterality: N/A;  diagonal   . CORONARY STENT INTERVENTION N/A  07/08/2019   Procedure: CORONARY STENT INTERVENTION;  Surgeon: Jettie Booze, MD;  Location: [place] [facility];  Service: Cardiovascular;  Laterality: N/A;  lad  . CYSTOSCOPY/URETEROSCOPY/HOLMIUM LASER/STENT PLACEMENT Right 01/02/2018   Procedure: CYSTOSCOPY/URETEROSCOPY/HOLMIUM LASER/STENT PLACEMENT;  Surgeon: [name], MD;  Location: [facility];  Service: Urology;  Laterality: Right;  . ESOPHAGOGASTRODUODENOSCOPY (EGD) WITH PROPOFOL N/A 11/19/2016   Procedure: ESOPHAGOGASTRODUODENOSCOPY (EGD) WITH PROPOFOL;  Surgeon: [name], MD;  Location: [facility];  Service: Endoscopy;  Laterality: N/A;  .  ESOPHAGOGASTRODUODENOSCOPY (EGD) WITH PROPOFOL N/A 02/02/2019   Procedure: ESOPHAGOGASTRODUODENOSCOPY (EGD) WITH PROPOFOL;  Surgeon: [name], MD;  Location: [facility];  Service: Endoscopy;  Laterality: N/A;  . INTRAVASCULAR PRESSURE WIRE/FFR STUDY N/A 07/07/2019   Procedure: INTRAVASCULAR PRESSURE WIRE/FFR STUDY;  Surgeon: Nelva Bush, MD;  Location: [place] [facility];  Service: Cardiovascular;  Laterality: N/A;  . INTRAVASCULAR ULTRASOUND/IVUS N/A 07/08/2019   Procedure: Intravascular Ultrasound/IVUS;  Surgeon: Jettie Booze, MD;  Location: [name] [facility];  Service: Cardiovascular;  Laterality: N/A;  . LEFT HEART CATH N/A 07/08/2019   Procedure: Left Heart Cath;  Surgeon: Jettie Booze, MD;  Location: [place] [facility];  Service: Cardiovascular;  Laterality: N/A;  . LEFT HEART CATH AND CORONARY ANGIOGRAPHY N/A 07/07/2019   Procedure: LEFT HEART CATH AND CORONARY ANGIOGRAPHY;  Surgeon: Nelva Bush, MD;  Location: [place] [facility];  Service: Cardiovascular;  Laterality: N/A;  . LITHOTRIPSY  2015  . MELANOMA EXCISION  1980   malignant  . NERVE SURGERY  2015   ulna nerve  . TONSILLECTOMY  1945  . WISDOM TOOTH EXTRACTION      Current Medications: Active Medications      Current Meds  Medication Sig  . acetaminophen (TYLENOL) 650 MG CR tablet Take 650 mg by mouth 2 (two) times daily as needed (for pain).   [name] apixaban (ELIQUIS) 5 MG TABS tablet Take 1 tablet (5 mg total) by mouth 2 (two) times daily.  [name] aspirin EC 81 MG tablet Take 81 mg by mouth daily.  . carvedilol (COREG) 6.25 MG tablet Take 1 tablet (6.25 mg total) by mouth 2 (two) times daily.  . clopidogrel (PLAVIX) 75 MG tablet Take 1 tablet (75 mg total) by mouth daily with breakfast.  . ezetimibe (ZETIA) 10 MG tablet TAKE 1 TABLET(10 MG) BY MOUTH DAILY  . fluticasone (FLONASE) 50 MCG/ACT nasal spray Place 2 sprays into both nostrils daily.  [name] glipiZIDE (GLUCOTROL) 10 MG tablet  Take 10 mg by mouth daily before breakfast.  . hydrochlorothiazide (HYDRODIURIL) 25 MG tablet TAKE 1 TABLET(25 MG) BY MOUTH DAILY  . hydrocortisone cream 1 % Apply 1 application topically See admin instructions. Apply to affected area/rash every 3 days  . ipratropium (ATROVENT) 0.06 % nasal spray Place 2 sprays into both nostrils 3 (three) times daily.  [name] lisinopril (ZESTRIL) 10 MG tablet TAKE 1 TABLET BY MOUTH DAILY  . Multiple Vitamin (MULTIVITAMIN) capsule Take 1 capsule by mouth daily with lunch.   . nitroGLYCERIN (NITROSTAT) 0.4 MG SL tablet Place 1 tablet (0.4 mg total) under the tongue every 5 (five) minutes x 3 doses as needed for chest pain.  . Omega-3 Fatty Acids (FISH OIL PO) Take 1 capsule by mouth daily at 12 noon.   Glory Rosebush VERIO test strip 2 (two) times daily.  [name] OZEMPIC, 1 MG/DOSE, 4 MG/3ML SOPN ADMINISTER 1 MG UNDER THE SKIN 1 TIME A WEEK  . pantoprazole (PROTONIX) 40 MG tablet Take 1 tablet (  40 mg total) by mouth daily before breakfast.  . Polyvinyl Alcohol-Povidone (MURINE TEARS FOR DRY EYES OP) Place 1-2 drops into both eyes as needed (for dry eyes).   . psyllium (METAMUCIL) 58.6 % powder Take 1 packet by mouth at bedtime as needed (if no roughage is consumed that day- Mix and drink).   . rosuvastatin (CRESTOR) 20 MG tablet Take 1 tablet (20 mg total) by mouth daily.  . sodium chloride (OCEAN) 0.65 % SOLN nasal spray Place 1 spray into both nostrils as needed for congestion.  [name] spironolactone (ALDACTONE) 25 MG tablet Take 1 tablet (25 mg total) by mouth daily.  . tamsulosin (FLOMAX) 0.4 MG CAPS capsule TAKE 1 CAPSULE(0.4 MG) BY MOUTH DAILY  . traMADol (ULTRAM) 50 MG tablet TAKE 1 TABLET BY MOUTH EVERY 12 HOURS AS NEEDED       Allergies:   Metformin and related, Azithromycin, Other, Percocet [oxycodone-acetaminophen], and Jardiance [empagliflozin]   Social History        Socioeconomic History  . Marital status: Married    Spouse name: Not on file  . Number of  children: Not on file  . Years of education: Not on file  . Highest education level: Not on file  Occupational History  . Not on file  Tobacco Use  . Smoking status: Current Some Day Smoker    Types: Pipe  . Smokeless tobacco: Former Systems developer    Quit date: 10/17/2015  Vaping Use  . Vaping Use: Never used  Substance and Sexual Activity  . Alcohol use: Not Currently    Alcohol/week: 0.0 - 1.0 standard drinks  . Drug use: No  . Sexual activity: Not on file  Other Topics Concern  . Not on file  Social History Narrative   Married    Social Determinants of Health      Financial Resource Strain: Medium Risk  . Difficulty of Paying Living Expenses: Somewhat hard  Food Insecurity: Not on file  Transportation Needs: Not on file  Physical Activity: Not on file  Stress: Not on file  Social Connections: Not on file     Family History: The patient's family history includes Colon cancer in his father; Diabetes in his father and mother; Heart attack in his mother; Heart disease in his mother; Hyperlipidemia in his mother; Hypertension in his mother; Kidney cancer in his father; Liver cancer in his father; Lung cancer in his father. There is no history of Bladder Cancer or Prostate cancer.  ROS:   Please see the history of present illness.    All other systems reviewed and are negative.  EKGs/Labs/Other Studies Reviewed:    The following studies were reviewed today: Echo, prior notes  December 21, 2019 echo personally reviewed Left ventricular function significantly reduced, 35% Findings suggestive of a inferior scar No evidence of a left ventricular mural thrombus Mildly dilated left ventricle Right ventricular function normal  EKG:  The ekg ordered today demonstrates sinus rhythm, nonspecific interventricular conduction delay, left axis deviation, normal QTC, no PVCs  Recent Labs: 07/07/2019: B Natriuretic Peptide 111.1; Magnesium 1.4 07/09/2019: Hemoglobin 14.6;  Platelets 173 12/21/2019: ALT 36; BUN 22; Creatinine, Ser 0.88; Potassium 4.4; Sodium 137  Recent Lipid Panel Labs (Brief)          Component Value Date/Time   CHOL 124 12/21/2019 0919   TRIG 267 (H) 12/21/2019 0919   HDL 36 (L) 12/21/2019 0919   CHOLHDL 3.4 12/21/2019 0919   VLDL 53 (H) 12/21/2019 0919   LDLCALC 35  12/21/2019 0919   LDLDIRECT 55.0 02/10/2019 0903      Physical Exam:    VS:  BP 102/64   Pulse 79   Ht 6' (1.829 m)   Wt 202 lb (91.6 kg)   SpO2 98%   BMI 27.40 kg/m        Wt Readings from Last 3 Encounters:  12/28/19 202 lb (91.6 kg)  12/22/19 203 lb (92.1 kg)  12/07/19 205 lb (93 kg)     GEN:  Well nourished, well developed in no acute distress HEENT: Normal NECK: No JVD; No carotid bruits LYMPHATICS: No lymphadenopathy CARDIAC: RRR, no murmurs, rubs, gallops RESPIRATORY:  Clear to auscultation without rales, wheezing or rhonchi  ABDOMEN: Soft, non-tender, non-distended MUSCULOSKELETAL:  No edema; No deformity  SKIN: Warm and dry NEUROLOGIC:  Alert and oriented x 3 PSYCHIATRIC:  Normal affect   ASSESSMENT:    1. Ischemic cardiomyopathy   2. VT (ventricular tachycardia) ([place])   3. NSTEMI (non-ST elevated myocardial infarction) ([place])    PLAN:    In order of problems listed above:  1. Chronic systolic heart failure secondary to severe coronary artery disease Ejection fraction remains around 35% it was reported as 35-40 but on my personal review of the three-dimensional volumes, EF is closer to 35.  Cardiac MRI confirms EF of 35% as well.  There is significant scar burden secondary to his history of coronary artery disease.  Given his persistently reduced left ventricular function, evidence of scar on cardiac MRI and presence of nonsustained ventricular tachycardia, favor implanting a defibrillator for secondary prevention.  We will plan on a [employer] single coil VVI ICD.  2.  Left ventricular mural thrombus Now  resolved. We will plan on a total of 3 months of anticoagulation for his left ventricular mural thrombus.  Can plan to implant his defibrillator at the conclusion of his 3 months of anticoagulation.  This will be around the end of January.  We will plan on performing the procedure at [facility].  3.  Ventricular tachycardia Has a history of sustained monomorphic ventricular tachycardia in June.  While this was in the setting of an NSTEMI, the monomorphic nature of his VT suggest it was scar mediated.  Plan for ICD for secondary prevention.   The patient has an ischemic CM (EF35%), NYHA Class II CHF, and CAD. At this time, hemeets criteria for ICD implantation for secondary prevention of sudden death. I have had a thorough discussion with the patient reviewing options. The patient and their family (if available) have had opportunities to ask questions and have them answered. The patient and I have decided together through a shared decision making process to proceed withICD implant.  Risks, benefits, alternatives to ICD implantation were discussed in detail with the patient today. The patient understands that the risks include but are not limited to bleeding, infection, pneumothorax, perforation, tamponade, vascular damage, renal failure, MI, stroke, death, inappropriate shocks, and lead dislodgement and wishes to proceed.    ----------------------------------------------------------------------------- I have seen, examined the patient, and reviewed the above assessment and plan.    Doing well this AM. Plan for VVI ICD for secondary prevention.    Vickie Epley, MD 02/02/2020 7:18 AM

## 2020-02-02 NOTE — Discharge Instructions (Signed)
    Supplemental Discharge Instructions for  Pacemaker/Defibrillator Patients  Activity No heavy lifting or vigorous activity with your left/right arm for 6 to 8 weeks.  Do not raise your left/right arm above your head for one week.  Gradually raise your affected arm as drawn below.           __  NO DRIVING for   2 weeks  ;  WOUND CARE - Keep the wound area clean and dry.  Do not get this area wet for one week. No showers for one week; you may shower on     . - The tape/steri-strips on your wound will fall off; do not pull them off.  No bandage is needed on the site.  DO  NOT apply any creams, oils, or ointments to the wound area. - If you notice any drainage or discharge from the wound, any swelling or bruising at the site, or you develop a fever > 101? F after you are discharged home, call the office at once.  Special Instructions - You are still able to use cellular telephones; use the ear opposite the side where you have your pacemaker/defibrillator.  Avoid carrying your cellular phone near your device. - When traveling through airports, show security personnel your identification card to avoid being screened in the metal detectors.  Ask the security personnel to use the hand wand. - Avoid arc welding equipment, MRI testing (magnetic resonance imaging), TENS units (transcutaneous nerve stimulators).  Call the office for questions about other devices. - Avoid electrical appliances that are in poor condition or are not properly grounded. - Microwave ovens are safe to be near or to operate.  Additional information for defibrillator patients should your device go off: - If your device goes off ONCE and you feel fine afterward, notify the device clinic nurses. - If your device goes off ONCE and you do not feel well afterward, call 911. - If your device goes off TWICE, call 911. - If your device goes off THREE times in one day, call 911.  DO NOT DRIVE YOURSELF OR A FAMILY MEMBER WITH A  DEFIBRILLATOR TO THE HOSPITAL--CALL 911.

## 2020-02-03 DIAGNOSIS — I429 Cardiomyopathy, unspecified: Secondary | ICD-10-CM

## 2020-02-07 ENCOUNTER — Encounter: Payer: Self-pay | Admitting: Cardiology

## 2020-02-10 ENCOUNTER — Telehealth: Payer: Self-pay | Admitting: Family

## 2020-02-10 ENCOUNTER — Other Ambulatory Visit: Payer: Self-pay | Admitting: *Deleted

## 2020-02-10 ENCOUNTER — Other Ambulatory Visit: Payer: Self-pay

## 2020-02-10 MED ORDER — GLIPIZIDE ER 10 MG PO TB24: 10.0000 mg | ORAL_TABLET | Freq: Every day | ORAL | 1 refills | Status: DC

## 2020-02-10 MED ORDER — EZETIMIBE 10 MG PO TABS: ORAL_TABLET | ORAL | 0 refills | Status: DC

## 2020-02-10 NOTE — Telephone Encounter (Signed)
Can you call the patient and confirm? Looks like the order on 01/28/20 was ended by a CPhT, likely whoever called him to review medications prior to his procedure. I don't see any documentation that a provider advised him to discontinue.   When he and I last talked, and when he last saw PCP, he was to still be taking glipizide.

## 2020-02-10 NOTE — Telephone Encounter (Signed)
FYI patient has been consistently taking & said they must have d/c on accident. I have sent back in for patient tp [facility].

## 2020-02-10 NOTE — Telephone Encounter (Signed)
Patient called in wanted a refill for Glipizide- ER 10 mg  90 day

## 2020-02-10 NOTE — Telephone Encounter (Addendum)
I just wanted to clarify that patient should be off this medication. This was d/c1/14/22. Looks like per last note from [facility] he should be taking?/

## 2020-02-15 ENCOUNTER — Other Ambulatory Visit: Payer: Self-pay

## 2020-02-15 ENCOUNTER — Telehealth: Payer: Self-pay

## 2020-02-15 ENCOUNTER — Ambulatory Visit (INDEPENDENT_AMBULATORY_CARE_PROVIDER_SITE_OTHER): Payer: Medicare Other | Admitting: Emergency Medicine

## 2020-02-15 ENCOUNTER — Observation Stay (HOSPITAL_COMMUNITY)
Admission: AD | Admit: 2020-02-15 | Discharge: 2020-02-16 | Disposition: A | Discharge status: Home / Self Care | Payer: Medicare Other | Source: Ambulatory Visit | Attending: Cardiology | Admitting: Cardiology

## 2020-02-15 DIAGNOSIS — T82198A Other mechanical complication of other cardiac electronic device, initial encounter: Secondary | ICD-10-CM

## 2020-02-15 DIAGNOSIS — I214 Non-ST elevation (NSTEMI) myocardial infarction: Secondary | ICD-10-CM | POA: Diagnosis not present

## 2020-02-15 DIAGNOSIS — I5022 Chronic systolic (congestive) heart failure: Secondary | ICD-10-CM | POA: Diagnosis not present

## 2020-02-15 DIAGNOSIS — Z7901 Long term (current) use of anticoagulants: Secondary | ICD-10-CM | POA: Diagnosis not present

## 2020-02-15 DIAGNOSIS — F17290 Nicotine dependence, other tobacco product, uncomplicated: Secondary | ICD-10-CM | POA: Insufficient documentation

## 2020-02-15 DIAGNOSIS — Z959 Presence of cardiac and vascular implant and graft, unspecified: Secondary | ICD-10-CM

## 2020-02-15 DIAGNOSIS — I255 Ischemic cardiomyopathy: Secondary | ICD-10-CM

## 2020-02-15 DIAGNOSIS — I472 Ventricular tachycardia, unspecified: Secondary | ICD-10-CM

## 2020-02-15 DIAGNOSIS — I10 Essential (primary) hypertension: Secondary | ICD-10-CM | POA: Diagnosis not present

## 2020-02-15 DIAGNOSIS — E119 Type 2 diabetes mellitus without complications: Secondary | ICD-10-CM | POA: Diagnosis not present

## 2020-02-15 DIAGNOSIS — Y658 Other specified misadventures during surgical and medical care: Secondary | ICD-10-CM | POA: Diagnosis not present

## 2020-02-15 DIAGNOSIS — T82120A Displacement of cardiac electrode, initial encounter: Secondary | ICD-10-CM | POA: Diagnosis not present

## 2020-02-15 DIAGNOSIS — Z79899 Other long term (current) drug therapy: Secondary | ICD-10-CM | POA: Diagnosis not present

## 2020-02-15 DIAGNOSIS — T82110A Breakdown (mechanical) of cardiac electrode, initial encounter: Secondary | ICD-10-CM

## 2020-02-15 DIAGNOSIS — I2510 Atherosclerotic heart disease of native coronary artery without angina pectoris: Secondary | ICD-10-CM | POA: Diagnosis not present

## 2020-02-15 DIAGNOSIS — Z794 Long term (current) use of insulin: Secondary | ICD-10-CM | POA: Diagnosis not present

## 2020-02-15 DIAGNOSIS — Z95818 Presence of other cardiac implants and grafts: Secondary | ICD-10-CM

## 2020-02-15 DIAGNOSIS — Z85820 Personal history of malignant melanoma of skin: Secondary | ICD-10-CM | POA: Insufficient documentation

## 2020-02-15 DIAGNOSIS — Z7902 Long term (current) use of antithrombotics/antiplatelets: Secondary | ICD-10-CM | POA: Insufficient documentation

## 2020-02-15 DIAGNOSIS — Z20822 Contact with and (suspected) exposure to covid-19: Secondary | ICD-10-CM | POA: Insufficient documentation

## 2020-02-15 DIAGNOSIS — Z95810 Presence of automatic (implantable) cardiac defibrillator: Secondary | ICD-10-CM

## 2020-02-15 LAB — CUP PACEART INCLINIC DEVICE CHECK
Date Time Interrogation Session: 20220201122734
HighPow Impedance: 82 Ohm
Implantable Lead Implant Date: 20220119
Implantable Lead Location: 753860
Implantable Lead Model: 673
Implantable Lead Serial Number: 154711
Implantable Pulse Generator Implant Date: 20220119
Lead Channel Impedance Value: 442 Ohm
Lead Channel Pacing Threshold Amplitude: 7.5 V
Lead Channel Pacing Threshold Pulse Width: 0.4 ms
Lead Channel Sensing Intrinsic Amplitude: 14.9 mV
Lead Channel Setting Pacing Amplitude: 3.5 V
Lead Channel Setting Pacing Pulse Width: 0.4 ms
Lead Channel Setting Sensing Sensitivity: 0.6 mV
Pulse Gen Serial Number: 291993

## 2020-02-15 LAB — SURGICAL PCR SCREEN
MRSA, PCR: NEGATIVE
Staphylococcus aureus: NEGATIVE

## 2020-02-15 LAB — GLUCOSE, CAPILLARY
Glucose-Capillary: 133 mg/dL — ABNORMAL HIGH (ref 70–99)
Glucose-Capillary: 169 mg/dL — ABNORMAL HIGH (ref 70–99)

## 2020-02-15 LAB — SARS CORONAVIRUS 2 (TAT 6-24 HRS): SARS Coronavirus 2: NEGATIVE

## 2020-02-15 MED ORDER — INSULIN ASPART 100 UNIT/ML ~~LOC~~ SOLN
0.0000 [IU] | Freq: Three times a day (TID) | SUBCUTANEOUS | Status: DC
  Administered 2020-02-16 (×2): 1 [IU] via SUBCUTANEOUS
  Administered 2020-02-16: 2 [IU] via SUBCUTANEOUS

## 2020-02-15 MED ORDER — SODIUM CHLORIDE 0.9 % IV SOLN: INTRAVENOUS | Status: DC

## 2020-02-15 MED ORDER — ROSUVASTATIN CALCIUM 20 MG PO TABS
20.0000 mg | ORAL_TABLET | Freq: Every day | ORAL | Status: DC
  Administered 2020-02-15: 20 mg via ORAL
  Filled 2020-02-15 (×2): qty 1

## 2020-02-15 MED ORDER — NITROGLYCERIN 0.4 MG SL SUBL: 0.4000 mg | SUBLINGUAL_TABLET | SUBLINGUAL | Status: DC | PRN

## 2020-02-15 MED ORDER — CHLORHEXIDINE GLUCONATE 4 % EX LIQD
4.0000 "application " | Freq: Once | CUTANEOUS | Status: DC
  Filled 2020-02-15: qty 60

## 2020-02-15 MED ORDER — EZETIMIBE 10 MG PO TABS: 10.0000 mg | ORAL_TABLET | Freq: Every day | ORAL | Status: DC

## 2020-02-15 MED ORDER — LIDOCAINE HCL (PF) 1 % IJ SOLN
INTRAMUSCULAR | Status: AC
  Filled 2020-02-15: qty 60

## 2020-02-15 MED ORDER — SODIUM CHLORIDE 0.9 % IV SOLN
INTRAVENOUS | Status: AC
  Filled 2020-02-15: qty 2

## 2020-02-15 MED ORDER — SODIUM CHLORIDE 0.9 % IV SOLN
80.0000 mg | INTRAVENOUS | Status: DC
  Filled 2020-02-15: qty 2

## 2020-02-15 MED ORDER — ONDANSETRON HCL 4 MG/2ML IJ SOLN: 4.0000 mg | Freq: Four times a day (QID) | INTRAMUSCULAR | Status: DC | PRN

## 2020-02-15 MED ORDER — CEFAZOLIN SODIUM-DEXTROSE 2-4 GM/100ML-% IV SOLN
INTRAVENOUS | Status: AC
  Filled 2020-02-15: qty 100

## 2020-02-15 MED ORDER — TRAMADOL HCL 50 MG PO TABS
50.0000 mg | ORAL_TABLET | Freq: Two times a day (BID) | ORAL | Status: DC | PRN
  Administered 2020-02-15: 50 mg via ORAL
  Filled 2020-02-15: qty 1

## 2020-02-15 MED ORDER — ACETAMINOPHEN 500 MG PO TABS: 1000.0000 mg | ORAL_TABLET | Freq: Four times a day (QID) | ORAL | Status: DC | PRN

## 2020-02-15 MED ORDER — LISINOPRIL 10 MG PO TABS
10.0000 mg | ORAL_TABLET | Freq: Every day | ORAL | Status: DC
  Administered 2020-02-16: 10 mg via ORAL
  Filled 2020-02-15: qty 1

## 2020-02-15 MED ORDER — SPIRONOLACTONE 25 MG PO TABS
25.0000 mg | ORAL_TABLET | Freq: Every day | ORAL | Status: DC
  Administered 2020-02-16: 25 mg via ORAL
  Filled 2020-02-15: qty 1

## 2020-02-15 MED ORDER — SODIUM CHLORIDE 0.9 % IV SOLN
INTRAVENOUS | Status: DC
Start: 2020-02-15 — End: 2020-02-15

## 2020-02-15 MED ORDER — CARVEDILOL 6.25 MG PO TABS
6.2500 mg | ORAL_TABLET | Freq: Two times a day (BID) | ORAL | Status: DC
  Administered 2020-02-15 – 2020-02-16 (×2): 6.25 mg via ORAL
  Filled 2020-02-15 (×3): qty 1

## 2020-02-15 MED ORDER — TAMSULOSIN HCL 0.4 MG PO CAPS
0.4000 mg | ORAL_CAPSULE | Freq: Every day | ORAL | Status: DC
  Administered 2020-02-16: 0.4 mg via ORAL
  Filled 2020-02-15: qty 1

## 2020-02-15 MED ORDER — PANTOPRAZOLE SODIUM 40 MG PO TBEC
40.0000 mg | DELAYED_RELEASE_TABLET | Freq: Every day | ORAL | Status: DC
  Administered 2020-02-16: 40 mg via ORAL
  Filled 2020-02-15: qty 1

## 2020-02-15 MED ORDER — CEFAZOLIN SODIUM-DEXTROSE 2-4 GM/100ML-% IV SOLN
2.0000 g | INTRAVENOUS | Status: DC
  Filled 2020-02-15: qty 100

## 2020-02-15 MED ORDER — HYDROCHLOROTHIAZIDE 25 MG PO TABS
25.0000 mg | ORAL_TABLET | Freq: Every day | ORAL | Status: DC
  Administered 2020-02-16: 25 mg via ORAL
  Filled 2020-02-15: qty 1

## 2020-02-15 NOTE — Telephone Encounter (Signed)
Spoke to Dr. Quentin Ore in regards to RV lead threshold. He would like for patient to go directly to [facility] and someone will reach out to patient as to where to go to have lead revision. Patient contacted and made aware. Advise to call with further questions or concerns.

## 2020-02-15 NOTE — Progress Notes (Signed)
Wound check appointment. Steri-strips removed. Wound without redness or edema. Incision edges approximated, wound well healed. Normal device function. Sensing and impedances consistent with implant measurements. RV threshold measurement ran and no capture noted at 7.5 V. Verified with Joey, [employer] rep. VP <1% of time. AChalmers Cater, PA-C made aware and verbal order to obtain CXR. Histogram distribution appropriate for patient and level of activity. No mode switches or ventricular arrhythmias noted. Patient educated about wound care, arm mobility, lifting restrictions, shock plan. ROV in 3 months with implanting physician. Next remote 05/16/20.   Attempted to call Dr. Quentin Ore and text. Spoke to A. Tillery, PA-C ordered CXR. Will follow up with results. Phone number on file correct.  [facility] [address] [place], [place] [address] [phone]

## 2020-02-15 NOTE — H&P (Signed)
Date:02/15/2020  [id number] [name], [name] [id number], [id number]  [id number], [name], [name] [facility]CareCardiologist:Timothy Rockey Situ, MD [facility]CareElectrophysiologist: Vickie Epley, MD   Interval History:   [name] [name] a 80 y.o.male w/ ischemic cardiomyopathy and history of ventricular tachycardia. His history of ventricular tachycardia dates back to June when he presented with an NSTEMI. When he arrived to the hospital he was in a monomorphic wide-complex tachycardia consistent with VT. This ultimately broke spontaneously to sinus bradycardia. He has since had a repeat echocardiogram with contrast which demonstrated resolution of the left ventricular mural thrombus. By echo volumes, ejection fraction is stable around 34 to 35%.   He underwent ICD implant 02/02/2020. Post op he did well but at today's device check his RV lead had a significantly elevated threshold. The impedance and sensing were stable. He presented today to have the lead revised but due to a prolonged time to get his covid results, the implant is going to be delayed until tomorrow.        Past Medical History:  Diagnosis Date  . Aortic insufficiency    a. noted on TTE 2015; b. 06/2019 Echo: AI not visualized.  . Arthritis   . CAD (coronary artery disease)    a. remote PCI in 1991 and 2005; b. MV 3/15: old inferior MI, no ischemia, LVEF 50%, slight inferior wall hypokniesis; c. 06/2019 NSTEMI/PCI: LM nl, LAD 80p/m (Atherectomy & 4.5x18 Resolute Onyx DES), 84m/d, D1 75 (PTCA), RI patent stent, LCX nl, RCA 100p, RPAV fills via L->R collats from LCX.  [name] Chicken pox   . Colon polyps    4 pre-cancerous   . Diverticulitis   . DM type 2 (diabetes mellitus, type 2) ([place])   . Family history of adverse reaction to anesthesia   . GERD (gastroesophageal reflux disease)   . Heart murmur   . History of kidney stones   . HOH (hard of hearing)   .  Hyperlipidemia   . Hypertension   . Ischemic cardiomyopathy    a. TTE 2015: EF 50-55%, mild global HK; b. 06/2019 Echo: EF 45-50%, Gr1 DD, basal inf AK. Triv MR.  . Kidney stones   . Melanoma ([place]) 1980   Resected from his back  . Mitral regurgitation    a. noted on TTE 2015  . Myocardial infarction [facility])          Past Surgical History:  Procedure Laterality Date  . CHOLECYSTECTOMY  2012  . COLONOSCOPY     in 2003 with polyp removed and leak anastomosis had to have open abdominal surgery   . CORONARY ANGIOPLASTY WITH STENT PLACEMENT  1991 & 2005  . CORONARY ATHERECTOMY N/A 07/08/2019   Procedure: CORONARY ATHERECTOMY; Surgeon: Jettie Booze, MD; Location: [place] [facility]; Service: Cardiovascular; Laterality: N/A;  . CORONARY BALLOON ANGIOPLASTY N/A 07/08/2019   Procedure: CORONARY BALLOON ANGIOPLASTY; Surgeon: Jettie Booze, MD; Location: [place] [facility]; Service: Cardiovascular; Laterality: N/A; diagonal   . CORONARY STENT INTERVENTION N/A 07/08/2019   Procedure: CORONARY STENT INTERVENTION; Surgeon: Jettie Booze, MD; Location: [place] [facility]; Service: Cardiovascular; Laterality: N/A; lad  . CYSTOSCOPY/URETEROSCOPY/HOLMIUM LASER/STENT PLACEMENT Right 01/02/2018   Procedure: CYSTOSCOPY/URETEROSCOPY/HOLMIUM LASER/STENT PLACEMENT; Surgeon: [name], MD; Location: [facility] ORS; Service: Urology; Laterality: Right;  . ESOPHAGOGASTRODUODENOSCOPY (EGD) WITH PROPOFOL N/A 11/19/2016   Procedure: ESOPHAGOGASTRODUODENOSCOPY (EGD) WITH PROPOFOL; Surgeon: [name], MD; Location: [facility] ENDOSCOPY; Service: Endoscopy; Laterality: N/A;  . ESOPHAGOGASTRODUODENOSCOPY (EGD) WITH PROPOFOL N/A 02/02/2019   Procedure: ESOPHAGOGASTRODUODENOSCOPY (EGD) WITH PROPOFOL;  Surgeon: [name], MD; Location: [facility] ENDOSCOPY; Service: Endoscopy; Laterality: N/A;  . INTRAVASCULAR PRESSURE WIRE/FFR STUDY N/A 07/07/2019    Procedure: INTRAVASCULAR PRESSURE WIRE/FFR STUDY; Surgeon: Nelva Bush, MD; Location: [place] [facility]; Service: Cardiovascular; Laterality: N/A;  . INTRAVASCULAR ULTRASOUND/IVUS N/A 07/08/2019   Procedure: Intravascular Ultrasound/IVUS; Surgeon: Jettie Booze, MD; Location: [place] [facility]; Service: Cardiovascular; Laterality: N/A;  . LEFT HEART CATH N/A 07/08/2019   Procedure: Left Heart Cath; Surgeon: Jettie Booze, MD; Location: [place] [facility]; Service: Cardiovascular; Laterality: N/A;  . LEFT HEART CATH AND CORONARY ANGIOGRAPHY N/A 07/07/2019   Procedure: LEFT HEART CATH AND CORONARY ANGIOGRAPHY; Surgeon: Nelva Bush, MD; Location: [place] [facility]; Service: Cardiovascular; Laterality: N/A;  . LITHOTRIPSY  2015  . MELANOMA EXCISION  1980   malignant  . NERVE SURGERY  2015   ulna nerve  . TONSILLECTOMY  1945  . WISDOM TOOTH EXTRACTION      Current Medications: Active Medications      Current Meds  Medication Sig  . acetaminophen (TYLENOL) 650 MG CR tablet Take 650 mg by mouth 2 (two) times daily as needed (for pain).   [name] apixaban (ELIQUIS) 5 MG TABS tablet Take 1 tablet (5 mg total) by mouth 2 (two) times daily.  [name] aspirin EC 81 MG tablet Take 81 mg by mouth daily.  . carvedilol (COREG) 6.25 MG tablet Take 1 tablet (6.25 mg total) by mouth 2 (two) times daily.  . clopidogrel (PLAVIX) 75 MG tablet Take 1 tablet (75 mg total) by mouth daily with breakfast.  . ezetimibe (ZETIA) 10 MG tablet TAKE 1 TABLET(10 MG) BY MOUTH DAILY  . fluticasone (FLONASE) 50 MCG/ACT nasal spray Place 2 sprays into both nostrils daily.  [name] glipiZIDE (GLUCOTROL) 10 MG tablet Take 10 mg by mouth daily before breakfast.  . hydrochlorothiazide (HYDRODIURIL) 25 MG tablet TAKE 1 TABLET(25 MG) BY MOUTH DAILY  . hydrocortisone cream 1 % Apply 1 application topically See admin instructions. Apply to affected area/rash every 3 days  . ipratropium  (ATROVENT) 0.06 % nasal spray Place 2 sprays into both nostrils 3 (three) times daily.  [name] lisinopril (ZESTRIL) 10 MG tablet TAKE 1 TABLET BY MOUTH DAILY  . Multiple Vitamin (MULTIVITAMIN) capsule Take 1 capsule by mouth daily with lunch.   . nitroGLYCERIN (NITROSTAT) 0.4 MG SL tablet Place 1 tablet (0.4 mg total) under the tongue every 5 (five) minutes x 3 doses as needed for chest pain.  . Omega-3 Fatty Acids (FISH OIL PO) Take 1 capsule by mouth daily at 12 noon.   Glory Rosebush VERIO test strip 2 (two) times daily.  [name] OZEMPIC, 1 MG/DOSE, 4 MG/3ML SOPN ADMINISTER 1 MG UNDER THE SKIN 1 TIME A WEEK  . pantoprazole (PROTONIX) 40 MG tablet Take 1 tablet (40 mg total) by mouth daily before breakfast.  . Polyvinyl Alcohol-Povidone (MURINE TEARS FOR DRY EYES OP) Place 1-2 drops into both eyes as needed (for dry eyes).   . psyllium (METAMUCIL) 58.6 % powder Take 1 packet by mouth at bedtime as needed (if no roughage is consumed that day- Mix and drink).   . rosuvastatin (CRESTOR) 20 MG tablet Take 1 tablet (20 mg total) by mouth daily.  . sodium chloride (OCEAN) 0.65 % SOLN nasal spray Place 1 spray into both nostrils as needed for congestion.  [name] spironolactone (ALDACTONE) 25 MG tablet Take 1 tablet (25 mg total) by mouth daily.  . tamsulosin (FLOMAX) 0.4 MG CAPS capsule TAKE 1 CAPSULE(0.4 MG)  BY MOUTH DAILY  . traMADol (ULTRAM) 50 MG tablet TAKE 1 TABLET BY MOUTH EVERY 12 HOURS AS NEEDED      Allergies:Metformin and related, Azithromycin, Other, Percocet [oxycodone-acetaminophen], and Jardiance [empagliflozin]  Social History        Socioeconomic History  . Marital status: Married    Spouse name: Not on file  . Number of children: Not on file  . Years of education: Not on file  . Highest education level: Not on file  Occupational History  . Not on file  Tobacco Use  . Smoking status: Current Some Day Smoker    Types: Pipe  . Smokeless tobacco: Former Systems developer    Quit date:  10/17/2015  Vaping Use  . Vaping Use: Never used  Substance and Sexual Activity  . Alcohol use: Not Currently    Alcohol/week: 0.0 - 1.0 standard drinks  . Drug use: No  . Sexual activity: Not on file  Other Topics Concern  . Not on file  Social History Narrative   Married    Social Determinants of Health      Financial Resource Strain: Medium Risk  . Difficulty of Paying Living Expenses: Somewhat hard  Food Insecurity: Not on file  Transportation Needs: Not on file  Physical Activity: Not on file  Stress: Not on file  Social Connections: Not on file    Family History: The patient'sfamily history includes Colon cancer in his father; Diabetes in his father and mother; Heart attack in his mother; Heart disease in his mother; Hyperlipidemia in his mother; Hypertension in his mother; Kidney cancer in his father; Liver cancer in his father; Lung cancer in his father. There is no history of Bladder Cancer or Prostate cancer.  ROS: Please see the history of present illness.  All other systems reviewed and are negative.  EKGs/Labs/Other Studies Reviewed:   The following studies were reviewed today: Echo, prior notes  December 21, 2019 echo personally reviewed Left ventricular function significantly reduced, 35% Findings suggestive of a inferior scar No evidence of a left ventricular mural thrombus Mildly dilated left ventricle Right ventricular function normal  EKG:The ekg ordered today demonstrates sinus rhythm, nonspecific interventricular conduction delay, left axis deviation, normal QTC, no PVCs  Recent Labs: 07/07/2019: B Natriuretic Peptide 111.1; Magnesium 1.4 07/09/2019: Hemoglobin 14.6; Platelets 173 12/21/2019: ALT 36; BUN 22; Creatinine, Ser 0.88; Potassium 4.4; Sodium 137 Recent Lipid Panel Labs (Brief)         Component Value Date/Time   CHOL 124 12/21/2019 0919   TRIG 267 (H) 12/21/2019 0919   HDL 36 (L) 12/21/2019 0919    CHOLHDL 3.4 12/21/2019 0919   VLDL 53 (H) 12/21/2019 0919   LDLCALC 35 12/21/2019 0919   LDLDIRECT 55.0 02/10/2019 0903      Physical Exam:    123/83 66 12 99%     Wt Readings from Last 3 Encounters:  12/28/19 202 lb (91.6 kg)  12/22/19 203 lb (92.1 kg)  12/07/19 205 lb (93 kg)    GEN: Well nourished, well developed in no acute distress HEENT: Normal NECK: No JVD; No carotid bruits LYMPHATICS: No lymphadenopathy CARDIAC: RRR, no murmurs, rubs, gallops. ICD pocket well healed. RESPIRATORY: Clear to auscultation without rales, wheezing or rhonchi  ABDOMEN: Soft, non-tender, non-distended MUSCULOSKELETAL: No edema; No deformity  SKIN: Warm and dry NEUROLOGIC: Alert and oriented x 3 PSYCHIATRIC: Normal affect   ASSESSMENT:   1. Ischemic cardiomyopathy   2. VT (ventricular tachycardia) ([place])   3. NSTEMI (non-ST elevated  myocardial infarction) ([place])   PLAN:    In order of problems listed above:  1. Lead malfunction - likely micro dislodgement.  - stop apixaban. Do not need to restart after implant.   2. LV mural thrombus - resolved - stop apixaban.  3. chronic systolic heart failure secondary to severe coronary artery disease Cont coreg, lisinopril, hctz, rosva plavix hold tonight. If pocket has good hemostasis, plan to restart tomorrow.  Lysbeth Galas T. Quentin Ore, MD, [facility] Cardiac Electrophysiology

## 2020-02-15 NOTE — Patient Instructions (Signed)
Please go to the following address to have your chest x-ray completed. We will call with results. Please call with any further questions or concerns.  [facility] [address], [place] [address] [phone]

## 2020-02-16 ENCOUNTER — Observation Stay (HOSPITAL_COMMUNITY): Payer: Medicare Other

## 2020-02-16 ENCOUNTER — Ambulatory Visit (HOSPITAL_COMMUNITY)
Admission: AD | Disposition: A | Discharge status: Home / Self Care | Payer: Self-pay | Source: Ambulatory Visit | Attending: Cardiology

## 2020-02-16 DIAGNOSIS — Z7901 Long term (current) use of anticoagulants: Secondary | ICD-10-CM | POA: Diagnosis not present

## 2020-02-16 DIAGNOSIS — T82120A Displacement of cardiac electrode, initial encounter: Secondary | ICD-10-CM | POA: Diagnosis not present

## 2020-02-16 DIAGNOSIS — T82198A Other mechanical complication of other cardiac electronic device, initial encounter: Secondary | ICD-10-CM | POA: Diagnosis not present

## 2020-02-16 DIAGNOSIS — F17290 Nicotine dependence, other tobacco product, uncomplicated: Secondary | ICD-10-CM | POA: Diagnosis not present

## 2020-02-16 DIAGNOSIS — Z95810 Presence of automatic (implantable) cardiac defibrillator: Secondary | ICD-10-CM | POA: Diagnosis not present

## 2020-02-16 DIAGNOSIS — I2510 Atherosclerotic heart disease of native coronary artery without angina pectoris: Secondary | ICD-10-CM | POA: Diagnosis not present

## 2020-02-16 DIAGNOSIS — Z79899 Other long term (current) drug therapy: Secondary | ICD-10-CM | POA: Diagnosis not present

## 2020-02-16 DIAGNOSIS — E119 Type 2 diabetes mellitus without complications: Secondary | ICD-10-CM | POA: Diagnosis not present

## 2020-02-16 DIAGNOSIS — Z7902 Long term (current) use of antithrombotics/antiplatelets: Secondary | ICD-10-CM | POA: Diagnosis not present

## 2020-02-16 DIAGNOSIS — Z20822 Contact with and (suspected) exposure to covid-19: Secondary | ICD-10-CM | POA: Diagnosis not present

## 2020-02-16 DIAGNOSIS — I214 Non-ST elevation (NSTEMI) myocardial infarction: Secondary | ICD-10-CM | POA: Diagnosis not present

## 2020-02-16 DIAGNOSIS — I472 Ventricular tachycardia: Secondary | ICD-10-CM | POA: Diagnosis not present

## 2020-02-16 DIAGNOSIS — I10 Essential (primary) hypertension: Secondary | ICD-10-CM | POA: Diagnosis not present

## 2020-02-16 DIAGNOSIS — Z85820 Personal history of malignant melanoma of skin: Secondary | ICD-10-CM | POA: Diagnosis not present

## 2020-02-16 DIAGNOSIS — Z794 Long term (current) use of insulin: Secondary | ICD-10-CM | POA: Diagnosis not present

## 2020-02-16 DIAGNOSIS — I255 Ischemic cardiomyopathy: Secondary | ICD-10-CM | POA: Diagnosis not present

## 2020-02-16 HISTORY — PX: LEAD REVISION/REPAIR: EP1213

## 2020-02-16 LAB — BASIC METABOLIC PANEL
Anion gap: 12 (ref 5–15)
BUN: 17 mg/dL (ref 8–23)
CO2: 24 mmol/L (ref 22–32)
Calcium: 9.5 mg/dL (ref 8.9–10.3)
Chloride: 102 mmol/L (ref 98–111)
Creatinine, Ser: 0.74 mg/dL (ref 0.61–1.24)
GFR, Estimated: >60 mL/min (ref >60)
Glucose, Bld: 117 mg/dL — ABNORMAL HIGH (ref 70–99)
Potassium: 3.6 mmol/L (ref 3.5–5.1)
Sodium: 138 mmol/L (ref 135–145)

## 2020-02-16 LAB — CBC
HCT: 42.1 % (ref 39.0–52.0)
Hemoglobin: 14.7 g/dL (ref 13.0–17.0)
MCH: 30.6 pg (ref 26.0–34.0)
MCHC: 34.9 g/dL (ref 30.0–36.0)
MCV: 87.5 fL (ref 80.0–100.0)
Platelets: 158 10*3/uL (ref 150–400)
RBC: 4.81 MIL/uL (ref 4.22–5.81)
RDW: 13.9 % (ref 11.5–15.5)
WBC: 7.4 10*3/uL (ref 4.0–10.5)
nRBC: 0 % (ref 0.0–0.2)

## 2020-02-16 LAB — GLUCOSE, CAPILLARY
Glucose-Capillary: 122 mg/dL — ABNORMAL HIGH (ref 70–99)
Glucose-Capillary: 146 mg/dL — ABNORMAL HIGH (ref 70–99)
Glucose-Capillary: 163 mg/dL — ABNORMAL HIGH (ref 70–99)
Glucose-Capillary: 122 mg/dL — ABNORMAL HIGH (ref 70–99)

## 2020-02-16 SURGERY — LEAD REVISION/REPAIR

## 2020-02-16 MED ORDER — CEFAZOLIN SODIUM-DEXTROSE 1-4 GM/50ML-% IV SOLN
1.0000 g | Freq: Four times a day (QID) | INTRAVENOUS | Status: DC
  Filled 2020-02-16 (×2): qty 50

## 2020-02-16 MED ORDER — LIDOCAINE HCL (PF) 1 % IJ SOLN
INTRAMUSCULAR | Status: DC | PRN
  Administered 2020-02-16: 45 mL

## 2020-02-16 MED ORDER — SODIUM CHLORIDE 0.9 % IV SOLN: INTRAVENOUS | Status: DC

## 2020-02-16 MED ORDER — HEPARIN (PORCINE) IN NACL 1000-0.9 UT/500ML-% IV SOLN
INTRAVENOUS | Status: AC
  Filled 2020-02-16: qty 500

## 2020-02-16 MED ORDER — CEFAZOLIN SODIUM-DEXTROSE 2-4 GM/100ML-% IV SOLN
2.0000 g | INTRAVENOUS | Status: AC
  Administered 2020-02-16: 2 g via INTRAVENOUS
  Filled 2020-02-16: qty 100

## 2020-02-16 MED ORDER — FENTANYL CITRATE (PF) 100 MCG/2ML IJ SOLN
INTRAMUSCULAR | Status: DC | PRN
  Administered 2020-02-16 (×2): 12.5 ug via INTRAVENOUS
  Administered 2020-02-16: 25 ug via INTRAVENOUS

## 2020-02-16 MED ORDER — MIDAZOLAM HCL 5 MG/5ML IJ SOLN
INTRAMUSCULAR | Status: DC | PRN
  Administered 2020-02-16 (×3): 1 mg via INTRAVENOUS

## 2020-02-16 MED ORDER — SODIUM CHLORIDE 0.9 % IV SOLN
80.0000 mg | INTRAVENOUS | Status: DC
  Filled 2020-02-16: qty 2

## 2020-02-16 MED ORDER — LIDOCAINE HCL 1 % IJ SOLN
INTRAMUSCULAR | Status: AC
  Filled 2020-02-16: qty 60

## 2020-02-16 MED ORDER — CEFAZOLIN SODIUM-DEXTROSE 2-4 GM/100ML-% IV SOLN
INTRAVENOUS | Status: AC
  Filled 2020-02-16: qty 100

## 2020-02-16 MED ORDER — HEPARIN (PORCINE) IN NACL 1000-0.9 UT/500ML-% IV SOLN
INTRAVENOUS | Status: DC | PRN
  Administered 2020-02-16: 500 mL

## 2020-02-16 MED ORDER — MIDAZOLAM HCL 5 MG/5ML IJ SOLN
INTRAMUSCULAR | Status: AC
  Filled 2020-02-16: qty 5

## 2020-02-16 MED ORDER — CHLORHEXIDINE GLUCONATE 4 % EX LIQD: 60.0000 mL | Freq: Once | CUTANEOUS | Status: DC

## 2020-02-16 MED ORDER — FENTANYL CITRATE (PF) 100 MCG/2ML IJ SOLN
INTRAMUSCULAR | Status: AC
  Filled 2020-02-16: qty 2

## 2020-02-16 SURGICAL SUPPLY — 8 items
CABLE SURGICAL S-101-97-12 (CABLE) ×2 IMPLANT
KIT MICROPUNCTURE NIT STIFF (SHEATH) ×1 IMPLANT
LEAD RELIANCE IS4 AF 64 0273 (Lead) ×1 IMPLANT
MAT PREVALON FULL STRYKER (MISCELLANEOUS) ×1 IMPLANT
PAD PRO RADIOLUCENT 2001M-C (PAD) ×2 IMPLANT
SHEATH 9FR PRELUDE SNAP 13 (SHEATH) ×1 IMPLANT
SHEATH PROBE COVER 6X72 (BAG) ×1 IMPLANT
TRAY PACEMAKER INSERTION (PACKS) ×2 IMPLANT

## 2020-02-16 NOTE — Progress Notes (Signed)
Pt. And wife at bedside waiting to be discharged. Pt. States he's waiting for CXR to be completed. Pt. States procedure completed at 1500.Per pt.  [employer] completed device assessment at 1700. Pt. Has order for CXR scheduled for am. Per Electrophysiology discharge summary, pt.to Discharge 4 hours post procedure pending completion of CXR and device check. New order placed for pt. To have CXR completed. Attending MD paged to make aware.

## 2020-02-16 NOTE — Progress Notes (Signed)
Progress Note  Patient Name: [name] [name] Date of Encounter: 02/16/2020  [facility] Cardiologist: Ida Rogue, MD   Subjective   Currently feeling well without chest pain or shortness of breath.  Ready for procedure this afternoon.  Inpatient Medications    Scheduled Meds: . carvedilol  6.25 mg Oral BID PC  . chlorhexidine  60 mL Topical Once  . chlorhexidine  60 mL Topical Once  . ezetimibe  10 mg Oral Daily  . gentamicin irrigation  80 mg Irrigation On Call  . hydrochlorothiazide  25 mg Oral Daily  . insulin aspart  0-9 Units Subcutaneous TID WC  . lisinopril  10 mg Oral Daily  . pantoprazole  40 mg Oral QAC breakfast  . rosuvastatin  20 mg Oral Daily  . spironolactone  25 mg Oral Daily  . tamsulosin  0.4 mg Oral Q lunch   Continuous Infusions: . sodium chloride    . sodium chloride    .  ceFAZolin (ANCEF) IV     PRN Meds: acetaminophen, nitroGLYCERIN, ondansetron (ZOFRAN) IV, traMADol   Vital Signs    Vitals:   02/15/20 2340 02/16/20 0401 02/16/20 0601 02/16/20 0742  BP: 131/88 114/72  131/74  Pulse: 64 71  64  Resp: 16 16  20   Temp: 97.8 F (36.6 C) 97.7 F (36.5 C)  97.6 F (36.4 C)  TempSrc:  Oral  Oral  SpO2: 96% 96%  96%  Weight:   88.9 kg   Height:   6\' 1"  (1.854 m)     Intake/Output Summary (Last 24 hours) at 02/16/2020 0811 Last data filed at 02/15/2020 2013 Gross per 24 hour  Intake 200 ml  Output --  Net 200 ml   Last 3 Weights 02/16/2020 02/15/2020 02/02/2020  Weight (lbs) 196 lb 198 lb 1.6 oz 198 lb  Weight (kg) 88.905 kg 89.858 kg 89.812 kg      Telemetry    Sinus rhythm- Personally Reviewed  ECG    Sinus rhythm- Personally Reviewed  Physical Exam   GEN: No acute distress.   Neck: No JVD Cardiac: RRR, no murmurs, rubs, or gallops.  Respiratory: Clear to auscultation bilaterally. GI: Soft, nontender, non-distended  MS: No edema; No deformity. Neuro:  Nonfocal  Psych: Normal affect   Labs    High Sensitivity  Troponin:  No results for input(s): TROPONINIHS in the last 720 hours.    Chemistry Recent Labs  Lab 02/16/20 0418  NA 138  K 3.6  CL 102  CO2 24  GLUCOSE 117*  BUN 17  CREATININE 0.74  CALCIUM 9.5  GFRNONAA >60  ANIONGAP 12     Hematology Recent Labs  Lab 02/16/20 0418  WBC 7.4  RBC 4.81  HGB 14.7  HCT 42.1  MCV 87.5  MCH 30.6  MCHC 34.9  RDW 13.9  PLT 158    BNPNo results for input(s): BNP, PROBNP in the last 168 hours.   DDimer No results for input(s): DDIMER in the last 168 hours.   Radiology    [facility] DEVICE CHECK  Result Date: 02/15/2020 Wound check appointment. Steri-strips removed. Wound without redness or edema. Incision edges approximated, wound well healed. Normal device function. Sensing and impedances consistent with implant measurements. RV threshold measurement ran and no capture noted at 7.5 V. Verified with Joey, [employer]oston rep. VP <1% of time. AChalmers Cater, PA-C made aware and verbal order to obtain CXR. Histogram distribution appropriate for patient and level of activity. No mode switches or ventricular  arrhythmias noted. Patient educated about wound care, arm mobility, lifting restrictions, shock plan. ROV in 3 months with implanting physician. Next remote 05/16/20.Lavenia Atlas, BSN, RN   Cardiac Studies   TTE 12/21/19 1. Left ventricular ejection fraction, by estimation, is 35 to 40%. The  left ventricle has moderately decreased function. The left ventricle  demonstrates regional wall motion abnormalities (see scoring  diagram/findings for description). The left  ventricular internal cavity size was mildly dilated. There is mild  asymmetric left ventricular hypertrophy of the septal segment. Left  ventricular diastolic parameters are indeterminate. There is akinesis of  the left ventricular, basal-mid inferior wall  and inferolateral wall.  2. Right ventricular systolic function is normal. The right ventricular  size is mildly  enlarged.  3. The mitral valve is normal in structure. Trivial mitral valve  regurgitation.  4. The aortic valve is tricuspid. There is moderate calcification of the  aortic valve. There is moderate thickening of the aortic valve. Aortic  valve regurgitation is not visualized. Mild aortic valve stenosis. Aortic  valve mean gradient measures 14.0  mmHg.  5. The inferior vena cava is normal in size with greater than 50%  respiratory variability, suggesting right atrial pressure of 3 mmHg.   Patient Profile     80 y.o. male with a history of ventricular tachycardia and chronic systolic heart failure status post [place] for secondary prevention presented to cardiology clinic with elevated RV thresholds and presented for lead revision.  Assessment & Plan    1.  ICD lead malfunction: Likely microdislodgment.  We Alene Bergerson plan for lead revision today.  Risk and benefits have been discussed include bleeding, tamponade, infection, pneumothorax.  The patient understands the risks and is agreed to the procedure.  2.  Chronic systolic heart failure secondary to coronary artery disease: Currently on Coreg, lisinopril, hydrochlorothiazide, Crestor.  Holding Plavix for procedure today.  Mersadie Kavanaugh likely restart pending hemostasis.  For questions or updates, please contact [place] Please consult [email or web address] for contact info under        Signed, Jeniel Slauson Meredith Leeds, MD  02/16/2020, 8:11 AM

## 2020-02-16 NOTE — H&P (View-Only) (Signed)
Progress Note  Patient Name: [name] [name] Date of Encounter: 02/16/2020  [facility] Cardiologist: Ida Rogue, MD   Subjective   Currently feeling well without chest pain or shortness of breath.  Ready for procedure this afternoon.  Inpatient Medications    Scheduled Meds: . carvedilol  6.25 mg Oral BID PC  . chlorhexidine  60 mL Topical Once  . chlorhexidine  60 mL Topical Once  . ezetimibe  10 mg Oral Daily  . gentamicin irrigation  80 mg Irrigation On Call  . hydrochlorothiazide  25 mg Oral Daily  . insulin aspart  0-9 Units Subcutaneous TID WC  . lisinopril  10 mg Oral Daily  . pantoprazole  40 mg Oral QAC breakfast  . rosuvastatin  20 mg Oral Daily  . spironolactone  25 mg Oral Daily  . tamsulosin  0.4 mg Oral Q lunch   Continuous Infusions: . sodium chloride    . sodium chloride    .  ceFAZolin (ANCEF) IV     PRN Meds: acetaminophen, nitroGLYCERIN, ondansetron (ZOFRAN) IV, traMADol   Vital Signs    Vitals:   02/15/20 2340 02/16/20 0401 02/16/20 0601 02/16/20 0742  BP: 131/88 114/72  131/74  Pulse: 64 71  64  Resp: 16 16  20   Temp: 97.8 F (36.6 C) 97.7 F (36.5 C)  97.6 F (36.4 C)  TempSrc:  Oral  Oral  SpO2: 96% 96%  96%  Weight:   88.9 kg   Height:   6\' 1"  (1.854 m)     Intake/Output Summary (Last 24 hours) at 02/16/2020 0811 Last data filed at 02/15/2020 2013 Gross per 24 hour  Intake 200 ml  Output --  Net 200 ml   Last 3 Weights 02/16/2020 02/15/2020 02/02/2020  Weight (lbs) 196 lb 198 lb 1.6 oz 198 lb  Weight (kg) 88.905 kg 89.858 kg 89.812 kg      Telemetry    Sinus rhythm- Personally Reviewed  ECG    Sinus rhythm- Personally Reviewed  Physical Exam   GEN: No acute distress.   Neck: No JVD Cardiac: RRR, no murmurs, rubs, or gallops.  Respiratory: Clear to auscultation bilaterally. GI: Soft, nontender, non-distended  MS: No edema; No deformity. Neuro:  Nonfocal  Psych: Normal affect   Labs    High Sensitivity  Troponin:  No results for input(s): TROPONINIHS in the last 720 hours.    Chemistry Recent Labs  Lab 02/16/20 0418  NA 138  K 3.6  CL 102  CO2 24  GLUCOSE 117*  BUN 17  CREATININE 0.74  CALCIUM 9.5  GFRNONAA >60  ANIONGAP 12     Hematology Recent Labs  Lab 02/16/20 0418  WBC 7.4  RBC 4.81  HGB 14.7  HCT 42.1  MCV 87.5  MCH 30.6  MCHC 34.9  RDW 13.9  PLT 158    BNPNo results for input(s): BNP, PROBNP in the last 168 hours.   DDimer No results for input(s): DDIMER in the last 168 hours.   Radiology    [facility] DEVICE CHECK  Result Date: 02/15/2020 Wound check appointment. Steri-strips removed. Wound without redness or edema. Incision edges approximated, wound well healed. Normal device function. Sensing and impedances consistent with implant measurements. RV threshold measurement ran and no capture noted at 7.5 V. Verified with Joey, [employer] rep. VP <1% of time. AChalmers Cater, PA-C made aware and verbal order to obtain CXR. Histogram distribution appropriate for patient and level of activity. No mode switches or ventricular  arrhythmias noted. Patient educated about wound care, arm mobility, lifting restrictions, shock plan. ROV in 3 months with implanting physician. Next remote 05/16/20.Leigh Wallace, BSN, RN   Cardiac Studies   TTE 12/21/19 1. Left ventricular ejection fraction, by estimation, is 35 to 40%. The  left ventricle has moderately decreased function. The left ventricle  demonstrates regional wall motion abnormalities (see scoring  diagram/findings for description). The left  ventricular internal cavity size was mildly dilated. There is mild  asymmetric left ventricular hypertrophy of the septal segment. Left  ventricular diastolic parameters are indeterminate. There is akinesis of  the left ventricular, basal-mid inferior wall  and inferolateral wall.  2. Right ventricular systolic function is normal. The right ventricular  size is mildly  enlarged.  3. The mitral valve is normal in structure. Trivial mitral valve  regurgitation.  4. The aortic valve is tricuspid. There is moderate calcification of the  aortic valve. There is moderate thickening of the aortic valve. Aortic  valve regurgitation is not visualized. Mild aortic valve stenosis. Aortic  valve mean gradient measures 14.0  mmHg.  5. The inferior vena cava is normal in size with greater than 50%  respiratory variability, suggesting right atrial pressure of 3 mmHg.   Patient Profile     80 y.o. male with a history of ventricular tachycardia and chronic systolic heart failure status post [employer] ICD for secondary prevention presented to cardiology clinic with elevated RV thresholds and presented for lead revision.  Assessment & Plan    1.  ICD lead malfunction: Likely microdislodgment.  We Brizeyda Holtmeyer plan for lead revision today.  Risk and benefits have been discussed include bleeding, tamponade, infection, pneumothorax.  The patient understands the risks and is agreed to the procedure.  2.  Chronic systolic heart failure secondary to coronary artery disease: Currently on Coreg, lisinopril, hydrochlorothiazide, Crestor.  Holding Plavix for procedure today.  Cayley Pester likely restart pending hemostasis.  For questions or updates, please contact [facility] Please consult [email or web address] for contact info under        Signed, Geovani Tootle Martin Latrece Nitta, MD  02/16/2020, 8:11 AM    

## 2020-02-16 NOTE — Interval H&P Note (Signed)
History and Physical Interval Note:  02/16/2020 8:13 AM  [name] [name]  has presented today for surgery, with the diagnosis of bad lead.  The various methods of treatment have been discussed with the patient and family. After consideration of risks, benefits and other options for treatment, the patient has consented to  Procedure(s): LEAD REVISION/REPAIR (N/A) as a surgical intervention.  The patient's history has been reviewed, patient examined, no change in status, stable for surgery.  I have reviewed the patient's chart and labs.  Questions were answered to the patient's satisfaction.     Brandilee Pies [employer]

## 2020-02-16 NOTE — Discharge Summary (Addendum)
ELECTROPHYSIOLOGY PROCEDURE DISCHARGE SUMMARY    Patient ID: [name] [name],  MRN: [id number], DOB/AGE: Apr 30, 1940 80 y.o.  Admit date: 02/15/2020 Discharge date: 02/16/2020  Primary Care Physician: [name], [name]  Primary Cardiologist: Dr. Rockey Situ Electrophysiologist: Dr. Quentin Ore  Primary Discharge Diagnosis:  1. ICD lead dislodgement  Secondary Discharge Diagnosis:  1. CAD 2. ICM 3. VT 4. DM 5. HTN 6. LV mural thrombus     Resolved       Allergies  Allergen Reactions   Metformin And Related Diarrhea and Other (See Comments)    Leg cramps, also   Azithromycin Other (See Comments)    Not recommended - no reaction   Other Other (See Comments)    If taking antibiotics for awhile, thrush results   Percocet [Oxycodone-Acetaminophen] Other (See Comments)    Hallucinations   Jardiance [Empagliflozin] Other (See Comments)    Tongue itching/reaction     Procedures This Admission:  02/16/20: ICD lead revision             See full report for details  Brief HPI: [name] [name] is a 80 y.o. male with PMHx that includes above was seen in the office for wound check visit where was noted his RV lead thresholds ahd risen significantly, was decided to pursue lead revision with suspect microdislodgement.  He had no symptoms of perforation/pericarditis.  Hospital Course:  The patient was brought to the hospital through short stay a quick look echo done by Dr. Quentin Ore not show pericardial effusion.  COVID testing result caused delay into late day and was elected to do lead revision the following day. He underwent RV lead revision with details as outlined above.   Wound care, arm mobility, and restrictions were re-reviewed with the patient.   In d/w Dr. Curt Bears, expectation [name] [name] be to discharge 4 hours post procedure, once CXR and device check are completed and cleared by Dr. Curt Bears  Dr. Quentin Ore noted his eliquis 2/2 LV thrombus that had since resolved and discontinued  it.   Physical Exam: Vitals:   02/16/20 0401 02/16/20 0601 02/16/20 0742 02/16/20 1213  BP: 114/72  131/74 94/81  Pulse: 71  64 69  Resp: 16  20 20   Temp: 97.7 F (36.5 C)  97.6 F (36.4 C) 97.9 F (36.6 C)  TempSrc: Oral  Oral Oral  SpO2: 96%  96% 97%  Weight:  88.9 kg    Height:  6\' 1"  (1.854 m)      Labs:   Lab Results  Component Value Date   WBC 7.4 02/16/2020   HGB 14.7 02/16/2020   HCT 42.1 02/16/2020   MCV 87.5 02/16/2020   PLT 158 02/16/2020    Recent Labs  Lab 02/16/20 0418  NA 138  K 3.6  CL 102  CO2 24  BUN 17  CREATININE 0.74  CALCIUM 9.5  GLUCOSE 117*    Discharge Medications:  Allergies as of 02/16/2020       Reactions   Metformin And Related Diarrhea, Other (See Comments)   Leg cramps, also   Azithromycin Other (See Comments)   Not recommended - no reaction   Other Other (See Comments)   If taking antibiotics for awhile, thrush results   Percocet [oxycodone-acetaminophen] Other (See Comments)   Hallucinations   Jardiance [empagliflozin] Other (See Comments)   Tongue itching/reaction        Medication List     TAKE these medications    acetaminophen 500 MG tablet Commonly known as: TYLENOL  Take 1,000 mg by mouth every 6 (six) hours as needed (pain.).   carvedilol 6.25 MG tablet Commonly known as: COREG Take 1 tablet (6.25 mg total) by mouth 2 (two) times daily.   clopidogrel 75 MG tablet Commonly known as: PLAVIX TAKE 1 TABLET(75 MG TOTAL) BY MOUTH DAILY WITH BREAKFAST What changed: See the new instructions. Notes to patient: Next dose/resume 02/17/20   ezetimibe 10 MG tablet Commonly known as: ZETIA TAKE 1 TABLET(10 MG) BY MOUTH DAILY   Fish Oil 1000 MG Caps Take 1,000 mg by mouth daily with lunch.   fluticasone 50 MCG/ACT nasal spray Commonly known as: FLONASE Place 2 sprays into both nostrils daily. What changed:  when to take this reasons to take this   glipiZIDE 10 MG 24 hr tablet Commonly known as: GLUCOTROL  XL Take 1 tablet (10 mg total) by mouth daily with breakfast.   hydrochlorothiazide 25 MG tablet Commonly known as: HYDRODIURIL TAKE 1 TABLET(25 MG) BY MOUTH DAILY What changed: See the new instructions.   lisinopril 10 MG tablet Commonly known as: ZESTRIL TAKE 1 TABLET BY MOUTH DAILY What changed: when to take this   multivitamin with minerals Tabs tablet Take 1 tablet by mouth daily with lunch.   MURINE TEARS FOR DRY EYES OP Place 1-2 drops into both eyes as needed (for dry eyes).   nitroGLYCERIN 0.4 MG SL tablet Commonly known as: NITROSTAT Place 1 tablet (0.4 mg total) under the tongue every 5 (five) minutes x 3 doses as needed for chest pain.   OneTouch Verio test strip Generic drug: glucose blood 2 (two) times daily.   Ozempic (1 MG/DOSE) 4 MG/3ML Sopn Generic drug: Semaglutide (1 MG/DOSE) ADMINISTER 1 MG UNDER THE SKIN 1 TIME A WEEK What changed:  how much to take how to take this when to take this additional instructions   pantoprazole 40 MG tablet Commonly known as: PROTONIX Take 1 tablet (40 mg total) by mouth daily before breakfast.   PROBIOTIC PO Take 1 capsule by mouth every evening.   psyllium 58.6 % powder Commonly known as: METAMUCIL Take 1 packet by mouth at bedtime.   rosuvastatin 20 MG tablet Commonly known as: CRESTOR Take 1 tablet (20 mg total) by mouth daily. What changed: when to take this   sodium chloride 0.65 % Soln nasal spray Commonly known as: OCEAN Place 1 spray into both nostrils as needed for congestion.   spironolactone 25 MG tablet Commonly known as: ALDACTONE Take 1 tablet (25 mg total) by mouth daily. What changed: when to take this   tamsulosin 0.4 MG Caps capsule Commonly known as: FLOMAX TAKE 1 CAPSULE(0.4 MG) BY MOUTH DAILY What changed:  how much to take how to take this when to take this additional instructions   traMADol 50 MG tablet Commonly known as: ULTRAM TAKE 1 TABLET BY MOUTH EVERY 12 HOURS AS  NEEDED What changed: when to take this        Disposition:  home    Follow-up Information     [facility] Follow up.   Specialty: Cardiology Why: 03/02/20 @ 12:00PM (noon), wound check visit Contact information: [address]        Vickie Epley, MD Follow up.   Specialties: Cardiology, Radiology Why: 05/17/20 @ 11:20AM Contact information: [place] [address] [phone]  Duration of Discharge Encounter: Greater than 30 minutes including physician time.  SignedTommye Standard, PA-C 02/16/2020 2:30 PM   I have seen and examined this patient with Tommye Standard.  Agree with above, note added to reflect my findings.  On exam, RRR, no murmurs.  Patient status post ICD lead revision.  Chest x-ray and interrogation without issues.  Plan for discharge today with follow-up in clinic.  Lior Hoen M. Sondra Blixt MD 02/17/2020 8:21 AM

## 2020-02-16 NOTE — Discharge Instructions (Signed)
After Your ICD (Implantable Cardiac Defibrillator)       You have a [employer] ICD   Send a device transmission tomorrow   OK to resume your Plavix tomorrow 02/17/20   Do not lift your arm above shoulder height for 1 week after your procedure. After 7 days, you may progress as below.     Wednesday February 23, 2020  Thursday February 24, 2020 Friday February 25, 2020 Saturday February 26, 2020    Do not lift, push, pull, or carry anything over 10 pounds with the affected arm until 6 weeks after your procedure.    Monitor your defibrillator site for redness, swelling, and drainage. Call the device clinic at [phone] if you experience these symptoms or fever/chills.   your incision is closed with Steri-strips:  Do not get the area wet, no showers until cleared to at your wound check visit. Avoid lotions, ointments, or perfumes over your incision until it is well-healed.   You may use a hot tub or a pool AFTER your wound check appointment if the incision is completely closed.    Your ICD is designed to protect you from life threatening heart rhythms. Because of this, you may receive a shock.   o 1 shock with no symptoms:  Call the office during business hours. o 1 shock with symptoms (chest pain, chest pressure, dizziness, lightheadedness, shortness of breath, overall feeling unwell):  Call 911. o If you experience 2 or more shocks in 24 hours:  Call 911. o If you receive a shock, you should not drive for 6 months per the [place] DMV IF you receive appropriate therapy from your ICD.    ICD Alerts:  Some alerts are vibratory and others beep. These are NOT emergencies. Please call our office to let us know. If this occurs at night or on weekends, it can wait until the next business day. Send a remote transmission.   If your device is capable of reading fluid status (for heart failure), you will be offered monthly monitoring to review this with you.    Remote monitoring  is used to monitor your ICD from home. This monitoring is scheduled every 91 days by our office. It allows [place] to keep an eye on the functioning of your device to ensure it is working properly. You will routinely see your Electrophysiologist annually (more often if necessary).

## 2020-02-16 NOTE — Progress Notes (Signed)
Pt. With orders to d/c home. Discharge orders reviewed with pt. And wife. VS WNL. Pt. Alert and stable. CCMD notified of pts. Discharge.

## 2020-02-17 ENCOUNTER — Telehealth: Payer: Self-pay

## 2020-02-17 ENCOUNTER — Encounter (HOSPITAL_COMMUNITY): Payer: Self-pay | Admitting: Cardiology

## 2020-02-17 ENCOUNTER — Telehealth: Payer: Self-pay | Admitting: Cardiology

## 2020-02-17 MED FILL — Lidocaine HCl Local Inj 1%: INTRAMUSCULAR | Qty: 60 | Status: AC

## 2020-02-17 NOTE — Telephone Encounter (Signed)
Patient calling  Was released late from procedure yesterday and would like to clarify instructions  Would also like to know about antibiotic medication he was to receive Please call to discuss

## 2020-02-17 NOTE — Telephone Encounter (Signed)
Patient came in today and picked up his medication. Maylani Embree,cma

## 2020-02-17 NOTE — Telephone Encounter (Signed)
Patient notified that his PA 4 boxes of Ozempic is here in office. He stated his wife will pick up today or tomorrow.

## 2020-02-17 NOTE — Telephone Encounter (Signed)
Patient called and states he thought he needed anti-biotics prior to leaving the hospital after gen change. I spoke to Dr. Curt Bears and states patient received IV anti-biotics prior/during procedure and he does not need additional. Advised patient to call with further questions or concerns. Verbalized understanding.

## 2020-02-18 ENCOUNTER — Telehealth: Payer: Self-pay

## 2020-02-18 NOTE — Telephone Encounter (Signed)
Patient confirmed hospital follow up with Cardiology scheduled. Nurse encourages to call the office as needed for care. No transition of care appointment scheduled with pcp at this time.

## 2020-02-22 LAB — PACEMAKER DEVICE OBSERVATION

## 2020-02-24 ENCOUNTER — Ambulatory Visit: Payer: Medicare Other | Admitting: Pharmacist

## 2020-02-24 DIAGNOSIS — E1165 Type 2 diabetes mellitus with hyperglycemia: Secondary | ICD-10-CM

## 2020-02-24 DIAGNOSIS — I10 Essential (primary) hypertension: Secondary | ICD-10-CM

## 2020-02-24 DIAGNOSIS — E782 Mixed hyperlipidemia: Secondary | ICD-10-CM

## 2020-02-24 DIAGNOSIS — G8929 Other chronic pain: Secondary | ICD-10-CM

## 2020-02-24 DIAGNOSIS — I25118 Atherosclerotic heart disease of native coronary artery with other forms of angina pectoris: Secondary | ICD-10-CM

## 2020-02-24 DIAGNOSIS — N400 Enlarged prostate without lower urinary tract symptoms: Secondary | ICD-10-CM

## 2020-02-24 NOTE — Chronic Care Management (AMB) (Addendum)
Chronic Care Management Pharmacy Note  02/24/2020 Name:  [name] [name] MRN:  [id number] DOB:  1940/09/24  Subjective: [name] [name] is an 80 y.o. year old male who is a primary patient of Vidal Schwalbe, [name], [name].  The CCM team was consulted for assistance with disease management and care coordination needs.    Engaged with patient by telephone for follow up visit in response to provider referral for pharmacy case management and/or care coordination services.   Consent to Services:  The patient was given information about Chronic Care Management services, agreed to services, and gave verbal consent prior to initiation of services.  Please see initial visit note for detailed documentation.   Objective:  Lab Results  Component Value Date   CREATININE 0.74 02/16/2020   CREATININE 0.80 01/31/2020   CREATININE 0.88 12/21/2019    Lab Results  Component Value Date   HGBA1C 7.1 (A) 12/22/2019       Component Value Date/Time   CHOL 124 12/21/2019 0919   TRIG 267 (H) 12/21/2019 0919   HDL 36 (L) 12/21/2019 0919   CHOLHDL 3.4 12/21/2019 0919   VLDL 53 (H) 12/21/2019 0919   LDLCALC 35 12/21/2019 0919   LDLDIRECT 55.0 02/10/2019 0903    Clinical ASCVD: Yes  The ASCVD Risk score (Goff DC Jr., et al., 2013) failed to calculate for the following reasons:   The 2013 ASCVD risk score is only valid for ages 74 to 44   The patient has a prior MI or stroke diagnosis     BP Readings from Last 3 Encounters:  02/16/20 134/88  02/02/20 127/82  12/28/19 102/64    Assessment: Review of patient past medical history, allergies, medications, health status, including review of consultants reports, laboratory and other test data, was performed as part of comprehensive evaluation and provision of chronic care management services.   SDOH:  (Social Determinants of Health) assessments and interventions performed:  SDOH Interventions   Flowsheet Row Most Recent Value  SDOH Interventions    Financial Strain Interventions Other (Comment)  [manufacturer assistance]      CCM Care Plan  Allergies  Allergen Reactions  . Metformin And Related Diarrhea and Other (See Comments)    Leg cramps, also  . Azithromycin Other (See Comments)    Not recommended - no reaction  . Other Other (See Comments)    If taking antibiotics for awhile, thrush results  . Percocet [Oxycodone-Acetaminophen] Other (See Comments)    Hallucinations  . Jardiance [Empagliflozin] Other (See Comments)    Tongue itching/reaction    Medications Reviewed Today    Reviewed by De Hollingshead, RPH-CPP (Pharmacist) on 02/24/20 at 1139  Med List Status: <None>  Medication Order Taking? Sig Documenting Provider Last Dose Status Informant  acetaminophen (TYLENOL) 500 MG tablet [id number] Yes Take 1,000 mg by mouth every 6 (six) hours as needed (pain.). [provider] Taking Active Self  carvedilol (COREG) 6.25 MG tablet [id number] Yes Take 1 tablet (6.25 mg total) by mouth 2 (two) times daily. Vickie Epley, MD Taking Active Self  clopidogrel (PLAVIX) 75 MG tablet [id number] Yes TAKE 1 TABLET(75 MG TOTAL) BY MOUTH DAILY WITH BREAKFAST [name], [name], MD Taking Active   ezetimibe (ZETIA) 10 MG tablet [id number] Yes TAKE 1 TABLET(10 MG) BY MOUTH DAILY [name], [name], MD Taking Active   fluticasone (FLONASE) 50 MCG/ACT nasal spray [id number] Yes Place 2 sprays into both nostrils daily.  Patient taking differently: Place 2 sprays into both nostrils daily as needed  for allergies.   Thersa Salt G, DO Taking Active   glipiZIDE (GLUCOTROL XL) 10 MG 24 hr tablet [id number] Yes Take 1 tablet (10 mg total) by mouth daily with breakfast. [name], [name] Taking Active   hydrochlorothiazide (HYDRODIURIL) 25 MG tablet [id number] Yes TAKE 1 TABLET(25 MG) BY MOUTH DAILY [name], [name] Taking Active   lisinopril (ZESTRIL) 10 MG tablet [id number] Yes TAKE 1 TABLET BY MOUTH DAILY [name],  [name] Taking Active   Multiple Vitamin (MULTIVITAMIN WITH MINERALS) TABS tablet [id number] Yes Take 1 tablet by mouth daily with lunch. [provider] Taking Active Self  nitroGLYCERIN (NITROSTAT) 0.4 MG SL tablet [id number]  Place 1 tablet (0.4 mg total) under the tongue every 5 (five) minutes x 3 doses as needed for chest pain. [name], NP  Active Self  Omega-3 Fatty Acids (FISH OIL) 1000 MG CAPS [id number] Yes Take 1,000 mg by mouth daily with lunch. [provider] Taking Active Self  ONETOUCH VERIO test strip [id number] Yes 2 (two) times daily. [provider] Taking Active Self  pantoprazole (PROTONIX) 40 MG tablet [id number] Yes Take 1 tablet (40 mg total) by mouth daily before breakfast. [name], MD Taking Active Self  Polyvinyl Alcohol-Povidone (MURINE TEARS FOR DRY EYES OP) [id number] Yes Place 1-2 drops into both eyes as needed (for dry eyes).  [provider] Taking Active Self  Probiotic Product (PROBIOTIC PO) [id number] Yes Take 1 capsule by mouth every evening. [provider] Taking Active Self  psyllium (METAMUCIL) 58.6 % powder [id number] Yes Take 1 packet by mouth at bedtime. [provider] Taking Active Self           Med Note Duffy [name], [name]   Wed Jul 07, 2019  6:13 PM)    rosuvastatin (CRESTOR) 20 MG tablet [id number] Yes Take 1 tablet (20 mg total) by mouth daily. Vickie Epley, MD Taking Expired 01/11/20 2359            Med Note Darnelle Maffucci, Kaylene Dawn E   Thu Feb 24, 2020 11:36 AM) Taking 10 mg daily   Semaglutide, 1 MG/DOSE, (OZEMPIC, 1 MG/DOSE,) 4 MG/3ML SOPN [id number] Yes ADMINISTER 1 MG UNDER THE SKIN 1 TIME A WEEK  Patient taking differently: Inject 1 mg into the skin every Sunday.   [name], [name] Taking Active   sodium chloride (OCEAN) 0.65 % SOLN nasal spray [id number] Yes Place 1 spray into both nostrils as needed for congestion. [provider] Taking Active Self  spironolactone (ALDACTONE)  25 MG tablet [id number] Yes Take 1 tablet (25 mg total) by mouth daily.  Patient taking differently: Take 25 mg by mouth daily with lunch.   [name], MD Taking Active   tamsulosin (FLOMAX) 0.4 MG CAPS capsule [id number] Yes TAKE 1 CAPSULE(0.4 MG) BY MOUTH DAILY [name], MD Taking Active   traMADol (ULTRAM) 50 MG tablet [id number]  TAKE 1 TABLET BY MOUTH EVERY 12 HOURS AS NEEDED  Patient taking differently: Take 50 mg by mouth in the morning and at bedtime.   [name], [name]  Active           Patient Active Problem List   Diagnosis Date Noted  . Malfunction of implantable defibrillator ventricular (ICD) lead 02/15/2020  . Cardiomyopathy ([place])   . Chronic systolic heart failure ([place]) 02/02/2020  . Mural thrombus of left ventricle 02/02/2020  . ICD (implantable cardioverter-defibrillator) in place 02/02/2020  . Ischemic cardiomyopathy   .  VT (ventricular tachycardia) ([place]) 07/09/2019  . NSTEMI (non-ST elevated myocardial infarction) ([place]) 07/07/2019  . De Quervain's tenosynovitis, left 05/17/2019  . Esophagitis, unspecified without bleeding   . Esophageal ulcer 11/20/2018  . Acute left flank pain 10/23/2018  . Post-nasal drip 10/23/2018  . Allergic rhinitis 10/23/2018  . Right shoulder pain 07/03/2018  . Skin lesion 06/12/2018  . Pruritus 06/12/2018  . Kidney stones, calcium oxalate 05/28/2018  . Atherosclerosis of native coronary artery with stable angina pectoris ([place]) 05/17/2018  . GERD (gastroesophageal reflux disease) 02/23/2018  . SCC (squamous cell carcinoma) 05/15/2017  . Cervical stenosis of spine 05/15/2017  . Tobacco abuse 01/26/2017  . Cardiac murmur 01/08/2017  . Right renal mass 10/31/2016  . Abdominal pain 10/31/2016  . Fatty liver 10/31/2016  . Hematuria 10/31/2016  . Acute non-recurrent maxillary sinusitis 10/17/2015  . Carotid artery disease ([place]) 10/03/2015  . Atherosclerosis of abdominal aorta ([place]) 07/28/2015  . CAD (coronary  artery disease) 07/10/2015  . Essential hypertension 07/10/2015  . Mixed hyperlipidemia 07/10/2015  . Controlled type 2 diabetes mellitus with complication, without long-term current use of insulin ([place]) 07/10/2015  . History of melanoma 07/10/2015  . Chronic low back pain 07/10/2015  . BPH (benign prostatic hyperplasia) 07/10/2015  . Bilateral hand numbness 07/10/2015  . Insomnia 07/10/2015    Conditions to be addressed/monitored: CAD, HTN, HLD and DMII  Care Plan : Medication Management  Updates made by De Hollingshead, RPH-CPP since 02/24/2020 12:00 AM    Problem: Diabetes, CAD, HTN, HLD, AFib     Long-Range Goal: Disease Progression Prevention   This Visit's Progress: On track  Recent Progress: On track  Priority: High  Note:   Current Barriers:  . Unable to independently afford treatment regimen . Unable to achieve control of diabetes   Pharmacist Clinical Goal(s):  [name] Over the next 90 days, patient will verbalize ability to afford treatment regimen . Over the next 90 days, patient will maintain control of diabetes as evidenced by A1c  through collaboration with PharmD and provider.   Interventions: . 1:1 collaboration with [name], [name] regarding development and update of comprehensive plan of care as evidenced by provider attestation and co-signature . Inter-disciplinary care team collaboration (see longitudinal plan of care) . Comprehensive medication review performed; medication list updated in electronic medical record  Diabetes: . Uncontrolled; current treatment: Ozempic 1 mg weekly, glipizide 10 mg QAM  o Has been unable to tolerate metformin, IR and ER, d/t diarrhea.  o Did not tolerate Jardiance d/t urinary symptoms . Reports more stress over the past few weeks with cardiology concerns.  . Denies episodes of hypoglycemia . Current glucose readings: fastings: has not been checking lately d/t recent cardiac stressors, procedures,  admissions . Reapproved for patient assistance for Ozempic through [employer] for 2022.  [name] Recommend to continue current regimen at this time.  . Encouraged to resume checking fasting and 2 hour post prandial glucose readings.   Hypertension, Ventricular Ectopy, s/p . Controlled; current treatment: carvedilol 6.25 mg BID, spironolactone 25 mg QAM, lisinopril 10 mg QAM, HCTZ 25 mg daily. . S/p ICD placement: Primary cardiologist Dr. Rockey Situ; EP Dr. Quentin Ore.  . Home BP reading: 120-130/70-80; HR 70s . Reports that leads were not connected correctly, had to be redone.  . Recommend to continue current regimen with cardiology collaboration at this time.   Hyperlipidemia and ASCVD risk reduction: [name] Very well controlled on last lab work, but not checked on current regimen; current treatment: rosuvastatin  10 mg daily (patient unsure if he self-reduced or if someone instructed him to reduce to 10 mg daily as he was also having "muscle cramps"), ezetimibe 10 mg daily   . Antiplatelet regimen: clopidogrel 75 mg daily  . Reviewed importance of adherence to prescribed regimen. Reviewed that next time he fills rosuvastatin (which will be his first fill in 2022), he should fill the rosuvastatin 10 mg daily dose. Recheck lipid panel with next appointments in March (cardiology or PCP) . Recommend to continue current regimen at this time.   BPH: . Controlled per patient report; current regimen: tamsulosin 0.4 mg daily . Recommend to continue current regimen at this time  GERD: . Controlled per patient report; current regimen: pantoprazole 40 mg daily . Continue current regimen at this time  Patient Goals/Self-Care Activities . Over the next 90 days, patient will:  - take medications as prescribed check glucose daily, document, and provide at future appointments check blood pressure daily, document, and provide at future appointments collaborate with provider on medication access solutions  Follow Up  Plan: Telephone follow up appointment with care management team member scheduled for: ~ 8 weeks      Medication Assistance: Ozempic obtained through [employer] medication assistance program.  Enrollment ends 12/13/20  Follow Up:  Patient agrees to Care Plan and Follow-up.  Plan: Telephone follow up appointment with care management team member scheduled for:  ~ 8 weeks  [name], PharmD, [place], CPP Clinical Pharmacist [place] [place]   I have collaborated with the care management provider regarding care management and care coordination activities outlined in this encounter and have reviewed this encounter including documentation in the note and care plan. I am certifying that I agree with the content of this note and encounter as primary care provider.   [name], NP

## 2020-02-24 NOTE — Patient Instructions (Signed)
Visit Information  PATIENT GOALS:  Goals Addressed               This Visit's Progress     Patient Stated     Medication Monitoring (pt-stated)        Patient Goals/Self-Care Activities Over the next 90 days, patient will:  - take medications as prescribed check glucose daily, document, and provide at future appointments check blood pressure daily, document, and provide at future appointments collaborate with provider on medication access solutions        Patient verbalizes understanding of instructions provided today and agrees to view in MyChart.    Plan: Telephone follow up appointment with care management team member scheduled for:  ~8 weeks  Catie Travis, PharmD, BCACP, CPP Clinical Pharmacist [facility] [place] [phone] 

## 2020-03-02 ENCOUNTER — Other Ambulatory Visit: Payer: Self-pay

## 2020-03-02 ENCOUNTER — Ambulatory Visit (INDEPENDENT_AMBULATORY_CARE_PROVIDER_SITE_OTHER): Payer: Medicare Other | Admitting: Emergency Medicine

## 2020-03-02 DIAGNOSIS — I472 Ventricular tachycardia, unspecified: Secondary | ICD-10-CM

## 2020-03-02 DIAGNOSIS — I429 Cardiomyopathy, unspecified: Secondary | ICD-10-CM

## 2020-03-02 LAB — CUP PACEART INCLINIC DEVICE CHECK
Brady Statistic RV Percent Paced: 1 % — CL
Date Time Interrogation Session: 20220217122750
HighPow Impedance: 76 Ohm
Implantable Lead Implant Date: 20220202
Implantable Lead Location: 753860
Implantable Lead Model: 273
Implantable Lead Serial Number: 111255
Implantable Pulse Generator Implant Date: 20220119
Lead Channel Impedance Value: 425 Ohm
Lead Channel Pacing Threshold Amplitude: 0.7 V
Lead Channel Pacing Threshold Pulse Width: 0.4 ms
Lead Channel Sensing Intrinsic Amplitude: 25 mV
Lead Channel Setting Pacing Amplitude: 3.5 V
Lead Channel Setting Pacing Pulse Width: 0.4 ms
Lead Channel Setting Sensing Sensitivity: 0.5 mV
Pulse Gen Serial Number: 291993

## 2020-03-02 NOTE — Progress Notes (Signed)
Wound check appointment. Steri-strips removed. Wound without redness or edema. Incision edges approximated, wound well healed. Normal device function. Thresholds, sensing, and impedances consistent with implant measurements. Device programmed at 3.5V for extra safety margin until 3 month visit. Histogram distribution appropriate for patient and level of activity. No ventricular arrhythmias noted. Patient educated about wound care, arm mobility, lifting restrictions, shock plan. ROV in 3 months with Dr. Quentin Ore.

## 2020-03-10 ENCOUNTER — Ambulatory Visit: Payer: Medicare Other | Admitting: Cardiovascular Disease

## 2020-03-11 ENCOUNTER — Other Ambulatory Visit: Payer: Self-pay | Admitting: Family

## 2020-03-11 DIAGNOSIS — M549 Dorsalgia, unspecified: Secondary | ICD-10-CM

## 2020-03-11 DIAGNOSIS — G8929 Other chronic pain: Secondary | ICD-10-CM

## 2020-03-19 NOTE — Progress Notes (Unsigned)
Date:  03/20/2020   ID:  [name] [name], DOB July 12, 1940, MRN [id number]  Patient Location:  [address] [place] [address]   Provider location:   [facility], [place] office  PCP:  [name], [name]  Cardiologist:  Arvid Right [facility]  Chief Complaint  Patient presents with  . Other    3 month f/u discuss future dr appointments being scheduled and inspire sleep apnea device. Meds reviewed verbally with pt.     History of Present Illness:    [name] [name] is a 80 y.o. male  past medical history of CAD ,NSTEMI,  stenting was 33, and  2005 , VT followed by EP DM II HTN Hyperlipidemia Melanoma Abdominal aorta atherosclerosis April 2021 :atherectomy, drug-eluting stent  STENT RESOLUTE ONYX 4.5X18, postdilated LAD and angioplasty of diagonal Who presents for routine follow-up of his coronary artery disease  LOV with myself 11/2019 Significant events over the past 8 months  Cath 06/2019:  Catheterization chronically occluded RCA collaterals from left to right, Severe proximal LAD disease 80%, stent placed. Underwent angioplasty of diagonal vessel, 75% lesion Patent stent in the ramus  Echo 12/2019 Left ventricular ejection fraction, by estimation, is 35 to 40%.\ ICD implant 02/02/2020  RV lead thresholds ahd risen significantly, was decided to pursue lead revision with suspect microdislodgement.  Echocardiogram December 2021 Ejection fraction 35 to 40%  Dr. Quentin Ore noted his eliquis 2/2 LV thrombus that had since resolved and discontinued it.  Low blood pressure over weekend x1, 90 systolic, better with fluids Feels well otherwise, walks daily  Lab work reviewed, labs were 2022 hemoglobin A1c 7.1 Total cholesterol 124 LDL Normal CBC, BMP  Other past medical hx  Non-STEMI June 2021: Hospital Catheterization chronically occluded RCA collaterals from left to right Severe proximal LAD disease 80%, stent placed Angioplasty of  diagonal vessel, 75% lesion  ZIO monitor with nonsustained VT, PVCs 10% burden  Cardiac MRI Ejection fraction 35% LGE in the basal and mid inferior and inferior lateral walls, dyskinesis of the basal and inferior inferolateral walls and a left ventricular mural thrombus located on the inferolateral wall.   Prior CV studies:   The following studies were reviewed today:  Pharmacologic nuclear stress test 03/26/2013,Lexiscan Old inferior MI. No ischemia. Well-preserved LVEF 50%. Slight hypokinesia of the inferior wall.  Echo 03/26/2013: LV borderline dilated. EF 50-55%. Mild global hypokinesis of LV. LA is moderately dilated. RV mildly dilated. Mild to moderate aortic sclerosis. Mild AI. Mild to moderate mitral valve thickening. Mild to moderate MR. RVSP 31 mmHg. Mild TR. Aortic root is normal in size. No pericardial effusion.   Past Medical History:  Diagnosis Date  . Aortic insufficiency    a. noted on TTE 2015; b. 06/2019 Echo: AI not visualized.  . Arthritis   . CAD (coronary artery disease)    a. remote PCI in 1991 and 2005; b. MV 3/15: old inferior MI, no ischemia, LVEF 50%, slight inferior wall hypokniesis; c. 06/2019 NSTEMI/PCI: LM nl, LAD 80p/m (Atherectomy & 4.5x18 Resolute Onyx DES), 46m/d, D1 75 (PTCA), RI patent stent, LCX nl, RCA 100p, RPAV fills via L->R collats from LCX.  [name] Chicken pox   . Colon polyps    4 pre-cancerous   . Diverticulitis   . DM type 2 (diabetes mellitus, type 2) ([place])   . Family history of adverse reaction to anesthesia   . GERD (gastroesophageal reflux disease)   . Heart murmur   . History of kidney  stones   . HOH (hard of hearing)   . Hyperlipidemia   . Hypertension   . Ischemic cardiomyopathy    a. TTE 2015: EF  50-55%, mild global HK; b. 06/2019 Echo: EF 45-50%, Gr1 DD, basal inf AK. Triv MR.  . Kidney stones   . Melanoma ([place]) 1980   Resected from his back  . Mitral regurgitation    a. noted on TTE 2015  . Myocardial infarction [facility])     Past Surgical History:  Procedure Laterality Date  . CHOLECYSTECTOMY  2012  . COLONOSCOPY     in 2003 with polyp removed and leak anastomosis had to have open abdominal surgery   . CORONARY ANGIOPLASTY WITH STENT PLACEMENT  1991 & 2005  . CORONARY ATHERECTOMY N/A 07/08/2019   Procedure: CORONARY ATHERECTOMY;  Surgeon: Jettie Booze, MD;  Location: [place] [facility];  Service: Cardiovascular;  Laterality: N/A;  . CORONARY BALLOON ANGIOPLASTY N/A 07/08/2019   Procedure: CORONARY BALLOON ANGIOPLASTY;  Surgeon: Jettie Booze, MD;  Location: [place] [facility];  Service: Cardiovascular;  Laterality: N/A;  diagonal   . CORONARY STENT INTERVENTION N/A 07/08/2019   Procedure: CORONARY STENT INTERVENTION;  Surgeon: Jettie Booze, MD;  Location: [place] [facility];  Service: Cardiovascular;  Laterality: N/A;  lad  . CYSTOSCOPY/URETEROSCOPY/HOLMIUM LASER/STENT PLACEMENT Right 01/02/2018   Procedure: CYSTOSCOPY/URETEROSCOPY/HOLMIUM LASER/STENT PLACEMENT;  Surgeon: [name], MD;  Location: [facility];  Service: Urology;  Laterality: Right;  . ESOPHAGOGASTRODUODENOSCOPY (EGD) WITH PROPOFOL N/A 11/19/2016   Procedure: ESOPHAGOGASTRODUODENOSCOPY (EGD) WITH PROPOFOL;  Surgeon: [name], MD;  Location: [facility];  Service: Endoscopy;  Laterality: N/A;  . ESOPHAGOGASTRODUODENOSCOPY (EGD) WITH PROPOFOL N/A 02/02/2019   Procedure: ESOPHAGOGASTRODUODENOSCOPY (EGD) WITH PROPOFOL;  Surgeon: [name], MD;  Location: [facility];  Service: Endoscopy;  Laterality: N/A;  . ICD IMPLANT N/A 02/02/2020   Procedure: ICD IMPLANT;  Surgeon: Vickie Epley, MD;  Location: [place] [facility];  Service: Cardiovascular;  Laterality: N/A;  . INTRAVASCULAR PRESSURE WIRE/FFR STUDY N/A 07/07/2019   Procedure: INTRAVASCULAR PRESSURE WIRE/FFR STUDY;  Surgeon: Nelva Bush, MD;  Location: [place] [facility];  Service: Cardiovascular;  Laterality: N/A;  . INTRAVASCULAR ULTRASOUND/IVUS  N/A 07/08/2019   Procedure: Intravascular Ultrasound/IVUS;  Surgeon: Jettie Booze, MD;  Location: [place] [facility];  Service: Cardiovascular;  Laterality: N/A;  . LEAD REVISION/REPAIR N/A 02/16/2020   Procedure: LEAD REVISION/REPAIR;  Surgeon: Constance Haw, MD;  Location: [place] [facility];  Service: Cardiovascular;  Laterality: N/A;  . LEFT HEART CATH N/A 07/08/2019   Procedure: Left Heart Cath;  Surgeon: Jettie Booze, MD;  Location: [place] [facility];  Service: Cardiovascular;  Laterality: N/A;  . LEFT HEART CATH AND CORONARY ANGIOGRAPHY N/A 07/07/2019   Procedure: LEFT HEART CATH AND CORONARY ANGIOGRAPHY;  Surgeon: Nelva Bush, MD;  Location: [place] [facility];  Service: Cardiovascular;  Laterality: N/A;  . LITHOTRIPSY  2015  . MELANOMA EXCISION  1980   malignant  . NERVE SURGERY  2015   ulna nerve  . TONSILLECTOMY  1945  . WISDOM TOOTH EXTRACTION       Current Meds  Medication Sig  . acetaminophen (TYLENOL) 500 MG tablet Take 1,000 mg by mouth every 6 (six) hours as needed (pain.).  [name] aspirin EC 81 MG tablet Take 1 tablet (81 mg total) by mouth daily. Swallow whole.  . carvedilol (COREG) 6.25 MG tablet Take 1 tablet (6.25 mg total) by mouth 2 (two) times daily.  [name]  clopidogrel (PLAVIX) 75 MG tablet TAKE 1 TABLET(75 MG TOTAL) BY MOUTH DAILY WITH BREAKFAST  . ezetimibe (ZETIA) 10 MG tablet TAKE 1 TABLET(10 MG) BY MOUTH DAILY  . fluticasone (FLONASE) 50 MCG/ACT nasal spray Place into both nostrils daily as needed for allergies or rhinitis.  [name] glipiZIDE (GLUCOTROL XL) 10 MG 24 hr tablet Take 1 tablet (10 mg total) by mouth daily with breakfast.  . hydrochlorothiazide (HYDRODIURIL) 25 MG tablet TAKE 1 TABLET(25 MG) BY MOUTH DAILY  . lisinopril (ZESTRIL) 10 MG tablet TAKE 1 TABLET BY MOUTH DAILY  . Multiple Vitamin (MULTIVITAMIN WITH MINERALS) TABS tablet Take 1 tablet by mouth daily with lunch.  . nitroGLYCERIN (NITROSTAT) 0.4 MG SL tablet Place 1 tablet (0.4  mg total) under the tongue every 5 (five) minutes x 3 doses as needed for chest pain.  . Omega-3 Fatty Acids (FISH OIL) 1000 MG CAPS Take 1,000 mg by mouth daily with lunch.  Glory Rosebush VERIO test strip 2 (two) times daily.  . pantoprazole (PROTONIX) 40 MG tablet Take 1 tablet (40 mg total) by mouth daily before breakfast.  . Polyvinyl Alcohol-Povidone (MURINE TEARS FOR DRY EYES OP) Place 1-2 drops into both eyes as needed (for dry eyes).   . Probiotic Product (PROBIOTIC PO) Take 1 capsule by mouth every evening.  . psyllium (METAMUCIL) 58.6 % powder Take 1 packet by mouth at bedtime.  . rosuvastatin (CRESTOR) 10 MG tablet Take 10 mg by mouth daily.  . Semaglutide, 1 MG/DOSE, (OZEMPIC, 1 MG/DOSE,) 4 MG/3ML SOPN ADMINISTER 1 MG UNDER THE SKIN 1 TIME A WEEK  . sodium chloride (OCEAN) 0.65 % SOLN nasal spray Place 1 spray into both nostrils as needed for congestion.  [name] spironolactone (ALDACTONE) 25 MG tablet Take 1 tablet (25 mg total) by mouth daily.  . tamsulosin (FLOMAX) 0.4 MG CAPS capsule TAKE 1 CAPSULE(0.4 MG) BY MOUTH DAILY  . traMADol (ULTRAM) 50 MG tablet TAKE 1 TABLET BY MOUTH EVERY 12 HOURS AS NEEDED     Allergies:   Metformin and related, Azithromycin, Other, Percocet [oxycodone-acetaminophen], and Jardiance [empagliflozin]   Social History   Tobacco Use  . Smoking status: Current Some Day Smoker    Types: Pipe  . Smokeless tobacco: Former Systems developer    Quit date: 10/17/2015  Vaping Use  . Vaping Use: Never used  Substance Use Topics  . Alcohol use: Not Currently    Alcohol/week: 0.0 - 1.0 standard drinks  . Drug use: No     Family Hx: The patient's family history includes Colon cancer in his father; Diabetes in his father and mother; Heart attack in his mother; Heart disease in his mother; Hyperlipidemia in his mother; Hypertension in his mother; Kidney cancer in his father; Liver cancer in his father; Lung cancer in his father. There is no history of Bladder Cancer or Prostate  cancer.  ROS:   Please see the history of present illness.    Review of Systems  Constitutional: Negative.   HENT: Negative.   Respiratory: Negative.   Cardiovascular: Negative.   Gastrointestinal: Negative.   Neurological: Negative.   Psychiatric/Behavioral: Negative.   All other systems reviewed and are negative.    Labs/Other Tests and Data Reviewed:    Recent Labs: 07/07/2019: B Natriuretic Peptide 111.1; Magnesium 1.4 12/21/2019: ALT 36 02/16/2020: BUN 17; Creatinine, Ser 0.74; Hemoglobin 14.7; Platelets 158; Potassium 3.6; Sodium 138   Recent Lipid Panel Lab Results  Component Value Date/Time   CHOL 124 12/21/2019 09:19 AM   TRIG  267 (H) 12/21/2019 09:19 AM   HDL 36 (L) 12/21/2019 09:19 AM   CHOLHDL 3.4 12/21/2019 09:19 AM   LDLCALC 35 12/21/2019 09:19 AM   LDLDIRECT 55.0 02/10/2019 09:03 AM    Wt Readings from Last 3 Encounters:  03/20/20 200 lb 6 oz (90.9 kg)  02/16/20 196 lb (88.9 kg)  02/02/20 198 lb (89.8 kg)     Exam:    BP 110/78 (BP Location: Left Arm, Patient Position: Sitting, Cuff Size: Normal)   Pulse 70   Ht 6' 1.5" (1.867 m)   Wt 200 lb 6 oz (90.9 kg)   SpO2 98%   BMI 26.08 kg/m   Constitutional:  oriented to person, place, and time. No distress.  HENT:  [name]: Grossly normal Eyes:  no discharge. No scleral icterus.  Neck: No JVD, no carotid bruits  Cardiovascular: Regular rate and rhythm, no murmurs appreciated Pulmonary/Chest: Clear to auscultation bilaterally, no wheezes or rails Abdominal: Soft.  no distension.  no tenderness.  Musculoskeletal: Normal range of motion Neurological:  normal muscle tone. Coordination normal. No atrophy Skin: Skin warm and dry Psychiatric: normal affect, pleasant   ASSESSMENT & PLAN:    Atherosclerosis of native coronary artery with stable angina pectoris, unspecified whether native or transplanted heart (HCC) Ischemic cardiomyopathy, ejection fraction 35% on MRI Echo 35 to 40% Continue carvedilol  spironolactone and lisinopril Low blood pressure limiting any advancement of his medications  Cardiomyopathy, ischemic Coreg, lisinopril, spironolactone Stable  Essential hypertension Blood pressure is well controlled on today's visit. No changes made to the medications.  Nonsustained VT, PVCs Has  ICD, followed EP Lead revision successful  Atherosclerosis of abdominal aorta (HCC)  Cholesterol is at goal on the current lipid regimen. No changes to the medications were made.  Type 2 diabetes mellitus with other circulatory complication, without long-term current use of insulin (HCC) A1c previous running high, walking program, strict diet recommended A1C 7.1  Carotid artery disease, unspecified laterality (HCC) U/s in 2017 Minor carotid atherosclerosis.  No hemodynamically significant disease   Total encounter time more than 35 minutes  Greater than 50% was spent in counseling and coordination of care with the patient   Signed, Ida Rogue, MD  03/20/2020 1:31 PM    [place] [facility] [address], [place], [place] [address]

## 2020-03-20 ENCOUNTER — Encounter: Payer: Self-pay | Admitting: Cardiovascular Disease

## 2020-03-20 ENCOUNTER — Other Ambulatory Visit: Payer: Self-pay

## 2020-03-20 ENCOUNTER — Ambulatory Visit: Payer: Medicare Other | Admitting: Cardiovascular Disease

## 2020-03-20 VITALS — BP 110/78 | HR 70 | Ht 73.5 in | Wt 200.4 lb

## 2020-03-20 DIAGNOSIS — I255 Ischemic cardiomyopathy: Secondary | ICD-10-CM | POA: Diagnosis not present

## 2020-03-20 DIAGNOSIS — I25118 Atherosclerotic heart disease of native coronary artery with other forms of angina pectoris: Secondary | ICD-10-CM | POA: Diagnosis not present

## 2020-03-20 DIAGNOSIS — I700 Atherosclerosis of aorta: Secondary | ICD-10-CM | POA: Diagnosis not present

## 2020-03-20 DIAGNOSIS — I5022 Chronic systolic (congestive) heart failure: Secondary | ICD-10-CM

## 2020-03-20 DIAGNOSIS — I472 Ventricular tachycardia, unspecified: Secondary | ICD-10-CM

## 2020-03-20 DIAGNOSIS — Z95810 Presence of automatic (implantable) cardiac defibrillator: Secondary | ICD-10-CM

## 2020-03-20 MED ORDER — ASPIRIN EC 81 MG PO TBEC: 81.0000 mg | DELAYED_RELEASE_TABLET | Freq: Every day | ORAL | 3 refills | Status: AC

## 2020-03-20 NOTE — Patient Instructions (Signed)
Medication Instructions:  Stay on asa 81 daily with plavix daily   If you need a refill on your cardiac medications before your next appointment, please call your pharmacy.    Lab work: No new labs needed   If you have labs (blood work) drawn today and your tests are completely normal, you will receive your results only by: [name] MyChart Message (if you have MyChart) OR . A paper copy in the mail If you have any lab test that is abnormal or we need to change your treatment, we will call you to review the results.   Testing/Procedures: No new testing needed   Follow-Up: At [facility], you and your health needs are our priority.  As part of our continuing mission to provide you with exceptional heart care, we have created designated Provider Care Teams.  These Care Teams include your primary Cardiologist (physician) and Advanced Practice Providers (APPs -  Physician Assistants and Nurse Practitioners) who all work together to provide you with the care you need, when you need it.  . You will need a follow up appointment in 12 months  . Providers on your designated Care Team:   . Murray Hodgkins, NP . Christell Faith, PA-C . Marrianne Mood, PA-C  Any Other Special Instructions Will Be Listed Below (If Applicable).  COVID-19 Vaccine Information can be found at: [email or web address] For questions related to vaccine distribution or appointments, please email [email or web address] or call [phone].

## 2020-03-28 ENCOUNTER — Encounter: Payer: Self-pay | Admitting: Family

## 2020-03-28 ENCOUNTER — Ambulatory Visit (INDEPENDENT_AMBULATORY_CARE_PROVIDER_SITE_OTHER): Payer: Medicare Other | Admitting: Family

## 2020-03-28 ENCOUNTER — Other Ambulatory Visit: Payer: Self-pay

## 2020-03-28 VITALS — BP 102/62 | HR 61 | Temp 97.6°F | Ht 73.5 in | Wt 204.2 lb

## 2020-03-28 DIAGNOSIS — R5383 Other fatigue: Secondary | ICD-10-CM | POA: Diagnosis not present

## 2020-03-28 DIAGNOSIS — I10 Essential (primary) hypertension: Secondary | ICD-10-CM

## 2020-03-28 DIAGNOSIS — E782 Mixed hyperlipidemia: Secondary | ICD-10-CM | POA: Diagnosis not present

## 2020-03-28 DIAGNOSIS — E118 Type 2 diabetes mellitus with unspecified complications: Secondary | ICD-10-CM

## 2020-03-28 LAB — CBC WITH DIFFERENTIAL/PLATELET
Basophils Absolute: 0.1 10*3/uL (ref 0.0–0.1)
Basophils Relative: 0.7 % (ref 0.0–3.0)
Eosinophils Absolute: 0.2 10*3/uL (ref 0.0–0.7)
Eosinophils Relative: 2.6 % (ref 0.0–5.0)
HCT: 40.9 % (ref 39.0–52.0)
Hemoglobin: 14.4 g/dL (ref 13.0–17.0)
Lymphocytes Relative: 32.6 % (ref 12.0–46.0)
Lymphs Abs: 2.5 10*3/uL (ref 0.7–4.0)
MCHC: 35.1 g/dL (ref 30.0–36.0)
MCV: 87.9 fl (ref 78.0–100.0)
Monocytes Absolute: 0.6 10*3/uL (ref 0.1–1.0)
Monocytes Relative: 7.2 % (ref 3.0–12.0)
Neutro Abs: 4.4 10*3/uL (ref 1.4–7.7)
Neutrophils Relative %: 56.9 % (ref 43.0–77.0)
Platelets: 148 10*3/uL — ABNORMAL LOW (ref 150.0–400.0)
RBC: 4.65 Mil/uL (ref 4.22–5.81)
RDW: 13.9 % (ref 11.5–15.5)
WBC: 7.8 10*3/uL (ref 4.0–10.5)

## 2020-03-28 LAB — COMPREHENSIVE METABOLIC PANEL
ALT: 38 U/L (ref 0–53)
AST: 33 U/L (ref 0–37)
Albumin: 4 g/dL (ref 3.5–5.2)
Alkaline Phosphatase: 52 U/L (ref 39–117)
BUN: 18 mg/dL (ref 6–23)
CO2: 26 mEq/L (ref 19–32)
Calcium: 9.1 mg/dL (ref 8.4–10.5)
Chloride: 101 mEq/L (ref 96–112)
Creatinine, Ser: 0.83 mg/dL (ref 0.40–1.50)
GFR: 82.85 mL/min (ref >60.00)
Glucose, Bld: 140 mg/dL — ABNORMAL HIGH (ref 70–99)
Potassium: 4 mEq/L (ref 3.5–5.1)
Sodium: 137 mEq/L (ref 135–145)
Total Bilirubin: 1.2 mg/dL (ref 0.2–1.2)
Total Protein: 6.6 g/dL (ref 6.0–8.3)

## 2020-03-28 LAB — HEMOGLOBIN A1C: Hgb A1c MFr Bld: 7.3 % — ABNORMAL HIGH (ref 4.6–6.5)

## 2020-03-28 LAB — TSH: TSH: 1.36 u[IU]/mL (ref 0.35–4.50)

## 2020-03-28 LAB — B12 AND FOLATE PANEL
Folate: >23.6 ng/mL (ref >5.9)
Vitamin B-12: 641 pg/mL (ref 211–911)

## 2020-03-28 NOTE — Progress Notes (Signed)
Subjective:    Patient ID: [name] [name], male    DOB: 1940-10-05, 80 y.o.   MRN: [id number]  CC: [name] [name] is a 80 y.o. male who presents today for follow up.   HPI: Eating well and trying to eat healthier. On his scales he is 197 lb at home. Gets 6 hrs a night of sleep. Feels rested when gets up. No snoring. He reports sleepiness around lunch time daily. Sleep interrupted by dog at night. Walking daily and energy improves.   DM- FBG are less than 150. Post prandial 140. No hypoglycemic episodes.  compliant with Ozempic 1 mg weekly, glipizide 10 mg QAM  HLD -compliant with crestor 10mg .   HTN- compliant with carvedilol 6.25 mg BID, spironolactone 25 mg QAM, lisinopril 10 mg QAM, HCTZ 25 mg daily. No dizziness.  03/20/20-  Seen by [name] for CAD, cardiomyopathy. Compliant with clopidogrel 75 mg qd Following with Dr Quentin Ore, EP ; has ICD  Taking b12 ( cannot recall dose) QOD.     HISTORY:  Past Medical History:  Diagnosis Date  . Aortic insufficiency    a. noted on TTE 2015; b. 06/2019 Echo: AI not visualized.  . Arthritis   . CAD (coronary artery disease)    a. remote PCI in 1991 and 2005; b. MV 3/15: old inferior MI, no ischemia, LVEF 50%, slight inferior wall hypokniesis; c. 06/2019 NSTEMI/PCI: LM nl, LAD 80p/m (Atherectomy & 4.5x18 Resolute Onyx DES), 62m/d, D1 75 (PTCA), RI patent stent, LCX nl, RCA 100p, RPAV fills via L->R collats from LCX.  [name] Chicken pox   . Colon polyps    4 pre-cancerous   . Diverticulitis   . DM type 2 (diabetes mellitus, type 2) ([place])   . Family history of adverse reaction to anesthesia   . GERD (gastroesophageal reflux disease)   . Heart murmur   . History of kidney stones   . HOH (hard of hearing)   . Hyperlipidemia   . Hypertension   . Ischemic cardiomyopathy    a. TTE 2015: EF  50-55%, mild global HK; b. 06/2019 Echo: EF 45-50%, Gr1 DD, basal inf AK. Triv MR.  . Kidney stones   . Melanoma ([place]) 1980   Resected from his back  .  Mitral regurgitation    a. noted on TTE 2015  . Myocardial infarction [facility])    Past Surgical History:  Procedure Laterality Date  . CHOLECYSTECTOMY  2012  . COLONOSCOPY     in 2003 with polyp removed and leak anastomosis had to have open abdominal surgery   . CORONARY ANGIOPLASTY WITH STENT PLACEMENT  1991 & 2005  . CORONARY ATHERECTOMY N/A 07/08/2019   Procedure: CORONARY ATHERECTOMY;  Surgeon: Jettie Booze, MD;  Location: [place] [facility];  Service: Cardiovascular;  Laterality: N/A;  . CORONARY BALLOON ANGIOPLASTY N/A 07/08/2019   Procedure: CORONARY BALLOON ANGIOPLASTY;  Surgeon: Jettie Booze, MD;  Location: [place] [facility];  Service: Cardiovascular;  Laterality: N/A;  diagonal   . CORONARY STENT INTERVENTION N/A 07/08/2019   Procedure: CORONARY STENT INTERVENTION;  Surgeon: Jettie Booze, MD;  Location: [place] [facility];  Service: Cardiovascular;  Laterality: N/A;  lad  . CYSTOSCOPY/URETEROSCOPY/HOLMIUM LASER/STENT PLACEMENT Right 01/02/2018   Procedure: CYSTOSCOPY/URETEROSCOPY/HOLMIUM LASER/STENT PLACEMENT;  Surgeon: [name], MD;  Location: [facility];  Service: Urology;  Laterality: Right;  . ESOPHAGOGASTRODUODENOSCOPY (EGD) WITH PROPOFOL N/A 11/19/2016   Procedure: ESOPHAGOGASTRODUODENOSCOPY (EGD) WITH PROPOFOL;  Surgeon: [name], MD;  Location: [facility]  ENDOSCOPY;  Service: Endoscopy;  Laterality: N/A;  . ESOPHAGOGASTRODUODENOSCOPY (EGD) WITH PROPOFOL N/A 02/02/2019   Procedure: ESOPHAGOGASTRODUODENOSCOPY (EGD) WITH PROPOFOL;  Surgeon: [name], MD;  Location: [facility] ENDOSCOPY;  Service: Endoscopy;  Laterality: N/A;  . ICD IMPLANT N/A 02/02/2020   Procedure: ICD IMPLANT;  Surgeon: Vickie Epley, MD;  Location: [place] [facility];  Service: Cardiovascular;  Laterality: N/A;  . INTRAVASCULAR PRESSURE WIRE/FFR STUDY N/A 07/07/2019   Procedure: INTRAVASCULAR PRESSURE WIRE/FFR STUDY;  Surgeon: Nelva Bush, MD;  Location: [place] [facility];   Service: Cardiovascular;  Laterality: N/A;  . INTRAVASCULAR ULTRASOUND/IVUS N/A 07/08/2019   Procedure: Intravascular Ultrasound/IVUS;  Surgeon: Jettie Booze, MD;  Location: [employer] [facility];  Service: Cardiovascular;  Laterality: N/A;  . LEAD REVISION/REPAIR N/A 02/16/2020   Procedure: LEAD REVISION/REPAIR;  Surgeon: Constance Haw, MD;  Location: [place] [facility];  Service: Cardiovascular;  Laterality: N/A;  . LEFT HEART CATH N/A 07/08/2019   Procedure: Left Heart Cath;  Surgeon: Jettie Booze, MD;  Location: [place] [facility];  Service: Cardiovascular;  Laterality: N/A;  . LEFT HEART CATH AND CORONARY ANGIOGRAPHY N/A 07/07/2019   Procedure: LEFT HEART CATH AND CORONARY ANGIOGRAPHY;  Surgeon: Nelva Bush, MD;  Location: [place] [facility];  Service: Cardiovascular;  Laterality: N/A;  . LITHOTRIPSY  2015  . MELANOMA EXCISION  1980   malignant  . NERVE SURGERY  2015   ulna nerve  . TONSILLECTOMY  1945  . WISDOM TOOTH EXTRACTION     Family History  Problem Relation Age of Onset  . Hyperlipidemia Mother   . Hypertension Mother   . Heart disease Mother   . Diabetes Mother   . Heart attack Mother   . Colon cancer Father   . Lung cancer Father   . Kidney cancer Father        malignant capsulated kidney tumor  . Diabetes Father   . Liver cancer Father   . Bladder Cancer Neg Hx   . Prostate cancer Neg Hx     Allergies: Metformin and related, Azithromycin, Other, Percocet [oxycodone-acetaminophen], and Jardiance [empagliflozin] Current Outpatient Medications on File Prior to Visit  Medication Sig Dispense Refill  . acetaminophen (TYLENOL) 500 MG tablet Take 1,000 mg by mouth every 6 (six) hours as needed (pain.).    [name] aspirin EC 81 MG tablet Take 1 tablet (81 mg total) by mouth daily. Swallow whole. 90 tablet 3  . carvedilol (COREG) 6.25 MG tablet Take 1 tablet (6.25 mg total) by mouth 2 (two) times daily. 180 tablet 3  . clopidogrel (PLAVIX) 75 MG tablet  TAKE 1 TABLET(75 MG TOTAL) BY MOUTH DAILY WITH BREAKFAST 60 tablet 1  . ezetimibe (ZETIA) 10 MG tablet TAKE 1 TABLET(10 MG) BY MOUTH DAILY 90 tablet 0  . fluticasone (FLONASE) 50 MCG/ACT nasal spray Place into both nostrils daily as needed for allergies or rhinitis.    . hydrochlorothiazide (HYDRODIURIL) 25 MG tablet TAKE 1 TABLET(25 MG) BY MOUTH DAILY 90 tablet 0  . lisinopril (ZESTRIL) 10 MG tablet TAKE 1 TABLET BY MOUTH DAILY 90 tablet 1  . Multiple Vitamin (MULTIVITAMIN WITH MINERALS) TABS tablet Take 1 tablet by mouth daily with lunch.    . nitroGLYCERIN (NITROSTAT) 0.4 MG SL tablet Place 1 tablet (0.4 mg total) under the tongue every 5 (five) minutes x 3 doses as needed for chest pain. 25 tablet 0  . Omega-3 Fatty Acids (FISH OIL) 1000 MG CAPS Take 1,000 mg by mouth daily  with lunch.    Glory Rosebush VERIO test strip 2 (two) times daily.    . pantoprazole (PROTONIX) 40 MG tablet Take 1 tablet (40 mg total) by mouth daily before breakfast. 30 tablet 6  . Polyvinyl Alcohol-Povidone (MURINE TEARS FOR DRY EYES OP) Place 1-2 drops into both eyes as needed (for dry eyes).     . Probiotic Product (PROBIOTIC PO) Take 1 capsule by mouth every evening.    . psyllium (METAMUCIL) 58.6 % powder Take 1 packet by mouth at bedtime.    . rosuvastatin (CRESTOR) 10 MG tablet Take 10 mg by mouth daily.    . Semaglutide, 1 MG/DOSE, (OZEMPIC, 1 MG/DOSE,) 4 MG/3ML SOPN ADMINISTER 1 MG UNDER THE SKIN 1 TIME A WEEK 3 mL 1  . sodium chloride (OCEAN) 0.65 % SOLN nasal spray Place 1 spray into both nostrils as needed for congestion.    [name] spironolactone (ALDACTONE) 25 MG tablet Take 1 tablet (25 mg total) by mouth daily. 90 tablet 3  . tamsulosin (FLOMAX) 0.4 MG CAPS capsule TAKE 1 CAPSULE(0.4 MG) BY MOUTH DAILY 90 capsule 3  . traMADol (ULTRAM) 50 MG tablet TAKE 1 TABLET BY MOUTH EVERY 12 HOURS AS NEEDED 60 tablet 1   No current facility-administered medications on file prior to visit.    Social History   Tobacco  Use  . Smoking status: Current Some Day Smoker    Types: Pipe  . Smokeless tobacco: Former Systems developer    Quit date: 10/17/2015  Vaping Use  . Vaping Use: Never used  Substance Use Topics  . Alcohol use: Not Currently    Alcohol/week: 0.0 - 1.0 standard drinks  . Drug use: No    Review of Systems  Constitutional: Positive for fatigue. Negative for chills and fever.  Respiratory: Negative for cough and shortness of breath.   Cardiovascular: Negative for chest pain, palpitations and leg swelling.  Gastrointestinal: Negative for nausea and vomiting.      Objective:    BP 102/62   Pulse 61   Temp 97.6 F (36.4 C)   Ht 6' 1.5" (1.867 m)   Wt 204 lb 3.2 oz (92.6 kg)   SpO2 97%   BMI 26.58 kg/m  BP Readings from Last 3 Encounters:  03/28/20 102/62  03/20/20 110/78  02/16/20 134/88   Wt Readings from Last 3 Encounters:  03/28/20 204 lb 3.2 oz (92.6 kg)  03/20/20 200 lb 6 oz (90.9 kg)  02/16/20 196 lb (88.9 kg)    Physical Exam Vitals reviewed.  Constitutional:      Appearance: He is well-developed.  Cardiovascular:     Rate and Rhythm: Regular rhythm.     Heart sounds: Normal heart sounds.  Pulmonary:     Effort: Pulmonary effort is normal. No respiratory distress.     Breath sounds: Normal breath sounds. No wheezing, rhonchi or rales.  Musculoskeletal:     Right lower leg: No edema.     Left lower leg: No edema.  Skin:    General: Skin is warm and dry.  Neurological:     Mental Status: He is alert.  Psychiatric:        Speech: Speech normal.        Behavior: Behavior normal.        Assessment & Plan:   Problem List Items Addressed This Visit      Cardiovascular and Mediastinum   Essential hypertension - Primary    Excellent control. Continue carvedilol 6.25 mg BID, spironolactone 25 mg  QAM, lisinopril 10 mg QAM, HCTZ 25 mg daily.      Relevant Orders   TSH (Completed)   CBC with Differential/Platelet (Completed)   Comprehensive metabolic panel  (Completed)   Hemoglobin A1c (Completed)   B12 and Folate Panel (Completed)     Endocrine   Controlled type 2 diabetes mellitus with complication, without long-term current use of insulin (HCC)    Lab Results  Component Value Date   HGBA1C 7.3 (H) 03/28/2020  uncontrolled. Advised to increase glipizide to 20 mg QD. continue Ozempic 1 mg weekly        Other   Fatigue    Improves with exercise. Sleep is restorative.  Discussed  Likely multifactorial including cardiomyopathy. Advised to carve more time in the day for small frequent walks. If symptom persists, worsens, he will let me know so we can further evaluate.       Mixed hyperlipidemia    Well controlled. Continue crestor 10mg           I am having [name] "Ben" maintain his psyllium, Polyvinyl Alcohol-Povidone (MURINE TEARS FOR DRY EYES OP), sodium chloride, nitroGLYCERIN, OneTouch Verio, tamsulosin, lisinopril, carvedilol, spironolactone, pantoprazole, Ozempic (1 MG/DOSE), clopidogrel, hydrochlorothiazide, acetaminophen, Fish Oil, multivitamin with minerals, Probiotic Product (PROBIOTIC PO), ezetimibe, traMADol, fluticasone, rosuvastatin, and aspirin EC.   No orders of the defined types were placed in this encounter.   Return precautions given.   Risks, benefits, and alternatives of the medications and treatment plan prescribed today were discussed, and patient expressed understanding.   Education regarding symptom management and diagnosis given to patient on AVS.  Continue to follow with [name], [name] for routine health maintenance.   [name] and I agreed with plan.   [name], [name]

## 2020-03-28 NOTE — Patient Instructions (Signed)
Nice to see you!   

## 2020-03-29 ENCOUNTER — Encounter: Payer: Self-pay | Admitting: Family

## 2020-03-29 ENCOUNTER — Other Ambulatory Visit: Payer: Self-pay | Admitting: Family

## 2020-03-29 DIAGNOSIS — E118 Type 2 diabetes mellitus with unspecified complications: Secondary | ICD-10-CM

## 2020-03-29 MED ORDER — GLIPIZIDE ER 10 MG PO TB24: 20.0000 mg | ORAL_TABLET | Freq: Every day | ORAL | 1 refills | Status: DC

## 2020-03-30 NOTE — Telephone Encounter (Signed)
Patient calling in asking to speak with Triage RN after speaking with [facility] office regarding this message. Pt made aware this has been sent to Dr. Rockey Situ and a nurse will be in touch   Please advise

## 2020-03-30 NOTE — Telephone Encounter (Signed)
Spoke with patient and he is somewhat apprehensive about his lower blood pressure readings. He wanted to know if Dr. Rockey Situ had reviewed these with any recommendations and advised he it is still pending his review. Reviewed that I would also send over to Dr. Mardene Speak nurse as well. Instructed him to continue monitoring blood pressures and keep a log of them. Advised to use caution with changes in position and that once we hear back from provider we will be in touch with any further recommendations. He verbalized understanding with no further questions at this time.

## 2020-03-31 ENCOUNTER — Other Ambulatory Visit: Payer: Self-pay | Admitting: Family

## 2020-03-31 ENCOUNTER — Ambulatory Visit: Payer: Medicare Other | Admitting: Pharmacist

## 2020-03-31 DIAGNOSIS — I25118 Atherosclerotic heart disease of native coronary artery with other forms of angina pectoris: Secondary | ICD-10-CM

## 2020-03-31 DIAGNOSIS — E1165 Type 2 diabetes mellitus with hyperglycemia: Secondary | ICD-10-CM

## 2020-03-31 DIAGNOSIS — R5383 Other fatigue: Secondary | ICD-10-CM | POA: Insufficient documentation

## 2020-03-31 DIAGNOSIS — I255 Ischemic cardiomyopathy: Secondary | ICD-10-CM

## 2020-03-31 DIAGNOSIS — E782 Mixed hyperlipidemia: Secondary | ICD-10-CM

## 2020-03-31 DIAGNOSIS — I10 Essential (primary) hypertension: Secondary | ICD-10-CM

## 2020-03-31 MED ORDER — HYDROCHLOROTHIAZIDE 12.5 MG PO TABS: 12.5000 mg | ORAL_TABLET | Freq: Every day | ORAL | Status: DC

## 2020-03-31 NOTE — Assessment & Plan Note (Signed)
Well controlled. Continue crestor 10mg 

## 2020-03-31 NOTE — Chronic Care Management (AMB) (Addendum)
Chronic Care Management Pharmacy Note  03/31/2020 Name:  Christpher Stogsdill MRN:  [id number] DOB:  22-Mar-1940  Subjective: [name] [name] is an 80 y.o. year old male who is a primary patient of Vidal Schwalbe, [name], [name].  The CCM team was consulted for assistance with disease management and care coordination needs.    Engaged with patient by telephone for reponse to voicemail left earlier this morning regarding medications in response to provider referral for pharmacy case management and/or care coordination services. Patient engaged w/ PCP via [place] before I had a chance to call him back  Consent to Services:  The patient was given information about Chronic Care Management services, agreed to services, and gave verbal consent prior to initiation of services.  Please see initial visit note for detailed documentation.   Patient Care Team: [name], [name] as PCP - General (Family Medicine) [name], MD as PCP - Cardiology (Cardiology) Vickie Epley, MD as PCP - Electrophysiology (Cardiology) De Hollingshead, RPH-CPP as Pharmacist (Pharmacist)  Recent office visits:  3/15 - PCP visit, increased glipizide to 20 mg QAM   Recent consult visits:  3/7- cardiology Dr. Rockey Situ; continue current medications  Hospital visits: See previous CCM ntoe  Objective:  Lab Results  Component Value Date   CREATININE 0.83 03/28/2020   CREATININE 0.74 02/16/2020   CREATININE 0.80 01/31/2020    Lab Results  Component Value Date   HGBA1C 7.3 (H) 03/28/2020   Last diabetic Eye exam:  Lab Results  Component Value Date/Time   HMDIABEYEEXA No Retinopathy 09/10/2019 12:00 AM    Last diabetic Foot exam: No results found for: HMDIABFOOTEX      Component Value Date/Time   CHOL 124 12/21/2019 0919   TRIG 267 (H) 12/21/2019 0919   HDL 36 (L) 12/21/2019 0919   CHOLHDL 3.4 12/21/2019 0919   VLDL 53 (H) 12/21/2019 0919   LDLCALC 35 12/21/2019 0919   LDLDIRECT 55.0  02/10/2019 0903    Hepatic Function Latest Ref Rng & Units 03/28/2020 12/21/2019 07/07/2019  Total Protein 6.0 - 8.3 g/dL 6.6 7.4 7.4  Albumin 3.5 - 5.2 g/dL 4.0 4.1 4.0  AST 0 - 37 U/L 33 33 47(H)  ALT 0 - 53 U/L 38 36 41  Alk Phosphatase 39 - 117 U/L 52 53 55  Total Bilirubin 0.2 - 1.2 mg/dL 1.2 1.7(H) 2.0(H)  Bilirubin, Direct 0.0 - 0.2 mg/dL - 0.2 -    Lab Results  Component Value Date/Time   TSH 1.36 03/28/2020 11:36 AM   TSH 1.006 01/08/2017 11:15 AM   TSH 0.76 12/19/2014 04:39 PM   FREET4 1.05 01/08/2017 11:15 AM    CBC Latest Ref Rng & Units 03/28/2020 02/16/2020 01/31/2020  WBC 4.0 - 10.5 K/uL 7.8 7.4 7.1  Hemoglobin 13.0 - 17.0 g/dL 14.4 14.7 14.3  Hematocrit 39.0 - 52.0 % 40.9 42.1 40.8  Platelets 150.0 - 400.0 K/uL 148.0(L) 158 162    Lab Results  Component Value Date/Time   VD25OH 33.0 01/08/2017 11:15 AM    Clinical ASCVD: Yes     Social History   Tobacco Use  Smoking Status Current Some Day Smoker  . Types: Pipe  Smokeless Tobacco Former Systems developer  . Quit date: 10/17/2015   BP Readings from Last 3 Encounters:  03/28/20 102/62  03/20/20 110/78  02/16/20 134/88   Pulse Readings from Last 3 Encounters:  03/28/20 61  03/20/20 70  02/16/20 68   Wt Readings from Last 3 Encounters:  03/28/20  204 lb 3.2 oz (92.6 kg)  03/20/20 200 lb 6 oz (90.9 kg)  02/16/20 196 lb (88.9 kg)    Assessment: Review of patient past medical history, allergies, medications, health status, including review of consultants reports, laboratory and other test data, was performed as part of comprehensive evaluation and provision of chronic care management services.   SDOH:  (Social Determinants of Health) assessments and interventions performed:    CCM Care Plan  Allergies  Allergen Reactions  . Metformin And Related Diarrhea and Other (See Comments)    Leg cramps, also  . Azithromycin Other (See Comments)    Not recommended - no reaction  . Other Other (See Comments)    If  taking antibiotics for awhile, thrush results  . Percocet [Oxycodone-Acetaminophen] Other (See Comments)    Hallucinations  . Jardiance [Empagliflozin] Other (See Comments)    Tongue itching/reaction    Medications Reviewed Today    Reviewed by De Hollingshead, RPH-CPP (Pharmacist) on 03/31/20 at [place] [facility] Status: <None>  Medication Order Taking? Sig Documenting Provider Last Dose Status Informant  acetaminophen (TYLENOL) 500 MG tablet [id number]  Take 1,000 mg by mouth every 6 (six) hours as needed (pain.). [provider]  Active Self  aspirin EC 81 MG tablet [id number]  Take 1 tablet (81 mg total) by mouth daily. Swallow whole. [name], MD  Active   carvedilol (COREG) 6.25 MG tablet [id number] Yes Take 1 tablet (6.25 mg total) by mouth 2 (two) times daily. Vickie Epley, MD Taking Active Self  clopidogrel (PLAVIX) 75 MG tablet [id number]  TAKE 1 TABLET(75 MG TOTAL) BY MOUTH DAILY WITH BREAKFAST [name], [name], MD  Active   ezetimibe (ZETIA) 10 MG tablet [id number]  TAKE 1 TABLET(10 MG) BY MOUTH DAILY [name], [name], MD  Active   fluticasone (FLONASE) 50 MCG/ACT nasal spray [id number]  Place into both nostrils daily as needed for allergies or rhinitis. [provider]  Active   glipiZIDE (GLUCOTROL XL) 10 MG 24 hr tablet [id number] Yes Take 2 tablets (20 mg total) by mouth daily with breakfast. [name], [name] Taking Active   hydrochlorothiazide (HYDRODIURIL) 12.5 MG tablet [id number] Yes Take 1 tablet (12.5 mg total) by mouth daily. [name], [name] Taking Active   lisinopril (ZESTRIL) 10 MG tablet [id number]  TAKE 1 TABLET BY MOUTH DAILY [name], [name]  Active   Multiple Vitamin (MULTIVITAMIN WITH MINERALS) TABS tablet [id number]  Take 1 tablet by mouth daily with lunch. [provider]  Active Self  nitroGLYCERIN (NITROSTAT) 0.4 MG SL tablet [id number]  Place 1 tablet (0.4 mg total) under the tongue every 5 (five) minutes  x 3 doses as needed for chest pain. [name], NP  Active Self  Omega-3 Fatty Acids (FISH OIL) 1000 MG CAPS [id number]  Take 1,000 mg by mouth daily with lunch. [provider]  Active Self  Roma Schanz test strip [id number]  2 (two) times daily. [provider]  Active Self  pantoprazole (PROTONIX) 40 MG tablet [id number]  Take 1 tablet (40 mg total) by mouth daily before breakfast. [name], MD  Active Self  Polyvinyl Alcohol-Povidone (MURINE TEARS FOR DRY EYES OP) [id number]  Place 1-2 drops into both eyes as needed (for dry eyes).  [provider]  Active Self  Probiotic Product (PROBIOTIC PO) [id number]  Take 1 capsule by mouth every evening. [provider]  Active Self  psyllium (METAMUCIL) 58.6 % powder [id number]  Take  1 packet by mouth at bedtime. [provider]  Active Self           Med Note Duffy [name], [name]   Wed Jul 07, 2019  6:13 PM)    rosuvastatin (CRESTOR) 10 MG tablet [id number] Yes Take 10 mg by mouth daily. [provider] Taking Active   Semaglutide, 1 MG/DOSE, (OZEMPIC, 1 MG/DOSE,) 4 MG/3ML SOPN [id number] Yes ADMINISTER 1 MG UNDER THE SKIN 1 TIME A WEEK [name], [name], [name] Taking Active   sodium chloride (OCEAN) 0.65 % SOLN nasal spray [id number]  Place 1 spray into both nostrils as needed for congestion. [provider]  Active Self  spironolactone (ALDACTONE) 25 MG tablet [id number] Yes Take 1 tablet (25 mg total) by mouth daily. [name], MD Taking Active   tamsulosin (FLOMAX) 0.4 MG CAPS capsule [id number] Yes TAKE 1 CAPSULE(0.4 MG) BY MOUTH DAILY [name], MD Taking Active   traMADol (ULTRAM) 50 MG tablet [id number]  TAKE 1 TABLET BY MOUTH EVERY 12 HOURS AS NEEDED [name], [name]  Active           Patient Active Problem List   Diagnosis Date Noted  . Fatigue 03/31/2020  . Malfunction of implantable defibrillator ventricular (ICD) lead 02/15/2020  . Cardiomyopathy ([place])    . Chronic systolic heart failure ([place]) 02/02/2020  . Mural thrombus of left ventricle 02/02/2020  . ICD (implantable cardioverter-defibrillator) in place 02/02/2020  . Ischemic cardiomyopathy   . VT (ventricular tachycardia) ([place]) 07/09/2019  . NSTEMI (non-ST elevated myocardial infarction) ([place]) 07/07/2019  . De Quervain's tenosynovitis, left 05/17/2019  . Esophagitis, unspecified without bleeding   . Esophageal ulcer 11/20/2018  . Acute left flank pain 10/23/2018  . Post-nasal drip 10/23/2018  . Allergic rhinitis 10/23/2018  . Right shoulder pain 07/03/2018  . Skin lesion 06/12/2018  . Pruritus 06/12/2018  . Kidney stones, calcium oxalate 05/28/2018  . Atherosclerosis of native coronary artery with stable angina pectoris ([place]) 05/17/2018  . GERD (gastroesophageal reflux disease) 02/23/2018  . SCC (squamous cell carcinoma) 05/15/2017  . Cervical stenosis of spine 05/15/2017  . Tobacco abuse 01/26/2017  . Cardiac murmur 01/08/2017  . Right renal mass 10/31/2016  . Abdominal pain 10/31/2016  . Fatty liver 10/31/2016  . Hematuria 10/31/2016  . Acute non-recurrent maxillary sinusitis 10/17/2015  . Carotid artery disease ([place]) 10/03/2015  . Atherosclerosis of abdominal aorta ([place]) 07/28/2015  . CAD (coronary artery disease) 07/10/2015  . Essential hypertension 07/10/2015  . Mixed hyperlipidemia 07/10/2015  . Controlled type 2 diabetes mellitus with complication, without long-term current use of insulin ([place]) 07/10/2015  . History of melanoma 07/10/2015  . Chronic low back pain 07/10/2015  . BPH (benign prostatic hyperplasia) 07/10/2015  . Bilateral hand numbness 07/10/2015  . Insomnia 07/10/2015    Immunization History  Administered Date(s) Administered  . Influenza, High Dose Seasonal PF 11/01/2015, 10/20/2017, 09/28/2019  . Influenza,inj,Quad PF,6+ Mos 10/08/2016  . Influenza-Unspecified 10/01/2018  . PFIZER(Purple Top)SARS-COV-2 Vaccination 02/04/2019, 02/25/2019,  10/12/2019    Conditions to be addressed/monitored: CAD, HTN, HLD and DMII  Care Plan : Medication Management  Updates made by De Hollingshead, RPH-CPP since 03/31/2020 12:00 AM    Problem: Diabetes, CAD, HTN, HLD     Long-Range Goal: Disease Progression Prevention   Recent Progress: On track  Priority: High  Note:   Current Barriers:  . Unable to independently afford treatment regimen . Unable to achieve control of diabetes   Pharmacist Clinical Goal(s):  [name] Over  the next 90 days, patient will verbalize ability to afford treatment regimen . Over the next 90 days, patient will maintain control of diabetes as evidenced by A1c  through collaboration with PharmD and provider.   Interventions: . 1:1 collaboration with [name], [name] regarding development and update of comprehensive plan of care as evidenced by provider attestation and co-signature . Inter-disciplinary care team collaboration (see longitudinal plan of care) . Comprehensive medication review performed; medication list updated in electronic medical record  Diabetes: . Uncontrolled; current treatment: Ozempic 1 mg weekly, glipizide 20 mg QAM  o Has been unable to tolerate metformin, IR and ER, d/t diarrhea.  o Did not tolerate Jardiance d/t tongue itching/sores, avoid other SGLT2 for risk of allergic reaction . Given half life of glipizide, would prefer not to given 20 mg dose once daily for risk of mid-morning hypoglycemia. Consider breaking up dose to take BID with larger meals. Consider use of CGM for improved glycemic monitoring. Will discuss with patient moving forward.   Hypertension, Ventricular Ectopy, s/p . Controlled; current treatment: carvedilol 6.25 mg BID, spironolactone 25 mg QAM, lisinopril 10 mg QAM, HCTZ 25 mg daily - patient self decreased to 12.5 mg daily due to reports of lower BP.  . S/p ICD placement: Primary cardiologist Dr. Rockey Situ; EP Dr. Quentin Ore.  . Home BP reading: 90s/50s prior to  dose reduction in HCTZ., though denies any symptoms, including lightheadedness, dizziness. Reports BP was in ~120s/60s initially, but had a later reading w/ SBP <100 later this afternoon that concerned him.  . Provided reassurance. Continue to follow for cardiology recommendations. Patient asked if other medications should be reduced, as he tried taking half of a carvedilol at some point to see if this resulted in less low blood pressures. Advised to avoid self-adjusting medication without provider recommendations to avoid impacting arrhythmias.   Hyperlipidemia and ASCVD risk reduction: [name] Very well controlled on last lab work, but not checked on current regimen; current treatment: rosuvastatin 10 mg daily, ezetimibe 10 mg daily   . Antiplatelet regimen: clopidogrel 75 mg daily  . Previously recommended to continue current regimen at this time   BPH: . Controlled per patient report; current regimen: tamsulosin 0.4 mg daily . Previously recommended to continue current regimen at this time  GERD: . Controlled per patient report; current regimen: pantoprazole 40 mg daily . Previously recommended to continue current regimen at this time  Patient Goals/Self-Care Activities . Over the next 90 days, patient will:  - take medications as prescribed check glucose daily, document, and provide at future appointments check blood pressure daily, document, and provide at future appointments collaborate with provider on medication access solutions  Follow Up Plan: Telephone follow up appointment with care management team member scheduled for: ~ 4 weeks      Medication Assistance: Ozempic obtained through [employer] medication assistance program.  Enrollment ends 01/13/21  Patient's preferred pharmacy is:  [id number] [address], [place] [address] Phone: [phone] Fax: [phone]  Follow  Up:  Patient agrees to Care Plan and Follow-up.  Plan: Telephone follow up appointment with care management team member scheduled for:  ~ 4 weeks as previously scheduled  [name], PharmD, [place], CPP Clinical Pharmacist [employer] at [place]  Encounter details: CCM Time Spent      Value Time User   Time spent with patient (minutes)  12 03/31/2020  4:40 PM De Hollingshead, RPH-CPP   Time spent performing Chart review  0 03/31/2020  4:40 PM De Hollingshead, RPH-CPP   Total time (minutes)  12 03/31/2020  4:40 PM De Hollingshead, RPH-CPP     Moderate to High Complex Decision Making      Value Time User   Moderate to High complex decision making  No 03/31/2020  4:40 PM De Hollingshead, RPH-CPP     CCM Services: This encounter meets routine CCM services.  Prior to outreach and patient consent for Chronic Care Management, I referred this patient for services after reviewing the nominated patient list or from a personal encounter with the patient.  I have personally reviewed this encounter including the documentation in this note and have collaborated with the care management provider regarding care management and care coordination activities to include development and update of the comprehensive care plan. I am certifying that I agree with the content of this note and encounter as supervising physician.

## 2020-03-31 NOTE — Assessment & Plan Note (Signed)
Excellent control. Continue carvedilol 6.25 mg BID, spironolactone 25 mg QAM, lisinopril 10 mg QAM, HCTZ 25 mg daily.

## 2020-03-31 NOTE — Patient Instructions (Signed)
Visit Information  PATIENT GOALS:  Goals Addressed               This Visit's Progress     Patient Stated     Medication Monitoring (pt-stated)        Patient Goals/Self-Care Activities Over the next 90 days, patient will:  - take medications as prescribed check glucose daily, document, and provide at future appointments check blood pressure daily, document, and provide at future appointments collaborate with provider on medication access solutions        Patient verbalizes understanding of instructions provided today and agrees to view in MyChart.    Plan: Telephone follow up appointment with care management team member scheduled for:  4 weeks as previously scheduled  Catie Joselyne Spake, PharmD, BCACP, CPP Clinical Pharmacist [facility] at [place] [phone] 

## 2020-03-31 NOTE — Assessment & Plan Note (Signed)
Lab Results  Component Value Date   HGBA1C 7.3 (H) 03/28/2020  uncontrolled. Advised to increase glipizide to 20 mg QD. continue Ozempic 1 mg weekly

## 2020-03-31 NOTE — Assessment & Plan Note (Signed)
Improves with exercise. Sleep is restorative.  Discussed  Likely multifactorial including cardiomyopathy. Advised to carve more time in the day for small frequent walks. If symptom persists, worsens, he will let me know so we can further evaluate.

## 2020-04-03 ENCOUNTER — Telehealth: Payer: Self-pay | Admitting: Family

## 2020-04-03 DIAGNOSIS — E118 Type 2 diabetes mellitus with unspecified complications: Secondary | ICD-10-CM

## 2020-04-03 MED ORDER — GLIPIZIDE 10 MG PO TABS: 10.0000 mg | ORAL_TABLET | Freq: Two times a day (BID) | ORAL | 3 refills | Status: DC

## 2020-04-03 NOTE — Telephone Encounter (Signed)
Call pt Reviewed [name] [name] note and agree with her recommendation    Given half life of glipizide, would prefer not to given 20 mg dose once daily for risk of mid-morning hypoglycemia. Consider breaking up dose to take BID with larger meals.    I have sent in updated prescription which reflects this with glipizide 10mg  bid ; I discontinued the XL formulation

## 2020-04-03 NOTE — Telephone Encounter (Signed)
LMTCB

## 2020-04-04 NOTE — Telephone Encounter (Signed)
I have spoken with patient & advised on glipizide instructions. Pt verbalized understanding of this with no further questions.

## 2020-04-17 ENCOUNTER — Telehealth: Payer: Self-pay

## 2020-04-17 ENCOUNTER — Other Ambulatory Visit: Payer: Self-pay | Admitting: Family

## 2020-04-17 NOTE — Telephone Encounter (Signed)
Patient returned phone call.  He was sleeping at time of eent, does not recall any symptoms that night.    He notes that his Carvedilol dosage was decreased from 12.5mg  BID ~ 2 months ago.    Educated pt on DMV restrictions regarding unable to drive for 6 months.  Pt plans to keep appt on 05/17/20 with Dr. Quentin Ore.    Advised I would forward info to Dr. Quentin Ore anticipate continued monitoring.

## 2020-04-17 NOTE — Telephone Encounter (Signed)
Alert transmission for ATP delivered to convert arrhythmia.  1 VT recorded event with ATP delivered - EGM appeared sudden onset tachycardia, ATP delivered successful for conversion to sinus rhythm.  Also of note additional 1 VT event no therapy duration 19 second - EGM appears sudden onset tachycardia average v rate 191bpm  and  1 VF event - EGM unavailable for viewing.  Will continue to monitor.     Attempted to reach pt, no answer, Left detailed message on VM (DPR on file) requesting callback with device clinic # and hours.   Med include- Carvedilol 6.25mg  BID Pt is scheduled for OV with Dr. Quentin Ore on 05/17/20.

## 2020-04-19 ENCOUNTER — Emergency Department: Payer: Medicare Other

## 2020-04-19 ENCOUNTER — Telehealth: Payer: Self-pay | Admitting: Cardiovascular Disease

## 2020-04-19 ENCOUNTER — Emergency Department
Admission: EM | Admit: 2020-04-19 | Discharge: 2020-04-20 | Disposition: A | Discharge status: Home / Self Care | Payer: Medicare Other | Attending: Emergency Medicine | Admitting: Emergency Medicine

## 2020-04-19 ENCOUNTER — Other Ambulatory Visit: Payer: Self-pay

## 2020-04-19 DIAGNOSIS — F17290 Nicotine dependence, other tobacco product, uncomplicated: Secondary | ICD-10-CM | POA: Diagnosis not present

## 2020-04-19 DIAGNOSIS — I472 Ventricular tachycardia, unspecified: Secondary | ICD-10-CM

## 2020-04-19 DIAGNOSIS — N3000 Acute cystitis without hematuria: Secondary | ICD-10-CM | POA: Diagnosis not present

## 2020-04-19 DIAGNOSIS — I5022 Chronic systolic (congestive) heart failure: Secondary | ICD-10-CM | POA: Insufficient documentation

## 2020-04-19 DIAGNOSIS — Z7982 Long term (current) use of aspirin: Secondary | ICD-10-CM | POA: Diagnosis not present

## 2020-04-19 DIAGNOSIS — Z7902 Long term (current) use of antithrombotics/antiplatelets: Secondary | ICD-10-CM | POA: Diagnosis not present

## 2020-04-19 DIAGNOSIS — Z7984 Long term (current) use of oral hypoglycemic drugs: Secondary | ICD-10-CM | POA: Insufficient documentation

## 2020-04-19 DIAGNOSIS — Z79899 Other long term (current) drug therapy: Secondary | ICD-10-CM | POA: Diagnosis not present

## 2020-04-19 DIAGNOSIS — I2510 Atherosclerotic heart disease of native coronary artery without angina pectoris: Secondary | ICD-10-CM | POA: Diagnosis not present

## 2020-04-19 DIAGNOSIS — E119 Type 2 diabetes mellitus without complications: Secondary | ICD-10-CM | POA: Diagnosis not present

## 2020-04-19 DIAGNOSIS — I110 Hypertensive heart disease with heart failure: Secondary | ICD-10-CM | POA: Insufficient documentation

## 2020-04-19 DIAGNOSIS — R42 Dizziness and giddiness: Secondary | ICD-10-CM | POA: Diagnosis not present

## 2020-04-19 LAB — CBC
HCT: 41.2 % (ref 39.0–52.0)
Hemoglobin: 14.6 g/dL (ref 13.0–17.0)
MCH: 30.5 pg (ref 26.0–34.0)
MCHC: 35.4 g/dL (ref 30.0–36.0)
MCV: 86.2 fL (ref 80.0–100.0)
Platelets: 163 10*3/uL (ref 150–400)
RBC: 4.78 MIL/uL (ref 4.22–5.81)
RDW: 13.8 % (ref 11.5–15.5)
WBC: 7.6 10*3/uL (ref 4.0–10.5)
nRBC: 0 % (ref 0.0–0.2)

## 2020-04-19 LAB — URINALYSIS, COMPLETE (UACMP) WITH MICROSCOPIC
Bilirubin Urine: NEGATIVE
Glucose, UA: NEGATIVE mg/dL
Ketones, ur: NEGATIVE mg/dL
Nitrite: NEGATIVE
Protein, ur: NEGATIVE mg/dL
Specific Gravity, Urine: 1.014 (ref 1.005–1.030)
Squamous Epithelial / HPF: NONE SEEN (ref 0–5)
pH: 6 (ref 5.0–8.0)

## 2020-04-19 LAB — BASIC METABOLIC PANEL
Anion gap: 10 (ref 5–15)
BUN: 23 mg/dL (ref 8–23)
CO2: 22 mmol/L (ref 22–32)
Calcium: 9.4 mg/dL (ref 8.9–10.3)
Chloride: 106 mmol/L (ref 98–111)
Creatinine, Ser: 0.88 mg/dL (ref 0.61–1.24)
GFR, Estimated: >60 mL/min (ref >60)
Glucose, Bld: 170 mg/dL — ABNORMAL HIGH (ref 70–99)
Potassium: 3.8 mmol/L (ref 3.5–5.1)
Sodium: 138 mmol/L (ref 135–145)

## 2020-04-19 LAB — MAGNESIUM: Magnesium: 1.3 mg/dL — ABNORMAL LOW (ref 1.7–2.4)

## 2020-04-19 LAB — TROPONIN I (HIGH SENSITIVITY): Troponin I (High Sensitivity): 12 ng/L (ref <18)

## 2020-04-19 MED ORDER — MAGNESIUM SULFATE 2 GM/50ML IV SOLN: 2.0000 g | Freq: Once | INTRAVENOUS | Status: DC

## 2020-04-19 MED ORDER — CEPHALEXIN 500 MG PO CAPS: 500.0000 mg | ORAL_CAPSULE | Freq: Four times a day (QID) | ORAL | 0 refills | Status: AC

## 2020-04-19 MED ORDER — MAGNESIUM SULFATE 4 GM/100ML IV SOLN
4.0000 g | Freq: Once | INTRAVENOUS | Status: AC
  Administered 2020-04-19: 4 g via INTRAVENOUS
  Filled 2020-04-19: qty 100

## 2020-04-19 MED ORDER — POTASSIUM CHLORIDE CRYS ER 20 MEQ PO TBCR
40.0000 meq | EXTENDED_RELEASE_TABLET | Freq: Once | ORAL | Status: AC
  Administered 2020-04-19: 40 meq via ORAL
  Filled 2020-04-19: qty 2

## 2020-04-19 NOTE — ED Notes (Signed)
Patient declination of repeat troponin discussed with Dr. Tamala Julian, no new orders received.

## 2020-04-19 NOTE — ED Notes (Signed)
ED Provider at bedside. 

## 2020-04-19 NOTE — ED Provider Notes (Signed)
[facility] Emergency Department Provider Note  ____________________________________________   Event Date/Time   First MD Initiated Contact with Patient 04/19/20 2022     (approximate)  I have reviewed the triage vital signs and the nursing notes.   HISTORY  Chief Complaint Dizziness   HPI [name] [name] is a 80 y.o. male no past medical history of CAD, V. tach status post AICD, HTN, HDL, kidney stones, arthritis, DM and GERD as well as aortic atherosclerosis who presents for assessment of 2 dizziness episodes he experienced earlier today.  This lasted about 10 to 20 seconds and resolved without any interventions.  Patient states he was at rest when both of these occurred.  They were couple hours apart.  He did not have any associated pain, nausea, vomiting, shortness of breath, cough or any other associate symptoms.  No recent fevers, chills, headache earache, sore throat, vomiting, diarrhea, dysuria, rash, abdominal pain back pain, extremity pain recent falls or injuries.  Patient states he had lowered his dose of hydrochlorothiazide a last couple days because his blood pressures at home have been quite low.  No other acute concerns at this time.          Past Medical History:  Diagnosis Date  . Aortic insufficiency    a. noted on TTE 2015; b. 06/2019 Echo: AI not visualized.  . Arthritis   . CAD (coronary artery disease)    a. remote PCI in 1991 and 2005; b. MV 3/15: old inferior MI, no ischemia, LVEF 50%, slight inferior wall hypokniesis; c. 06/2019 NSTEMI/PCI: LM nl, LAD 80p/m (Atherectomy & 4.5x18 Resolute Onyx DES), 17m/d, D1 75 (PTCA), RI patent stent, LCX nl, RCA 100p, RPAV fills via L->R collats from LCX.  [name] Chicken pox   . Colon polyps    4 pre-cancerous   . Diverticulitis   . DM type 2 (diabetes mellitus, type 2) ([place])   . Family history of adverse reaction to anesthesia   . GERD (gastroesophageal reflux disease)   . Heart murmur   .  History of kidney stones   . HOH (hard of hearing)   . Hyperlipidemia   . Hypertension   . Ischemic cardiomyopathy    a. TTE 2015: EF  50-55%, mild global HK; b. 06/2019 Echo: EF 45-50%, Gr1 DD, basal inf AK. Triv MR.  . Kidney stones   . Melanoma ([place]) 1980   Resected from his back  . Mitral regurgitation    a. noted on TTE 2015  . Myocardial infarction [facility])     Patient Active Problem List   Diagnosis Date Noted  . Fatigue 03/31/2020  . Malfunction of implantable defibrillator ventricular (ICD) lead 02/15/2020  . Cardiomyopathy ([place])   . Chronic systolic heart failure ([place]) 02/02/2020  . Mural thrombus of left ventricle 02/02/2020  . ICD (implantable cardioverter-defibrillator) in place 02/02/2020  . Ischemic cardiomyopathy   . VT (ventricular tachycardia) ([place]) 07/09/2019  . NSTEMI (non-ST elevated myocardial infarction) ([place]) 07/07/2019  . De Quervain's tenosynovitis, left 05/17/2019  . Esophagitis, unspecified without bleeding   . Esophageal ulcer 11/20/2018  . Acute left flank pain 10/23/2018  . Post-nasal drip 10/23/2018  . Allergic rhinitis 10/23/2018  . Right shoulder pain 07/03/2018  . Skin lesion 06/12/2018  . Pruritus 06/12/2018  . Kidney stones, calcium oxalate 05/28/2018  . Atherosclerosis of native coronary artery with stable angina pectoris ([place]) 05/17/2018  . GERD (gastroesophageal reflux disease) 02/23/2018  . SCC (squamous cell carcinoma) 05/15/2017  . Cervical stenosis  of spine 05/15/2017  . Tobacco abuse 01/26/2017  . Cardiac murmur 01/08/2017  . Right renal mass 10/31/2016  . Abdominal pain 10/31/2016  . Fatty liver 10/31/2016  . Hematuria 10/31/2016  . Acute non-recurrent maxillary sinusitis 10/17/2015  . Carotid artery disease ([place]) 10/03/2015  . Atherosclerosis of abdominal aorta ([place]) 07/28/2015  . CAD (coronary artery disease) 07/10/2015  . Essential hypertension 07/10/2015  . Mixed hyperlipidemia 07/10/2015  . Controlled type 2 diabetes  mellitus with complication, without long-term current use of insulin ([place]) 07/10/2015  . History of melanoma 07/10/2015  . Chronic low back pain 07/10/2015  . BPH (benign prostatic hyperplasia) 07/10/2015  . Bilateral hand numbness 07/10/2015  . Insomnia 07/10/2015    Past Surgical History:  Procedure Laterality Date  . CHOLECYSTECTOMY  2012  . COLONOSCOPY     in 2003 with polyp removed and leak anastomosis had to have open abdominal surgery   . CORONARY ANGIOPLASTY WITH STENT PLACEMENT  1991 & 2005  . CORONARY ATHERECTOMY N/A 07/08/2019   Procedure: CORONARY ATHERECTOMY;  Surgeon: Jettie Booze, MD;  Location: [place] [facility];  Service: Cardiovascular;  Laterality: N/A;  . CORONARY BALLOON ANGIOPLASTY N/A 07/08/2019   Procedure: CORONARY BALLOON ANGIOPLASTY;  Surgeon: Jettie Booze, MD;  Location: [place] [facility];  Service: Cardiovascular;  Laterality: N/A;  diagonal   . CORONARY STENT INTERVENTION N/A 07/08/2019   Procedure: CORONARY STENT INTERVENTION;  Surgeon: Jettie Booze, MD;  Location: [place] [facility];  Service: Cardiovascular;  Laterality: N/A;  lad  . CYSTOSCOPY/URETEROSCOPY/HOLMIUM LASER/STENT PLACEMENT Right 01/02/2018   Procedure: CYSTOSCOPY/URETEROSCOPY/HOLMIUM LASER/STENT PLACEMENT;  Surgeon: [name], MD;  Location: [facility];  Service: Urology;  Laterality: Right;  . ESOPHAGOGASTRODUODENOSCOPY (EGD) WITH PROPOFOL N/A 11/19/2016   Procedure: ESOPHAGOGASTRODUODENOSCOPY (EGD) WITH PROPOFOL;  Surgeon: [name], MD;  Location: [facility];  Service: Endoscopy;  Laterality: N/A;  . ESOPHAGOGASTRODUODENOSCOPY (EGD) WITH PROPOFOL N/A 02/02/2019   Procedure: ESOPHAGOGASTRODUODENOSCOPY (EGD) WITH PROPOFOL;  Surgeon: [name], MD;  Location: [facility];  Service: Endoscopy;  Laterality: N/A;  . ICD IMPLANT N/A 02/02/2020   Procedure: ICD IMPLANT;  Surgeon: Vickie Epley, MD;  Location: [place] [facility];  Service:  Cardiovascular;  Laterality: N/A;  . INTRAVASCULAR PRESSURE WIRE/FFR STUDY N/A 07/07/2019   Procedure: INTRAVASCULAR PRESSURE WIRE/FFR STUDY;  Surgeon: Nelva Bush, MD;  Location: [place] [facility];  Service: Cardiovascular;  Laterality: N/A;  . INTRAVASCULAR ULTRASOUND/IVUS N/A 07/08/2019   Procedure: Intravascular Ultrasound/IVUS;  Surgeon: Jettie Booze, MD;  Location: [place] [facility];  Service: Cardiovascular;  Laterality: N/A;  . LEAD REVISION/REPAIR N/A 02/16/2020   Procedure: LEAD REVISION/REPAIR;  Surgeon: Constance Haw, MD;  Location: [place] [facility];  Service: Cardiovascular;  Laterality: N/A;  . LEFT HEART CATH N/A 07/08/2019   Procedure: Left Heart Cath;  Surgeon: Jettie Booze, MD;  Location: [place] [facility];  Service: Cardiovascular;  Laterality: N/A;  . LEFT HEART CATH AND CORONARY ANGIOGRAPHY N/A 07/07/2019   Procedure: LEFT HEART CATH AND CORONARY ANGIOGRAPHY;  Surgeon: Nelva Bush, MD;  Location: [place] [facility];  Service: Cardiovascular;  Laterality: N/A;  . LITHOTRIPSY  2015  . MELANOMA EXCISION  1980   malignant  . NERVE SURGERY  2015   ulna nerve  . TONSILLECTOMY  1945  . WISDOM TOOTH EXTRACTION      Prior to Admission medications   Medication Sig Start Date End Date Taking? Authorizing Provider  cephALEXin (KEFLEX) 500 MG capsule  Take 1 capsule (500 mg total) by mouth 4 (four) times daily for 5 days. 04/19/20 04/24/20 Yes Lucrezia Starch, MD  acetaminophen (TYLENOL) 500 MG tablet Take 1,000 mg by mouth every 6 (six) hours as needed (pain.).    [provider]  aspirin EC 81 MG tablet Take 1 tablet (81 mg total) by mouth daily. Swallow whole. 03/20/20   [name], MD  carvedilol (COREG) 6.25 MG tablet Take 1 tablet (6.25 mg total) by mouth 2 (two) times daily. 11/16/19   Vickie Epley, MD  clopidogrel (PLAVIX) 75 MG tablet TAKE 1 TABLET(75 MG TOTAL) BY MOUTH DAILY WITH BREAKFAST 01/04/20   [name], MD   ezetimibe (ZETIA) 10 MG tablet TAKE 1 TABLET(10 MG) BY MOUTH DAILY 02/10/20   [name], MD  fluticasone (FLONASE) 50 MCG/ACT nasal spray Place into both nostrils daily as needed for allergies or rhinitis.    [provider]  glipiZIDE (GLUCOTROL) 10 MG tablet Take 1 tablet (10 mg total) by mouth 2 (two) times daily before a meal. Take with largest meals. 04/03/20   [name], [name]  hydrochlorothiazide (HYDRODIURIL) 12.5 MG tablet Take 1 tablet (12.5 mg total) by mouth daily. 03/31/20   [name], [name]  hydrochlorothiazide (HYDRODIURIL) 25 MG tablet TAKE 1 TABLET(25 MG) BY MOUTH DAILY 04/17/20   [name], [name]  lisinopril (ZESTRIL) 10 MG tablet TAKE 1 TABLET BY MOUTH DAILY 04/17/20   [name], [name]  Multiple Vitamin (MULTIVITAMIN WITH MINERALS) TABS tablet Take 1 tablet by mouth daily with lunch.    [provider]  nitroGLYCERIN (NITROSTAT) 0.4 MG SL tablet Place 1 tablet (0.4 mg total) under the tongue every 5 (five) minutes x 3 doses as needed for chest pain. 07/09/19   Tommie Raymond, NP  Omega-3 Fatty Acids (FISH OIL) 1000 MG CAPS Take 1,000 mg by mouth daily with lunch.    [provider]  [facility] VERIO test strip 2 (two) times daily. 08/08/19   [provider]  pantoprazole (PROTONIX) 40 MG tablet Take 1 tablet (40 mg total) by mouth daily before breakfast. 12/17/19   [name], MD  Polyvinyl Alcohol-Povidone (MURINE TEARS FOR DRY EYES OP) Place 1-2 drops into both eyes as needed (for dry eyes).     [provider]  Probiotic Product (PROBIOTIC PO) Take 1 capsule by mouth every evening.    [provider]  psyllium (METAMUCIL) 58.6 % powder Take 1 packet by mouth at bedtime.    [provider]  rosuvastatin (CRESTOR) 10 MG tablet Take 10 mg by mouth daily.    [provider]  Semaglutide, 1 MG/DOSE, (OZEMPIC, 1 MG/DOSE,) 4 MG/3ML SOPN ADMINISTER 1 MG UNDER THE SKIN 1 TIME A WEEK  01/03/20   [name], [name]  sodium chloride (OCEAN) 0.65 % SOLN nasal spray Place 1 spray into both nostrils as needed for congestion.    [provider]  spironolactone (ALDACTONE) 25 MG tablet Take 1 tablet (25 mg total) by mouth daily. 12/07/19   [name], MD  tamsulosin (FLOMAX) 0.4 MG CAPS capsule TAKE 1 CAPSULE(0.4 MG) BY MOUTH DAILY 08/25/19   [name], MD  traMADol (ULTRAM) 50 MG tablet TAKE 1 TABLET BY MOUTH EVERY 12 HOURS AS NEEDED 03/13/20   [name], [name]    Allergies Metformin and related, Azithromycin, Other, Percocet [oxycodone-acetaminophen], and Jardiance [empagliflozin]  Family History  Problem Relation Age of Onset  .  Hyperlipidemia Mother   . Hypertension Mother   . Heart disease Mother   . Diabetes Mother   . Heart attack Mother   . Colon cancer Father   . Lung cancer Father   . Kidney cancer Father        malignant capsulated kidney tumor  . Diabetes Father   . Liver cancer Father   . Bladder Cancer Neg Hx   . Prostate cancer Neg Hx     Social History Social History   Tobacco Use  . Smoking status: Current Some Day Smoker    Types: Pipe  . Smokeless tobacco: Former Systems developer    Quit date: 10/17/2015  Vaping Use  . Vaping Use: Never used  Substance Use Topics  . Alcohol use: Not Currently    Alcohol/week: 0.0 - 1.0 standard drinks  . Drug use: No    Review of Systems  Review of Systems  Constitutional: Negative for chills and fever.  HENT: Negative for sore throat.   Eyes: Negative for pain.  Respiratory: Negative for cough and stridor.   Cardiovascular: Negative for chest pain.  Gastrointestinal: Negative for vomiting.  Genitourinary: Negative for dysuria.  Musculoskeletal: Negative for myalgias.  Skin: Negative for rash.  Neurological: Positive for dizziness. Negative for seizures, loss of consciousness and headaches.  Psychiatric/Behavioral: Negative for suicidal ideas.  All other systems reviewed and  are negative.     ____________________________________________   PHYSICAL EXAM:  VITAL SIGNS: ED Triage Vitals [04/19/20 1710]  Enc Vitals Group     BP 102/69     Pulse Rate 74     Resp 16     Temp 98.2 F (36.8 C)     Temp Source Oral     SpO2 93 %     Weight 194 lb (88 kg)     Height 6\' 1"  (1.854 m)     Head Circumference      Peak Flow      Pain Score 0     Pain Loc      Pain Edu?      Excl. in [place]?    Vitals:   04/19/20 2205 04/19/20 2230  BP: 126/75 137/74  Pulse: (!) 55 (!) 59  Resp: 17 19  Temp:    SpO2: 97% 96%   Physical Exam Vitals and nursing note reviewed.  Constitutional:      Appearance: He is well-developed.  HENT:     Head: Normocephalic and atraumatic.     Right Ear: External ear normal.     Left Ear: External ear normal.     Nose: Nose normal.  Eyes:     Conjunctiva/sclera: Conjunctivae normal.  Cardiovascular:     Rate and Rhythm: Normal rate and regular rhythm.     Heart sounds: No murmur heard.   Pulmonary:     Effort: Pulmonary effort is normal. No respiratory distress.     Breath sounds: Normal breath sounds.  Abdominal:     Palpations: Abdomen is soft.     Tenderness: There is no abdominal tenderness.  Musculoskeletal:     Cervical back: Neck supple.  Skin:    General: Skin is warm and dry.     Capillary Refill: Capillary refill takes less than 2 seconds.  Neurological:     Mental Status: He is alert and oriented to person, place, and time.  Psychiatric:        Mood and Affect: Mood normal.      ____________________________________________   LABS (all  labs ordered are listed, but only abnormal results are displayed)  Labs Reviewed  BASIC METABOLIC PANEL - Abnormal; Notable for the following components:      Result Value   Glucose, Bld 170 (*)    All other components within normal limits  URINALYSIS, COMPLETE (UACMP) WITH MICROSCOPIC - Abnormal; Notable for the following components:   Color, Urine YELLOW (*)     APPearance HAZY (*)    Hgb urine dipstick MODERATE (*)    Leukocytes,Ua TRACE (*)    Bacteria, UA RARE (*)    All other components within normal limits  MAGNESIUM - Abnormal; Notable for the following components:   Magnesium 1.3 (*)    All other components within normal limits  URINE CULTURE  CBC  TROPONIN I (HIGH SENSITIVITY)  TROPONIN I (HIGH SENSITIVITY)   ____________________________________________  EKG  Sinus rhythm with ventricular rate of 77, left axis deviation, QRS is 130 is nonspecific ST changes in lead I, aVL, and lead III.  On extended lead V5.  Some intermittent pacer spikes seen nonconducted. ____________________________________________  RADIOLOGY  ED MD interpretation: AICD in place.  No large effusion, edema, infarct or any other clear acute thoracic process.   Official radiology report(s): DG Chest 2 View  Result Date: 04/19/2020 CLINICAL DATA:  Dizziness EXAM: CHEST - 2 VIEW COMPARISON:  02/16/2020 FINDINGS: The heart size and mediastinal contours are within normal limits. Both lungs are clear. The visualized skeletal structures are unremarkable. There is a left-sided ICD in place. Aortic calcifications are noted. IMPRESSION: No active cardiopulmonary disease. Electronically Signed   By: Constance Holster M.D.   On: 04/19/2020 20:41    ____________________________________________   PROCEDURES  Procedure(s) performed (including Critical Care):  .1-3 Lead EKG Interpretation Performed by: Lucrezia Starch, MD Authorized by: Lucrezia Starch, MD     Interpretation: normal     ECG rate assessment: normal     Rhythm: sinus rhythm       ____________________________________________   INITIAL IMPRESSION / ASSESSMENT AND PLAN / ED COURSE      Patient presents with above-stated history exam for assessment of 2 episodes of dizziness earlier today.  He also notes that he was notified by [employer] that he had some event 2 days ago but is not sure  what this was.  He had no other associated symptoms today and both episodes resolved without intervention.  He is afebrile and hemodynamically stable on arrival.  Differential includes metabolic derangements, symptomatic arrhythmia, ACS, heart failure, dehydration and orthostatic dizziness, vasovagal and to medic anemia.  ECG obtained shows sinus rhythm with no clear acute ischemia and given troponin of 12 tingling 3 hours after symptom onset of low suspicion for ACS.  Chest x-ray is unremarkable and is no evidence of pneumonia or acute volume overload or other clear acute process.  AICD appears in appropriate position.  CBC shows no leukocytosis or acute anemia.  BMP shows glucose of 170 with no other significant electrolyte or metabolic derangements.  Potassium is 3.8.  Magnesium is 1.3.  Urinalysis appears consistent with UTI with moderate hemoglobin and trace leukocyte esterase with 21-50 RBCs and 11-20 WBCs and rare bacteria. urine culture sent.  Pacemaker interrogated and per report patient had 2 approximately 10-20 beat episodes of V. tach earlier today and an episode 2 days ago and both were terminated by the AICD without any other events.  Discussed patient's presentation work-up and concerns with on-call cardiologist Dr. Haroldine Laws recommended giving the patient 40 mEq  of potassium and 4 g of magnesium but did not advise hospitalization given otherwise reassuring work-up recommended close outpatient cardiology follow-up.  Magnesium potassium repleted.  Rx written for Keflex to cover possible cystitis.  Advised patient of cardiology recommendations and I do think this is reasonable given he is currently asymptomatic otherwise has reassuring exam work-up.  We will plan to discharge after patient has received his magnesium.     ____________________________________________   FINAL CLINICAL IMPRESSION(S) / ED DIAGNOSES  Final diagnoses:  V-tach ([place])  Hypomagnesemia  Acute cystitis without  hematuria    Medications  magnesium sulfate IVPB 4 g 100 mL (has no administration in time range)  potassium chloride SA (KLOR-CON) CR tablet 40 mEq (40 mEq Oral Given 04/19/20 2226)     ED Discharge Orders         Ordered    cephALEXin (KEFLEX) 500 MG capsule  4 times daily        04/19/20 2254           Note:  This document was prepared using Dragon voice recognition software and may include unintentional dictation errors.   Lucrezia Starch, MD 04/19/20 2330

## 2020-04-19 NOTE — ED Notes (Signed)
POC discussed with patient. Wife updated by patient via phone. Patient provided a meal tray. 4g Mag requested from pharmacy.

## 2020-04-19 NOTE — ED Notes (Signed)
Patient transported to X-ray 

## 2020-04-19 NOTE — ED Notes (Signed)
This RN to bedside to assess patient, and attempt to collect repeat troponin. Patient requested that this RN "hold off" on collecting repeat lab work until the MD has seen the patient. Patient alert, oriented x4. NADN.

## 2020-04-19 NOTE — ED Notes (Signed)
Family updated as to patient's status. Wife at bedside 

## 2020-04-19 NOTE — Telephone Encounter (Signed)
Patient is currently in the ED, will attempt to contact tomorrow.

## 2020-04-19 NOTE — Telephone Encounter (Signed)
Pt c/o BP issue: STAT if pt c/o blurred vision, one-sided weakness or slurred speech  1. What are your last 5 BP readings? 76/56 now 93/46   2. Are you having any other symptoms (ex. Dizziness, headache, blurred vision, passed out)? Dizziness faint feeling for a few seconds   3. What is your BP issue?  Patient recent device implanted per spouse  Patient doesn't feel right

## 2020-04-19 NOTE — ED Triage Notes (Signed)
Pt reports that he had an "event" at 0200 sat am that his defibrillator responded to, heart rate was 180 and it resolved it, pt was notified by [employer] that it had occurred. Pt states today as he was seated he had an episode of dizziness and feeling out of it, he checked his bp and one was 88 systolic and another 97 systolic, pt reports that the sensation resolved quickly but that he wanted to be seen and thinks he may have had another event that his defibrillator resolved, pt also reports hx of 2 "silent" heart attacks

## 2020-04-19 NOTE — ED Notes (Signed)
Patient denies chest pain at this time. Patient reports around 3pm today, he felt "like I was going to pass out." The patient reports hypotension as well (patient brought a recorded record of blood pressures). Patient reports around 6pm tonight while waiting in the lobby, he felt "faint" again, and was afraid to stand up.

## 2020-04-19 NOTE — Telephone Encounter (Signed)
Mr. Dettman calling in, stated just a few minutes ago he was sitting in his recliner with his feel elevated resting when all of a sudden he had a "strange sensation" and dizziness, reports "I think my defibrillator may have went off, I don't know". Report he took his BP  76/56 HR 80 (left arm) 108/72 HR 78 (right arm) 10 mins later 93/46 HR 83 (left arm)  Pulse ox on RA 95% right hand finger 80% left hand finger, then increased to 90%  While on phone, pt took BP again and read 88/58 HR 81 (left arm)  Pt reports feels "ok" right now, but unsure if there is something going on, the "strange sensation" has him worried and his BP being hypotensive has great concern. ICD placed 02/02/2020 with repair 02/16/20. Advised pt regarding concern for hypotensive then probably need to be evaluated in the ED and will also send message to the device clinic to upload a current strip of his ICD for review. Pt and wife very grateful for the advice and will seek [facility] ED at this time. Will route message to [facility] and Dr. Quentin Ore.

## 2020-04-19 NOTE — ED Notes (Signed)
Patient is resting comfortably. 

## 2020-04-19 NOTE — ED Notes (Signed)
Patient hesitant about IV magnesium. Patient reports he would like to go home, and does not want to be admitted. Dr. Tamala Julian notified to discuss POC with patient.

## 2020-04-19 NOTE — Telephone Encounter (Signed)
Sent message to [place] [facility] scheduler to move follow up.

## 2020-04-20 ENCOUNTER — Telehealth: Payer: Self-pay | Admitting: Cardiology

## 2020-04-20 NOTE — Telephone Encounter (Signed)
Providers schedule is full, Please provide available time to overbook on 4/20 w. Quentin Ore

## 2020-04-20 NOTE — Telephone Encounter (Signed)
Pt with known VT s/p ICD implant.  Message sent to scheduling 04/19/20 to advise to schedule Pt in 1-2 weeks with Dr. Quentin Ore with labs.  Pt will see Dr. Rockey Situ 04/21/20.  Will continue to monitor for follow up appt with Dr. Quentin Ore

## 2020-04-20 NOTE — Progress Notes (Signed)
Date:  04/21/2020   ID:  [name] [name], DOB Apr 06, 1940, MRN [id number]  Patient Location:  [address] [place] [address]   Provider location:   [facility], [place] office  PCP:  [name], [name]  Cardiologist:  Arvid Right [facility]  Chief Complaint  Patient presents with  . [facility] ER Follow up    "doing well." Medications reviewed by the patient verbally.      History of Present Illness:    [name] [name] is a 80 y.o. male  past medical history of CAD ,NSTEMI,  stenting was 3, and  2005 , 4/21, 6/21 VT followed by EP DM II HTN Hyperlipidemia Melanoma Abdominal aorta atherosclerosis April 2021 :atherectomy, drug-eluting stent  STENT RESOLUTE ONYX 4.5X18, postdilated LAD and angioplasty of diagonal ICD 01/2020 for EF 35% to 40% Who presents for routine follow-up of his coronary artery disease  Recently seen in the emergency room April 19, 2020 2 episodes of dizziness lasting 10 to 20 seconds He was given supplemental potassium and magnesium Keflex for possible cystitis Ventricular tachyarrhythmia noted on pacer interrogation  It would appear 1 episode of VT terminated on its own Second episode VT terminated with ATP 1 episode VF not captured for review  Discussed low magnesium 1.3 Potassium was 3.8 Both were supplemented in the hospital  Reports blood pressure running low, does not drink much fluids  EKG personally reviewed by myself on todays visit Shows normal sinus rhythm rate 75 bpm intraventricular conduction delay, PVCs  Other past medical history reviewed Cath 06/2019:  Catheterization chronically occluded RCA collaterals from left to right, Severe proximal LAD disease 80%, stent placed. Underwent angioplasty of diagonal vessel, 75% lesion Patent stent in the ramus  Echo 12/2019 Left ventricular ejection fraction, by estimation, is 35 to 40%.\ ICD implant 02/02/2020  RV lead thresholds ahd risen significantly, was  decided to pursue lead revision with suspect microdislodgement.  Echocardiogram December 2021 Ejection fraction 35 to 40%  Dr. Quentin Ore noted his eliquis 2/2 LV thrombus that had since resolved and discontinued it.  Lab work reviewed, labs were 2022 hemoglobin A1c 7.1 Total cholesterol 124 LDL Normal CBC, BMP  Non-STEMI June 2021: Hospital Catheterization chronically occluded RCA collaterals from left to right Severe proximal LAD disease 80%, stent placed Angioplasty of diagonal vessel, 75% lesion  ZIO monitor with nonsustained VT, PVCs 10% burden  Cardiac MRI Ejection fraction 35% LGE in the basal and mid inferior and inferior lateral walls, dyskinesis of the basal and inferior inferolateral walls and a left ventricular mural thrombus located on the inferolateral wall.   Past Medical History:  Diagnosis Date  . Aortic insufficiency    a. noted on TTE 2015; b. 06/2019 Echo: AI not visualized.  . Arthritis   . CAD (coronary artery disease)    a. remote PCI in 1991 and 2005; b. MV 3/15: old inferior MI, no ischemia, LVEF 50%, slight inferior wall hypokniesis; c. 06/2019 NSTEMI/PCI: LM nl, LAD 80p/m (Atherectomy & 4.5x18 Resolute Onyx DES), 50m/d, D1 75 (PTCA), RI patent stent, LCX nl, RCA 100p, RPAV fills via L->R collats from LCX.  [name] Chicken pox   . Colon polyps    4 pre-cancerous   . Diverticulitis   . DM type 2 (diabetes mellitus, type 2) ([place])   . Family history of adverse reaction to anesthesia   . GERD (gastroesophageal reflux disease)   . Heart murmur   . History of kidney stones   . HOH (  hard of hearing)   . Hyperlipidemia   . Hypertension   . Ischemic cardiomyopathy    a. TTE 2015: EF  50-55%, mild global HK; b. 06/2019 Echo: EF 45-50%, Gr1 DD, basal inf AK. Triv MR.  . Kidney stones   . Melanoma ([place]) 1980   Resected from his back  . Mitral regurgitation    a. noted on TTE 2015  . Myocardial infarction [facility])    Past Surgical History:  Procedure Laterality  Date  . CHOLECYSTECTOMY  2012  . COLONOSCOPY     in 2003 with polyp removed and leak anastomosis had to have open abdominal surgery   . CORONARY ANGIOPLASTY WITH STENT PLACEMENT  1991 & 2005  . CORONARY ATHERECTOMY N/A 07/08/2019   Procedure: CORONARY ATHERECTOMY;  Surgeon: Jettie Booze, MD;  Location: [place] [facility];  Service: Cardiovascular;  Laterality: N/A;  . CORONARY BALLOON ANGIOPLASTY N/A 07/08/2019   Procedure: CORONARY BALLOON ANGIOPLASTY;  Surgeon: Jettie Booze, MD;  Location: [place] [facility];  Service: Cardiovascular;  Laterality: N/A;  diagonal   . CORONARY STENT INTERVENTION N/A 07/08/2019   Procedure: CORONARY STENT INTERVENTION;  Surgeon: Jettie Booze, MD;  Location: [place] [facility];  Service: Cardiovascular;  Laterality: N/A;  lad  . CYSTOSCOPY/URETEROSCOPY/HOLMIUM LASER/STENT PLACEMENT Right 01/02/2018   Procedure: CYSTOSCOPY/URETEROSCOPY/HOLMIUM LASER/STENT PLACEMENT;  Surgeon: [name], MD;  Location: [facility] ORS;  Service: Urology;  Laterality: Right;  . ESOPHAGOGASTRODUODENOSCOPY (EGD) WITH PROPOFOL N/A 11/19/2016   Procedure: ESOPHAGOGASTRODUODENOSCOPY (EGD) WITH PROPOFOL;  Surgeon: [name], MD;  Location: [facility];  Service: Endoscopy;  Laterality: N/A;  . ESOPHAGOGASTRODUODENOSCOPY (EGD) WITH PROPOFOL N/A 02/02/2019   Procedure: ESOPHAGOGASTRODUODENOSCOPY (EGD) WITH PROPOFOL;  Surgeon: [name], MD;  Location: [facility] ENDOSCOPY;  Service: Endoscopy;  Laterality: N/A;  . ICD IMPLANT N/A 02/02/2020   Procedure: ICD IMPLANT;  Surgeon: Vickie Epley, MD;  Location: [place] [facility];  Service: Cardiovascular;  Laterality: N/A;  . INTRAVASCULAR PRESSURE WIRE/FFR STUDY N/A 07/07/2019   Procedure: INTRAVASCULAR PRESSURE WIRE/FFR STUDY;  Surgeon: Nelva Bush, MD;  Location: [place] [facility];  Service: Cardiovascular;  Laterality: N/A;  . INTRAVASCULAR ULTRASOUND/IVUS N/A 07/08/2019   Procedure: Intravascular  Ultrasound/IVUS;  Surgeon: Jettie Booze, MD;  Location: [place] [facility];  Service: Cardiovascular;  Laterality: N/A;  . LEAD REVISION/REPAIR N/A 02/16/2020   Procedure: LEAD REVISION/REPAIR;  Surgeon: Constance Haw, MD;  Location: [place] [facility];  Service: Cardiovascular;  Laterality: N/A;  . LEFT HEART CATH N/A 07/08/2019   Procedure: Left Heart Cath;  Surgeon: Jettie Booze, MD;  Location: [place] [facility];  Service: Cardiovascular;  Laterality: N/A;  . LEFT HEART CATH AND CORONARY ANGIOGRAPHY N/A 07/07/2019   Procedure: LEFT HEART CATH AND CORONARY ANGIOGRAPHY;  Surgeon: Nelva Bush, MD;  Location: [place] [facility];  Service: Cardiovascular;  Laterality: N/A;  . LITHOTRIPSY  2015  . MELANOMA EXCISION  1980   malignant  . NERVE SURGERY  2015   ulna nerve  . TONSILLECTOMY  1945  . WISDOM TOOTH EXTRACTION       Current Meds  Medication Sig  . acetaminophen (TYLENOL) 500 MG tablet Take 1,000 mg by mouth every 6 (six) hours as needed (pain.).  [name] aspirin EC 81 MG tablet Take 1 tablet (81 mg total) by mouth daily. Swallow whole.  . cephALEXin (KEFLEX) 500 MG capsule Take 1 capsule (500 mg total) by mouth 4 (four) times daily for 5 days.  [name]  clopidogrel (PLAVIX) 75 MG tablet TAKE 1 TABLET(75 MG TOTAL) BY MOUTH DAILY WITH BREAKFAST  . ezetimibe (ZETIA) 10 MG tablet TAKE 1 TABLET(10 MG) BY MOUTH DAILY  . fluticasone (FLONASE) 50 MCG/ACT nasal spray Place into both nostrils daily as needed for allergies or rhinitis.  [name] glipiZIDE (GLUCOTROL) 10 MG tablet Take 1 tablet (10 mg total) by mouth 2 (two) times daily before a meal. Take with largest meals.  . Multiple Vitamin (MULTIVITAMIN WITH MINERALS) TABS tablet Take 1 tablet by mouth daily with lunch.  . nitroGLYCERIN (NITROSTAT) 0.4 MG SL tablet Place 1 tablet (0.4 mg total) under the tongue every 5 (five) minutes x 3 doses as needed for chest pain.  . Omega-3 Fatty Acids (FISH OIL) 1000 MG CAPS Take 1,000 mg by  mouth daily with lunch.  Glory Rosebush VERIO test strip 2 (two) times daily.  . pantoprazole (PROTONIX) 40 MG tablet Take 1 tablet (40 mg total) by mouth daily before breakfast.  . Polyvinyl Alcohol-Povidone (MURINE TEARS FOR DRY EYES OP) Place 1-2 drops into both eyes as needed (for dry eyes).   . Probiotic Product (PROBIOTIC PO) Take 1 capsule by mouth every evening.  . psyllium (METAMUCIL) 58.6 % powder Take 1 packet by mouth at bedtime.  . rosuvastatin (CRESTOR) 10 MG tablet Take 10 mg by mouth daily.  . Semaglutide, 1 MG/DOSE, (OZEMPIC, 1 MG/DOSE,) 4 MG/3ML SOPN ADMINISTER 1 MG UNDER THE SKIN 1 TIME A WEEK  . sodium chloride (OCEAN) 0.65 % SOLN nasal spray Place 1 spray into both nostrils as needed for congestion.  . tamsulosin (FLOMAX) 0.4 MG CAPS capsule TAKE 1 CAPSULE(0.4 MG) BY MOUTH DAILY  . traMADol (ULTRAM) 50 MG tablet TAKE 1 TABLET BY MOUTH EVERY 12 HOURS AS NEEDED  . [DISCONTINUED] carvedilol (COREG) 6.25 MG tablet Take 1 tablet (6.25 mg total) by mouth 2 (two) times daily.  . [DISCONTINUED] lisinopril (ZESTRIL) 10 MG tablet TAKE 1 TABLET BY MOUTH DAILY  . [DISCONTINUED] spironolactone (ALDACTONE) 25 MG tablet Take 1 tablet (25 mg total) by mouth daily.     Allergies:   Metformin and related, Azithromycin, Other, Percocet [oxycodone-acetaminophen], and Jardiance [empagliflozin]   Social History   Tobacco Use  . Smoking status: Current Some Day Smoker    Types: Pipe  . Smokeless tobacco: Former Systems developer    Quit date: 10/17/2015  Vaping Use  . Vaping Use: Never used  Substance Use Topics  . Alcohol use: Not Currently    Alcohol/week: 0.0 - 1.0 standard drinks  . Drug use: No     Family Hx: The patient's family history includes Colon cancer in his father; Diabetes in his father and mother; Heart attack in his mother; Heart disease in his mother; Hyperlipidemia in his mother; Hypertension in his mother; Kidney cancer in his father; Liver cancer in his father; Lung cancer in his  father. There is no history of Bladder Cancer or Prostate cancer.  ROS:   Please see the history of present illness.    Review of Systems  Constitutional: Negative.   HENT: Negative.   Respiratory: Negative.   Cardiovascular: Negative.   Gastrointestinal: Negative.   Neurological: Negative.   Psychiatric/Behavioral: Negative.   All other systems reviewed and are negative.    Labs/Other Tests and Data Reviewed:    Recent Labs: 07/07/2019: B Natriuretic Peptide 111.1 03/28/2020: ALT 38; TSH 1.36 04/19/2020: BUN 23; Creatinine, Ser 0.88; Hemoglobin 14.6; Magnesium 1.3; Platelets 163; Potassium 3.8; Sodium 138   Recent Lipid Panel Lab  Results  Component Value Date/Time   CHOL 124 12/21/2019 09:19 AM   TRIG 267 (H) 12/21/2019 09:19 AM   HDL 36 (L) 12/21/2019 09:19 AM   CHOLHDL 3.4 12/21/2019 09:19 AM   LDLCALC 35 12/21/2019 09:19 AM   LDLDIRECT 55.0 02/10/2019 09:03 AM    Wt Readings from Last 3 Encounters:  04/21/20 204 lb 4 oz (92.6 kg)  04/19/20 194 lb (88 kg)  03/28/20 204 lb 3.2 oz (92.6 kg)     Exam:    BP (!) 108/58 (BP Location: Left Arm, Patient Position: Sitting, Cuff Size: Normal)   Pulse 75   Ht 6' (1.829 m)   Wt 204 lb 4 oz (92.6 kg)   SpO2 98%   BMI 27.70 kg/m   Constitutional:  oriented to person, place, and time. No distress.  HENT:  [name]: Grossly normal Eyes:  no discharge. No scleral icterus.  Neck: No JVD, no carotid bruits  Cardiovascular: Regular rate and rhythm, no murmurs appreciated Pulmonary/Chest: Clear to auscultation bilaterally, no wheezes or rails Abdominal: Soft.  no distension.  no tenderness.  Musculoskeletal: Normal range of motion Neurological:  normal muscle tone. Coordination normal. No atrophy Skin: Skin warm and dry Psychiatric: normal affect, pleasant   ASSESSMENT & PLAN:    Sustained VT Recommend he stay hydrated, supplement his magnesium and potassium We will decrease lisinopril down to 5 mg daily, spironolactone 12.5  daily Increase carvedilol back up to 12.5 twice daily For recurrent episodes of VT, may need antiarrhythmic such as amiodarone He has close follow-up with EP  Atherosclerosis of native coronary artery with stable angina pectoris, unspecified whether native or transplanted heart (HCC) Ischemic cardiomyopathy, ejection fraction 35% on MRI Echo 35 to 40% Continue carvedilol spironolactone and lisinopril  changes as above  Cardiomyopathy, ischemic Coreg, lisinopril, spironolactone EF 35 to 40% Unable to transition to Entresto at this time  Nonsustained VT, PVCs Has  ICD, followed EP Lead revision successful Recent sustained VT requiring antitachycardia pacing We will increase his beta-blocker  Atherosclerosis of abdominal aorta (HCC)  Cholesterol is at goal on the current lipid regimen. No changes to the medications were made.  Type 2 diabetes mellitus with other circulatory complication, without long-term current use of insulin (HCC) A1c previous running high, walking program, strict diet recommended A1C 7.1  Carotid artery disease, unspecified laterality (HCC) U/s in 2017 Minor carotid atherosclerosis.  No hemodynamically significant disease  Hospital records reviewed Long discussion with patient and wife, all questions answered  Total encounter time more than 35 minutes  Greater than 50% was spent in counseling and coordination of care with the patient   Signed, Ida Rogue, MD  04/21/2020 6:06 PM    [place] Office [address] [place] [address], [place], [place] [address]

## 2020-04-20 NOTE — Telephone Encounter (Signed)
Patient calling in to report visit ED visit which resulted in a V tach disgnosis. Patient scheduled w. Rockey Situ on 4/8

## 2020-04-21 ENCOUNTER — Other Ambulatory Visit: Payer: Self-pay

## 2020-04-21 ENCOUNTER — Ambulatory Visit: Payer: Medicare Other | Admitting: Cardiovascular Disease

## 2020-04-21 ENCOUNTER — Encounter: Payer: Self-pay | Admitting: Cardiovascular Disease

## 2020-04-21 VITALS — BP 108/58 | HR 75 | Ht 72.0 in | Wt 204.2 lb

## 2020-04-21 DIAGNOSIS — Z95810 Presence of automatic (implantable) cardiac defibrillator: Secondary | ICD-10-CM

## 2020-04-21 DIAGNOSIS — I25118 Atherosclerotic heart disease of native coronary artery with other forms of angina pectoris: Secondary | ICD-10-CM

## 2020-04-21 DIAGNOSIS — I255 Ischemic cardiomyopathy: Secondary | ICD-10-CM | POA: Diagnosis not present

## 2020-04-21 DIAGNOSIS — I700 Atherosclerosis of aorta: Secondary | ICD-10-CM

## 2020-04-21 DIAGNOSIS — I472 Ventricular tachycardia, unspecified: Secondary | ICD-10-CM

## 2020-04-21 DIAGNOSIS — I5022 Chronic systolic (congestive) heart failure: Secondary | ICD-10-CM | POA: Diagnosis not present

## 2020-04-21 LAB — URINE CULTURE: Culture: NO GROWTH

## 2020-04-21 MED ORDER — CARVEDILOL 6.25 MG PO TABS: 12.5000 mg | ORAL_TABLET | Freq: Two times a day (BID) | ORAL | 3 refills | Status: DC

## 2020-04-21 MED ORDER — LISINOPRIL 10 MG PO TABS: 5.0000 mg | ORAL_TABLET | Freq: Every day | ORAL | 3 refills | Status: DC

## 2020-04-21 MED ORDER — SPIRONOLACTONE 25 MG PO TABS: 12.5000 mg | ORAL_TABLET | Freq: Every day | ORAL | 3 refills | Status: DC

## 2020-04-21 NOTE — Telephone Encounter (Signed)
Pt reviewing with ED visit with Dr. Rockey Situ at office visit today Refer to Dr. Rockey Situ progress notes from today's visit.

## 2020-04-21 NOTE — Patient Instructions (Addendum)
Medication Instructions:  Please cut the lisinopril down to 5 mg daily Please cut the spironolactone down to 12.5 mg daily Please increase the coreg up to 12 .5 mg twice a day  If pressure too low, we may need 1 1/2 of the coreg  6.25mg   Magnesium supplement  Can buy over the counter, there are different dosing any is ok for now  Lab work: No new labs needed  Testing/Procedures: No new testing needed   Follow-Up:   . You will need a follow up appointment in 3  months  . Providers on your designated Care Team:   . Murray Hodgkins, NP . Christell Faith, PA-C . Marrianne Mood, PA-C   COVID-19 Vaccine Information can be found at: ShippingScam.co.uk For questions related to vaccine distribution or appointments, please email vaccine@[email or web address] .com or call [phone])[phone].

## 2020-04-24 ENCOUNTER — Telehealth: Payer: Self-pay

## 2020-04-24 ENCOUNTER — Telehealth: Payer: Self-pay | Admitting: Physician Assistant

## 2020-04-24 NOTE — Telephone Encounter (Signed)
Pt left a message stating he was admitted into the hospital for some events and now discharge. He would like the times and dates of his events for the past 7 days. He states he would like for the nurse to call him back. He will not be home between the hours of 12-230 pm.

## 2020-04-24 NOTE — Telephone Encounter (Signed)
Paged by answering service   BP 166/104 in AM BP 104/73 around lunch BP 83/58 @ 5PM BP 77/55 @ 6 pm - felt woozy and lethargic  He took his lisinopril in the morning and spironolactone at noon.  He held his Coreg in afternoon.  During my evaluation around 7:40 PM patient is feeling much better.  Current blood pressure 112/57. Heart rate 74.  I have advised him to continue to monitor his blood pressure.  Recheck around 9 PM.  If systolic blood pressure above 110 he will take Coreg 6.25 mg.   He will call office in the morning before his medication time.

## 2020-04-24 NOTE — Telephone Encounter (Signed)
Reviewed transmission and events from 04/20/20. Questions answered. Reviewed shock plan and [place] driving restrictions.

## 2020-04-30 NOTE — Telephone Encounter (Signed)
Consider checking some orthostatics at home At least sitting and standing

## 2020-05-01 ENCOUNTER — Telehealth: Payer: Self-pay

## 2020-05-01 NOTE — Telephone Encounter (Signed)
Received pt assistance medication. Pt is aware that it is ready for pick up.   Ozempic: 4 boxes

## 2020-05-02 ENCOUNTER — Ambulatory Visit (INDEPENDENT_AMBULATORY_CARE_PROVIDER_SITE_OTHER): Payer: Medicare Other

## 2020-05-02 DIAGNOSIS — I429 Cardiomyopathy, unspecified: Secondary | ICD-10-CM | POA: Diagnosis not present

## 2020-05-02 LAB — CUP PACEART REMOTE DEVICE CHECK
Battery Remaining Longevity: 180 mo
Battery Remaining Percentage: 100 %
Brady Statistic RV Percent Paced: 0 %
Date Time Interrogation Session: 20220419002100
HighPow Impedance: 80 Ohm
Implantable Lead Implant Date: 20220202
Implantable Lead Location: 753860
Implantable Lead Model: 273
Implantable Lead Serial Number: 111255
Implantable Pulse Generator Implant Date: 20220119
Lead Channel Impedance Value: 443 Ohm
Lead Channel Pacing Threshold Amplitude: 0.6 V
Lead Channel Pacing Threshold Pulse Width: 0.4 ms
Lead Channel Setting Pacing Amplitude: 3.5 V
Lead Channel Setting Pacing Pulse Width: 0.4 ms
Lead Channel Setting Sensing Sensitivity: 0.5 mV
Pulse Gen Serial Number: 291993

## 2020-05-03 ENCOUNTER — Other Ambulatory Visit: Payer: Self-pay

## 2020-05-03 ENCOUNTER — Ambulatory Visit (INDEPENDENT_AMBULATORY_CARE_PROVIDER_SITE_OTHER): Payer: Medicare Other | Admitting: Cardiology

## 2020-05-03 ENCOUNTER — Encounter: Payer: Self-pay | Admitting: Cardiology

## 2020-05-03 ENCOUNTER — Ambulatory Visit (INDEPENDENT_AMBULATORY_CARE_PROVIDER_SITE_OTHER): Payer: Medicare Other | Admitting: Pharmacist

## 2020-05-03 VITALS — BP 101/64 | HR 63 | Ht 72.0 in | Wt 204.0 lb

## 2020-05-03 DIAGNOSIS — I25118 Atherosclerotic heart disease of native coronary artery with other forms of angina pectoris: Secondary | ICD-10-CM

## 2020-05-03 DIAGNOSIS — G8929 Other chronic pain: Secondary | ICD-10-CM

## 2020-05-03 DIAGNOSIS — I472 Ventricular tachycardia, unspecified: Secondary | ICD-10-CM

## 2020-05-03 DIAGNOSIS — I10 Essential (primary) hypertension: Secondary | ICD-10-CM | POA: Diagnosis not present

## 2020-05-03 DIAGNOSIS — I255 Ischemic cardiomyopathy: Secondary | ICD-10-CM

## 2020-05-03 DIAGNOSIS — I5022 Chronic systolic (congestive) heart failure: Secondary | ICD-10-CM

## 2020-05-03 DIAGNOSIS — E782 Mixed hyperlipidemia: Secondary | ICD-10-CM

## 2020-05-03 DIAGNOSIS — E1165 Type 2 diabetes mellitus with hyperglycemia: Secondary | ICD-10-CM | POA: Diagnosis not present

## 2020-05-03 DIAGNOSIS — I4729 Other ventricular tachycardia: Secondary | ICD-10-CM

## 2020-05-03 DIAGNOSIS — N400 Enlarged prostate without lower urinary tract symptoms: Secondary | ICD-10-CM

## 2020-05-03 MED ORDER — AMIODARONE HCL 200 MG PO TABS: ORAL_TABLET | ORAL | 3 refills | Status: DC

## 2020-05-03 NOTE — Progress Notes (Signed)
Electrophysiology Office Follow up Visit Note:    Date:  05/03/2020   ID:  Qais Jowers, DOB 22-Jun-1940, MRN [id number]  PCP:  [name], [facility] Cardiologist:  Ida Rogue, MD  [facility] Electrophysiologist:  Vickie Epley, MD    Interval History:    Tuff Clabo is a 80 y.o. male who presents for a follow up visit.  He was last evaluated February 1 when he presented for RV lead revision after his initial implant of his defibrillator on February 02, 2020.  Since implant, the patient has had several episodes of ventricular tachycardia treated with ATP schemes.  The patient was last seen by Dr. Rockey Situ on April 21, 2020.  At that appointment his carvedilol was increased to 12.5 mg twice daily.  The patient tells me that after some recent medication adjustments by Dr. Rockey Situ he experienced orthostatic hypotension.  He self adjusted his medications by decreasing his carvedilol and agree increasing his lisinopril and spironolactone.  These changes significantly improved his symptoms.  He is only taking his carvedilol once a day right now.  He is with his wife today in clinic.  They are walking every day.  They are working on increasing the distance they walk.  Past Medical History:  Diagnosis Date  . Aortic insufficiency    a. noted on TTE 2015; b. 06/2019 Echo: AI not visualized.  . Arthritis   . CAD (coronary artery disease)    a. remote PCI in 1991 and 2005; b. MV 3/15: old inferior MI, no ischemia, LVEF 50%, slight inferior wall hypokniesis; c. 06/2019 NSTEMI/PCI: LM nl, LAD 80p/m (Atherectomy & 4.5x18 Resolute Onyx DES), 41m/d, D1 75 (PTCA), RI patent stent, LCX nl, RCA 100p, RPAV fills via L->R collats from LCX.  [name] Chicken pox   . Colon polyps    4 pre-cancerous   . Diverticulitis   . DM type 2 (diabetes mellitus, type 2) ([place])   . Family history of adverse reaction to anesthesia   . GERD (gastroesophageal reflux disease)   . Heart murmur   .  History of kidney stones   . HOH (hard of hearing)   . Hyperlipidemia   . Hypertension   . Ischemic cardiomyopathy    a. TTE 2015: EF  50-55%, mild global HK; b. 06/2019 Echo: EF 45-50%, Gr1 DD, basal inf AK. Triv MR.  . Kidney stones   . Melanoma ([place]) 1980   Resected from his back  . Mitral regurgitation    a. noted on TTE 2015  . Myocardial infarction [facility])     Past Surgical History:  Procedure Laterality Date  . CHOLECYSTECTOMY  2012  . COLONOSCOPY     in 2003 with polyp removed and leak anastomosis had to have open abdominal surgery   . CORONARY ANGIOPLASTY WITH STENT PLACEMENT  1991 & 2005  . CORONARY ATHERECTOMY N/A 07/08/2019   Procedure: CORONARY ATHERECTOMY;  Surgeon: Jettie Booze, MD;  Location: [place] [facility];  Service: Cardiovascular;  Laterality: N/A;  . CORONARY BALLOON ANGIOPLASTY N/A 07/08/2019   Procedure: CORONARY BALLOON ANGIOPLASTY;  Surgeon: Jettie Booze, MD;  Location: [place] [facility];  Service: Cardiovascular;  Laterality: N/A;  diagonal   . CORONARY STENT INTERVENTION N/A 07/08/2019   Procedure: CORONARY STENT INTERVENTION;  Surgeon: Jettie Booze, MD;  Location: [place] [facility];  Service: Cardiovascular;  Laterality: N/A;  lad  . CYSTOSCOPY/URETEROSCOPY/HOLMIUM LASER/STENT PLACEMENT Right 01/02/2018   Procedure: CYSTOSCOPY/URETEROSCOPY/HOLMIUM LASER/STENT PLACEMENT;  Surgeon:  [name], MD;  Location: [facility];  Service: Urology;  Laterality: Right;  . ESOPHAGOGASTRODUODENOSCOPY (EGD) WITH PROPOFOL N/A 11/19/2016   Procedure: ESOPHAGOGASTRODUODENOSCOPY (EGD) WITH PROPOFOL;  Surgeon: [name], MD;  Location: [facility];  Service: Endoscopy;  Laterality: N/A;  . ESOPHAGOGASTRODUODENOSCOPY (EGD) WITH PROPOFOL N/A 02/02/2019   Procedure: ESOPHAGOGASTRODUODENOSCOPY (EGD) WITH PROPOFOL;  Surgeon: [name], MD;  Location: [facility];  Service: Endoscopy;  Laterality: N/A;  . ICD IMPLANT N/A 02/02/2020   Procedure:  ICD IMPLANT;  Surgeon: Vickie Epley, MD;  Location: [place] [facility];  Service: Cardiovascular;  Laterality: N/A;  . INTRAVASCULAR PRESSURE WIRE/FFR STUDY N/A 07/07/2019   Procedure: INTRAVASCULAR PRESSURE WIRE/FFR STUDY;  Surgeon: Nelva Bush, MD;  Location: [place] [facility];  Service: Cardiovascular;  Laterality: N/A;  . INTRAVASCULAR ULTRASOUND/IVUS N/A 07/08/2019   Procedure: Intravascular Ultrasound/IVUS;  Surgeon: Jettie Booze, MD;  Location: [place] [facility];  Service: Cardiovascular;  Laterality: N/A;  . LEAD REVISION/REPAIR N/A 02/16/2020   Procedure: LEAD REVISION/REPAIR;  Surgeon: Constance Haw, MD;  Location: [place] [facility];  Service: Cardiovascular;  Laterality: N/A;  . LEFT HEART CATH N/A 07/08/2019   Procedure: Left Heart Cath;  Surgeon: Jettie Booze, MD;  Location: [place] [facility];  Service: Cardiovascular;  Laterality: N/A;  . LEFT HEART CATH AND CORONARY ANGIOGRAPHY N/A 07/07/2019   Procedure: LEFT HEART CATH AND CORONARY ANGIOGRAPHY;  Surgeon: Nelva Bush, MD;  Location: [place] [facility];  Service: Cardiovascular;  Laterality: N/A;  . LITHOTRIPSY  2015  . MELANOMA EXCISION  1980   malignant  . NERVE SURGERY  2015   ulna nerve  . TONSILLECTOMY  1945  . WISDOM TOOTH EXTRACTION      Current Medications: Current Meds  Medication Sig  . acetaminophen (TYLENOL) 500 MG tablet Take 1,000 mg by mouth every 6 (six) hours as needed (pain.).  [name] amiodarone (PACERONE) 200 MG tablet Take 2 tablets (400 mg total) by mouth 2 (two) times daily for 5 days, THEN 2 tablets (400 mg total) daily for 5 days, THEN 1 tablet (200 mg total) daily.  [name] aspirin EC 81 MG tablet Take 1 tablet (81 mg total) by mouth daily. Swallow whole.  . carvedilol (COREG) 6.25 MG tablet Take 6.25 mg by mouth daily.  . clopidogrel (PLAVIX) 75 MG tablet TAKE 1 TABLET(75 MG TOTAL) BY MOUTH DAILY WITH BREAKFAST  . ezetimibe (ZETIA) 10 MG tablet TAKE 1 TABLET(10 MG)  BY MOUTH DAILY  . fluticasone (FLONASE) 50 MCG/ACT nasal spray Place into both nostrils daily as needed for allergies or rhinitis.  [name] glipiZIDE (GLUCOTROL) 10 MG tablet Take 1 tablet (10 mg total) by mouth 2 (two) times daily before a meal. Take with largest meals.  [name] lisinopril (ZESTRIL) 10 MG tablet Take 10 mg by mouth daily.  . Multiple Vitamin (MULTIVITAMIN WITH MINERALS) TABS tablet Take 1 tablet by mouth daily with lunch.  . nitroGLYCERIN (NITROSTAT) 0.4 MG SL tablet Place 1 tablet (0.4 mg total) under the tongue every 5 (five) minutes x 3 doses as needed for chest pain.  Glory Rosebush VERIO test strip 2 (two) times daily.  . pantoprazole (PROTONIX) 40 MG tablet Take 1 tablet (40 mg total) by mouth daily before breakfast.  . Polyvinyl Alcohol-Povidone (MURINE TEARS FOR DRY EYES OP) Place 1-2 drops into both eyes as needed (for dry eyes).   . Probiotic Product (PROBIOTIC PO) Take 1 capsule by mouth every evening.  . psyllium (METAMUCIL) 58.6 %  powder Take 1 packet by mouth at bedtime.  . rosuvastatin (CRESTOR) 10 MG tablet Take 10 mg by mouth daily.  . Semaglutide, 1 MG/DOSE, (OZEMPIC, 1 MG/DOSE,) 4 MG/3ML SOPN ADMINISTER 1 MG UNDER THE SKIN 1 TIME A WEEK  . sodium chloride (OCEAN) 0.65 % SOLN nasal spray Place 1 spray into both nostrils as needed for congestion.  [name] spironolactone (ALDACTONE) 25 MG tablet Take 25 mg by mouth daily.  . tamsulosin (FLOMAX) 0.4 MG CAPS capsule TAKE 1 CAPSULE(0.4 MG) BY MOUTH DAILY  . traMADol (ULTRAM) 50 MG tablet TAKE 1 TABLET BY MOUTH EVERY 12 HOURS AS NEEDED  . [DISCONTINUED] carvedilol (COREG) 6.25 MG tablet Take 2 tablets (12.5 mg total) by mouth 2 (two) times daily. (Patient taking differently: Take 12.5 mg by mouth daily in the afternoon.)  . [DISCONTINUED] lisinopril (ZESTRIL) 10 MG tablet Take 0.5 tablets (5 mg total) by mouth daily. (Patient taking differently: Take 10 mg by mouth daily.)  . [DISCONTINUED] spironolactone (ALDACTONE) 25 MG tablet Take 0.5  tablets (12.5 mg total) by mouth daily. (Patient taking differently: Take 25 mg by mouth daily.)     Allergies:   Metformin and related, Azithromycin, Other, Percocet [oxycodone-acetaminophen], and Jardiance [empagliflozin]   Social History   Socioeconomic History  . Marital status: Married    Spouse name: Golden Circle  . Number of children: Not on file  . Years of education: Not on file  . Highest education level: Not on file  Occupational History  . Not on file  Tobacco Use  . Smoking status: Current Some Day Smoker    Types: Pipe  . Smokeless tobacco: Former Systems developer    Quit date: 10/17/2015  Vaping Use  . Vaping Use: Never used  Substance and Sexual Activity  . Alcohol use: Not Currently    Alcohol/week: 0.0 - 1.0 standard drinks  . Drug use: No  . Sexual activity: Not on file  Other Topics Concern  . Not on file  Social History Narrative   Married    Social Determinants of Health   Financial Resource Strain: Medium Risk  . Difficulty of Paying Living Expenses: Somewhat hard  Food Insecurity: Not on file  Transportation Needs: Not on file  Physical Activity: Not on file  Stress: Not on file  Social Connections: Not on file     Family History: The patient's family history includes Colon cancer in his father; Diabetes in his father and mother; Heart attack in his mother; Heart disease in his mother; Hyperlipidemia in his mother; Hypertension in his mother; Kidney cancer in his father; Liver cancer in his father; Lung cancer in his father. There is no history of Bladder Cancer or Prostate cancer.  ROS:   Please see the history of present illness.    All other systems reviewed and are negative.  EKGs/Labs/Other Studies Reviewed:    The following studies were reviewed today:  May 02, 2020 device interrogation Abnormal device interrogation. 4 episodes of VT in April treated successfully with ATP x 1 scheme. Lead parameters. Has follow up scheduled 05/03/2020 in clinic to  discuss pharmacologic suppression of his arrhythmia.  May 03, 2020 device interrogation personally reviewed in clinic Good battery longevity Lead parameters are stable No new device therapies.  Most recent NSVT on April 29, 2020.  Therapies for VT were on April 15, April 14, April 6 x2.  There were also episodes of VT on April 2 x2 . EKG:  The ekg ordered today demonstrates sinus rhythm with an  isolated PVC.  Left axis  Recent Labs: 07/07/2019: B Natriuretic Peptide 111.1 03/28/2020: ALT 38; TSH 1.36 04/19/2020: BUN 23; Creatinine, Ser 0.88; Hemoglobin 14.6; Magnesium 1.3; Platelets 163; Potassium 3.8; Sodium 138  Recent Lipid Panel    Component Value Date/Time   CHOL 124 12/21/2019 0919   TRIG 267 (H) 12/21/2019 0919   HDL 36 (L) 12/21/2019 0919   CHOLHDL 3.4 12/21/2019 0919   VLDL 53 (H) 12/21/2019 0919   LDLCALC 35 12/21/2019 0919   LDLDIRECT 55.0 02/10/2019 0903    Physical Exam:    VS:  BP 101/64   Pulse 63   Ht 6' (1.829 m)   Wt 204 lb (92.5 kg)   SpO2 97%   BMI 27.67 kg/m     Wt Readings from Last 3 Encounters:  05/03/20 204 lb (92.5 kg)  04/21/20 204 lb 4 oz (92.6 kg)  04/19/20 194 lb (88 kg)     GEN:  Well nourished, well developed in no acute distress.  Appears younger than stated age. HEENT: Normal NECK: No JVD; No carotid bruits LYMPHATICS: No lymphadenopathy CARDIAC: RRR, no murmurs, rubs, gallops.  Defibrillator pocket well-healed. RESPIRATORY:  Clear to auscultation without rales, wheezing or rhonchi  ABDOMEN: Soft, non-tender, non-distended MUSCULOSKELETAL:  No edema; No deformity  SKIN: Warm and dry NEUROLOGIC:  Alert and oriented x 3 PSYCHIATRIC:  Normal affect   ASSESSMENT:    1. Chronic systolic heart failure ([place])   2. VT (ventricular tachycardia) ([place])   3. NSVT (nonsustained ventricular tachycardia) ([place])   4. Coronary artery disease of native artery of native heart with stable angina pectoris ([place])    PLAN:    In order of problems  listed above:  1. Chronic systolic heart failure NYHA class II-III.  Warm and dry.  Continue current medications.  Course has been complicated by ventricular tachycardia requiring therapies from his ICD. Will continue his beta-blocker.  He will increase it back to twice daily. We will plan to add amiodarone today.  Start 400 mg by mouth twice daily for 5 days then decrease to 400 mg by mouth daily for 5 days then decrease to 200 mg by mouth daily. We will plan to see him back in 3 months with blood work at that time.  2.  Ventricular tachycardia/NSVT Appropriate therapies from ICD. Amiodarone as above.  3.  Coronary artery disease No ischemic symptoms today.  Continue medical therapy.  Follow-up 3 months.  Medication Adjustments/Labs and Tests Ordered: Current medicines are reviewed at length with the patient today.  Concerns regarding medicines are outlined above.  Orders Placed This Encounter  Procedures  . EKG 12-Lead   Meds ordered this encounter  Medications  . amiodarone (PACERONE) 200 MG tablet    Sig: Take 2 tablets (400 mg total) by mouth 2 (two) times daily for 5 days, THEN 2 tablets (400 mg total) daily for 5 days, THEN 1 tablet (200 mg total) daily.    Dispense:  90 tablet    Refill:  3     Signed, Lars Mage, MD, [facility], [facility] 05/03/2020 2:20 PM    Electrophysiology [facility]

## 2020-05-03 NOTE — Patient Instructions (Addendum)
Medication Instructions:  Your physician has recommended you make the following change in your medication:   1.  START taking amiodarone 200 mg  A.  Take 2 tablets (400 mg) by mouth TWICE a day for 5 days THEN  B.  Take 1 tablet (200 mg) by mouth TWICE a day for 5 days THEN  C.  Take 1 tablet (200 mg) by mouth ONCE a day from then on.  *If you need a refill on your cardiac medications before your next appointment, please call your pharmacy*  Lab Work: None ordered. If you have labs (blood work) drawn today and your tests are completely normal, you will receive your results only by: [name] MyChart Message (if you have MyChart) OR . A paper copy in the mail If you have any lab test that is abnormal or we need to change your treatment, we will call you to review the results.  Testing/Procedures: None ordered.  Follow-Up: At [facility], you and your health needs are our priority.  As part of our continuing mission to provide you with exceptional heart care, we have created designated Provider Care Teams.  These Care Teams include your primary Cardiologist (physician) and Advanced Practice Providers (APPs -  Physician Assistants and Nurse Practitioners) who all work together to provide you with the care you need, when you need it.  Your next appointment:   Your physician wants you to follow-up in: 3 months with Dr. Quentin Ore.   You will receive a reminder letter in the mail two months in advance. If you don't receive a letter, please call our office to schedule the follow-up appointment.  Remote monitoring is used to monitor your ICD from home. This monitoring reduces the number of office visits required to check your device to one time per year. It allows [place] to keep an eye on the functioning of your device to ensure it is working properly. You are scheduled for a device check from home on 08/01/2020. You may send your transmission at any time that day. If you have a wireless device, the  transmission will be sent automatically. After your physician reviews your transmission, you will receive a postcard with your next transmission date.  Amiodarone tablets What is this medicine? AMIODARONE (a MEE oh da rone) is an antiarrhythmic drug. It helps make your heart beat regularly. Because of the side effects caused by this medicine, it is only used when other medicines have not worked. It is usually used for heartbeat problems that may be life threatening. This medicine may be used for other purposes; ask your health care provider or pharmacist if you have questions. COMMON BRAND NAME(S): Cordarone, Pacerone What should I tell my health care provider before I take this medicine? They need to know if you have any of these conditions:  liver disease  lung disease  other heart problems  thyroid disease  an unusual or allergic reaction to amiodarone, iodine, other medicines, foods, dyes, or preservatives  pregnant or trying to get pregnant  breast-feeding How should I use this medicine? Take this medicine by mouth with a glass of water. Follow the directions on the prescription label. You can take this medicine with or without food. However, you should always take it the same way each time. Take your doses at regular intervals. Do not take your medicine more often than directed. Do not stop taking except on the advice of your doctor or health care professional. A special MedGuide will be given to you by  the pharmacist with each prescription and refill. Be sure to read this information carefully each time. Talk to your pediatrician regarding the use of this medicine in children. Special care may be needed. Overdosage: If you think you have taken too much of this medicine contact a poison control center or emergency room at once. NOTE: This medicine is only for you. Do not share this medicine with others. What if I miss a dose? If you miss a dose, take it as soon as you can. If it  is almost time for your next dose, take only that dose. Do not take double or extra doses. What may interact with this medicine? Do not take this medicine with any of the following medications:  abarelix  apomorphine  arsenic trioxide  certain antibiotics like erythromycin, gemifloxacin, levofloxacin, pentamidine  certain medicines for depression like amoxapine, tricyclic antidepressants  certain medicines for fungal infections like fluconazole, itraconazole, ketoconazole, posaconazole, voriconazole  certain medicines for irregular heart beat like disopyramide, dronedarone, ibutilide, propafenone, sotalol  certain medicines for malaria like chloroquine, halofantrine  cisapride  droperidol  haloperidol  hawthorn  maprotiline  methadone  phenothiazines like chlorpromazine, mesoridazine, thioridazine  pimozide  ranolazine  red yeast rice  vardenafil This medicine may also interact with the following medications:  antiviral medicines for HIV or AIDS  certain medicines for blood pressure, heart disease, irregular heart beat  certain medicines for cholesterol like atorvastatin, cerivastatin, lovastatin, simvastatin  certain medicines for hepatitis C like sofosbuvir and ledipasvir; sofosbuvir  certain medicines for seizures like phenytoin  certain medicines for thyroid problems  certain medicines that treat or prevent blood clots like warfarin  cholestyramine  cimetidine  clopidogrel  cyclosporine  dextromethorphan  diuretics  dofetilide  fentanyl  general anesthetics  grapefruit juice  lidocaine  loratadine  methotrexate  other medicines that prolong the QT interval (cause an abnormal heart rhythm)  procainamide  quinidine  rifabutin, rifampin, or rifapentine  St. John's Wort  trazodone  ziprasidone This list may not describe all possible interactions. Give your health care provider a list of all the medicines, herbs,  non-prescription drugs, or dietary supplements you use. Also tell them if you smoke, drink alcohol, or use illegal drugs. Some items may interact with your medicine. What should I watch for while using this medicine? Your condition will be monitored closely when you first begin therapy. Often, this drug is first started in a hospital or other monitored health care setting. Once you are on maintenance therapy, visit your doctor or health care professional for regular checks on your progress. Because your condition and use of this medicine carry some risk, it is a good idea to carry an identification card, necklace or bracelet with details of your condition, medications, and doctor or health care professional. Dennis Bast may get drowsy or dizzy. Do not drive, use machinery, or do anything that needs mental alertness until you know how this medicine affects you. Do not stand or sit up quickly, especially if you are an older patient. This reduces the risk of dizzy or fainting spells. This medicine can make you more sensitive to the sun. Keep out of the sun. If you cannot avoid being in the sun, wear protective clothing and use sunscreen. Do not use sun lamps or tanning beds/booths. You should have regular eye exams before and during treatment. Call your doctor if you have blurred vision, see halos, or your eyes become sensitive to light. Your eyes may get dry. It may be helpful to  use a lubricating eye solution or artificial tears solution. If you are going to have surgery or a procedure that requires contrast dyes, tell your doctor or health care professional that you are taking this medicine. What side effects may I notice from receiving this medicine? Side effects that you should report to your doctor or health care professional as soon as possible:  allergic reactions like skin rash, itching or hives, swelling of the face, lips, or tongue  blue-gray coloring of the skin  blurred vision, seeing blue green  halos, increased sensitivity of the eyes to light  breathing problems  chest pain  dark urine  fast, irregular heartbeat  feeling faint or light-headed  intolerance to heat or cold  nausea or vomiting  pain and swelling of the scrotum  pain, tingling, numbness in feet, hands  redness, blistering, peeling or loosening of the skin, including inside the mouth  spitting up blood  stomach pain  sweating  unusual or uncontrolled movements of body  unusually weak or tired  weight gain or loss  yellowing of the eyes or skin Side effects that usually do not require medical attention (report to your doctor or health care professional if they continue or are bothersome):  change in sex drive or performance  constipation  dizziness  headache  loss of appetite  trouble sleeping This list may not describe all possible side effects. Call your doctor for medical advice about side effects. You may report side effects to FDA at 1-800-FDA-1088. Where should I keep my medicine? Keep out of the reach of children. Store at room temperature between 20 and 25 degrees C (68 and 77 degrees F). Protect from light. Keep container tightly closed. Throw away any unused medicine after the expiration date. NOTE: This sheet is a summary. It may not cover all possible information. If you have questions about this medicine, talk to your doctor, pharmacist, or health care provider.  2021 Elsevier/Gold Standard (2017-12-03 13:44:04)

## 2020-05-03 NOTE — Chronic Care Management (AMB) (Addendum)
  Chronic Care Management Pharmacy Note  05/03/2020 Name:  [name] [name] MRN:  [id number] DOB:  10/06/1940  Subjective: [name] [name] is an 80 y.o. year old male who is a primary patient of [name], Margaret G, [name].  The CCM team was consulted for assistance with disease management and care coordination needs.    Engaged with patient by telephone for follow up visit in response to provider referral for pharmacy case management and/or care coordination services.   Consent to Services:  The patient was given information about Chronic Care Management services, agreed to services, and gave verbal consent prior to initiation of services.  Please see initial visit note for detailed documentation.   Patient Care Team: [name], Margaret G, [name] as PCP - General (Family Medicine) [name], Timothy J, MD as PCP - Cardiology (Cardiology) Lambert, Cameron T, MD as PCP - Electrophysiology (Cardiology) Travis, Catherine E, RPH-CPP as Pharmacist (Pharmacist)  Recent office visits: None since our last call  Recent consult visits:  4/8 - [name] cardiology- focus on hydration, decreased lisinopril and spironolactone, increased carvedilol, consider antiarrhythmic  Hospital visits:  4/6 - ED visit, runs of V-Tach - potassium + magnesium, script for cephalexin for possible cystitis  Objective:  Lab Results  Component Value Date   CREATININE 0.88 04/19/2020   CREATININE 0.83 03/28/2020   CREATININE 0.74 02/16/2020    Lab Results  Component Value Date   HGBA1C 7.3 (H) 03/28/2020   Last diabetic Eye exam:  Lab Results  Component Value Date/Time   HMDIABEYEEXA No Retinopathy 09/10/2019 12:00 AM    Last diabetic Foot exam: No results found for: HMDIABFOOTEX      Component Value Date/Time   CHOL 124 12/21/2019 0919   TRIG 267 (H) 12/21/2019 0919   HDL 36 (L) 12/21/2019 0919   CHOLHDL 3.4 12/21/2019 0919   VLDL 53 (H) 12/21/2019 0919   LDLCALC 35 12/21/2019 0919   LDLDIRECT 55.0  02/10/2019 0903    Hepatic Function Latest Ref Rng & Units 03/28/2020 12/21/2019 07/07/2019  Total Protein 6.0 - 8.3 g/dL 6.6 7.4 7.4  Albumin 3.5 - 5.2 g/dL 4.0 4.1 4.0  AST 0 - 37 U/L 33 33 47(H)  ALT 0 - 53 U/L 38 36 41  Alk Phosphatase 39 - 117 U/L 52 53 55  Total Bilirubin 0.2 - 1.2 mg/dL 1.2 1.7(H) 2.0(H)  Bilirubin, Direct 0.0 - 0.2 mg/dL - 0.2 -    Lab Results  Component Value Date/Time   TSH 1.36 03/28/2020 11:36 AM   TSH 1.006 01/08/2017 11:15 AM   TSH 0.76 12/19/2014 04:39 PM   FREET4 1.05 01/08/2017 11:15 AM    CBC Latest Ref Rng & Units 04/19/2020 03/28/2020 02/16/2020  WBC 4.0 - 10.5 K/uL 7.6 7.8 7.4  Hemoglobin 13.0 - 17.0 g/dL 14.6 14.4 14.7  Hematocrit 39.0 - 52.0 % 41.2 40.9 42.1  Platelets 150 - 400 K/uL 163 148.0(L) 158    Lab Results  Component Value Date/Time   VD25OH 33.0 01/08/2017 11:15 AM    Clinical ASCVD: Yes  The ASCVD Risk score (Goff DC Jr., et al., 2013) failed to calculate for the following reasons:   The 2013 ASCVD risk score is only valid for ages 40 to 79   The patient has a prior MI or stroke diagnosis      Social History   Tobacco Use  Smoking Status Current Some Day Smoker  . Types: Pipe  Smokeless Tobacco Former User  . Quit date: 10/17/2015   BP Readings   from Last 3 Encounters:  04/21/20 (!) 108/58  04/20/20 129/79  03/28/20 102/62   Pulse Readings from Last 3 Encounters:  04/21/20 75  04/20/20 64  03/28/20 61   Wt Readings from Last 3 Encounters:  04/21/20 204 lb 4 oz (92.6 kg)  04/19/20 194 lb (88 kg)  03/28/20 204 lb 3.2 oz (92.6 kg)    Assessment: Review of patient past medical history, allergies, medications, health status, including review of consultants reports, laboratory and other test data, was performed as part of comprehensive evaluation and provision of chronic care management services.   SDOH:  (Social Determinants of Health) assessments and interventions performed:    CCM Care Plan  Allergies   Allergen Reactions  . Metformin And Related Diarrhea and Other (See Comments)    Leg cramps, also  . Azithromycin Other (See Comments)    Not recommended - no reaction  . Other Other (See Comments)    If taking antibiotics for awhile, thrush results  . Percocet [Oxycodone-Acetaminophen] Other (See Comments)    Hallucinations  . Jardiance [Empagliflozin] Other (See Comments)    Tongue itching/reaction    Medications Reviewed Today    Reviewed by De Hollingshead, RPH-CPP (Pharmacist) on 05/03/20 at 1042  Med List Status: <None>  Medication Order Taking? Sig Documenting Provider Last Dose Status Informant  acetaminophen (TYLENOL) 500 MG tablet [id number] No Take 1,000 mg by mouth every 6 (six) hours as needed (pain.).  Patient not taking: Reported on 05/03/2020   [provider] Not Taking Active Self  aspirin EC 81 MG tablet [id number] Yes Take 1 tablet (81 mg total) by mouth daily. Swallow whole. [name], MD Taking Active   carvedilol (COREG) 6.25 MG tablet [id number] Yes Take 2 tablets (12.5 mg total) by mouth 2 (two) times daily. [name], MD Taking Active            Med Note Darnelle Maffucci, Arville Lime   Wed May 03, 2020 10:34 AM) Taking 6.25 mg QAM  clopidogrel (PLAVIX) 75 MG tablet [id number] Yes TAKE 1 TABLET(75 MG TOTAL) BY MOUTH DAILY WITH BREAKFAST [name], [name], MD Taking Active   ezetimibe (ZETIA) 10 MG tablet [id number] Yes TAKE 1 TABLET(10 MG) BY MOUTH DAILY [name], [name], MD Taking Active   fluticasone (FLONASE) 50 MCG/ACT nasal spray [id number] Yes Place into both nostrils daily as needed for allergies or rhinitis. [provider] Taking Active   glipiZIDE (GLUCOTROL) 10 MG tablet [id number] Yes Take 1 tablet (10 mg total) by mouth 2 (two) times daily before a meal. Take with largest meals. [name], [name] Taking Active   lisinopril (ZESTRIL) 10 MG tablet [id number] Yes Take 0.5 tablets (5 mg total) by mouth daily. [name],  MD Taking Active            Med Note Darnelle Maffucci, Arville Lime   Wed May 03, 2020 10:34 AM) Taking 10 mg daily   Multiple Vitamin (MULTIVITAMIN WITH MINERALS) TABS tablet [id number] Yes Take 1 tablet by mouth daily with lunch. [provider] Taking Active Self  nitroGLYCERIN (NITROSTAT) 0.4 MG SL tablet [id number] No Place 1 tablet (0.4 mg total) under the tongue every 5 (five) minutes x 3 doses as needed for chest pain.  Patient not taking: Reported on 05/03/2020   Tommie Raymond, NP Not Taking Active Self  Roma Schanz test strip [id number] Yes 2 (two) times daily. [provider] Taking Active Self  pantoprazole (PROTONIX) 40 MG tablet [id number] Yes  Take 1 tablet (40 mg total) by mouth daily before breakfast. Wohl, Darren, MD Taking Active Self  Polyvinyl Alcohol-Povidone (MURINE TEARS FOR DRY EYES OP) [id number] Yes Place 1-2 drops into both eyes as needed (for dry eyes).  [provider] Taking Active Self  Probiotic Product (PROBIOTIC PO) [id number] Yes Take 1 capsule by mouth every evening. [provider] Taking Active Self  psyllium (METAMUCIL) 58.6 % powder [id number] Yes Take 1 packet by mouth at bedtime. [provider] Taking Active Self           Med Note (DALY, KATHY N   Wed Jul 07, 2019  6:13 PM)    rosuvastatin (CRESTOR) 10 MG tablet [id number] Yes Take 10 mg by mouth daily. [provider] Taking Active   Semaglutide, 1 MG/DOSE, (OZEMPIC, 1 MG/DOSE,) 4 MG/3ML SOPN [id number] Yes ADMINISTER 1 MG UNDER THE SKIN 1 TIME A WEEK [name], Margaret G, [name] Taking Active   sodium chloride (OCEAN) 0.65 % SOLN nasal spray [id number] Yes Place 1 spray into both nostrils as needed for congestion. [provider] Taking Active Self  spironolactone (ALDACTONE) 25 MG tablet [id number] Yes Take 0.5 tablets (12.5 mg total) by mouth daily. [name], Timothy J, MD Taking Active            Med Note (TRAVIS, CATHERINE E   Wed May 03, 2020 10:34 AM)  Taking 25 mg daily  tamsulosin (FLOMAX) 0.4 MG CAPS capsule [id number] Yes TAKE 1 CAPSULE(0.4 MG) BY MOUTH DAILY Sninsky, Brian C, MD Taking Active   traMADol (ULTRAM) 50 MG tablet [id number] Yes TAKE 1 TABLET BY MOUTH EVERY 12 HOURS AS NEEDED [name], Margaret G, [name] Taking Active           Patient Active Problem List   Diagnosis Date Noted  . Fatigue 03/31/2020  . Malfunction of implantable defibrillator ventricular (ICD) lead 02/15/2020  . Cardiomyopathy (HCC)   . Chronic systolic heart failure (HCC) 02/02/2020  . Mural thrombus of left ventricle 02/02/2020  . ICD (implantable cardioverter-defibrillator) in place 02/02/2020  . Ischemic cardiomyopathy   . VT (ventricular tachycardia) (HCC) 07/09/2019  . NSTEMI (non-ST elevated myocardial infarction) (HCC) 07/07/2019  . De Quervain's tenosynovitis, left 05/17/2019  . Esophagitis, unspecified without bleeding   . Esophageal ulcer 11/20/2018  . Acute left flank pain 10/23/2018  . Post-nasal drip 10/23/2018  . Allergic rhinitis 10/23/2018  . Right shoulder pain 07/03/2018  . Skin lesion 06/12/2018  . Pruritus 06/12/2018  . Kidney stones, calcium oxalate 05/28/2018  . Atherosclerosis of native coronary artery with stable angina pectoris (HCC) 05/17/2018  . GERD (gastroesophageal reflux disease) 02/23/2018  . SCC (squamous cell carcinoma) 05/15/2017  . Cervical stenosis of spine 05/15/2017  . Tobacco abuse 01/26/2017  . Cardiac murmur 01/08/2017  . Right renal mass 10/31/2016  . Abdominal pain 10/31/2016  . Fatty liver 10/31/2016  . Hematuria 10/31/2016  . Acute non-recurrent maxillary sinusitis 10/17/2015  . Carotid artery disease (HCC) 10/03/2015  . Atherosclerosis of abdominal aorta (HCC) 07/28/2015  . CAD (coronary artery disease) 07/10/2015  . Essential hypertension 07/10/2015  . Mixed hyperlipidemia 07/10/2015  . Controlled type 2 diabetes mellitus with complication, without long-term current use of insulin (HCC)  07/10/2015  . History of melanoma 07/10/2015  . Chronic low back pain 07/10/2015  . BPH (benign prostatic hyperplasia) 07/10/2015  . Bilateral hand numbness 07/10/2015  . Insomnia 07/10/2015    Immunization History  Administered Date(s) Administered  . Influenza, High Dose Seasonal PF 11/01/2015, 10/20/2017, 09/28/2019  .   Influenza,inj,Quad PF,6+ Mos 10/08/2016  . Influenza-Unspecified 10/01/2018  . PFIZER(Purple Top)SARS-COV-2 Vaccination 02/04/2019, 02/25/2019, 10/12/2019    Conditions to be addressed/monitored: CAD, HTN, HLD and DMII  Care Plan : Medication Management  Updates made by De Hollingshead, RPH-CPP since 05/03/2020 12:00 AM    Problem: Diabetes, CAD, HTN, HLD     Long-Range Goal: Disease Progression Prevention   This Visit's Progress: On track  Recent Progress: On track  Priority: High  Note:   Current Barriers:  . Unable to independently afford treatment regimen . Unable to achieve control of diabetes   Pharmacist Clinical Goal(s):  [name] Over the next 90 days, patient will verbalize ability to afford treatment regimen . Over the next 90 days, patient will maintain control of diabetes as evidenced by A1c  through collaboration with PharmD and provider.   Interventions: . 1:1 collaboration with [name], [name] regarding development and update of comprehensive plan of care as evidenced by provider attestation and co-signature . Inter-disciplinary care team collaboration (see longitudinal plan of care) . Comprehensive medication review performed; medication list updated in electronic medical record  Diabetes: . Uncontrolled, but more relaxed goal of A1c <7.5% with no low blood sugars is likely appropriate given age, comorbidities; current treatment: Ozempic 1 mg weekly, glipizide 10 mg BID o Has been unable to tolerate metformin, IR and ER, d/t diarrhea.  o Did not tolerate Jardiance d/t tongue itching/sores, avoid other SGLT2 for risk of allergic  reaction . Current glucose readings: reports all readings, fasting and post prandial, between 115-150s.  . Denies any concerns w/ hypoglycemia . Continue current regimen at this time. Continue to monitor for hypoglycemia . Due for DM foot exam with next visit   Hypertension, Ventricular Ectopy, s/p . Controlled; current treatment: carvedilol 12.5 mg BID- self decreased to 6.25 mg QAM in the past week due to hypotension, spironolactone 12.5 mg QAM - self increased to 25 mg QAM when he decreased carvedilol, lisinopril 5 mg QAM - self increased back to 10 mg QAM when he decreased carvedilol . S/p ICD placement: Primary cardiologist Dr. Rockey Situ; EP Dr. Quentin Ore.  . Reports the following orthostatic readings: laying down - 125/60 HR 61, sitting - 120/82 HR 79, standing - 116/76 HR 77; laying down - 131/71 HR 62, sitting - 126/91 HR 66, standing - 128/81 HR 84 . Appointment this afternoon with Dr. Quentin Ore. Plans to take orthostatic readings with him. Mention of possibility of anti-arrhythmic.   Hyperlipidemia and ASCVD risk reduction: [name] Very well controlled on last lab work, but not checked on current regimen; current treatment: rosuvastatin 10 mg daily, ezetimibe 10 mg daily   . Antiplatelet regimen: clopidogrel 75 mg daily  . Recommended to continue current regimen at this time. F/u lipid panel.   BPH: . Controlled per patient report; current regimen: tamsulosin 0.4 mg daily . Recommended to continue current regimen at this time  GERD: . Controlled per patient report; current regimen: pantoprazole 40 mg daily . Recommended to continue current regimen at this time  Chronic Pain; . Controlled per patient report; current regimen: tramadol 50 mg BID; no need for PRN acetaminophen lately . Patient reports he plans to try to wean down on tramadol. Discussed use of acetaminophen. Encouraged communication with PCP  Supplements: Probiotic, Multivitamin  Patient Goals/Self-Care Activities . Over the  next 90 days, patient will:  - take medications as prescribed check glucose daily, document, and provide at future appointments check blood pressure daily, document, and provide at future  appointments collaborate with provider on medication access solutions  Follow Up Plan: Telephone follow up appointment with care management team member scheduled for: ~ 12 weeks      Medication Assistance: Ozempic obtained through [facility] medication assistance program.  Enrollment ends 12/13/20  Patient's preferred pharmacy is:  [id number] [address], [place] [address] Phone: [phone] Fax: [phone]  Uses pill box? Yes Pt endorses 100% compliance  Follow Up:  Patient agrees to Care Plan and Follow-up.  Plan: Telephone follow up appointment with care management team member scheduled for:  ~ 36 weeks  [name], PharmD, [place], CPP Clinical Pharmacist [place] [facility] [phone]   Encounter details: CCM Time Spent      Value Time User   Time spent with patient (minutes)  29 05/03/2020 11:01 AM De Hollingshead, RPH-CPP   Time spent performing Chart review  10 05/03/2020 11:01 AM De Hollingshead, RPH-CPP   Total time (minutes)  39 05/03/2020 11:01 AM De Hollingshead, RPH-CPP     Moderate to High Complex Decision Making      Value Time User   Moderate to High complex decision making  No 05/03/2020 11:01 AM De Hollingshead, RPH-CPP     CCM Services: This encounter meets routine CCM services.  Prior to outreach and patient consent for Chronic Care Management, I referred this patient for services after reviewing the nominated patient list or from a personal encounter with the patient.  Blood pressure stable. Seeing dr Quentin Ore today. Will follow  I have personally reviewed this encounter including the documentation in this note and have  collaborated with the care management provider regarding care management and care coordination activities to include development and update of the comprehensive care plan. I am certifying that I agree with the content of this note and encounter as supervising physician. [name], NP

## 2020-05-03 NOTE — Patient Instructions (Signed)
Visit Information  PATIENT GOALS: Goals Addressed              This Visit's Progress     Patient Stated   .  Medication Monitoring (pt-stated)        Patient Goals/Self-Care Activities . Over the next 90 days, patient will:  - take medications as prescribed check glucose daily, document, and provide at future appointments check blood pressure daily, document, and provide at future appointments collaborate with provider on medication access solutions       Patient verbalizes understanding of instructions provided today and agrees to view in [place].   Plan: Telephone follow up appointment with care management team member scheduled for:  ~ 12 weeks  [name], PharmD, [place], [place] Clinical Pharmacist [employer] [facility] [employer] [phone]

## 2020-05-06 ENCOUNTER — Other Ambulatory Visit: Payer: Self-pay | Admitting: Cardiovascular Disease

## 2020-05-06 ENCOUNTER — Other Ambulatory Visit: Payer: Self-pay | Admitting: Family

## 2020-05-06 DIAGNOSIS — G8929 Other chronic pain: Secondary | ICD-10-CM

## 2020-05-08 ENCOUNTER — Telehealth: Payer: Self-pay

## 2020-05-08 DIAGNOSIS — I5022 Chronic systolic (congestive) heart failure: Secondary | ICD-10-CM

## 2020-05-08 DIAGNOSIS — I472 Ventricular tachycardia, unspecified: Secondary | ICD-10-CM

## 2020-05-08 NOTE — Telephone Encounter (Signed)
RX Refill:tramadol Last Seen:03-28-20 Last ordered:03-13-20

## 2020-05-08 NOTE — Telephone Encounter (Signed)
[name] [name] MyCharted his orthostatic BP & HR readings over the weekend  Estill Bamberg / Dr.[name]:  RE: Blood Pressure readings;  Prone                                 Sitting                                       Standing 125/60      Pulse 61             120/82     Pulse 68                  116/76      Pulse 80 131/71              62               126/91               66                  128/81                84 115/64               64               104/72               73                  100/66                85  Noted drop in BP upon standing position, HR increased as well, appears orthostatic Routed message to Dr. Rockey Situ for recommendations. Will f/u w/pt once Dr. Rockey Situ reviews.

## 2020-05-11 MED ORDER — MIDODRINE HCL 5 MG PO TABS: 5.0000 mg | ORAL_TABLET | Freq: Two times a day (BID) | ORAL | 3 refills | Status: DC | PRN

## 2020-05-11 NOTE — Addendum Note (Signed)
Addended by: Wynema Birch on: 05/11/2020 05:21 PM   Modules accepted: Orders

## 2020-05-11 NOTE — Telephone Encounter (Signed)
Sent [name] [name] MyChart response back to our conversation regarding his orthostatic BP readings. Dr. Rockey Situ advised  It would appear that 1 out of 3 blood pressures dropped other to look pretty good What I would probably recommend with days where her blood pressure does drop less than 110 when standing I would increase fluid intake, also take midodrine 5 mg as needed that morning with possible repeat dose 2 PM if blood pressure continues to drop Midodrine will help support his blood pressure but does not need it every day only for systolic pressure less than 110 when standing  Midodrine 5 mg BID PRN sent in to pharmacy.

## 2020-05-11 NOTE — Telephone Encounter (Signed)
It would appear that 1 out of 3 blood pressures dropped other to look pretty good What I would probably recommend with days where her blood pressure does drop less than 110 when standing I would increase fluid intake, also take midodrine 5 mg as needed that morning with possible repeat dose 2 PM if blood pressure continues to drop Midodrine will help support his blood pressure but does not need it every day only for systolic pressure less than 110 when standing

## 2020-05-16 ENCOUNTER — Telehealth: Payer: Self-pay | Admitting: Emergency Medicine

## 2020-05-16 NOTE — Telephone Encounter (Addendum)
LMOM to call the device clinic, # and office hours provided on messsage.  Assess for s/sx during VT episodes 05/15/20 that was terminated successfully by ATP x 1 at 0454. Medications include amiodorone 200 mg daily and Coreg 6.25 mg BID. Educate on [place] driving restrictions.

## 2020-05-16 NOTE — Telephone Encounter (Signed)
Patient returned call and reports he was asymptomatic on 05/15/20 at time of event. He reports he did not start amiodorone after last visit with DR Quentin Ore  due to concerns about the side affects listed. He reports he was waiting for a response to his mychart message from Dr Quentin Ore about having lab work and his concerns about the amiodorone. Patient informed that a response was sent today and that he will be scheduled for lab work with follow-up in 2 weeks. Patient will start amiodorone 400 mg ( 2 tablets)today for 5 days, then  take 400 mg( 2 tablets) daily for 5 days, then he will take 200 mg daily.

## 2020-05-16 NOTE — Telephone Encounter (Signed)
Pt returning nurse phone call.

## 2020-05-17 ENCOUNTER — Telehealth: Payer: Self-pay

## 2020-05-17 ENCOUNTER — Encounter: Payer: Medicare Other | Admitting: Cardiology

## 2020-05-17 ENCOUNTER — Ambulatory Visit (INDEPENDENT_AMBULATORY_CARE_PROVIDER_SITE_OTHER): Payer: Medicare Other | Admitting: Pharmacist

## 2020-05-17 DIAGNOSIS — I255 Ischemic cardiomyopathy: Secondary | ICD-10-CM | POA: Diagnosis not present

## 2020-05-17 DIAGNOSIS — I10 Essential (primary) hypertension: Secondary | ICD-10-CM

## 2020-05-17 DIAGNOSIS — E782 Mixed hyperlipidemia: Secondary | ICD-10-CM

## 2020-05-17 DIAGNOSIS — N400 Enlarged prostate without lower urinary tract symptoms: Secondary | ICD-10-CM

## 2020-05-17 DIAGNOSIS — I25118 Atherosclerotic heart disease of native coronary artery with other forms of angina pectoris: Secondary | ICD-10-CM

## 2020-05-17 DIAGNOSIS — E1165 Type 2 diabetes mellitus with hyperglycemia: Secondary | ICD-10-CM | POA: Diagnosis not present

## 2020-05-17 NOTE — Patient Instructions (Signed)
Visit Information  PATIENT GOALS: Goals Addressed              This Visit's Progress     Patient Stated   .  Medication Monitoring (pt-stated)        Patient Goals/Self-Care Activities . Over the next 90 days, patient will:  - take medications as prescribed check glucose daily, document, and provide at future appointments check blood pressure daily, document, and provide at future appointments collaborate with provider on medication access solutions        Patient verbalizes understanding of instructions provided today and agrees to view in [place].   Plan: Telephone follow up appointment with care management team member scheduled for:  ~ 10 weeks as previously scheduled   [name], PharmD, [place], [place] Clinical Pharmacist [employer] at [employer] [phone]

## 2020-05-17 NOTE — Telephone Encounter (Signed)
Patient called into the device clinic today to advise the he was asleep in his recliner today asleep and his device woke him up beeping. Patient remains asymptomatic. Transmission received and shows 3 new ATP events since 05/16/20. Patient was contacted 05/16/20 about previous from 05/15/20 but was unaware of any new events. Refer to note on 05/16/20 for Amio start.Consulted with [place] rep, Joey and we are in agreeable that his device remote transmission does not show any alerts as to why it is beeping. Per Heron Sabins, he recommends patient keeping a log of dates and times if he beeps any further and call the device clinic back to let us know.   Patient reports compliance including yesterday was his first day to start. [place] driving restrictions discussed with patient along with shock plan.

## 2020-05-17 NOTE — Telephone Encounter (Signed)
Patient called in stating it was a false alarm the sound he heard came from the tv a medical commercial. I advised patient I will let nurses know and they will call back if they need too

## 2020-05-17 NOTE — Chronic Care Management (AMB) (Addendum)
Chronic Care Management Pharmacy Note  05/17/2020 Name:  Audrick Lamoureaux MRN:  [id number] DOB:  1940-12-25  Subjective: Aloysious Vangieson is an 80 y.o. year old male who is a primary patient of Vidal Schwalbe, [name], [name].  The CCM team was consulted for assistance with disease management and care coordination needs.    Engaged with patient by telephone for reponse to call with medication questions in response to provider referral for pharmacy case management and/or care coordination services.   Consent to Services:  The patient was given information about Chronic Care Management services, agreed to services, and gave verbal consent prior to initiation of services.  Please see initial visit note for detailed documentation.   Patient Care Team: [name], [name] as PCP - General (Family Medicine) [name], MD as PCP - Cardiology (Cardiology) Vickie Epley, MD as PCP - Electrophysiology (Cardiology) De Hollingshead, RPH-CPP as Pharmacist (Pharmacist)  Recent office visits: None since our last call  Recent consult visits:  4/20 Quentin Ore EP - carvedilol BID, amiodarone 400 mg BID x 5 then 400 mg daily x 5 then 200 mg daily   4/25 - phone w/ [name] - added midodrine on days where systolic >299 when standing, allowed to take second dose later in the day if needed  4/25 - mychart w/ Quentin Ore, dicussed warnings w/ amiodarone, discussion to check lab work and have virtual visit for more discussion  Hospital visits:  4/6 - ED visit, runs of V-Tach - potassium + magnesium, script for cephalexin for possible cystitis   Objective:  Lab Results  Component Value Date   CREATININE 0.88 04/19/2020   CREATININE 0.83 03/28/2020   CREATININE 0.74 02/16/2020    Lab Results  Component Value Date   HGBA1C 7.3 (H) 03/28/2020   Last diabetic Eye exam:  Lab Results  Component Value Date/Time   HMDIABEYEEXA No Retinopathy 09/10/2019 12:00 AM    Last diabetic Foot  exam: No results found for: HMDIABFOOTEX      Component Value Date/Time   CHOL 124 12/21/2019 0919   TRIG 267 (H) 12/21/2019 0919   HDL 36 (L) 12/21/2019 0919   CHOLHDL 3.4 12/21/2019 0919   VLDL 53 (H) 12/21/2019 0919   LDLCALC 35 12/21/2019 0919   LDLDIRECT 55.0 02/10/2019 0903    Hepatic Function Latest Ref Rng & Units 03/28/2020 12/21/2019 07/07/2019  Total Protein 6.0 - 8.3 g/dL 6.6 7.4 7.4  Albumin 3.5 - 5.2 g/dL 4.0 4.1 4.0  AST 0 - 37 U/L 33 33 47(H)  ALT 0 - 53 U/L 38 36 41  Alk Phosphatase 39 - 117 U/L 52 53 55  Total Bilirubin 0.2 - 1.2 mg/dL 1.2 1.7(H) 2.0(H)  Bilirubin, Direct 0.0 - 0.2 mg/dL - 0.2 -    Lab Results  Component Value Date/Time   TSH 1.36 03/28/2020 11:36 AM   TSH 1.006 01/08/2017 11:15 AM   TSH 0.76 12/19/2014 04:39 PM   FREET4 1.05 01/08/2017 11:15 AM    CBC Latest Ref Rng & Units 04/19/2020 03/28/2020 02/16/2020  WBC 4.0 - 10.5 K/uL 7.6 7.8 7.4  Hemoglobin 13.0 - 17.0 g/dL 14.6 14.4 14.7  Hematocrit 39.0 - 52.0 % 41.2 40.9 42.1  Platelets 150 - 400 K/uL 163 148.0(L) 158    Lab Results  Component Value Date/Time   VD25OH 33.0 01/08/2017 11:15 AM    Clinical ASCVD: Yes  The ASCVD Risk score Mikey Bussing DC Jr., et al., 2013) failed to calculate for the following reasons:  The 2013 ASCVD risk score is only valid for ages 84 to 103   The patient has a prior MI or stroke diagnosis     Social History   Tobacco Use  Smoking Status Current Some Day Smoker  . Types: Pipe  Smokeless Tobacco Former Systems developer  . Quit date: 10/17/2015   BP Readings from Last 3 Encounters:  05/03/20 101/64  04/21/20 (!) 108/58  04/20/20 129/79   Pulse Readings from Last 3 Encounters:  05/03/20 63  04/21/20 75  04/20/20 64   Wt Readings from Last 3 Encounters:  05/03/20 204 lb (92.5 kg)  04/21/20 204 lb 4 oz (92.6 kg)  04/19/20 194 lb (88 kg)    Assessment: Review of patient past medical history, allergies, medications, health status, including review of consultants  reports, laboratory and other test data, was performed as part of comprehensive evaluation and provision of chronic care management services.   SDOH:  (Social Determinants of Health) assessments and interventions performed:  SDOH Interventions   Flowsheet Row Most Recent Value  SDOH Interventions   Financial Strain Interventions Other (Comment)  [manufacturer assistance]      CCM Care Plan  Allergies  Allergen Reactions  . Metformin And Related Diarrhea and Other (See Comments)    Leg cramps, also  . Azithromycin Other (See Comments)    Not recommended - no reaction  . Other Other (See Comments)    If taking antibiotics for awhile, thrush results  . Percocet [Oxycodone-Acetaminophen] Other (See Comments)    Hallucinations  . Jardiance [Empagliflozin] Other (See Comments)    Tongue itching/reaction    Medications Reviewed Today    Reviewed by Jeremy Johann, CMA (Certified Medical Assistant) on 05/03/20 at 1326  Med List Status: <None>  Medication Order Taking? Sig Documenting Provider Last Dose Status Informant  acetaminophen (TYLENOL) 500 MG tablet [id number] Yes Take 1,000 mg by mouth every 6 (six) hours as needed (pain.). [provider] Taking Active   aspirin EC 81 MG tablet [id number]  Take 1 tablet (81 mg total) by mouth daily. Swallow whole. [name], MD  Active   carvedilol (COREG) 6.25 MG tablet [id number]  Take 2 tablets (12.5 mg total) by mouth 2 (two) times daily.  Patient taking differently: Take 12.5 mg by mouth daily in the afternoon.   [name], MD  Active            Med Note Constance Haw, JACQUELINE L   Wed May 03, 2020  1:24 PM)    clopidogrel (PLAVIX) 75 MG tablet [id number]  TAKE 1 TABLET(75 MG TOTAL) BY MOUTH DAILY WITH BREAKFAST [name], [name], MD  Active   ezetimibe (ZETIA) 10 MG tablet [id number]  TAKE 1 TABLET(10 MG) BY MOUTH DAILY [name], [name], MD  Active   fluticasone (FLONASE) 50 MCG/ACT nasal spray [id number]  Place  into both nostrils daily as needed for allergies or rhinitis. [provider]  Active   glipiZIDE (GLUCOTROL) 10 MG tablet [id number]  Take 1 tablet (10 mg total) by mouth 2 (two) times daily before a meal. Take with largest meals. [name], [name]  Active   lisinopril (ZESTRIL) 10 MG tablet [id number]  Take 0.5 tablets (5 mg total) by mouth daily.  Patient taking differently: Take 10 mg by mouth daily.   [name], MD  Active            Med Note Constance Haw, JACQUELINE L   Wed May 03, 2020  1:25 PM)  Multiple Vitamin (MULTIVITAMIN WITH MINERALS) TABS tablet [id number]  Take 1 tablet by mouth daily with lunch. [provider]  Active Self  nitroGLYCERIN (NITROSTAT) 0.4 MG SL tablet [id number] Yes Place 1 tablet (0.4 mg total) under the tongue every 5 (five) minutes x 3 doses as needed for chest pain. Tommie Raymond, NP Taking Active   [facility] VERIO test strip [id number]  2 (two) times daily. [provider]  Active Self  pantoprazole (PROTONIX) 40 MG tablet [id number]  Take 1 tablet (40 mg total) by mouth daily before breakfast. [name], MD  Active Self  Polyvinyl Alcohol-Povidone (MURINE TEARS FOR DRY EYES OP) [id number]  Place 1-2 drops into both eyes as needed (for dry eyes).  [provider]  Active Self  Probiotic Product (PROBIOTIC PO) [id number]  Take 1 capsule by mouth every evening. [provider]  Active Self  psyllium (METAMUCIL) 58.6 % powder [id number]  Take 1 packet by mouth at bedtime. [provider]  Active Self           Med Note Duffy [name], [name]   Wed Jul 07, 2019  6:13 PM)    rosuvastatin (CRESTOR) 10 MG tablet [id number]  Take 10 mg by mouth daily. [provider]  Active   Semaglutide, 1 MG/DOSE, (OZEMPIC, 1 MG/DOSE,) 4 MG/3ML SOPN [id number]  ADMINISTER 1 MG UNDER THE SKIN 1 TIME A WEEK [name], [name], [name]  Active   sodium chloride (OCEAN) 0.65 % SOLN nasal spray [id number]  Place 1 spray into both  nostrils as needed for congestion. [provider]  Active Self  spironolactone (ALDACTONE) 25 MG tablet [id number]  Take 0.5 tablets (12.5 mg total) by mouth daily. [name], MD  Active            Med Note Constance Haw, JACQUELINE L   Wed May 03, 2020  1:26 PM)    tamsulosin (FLOMAX) 0.4 MG CAPS capsule [id number]  TAKE 1 CAPSULE(0.4 MG) BY MOUTH DAILY [name], MD  Active   traMADol (ULTRAM) 50 MG tablet [id number]  TAKE 1 TABLET BY MOUTH EVERY 12 HOURS AS NEEDED [name], [name]  Active           Patient Active Problem List   Diagnosis Date Noted  . Fatigue 03/31/2020  . Malfunction of implantable defibrillator ventricular (ICD) lead 02/15/2020  . Cardiomyopathy ([place])   . Chronic systolic heart failure ([place]) 02/02/2020  . Mural thrombus of left ventricle 02/02/2020  . ICD (implantable cardioverter-defibrillator) in place 02/02/2020  . Ischemic cardiomyopathy   . VT (ventricular tachycardia) ([place]) 07/09/2019  . NSTEMI (non-ST elevated myocardial infarction) ([place]) 07/07/2019  . De Quervain's tenosynovitis, left 05/17/2019  . Esophagitis, unspecified without bleeding   . Esophageal ulcer 11/20/2018  . Acute left flank pain 10/23/2018  . Post-nasal drip 10/23/2018  . Allergic rhinitis 10/23/2018  . Right shoulder pain 07/03/2018  . Skin lesion 06/12/2018  . Pruritus 06/12/2018  . Kidney stones, calcium oxalate 05/28/2018  . Atherosclerosis of native coronary artery with stable angina pectoris ([place]) 05/17/2018  . GERD (gastroesophageal reflux disease) 02/23/2018  . SCC (squamous cell carcinoma) 05/15/2017  . Cervical stenosis of spine 05/15/2017  . Tobacco abuse 01/26/2017  . Cardiac murmur 01/08/2017  . Right renal mass 10/31/2016  . Abdominal pain 10/31/2016  . Fatty liver 10/31/2016  . Hematuria 10/31/2016  . Acute non-recurrent maxillary sinusitis 10/17/2015  . Carotid artery disease ([place]) 10/03/2015  . Atherosclerosis of abdominal aorta ([place])  07/28/2015  .  CAD (coronary artery disease) 07/10/2015  . Essential hypertension 07/10/2015  . Mixed hyperlipidemia 07/10/2015  . Controlled type 2 diabetes mellitus with complication, without long-term current use of insulin ([place]) 07/10/2015  . History of melanoma 07/10/2015  . Chronic low back pain 07/10/2015  . BPH (benign prostatic hyperplasia) 07/10/2015  . Bilateral hand numbness 07/10/2015  . Insomnia 07/10/2015    Immunization History  Administered Date(s) Administered  . Influenza, High Dose Seasonal PF 11/01/2015, 10/20/2017, 09/28/2019  . Influenza,inj,Quad PF,6+ Mos 10/08/2016  . Influenza-Unspecified 10/01/2018  . PFIZER(Purple Top)SARS-COV-2 Vaccination 02/04/2019, 02/25/2019, 10/12/2019    Conditions to be addressed/monitored: HTN, HLD and DMII  Care Plan : Medication Management  Updates made by De Hollingshead, RPH-CPP since 05/17/2020 12:00 AM    Problem: Diabetes, CAD, HTN, HLD     Long-Range Goal: Disease Progression Prevention   This Visit's Progress: On track  Recent Progress: On track  Priority: High  Note:   Current Barriers:  . Unable to independently afford treatment regimen . Unable to achieve control of diabetes   Pharmacist Clinical Goal(s):  [name] Over the next 90 days, patient will verbalize ability to afford treatment regimen . Over the next 90 days, patient will maintain control of diabetes as evidenced by A1c  through collaboration with PharmD and provider.   Interventions: . 1:1 collaboration with [name], [name] regarding development and update of comprehensive plan of care as evidenced by provider attestation and co-signature . Inter-disciplinary care team collaboration (see longitudinal plan of care) . Comprehensive medication review performed; medication list updated in electronic medical record  Diabetes: . Uncontrolled, but more relaxed goal of A1c <7.5% with no low blood sugars is likely appropriate given age,  comorbidities; current treatment: Ozempic 1 mg weekly, glipizide 10 mg BID o Has been unable to tolerate metformin, IR and ER, d/t diarrhea.  o Did not tolerate Jardiance d/t tongue itching/sores, avoid other SGLT2 for risk of allergic reaction . Previously recommended to continue current regimen at this time. Continue to monitor for hypoglycemia . Due for DM foot exam with next visit   Hypertension, Ventricular Ectopy, s/p ICD placement . Fluctuant; current treatment: carvedilol 6.25 mg BID, spironolactone 25 mg QAM when he decreased carvedilol, lisinopril 5 10 mg QAM when he decreased carvedilol; . Antiarrhythmic - started amiodarone yesterday. Script for amiodarone 400 mg BID x 5 then 400 mg daily x 5 then 200 mg daily  . Given script for midodrine 5 mg by Dr. Rockey Situ to take PRN SBP <110 . S/p ICD placement: Primary cardiologist Dr. Rockey Situ; EP Dr. Quentin Ore.  . Patient calls today to discuss the medication. Reviewed patient concerns and Dr. Mardene Speak MyChart message back to patient. Reviewed long term concerns with amiodarone therapy. Encouraged to continue to communicate with providers about concerns.  . Reports recent readings: yesterday morning 115/69, pulse 69; 104/66 pulse 69; midmorning 117/61, late night was 117/69; This morning 118/66 . Reviewed to communicate with cardiology team prior to self-adjusting medications. Discussed importance of maintaining carvedilol at BID dosing.  . Patient concerned about accuracy of ICD, as he has not had any symptoms with recent events, and his apple watch has not reported any high HR at the times of the events. Reviewed that there is expected to be different degrees of accuracy between medical devices and apple watch. Encouraged to discuss ICD settings w/ Dr. Quentin Ore.   Hyperlipidemia and ASCVD risk reduction: [name] Very well controlled on last lab work, but not checked on current regimen;  current treatment: rosuvastatin 10 mg daily, ezetimibe 10 mg daily    . Antiplatelet regimen: clopidogrel 75 mg daily  . Previously recommended to continue current regimen at this time. F/u lipid panel.   BPH: . Controlled per patient report; current regimen: tamsulosin 0.4 mg daily . Previously recommended to continue current regimen at this time  GERD: . Controlled per patient report; current regimen: pantoprazole 40 mg daily . Previously recommended to continue current regimen at this time  Chronic Pain; . Controlled per patient report; current regimen: tramadol 50 mg BID; no need for PRN acetaminophen lately . Patient reports he plans to try to wean down on tramadol. Discussed use of acetaminophen. Encouraged communication with PCP  Supplements: Probiotic, Multivitamin  Patient Goals/Self-Care Activities . Over the next 90 days, patient will:  - take medications as prescribed check glucose daily, document, and provide at future appointments check blood pressure daily, document, and provide at future appointments collaborate with provider on medication access solutions  Follow Up Plan: Telephone follow up appointment with care management team member scheduled for: ~ 10 weeks as previously scheduled      Medication Assistance: Ozempic obtained through [employer] medication assistance program.  Enrollment ends 12/13/20  Patient's preferred pharmacy is:  [id number] [address], [place] [address] Phone: [phone] Fax: [phone]   Follow Up:  Patient agrees to Care Plan and Follow-up.  Plan: Telephone follow up appointment with care management team member scheduled for:  ~ 10 weeks as previously scheduled   [name], PharmD, [place], CPP Clinical Pharmacist [place] [facility]   I have collaborated with the care management provider regarding care management and care coordination activities  outlined in this encounter and have reviewed this encounter including documentation in the note and care plan. I am certifying that I agree with the content of this note and encounter as supervising physician.   Margaret arnett np

## 2020-05-18 NOTE — Progress Notes (Signed)
Remote ICD transmission.   

## 2020-05-20 ENCOUNTER — Other Ambulatory Visit
Admission: RE | Admit: 2020-05-20 | Discharge: 2020-05-20 | Disposition: A | Discharge status: Home / Self Care | Payer: Medicare Other | Attending: Cardiology | Admitting: Cardiology

## 2020-05-20 ENCOUNTER — Other Ambulatory Visit: Payer: Self-pay

## 2020-05-20 DIAGNOSIS — I472 Ventricular tachycardia, unspecified: Secondary | ICD-10-CM

## 2020-05-20 DIAGNOSIS — I5022 Chronic systolic (congestive) heart failure: Secondary | ICD-10-CM | POA: Insufficient documentation

## 2020-05-20 LAB — COMPREHENSIVE METABOLIC PANEL
ALT: 36 U/L (ref 0–44)
AST: 40 U/L (ref 15–41)
Albumin: 4.1 g/dL (ref 3.5–5.0)
Alkaline Phosphatase: 52 U/L (ref 38–126)
Anion gap: 8 (ref 5–15)
BUN: 19 mg/dL (ref 8–23)
CO2: 25 mmol/L (ref 22–32)
Calcium: 9.2 mg/dL (ref 8.9–10.3)
Chloride: 104 mmol/L (ref 98–111)
Creatinine, Ser: 0.75 mg/dL (ref 0.61–1.24)
GFR, Estimated: >60 mL/min (ref >60)
Glucose, Bld: 155 mg/dL — ABNORMAL HIGH (ref 70–99)
Potassium: 4.1 mmol/L (ref 3.5–5.1)
Sodium: 137 mmol/L (ref 135–145)
Total Bilirubin: 1.7 mg/dL — ABNORMAL HIGH (ref 0.3–1.2)
Total Protein: 7.3 g/dL (ref 6.5–8.1)

## 2020-05-20 LAB — MAGNESIUM: Magnesium: 1.4 mg/dL — ABNORMAL LOW (ref 1.7–2.4)

## 2020-05-22 ENCOUNTER — Telehealth: Payer: Self-pay

## 2020-05-22 NOTE — Telephone Encounter (Signed)
-----   Message from Vickie Epley, MD sent at 05/21/2020 11:23 AM EDT ----- Labs reviewed. Your potassium is normal. Your magnesium is still a little low. I recommend you start a magnesium supplement. There are several available but I typically recommend Slow-Mag. This is over the counter at [facility]. The normal dose is 2 tablets once a day.

## 2020-05-23 MED ORDER — MAGNESIUM 250 MG PO TABS: 2.0000 | ORAL_TABLET | Freq: Every day | ORAL | 3 refills | Status: DC

## 2020-06-07 ENCOUNTER — Other Ambulatory Visit: Payer: Self-pay

## 2020-06-07 ENCOUNTER — Telehealth (INDEPENDENT_AMBULATORY_CARE_PROVIDER_SITE_OTHER): Payer: Medicare Other | Admitting: Cardiology

## 2020-06-07 DIAGNOSIS — I25118 Atherosclerotic heart disease of native coronary artery with other forms of angina pectoris: Secondary | ICD-10-CM

## 2020-06-07 DIAGNOSIS — I472 Ventricular tachycardia, unspecified: Secondary | ICD-10-CM

## 2020-06-07 DIAGNOSIS — I5022 Chronic systolic (congestive) heart failure: Secondary | ICD-10-CM | POA: Diagnosis not present

## 2020-06-07 DIAGNOSIS — Z95810 Presence of automatic (implantable) cardiac defibrillator: Secondary | ICD-10-CM | POA: Diagnosis not present

## 2020-06-07 DIAGNOSIS — Z79899 Other long term (current) drug therapy: Secondary | ICD-10-CM | POA: Diagnosis not present

## 2020-06-07 NOTE — Progress Notes (Signed)
Virtual Visit via Telephone Note   This visit type was conducted due to national recommendations for restrictions regarding the COVID-19 Pandemic (e.g. social distancing) in an effort to limit this patient's exposure and mitigate transmission in our community.  Due to his co-morbid illnesses, this patient is at least at moderate risk for complications without adequate follow up.  This format is felt to be most appropriate for this patient at this time.  The patient did not have access to video technology/had technical difficulties with video requiring transitioning to audio format only (telephone).  All issues noted in this document were discussed and addressed.  No physical exam could be performed with this format.  Please refer to the patient's chart for his  consent to telehealth for [facility].    Date:  06/07/2020   ID:  [name] [name], DOB [birth year], MRN [id number] The patient was identified using 2 identifiers.  Patient Location: Home Provider Location: Office/Clinic   PCP:  [name], [place] Providers Cardiologist:  Ida Rogue, MD Electrophysiologist:  Vickie Epley, MD     Evaluation Performed:  Follow-Up Visit  Chief Complaint: Ventricular tachycardia  History of Present Illness:    [name] [name] is a 80 y.o. male with ventricular tachycardia, coronary artery disease, diabetes, hypertension, hyperlipidemia presents via telemedicine for follow-up.  I last saw the patient's May 03, 2020.  He has started amiodarone and we are working on improving his electrolyte derangements.  He tells me he has been doing well.  No ICD therapies delivered that he has been aware of.  He keeps a close eye on his blood pressures and heart rates.  His blood pressures have been in the 120s over 70s on average.  His heart rates vary anywhere from 48 to 108 bpm depending on what he is doing.  He continues to eat a half a banana a day in hopes of maintaining a  normal potassium level.  He has started his magnesium supplementation.  He has been reading about his medication list and is noticed that Protonix has a high incidence of hypomagnesia.  He is never had severe reflux symptoms so was interested in stopping this medication.   Past Medical History:  Diagnosis Date  . Aortic insufficiency    a. noted on TTE 2015; b. 06/2019 Echo: AI not visualized.  . Arthritis   . CAD (coronary artery disease)    a. remote PCI in 1991 and 2005; b. MV 3/15: old inferior MI, no ischemia, LVEF 50%, slight inferior wall hypokniesis; c. 06/2019 NSTEMI/PCI: LM nl, LAD 80p/m (Atherectomy & 4.5x18 Resolute Onyx DES), 66m/d, D1 75 (PTCA), RI patent stent, LCX nl, RCA 100p, RPAV fills via L->R collats from LCX.  [name] Chicken pox   . Colon polyps    4 pre-cancerous   . Diverticulitis   . DM type 2 (diabetes mellitus, type 2) ([place])   . Family history of adverse reaction to anesthesia   . GERD (gastroesophageal reflux disease)   . Heart murmur   . History of kidney stones   . HOH (hard of hearing)   . Hyperlipidemia   . Hypertension   . Ischemic cardiomyopathy    a. TTE 2015: EF  50-55%, mild global HK; b. 06/2019 Echo: EF 45-50%, Gr1 DD, basal inf AK. Triv MR.  . Kidney stones   . Melanoma ([place]) 1980   Resected from his back  . Mitral regurgitation    a. noted on TTE 2015  .  Myocardial infarction [facility])    Past Surgical History:  Procedure Laterality Date  . CHOLECYSTECTOMY  2012  . COLONOSCOPY     in 2003 with polyp removed and leak anastomosis had to have open abdominal surgery   . CORONARY ANGIOPLASTY WITH STENT PLACEMENT  1991 & 2005  . CORONARY ATHERECTOMY N/A 07/08/2019   Procedure: CORONARY ATHERECTOMY;  Surgeon: Jettie Booze, MD;  Location: [place] [facility];  Service: Cardiovascular;  Laterality: N/A;  . CORONARY BALLOON ANGIOPLASTY N/A 07/08/2019   Procedure: CORONARY BALLOON ANGIOPLASTY;  Surgeon: Jettie Booze, MD;  Location: [place] [facility];  Service: Cardiovascular;  Laterality: N/A;  diagonal   . CORONARY STENT INTERVENTION N/A 07/08/2019   Procedure: CORONARY STENT INTERVENTION;  Surgeon: Jettie Booze, MD;  Location: [place] [facility];  Service: Cardiovascular;  Laterality: N/A;  lad  . CYSTOSCOPY/URETEROSCOPY/HOLMIUM LASER/STENT PLACEMENT Right 01/02/2018   Procedure: CYSTOSCOPY/URETEROSCOPY/HOLMIUM LASER/STENT PLACEMENT;  Surgeon: [name], MD;  Location: [facility] ORS;  Service: Urology;  Laterality: Right;  . ESOPHAGOGASTRODUODENOSCOPY (EGD) WITH PROPOFOL N/A 11/19/2016   Procedure: ESOPHAGOGASTRODUODENOSCOPY (EGD) WITH PROPOFOL;  Surgeon: [name], MD;  Location: [facility] ENDOSCOPY;  Service: Endoscopy;  Laterality: N/A;  . ESOPHAGOGASTRODUODENOSCOPY (EGD) WITH PROPOFOL N/A 02/02/2019   Procedure: ESOPHAGOGASTRODUODENOSCOPY (EGD) WITH PROPOFOL;  Surgeon: [name], MD;  Location: [facility] ENDOSCOPY;  Service: Endoscopy;  Laterality: N/A;  . ICD IMPLANT N/A 02/02/2020   Procedure: ICD IMPLANT;  Surgeon: Vickie Epley, MD;  Location: [place] [facility];  Service: Cardiovascular;  Laterality: N/A;  . INTRAVASCULAR PRESSURE WIRE/FFR STUDY N/A 07/07/2019   Procedure: INTRAVASCULAR PRESSURE WIRE/FFR STUDY;  Surgeon: Nelva Bush, MD;  Location: [place] [facility];  Service: Cardiovascular;  Laterality: N/A;  . INTRAVASCULAR ULTRASOUND/IVUS N/A 07/08/2019   Procedure: Intravascular Ultrasound/IVUS;  Surgeon: Jettie Booze, MD;  Location: [place] [facility];  Service: Cardiovascular;  Laterality: N/A;  . LEAD REVISION/REPAIR N/A 02/16/2020   Procedure: LEAD REVISION/REPAIR;  Surgeon: Constance Haw, MD;  Location: [place] [facility];  Service: Cardiovascular;  Laterality: N/A;  . LEFT HEART CATH N/A 07/08/2019   Procedure: Left Heart Cath;  Surgeon: Jettie Booze, MD;  Location: [place] [facility];  Service: Cardiovascular;  Laterality: N/A;  . LEFT HEART CATH AND CORONARY ANGIOGRAPHY N/A  07/07/2019   Procedure: LEFT HEART CATH AND CORONARY ANGIOGRAPHY;  Surgeon: Nelva Bush, MD;  Location: [place] [facility];  Service: Cardiovascular;  Laterality: N/A;  . LITHOTRIPSY  2015  . MELANOMA EXCISION  1980   malignant  . NERVE SURGERY  2015   ulna nerve  . TONSILLECTOMY  1945  . WISDOM TOOTH EXTRACTION       No outpatient medications have been marked as taking for the 06/07/20 encounter (Telemedicine) with Vickie Epley, MD.     Allergies:   Metformin and related, Azithromycin, Other, Percocet [oxycodone-acetaminophen], and Jardiance [empagliflozin]   Social History   Tobacco Use  . Smoking status: Current Some Day Smoker    Types: Pipe  . Smokeless tobacco: Former Systems developer    Quit date: 10/17/2015  Vaping Use  . Vaping Use: Never used  Substance Use Topics  . Alcohol use: Not Currently    Alcohol/week: 0.0 - 1.0 standard drinks  . Drug use: No     Family Hx: The patient's family history includes Colon cancer in his father; Diabetes in his father and mother; Heart attack in his mother; Heart disease in his mother; Hyperlipidemia in his  mother; Hypertension in his mother; Kidney cancer in his father; Liver cancer in his father; Lung cancer in his father. There is no history of Bladder Cancer or Prostate cancer.  ROS:   Please see the history of present illness.     All other systems reviewed and are negative.    Labs/Other Tests and Data Reviewed:    EKG:  No ECG reviewed.  Recent Labs: 07/07/2019: B Natriuretic Peptide 111.1 03/28/2020: TSH 1.36 04/19/2020: Hemoglobin 14.6; Platelets 163 05/20/2020: ALT 36; BUN 19; Creatinine, Ser 0.75; Magnesium 1.4; Potassium 4.1; Sodium 137   Recent Lipid Panel Lab Results  Component Value Date/Time   CHOL 124 12/21/2019 09:19 AM   TRIG 267 (H) 12/21/2019 09:19 AM   HDL 36 (L) 12/21/2019 09:19 AM   CHOLHDL 3.4 12/21/2019 09:19 AM   LDLCALC 35 12/21/2019 09:19 AM   LDLDIRECT 55.0 02/10/2019 09:03 AM    Wt  Readings from Last 3 Encounters:  05/03/20 204 lb (92.5 kg)  04/21/20 204 lb 4 oz (92.6 kg)  04/19/20 194 lb (88 kg)     Objective:    Vital Signs:  There were no vitals taken for this visit.    ASSESSMENT & PLAN:    1. Chronic systolic heart failure NYHA class II.  Warm and dry by symptom review. Continue Coreg, lisinopril, spironolactone   2.  Ventricular tachycardia post ICD ICD functioning well. No delivered therapies since May 4 while taking amiodarone.  This was confirmed with the device clinic in [place].  His last transmission was Jun 06, 2020. Continue taking amiodarone 200 mg by mouth daily. Continue magnesium supplementation. Stop Protonix in an effort to improve magnesium levels. We will plan to check CMP, mag, TSH and free T4 at the July 20 follow-up appointment   Time:   Today, I have spent 15 minutes with the patient with telehealth technology discussing the above problems.     Medication Adjustments/Labs and Tests Ordered: Current medicines are reviewed at length with the patient today.  Concerns regarding medicines are outlined above.   Tests Ordered: No orders of the defined types were placed in this encounter.   Medication Changes: No orders of the defined types were placed in this encounter.   Follow Up:  In Person in July  Signed, Vickie Epley, MD  06/07/2020 10:07 AM    [place]

## 2020-06-07 NOTE — Patient Instructions (Addendum)
Medication Instructions:  Your physician has recommended you make the following change in your medication:  1.  STOP protonix  *If you need a refill on your cardiac medications before your next appointment, please call your pharmacy*  Lab Work: None ordered. If you have labs (blood work) drawn today and your tests are completely normal, you will receive your results only by: [name] MyChart Message (if you have MyChart) OR . A paper copy in the mail If you have any lab test that is abnormal or we need to change your treatment, we will call you to review the results.  Testing/Procedures: None ordered.  Follow-Up: At [facility], you and your health needs are our priority.  As part of our continuing mission to provide you with exceptional heart care, we have created designated Provider Care Teams.  These Care Teams include your primary Cardiologist (physician) and Advanced Practice Providers (APPs -  Physician Assistants and Nurse Practitioners) who all work together to provide you with the care you need, when you need it.  Your next appointment:   Your physician wants you to follow-up as scheduled  Remote monitoring is used to monitor your ICD from home. This monitoring reduces the number of office visits required to check your device to one time per year. It allows [place] to keep an eye on the functioning of your device to ensure it is working properly. You are scheduled for a device check from home on 08/01/2020. You may send your transmission at any time that day. If you have a wireless device, the transmission will be sent automatically. After your physician reviews your transmission, you will receive a postcard with your next transmission date.

## 2020-06-13 ENCOUNTER — Telehealth: Payer: Medicare Other | Admitting: Family Medicine

## 2020-06-16 ENCOUNTER — Other Ambulatory Visit: Payer: Self-pay | Admitting: Family

## 2020-07-04 ENCOUNTER — Telehealth: Payer: Self-pay | Admitting: Family

## 2020-07-04 NOTE — Telephone Encounter (Signed)
FYI Pt stated that he is not worried about the sinus infection. He said that he has had some xrays done & had some issues with is neck in the past. He does not feel that is necessarily related medications. He thinks everything is separate & has been going for a few weeks  now. He said that he notices after evening meds with include glipizide & statin he has gastrointestinal issues, some diarrhea with belching. He is going to try leaving off statin to see if stomach feels better. He is scheduled for VV on Monday. Advised if any worsening symptoms or for potential sinus infection that he go to UC due to no appointments in office.

## 2020-07-04 NOTE — Telephone Encounter (Signed)
PT called into the office to advise that the he is having a chronic sinus infection, stiff neck and he thinks he might be having a reaction to one of his meds. Advise him that due to him having a sinus infection it would have to be virtual. PT asked if I could check to see if it can be in office and advise would send a msg back as he would prefer to have in office instead.

## 2020-07-05 ENCOUNTER — Ambulatory Visit (INDEPENDENT_AMBULATORY_CARE_PROVIDER_SITE_OTHER): Payer: Medicare Other | Admitting: Pharmacist

## 2020-07-05 DIAGNOSIS — I10 Essential (primary) hypertension: Secondary | ICD-10-CM | POA: Diagnosis not present

## 2020-07-05 DIAGNOSIS — I25118 Atherosclerotic heart disease of native coronary artery with other forms of angina pectoris: Secondary | ICD-10-CM | POA: Diagnosis not present

## 2020-07-05 DIAGNOSIS — E782 Mixed hyperlipidemia: Secondary | ICD-10-CM | POA: Diagnosis not present

## 2020-07-05 DIAGNOSIS — E1165 Type 2 diabetes mellitus with hyperglycemia: Secondary | ICD-10-CM | POA: Diagnosis not present

## 2020-07-05 DIAGNOSIS — I255 Ischemic cardiomyopathy: Secondary | ICD-10-CM | POA: Diagnosis not present

## 2020-07-05 DIAGNOSIS — N400 Enlarged prostate without lower urinary tract symptoms: Secondary | ICD-10-CM

## 2020-07-05 NOTE — Patient Instructions (Signed)
Visit Information  PATIENT GOALS:  Goals Addressed               This Visit's Progress     Patient Stated     Medication Monitoring (pt-stated)        Patient Goals/Self-Care Activities Over the next 90 days, patient will:  - take medications as prescribed check glucose daily, document, and provide at future appointments check blood pressure daily, document, and provide at future appointments collaborate with provider on medication access solutions         Patient verbalizes understanding of instructions provided today and agrees to view in [place].   Plan: Telephone follow up appointment with care management team member scheduled for:  ~ 6 weeks as previously scheduled  [name], PharmD, [place], [place] Clinical Pharmacist [employer] [facility] [employer] [phone]

## 2020-07-05 NOTE — Chronic Care Management (AMB) (Signed)
Chronic Care Management Pharmacy Note  07/05/2020 Name:  Garmon Dehn MRN:  [id number] DOB:  1940-12-21   Subjective: [name] [name] is an 80 y.o. year old male who is a primary patient of Vidal Schwalbe, [name], [name].  The CCM team was consulted for assistance with disease management and care coordination needs.    Engaged with patient by telephone for  response to call with medication questions  in response to provider referral for pharmacy case management and/or care coordination services.   Consent to Services:  The patient was given information about Chronic Care Management services, agreed to services, and gave verbal consent prior to initiation of services.  Please see initial visit note for detailed documentation.   Patient Care Team: [name], [name] as PCP - General (Family Medicine) [name], MD as PCP - Cardiology (Cardiology) Vickie Epley, MD as PCP - Electrophysiology (Cardiology) De Hollingshead, RPH-CPP as Pharmacist (Pharmacist)    Objective:  Lab Results  Component Value Date   CREATININE 0.75 05/20/2020   CREATININE 0.88 04/19/2020   CREATININE 0.83 03/28/2020    Lab Results  Component Value Date   HGBA1C 7.3 (H) 03/28/2020   Last diabetic Eye exam:  Lab Results  Component Value Date/Time   HMDIABEYEEXA No Retinopathy 09/10/2019 12:00 AM    Last diabetic Foot exam: No results found for: HMDIABFOOTEX      Component Value Date/Time   CHOL 124 12/21/2019 0919   TRIG 267 (H) 12/21/2019 0919   HDL 36 (L) 12/21/2019 0919   CHOLHDL 3.4 12/21/2019 0919   VLDL 53 (H) 12/21/2019 0919   LDLCALC 35 12/21/2019 0919   LDLDIRECT 55.0 02/10/2019 0903    Hepatic Function Latest Ref Rng & Units 05/20/2020 03/28/2020 12/21/2019  Total Protein 6.5 - 8.1 g/dL 7.3 6.6 7.4  Albumin 3.5 - 5.0 g/dL 4.1 4.0 4.1  AST 15 - 41 U/L 40 33 33  ALT 0 - 44 U/L 36 38 36  Alk Phosphatase 38 - 126 U/L 52 52 53  Total Bilirubin 0.3 - 1.2 mg/dL  1.7(H) 1.2 1.7(H)  Bilirubin, Direct 0.0 - 0.2 mg/dL - - 0.2    Lab Results  Component Value Date/Time   TSH 1.36 03/28/2020 11:36 AM   TSH 1.006 01/08/2017 11:15 AM   TSH 0.76 12/19/2014 04:39 PM   FREET4 1.05 01/08/2017 11:15 AM    CBC Latest Ref Rng & Units 04/19/2020 03/28/2020 02/16/2020  WBC 4.0 - 10.5 K/uL 7.6 7.8 7.4  Hemoglobin 13.0 - 17.0 g/dL 14.6 14.4 14.7  Hematocrit 39.0 - 52.0 % 41.2 40.9 42.1  Platelets 150 - 400 K/uL 163 148.0(L) 158    Lab Results  Component Value Date/Time   VD25OH 33.0 01/08/2017 11:15 AM    Clinical ASCVD: Yes  The ASCVD Risk score Mikey Bussing DC Jr., et al., 2013) failed to calculate for the following reasons:   The 2013 ASCVD risk score is only valid for ages 37 to 11   The patient has a prior MI or stroke diagnosis     Social History   Tobacco Use  Smoking Status Some Days   Pack years: 0.00   Types: Pipe  Smokeless Tobacco Former   Quit date: 10/17/2015   BP Readings from Last 3 Encounters:  05/03/20 101/64  04/21/20 (!) 108/58  04/20/20 129/79   Pulse Readings from Last 3 Encounters:  05/03/20 63  04/21/20 75  04/20/20 64   Wt Readings from Last 3 Encounters:  05/03/20  204 lb (92.5 kg)  04/21/20 204 lb 4 oz (92.6 kg)  04/19/20 194 lb (88 kg)    Assessment: Review of patient past medical history, allergies, medications, health status, including review of consultants reports, laboratory and other test data, was performed as part of comprehensive evaluation and provision of chronic care management services.   SDOH:  (Social Determinants of Health) assessments and interventions performed:    CCM Care Plan  Allergies  Allergen Reactions   Metformin And Related Diarrhea and Other (See Comments)    Leg cramps, also   Azithromycin Other (See Comments)    Not recommended - no reaction   Other Other (See Comments)    If taking antibiotics for awhile, thrush results   Percocet [Oxycodone-Acetaminophen] Other (See Comments)     Hallucinations   Jardiance [Empagliflozin] Other (See Comments)    Tongue itching/reaction    Medications Reviewed Today     Reviewed by De Hollingshead, RPH-CPP (Pharmacist) on 07/05/20 at [place] List Status: <None>   Medication Order Taking? Sig Documenting Provider Last Dose Status Informant  acetaminophen (TYLENOL) 500 MG tablet [id number]  Take 1,000 mg by mouth every 6 (six) hours as needed (pain.). [provider]  Active   amiodarone (PACERONE) 200 MG tablet [id number] Yes Take 2 tablets (400 mg total) by mouth 2 (two) times daily for 5 days, THEN 2 tablets (400 mg total) daily for 5 days, THEN 1 tablet (200 mg total) daily. Vickie Epley, MD Taking Active   aspirin EC 81 MG tablet [id number]  Take 1 tablet (81 mg total) by mouth daily. Swallow whole. [name], MD  Active   carvedilol (COREG) 6.25 MG tablet [id number] Yes Take 6.25 mg by mouth daily. [provider] Taking Active   clopidogrel (PLAVIX) 75 MG tablet [id number] Yes TAKE 1 TABLET(75 MG) BY MOUTH DAILY WITH BREAKFAST [name], [name], MD Taking Active   ezetimibe (ZETIA) 10 MG tablet [id number] Yes TAKE 1 TABLET(10 MG) BY MOUTH DAILY [name], [name], MD Taking Active   fluticasone (FLONASE) 50 MCG/ACT nasal spray [id number]  Place into both nostrils daily as needed for allergies or rhinitis. [provider]  Active   glipiZIDE (GLUCOTROL) 10 MG tablet [id number] Yes Take 1 tablet (10 mg total) by mouth 2 (two) times daily before a meal. Take with largest meals. [name], [name] Taking Active   lisinopril (ZESTRIL) 10 MG tablet [id number] Yes Take 10 mg by mouth daily. [provider] Taking Active   Magnesium 250 MG TABS [id number] No Take 2 tablets (500 mg total) by mouth daily.  Patient not taking: Reported on 07/05/2020   Vickie Epley, MD Not Taking Active   midodrine (PROAMATINE) 5 MG tablet [id number]  Take 1 tablet (5 mg total) by mouth 2 (two) times daily  as needed. Take when BP does drop less than 110 when standing. Additional dose at 2 pm if still dropping below 110 when standing [name], [name], MD  Active   Multiple Vitamin (MULTIVITAMIN WITH MINERALS) TABS tablet [id number] Yes Take 1 tablet by mouth daily with lunch. [provider] Taking Active Self  nitroGLYCERIN (NITROSTAT) 0.4 MG SL tablet [id number]  Place 1 tablet (0.4 mg total) under the tongue every 5 (five) minutes x 3 doses as needed for chest pain. Tommie Raymond, NP  Active   [facility] VERIO test strip [id number]  USE TO CHECK BLOOD SUGAR TWICE DAILY Vidal Schwalbe [name], [name]  Active  pantoprazole (PROTONIX) 40 MG tablet [id number] Yes Take 40 mg by mouth daily. [provider] Taking Active   Polyvinyl Alcohol-Povidone (MURINE TEARS FOR DRY EYES OP) [id number] Yes Place 1-2 drops into both eyes as needed (for dry eyes).  [provider] Taking Active Self  Probiotic Product (PROBIOTIC PO) [id number] Yes Take 1 capsule by mouth every evening. [provider] Taking Active Self  psyllium (METAMUCIL) 58.6 % powder [id number]  Take 1 packet by mouth at bedtime. [provider]  Active Self           Med Note Duffy [name], [name]   Wed Jul 07, 2019  6:13 PM)    rosuvastatin (CRESTOR) 10 MG tablet [id number] Yes Take 10 mg by mouth daily. [provider] Taking Active   Semaglutide, 1 MG/DOSE, (OZEMPIC, 1 MG/DOSE,) 4 MG/3ML SOPN [id number] Yes ADMINISTER 1 MG UNDER THE SKIN 1 TIME A WEEK [name], [name], [name] Taking Active   sodium chloride (OCEAN) 0.65 % SOLN nasal spray [id number]  Place 1 spray into both nostrils as needed for congestion. [provider]  Active Self  spironolactone (ALDACTONE) 25 MG tablet [id number] Yes Take 25 mg by mouth daily. [provider] Taking Active   tamsulosin (FLOMAX) 0.4 MG CAPS capsule [id number] Yes TAKE 1 CAPSULE(0.4 MG) BY MOUTH DAILY [name], MD Taking Active   traMADol (ULTRAM) 50  MG tablet [id number] Yes TAKE 1 TABLET BY MOUTH EVERY 12 HOURS AS NEEDED [name], [name] Taking Active             Patient Active Problem List   Diagnosis Date Noted   Fatigue 03/31/2020   Malfunction of implantable defibrillator ventricular (ICD) lead 02/15/2020   Cardiomyopathy ([place])    Chronic systolic heart failure ([place]) 02/02/2020   Mural thrombus of left ventricle 02/02/2020   ICD (implantable cardioverter-defibrillator) in place 02/02/2020   Ischemic cardiomyopathy    VT (ventricular tachycardia) ([place]) 07/09/2019   NSTEMI (non-ST elevated myocardial infarction) ([place]) 07/07/2019   De Quervain's tenosynovitis, left 05/17/2019   Esophagitis, unspecified without bleeding    Esophageal ulcer 11/20/2018   Acute left flank pain 10/23/2018   Post-nasal drip 10/23/2018   Allergic rhinitis 10/23/2018   Right shoulder pain 07/03/2018   Skin lesion 06/12/2018   Pruritus 06/12/2018   Kidney stones, calcium oxalate 05/28/2018   Atherosclerosis of native coronary artery with stable angina pectoris ([place]) 05/17/2018   GERD (gastroesophageal reflux disease) 02/23/2018   SCC (squamous cell carcinoma) 05/15/2017   Cervical stenosis of spine 05/15/2017   Tobacco abuse 01/26/2017   Cardiac murmur 01/08/2017   Right renal mass 10/31/2016   Abdominal pain 10/31/2016   Fatty liver 10/31/2016   Hematuria 10/31/2016   Acute non-recurrent maxillary sinusitis 10/17/2015   Carotid artery disease ([place]) 10/03/2015   Atherosclerosis of abdominal aorta ([place]) 07/28/2015   CAD (coronary artery disease) 07/10/2015   Essential hypertension 07/10/2015   Mixed hyperlipidemia 07/10/2015   Controlled type 2 diabetes mellitus with complication, without long-term current use of insulin ([place]) 07/10/2015   History of melanoma 07/10/2015   Chronic low back pain 07/10/2015   BPH (benign prostatic hyperplasia) 07/10/2015   Bilateral hand numbness 07/10/2015   Insomnia 07/10/2015    Immunization  History  Administered Date(s) Administered   Influenza, High Dose Seasonal PF 11/01/2015, 10/20/2017, 09/28/2019   Influenza,inj,Quad PF,6+ Mos 10/08/2016   Influenza-Unspecified 10/01/2018   PFIZER(Purple Top)SARS-COV-2 Vaccination 02/04/2019, 02/25/2019, 10/12/2019    Conditions to be addressed/monitored: CAD, HTN, HLD,  and DMII  Care Plan : Medication Management  Updates made by De Hollingshead, RPH-CPP since 07/05/2020 12:00 AM     Problem: Diabetes, CAD, HTN, HLD      Long-Range Goal: Disease Progression Prevention   This Visit's Progress: On track  Recent Progress: On track  Priority: High  Note:   Current Barriers:  Unable to independently afford treatment regimen Unable to achieve control of diabetes   Pharmacist Clinical Goal(s):  Over the next 90 days, patient will verbalize ability to afford treatment regimen Over the next 90 days, patient will maintain control of diabetes as evidenced by A1c  through collaboration with PharmD and provider.   Interventions: 1:1 collaboration with [name], [name] regarding development and update of comprehensive plan of care as evidenced by provider attestation and co-signature Inter-disciplinary care team collaboration (see longitudinal plan of care) Comprehensive medication review performed; medication list updated in electronic medical record  Acute Needs: Reports "sinus infection" about 3 weeks ago. Reports continued sinus drainage and sore throat. Using saline nasal spray, gargling with salt water. Sore throat has subsided. Taking Coricidin ~ one tab ~Q8H. F/u with PCP on Monday (virtual) as scheduled Reports "neck pain" over the past few days. Taking acetaminophen. Discussed that I do not expect this is related to any medications and that he should discuss with PCP on Monday.  Reports a history of gas and bloating over the past few weeks. Notes that he stopped rosuvastatin and glipizide to see if related, and he  wonders if amiodarone is related. He also notes that he stopped pantoprazole for several weeks due to what he read about the association with hypomagnesemia. Had a recurrence of acid reflux so restarted. Also notes that given sinus infection, he has been eating a more bland diet. Discussed that changes in GI medications and his diet is more likely to be related to his gas/bloating symptoms, given there has been no recent change in rosuvastatin, glipizide, or amiodarone. He will restart rosuvastatin and glipizide.    Health Maintenance: Due for foot exam with next visit.   Diabetes: Uncontrolled, but more relaxed goal of A1c <7.5% with no low blood sugars is likely appropriate given age, comorbidities; current treatment: Ozempic 1 mg weekly, glipizide 10 mg BID Has been unable to tolerate metformin, IR and ER, d/t diarrhea.  Did not tolerate Jardiance d/t tongue itching/sores, avoid other SGLT2 for risk of allergic reaction Previously recommended to continue current regimen at this time. Continue to monitor for hypoglycemia  Hypertension, Ventricular Ectopy, s/p ICD placement Controlled; current treatment: carvedilol 6.25 mg BID, spironolactone 25 mg QAM, lisinopril 10 mg QAM; follows w/ Dr. Rockey Situ Antiarrhythmic: amiodarone 200 mg daily; follows w/ Dr. Quentin Ore Given script for midodrine 5 mg by Dr. Rockey Situ to take PRN SBP <110 Continue collaboration with cardiology and EP as above. Recommended to continue current regimen.  Hyperlipidemia and ASCVD risk reduction: Very well controlled on last lab work, but not checked on current regimen; current treatment: rosuvastatin 10 mg daily, ezetimibe 10 mg daily   Antiplatelet regimen: clopidogrel 75 mg daily  Previously recommended to continue current regimen at this time along with cardiology collaboration  BPH: Controlled per patient report; current regimen: tamsulosin 0.4 mg daily Previously recommended to continue current regimen at this  time  GERD: Controlled per patient report; current regimen: pantoprazole 40 mg daily If continued hypoMag, consider holding pantoprazole with PRN famotidine.  Previously recommended to continue current regimen at this time  Chronic Pain; Controlled  per patient report; current regimen: tramadol 50 mg BID; PRN acetaminophen due to neck pain Continue current regimen at this time along with collaboration with PCP  Supplements: Probiotic, Multivitamin  Patient Goals/Self-Care Activities Over the next 90 days, patient will:  - take medications as prescribed check glucose daily, document, and provide at future appointments check blood pressure daily, document, and provide at future appointments collaborate with provider on medication access solutions  Follow Up Plan: Telephone follow up appointment with care management team member scheduled for: ~ 6 weeks as previously scheduled      Medication Assistance: Ozempic obtained through [employer] medication assistance program.  Enrollment ends 12/13/20  Patient's preferred pharmacy is:  [id number], [place] [address] Phone: [phone] Fax: [phone]   Follow Up:  Patient agrees to Care Plan and Follow-up.  Plan: Telephone follow up appointment with care management team member scheduled for:  ~ 6 weeks as previously scheduled  [name], PharmD, [place], [place] Clinical Pharmacist [employer] at [employer] [phone]

## 2020-07-07 NOTE — Telephone Encounter (Signed)
Noted  Appt sch for 07/10/20

## 2020-07-08 ENCOUNTER — Other Ambulatory Visit: Payer: Self-pay | Admitting: Family

## 2020-07-08 DIAGNOSIS — G8929 Other chronic pain: Secondary | ICD-10-CM

## 2020-07-10 ENCOUNTER — Telehealth: Payer: Self-pay | Admitting: Family

## 2020-07-10 ENCOUNTER — Telehealth (INDEPENDENT_AMBULATORY_CARE_PROVIDER_SITE_OTHER): Payer: Medicare Other | Admitting: Family

## 2020-07-10 ENCOUNTER — Encounter: Payer: Self-pay | Admitting: Family

## 2020-07-10 VITALS — Ht 72.0 in

## 2020-07-10 DIAGNOSIS — R1084 Generalized abdominal pain: Secondary | ICD-10-CM | POA: Diagnosis not present

## 2020-07-10 DIAGNOSIS — R197 Diarrhea, unspecified: Secondary | ICD-10-CM | POA: Diagnosis not present

## 2020-07-10 DIAGNOSIS — M542 Cervicalgia: Secondary | ICD-10-CM

## 2020-07-10 DIAGNOSIS — R17 Unspecified jaundice: Secondary | ICD-10-CM | POA: Insufficient documentation

## 2020-07-10 NOTE — Patient Instructions (Addendum)
Hold Ozempic, metamucil.  Continue pepcid ac over the counter , take 1 hour prior to supper.   Let me know about MRI Cervical spine after speaking with Dr Quentin Ore  Return stool to [facility] medical mall CT abdomen and pelvis  Let us know if you dont hear back within a week in regards to an appointment being scheduled.

## 2020-07-10 NOTE — Progress Notes (Signed)
Virtual Visit via Video Note  I connected with@  on 07/11/20 at  3:30 PM EDT by a video enabled telemedicine application and verified that I am speaking with the correct person using two identifiers.  Location patient: home Location provider:work  Persons participating in the virtual visit: patient, provider  I discussed the limitations of evaluation and management by telemedicine and the availability of in person appointments. The patient expressed understanding and agreed to proceed.   HPI: Acute complaint Complains of rumbling in stomach, belching , x 2 weeks.  Starts couple hours after dinner. Occasional watery brown stool which doesn't occur every day.  No blood in stool.  Symptoms subsides generally on their own by morning the next day.  No fever, abdominal pain.  No unusual foods, recent antibiotics. He was worried probiotic was causing as he takes in the evening. He held crestor at night without improvement and has since resumed. Tried mylanta twice without relief. He tried pepsid yesterday. He is compliant with metamucil.  He is longer on magnesium , stopped 2 weeks ago.   He is compliant with pantoprazole 40mg  prior to breakfast.  Negative home covid test.   h/o diverticulosis.  H/o cholecystectomy Last colonoscopy 2003  Complains of neck stiffness which comes and goes. Describes as flares which are unprovoked. Self limiting. No neck pain today.  Will trial aleve without relief.  No numbness in arms, HA, vision changes.   Xr cervical in 2017. Mild multilevel degenerative disc disease of the mid cervical spine.  A1c 7.3. Compliant with ozempic 1mg   He has ICD, pacemaker.    ROS: See pertinent positives and negatives per HPI.    EXAM:  VITALS per patient if applicable: Ht 6' (3.235 m)   BMI 27.67 kg/m  BP Readings from Last 3 Encounters:  05/03/20 101/64  04/21/20 (!) 108/58  04/20/20 129/79   Wt Readings from Last 3 Encounters:  05/03/20 204 lb (92.5  kg)  04/21/20 204 lb 4 oz (92.6 kg)  04/19/20 194 lb (88 kg)    GENERAL: alert, oriented, appears well and in no acute distress  HEENT: atraumatic, conjunttiva clear, no obvious abnormalities on inspection of external nose and ears  NECK: normal movements of the head and neck  LUNGS: on inspection no signs of respiratory distress, breathing rate appears normal, no obvious gross SOB, gasping or wheezing  CV: no obvious cyanosis  MS: moves all visible extremities including neck without noticeable abnormality. He can turn neck to right and left.   PSYCH/NEURO: pleasant and cooperative, no obvious depression or anxiety, speech and thought processing grossly intact  ASSESSMENT AND PLAN:  Discussed the following assessment and plan:  Problem List Items Addressed This Visit       Other   Abdominal pain    Afebrile. Differentials include: GERD, diverticulitis, viral gastroenteritis, diarrhea after cholecystectomy. Pending stool culture, CT ab/p. Start pepcid ac 1 hour prior to supper. Continue pantoprozole 40mg  qd. Hold metamucil in setting of loose stools and magnesium. May resume metamucil to bulk stools or prn imodium however at follow up. Will plan to discuss artificial sweeteners, caffeine, lactose and gluten at follow up if no resolution of symptoms. Consider GI consult, colonoscopy as well.        Relevant Orders   CT ABDOMEN PELVIS W CONTRAST   Neck pain    Chronic. Not bothering him today. Discussed suspected DDD.  He will confirm with dr Quentin Ore regarding if he can safely have MRI c spine. If safe  to have MRI with ICD, pacemaker,I order c spine and place referral to Dr Sharlet Salina for consideration of epidural. Consider cymbalta at follow up.       Other Visit Diagnoses     Diarrhea, unspecified type    -  Primary   Relevant Orders   CT ABDOMEN PELVIS W CONTRAST   GI pathogen panel by PCR, stool       -we discussed possible serious and likely etiologies, options for  evaluation and workup, limitations of telemedicine visit vs in person visit, treatment, treatment risks and precautions. Pt prefers to treat via telemedicine empirically rather then risking or undertaking an in person visit at this moment.  .   I discussed the assessment and treatment plan with the patient. The patient was provided an opportunity to ask questions and all were answered. The patient agreed with the plan and demonstrated an understanding of the instructions.   The patient was advised to call back or seek an in-person evaluation if the symptoms worsen or if the condition fails to improve as anticipated.   [name], [name]

## 2020-07-10 NOTE — Telephone Encounter (Signed)
Refilled: 05/08/2020 Last OV: 03/28/2020 Next OV: 07/10/2020

## 2020-07-10 NOTE — Assessment & Plan Note (Addendum)
Chronic. Not bothering him today. Discussed suspected DDD.  He will confirm with dr Quentin Ore regarding if he can safely have MRI c spine. If safe to have MRI with ICD, pacemaker,I order c spine and place referral to Dr Sharlet Salina for consideration of epidural. Consider cymbalta at follow up.

## 2020-07-11 ENCOUNTER — Encounter: Payer: Self-pay | Admitting: Family

## 2020-07-11 ENCOUNTER — Other Ambulatory Visit: Payer: Self-pay

## 2020-07-11 ENCOUNTER — Ambulatory Visit
Admission: RE | Admit: 2020-07-11 | Discharge: 2020-07-11 | Disposition: A | Discharge status: Home / Self Care | Payer: Medicare Other | Source: Ambulatory Visit | Attending: Family | Admitting: Family

## 2020-07-11 DIAGNOSIS — R197 Diarrhea, unspecified: Secondary | ICD-10-CM | POA: Diagnosis not present

## 2020-07-11 DIAGNOSIS — R1084 Generalized abdominal pain: Secondary | ICD-10-CM | POA: Insufficient documentation

## 2020-07-11 DIAGNOSIS — R109 Unspecified abdominal pain: Secondary | ICD-10-CM | POA: Diagnosis not present

## 2020-07-11 LAB — POCT I-STAT CREATININE: Creatinine, Ser: 0.8 mg/dL (ref 0.61–1.24)

## 2020-07-11 MED ORDER — IOHEXOL 300 MG/ML  SOLN
85.0000 mL | Freq: Once | INTRAMUSCULAR | Status: AC | PRN
  Administered 2020-07-11: 85 mL via INTRAVENOUS

## 2020-07-11 NOTE — Assessment & Plan Note (Addendum)
Afebrile. Differentials include: GERD, diverticulitis, viral gastroenteritis, diarrhea after cholecystectomy. Pending stool culture, CT ab/p. Start pepcid ac 1 hour prior to supper. Continue pantoprozole 40mg  qd. Hold metamucil in setting of loose stools and magnesium. May resume metamucil to bulk stools or prn imodium however at follow up. Will plan to discuss artificial sweeteners, caffeine, lactose and gluten at follow up if no resolution of symptoms. Consider GI consult, colonoscopy as well.

## 2020-07-14 ENCOUNTER — Other Ambulatory Visit: Payer: Self-pay | Admitting: Family

## 2020-07-14 ENCOUNTER — Other Ambulatory Visit
Admission: RE | Admit: 2020-07-14 | Discharge: 2020-07-14 | Disposition: A | Discharge status: Home / Self Care | Payer: Medicare Other | Source: Ambulatory Visit | Attending: Family | Admitting: Family

## 2020-07-14 DIAGNOSIS — R197 Diarrhea, unspecified: Secondary | ICD-10-CM

## 2020-07-14 DIAGNOSIS — R911 Solitary pulmonary nodule: Secondary | ICD-10-CM

## 2020-07-14 LAB — GASTROINTESTINAL PANEL BY PCR, STOOL (REPLACES STOOL CULTURE)

## 2020-07-18 ENCOUNTER — Encounter: Payer: Self-pay | Admitting: Family

## 2020-07-19 ENCOUNTER — Other Ambulatory Visit: Payer: Self-pay

## 2020-07-19 MED ORDER — PANTOPRAZOLE SODIUM 40 MG PO TBEC: 40.0000 mg | DELAYED_RELEASE_TABLET | Freq: Every day | ORAL | 6 refills | Status: DC

## 2020-07-24 ENCOUNTER — Telehealth: Payer: Self-pay

## 2020-07-24 NOTE — Telephone Encounter (Signed)
I called and spoke with patient's wife to let her know that patient assistance med came in from [employer]:  Ozempic 4 mg/3 mL4 boxes  I asked that they please pick up today & she stated that would be by this afternoon to pick up.

## 2020-07-26 ENCOUNTER — Ambulatory Visit: Payer: Medicare Other | Admitting: Cardiovascular Disease

## 2020-07-26 ENCOUNTER — Other Ambulatory Visit: Payer: Self-pay

## 2020-07-26 ENCOUNTER — Encounter: Payer: Self-pay | Admitting: Cardiovascular Disease

## 2020-07-26 VITALS — BP 100/60 | HR 62 | Ht 72.0 in | Wt 199.0 lb

## 2020-07-26 DIAGNOSIS — I472 Ventricular tachycardia, unspecified: Secondary | ICD-10-CM

## 2020-07-26 DIAGNOSIS — I700 Atherosclerosis of aorta: Secondary | ICD-10-CM | POA: Diagnosis not present

## 2020-07-26 DIAGNOSIS — I25118 Atherosclerotic heart disease of native coronary artery with other forms of angina pectoris: Secondary | ICD-10-CM

## 2020-07-26 DIAGNOSIS — Z95810 Presence of automatic (implantable) cardiac defibrillator: Secondary | ICD-10-CM

## 2020-07-26 DIAGNOSIS — I429 Cardiomyopathy, unspecified: Secondary | ICD-10-CM | POA: Diagnosis not present

## 2020-07-26 DIAGNOSIS — I4729 Other ventricular tachycardia: Secondary | ICD-10-CM

## 2020-07-26 DIAGNOSIS — I5022 Chronic systolic (congestive) heart failure: Secondary | ICD-10-CM | POA: Diagnosis not present

## 2020-07-26 DIAGNOSIS — I255 Ischemic cardiomyopathy: Secondary | ICD-10-CM

## 2020-07-26 NOTE — Patient Instructions (Addendum)
Medication Instructions:  No changes  If you need a refill on your cardiac medications before your next appointment, please call your pharmacy.   Lab work: No new labs needed  Testing/Procedures: No new testing needed   Follow-Up: At [facility], you and your health needs are our priority.  As part of our continuing mission to provide you with exceptional heart care, we have created designated Provider Care Teams.  These Care Teams include your primary Cardiologist (physician) and Advanced Practice Providers (APPs -  Physician Assistants and Nurse Practitioners) who all work together to provide you with the care you need, when you need it.  You will need a follow up appointment in 6 months  Providers on your designated Care Team:   Christopher Berge, NP Ryan Dunn, PA-C Jacquelyn Visser, PA-C Cadence Furth, PA-C   COVID-19 Vaccine Information can be found at: [email or web address] For questions related to vaccine distribution or appointments, please email [email or web address] or call [phone]    

## 2020-07-26 NOTE — Progress Notes (Signed)
Date:  07/26/2020   ID:  [name] [name], DOB 02-10-1940, MRN [id number]  Patient Location:  [address] [place] [address]   Provider location:   [facility], [place] office  PCP:  [name], [name]  Cardiologist:  Patsy Baltimore  Chief Complaint  Patient presents with   3 month follow up     Discuss CT ABDOMEN PELVIS W CONTRAST results. Patient c/o shortness of breath when climbing stairs. Medications reviewed by the patient verbally.      History of Present Illness:    [name] [name] is a 80 y.o. male  past medical history of CAD ,NSTEMI,  stenting was 2, and  2005 , 4/21, 6/05 May 2019 :atherectomy, drug-eluting stent  STENT RESOLUTE ONYX 4.5X18, postdilated LAD and angioplasty of diagonal VT followed by EP DM II HTN Hyperlipidemia Melanoma Abdominal aorta atherosclerosis ICD 01/2020 for EF 35% to 40% Who presents for routine follow-up of his coronary artery disease  Periodic orthostasis, better with salt and fluid Having GI issues, diarrhea, belching, GAS Started on pepcid Stopped oxympic 2 weeks, sugars 120-130  Severe URI 4 weeks ago  CT ABD/pelvis 07/11/2020 pulled up, Mild diffuse aortic athero Small pericardial effusion, anterior  No claudication on exertion  Followed by Dr. Quentin Ore, No sx of VT  ICD downloaded 4 episodes of VT in April treated successfully with ATP x 1 scheme.  Labs reviewed: A1c 7.3 LDl 35  EKG personally reviewed by myself on todays visit NSR with PVCs, rate 62 bpm  Other past medical history reviewed Cath 06/2019:  Catheterization chronically occluded RCA collaterals from left to right, Severe proximal LAD disease 80%, stent placed. Underwent angioplasty of diagonal vessel, 75% lesion Patent stent in the ramus  Echo 12/2019 Left ventricular ejection fraction, by estimation, is 35 to 40%.\ ICD implant 02/02/2020  RV lead thresholds ahd risen significantly, was decided to pursue  lead revision with suspect microdislodgement.  Echocardiogram December 2021 Ejection fraction 35 to 40%  Dr. Quentin Ore noted his eliquis 2/2 LV thrombus that had since resolved and discontinued it.  Lab work reviewed, labs were 2022 hemoglobin A1c 7.1 Total cholesterol 124 LDL Normal CBC, BMP  Non-STEMI June 2021: Hospital Catheterization chronically occluded RCA collaterals from left to right Severe proximal LAD disease 80%, stent placed Angioplasty of diagonal vessel, 75% lesion  ZIO monitor with nonsustained VT, PVCs 10% burden  Cardiac MRI Ejection fraction 35%  LGE in the basal and mid inferior and inferior lateral walls, dyskinesis of the basal and inferior inferolateral walls and a left ventricular mural thrombus located on the inferolateral wall.   Past Medical History:  Diagnosis Date   Aortic insufficiency    a. noted on TTE 2015; b. 06/2019 Echo: AI not visualized.   Arthritis    CAD (coronary artery disease)    a. remote PCI in 1991 and 2005; b. MV 3/15: old inferior MI, no ischemia, LVEF 50%, slight inferior wall hypokniesis; c. 06/2019 NSTEMI/PCI: LM nl, LAD 80p/m (Atherectomy & 4.5x18 Resolute Onyx DES), 74m/d, D1 75 (PTCA), RI patent stent, LCX nl, RCA 100p, RPAV fills via L->R collats from LCX.   Chicken pox    Colon polyps    4 pre-cancerous    Diverticulitis    DM type 2 (diabetes mellitus, type 2) (HCC)    Family history of adverse reaction to anesthesia    GERD (gastroesophageal reflux disease)    Heart murmur    History of kidney stones  HOH (hard of hearing)    Hyperlipidemia    Hypertension    Ischemic cardiomyopathy    a. TTE 2015: EF  50-55%, mild global HK; b. 06/2019 Echo: EF 45-50%, Gr1 DD, basal inf AK. Triv MR.   Kidney stones    Melanoma ([place]) 1980   Resected from his back   Mitral regurgitation    a. noted on TTE 2015   Myocardial infarction [facility])    Past Surgical History:  Procedure Laterality Date   CHOLECYSTECTOMY  2012    COLONOSCOPY     in 2003 with polyp removed and leak anastomosis had to have open abdominal surgery    [place] & 2005   CORONARY ATHERECTOMY N/A 07/08/2019   Procedure: CORONARY ATHERECTOMY;  Surgeon: Jettie Booze, MD;  Location: [place] [facility];  Service: Cardiovascular;  Laterality: N/A;   CORONARY BALLOON ANGIOPLASTY N/A 07/08/2019   Procedure: CORONARY BALLOON ANGIOPLASTY;  Surgeon: Jettie Booze, MD;  Location: [place] [facility];  Service: Cardiovascular;  Laterality: N/A;  diagonal    CORONARY STENT INTERVENTION N/A 07/08/2019   Procedure: CORONARY STENT INTERVENTION;  Surgeon: Jettie Booze, MD;  Location: [place] [facility];  Service: Cardiovascular;  Laterality: N/A;  lad   CYSTOSCOPY/URETEROSCOPY/HOLMIUM LASER/STENT PLACEMENT Right 01/02/2018   Procedure: CYSTOSCOPY/URETEROSCOPY/HOLMIUM LASER/STENT PLACEMENT;  Surgeon: [name], MD;  Location: [facility];  Service: Urology;  Laterality: Right;   ESOPHAGOGASTRODUODENOSCOPY (EGD) WITH PROPOFOL N/A 11/19/2016   Procedure: ESOPHAGOGASTRODUODENOSCOPY (EGD) WITH PROPOFOL;  Surgeon: [name], MD;  Location: [facility];  Service: Endoscopy;  Laterality: N/A;   ESOPHAGOGASTRODUODENOSCOPY (EGD) WITH PROPOFOL N/A 02/02/2019   Procedure: ESOPHAGOGASTRODUODENOSCOPY (EGD) WITH PROPOFOL;  Surgeon: [name], MD;  Location: [facility];  Service: Endoscopy;  Laterality: N/A;   ICD IMPLANT N/A 02/02/2020   Procedure: ICD IMPLANT;  Surgeon: Vickie Epley, MD;  Location: [place] [facility];  Service: Cardiovascular;  Laterality: N/A;   INTRAVASCULAR PRESSURE WIRE/FFR STUDY N/A 07/07/2019   Procedure: INTRAVASCULAR PRESSURE WIRE/FFR STUDY;  Surgeon: Nelva Bush, MD;  Location: [place] [facility];  Service: Cardiovascular;  Laterality: N/A;   INTRAVASCULAR ULTRASOUND/IVUS N/A 07/08/2019   Procedure: Intravascular Ultrasound/IVUS;  Surgeon: Jettie Booze, MD;   Location: [place] [facility];  Service: Cardiovascular;  Laterality: N/A;   LEAD REVISION/REPAIR N/A 02/16/2020   Procedure: LEAD REVISION/REPAIR;  Surgeon: Constance Haw, MD;  Location: [place] [facility];  Service: Cardiovascular;  Laterality: N/A;   LEFT HEART CATH N/A 07/08/2019   Procedure: Left Heart Cath;  Surgeon: Jettie Booze, MD;  Location: [place] [facility];  Service: Cardiovascular;  Laterality: N/A;   LEFT HEART CATH AND CORONARY ANGIOGRAPHY N/A 07/07/2019   Procedure: LEFT HEART CATH AND CORONARY ANGIOGRAPHY;  Surgeon: Nelva Bush, MD;  Location: [place] [facility];  Service: Cardiovascular;  Laterality: N/A;   LITHOTRIPSY  2015   MELANOMA EXCISION  1980   malignant   NERVE SURGERY  2015   ulna nerve   TONSILLECTOMY  1945   WISDOM TOOTH EXTRACTION       Current Meds  Medication Sig   acetaminophen (TYLENOL) 500 MG tablet Take 1,000 mg by mouth every 6 (six) hours as needed (pain.).   amiodarone (PACERONE) 200 MG tablet Take 200 mg by mouth daily.   aspirin EC 81 MG tablet Take 1 tablet (81 mg total) by mouth daily. Swallow whole.   carvedilol (COREG) 6.25 MG tablet Take 6.25 mg  by mouth daily.   clopidogrel (PLAVIX) 75 MG tablet TAKE 1 TABLET(75 MG) BY MOUTH DAILY WITH BREAKFAST   ezetimibe (ZETIA) 10 MG tablet TAKE 1 TABLET(10 MG) BY MOUTH DAILY   famotidine (PEPCID) 20 MG tablet Take 20 mg by mouth daily.   fluticasone (FLONASE) 50 MCG/ACT nasal spray Place into both nostrils daily as needed for allergies or rhinitis.   glipiZIDE (GLUCOTROL) 10 MG tablet Take 1 tablet (10 mg total) by mouth 2 (two) times daily before a meal. Take with largest meals.   lisinopril (ZESTRIL) 10 MG tablet Take 10 mg by mouth daily.   midodrine (PROAMATINE) 5 MG tablet Take 1 tablet (5 mg total) by mouth 2 (two) times daily as needed. Take when BP does drop less than 110 when standing. Additional dose at 2 pm if still dropping below 110 when standing   Multiple Vitamin  (MULTIVITAMIN WITH MINERALS) TABS tablet Take 1 tablet by mouth daily with lunch.   nitroGLYCERIN (NITROSTAT) 0.4 MG SL tablet Place 1 tablet (0.4 mg total) under the tongue every 5 (five) minutes x 3 doses as needed for chest pain.   ONETOUCH VERIO test strip USE TO CHECK BLOOD SUGAR TWICE DAILY   pantoprazole (PROTONIX) 40 MG tablet Take 1 tablet (40 mg total) by mouth daily.   Polyvinyl Alcohol-Povidone (MURINE TEARS FOR DRY EYES OP) Place 1-2 drops into both eyes as needed (for dry eyes).    psyllium (METAMUCIL) 58.6 % powder Take 1 packet by mouth at bedtime.   rosuvastatin (CRESTOR) 10 MG tablet Take 10 mg by mouth daily.   sodium chloride (OCEAN) 0.65 % SOLN nasal spray Place 1 spray into both nostrils as needed for congestion.   spironolactone (ALDACTONE) 25 MG tablet Take 25 mg by mouth daily.   tamsulosin (FLOMAX) 0.4 MG CAPS capsule TAKE 1 CAPSULE(0.4 MG) BY MOUTH DAILY   traMADol (ULTRAM) 50 MG tablet TAKE 1 TABLET BY MOUTH EVERY 12 HOURS AS NEEDED     Allergies:   Metformin and related, Azithromycin, Other, Percocet [oxycodone-acetaminophen], and Jardiance [empagliflozin]   Social History   Tobacco Use   Smoking status: Some Days    Pack years: 0.00    Types: Pipe   Smokeless tobacco: Former    Quit date: 10/17/2015  Vaping Use   Vaping Use: Never used  Substance Use Topics   Alcohol use: Not Currently    Alcohol/week: 0.0 - 1.0 standard drinks   Drug use: No     Family Hx: The patient's family history includes Colon cancer in his father; Diabetes in his father and mother; Heart attack in his mother; Heart disease in his mother; Hyperlipidemia in his mother; Hypertension in his mother; Kidney cancer in his father; Liver cancer in his father; Lung cancer in his father. There is no history of Bladder Cancer or Prostate cancer.  ROS:   Please see the history of present illness.    Review of Systems  Constitutional: Negative.   HENT: Negative.    Respiratory: Negative.     Cardiovascular: Negative.   Gastrointestinal: Negative.   Neurological: Negative.   Psychiatric/Behavioral: Negative.    All other systems reviewed and are negative.   Labs/Other Tests and Data Reviewed:    Recent Labs: 03/28/2020: TSH 1.36 04/19/2020: Hemoglobin 14.6; Platelets 163 05/20/2020: ALT 36; BUN 19; Magnesium 1.4; Potassium 4.1; Sodium 137 07/11/2020: Creatinine, Ser 0.80   Recent Lipid Panel Lab Results  Component Value Date/Time   CHOL 124 12/21/2019 09:19 AM  TRIG 267 (H) 12/21/2019 09:19 AM   HDL 36 (L) 12/21/2019 09:19 AM   CHOLHDL 3.4 12/21/2019 09:19 AM   LDLCALC 35 12/21/2019 09:19 AM   LDLDIRECT 55.0 02/10/2019 09:03 AM    Wt Readings from Last 3 Encounters:  07/26/20 199 lb (90.3 kg)  05/03/20 204 lb (92.5 kg)  04/21/20 204 lb 4 oz (92.6 kg)     Exam:    BP (!) 80/60 (BP Location: Left Arm, Patient Position: Sitting, Cuff Size: Normal)   Pulse 62   Ht 6' (1.829 m)   Wt 199 lb (90.3 kg)   SpO2 97%   BMI 26.99 kg/m   Constitutional:  oriented to person, place, and time. No distress.  HENT:  [name]: Grossly normal Eyes:  no discharge. No scleral icterus.  Neck: No JVD, no carotid bruits  Cardiovascular: Regular rate and rhythm, no murmurs appreciated Pulmonary/Chest: Clear to auscultation bilaterally, no wheezes or rails Abdominal: Soft.  no distension.  no tenderness.  Musculoskeletal: Normal range of motion Neurological:  normal muscle tone. Coordination normal. No atrophy Skin: Skin warm and dry Psychiatric: normal affect, pleasant    ASSESSMENT & PLAN:    Sustained VT close follow-up with EP On amio and coreg  Atherosclerosis of native coronary artery with stable angina pectoris, unspecified whether native or transplanted heart (HCC) Ischemic cardiomyopathy, ejection fraction 35% on MRI Echo 35 to 40% Continue carvedilol spironolactone and lisinopril  Diarrhea:  likely contributing to low pressure  Cardiomyopathy,  ischemic Coreg, lisinopril, spironolactone EF 35 to 40% No changes, low pressure  Nonsustained VT, PVCs Has  ICD, followed EP Lead revision successful sustained VT requiring antitachycardia pacing  Atherosclerosis of abdominal aorta (HCC)  Images reviewed, mild disease No claudication sx Lipids at goal  Type 2 diabetes mellitus with other circulatory complication, without long-term current use of insulin (HCC) Stable, off oxympic, GI issues A1C 7.3  Carotid artery disease, unspecified laterality (HCC) U/s in 2017 Minor carotid atherosclerosis.  No hemodynamically significant disease   Total encounter time more than 25 minutes  Greater than 50% was spent in counseling and coordination of care with the patient    Signed, Ida Rogue, MD  07/26/2020 10:07 AM    [place] Office [address] [place] [address], [place], [place] [address]

## 2020-07-28 ENCOUNTER — Encounter: Payer: Self-pay | Admitting: Family

## 2020-07-28 ENCOUNTER — Ambulatory Visit (INDEPENDENT_AMBULATORY_CARE_PROVIDER_SITE_OTHER): Payer: Medicare Other | Admitting: Family

## 2020-07-28 ENCOUNTER — Other Ambulatory Visit: Payer: Self-pay

## 2020-07-28 ENCOUNTER — Ambulatory Visit
Admission: RE | Admit: 2020-07-28 | Discharge: 2020-07-28 | Disposition: A | Discharge status: Home / Self Care | Payer: Medicare Other | Source: Ambulatory Visit | Attending: Family | Admitting: Family

## 2020-07-28 VITALS — BP 99/50 | HR 60 | Temp 98.0°F | Ht 72.0 in | Wt 200.0 lb

## 2020-07-28 DIAGNOSIS — R911 Solitary pulmonary nodule: Secondary | ICD-10-CM | POA: Insufficient documentation

## 2020-07-28 DIAGNOSIS — I10 Essential (primary) hypertension: Secondary | ICD-10-CM | POA: Diagnosis not present

## 2020-07-28 DIAGNOSIS — N2889 Other specified disorders of kidney and ureter: Secondary | ICD-10-CM | POA: Diagnosis not present

## 2020-07-28 DIAGNOSIS — M542 Cervicalgia: Secondary | ICD-10-CM

## 2020-07-28 DIAGNOSIS — K219 Gastro-esophageal reflux disease without esophagitis: Secondary | ICD-10-CM | POA: Diagnosis not present

## 2020-07-28 DIAGNOSIS — E1165 Type 2 diabetes mellitus with hyperglycemia: Secondary | ICD-10-CM

## 2020-07-28 DIAGNOSIS — I255 Ischemic cardiomyopathy: Secondary | ICD-10-CM | POA: Diagnosis not present

## 2020-07-28 DIAGNOSIS — E118 Type 2 diabetes mellitus with unspecified complications: Secondary | ICD-10-CM

## 2020-07-28 DIAGNOSIS — I700 Atherosclerosis of aorta: Secondary | ICD-10-CM | POA: Diagnosis not present

## 2020-07-28 DIAGNOSIS — I313 Pericardial effusion (noninflammatory): Secondary | ICD-10-CM | POA: Diagnosis not present

## 2020-07-28 DIAGNOSIS — R918 Other nonspecific abnormal finding of lung field: Secondary | ICD-10-CM | POA: Diagnosis not present

## 2020-07-28 LAB — POCT GLYCOSYLATED HEMOGLOBIN (HGB A1C): Hemoglobin A1C: 6.5 % — AB (ref 4.0–5.6)

## 2020-07-28 NOTE — Patient Instructions (Signed)
Nice to see you Lets resume ozempic 1mg  and see if GI symptoms return

## 2020-07-28 NOTE — Progress Notes (Signed)
Subjective:    Patient ID: [name] [name], male    DOB: 19-Dec-1940, 80 y.o.   MRN: [id number]  CC: [name] is a 80 y.o. male who presents today for follow up and to review CT a/p   HPI: Accompanied by wife  He is feeling better after starting pepcid a1c 20mg  qhs.  Diarrhea, belching improved. Reports 1-2 episodes of diarrhea over a 2 week period which is related to eating dietary choices such sausage. He stopped taking magnesium which also helped with loose stool.  Seeing Dr [name] 08/15/20   Neck pain resolved since changing neck pillow. Chronic peripheral upper and lower neuropathy, unchanged.         CT a/p  Right adrenal nodule, BL renal cysts Advanced aortoiliac atherosclerotic disease   Continue pantoprozole 40mg  qd Consider GI consult, colonoscopy  US renal scheduled 08/23/20  DM- currently holding ozempic  1mg  for 2 weeks. Compliant with glipizide 10mg  BID.  Average Glucose 136, 130, 135.    Upcoming urology consult with [name] 08/30/20 after adrenal, renal abnormalities seen  Occasional orthostatis. Compliant with coreg 6.25 mg qd, spironolactone 25mg  , lisinopril 10mg   Taken midodrine 5mg  twice total.  Lung nodule- Ct chest scheduled today Follow up with [name] 07/26/20 whom reviewed pericardial effusion , atherosclerosis seen on CT a/p  HISTORY:  Past Medical History:  Diagnosis Date   Aortic insufficiency    a. noted on TTE 2015; b. 06/2019 Echo: AI not visualized.   Arthritis    CAD (coronary artery disease)    a. remote PCI in 1991 and 2005; b. MV 3/15: old inferior MI, no ischemia, LVEF 50%, slight inferior wall hypokniesis; c. 06/2019 NSTEMI/PCI: LM nl, LAD 80p/m (Atherectomy & 4.5x18 Resolute Onyx DES), 29m/d, D1 75 (PTCA), RI patent stent, LCX nl, RCA 100p, RPAV fills via L->R collats from LCX.   Chicken pox    Colon polyps    4 pre-cancerous    Diverticulitis    DM type 2 (diabetes mellitus, type 2) (HCC)    Family history of  adverse reaction to anesthesia    GERD (gastroesophageal reflux disease)    Heart murmur    History of kidney stones    HOH (hard of hearing)    Hyperlipidemia    Hypertension    Ischemic cardiomyopathy    a. TTE 2015: EF  50-55%, mild global HK; b. 06/2019 Echo: EF 45-50%, Gr1 DD, basal inf AK. Triv MR.   Kidney stones    Melanoma ([place]) 1980   Resected from his back   Mitral regurgitation    a. noted on TTE 2015   Myocardial infarction [facility])    Past Surgical History:  Procedure Laterality Date   CHOLECYSTECTOMY  2012   COLONOSCOPY     in 2003 with polyp removed and leak anastomosis had to have open abdominal surgery    [place] & 2005   CORONARY ATHERECTOMY N/A 07/08/2019   Procedure: CORONARY ATHERECTOMY;  Surgeon: Jettie Booze, MD;  Location: [place] [facility];  Service: Cardiovascular;  Laterality: N/A;   CORONARY BALLOON ANGIOPLASTY N/A 07/08/2019   Procedure: CORONARY BALLOON ANGIOPLASTY;  Surgeon: Jettie Booze, MD;  Location: [place] [facility];  Service: Cardiovascular;  Laterality: N/A;  diagonal    CORONARY STENT INTERVENTION N/A 07/08/2019   Procedure: CORONARY STENT INTERVENTION;  Surgeon: Jettie Booze, MD;  Location: [place] [facility];  Service: Cardiovascular;  Laterality: N/A;  lad  CYSTOSCOPY/URETEROSCOPY/HOLMIUM LASER/STENT PLACEMENT Right 01/02/2018   Procedure: CYSTOSCOPY/URETEROSCOPY/HOLMIUM LASER/STENT PLACEMENT;  Surgeon: [name], MD;  Location: [facility];  Service: Urology;  Laterality: Right;   ESOPHAGOGASTRODUODENOSCOPY (EGD) WITH PROPOFOL N/A 11/19/2016   Procedure: ESOPHAGOGASTRODUODENOSCOPY (EGD) WITH PROPOFOL;  Surgeon: [name], MD;  Location: [facility];  Service: Endoscopy;  Laterality: N/A;   ESOPHAGOGASTRODUODENOSCOPY (EGD) WITH PROPOFOL N/A 02/02/2019   Procedure: ESOPHAGOGASTRODUODENOSCOPY (EGD) WITH PROPOFOL;  Surgeon: [name], MD;  Location: [facility];   Service: Endoscopy;  Laterality: N/A;   ICD IMPLANT N/A 02/02/2020   Procedure: ICD IMPLANT;  Surgeon: Vickie Epley, MD;  Location: [place] [facility];  Service: Cardiovascular;  Laterality: N/A;   INTRAVASCULAR PRESSURE WIRE/FFR STUDY N/A 07/07/2019   Procedure: INTRAVASCULAR PRESSURE WIRE/FFR STUDY;  Surgeon: Nelva Bush, MD;  Location: [place] [facility];  Service: Cardiovascular;  Laterality: N/A;   INTRAVASCULAR ULTRASOUND/IVUS N/A 07/08/2019   Procedure: Intravascular Ultrasound/IVUS;  Surgeon: Jettie Booze, MD;  Location: [place] [facility];  Service: Cardiovascular;  Laterality: N/A;   LEAD REVISION/REPAIR N/A 02/16/2020   Procedure: LEAD REVISION/REPAIR;  Surgeon: Constance Haw, MD;  Location: [place] [facility];  Service: Cardiovascular;  Laterality: N/A;   LEFT HEART CATH N/A 07/08/2019   Procedure: Left Heart Cath;  Surgeon: Jettie Booze, MD;  Location: [place] [facility];  Service: Cardiovascular;  Laterality: N/A;   LEFT HEART CATH AND CORONARY ANGIOGRAPHY N/A 07/07/2019   Procedure: LEFT HEART CATH AND CORONARY ANGIOGRAPHY;  Surgeon: Nelva Bush, MD;  Location: [place] [facility];  Service: Cardiovascular;  Laterality: N/A;   LITHOTRIPSY  2015   MELANOMA EXCISION  1980   malignant   NERVE SURGERY  2015   ulna nerve   TONSILLECTOMY  1945   WISDOM TOOTH EXTRACTION     Family History  Problem Relation Age of Onset   Hyperlipidemia Mother    Hypertension Mother    Heart disease Mother    Diabetes Mother    Heart attack Mother    Colon cancer Father        metasized to liver, adrenal, lungs   Lung cancer Father    Kidney cancer Father        malignant capsulated kidney tumor   Diabetes Father    Liver cancer Father    Bladder Cancer Neg Hx    Prostate cancer Neg Hx     Allergies: Metformin and related, Azithromycin, Other, Percocet [oxycodone-acetaminophen], and Jardiance [empagliflozin] Current Outpatient Medications on File  Prior to Visit  Medication Sig Dispense Refill   acetaminophen (TYLENOL) 500 MG tablet Take 1,000 mg by mouth every 6 (six) hours as needed (pain.).     amiodarone (PACERONE) 200 MG tablet Take 200 mg by mouth daily.     aspirin EC 81 MG tablet Take 1 tablet (81 mg total) by mouth daily. Swallow whole. 90 tablet 3   carvedilol (COREG) 6.25 MG tablet Take 6.25 mg by mouth daily.     clopidogrel (PLAVIX) 75 MG tablet TAKE 1 TABLET(75 MG) BY MOUTH DAILY WITH BREAKFAST 60 tablet 3   ezetimibe (ZETIA) 10 MG tablet TAKE 1 TABLET(10 MG) BY MOUTH DAILY 90 tablet 0   famotidine (PEPCID) 20 MG tablet Take 20 mg by mouth daily.     fluticasone (FLONASE) 50 MCG/ACT nasal spray Place into both nostrils daily as needed for allergies or rhinitis.     glipiZIDE (GLUCOTROL) 10 MG tablet Take 1 tablet (10 mg total) by mouth 2 (two)  times daily before a meal. Take with largest meals. 120 tablet 3   lisinopril (ZESTRIL) 10 MG tablet Take 10 mg by mouth daily.     midodrine (PROAMATINE) 5 MG tablet Take 1 tablet (5 mg total) by mouth 2 (two) times daily as needed. Take when BP does drop less than 110 when standing. Additional dose at 2 pm if still dropping below 110 when standing 90 tablet 3   Multiple Vitamin (MULTIVITAMIN WITH MINERALS) TABS tablet Take 1 tablet by mouth daily with lunch.     nitroGLYCERIN (NITROSTAT) 0.4 MG SL tablet Place 1 tablet (0.4 mg total) under the tongue every 5 (five) minutes x 3 doses as needed for chest pain. 25 tablet 0   ONETOUCH VERIO test strip USE TO CHECK BLOOD SUGAR TWICE DAILY 100 strip 1   pantoprazole (PROTONIX) 40 MG tablet Take 1 tablet (40 mg total) by mouth daily. 30 tablet 6   Polyvinyl Alcohol-Povidone (MURINE TEARS FOR DRY EYES OP) Place 1-2 drops into both eyes as needed (for dry eyes).      psyllium (METAMUCIL) 58.6 % powder Take 1 packet by mouth at bedtime.     rosuvastatin (CRESTOR) 10 MG tablet Take 10 mg by mouth daily.     sodium chloride (OCEAN) 0.65 % SOLN  nasal spray Place 1 spray into both nostrils as needed for congestion.     spironolactone (ALDACTONE) 25 MG tablet Take 25 mg by mouth daily.     tamsulosin (FLOMAX) 0.4 MG CAPS capsule TAKE 1 CAPSULE(0.4 MG) BY MOUTH DAILY 90 capsule 3   traMADol (ULTRAM) 50 MG tablet TAKE 1 TABLET BY MOUTH EVERY 12 HOURS AS NEEDED 60 tablet 1   Semaglutide, 1 MG/DOSE, (OZEMPIC, 1 MG/DOSE,) 4 MG/3ML SOPN ADMINISTER 1 MG UNDER THE SKIN 1 TIME A WEEK (Patient not taking: Reported on 07/28/2020) 3 mL 1   No current facility-administered medications on file prior to visit.    Social History   Tobacco Use   Smoking status: Some Days    Types: Pipe   Smokeless tobacco: Former    Quit date: 10/17/2015  Vaping Use   Vaping Use: Never used  Substance Use Topics   Alcohol use: Not Currently    Alcohol/week: 0.0 - 1.0 standard drinks   Drug use: No    Review of Systems  Constitutional:  Negative for chills and fever.  Respiratory:  Negative for cough.   Cardiovascular:  Negative for chest pain and palpitations.  Gastrointestinal:  Negative for abdominal distention, abdominal pain, nausea and vomiting.  Musculoskeletal:  Negative for neck pain (resolved).     Objective:    BP (!) 99/50 (BP Location: Left Arm, Patient Position: Sitting, Cuff Size: Large)   Pulse 60   Temp 98 F (36.7 C) (Oral)   Ht 6' (1.829 m)   Wt 200 lb (90.7 kg)   SpO2 97%   BMI 27.12 kg/m  BP Readings from Last 3 Encounters:  07/28/20 (!) 99/50  07/26/20 100/60  05/03/20 101/64   Wt Readings from Last 3 Encounters:  07/28/20 200 lb (90.7 kg)  07/26/20 199 lb (90.3 kg)  05/03/20 204 lb (92.5 kg)    Physical Exam Vitals reviewed.  Constitutional:      Appearance: He is well-developed.  Cardiovascular:     Rate and Rhythm: Regular rhythm.     Heart sounds: Normal heart sounds.  Pulmonary:     Effort: Pulmonary effort is normal. No respiratory distress.     Breath  sounds: Normal breath sounds. No wheezing, rhonchi or  rales.  Skin:    General: Skin is warm and dry.  Neurological:     Mental Status: He is alert.  Psychiatric:        Speech: Speech normal.        Behavior: Behavior normal.       Assessment & Plan:   Problem List Items Addressed This Visit       Cardiovascular and Mediastinum   Essential hypertension    Controlled. Continue coreg 6.25 mg qd, spironolactone 25mg  , lisinopril 10mg  . In setting of occasional orthostasis, advised compression stocking and to use midodrine 5mg  as directed by [name] as needed.        Ischemic cardiomyopathy     Digestive   GERD (gastroesophageal reflux disease)    Controlled.Constellation of belching, diarrhea improved. Agree that in part related to GERD. Continue pepcic ac 20mg  qhs, protonix 40mg . He has follow up with Dr [name], will follow.           Endocrine   Controlled type 2 diabetes mellitus with complication, without long-term current use of insulin (HCC)    Lab Results  Component Value Date   HGBA1C 6.5 (A) 07/28/2020  Controlled. Resume ozempic ( he held for 2 weeks) and monitor for any increase of belching. Continue glipizide 10mg  BID         Other   Neck pain    Resolved. Declines further work up for this complaint or  Xr cervical spine today. Of note He cannot have MRI cervical spine due to pacemaker.        Right renal mass    US renal scheduled as well as upcoming appointment with urology to discuss adrenal, urological findings on CT a/p. Will follow.        Other Visit Diagnoses     Type 2 diabetes mellitus with hyperglycemia, without long-term current use of insulin ([place])    -  Primary   Relevant Orders   POCT HgB A1C (Completed)   Ambulatory referral to Podiatry        I am having [name] "Ben" maintain his psyllium, Polyvinyl Alcohol-Povidone (MURINE TEARS FOR DRY EYES OP), sodium chloride, nitroGLYCERIN, tamsulosin, Ozempic (1 MG/DOSE), acetaminophen, multivitamin with minerals, fluticasone,  rosuvastatin, aspirin EC, glipiZIDE, spironolactone, lisinopril, carvedilol, ezetimibe, clopidogrel, midodrine, OneTouch Verio, traMADol, pantoprazole, famotidine, and amiodarone.   No orders of the defined types were placed in this encounter.   Return precautions given.   Risks, benefits, and alternatives of the medications and treatment plan prescribed today were discussed, and patient expressed understanding.   Education regarding symptom management and diagnosis given to patient on AVS.  Continue to follow with [name], [name] for routine health maintenance.   [name] and I agreed with plan.   [name], [name]

## 2020-07-28 NOTE — Assessment & Plan Note (Addendum)
Resolved. Declines further work up for this complaint or  Xr cervical spine today. Of note He cannot have MRI cervical spine due to pacemaker.

## 2020-07-29 ENCOUNTER — Other Ambulatory Visit: Payer: Self-pay | Admitting: Cardiovascular Disease

## 2020-07-31 DIAGNOSIS — R911 Solitary pulmonary nodule: Secondary | ICD-10-CM | POA: Insufficient documentation

## 2020-07-31 NOTE — Assessment & Plan Note (Signed)
Controlled.Constellation of belching, diarrhea improved. Agree that in part related to GERD. Continue pepcic ac 20mg  qhs, protonix 40mg . He has follow up with Dr [name], will follow.

## 2020-07-31 NOTE — Assessment & Plan Note (Signed)
Reviewed CT a/p and small bibasilar subpleural nodular density . Pending CT chest.

## 2020-07-31 NOTE — Assessment & Plan Note (Signed)
Lab Results  Component Value Date   HGBA1C 6.5 (A) 07/28/2020   Controlled. Resume ozempic ( he held for 2 weeks) and monitor for any increase of belching. Continue glipizide 10mg  BID

## 2020-07-31 NOTE — Assessment & Plan Note (Addendum)
Controlled. Continue coreg 6.25 mg qd, spironolactone 25mg  , lisinopril 10mg  . In setting of occasional orthostasis, advised compression stocking and to use midodrine 5mg  as directed by [name] as needed.

## 2020-07-31 NOTE — Assessment & Plan Note (Signed)
US renal scheduled as well as upcoming appointment with urology to discuss adrenal, urological findings on CT a/p. Will follow.

## 2020-08-01 ENCOUNTER — Other Ambulatory Visit: Payer: Self-pay | Admitting: Family

## 2020-08-01 ENCOUNTER — Ambulatory Visit (INDEPENDENT_AMBULATORY_CARE_PROVIDER_SITE_OTHER): Payer: Medicare Other

## 2020-08-01 DIAGNOSIS — I255 Ischemic cardiomyopathy: Secondary | ICD-10-CM

## 2020-08-01 DIAGNOSIS — R911 Solitary pulmonary nodule: Secondary | ICD-10-CM

## 2020-08-01 LAB — CUP PACEART REMOTE DEVICE CHECK
Battery Remaining Longevity: 180 mo
Battery Remaining Percentage: 100 %
Brady Statistic RV Percent Paced: 1 %
Date Time Interrogation Session: 20220719002000
HighPow Impedance: 78 Ohm
Implantable Lead Implant Date: 20220202
Implantable Lead Location: 753860
Implantable Lead Model: 273
Implantable Lead Serial Number: 111255
Implantable Pulse Generator Implant Date: 20220119
Lead Channel Impedance Value: 438 Ohm
Lead Channel Pacing Threshold Amplitude: 0.7 V
Lead Channel Pacing Threshold Pulse Width: 0.4 ms
Lead Channel Setting Pacing Amplitude: 2 V
Lead Channel Setting Pacing Pulse Width: 0.4 ms
Lead Channel Setting Sensing Sensitivity: 0.5 mV
Pulse Gen Serial Number: 291993

## 2020-08-02 ENCOUNTER — Encounter: Payer: Self-pay | Admitting: Cardiology

## 2020-08-02 ENCOUNTER — Ambulatory Visit (INDEPENDENT_AMBULATORY_CARE_PROVIDER_SITE_OTHER): Payer: Medicare Other | Admitting: Cardiology

## 2020-08-02 ENCOUNTER — Other Ambulatory Visit: Payer: Self-pay

## 2020-08-02 VITALS — BP 120/72 | HR 58 | Ht 72.0 in | Wt 201.0 lb

## 2020-08-02 DIAGNOSIS — Z95810 Presence of automatic (implantable) cardiac defibrillator: Secondary | ICD-10-CM | POA: Diagnosis not present

## 2020-08-02 DIAGNOSIS — I5022 Chronic systolic (congestive) heart failure: Secondary | ICD-10-CM

## 2020-08-02 DIAGNOSIS — I472 Ventricular tachycardia, unspecified: Secondary | ICD-10-CM

## 2020-08-02 DIAGNOSIS — I25118 Atherosclerotic heart disease of native coronary artery with other forms of angina pectoris: Secondary | ICD-10-CM

## 2020-08-02 DIAGNOSIS — I513 Intracardiac thrombosis, not elsewhere classified: Secondary | ICD-10-CM | POA: Diagnosis not present

## 2020-08-02 DIAGNOSIS — I255 Ischemic cardiomyopathy: Secondary | ICD-10-CM | POA: Diagnosis not present

## 2020-08-02 DIAGNOSIS — Z79899 Other long term (current) drug therapy: Secondary | ICD-10-CM | POA: Diagnosis not present

## 2020-08-02 NOTE — Progress Notes (Signed)
Electrophysiology Office Follow up Visit Note:    Date:  08/02/2020   ID:  [name] [name], DOB 1940-06-02, MRN [id number]  PCP:  [name], [facility] Cardiologist:  Ida Rogue, MD  [facility] Electrophysiologist:  Vickie Epley, MD    Interval History:    [name] [name] is a 80 y.o. male who presents for a follow up visit.  I last saw the patient via telemedicine on Jun 07, 2020.  He was started on amiodarone in the recent past for ICD therapies.  We have also been trying to normalize his electrolytes in hopes of reducing his arrhythmia burden.  Since I last saw the patient, he has been doing well.  He has not had any further ATP therapies since starting amiodarone.  He is with his wife today in clinic who have previously met.  He tells me in the morning he is having some diarrhea which occurs after he drinks 3 cups of coffee.     Past Medical History:  Diagnosis Date   Aortic insufficiency    a. noted on TTE 2015; b. 06/2019 Echo: AI not visualized.   Arthritis    CAD (coronary artery disease)    a. remote PCI in 1991 and 2005; b. MV 3/15: old inferior MI, no ischemia, LVEF 50%, slight inferior wall hypokniesis; c. 06/2019 NSTEMI/PCI: LM nl, LAD 80p/m (Atherectomy & 4.5x18 Resolute Onyx DES), 30md, D1 75 (PTCA), RI patent stent, LCX nl, RCA 100p, RPAV fills via L->R collats from LCX.   Chicken pox    Colon polyps    4 pre-cancerous    Diverticulitis    DM type 2 (diabetes mellitus, type 2) (HCC)    Family history of adverse reaction to anesthesia    GERD (gastroesophageal reflux disease)    Heart murmur    History of kidney stones    HOH (hard of hearing)    Hyperlipidemia    Hypertension    Ischemic cardiomyopathy    a. TTE 2015: EF  50-55%, mild global HK; b. 06/2019 Echo: EF 45-50%, Gr1 DD, basal inf AK. Triv MR.   Kidney stones    Melanoma ([place] 1980   Resected from his back   Mitral regurgitation    a. noted on TTE 2015    Myocardial infarction ([facility]     Past Surgical History:  Procedure Laterality Date   CHOLECYSTECTOMY  2012   COLONOSCOPY     in 2003 with polyp removed and leak anastomosis had to have open abdominal surgery    [place]& 2005   CORONARY ATHERECTOMY N/A 07/08/2019   Procedure: CORONARY ATHERECTOMY;  Surgeon: VJettie Booze MD;  [place];  Service: Cardiovascular;  Laterality: N/A;   CORONARY BALLOON ANGIOPLASTY N/A 07/08/2019   Procedure: CORONARY BALLOON ANGIOPLASTY;  Surgeon: VJettie Booze MD;  [place] [facility];  Service: Cardiovascular;  Laterality: N/A;  diagonal    CORONARY STENT INTERVENTION N/A 07/08/2019   Procedure: CORONARY STENT INTERVENTION;  Surgeon: VJettie Booze MD;  [place] [facility];  Service: Cardiovascular;  Laterality: N/A;  lad   CYSTOSCOPY/URETEROSCOPY/HOLMIUM LASER/STENT PLACEMENT Right 01/02/2018   Procedure: CYSTOSCOPY/URETEROSCOPY/HOLMIUM LASER/STENT PLACEMENT;  Surgeon: SBilley Co MD;  Location: [facility] ORS;  Service: Urology;  Laterality: Right;   ESOPHAGOGASTRODUODENOSCOPY (EGD) WITH PROPOFOL N/A 11/19/2016   Procedure: ESOPHAGOGASTRODUODENOSCOPY (EGD) WITH PROPOFOL;  Surgeon: WLucilla Lame MD;  Location: [facility] ENDOSCOPY;  Service: Endoscopy;  Laterality:  N/A;   ESOPHAGOGASTRODUODENOSCOPY (EGD) WITH PROPOFOL N/A 02/02/2019   Procedure: ESOPHAGOGASTRODUODENOSCOPY (EGD) WITH PROPOFOL;  Surgeon: [name], MD;  Location: [facility] ENDOSCOPY;  Service: Endoscopy;  Laterality: N/A;   ICD IMPLANT N/A 02/02/2020   Procedure: ICD IMPLANT;  Surgeon: Vickie Epley, MD;  Location: [place] CV LAB;  Service: Cardiovascular;  Laterality: N/A;   INTRAVASCULAR PRESSURE WIRE/FFR STUDY N/A 07/07/2019   Procedure: INTRAVASCULAR PRESSURE WIRE/FFR STUDY;  Surgeon: Nelva Bush, MD;  Location: [place] CV LAB;  Service: Cardiovascular;  Laterality: N/A;    INTRAVASCULAR ULTRASOUND/IVUS N/A 07/08/2019   Procedure: Intravascular Ultrasound/IVUS;  Surgeon: Jettie Booze, MD;  Location: [place] CV LAB;  Service: Cardiovascular;  Laterality: N/A;   LEAD REVISION/REPAIR N/A 02/16/2020   Procedure: LEAD REVISION/REPAIR;  Surgeon: Constance Haw, MD;  Location: [place] CV LAB;  Service: Cardiovascular;  Laterality: N/A;   LEFT HEART CATH N/A 07/08/2019   Procedure: Left Heart Cath;  Surgeon: Jettie Booze, MD;  Location: [place] CV LAB;  Service: Cardiovascular;  Laterality: N/A;   LEFT HEART CATH AND CORONARY ANGIOGRAPHY N/A 07/07/2019   Procedure: LEFT HEART CATH AND CORONARY ANGIOGRAPHY;  Surgeon: Nelva Bush, MD;  Location: [place] CV LAB;  Service: Cardiovascular;  Laterality: N/A;   LITHOTRIPSY  2015   MELANOMA EXCISION  1980   malignant   NERVE SURGERY  2015   ulna nerve   TONSILLECTOMY  1945   WISDOM TOOTH EXTRACTION      Current Medications: Current Meds  Medication Sig   acetaminophen (TYLENOL) 500 MG tablet Take 1,000 mg by mouth every 6 (six) hours as needed (pain.).   amiodarone (PACERONE) 200 MG tablet Take 200 mg by mouth daily.   aspirin EC 81 MG tablet Take 1 tablet (81 mg total) by mouth daily. Swallow whole.   carvedilol (COREG) 6.25 MG tablet Take 6.25 mg by mouth daily.   clopidogrel (PLAVIX) 75 MG tablet TAKE 1 TABLET(75 MG) BY MOUTH DAILY WITH BREAKFAST   ezetimibe (ZETIA) 10 MG tablet TAKE 1 TABLET(10 MG) BY MOUTH DAILY   famotidine (PEPCID) 20 MG tablet Take 20 mg by mouth daily.   fluticasone (FLONASE) 50 MCG/ACT nasal spray Place into both nostrils daily as needed for allergies or rhinitis.   glipiZIDE (GLUCOTROL) 10 MG tablet Take 1 tablet (10 mg total) by mouth 2 (two) times daily before a meal. Take with largest meals.   lisinopril (ZESTRIL) 10 MG tablet Take 10 mg by mouth daily.   midodrine (PROAMATINE) 5 MG tablet Take 1 tablet (5 mg total) by mouth 2 (two) times daily as  needed. Take when BP does drop less than 110 when standing. Additional dose at 2 pm if still dropping below 110 when standing   Multiple Vitamin (MULTIVITAMIN WITH MINERALS) TABS tablet Take 1 tablet by mouth daily with lunch.   nitroGLYCERIN (NITROSTAT) 0.4 MG SL tablet Place 1 tablet (0.4 mg total) under the tongue every 5 (five) minutes x 3 doses as needed for chest pain.   ONETOUCH VERIO test strip USE TO CHECK BLOOD SUGAR TWICE DAILY   pantoprazole (PROTONIX) 40 MG tablet Take 1 tablet (40 mg total) by mouth daily.   Polyvinyl Alcohol-Povidone (MURINE TEARS FOR DRY EYES OP) Place 1-2 drops into both eyes as needed (for dry eyes).    psyllium (METAMUCIL) 58.6 % powder Take 1 packet by mouth at bedtime.   rosuvastatin (CRESTOR) 10 MG tablet Take 10 mg by mouth daily.   sodium chloride (  OCEAN) 0.65 % SOLN nasal spray Place 1 spray into both nostrils as needed for congestion.   spironolactone (ALDACTONE) 25 MG tablet Take 25 mg by mouth daily.   tamsulosin (FLOMAX) 0.4 MG CAPS capsule TAKE 1 CAPSULE(0.4 MG) BY MOUTH DAILY   traMADol (ULTRAM) 50 MG tablet TAKE 1 TABLET BY MOUTH EVERY 12 HOURS AS NEEDED     Allergies:   Metformin and related, Azithromycin, Other, Percocet [oxycodone-acetaminophen], and Jardiance [empagliflozin]   Social History   Socioeconomic History   Marital status: Married    Spouse name: [place]   Number of children: Not on file   Years of education: Not on file   Highest education level: Not on file  Occupational History   Not on file  Tobacco Use   Smoking status: Some Days    Types: Pipe   Smokeless tobacco: Former    Quit date: 10/17/2015  Vaping Use   Vaping Use: Never used  Substance and Sexual Activity   Alcohol use: Not Currently    Alcohol/week: 0.0 - 1.0 standard drinks   Drug use: No   Sexual activity: Not on file  Other Topics Concern   Not on file  Social History Narrative   Married    Social Determinants of Health   Financial Resource  Strain: Medium Risk   Difficulty of Paying Living Expenses: Somewhat hard  Food Insecurity: Not on file  Transportation Needs: Not on file  Physical Activity: Not on file  Stress: Not on file  Social Connections: Not on file     Family History: The patient's family history includes Colon cancer in his father; Diabetes in his father and mother; Heart attack in his mother; Heart disease in his mother; Hyperlipidemia in his mother; Hypertension in his mother; Kidney cancer in his father; Liver cancer in his father; Lung cancer in his father. There is no history of Bladder Cancer or Prostate cancer.  ROS:   Please see the history of present illness.    All other systems reviewed and are negative.  EKGs/Labs/Other Studies Reviewed:    The following studies were reviewed today:  Remote interrogation reviewed from August 01, 2020 showed no new VT detected or treated.  August 02, 2020 in clinic device interrogation personally reviewed Battery longevity 15 years Lead parameter stable No ATP therapies since May 17, 2020. Less than 1% ventricular pacing No evidence of heart failure on the heart logic index  Recent Labs: 03/28/2020: TSH 1.36 04/19/2020: Hemoglobin 14.6; Platelets 163 05/20/2020: ALT 36; BUN 19; Magnesium 1.4; Potassium 4.1; Sodium 137 07/11/2020: Creatinine, Ser 0.80  Recent Lipid Panel    Component Value Date/Time   CHOL 124 12/21/2019 0919   TRIG 267 (H) 12/21/2019 0919   HDL 36 (L) 12/21/2019 0919   CHOLHDL 3.4 12/21/2019 0919   VLDL 53 (H) 12/21/2019 0919   LDLCALC 35 12/21/2019 0919   LDLDIRECT 55.0 02/10/2019 0903    Physical Exam:    VS:  BP 120/72 (BP Location: Left Arm, Patient Position: Sitting, Cuff Size: Normal)   Pulse (!) 58   Ht 6' (1.829 m)   Wt 201 lb (91.2 kg)   SpO2 93%   BMI 27.26 kg/m     Wt Readings from Last 3 Encounters:  08/02/20 201 lb (91.2 kg)  07/28/20 200 lb (90.7 kg)  07/26/20 199 lb (90.3 kg)     GEN:  Well nourished, well  developed in no acute distress HEENT: Normal NECK: No JVD; No carotid bruits LYMPHATICS: No  lymphadenopathy CARDIAC: RRR, no murmurs, rubs, gallops RESPIRATORY:  Clear to auscultation without rales, wheezing or rhonchi  ABDOMEN: Soft, non-tender, non-distended MUSCULOSKELETAL:  No edema; No deformity  SKIN: Warm and dry NEUROLOGIC:  Alert and oriented x 3 PSYCHIATRIC:  Normal affect   ASSESSMENT:    1. VT (ventricular tachycardia) (HCC)   2. Chronic systolic heart failure ([place])   3. Coronary artery disease of native artery of native heart with stable angina pectoris ([place])   4. Ischemic cardiomyopathy   5. LV (left ventricular) mural thrombus   6. On amiodarone therapy   7. ICD (implantable cardioverter-defibrillator) in place    PLAN:    In order of problems listed above:   1. VT (ventricular tachycardia) ([place]) ICD in situ.  Device interrogation shows device is functioning well.  No recurrent episodes of VT on amiodarone.  2. Chronic systolic heart failure (HCC) NYHA class II.  Warm and dry.  Continue medical therapy with Coreg, lisinopril, spironolactone.  Plan to repeat echo at next appointment with PA/NP.  3. Coronary artery disease of native artery of native heart with stable angina pectoris (HCC) No ischemic symptoms.  Continue Plavix and aspirin.  4. Ischemic cardiomyopathy See above.  5. LV (left ventricular) mural thrombus Resolved  6. On amiodarone therapy Check CMP, TSH, free T4 today.  7. ICD (implantable cardioverter-defibrillator) in place Device check above.  Device functioning well.    Follow-up with NP/PA in 6 months.   Medication Adjustments/Labs and Tests Ordered: Current medicines are reviewed at length with the patient today.  Concerns regarding medicines are outlined above.  Orders Placed This Encounter  Procedures   Comp Met (CMET)   TSH   T4, free    No orders of the defined types were placed in this  encounter.    Signed, Lars Mage, MD, [facility], [facility] 08/02/2020 2:14 PM    Electrophysiology [facility]

## 2020-08-02 NOTE — Patient Instructions (Addendum)
Medication Instructions:  Your physician recommends that you continue on your current medications as directed. Please refer to the Current Medication list given to you today. *If you need a refill on your cardiac medications before your next appointment, please call your pharmacy*  Lab Work: You will get lab work today:  CMP, TSH and free T4 If you have labs (blood work) drawn today and your tests are completely normal, you will receive your results only by: [place] (if you have [place]) OR A paper copy in the mail If you have any lab test that is abnormal or we need to change your treatment, we will call you to review the results.  Testing/Procedures: None ordered.  Follow-Up: At [facility], you and your health needs are our priority.  As part of our continuing mission to provide you with exceptional heart care, we have created designated Provider Care Teams.  These Care Teams include your primary Cardiologist (physician) and Advanced Practice Providers (APPs -  Physician Assistants and Nurse Practitioners) who all work together to provide you with the care you need, when you need it.  Your next appointment:   Your physician wants you to follow-up in: 6 months with [place] [facility].   You will receive a reminder letter in the mail two months in advance. If you don't receive a letter, please call our office to schedule the follow-up appointment.  Remote monitoring is used to monitor your ICD from home. This monitoring reduces the number of office visits required to check your device to one time per year. It allows [place] to keep an eye on the functioning of your device to ensure it is working properly. You are scheduled for a device check from home on 10/31/2020. You may send your transmission at any time that day. If you have a wireless device, the transmission will be sent automatically. After your physician reviews your transmission, you will receive a postcard with your next  transmission date.

## 2020-08-03 LAB — COMPREHENSIVE METABOLIC PANEL
ALT: 37 IU/L (ref 0–44)
AST: 37 IU/L (ref 0–40)
Albumin/Globulin Ratio: 1.5 (ref 1.2–2.2)
Albumin: 4 g/dL (ref 3.7–4.7)
Alkaline Phosphatase: 70 IU/L (ref 44–121)
BUN/Creatinine Ratio: 23 (ref 10–24)
BUN: 22 mg/dL (ref 8–27)
Bilirubin Total: 1 mg/dL (ref 0.0–1.2)
CO2: 22 mmol/L (ref 20–29)
Calcium: 9 mg/dL (ref 8.6–10.2)
Chloride: 103 mmol/L (ref 96–106)
Creatinine, Ser: 0.97 mg/dL (ref 0.76–1.27)
Globulin, Total: 2.6 g/dL (ref 1.5–4.5)
Glucose: 202 mg/dL — ABNORMAL HIGH (ref 65–99)
Potassium: 5.1 mmol/L (ref 3.5–5.2)
Sodium: 141 mmol/L (ref 134–144)
Total Protein: 6.6 g/dL (ref 6.0–8.5)
eGFR: 79 mL/min/{1.73_m2} (ref >59)

## 2020-08-03 LAB — T4, FREE: Free T4: 1.29 ng/dL (ref 0.82–1.77)

## 2020-08-03 LAB — TSH: TSH: 2.47 u[IU]/mL (ref 0.450–4.500)

## 2020-08-07 ENCOUNTER — Ambulatory Visit: Payer: Medicare Other | Admitting: Podiatry

## 2020-08-07 ENCOUNTER — Other Ambulatory Visit: Payer: Self-pay

## 2020-08-07 DIAGNOSIS — M79674 Pain in right toe(s): Secondary | ICD-10-CM | POA: Diagnosis not present

## 2020-08-07 DIAGNOSIS — M79675 Pain in left toe(s): Secondary | ICD-10-CM

## 2020-08-07 DIAGNOSIS — B351 Tinea unguium: Secondary | ICD-10-CM | POA: Diagnosis not present

## 2020-08-07 DIAGNOSIS — E119 Type 2 diabetes mellitus without complications: Secondary | ICD-10-CM | POA: Diagnosis not present

## 2020-08-07 NOTE — Progress Notes (Signed)
  Subjective:  Patient ID: [name] [name], male    DOB: 1940-11-26,  MRN: [id number]  Chief Complaint  Patient presents with   Nail Problem     onychomycosis,diabetic foot and nail care    80 y.o. male presents with the above complaint. History confirmed with patient.  Diabetes is well controlled and his A1c is improving.  Denies history of neuropathy.  Nails are thickened elongated and difficult to cut he is concerned that he will cut himself if he does that at home  Objective:  Physical Exam: warm, good capillary refill, no trophic changes or ulcerative lesions, normal DP and PT pulses, and normal sensory exam. Left Foot: dystrophic yellowed discolored nail plates with subungual debris hallux medial border is the most painful and slightly ingrown without paronychia Right Foot: dystrophic yellowed discolored nail plates with subungual debris  Assessment:   1. Pain due to onychomycosis of toenails of both feet   2. Type 2 diabetes mellitus without complication, without long-term current use of insulin ([place])      Plan:  Patient was evaluated and treated and all questions answered.  Patient educated on diabetes. Discussed proper diabetic foot care and discussed risks and complications of disease. Educated patient in depth on reasons to return to the office immediately should he/she discover anything concerning or new on the feet. All questions answered. Discussed proper shoes as well.   Discussed the etiology and treatment options for the condition in detail with the patient. Educated patient on the topical and oral treatment options for mycotic nails. Recommended debridement of the nails today. Sharp and mechanical debridement performed of all painful and mycotic nails today. Nails debrided in length and thickness using a nail nipper to level of comfort. Discussed treatment options including appropriate shoe gear. Follow up as needed for painful nails.    Return in about 3 months  (around 11/07/2020) for at risk diabetic foot care.

## 2020-08-10 ENCOUNTER — Ambulatory Visit (INDEPENDENT_AMBULATORY_CARE_PROVIDER_SITE_OTHER): Payer: Medicare Other | Admitting: Pharmacist

## 2020-08-10 DIAGNOSIS — E782 Mixed hyperlipidemia: Secondary | ICD-10-CM

## 2020-08-10 DIAGNOSIS — M791 Myalgia, unspecified site: Secondary | ICD-10-CM

## 2020-08-10 DIAGNOSIS — I10 Essential (primary) hypertension: Secondary | ICD-10-CM | POA: Diagnosis not present

## 2020-08-10 DIAGNOSIS — K219 Gastro-esophageal reflux disease without esophagitis: Secondary | ICD-10-CM

## 2020-08-10 DIAGNOSIS — E1165 Type 2 diabetes mellitus with hyperglycemia: Secondary | ICD-10-CM

## 2020-08-10 DIAGNOSIS — I255 Ischemic cardiomyopathy: Secondary | ICD-10-CM

## 2020-08-10 DIAGNOSIS — T466X5A Adverse effect of antihyperlipidemic and antiarteriosclerotic drugs, initial encounter: Secondary | ICD-10-CM

## 2020-08-10 DIAGNOSIS — R197 Diarrhea, unspecified: Secondary | ICD-10-CM

## 2020-08-10 DIAGNOSIS — I25118 Atherosclerotic heart disease of native coronary artery with other forms of angina pectoris: Secondary | ICD-10-CM | POA: Diagnosis not present

## 2020-08-10 NOTE — Chronic Care Management (AMB) (Signed)
Chronic Care Management Pharmacy Note  08/10/2020 Name:  [name] [name] MRN:  [id number] DOB:  Nov 15, 1940  Subjective: [name] [name] is an 80 y.o. year old male who is a primary patient of Vidal Schwalbe, [name], [name].  The CCM team was consulted for assistance with disease management and care coordination needs.    Engaged with patient by telephone for follow up visit in response to provider referral for pharmacy case management and/or care coordination services.   Consent to Services:  The patient was given information about Chronic Care Management services, agreed to services, and gave verbal consent prior to initiation of services.  Please see initial visit note for detailed documentation.   Patient Care Team: [name], [name] as PCP - General (Family Medicine) [name], MD as PCP - Cardiology (Cardiology) Vickie Epley, MD as PCP - Electrophysiology (Cardiology) De Hollingshead, RPH-CPP as Pharmacist (Pharmacist)  Recent office visits: 7/15 - PCP  - A1c at goal, but had been holding Ozempic for 2 weeks. Restarted. Diarrhea worsened by dietary choices  Recent consult visits: 7/13 - [name]- continue amio, carvedilol, lisinopril, spironolactone 7/15 - CT chest showed nodular opacities in bilateral lung bases, unchanged, recommended f/u with lung nodule team 7/20 - EP Quentin Ore - VT controlled w/ amiodarone, continue current regimen  Objective:  Lab Results  Component Value Date   CREATININE 0.97 08/02/2020   CREATININE 0.80 07/11/2020   CREATININE 0.75 05/20/2020    Lab Results  Component Value Date   HGBA1C 6.5 (A) 07/28/2020   Last diabetic Eye exam:  Lab Results  Component Value Date/Time   HMDIABEYEEXA No Retinopathy 09/10/2019 12:00 AM    Last diabetic Foot exam: No results found for: HMDIABFOOTEX      Component Value Date/Time   CHOL 124 12/21/2019 0919   TRIG 267 (H) 12/21/2019 0919   HDL 36 (L) 12/21/2019 0919   CHOLHDL 3.4  12/21/2019 0919   VLDL 53 (H) 12/21/2019 0919   LDLCALC 35 12/21/2019 0919   LDLDIRECT 55.0 02/10/2019 0903    Hepatic Function Latest Ref Rng & Units 08/02/2020 05/20/2020 03/28/2020  Total Protein 6.0 - 8.5 g/dL 6.6 7.3 6.6  Albumin 3.7 - 4.7 g/dL 4.0 4.1 4.0  AST 0 - 40 IU/L 37 40 33  ALT 0 - 44 IU/L 37 36 38  Alk Phosphatase 44 - 121 IU/L 70 52 52  Total Bilirubin 0.0 - 1.2 mg/dL 1.0 1.7(H) 1.2  Bilirubin, Direct 0.0 - 0.2 mg/dL - - -    Lab Results  Component Value Date/Time   TSH 2.470 08/02/2020 02:16 PM   TSH 1.36 03/28/2020 11:36 AM   FREET4 1.29 08/02/2020 02:16 PM   FREET4 1.05 01/08/2017 11:15 AM    CBC Latest Ref Rng & Units 04/19/2020 03/28/2020 02/16/2020  WBC 4.0 - 10.5 K/uL 7.6 7.8 7.4  Hemoglobin 13.0 - 17.0 g/dL 14.6 14.4 14.7  Hematocrit 39.0 - 52.0 % 41.2 40.9 42.1  Platelets 150 - 400 K/uL 163 148.0(L) 158    Lab Results  Component Value Date/Time   VD25OH 33.0 01/08/2017 11:15 AM    Clinical ASCVD: Yes  The ASCVD Risk score Mikey Bussing DC Jr., et al., 2013) failed to calculate for the following reasons:   The 2013 ASCVD risk score is only valid for ages 73 to 78   The patient has a prior MI or stroke diagnosis      Social History   Tobacco Use  Smoking Status Some Days  Types: Pipe  Smokeless Tobacco Former   Quit date: 10/17/2015   BP Readings from Last 3 Encounters:  08/02/20 120/72  07/28/20 (!) 99/50  07/26/20 100/60   Pulse Readings from Last 3 Encounters:  08/02/20 (!) 58  07/28/20 60  07/26/20 62   Wt Readings from Last 3 Encounters:  08/02/20 201 lb (91.2 kg)  07/28/20 200 lb (90.7 kg)  07/26/20 199 lb (90.3 kg)    Assessment: Review of patient past medical history, allergies, medications, health status, including review of consultants reports, laboratory and other test data, was performed as part of comprehensive evaluation and provision of chronic care management services.   SDOH:  (Social Determinants of Health) assessments and  interventions performed:  SDOH Interventions    Flowsheet Row Most Recent Value  SDOH Interventions   Financial Strain Interventions Other (Comment)  [manufacturer assistance]       CCM Care Plan  Allergies  Allergen Reactions   Metformin And Related Diarrhea and Other (See Comments)    Leg cramps, also   Azithromycin Other (See Comments)    Not recommended - no reaction   Other Other (See Comments)    If taking antibiotics for awhile, thrush results   Percocet [Oxycodone-Acetaminophen] Other (See Comments)    Hallucinations   Jardiance [Empagliflozin] Other (See Comments)    Tongue itching/reaction    Medications Reviewed Today     Reviewed by De Hollingshead, RPH-CPP (Pharmacist) on 08/10/20 at [place] List Status: <None>   Medication Order Taking? Sig Documenting Provider Last Dose Status Informant  acetaminophen (TYLENOL) 500 MG tablet [id number] Yes Take 1,000 mg by mouth every 6 (six) hours as needed (pain.). [provider] Taking Active   amiodarone (PACERONE) 200 MG tablet [id number] Yes Take 200 mg by mouth daily. [provider] Taking Active   aspirin EC 81 MG tablet [id number] Yes Take 1 tablet (81 mg total) by mouth daily. Swallow whole. [name], MD Taking Active   carvedilol (COREG) 6.25 MG tablet [id number] Yes Take 6.25 mg by mouth daily. [provider] Taking Active   clopidogrel (PLAVIX) 75 MG tablet [id number] Yes TAKE 1 TABLET(75 MG) BY MOUTH DAILY WITH BREAKFAST [name], [name], MD Taking Active   ezetimibe (ZETIA) 10 MG tablet [id number] Yes TAKE 1 TABLET(10 MG) BY MOUTH DAILY [name], [name], MD Taking Active   famotidine (PEPCID) 20 MG tablet [id number] Yes Take 20 mg by mouth daily. [provider] Taking Active   fluticasone (FLONASE) 50 MCG/ACT nasal spray [id number] Yes Place into both nostrils daily as needed for allergies or rhinitis. [provider] Taking Active   glipiZIDE (GLUCOTROL) 10  MG tablet [id number] Yes Take 1 tablet (10 mg total) by mouth 2 (two) times daily before a meal. Take with largest meals. [name], [name] Taking Active   lisinopril (ZESTRIL) 10 MG tablet [id number] Yes Take 10 mg by mouth daily. [provider] Taking Active   midodrine (PROAMATINE) 5 MG tablet [id number] Yes Take 1 tablet (5 mg total) by mouth 2 (two) times daily as needed. Take when BP does drop less than 110 when standing. Additional dose at 2 pm if still dropping below 110 when standing [name], [name], MD Taking Active   Multiple Vitamin (MULTIVITAMIN WITH MINERALS) TABS tablet [id number] Yes Take 1 tablet by mouth daily with lunch. [provider] Taking Active Self  Multiple Vitamin (MULTIVITAMIN) tablet [id number] Yes Take 1 tablet by mouth daily. [provider] Taking  Active   nitroGLYCERIN (NITROSTAT) 0.4 MG SL tablet [id number]  Place 1 tablet (0.4 mg total) under the tongue every 5 (five) minutes x 3 doses as needed for chest pain. Tommie Raymond, NP  Active   [facility] VERIO test strip [id number] Yes USE TO CHECK BLOOD SUGAR TWICE DAILY [name], [name] Taking Active   pantoprazole (PROTONIX) 40 MG tablet [id number] Yes Take 1 tablet (40 mg total) by mouth daily. [name], MD Taking Active   Polyvinyl Alcohol-Povidone (MURINE TEARS FOR DRY EYES OP) [id number]  Place 1-2 drops into both eyes as needed (for dry eyes).  [provider]  Active Self  psyllium (METAMUCIL) 58.6 % powder [id number] Yes Take 1 packet by mouth at bedtime. [provider] Taking Active Self           Med Note Duffy [name], Deretha Emory Jul 07, 2019  6:13 PM)    rosuvastatin (CRESTOR) 10 MG tablet [id number] Yes Take 10 mg by mouth daily. [provider] Taking Active   Semaglutide, 1 MG/DOSE, (OZEMPIC, 1 MG/DOSE,) 4 MG/3ML SOPN [id number] No ADMINISTER 1 MG UNDER THE SKIN 1 TIME A WEEK  Patient not taking: No sig reported   [name], [name] Not  Taking Active            Med Note Darnelle Maffucci, Alessandria Henken E   Thu Aug 10, 2020 10:15 AM)    sodium chloride (OCEAN) 0.65 % SOLN nasal spray [id number] Yes Place 1 spray into both nostrils as needed for congestion. [provider] Taking Active Self  spironolactone (ALDACTONE) 25 MG tablet [id number] Yes Take 25 mg by mouth daily. [provider] Taking Active   tamsulosin (FLOMAX) 0.4 MG CAPS capsule [id number] Yes TAKE 1 CAPSULE(0.4 MG) BY MOUTH DAILY [name], MD Taking Active   traMADol (ULTRAM) 50 MG tablet [id number] Yes TAKE 1 TABLET BY MOUTH EVERY 12 HOURS AS NEEDED [name], [name] Taking Active             Patient Active Problem List   Diagnosis Date Noted   Lung nodule 07/31/2020   Elevated bilirubin 07/10/2020   Fatigue 03/31/2020   Malfunction of implantable defibrillator ventricular (ICD) lead 02/15/2020   Cardiomyopathy ([place])    Chronic systolic heart failure ([place]) 02/02/2020   Mural thrombus of left ventricle 02/02/2020   ICD (implantable cardioverter-defibrillator) in place 02/02/2020   Ischemic cardiomyopathy    VT (ventricular tachycardia) ([place]) 07/09/2019   NSTEMI (non-ST elevated myocardial infarction) ([place]) 07/07/2019   De Quervain's tenosynovitis, left 05/17/2019   Esophagitis, unspecified without bleeding    Esophageal ulcer 11/20/2018   Acute left flank pain 10/23/2018   Post-nasal drip 10/23/2018   Allergic rhinitis 10/23/2018   Right shoulder pain 07/03/2018   Skin lesion 06/12/2018   Pruritus 06/12/2018   Kidney stones, calcium oxalate 05/28/2018   Atherosclerosis of native coronary artery with stable angina pectoris ([place]) 05/17/2018   GERD (gastroesophageal reflux disease) 02/23/2018   SCC (squamous cell carcinoma) 05/15/2017   Cervical stenosis of spine 05/15/2017   Tobacco abuse 01/26/2017   Cardiac murmur 01/08/2017   Right renal mass 10/31/2016   Abdominal pain 10/31/2016   Fatty liver 10/31/2016   Hematuria  10/31/2016   Acute non-recurrent maxillary sinusitis 10/17/2015   Carotid artery disease ([place]) 10/03/2015   Neck pain 07/28/2015   Atherosclerosis of abdominal aorta ([place]) 07/28/2015   CAD (coronary artery disease) 07/10/2015   Essential hypertension 07/10/2015   Mixed hyperlipidemia 07/10/2015  Controlled type 2 diabetes mellitus with complication, without long-term current use of insulin ([place]) 07/10/2015   History of melanoma 07/10/2015   Chronic low back pain 07/10/2015   BPH (benign prostatic hyperplasia) 07/10/2015   Bilateral hand numbness 07/10/2015   Insomnia 07/10/2015    Immunization History  Administered Date(s) Administered   Influenza, High Dose Seasonal PF 11/01/2015, 10/20/2017, 09/28/2019   Influenza,inj,Quad PF,6+ Mos 10/08/2016   Influenza-Unspecified 10/01/2018   PFIZER(Purple Top)SARS-COV-2 Vaccination 02/04/2019, 02/25/2019, 10/12/2019    Conditions to be addressed/monitored: CAD, HTN, HLD, and DMII  Care Plan : Medication Management  Updates made by De Hollingshead, RPH-CPP since 08/10/2020 12:00 AM     Problem: Diabetes, CAD, HTN, HLD      Long-Range Goal: Disease Progression Prevention   This Visit's Progress: On track  Recent Progress: On track  Priority: High  Note:   Current Barriers:  Unable to independently afford treatment regimen Unable to achieve control of diabetes   Pharmacist Clinical Goal(s):  Over the next 90 days, patient will verbalize ability to afford treatment regimen Over the next 90 days, patient will maintain control of diabetes as evidenced by A1c  through collaboration with PharmD and provider.   Interventions: 1:1 collaboration with [name], [name] regarding development and update of comprehensive plan of care as evidenced by provider attestation and co-signature Inter-disciplinary care team collaboration (see longitudinal plan of care) Comprehensive medication review performed; medication list updated in  electronic medical record  Health Maintenance: Discussed Shingles vaccination. He will talk to his local pharmacy about starting the series.  Discussed CT chest results per provider results. Reviewed lung nodule clinic follow up and the importance of this  Diabetes: Controlled; current treatment: has not started back on Ozempic given control of glucose at last A1c; glipizide 10 mg BID Has had occasional episodes of diarrhea over the past several weeks. Normal bowel movement followed by a second, watery diarrhea occasionally. Can't tie any dietary choices/patterns to it. Happens occasionally in the morning. Previous documentation notes an association with more coffee intake or more fatty foods Has been unable to tolerate metformin, IR and ER, d/t diarrhea.  Did not tolerate Jardiance d/t tongue itching/sores, avoid other SGLT2 for risk of allergic reaction Current glucose readings: fastings 130-150s;  Occasional lower readings, but nothing <70 Reviewed that last A1c did not reflect overall sugar control on this regimen, as he had only been off Ozempic for ~ 2 weeks at that time and the A1c is a 3 month review. Discussed long term concerns with high dose glipizide therapy given age, hypoglycemic risk, and long term concerns with beta cell burnout and lack of efficacy. Discussed secondary prevention CV benefit of Ozempic, and that it is unlikely his diarrhea is related to Ozempic as it has continued even being off the medication. Suggested restarting Ozempic at the lowest dose (0.25 mg, which would be ~ 19 clicks on the 1 mg pen). Patient amenable to this suggestion. PCP recommended restarting Ozempic in last visit note as well. Monitor for hypoglycemia.   Hypertension, Ventricular Ectopy, s/p ICD placement Controlled; current treatment: carvedilol 6.25 mg BID, spironolactone 25 mg QAM, lisinopril 10 mg QAM; follows w/ Dr. Rockey Situ Antiarrhythmic: amiodarone 200 mg daily; follows w/ Dr. Quentin Ore Home BP  readings: this past week: 109/65, 125/75, 124/66, 127/71, 135/71 Given script for midodrine 5 mg by Dr. Rockey Situ to take PRN SBP <110; took once for a reading of 96/53, took midodrine, repeat BP were consistently >110 Continue collaboration with  cardiology and EP as above. Recommended to continue current regimen.  Hyperlipidemia and ASCVD risk reduction: Very well controlled on last lab work, but not checked on current regimen; current treatment: rosuvastatin 10 mg daily, ezetimibe 10 mg daily   Previously tried high intensity rosuvastatin, but patient did not tolerate due to muscle pains. Antiplatelet regimen: clopidogrel 75 mg daily, aspirin 81 mg daily  Previously recommended to continue current regimen at this time along with cardiology collaboration. Recheck lipid panel with next set of labwork.  BPH: Controlled per patient report; current regimen: tamsulosin 0.4 mg daily Recommended to continue current regimen at this time  GERD: Controlled per patient report; current regimen: pantoprazole 40 mg daily, famotidine 40 mg daily  Reports current regimen seems to be controlling upper GI symptoms well. Recommended to continue current regimen at this time  Chronic Pain; Controlled per patient report; current regimen: tramadol 50 mg BID; PRN acetaminophen due to neck pain Continue current regimen at this time along with collaboration with PCP  Patient Goals/Self-Care Activities Over the next 90 days, patient will:  - take medications as prescribed check glucose daily, document, and provide at future appointments check blood pressure daily, document, and provide at future appointments collaborate with provider on medication access solutions  Follow Up Plan: Telephone follow up appointment with care management team member scheduled for: ~ 6 weeks       Medication Assistance:  Ozempic obtained through [employer] medication assistance program.  Enrollment ends 01/13/21  Patient's  preferred pharmacy is:  [id number] [address], [place] [address] Phone: [phone] Fax: [phone]   Follow Up:  Patient agrees to Care Plan and Follow-up.  Plan: Telephone follow up appointment with care management team member scheduled for:  ~ 6 weeks  [name], PharmD, [place], [place] Clinical Pharmacist [employer] at [employer] [phone]

## 2020-08-10 NOTE — Patient Instructions (Addendum)
Mr. Gower,   It was great talking to you today!  I suggest we restart Ozempic at the lowest dose of 0.25 mg (this is 0000000 clicks on the 1 mg pen) weekly. This medication has been shown to reduce the risk of subsequent heart disease in patients with a history of cardiovascular disease. Additionally, high doses of glipizide for a long time may not be the safest long term plan for control of your blood sugars.   Take care!  [name], PharmD [phone]  Visit Information  PATIENT GOALS:  Goals Addressed               This Visit's Progress     Patient Stated     Medication Monitoring (pt-stated)        Patient Goals/Self-Care Activities Over the next 90 days, patient will:  - take medications as prescribed check glucose daily, document, and provide at future appointments check blood pressure daily, document, and provide at future appointments collaborate with provider on medication access solutions         Patient verbalizes understanding of instructions provided today and agrees to view in [place].   Plan: Telephone follow up appointment with care management team member scheduled for:  ~ 6 weeks  [name], PharmD, [place], [employer] Clinical Pharmacist [employer] at [employer] [phone]

## 2020-08-15 ENCOUNTER — Encounter: Payer: Self-pay | Admitting: Gastroenterology

## 2020-08-15 ENCOUNTER — Ambulatory Visit: Payer: Medicare Other | Admitting: Gastroenterology

## 2020-08-15 ENCOUNTER — Other Ambulatory Visit: Payer: Self-pay

## 2020-08-15 VITALS — BP 106/63 | HR 53 | Temp 97.8°F | Ht 72.0 in | Wt 202.0 lb

## 2020-08-15 DIAGNOSIS — R197 Diarrhea, unspecified: Secondary | ICD-10-CM

## 2020-08-15 NOTE — Progress Notes (Signed)
Primary Care Physician: [name], [name]  Primary Gastroenterologist:  Dr. [name]  Chief Complaint  Patient presents with   Abdominal Pain    HPI: [name] [name] is a 80 y.o. male here with a history of diarrhea since having his gallbladder out.  He states this is going on for many years.  He thinks that his diarrhea is related to his medication although he states that some days he has worse diarrhea or and other days he'll have no bowel movements.  The scope almonds usually occur the day after he had diarrhea. He reports that he had a colonoscopy in the past that showed a large polyp that had to be removed with surgery and he is apprehensive about having that problem again. The patient denies any unexplained weight loss fevers chills nausea or vomiting.  The patient has had abnormal liver enzymes in the past but they have come back to normal.  He also had a CT scan that showed him to have abnormal findings in the adrenal gland with kidney cysts and abnormalities in the bases of the lungs.  The patient's past medical history includes heartburn diverticulitis hyperlipidemia hypertension and ischemic cardiomyopathy.  He states that he has a defibrillator and pacemaker in.   Past Medical History:  Diagnosis Date   Aortic insufficiency    a. noted on TTE 2015; b. 06/2019 Echo: AI not visualized.   Arthritis    CAD (coronary artery disease)    a. remote PCI in 1991 and 2005; b. MV 3/15: old inferior MI, no ischemia, LVEF 50%, slight inferior wall hypokniesis; c. 06/2019 NSTEMI/PCI: LM nl, LAD 80p/m (Atherectomy & 4.5x18 Resolute Onyx DES), 68md, D1 75 (PTCA), RI patent stent, LCX nl, RCA 100p, RPAV fills via L->R collats from LCX.   Chicken pox    Colon polyps    4 pre-cancerous    Diverticulitis    DM type 2 (diabetes mellitus, type 2) (HCC)    Family history of adverse reaction to anesthesia    GERD (gastroesophageal reflux disease)    Heart murmur    History of kidney  stones    HOH (hard of hearing)    Hyperlipidemia    Hypertension    Ischemic cardiomyopathy    a. TTE 2015: EF  50-55%, mild global HK; b. 06/2019 Echo: EF 45-50%, Gr1 DD, basal inf AK. Triv MR.   Kidney stones    Melanoma ([facility] 1980   Resected from his back   Mitral regurgitation    a. noted on TTE 2015   Myocardial infarction ([facility]     Current Outpatient Medications  Medication Sig Dispense Refill   acetaminophen (TYLENOL) 500 MG tablet Take 1,000 mg by mouth every 6 (six) hours as needed (pain.).     amiodarone (PACERONE) 200 MG tablet Take 200 mg by mouth daily.     aspirin EC 81 MG tablet Take 1 tablet (81 mg total) by mouth daily. Swallow whole. 90 tablet 3   carvedilol (COREG) 6.25 MG tablet Take 6.25 mg by mouth daily.     clopidogrel (PLAVIX) 75 MG tablet TAKE 1 TABLET(75 MG) BY MOUTH DAILY WITH BREAKFAST 60 tablet 3   ezetimibe (ZETIA) 10 MG tablet TAKE 1 TABLET(10 MG) BY MOUTH DAILY 90 tablet 1   famotidine (PEPCID) 20 MG tablet Take 20 mg by mouth daily.     fluticasone (FLONASE) 50 MCG/ACT nasal spray Place into both nostrils daily as needed for allergies or rhinitis.  glipiZIDE (GLUCOTROL) 10 MG tablet Take 1 tablet (10 mg total) by mouth 2 (two) times daily before a meal. Take with largest meals. 120 tablet 3   lisinopril (ZESTRIL) 10 MG tablet Take 10 mg by mouth daily.     midodrine (PROAMATINE) 5 MG tablet Take 1 tablet (5 mg total) by mouth 2 (two) times daily as needed. Take when BP does drop less than 110 when standing. Additional dose at 2 pm if still dropping below 110 when standing 90 tablet 3   Multiple Vitamin (MULTIVITAMIN WITH MINERALS) TABS tablet Take 1 tablet by mouth daily with lunch.     Multiple Vitamin (MULTIVITAMIN) tablet Take 1 tablet by mouth daily.     nitroGLYCERIN (NITROSTAT) 0.4 MG SL tablet Place 1 tablet (0.4 mg total) under the tongue every 5 (five) minutes x 3 doses as needed for chest pain. 25 tablet 0   ONETOUCH VERIO test strip USE TO  CHECK BLOOD SUGAR TWICE DAILY 100 strip 1   pantoprazole (PROTONIX) 40 MG tablet Take 1 tablet (40 mg total) by mouth daily. 30 tablet 6   Polyvinyl Alcohol-Povidone (MURINE TEARS FOR DRY EYES OP) Place 1-2 drops into both eyes as needed (for dry eyes).      psyllium (METAMUCIL) 58.6 % powder Take 1 packet by mouth at bedtime.     rosuvastatin (CRESTOR) 10 MG tablet Take 10 mg by mouth daily.     Semaglutide, 1 MG/DOSE, (OZEMPIC, 1 MG/DOSE,) 4 MG/3ML SOPN ADMINISTER 1 MG UNDER THE SKIN 1 TIME A WEEK 3 mL 1   sodium chloride (OCEAN) 0.65 % SOLN nasal spray Place 1 spray into both nostrils as needed for congestion.     spironolactone (ALDACTONE) 25 MG tablet Take 25 mg by mouth daily.     tamsulosin (FLOMAX) 0.4 MG CAPS capsule TAKE 1 CAPSULE(0.4 MG) BY MOUTH DAILY 90 capsule 3   traMADol (ULTRAM) 50 MG tablet TAKE 1 TABLET BY MOUTH EVERY 12 HOURS AS NEEDED 60 tablet 1   No current facility-administered medications for this visit.    Allergies as of 08/15/2020 - Review Complete 08/15/2020  Allergen Reaction Noted   Metformin and related Diarrhea and Other (See Comments) 07/13/2018   Azithromycin Other (See Comments) 07/07/2015   Other Other (See Comments) 07/07/2019   Percocet [oxycodone-acetaminophen] Other (See Comments) 07/07/2015   Jardiance [empagliflozin] Other (See Comments) 08/03/2019    ROS:  General: Negative for anorexia, weight loss, fever, chills, fatigue, weakness. ENT: Negative for hoarseness, difficulty swallowing , nasal congestion. CV: Negative for chest pain, angina, palpitations, dyspnea on exertion, peripheral edema.  Respiratory: Negative for dyspnea at rest, dyspnea on exertion, cough, sputum, wheezing.  GI: See history of present illness. GU:  Negative for dysuria, hematuria, urinary incontinence, urinary frequency, nocturnal urination.  Endo: Negative for unusual weight change.    Physical Examination:   BP 106/63 (BP Location: Left Arm, Patient Position:  Sitting, Cuff Size: Normal)   Pulse (!) 53   Temp 97.8 F (36.6 C) (Oral)   Ht 6' (1.829 m)   Wt 202 lb (91.6 kg)   BMI 27.40 kg/m   General: Well-nourished, well-developed in no acute distress.  Eyes: No icterus. Conjunctivae pink. Neuro: Alert and oriented x 3.  Grossly intact. Skin: Warm and dry, no jaundice.   Psych: Alert and cooperative, normal mood and affect.  Labs:    Imaging Studies: CT Chest Wo Contrast  Result Date: 07/31/2020 CLINICAL DATA:  Lung nodule, < 20m, high cancer risk new  lung nodule EXAM: CT CHEST WITHOUT CONTRAST TECHNIQUE: Multidetector CT imaging of the chest was performed following the standard protocol without IV contrast. COMPARISON:  None. FINDINGS: Cardiovascular: Normal cardiac size. Small pericardial effusion. There is a single lead AICD which terminates in the right ventricle. Extensive coronary artery calcifications. Atherosclerotic calcifications of the aorta. Mediastinum/Nodes: No mediastinal, hilar, or axillary lymphadenopathy. Lungs/Pleura: There are unchanged nodular opacities in the lung bases bilaterally, with adjacent atelectasis and or scarring. Largest nodule is in the subpleural left lower lobe measuring 1.0 cm (series 3, image 132). The airways are patent with minimal respiratory secretions. There is no pleural effusion or pneumothorax. Upper Abdomen: Prior cholecystectomy. Bilateral renal cysts, large on the right, as seen on prior abdomen pelvis CT. Right adrenal nodule measures 2.0 cm with Hounsfield units measuring -5, consistent with lipid rich adenoma. Mild left adrenal thickening without discrete nodule. A few colonic diverticuli are visualized. Musculoskeletal: There are no suspicious lytic or blastic lesions. There is no acute osseous abnormality. IMPRESSION: Unchanged nodular opacities in the bilateral lung bases, favored to represent an infectious/inflammatory process, possibly aspiration. Largest nodular opacity measures 1.0 cm in the  left lung base. Non-contrast chest CT in 3 months is recommended to ensure stability and/or resolution. If the nodules are stable at time of repeat CT, then future CT at 18-24 months (from today's scan) is considered optional for low-risk patients, but is recommended for high-risk patients. This recommendation follows the consensus statement: Guidelines for Management of Incidental Pulmonary Nodules Detected on CT Images: From the Fleischner Society 2017; Radiology 2017; 284:228-243. Extensive coronary artery calcifications. Aortic Atherosclerosis (ICD10-I70.0). Electronically Signed   By: Maurine Simmering   On: 07/31/2020 10:37   CUP PACEART REMOTE DEVICE CHECK  Result Date: 08/01/2020 Scheduled remote reviewed. Normal device function.  Next remote 91 days- JBox, RN/CVRS   Assessment and Plan:   [name] [name] is a 80 y.o. y/o male who comes in today with a history of diarrhea.  The patient reports that his CT scan showed multiple abnormalities and he is more concerned about that that he is the history of colon polyps. The patient's diarrhea had gotten worse since having his gallbladder out what he is on multiple medications and cholestyramine can interfere with his medications so that will not be started at the present time.  He also has dairy intake and has been told to try and avoid dairy intake for one week and see if his symptoms get better.  He is also been told to take Imodium on a regular basis to help solidify his stools.  He would like to hold off on any colonoscopy at the present time until he has his other abnormal CT findings verified in investigated.  The patient will contact me when he decides and if he decides to proceed with a colonoscopy.  The patient has been explained the plan and agrees with it.     [name], MD. [name]    Note: This dictation was prepared with Dragon dictation along with smaller phrase technology. Any transcriptional errors that result from this process are  unintentional.

## 2020-08-23 ENCOUNTER — Other Ambulatory Visit: Payer: Self-pay

## 2020-08-23 ENCOUNTER — Ambulatory Visit
Admission: RE | Admit: 2020-08-23 | Discharge: 2020-08-23 | Disposition: A | Discharge status: Home / Self Care | Payer: Medicare Other | Source: Ambulatory Visit | Attending: Urology | Admitting: Urology

## 2020-08-23 DIAGNOSIS — N200 Calculus of kidney: Secondary | ICD-10-CM | POA: Diagnosis not present

## 2020-08-23 DIAGNOSIS — N281 Cyst of kidney, acquired: Secondary | ICD-10-CM | POA: Diagnosis not present

## 2020-08-23 DIAGNOSIS — N138 Other obstructive and reflux uropathy: Secondary | ICD-10-CM | POA: Insufficient documentation

## 2020-08-23 DIAGNOSIS — N401 Enlarged prostate with lower urinary tract symptoms: Secondary | ICD-10-CM | POA: Insufficient documentation

## 2020-08-24 NOTE — Progress Notes (Signed)
Remote ICD transmission.   

## 2020-08-28 ENCOUNTER — Ambulatory Visit: Payer: Medicare Other | Admitting: Pharmacist

## 2020-08-28 DIAGNOSIS — E782 Mixed hyperlipidemia: Secondary | ICD-10-CM

## 2020-08-28 DIAGNOSIS — E1165 Type 2 diabetes mellitus with hyperglycemia: Secondary | ICD-10-CM

## 2020-08-28 DIAGNOSIS — I10 Essential (primary) hypertension: Secondary | ICD-10-CM

## 2020-08-28 DIAGNOSIS — I255 Ischemic cardiomyopathy: Secondary | ICD-10-CM

## 2020-08-28 DIAGNOSIS — I25118 Atherosclerotic heart disease of native coronary artery with other forms of angina pectoris: Secondary | ICD-10-CM

## 2020-08-28 NOTE — Chronic Care Management (AMB) (Signed)
Chronic Care Management Pharmacy Note  08/28/2020 Name:  [name] [name] MRN:  [id number] DOB:  04/10/1940  Subjective: [name] [name] is an 80 y.o. year old male who is a primary patient of Jason Coop, Lyn Records, [name].  The CCM team was consulted for assistance with disease management and care coordination needs.    Engaged with patient by telephone for follow up visit in response to provider referral for pharmacy case management and/or care coordination services.   Consent to Services:  The patient was given information about Chronic Care Management services, agreed to services, and gave verbal consent prior to initiation of services.  Please see initial visit note for detailed documentation.   Patient Care Team: Allegra Grana, [name] as PCP - General (Family Medicine) Antonieta Iba, MD as PCP - Cardiology (Cardiology) Lanier Prude, MD as PCP - Electrophysiology (Cardiology) Lourena Simmonds, RPH-CPP as Pharmacist (Pharmacist)   Objective:  Lab Results  Component Value Date   CREATININE 0.97 08/02/2020   CREATININE 0.80 07/11/2020   CREATININE 0.75 05/20/2020    Lab Results  Component Value Date   HGBA1C 6.5 (A) 07/28/2020   Last diabetic Eye exam:  Lab Results  Component Value Date/Time   HMDIABEYEEXA No Retinopathy 09/10/2019 12:00 AM    Last diabetic Foot exam: No results found for: HMDIABFOOTEX      Component Value Date/Time   CHOL 124 12/21/2019 0919   TRIG 267 (H) 12/21/2019 0919   HDL 36 (L) 12/21/2019 0919   CHOLHDL 3.4 12/21/2019 0919   VLDL 53 (H) 12/21/2019 0919   LDLCALC 35 12/21/2019 0919   LDLDIRECT 55.0 02/10/2019 0903    Hepatic Function Latest Ref Rng & Units 08/02/2020 05/20/2020 03/28/2020  Total Protein 6.0 - 8.5 g/dL 6.6 7.3 6.6  Albumin 3.7 - 4.7 g/dL 4.0 4.1 4.0  AST 0 - 40 IU/L 37 40 33  ALT 0 - 44 IU/L 37 36 38  Alk Phosphatase 44 - 121 IU/L 70 52 52  Total Bilirubin 0.0 - 1.2 mg/dL 1.0 7.8(G) 1.2  Bilirubin, Direct  0.0 - 0.2 mg/dL - - -    Lab Results  Component Value Date/Time   TSH 2.470 08/02/2020 02:16 PM   TSH 1.36 03/28/2020 11:36 AM   FREET4 1.29 08/02/2020 02:16 PM   FREET4 1.05 01/08/2017 11:15 AM    CBC Latest Ref Rng & Units 04/19/2020 03/28/2020 02/16/2020  WBC 4.0 - 10.5 K/uL 7.6 7.8 7.4  Hemoglobin 13.0 - 17.0 g/dL 95.6 21.3 08.6  Hematocrit 39.0 - 52.0 % 41.2 40.9 42.1  Platelets 150 - 400 K/uL 163 148.0(L) 158    Lab Results  Component Value Date/Time   VD25OH 33.0 01/08/2017 11:15 AM    Clinical ASCVD: Yes  The ASCVD Risk score Denman George DC Jr., et al., 2013) failed to calculate for the following reasons:   The 2013 ASCVD risk score is only valid for ages 47 to 103   The patient has a prior MI or stroke diagnosis      Social History   Tobacco Use  Smoking Status Some Days   Types: Pipe  Smokeless Tobacco Former   Quit date: 10/17/2015   BP Readings from Last 3 Encounters:  08/15/20 106/63  08/02/20 120/72  07/28/20 (!) 99/50   Pulse Readings from Last 3 Encounters:  08/15/20 (!) 53  08/02/20 (!) 58  07/28/20 60   Wt Readings from Last 3 Encounters:  08/15/20 202 lb (91.6 kg)  08/02/20 201 lb (91.2  kg)  07/28/20 200 lb (90.7 kg)    Assessment: Review of patient past medical history, allergies, medications, health status, including review of consultants reports, laboratory and other test data, was performed as part of comprehensive evaluation and provision of chronic care management services.   SDOH:  (Social Determinants of Health) assessments and interventions performed:  SDOH Interventions    Flowsheet Row Most Recent Value  SDOH Interventions   Financial Strain Interventions Other (Comment)  [manufacturer assistance]       CCM Care Plan  Allergies  Allergen Reactions   Metformin And Related Diarrhea and Other (See Comments)    Leg cramps, also   Azithromycin Other (See Comments)    Not recommended - no reaction   Other Other (See Comments)    If  taking antibiotics for awhile, thrush results   Percocet [Oxycodone-Acetaminophen] Other (See Comments)    Hallucinations   Jardiance [Empagliflozin] Other (See Comments)    Tongue itching/reaction    Medications Reviewed Today     Reviewed by [name], MD (Physician) on 08/15/20 at 1735  Med List Status: <None>   Medication Order Taking? Sig Documenting Provider Last Dose Status Informant  acetaminophen (TYLENOL) 500 MG tablet [id number] Yes Take 1,000 mg by mouth every 6 (six) hours as needed (pain.). [provider] Taking Active   amiodarone (PACERONE) 200 MG tablet [id number] Yes Take 200 mg by mouth daily. [provider] Taking Active   aspirin EC 81 MG tablet [id number] Yes Take 1 tablet (81 mg total) by mouth daily. Swallow whole. [name], MD Taking Active   carvedilol (COREG) 6.25 MG tablet [id number] Yes Take 6.25 mg by mouth daily. [provider] Taking Active   clopidogrel (PLAVIX) 75 MG tablet [id number] Yes TAKE 1 TABLET(75 MG) BY MOUTH DAILY WITH BREAKFAST [name], [name], MD Taking Active   ezetimibe (ZETIA) 10 MG tablet [id number] Yes TAKE 1 TABLET(10 MG) BY MOUTH DAILY [name], [name], MD Taking Active   famotidine (PEPCID) 20 MG tablet [id number] Yes Take 20 mg by mouth daily. [provider] Taking Active   fluticasone (FLONASE) 50 MCG/ACT nasal spray [id number] Yes Place into both nostrils daily as needed for allergies or rhinitis. [provider] Taking Active   glipiZIDE (GLUCOTROL) 10 MG tablet [id number] Yes Take 1 tablet (10 mg total) by mouth 2 (two) times daily before a meal. Take with largest meals. [name], [name] Taking Active   lisinopril (ZESTRIL) 10 MG tablet [id number] Yes Take 10 mg by mouth daily. [provider] Taking Active   midodrine (PROAMATINE) 5 MG tablet [id number] Yes Take 1 tablet (5 mg total) by mouth 2 (two) times daily as needed. Take when BP does drop less than 110 when  standing. Additional dose at 2 pm if still dropping below 110 when standing [name], [name], MD Taking Active   Multiple Vitamin (MULTIVITAMIN WITH MINERALS) TABS tablet [id number] Yes Take 1 tablet by mouth daily with lunch. [provider] Taking Active Self  Multiple Vitamin (MULTIVITAMIN) tablet [id number] Yes Take 1 tablet by mouth daily. [provider] Taking Active   nitroGLYCERIN (NITROSTAT) 0.4 MG SL tablet [id number] Yes Place 1 tablet (0.4 mg total) under the tongue every 5 (five) minutes x 3 doses as needed for chest pain. Tommie Raymond, NP Taking Active   [facility] VERIO test strip [id number] Yes USE TO CHECK BLOOD SUGAR TWICE DAILY [name], [name] Taking Active   pantoprazole (PROTONIX) 40 MG tablet [id number]  Yes Take 1 tablet (40 mg total) by mouth daily. Midge Minium, MD Taking Active   Polyvinyl Alcohol-Povidone (MURINE TEARS FOR DRY EYES OP) [id number] Yes Place 1-2 drops into both eyes as needed (for dry eyes).  [provider] Taking Active Self  psyllium (METAMUCIL) 58.6 % powder [id number] Yes Take 1 packet by mouth at bedtime. [provider] Taking Active Self           Med Note Antony Madura, Melody Comas Jul 07, 2019  6:13 PM)    rosuvastatin (CRESTOR) 10 MG tablet [id number] Yes Take 10 mg by mouth daily. [provider] Taking Active   Semaglutide, 1 MG/DOSE, (OZEMPIC, 1 MG/DOSE,) 4 MG/3ML SOPN [id number] Yes ADMINISTER 1 MG UNDER THE SKIN 1 TIME A WEEK [name], Lyn Records, [name] Taking Active            Med Note Feliz Beam, Amyrie Illingworth E   Thu Aug 10, 2020 10:15 AM)    sodium chloride (OCEAN) 0.65 % SOLN nasal spray [id number] Yes Place 1 spray into both nostrils as needed for congestion. [provider] Taking Active Self  spironolactone (ALDACTONE) 25 MG tablet [id number] Yes Take 25 mg by mouth daily. [provider] Taking Active   tamsulosin (FLOMAX) 0.4 MG CAPS capsule [id number] Yes TAKE 1 CAPSULE(0.4 MG) BY MOUTH  DAILY Sondra Come, MD Taking Active   traMADol (ULTRAM) 50 MG tablet [id number] Yes TAKE 1 TABLET BY MOUTH EVERY 12 HOURS AS NEEDED Allegra Grana, [name] Taking Active             Patient Active Problem List   Diagnosis Date Noted   Lung nodule 07/31/2020   Elevated bilirubin 07/10/2020   Fatigue 03/31/2020   Malfunction of implantable defibrillator ventricular (ICD) lead 02/15/2020   Cardiomyopathy (HCC)    Chronic systolic heart failure (HCC) 02/02/2020   Mural thrombus of left ventricle 02/02/2020   ICD (implantable cardioverter-defibrillator) in place 02/02/2020   Ischemic cardiomyopathy    VT (ventricular tachycardia) (HCC) 07/09/2019   NSTEMI (non-ST elevated myocardial infarction) (HCC) 07/07/2019   De Quervain's tenosynovitis, left 05/17/2019   Esophagitis, unspecified without bleeding    Esophageal ulcer 11/20/2018   Acute left flank pain 10/23/2018   Post-nasal drip 10/23/2018   Allergic rhinitis 10/23/2018   Right shoulder pain 07/03/2018   Skin lesion 06/12/2018   Pruritus 06/12/2018   Kidney stones, calcium oxalate 05/28/2018   Atherosclerosis of native coronary artery with stable angina pectoris (HCC) 05/17/2018   GERD (gastroesophageal reflux disease) 02/23/2018   SCC (squamous cell carcinoma) 05/15/2017   Cervical stenosis of spine 05/15/2017   Tobacco abuse 01/26/2017   Cardiac murmur 01/08/2017   Right renal mass 10/31/2016   Abdominal pain 10/31/2016   Fatty liver 10/31/2016   Hematuria 10/31/2016   Acute non-recurrent maxillary sinusitis 10/17/2015   Carotid artery disease (HCC) 10/03/2015   Neck pain 07/28/2015   Atherosclerosis of abdominal aorta (HCC) 07/28/2015   CAD (coronary artery disease) 07/10/2015   Essential hypertension 07/10/2015   Mixed hyperlipidemia 07/10/2015   Controlled type 2 diabetes mellitus with complication, without long-term current use of insulin (HCC) 07/10/2015   History of melanoma 07/10/2015   Chronic low back  pain 07/10/2015   BPH (benign prostatic hyperplasia) 07/10/2015   Bilateral hand numbness 07/10/2015   Insomnia 07/10/2015    Immunization History  Administered Date(s) Administered   Influenza, High Dose Seasonal PF 11/01/2015, 10/20/2017, 09/28/2019   Influenza,inj,Quad PF,6+ Mos 10/08/2016   Influenza-Unspecified 10/01/2018  PFIZER(Purple Top)SARS-COV-2 Vaccination 02/04/2019, 02/25/2019, 10/12/2019    Conditions to be addressed/monitored: HTN, HLD, and DMII  Care Plan : Medication Management  Updates made by De Hollingshead, RPH-CPP since 08/28/2020 12:00 AM     Problem: Diabetes, CAD, HTN, HLD      Long-Range Goal: Disease Progression Prevention   This Visit's Progress: On track  Recent Progress: On track  Priority: High  Note:   Current Barriers:  Unable to independently afford treatment regimen Unable to achieve control of diabetes   Pharmacist Clinical Goal(s):  Over the next 90 days, patient will verbalize ability to afford treatment regimen Over the next 90 days, patient will maintain control of diabetes as evidenced by A1c  through collaboration with PharmD and provider.   Interventions: 1:1 collaboration with [name], [name] regarding development and update of comprehensive plan of care as evidenced by provider attestation and co-signature Inter-disciplinary care team collaboration (see longitudinal plan of care) Comprehensive medication review performed; medication list updated in electronic medical record  Health Maintenance: Previously discussed Shingles vaccination. He will talk to his local pharmacy about starting the series.   Diabetes: Controlled; current treatment: Ozempic 0.5 mg weekly ; glipizide 10 mg BID Reports he waited a few weeks before starting Ozempic back, and did so about a week ago. Notes sugars have not improved. Denies any GI upset, discomfort, diarrhea. Has been unable to tolerate metformin, IR and ER, d/t diarrhea.  Did  not tolerate Jardiance d/t tongue itching/sores, avoid other SGLT2 for risk of allergic reaction Current glucose readings: fastings ~ 1520s Reviewed that due to weekly dosing, it can take 3-4 weeks for full benefit of Ozempic. Recommended to continue current regimen at this time.  Hypertension, Ventricular Ectopy, s/p ICD placement Controlled per last office visit; current treatment: carvedilol 6.25 mg BID, spironolactone 25 mg QAM, lisinopril 10 mg QAM; follows w/ Dr. Rockey Situ Antiarrhythmic: amiodarone 200 mg daily; follows w/ Dr. Quentin Ore Midodrine 5 mg by Dr. Rockey Situ to take PRN SBP <110;  Previously recommended to continue current regimen along with collaboration with cardiology and EP as above  Hyperlipidemia and ASCVD risk reduction: Very well controlled on last lab work, but not checked on current regimen; current treatment: rosuvastatin 10 mg daily, ezetimibe 10 mg daily   Previously tried high intensity rosuvastatin, but patient did not tolerate due to muscle pains. Antiplatelet regimen: clopidogrel 75 mg daily, aspirin 81 mg daily  Previously recommended to continue current regimen at this time along with cardiology collaboration. Recheck lipid panel with next set of labwork.  BPH: Controlled per patient report; current regimen: tamsulosin 0.4 mg daily Previously recommended to continue current regimen at this time  GERD: Controlled per patient report; current regimen: pantoprazole 40 mg daily, famotidine 40 mg daily  Previously recommended to continue current regimen at this time  Chronic Pain; Controlled per patient report; current regimen: tramadol 50 mg BID; PRN acetaminophen due to neck pain Previously recommended to continue current regimen at this time along with collaboration with PCP  Patient Goals/Self-Care Activities Over the next 90 days, patient will:  - take medications as prescribed check glucose daily, document, and provide at future appointments check blood  pressure daily, document, and provide at future appointments collaborate with provider on medication access solutions  Follow Up Plan: Telephone follow up appointment with care management team member scheduled for: ~ 4 weeks as previously scheduled      Medication Assistance:  Ozempic obtained through [employer] medication assistance program.  Enrollment  ends 12/13/20  Patient's preferred pharmacy is:  [id number] [address], [place] [address] Phone: [phone] Fax: [phone]    Follow Up:  Patient agrees to Care Plan and Follow-up.  Plan: Telephone follow up appointment with care management team member scheduled for:  4 weeks as previously scheduled  [name], PharmD, [place], [place] Clinical Pharmacist [employer] [facility] [employer] [phone]

## 2020-08-28 NOTE — Patient Instructions (Signed)
Visit Information  PATIENT GOALS:  Goals Addressed               This Visit's Progress     Patient Stated     Medication Monitoring (pt-stated)        Patient Goals/Self-Care Activities Over the next 90 days, patient will:  - take medications as prescribed check glucose daily, document, and provide at future appointments check blood pressure daily, document, and provide at future appointments collaborate with provider on medication access solutions        Patient verbalizes understanding of instructions provided today and agrees to view in [place].    Plan: Telephone follow up appointment with care management team member scheduled for:  4 weeks as previously scheduled  [name], PharmD, [place], [place] Clinical Pharmacist [employer] [facility] [employer] [phone]

## 2020-08-30 ENCOUNTER — Encounter (HOSPITAL_COMMUNITY): Payer: Self-pay | Admitting: Cardiology

## 2020-08-30 ENCOUNTER — Ambulatory Visit: Payer: Medicare Other | Admitting: Urology

## 2020-08-30 ENCOUNTER — Other Ambulatory Visit: Payer: Self-pay

## 2020-08-30 VITALS — BP 118/77 | HR 61 | Ht 73.0 in | Wt 203.0 lb

## 2020-08-30 DIAGNOSIS — N200 Calculus of kidney: Secondary | ICD-10-CM | POA: Diagnosis not present

## 2020-08-30 DIAGNOSIS — E278 Other specified disorders of adrenal gland: Secondary | ICD-10-CM

## 2020-08-30 DIAGNOSIS — N401 Enlarged prostate with lower urinary tract symptoms: Secondary | ICD-10-CM

## 2020-08-30 DIAGNOSIS — N281 Cyst of kidney, acquired: Secondary | ICD-10-CM

## 2020-08-30 DIAGNOSIS — N138 Other obstructive and reflux uropathy: Secondary | ICD-10-CM | POA: Diagnosis not present

## 2020-08-30 LAB — BLADDER SCAN AMB NON-IMAGING

## 2020-08-30 MED ORDER — TAMSULOSIN HCL 0.4 MG PO CAPS
ORAL_CAPSULE | ORAL | 3 refills | Status: DC
Start: 2020-08-30 — End: 2020-10-23

## 2020-08-30 NOTE — Patient Instructions (Signed)
Radiology will call you in December to set up time and date for CT scan

## 2020-08-30 NOTE — Progress Notes (Signed)
   08/30/2020 9:02 AM   [name] [name] 01-06-1941 [id number]  Reason for visit: Follow up nephrolithiasis, BPH, renal cyst  HPI: I saw [name] for the above issues.  He has a long history of BPH and is on Flomax.  His urinary symptoms are stable, and he occasionally feels like he has to urinate a second time immediately after his first void to empty completely.  He is minimally bothered by this.  He denies any gross hematuria or UTIs.  PVR is mildly elevated today at 185 mL which is around his baseline.  Continue Flomax, we discussed outlet procedures briefly, and he is not interested at this time.  In terms of his renal cysts, we had been following a ~1.5 centimeters right Bosniak 42F cyst that had been stable for many years on MRI.  I personally viewed and interpreted the most recent CT dated 07/11/2020 performed for abdominal pain that shows stable renal cysts with no evidence of enhancement..  I also reviewed the most recent renal ultrasound that shows no solid renal mass.  He had a new 2 cm indeterminate right adrenal nodule on the most recent CT from June 2022, and I recommended 39-monthfollow-up CT to confirm stability.  We discussed endocrine evaluation as well, and he would like to hold off at this time.  Regarding nephrolithiasis, he underwent ureteroscopy with me in December 2019 on the right side.  He denies any stones over the last year.  On CT he has significant left-sided stone burden, but no hydronephrosis.  We discussed options including surveillance or ureteroscopy, and he would like to continue with surveillance.  -Flomax refilled -Call with repeat CT in 6 months regarding right adrenal nodule -RTC 1 year PVR, symptom check.  He would like to continue yearly renal ultrasounds regarding his renal cysts   BB[place]ley [place] MConcepc[address] [place] [phone]

## 2020-09-09 ENCOUNTER — Other Ambulatory Visit: Payer: Self-pay | Admitting: Family

## 2020-09-09 DIAGNOSIS — M549 Dorsalgia, unspecified: Secondary | ICD-10-CM

## 2020-09-09 DIAGNOSIS — G8929 Other chronic pain: Secondary | ICD-10-CM

## 2020-09-12 NOTE — Telephone Encounter (Signed)
Last refill 6/22 and last OV 07/25/20 okay to fill?

## 2020-09-15 DIAGNOSIS — H40003 Preglaucoma, unspecified, bilateral: Secondary | ICD-10-CM | POA: Diagnosis not present

## 2020-09-15 LAB — HM DIABETES EYE EXAM

## 2020-09-23 IMAGING — CT CT ABD-PELV W/O CM
2 of 7 series · 14 of 46 positions shown, 18 images · non-contrast
Comparison: MRI abdomen 08/06/2018, CT 09/24/2016

CLINICAL DATA: RIGHT flank pain for 4 weeks. Microhematuria

EXAM:
CT ABDOMEN AND PELVIS WITHOUT CONTRAST
TECHNIQUE: Multidetector CT imaging of the abdomen and pelvis was performed
following the standard protocol without IV contrast.

[Series 2: renal stone 5.00 · axial · 0.83mm/px · z∈[-1594,-1184]mm · 11 of 97 slices shown, 15 images]
[im 10/97  soft-tissue]
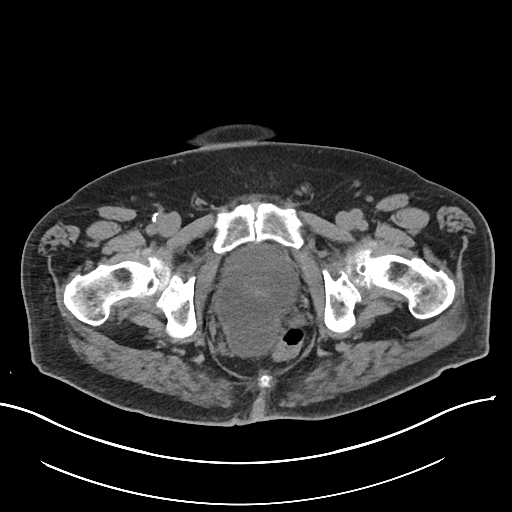
[im 10/97  bone]
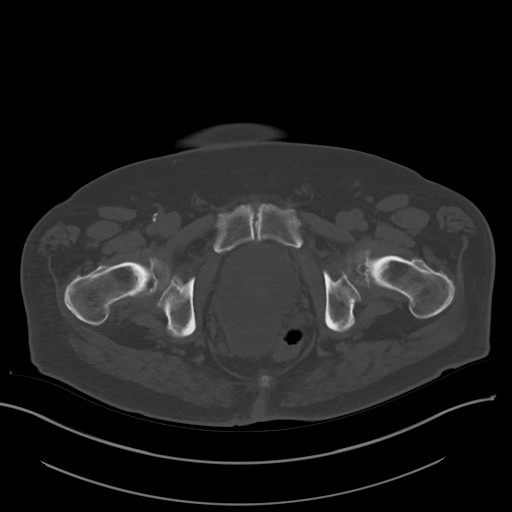
[im 20/97  soft-tissue]
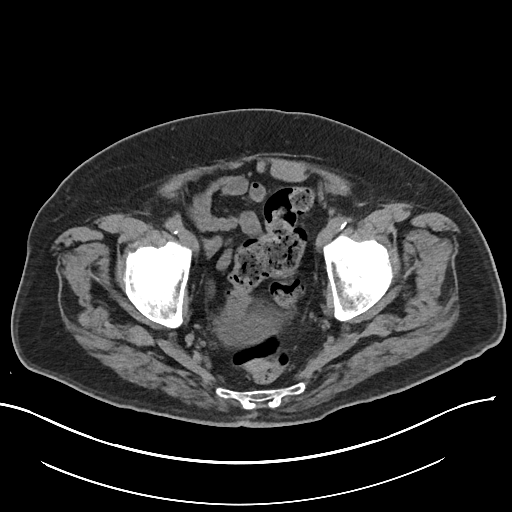
[im 29/97  soft-tissue]
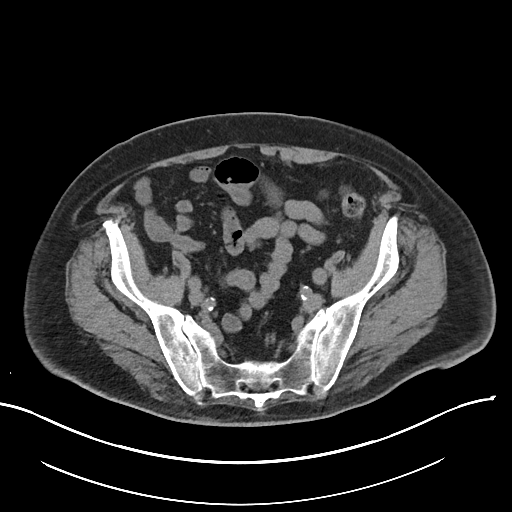
[im 39/97  soft-tissue]
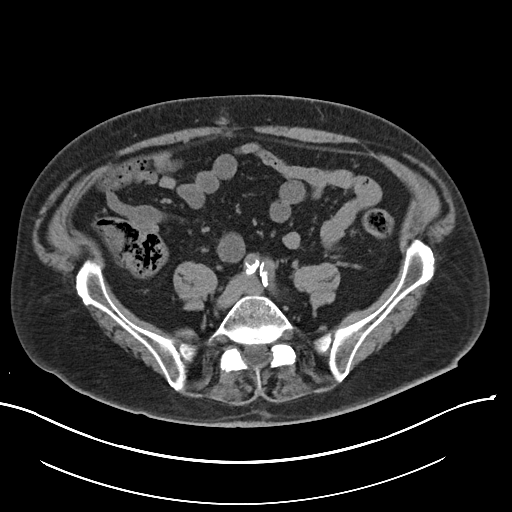
[im 49/97  soft-tissue]
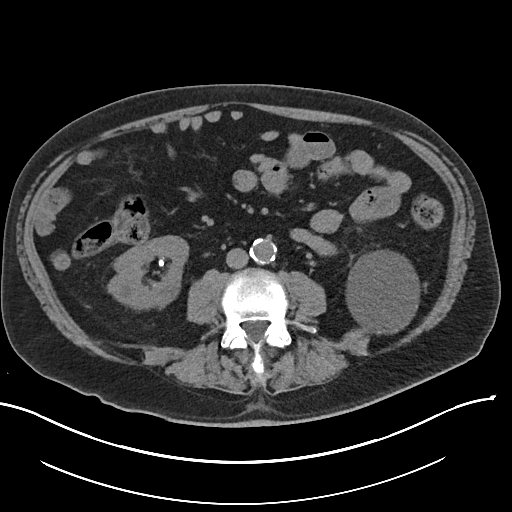
[im 58/97  soft-tissue]
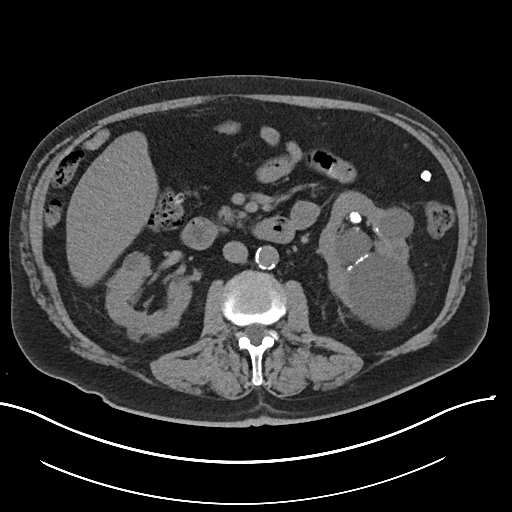
[im 68/97  soft-tissue]
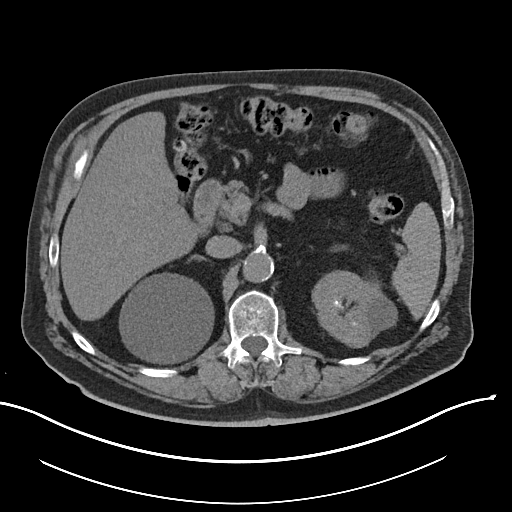
[im 77/97  soft-tissue]
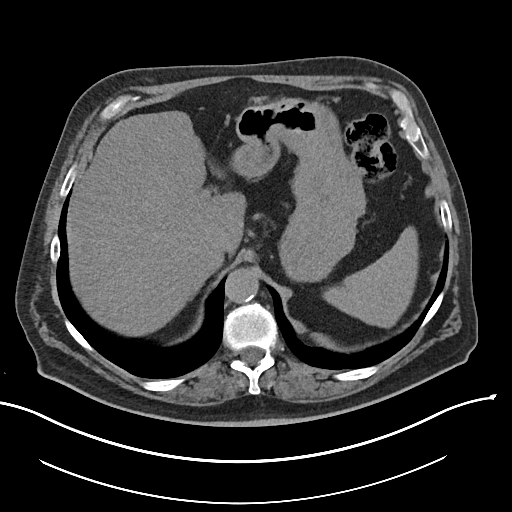
[im 77/97  lung]
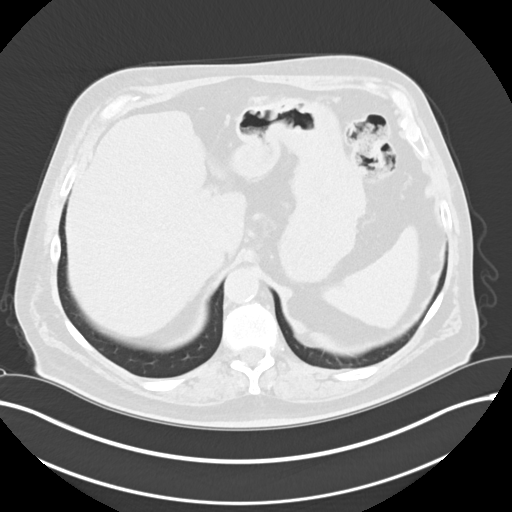
[im 82/97  lung]
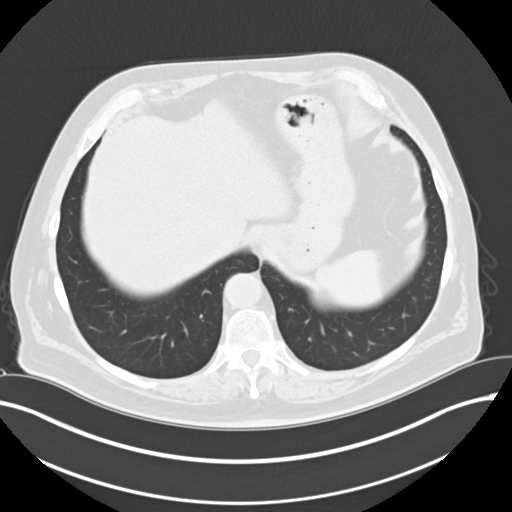
[im 87/97  soft-tissue]
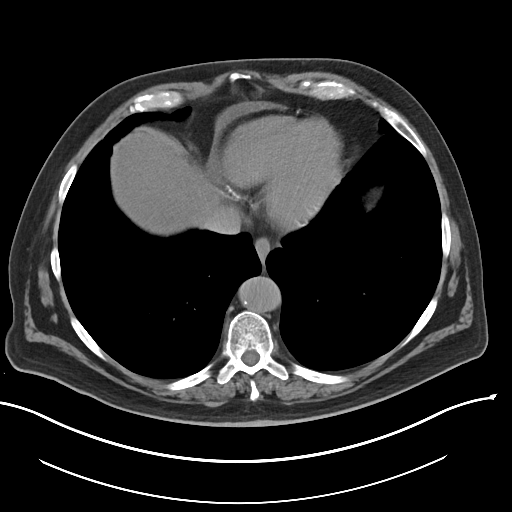
[im 87/97  lung]
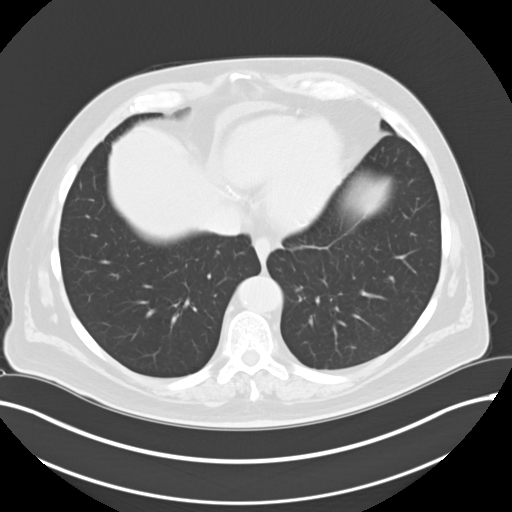
[im 87/97  bone]
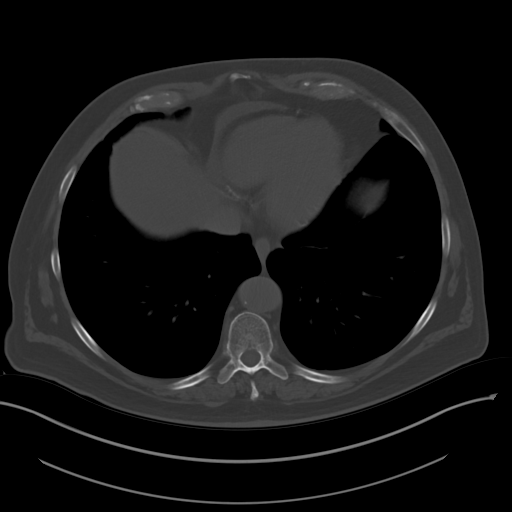
[im 92/97  lung]
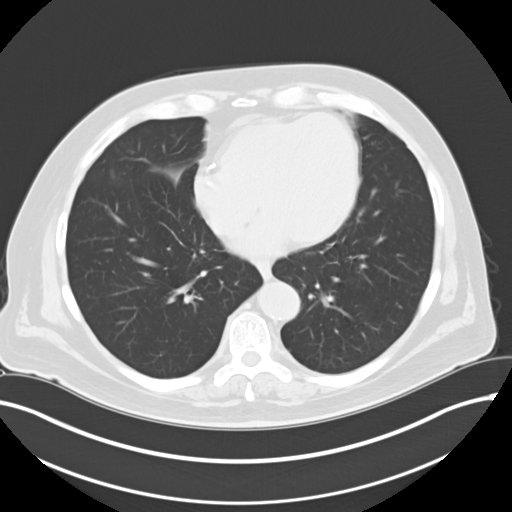

[Series 4: renal stone 2.00 cor · coronal · 0.82mm/px · 3 of 158 slices shown]
[im 32/158  soft-tissue]
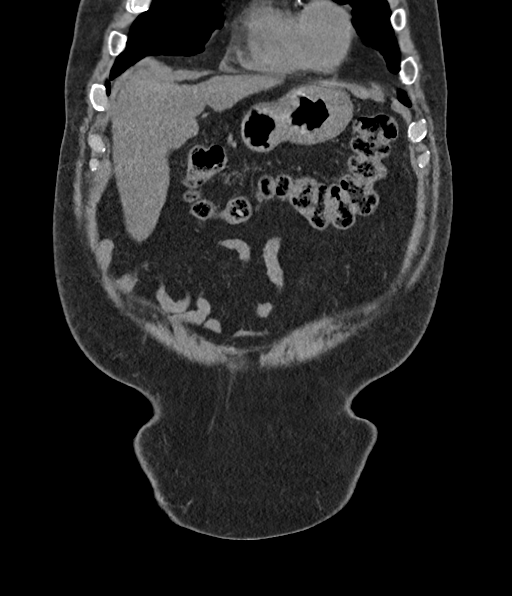
[im 63/158  soft-tissue]
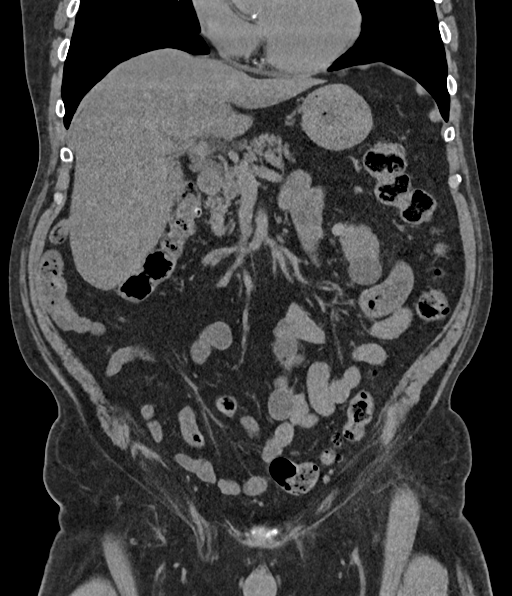
[im 95/158  soft-tissue]
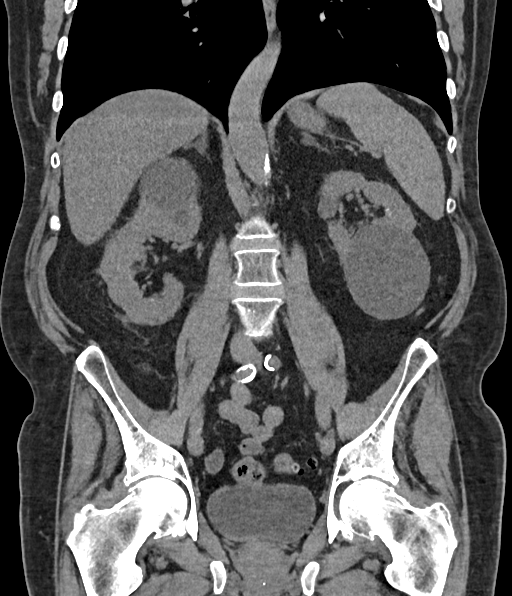

[14 of 46 positions shown; findings below may reference images not displayed]

FINDINGS: Lower chest: Lung bases are clear.

Hepatobiliary: No focal hepatic lesion. Postcholecystectomy. No
biliary dilatation.

Pancreas: Pancreas is normal. No ductal dilatation. No pancreatic
inflammation.

Spleen: Normal spleen

Adrenals/urinary tract: Adrenal glands normal.

There multiple coarse calcifications within the LEFT kidney
unchanged from comparison exam. Calcifications measure 8-10 mm each.
Findings are similar to CT 09/24/2016. No ureterolithiasis or
obstructive uropathy on the LEFT.

Smaller calcifications in the RIGHT kidney which are also unchanged.
No RIGHT ureterolithiasis.

Again demonstrated bilateral low-attenuation renal cystic lesions
with peripheral calcifications. No change from comparison MRI or
CTs. Bladder normal.

Stomach/Bowel: Stomach, small bowel, appendix, and cecum are normal.

Anastomosis in the proximal sigmoid colon without evidence
obstruction. There several diverticula LEFT colon without acute
inflammation. Rectum normal

Vascular/Lymphatic: Abdominal aorta is normal caliber with
atherosclerotic calcification. There is no retroperitoneal or
periportal lymphadenopathy. No pelvic lymphadenopathy.

Reproductive: Prostate normal

Other: No free fluid.

Musculoskeletal: No aggressive osseous lesion.
IMPRESSION: 1. Course bilateral nephrolithiasis with greater stone burden on the
LEFT. Findings similar to comparison CT September 2016. No
ureterolithiasis or obstructive uropathy.
2. Bilateral simple fluid attenuation cystic lesions some with
peripheral calcifications. Recommend following recommendations from
most recent MRI (yearly MRI follow-up).
3. No bladder calculi.
4. Colonic diverticulosis without evidence diverticulitis.
5.  Aortic Atherosclerosis ([id number]).

## 2020-09-28 ENCOUNTER — Ambulatory Visit (INDEPENDENT_AMBULATORY_CARE_PROVIDER_SITE_OTHER): Payer: Medicare Other | Admitting: Pharmacist

## 2020-09-28 DIAGNOSIS — N400 Enlarged prostate without lower urinary tract symptoms: Secondary | ICD-10-CM

## 2020-09-28 DIAGNOSIS — E118 Type 2 diabetes mellitus with unspecified complications: Secondary | ICD-10-CM

## 2020-09-28 DIAGNOSIS — I25118 Atherosclerotic heart disease of native coronary artery with other forms of angina pectoris: Secondary | ICD-10-CM

## 2020-09-28 DIAGNOSIS — I10 Essential (primary) hypertension: Secondary | ICD-10-CM

## 2020-09-28 DIAGNOSIS — E782 Mixed hyperlipidemia: Secondary | ICD-10-CM

## 2020-09-28 NOTE — Chronic Care Management (AMB) (Signed)
Chronic Care Management Pharmacy Note  09/28/2020 Name:  [name] [name] MRN:  [id number] DOB:  09-21-1940  Subjective: [name] [name] is an 80 y.o. year old male who is a primary patient of Vidal Schwalbe, [name], [name].  The CCM team was consulted for assistance with disease management and care coordination needs.    Engaged with patient by telephone for follow up visit in response to provider referral for pharmacy case management and/or care coordination services.   Consent to Services:  The patient was given information about Chronic Care Management services, agreed to services, and gave verbal consent prior to initiation of services.  Please see initial visit note for detailed documentation.   Patient Care Team: [name], [name] as PCP - General (Family Medicine) [name], MD as PCP - Cardiology (Cardiology) Vickie Epley, MD as PCP - Electrophysiology (Cardiology) De Hollingshead, RPH-CPP as Pharmacist (Pharmacist)   Objective:  Lab Results  Component Value Date   CREATININE 0.97 08/02/2020   CREATININE 0.80 07/11/2020   CREATININE 0.75 05/20/2020    Lab Results  Component Value Date   HGBA1C 6.5 (A) 07/28/2020   Last diabetic Eye exam:  Lab Results  Component Value Date/Time   HMDIABEYEEXA No Retinopathy 09/15/2020 12:00 AM    Last diabetic Foot exam: No results found for: HMDIABFOOTEX      Component Value Date/Time   CHOL 124 12/21/2019 0919   TRIG 267 (H) 12/21/2019 0919   HDL 36 (L) 12/21/2019 0919   CHOLHDL 3.4 12/21/2019 0919   VLDL 53 (H) 12/21/2019 0919   LDLCALC 35 12/21/2019 0919   LDLDIRECT 55.0 02/10/2019 0903    Hepatic Function Latest Ref Rng & Units 08/02/2020 05/20/2020 03/28/2020  Total Protein 6.0 - 8.5 g/dL 6.6 7.3 6.6  Albumin 3.7 - 4.7 g/dL 4.0 4.1 4.0  AST 0 - 40 IU/L 37 40 33  ALT 0 - 44 IU/L 37 36 38  Alk Phosphatase 44 - 121 IU/L 70 52 52  Total Bilirubin 0.0 - 1.2 mg/dL 1.0 1.7(H) 1.2  Bilirubin, Direct  0.0 - 0.2 mg/dL - - -    Lab Results  Component Value Date/Time   TSH 2.470 08/02/2020 02:16 PM   TSH 1.36 03/28/2020 11:36 AM   FREET4 1.29 08/02/2020 02:16 PM   FREET4 1.05 01/08/2017 11:15 AM    CBC Latest Ref Rng & Units 04/19/2020 03/28/2020 02/16/2020  WBC 4.0 - 10.5 K/uL 7.6 7.8 7.4  Hemoglobin 13.0 - 17.0 g/dL 14.6 14.4 14.7  Hematocrit 39.0 - 52.0 % 41.2 40.9 42.1  Platelets 150 - 400 K/uL 163 148.0(L) 158    Lab Results  Component Value Date/Time   VD25OH 33.0 01/08/2017 11:15 AM    Clinical ASCVD: Yes  The ASCVD Risk score ([name] DK, et al., 2019) failed to calculate for the following reasons:   The 2019 ASCVD risk score is only valid for ages 1 to 77   The patient has a prior MI or stroke diagnosis      Social History   Tobacco Use  Smoking Status Some Days   Types: Pipe  Smokeless Tobacco Former   Quit date: 10/17/2015   BP Readings from Last 3 Encounters:  08/30/20 118/77  08/15/20 106/63  08/02/20 120/72   Pulse Readings from Last 3 Encounters:  08/30/20 61  08/15/20 (!) 53  08/02/20 (!) 58   Wt Readings from Last 3 Encounters:  08/30/20 203 lb (92.1 kg)  08/15/20 202 lb (91.6 kg)  08/02/20 201 lb (91.2 kg)    Assessment: Review of patient past medical history, allergies, medications, health status, including review of consultants reports, laboratory and other test data, was performed as part of comprehensive evaluation and provision of chronic care management services.   SDOH:  (Social Determinants of Health) assessments and interventions performed:    CCM Care Plan  Allergies  Allergen Reactions   Metformin And Related Diarrhea and Other (See Comments)    Leg cramps, also   Azithromycin Other (See Comments)    Not recommended - no reaction   Other Other (See Comments)    If taking antibiotics for awhile, thrush results   Percocet [Oxycodone-Acetaminophen] Other (See Comments)    Hallucinations   Jardiance [Empagliflozin] Other (See  Comments)    Tongue itching/reaction    Medications Reviewed Today     Reviewed by De Hollingshead, RPH-CPP (Pharmacist) on 09/28/20 at 0949  Med List Status: <None>   Medication Order Taking? Sig Documenting Provider Last Dose Status Informant  acetaminophen (TYLENOL) 500 MG tablet [id number] Yes Take 1,000 mg by mouth every 6 (six) hours as needed (pain.). [provider] Taking Active   amiodarone (PACERONE) 200 MG tablet [id number] Yes Take 200 mg by mouth daily. [provider] Taking Active   aspirin EC 81 MG tablet [id number] Yes Take 1 tablet (81 mg total) by mouth daily. Swallow whole. [name], MD Taking Active   carvedilol (COREG) 6.25 MG tablet [id number] Yes Take 6.25 mg by mouth daily. [provider] Taking Active   clopidogrel (PLAVIX) 75 MG tablet [id number] Yes TAKE 1 TABLET(75 MG) BY MOUTH DAILY WITH BREAKFAST [name], [name], MD Taking Active   ezetimibe (ZETIA) 10 MG tablet [id number] Yes TAKE 1 TABLET(10 MG) BY MOUTH DAILY [name], [name], MD Taking Active   famotidine (PEPCID) 20 MG tablet [id number] Yes Take 20 mg by mouth daily. [provider] Taking Active   fluticasone (FLONASE) 50 MCG/ACT nasal spray [id number] Yes Place into both nostrils daily as needed for allergies or rhinitis. [provider] Taking Active   glipiZIDE (GLUCOTROL) 10 MG tablet [id number] Yes Take 1 tablet (10 mg total) by mouth 2 (two) times daily before a meal. Take with largest meals. [name], [name] Taking Active   lisinopril (ZESTRIL) 10 MG tablet [id number] Yes Take 10 mg by mouth daily. [provider] Taking Active   midodrine (PROAMATINE) 5 MG tablet [id number] Yes Take 1 tablet (5 mg total) by mouth 2 (two) times daily as needed. Take when BP does drop less than 110 when standing. Additional dose at 2 pm if still dropping below 110 when standing [name], [name], MD Taking Active   Multiple Vitamin (MULTIVITAMIN) tablet  [id number] Yes Take 1 tablet by mouth daily. [provider] Taking Active   nitroGLYCERIN (NITROSTAT) 0.4 MG SL tablet [id number]  Place 1 tablet (0.4 mg total) under the tongue every 5 (five) minutes x 3 doses as needed for chest pain. Tommie Raymond, NP  Active   [facility] VERIO test strip [id number] Yes USE TO CHECK BLOOD SUGAR TWICE DAILY [name], [name] Taking Active   pantoprazole (PROTONIX) 40 MG tablet [id number] Yes Take 1 tablet (40 mg total) by mouth daily. [name], MD Taking Active   Polyvinyl Alcohol-Povidone (MURINE TEARS FOR DRY EYES OP) [id number] Yes Place 1-2 drops into both eyes as needed (for dry eyes).  [provider] Taking Active Self  psyllium (METAMUCIL) 58.6 % powder [id number]  Yes Take 1 packet by mouth at bedtime. [provider] Taking Active Self           Med Note Antony Madura, Melody Comas Jul 07, 2019  6:13 PM)    rosuvastatin (CRESTOR) 10 MG tablet [id number] Yes Take 10 mg by mouth daily. [provider] Taking Active   Semaglutide, 1 MG/DOSE, (OZEMPIC, 1 MG/DOSE,) 4 MG/3ML SOPN [id number] Yes ADMINISTER 1 MG UNDER THE SKIN 1 TIME A WEEK [name], Lyn Records, [name] Taking Active            Med Note Feliz Beam, Andris Flurry Sep 28, 2020  9:48 AM) ~ 0.5 mg (~37 clicks)  sodium chloride (OCEAN) 0.65 % SOLN nasal spray [id number] Yes Place 1 spray into both nostrils as needed for congestion. [provider] Taking Active Self  spironolactone (ALDACTONE) 25 MG tablet [id number] Yes Take 25 mg by mouth daily. [provider] Taking Active   tamsulosin (FLOMAX) 0.4 MG CAPS capsule [id number] Yes TAKE 1 CAPSULE(0.4 MG) BY MOUTH DAILY Sondra Come, MD Taking Active   traMADol (ULTRAM) 50 MG tablet [id number] Yes TAKE 1 TABLET BY MOUTH EVERY 12 HOURS AS NEEDED Allegra Grana, [name] Taking Active             Patient Active Problem List   Diagnosis Date Noted   Lung nodule 07/31/2020   Elevated bilirubin  07/10/2020   Fatigue 03/31/2020   Malfunction of implantable defibrillator ventricular (ICD) lead 02/15/2020   Cardiomyopathy (HCC)    Chronic systolic heart failure (HCC) 02/02/2020   Mural thrombus of left ventricle 02/02/2020   ICD (implantable cardioverter-defibrillator) in place 02/02/2020   Ischemic cardiomyopathy    VT (ventricular tachycardia) (HCC) 07/09/2019   NSTEMI (non-ST elevated myocardial infarction) (HCC) 07/07/2019   De Quervain's tenosynovitis, left 05/17/2019   Esophagitis, unspecified without bleeding    Esophageal ulcer 11/20/2018   Acute left flank pain 10/23/2018   Post-nasal drip 10/23/2018   Allergic rhinitis 10/23/2018   Right shoulder pain 07/03/2018   Skin lesion 06/12/2018   Pruritus 06/12/2018   Kidney stones, calcium oxalate 05/28/2018   Atherosclerosis of native coronary artery with stable angina pectoris (HCC) 05/17/2018   GERD (gastroesophageal reflux disease) 02/23/2018   SCC (squamous cell carcinoma) 05/15/2017   Cervical stenosis of spine 05/15/2017   Tobacco abuse 01/26/2017   Cardiac murmur 01/08/2017   Right renal mass 10/31/2016   Abdominal pain 10/31/2016   Fatty liver 10/31/2016   Hematuria 10/31/2016   Acute non-recurrent maxillary sinusitis 10/17/2015   Carotid artery disease (HCC) 10/03/2015   Neck pain 07/28/2015   Atherosclerosis of abdominal aorta (HCC) 07/28/2015   CAD (coronary artery disease) 07/10/2015   Essential hypertension 07/10/2015   Mixed hyperlipidemia 07/10/2015   Controlled type 2 diabetes mellitus with complication, without long-term current use of insulin (HCC) 07/10/2015   History of melanoma 07/10/2015   Chronic low back pain 07/10/2015   BPH (benign prostatic hyperplasia) 07/10/2015   Bilateral hand numbness 07/10/2015   Insomnia 07/10/2015    Immunization History  Administered Date(s) Administered   Influenza, High Dose Seasonal PF 11/01/2015, 10/20/2017, 09/28/2019   Influenza,inj,Quad PF,6+ Mos  10/08/2016   Influenza-Unspecified 10/01/2018   PFIZER(Purple Top)SARS-COV-2 Vaccination 02/04/2019, 02/25/2019, 10/12/2019    Conditions to be addressed/monitored: CAD, HTN, HLD, and DMII  Care Plan : Medication Management  Updates made by Lourena Simmonds, RPH-CPP since 09/28/2020 12:00 AM     Problem: Diabetes, CAD, HTN, HLD  Long-Range Goal: Disease Progression Prevention   This Visit's Progress: On track  Recent Progress: On track  Priority: High  Note:   Current Barriers:  Unable to independently afford treatment regimen Unable to achieve control of diabetes   Pharmacist Clinical Goal(s):  Over the next 90 days, patient will verbalize ability to afford treatment regimen Over the next 90 days, patient will maintain control of diabetes as evidenced by A1c  through collaboration with PharmD and provider.   Interventions: 1:1 collaboration with [name], [name] regarding development and update of comprehensive plan of care as evidenced by provider attestation and co-signature Inter-disciplinary care team collaboration (see longitudinal plan of care) Comprehensive medication review performed; medication list updated in electronic medical record  Health Maintenance Yearly diabetic eye exam: up to date Yearly diabetic foot exam: up to date Urine microalbumin: up to date Yearly influenza vaccination: due - recommended he pursue yearly dose at local pharmacy Td/Tdap vaccination: up to date Pneumonia vaccination: up to date COVID vaccinations: due - recommended he pursue bivalent booster at local pharmacy Shingrix vaccinations: due - recommended he pursue at local pharmacy Colonoscopy: up to date  Diabetes: Controlled per last A1c; current treatment: Ozempic 0.5 mg weekly (37 clicks); glipizide 10 mg BID Has been unable to tolerate metformin, IR and ER, d/t diarrhea.  Did not tolerate Jardiance d/t tongue itching/sores, avoid other SGLT2 for risk of allergic  reaction Current glucose readings: fastings ~ 100-150s Denies hypoglycemic concerns. Denies GI upset concerns.  Recommended to continue current regimen at this time. Continue to monitor for hypoglycemia w/ high dose of glipizide. Would avoid pioglitazone due to cardiovascular disease and HF risk, and insulin would carry the same risk of hypoglycemia.   Hypertension, Ventricular Ectopy, s/p ICD placement Controlled per last office visit; current treatment: carvedilol 6.25 mg BID, spironolactone 25 mg QAM, lisinopril 10 mg QAM; follows w/ Dr. Rockey Situ Antiarrhythmic: amiodarone 200 mg daily; follows w/ Dr. Quentin Ore Occasional low blood pressures (SBP <100), had 2 episodes last week and has been requiring midodrine BID for the past 3 days. Since then, he moved amiodarone from QAM to QPM; BP this morning was 110/66, HR 51; later, it was 106/56, HR 60. Denies consistent concerns with presyncopal symptoms. He is concerned about relative lability of BP (~ 13-244 systolic), particularly when his machine is telling him he has an abnormal rhythm.  Has confirmed accuracy with home BP [employer] machine through comparison at an office visit  Midodrine 5 mg by Dr. Rockey Situ to take PRN SBP <110;  Recommended to continue current regimen along with collaboration with cardiology and EP as above  Hyperlipidemia and ASCVD risk reduction: Very well controlled on last lab work, but not checked on current regimen; current treatment: rosuvastatin 10 mg daily, ezetimibe 10 mg daily   Previously tried high intensity rosuvastatin, but patient did not tolerate due to muscle pains. Antiplatelet regimen: clopidogrel 75 mg daily, aspirin 81 mg daily  Recommended to continue current regimen at this time along with cardiology collaboration. Recheck lipid panel with next set of labwork.  BPH: Controlled per patient report; current regimen: tamsulosin 0.4 mg daily; follows w/ Dr. Diamantina Providence Recommended to continue current regimen at this  time  GERD: Controlled per patient report; current regimen: pantoprazole 40 mg daily, famotidine 40 mg daily  Recommended to continue current regimen at this time  Chronic Pain; Controlled per patient report; current regimen: tramadol 50 mg BID; acetaminophen 500 mg PRN for back pain, taking ~ 1-2 times weekl  Previously recommended to continue current regimen at this time along with collaboration with PCP  Patient Goals/Self-Care Activities Over the next 90 days, patient will:  - take medications as prescribed check glucose daily, document, and provide at future appointments check blood pressure daily, document, and provide at future appointments collaborate with provider on medication access solutions  Follow Up Plan: Telephone follow up appointment with care management team member scheduled for: ~ 12 weeks      Medication Assistance:  Ozempic obtained through [employer] medication assistance program.  Enrollment ends 12/13/20  Patient's preferred pharmacy is:  [id number] [address], [place] [address] Phone: [phone] Fax: [phone]    Follow Up:  Patient agrees to Care Plan and Follow-up.  Plan: Telephone follow up appointment with care management team member scheduled for:  ~ 12 weeks  [name], PharmD, [place], [place] Clinical Pharmacist [employer] at [employer] [phone]

## 2020-09-28 NOTE — Patient Instructions (Signed)
Visit Information  PATIENT GOALS:  Goals Addressed               This Visit's Progress     Patient Stated     Medication Monitoring (pt-stated)        Patient Goals/Self-Care Activities Over the next 90 days, patient will:  - take medications as prescribed check glucose daily, document, and provide at future appointments check blood pressure daily, document, and provide at future appointments collaborate with provider on medication access solutions         Patient verbalizes understanding of instructions provided today and agrees to view in [place].   Plan: Telephone follow up appointment with care management team member scheduled for:  ~ 12 weeks  [name], PharmD, [place], [place] Clinical Pharmacist [employer] [facility] [employer] [phone]

## 2020-10-09 ENCOUNTER — Telehealth: Payer: Self-pay | Admitting: Family

## 2020-10-09 ENCOUNTER — Telehealth: Payer: Self-pay | Admitting: Cardiovascular Disease

## 2020-10-09 ENCOUNTER — Ambulatory Visit: Payer: Medicare Other | Admitting: Pharmacist

## 2020-10-09 ENCOUNTER — Other Ambulatory Visit: Payer: Self-pay | Admitting: Cardiology

## 2020-10-09 DIAGNOSIS — E782 Mixed hyperlipidemia: Secondary | ICD-10-CM

## 2020-10-09 DIAGNOSIS — E1165 Type 2 diabetes mellitus with hyperglycemia: Secondary | ICD-10-CM

## 2020-10-09 DIAGNOSIS — I10 Essential (primary) hypertension: Secondary | ICD-10-CM

## 2020-10-09 DIAGNOSIS — I25118 Atherosclerotic heart disease of native coronary artery with other forms of angina pectoris: Secondary | ICD-10-CM

## 2020-10-09 DIAGNOSIS — G2581 Restless legs syndrome: Secondary | ICD-10-CM

## 2020-10-09 MED ORDER — OZEMPIC (0.25 OR 0.5 MG/DOSE) 2 MG/1.5ML ~~LOC~~ SOPN: 0.5000 mg | PEN_INJECTOR | SUBCUTANEOUS | 2 refills | Status: DC

## 2020-10-09 MED ORDER — GLIPIZIDE 10 MG PO TABS: ORAL_TABLET | ORAL | 3 refills | Status: DC

## 2020-10-09 NOTE — Telephone Encounter (Signed)
FYI [name] [name]  [name] [name]  Call pt Reviewed [name] [name] Darnelle Maffucci' note and agree with recommendation:  Recommend to decrease breakfast glipizide to 5 mg, continue 10 mg with supper, and resume Ozempic 0.5 mg weekly.  I have sent in new prescriptions for the above.  Please make sure patient is aware and has follow-up scheduled with me

## 2020-10-09 NOTE — Telephone Encounter (Signed)
Made in error

## 2020-10-09 NOTE — Chronic Care Management (AMB) (Signed)
Chronic Care Management Pharmacy Note  10/09/2020 Name:  [name] [name] MRN:  [id number] DOB:  26-Nov-1940   Subjective: [name] [name] is an 80 y.o. year old male who is a primary patient of Vidal Schwalbe, [name], [name].  The CCM team was consulted for assistance with disease management and care coordination needs.    Engaged with patient by telephone for follow up visit in response to provider referral for pharmacy case management and/or care coordination services.   Consent to Services:  The patient was given information about Chronic Care Management services, agreed to services, and gave verbal consent prior to initiation of services.  Please see initial visit note for detailed documentation.   Patient Care Team: [name], [name] as PCP - General (Family Medicine) [name], MD as PCP - Cardiology (Cardiology) Vickie Epley, MD as PCP - Electrophysiology (Cardiology) De Hollingshead, RPH-CPP as Pharmacist (Pharmacist)   Objective:  Lab Results  Component Value Date   CREATININE 0.97 08/02/2020   CREATININE 0.80 07/11/2020   CREATININE 0.75 05/20/2020    Lab Results  Component Value Date   HGBA1C 6.5 (A) 07/28/2020   Last diabetic Eye exam:  Lab Results  Component Value Date/Time   HMDIABEYEEXA No Retinopathy 09/15/2020 12:00 AM    Last diabetic Foot exam: No results found for: HMDIABFOOTEX      Component Value Date/Time   CHOL 124 12/21/2019 0919   TRIG 267 (H) 12/21/2019 0919   HDL 36 (L) 12/21/2019 0919   CHOLHDL 3.4 12/21/2019 0919   VLDL 53 (H) 12/21/2019 0919   LDLCALC 35 12/21/2019 0919   LDLDIRECT 55.0 02/10/2019 0903    Hepatic Function Latest Ref Rng & Units 08/02/2020 05/20/2020 03/28/2020  Total Protein 6.0 - 8.5 g/dL 6.6 7.3 6.6  Albumin 3.7 - 4.7 g/dL 4.0 4.1 4.0  AST 0 - 40 IU/L 37 40 33  ALT 0 - 44 IU/L 37 36 38  Alk Phosphatase 44 - 121 IU/L 70 52 52  Total Bilirubin 0.0 - 1.2 mg/dL 1.0 1.7(H) 1.2  Bilirubin, Direct  0.0 - 0.2 mg/dL - - -    Lab Results  Component Value Date/Time   TSH 2.470 08/02/2020 02:16 PM   TSH 1.36 03/28/2020 11:36 AM   FREET4 1.29 08/02/2020 02:16 PM   FREET4 1.05 01/08/2017 11:15 AM    CBC Latest Ref Rng & Units 04/19/2020 03/28/2020 02/16/2020  WBC 4.0 - 10.5 K/uL 7.6 7.8 7.4  Hemoglobin 13.0 - 17.0 g/dL 14.6 14.4 14.7  Hematocrit 39.0 - 52.0 % 41.2 40.9 42.1  Platelets 150 - 400 K/uL 163 148.0(L) 158    Lab Results  Component Value Date/Time   VD25OH 33.0 01/08/2017 11:15 AM   Social History   Tobacco Use  Smoking Status Some Days   Types: Pipe  Smokeless Tobacco Former   Quit date: 10/17/2015   BP Readings from Last 3 Encounters:  08/30/20 118/77  08/15/20 106/63  08/02/20 120/72   Pulse Readings from Last 3 Encounters:  08/30/20 61  08/15/20 (!) 53  08/02/20 (!) 58   Wt Readings from Last 3 Encounters:  08/30/20 203 lb (92.1 kg)  08/15/20 202 lb (91.6 kg)  08/02/20 201 lb (91.2 kg)    Assessment: Review of patient past medical history, allergies, medications, health status, including review of consultants reports, laboratory and other test data, was performed as part of comprehensive evaluation and provision of chronic care management services.   SDOH:  (Social Determinants of Health)  assessments and interventions performed:  SDOH Interventions    Flowsheet Row Most Recent Value  SDOH Interventions   Financial Strain Interventions Other (Comment)  [manufacturer assistance]       CCM Care Plan  Allergies  Allergen Reactions   Metformin And Related Diarrhea and Other (See Comments)    Leg cramps, also   Azithromycin Other (See Comments)    Not recommended - no reaction   Other Other (See Comments)    If taking antibiotics for awhile, thrush results   Percocet [Oxycodone-Acetaminophen] Other (See Comments)    Hallucinations   Jardiance [Empagliflozin] Other (See Comments)    Tongue itching/reaction    Medications Reviewed Today      Reviewed by De Hollingshead, RPH-CPP (Pharmacist) on 09/28/20 at [phone]  Med List Status: <None>   Medication Order Taking? Sig Documenting Provider Last Dose Status Informant  acetaminophen (TYLENOL) 500 MG tablet [id number] Yes Take 1,000 mg by mouth every 6 (six) hours as needed (pain.). [provider] Taking Active   amiodarone (PACERONE) 200 MG tablet [id number] Yes Take 200 mg by mouth daily. [provider] Taking Active   aspirin EC 81 MG tablet [id number] Yes Take 1 tablet (81 mg total) by mouth daily. Swallow whole. [name], MD Taking Active   carvedilol (COREG) 6.25 MG tablet [id number] Yes Take 6.25 mg by mouth daily. [provider] Taking Active   clopidogrel (PLAVIX) 75 MG tablet [id number] Yes TAKE 1 TABLET(75 MG) BY MOUTH DAILY WITH BREAKFAST [name], [name], MD Taking Active   ezetimibe (ZETIA) 10 MG tablet [id number] Yes TAKE 1 TABLET(10 MG) BY MOUTH DAILY [name], [name], MD Taking Active   famotidine (PEPCID) 20 MG tablet [id number] Yes Take 20 mg by mouth daily. [provider] Taking Active   fluticasone (FLONASE) 50 MCG/ACT nasal spray [id number] Yes Place into both nostrils daily as needed for allergies or rhinitis. [provider] Taking Active   glipiZIDE (GLUCOTROL) 10 MG tablet [id number] Yes Take 1 tablet (10 mg total) by mouth 2 (two) times daily before a meal. Take with largest meals. [name], [name] Taking Active   lisinopril (ZESTRIL) 10 MG tablet [id number] Yes Take 10 mg by mouth daily. [provider] Taking Active   midodrine (PROAMATINE) 5 MG tablet [id number] Yes Take 1 tablet (5 mg total) by mouth 2 (two) times daily as needed. Take when BP does drop less than 110 when standing. Additional dose at 2 pm if still dropping below 110 when standing [name], [name], MD Taking Active   Multiple Vitamin (MULTIVITAMIN) tablet [id number] Yes Take 1 tablet by mouth daily. [provider] Taking  Active   nitroGLYCERIN (NITROSTAT) 0.4 MG SL tablet [id number]  Place 1 tablet (0.4 mg total) under the tongue every 5 (five) minutes x 3 doses as needed for chest pain. Tommie Raymond, NP  Active   [facility] VERIO test strip [id number] Yes USE TO CHECK BLOOD SUGAR TWICE DAILY [name], [name] Taking Active   pantoprazole (PROTONIX) 40 MG tablet [id number] Yes Take 1 tablet (40 mg total) by mouth daily. [name], MD Taking Active   Polyvinyl Alcohol-Povidone (MURINE TEARS FOR DRY EYES OP) [id number] Yes Place 1-2 drops into both eyes as needed (for dry eyes).  [provider] Taking Active Self  psyllium (METAMUCIL) 58.6 % powder [id number] Yes Take 1 packet by mouth at bedtime. [provider] Taking Active Self  Med Note Duffy [name], [name]   Wed Jul 07, 2019  6:13 PM)    rosuvastatin (CRESTOR) 10 MG tablet [id number] Yes Take 10 mg by mouth daily. [provider] Taking Active   Semaglutide, 1 MG/DOSE, (OZEMPIC, 1 MG/DOSE,) 4 MG/3ML SOPN [id number] Yes ADMINISTER 1 MG UNDER THE SKIN 1 TIME A WEEK [name], [name], [name] Taking Active            Med Note Darnelle Maffucci, Maralyn Sago Sep 28, 2020  9:48 AM) ~ 0.5 mg (~70 clicks)  sodium chloride (OCEAN) 0.65 % SOLN nasal spray [id number] Yes Place 1 spray into both nostrils as needed for congestion. [provider] Taking Active Self  spironolactone (ALDACTONE) 25 MG tablet [id number] Yes Take 25 mg by mouth daily. [provider] Taking Active   tamsulosin (FLOMAX) 0.4 MG CAPS capsule [id number] Yes TAKE 1 CAPSULE(0.4 MG) BY MOUTH DAILY [name], MD Taking Active   traMADol (ULTRAM) 50 MG tablet [id number] Yes TAKE 1 TABLET BY MOUTH EVERY 12 HOURS AS NEEDED [name], [name] Taking Active             Patient Active Problem List   Diagnosis Date Noted   Lung nodule 07/31/2020   Elevated bilirubin 07/10/2020   Fatigue 03/31/2020   Malfunction of implantable defibrillator  ventricular (ICD) lead 02/15/2020   Cardiomyopathy ([place])    Chronic systolic heart failure ([place]) 02/02/2020   Mural thrombus of left ventricle 02/02/2020   ICD (implantable cardioverter-defibrillator) in place 02/02/2020   Ischemic cardiomyopathy    VT (ventricular tachycardia) ([place]) 07/09/2019   NSTEMI (non-ST elevated myocardial infarction) ([place]) 07/07/2019   De Quervain's tenosynovitis, left 05/17/2019   Esophagitis, unspecified without bleeding    Esophageal ulcer 11/20/2018   Acute left flank pain 10/23/2018   Post-nasal drip 10/23/2018   Allergic rhinitis 10/23/2018   Right shoulder pain 07/03/2018   Skin lesion 06/12/2018   Pruritus 06/12/2018   Kidney stones, calcium oxalate 05/28/2018   Atherosclerosis of native coronary artery with stable angina pectoris ([place]) 05/17/2018   GERD (gastroesophageal reflux disease) 02/23/2018   SCC (squamous cell carcinoma) 05/15/2017   Cervical stenosis of spine 05/15/2017   Tobacco abuse 01/26/2017   Cardiac murmur 01/08/2017   Right renal mass 10/31/2016   Abdominal pain 10/31/2016   Fatty liver 10/31/2016   Hematuria 10/31/2016   Acute non-recurrent maxillary sinusitis 10/17/2015   Carotid artery disease ([place]) 10/03/2015   Neck pain 07/28/2015   Atherosclerosis of abdominal aorta ([place]) 07/28/2015   CAD (coronary artery disease) 07/10/2015   Essential hypertension 07/10/2015   Mixed hyperlipidemia 07/10/2015   Controlled type 2 diabetes mellitus with complication, without long-term current use of insulin ([place]) 07/10/2015   History of melanoma 07/10/2015   Chronic low back pain 07/10/2015   BPH (benign prostatic hyperplasia) 07/10/2015   Bilateral hand numbness 07/10/2015   Insomnia 07/10/2015    Immunization History  Administered Date(s) Administered   Influenza, High Dose Seasonal PF 11/01/2015, 10/20/2017, 09/28/2019   Influenza,inj,Quad PF,6+ Mos 10/08/2016   Influenza-Unspecified 10/01/2018   PFIZER(Purple Top)SARS-COV-2  Vaccination 02/04/2019, 02/25/2019, 10/12/2019    Conditions to be addressed/monitored: CAD, HTN, HLD, and DMII  Care Plan : Medication Management  Updates made by De Hollingshead, RPH-CPP since 10/09/2020 12:00 AM     Problem: Diabetes, CAD, HTN, HLD      Long-Range Goal: Disease Progression Prevention   This Visit's Progress: On track  Recent Progress: On track  Priority: High  Note:   Current Barriers:  Unable to independently afford treatment regimen Unable to achieve control of diabetes   Pharmacist Clinical Goal(s):  Over the next 90 days, patient will verbalize ability to afford treatment regimen Over the next 90 days, patient will maintain control of diabetes as evidenced by A1c  through collaboration with PharmD and provider.   Interventions: 1:1 collaboration with [name], [name] regarding development and update of comprehensive plan of care as evidenced by provider attestation and co-signature Inter-disciplinary care team collaboration (see longitudinal plan of care) Comprehensive medication review performed; medication list updated in electronic medical record  Health Maintenance Yearly diabetic eye exam: up to date Yearly diabetic foot exam: up to date Urine microalbumin: up to date Yearly influenza vaccination: due - recommended he pursue yearly dose at local pharmacy Td/Tdap vaccination: up to date Pneumonia vaccination: up to date COVID vaccinations: due - recommended he pursue bivalent booster at local pharmacy Shingrix vaccinations: due - recommended he pursue at local pharmacy Colonoscopy: up to date  Diabetes: Controlled per last A1c; current treatment: Ozempic 0.5 mg weekly (37 clicks on the 4 mg/3 mL 1 mg pen); glipizide 10 mg BID Today reports that he has reduced Ozempic to 20 clicks because he has been having some low blood sugars during the late morning and early afternoon if he eats a meal late. Reports readings in the 60-80s. Has been  unable to tolerate metformin, IR and ER, d/t diarrhea.  Did not tolerate Jardiance d/t tongue itching/sores, avoid other SGLT2 for risk of allergic reaction Reviewed that glipizide most contributing to hypoglycemia. Discussed that we would prefer to maximize Ozempic (based on GI tolerability) and minimize hypoglycemia-prone medications. Recommend to decrease breakfast glipizide to 5 mg, continue 10 mg with supper, and resume Ozempic 0.5 mg weekly. Will discuss w/ PCP.  Hypertension, Ventricular Ectopy, s/p ICD placement Controlled per last office visit; current treatment: carvedilol 6.25 mg BID, spironolactone 25 mg QAM, lisinopril 10 mg QAM; follows w/ Dr. Rockey Situ; self reduced lisinopril to 5 mg over the weekend due to lower BP readings. Antiarrhythmic: amiodarone 200 mg daily; follows w/ Dr. Quentin Ore Midodrine 5 mg by Dr. Rockey Situ to take PRN SBP <110;  Occasional low blood pressures (SBP <100), reported to Dr. Donivan Scull office at the end of last week. He did not hear back, so he self-reduced lisinopril to 10 mg. Does not appear he has reported that self-reduction yet to Dr. Donivan Scull office.  Advised to update Dr. Donivan Scull office on his self-adjustment of lisinopril and subsequent blood pressure readings. Advised to avoid self-adjustment of medications. Recommended to continue current regimen along with collaboration with cardiology and EP as above  Hyperlipidemia and ASCVD risk reduction: Very well controlled on last lab work, but not checked on current regimen; current treatment: rosuvastatin 10 mg daily, ezetimibe 10 mg daily   Previously tried high intensity rosuvastatin, but patient did not tolerate due to muscle pains. Antiplatelet regimen: clopidogrel 75 mg daily, aspirin 81 mg daily  Previously recommended to continue current regimen at this time along with cardiology collaboration. Recheck lipid panel with next set of labwork.  BPH: Controlled per patient report; current regimen: tamsulosin  0.4 mg daily; follows w/ Dr. Diamantina Providence Previously recommended to continue current regimen at this time  GERD: Controlled per patient report; current regimen: pantoprazole 40 mg daily, famotidine 40 mg daily  Previously recommended to continue current regimen at this time  Chronic Pain; Controlled per patient report; current regimen: tramadol 50 mg BID; acetaminophen  500 mg PRN for back pain, taking ~ 1-2 times weekl Previously recommended to continue current regimen at this time along with collaboration with PCP  Patient Goals/Self-Care Activities Over the next 90 days, patient will:  - take medications as prescribed check glucose daily, document, and provide at future appointments check blood pressure daily, document, and provide at future appointments collaborate with provider on medication access solutions  Follow Up Plan: Telephone follow up appointment with care management team member scheduled for: ~8 weeks      Medication Assistance:  Ozempic obtained through [employer] medication assistance program.  Enrollment ends 12/13/20  Patient's preferred pharmacy is:  [id number] [address], [place] [address] Phone: [phone] Fax: [phone]   Follow Up:  Patient agrees to Care Plan and Follow-up.  Plan: Telephone follow up appointment with care management team member scheduled for:  ~8 weeks  [name], PharmD, [place], [place] Clinical Pharmacist [employer] [facility] [employer] [phone]

## 2020-10-09 NOTE — Telephone Encounter (Signed)
Please call mobile number [phone]

## 2020-10-09 NOTE — Patient Instructions (Signed)
Visit Information  PATIENT GOALS:  Goals Addressed               This Visit's Progress     Patient Stated     Medication Monitoring (pt-stated)        Patient Goals/Self-Care Activities Over the next 90 days, patient will:  - take medications as prescribed check glucose daily, document, and provide at future appointments check blood pressure daily, document, and provide at future appointments collaborate with provider on medication access solutions        Patient verbalizes understanding of instructions provided today and agrees to view in [place].    Plan: Telephone follow up appointment with care management team member scheduled for:  ~8 weeks  [name], PharmD, [place], [place] Clinical Pharmacist [employer] [facility] [employer] [phone]

## 2020-10-09 NOTE — Telephone Encounter (Signed)
Patient calling to check on status  States BP is still running low  Last few readings, taken in between 11am - 2pm 94/52 pulse 58 98/67 pulse 63 90/56 pulse 59 Blood pressure seems to gets better late in evening Please call to discuss

## 2020-10-10 NOTE — Telephone Encounter (Signed)
Call patient Restless legs can be very difficult to treat.  I would start with scheduling him for iron stores.  Iron stores are labs that can be exacerbating underlying restless leg syndrome.  I would not make any medication changes until he has appointment with me.  Please schedule   He may have labs done prior

## 2020-10-10 NOTE — Telephone Encounter (Signed)
Patient called and advised on changes. Patient actually took note & was advised that new scripts sent to pharmacy.

## 2020-10-10 NOTE — Telephone Encounter (Signed)
Pt did not want to schedule as of yet, but he wanted to know what he could do what med you thought was the one that helped with his twitching & restless legs? He took tylenol as well as klonopin at 4 am & was able toi go back to sleep. He has been dealing with restless legs for a few weeks now & they are waking him up around 4 am every morning. I offered an appointment, but none available until end of the month.

## 2020-10-10 NOTE — Telephone Encounter (Signed)
I called patient & he is scheduled for labs Friday. He is scheduled first available 11/10/20.

## 2020-10-12 ENCOUNTER — Ambulatory Visit (INDEPENDENT_AMBULATORY_CARE_PROVIDER_SITE_OTHER): Payer: Medicare Other

## 2020-10-12 VITALS — BP 132/80 | HR 65 | Ht 73.0 in | Wt 198.0 lb

## 2020-10-12 DIAGNOSIS — Z0000 Encounter for general adult medical examination without abnormal findings: Secondary | ICD-10-CM

## 2020-10-12 NOTE — Progress Notes (Addendum)
Subjective:   [name] [name] is a 80 y.o. male who presents for Medicare Annual/Subsequent preventive examination.  Review of Systems    No ROS.  Medicare Wellness Virtual Visit.  Visual/audio telehealth visit, UTA vital signs.   See social history for additional risk factors.   Cardiac Risk Factors include: advanced age (>68men, >68 women);male gender;hypertension;diabetes mellitus     Objective:    Today's Vitals   10/12/20 0951  BP: 132/80  Pulse: 65  Weight: 198 lb (89.8 kg)  Height: 6\' 1"  (1.854 m)   Body mass index is 26.12 kg/m.  Advanced Directives 10/12/2020 04/19/2020 02/15/2020 02/02/2020 10/12/2019 07/23/2019 07/07/2019  Does Patient Have a Medical Advance Directive? Yes Yes Yes Yes Yes Yes Yes  Type of Paramedic of [place];Living will [place];Living will Healthcare Power of [place] Living will [place];Living will [place];Living will [place];Living will  Does patient want to make changes to medical advance directive? No - Patient declined - No - Patient declined - No - Patient declined No - Patient declined No - Patient declined  Copy of [place] in Chart? Yes - validated most recent copy scanned in chart (See row information) - No - copy requested - No - copy requested No - copy requested No - copy requested  Would patient like information on creating a medical advance directive? - - - - - No - Patient declined No - Patient declined    Current Medications (verified) Outpatient Encounter Medications as of 10/12/2020  Medication Sig   acetaminophen (TYLENOL) 500 MG tablet Take 1,000 mg by mouth every 6 (six) hours as needed (pain.).   amiodarone (PACERONE) 200 MG tablet Take 200 mg by mouth daily.   aspirin EC 81 MG tablet Take 1 tablet (81 mg total) by mouth daily. Swallow whole.   carvedilol (COREG) 6.25 MG tablet Take 6.25 mg by mouth daily.    clopidogrel (PLAVIX) 75 MG tablet TAKE 1 TABLET(75 MG) BY MOUTH DAILY WITH BREAKFAST   ezetimibe (ZETIA) 10 MG tablet TAKE 1 TABLET(10 MG) BY MOUTH DAILY   famotidine (PEPCID) 20 MG tablet Take 20 mg by mouth daily.   fluticasone (FLONASE) 50 MCG/ACT nasal spray Place into both nostrils daily as needed for allergies or rhinitis.   glipiZIDE (GLUCOTROL) 10 MG tablet Take glipizide 5mg  qam breakfast, and 10mg  qpm with supper.   lisinopril (ZESTRIL) 10 MG tablet Take 10 mg by mouth daily.   midodrine (PROAMATINE) 5 MG tablet Take 1 tablet (5 mg total) by mouth 2 (two) times daily as needed. Take when BP does drop less than 110 when standing. Additional dose at 2 pm if still dropping below 110 when standing   Multiple Vitamin (MULTIVITAMIN) tablet Take 1 tablet by mouth daily.   nitroGLYCERIN (NITROSTAT) 0.4 MG SL tablet Place 1 tablet (0.4 mg total) under the tongue every 5 (five) minutes x 3 doses as needed for chest pain.   Omega-3 Fatty Acids (FISH OIL) 1000 MG CAPS Take 1 capsule by mouth daily.   ONETOUCH VERIO test strip USE TO CHECK BLOOD SUGAR TWICE DAILY   pantoprazole (PROTONIX) 40 MG tablet Take 1 tablet (40 mg total) by mouth daily.   Polyvinyl Alcohol-Povidone (MURINE TEARS FOR DRY EYES OP) Place 1-2 drops into both eyes as needed (for dry eyes).    psyllium (METAMUCIL) 58.6 % powder Take 1 packet by mouth at bedtime.   rosuvastatin (CRESTOR) 10 MG  tablet Take 10 mg by mouth daily.   Semaglutide,0.25 or 0.5MG /DOS, (OZEMPIC, 0.25 OR 0.5 MG/DOSE,) 2 MG/1.5ML SOPN Inject 0.5 mg into the skin once a week.   sodium chloride (OCEAN) 0.65 % SOLN nasal spray Place 1 spray into both nostrils as needed for congestion.   spironolactone (ALDACTONE) 25 MG tablet Take 25 mg by mouth daily.   tamsulosin (FLOMAX) 0.4 MG CAPS capsule TAKE 1 CAPSULE(0.4 MG) BY MOUTH DAILY   traMADol (ULTRAM) 50 MG tablet TAKE 1 TABLET BY MOUTH EVERY 12 HOURS AS NEEDED   No facility-administered encounter medications  on file as of 10/12/2020.    Allergies (verified) Metformin and related, Azithromycin, Other, Percocet [oxycodone-acetaminophen], and Jardiance [empagliflozin]   History: Past Medical History:  Diagnosis Date   Aortic insufficiency    a. noted on TTE 2015; b. 06/2019 Echo: AI not visualized.   Arthritis    CAD (coronary artery disease)    a. remote PCI in 1991 and 2005; b. MV 3/15: old inferior MI, no ischemia, LVEF 50%, slight inferior wall hypokniesis; c. 06/2019 NSTEMI/PCI: LM nl, LAD 80p/m (Atherectomy & 4.5x18 Resolute Onyx DES), 19m/d, D1 75 (PTCA), RI patent stent, LCX nl, RCA 100p, RPAV fills via L->R collats from LCX.   Chicken pox    Colon polyps    4 pre-cancerous    Diverticulitis    DM type 2 (diabetes mellitus, type 2) (HCC)    Family history of adverse reaction to anesthesia    GERD (gastroesophageal reflux disease)    Heart murmur    History of kidney stones    HOH (hard of hearing)    Hyperlipidemia    Hypertension    Ischemic cardiomyopathy    a. TTE 2015: EF  50-55%, mild global HK; b. 06/2019 Echo: EF 45-50%, Gr1 DD, basal inf AK. Triv MR.   Kidney stones    Melanoma ([place]) 1980   Resected from his back   Mitral regurgitation    a. noted on TTE 2015   Myocardial infarction [facility])    Past Surgical History:  Procedure Laterality Date   CHOLECYSTECTOMY  2012   COLONOSCOPY     in 2003 with polyp removed and leak anastomosis had to have open abdominal surgery    [place] & 2005   CORONARY ATHERECTOMY N/A 07/08/2019   Procedure: CORONARY ATHERECTOMY;  Surgeon: Jettie Booze, MD;  Location: [place] [facility];  Service: Cardiovascular;  Laterality: N/A;   CORONARY BALLOON ANGIOPLASTY N/A 07/08/2019   Procedure: CORONARY BALLOON ANGIOPLASTY;  Surgeon: Jettie Booze, MD;  Location: [place] [facility];  Service: Cardiovascular;  Laterality: N/A;  diagonal    CORONARY STENT INTERVENTION N/A 07/08/2019   Procedure:  CORONARY STENT INTERVENTION;  Surgeon: Jettie Booze, MD;  Location: [place] [facility];  Service: Cardiovascular;  Laterality: N/A;  lad   CYSTOSCOPY/URETEROSCOPY/HOLMIUM LASER/STENT PLACEMENT Right 01/02/2018   Procedure: CYSTOSCOPY/URETEROSCOPY/HOLMIUM LASER/STENT PLACEMENT;  Surgeon: [name], MD;  Location: [facility] OR[facility];  Service: Urology;  Laterality: Right;   ESOPHAGOGASTRODUODENOSCOPY (EGD) WITH PROPOFOL N/A 11/19/2016   Procedure: ESOPHAGOGASTRODUODENOSCOPY (EGD) WITH PROPOFOL;  Surgeon: [name], MD;  Location: [facility] ENDOS[facility];  Service: Endoscopy;  Laterality: N/A;   ESOPHAGOGASTRODUODENOSCOPY (EGD) WITH PROPOFOL N/A 02/02/2019   Procedure: ESOPHAGOGASTRODUODENOSCOPY (EGD) WITH PROPOFOL;  Surgeon: [name], MD;  Location: [facility] ENDOSCOPY;  Service: Endoscopy;  Laterality: N/A;   ICD IMPLANT N/A 02/02/2020   Procedure: ICD IMPLANT;  Surgeon: Vickie Epley, MD;  Location: [place] [facility];  Service: Cardiovascular;  Laterality: N/A;   INTRAVASCULAR PRESSURE WIRE/FFR STUDY N/A 07/07/2019   Procedure: INTRAVASCULAR PRESSURE WIRE/FFR STUDY;  Surgeon: Nelva Bush, MD;  Location: [place] [facility];  Service: Cardiovascular;  Laterality: N/A;   INTRAVASCULAR ULTRASOUND/IVUS N/A 07/08/2019   Procedure: Intravascular Ultrasound/IVUS;  Surgeon: Jettie Booze, MD;  Location: [place] [facility];  Service: Cardiovascular;  Laterality: N/A;   LEAD REVISION/REPAIR N/A 02/16/2020   Procedure: LEAD REVISION/REPAIR;  Surgeon: Constance Haw, MD;  Location: [place] [facility];  Service: Cardiovascular;  Laterality: N/A;   LEFT HEART CATH N/A 07/08/2019   Procedure: Left Heart Cath;  Surgeon: Jettie Booze, MD;  Location: [place] [facility];  Service: Cardiovascular;  Laterality: N/A;   LEFT HEART CATH AND CORONARY ANGIOGRAPHY N/A 07/07/2019   Procedure: LEFT HEART CATH AND CORONARY ANGIOGRAPHY;  Surgeon: Nelva Bush, MD;  Location: [place] [facility];   Service: Cardiovascular;  Laterality: N/A;   LITHOTRIPSY  2015   MELANOMA EXCISION  1980   malignant   NERVE SURGERY  2015   ulna nerve   TONSILLECTOMY  1945   WISDOM TOOTH EXTRACTION     Family History  Problem Relation Age of Onset   Hyperlipidemia Mother    Hypertension Mother    Heart disease Mother    Diabetes Mother    Heart attack Mother    Colon cancer Father        metasized to liver, adrenal, lungs   Lung cancer Father    Kidney cancer Father        malignant capsulated kidney tumor   Diabetes Father    Liver cancer Father    Bladder Cancer Neg Hx    Prostate cancer Neg Hx    Social History   Socioeconomic History   Marital status: Married    Spouse name: [place]   Number of children: Not on file   Years of education: Not on file   Highest education level: Not on file  Occupational History   Not on file  Tobacco Use   Smoking status: Some Days    Types: Pipe   Smokeless tobacco: Former    Quit date: 10/17/2015  Vaping Use   Vaping Use: Never used  Substance and Sexual Activity   Alcohol use: Not Currently    Alcohol/week: 0.0 - 1.0 standard drinks   Drug use: No   Sexual activity: Not on file  Other Topics Concern   Not on file  Social History Narrative   Married    Social Determinants of Health   Financial Resource Strain: Medium Risk   Difficulty of Paying Living Expenses: Somewhat hard  Food Insecurity: No Food Insecurity   Worried About Charity fundraiser in the Last Year: Never true   Ran Out of Food in the Last Year: Never true  Transportation Needs: No Transportation Needs   Lack of Transportation (Medical): No   Lack of Transportation (Non-Medical): No  Physical Activity: Insufficiently Active   Days of Exercise per Week: 3 days   Minutes of Exercise per Session: 30 min  Stress: No Stress Concern Present   Feeling of Stress : Not at all  Social Connections: Unknown   Frequency of Communication with Friends and Family: Not on file    Frequency of Social Gatherings with Friends and Family: Not on file   Attends Religious Services: Not on file   Active Member of Clubs or Organizations: Not on  file   Attends Archivist Meetings: Not on file   Marital Status: Married    Tobacco Counseling Ready to quit: Not Answered Counseling given: Not Answered   Clinical Intake:  Pre-visit preparation completed: Yes        Diabetes: Yes (Followed by pcp)  How often do you need to have someone help you when you read instructions, pamphlets, or other written materials from your doctor or pharmacy?: 1 - Never  Nutrition Risk Assessment: Does the patient have any non-healing wounds?  No   Diabetes Management: Is the patient want to be seen by Chronic Care Management for management of their diabetes? Yes  Interpreter Needed?: No      Activities of Daily Living In your present state of health, do you have any difficulty performing the following activities: 10/12/2020 02/02/2020  Hearing? Y N  Comment Hearing aid -  Vision? N N  Difficulty concentrating or making decisions? N N  Walking or climbing stairs? N N  Dressing or bathing? N N  Doing errands, shopping? N -  Preparing Food and eating ? N -  Using the Toilet? N -  In the past six months, have you accidently leaked urine? N -  Do you have problems with loss of bowel control? N -  Managing your Medications? N -  Managing your Finances? Y -  Comment Wife manages finances -  Housekeeping or managing your Housekeeping? N -  Some recent data might be hidden    Patient Care Team: [name], [name] as PCP - General (Family Medicine) [name], MD as PCP - Cardiology (Cardiology) Vickie Epley, MD as PCP - Electrophysiology (Cardiology) De Hollingshead, RPH-CPP as Pharmacist (Pharmacist)  Indicate any recent Medical Services you may have received from other than [facility] providers in the past year (date may be approximate).      Assessment:   This is a routine wellness examination for [name] [name].  I connected with [name] [name] today by telephone and verified that I am speaking with the correct person using two identifiers. Location patient: home Location provider: work Persons participating in the virtual visit: patient, Marine scientist.    I discussed the limitations, risks, security and privacy concerns of performing an evaluation and management service by telephone and the availability of in person appointments. The patient expressed understanding and verbally consented to this telephonic visit.    Interactive audio and video telecommunications were attempted between this provider and patient, however failed, due to patient having technical difficulties OR patient did not have access to video capability.  We continued and completed visit with audio only.  Some vital signs may be absent or patient reported.   Hearing/Vision screen Hearing Screening - Comments:: Hearing aids Vision Screening - Comments:: Wears corrective lenses They have seen their ophthalmologist in the last 12 months.   Dietary issues and exercise activities discussed: Current Exercise Habits: Home exercise routine, Type of exercise: walking, Intensity: Mild   Goals Addressed             This Visit's Progress    DIET - INCREASE WATER INTAKE         Depression Screen PHQ 2/9 Scores 10/12/2020 10/12/2019 08/18/2019 07/27/2019 07/12/2019 05/17/2019 10/06/2018  PHQ - 2 Score 0 0 0 1 0 0 0  PHQ- 9 Score - - - 4 - - -    Fall Risk Fall Risk  10/12/2020 10/12/2019 08/18/2019 07/23/2019 07/12/2019  Falls in the past year? 0 0 0 0  0  Number falls in past yr: 0 0 0 - 0  Injury with Fall? - - 0 - 0  Follow up Falls evaluation completed Falls evaluation completed Falls evaluation completed - Falls evaluation completed    [place]: Adequate lighting in your home to reduce risk of falls? Yes   ASSISTIVE DEVICES UTILIZED TO PREVENT  FALLS: Use of a cane, walker or w/c? No   TIMED UP AND GO: Was the test performed? No .   Cognitive Function:  Patient is alert and oriented x3.    6CIT Screen 10/12/2020 10/06/2018 07/04/2017  What Year? 0 points 0 points 0 points  What month? 0 points 0 points 0 points  What time? 0 points 0 points 0 points  Count back from 20 - 0 points 0 points  Months in reverse 0 points 0 points 0 points  Repeat phrase - 0 points 0 points  Total Score - 0 0    Immunizations Immunization History  Administered Date(s) Administered   Influenza, High Dose Seasonal PF 11/01/2015, 10/20/2017, 09/28/2019   Influenza,inj,Quad PF,6+ Mos 10/08/2016   Influenza-Unspecified 10/01/2018   PFIZER(Purple Top)SARS-COV-2 Vaccination 02/04/2019, 02/25/2019, 10/12/2019   Shingrix vaccine- Due, Education has been provided regarding the importance of this vaccine. Advised may receive this vaccine at local pharmacy or Health Dept. Aware to provide a copy of the vaccination record if obtained from local pharmacy or Health Dept. Verbalized acceptance and understanding. Deferred.   Influenza vaccine- scheduled 10/13/20.   Health Maintenance Health Maintenance  Topic Date Due   COVID-19 Vaccine (4 - Booster for Pfizer series) 10/28/2020 (Originally 01/04/2020)   Zoster Vaccines- Shingrix (1 of 2) 01/11/2021 (Originally 01/19/1959)   INFLUENZA VACCINE  04/13/2021 (Originally 08/14/2020)   HEMOGLOBIN A1C  01/28/2021   FOOT EXAM  07/28/2021   OPHTHALMOLOGY EXAM  09/15/2021   TETANUS/TDAP  01/15/2023   HPV VACCINES  Aged Out   Colorectal cancer screening: No longer required.   Lung Cancer Screening: (Low Dose CT Chest recommended if Age 80-80 years, 30 pack-year currently smoking OR have quit w/in 15years.) Completed 07/28/20.    Hepatitis C Screening: does not qualify  Vision Screening: Recommended annual ophthalmology exams for early detection of glaucoma and other disorders of the eye.  Dental Screening:  Recommended annual dental exams for proper oral hygiene  Community Resource Referral / Chronic Care Management: CRR required this visit?  No   CCM required this visit?  No      Plan:   Keep all routine maintenance appointments.   I have personally reviewed and noted the following in the patient's chart:   Medical and social history Use of alcohol, tobacco or illicit drugs  Current medications and supplements including opioid prescriptions. Patient is currently taking opioid prescriptions. Information provided to patient regarding non-opioid alternatives. Patient advised to discuss non-opioid treatment plan with their provider. Followed by pcp. Functional ability and status Nutritional status Physical activity Advanced directives List of other physicians Hospitalizations, surgeries, and ER visits in previous 12 months Vitals Screenings to include cognitive, depression, and falls Referrals and appointments  In addition, I have reviewed and discussed with patient certain preventive protocols, quality metrics, and best practice recommendations. A written personalized care plan for preventive services as well as general preventive health recommendations were provided to patient via mychart.     OBrien-Blaney, Shontavia Mickel L, LPN   8/33/8250     I have reviewed the above information and agree with above.  Deborra Medina, MD

## 2020-10-12 NOTE — Patient Instructions (Addendum)
[name] [name] , Thank you for taking time to come for your Medicare Wellness Visit. I appreciate your ongoing commitment to your health goals. Please review the following plan we discussed and let me know if I can assist you in the future.   These are the goals we discussed:  Goals       Patient Stated     Medication Monitoring (pt-stated)      Patient Goals/Self-Care Activities Over the next 90 days, patient will:  - take medications as prescribed check glucose daily, document, and provide at future appointments check blood pressure daily, document, and provide at future appointments collaborate with provider on medication access solutions      Other     DIET - [place]        This is a list of the screening recommended for you and due dates:  Health Maintenance  Topic Date Due   COVID-19 Vaccine (4 - Booster for Pfizer series) 10/28/2020*   Zoster (Shingles) Vaccine (1 of 2) 01/11/2021*   Flu Shot  04/13/2021*   Hemoglobin A1C  01/28/2021   Complete foot exam   07/28/2021   Eye exam for diabetics  09/15/2021   Tetanus Vaccine  01/15/2023   HPV Vaccine  Aged Out  *Topic was postponed. The date shown is not the original due date.   Advanced directives: on file.  Conditions/risks identified: none new.  Next appointment: Follow up in one year for your annual wellness visit.   Preventive Care 63 Years and Older, Male Preventive care refers to lifestyle choices and visits with your health care provider that can promote health and wellness. What does preventive care include? A yearly physical exam. This is also called an annual well check. Dental exams once or twice a year. Routine eye exams. Ask your health care provider how often you should have your eyes checked. Personal lifestyle choices, including: Daily care of your teeth and gums. Regular physical activity. Eating a healthy diet. Avoiding tobacco and drug use. Limiting alcohol use. Practicing safe  sex. Taking low doses of aspirin every day. Taking vitamin and mineral supplements as recommended by your health care provider. What happens during an annual well check? The services and screenings done by your health care provider during your annual well check will depend on your age, overall health, lifestyle risk factors, and family history of disease. Counseling  Your health care provider may ask you questions about your: Alcohol use. Tobacco use. Drug use. Emotional well-being. Home and relationship well-being. Sexual activity. Eating habits. History of falls. Memory and ability to understand (cognition). Work and work Statistician. Screening  You may have the following tests or measurements: Height, weight, and BMI. Blood pressure. Lipid and cholesterol levels. These may be checked every 5 years, or more frequently if you are over 80 years old. Skin check. Lung cancer screening. You may have this screening every year starting at age 42 if you have a 30-pack-year history of smoking and currently smoke or have quit within the past 15 years. Fecal occult blood test (FOBT) of the stool. You may have this test every year starting at age 92. Flexible sigmoidoscopy or colonoscopy. You may have a sigmoidoscopy every 5 years or a colonoscopy every 10 years starting at age 20. Prostate cancer screening. Recommendations will vary depending on your family history and other risks. Hepatitis C blood test. Hepatitis B blood test. Sexually transmitted disease (STD) testing. Diabetes screening. This is done by checking your blood sugar (  glucose) after you have not eaten for a while (fasting). You may have this done every 1-3 years. Abdominal aortic aneurysm (AAA) screening. You may need this if you are a current or former smoker. Osteoporosis. You may be screened starting at age 40 if you are at high risk. Talk with your health care provider about your test results, treatment options, and if  necessary, the need for more tests. Vaccines  Your health care provider may recommend certain vaccines, such as: Influenza vaccine. This is recommended every year. Tetanus, diphtheria, and acellular pertussis (Tdap, Td) vaccine. You may need a Td booster every 10 years. Zoster vaccine. You may need this after age 70. Pneumococcal 13-valent conjugate (PCV13) vaccine. One dose is recommended after age 24. Pneumococcal polysaccharide (PPSV23) vaccine. One dose is recommended after age 67. Talk to your health care provider about which screenings and vaccines you need and how often you need them. This information is not intended to replace advice given to you by your health care provider. Make sure you discuss any questions you have with your health care provider. Document Released: 01/27/2015 Document Revised: 09/20/2015 Document Reviewed: 11/01/2014 Elsevier Interactive Patient Education  2017 [place] Prevention in the Home Falls can cause injuries. They can happen to people of all ages. There are many things you can do to make your home safe and to help prevent falls. What can I do on the outside of my home? Regularly fix the edges of walkways and driveways and fix any cracks. Remove anything that might make you trip as you walk through a door, such as a raised step or threshold. Trim any bushes or trees on the path to your home. Use bright outdoor lighting. Clear any walking paths of anything that might make someone trip, such as rocks or tools. Regularly check to see if handrails are loose or broken. Make sure that both sides of any steps have handrails. Any raised decks and porches should have guardrails on the edges. Have any leaves, snow, or ice cleared regularly. Use sand or salt on walking paths during winter. Clean up any spills in your garage right away. This includes oil or grease spills. What can I do in the bathroom? Use night lights. Install grab bars by the toilet  and in the tub and shower. Do not use towel bars as grab bars. Use non-skid mats or decals in the tub or shower. If you need to sit down in the shower, use a plastic, non-slip stool. Keep the floor dry. Clean up any water that spills on the floor as soon as it happens. Remove soap buildup in the tub or shower regularly. Attach bath mats securely with double-sided non-slip rug tape. Do not have throw rugs and other things on the floor that can make you trip. What can I do in the bedroom? Use night lights. Make sure that you have a light by your bed that is easy to reach. Do not use any sheets or blankets that are too big for your bed. They should not hang down onto the floor. Have a firm chair that has side arms. You can use this for support while you get dressed. Do not have throw rugs and other things on the floor that can make you trip. What can I do in the kitchen? Clean up any spills right away. Avoid walking on wet floors. Keep items that you use a lot in easy-to-reach places. If you need to reach something above you, use  a strong step stool that has a grab bar. Keep electrical cords out of the way. Do not use floor polish or wax that makes floors slippery. If you must use wax, use non-skid floor wax. Do not have throw rugs and other things on the floor that can make you trip. What can I do with my stairs? Do not leave any items on the stairs. Make sure that there are handrails on both sides of the stairs and use them. Fix handrails that are broken or loose. Make sure that handrails are as long as the stairways. Check any carpeting to make sure that it is firmly attached to the stairs. Fix any carpet that is loose or worn. Avoid having throw rugs at the top or bottom of the stairs. If you do have throw rugs, attach them to the floor with carpet tape. Make sure that you have a light switch at the top of the stairs and the bottom of the stairs. If you do not have them, ask someone to add  them for you. What else can I do to help prevent falls? Wear shoes that: Do not have high heels. Have rubber bottoms. Are comfortable and fit you well. Are closed at the toe. Do not wear sandals. If you use a stepladder: Make sure that it is fully opened. Do not climb a closed stepladder. Make sure that both sides of the stepladder are locked into place. Ask someone to hold it for you, if possible. Clearly mark and make sure that you can see: Any grab bars or handrails. First and last steps. Where the edge of each step is. Use tools that help you move around (mobility aids) if they are needed. These include: Canes. Walkers. Scooters. Crutches. Turn on the lights when you go into a dark area. Replace any light bulbs as soon as they burn out. Set up your furniture so you have a clear path. Avoid moving your furniture around. If any of your floors are uneven, fix them. If there are any pets around you, be aware of where they are. Review your medicines with your doctor. Some medicines can make you feel dizzy. This can increase your chance of falling. Ask your doctor what other things that you can do to help prevent falls. This information is not intended to replace advice given to you by your health care provider. Make sure you discuss any questions you have with your health care provider. Document Released: 10/27/2008 Document Revised: 06/08/2015 Document Reviewed: 02/04/2014 Elsevier Interactive Patient Education  2017 [place].  Opioid Pain Medicine Management Opioid pain medicines are strong medicines that are used to treat bad or very bad pain. When you take them for a short time, they can help you: Sleep better. Do better in physical therapy. Feel better during the first few days after you get hurt. Recover from surgery. Only take these medicines if a doctor says that you can. You should only take them for a short time. This is because opioids can be very addictive. This  means that they are hard to stop taking. The longer you take opioids, the harder it may be to stop taking them. What are the risks? Opioids can cause problems (side effects). Taking them for more than 3 days raises your chance of problems, such as: Trouble pooping (constipation). Feeling sick to your stomach (nausea). Vomiting. Feeling very sleepy. Confusion. Not being able to stop taking the medicine. Breathing problems. Taking opioids for a long time can make it  hard for you to do daily tasks. It can also put you at risk for: Car accidents. Depression. Suicide. Heart attack. Taking too much of the medicine (overdose). This can lead to death. What is a pain treatment plan? A pain treatment plan is a plan made by you and your doctor. Work with your doctor to make a plan for treating your pain. To help you do this: Talk about the goals of your treatment, including: How much pain you might expect to have. How you will manage the pain. Talk about the risks and benefits of taking these medicines for your condition. Remember that a good treatment plan uses more than one approach and lowers the risks of side effects. Tell your doctor about the amount of medicines you take and about any drug or alcohol use. Get your pain medicine prescriptions from only one doctor. Pain can be managed with other treatments. Work with your doctor to find other ways to help your pain, such as: Physical therapy or doing gentle exercises. Counseling. Eating healthy foods. Massage. Meditation. Other pain medicines. How to use opioid pain medicine safely Taking medicine Take your pain medicine exactly as told by your doctor. Take it only when you need it. If your pain is not too bad, you may take less medicine if your doctor allows. If you have no pain, do not take the medicine unless your doctor tells you to take it. If your pain is very bad, do not take more medicine than your doctor told you to take. Call  your doctor to know what to do. Write down the times when you take your pain medicine. Look at the times before you take your next dose. Take other over-the-counter or prescription medicines only as told by your doctor. Keeping yourself and others safe  While you are taking opioids: Do not drive, use machines, or power tools. Do not sign important papers (legal documents). Do not drink alcohol. Do not take sleeping pills. Do not take care of children by yourself. Do not do activities where you need to climb or be in high places, like working on a ladder. Do not go to a lake, river, ocean, swimming pool, or hot tub. Keep your opioids locked up or in a place where children cannot reach them. Do not share your pain medicine with anyone. Stopping your use of opioids If you have been taking opioids for more than a few weeks, you may need to slowly decrease (taper) how much you take until you stop taking them. Doing this can lower your chance of having symptoms.  Symptoms that come from suddenly stopping the use of opioids include: Pain and cramping in your belly (abdomen). Feeling sick to your stomach (nausea).z Sweating. Feeling very sleepy. Feeling restless. Shaking you cannot control (tremors). Cravings for the medicine. Do not try to stop taking them by yourself. Work with your doctor to stop. Your doctor will help you take less until you are not taking the medicine at all. Getting rid of unused pills Do not save any pills that you did not use. Get rid of the pills by: Taking them to a take-back program in your area. Bringing them to a pharmacy that receives unused pills. Flushing them down the toilet. Check the label or package insert of your medicine to see whether this is safe to do. Throwing them in the trash. Check the label or package insert of your medicine to see whether this is safe to do. If it is safe  to throw them out: Take the pills out of their container. Put the pills  into a container you can seal. Mix the pills with used coffee grounds, food scraps, dirt, or cat litter. Put this in the trash. Follow these instructions at home: Activity Do exercises as told by your doctor. Avoid doing things that make your pain worse. Return to your normal activities as told by your doctor. Ask your doctor what activities are safe for you. General instructions You may need to take these actions to prevent or treat constipation: Drink enough fluid to keep your pee (urine) pale yellow. Take over-the-counter or prescription medicines. Eat foods that are high in fiber. These include beans, whole grains, and fresh fruits and vegetables. Limit foods that are high in fat and sugar. These include fried or sweet foods. Keep all follow-up visits. Where to find support If you have been taking opioids for a long time, get help from a local support group or counselor. Ask your doctor about this. Where to find more information Centers for Disease Control and Prevention (CDC): http://www.wolf.info/ [place] Food and Drug Administration (FDA): [place] Get help right away if: You may have taken too much of an opioid (overdosed). Common symptoms of an overdose: Your breathing is slower or more shallow than normal. You have a very slow heartbeat. Your speech is not normal. You vomit or you feel as if you may vomit. The black centers of your eyes (pupils) are smaller than normal. You have other potential symptoms: You feel very confused. You faint. You are very sleepy. You have cold skin. You have blue lips or fingernails. You have thoughts of harming yourself or harming others. These symptoms may be an emergency. Get help right away. Call your local emergency services (911 in the [place] Do not wait to see if the symptoms will go away. Do not drive yourself to the hospital. Get help right away if you feel like you may hurt yourself or others, or have thoughts about taking your own life. Go  to your nearest emergency room or: Call your local emergency services (911 in the [place] Call the [facility] at [phone]. Call a suicide crisis helpline, such as the [facility] at 747-145-7718. This is open 24 hours a day. Text the Crisis Text Line at 223-009-1495. Summary Opioid are strong medicines that are used to treat bad or very bad pain. A pain treatment plan is a plan made by you and your doctor. Work with your doctor to make a plan for treating your pain. If you think that you or someone else may have taken too much of an opioid, get help right away. This information is not intended to replace advice given to you by your health care provider. Make sure you discuss any questions you have with your health care provider. Document Revised: 04/12/2020 Document Reviewed: 04/12/2020 Elsevier Patient Education  [place].

## 2020-10-13 ENCOUNTER — Other Ambulatory Visit (INDEPENDENT_AMBULATORY_CARE_PROVIDER_SITE_OTHER): Payer: Medicare Other

## 2020-10-13 ENCOUNTER — Ambulatory Visit (INDEPENDENT_AMBULATORY_CARE_PROVIDER_SITE_OTHER): Payer: Medicare Other

## 2020-10-13 ENCOUNTER — Encounter: Payer: Self-pay | Admitting: Family

## 2020-10-13 ENCOUNTER — Other Ambulatory Visit: Payer: Self-pay

## 2020-10-13 DIAGNOSIS — G2581 Restless legs syndrome: Secondary | ICD-10-CM | POA: Diagnosis not present

## 2020-10-13 DIAGNOSIS — I25118 Atherosclerotic heart disease of native coronary artery with other forms of angina pectoris: Secondary | ICD-10-CM

## 2020-10-13 DIAGNOSIS — E118 Type 2 diabetes mellitus with unspecified complications: Secondary | ICD-10-CM

## 2020-10-13 DIAGNOSIS — Z23 Encounter for immunization: Secondary | ICD-10-CM

## 2020-10-13 DIAGNOSIS — I10 Essential (primary) hypertension: Secondary | ICD-10-CM

## 2020-10-13 DIAGNOSIS — E782 Mixed hyperlipidemia: Secondary | ICD-10-CM | POA: Diagnosis not present

## 2020-10-13 DIAGNOSIS — E1165 Type 2 diabetes mellitus with hyperglycemia: Secondary | ICD-10-CM

## 2020-10-13 DIAGNOSIS — N400 Enlarged prostate without lower urinary tract symptoms: Secondary | ICD-10-CM

## 2020-10-13 LAB — IBC + FERRITIN
Ferritin: 151.1 ng/mL (ref 22.0–322.0)
Iron: 113 ug/dL (ref 42–165)
Saturation Ratios: 27.5 % (ref 20.0–50.0)
TIBC: 410.2 ug/dL (ref 250.0–450.0)
Transferrin: 293 mg/dL (ref 212.0–360.0)

## 2020-10-13 LAB — CBC WITH DIFFERENTIAL/PLATELET
Basophils Absolute: 0 10*3/uL (ref 0.0–0.1)
Basophils Relative: 0.6 % (ref 0.0–3.0)
Eosinophils Absolute: 0.2 10*3/uL (ref 0.0–0.7)
Eosinophils Relative: 2.6 % (ref 0.0–5.0)
HCT: 42.8 % (ref 39.0–52.0)
Hemoglobin: 14.5 g/dL (ref 13.0–17.0)
Lymphocytes Relative: 29.9 % (ref 12.0–46.0)
Lymphs Abs: 1.7 10*3/uL (ref 0.7–4.0)
MCHC: 33.8 g/dL (ref 30.0–36.0)
MCV: 91.9 fl (ref 78.0–100.0)
Monocytes Absolute: 0.4 10*3/uL (ref 0.1–1.0)
Monocytes Relative: 7.5 % (ref 3.0–12.0)
Neutro Abs: 3.5 10*3/uL (ref 1.4–7.7)
Neutrophils Relative %: 59.4 % (ref 43.0–77.0)
Platelets: 156 10*3/uL (ref 150.0–400.0)
RBC: 4.65 Mil/uL (ref 4.22–5.81)
RDW: 15 % (ref 11.5–15.5)
WBC: 5.8 10*3/uL (ref 4.0–10.5)

## 2020-10-16 ENCOUNTER — Telehealth: Payer: Self-pay | Admitting: Cardiology

## 2020-10-16 NOTE — Telephone Encounter (Signed)
This is a [place] pt 

## 2020-10-17 NOTE — Telephone Encounter (Signed)
*  STAT* If patient is at the pharmacy, call can be transferred to refill team.   1. Which medications need to be refilled? (please list name of each medication and dose if known) rosuvastatin 20 MG 1 tablet daily  2. Which pharmacy/location (including street and city if local pharmacy) is medication to be sent to? [facility] on [address]  3. Do they need a 30 day or 90 day supply? 90 day

## 2020-10-17 NOTE — Telephone Encounter (Signed)
Per patients medication list -patient is taken Crestor 10Mg .  Per patient he confirmed with his bottles that he currently takes Crestor 20MG  daily.   Please advise if this dose is okay to update in patients chart and refill.

## 2020-10-19 NOTE — Telephone Encounter (Signed)
Patient calling to check on status of refill - will be out soon

## 2020-10-20 MED ORDER — ROSUVASTATIN CALCIUM 10 MG PO TABS: 10.0000 mg | ORAL_TABLET | Freq: Every day | ORAL | 0 refills | Status: DC

## 2020-10-20 NOTE — Telephone Encounter (Signed)
Requested Prescriptions   Signed Prescriptions Disp Refills   rosuvastatin (CRESTOR) 10 MG tablet 90 tablet 0    Sig: Take 1 tablet (10 mg total) by mouth daily.    Authorizing Provider: Vickie Epley    Ordering User: Britt Bottom   Refused Prescriptions Disp Refills   rosuvastatin (CRESTOR) 20 MG tablet [Pharmacy Med Name: ROSUVASTATIN 20MG  TABLETS] 90 tablet 3    Sig: TAKE 1 TABLET(20 MG) BY MOUTH DAILY    Refused By: Raelene Bott, BRANDY L    Reason for Refusal: Refill not appropriate

## 2020-10-20 NOTE — Addendum Note (Signed)
Addended by: Britt Bottom on: 10/20/2020 09:10 AM   Modules accepted: Orders

## 2020-10-21 ENCOUNTER — Other Ambulatory Visit: Payer: Self-pay | Admitting: Urology

## 2020-10-21 DIAGNOSIS — N200 Calculus of kidney: Secondary | ICD-10-CM

## 2020-10-24 ENCOUNTER — Encounter: Payer: Self-pay | Admitting: *Deleted

## 2020-10-24 DIAGNOSIS — R911 Solitary pulmonary nodule: Secondary | ICD-10-CM

## 2020-10-26 ENCOUNTER — Other Ambulatory Visit: Payer: Self-pay

## 2020-10-26 ENCOUNTER — Ambulatory Visit
Admission: RE | Admit: 2020-10-26 | Discharge: 2020-10-26 | Disposition: A | Discharge status: Home / Self Care | Payer: Medicare Other | Source: Ambulatory Visit | Attending: Nurse Practitioner | Admitting: Nurse Practitioner

## 2020-10-26 DIAGNOSIS — I700 Atherosclerosis of aorta: Secondary | ICD-10-CM | POA: Diagnosis not present

## 2020-10-26 DIAGNOSIS — R911 Solitary pulmonary nodule: Secondary | ICD-10-CM | POA: Diagnosis not present

## 2020-10-26 DIAGNOSIS — R918 Other nonspecific abnormal finding of lung field: Secondary | ICD-10-CM | POA: Diagnosis not present

## 2020-10-27 ENCOUNTER — Inpatient Hospital Stay: Payer: Medicare Other | Attending: Nurse Practitioner | Admitting: Nurse Practitioner

## 2020-10-27 DIAGNOSIS — Z87891 Personal history of nicotine dependence: Secondary | ICD-10-CM | POA: Insufficient documentation

## 2020-10-27 DIAGNOSIS — R911 Solitary pulmonary nodule: Secondary | ICD-10-CM | POA: Insufficient documentation

## 2020-10-27 NOTE — Progress Notes (Signed)
Pulmonary Nodule Clinic Consult Note [facility]  [phone] [phone]  Patient Care Team: [name], [name] as PCP - General (Family Medicine) [name], MD as PCP - Cardiology (Cardiology) Vickie Epley, MD as PCP - Electrophysiology (Cardiology) De Hollingshead, RPH-CPP as Pharmacist (Pharmacist)   Name of the patient: [name] [name]  [id number]  1940/07/29   Date of visit: 10/27/2020   Diagnosis- Lung Nodule  Chief complaint/ Reason for visit- Pulmonary Nodule Clinic Initial Visit  Past Medical History:  Patient is managed/referred by [name], [name] for incidental pulmonary nodule. He was experiencing abdominal pain, CT abdomen pelvis was obtained and new small bibasilar subpleural nodular density was seen measuring up to 6 mm at the left lung base. This was followed up by CT Chest WO Contrast on 07/31/20 which showed: subpleural left lower lobe nodule measuring 1.0 cm. Findings thought to be unchanged since previous imaging and possibly representative of infectious, inflammatory, or possible aspiration.   Interval history- Patient is 80 year old male who presents to clinic for evaluation of pulmonary nodule and discussion of recent CT results. He feels well. Denies unintentional weight loss, fevers, chills, cough, chest pain, hemoptysis. No nausea, vomiting, abdominal pain. No bone pain. No headaches or vision changes. Appetite is good and he denies weiight loss. No urinary complaints. No further specific complaints today.   ECOG FS:0 - Asymptomatic  Review of systems- Review of Systems  Constitutional:  Negative for chills, diaphoresis, fever, malaise/fatigue and weight loss.  HENT:  Negative for congestion, ear discharge, ear pain, nosebleeds, sinus pain and sore throat.   Eyes:  Negative for pain and redness.  Respiratory:  Negative for cough, hemoptysis, sputum production, shortness of breath, wheezing  and stridor.   Cardiovascular:  Negative for chest pain, palpitations and leg swelling.  Gastrointestinal:  Negative for abdominal pain, constipation, diarrhea, nausea and vomiting.  Genitourinary:  Negative for dysuria, flank pain, frequency, hematuria and urgency.  Musculoskeletal:  Negative for falls, joint pain and myalgias.  Skin:  Negative for rash.  Neurological:  Negative for dizziness, loss of consciousness, weakness and headaches.  Psychiatric/Behavioral:  Negative for depression. The patient is not nervous/anxious and does not have insomnia.   All other systems reviewed and are negative.   Allergies  Allergen Reactions   Metformin And Related Diarrhea and Other (See Comments)    Leg cramps, also   Azithromycin Other (See Comments)    Not recommended - no reaction   Other Other (See Comments)    If taking antibiotics for awhile, thrush results   Percocet [Oxycodone-Acetaminophen] Other (See Comments)    Hallucinations   Jardiance [Empagliflozin] Other (See Comments)    Tongue itching/reaction     Past Medical History:  Diagnosis Date   Aortic insufficiency    a. noted on TTE 2015; b. 06/2019 Echo: AI not visualized.   Arthritis    CAD (coronary artery disease)    a. remote PCI in 1991 and 2005; b. MV 3/15: old inferior MI, no ischemia, LVEF 50%, slight inferior wall hypokniesis; c. 06/2019 NSTEMI/PCI: LM nl, LAD 80p/m (Atherectomy & 4.5x18 Resolute Onyx DES), 11m/d, D1 75 (PTCA), RI patent stent, LCX nl, RCA 100p, RPAV fills via L->R collats from LCX.   Chicken pox    Colon polyps    4 pre-cancerous    Diverticulitis    DM type 2 (diabetes mellitus, type 2) (HCC)    Family history of adverse reaction to anesthesia  GERD (gastroesophageal reflux disease)    Heart murmur    History of kidney stones    HOH (hard of hearing)    Hyperlipidemia    Hypertension    Ischemic cardiomyopathy    a. TTE 2015: EF  50-55%, mild global HK; b. 06/2019 Echo: EF 45-50%, Gr1 DD,  basal inf AK. Triv MR.   Kidney stones    Melanoma ([place]) 1980   Resected from his back   Mitral regurgitation    a. noted on TTE 2015   Myocardial infarction [facility])      Past Surgical History:  Procedure Laterality Date   CHOLECYSTECTOMY  2012   COLONOSCOPY     in 2003 with polyp removed and leak anastomosis had to have open abdominal surgery    [place] & 2005   CORONARY ATHERECTOMY N/A 07/08/2019   Procedure: CORONARY ATHERECTOMY;  Surgeon: Jettie Booze, MD;  Location: [place] [facility];  Service: Cardiovascular;  Laterality: N/A;   CORONARY BALLOON ANGIOPLASTY N/A 07/08/2019   Procedure: CORONARY BALLOON ANGIOPLASTY;  Surgeon: Jettie Booze, MD;  Location: [place] [facility];  Service: Cardiovascular;  Laterality: N/A;  diagonal    CORONARY STENT INTERVENTION N/A 07/08/2019   Procedure: CORONARY STENT INTERVENTION;  Surgeon: Jettie Booze, MD;  Location: [place] [facility];  Service: Cardiovascular;  Laterality: N/A;  lad   CYSTOSCOPY/URETEROSCOPY/HOLMIUM LASER/STENT PLACEMENT Right 01/02/2018   Procedure: CYSTOSCOPY/URETEROSCOPY/HOLMIUM LASER/STENT PLACEMENT;  Surgeon: [name], MD;  Location: [facility];  Service: Urology;  Laterality: Right;   ESOPHAGOGASTRODUODENOSCOPY (EGD) WITH PROPOFOL N/A 11/19/2016   Procedure: ESOPHAGOGASTRODUODENOSCOPY (EGD) WITH PROPOFOL;  Surgeon: [name], MD;  Location: [facility];  Service: Endoscopy;  Laterality: N/A;   ESOPHAGOGASTRODUODENOSCOPY (EGD) WITH PROPOFOL N/A 02/02/2019   Procedure: ESOPHAGOGASTRODUODENOSCOPY (EGD) WITH PROPOFOL;  Surgeon: [name], MD;  Location: [facility];  Service: Endoscopy;  Laterality: N/A;   ICD IMPLANT N/A 02/02/2020   Procedure: ICD IMPLANT;  Surgeon: Vickie Epley, MD;  Location: [place] [facility];  Service: Cardiovascular;  Laterality: N/A;   INTRAVASCULAR PRESSURE WIRE/FFR STUDY N/A 07/07/2019   Procedure: INTRAVASCULAR  PRESSURE WIRE/FFR STUDY;  Surgeon: Nelva Bush, MD;  Location: [place] [facility];  Service: Cardiovascular;  Laterality: N/A;   INTRAVASCULAR ULTRASOUND/IVUS N/A 07/08/2019   Procedure: Intravascular Ultrasound/IVUS;  Surgeon: Jettie Booze, MD;  Location: [place] [facility];  Service: Cardiovascular;  Laterality: N/A;   LEAD REVISION/REPAIR N/A 02/16/2020   Procedure: LEAD REVISION/REPAIR;  Surgeon: Constance Haw, MD;  Location: [place] [facility];  Service: Cardiovascular;  Laterality: N/A;   LEFT HEART CATH N/A 07/08/2019   Procedure: Left Heart Cath;  Surgeon: Jettie Booze, MD;  Location: [place] [facility];  Service: Cardiovascular;  Laterality: N/A;   LEFT HEART CATH AND CORONARY ANGIOGRAPHY N/A 07/07/2019   Procedure: LEFT HEART CATH AND CORONARY ANGIOGRAPHY;  Surgeon: Nelva Bush, MD;  Location: [place] [facility];  Service: Cardiovascular;  Laterality: N/A;   LITHOTRIPSY  2015   MELANOMA EXCISION  1980   malignant   NERVE SURGERY  2015   ulna nerve   TONSILLECTOMY  1945   WISDOM TOOTH EXTRACTION      Social History   Socioeconomic History   Marital status: Married    Spouse name: Golden Circle   Number of children: Not on file   Years of education: Not on file   Highest education level: Not on file  Occupational History  Not on file  Tobacco Use   Smoking status: Some Days    Types: Pipe   Smokeless tobacco: Former    Quit date: 10/17/2015  Vaping Use   Vaping Use: Never used  Substance and Sexual Activity   Alcohol use: Not Currently    Alcohol/week: 0.0 - 1.0 standard drinks   Drug use: No   Sexual activity: Not on file  Other Topics Concern   Not on file  Social History Narrative   Married    Social Determinants of Health   Financial Resource Strain: Medium Risk   Difficulty of Paying Living Expenses: Somewhat hard  Food Insecurity: No Food Insecurity   Worried About Charity fundraiser in the Last Year: Never true   Ran Out of  Food in the Last Year: Never true  Transportation Needs: No Transportation Needs   Lack of Transportation (Medical): No   Lack of Transportation (Non-Medical): No  Physical Activity: Insufficiently Active   Days of Exercise per Week: 3 days   Minutes of Exercise per Session: 30 min  Stress: No Stress Concern Present   Feeling of Stress : Not at all  Social Connections: Unknown   Frequency of Communication with Friends and Family: Not on file   Frequency of Social Gatherings with Friends and Family: Not on file   Attends Religious Services: Not on Electrical engineer or Organizations: Not on file   Attends Archivist Meetings: Not on file   Marital Status: Married  Human resources officer Violence: Not At Risk   Fear of Current or Ex-Partner: No   Emotionally Abused: No   Physically Abused: No   Sexually Abused: No    Family History  Problem Relation Age of Onset   Hyperlipidemia Mother    Hypertension Mother    Heart disease Mother    Diabetes Mother    Heart attack Mother    Colon cancer Father        metasized to liver, adrenal, lungs   Lung cancer Father    Kidney cancer Father        malignant capsulated kidney tumor   Diabetes Father    Liver cancer Father    Bladder Cancer Neg Hx    Prostate cancer Neg Hx      Current Outpatient Medications:    acetaminophen (TYLENOL) 500 MG tablet, Take 1,000 mg by mouth every 6 (six) hours as needed (pain.)., Disp: , Rfl:    amiodarone (PACERONE) 200 MG tablet, Take 200 mg by mouth daily., Disp: , Rfl:    aspirin EC 81 MG tablet, Take 1 tablet (81 mg total) by mouth daily. Swallow whole., Disp: 90 tablet, Rfl: 3   carvedilol (COREG) 6.25 MG tablet, Take 6.25 mg by mouth daily., Disp: , Rfl:    clopidogrel (PLAVIX) 75 MG tablet, TAKE 1 TABLET(75 MG) BY MOUTH DAILY WITH BREAKFAST, Disp: 60 tablet, Rfl: 3   ezetimibe (ZETIA) 10 MG tablet, TAKE 1 TABLET(10 MG) BY MOUTH DAILY, Disp: 90 tablet, Rfl: 1   famotidine  (PEPCID) 20 MG tablet, Take 20 mg by mouth daily., Disp: , Rfl:    fluticasone (FLONASE) 50 MCG/ACT nasal spray, Place into both nostrils daily as needed for allergies or rhinitis., Disp: , Rfl:    glipiZIDE (GLUCOTROL) 10 MG tablet, Take glipizide 5mg  qam breakfast, and 10mg  qpm with supper., Disp: 120 tablet, Rfl: 3   lisinopril (ZESTRIL) 10 MG tablet, Take 10 mg by mouth daily., Disp: ,  Rfl:    midodrine (PROAMATINE) 5 MG tablet, Take 1 tablet (5 mg total) by mouth 2 (two) times daily as needed. Take when BP does drop less than 110 when standing. Additional dose at 2 pm if still dropping below 110 when standing, Disp: 90 tablet, Rfl: 3   Multiple Vitamin (MULTIVITAMIN) tablet, Take 1 tablet by mouth daily., Disp: , Rfl:    nitroGLYCERIN (NITROSTAT) 0.4 MG SL tablet, Place 1 tablet (0.4 mg total) under the tongue every 5 (five) minutes x 3 doses as needed for chest pain., Disp: 25 tablet, Rfl: 0   Omega-3 Fatty Acids (FISH OIL) 1000 MG CAPS, Take 1 capsule by mouth daily., Disp: , Rfl:    ONETOUCH VERIO test strip, USE TO CHECK BLOOD SUGAR TWICE DAILY, Disp: 100 strip, Rfl: 1   pantoprazole (PROTONIX) 40 MG tablet, Take 1 tablet (40 mg total) by mouth daily., Disp: 30 tablet, Rfl: 6   Polyvinyl Alcohol-Povidone (MURINE TEARS FOR DRY EYES OP), Place 1-2 drops into both eyes as needed (for dry eyes). , Disp: , Rfl:    psyllium (METAMUCIL) 58.6 % powder, Take 1 packet by mouth at bedtime., Disp: , Rfl:    rosuvastatin (CRESTOR) 10 MG tablet, Take 1 tablet (10 mg total) by mouth daily., Disp: 90 tablet, Rfl: 0   Semaglutide,0.25 or 0.5MG /DOS, (OZEMPIC, 0.25 OR 0.5 MG/DOSE,) 2 MG/1.5ML SOPN, Inject 0.5 mg into the skin once a week., Disp: 3 mL, Rfl: 2   sodium chloride (OCEAN) 0.65 % SOLN nasal spray, Place 1 spray into both nostrils as needed for congestion., Disp: , Rfl:    spironolactone (ALDACTONE) 25 MG tablet, Take 25 mg by mouth daily., Disp: , Rfl:    tamsulosin (FLOMAX) 0.4 MG CAPS capsule,  TAKE ONE CAPSULE BY MOUTH DAILY, Disp: 90 capsule, Rfl: 3   traMADol (ULTRAM) 50 MG tablet, TAKE 1 TABLET BY MOUTH EVERY 12 HOURS AS NEEDED, Disp: 60 tablet, Rfl: 2  Physical exam: There were no vitals filed for this visit. Physical Exam Constitutional:      General: He is not in acute distress.    Appearance: He is well-developed.  HENT:     Head: Normocephalic and atraumatic.     Nose: Nose normal.     Mouth/Throat:     Pharynx: No oropharyngeal exudate.  Eyes:     General: No scleral icterus.    Conjunctiva/sclera: Conjunctivae normal.  Cardiovascular:     Rate and Rhythm: Normal rate and regular rhythm.     Heart sounds: Normal heart sounds.  Pulmonary:     Effort: Pulmonary effort is normal. No respiratory distress.     Breath sounds: Normal breath sounds. No wheezing.  Abdominal:     General: Bowel sounds are normal. There is no distension.     Palpations: Abdomen is soft.     Tenderness: There is no abdominal tenderness.  Musculoskeletal:        General: Normal range of motion.     Cervical back: Normal range of motion and neck supple.  Skin:    General: Skin is warm and dry.  Neurological:     Mental Status: He is alert and oriented to person, place, and time.  Psychiatric:        Behavior: Behavior normal.     CMP Latest Ref Rng & Units 08/02/2020  Glucose 65 - 99 mg/dL 202(H)  BUN 8 - 27 mg/dL 22  Creatinine 0.76 - 1.27 mg/dL 0.97  Sodium 134 - 144 mmol/L  141  Potassium 3.5 - 5.2 mmol/L 5.1  Chloride 96 - 106 mmol/L 103  CO2 20 - 29 mmol/L 22  Calcium 8.6 - 10.2 mg/dL 9.0  Total Protein 6.0 - 8.5 g/dL 6.6  Total Bilirubin 0.0 - 1.2 mg/dL 1.0  Alkaline Phos 44 - 121 IU/L 70  AST 0 - 40 IU/L 37  ALT 0 - 44 IU/L 37   CBC Latest Ref Rng & Units 10/13/2020  WBC 4.0 - 10.5 K/uL 5.8  Hemoglobin 13.0 - 17.0 g/dL 14.5  Hematocrit 39.0 - 52.0 % 42.8  Platelets 150.0 - 400.0 K/uL 156.0    No images are attached to the encounter.  CT Chest Wo  Contrast  Result Date: 10/27/2020 CLINICAL DATA:  Pulmonary nodules. EXAM: CT CHEST WITHOUT CONTRAST TECHNIQUE: Multidetector CT imaging of the chest was performed following the standard protocol without IV contrast. COMPARISON:  July 28, 2020. FINDINGS: Cardiovascular: Atherosclerosis of thoracic aorta is noted without aneurysm formation. Normal cardiac size. No pericardial effusion. Coronary artery calcifications are noted. Mediastinum/Nodes: No enlarged mediastinal or axillary lymph nodes. Thyroid gland, trachea, and esophagus demonstrate no significant findings. Lungs/Pleura: No pneumothorax or pleural effusion is noted. Nodular density seen in left posterior costophrenic sulcus on prior exam has resolved. Stable 5 mm sub solid nodule is seen in the right lower lobe best seen on image number 111 of series 3. Minimal bibasilar subsegmental atelectasis or scarring is noted posteriorly. Upper Abdomen: No acute abnormality. Musculoskeletal: No chest wall mass or suspicious bone lesions identified. IMPRESSION: Nodular density seen in left posterior costophrenic sulcus on prior exam has resolved. However, stable 5 mm sub solid nodule is seen in right lower lobe. Follow-up unenhanced chest CT in 12 months is recommended to ensure stability. Extensive coronary artery calcifications are noted suggesting coronary artery disease. Aortic Atherosclerosis (ICD10-I70.0). Electronically Signed   By: Marijo Conception M.D.   On: 10/27/2020 11:54     Assessment and plan- Patient is a 80 y.o. male who presents to pulmonary nodule clinic for follow-up of incidental lung nodules.     CT chest without contrast from today 10/27/2020 was independently reviewed and revealed: nodular density in left lower lobe has resolved. He has a stable 5 mm sub solid nodule in the right lower lobe. Extensive coronary artery calcifications and aortic atherosclerosis.     Calculating malignancy probability of a pulmonary nodule: Risk factors  include: 1.  Age. 2.  Cancer history. 3.  Diameter of pulmonary nodule and mm 4.  Location 5.  Smoking history 6.  Spiculation present   Based on risk factors, this patient is high risk for the development of lung cancer even in absence of a lung nodule.  I would recommend a 32-month follow-up with imaging to ensure stability given smoking history.  If imaging stable in 39-months, he can be released from follow up. He is at the upper limit of recommended low dose CT screening.     During our visit, we discussed pulmonary nodules are a common incidental finding and are often how lung cancer is discovered.  Lung cancer survival is directly related to the stage at diagnosis.  We discussed that nodules can vary in presentation from solitary pulmonary nodules to masses, 2 groundglass opacities and multiple nodules.  Pulmonary nodules in the majority of cases are benign but the probability of these becoming malignant cannot be undermined.  Early identification of malignant nodules could lead to early diagnosis and increased survival.   We discussed the probability  of pulmonary nodules becoming malignant increase with age, pack years of tobacco use, size/characteristics of the nodule and location; with upper lobe involvement being most worrisome.   We discussed the goal of our clinic is to thoroughly evaluate each nodule, developed a comprehensive, individualized plan of care utilizing the most advanced technology and significantly reduce the time from detection to treatment.  A dedicated pulmonary nodule clinic has proven to indeed expedite the detection and treatment of lung cancer.   Patient education in fact sheet provided along with most recent CT scans.  Disposition: RTC in 1 year for CT Chest WO Contrast then day or 2 later, see NP for results   Visit Diagnosis 1. Incidental lung nodule, > 12mm and < 45mm    Patient expressed understanding and was in agreement with this plan. He also  understands that He can call clinic at any time with any questions, concerns, or complaints.   Greater than 50% was spent in counseling and coordination of care with this patient including but not limited to discussion of the relevant topics above (See A&P) including, but not limited to diagnosis and management of acute and chronic medical conditions.   Thank you for allowing me to participate in the care of this very pleasant patient.   Verlon Au, NP [facility] 10/27/2020   CC: [name], [name]

## 2020-10-30 ENCOUNTER — Other Ambulatory Visit: Payer: Self-pay

## 2020-10-30 ENCOUNTER — Encounter: Payer: Self-pay | Admitting: Family

## 2020-10-30 ENCOUNTER — Ambulatory Visit (INDEPENDENT_AMBULATORY_CARE_PROVIDER_SITE_OTHER): Payer: Medicare Other | Admitting: Family

## 2020-10-30 VITALS — BP 112/64 | HR 60 | Temp 98.0°F | Ht 73.0 in | Wt 208.0 lb

## 2020-10-30 DIAGNOSIS — M545 Low back pain, unspecified: Secondary | ICD-10-CM

## 2020-10-30 DIAGNOSIS — I10 Essential (primary) hypertension: Secondary | ICD-10-CM

## 2020-10-30 DIAGNOSIS — I25118 Atherosclerotic heart disease of native coronary artery with other forms of angina pectoris: Secondary | ICD-10-CM

## 2020-10-30 DIAGNOSIS — E118 Type 2 diabetes mellitus with unspecified complications: Secondary | ICD-10-CM

## 2020-10-30 DIAGNOSIS — E1165 Type 2 diabetes mellitus with hyperglycemia: Secondary | ICD-10-CM | POA: Diagnosis not present

## 2020-10-30 DIAGNOSIS — G8929 Other chronic pain: Secondary | ICD-10-CM | POA: Diagnosis not present

## 2020-10-30 LAB — POCT GLYCOSYLATED HEMOGLOBIN (HGB A1C): Hemoglobin A1C: 6.4 % — AB (ref 4.0–5.6)

## 2020-10-30 MED ORDER — LISINOPRIL 10 MG PO TABS: 5.0000 mg | ORAL_TABLET | Freq: Every day | ORAL | 1 refills | Status: DC

## 2020-10-30 MED ORDER — CARVEDILOL 6.25 MG PO TABS: 6.2500 mg | ORAL_TABLET | Freq: Two times a day (BID) | ORAL | 1 refills | Status: DC

## 2020-10-30 NOTE — Assessment & Plan Note (Signed)
Chronic stable, continue clopidogrel 75 mg daily, aspirin 81 mg

## 2020-10-30 NOTE — Assessment & Plan Note (Signed)
Lab Results  Component Value Date   HGBA1C 6.4 (A) 10/30/2020  Excellent control.  Continue glipizide to 5 mg, continue 10 mg with supper, and resume Ozempic 0.5 mg weekly

## 2020-10-30 NOTE — Assessment & Plan Note (Addendum)
Chronic stable. Continue  lisinopril 5 mg, carvedilol 6.25 mg twice daily, spironolactone 25 mg every morning. Declines BMP lab to monitor electrolyte this morning. He would like to have labs done with cardiology.

## 2020-10-30 NOTE — Progress Notes (Signed)
Subjective:    Patient ID: [name] [name], male    DOB: [birth year], 80 y.o.   MRN: [id number]  CC: [name] [name] is a 80 y.o. male who presents today for follow up.   HPI: Leg pain has resolved. Feels well today  No new complaints   DM- compliant with glipizide to 5 mg, continue 10 mg with supper, and resume Ozempic 0.5 mg weekly. Had been on ozempic 1mg  which caused fasting blood glucose of 88.  No longer having near hypoglycemic episodes. Now FBG  123.  He has noticed appetite has increased. His weight has increased.  History of ventricular tachycardia-compliant with amiodarone.  Following with Dr. Quentin Ore.  Last seen July of this year HTN- compliant with lisinopril 5 mg, carvedilol 6.25 mg twice daily, spironolactone 25 mg every morning Hyperlipidemia-compliant with atorvastatin 10 mg, Zetia 10 mg Antiplatelet regimen compliant with clopidogrel 75 mg daily, aspirin 81 mg.  No bleeding Chronic pain of neck and low back- This is unchanged. compliant with tramadol 50 mg twice daily which is working well. He takes tylenol with relief on occasion with relief.  Consult pulmonology 10/27/2020 regarding incidental lung nodule.  At this time, note is incomplete.  CT chest without contrast to schedule for 10/24/2021   HISTORY:  Past Medical History:  Diagnosis Date   Aortic insufficiency    a. noted on TTE 2015; b. 06/2019 Echo: AI not visualized.   Arthritis    CAD (coronary artery disease)    a. remote PCI in 1991 and 2005; b. MV 3/15: old inferior MI, no ischemia, LVEF 50%, slight inferior wall hypokniesis; c. 06/2019 NSTEMI/PCI: LM nl, LAD 80p/m (Atherectomy & 4.5x18 Resolute Onyx DES), 20m/d, D1 75 (PTCA), RI patent stent, LCX nl, RCA 100p, RPAV fills via L->R collats from LCX.   Chicken pox    Colon polyps    4 pre-cancerous    Diverticulitis    DM type 2 (diabetes mellitus, type 2) (HCC)    Family history of adverse reaction to anesthesia    GERD (gastroesophageal reflux  disease)    Heart murmur    History of kidney stones    HOH (hard of hearing)    Hyperlipidemia    Hypertension    Ischemic cardiomyopathy    a. TTE 2015: EF  50-55%, mild global HK; b. 06/2019 Echo: EF 45-50%, Gr1 DD, basal inf AK. Triv MR.   Kidney stones    Melanoma ([place]) 1980   Resected from his back   Mitral regurgitation    a. noted on TTE 2015   Myocardial infarction [facility])    Past Surgical History:  Procedure Laterality Date   CHOLECYSTECTOMY  2012   COLONOSCOPY     in 2003 with polyp removed and leak anastomosis had to have open abdominal surgery    [place] & 2005   CORONARY ATHERECTOMY N/A 07/08/2019   Procedure: CORONARY ATHERECTOMY;  Surgeon: Jettie Booze, MD;  Location: [place] [facility];  Service: Cardiovascular;  Laterality: N/A;   CORONARY BALLOON ANGIOPLASTY N/A 07/08/2019   Procedure: CORONARY BALLOON ANGIOPLASTY;  Surgeon: Jettie Booze, MD;  Location: [place] [facility];  Service: Cardiovascular;  Laterality: N/A;  diagonal    CORONARY STENT INTERVENTION N/A 07/08/2019   Procedure: CORONARY STENT INTERVENTION;  Surgeon: Jettie Booze, MD;  Location: [place] [facility];  Service: Cardiovascular;  Laterality: N/A;  lad   CYSTOSCOPY/URETEROSCOPY/HOLMIUM LASER/STENT PLACEMENT Right 01/02/2018   Procedure: CYSTOSCOPY/URETEROSCOPY/HOLMIUM LASER/STENT  PLACEMENT;  Surgeon: [name], MD;  Location: [facility];  Service: Urology;  Laterality: Right;   ESOPHAGOGASTRODUODENOSCOPY (EGD) WITH PROPOFOL N/A 11/19/2016   Procedure: ESOPHAGOGASTRODUODENOSCOPY (EGD) WITH PROPOFOL;  Surgeon: [name], MD;  Location: [facility];  Service: Endoscopy;  Laterality: N/A;   ESOPHAGOGASTRODUODENOSCOPY (EGD) WITH PROPOFOL N/A 02/02/2019   Procedure: ESOPHAGOGASTRODUODENOSCOPY (EGD) WITH PROPOFOL;  Surgeon: [name], MD;  Location: [facility];  Service: Endoscopy;  Laterality: N/A;   ICD IMPLANT N/A 02/02/2020    Procedure: ICD IMPLANT;  Surgeon: Vickie Epley, MD;  Location: [place] [facility];  Service: Cardiovascular;  Laterality: N/A;   INTRAVASCULAR PRESSURE WIRE/FFR STUDY N/A 07/07/2019   Procedure: INTRAVASCULAR PRESSURE WIRE/FFR STUDY;  Surgeon: Nelva Bush, MD;  Location: [place] [facility];  Service: Cardiovascular;  Laterality: N/A;   INTRAVASCULAR ULTRASOUND/IVUS N/A 07/08/2019   Procedure: Intravascular Ultrasound/IVUS;  Surgeon: Jettie Booze, MD;  Location: [place] [facility];  Service: Cardiovascular;  Laterality: N/A;   LEAD REVISION/REPAIR N/A 02/16/2020   Procedure: LEAD REVISION/REPAIR;  Surgeon: Constance Haw, MD;  Location: [place] [facility];  Service: Cardiovascular;  Laterality: N/A;   LEFT HEART CATH N/A 07/08/2019   Procedure: Left Heart Cath;  Surgeon: Jettie Booze, MD;  Location: [place] [facility];  Service: Cardiovascular;  Laterality: N/A;   LEFT HEART CATH AND CORONARY ANGIOGRAPHY N/A 07/07/2019   Procedure: LEFT HEART CATH AND CORONARY ANGIOGRAPHY;  Surgeon: Nelva Bush, MD;  Location: [place] [facility];  Service: Cardiovascular;  Laterality: N/A;   LITHOTRIPSY  2015   MELANOMA EXCISION  1980   malignant   NERVE SURGERY  2015   ulna nerve   TONSILLECTOMY  1945   WISDOM TOOTH EXTRACTION     Family History  Problem Relation Age of Onset   Hyperlipidemia Mother    Hypertension Mother    Heart disease Mother    Diabetes Mother    Heart attack Mother    Colon cancer Father        metasized to liver, adrenal, lungs   Lung cancer Father    Kidney cancer Father        malignant capsulated kidney tumor   Diabetes Father    Liver cancer Father    Bladder Cancer Neg Hx    Prostate cancer Neg Hx     Allergies: Metformin and related, Azithromycin, Other, Percocet [oxycodone-acetaminophen], and Jardiance [empagliflozin] Current Outpatient Medications on File Prior to Visit  Medication Sig Dispense Refill   acetaminophen  (TYLENOL) 500 MG tablet Take 1,000 mg by mouth every 6 (six) hours as needed (pain.).     amiodarone (PACERONE) 200 MG tablet Take 200 mg by mouth daily.     aspirin EC 81 MG tablet Take 1 tablet (81 mg total) by mouth daily. Swallow whole. 90 tablet 3   clopidogrel (PLAVIX) 75 MG tablet TAKE 1 TABLET(75 MG) BY MOUTH DAILY WITH BREAKFAST 60 tablet 3   ezetimibe (ZETIA) 10 MG tablet TAKE 1 TABLET(10 MG) BY MOUTH DAILY 90 tablet 1   famotidine (PEPCID) 20 MG tablet Take 20 mg by mouth daily.     fluticasone (FLONASE) 50 MCG/ACT nasal spray Place into both nostrils daily as needed for allergies or rhinitis.     glipiZIDE (GLUCOTROL) 10 MG tablet Take glipizide 5mg  qam breakfast, and 10mg  qpm with supper. 120 tablet 3   midodrine (PROAMATINE) 5 MG tablet Take 1 tablet (5 mg total) by mouth 2 (two) times daily as needed. Take  when BP does drop less than 110 when standing. Additional dose at 2 pm if still dropping below 110 when standing 90 tablet 3   Multiple Vitamin (MULTIVITAMIN) tablet Take 1 tablet by mouth daily.     nitroGLYCERIN (NITROSTAT) 0.4 MG SL tablet Place 1 tablet (0.4 mg total) under the tongue every 5 (five) minutes x 3 doses as needed for chest pain. 25 tablet 0   Omega-3 Fatty Acids (FISH OIL) 1000 MG CAPS Take 1 capsule by mouth daily.     ONETOUCH VERIO test strip USE TO CHECK BLOOD SUGAR TWICE DAILY 100 strip 1   pantoprazole (PROTONIX) 40 MG tablet Take 1 tablet (40 mg total) by mouth daily. 30 tablet 6   Polyvinyl Alcohol-Povidone (MURINE TEARS FOR DRY EYES OP) Place 1-2 drops into both eyes as needed (for dry eyes).      psyllium (METAMUCIL) 58.6 % powder Take 1 packet by mouth at bedtime.     rosuvastatin (CRESTOR) 10 MG tablet Take 1 tablet (10 mg total) by mouth daily. 90 tablet 0   Semaglutide,0.25 or 0.5MG /DOS, (OZEMPIC, 0.25 OR 0.5 MG/DOSE,) 2 MG/1.5ML SOPN Inject 0.5 mg into the skin once a week. 3 mL 2   sodium chloride (OCEAN) 0.65 % SOLN nasal spray Place 1 spray into  both nostrils as needed for congestion.     spironolactone (ALDACTONE) 25 MG tablet Take 25 mg by mouth daily.     tamsulosin (FLOMAX) 0.4 MG CAPS capsule TAKE ONE CAPSULE BY MOUTH DAILY 90 capsule 3   traMADol (ULTRAM) 50 MG tablet TAKE 1 TABLET BY MOUTH EVERY 12 HOURS AS NEEDED 60 tablet 2   No current facility-administered medications on file prior to visit.    Social History   Tobacco Use   Smoking status: Some Days    Types: Pipe   Smokeless tobacco: Former    Quit date: 10/17/2015  Vaping Use   Vaping Use: Never used  Substance Use Topics   Alcohol use: Not Currently    Alcohol/week: 0.0 - 1.0 standard drinks   Drug use: No    Review of Systems  Constitutional:  Negative for chills and fever.  Respiratory:  Negative for cough.   Cardiovascular:  Negative for chest pain and palpitations.  Gastrointestinal:  Negative for nausea and vomiting.  Musculoskeletal:  Positive for back pain.  Neurological:  Negative for dizziness.     Objective:    BP 112/64 (BP Location: Left Arm, Patient Position: Sitting, Cuff Size: Normal)   Pulse 60   Temp 98 F (36.7 C) (Oral)   Ht 6\' 1"  (1.854 m)   Wt 208 lb (94.3 kg)   SpO2 97%   BMI 27.44 kg/m  BP Readings from Last 3 Encounters:  10/30/20 112/64  10/12/20 132/80  08/30/20 118/77   Wt Readings from Last 3 Encounters:  10/30/20 208 lb (94.3 kg)  10/12/20 198 lb (89.8 kg)  08/30/20 203 lb (92.1 kg)    Physical Exam Vitals reviewed.  Constitutional:      Appearance: He is well-developed.  Cardiovascular:     Rate and Rhythm: Regular rhythm.     Heart sounds: Normal heart sounds.  Pulmonary:     Effort: Pulmonary effort is normal. No respiratory distress.     Breath sounds: Normal breath sounds. No wheezing, rhonchi or rales.  Skin:    General: Skin is warm and dry.  Neurological:     Mental Status: He is alert.  Psychiatric:  Speech: Speech normal.        Behavior: Behavior normal.       Assessment &  Plan:   Problem List Items Addressed This Visit       Cardiovascular and Mediastinum   CAD (coronary artery disease)    Chronic stable, continue clopidogrel 75 mg daily, aspirin 81 mg      Relevant Medications   lisinopril (ZESTRIL) 10 MG tablet   carvedilol (COREG) 6.25 MG tablet   Essential hypertension - Primary    Chronic stable. Continue  lisinopril 5 mg, carvedilol 6.25 mg twice daily, spironolactone 25 mg every morning. Declines BMP lab to monitor electrolyte this morning. He would like to have labs done with cardiology.      Relevant Medications   lisinopril (ZESTRIL) 10 MG tablet   carvedilol (COREG) 6.25 MG tablet     Endocrine   Controlled type 2 diabetes mellitus with complication, without long-term current use of insulin (HCC)    Lab Results  Component Value Date   HGBA1C 6.4 (A) 10/30/2020  Excellent control.  Continue glipizide to 5 mg, continue 10 mg with supper, and resume Ozempic 0.5 mg weekly       Relevant Medications   lisinopril (ZESTRIL) 10 MG tablet     Other   Chronic low back pain    Chronic, stable.  Continue ramadol 50 mg twice daily, prn use Tylenol      Other Visit Diagnoses     Type 2 diabetes mellitus with hyperglycemia, without long-term current use of insulin (HCC)       Relevant Medications   lisinopril (ZESTRIL) 10 MG tablet   Other Relevant Orders   POCT HgB A1C (Completed)        I have changed [name] "Ben"'s lisinopril and carvedilol. I am also having him maintain his psyllium, Polyvinyl Alcohol-Povidone (MURINE TEARS FOR DRY EYES OP), sodium chloride, nitroGLYCERIN, acetaminophen, fluticasone, aspirin EC, spironolactone, clopidogrel, midodrine, pantoprazole, famotidine, amiodarone, ezetimibe, multivitamin, OneTouch Verio, traMADol, Fish Oil, glipiZIDE, Ozempic (0.25 or 0.5 MG/DOSE), rosuvastatin, and tamsulosin.   Meds ordered this encounter  Medications   lisinopril (ZESTRIL) 10 MG tablet    Sig: Take 0.5  tablets (5 mg total) by mouth daily.    Dispense:  90 tablet    Refill:  1    Order Specific Question:   Supervising Provider    Answer:   Deborra Medina L [[id number]]   carvedilol (COREG) 6.25 MG tablet    Sig: Take 1 tablet (6.25 mg total) by mouth 2 (two) times daily with a meal.    Dispense:  60 tablet    Refill:  1    Order Specific Question:   Supervising Provider    Answer:   Crecencio Mc [[id number]]     Return precautions given.   Risks, benefits, and alternatives of the medications and treatment plan prescribed today were discussed, and patient expressed understanding.   Education regarding symptom management and diagnosis given to patient on AVS.  Continue to follow with [name], [name] for routine health maintenance.   [name] and I agreed with plan.   [name], [name]

## 2020-10-30 NOTE — Assessment & Plan Note (Signed)
Chronic, stable.  Continue ramadol 50 mg twice daily, prn use Tylenol

## 2020-10-31 ENCOUNTER — Ambulatory Visit (INDEPENDENT_AMBULATORY_CARE_PROVIDER_SITE_OTHER): Payer: Medicare Other

## 2020-10-31 DIAGNOSIS — I255 Ischemic cardiomyopathy: Secondary | ICD-10-CM | POA: Diagnosis not present

## 2020-10-31 LAB — CUP PACEART REMOTE DEVICE CHECK
Battery Remaining Longevity: 180 mo
Battery Remaining Percentage: 100 %
Brady Statistic RV Percent Paced: 1 %
Date Time Interrogation Session: 20221018002100
HighPow Impedance: 78 Ohm
Implantable Lead Implant Date: 20220202
Implantable Lead Location: 753860
Implantable Lead Model: 273
Implantable Lead Serial Number: 111255
Implantable Pulse Generator Implant Date: 20220119
Lead Channel Impedance Value: 425 Ohm
Lead Channel Pacing Threshold Amplitude: 0.8 V
Lead Channel Pacing Threshold Pulse Width: 0.4 ms
Lead Channel Setting Pacing Amplitude: 2 V
Lead Channel Setting Pacing Pulse Width: 0.4 ms
Lead Channel Setting Sensing Sensitivity: 0.5 mV
Pulse Gen Serial Number: 291993

## 2020-11-03 ENCOUNTER — Other Ambulatory Visit: Payer: Self-pay | Admitting: Family

## 2020-11-03 DIAGNOSIS — I10 Essential (primary) hypertension: Secondary | ICD-10-CM

## 2020-11-09 ENCOUNTER — Encounter: Payer: Self-pay | Admitting: Podiatry

## 2020-11-09 ENCOUNTER — Other Ambulatory Visit: Payer: Self-pay

## 2020-11-09 ENCOUNTER — Ambulatory Visit: Payer: Medicare Other | Admitting: Podiatry

## 2020-11-09 DIAGNOSIS — E119 Type 2 diabetes mellitus without complications: Secondary | ICD-10-CM

## 2020-11-09 DIAGNOSIS — M79674 Pain in right toe(s): Secondary | ICD-10-CM | POA: Diagnosis not present

## 2020-11-09 DIAGNOSIS — E118 Type 2 diabetes mellitus with unspecified complications: Secondary | ICD-10-CM | POA: Diagnosis not present

## 2020-11-09 DIAGNOSIS — M79675 Pain in left toe(s): Secondary | ICD-10-CM

## 2020-11-09 DIAGNOSIS — B351 Tinea unguium: Secondary | ICD-10-CM | POA: Diagnosis not present

## 2020-11-09 NOTE — Progress Notes (Signed)
Remote ICD transmission.   

## 2020-11-09 NOTE — Progress Notes (Signed)
This patient returns to my office for at risk foot care.  This patient requires this care by a professional since this patient will be at risk due to having diabetes,He had seen Dr.  Sherryle Lis for previous nail care.  This patient is unable to cut nails himself since the patient cannot reach his nails.These nails are painful walking and wearing shoes.  This patient presents for at risk foot care today.  General Appearance  Alert, conversant and in no acute stress.  Vascular  Dorsalis pedis and posterior tibial  pulses are palpable  bilaterally.  Capillary return is within normal limits  bilaterally. Temperature is within normal limits  bilaterally.  Neurologic  Senn-Weinstein monofilament wire test within normal limits  bilaterally. Muscle power within normal limits bilaterally.  Nails Thick disfigured discolored nails with subungual debris  from hallux to fifth toes bilaterally. No evidence of bacterial infection or drainage bilaterally.  Orthopedic  No limitations of motion  feet .  No crepitus or effusions noted.  No bony pathology or digital deformities noted. HAV  B/L  Skin  normotropic skin with no porokeratosis noted bilaterally.  No signs of infections or ulcers noted.     Onychomycosis  Pain in right toes  Pain in left toes  Consent was obtained for treatment procedures.   Mechanical debridement of nails 1-5  bilaterally performed with a nail nipper.  Filed with dremel without incident.    Return office visit    3 months                  Told patient to return for periodic foot care and evaluation due to potential at risk complications.   Gardiner Barefoot DPM

## 2020-11-10 ENCOUNTER — Ambulatory Visit: Payer: Medicare Other | Admitting: Family

## 2020-12-08 ENCOUNTER — Other Ambulatory Visit: Payer: Self-pay | Admitting: Cardiovascular Disease

## 2020-12-08 ENCOUNTER — Other Ambulatory Visit: Payer: Self-pay | Admitting: Family

## 2020-12-08 DIAGNOSIS — G8929 Other chronic pain: Secondary | ICD-10-CM

## 2020-12-08 NOTE — Telephone Encounter (Signed)
LOV: 10/30/20 NOV: 02/07/21

## 2020-12-10 ENCOUNTER — Telehealth: Payer: Self-pay | Admitting: Internal Medicine

## 2020-12-10 DIAGNOSIS — R001 Bradycardia, unspecified: Secondary | ICD-10-CM | POA: Diagnosis not present

## 2020-12-10 NOTE — Telephone Encounter (Signed)
Was called by Mr. Ozanich this evening due to persistent heart rates in the 40s and lower blood pressures.  Notes that he has been feeling a little fatigued and short of breath throughout the day and tried to go on a walk today which was limited by shortness of breath.  He is taken his vital signs multiple times with systolics in the 141C over 50s, and heart rates are persistently at 40.  He has a known reduced ejection fraction of 35 to 40%.  My worry is that he is no longer conducting through his AV node and is now backup pacing on his ICD that is in place, which would put him at risk for worsening symptoms.  I reviewed his most recent device check which noted that he had a backup pacer rate set at VVI 40.  He notes to me that he took midodrine earlier this afternoon to attempt to help with his blood pressure.  However there was no changes in his symptoms.  I recommended that he go to the emergency department for evaluation given this abrupt change.  He stated awareness and noted that he would likely go into [place] [facility] emergency department to be seen.  Billey Chang MD Cardiology Fellow

## 2020-12-11 ENCOUNTER — Telehealth: Payer: Self-pay | Admitting: Cardiovascular Disease

## 2020-12-11 NOTE — Telephone Encounter (Signed)
I spoke with the patient and advised him of the message I received back from our [facility] team regarding his ICD settings.   The patient confirmed that he checked his medications this morning after we initially spoke:  AM meds include:  - Lisinopril 10 mg - Clopidogrel 75 mg - Pantoprazole 40 mg - Carvedilol 6.25 mg - Glipizide  - Tramadol 50 mg  Noon meds include: - ASA 81 mg - Tamsulosin 0.4 mg - MVT - Fish Oil  - Spironolactone 25 mg (his RX ran out yesterday)   PM meds include: - Amiodarone 200 mg - Carvediolol 6.25 mg - Rosuvastatin 10 mg - Ezetimibe 10 mg - Tramadol 50 mg   He has not taken his coreg since yesterday morning.  The patient confirmed he did not notice any symptoms until yesterday when walking up a slight hill around his block and he had to rest twice before coming home.  After his walk BP (HR)- 11/27: 4:30 pm- 113/56 (40) 5:15 pm- 107/58 (40) 5:30 pm- 105/54 (40) 7:00 pm- 120/67 (40) 9:30 pm- 123/76 (66)  Today BP (HR) readings are: 6:30 am- 129/81 (60) 11:00 am- 124/71 (70) 1:30 pm- 115/70 (69)  I advised the patient I am uncertain as to why his HR's dropped yesterday after being on the same doses of medications for a while. His only medication change in the last week has been a Tylenol PM for sleep.   I have advised the patient I will review with Drs. [name]/ Lambert for further recommendations but in the interim, if his HR are > 60 bpm at the times he is due to take his coreg, to just take 3.125 mg to see how his HR tolerates this dose.   He is aware we will call back with further MD recommendations once received.  He voices understanding and was very appreciative of the call back.

## 2020-12-11 NOTE — Telephone Encounter (Signed)
ICD Transmission received on 12/10/20 at 23:55, his lower rate on his ICD is set to 40 so its possible that he has recorded heart rates in 40's

## 2020-12-11 NOTE — Telephone Encounter (Signed)
Pt c/o BP issue: STAT if pt c/o blurred vision, one-sided weakness or slurred speech  1. What are your last 5 BP readings?   91/58  40 129/85  62 99/64  64 113/56  40 91/48  40 took midodrine came up to 120/67   40 129/81  60 today    2. Are you having any other symptoms (ex. Dizziness, headache, blurred vision, passed out)?  SOBE   3. What is your BP issue? Patient concerned medications may be cause of low readings

## 2020-12-11 NOTE — Telephone Encounter (Signed)
Call transferred directly to this RN from scheduling.  The patient called to advise that over the last week, he has noticed that upon waking, his BP (HR) is normal, but there have been intermittent episodes between 11:30 am- 1:00 pm that his BP has been 92-100/ 61 (67).   He advised that when his SBP has been in the 90's he will take a midodrine 5 mg tablet and within 1-1.5 hours his SBP will be 110-115.  He states that yesterday morning when waking his BP (HR) were 129/85 (62). When he got home from church ~ 12 pm he checked his readings again and they were 99/64 (64).   He took a walk around the block mid afternoon, which included a slight hill, and advised he had to stop 3 times due to SOB, which is not common for him.   When he arrived home from the walk, he checked his BP (HR) again: 4:30 pm- 113/56 (40) 5:15 pm- 107/58 (40) 5:30 pm- 105/54 (40) 7:00 pm- 120/67 (40)  He was concerned about the HR readings in the 40s and that his own equipment may be faulty, so he called EMS. Per the patient, EMS performed an EKG and noted his HR was also in the 40's, but otherwise "normal." He was advised to seek medical attention in the ER but declined. He states other than the SOB with his afternoon walk, he was not having any symptoms.   I inquired if the patient was still taking: - amiodarone 200 mg QD - coreg 6.25 mg BID - lisinopril 10 mg QD - spirononlactone 25 mg QD  The patient confirmed he was, but he was not sure of what times of day he takes these meds aside from the coreg, which he held last night. He states ~ 9:00 pm, his HR's were "normal."  The patient confirms he does have a device and that he did send a manual transmission last night.  I have advised the patient I will reach out to the device team to see if they can advise on what was seen on his transmission, and in the mean time, he will clarify what times he takes the above mentioned medications.  He is aware I will call  him back once I hear from the [facility]. He voices understanding and is agreeable. He would like a call back on his cell # due to blocking he has on his land line. I have verified with the patient that the cell # on file is correct.

## 2020-12-13 MED ORDER — CARVEDILOL 3.125 MG PO TABS: 3.1250 mg | ORAL_TABLET | Freq: Two times a day (BID) | ORAL | 3 refills | Status: DC

## 2020-12-13 NOTE — Telephone Encounter (Signed)
Per Dr. Quentin Ore:  Decrease carvedilol to 3.125 mg by mouth twice a day.  Follow up in 6 months.  Will send updated instructions via MyChart.  Good afternoon!  Dr. Quentin Ore would like for you to reduce your carvedilol to 3.125 mg by mouth twice a day.  I have sent the new prescription to your pharmacy, I think your pills are probably too small to break in half.  He said he would like to see you in 6-8 weeks.  I grabbed January 17, 2021 at 8:20 am at the [facility] office.  Let me know if that will work for you.  Sonia Baller RN

## 2020-12-14 NOTE — Telephone Encounter (Signed)
Message received back via MyChart from Pt.  Pulse has increased to 70's on lower dose of carvedilol.  Await further needs.

## 2020-12-18 ENCOUNTER — Ambulatory Visit: Payer: Medicare Other | Admitting: Dermatology

## 2020-12-18 ENCOUNTER — Other Ambulatory Visit: Payer: Self-pay

## 2020-12-18 DIAGNOSIS — L409 Psoriasis, unspecified: Secondary | ICD-10-CM

## 2020-12-18 DIAGNOSIS — Z1283 Encounter for screening for malignant neoplasm of skin: Secondary | ICD-10-CM

## 2020-12-18 DIAGNOSIS — L570 Actinic keratosis: Secondary | ICD-10-CM | POA: Diagnosis not present

## 2020-12-18 DIAGNOSIS — Z85820 Personal history of malignant melanoma of skin: Secondary | ICD-10-CM

## 2020-12-18 DIAGNOSIS — D1800 Hemangioma unspecified site: Secondary | ICD-10-CM | POA: Diagnosis not present

## 2020-12-18 DIAGNOSIS — L814 Other melanin hyperpigmentation: Secondary | ICD-10-CM | POA: Diagnosis not present

## 2020-12-18 DIAGNOSIS — L821 Other seborrheic keratosis: Secondary | ICD-10-CM | POA: Diagnosis not present

## 2020-12-18 DIAGNOSIS — L820 Inflamed seborrheic keratosis: Secondary | ICD-10-CM

## 2020-12-18 DIAGNOSIS — D229 Melanocytic nevi, unspecified: Secondary | ICD-10-CM

## 2020-12-18 DIAGNOSIS — L578 Other skin changes due to chronic exposure to nonionizing radiation: Secondary | ICD-10-CM | POA: Diagnosis not present

## 2020-12-18 DIAGNOSIS — Z872 Personal history of diseases of the skin and subcutaneous tissue: Secondary | ICD-10-CM

## 2020-12-18 MED ORDER — MOMETASONE FUROATE 0.1 % EX CREA
TOPICAL_CREAM | CUTANEOUS | 2 refills | Status: DC
Start: 2020-12-18 — End: 2021-08-27

## 2020-12-18 NOTE — Patient Instructions (Addendum)
Cryotherapy Aftercare  Wash gently with soap and water everyday.   Apply Vaseline and Band-Aid daily until healed.    Start mometasone cream to affected areas at elbows twice daily up to 5 days a week as needed. Avoid applying to face, groin, and axilla. Use as directed. Long-term use can cause thinning of the skin.  Topical steroids (such as triamcinolone, fluocinolone, fluocinonide, mometasone, clobetasol, halobetasol, betamethasone, hydrocortisone) can cause thinning and lightening of the skin if they are used for too long in the same area. Your physician has selected the right strength medicine for your problem and area affected on the body. Please use your medication only as directed by your physician to prevent side effects.    Melanoma ABCDEs  Melanoma is the most dangerous type of skin cancer, and is the leading cause of death from skin disease.  You are more likely to develop melanoma if you: Have light-colored skin, light-colored eyes, or red or blond hair Spend a lot of time in the sun Tan regularly, either outdoors or in a tanning bed Have had blistering sunburns, especially during childhood Have a close family member who has had a melanoma Have atypical moles or large birthmarks  Early detection of melanoma is key since treatment is typically straightforward and cure rates are extremely high if we catch it early.   The first sign of melanoma is often a change in a mole or a new dark spot.  The ABCDE system is a way of remembering the signs of melanoma.  A for asymmetry:  The two halves do not match. B for border:  The edges of the growth are irregular. C for color:  A mixture of colors are present instead of an even brown color. D for diameter:  Melanomas are usually (but not always) greater than 24mm - the size of a pencil eraser. E for evolution:  The spot keeps changing in size, shape, and color.  Please check your skin once per month between visits. You can use a small  mirror in front and a large mirror behind you to keep an eye on the back side or your body.   If you see any new or changing lesions before your next follow-up, please call to schedule a visit.  Please continue daily skin protection including broad spectrum sunscreen SPF 30+ to sun-exposed areas, reapplying every 2 hours as needed when you're outdoors.    If You Need Anything After Your Visit  If you have any questions or concerns for your doctor, please call our main line at [phone] and press option 4 to reach your doctor's medical assistant. If no one answers, please leave a voicemail as directed and we will return your call as soon as possible. Messages left after 4 pm will be answered the following business day.   You may also send [place] a message via [place]. We typically respond to MyChart messages within 1-2 business days.  For prescription refills, please ask your pharmacy to contact our office. Our fax number is [phone].  If you have an urgent issue when the clinic is closed that cannot wait until the next business day, you can page your doctor at the number below.    Please note that while we do our best to be available for urgent issues outside of office hours, we are not available 24/7.   If you have an urgent issue and are unable to reach [place], you may choose to seek medical care at your doctor's office,  retail clinic, urgent care center, or emergency room.  If you have a medical emergency, please immediately call 911 or go to the emergency department.  Pager Numbers  - Dr. Nehemiah Massed: [phone]  - Dr. Laurence Ferrari: [phone]  - Dr. Nicole Kindred: [phone]  In the event of inclement weather, please call our main line at [phone] for an update on the status of any delays or closures.  Dermatology Medication Tips: Please keep the boxes that topical medications come in in order to help keep track of the instructions about where and how to use these. Pharmacies typically  print the medication instructions only on the boxes and not directly on the medication tubes.   If your medication is too expensive, please contact our office at [phone] option 4 or send [place] a message through [place].   We are unable to tell what your co-pay for medications will be in advance as this is different depending on your insurance coverage. However, we may be able to find a substitute medication at lower cost or fill out paperwork to get insurance to cover a needed medication.   If a prior authorization is required to get your medication covered by your insurance company, please allow [place] 1-2 business days to complete this process.  Drug prices often vary depending on where the prescription is filled and some pharmacies may offer cheaper prices.  The website www.goodrx.com contains coupons for medications through different pharmacies. The prices here do not account for what the cost may be with help from insurance (it may be cheaper with your insurance), but the website can give you the price if you did not use any insurance.  - You can print the associated coupon and take it with your prescription to the pharmacy.  - You may also stop by our office during regular business hours and pick up a GoodRx coupon card.  - If you need your prescription sent electronically to a different pharmacy, notify our office through [facility] or by phone at [phone] option 4.     Si Usted Necesita Algo Despus de Su Visita  Tambin puede enviarnos un mensaje a travs de Pharmacist, community. Por lo general respondemos a los mensajes de MyChart en el transcurso de 1 a 2 das hbiles.  Para renovar recetas, por favor pida a su farmacia que se ponga en contacto con nuestra oficina. Harland Dingwall de fax es [place] [phone].  Si tiene un asunto urgente cuando la clnica est cerrada y que no puede esperar hasta el siguiente da hbil, puede llamar/localizar a su doctor(a) al nmero que aparece a  continuacin.   Por favor, tenga en cuenta que aunque hacemos todo lo posible para estar disponibles para asuntos urgentes fuera del horario de [place], no estamos disponibles las 24 horas del da, los 7 das de la [place].   Si tiene un problema urgente y no puede comunicarse con nosotros, puede optar por buscar atencin mdica  en el consultorio de su doctor(a), en una clnica privada, en un centro de atencin urgente o en una sala de emergencias.  Si tiene Engineering geologist, por favor llame inmediatamente al 911 o vaya a la sala de emergencias.  Nmeros de bper  - Dr. Nehemiah Massed: [phone]  - Dra. Moye: [phone]  - Dra. Nicole Kindred: [phone]  En caso de inclemencias del [place], por favor llame a Johnsie Kindred principal al [phone] para una actualizacin sobre el [place] de cualquier retraso o cierre.  Consejos para la medicacin en dermatologa: Por favor, guarde  las cajas en las que vienen los medicamentos de uso tpico para ayudarle a seguir las instrucciones sobre dnde y cmo usarlos. Las farmacias generalmente imprimen las instrucciones del medicamento slo en las cajas y no directamente en los tubos del [place].   Si su medicamento es muy caro, por favor, pngase en contacto con Zigmund Daniel llamando al [phone] y presione la opcin 4 o envenos un mensaje a travs de Pharmacist, community.   No podemos decirle cul ser su copago por los medicamentos por adelantado ya que esto es diferente dependiendo de la cobertura de su seguro. Sin embargo, es posible que podamos encontrar un medicamento sustituto a Electrical engineer un formulario para que el seguro cubra el medicamento que se considera necesario.   Si se requiere una autorizacin previa para que su compaa de seguros [place] su medicamento, por favor permtanos de 1 a 2 das hbiles para completar este proceso.  Los precios de los medicamentos varan con frecuencia dependiendo del Environmental consultant de dnde se surte la receta  y alguna farmacias pueden ofrecer precios ms baratos.  El sitio web www.goodrx.com tiene cupones para medicamentos de Airline pilot. Los precios aqu no tienen en cuenta lo que podra costar con la ayuda del seguro (puede ser ms barato con su seguro), pero el sitio web puede darle el precio si no utiliz Research scientist (physical sciences).  - Puede imprimir el cupn correspondiente y llevarlo con su receta a la farmacia.  - Tambin puede pasar por nuestra oficina durante el horario de atencin regular y Charity fundraiser una tarjeta de cupones de GoodRx.  - Si necesita que su receta se enve electrnicamente a una farmacia diferente, informe a nuestra oficina a travs de MyChart de [place] o por telfono llamando al [phone] y presione la opcin 4.

## 2020-12-18 NOTE — Progress Notes (Signed)
Follow-Up Visit   Subjective  [name] [name] is a 80 y.o. male who presents for the following: Annual Exam (Patient here for full body skin exam and skin cancer screening. Patient with a hx of melanoma and AK's. Patient advises he is not aware of any new or changing spots today.) and Psoriasis (Patient does have some psoriasis at elbows and is using HC 1%. The patient has spots, moles and lesions to be evaluated, some may be new or changing and the patient has concerns that these could be cancer.  The following portions of the chart were reviewed this encounter and updated as appropriate:   Tobacco  Allergies  Meds  Problems  Med Hx  Surg Hx  Fam Hx     Review of Systems:  No other skin or systemic complaints except as noted in HPI or Assessment and Plan.  Objective  Well appearing patient in no apparent distress; mood and affect are within normal limits.  A full examination was performed including scalp, head, eyes, ears, nose, lips, neck, chest, axillae, abdomen, back, buttocks, bilateral upper extremities, bilateral lower extremities, hands, feet, fingers, toes, fingernails, and toenails. All findings within normal limits unless otherwise noted below.  Scalp, ear (18) Erythematous thin papules/macules with gritty scale.   Left Nose Erythematous keratotic or waxy stuck-on papule or plaque.   bilateral elbows Scaly pink patches at elbows   Assessment & Plan  AK (actinic keratosis) (18) Scalp, ear  Destruction of lesion - Scalp, ear Complexity: simple   Destruction method: cryotherapy   Informed consent: discussed and consent obtained   Timeout:  patient name, date of birth, surgical site, and procedure verified Lesion destroyed using liquid nitrogen: Yes   Region frozen until ice ball extended beyond lesion: Yes   Outcome: patient tolerated procedure well with no complications   Post-procedure details: wound care instructions given    Inflamed seborrheic  keratosis Left Nose Destruction of lesion - Left Nose Complexity: simple   Destruction method: cryotherapy   Informed consent: discussed and consent obtained   Timeout:  patient name, date of birth, surgical site, and procedure verified Lesion destroyed using liquid nitrogen: Yes   Region frozen until ice ball extended beyond lesion: Yes   Outcome: patient tolerated procedure well with no complications   Post-procedure details: wound care instructions given    Psoriasis bilateral elbows Psoriasis is a chronic non-curable, but treatable genetic/hereditary disease that may have other systemic features affecting other organ systems such as joints (Psoriatic Arthritis). It is associated with an increased risk of inflammatory bowel disease, heart disease, non-alcoholic fatty liver disease, and depression.    Start mometasone cream to affected areas at elbows twice daily up to 5 days a week as needed. Avoid applying to face, groin, and axilla. Use as directed. Long-term use can cause thinning of the skin.  Topical steroids (such as triamcinolone, fluocinolone, fluocinonide, mometasone, clobetasol, halobetasol, betamethasone, hydrocortisone) can cause thinning and lightening of the skin if they are used for too long in the same area. Your physician has selected the right strength medicine for your problem and area affected on the body. Please use your medication only as directed by your physician to prevent side effects.   mometasone (ELOCON) 0.1 % cream - bilateral elbows Apply to affected areas at elbow twice daily up to 5 days a week as needed. Avoid applying to face, groin, and axilla. Use as directed. Long-term use can cause thinning of the skin.  Skin cancer screening  History of Melanoma - No evidence of recurrence today at right upper back - No lymphadenopathy - Recommend regular full body skin exams - Recommend daily broad spectrum sunscreen SPF 30+ to sun-exposed areas, reapply every  2 hours as needed.  - Call if any new or changing lesions are noted between office visits  Lentigines - Scattered tan macules - Due to sun exposure - Benign-appearing, observe - Recommend daily broad spectrum sunscreen SPF 30+ to sun-exposed areas, reapply every 2 hours as needed. - Call for any changes  Seborrheic Keratoses - Stuck-on, waxy, tan-brown papules and/or plaques  - Benign-appearing - Discussed benign etiology and prognosis. - Observe - Call for any changes  Melanocytic Nevi - Tan-brown and/or pink-flesh-colored symmetric macules and papules - Benign appearing on exam today - Observation - Call clinic for new or changing moles - Recommend daily use of broad spectrum spf 30+ sunscreen to sun-exposed areas.   Hemangiomas - Red papules - Discussed benign nature - Observe - Call for any changes  Actinic Damage - Chronic condition, secondary to cumulative UV/sun exposure - diffuse scaly erythematous macules with underlying dyspigmentation - Recommend daily broad spectrum sunscreen SPF 30+ to sun-exposed areas, reapply every 2 hours as needed.  - Staying in the shade or wearing long sleeves, sun glasses (UVA+UVB protection) and wide brim hats (4-inch brim around the entire circumference of the hat) are also recommended for sun protection.  - Call for new or changing lesions.  Skin cancer screening performed today.  Return in about 6 months (around 06/18/2021) for AK follow up, ISK follow up.  Graciella Belton, RMA, am acting as scribe for Sarina Ser, MD . Documentation: I have reviewed the above documentation for accuracy and completeness, and I agree with the above.  Sarina Ser, MD

## 2020-12-25 ENCOUNTER — Ambulatory Visit (INDEPENDENT_AMBULATORY_CARE_PROVIDER_SITE_OTHER): Payer: Medicare Other | Admitting: Pharmacist

## 2020-12-25 DIAGNOSIS — I10 Essential (primary) hypertension: Secondary | ICD-10-CM

## 2020-12-25 DIAGNOSIS — E1165 Type 2 diabetes mellitus with hyperglycemia: Secondary | ICD-10-CM

## 2020-12-25 DIAGNOSIS — I25118 Atherosclerotic heart disease of native coronary artery with other forms of angina pectoris: Secondary | ICD-10-CM

## 2020-12-25 DIAGNOSIS — G8929 Other chronic pain: Secondary | ICD-10-CM

## 2020-12-25 DIAGNOSIS — M545 Low back pain, unspecified: Secondary | ICD-10-CM

## 2020-12-25 DIAGNOSIS — N400 Enlarged prostate without lower urinary tract symptoms: Secondary | ICD-10-CM

## 2020-12-25 NOTE — Chronic Care Management (AMB) (Signed)
Chronic Care Management CCM Pharmacy Note  12/25/2020 Name:  [name] [name] MRN:  [id number] DOB:  1940/11/23  Summary: - Due to reapply for patient assistance  Recommendations/Changes made from today's visit: - Reapplying for [name] assistance - Discussed vaccine recommendations  Subjective: [name] [name] is an 80 y.o. year old male who is a primary patient of [name], [name].  The CCM team was consulted for assistance with disease management and care coordination needs.    Engaged with patient by telephone for follow up visit for pharmacy case management and/or care coordination services.   Objective:  Medications Reviewed Today     Reviewed by De Hollingshead, RPH-CPP (Pharmacist) on 12/25/20 at 1008  Med List Status: <None>   Medication Order Taking? Sig Documenting Provider Last Dose Status Informant  acetaminophen (TYLENOL) 500 MG tablet [id number] Yes Take 1,000 mg by mouth every 6 (six) hours as needed (pain.). [provider] Taking Active   amiodarone (PACERONE) 200 MG tablet [id number] Yes Take 200 mg by mouth daily. [provider] Taking Active   aspirin EC 81 MG tablet [id number] Yes Take 1 tablet (81 mg total) by mouth daily. Swallow whole. [name], MD Taking Active   carvedilol (COREG) 3.125 MG tablet [id number] Yes Take 1 tablet (3.125 mg total) by mouth 2 (two) times daily. Vickie Epley, MD Taking Active   clopidogrel (PLAVIX) 75 MG tablet [id number] Yes TAKE 1 TABLET(75 MG) BY MOUTH DAILY WITH BREAKFAST [name], [name], MD Taking Active   ezetimibe (ZETIA) 10 MG tablet [id number] Yes TAKE 1 TABLET(10 MG) BY MOUTH DAILY [name], [name], MD Taking Active   famotidine (PEPCID) 20 MG tablet [id number] Yes Take 20 mg by mouth daily. [provider] Taking Active   fluticasone (FLONASE) 50 MCG/ACT nasal spray [id number] Yes Place into both nostrils daily as needed for allergies or rhinitis. [provider] Taking Active   glipiZIDE (GLUCOTROL) 10 MG tablet [id number] Yes Take glipizide 5mg  qam breakfast, and 10mg  qpm with supper. [name], [name] Taking Active   lisinopril (ZESTRIL) 10 MG tablet [id number] Yes TAKE 1 TABLET BY MOUTH DAILY [name], [name], [name] Taking Active   midodrine (PROAMATINE) 5 MG tablet [id number] Yes Take 1 tablet (5 mg total) by mouth 2 (two) times daily as needed. Take when BP does drop less than 110 when standing. Additional dose at 2 pm if still dropping below 110 when standing [name], [name], MD Taking Active   mometasone (ELOCON) 0.1 % cream [id number]  Apply to affected areas at elbow twice daily up to 5 days a week as needed. Avoid applying to face, groin, and axilla. Use as directed. Long-term use can cause thinning of the skin. [name], MD  Active   Multiple Vitamin (MULTIVITAMIN) tablet [id number] Yes Take 1 tablet by mouth daily. [provider] Taking Active   nitroGLYCERIN (NITROSTAT) 0.4 MG SL tablet [id number]  Place 1 tablet (0.4 mg total) under the tongue every 5 (five) minutes x 3 doses as needed for chest pain. [name], NP  Active   Omega-3 Fatty Acids (FISH OIL) 1000 MG CAPS [id number] Yes Take 1 capsule by mouth daily. [provider] Taking Active   [facility] VERIO test strip [id number] Yes USE TO CHECK BLOOD SUGAR TWICE DAILY [name], [name] Taking Active   pantoprazole (PROTONIX) 40 MG tablet [id number] Yes Take 1 tablet (40 mg total) by mouth daily. [name], MD Taking Active  Polyvinyl Alcohol-Povidone (MURINE TEARS FOR DRY EYES OP) [id number] Yes Place 1-2 drops into both eyes as needed (for dry eyes).  [provider] Taking Active Self  psyllium (METAMUCIL) 58.6 % powder [id number] Yes Take 1 packet by mouth at bedtime. [provider] Taking Active Self           Med Note Duffy [name], [name]   Wed Jul 07, 2019  6:13 PM)    rosuvastatin (CRESTOR) 10 MG tablet [id number] Yes Take 1  tablet (10 mg total) by mouth daily. Vickie Epley, MD Taking Active   Semaglutide, 1 MG/DOSE, (OZEMPIC, 1 MG/DOSE,) 4 MG/3ML SOPN [id number] Yes Inject 1 mg into the skin once a week. [provider] Taking Active   sodium chloride (OCEAN) 0.65 % SOLN nasal spray [id number]  Place 1 spray into both nostrils as needed for congestion. [provider]  Active Self  spironolactone (ALDACTONE) 25 MG tablet [id number] Yes TAKE 1 TABLET(25 MG) BY MOUTH DAILY Rockey Situ, [name], MD Taking Active   tamsulosin (FLOMAX) 0.4 MG CAPS capsule [id number] Yes TAKE ONE CAPSULE BY MOUTH DAILY [name], MD Taking Active   traMADol (ULTRAM) 50 MG tablet [id number] Yes TAKE 1 TABLET BY MOUTH EVERY 12 HOURS AS NEEDED [name], [name] Taking Active             Pertinent Labs:   Lab Results  Component Value Date   HGBA1C 6.4 (A) 10/30/2020   Lab Results  Component Value Date   CHOL 124 12/21/2019   HDL 36 (L) 12/21/2019   LDLCALC 35 12/21/2019   LDLDIRECT 55.0 02/10/2019   TRIG 267 (H) 12/21/2019   CHOLHDL 3.4 12/21/2019   Lab Results  Component Value Date   CREATININE 0.97 08/02/2020   BUN 22 08/02/2020   NA 141 08/02/2020   K 5.1 08/02/2020   CL 103 08/02/2020   CO2 22 08/02/2020    SDOH:  (Social Determinants of Health) assessments and interventions performed:  SDOH Interventions    Flowsheet Row Most Recent Value  SDOH Interventions   Financial Strain Interventions Other (Comment)  [manufacturer assistance]       CCM Care Plan  Review of patient past medical history, allergies, medications, health status, including review of consultants reports, laboratory and other test data, was performed as part of comprehensive evaluation and provision of chronic care management services.   Care Plan : Medication Management  Updates made by De Hollingshead, RPH-CPP since 12/25/2020 12:00 AM     Problem: Diabetes, CAD, HTN, HLD      Long-Range Goal:  Disease Progression Prevention   This Visit's Progress: On track  Recent Progress: On track  Priority: High  Note:   Current Barriers:  Unable to independently afford treatment regimen Unable to achieve control of diabetes   Pharmacist Clinical Goal(s):  Over the next 90 days, patient will verbalize ability to afford treatment regimen Over the next 90 days, patient will maintain control of diabetes as evidenced by A1c  through collaboration with PharmD and provider.   Interventions: 1:1 collaboration with [name], [name] regarding development and update of comprehensive plan of care as evidenced by provider attestation and co-signature Inter-disciplinary care team collaboration (see longitudinal plan of care) Comprehensive medication review performed; medication list updated in electronic medical record  Health Maintenance Yearly diabetic eye exam: up to date Yearly diabetic foot exam: up to date Urine microalbumin: up to date Yearly influenza vaccination: up to date  Td/Tdap vaccination:  up to date Pneumonia vaccination: due - he notes he has received a previous dose, but unsure what. He will check his records COVID vaccinations: up to date - added to chart  Shingrix vaccinations: due - recommended he pursue at local pharmacy in 2023 Colonoscopy: up to date  Diabetes: Controlled; current treatment: Ozempic 1 mg weekly; glipizide 5 mg QAM, 10 mg QPM Approved for [employer] assistance through 2023.  Denies GI upset with this dose of Ozempic Has been unable to tolerate metformin, IR and ER, d/t diarrhea.  Did not tolerate Jardiance d/t tongue itching/sores, avoid other SGLT2 for risk of allergic reaction Current glucose readings: reports in the past week, lowest 105, highest 130. Denies episodes of hypoglycemia.  Will collaborate w/ CPhT, patient, and providers to reapply for patient assistance for [place] for 2023. Recommended to continue current regimen at this  time  Hypertension, Ventricular Ectopy, s/p ICD placement Controlled per last office visit; current treatment: carvedilol 3.125 mg BID, spironolactone 25 mg QAM, lisinopril 10 mg QAM; follows w/ Dr. Rockey Situ;  Antiarrhythmic: amiodarone 200 mg daily; follows w/ Dr. Quentin Ore Midodrine 5 mg by Dr. Rockey Situ to take PRN SBP <110;  Home BP readings: 118/73, HR 69. Reports generally in ~120/60s with HR close to 70 Bradycardic episode. Upcoming appointments with cardiology. Recommended to continue current regimen along with collaboration with cardiology and EP as above  Hyperlipidemia and ASCVD risk reduction: Controlled on last lab work, but not checked on current regimen; current treatment: rosuvastatin 10 mg daily, ezetimibe 10 mg daily   Previously tried high intensity rosuvastatin, but patient did not tolerate due to muscle pains. Antiplatelet regimen: clopidogrel 75 mg daily, aspirin 81 mg daily  Previously recommended to continue current regimen at this time along with cardiology collaboration. Recheck lipid panel with next set of labwork.  BPH: Controlled per patient report; current regimen: tamsulosin 0.4 mg daily; follows w/ Dr. Diamantina Providence Previously recommended to continue current regimen at this time  GERD: Controlled per patient report; current regimen: pantoprazole 40 mg daily, famotidine 40 mg daily  Previously recommended to continue current regimen at this time  Chronic Pain; Controlled per patient report; current regimen: tramadol 50 mg BID; acetaminophen 500 mg PRN for back pain, taking ~ 1-2 times weekly. Reports that he takes Tylenol PM every evening Educated on risk of diphenhydramine in the elderly, anticholinergic side effects. Encouraged plain Acetaminophen at bedtime.  Recommended to continue current regimen at this time along with collaboration with PCP  Patient Goals/Self-Care Activities Over the next 90 days, patient will:  - take medications as prescribed check glucose  daily, document, and provide at future appointments check blood pressure daily, document, and provide at future appointments collaborate with provider on medication access solutions      Plan: Telephone follow up appointment with care management team member scheduled for:  3 months  [name], PharmD, University, [place] Pharmacist [employer] at [employer] [phone]

## 2020-12-25 NOTE — Patient Instructions (Signed)
Mr. Aldridge,   Keep up the great work!  We'll mail you the pages of the [facility] assistance application to complete for 2023. We will need updated proof of income - we used the 2020 Tax Return before, we will need to use your 2023 Tax Return or 2021/2022 social security statements or pensions.  Check your history and see if you know if you received the "Prevnar 13" or the "Pneumovax 23" pneumonia vaccines.   Talk to your local pharmacy about the Shingrix vaccine series.   Take care!  [name], PharmD   Visit Information  Following are the goals we discussed today:  Patient Goals/Self-Care Activities Over the next 90 days, patient will:  - take medications as prescribed check glucose daily, document, and provide at future appointments check blood pressure daily, document, and provide at future appointments collaborate with provider on medication access solutions        Plan: Telephone follow up appointment with care management team member scheduled for:  3 months   [name], PharmD, [place], CPP Clinical Pharmacist [place] at [facility] [phone]   Please call the care guide team at [phone] if you need to cancel or reschedule your appointment.   Patient verbalizes understanding of instructions provided today and agrees to view in [place].

## 2020-12-26 ENCOUNTER — Telehealth: Payer: Self-pay | Admitting: Pharmacy Technician

## 2020-12-26 ENCOUNTER — Encounter: Payer: Self-pay | Admitting: Dermatology

## 2020-12-26 DIAGNOSIS — Z596 Low income: Secondary | ICD-10-CM

## 2020-12-26 NOTE — Progress Notes (Signed)
[facility])                                            [place] Team    12/26/2020  [name] [name] 11/04/1940 [id number]                                      Medication Assistance Referral  Referral From: [facility] Embedded RPh [name] T.   Medication/Company: [employer] / [employer] Patient application portion:  Education officer, museum portion: Magazine features editor to [employer], [place] [employer] address/fax verified via: [employer]. [name] [name], [place]  [phone]

## 2021-01-11 ENCOUNTER — Other Ambulatory Visit: Payer: Self-pay | Admitting: Cardiovascular Disease

## 2021-01-11 NOTE — Telephone Encounter (Signed)
Please schedule 6 month f/U with Dr. Rockey Situ for further refills. Thank you!

## 2021-01-13 ENCOUNTER — Other Ambulatory Visit: Payer: Self-pay | Admitting: Cardiology

## 2021-01-13 DIAGNOSIS — N400 Enlarged prostate without lower urinary tract symptoms: Secondary | ICD-10-CM

## 2021-01-13 DIAGNOSIS — I25118 Atherosclerotic heart disease of native coronary artery with other forms of angina pectoris: Secondary | ICD-10-CM | POA: Diagnosis not present

## 2021-01-13 DIAGNOSIS — E1165 Type 2 diabetes mellitus with hyperglycemia: Secondary | ICD-10-CM

## 2021-01-13 DIAGNOSIS — I10 Essential (primary) hypertension: Secondary | ICD-10-CM | POA: Diagnosis not present

## 2021-01-17 ENCOUNTER — Encounter: Payer: Medicare Other | Admitting: Cardiology

## 2021-01-18 ENCOUNTER — Encounter: Payer: Self-pay | Admitting: Adult Health

## 2021-01-18 ENCOUNTER — Telehealth (INDEPENDENT_AMBULATORY_CARE_PROVIDER_SITE_OTHER): Payer: Medicare Other | Admitting: Adult Health

## 2021-01-18 VITALS — BP 134/80 | HR 61 | Temp 97.6°F | Ht 73.0 in | Wt 200.0 lb

## 2021-01-18 DIAGNOSIS — J019 Acute sinusitis, unspecified: Secondary | ICD-10-CM

## 2021-01-18 MED ORDER — DOXYCYCLINE HYCLATE 100 MG PO CAPS: 100.0000 mg | ORAL_CAPSULE | Freq: Two times a day (BID) | ORAL | 0 refills | Status: DC

## 2021-01-18 NOTE — Progress Notes (Signed)
Virtual Visit via Video Note  I connected with [name] [name] on 01/18/21 at  3:30 PM EST by a video enabled telemedicine application and verified that I am speaking with the correct person using two identifiers.  Location patient: home Location provider:work or home office Persons participating in the virtual visit: patient, provider  I discussed the limitations of evaluation and management by telemedicine and the availability of in person appointments. The patient expressed understanding and agreed to proceed.   HPI: 81 year old male who  has a past medical history of Aortic insufficiency, Arthritis, CAD (coronary artery disease), Chicken pox, Colon polyps, Diverticulitis, DM type 2 (diabetes mellitus, type 2) ([place]), Family history of adverse reaction to anesthesia, GERD (gastroesophageal reflux disease), Heart murmur, History of kidney stones, HOH (hard of hearing), Hyperlipidemia, Hypertension, Ischemic cardiomyopathy, Kidney stones, Melanoma ([place]) (1980), Mitral regurgitation, and Myocardial infarction ([place]).  He is being evaluated today for an acute issue. His symptoms started about 9 days ago. Symptoms include copious amounts of sinus drainage that appear discolored ( he is color blind), coughing spells,fatigue, and right sided ear fullness/pain. He denies fevers, chills, chest pain, or shortness of breath past baseline.   He has been using a [employer] and coricidin with improvement in symptoms but for a short time.   Does not feel like he is improving over the last week.     ROS: See pertinent positives and negatives per HPI.  Past Medical History:  Diagnosis Date   Aortic insufficiency    a. noted on TTE 2015; b. 06/2019 Echo: AI not visualized.   Arthritis    CAD (coronary artery disease)    a. remote PCI in 1991 and 2005; b. MV 3/15: old inferior MI, no ischemia, LVEF 50%, slight inferior wall hypokniesis; c. 06/2019 NSTEMI/PCI: LM nl, LAD 80p/m (Atherectomy & 4.5x18 Resolute  Onyx DES), 17m/d, D1 75 (PTCA), RI patent stent, LCX nl, RCA 100p, RPAV fills via L->R collats from LCX.   Chicken pox    Colon polyps    4 pre-cancerous    Diverticulitis    DM type 2 (diabetes mellitus, type 2) (HCC)    Family history of adverse reaction to anesthesia    GERD (gastroesophageal reflux disease)    Heart murmur    History of kidney stones    HOH (hard of hearing)    Hyperlipidemia    Hypertension    Ischemic cardiomyopathy    a. TTE 2015: EF  50-55%, mild global HK; b. 06/2019 Echo: EF 45-50%, Gr1 DD, basal inf AK. Triv MR.   Kidney stones    Melanoma ([place]) 1980   Resected from his back   Mitral regurgitation    a. noted on TTE 2015   Myocardial infarction [facility])     Past Surgical History:  Procedure Laterality Date   CHOLECYSTECTOMY  2012   COLONOSCOPY     in 2003 with polyp removed and leak anastomosis had to have open abdominal surgery    [place] & 2005   CORONARY ATHERECTOMY N/A 07/08/2019   Procedure: CORONARY ATHERECTOMY;  Surgeon: Jettie Booze, MD;  Location: [place] [facility];  Service: Cardiovascular;  Laterality: N/A;   CORONARY BALLOON ANGIOPLASTY N/A 07/08/2019   Procedure: CORONARY BALLOON ANGIOPLASTY;  Surgeon: Jettie Booze, MD;  Location: [place] [facility];  Service: Cardiovascular;  Laterality: N/A;  diagonal    CORONARY STENT INTERVENTION N/A 07/08/2019   Procedure: CORONARY STENT INTERVENTION;  Surgeon:  Jettie Booze, MD;  Location: [place] [facility];  Service: Cardiovascular;  Laterality: N/A;  lad   CYSTOSCOPY/URETEROSCOPY/HOLMIUM LASER/STENT PLACEMENT Right 01/02/2018   Procedure: CYSTOSCOPY/URETEROSCOPY/HOLMIUM LASER/STENT PLACEMENT;  Surgeon: [name], MD;  Location: [facility];  Service: Urology;  Laterality: Right;   ESOPHAGOGASTRODUODENOSCOPY (EGD) WITH PROPOFOL N/A 11/19/2016   Procedure: ESOPHAGOGASTRODUODENOSCOPY (EGD) WITH PROPOFOL;  Surgeon: [name], MD;   Location: [facility];  Service: Endoscopy;  Laterality: N/A;   ESOPHAGOGASTRODUODENOSCOPY (EGD) WITH PROPOFOL N/A 02/02/2019   Procedure: ESOPHAGOGASTRODUODENOSCOPY (EGD) WITH PROPOFOL;  Surgeon: [name], MD;  Location: [facility] ENDOS[facility];  Service: Endoscopy;  Laterality: N/A;   ICD IMPLANT N/A 02/02/2020   Procedure: ICD IMPLANT;  Surgeon: Vickie Epley, MD;  Location: [place] [facility];  Service: Cardiovascular;  Laterality: N/A;   INTRAVASCULAR PRESSURE WIRE/FFR STUDY N/A 07/07/2019   Procedure: INTRAVASCULAR PRESSURE WIRE/FFR STUDY;  Surgeon: Nelva Bush, MD;  Location: [place] [facility];  Service: Cardiovascular;  Laterality: N/A;   INTRAVASCULAR ULTRASOUND/IVUS N/A 07/08/2019   Procedure: Intravascular Ultrasound/IVUS;  Surgeon: Jettie Booze, MD;  Location: [place] [facility];  Service: Cardiovascular;  Laterality: N/A;   LEAD REVISION/REPAIR N/A 02/16/2020   Procedure: LEAD REVISION/REPAIR;  Surgeon: Constance Haw, MD;  Location: [place] [facility];  Service: Cardiovascular;  Laterality: N/A;   LEFT HEART CATH N/A 07/08/2019   Procedure: Left Heart Cath;  Surgeon: Jettie Booze, MD;  Location: [place] [facility];  Service: Cardiovascular;  Laterality: N/A;   LEFT HEART CATH AND CORONARY ANGIOGRAPHY N/A 07/07/2019   Procedure: LEFT HEART CATH AND CORONARY ANGIOGRAPHY;  Surgeon: Nelva Bush, MD;  Location: [place] [facility];  Service: Cardiovascular;  Laterality: N/A;   LITHOTRIPSY  2015   MELANOMA EXCISION  1980   malignant   NERVE SURGERY  2015   ulna nerve   TONSILLECTOMY  1945   WISDOM TOOTH EXTRACTION      Family History  Problem Relation Age of Onset   Hyperlipidemia Mother    Hypertension Mother    Heart disease Mother    Diabetes Mother    Heart attack Mother    Colon cancer Father        metasized to liver, adrenal, lungs   Lung cancer Father    Kidney cancer Father        malignant capsulated kidney tumor   Diabetes Father     Liver cancer Father    Bladder Cancer Neg Hx    Prostate cancer Neg Hx        Current Outpatient Medications:    acetaminophen (TYLENOL) 500 MG tablet, Take 1,000 mg by mouth every 6 (six) hours as needed (pain.)., Disp: , Rfl:    amiodarone (PACERONE) 200 MG tablet, Take 200 mg by mouth daily., Disp: , Rfl:    aspirin EC 81 MG tablet, Take 1 tablet (81 mg total) by mouth daily. Swallow whole., Disp: 90 tablet, Rfl: 3   carvedilol (COREG) 3.125 MG tablet, Take 1 tablet (3.125 mg total) by mouth 2 (two) times daily., Disp: 180 tablet, Rfl: 3   clopidogrel (PLAVIX) 75 MG tablet, Take 1 tablet (75 mg total) by mouth daily. PLEASE SCHEDULE OFFICE VISIT FOR FURTHER REFILLS. THANK YOU!, Disp: 30 tablet, Rfl: 0   ezetimibe (ZETIA) 10 MG tablet, TAKE 1 TABLET(10 MG) BY MOUTH DAILY, Disp: 90 tablet, Rfl: 1   famotidine (PEPCID) 20 MG tablet, Take 20 mg by mouth daily., Disp: , Rfl:    fluticasone (FLONASE)  50 MCG/ACT nasal spray, Place into both nostrils daily as needed for allergies or rhinitis., Disp: , Rfl:    glipiZIDE (GLUCOTROL) 10 MG tablet, Take glipizide 5mg  qam breakfast, and 10mg  qpm with supper., Disp: 120 tablet, Rfl: 3   lisinopril (ZESTRIL) 10 MG tablet, TAKE 1 TABLET BY MOUTH DAILY, Disp: 90 tablet, Rfl: 1   midodrine (PROAMATINE) 5 MG tablet, Take 1 tablet (5 mg total) by mouth 2 (two) times daily as needed. Take when BP does drop less than 110 when standing. Additional dose at 2 pm if still dropping below 110 when standing, Disp: 90 tablet, Rfl: 3   mometasone (ELOCON) 0.1 % cream, Apply to affected areas at elbow twice daily up to 5 days a week as needed. Avoid applying to face, groin, and axilla. Use as directed. Long-term use can cause thinning of the skin., Disp: 45 g, Rfl: 2   Multiple Vitamin (MULTIVITAMIN) tablet, Take 1 tablet by mouth daily., Disp: , Rfl:    nitroGLYCERIN (NITROSTAT) 0.4 MG SL tablet, Place 1 tablet (0.4 mg total) under the tongue every 5 (five) minutes x  3 doses as needed for chest pain., Disp: 25 tablet, Rfl: 0   Omega-3 Fatty Acids (FISH OIL) 1000 MG CAPS, Take 1 capsule by mouth daily., Disp: , Rfl:    ONETOUCH VERIO test strip, USE TO CHECK BLOOD SUGAR TWICE DAILY, Disp: 100 strip, Rfl: 1   pantoprazole (PROTONIX) 40 MG tablet, Take 1 tablet (40 mg total) by mouth daily., Disp: 30 tablet, Rfl: 6   Polyvinyl Alcohol-Povidone (MURINE TEARS FOR DRY EYES OP), Place 1-2 drops into both eyes as needed (for dry eyes). , Disp: , Rfl:    psyllium (METAMUCIL) 58.6 % powder, Take 1 packet by mouth at bedtime., Disp: , Rfl:    rosuvastatin (CRESTOR) 10 MG tablet, Take 1 tablet (10 mg total) by mouth daily., Disp: 90 tablet, Rfl: 0   Semaglutide, 1 MG/DOSE, (OZEMPIC, 1 MG/DOSE,) 4 MG/3ML SOPN, Inject 1 mg into the skin once a week., Disp: , Rfl:    sodium chloride (OCEAN) 0.65 % SOLN nasal spray, Place 1 spray into both nostrils as needed for congestion., Disp: , Rfl:    spironolactone (ALDACTONE) 25 MG tablet, TAKE 1 TABLET(25 MG) BY MOUTH DAILY, Disp: 90 tablet, Rfl: 0   tamsulosin (FLOMAX) 0.4 MG CAPS capsule, TAKE ONE CAPSULE BY MOUTH DAILY, Disp: 90 capsule, Rfl: 3   traMADol (ULTRAM) 50 MG tablet, TAKE 1 TABLET BY MOUTH EVERY 12 HOURS AS NEEDED, Disp: 60 tablet, Rfl: 2  EXAM:  VITALS per patient if applicable:  GENERAL: alert, oriented, appears well and in no acute distress  HEENT: atraumatic, conjunttiva clear, no obvious abnormalities on inspection of external nose and ears  NECK: normal movements of the [name] and neck  LUNGS: on inspection no signs of respiratory distress, breathing rate appears normal, no obvious gross SOB, gasping or wheezing  CV: no obvious cyanosis  MS: moves all visible extremities without noticeable abnormality  PSYCH/NEURO: pleasant and cooperative, no obvious depression or anxiety, speech and thought processing grossly intact  ASSESSMENT AND PLAN:  Discussed the following assessment and plan:  1. Acute  non-recurrent sinusitis, unspecified location - Will treat for sinusitis due to symptoms and duration  - Follow up with PCP if no improvement in the next 3-4 days or sooner if symptoms worsen.  - doxycycline (VIBRAMYCIN) 100 MG capsule; Take 1 capsule (100 mg total) by mouth 2 (two) times daily.  Dispense: 14 capsule;  Refill: 0  I discussed the assessment and treatment plan with the patient. The patient was provided an opportunity to ask questions and all were answered. The patient agreed with the plan and demonstrated an understanding of the instructions.   The patient was advised to call back or seek an in-person evaluation if the symptoms worsen or if the condition fails to improve as anticipated.   Dorothyann Peng, NP

## 2021-01-24 ENCOUNTER — Other Ambulatory Visit: Payer: Self-pay | Admitting: Cardiology

## 2021-01-24 NOTE — Telephone Encounter (Signed)
This is a [place] pt 

## 2021-01-26 ENCOUNTER — Other Ambulatory Visit: Payer: Self-pay | Admitting: Cardiovascular Disease

## 2021-01-26 NOTE — Telephone Encounter (Signed)
Please schedule F/U office visit with Dr. Rockey Situ. Thank you!

## 2021-01-30 ENCOUNTER — Other Ambulatory Visit: Payer: Self-pay

## 2021-01-30 ENCOUNTER — Other Ambulatory Visit: Payer: Self-pay | Admitting: Cardiovascular Disease

## 2021-01-30 ENCOUNTER — Ambulatory Visit (INDEPENDENT_AMBULATORY_CARE_PROVIDER_SITE_OTHER): Payer: Medicare Other

## 2021-01-30 DIAGNOSIS — I255 Ischemic cardiomyopathy: Secondary | ICD-10-CM | POA: Diagnosis not present

## 2021-01-30 LAB — CUP PACEART REMOTE DEVICE CHECK
Battery Remaining Longevity: 174 mo
Battery Remaining Percentage: 100 %
Brady Statistic RV Percent Paced: 1 %
Date Time Interrogation Session: 20230117002100
HighPow Impedance: 78 Ohm
Implantable Lead Implant Date: 20220202
Implantable Lead Location: 753860
Implantable Lead Model: 273
Implantable Lead Serial Number: 111255
Implantable Pulse Generator Implant Date: 20220119
Lead Channel Impedance Value: 417 Ohm
Lead Channel Pacing Threshold Amplitude: 0.9 V
Lead Channel Pacing Threshold Pulse Width: 0.4 ms
Lead Channel Setting Pacing Amplitude: 2 V
Lead Channel Setting Pacing Pulse Width: 0.4 ms
Lead Channel Setting Sensing Sensitivity: 0.5 mV
Pulse Gen Serial Number: 291993

## 2021-01-30 MED ORDER — EZETIMIBE 10 MG PO TABS: 10.0000 mg | ORAL_TABLET | Freq: Every day | ORAL | 0 refills | Status: DC

## 2021-01-31 ENCOUNTER — Other Ambulatory Visit: Payer: Self-pay

## 2021-01-31 ENCOUNTER — Encounter: Payer: Self-pay | Admitting: Cardiovascular Disease

## 2021-01-31 ENCOUNTER — Other Ambulatory Visit: Payer: Self-pay | Admitting: Cardiovascular Disease

## 2021-01-31 MED ORDER — EZETIMIBE 10 MG PO TABS: 10.0000 mg | ORAL_TABLET | Freq: Every day | ORAL | 2 refills | Status: DC

## 2021-02-04 NOTE — Progress Notes (Signed)
Date:  02/05/2021   ID:  [name], DOB 11-May-1940, MRN [id number]  Patient Location:  [address] [place] [address]   Provider location:   [facility], [place] office  PCP:  [name], [name]  Cardiologist:  Arvid Right [facility]  Chief Complaint  Patient presents with   6 month follow up     "Doing well." Medications reviewed by the patient verbally.      History of Present Illness:    [name] [name] is a 81 y.o. male  past medical history of CAD ,NSTEMI,  stenting was 52, and  2005 , 4/21, 6/05 May 2019 :atherectomy, drug-eluting stent  STENT RESOLUTE ONYX 4.5X18, postdilated LAD and angioplasty of diagonal VT followed by EP DM II HTN Hyperlipidemia Melanoma Abdominal aorta atherosclerosis ICD 01/2020 for EF 35% to 40% Who presents for routine follow-up of his coronary artery disease  Last seen in clinic by myself July 2022 Seen by EP August 02, 2020 for VT, ICD in situ Had no recurrent VT on amiodarone at that time  Systolic pressure low,  Last week 103/68 Mild orthostasis, not taking midodrine, maybe 2x in 6 months  Taking his medications as below Lisinopril in Am Spironolactone lunch Coreg BID  ICD downloads reviewed, no significant arrhythmia  Lab work reviewed A1c 6.4 LDL well controlled  EKG personally reviewed by myself on todays visit NSR with PVCs, rate 62 bpm  Other past medical history reviewed CT ABD/pelvis 07/11/2020 Mild diffuse aortic athero Small pericardial effusion, anterior  Followed by Dr. Quentin Ore, No sx of VT  Cath 06/2019:  Catheterization chronically occluded RCA collaterals from left to right, Severe proximal LAD disease 80%, stent placed. Underwent angioplasty of diagonal vessel, 75% lesion Patent stent in the ramus  Echo 12/2019 Left ventricular ejection fraction, by estimation, is 35 to 40%.\ ICD implant 02/02/2020  RV lead thresholds ahd risen significantly, was decided to  pursue lead revision with suspect microdislodgement.  Echocardiogram December 2021 Ejection fraction 35 to 40%  Dr. Quentin Ore noted his eliquis 2/2 LV thrombus that had since resolved and discontinued it.  Lab work reviewed, labs were 2022 hemoglobin A1c 7.1 Total cholesterol 124 LDL Normal CBC, BMP  Non-STEMI June 2021: Hospital Catheterization chronically occluded RCA collaterals from left to right Severe proximal LAD disease 80%, stent placed Angioplasty of diagonal vessel, 75% lesion  ZIO monitor with nonsustained VT, PVCs 10% burden  Cardiac MRI Ejection fraction 35%  LGE in the basal and mid inferior and inferior lateral walls, dyskinesis of the basal and inferior inferolateral walls and a left ventricular mural thrombus located on the inferolateral wall.   Past Medical History:  Diagnosis Date   Aortic insufficiency    a. noted on TTE 2015; b. 06/2019 Echo: AI not visualized.   Arthritis    CAD (coronary artery disease)    a. remote PCI in 1991 and 2005; b. MV 3/15: old inferior MI, no ischemia, LVEF 50%, slight inferior wall hypokniesis; c. 06/2019 NSTEMI/PCI: LM nl, LAD 80p/m (Atherectomy & 4.5x18 Resolute Onyx DES), 29m/d, D1 75 (PTCA), RI patent stent, LCX nl, RCA 100p, RPAV fills via L->R collats from LCX.   Chicken pox    Colon polyps    4 pre-cancerous    Diverticulitis    DM type 2 (diabetes mellitus, type 2) (HCC)    Family history of adverse reaction to anesthesia    GERD (gastroesophageal reflux disease)    Heart murmur  History of kidney stones    HOH (hard of hearing)    Hyperlipidemia    Hypertension    Ischemic cardiomyopathy    a. TTE 2015: EF  50-55%, mild global HK; b. 06/2019 Echo: EF 45-50%, Gr1 DD, basal inf AK. Triv MR.   Kidney stones    Melanoma ([place]) 1980   Resected from his back   Mitral regurgitation    a. noted on TTE 2015   Myocardial infarction [facility])    Past Surgical History:  Procedure Laterality Date   CHOLECYSTECTOMY  2012    COLONOSCOPY     in 2003 with polyp removed and leak anastomosis had to have open abdominal surgery    [place] & 2005   CORONARY ATHERECTOMY N/A 07/08/2019   Procedure: CORONARY ATHERECTOMY;  Surgeon: Jettie Booze, MD;  Location: [facility];  Service: Cardiovascular;  Laterality: N/A;   CORONARY BALLOON ANGIOPLASTY N/A 07/08/2019   Procedure: CORONARY BALLOON ANGIOPLASTY;  Surgeon: Jettie Booze, MD;  Location: [place] [facility];  Service: Cardiovascular;  Laterality: N/A;  diagonal    CORONARY STENT INTERVENTION N/A 07/08/2019   Procedure: CORONARY STENT INTERVENTION;  Surgeon: Jettie Booze, MD;  Location: [place] [facility];  Service: Cardiovascular;  Laterality: N/A;  lad   CYSTOSCOPY/URETEROSCOPY/HOLMIUM LASER/STENT PLACEMENT Right 01/02/2018   Procedure: CYSTOSCOPY/URETEROSCOPY/HOLMIUM LASER/STENT PLACEMENT;  Surgeon: [name], MD;  Location: [facility];  Service: Urology;  Laterality: Right;   ESOPHAGOGASTRODUODENOSCOPY (EGD) WITH PROPOFOL N/A 11/19/2016   Procedure: ESOPHAGOGASTRODUODENOSCOPY (EGD) WITH PROPOFOL;  Surgeon: [name], MD;  Location: [facility];  Service: Endoscopy;  Laterality: N/A;   ESOPHAGOGASTRODUODENOSCOPY (EGD) WITH PROPOFOL N/A 02/02/2019   Procedure: ESOPHAGOGASTRODUODENOSCOPY (EGD) WITH PROPOFOL;  Surgeon: [name], MD;  Location: [facility];  Service: Endoscopy;  Laterality: N/A;   ICD IMPLANT N/A 02/02/2020   Procedure: ICD IMPLANT;  Surgeon: Vickie Epley, MD;  Location: [place] [facility];  Service: Cardiovascular;  Laterality: N/A;   INTRAVASCULAR PRESSURE WIRE/FFR STUDY N/A 07/07/2019   Procedure: INTRAVASCULAR PRESSURE WIRE/FFR STUDY;  Surgeon: Nelva Bush, MD;  Location: [place] [facility];  Service: Cardiovascular;  Laterality: N/A;   INTRAVASCULAR ULTRASOUND/IVUS N/A 07/08/2019   Procedure: Intravascular Ultrasound/IVUS;  Surgeon: Jettie Booze, MD;   Location: [place] [facility];  Service: Cardiovascular;  Laterality: N/A;   LEAD REVISION/REPAIR N/A 02/16/2020   Procedure: LEAD REVISION/REPAIR;  Surgeon: Constance Haw, MD;  Location: [place] [facility];  Service: Cardiovascular;  Laterality: N/A;   LEFT HEART CATH N/A 07/08/2019   Procedure: Left Heart Cath;  Surgeon: Jettie Booze, MD;  Location: [place] [facility] LAB;  Service: Cardiovascular;  Laterality: N/A;   LEFT HEART CATH AND CORONARY ANGIOGRAPHY N/A 07/07/2019   Procedure: LEFT HEART CATH AND CORONARY ANGIOGRAPHY;  Surgeon: Nelva Bush, MD;  Location: [place] [facility];  Service: Cardiovascular;  Laterality: N/A;   LITHOTRIPSY  2015   MELANOMA EXCISION  1980   malignant   NERVE SURGERY  2015   ulna nerve   TONSILLECTOMY  1945   WISDOM TOOTH EXTRACTION       Current Meds  Medication Sig   acetaminophen (TYLENOL) 500 MG tablet Take 1,000 mg by mouth every 6 (six) hours as needed (pain.).   amiodarone (PACERONE) 200 MG tablet Take 200 mg by mouth daily.   aspirin EC 81 MG tablet Take 1 tablet (81 mg total) by mouth daily. Swallow whole.   carvedilol (  COREG) 3.125 MG tablet Take 1 tablet (3.125 mg total) by mouth 2 (two) times daily.   clopidogrel (PLAVIX) 75 MG tablet Take 1 tablet (75 mg total) by mouth daily. PLEASE SCHEDULE OFFICE VISIT FOR FURTHER REFILLS. THANK YOU!   ezetimibe (ZETIA) 10 MG tablet Take 1 tablet (10 mg total) by mouth daily.   famotidine (PEPCID) 20 MG tablet Take 20 mg by mouth daily.   fluticasone (FLONASE) 50 MCG/ACT nasal spray Place into both nostrils daily as needed for allergies or rhinitis.   glipiZIDE (GLUCOTROL) 10 MG tablet Take glipizide 5mg  qam breakfast, and 10mg  qpm with supper.   lisinopril (ZESTRIL) 10 MG tablet TAKE 1 TABLET BY MOUTH DAILY   midodrine (PROAMATINE) 5 MG tablet Take 1 tablet (5 mg total) by mouth 2 (two) times daily as needed. Take when BP does drop less than 110 when standing. Additional dose at 2 pm if  still dropping below 110 when standing   mometasone (ELOCON) 0.1 % cream Apply to affected areas at elbow twice daily up to 5 days a week as needed. Avoid applying to face, groin, and axilla. Use as directed. Long-term use can cause thinning of the skin.   Multiple Vitamin (MULTIVITAMIN) tablet Take 1 tablet by mouth daily.   nitroGLYCERIN (NITROSTAT) 0.4 MG SL tablet Place 1 tablet (0.4 mg total) under the tongue every 5 (five) minutes x 3 doses as needed for chest pain.   Omega-3 Fatty Acids (FISH OIL) 1000 MG CAPS Take 1 capsule by mouth daily.   ONETOUCH VERIO test strip USE TO CHECK BLOOD SUGAR TWICE DAILY   pantoprazole (PROTONIX) 40 MG tablet Take 1 tablet (40 mg total) by mouth daily.   Polyvinyl Alcohol-Povidone (MURINE TEARS FOR DRY EYES OP) Place 1-2 drops into both eyes as needed (for dry eyes).    psyllium (METAMUCIL) 58.6 % powder Take 1 packet by mouth at bedtime.   rosuvastatin (CRESTOR) 10 MG tablet TAKE 1 TABLET(10 MG) BY MOUTH DAILY   Semaglutide, 1 MG/DOSE, (OZEMPIC, 1 MG/DOSE,) 4 MG/3ML SOPN Inject 1 mg into the skin once a week.   sodium chloride (OCEAN) 0.65 % SOLN nasal spray Place 1 spray into both nostrils as needed for congestion.   spironolactone (ALDACTONE) 25 MG tablet TAKE 1 TABLET(25 MG) BY MOUTH DAILY   tamsulosin (FLOMAX) 0.4 MG CAPS capsule TAKE ONE CAPSULE BY MOUTH DAILY   traMADol (ULTRAM) 50 MG tablet TAKE 1 TABLET BY MOUTH EVERY 12 HOURS AS NEEDED     Allergies:   Metformin and related, Azithromycin, Other, Percocet [oxycodone-acetaminophen], and Jardiance [empagliflozin]   Social History   Tobacco Use   Smoking status: Some Days    Types: Pipe   Smokeless tobacco: Former    Quit date: 10/17/2015  Vaping Use   Vaping Use: Never used  Substance Use Topics   Alcohol use: Not Currently    Alcohol/week: 0.0 - 1.0 standard drinks   Drug use: No     Family Hx: The patient's family history includes Colon cancer in his father; Diabetes in his father  and mother; Heart attack in his mother; Heart disease in his mother; Hyperlipidemia in his mother; Hypertension in his mother; Kidney cancer in his father; Liver cancer in his father; Lung cancer in his father. There is no history of Bladder Cancer or Prostate cancer.  ROS:   Please see the history of present illness.    Review of Systems  Constitutional: Negative.   HENT: Negative.    Respiratory: Negative.  Cardiovascular: Negative.   Gastrointestinal: Negative.   Neurological: Negative.   Psychiatric/Behavioral: Negative.    All other systems reviewed and are negative.   Labs/Other Tests and Data Reviewed:    Recent Labs: 05/20/2020: Magnesium 1.4 08/02/2020: ALT 37; BUN 22; Creatinine, Ser 0.97; Potassium 5.1; Sodium 141; TSH 2.470 10/13/2020: Hemoglobin 14.5; Platelets 156.0   Recent Lipid Panel Lab Results  Component Value Date/Time   CHOL 124 12/21/2019 09:19 AM   TRIG 267 (H) 12/21/2019 09:19 AM   HDL 36 (L) 12/21/2019 09:19 AM   CHOLHDL 3.4 12/21/2019 09:19 AM   LDLCALC 35 12/21/2019 09:19 AM   LDLDIRECT 55.0 02/10/2019 09:03 AM    Wt Readings from Last 3 Encounters:  02/05/21 207 lb 8 oz (94.1 kg)  01/18/21 200 lb (90.7 kg)  10/30/20 208 lb (94.3 kg)     Exam:    BP 116/78 (BP Location: Left Arm, Patient Position: Sitting, Cuff Size: Normal)    Pulse 63    Ht 6\' 1"  (1.854 m)    Wt 207 lb 8 oz (94.1 kg)    SpO2 98%    BMI 27.38 kg/m   Constitutional:  oriented to person, place, and time. No distress.  HENT:  Head: Grossly normal Eyes:  no discharge. No scleral icterus.  Neck: No JVD, no carotid bruits  Cardiovascular: Regular rate and rhythm, no murmurs appreciated Pulmonary/Chest: Clear to auscultation bilaterally, no wheezes or rails Abdominal: Soft.  no distension.  no tenderness.  Musculoskeletal: Normal range of motion Neurological:  normal muscle tone. Coordination normal. No atrophy Skin: Skin warm and dry Psychiatric: normal affect,  pleasant  ASSESSMENT & PLAN:    Sustained VT Monitored through ICD downloads, no recent events On amio and coreg  Atherosclerosis of native coronary artery with stable angina pectoris, unspecified whether native or transplanted heart (HCC) Ischemic cardiomyopathy, ejection fraction 35% on MRI Echo 35 to 40% Continue carvedilol spironolactone and lisinopril Blood pressure very low  at home by report limiting additional medication options  Diarrhea:  Periodic symptoms  Cardiomyopathy, ischemic Coreg, lisinopril, spironolactone EF 35 to 40% As above, no changes made For low blood pressure recommended moving the lisinopril to the evening  Nonsustained VT, PVCs Has  ICD, followed EP Lead revision successful sustained VT requiring antitachycardia pacing  Atherosclerosis of abdominal aorta (HCC)  mild disease, home no claudication sx Lipids at goal  Type 2 diabetes mellitus with other circulatory complication, without long-term current use of insulin (HCC) Stable, off oxympic, GI issues A1c well controlled  Carotid artery disease, unspecified laterality ([place]) U/s in 2017 Minor carotid atherosclerosis.  No further work-up needed   Total encounter time more than 25 minutes  Greater than 50% was spent in counseling and coordination of care with the patient    Signed, Ida Rogue, MD  02/05/2021 8:48 AM    [place] [facility] [address], [place], [place] [address]

## 2021-02-05 ENCOUNTER — Other Ambulatory Visit: Payer: Self-pay

## 2021-02-05 ENCOUNTER — Ambulatory Visit: Payer: Medicare Other | Admitting: Cardiovascular Disease

## 2021-02-05 ENCOUNTER — Encounter: Payer: Self-pay | Admitting: Cardiovascular Disease

## 2021-02-05 VITALS — BP 116/78 | HR 63 | Ht 73.0 in | Wt 207.5 lb

## 2021-02-05 DIAGNOSIS — I25118 Atherosclerotic heart disease of native coronary artery with other forms of angina pectoris: Secondary | ICD-10-CM | POA: Diagnosis not present

## 2021-02-05 DIAGNOSIS — I5022 Chronic systolic (congestive) heart failure: Secondary | ICD-10-CM | POA: Diagnosis not present

## 2021-02-05 DIAGNOSIS — Z95810 Presence of automatic (implantable) cardiac defibrillator: Secondary | ICD-10-CM | POA: Diagnosis not present

## 2021-02-05 DIAGNOSIS — I472 Ventricular tachycardia, unspecified: Secondary | ICD-10-CM

## 2021-02-05 DIAGNOSIS — I255 Ischemic cardiomyopathy: Secondary | ICD-10-CM

## 2021-02-05 DIAGNOSIS — I10 Essential (primary) hypertension: Secondary | ICD-10-CM

## 2021-02-05 DIAGNOSIS — Z79899 Other long term (current) drug therapy: Secondary | ICD-10-CM | POA: Diagnosis not present

## 2021-02-05 NOTE — Patient Instructions (Addendum)
Medication Instructions:  No changes  If you need a refill on your cardiac medications before your next appointment, please call your pharmacy.    Lab work: No new labs needed   Testing/Procedures: No new testing needed   Follow-Up: At [facility], you and your health needs are our priority.  As part of our continuing mission to provide you with exceptional heart care, we have created designated Provider Care Teams.  These Care Teams include your primary Cardiologist (physician) and Advanced Practice Providers (APPs -  Physician Assistants and Nurse Practitioners) who all work together to provide you with the care you need, when you need it.  You will need a follow up appointment in 6 months  Providers on your designated Care Team:   Christopher Berge, NP Ryan Dunn, PA-C Cadence Furth, PA-C  COVID-19 Vaccine Information can be found at: [email or web address] For questions related to vaccine distribution or appointments, please email [email or web address] or call [phone].   

## 2021-02-07 ENCOUNTER — Ambulatory Visit (INDEPENDENT_AMBULATORY_CARE_PROVIDER_SITE_OTHER): Payer: Medicare Other | Admitting: Family

## 2021-02-07 ENCOUNTER — Other Ambulatory Visit: Payer: Self-pay

## 2021-02-07 VITALS — BP 108/62 | HR 71 | Temp 97.9°F | Ht 73.0 in | Wt 207.8 lb

## 2021-02-07 DIAGNOSIS — M545 Low back pain, unspecified: Secondary | ICD-10-CM

## 2021-02-07 DIAGNOSIS — I25118 Atherosclerotic heart disease of native coronary artery with other forms of angina pectoris: Secondary | ICD-10-CM | POA: Diagnosis not present

## 2021-02-07 DIAGNOSIS — Z23 Encounter for immunization: Secondary | ICD-10-CM | POA: Diagnosis not present

## 2021-02-07 DIAGNOSIS — I10 Essential (primary) hypertension: Secondary | ICD-10-CM

## 2021-02-07 DIAGNOSIS — E118 Type 2 diabetes mellitus with unspecified complications: Secondary | ICD-10-CM

## 2021-02-07 DIAGNOSIS — G8929 Other chronic pain: Secondary | ICD-10-CM | POA: Diagnosis not present

## 2021-02-07 LAB — COMPREHENSIVE METABOLIC PANEL
ALT: 52 U/L (ref 0–53)
AST: 56 U/L — ABNORMAL HIGH (ref 0–37)
Albumin: 3.7 g/dL (ref 3.5–5.2)
Alkaline Phosphatase: 172 U/L — ABNORMAL HIGH (ref 39–117)
BUN: 18 mg/dL (ref 6–23)
CO2: 26 mEq/L (ref 19–32)
Calcium: 9.1 mg/dL (ref 8.4–10.5)
Chloride: 102 mEq/L (ref 96–112)
Creatinine, Ser: 1 mg/dL (ref 0.40–1.50)
GFR: 70.81 mL/min (ref >60.00)
Glucose, Bld: 168 mg/dL — ABNORMAL HIGH (ref 70–99)
Potassium: 4.3 mEq/L (ref 3.5–5.1)
Sodium: 137 mEq/L (ref 135–145)
Total Bilirubin: 1 mg/dL (ref 0.2–1.2)
Total Protein: 6.6 g/dL (ref 6.0–8.3)

## 2021-02-07 LAB — CBC WITH DIFFERENTIAL/PLATELET
Basophils Absolute: 0 10*3/uL (ref 0.0–0.1)
Basophils Relative: 0.4 % (ref 0.0–3.0)
Eosinophils Absolute: 0.3 10*3/uL (ref 0.0–0.7)
Eosinophils Relative: 5.3 % — ABNORMAL HIGH (ref 0.0–5.0)
HCT: 42 % (ref 39.0–52.0)
Hemoglobin: 14 g/dL (ref 13.0–17.0)
Lymphocytes Relative: 24.3 % (ref 12.0–46.0)
Lymphs Abs: 1.2 10*3/uL (ref 0.7–4.0)
MCHC: 33.3 g/dL (ref 30.0–36.0)
MCV: 91.6 fl (ref 78.0–100.0)
Monocytes Absolute: 0.4 10*3/uL (ref 0.1–1.0)
Monocytes Relative: 8.3 % (ref 3.0–12.0)
Neutro Abs: 3.1 10*3/uL (ref 1.4–7.7)
Neutrophils Relative %: 61.7 % (ref 43.0–77.0)
Platelets: 171 10*3/uL (ref 150.0–400.0)
RBC: 4.59 Mil/uL (ref 4.22–5.81)
RDW: 14.5 % (ref 11.5–15.5)
WBC: 5 10*3/uL (ref 4.0–10.5)

## 2021-02-07 LAB — HEMOGLOBIN A1C: Hgb A1c MFr Bld: 6.7 % — ABNORMAL HIGH (ref 4.6–6.5)

## 2021-02-07 NOTE — Assessment & Plan Note (Signed)
Chronic, stable.  Continue tramadol 50 mg twice daily.  Discussed Tylenol PM at night to help with sleep as well as low back pain.  He denies sedation, falls, constipation or dry mouth.  He feels side effects are manageable to negligible at this time.  Advised he may continue as long as he remains vigilant for anticholinergic side effects

## 2021-02-07 NOTE — Progress Notes (Signed)
Subjective:    Patient ID: [name] [name], male    DOB: 1940/03/12, 81 y.o.   MRN: [id number]  CC: [name] [name] is a 81 y.o. male who presents today for follow up.   HPI: Feels well today No new complaints  99/64, HR 67, 118/79, HR 67, 109/68, HR 51, 121/74, HR 64.   Occasional dizziness if he stands quickly. No syncope, CP, palpitations, SOB. Drinking more water. He uses midodrine rarely.  Follow-up with Dr. Rockey Situ 02/05/2021 for sustained VT, atherosclerosis, ischemic cardiomyopathy.  He continues to follow with EP. He is compliant with Coreg 3.125mg  bid, lisinopril 10mg , spironolactone 25mg    DM-tolerating Ozempic 1mg .   He is compliant with glipizide 5mg  qam, 10 mg qpm  Chronic low back pain-compliant with tramadol 50mg  BID with tylenol PM at night. Pain is well managed at this time.   HLD-compliant with Zetia 10 mg, rosuvastatin 10 mg  Due PCV20  HISTORY:  Past Medical History:  Diagnosis Date   Aortic insufficiency    a. noted on TTE 2015; b. 06/2019 Echo: AI not visualized.   Arthritis    CAD (coronary artery disease)    a. remote PCI in 1991 and 2005; b. MV 3/15: old inferior MI, no ischemia, LVEF 50%, slight inferior wall hypokniesis; c. 06/2019 NSTEMI/PCI: LM nl, LAD 80p/m (Atherectomy & 4.5x18 Resolute Onyx DES), 92m/d, D1 75 (PTCA), RI patent stent, LCX nl, RCA 100p, RPAV fills via L->R collats from LCX.   Chicken pox    Colon polyps    4 pre-cancerous    Diverticulitis    DM type 2 (diabetes mellitus, type 2) (HCC)    Family history of adverse reaction to anesthesia    GERD (gastroesophageal reflux disease)    Heart murmur    History of kidney stones    HOH (hard of hearing)    Hyperlipidemia    Hypertension    Ischemic cardiomyopathy    a. TTE 2015: EF  50-55%, mild global HK; b. 06/2019 Echo: EF 45-50%, Gr1 DD, basal inf AK. Triv MR.   Kidney stones    Melanoma ([place]) 1980   Resected from his back   Mitral regurgitation    a.  noted on TTE 2015   Myocardial infarction [facility])    Past Surgical History:  Procedure Laterality Date   CHOLECYSTECTOMY  2012   COLONOSCOPY     in 2003 with polyp removed and leak anastomosis had to have open abdominal surgery    [place] & 2005   CORONARY ATHERECTOMY N/A 07/08/2019   Procedure: CORONARY ATHERECTOMY;  Surgeon: Jettie Booze, MD;  Location: [place] [facility];  Service: Cardiovascular;  Laterality: N/A;   CORONARY BALLOON ANGIOPLASTY N/A 07/08/2019   Procedure: CORONARY BALLOON ANGIOPLASTY;  Surgeon: Jettie Booze, MD;  Location: [place] [facility];  Service: Cardiovascular;  Laterality: N/A;  diagonal    CORONARY STENT INTERVENTION N/A 07/08/2019   Procedure: CORONARY STENT INTERVENTION;  Surgeon: Jettie Booze, MD;  Location: [place] [facility];  Service: Cardiovascular;  Laterality: N/A;  lad   CYSTOSCOPY/URETEROSCOPY/HOLMIUM LASER/STENT PLACEMENT Right 01/02/2018   Procedure: CYSTOSCOPY/URETEROSCOPY/HOLMIUM LASER/STENT PLACEMENT;  Surgeon: [name], MD;  Location: [facility] ORS;  Service: Urology;  Laterality: Right;   ESOPHAGOGASTRODUODENOSCOPY (EGD) WITH PROPOFOL N/A 11/19/2016   Procedure: ESOPHAGOGASTRODUODENOSCOPY (EGD) WITH PROPOFOL;  Surgeon: [name], MD;  Location: [facility] ENDOSCOPY;  Service: Endoscopy;  Laterality: N/A;   ESOPHAGOGASTRODUODENOSCOPY (EGD) WITH PROPOFOL N/A 02/02/2019  Procedure: ESOPHAGOGASTRODUODENOSCOPY (EGD) WITH PROPOFOL;  Surgeon: [name], MD;  Location: [facility];  Service: Endoscopy;  Laterality: N/A;   ICD IMPLANT N/A 02/02/2020   Procedure: ICD IMPLANT;  Surgeon: Vickie Epley, MD;  Location: [place] [facility];  Service: Cardiovascular;  Laterality: N/A;   INTRAVASCULAR PRESSURE WIRE/FFR STUDY N/A 07/07/2019   Procedure: INTRAVASCULAR PRESSURE WIRE/FFR STUDY;  Surgeon: Nelva Bush, MD;  Location: [place] [facility];  Service: Cardiovascular;   Laterality: N/A;   INTRAVASCULAR ULTRASOUND/IVUS N/A 07/08/2019   Procedure: Intravascular Ultrasound/IVUS;  Surgeon: Jettie Booze, MD;  Location: [place] [facility];  Service: Cardiovascular;  Laterality: N/A;   LEAD REVISION/REPAIR N/A 02/16/2020   Procedure: LEAD REVISION/REPAIR;  Surgeon: Constance Haw, MD;  Location: [place] CV [facility];  Service: Cardiovascular;  Laterality: N/A;   LEFT HEART CATH N/A 07/08/2019   Procedure: Left Heart Cath;  Surgeon: Jettie Booze, MD;  Location: [place] [facility];  Service: Cardiovascular;  Laterality: N/A;   LEFT HEART CATH AND CORONARY ANGIOGRAPHY N/A 07/07/2019   Procedure: LEFT HEART CATH AND CORONARY ANGIOGRAPHY;  Surgeon: Nelva Bush, MD;  Location: [place] [facility];  Service: Cardiovascular;  Laterality: N/A;   LITHOTRIPSY  2015   MELANOMA EXCISION  1980   malignant   NERVE SURGERY  2015   ulna nerve   TONSILLECTOMY  1945   WISDOM TOOTH EXTRACTION     Family History  Problem Relation Age of Onset   Hyperlipidemia Mother    Hypertension Mother    Heart disease Mother    Diabetes Mother    Heart attack Mother    Colon cancer Father        metasized to liver, adrenal, lungs   Lung cancer Father    Kidney cancer Father        malignant capsulated kidney tumor   Diabetes Father    Liver cancer Father    Bladder Cancer Neg Hx    Prostate cancer Neg Hx     Allergies: Metformin and related, Azithromycin, Other, Percocet [oxycodone-acetaminophen], and Jardiance [empagliflozin] Current Outpatient Medications on File Prior to Visit  Medication Sig Dispense Refill   acetaminophen (TYLENOL) 500 MG tablet Take 1,000 mg by mouth every 6 (six) hours as needed (pain.).     amiodarone (PACERONE) 200 MG tablet Take 200 mg by mouth daily.     aspirin EC 81 MG tablet Take 1 tablet (81 mg total) by mouth daily. Swallow whole. 90 tablet 3   carvedilol (COREG) 3.125 MG tablet Take 1 tablet (3.125 mg  total) by mouth 2 (two) times daily. 180 tablet 3   clopidogrel (PLAVIX) 75 MG tablet Take 1 tablet (75 mg total) by mouth daily. PLEASE SCHEDULE OFFICE VISIT FOR FURTHER REFILLS. THANK YOU! 30 tablet 0   ezetimibe (ZETIA) 10 MG tablet Take 1 tablet (10 mg total) by mouth daily. 90 tablet 2   famotidine (PEPCID) 20 MG tablet Take 20 mg by mouth daily.     fluticasone (FLONASE) 50 MCG/ACT nasal spray Place into both nostrils daily as needed for allergies or rhinitis.     glipiZIDE (GLUCOTROL) 10 MG tablet Take glipizide 5mg  qam breakfast, and 10mg  qpm with supper. 120 tablet 3   lisinopril (ZESTRIL) 10 MG tablet TAKE 1 TABLET BY MOUTH DAILY 90 tablet 1   midodrine (PROAMATINE) 5 MG tablet Take 1 tablet (5 mg total) by mouth 2 (two) times daily as needed. Take when BP does drop less than 110 when  standing. Additional dose at 2 pm if still dropping below 110 when standing 90 tablet 3   mometasone (ELOCON) 0.1 % cream Apply to affected areas at elbow twice daily up to 5 days a week as needed. Avoid applying to face, groin, and axilla. Use as directed. Long-term use can cause thinning of the skin. 45 g 2   Multiple Vitamin (MULTIVITAMIN) tablet Take 1 tablet by mouth daily.     nitroGLYCERIN (NITROSTAT) 0.4 MG SL tablet Place 1 tablet (0.4 mg total) under the tongue every 5 (five) minutes x 3 doses as needed for chest pain. 25 tablet 0   Omega-3 Fatty Acids (FISH OIL) 1000 MG CAPS Take 1 capsule by mouth daily.     ONETOUCH VERIO test strip USE TO CHECK BLOOD SUGAR TWICE DAILY 100 strip 1   pantoprazole (PROTONIX) 40 MG tablet Take 1 tablet (40 mg total) by mouth daily. 30 tablet 6   Polyvinyl Alcohol-Povidone (MURINE TEARS FOR DRY EYES OP) Place 1-2 drops into both eyes as needed (for dry eyes).      psyllium (METAMUCIL) 58.6 % powder Take 1 packet by mouth at bedtime.     rosuvastatin (CRESTOR) 10 MG tablet TAKE 1 TABLET(10 MG) BY MOUTH DAILY 90 tablet 0   Semaglutide, 1 MG/DOSE,  (OZEMPIC, 1 MG/DOSE,) 4 MG/3ML SOPN Inject 1 mg into the skin once a week.     sodium chloride (OCEAN) 0.65 % SOLN nasal spray Place 1 spray into both nostrils as needed for congestion.     spironolactone (ALDACTONE) 25 MG tablet TAKE 1 TABLET(25 MG) BY MOUTH DAILY 90 tablet 0   tamsulosin (FLOMAX) 0.4 MG CAPS capsule TAKE ONE CAPSULE BY MOUTH DAILY 90 capsule 3   traMADol (ULTRAM) 50 MG tablet TAKE 1 TABLET BY MOUTH EVERY 12 HOURS AS NEEDED 60 tablet 2   No current facility-administered medications on file prior to visit.    Social History   Tobacco Use   Smoking status: Some Days    Types: Pipe   Smokeless tobacco: Former    Quit date: 10/17/2015  Vaping Use   Vaping Use: Never used  Substance Use Topics   Alcohol use: Not Currently    Alcohol/week: 0.0 - 1.0 standard drinks   Drug use: No    Review of Systems  Constitutional:  Negative for chills and fever.  Respiratory:  Negative for cough.   Cardiovascular:  Negative for chest pain and palpitations.  Gastrointestinal:  Negative for nausea and vomiting.  Musculoskeletal:  Positive for back pain (chronic, controlled).  Psychiatric/Behavioral:  Negative for sleep disturbance (controlled).      Objective:    BP 108/62 (BP Location: Left Arm, Patient Position: Sitting, Cuff Size: Large)    Pulse 71    Temp 97.9 F (36.6 C) (Oral)    Ht 6\' 1"  (1.854 m)    Wt 207 lb 12.8 oz (94.3 kg)    SpO2 98%    BMI 27.42 kg/m  BP Readings from Last 3 Encounters:  02/07/21 108/62  02/05/21 116/78  01/18/21 134/80   Wt Readings from Last 3 Encounters:  02/07/21 207 lb 12.8 oz (94.3 kg)  02/05/21 207 lb 8 oz (94.1 kg)  01/18/21 200 lb (90.7 kg)    Physical Exam Vitals reviewed.  Constitutional:      Appearance: He is well-developed.  Cardiovascular:     Rate and Rhythm: Regular rhythm.     Heart sounds: Normal heart sounds.  Pulmonary:     Effort:  Pulmonary effort is normal. No respiratory distress.     Breath sounds:  Normal breath sounds. No wheezing, rhonchi or rales.  Skin:    General: Skin is warm and dry.  Neurological:     Mental Status: He is alert.  Psychiatric:        Speech: Speech normal.        Behavior: Behavior normal.       Assessment & Plan:   Problem List Items Addressed This Visit       Cardiovascular and Mediastinum   CAD (coronary artery disease)    Symptomatically stable and LDL historically well controlled.  Due to update lipid panel which I will do at follow-up.  Continue Zetia 10 mg, rosuvastatin 10 mg      Essential hypertension    Chronic, stable.  Rare use of midodrine.  Continue Coreg 3.125mg  bid, lisinopril 10mg , spironolactone 25mg  and follow up with cardiology and EP.        Endocrine   Controlled type 2 diabetes mellitus with complication, without long-term current use of insulin ([place]) - Primary    Pending A1c.  Continue Ozempic 1mg , glipizide 5mg  qam, 10 mg qpm      Relevant Orders   CBC with Differential/Platelet   Comprehensive metabolic panel   Hemoglobin A1c   Microalbumin / creatinine urine ratio     Other   Chronic low back pain    Chronic, stable.  Continue tramadol 50 mg twice daily.  Discussed Tylenol PM at night to help with sleep as well as low back pain.  He denies sedation, falls, constipation or dry mouth.  He feels side effects are manageable to negligible at this time.  Advised he may continue as long as he remains vigilant for anticholinergic side effects      Other Visit Diagnoses     Need for pneumococcal vaccination       Relevant Orders   Pneumococcal conjugate vaccine 20-valent (Prevnar 20) (Completed)        I have discontinued [name] Head "Ben"'s doxycycline. I am also having him maintain his psyllium, Polyvinyl Alcohol-Povidone (MURINE TEARS FOR DRY EYES OP), sodium chloride, nitroGLYCERIN, acetaminophen, fluticasone, aspirin EC, midodrine, pantoprazole, famotidine, amiodarone, multivitamin, OneTouch Verio, Fish  Oil, glipiZIDE, tamsulosin, lisinopril, traMADol, spironolactone, carvedilol, mometasone, Ozempic (1 MG/DOSE), clopidogrel, rosuvastatin, and ezetimibe.   No orders of the defined types were placed in this encounter.   Return precautions given.   Risks, benefits, and alternatives of the medications and treatment plan prescribed today were discussed, and patient expressed understanding.   Education regarding symptom management and diagnosis given to patient on AVS.  Continue to follow with [name], [name] for routine health maintenance.   [name] Head and I agreed with plan.   [name], [name]

## 2021-02-07 NOTE — Patient Instructions (Signed)
Nice to see you!   

## 2021-02-07 NOTE — Assessment & Plan Note (Signed)
Pending A1c.  Continue Ozempic 1mg , glipizide 5mg  qam, 10 mg qpm

## 2021-02-07 NOTE — Assessment & Plan Note (Addendum)
Symptomatically stable and LDL historically well controlled.  Due to update lipid panel which I will do at follow-up.  Continue Zetia 10 mg, rosuvastatin 10 mg

## 2021-02-07 NOTE — Assessment & Plan Note (Signed)
Chronic, stable.  Rare use of midodrine.  Continue Coreg 3.125mg  bid, lisinopril 10mg , spironolactone 25mg  and follow up with cardiology and EP.

## 2021-02-08 LAB — MICROALBUMIN / CREATININE URINE RATIO
Creatinine,U: 72.7 mg/dL
Microalb Creat Ratio: 2.1 mg/g (ref 0.0–30.0)
Microalb, Ur: 1.5 mg/dL (ref 0.0–1.9)

## 2021-02-09 ENCOUNTER — Other Ambulatory Visit: Payer: Self-pay | Admitting: Cardiovascular Disease

## 2021-02-11 ENCOUNTER — Encounter: Payer: Self-pay | Admitting: Cardiovascular Disease

## 2021-02-11 ENCOUNTER — Telehealth: Payer: Self-pay | Admitting: Internal Medicine

## 2021-02-11 NOTE — Telephone Encounter (Signed)
Patient called with concern about noticing heart rate at 40.  Also noted some fluctuating bp with some diastolic in the mid 57X.  He however is asymptomatic.  Patient has 81 yr old ICD with lower rate limit of 40.  Reassured patient given he is asymptomatic.  He will continue to monitor and follow up with primary on Monday.    Aneita Kiger A. Shirlee Latch, MD, MSc, [facility]

## 2021-02-12 ENCOUNTER — Telehealth: Payer: Self-pay

## 2021-02-12 ENCOUNTER — Other Ambulatory Visit: Payer: Self-pay | Admitting: Family

## 2021-02-12 DIAGNOSIS — R899 Unspecified abnormal finding in specimens from other organs, systems and tissues: Secondary | ICD-10-CM

## 2021-02-12 NOTE — Telephone Encounter (Signed)
noted 

## 2021-02-12 NOTE — Telephone Encounter (Signed)
Alert remote reviewed. Normal device function.   Alert for RV pacing > 50% of the time from 01/28/202301/29/2023.  Lower rate pacing is 40 bpm.  Sent to triage.   Patient reports of increased fatigue x 4-5 days ago. Presenting rhythm today is VP 40 bpm. Checked his BP this am ~ 147/68. Yesterday BP was 110/63 HR 78 bpm.   Patient reports he recieved his pneumonia shot Wednesday of last week (02/07/21) when the bradycardia started. Also reports sinus infection.   Advised patient I will forward to Dr. Quentin Ore for review and recommendations.

## 2021-02-12 NOTE — Progress Notes (Signed)
Remote ICD transmission.   

## 2021-02-13 ENCOUNTER — Telehealth: Payer: Self-pay | Admitting: Pharmacy Technician

## 2021-02-13 DIAGNOSIS — Z596 Low income: Secondary | ICD-10-CM

## 2021-02-13 NOTE — Progress Notes (Signed)
[place] [facility])                                            [place] Team    02/13/2021  Rocky Gladden 1940-02-21 [id number]  Received both patient and provider portion(s) of patient assistance application(s) for Ozempic. Faxed completed application and required documents into [employer].  Shaun Zuccaro P. Aviyah Swetz, [place]  [phone]

## 2021-02-14 NOTE — Telephone Encounter (Signed)
Vickie Epley, MD   Please have him hold the coreg. We need to get him in for an ECG & office visit. May need to discuss upgrade of his ICD system. Please schedule him in the next 1-2 weeks.  CL   Advised patient to hold coreg until he has visit with Dr. Quentin Ore. Scheduling will be calling him to set up appt. Patient verbalized agreement and understanding.

## 2021-02-15 ENCOUNTER — Ambulatory Visit: Payer: Medicare Other | Admitting: Podiatry

## 2021-02-15 ENCOUNTER — Encounter: Payer: Self-pay | Admitting: Podiatry

## 2021-02-15 ENCOUNTER — Other Ambulatory Visit: Payer: Self-pay

## 2021-02-15 DIAGNOSIS — E1140 Type 2 diabetes mellitus with diabetic neuropathy, unspecified: Secondary | ICD-10-CM | POA: Insufficient documentation

## 2021-02-15 DIAGNOSIS — E119 Type 2 diabetes mellitus without complications: Secondary | ICD-10-CM | POA: Diagnosis not present

## 2021-02-15 DIAGNOSIS — M79675 Pain in left toe(s): Secondary | ICD-10-CM

## 2021-02-15 DIAGNOSIS — B351 Tinea unguium: Secondary | ICD-10-CM

## 2021-02-15 DIAGNOSIS — E1142 Type 2 diabetes mellitus with diabetic polyneuropathy: Secondary | ICD-10-CM | POA: Diagnosis not present

## 2021-02-15 DIAGNOSIS — M79674 Pain in right toe(s): Secondary | ICD-10-CM

## 2021-02-15 NOTE — Progress Notes (Signed)
This patient returns to my office for at risk foot care.  This patient requires this care by a professional since this patient will be at risk due to having diabetes,He had seen Dr.  Sherryle Lis for previous nail care.  This patient is unable to cut nails himself since the patient cannot reach his nails.These nails are painful walking and wearing shoes.  This patient presents for at risk foot care today.  General Appearance  Alert, conversant and in no acute stress.  Vascular  Dorsalis pedis and posterior tibial  pulses are palpable  bilaterally.  Capillary return is within normal limits  bilaterally. Temperature is within normal limits  bilaterally.  Neurologic  Senn-Weinstein monofilament wire test diminished  bilaterally. Muscle power within normal limits bilaterally.  Nails Thick disfigured discolored nails with subungual debris  from hallux to fifth toes bilaterally. No evidence of bacterial infection or drainage bilaterally.  Orthopedic  No limitations of motion  feet .  No crepitus or effusions noted.  No bony pathology or digital deformities noted. HAV  B/L  Skin  normotropic skin with no porokeratosis noted bilaterally.  No signs of infections or ulcers noted.     Onychomycosis  Pain in right toes  Pain in left toes  Consent was obtained for treatment procedures.   Mechanical debridement of nails 1-5  bilaterally performed with a nail nipper.  Filed with dremel without incident. Patient qualifies for diabetic shoes due to DPN and HAV  B/L.   Return office visit    3 months                  Told patient to return for periodic foot care and evaluation due to potential at risk complications.   Gardiner Barefoot DPM

## 2021-02-16 ENCOUNTER — Telehealth: Payer: Self-pay | Admitting: Cardiology

## 2021-02-16 NOTE — Telephone Encounter (Signed)
STAT if HR is under 50 or over 120 (normal HR is 60-100 beats per minute)  What is your heart rate? 40  Do you have a log of your heart rate readings (document readings)? HR has been consistent for about 3 days at 40  Do you have any other symptoms? Fatigue, states his smart watch shows irregular EKGs

## 2021-02-16 NOTE — Telephone Encounter (Signed)
Called and spoke with patient.   Patient has been holding Coreg since Dr. Quentin Ore instructed him to do so on 1/30. Reports that his HR is still consistently reading 40, BP WNL.   Is concerned because he did 2 or 3 EKGs on his smart watch and was alerted that his rhythm was irregular.   Endorses fatigue, DOE. Denies chest pain, dizziness.   Recommended to pt that he transmit another tracing from his ICD. Pt states he will do so. States that he will also send EKGs from smart watch.   Re-forwarding message to device pool.

## 2021-02-16 NOTE — Telephone Encounter (Signed)
Called patient back, patient stated that his heart rate was in 40's after stopping coreg on 02/14/21 as advised, reviewed any kind of new medications or procedures patient might of had to cause low heart rate, patient sent transmission in at 1:00pm    Presenting shows VS with rate 40-42, Histogram shows clear decreases in heart rate averages   PPM programmed VVI 40   Patient denied any fainting or dizzy spells stated he just felt fatigued advised patient to not operate any heavy machinery or something that would cause harm if he were to pass out. Advised patient that should go to the ER if he were to pass out.   Patient voiced appointment with Dr. Quentin Ore on 02/21/21 in [place].

## 2021-02-19 ENCOUNTER — Other Ambulatory Visit: Payer: Self-pay

## 2021-02-19 ENCOUNTER — Other Ambulatory Visit: Payer: Self-pay | Admitting: Family

## 2021-02-19 MED ORDER — PANTOPRAZOLE SODIUM 40 MG PO TBEC: 40.0000 mg | DELAYED_RELEASE_TABLET | Freq: Every day | ORAL | 3 refills | Status: DC

## 2021-02-21 ENCOUNTER — Other Ambulatory Visit: Payer: Medicare Other

## 2021-02-21 ENCOUNTER — Encounter: Payer: Self-pay | Admitting: Family

## 2021-02-21 ENCOUNTER — Encounter: Payer: Medicare Other | Admitting: Cardiology

## 2021-02-21 ENCOUNTER — Encounter: Payer: Self-pay | Admitting: Cardiology

## 2021-02-21 ENCOUNTER — Ambulatory Visit (INDEPENDENT_AMBULATORY_CARE_PROVIDER_SITE_OTHER): Payer: Medicare Other | Admitting: Cardiology

## 2021-02-21 ENCOUNTER — Encounter: Payer: Self-pay | Admitting: *Deleted

## 2021-02-21 ENCOUNTER — Other Ambulatory Visit
Admission: RE | Admit: 2021-02-21 | Discharge: 2021-02-21 | Disposition: A | Discharge status: Home / Self Care | Payer: Medicare Other | Attending: Cardiology | Admitting: Cardiology

## 2021-02-21 ENCOUNTER — Other Ambulatory Visit: Payer: Self-pay

## 2021-02-21 VITALS — BP 144/70 | HR 40 | Ht 73.0 in | Wt 206.0 lb

## 2021-02-21 DIAGNOSIS — Z01818 Encounter for other preprocedural examination: Secondary | ICD-10-CM

## 2021-02-21 DIAGNOSIS — I472 Ventricular tachycardia, unspecified: Secondary | ICD-10-CM | POA: Diagnosis not present

## 2021-02-21 DIAGNOSIS — I5022 Chronic systolic (congestive) heart failure: Secondary | ICD-10-CM

## 2021-02-21 DIAGNOSIS — I442 Atrioventricular block, complete: Secondary | ICD-10-CM | POA: Diagnosis not present

## 2021-02-21 DIAGNOSIS — I25118 Atherosclerotic heart disease of native coronary artery with other forms of angina pectoris: Secondary | ICD-10-CM

## 2021-02-21 DIAGNOSIS — Z01812 Encounter for preprocedural laboratory examination: Secondary | ICD-10-CM | POA: Insufficient documentation

## 2021-02-21 LAB — CUP PACEART INCLINIC DEVICE CHECK
Date Time Interrogation Session: 20230208095158
HighPow Impedance: 81 Ohm
Implantable Lead Implant Date: 20220202
Implantable Lead Location: 753860
Implantable Lead Model: 273
Implantable Lead Serial Number: 111255
Implantable Pulse Generator Implant Date: 20220119
Lead Channel Impedance Value: 429 Ohm
Lead Channel Pacing Threshold Amplitude: 0.9 V
Lead Channel Pacing Threshold Pulse Width: 0.4 ms
Lead Channel Sensing Intrinsic Amplitude: 14.9 mV
Lead Channel Setting Pacing Amplitude: 2 V
Lead Channel Setting Pacing Pulse Width: 0.4 ms
Lead Channel Setting Sensing Sensitivity: 0.5 mV
Pulse Gen Serial Number: 291993

## 2021-02-21 LAB — CBC WITH DIFFERENTIAL/PLATELET
Abs Immature Granulocytes: 0.01 10*3/uL (ref 0.00–0.07)
Basophils Absolute: 0 10*3/uL (ref 0.0–0.1)
Basophils Relative: 1 %
Eosinophils Absolute: 0.2 10*3/uL (ref 0.0–0.5)
Eosinophils Relative: 4 %
HCT: 42.7 % (ref 39.0–52.0)
Hemoglobin: 14.3 g/dL (ref 13.0–17.0)
Immature Granulocytes: 0 %
Lymphocytes Relative: 26 %
Lymphs Abs: 1.6 10*3/uL (ref 0.7–4.0)
MCH: 30.6 pg (ref 26.0–34.0)
MCHC: 33.5 g/dL (ref 30.0–36.0)
MCV: 91.4 fL (ref 80.0–100.0)
Monocytes Absolute: 0.6 10*3/uL (ref 0.1–1.0)
Monocytes Relative: 9 %
Neutro Abs: 3.9 10*3/uL (ref 1.7–7.7)
Neutrophils Relative %: 60 %
Platelets: 196 10*3/uL (ref 150–400)
RBC: 4.67 MIL/uL (ref 4.22–5.81)
RDW: 14.6 % (ref 11.5–15.5)
WBC: 6.4 10*3/uL (ref 4.0–10.5)
nRBC: 0 % (ref 0.0–0.2)

## 2021-02-21 LAB — BASIC METABOLIC PANEL
Anion gap: 7 (ref 5–15)
BUN: 30 mg/dL — ABNORMAL HIGH (ref 8–23)
CO2: 22 mmol/L (ref 22–32)
Calcium: 9.2 mg/dL (ref 8.9–10.3)
Chloride: 103 mmol/L (ref 98–111)
Creatinine, Ser: 1.24 mg/dL (ref 0.61–1.24)
GFR, Estimated: 58 mL/min — ABNORMAL LOW (ref >60)
Glucose, Bld: 184 mg/dL — ABNORMAL HIGH (ref 70–99)
Potassium: 4.8 mmol/L (ref 3.5–5.1)
Sodium: 132 mmol/L — ABNORMAL LOW (ref 135–145)

## 2021-02-21 NOTE — Patient Instructions (Addendum)
Medications: Your physician recommends that you continue on your current medications as directed. Please refer to the Current Medication list given to you today. *If you need a refill on your cardiac medications before your next appointment, please call your pharmacy*  Lab Work: None. If you have labs (blood work) drawn today and your tests are completely normal, you will receive your results only by: [place] (if you have MyChart) OR A paper copy in the mail If you have any lab test that is abnormal or we need to change your treatment, we will call you to review the results.  Testing/Procedures: None.  Follow-Up: At [facility], you and your health needs are our priority.  As part of our continuing mission to provide you with exceptional heart care, we have created designated Provider Care Teams.  These Care Teams include your primary Cardiologist (physician) and Advanced Practice Providers (APPs -  Physician Assistants and Nurse Practitioners) who all work together to provide you with the care you need, when you need it.  Your physician wants you to follow-up in: 10- 14 days with Device Clinic post op and 91 days post op with Dr. Quentin Ore. (Procedure Feb 13)  Remote monitoring is used to monitor your ICD from home. This monitoring reduces the number of office visits required to check your device to one time per year. It allows [place] to keep an eye on the functioning of your device to ensure it is working properly. You are scheduled for a device check from home on 05/01/21. You may send your transmission at any time that day. If you have a wireless device, the transmission will be sent automatically. After your physician reviews your transmission, you will receive a postcard with your next transmission date.  We recommend signing up for the patient portal called "MyChart".  Sign up information is provided on this After Visit Summary.  MyChart is used to connect with patients for Virtual  Visits (Telemedicine).  Patients are able to view lab/test results, encounter notes, upcoming appointments, etc.  Non-urgent messages can be sent to your provider as well.   To learn more about what you can do with MyChart, go to [email or web address].    Any Other Special Instructions Will Be Listed Below (If Applicable).  Cardioverter Defibrillator Implantation An implantable cardioverter defibrillator (ICD) is a device that identifies and corrects abnormal heart rhythms. Cardioverter defibrillator implantation is a surgery to place an ICD under the skin in the chest or abdomen. An ICD has a battery, a small computer (pulse generator), and wires (leads) that go into the heart. The ICD detects and corrects two types of dangerous irregular heart rhythms (arrhythmias): A rapid heart rhythm in the lower chambers of the heart (ventricles). This is called ventricular tachycardia. The ventricles contracting in an uncoordinated way. This is called ventricular fibrillation. There are different types of ICDs, and the electrical signals from the ICD can be programmed differently based on the condition being treated. The electrical signals from the ICD can be low-energy pulses, high-energy shocks, or a combination of the two. The low-energy pulses are generally used to restore the heartbeat to normal when it is either too slow (bradycardia) or too fast. These pulses are painless. The high-energy shocks are used to treat abnormal rhythms such as ventricular tachycardia or ventricular fibrillation. This shock may feel like a strong jolt in the chest. Your health care provider may recommend an ICD if you have: Had a ventricular arrhythmia in the past. A damaged heart  because of a disease or heart condition. A weakened heart muscle from a heart attack or cardiac arrest. A congenital heart defect. Long QT syndrome, which is a disorder of the heart's electrical system. Brugada syndrome, which is a condition that  causes a disruption of the heart's normal rhythm. Tell a health care provider about: Any allergies you have. All medicines you are taking, including vitamins, herbs, eye drops, creams, and over-the-counter medicines. Any problems you or family members have had with anesthetic medicines. Any blood disorders you have. Any surgeries you have had. Any medical conditions you have. Whether you are pregnant or may be pregnant. What are the risks? Generally, this is a safe procedure. However, problems may occur, including: Infection. Bleeding. Allergic reactions to medicines used during the procedure. Blood clots. Swelling or bruising. Damage to nearby structures or organs, such as nerves, lungs, blood vessels, or the heart where the ICD leads or pulse generator is implanted. What happens before the procedure? Staying hydrated Follow instructions from your health care provider about hydration, which may include: Up to 2 hours before the procedure - you may continue to drink clear liquids, such as water, clear fruit juice, black coffee, and plain tea.  Eating and drinking restrictions Follow instructions from your health care provider about eating and drinking, which may include: 8 hours before the procedure - stop eating heavy meals or foods, such as meat, fried foods, or fatty foods. 6 hours before the procedure - stop eating light meals or foods, such as toast or cereal. 6 hours before the procedure - stop drinking milk or drinks that contain milk. 2 hours before the procedure - stop drinking clear liquids. Medicines Ask your health care provider about: Changing or stopping your regular medicines. This is especially important if you are taking diabetes medicines or blood thinners. Taking medicines such as aspirin and ibuprofen. These medicines can thin your blood. Do not take these medicines unless your health care provider tells you to take them. Taking over-the-counter medicines,  vitamins, herbs, and supplements. Tests You may have an exam or testing. These may include: Blood tests. A test to check the electrical signals in your heart (electrocardiogram, ECG). Imaging tests, such as a chest X-ray. Echocardiogram. This is an ultrasound of your heart to evaluate your heart structures and function. An event monitor or Holter monitor to wear at home. General instructions Do not use any products that contain nicotine or tobacco for at least 4 weeks before the procedure. These products include cigarettes, chewing tobacco, and vaping devices, such as e-cigarettes. If you need help quitting, ask your health care provider. Ask your health care provider: How your procedure site will be marked. What steps will be taken to help prevent infection. These may include: Removing hair at the surgery site. Washing skin with a germ-killing soap. Taking antibiotic medicine. You may be asked to shower with a germ-killing soap. Plan to have a responsible adult take you home from the hospital or clinic. What happens during the procedure?  Small monitors will be put on your body. They will be used to check your heart rate, blood pressure, and oxygen level. A pair of sticky pads (defibrillator pads) may be placed on your back and chest. These pads are able to pace your heart as needed during the procedure. An IV will be inserted into one of your veins. You will be given one or more of the following: A medicine to help you relax (sedative). A medicine to numb the area (  local anesthetic). A medicine to make you fall asleep(general anesthetic). A small incision will be made to create a deep pocket under the skin of your chest or abdomen. Leads will be guided through a blood vessel into your heart and attached to your heart muscles. Depending on the ICD, the leads may go into one ventricle, or they may go into both ventricles and into an upper chamber of the heart. An X-ray machine  (fluoroscope) will be used to help guide the leads. The other end of the leads will be attached to the pulse generator. The pulse generator will be placed into the pocket under the skin. The ICD will be tested, and your health care provider will program the ICD for the condition being treated. The incision will be closed with stitches (sutures), skin glue, adhesive strips, or staples. A bandage (dressing) will be placed over the incision. The procedure may vary among health care providers and hospitals. What happens after the procedure? Your blood pressure, heart rate, breathing rate, and blood oxygen level will be monitored until you leave the hospital or clinic. Your health care provider will also monitor your ICD to make sure it is working properly. A chest X-ray will be taken to check that the ICD is in the right place. Do not raise the arm on the side of your procedure higher than your shoulder for as long as told by your health care provider. This is usually at least 6 weeks. You may be given an identification card explaining that you have an ICD. You will be given a remote home monitoring device to use with your ICD to allow your device to communicate with your clinic. Summary An implantable cardioverter defibrillator (ICD) is a device that identifies and corrects abnormal heart rhythms. Cardioverter defibrillator implantation is a surgery to place an ICD under the skin in the chest or abdomen. An ICD consists of a battery, a small computer (pulse generator), and wires (leads) that go into the heart. During the procedure, the ICD will be tested, and your health care provider will program the ICD for the condition being treated. After the procedure, a chest X-ray will be taken to check that the ICD is in the right place. This information is not intended to replace advice given to you by your health care provider. Make sure you discuss any questions you have with your health care  provider. Document Revised: 06/30/2019 Document Reviewed: 06/30/2019 Elsevier Patient Education  [place].

## 2021-02-21 NOTE — H&P (View-Only) (Signed)
Electrophysiology Office Follow up Visit Note:    Date:  02/21/2021   ID:  [name] [name], DOB [birth year], MRN [id number]  PCP:  [name], [facility] Cardiologist:  Ida Rogue, MD  [facility] Electrophysiologist:  Vickie Epley, MD    Interval History:    [name] [name] is a 81 y.o. male who presents for a follow up visit. Since I saw him his heart rates have decreased despite stopping beta blockers. He presents today for an evaluation of these low heart rates.  He is with his wife today in clinic.  He tells me he is felt fatigued and short of breath ever since he had pneumonia and received a pneumonia shot in late 2022.  He feels like his heart rate is "stuck" at 40 bpm.     Past Medical History:  Diagnosis Date   Aortic insufficiency    a. noted on TTE 2015; b. 06/2019 Echo: AI not visualized.   Arthritis    CAD (coronary artery disease)    a. remote PCI in 1991 and 2005; b. MV 3/15: old inferior MI, no ischemia, LVEF 50%, slight inferior wall hypokniesis; c. 06/2019 NSTEMI/PCI: LM nl, LAD 80p/m (Atherectomy & 4.5x18 Resolute Onyx DES), 77m/d, D1 75 (PTCA), RI patent stent, LCX nl, RCA 100p, RPAV fills via L->R collats from LCX.   Chicken pox    Colon polyps    4 pre-cancerous    Diverticulitis    DM type 2 (diabetes mellitus, type 2) (HCC)    Family history of adverse reaction to anesthesia    GERD (gastroesophageal reflux disease)    Heart murmur    History of kidney stones    HOH (hard of hearing)    Hyperlipidemia    Hypertension    Ischemic cardiomyopathy    a. TTE 2015: EF  50-55%, mild global HK; b. 06/2019 Echo: EF 45-50%, Gr1 DD, basal inf AK. Triv MR.   Kidney stones    Melanoma ([place]) 1980   Resected from his back   Mitral regurgitation    a. noted on TTE 2015   Myocardial infarction [facility])     Past Surgical History:  Procedure Laterality Date   CHOLECYSTECTOMY  2012   COLONOSCOPY     in 2003 with polyp removed and leak  anastomosis had to have open abdominal surgery    [place] & 2005   CORONARY ATHERECTOMY N/A 07/08/2019   Procedure: CORONARY ATHERECTOMY;  Surgeon: Jettie Booze, MD;  Location: [place] [facility];  Service: Cardiovascular;  Laterality: N/A;   CORONARY BALLOON ANGIOPLASTY N/A 07/08/2019   Procedure: CORONARY BALLOON ANGIOPLASTY;  Surgeon: Jettie Booze, MD;  Location: [place] [facility];  Service: Cardiovascular;  Laterality: N/A;  diagonal    CORONARY STENT INTERVENTION N/A 07/08/2019   Procedure: CORONARY STENT INTERVENTION;  Surgeon: Jettie Booze, MD;  Location: [place] [facility];  Service: Cardiovascular;  Laterality: N/A;  lad   CYSTOSCOPY/URETEROSCOPY/HOLMIUM LASER/STENT PLACEMENT Right 01/02/2018   Procedure: CYSTOSCOPY/URETEROSCOPY/HOLMIUM LASER/STENT PLACEMENT;  Surgeon: [name], MD;  Location: [facility];  Service: Urology;  Laterality: Right;   ESOPHAGOGASTRODUODENOSCOPY (EGD) WITH PROPOFOL N/A 11/19/2016   Procedure: ESOPHAGOGASTRODUODENOSCOPY (EGD) WITH PROPOFOL;  Surgeon: [name], MD;  Location: [facility];  Service: Endoscopy;  Laterality: N/A;   ESOPHAGOGASTRODUODENOSCOPY (EGD) WITH PROPOFOL N/A 02/02/2019   Procedure: ESOPHAGOGASTRODUODENOSCOPY (EGD) WITH PROPOFOL;  Surgeon: [name], MD;  Location: [facility];  Service: Endoscopy;  Laterality:  N/A;   ICD IMPLANT N/A 02/02/2020   Procedure: ICD IMPLANT;  Surgeon: Vickie Epley, MD;  Location: [place] [facility];  Service: Cardiovascular;  Laterality: N/A;   INTRAVASCULAR PRESSURE WIRE/FFR STUDY N/A 07/07/2019   Procedure: INTRAVASCULAR PRESSURE WIRE/FFR STUDY;  Surgeon: Nelva Bush, MD;  Location: [place] [facility];  Service: Cardiovascular;  Laterality: N/A;   INTRAVASCULAR ULTRASOUND/IVUS N/A 07/08/2019   Procedure: Intravascular Ultrasound/IVUS;  Surgeon: Jettie Booze, MD;  Location: [place] [facility];  Service:  Cardiovascular;  Laterality: N/A;   LEAD REVISION/REPAIR N/A 02/16/2020   Procedure: LEAD REVISION/REPAIR;  Surgeon: Constance Haw, MD;  Location: [place] [facility];  Service: Cardiovascular;  Laterality: N/A;   LEFT HEART CATH N/A 07/08/2019   Procedure: Left Heart Cath;  Surgeon: Jettie Booze, MD;  Location: [place] [facility];  Service: Cardiovascular;  Laterality: N/A;   LEFT HEART CATH AND CORONARY ANGIOGRAPHY N/A 07/07/2019   Procedure: LEFT HEART CATH AND CORONARY ANGIOGRAPHY;  Surgeon: Nelva Bush, MD;  Location: [place] [facility];  Service: Cardiovascular;  Laterality: N/A;   LITHOTRIPSY  2015   MELANOMA EXCISION  1980   malignant   NERVE SURGERY  2015   ulna nerve   TONSILLECTOMY  1945   WISDOM TOOTH EXTRACTION      Current Medications: Current Meds  Medication Sig   acetaminophen (TYLENOL) 500 MG tablet Take 1,000 mg by mouth every 6 (six) hours as needed (pain.).   amiodarone (PACERONE) 200 MG tablet Take 200 mg by mouth daily.   aspirin EC 81 MG tablet Take 1 tablet (81 mg total) by mouth daily. Swallow whole.   bisacodyl (DULCOLAX) 5 MG EC tablet Take 5 mg by mouth daily as needed for moderate constipation.   carvedilol (COREG) 3.125 MG tablet Take 1 tablet (3.125 mg total) by mouth 2 (two) times daily.   clopidogrel (PLAVIX) 75 MG tablet TAKE 1 TABLET(75 MG) BY MOUTH DAILY   ezetimibe (ZETIA) 10 MG tablet Take 1 tablet (10 mg total) by mouth daily.   famotidine (PEPCID) 20 MG tablet Take 20 mg by mouth daily.   fluticasone (FLONASE) 50 MCG/ACT nasal spray Place into both nostrils daily as needed for allergies or rhinitis.   glipiZIDE (GLUCOTROL) 10 MG tablet Take glipizide 5mg  qam breakfast, and 10mg  qpm with supper.   lisinopril (ZESTRIL) 10 MG tablet TAKE 1 TABLET BY MOUTH DAILY   midodrine (PROAMATINE) 5 MG tablet Take 1 tablet (5 mg total) by mouth 2 (two) times daily as needed. Take when BP does drop less than 110 when standing. Additional dose at  2 pm if still dropping below 110 when standing   mometasone (ELOCON) 0.1 % cream Apply to affected areas at elbow twice daily up to 5 days a week as needed. Avoid applying to face, groin, and axilla. Use as directed. Long-term use can cause thinning of the skin.   Multiple Vitamin (MULTIVITAMIN) tablet Take 1 tablet by mouth daily.   nitroGLYCERIN (NITROSTAT) 0.4 MG SL tablet Place 1 tablet (0.4 mg total) under the tongue every 5 (five) minutes x 3 doses as needed for chest pain.   Omega-3 Fatty Acids (FISH OIL) 1000 MG CAPS Take 1 capsule by mouth daily.   ONETOUCH VERIO test strip USE TO CHECK BLOOD SUGAR TWICE DAILY   pantoprazole (PROTONIX) 40 MG tablet Take 1 tablet (40 mg total) by mouth daily.   Polyvinyl Alcohol-Povidone (MURINE TEARS FOR DRY EYES OP) Place 1-2 drops into both  eyes as needed (for dry eyes).    psyllium (METAMUCIL) 58.6 % powder Take 1 packet by mouth at bedtime.   rosuvastatin (CRESTOR) 10 MG tablet TAKE 1 TABLET(10 MG) BY MOUTH DAILY   Semaglutide, 1 MG/DOSE, (OZEMPIC, 1 MG/DOSE,) 4 MG/3ML SOPN Inject 1 mg into the skin once a week.   sodium chloride (OCEAN) 0.65 % SOLN nasal spray Place 1 spray into both nostrils as needed for congestion.   spironolactone (ALDACTONE) 25 MG tablet TAKE 1 TABLET(25 MG) BY MOUTH DAILY   tamsulosin (FLOMAX) 0.4 MG CAPS capsule TAKE ONE CAPSULE BY MOUTH DAILY   traMADol (ULTRAM) 50 MG tablet TAKE 1 TABLET BY MOUTH EVERY 12 HOURS AS NEEDED     Allergies:   Metformin and related, Azithromycin, Other, Percocet [oxycodone-acetaminophen], and Jardiance [empagliflozin]   Social History   Socioeconomic History   Marital status: Married    Spouse name: Golden Circle   Number of children: Not on file   Years of education: Not on file   Highest education level: Not on file  Occupational History   Not on file  Tobacco Use   Smoking status: Some Days    Types: Pipe   Smokeless tobacco: Former    Quit date: 10/17/2015  Vaping Use   Vaping Use:  Never used  Substance and Sexual Activity   Alcohol use: Not Currently    Alcohol/week: 0.0 - 1.0 standard drinks   Drug use: No   Sexual activity: Not on file  Other Topics Concern   Not on file  Social History Narrative   Married    Social Determinants of Health   Financial Resource Strain: Medium Risk   Difficulty of Paying Living Expenses: Somewhat hard  Food Insecurity: No Food Insecurity   Worried About Charity fundraiser in the Last Year: Never true   Ran Out of Food in the Last Year: Never true  Transportation Needs: No Transportation Needs   Lack of Transportation (Medical): No   Lack of Transportation (Non-Medical): No  Physical Activity: Insufficiently Active   Days of Exercise per Week: 3 days   Minutes of Exercise per Session: 30 min  Stress: No Stress Concern Present   Feeling of Stress : Not at all  Social Connections: Unknown   Frequency of Communication with Friends and Family: Not on file   Frequency of Social Gatherings with Friends and Family: Not on file   Attends Religious Services: Not on Electrical engineer or Organizations: Not on file   Attends Archivist Meetings: Not on file   Marital Status: Married     Family History: The patient's family history includes Colon cancer in his father; Diabetes in his father and mother; Heart attack in his mother; Heart disease in his mother; Hyperlipidemia in his mother; Hypertension in his mother; Kidney cancer in his father; Liver cancer in his father; Lung cancer in his father. There is no history of Bladder Cancer or Prostate cancer.  ROS:   Please see the history of present illness.    All other systems reviewed and are negative.  EKGs/Labs/Other Studies Reviewed:    The following studies were reviewed today:  ICD interrogation personally reviewed today Heart rate 40 bpm Lead parameters stable Turned on rate response today  EKG:  The ekg ordered today demonstrates complete heart  block.  V pacing at 40 bpm.  Recent Labs: 05/20/2020: Magnesium 1.4 08/02/2020: TSH 2.470 02/07/2021: ALT 52; BUN 18; Creatinine, Ser 1.00; Hemoglobin  14.0; Platelets 171.0; Potassium 4.3; Sodium 137  Recent Lipid Panel    Component Value Date/Time   CHOL 124 12/21/2019 0919   TRIG 267 (H) 12/21/2019 0919   HDL 36 (L) 12/21/2019 0919   CHOLHDL 3.4 12/21/2019 0919   VLDL 53 (H) 12/21/2019 0919   LDLCALC 35 12/21/2019 0919   LDLDIRECT 55.0 02/10/2019 0903    Physical Exam:    VS:  BP (!) 144/70 (BP Location: Left Arm, Patient Position: Sitting, Cuff Size: Normal)    Pulse (!) 40    Ht 6\' 1"  (1.854 m)    Wt 206 lb (93.4 kg)    SpO2 95%    BMI 27.18 kg/m     Wt Readings from Last 3 Encounters:  02/21/21 206 lb (93.4 kg)  02/07/21 207 lb 12.8 oz (94.3 kg)  02/05/21 207 lb 8 oz (94.1 kg)     GEN:  Well nourished, well developed in no acute distress HEENT: Normal NECK: No JVD; No carotid bruits LYMPHATICS: No lymphadenopathy CARDIAC: RRR, no murmurs, rubs, gallops.  ICD pocket well-healed RESPIRATORY:  Clear to auscultation without rales, wheezing or rhonchi  ABDOMEN: Soft, non-tender, non-distended MUSCULOSKELETAL:  No edema; No deformity  SKIN: Warm and dry NEUROLOGIC:  Alert and oriented x 3 PSYCHIATRIC:  Normal affect        ASSESSMENT:    1. Chronic systolic heart failure ([place])   2. VT (ventricular tachycardia)   3. Atherosclerosis of native coronary artery of native heart with stable angina pectoris ([place])    PLAN:    In order of problems listed above:  #Complete heart block New within the last month.  Not on beta-blockers but continues to be in complete heart block.  He is symptomatic because of the AV dyssynchrony and RV pacing.  He will require upgrade to CRT-D.  I discussed the upgrade procedure today with the patient his wife include the risks and possibility that the left subclavian venous system could be occluded.  If that is the case on venogram, would stop  and plan to regroup to discuss extraction and upgrade.  We will get this scheduled in the next couple of weeks.  He should hold his Plavix for 5 days prior to implant (stenting was in June 2021).  He will continue aspirin uninterrupted.  Risks, benefits, alternatives to CRT-D upgrade to his current VVI ICD were discussed in detail with the patient today. The patient understands that the risks include but are not limited to bleeding, infection, pneumothorax, perforation, tamponade, vascular damage, renal failure, MI, stroke, death, and lead dislodgement and wishes to proceed.  We will therefore schedule device implantation at the next available time.  #VT No recent episodes  #Coronary artery disease Continue aspirin.  Hold Plavix for 5 days prior to upgrade.  He will stay overnight after his upgrade procedure.    Total time spent with patient today 45 minutes. This includes reviewing records, evaluating the patient and coordinating care.   Medication Adjustments/Labs and Tests Ordered: Current medicines are reviewed at length with the patient today.  Concerns regarding medicines are outlined above.  Orders Placed This Encounter  Procedures   EKG 12-Lead   No orders of the defined types were placed in this encounter.    Signed, Lars Mage, MD, [facility], [facility] 02/21/2021 9:25 AM    Electrophysiology [facility]

## 2021-02-21 NOTE — Progress Notes (Signed)
Electrophysiology Office Follow up Visit Note:    Date:  02/21/2021   ID:  [name] [name], DOB 1941/01/11, MRN [id number]  PCP:  [name], [facility] Cardiologist:  Ida Rogue, MD  [facility] Electrophysiologist:  Vickie Epley, MD    Interval History:    [name] [name] is a 81 y.o. male who presents for a follow up visit. Since I saw him his heart rates have decreased despite stopping beta blockers. He presents today for an evaluation of these low heart rates.  He is with his wife today in clinic.  He tells me he is felt fatigued and short of breath ever since he had pneumonia and received a pneumonia shot in late 2022.  He feels like his heart rate is "stuck" at 40 bpm.     Past Medical History:  Diagnosis Date   Aortic insufficiency    a. noted on TTE 2015; b. 06/2019 Echo: AI not visualized.   Arthritis    CAD (coronary artery disease)    a. remote PCI in 1991 and 2005; b. MV 3/15: old inferior MI, no ischemia, LVEF 50%, slight inferior wall hypokniesis; c. 06/2019 NSTEMI/PCI: LM nl, LAD 80p/m (Atherectomy & 4.5x18 Resolute Onyx DES), 53m/d, D1 75 (PTCA), RI patent stent, LCX nl, RCA 100p, RPAV fills via L->R collats from LCX.   Chicken pox    Colon polyps    4 pre-cancerous    Diverticulitis    DM type 2 (diabetes mellitus, type 2) (HCC)    Family history of adverse reaction to anesthesia    GERD (gastroesophageal reflux disease)    Heart murmur    History of kidney stones    HOH (hard of hearing)    Hyperlipidemia    Hypertension    Ischemic cardiomyopathy    a. TTE 2015: EF  50-55%, mild global HK; b. 06/2019 Echo: EF 45-50%, Gr1 DD, basal inf AK. Triv MR.   Kidney stones    Melanoma ([place]) 1980   Resected from his back   Mitral regurgitation    a. noted on TTE 2015   Myocardial infarction [facility])     Past Surgical History:  Procedure Laterality Date   CHOLECYSTECTOMY  2012   COLONOSCOPY     in 2003 with polyp removed and leak  anastomosis had to have open abdominal surgery    [place] & 2005   CORONARY ATHERECTOMY N/A 07/08/2019   Procedure: CORONARY ATHERECTOMY;  Surgeon: Jettie Booze, MD;  Location: [place] [facility];  Service: Cardiovascular;  Laterality: N/A;   CORONARY BALLOON ANGIOPLASTY N/A 07/08/2019   Procedure: CORONARY BALLOON ANGIOPLASTY;  Surgeon: Jettie Booze, MD;  Location: [place] [facility];  Service: Cardiovascular;  Laterality: N/A;  diagonal    CORONARY STENT INTERVENTION N/A 07/08/2019   Procedure: CORONARY STENT INTERVENTION;  Surgeon: Jettie Booze, MD;  Location: [place] [facility];  Service: Cardiovascular;  Laterality: N/A;  lad   CYSTOSCOPY/URETEROSCOPY/HOLMIUM LASER/STENT PLACEMENT Right 01/02/2018   Procedure: CYSTOSCOPY/URETEROSCOPY/HOLMIUM LASER/STENT PLACEMENT;  Surgeon: [name], MD;  Location: [facility];  Service: Urology;  Laterality: Right;   ESOPHAGOGASTRODUODENOSCOPY (EGD) WITH PROPOFOL N/A 11/19/2016   Procedure: ESOPHAGOGASTRODUODENOSCOPY (EGD) WITH PROPOFOL;  Surgeon: [name], MD;  Location: [facility];  Service: Endoscopy;  Laterality: N/A;   ESOPHAGOGASTRODUODENOSCOPY (EGD) WITH PROPOFOL N/A 02/02/2019   Procedure: ESOPHAGOGASTRODUODENOSCOPY (EGD) WITH PROPOFOL;  Surgeon: [name], MD;  Location: [facility];  Service: Endoscopy;  Laterality:  N/A;   ICD IMPLANT N/A 02/02/2020   Procedure: ICD IMPLANT;  Surgeon: Vickie Epley, MD;  Location: [place] [facility];  Service: Cardiovascular;  Laterality: N/A;   INTRAVASCULAR PRESSURE WIRE/FFR STUDY N/A 07/07/2019   Procedure: INTRAVASCULAR PRESSURE WIRE/FFR STUDY;  Surgeon: Nelva Bush, MD;  Location: [place] [facility];  Service: Cardiovascular;  Laterality: N/A;   INTRAVASCULAR ULTRASOUND/IVUS N/A 07/08/2019   Procedure: Intravascular Ultrasound/IVUS;  Surgeon: Jettie Booze, MD;  Location: [place] [facility];  Service:  Cardiovascular;  Laterality: N/A;   LEAD REVISION/REPAIR N/A 02/16/2020   Procedure: LEAD REVISION/REPAIR;  Surgeon: Constance Haw, MD;  Location: [place] [facility];  Service: Cardiovascular;  Laterality: N/A;   LEFT HEART CATH N/A 07/08/2019   Procedure: Left Heart Cath;  Surgeon: Jettie Booze, MD;  Location: [place] [facility];  Service: Cardiovascular;  Laterality: N/A;   LEFT HEART CATH AND CORONARY ANGIOGRAPHY N/A 07/07/2019   Procedure: LEFT HEART CATH AND CORONARY ANGIOGRAPHY;  Surgeon: Nelva Bush, MD;  Location: [place] [facility];  Service: Cardiovascular;  Laterality: N/A;   LITHOTRIPSY  2015   MELANOMA EXCISION  1980   malignant   NERVE SURGERY  2015   ulna nerve   TONSILLECTOMY  1945   WISDOM TOOTH EXTRACTION      Current Medications: Current Meds  Medication Sig   acetaminophen (TYLENOL) 500 MG tablet Take 1,000 mg by mouth every 6 (six) hours as needed (pain.).   amiodarone (PACERONE) 200 MG tablet Take 200 mg by mouth daily.   aspirin EC 81 MG tablet Take 1 tablet (81 mg total) by mouth daily. Swallow whole.   bisacodyl (DULCOLAX) 5 MG EC tablet Take 5 mg by mouth daily as needed for moderate constipation.   carvedilol (COREG) 3.125 MG tablet Take 1 tablet (3.125 mg total) by mouth 2 (two) times daily.   clopidogrel (PLAVIX) 75 MG tablet TAKE 1 TABLET(75 MG) BY MOUTH DAILY   ezetimibe (ZETIA) 10 MG tablet Take 1 tablet (10 mg total) by mouth daily.   famotidine (PEPCID) 20 MG tablet Take 20 mg by mouth daily.   fluticasone (FLONASE) 50 MCG/ACT nasal spray Place into both nostrils daily as needed for allergies or rhinitis.   glipiZIDE (GLUCOTROL) 10 MG tablet Take glipizide 5mg  qam breakfast, and 10mg  qpm with supper.   lisinopril (ZESTRIL) 10 MG tablet TAKE 1 TABLET BY MOUTH DAILY   midodrine (PROAMATINE) 5 MG tablet Take 1 tablet (5 mg total) by mouth 2 (two) times daily as needed. Take when BP does drop less than 110 when standing. Additional dose at  2 pm if still dropping below 110 when standing   mometasone (ELOCON) 0.1 % cream Apply to affected areas at elbow twice daily up to 5 days a week as needed. Avoid applying to face, groin, and axilla. Use as directed. Long-term use can cause thinning of the skin.   Multiple Vitamin (MULTIVITAMIN) tablet Take 1 tablet by mouth daily.   nitroGLYCERIN (NITROSTAT) 0.4 MG SL tablet Place 1 tablet (0.4 mg total) under the tongue every 5 (five) minutes x 3 doses as needed for chest pain.   Omega-3 Fatty Acids (FISH OIL) 1000 MG CAPS Take 1 capsule by mouth daily.   ONETOUCH VERIO test strip USE TO CHECK BLOOD SUGAR TWICE DAILY   pantoprazole (PROTONIX) 40 MG tablet Take 1 tablet (40 mg total) by mouth daily.   Polyvinyl Alcohol-Povidone (MURINE TEARS FOR DRY EYES OP) Place 1-2 drops into both  eyes as needed (for dry eyes).    psyllium (METAMUCIL) 58.6 % powder Take 1 packet by mouth at bedtime.   rosuvastatin (CRESTOR) 10 MG tablet TAKE 1 TABLET(10 MG) BY MOUTH DAILY   Semaglutide, 1 MG/DOSE, (OZEMPIC, 1 MG/DOSE,) 4 MG/3ML SOPN Inject 1 mg into the skin once a week.   sodium chloride (OCEAN) 0.65 % SOLN nasal spray Place 1 spray into both nostrils as needed for congestion.   spironolactone (ALDACTONE) 25 MG tablet TAKE 1 TABLET(25 MG) BY MOUTH DAILY   tamsulosin (FLOMAX) 0.4 MG CAPS capsule TAKE ONE CAPSULE BY MOUTH DAILY   traMADol (ULTRAM) 50 MG tablet TAKE 1 TABLET BY MOUTH EVERY 12 HOURS AS NEEDED     Allergies:   Metformin and related, Azithromycin, Other, Percocet [oxycodone-acetaminophen], and Jardiance [empagliflozin]   Social History   Socioeconomic History   Marital status: Married    Spouse name: Golden Circle   Number of children: Not on file   Years of education: Not on file   Highest education level: Not on file  Occupational History   Not on file  Tobacco Use   Smoking status: Some Days    Types: Pipe   Smokeless tobacco: Former    Quit date: 10/17/2015  Vaping Use   Vaping Use:  Never used  Substance and Sexual Activity   Alcohol use: Not Currently    Alcohol/week: 0.0 - 1.0 standard drinks   Drug use: No   Sexual activity: Not on file  Other Topics Concern   Not on file  Social History Narrative   Married    Social Determinants of Health   Financial Resource Strain: Medium Risk   Difficulty of Paying Living Expenses: Somewhat hard  Food Insecurity: No Food Insecurity   Worried About Charity fundraiser in the Last Year: Never true   Ran Out of Food in the Last Year: Never true  Transportation Needs: No Transportation Needs   Lack of Transportation (Medical): No   Lack of Transportation (Non-Medical): No  Physical Activity: Insufficiently Active   Days of Exercise per Week: 3 days   Minutes of Exercise per Session: 30 min  Stress: No Stress Concern Present   Feeling of Stress : Not at all  Social Connections: Unknown   Frequency of Communication with Friends and Family: Not on file   Frequency of Social Gatherings with Friends and Family: Not on file   Attends Religious Services: Not on Electrical engineer or Organizations: Not on file   Attends Archivist Meetings: Not on file   Marital Status: Married     Family History: The patient's family history includes Colon cancer in his father; Diabetes in his father and mother; Heart attack in his mother; Heart disease in his mother; Hyperlipidemia in his mother; Hypertension in his mother; Kidney cancer in his father; Liver cancer in his father; Lung cancer in his father. There is no history of Bladder Cancer or Prostate cancer.  ROS:   Please see the history of present illness.    All other systems reviewed and are negative.  EKGs/Labs/Other Studies Reviewed:    The following studies were reviewed today:  ICD interrogation personally reviewed today Heart rate 40 bpm Lead parameters stable Turned on rate response today  EKG:  The ekg ordered today demonstrates complete heart  block.  V pacing at 40 bpm.  Recent Labs: 05/20/2020: Magnesium 1.4 08/02/2020: TSH 2.470 02/07/2021: ALT 52; BUN 18; Creatinine, Ser 1.00; Hemoglobin  14.0; Platelets 171.0; Potassium 4.3; Sodium 137  Recent Lipid Panel    Component Value Date/Time   CHOL 124 12/21/2019 0919   TRIG 267 (H) 12/21/2019 0919   HDL 36 (L) 12/21/2019 0919   CHOLHDL 3.4 12/21/2019 0919   VLDL 53 (H) 12/21/2019 0919   LDLCALC 35 12/21/2019 0919   LDLDIRECT 55.0 02/10/2019 0903    Physical Exam:    VS:  BP (!) 144/70 (BP Location: Left Arm, Patient Position: Sitting, Cuff Size: Normal)    Pulse (!) 40    Ht 6\' 1"  (1.854 m)    Wt 206 lb (93.4 kg)    SpO2 95%    BMI 27.18 kg/m     Wt Readings from Last 3 Encounters:  02/21/21 206 lb (93.4 kg)  02/07/21 207 lb 12.8 oz (94.3 kg)  02/05/21 207 lb 8 oz (94.1 kg)     GEN:  Well nourished, well developed in no acute distress HEENT: Normal NECK: No JVD; No carotid bruits LYMPHATICS: No lymphadenopathy CARDIAC: RRR, no murmurs, rubs, gallops.  ICD pocket well-healed RESPIRATORY:  Clear to auscultation without rales, wheezing or rhonchi  ABDOMEN: Soft, non-tender, non-distended MUSCULOSKELETAL:  No edema; No deformity  SKIN: Warm and dry NEUROLOGIC:  Alert and oriented x 3 PSYCHIATRIC:  Normal affect        ASSESSMENT:    1. Chronic systolic heart failure ([place])   2. VT (ventricular tachycardia)   3. Atherosclerosis of native coronary artery of native heart with stable angina pectoris ([place])    PLAN:    In order of problems listed above:  #Complete heart block New within the last month.  Not on beta-blockers but continues to be in complete heart block.  He is symptomatic because of the AV dyssynchrony and RV pacing.  He will require upgrade to CRT-D.  I discussed the upgrade procedure today with the patient his wife include the risks and possibility that the left subclavian venous system could be occluded.  If that is the case on venogram, would stop  and plan to regroup to discuss extraction and upgrade.  We will get this scheduled in the next couple of weeks.  He should hold his Plavix for 5 days prior to implant (stenting was in June 2021).  He will continue aspirin uninterrupted.  Risks, benefits, alternatives to CRT-D upgrade to his current VVI ICD were discussed in detail with the patient today. The patient understands that the risks include but are not limited to bleeding, infection, pneumothorax, perforation, tamponade, vascular damage, renal failure, MI, stroke, death, and lead dislodgement and wishes to proceed.  We will therefore schedule device implantation at the next available time.  #VT No recent episodes  #Coronary artery disease Continue aspirin.  Hold Plavix for 5 days prior to upgrade.  He will stay overnight after his upgrade procedure.    Total time spent with patient today 45 minutes. This includes reviewing records, evaluating the patient and coordinating care.   Medication Adjustments/Labs and Tests Ordered: Current medicines are reviewed at length with the patient today.  Concerns regarding medicines are outlined above.  Orders Placed This Encounter  Procedures   EKG 12-Lead   No orders of the defined types were placed in this encounter.    Signed, Lars Mage, MD, [facility], [facility] 02/21/2021 9:25 AM    Electrophysiology [facility]

## 2021-02-23 ENCOUNTER — Other Ambulatory Visit: Payer: Medicare Other

## 2021-02-23 NOTE — Pre-Procedure Instructions (Signed)
Instructed patient on the following items: Arrival time 1200 Nothing to eat or drink after midnight No meds AM of procedure Responsible person to drive you home and stay with you for 24 hrs Wash with special soap night before and morning of procedure If on anti-coagulant drug instructions Plavix- last dose 2/9

## 2021-02-26 ENCOUNTER — Ambulatory Visit (HOSPITAL_COMMUNITY)
Admission: RE | Admit: 2021-02-26 | Discharge: 2021-02-27 | Disposition: A | Discharge status: Home / Self Care | Payer: Medicare Other | Attending: Cardiology | Admitting: Cardiology

## 2021-02-26 ENCOUNTER — Encounter (HOSPITAL_COMMUNITY)
Admission: RE | Disposition: A | Discharge status: Home / Self Care | Payer: Medicare Other | Source: Home / Self Care | Attending: Cardiology

## 2021-02-26 ENCOUNTER — Other Ambulatory Visit: Payer: Self-pay

## 2021-02-26 ENCOUNTER — Telehealth: Payer: Self-pay | Admitting: *Deleted

## 2021-02-26 DIAGNOSIS — I252 Old myocardial infarction: Secondary | ICD-10-CM | POA: Insufficient documentation

## 2021-02-26 DIAGNOSIS — I25118 Atherosclerotic heart disease of native coronary artery with other forms of angina pectoris: Secondary | ICD-10-CM | POA: Diagnosis not present

## 2021-02-26 DIAGNOSIS — E119 Type 2 diabetes mellitus without complications: Secondary | ICD-10-CM | POA: Insufficient documentation

## 2021-02-26 DIAGNOSIS — I255 Ischemic cardiomyopathy: Secondary | ICD-10-CM | POA: Insufficient documentation

## 2021-02-26 DIAGNOSIS — Z20822 Contact with and (suspected) exposure to covid-19: Secondary | ICD-10-CM | POA: Insufficient documentation

## 2021-02-26 DIAGNOSIS — Z87891 Personal history of nicotine dependence: Secondary | ICD-10-CM | POA: Diagnosis not present

## 2021-02-26 DIAGNOSIS — I110 Hypertensive heart disease with heart failure: Secondary | ICD-10-CM | POA: Insufficient documentation

## 2021-02-26 DIAGNOSIS — Z7984 Long term (current) use of oral hypoglycemic drugs: Secondary | ICD-10-CM | POA: Diagnosis not present

## 2021-02-26 DIAGNOSIS — Z7985 Long-term (current) use of injectable non-insulin antidiabetic drugs: Secondary | ICD-10-CM | POA: Insufficient documentation

## 2021-02-26 DIAGNOSIS — I472 Ventricular tachycardia, unspecified: Secondary | ICD-10-CM | POA: Diagnosis not present

## 2021-02-26 DIAGNOSIS — I442 Atrioventricular block, complete: Secondary | ICD-10-CM | POA: Insufficient documentation

## 2021-02-26 DIAGNOSIS — I5022 Chronic systolic (congestive) heart failure: Secondary | ICD-10-CM | POA: Insufficient documentation

## 2021-02-26 DIAGNOSIS — Z95810 Presence of automatic (implantable) cardiac defibrillator: Secondary | ICD-10-CM | POA: Diagnosis present

## 2021-02-26 DIAGNOSIS — Z4502 Encounter for adjustment and management of automatic implantable cardiac defibrillator: Secondary | ICD-10-CM | POA: Insufficient documentation

## 2021-02-26 HISTORY — PX: BIV UPGRADE: EP1202

## 2021-02-26 LAB — GLUCOSE, CAPILLARY: Glucose-Capillary: 114 mg/dL — ABNORMAL HIGH (ref 70–99)

## 2021-02-26 LAB — SARS CORONAVIRUS 2 BY RT PCR (HOSPITAL ORDER, PERFORMED IN ~~LOC~~ HOSPITAL LAB): SARS Coronavirus 2: NEGATIVE

## 2021-02-26 SURGERY — BIV UPGRADE

## 2021-02-26 MED ORDER — FENTANYL CITRATE (PF) 100 MCG/2ML IJ SOLN
INTRAMUSCULAR | Status: AC
  Filled 2021-02-26: qty 2

## 2021-02-26 MED ORDER — MIDAZOLAM HCL 5 MG/5ML IJ SOLN
INTRAMUSCULAR | Status: DC | PRN
  Administered 2021-02-26 (×2): 1 mg via INTRAVENOUS

## 2021-02-26 MED ORDER — AMIODARONE HCL 200 MG PO TABS
200.0000 mg | ORAL_TABLET | Freq: Every day | ORAL | Status: DC
  Filled 2021-02-26: qty 1

## 2021-02-26 MED ORDER — SODIUM CHLORIDE 0.9 % IV SOLN: INTRAVENOUS | Status: DC

## 2021-02-26 MED ORDER — MIDAZOLAM HCL 5 MG/5ML IJ SOLN
INTRAMUSCULAR | Status: AC
  Filled 2021-02-26: qty 5

## 2021-02-26 MED ORDER — SPIRONOLACTONE 25 MG PO TABS: 25.0000 mg | ORAL_TABLET | Freq: Every day | ORAL | Status: DC

## 2021-02-26 MED ORDER — FENTANYL CITRATE (PF) 100 MCG/2ML IJ SOLN
INTRAMUSCULAR | Status: DC | PRN
  Administered 2021-02-26 (×2): 25 ug via INTRAVENOUS

## 2021-02-26 MED ORDER — CHLORHEXIDINE GLUCONATE 4 % EX LIQD: 4.0000 "application " | Freq: Once | CUTANEOUS | Status: DC

## 2021-02-26 MED ORDER — CEFAZOLIN SODIUM-DEXTROSE 2-4 GM/100ML-% IV SOLN
2.0000 g | INTRAVENOUS | Status: AC
  Administered 2021-02-26: 2 g via INTRAVENOUS

## 2021-02-26 MED ORDER — LIDOCAINE HCL (PF) 1 % IJ SOLN
INTRAMUSCULAR | Status: DC | PRN
  Administered 2021-02-26: 55 mL

## 2021-02-26 MED ORDER — HEPARIN (PORCINE) IN NACL 1000-0.9 UT/500ML-% IV SOLN
INTRAVENOUS | Status: DC | PRN
  Administered 2021-02-26: 500 mL

## 2021-02-26 MED ORDER — HYDROMORPHONE HCL 2 MG PO TABS
1.0000 mg | ORAL_TABLET | Freq: Once | ORAL | Status: AC
  Administered 2021-02-26: 1 mg via ORAL
  Filled 2021-02-26: qty 1

## 2021-02-26 MED ORDER — LIDOCAINE HCL 1 % IJ SOLN
INTRAMUSCULAR | Status: AC
  Filled 2021-02-26: qty 60

## 2021-02-26 MED ORDER — LISINOPRIL 10 MG PO TABS: 10.0000 mg | ORAL_TABLET | Freq: Every day | ORAL | Status: DC

## 2021-02-26 MED ORDER — PANTOPRAZOLE SODIUM 40 MG PO TBEC: 40.0000 mg | DELAYED_RELEASE_TABLET | Freq: Every day | ORAL | Status: DC

## 2021-02-26 MED ORDER — POVIDONE-IODINE 10 % EX SWAB: 2.0000 "application " | Freq: Once | CUTANEOUS | Status: DC

## 2021-02-26 MED ORDER — IOHEXOL 350 MG/ML SOLN
INTRAVENOUS | Status: DC | PRN
  Administered 2021-02-26: 1 mL
  Administered 2021-02-26: 15 mL

## 2021-02-26 MED ORDER — ACETAMINOPHEN 325 MG PO TABS
325.0000 mg | ORAL_TABLET | ORAL | Status: DC | PRN
  Administered 2021-02-26: 650 mg via ORAL
  Filled 2021-02-26: qty 2

## 2021-02-26 MED ORDER — PANTOPRAZOLE SODIUM 40 MG PO TBEC
40.0000 mg | DELAYED_RELEASE_TABLET | Freq: Every day | ORAL | Status: DC
  Filled 2021-02-26 (×2): qty 1

## 2021-02-26 MED ORDER — SODIUM CHLORIDE 0.9 % IV SOLN
INTRAVENOUS | Status: AC
  Filled 2021-02-26: qty 2

## 2021-02-26 MED ORDER — CEFAZOLIN SODIUM-DEXTROSE 2-4 GM/100ML-% IV SOLN
INTRAVENOUS | Status: AC
  Filled 2021-02-26: qty 100

## 2021-02-26 MED ORDER — ONDANSETRON HCL 4 MG/2ML IJ SOLN: 4.0000 mg | Freq: Four times a day (QID) | INTRAMUSCULAR | Status: DC | PRN

## 2021-02-26 MED ORDER — SODIUM CHLORIDE 0.9 % IV SOLN
80.0000 mg | INTRAVENOUS | Status: AC
  Administered 2021-02-26: 80 mg

## 2021-02-26 SURGICAL SUPPLY — 16 items
CABLE SURGICAL S-101-97-12 (CABLE) ×2 IMPLANT
CATH ACUITYPRO 45CM EH 9F (CATHETERS) ×1 IMPLANT
ICD VIGILANT DF4 G247 (ICD Generator) ×1 IMPLANT
KIT ESSENTIALS PG (KITS) ×1 IMPLANT
LEAD ACUITY X4 4671 (Lead) ×1 IMPLANT
LEAD ACUITY X4 4677 (Lead) ×1 IMPLANT
LEAD INGEVITY 7841 52 (Lead) ×1 IMPLANT
PAD DEFIB RADIO PHYSIO CONN (PAD) ×2 IMPLANT
POUCH AIGIS-R ANTIBACT ICD (Mesh General) ×2 IMPLANT
POUCH AIGIS-R ANTIBACT ICD LRG (Mesh General) IMPLANT
SHEATH 7FR PRELUDE SNAP 13 (SHEATH) ×1 IMPLANT
SHEATH 9.5FR PRELUDE SNAP 13 (SHEATH) ×1 IMPLANT
SHEATH PROBE COVER 6X72 (BAG) ×1 IMPLANT
TRAY PACEMAKER INSERTION (PACKS) ×2 IMPLANT
WIRE ACUITY WHISPER EDS 4648 (WIRE) ×1 IMPLANT
WIRE HI TORQ VERSACORE-J 145CM (WIRE) ×1 IMPLANT

## 2021-02-26 NOTE — Telephone Encounter (Signed)
I imagine your nurse can answer these questions for me: What are the [facility] rules in regards to my being at the hospital during [facility] procedure? What are the options if this procedure cannot be done? How long do you anticipate the surgery lasting?   Thank you, [name] [name]   Advised patient about waiting area guest and inpt guests policy for the hospital. Verbalized understanding. Other questions answered at drop off this morning.

## 2021-02-26 NOTE — Interval H&P Note (Signed)
History and Physical Interval Note:  02/26/2021 2:37 PM  [name] [name]  has presented today for surgery, with the diagnosis of cardiomyopathy.  The various methods of treatment have been discussed with the patient and family. After consideration of risks, benefits and other options for treatment, the patient has consented to  Procedure(s): BIV ICD UPGRADE (N/A) as a surgical intervention.  The patient's history has been reviewed, patient examined, no change in status, stable for surgery.  I have reviewed the patient's chart and labs.  Questions were answered to the patient's satisfaction.     Shyanne Mcclary T Chun Sellen

## 2021-02-26 NOTE — Progress Notes (Signed)
Patient arrived onto the unit from cath lab post ICD implantation. Patient is alert and oriented x 4, bed weight taken. Tele monitor applied and CCMD has been notified. Reconciled orders release and dinner tray ordered. Patient VS protocol initiated. Patient express 0/10 pain. Site is clean, dry and intact.

## 2021-02-26 NOTE — Discharge Instructions (Addendum)
° ° °  Supplemental Discharge Instructions for  Pacemaker/Defibrillator Patients    Activity No heavy lifting or vigorous activity with your left/right arm for 6 to 8 weeks.  Do not raise your left/right arm above your head for one week.  Gradually raise your affected arm as drawn below.             03/02/21                     03/03/21                   03/04/21                   03/05/21 __  NO DRIVING for   1 week  ; you may begin driving on  6/96/78  .  WOUND CARE Keep the wound area clean and dry.  Do not get this area wet , no showers for one week; you may shower on  03/05/21   . The tape/steri-strips on your wound will fall off; do not pull them off.  No bandage is needed on the site.  DO  NOT apply any creams, oils, or ointments to the wound area. If you notice any drainage or discharge from the wound, any swelling or bruising at the site, or you develop a fever > 101? F after you are discharged home, call the office at once.  Special Instructions You are still able to use cellular telephones; use the ear opposite the side where you have your pacemaker/defibrillator.  Avoid carrying your cellular phone near your device. When traveling through airports, show security personnel your identification card to avoid being screened in the metal detectors.  Ask the security personnel to use the hand wand. Avoid arc welding equipment, MRI testing (magnetic resonance imaging), TENS units (transcutaneous nerve stimulators).  Call the office for questions about other devices. Avoid electrical appliances that are in poor condition or are not properly grounded. Microwave ovens are safe to be near or to operate.  Additional information for defibrillator patients should your device go off: If your device goes off ONCE and you feel fine afterward, notify the device clinic nurses. If your device goes off ONCE and you do not feel well afterward, call 911. If your device goes off TWICE, call 911. If your  device goes off THREE times in one day, call 911.  DO NOT DRIVE YOURSELF OR A FAMILY MEMBER WITH A DEFIBRILLATOR TO THE HOSPITAL--CALL 911.

## 2021-02-27 ENCOUNTER — Encounter (HOSPITAL_COMMUNITY): Payer: Self-pay | Admitting: Cardiology

## 2021-02-27 ENCOUNTER — Ambulatory Visit (HOSPITAL_COMMUNITY): Payer: Medicare Other

## 2021-02-27 DIAGNOSIS — Z7984 Long term (current) use of oral hypoglycemic drugs: Secondary | ICD-10-CM | POA: Diagnosis not present

## 2021-02-27 DIAGNOSIS — I472 Ventricular tachycardia, unspecified: Secondary | ICD-10-CM | POA: Diagnosis not present

## 2021-02-27 DIAGNOSIS — I25118 Atherosclerotic heart disease of native coronary artery with other forms of angina pectoris: Secondary | ICD-10-CM | POA: Diagnosis not present

## 2021-02-27 DIAGNOSIS — Z95810 Presence of automatic (implantable) cardiac defibrillator: Secondary | ICD-10-CM

## 2021-02-27 DIAGNOSIS — Z7985 Long-term (current) use of injectable non-insulin antidiabetic drugs: Secondary | ICD-10-CM | POA: Diagnosis not present

## 2021-02-27 DIAGNOSIS — Z87891 Personal history of nicotine dependence: Secondary | ICD-10-CM | POA: Diagnosis not present

## 2021-02-27 DIAGNOSIS — I110 Hypertensive heart disease with heart failure: Secondary | ICD-10-CM | POA: Diagnosis not present

## 2021-02-27 DIAGNOSIS — I255 Ischemic cardiomyopathy: Secondary | ICD-10-CM | POA: Diagnosis not present

## 2021-02-27 DIAGNOSIS — E119 Type 2 diabetes mellitus without complications: Secondary | ICD-10-CM | POA: Diagnosis not present

## 2021-02-27 DIAGNOSIS — I5022 Chronic systolic (congestive) heart failure: Secondary | ICD-10-CM | POA: Diagnosis not present

## 2021-02-27 DIAGNOSIS — I442 Atrioventricular block, complete: Secondary | ICD-10-CM | POA: Diagnosis not present

## 2021-02-27 DIAGNOSIS — I252 Old myocardial infarction: Secondary | ICD-10-CM | POA: Diagnosis not present

## 2021-02-27 DIAGNOSIS — Z4502 Encounter for adjustment and management of automatic implantable cardiac defibrillator: Secondary | ICD-10-CM | POA: Diagnosis not present

## 2021-02-27 DIAGNOSIS — Z20822 Contact with and (suspected) exposure to covid-19: Secondary | ICD-10-CM | POA: Diagnosis not present

## 2021-02-27 MED ORDER — CARVEDILOL 3.125 MG PO TABS: 3.1250 mg | ORAL_TABLET | Freq: Two times a day (BID) | ORAL | 5 refills | Status: DC

## 2021-02-27 NOTE — Consult Note (Signed)
° °  [facility] Inpatient Consult   02/27/2021  [name] [name] Oct 31, 1940 [id number]  [facility] [[facility]] Patient: [employer]  Primary Care Provider:  [name], [name], [facility], is an embedded provider with a Chronic Care Management team and program, and is listed for the transition of care [TOC] follow up and appointments.  Patient was screened for Embedded practice service needs for chronic care management and shows to be active with the Embedded CCM program with the pharmacist.  Plan: No additional needs assessed as the TOC follow up is listed for post hospital needs with this provider office.  Please contact for further questions,  Natividad Brood, RN BSN [facility] Liaison  [phone] business mobile phone Toll free office [phone]  Fax number: [phone] [email or web address]

## 2021-02-27 NOTE — Discharge Summary (Signed)
ELECTROPHYSIOLOGY PROCEDURE DISCHARGE SUMMARY    Patient ID: [name] [name],  MRN: [id number], DOB/AGE: Jun 10, 1940 81 y.o.  Admit date: 02/26/2021 Discharge date: 02/27/2021  Primary Care Physician: [name], [name] Primary Cardiologist: Dr. Rockey Situ Electrophysiologist: Dr. Quentin Ore  Primary Discharge Diagnosis:  CHB ICM S/p ICD upgrade to CRT-D this admission  Secondary Discharge Diagnosis:  CAD VT DM HTN   Allergies  Allergen Reactions   Metformin And Related Diarrhea and Other (See Comments)    Leg cramps, also   Azithromycin Other (See Comments)    Not recommended - no reaction   Other Other (See Comments)    If taking antibiotics for awhile, thrush results   Percocet [Oxycodone-Acetaminophen] Other (See Comments)    Hallucinations   Jardiance [Empagliflozin] Other (See Comments)    Tongue itching/reaction     Procedures This Admission:  1.  Implantation of a BSCi ICD upgrade to CRT-D on 02/26/21 by Dr Quentin Ore. See procedure report for full details  There were no immediate post procedure complications. 2.  CXR on 02/27/21 demonstrated no pneumothorax status post device implantation.   Brief HPI: [name] [name] is a 81 y.o. male is followed out patient by Drs Rockey Situ and Quentin Ore with single lead ICD in place.  Noted that he had developed bradycardia/CHB and his coreg stopped.  Despite that he remained bradycardic and planned for device upgrade to CRT-D  Hospital Course:  The patient was admitted and underwent upgrade of his single lead ICD system to CRT-D.  He was monitored on telemetry overnight which demonstrated SR/V pacing.  Left chest was without hematoma or ecchymosis.  The device was interrogated and found to be functioning normally.  CXR was obtained and demonstrated no pneumothorax status post device implantation.  Wound care, arm mobility, and restrictions were reviewed with the patient.  The patient feels well, he denies any CP or SOB,  minimal site discomfort, he was examined by Dr. Quentin Ore and considered stable for discharge to home.   The patient's discharge medications include an ACE/ARB (lisinopril) and beta blocker (will resume his coreg now w/CRT pacing support).   No plavix for 5 days  Physical Exam: Vitals:   02/26/21 1930 02/26/21 2002 02/27/21 0045 02/27/21 0540  BP: (!) 149/91 140/61 133/78 110/78  Pulse: 68 60 63 74  Resp: 15 19 14 18   Temp:  98.1 F (36.7 C) 98.4 F (36.9 C) 98.9 F (37.2 C)  TempSrc:  Oral Oral Oral  SpO2: 97% 95% 92% 96%  Weight:    91.5 kg  Height:        GEN- The patient is well appearing, alert and oriented x 3 today.   HEENT: normocephalic, atraumatic; sclera clear, conjunctiva pink; hearing intact; oropharynx clear Lungs- CTA b/l, normal work of breathing.  No wheezes, rales, rhonchi Heart- RRR, no murmurs, rubs or gallops, PMI not laterally displaced GI- soft, non-tender, non-distended Extremities- no clubbing, cyanosis, or edema MS- no significant deformity or atrophy Skin- warm and dry, no rash or lesion, left chest without hematoma/ecchymosis Psych- euthymic mood, full affect Neuro- no gross defecits  Labs:   Lab Results  Component Value Date   WBC 6.4 02/21/2021   HGB 14.3 02/21/2021   HCT 42.7 02/21/2021   MCV 91.4 02/21/2021   PLT 196 02/21/2021    Recent Labs  Lab 02/21/21 1501  NA 132*  K 4.8  CL 103  CO2 22  BUN 30*  CREATININE 1.24  CALCIUM 9.2  GLUCOSE 184*  Discharge Medications:  Allergies as of 02/27/2021       Reactions   Metformin And Related Diarrhea, Other (See Comments)   Leg cramps, also   Azithromycin Other (See Comments)   Not recommended - no reaction   Other Other (See Comments)   If taking antibiotics for awhile, thrush results   Percocet [oxycodone-acetaminophen] Other (See Comments)   Hallucinations   Jardiance [empagliflozin] Other (See Comments)   Tongue itching/reaction        Medication List     TAKE  these medications    acetaminophen 500 MG tablet Commonly known as: TYLENOL Take 500 mg by mouth every 6 (six) hours as needed (pain.).   amiodarone 200 MG tablet Commonly known as: PACERONE Take 200 mg by mouth daily with lunch.   aspirin EC 81 MG tablet Take 1 tablet (81 mg total) by mouth daily. Swallow whole. What changed: when to take this   carvedilol 3.125 MG tablet Commonly known as: Coreg Take 1 tablet (3.125 mg total) by mouth 2 (two) times daily with a meal.   clopidogrel 75 MG tablet Commonly known as: PLAVIX TAKE 1 TABLET(75 MG) BY MOUTH DAILY Notes to patient: Resume on 03/04/21   docusate sodium 100 MG capsule Commonly known as: COLACE Take 100 mg by mouth in the morning, at noon, and at bedtime.   ezetimibe 10 MG tablet Commonly known as: ZETIA Take 1 tablet (10 mg total) by mouth daily. What changed: when to take this   famotidine 20 MG tablet Commonly known as: PEPCID Take 20 mg by mouth daily with lunch.   Fish Oil 1000 MG Caps Take 1,000 mg by mouth daily with lunch.   fluticasone 50 MCG/ACT nasal spray Commonly known as: FLONASE Place into both nostrils daily as needed for allergies or rhinitis.   glipiZIDE 10 MG tablet Commonly known as: GLUCOTROL Take glipizide 5mg  qam breakfast, and 10mg  qpm with supper.   lisinopril 10 MG tablet Commonly known as: ZESTRIL TAKE 1 TABLET BY MOUTH DAILY What changed: when to take this   midodrine 5 MG tablet Commonly known as: PROAMATINE Take 1 tablet (5 mg total) by mouth 2 (two) times daily as needed. Take when BP does drop less than 110 when standing. Additional dose at 2 pm if still dropping below 110 when standing   mometasone 0.1 % cream Commonly known as: ELOCON Apply to affected areas at elbow twice daily up to 5 days a week as needed. Avoid applying to face, groin, and axilla. Use as directed. Long-term use can cause thinning of the skin.   multivitamin tablet Take 1 tablet by mouth daily with  lunch.   nitroGLYCERIN 0.4 MG SL tablet Commonly known as: NITROSTAT Place 1 tablet (0.4 mg total) under the tongue every 5 (five) minutes x 3 doses as needed for chest pain.   OneTouch Verio test strip Generic drug: glucose blood USE TO CHECK BLOOD SUGAR TWICE DAILY   Ozempic (1 MG/DOSE) 4 MG/3ML Sopn Generic drug: Semaglutide (1 MG/DOSE) Inject 1 mg into the skin every Sunday.   pantoprazole 40 MG tablet Commonly known as: PROTONIX Take 1 tablet (40 mg total) by mouth daily.   psyllium 58.6 % powder Commonly known as: METAMUCIL Take 1 packet by mouth at bedtime.   rosuvastatin 10 MG tablet Commonly known as: CRESTOR TAKE 1 TABLET(10 MG) BY MOUTH DAILY What changed: See the new instructions.   sodium chloride 0.65 % Soln nasal spray Commonly known as: OCEAN Place 1  spray into both nostrils as needed for congestion.   spironolactone 25 MG tablet Commonly known as: ALDACTONE TAKE 1 TABLET(25 MG) BY MOUTH DAILY What changed: See the new instructions.   tamsulosin 0.4 MG Caps capsule Commonly known as: FLOMAX TAKE ONE CAPSULE BY MOUTH DAILY What changed:  how much to take how to take this when to take this   traMADol 50 MG tablet Commonly known as: ULTRAM TAKE 1 TABLET BY MOUTH EVERY 12 HOURS AS NEEDED What changed: when to take this        Disposition: Home Discharge Instructions     Diet - low sodium heart healthy   Complete by: As directed    Increase activity slowly   Complete by: As directed         Duration of Discharge Encounter: Greater than 30 minutes including physician time.  Venetia Night, PA-C 02/27/2021 10:28 AM

## 2021-02-27 NOTE — Plan of Care (Signed)

## 2021-02-27 NOTE — Progress Notes (Signed)
Patient given discharge instructions and stated understanding. 

## 2021-02-27 NOTE — TOC Transition Note (Signed)
Transition of Care [facility]) - CM/SW Discharge Note   Patient Details  Name: [name] [name] MRN: [id number] Date of Birth: 06/28/1940  [facility] CM/SW Contact:  Zenon Mayo, RN Phone Number: 02/27/2021, 10:36 AM   Clinical Narrative:    Patient for dc today, he has no need, follow up with PCP on AVS.   Final next level of care: Home/Self Care Barriers to Discharge: No Barriers Identified   Patient Goals and CMS Choice        Discharge Placement                       Discharge Plan and Services                                     Social Determinants of Health (SDOH) Interventions     Readmission Risk Interventions No flowsheet data found.

## 2021-02-28 ENCOUNTER — Encounter (HOSPITAL_COMMUNITY): Payer: Self-pay | Admitting: Cardiology

## 2021-03-01 ENCOUNTER — Telehealth: Payer: Self-pay | Admitting: Pharmacy Technician

## 2021-03-01 DIAGNOSIS — Z596 Low income: Secondary | ICD-10-CM

## 2021-03-01 NOTE — Progress Notes (Signed)
[facility])                                            [place] Team    03/01/2021  Amilcar Reever 03/07/1940 [id number]  Care coordination call placed to [place] in regard to [place] application.  Spoke to [place] who informs patient is APPROVED 02/27/21-01/13/22. She informs medication will ship based on last fill date in 2022 and going forward in 2023 with delivery to the provider's office. Delivery can take up to 42 days once processed.  Thirza Pellicano P. Drury Ardizzone, [place]  [phone]

## 2021-03-02 ENCOUNTER — Other Ambulatory Visit: Payer: Self-pay | Admitting: Cardiovascular Disease

## 2021-03-07 ENCOUNTER — Telehealth: Payer: Self-pay | Admitting: Urology

## 2021-03-07 NOTE — Telephone Encounter (Signed)
Pt called office asking about CT scan that is scheduled for 03/16/21.  His follow up w/[facility] isn't until 08/30/21.  He wanted to know if it was necessary to have CT scan this early, or if it could be put off til closer to appt.  He said if we could send him a [facility] message to let him know, that would be great.

## 2021-03-08 ENCOUNTER — Ambulatory Visit (INDEPENDENT_AMBULATORY_CARE_PROVIDER_SITE_OTHER): Payer: Medicare Other

## 2021-03-08 ENCOUNTER — Other Ambulatory Visit: Payer: Self-pay

## 2021-03-08 DIAGNOSIS — I5022 Chronic systolic (congestive) heart failure: Secondary | ICD-10-CM

## 2021-03-08 NOTE — Telephone Encounter (Signed)
See my chart message

## 2021-03-08 NOTE — Telephone Encounter (Signed)
Nels I have an 11:30 on 03/09/21 I scheduled for you so let me know if this works.

## 2021-03-08 NOTE — Patient Instructions (Signed)

## 2021-03-09 ENCOUNTER — Other Ambulatory Visit (INDEPENDENT_AMBULATORY_CARE_PROVIDER_SITE_OTHER): Payer: Medicare Other

## 2021-03-09 ENCOUNTER — Other Ambulatory Visit: Payer: Self-pay

## 2021-03-09 DIAGNOSIS — R899 Unspecified abnormal finding in specimens from other organs, systems and tissues: Secondary | ICD-10-CM | POA: Diagnosis not present

## 2021-03-09 LAB — CBC WITH DIFFERENTIAL/PLATELET
Basophils Absolute: 0 10*3/uL (ref 0.0–0.1)
Basophils Relative: 0.6 % (ref 0.0–3.0)
Eosinophils Absolute: 0.2 10*3/uL (ref 0.0–0.7)
Eosinophils Relative: 4.3 % (ref 0.0–5.0)
HCT: 41.2 % (ref 39.0–52.0)
Hemoglobin: 13.9 g/dL (ref 13.0–17.0)
Lymphocytes Relative: 27.4 % (ref 12.0–46.0)
Lymphs Abs: 1.6 10*3/uL (ref 0.7–4.0)
MCHC: 33.7 g/dL (ref 30.0–36.0)
MCV: 90.6 fl (ref 78.0–100.0)
Monocytes Absolute: 0.6 10*3/uL (ref 0.1–1.0)
Monocytes Relative: 10.2 % (ref 3.0–12.0)
Neutro Abs: 3.3 10*3/uL (ref 1.4–7.7)
Neutrophils Relative %: 57.5 % (ref 43.0–77.0)
Platelets: 206 10*3/uL (ref 150.0–400.0)
RBC: 4.55 Mil/uL (ref 4.22–5.81)
RDW: 15 % (ref 11.5–15.5)
WBC: 5.7 10*3/uL (ref 4.0–10.5)

## 2021-03-09 LAB — HEPATIC FUNCTION PANEL
ALT: 54 U/L — ABNORMAL HIGH (ref 0–53)
AST: 62 U/L — ABNORMAL HIGH (ref 0–37)
Albumin: 3.8 g/dL (ref 3.5–5.2)
Alkaline Phosphatase: 139 U/L — ABNORMAL HIGH (ref 39–117)
Bilirubin, Direct: 0.2 mg/dL (ref 0.0–0.3)
Total Bilirubin: 1 mg/dL (ref 0.2–1.2)
Total Protein: 6.7 g/dL (ref 6.0–8.3)

## 2021-03-09 LAB — GAMMA GT: GGT: 182 U/L — ABNORMAL HIGH (ref 7–51)

## 2021-03-10 ENCOUNTER — Other Ambulatory Visit: Payer: Self-pay | Admitting: Family

## 2021-03-10 DIAGNOSIS — G8929 Other chronic pain: Secondary | ICD-10-CM

## 2021-03-12 ENCOUNTER — Encounter: Payer: Self-pay | Admitting: Family

## 2021-03-12 ENCOUNTER — Ambulatory Visit (INDEPENDENT_AMBULATORY_CARE_PROVIDER_SITE_OTHER): Payer: Medicare Other | Admitting: Family

## 2021-03-12 ENCOUNTER — Other Ambulatory Visit: Payer: Self-pay

## 2021-03-12 VITALS — BP 130/68 | HR 64 | Temp 97.9°F | Ht 70.0 in | Wt 209.2 lb

## 2021-03-12 DIAGNOSIS — M545 Low back pain, unspecified: Secondary | ICD-10-CM | POA: Diagnosis not present

## 2021-03-12 DIAGNOSIS — I10 Essential (primary) hypertension: Secondary | ICD-10-CM | POA: Diagnosis not present

## 2021-03-12 DIAGNOSIS — G8929 Other chronic pain: Secondary | ICD-10-CM | POA: Diagnosis not present

## 2021-03-12 DIAGNOSIS — R748 Abnormal levels of other serum enzymes: Secondary | ICD-10-CM | POA: Insufficient documentation

## 2021-03-12 MED ORDER — GABAPENTIN 100 MG PO CAPS: 100.0000 mg | ORAL_CAPSULE | Freq: Three times a day (TID) | ORAL | 3 refills | Status: DC

## 2021-03-12 NOTE — Progress Notes (Signed)
Subjective:    Patient ID: [name] [name], male    DOB: Jun 11, 1940, 81 y.o.   MRN: [id number]  CC: [name] [name] is a 81 y.o. male who presents today for follow up.   HPI: Accompanied by wife today  Here for follow-up today Elevated liver enzymes Low back pain has worsened x 2 weeks, unchanged.  He has been taking tylenol extra strength 3-4 times per day for low back pain. Back pain increased after ICD replaced while in the hospital. He was lying in the hospital bed at that time. Describes as  'Flared up'. Exercise at home has been helpful. Tramadol 50mg  bid is not very helpful. Sleep interrupted by low back pain. Pain radiates to legs.  No leg pain today.  Xray  lumbar showed degenerative grade spondylolisthesis No leg numbness, trouble urinating, groin pain, saddle anesthesia.  History of numbness in his feet for many years from diabetes.   History of cholecystectomy  CT abdomen pelvis without contrast scheduled 03/16/2021 HLD-compliant with Zetia 10 mg, rosuvastatin 10 mg  HTN- compliant with Coreg 3.125mg  bid, lisinopril 10mg , spironolactone 25mg . No cp, sob, palpitations  He has been following closely with Dr. Quentin Ore and had ICD upgrade 02/26/21.  Follow-up scheduled 06/06/2021 He was seen 02/21/2021 for follow-up Dr. Quentin Ore for chronic systolic heart failure, VT, atherosclerosis.  Complete heart block which was new.  He remains compliant with Plavix, aspirin  Previously seen by Dr. [name] for history of diarrhea, history of colon polyps.  Last seen 08/15/2020  HISTORY:  Past Medical History:  Diagnosis Date   Aortic insufficiency    a. noted on TTE 2015; b. 06/2019 Echo: AI not visualized.   Arthritis    CAD (coronary artery disease)    a. remote PCI in 1991 and 2005; b. MV 3/15: old inferior MI, no ischemia, LVEF 50%, slight inferior wall hypokniesis; c. 06/2019 NSTEMI/PCI: LM nl, LAD 80p/m (Atherectomy & 4.5x18 Resolute Onyx DES), 67m/d, D1 75 (PTCA), RI patent stent, LCX  nl, RCA 100p, RPAV fills via L->R collats from LCX.   Chicken pox    Colon polyps    4 pre-cancerous    Diverticulitis    DM type 2 (diabetes mellitus, type 2) (HCC)    Family history of adverse reaction to anesthesia    GERD (gastroesophageal reflux disease)    Heart murmur    History of kidney stones    HOH (hard of hearing)    Hyperlipidemia    Hypertension    Ischemic cardiomyopathy    a. TTE 2015: EF  50-55%, mild global HK; b. 06/2019 Echo: EF 45-50%, Gr1 DD, basal inf AK. Triv MR.   Kidney stones    Melanoma ([place]) 1980   Resected from his back   Mitral regurgitation    a. noted on TTE 2015   Myocardial infarction [facility])    Past Surgical History:  Procedure Laterality Date   BIV UPGRADE N/A 02/26/2021   Procedure: BIV ICD UPGRADE;  Surgeon: Vickie Epley, MD;  Location: [place] CV LAB;  Service: Cardiovascular;  Laterality: N/A;   CHOLECYSTECTOMY  2012   COLONOSCOPY     in 2003 with polyp removed and leak anastomosis had to have open abdominal surgery    [place] & 2005   CORONARY ATHERECTOMY N/A 07/08/2019   Procedure: CORONARY ATHERECTOMY;  Surgeon: Jettie Booze, MD;  Location: [place] CV [facility];  Service: Cardiovascular;  Laterality: N/A;   CORONARY BALLOON ANGIOPLASTY N/A  07/08/2019   Procedure: CORONARY BALLOON ANGIOPLASTY;  Surgeon: Jettie Booze, MD;  Location: [place] CV LAB;  Service: Cardiovascular;  Laterality: N/A;  diagonal    CORONARY STENT INTERVENTION N/A 07/08/2019   Procedure: CORONARY STENT INTERVENTION;  Surgeon: Jettie Booze, MD;  Location: [place] CV LAB;  Service: Cardiovascular;  Laterality: N/A;  lad   CYSTOSCOPY/URETEROSCOPY/HOLMIUM LASER/STENT PLACEMENT Right 01/02/2018   Procedure: CYSTOSCOPY/URETEROSCOPY/HOLMIUM LASER/STENT PLACEMENT;  Surgeon: [name], MD;  Location: [facility] OR[facility];  Service: Urology;  Laterality: Right;   ESOPHAGOGASTRODUODENOSCOPY (EGD) WITH  PROPOFOL N/A 11/19/2016   Procedure: ESOPHAGOGASTRODUODENOSCOPY (EGD) WITH PROPOFOL;  Surgeon: [name], MD;  Location: [facility];  Service: Endoscopy;  Laterality: N/A;   ESOPHAGOGASTRODUODENOSCOPY (EGD) WITH PROPOFOL N/A 02/02/2019   Procedure: ESOPHAGOGASTRODUODENOSCOPY (EGD) WITH PROPOFOL;  Surgeon: [name], MD;  Location: [facility] END[facility]COPY;  Service: Endoscopy;  Laterality: N/A;   ICD IMPLANT N/A 02/02/2020   Procedure: ICD IMPLANT;  Surgeon: Vickie Epley, MD;  Location: [place] [facility];  Service: Cardiovascular;  Laterality: N/A;   INTRAVASCULAR PRESSURE WIRE/FFR STUDY N/A 07/07/2019   Procedure: INTRAVASCULAR PRESSURE WIRE/FFR STUDY;  Surgeon: Nelva Bush, MD;  Location: [place] [facility];  Service: Cardiovascular;  Laterality: N/A;   INTRAVASCULAR ULTRASOUND/IVUS N/A 07/08/2019   Procedure: Intravascular Ultrasound/IVUS;  Surgeon: Jettie Booze, MD;  Location: [place] [facility];  Service: Cardiovascular;  Laterality: N/A;   LEAD REVISION/REPAIR N/A 02/16/2020   Procedure: LEAD REVISION/REPAIR;  Surgeon: Constance Haw, MD;  Location: [place] [facility];  Service: Cardiovascular;  Laterality: N/A;   LEFT HEART CATH N/A 07/08/2019   Procedure: Left Heart Cath;  Surgeon: Jettie Booze, MD;  Location: [place] [facility];  Service: Cardiovascular;  Laterality: N/A;   LEFT HEART CATH AND CORONARY ANGIOGRAPHY N/A 07/07/2019   Procedure: LEFT HEART CATH AND CORONARY ANGIOGRAPHY;  Surgeon: Nelva Bush, MD;  Location: [place] [facility];  Service: Cardiovascular;  Laterality: N/A;   LITHOTRIPSY  2015   MELANOMA EXCISION  1980   malignant   NERVE SURGERY  2015   ulna nerve   TONSILLECTOMY  1945   WISDOM TOOTH EXTRACTION     Family History  Problem Relation Age of Onset   Hyperlipidemia Mother    Hypertension Mother    Heart disease Mother    Diabetes Mother    Heart attack Mother    Colon cancer Father        metasized to liver, adrenal,  lungs   Lung cancer Father    Kidney cancer Father        malignant capsulated kidney tumor   Diabetes Father    Liver cancer Father    Bladder Cancer Neg Hx    Prostate cancer Neg Hx     Allergies: Metformin and related, Azithromycin, Other, Percocet [oxycodone-acetaminophen], and Jardiance [empagliflozin] Current Outpatient Medications on File Prior to Visit  Medication Sig Dispense Refill   acetaminophen (TYLENOL) 500 MG tablet Take 500 mg by mouth every 6 (six) hours as needed (pain.).     amiodarone (PACERONE) 200 MG tablet Take 200 mg by mouth daily with lunch.     aspirin EC 81 MG tablet Take 1 tablet (81 mg total) by mouth daily. Swallow whole. (Patient taking differently: Take 81 mg by mouth daily with lunch. Swallow whole.) 90 tablet 3   carvedilol (COREG) 3.125 MG tablet Take 1 tablet (3.125 mg total) by mouth 2 (two) times daily with a meal. 60 tablet 5  clopidogrel (PLAVIX) 75 MG tablet TAKE 1 TABLET(75 MG) BY MOUTH DAILY 30 tablet 5   ezetimibe (ZETIA) 10 MG tablet TAKE 1 TABLET(10 MG) BY MOUTH DAILY 90 tablet 2   famotidine (PEPCID) 20 MG tablet Take 20 mg by mouth daily with lunch.     fluticasone (FLONASE) 50 MCG/ACT nasal spray Place into both nostrils daily as needed for allergies or rhinitis.     glipiZIDE (GLUCOTROL) 10 MG tablet Take glipizide 5mg  qam breakfast, and 10mg  qpm with supper. 120 tablet 3   lisinopril (ZESTRIL) 10 MG tablet TAKE 1 TABLET BY MOUTH DAILY (Patient taking differently: Take 10 mg by mouth daily with supper.) 90 tablet 1   midodrine (PROAMATINE) 5 MG tablet Take 1 tablet (5 mg total) by mouth 2 (two) times daily as needed. Take when BP does drop less than 110 when standing. Additional dose at 2 pm if still dropping below 110 when standing 90 tablet 3   mometasone (ELOCON) 0.1 % cream Apply to affected areas at elbow twice daily up to 5 days a week as needed. Avoid applying to face, groin, and axilla. Use as directed. Long-term use can cause  thinning of the skin. 45 g 2   Multiple Vitamin (MULTIVITAMIN) tablet Take 1 tablet by mouth daily with lunch.     nitroGLYCERIN (NITROSTAT) 0.4 MG SL tablet Place 1 tablet (0.4 mg total) under the tongue every 5 (five) minutes x 3 doses as needed for chest pain. 25 tablet 0   Omega-3 Fatty Acids (FISH OIL) 1000 MG CAPS Take 1,000 mg by mouth daily with lunch.     ONETOUCH VERIO test strip USE TO CHECK BLOOD SUGAR TWICE DAILY 100 strip 1   pantoprazole (PROTONIX) 40 MG tablet Take 1 tablet (40 mg total) by mouth daily. 30 tablet 3   psyllium (METAMUCIL) 58.6 % powder Take 1 packet by mouth at bedtime.     rosuvastatin (CRESTOR) 10 MG tablet TAKE 1 TABLET(10 MG) BY MOUTH DAILY (Patient taking differently: Take 10 mg by mouth daily with supper.) 90 tablet 0   Semaglutide, 1 MG/DOSE, (OZEMPIC, 1 MG/DOSE,) 4 MG/3ML SOPN Inject 1 mg into the skin every Sunday.     sodium chloride (OCEAN) 0.65 % SOLN nasal spray Place 1 spray into both nostrils as needed for congestion.     spironolactone (ALDACTONE) 25 MG tablet TAKE 1 TABLET(25 MG) BY MOUTH DAILY 90 tablet 2   tamsulosin (FLOMAX) 0.4 MG CAPS capsule TAKE ONE CAPSULE BY MOUTH DAILY (Patient taking differently: Take 0.4 mg by mouth daily with lunch. TAKE ONE CAPSULE BY MOUTH DAILY) 90 capsule 3   traMADol (ULTRAM) 50 MG tablet TAKE 1 TABLET BY MOUTH EVERY 12 HOURS AS NEEDED 60 tablet 2   docusate sodium (COLACE) 100 MG capsule Take 100 mg by mouth in the morning, at noon, and at bedtime. (Patient not taking: Reported on 03/12/2021)     No current facility-administered medications on file prior to visit.    Social History   Tobacco Use   Smoking status: Some Days    Types: Pipe   Smokeless tobacco: Former    Quit date: 10/17/2015  Vaping Use   Vaping Use: Never used  Substance Use Topics   Alcohol use: Not Currently    Alcohol/week: 0.0 - 1.0 standard drinks   Drug use: No    Review of Systems  Constitutional:  Negative for chills and fever.   Respiratory:  Negative for cough.   Cardiovascular:  Negative for  chest pain and palpitations.  Gastrointestinal:  Negative for nausea and vomiting.  Musculoskeletal:  Positive for back pain.  Neurological:  Positive for numbness (peripheral feet).     Objective:    BP 130/68 (BP Location: Left Arm, Patient Position: Sitting, Cuff Size: Normal)    Pulse 64    Temp 97.9 F (36.6 C) (Oral)    Ht 5\' 10"  (1.778 m)    Wt 209 lb 3.2 oz (94.9 kg)    SpO2 97%    BMI 30.02 kg/m  BP Readings from Last 3 Encounters:  03/12/21 130/68  02/27/21 110/78  02/21/21 (!) 144/70   Wt Readings from Last 3 Encounters:  03/12/21 209 lb 3.2 oz (94.9 kg)  02/27/21 201 lb 11.2 oz (91.5 kg)  02/21/21 206 lb (93.4 kg)    Physical Exam Vitals reviewed.  Constitutional:      Appearance: He is well-developed.  Cardiovascular:     Rate and Rhythm: Regular rhythm.     Heart sounds: Normal heart sounds.  Pulmonary:     Effort: Pulmonary effort is normal. No respiratory distress.     Breath sounds: Normal breath sounds. No wheezing or rales.  Musculoskeletal:     Lumbar back: Tenderness present. No swelling or spasms. Normal range of motion.     Comments: Full range of motion with flexion, extension, lateral side bends.Pain elicited with bilateral SLR. No numbness, tingling elicited with single leg raise bilaterally. No rash.  Skin:    General: Skin is warm and dry.  Neurological:     Mental Status: He is alert.  Psychiatric:        Speech: Speech normal.        Behavior: Behavior normal.       Assessment & Plan:   Problem List Items Addressed This Visit       Cardiovascular and Mediastinum   Essential hypertension    Excellent control.  Continue Coreg 3.125mg  bid, lisinopril 10mg , spironolactone 25mg          Other   Chronic low back pain - Primary    Chronic, poor control at this time. Known h/o degenerative changes from prior imaging. Continue tramadol 50mg  bid. Start gabapentin 100mg  qhs  and titrate. Consider prednisone or lyrica in the future. Referral to PT. consider CT lumbar (cannot have MRI due to pacemaker) at follow-up.      Relevant Medications   gabapentin (NEURONTIN) 100 MG capsule   Other Relevant Orders   Ambulatory referral to Physical Therapy   Elevated liver enzymes    Discussed acetaminophen 500 mg 3-4 times per day as likely culprit.  We will recheck liver enzymes in a couple weeks.  If remains elevated, we will get patient back to gastroenterology, Dr. [name]  for further investigation.  May consider holding rosuvastatin as well.  Advised to stop acetaminophen from all sources .will follow.       Relevant Orders   Lipid panel   Hepatic function panel   Gamma GT     I am having [name] [address]" start on gabapentin. I am also having him maintain his psyllium, sodium chloride, nitroGLYCERIN, acetaminophen, fluticasone, aspirin EC, midodrine, famotidine, amiodarone, multivitamin, Fish Oil, glipiZIDE, tamsulosin, lisinopril, mometasone, Ozempic (1 MG/DOSE), rosuvastatin, clopidogrel, pantoprazole, OneTouch Verio, docusate sodium, carvedilol, spironolactone, ezetimibe, and traMADol.   Meds ordered this encounter  Medications   gabapentin (NEURONTIN) 100 MG capsule    Sig: Take 1 capsule (100 mg total) by mouth 3 (three) times daily.  Dispense:  90 capsule    Refill:  3    Order Specific Question:   Supervising Provider    Answer:   Crecencio Mc [[id number]]    Return precautions given.   Risks, benefits, and alternatives of the medications and treatment plan prescribed today were discussed, and patient expressed understanding.   Education regarding symptom management and diagnosis given to patient on AVS.  Continue to follow with [name], [name] for routine health maintenance.   [name] and I agreed with plan.   [name], [name]

## 2021-03-12 NOTE — Assessment & Plan Note (Signed)
Excellent control.  Continue Coreg 3.125mg  bid, lisinopril 10mg , spironolactone 25mg 

## 2021-03-12 NOTE — Assessment & Plan Note (Signed)
Chronic, poor control at this time. Known h/o degenerative changes from prior imaging. Continue tramadol 50mg  bid. Start gabapentin 100mg  qhs and titrate. Consider prednisone or lyrica in the future. Referral to PT. consider CT lumbar (cannot have MRI due to pacemaker) at follow-up.

## 2021-03-12 NOTE — Patient Instructions (Addendum)
As discussed, please start gabapentin 100 mg at bedtime.  You may slowly increase this over the next few days to 1 tablet at night and then in the morning.  After a few more days you may then increase to 1 tablet 3 times a day.  This is just a starting point.  We may consider prednisone in the future.  Please stop Tylenol from all sources as I think this is raising liver enzymes  Referral to physical therapy Let us know if you dont hear back within a week in regards to an appointment being scheduled.

## 2021-03-12 NOTE — Assessment & Plan Note (Signed)
Discussed acetaminophen 500 mg 3-4 times per day as likely culprit.  We will recheck liver enzymes in a couple weeks.  If remains elevated, we will get patient back to gastroenterology, Dr. [name]  for further investigation.  May consider holding rosuvastatin as well.  Advised to stop acetaminophen from all sources .will follow.

## 2021-03-13 ENCOUNTER — Encounter: Payer: Self-pay | Admitting: Family

## 2021-03-13 LAB — ALKALINE PHOSPHATASE, ISOENZYMES
Alkaline Phosphatase: 171 IU/L — ABNORMAL HIGH (ref 44–121)
BONE FRACTION: 26 % (ref 12–68)
INTESTINAL FRAC.: 3 % (ref 0–18)
LIVER FRACTION: 71 % (ref 13–88)

## 2021-03-15 DIAGNOSIS — M5459 Other low back pain: Secondary | ICD-10-CM | POA: Diagnosis not present

## 2021-03-15 DIAGNOSIS — H2513 Age-related nuclear cataract, bilateral: Secondary | ICD-10-CM | POA: Diagnosis not present

## 2021-03-16 ENCOUNTER — Other Ambulatory Visit: Payer: Self-pay

## 2021-03-16 ENCOUNTER — Ambulatory Visit
Admission: RE | Admit: 2021-03-16 | Discharge: 2021-03-16 | Disposition: A | Discharge status: Home / Self Care | Payer: Medicare Other | Source: Ambulatory Visit | Attending: Urology | Admitting: Urology

## 2021-03-16 DIAGNOSIS — N200 Calculus of kidney: Secondary | ICD-10-CM | POA: Diagnosis not present

## 2021-03-16 DIAGNOSIS — D3501 Benign neoplasm of right adrenal gland: Secondary | ICD-10-CM | POA: Insufficient documentation

## 2021-03-16 DIAGNOSIS — E278 Other specified disorders of adrenal gland: Secondary | ICD-10-CM | POA: Diagnosis not present

## 2021-03-16 DIAGNOSIS — K5730 Diverticulosis of large intestine without perforation or abscess without bleeding: Secondary | ICD-10-CM | POA: Diagnosis not present

## 2021-03-16 DIAGNOSIS — N289 Disorder of kidney and ureter, unspecified: Secondary | ICD-10-CM | POA: Diagnosis not present

## 2021-03-19 DIAGNOSIS — M5459 Other low back pain: Secondary | ICD-10-CM | POA: Diagnosis not present

## 2021-03-19 NOTE — Progress Notes (Signed)

## 2021-03-20 ENCOUNTER — Ambulatory Visit: Payer: Self-pay | Admitting: Pharmacist

## 2021-03-20 NOTE — Chronic Care Management (AMB) (Signed)
?  Chronic Care Management  ? ?Note ? ?03/20/2021 ?Name: [name] [name] MRN: [id number] DOB: [birth year] ? ? ? ?Closing pharmacy CCM case at this time. Will collaborate with Care Guide to outreach to schedule follow up with RN CM. Patient has clinic contact information for future questions or concerns.  ? ?[name], PharmD, [place], CPP ?Clinical Pharmacist ?Therapist, music at [employer] ?[phone] ? ?

## 2021-03-22 DIAGNOSIS — M5459 Other low back pain: Secondary | ICD-10-CM | POA: Diagnosis not present

## 2021-03-23 ENCOUNTER — Encounter: Payer: Self-pay | Admitting: Family

## 2021-03-26 DIAGNOSIS — M5459 Other low back pain: Secondary | ICD-10-CM | POA: Diagnosis not present

## 2021-03-28 ENCOUNTER — Encounter: Payer: Self-pay | Admitting: Family

## 2021-03-28 DIAGNOSIS — M5459 Other low back pain: Secondary | ICD-10-CM | POA: Diagnosis not present

## 2021-03-28 NOTE — Telephone Encounter (Signed)
Pt called in needing a refill on ozempic  ?

## 2021-03-29 MED ORDER — OZEMPIC (1 MG/DOSE) 4 MG/3ML ~~LOC~~ SOPN: 1.0000 mg | PEN_INJECTOR | SUBCUTANEOUS | 0 refills | Status: DC

## 2021-03-29 NOTE — Telephone Encounter (Signed)
Called patient to discuss medication assistance after reading letter patient does not need a script or patient assistance paperwork we are waiting on [employer] to ship medication to office I have called in one Ozempic pen for patient to his pharmacy to last until we receive his medication. CMA calling to verify shipment. ?

## 2021-03-29 NOTE — Telephone Encounter (Signed)
[employer] this morning where I was informed that they had already shipped his medication but they were having shipping delays that were taking 6-8 weeks versus the usual 10-14 days. They stated that by the end of March that they should have shipping back to the normal allotted timeframe. I then informed [name] [name] of the situation and  let him know that we had sent an Ozempic pen to the pharmacy to help cover until he gets his shipment. I informed him that if he ran out before he got his that he could come by the office to get a sample or we would send another to pharmacy to cover until he gets his. Per [name].  ?

## 2021-03-30 ENCOUNTER — Encounter: Payer: Self-pay | Admitting: Family

## 2021-04-02 ENCOUNTER — Telehealth: Payer: Self-pay

## 2021-04-02 DIAGNOSIS — M5459 Other low back pain: Secondary | ICD-10-CM | POA: Diagnosis not present

## 2021-04-02 NOTE — Telephone Encounter (Signed)
Called patient to inform him that his Ozempic was ready for piclup her at the office from Patient assistance. ?

## 2021-04-02 NOTE — Telephone Encounter (Signed)
Patient came into the office today to pick up his Ozempic from Patient assistance. ?

## 2021-04-05 ENCOUNTER — Telehealth: Payer: Self-pay

## 2021-04-05 DIAGNOSIS — M5459 Other low back pain: Secondary | ICD-10-CM | POA: Diagnosis not present

## 2021-04-05 NOTE — Chronic Care Management (AMB) (Signed)
?  Chronic Care Management  ? ?Note ? ?04/05/2021 ?Name: [name] [name] MRN: [id number] DOB: Dec 19, 1940 ? ?[name] [name] is a 81 y.o. year old male who is a primary care patient of [name], [name]. [name] [name] is currently enrolled in care management services. An additional referral for RN CM  was placed.  ? ?Follow up plan: ?Patient declines further follow up and engagement by the care management team. Appropriate care team members and provider have been notified via electronic communication.  ? ?Noreene Larsson, RMA ?Care Guide, Embedded Care Coordination ?[facility]  ?[place], [place] [address] ?Direct Dial: [phone] ?[email or web address] ?[email or web address]: [email or web address]  ? ?

## 2021-04-06 ENCOUNTER — Other Ambulatory Visit: Payer: Self-pay | Admitting: Family

## 2021-04-06 ENCOUNTER — Encounter: Payer: Self-pay | Admitting: Family

## 2021-04-06 ENCOUNTER — Other Ambulatory Visit (INDEPENDENT_AMBULATORY_CARE_PROVIDER_SITE_OTHER): Payer: Medicare Other

## 2021-04-06 ENCOUNTER — Other Ambulatory Visit: Payer: Self-pay

## 2021-04-06 DIAGNOSIS — R748 Abnormal levels of other serum enzymes: Secondary | ICD-10-CM | POA: Diagnosis not present

## 2021-04-06 LAB — HEPATIC FUNCTION PANEL
ALT: 48 U/L (ref 0–53)
AST: 55 U/L — ABNORMAL HIGH (ref 0–37)
Albumin: 3.9 g/dL (ref 3.5–5.2)
Alkaline Phosphatase: 117 U/L (ref 39–117)
Bilirubin, Direct: 0.3 mg/dL (ref 0.0–0.3)
Total Bilirubin: 1.2 mg/dL (ref 0.2–1.2)
Total Protein: 6.6 g/dL (ref 6.0–8.3)

## 2021-04-06 LAB — LDL CHOLESTEROL, DIRECT: Direct LDL: 59 mg/dL

## 2021-04-06 LAB — LIPID PANEL
Cholesterol: 122 mg/dL (ref 0–200)
HDL: 35.6 mg/dL — ABNORMAL LOW (ref >39.00)
NonHDL: 86.37
Total CHOL/HDL Ratio: 3
Triglycerides: 226 mg/dL — ABNORMAL HIGH (ref 0.0–149.0)
VLDL: 45.2 mg/dL — ABNORMAL HIGH (ref 0.0–40.0)

## 2021-04-06 LAB — GAMMA GT: GGT: 150 U/L — ABNORMAL HIGH (ref 7–51)

## 2021-04-10 ENCOUNTER — Telehealth: Payer: Medicare Other

## 2021-04-25 ENCOUNTER — Ambulatory Visit: Payer: Medicare Other | Admitting: Family

## 2021-04-26 ENCOUNTER — Other Ambulatory Visit: Payer: Self-pay | Admitting: Cardiology

## 2021-04-26 NOTE — Telephone Encounter (Signed)
Please advise if ok to refill Historical Medication Amiodarone 200 mg qd.  ?Pt taking once a day. ? ?

## 2021-04-27 ENCOUNTER — Other Ambulatory Visit: Payer: Self-pay | Admitting: Cardiovascular Disease

## 2021-04-27 ENCOUNTER — Other Ambulatory Visit: Payer: Self-pay | Admitting: Cardiology

## 2021-05-01 NOTE — Telephone Encounter (Signed)
Pt sent mychart message to see if lab orders were place... Schedule pt on 05/18/2021 at 9:15am...  ?

## 2021-05-02 ENCOUNTER — Encounter: Payer: Medicare Other | Admitting: Cardiology

## 2021-05-11 ENCOUNTER — Other Ambulatory Visit: Payer: Self-pay | Admitting: Family

## 2021-05-11 ENCOUNTER — Other Ambulatory Visit: Payer: Self-pay | Admitting: Cardiovascular Disease

## 2021-05-11 DIAGNOSIS — I10 Essential (primary) hypertension: Secondary | ICD-10-CM

## 2021-05-18 ENCOUNTER — Other Ambulatory Visit (INDEPENDENT_AMBULATORY_CARE_PROVIDER_SITE_OTHER): Payer: Medicare Other

## 2021-05-18 DIAGNOSIS — R748 Abnormal levels of other serum enzymes: Secondary | ICD-10-CM | POA: Diagnosis not present

## 2021-05-18 LAB — HEPATIC FUNCTION PANEL
ALT: 49 U/L (ref 0–53)
AST: 53 U/L — ABNORMAL HIGH (ref 0–37)
Albumin: 3.8 g/dL (ref 3.5–5.2)
Alkaline Phosphatase: 90 U/L (ref 39–117)
Bilirubin, Direct: 0.3 mg/dL (ref 0.0–0.3)
Total Bilirubin: 1.4 mg/dL — ABNORMAL HIGH (ref 0.2–1.2)
Total Protein: 7.1 g/dL (ref 6.0–8.3)

## 2021-05-18 LAB — GAMMA GT: GGT: 125 U/L — ABNORMAL HIGH (ref 7–51)

## 2021-05-20 ENCOUNTER — Encounter: Payer: Self-pay | Admitting: Family

## 2021-05-21 ENCOUNTER — Other Ambulatory Visit: Payer: Self-pay

## 2021-05-21 ENCOUNTER — Encounter: Payer: Self-pay | Admitting: Podiatry

## 2021-05-21 ENCOUNTER — Ambulatory Visit: Payer: Medicare Other | Admitting: Podiatry

## 2021-05-21 DIAGNOSIS — M79675 Pain in left toe(s): Secondary | ICD-10-CM

## 2021-05-21 DIAGNOSIS — B351 Tinea unguium: Secondary | ICD-10-CM | POA: Diagnosis not present

## 2021-05-21 DIAGNOSIS — M79674 Pain in right toe(s): Secondary | ICD-10-CM

## 2021-05-21 DIAGNOSIS — E119 Type 2 diabetes mellitus without complications: Secondary | ICD-10-CM

## 2021-05-21 DIAGNOSIS — R748 Abnormal levels of other serum enzymes: Secondary | ICD-10-CM

## 2021-05-21 NOTE — Progress Notes (Signed)
This patient returns to my office for at risk foot care.  This patient requires this care by a professional since this patient will be at risk due to having diabetes,He had seen Dr.  McDonald for previous nail care.  This patient is unable to cut nails himself since the patient cannot reach his nails.These nails are painful walking and wearing shoes.  This patient presents for at risk foot care today.  General Appearance  Alert, conversant and in no acute stress.  Vascular  Dorsalis pedis and posterior tibial  pulses are palpable  bilaterally.  Capillary return is within normal limits  bilaterally. Temperature is within normal limits  bilaterally.  Neurologic  Senn-Weinstein monofilament wire test diminished  bilaterally. Muscle power within normal limits bilaterally.  Nails Thick disfigured discolored nails with subungual debris  from hallux to fifth toes bilaterally. No evidence of bacterial infection or drainage bilaterally. Marked curvature medial border right hallux.  Orthopedic  No limitations of motion  feet .  No crepitus or effusions noted.  No bony pathology or digital deformities noted. HAV  B/L  Skin  normotropic skin with no porokeratosis noted bilaterally.  No signs of infections or ulcers noted.     Onychomycosis  Pain in right toes  Pain in left toes  Consent was obtained for treatment procedures.   Mechanical debridement of nails 1-5  bilaterally performed with a nail nipper.  Filed with dremel without incident.    Return office visit    3 months                  Told patient to return for periodic foot care and evaluation due to potential at risk complications.   Caitlinn Klinker DPM   

## 2021-05-23 ENCOUNTER — Encounter: Payer: Self-pay | Admitting: Family

## 2021-05-23 ENCOUNTER — Ambulatory Visit (INDEPENDENT_AMBULATORY_CARE_PROVIDER_SITE_OTHER): Payer: Medicare Other | Admitting: Family

## 2021-05-23 VITALS — BP 130/70 | HR 67 | Temp 98.1°F | Ht 72.0 in | Wt 209.4 lb

## 2021-05-23 DIAGNOSIS — I10 Essential (primary) hypertension: Secondary | ICD-10-CM

## 2021-05-23 DIAGNOSIS — R748 Abnormal levels of other serum enzymes: Secondary | ICD-10-CM | POA: Diagnosis not present

## 2021-05-23 DIAGNOSIS — G8929 Other chronic pain: Secondary | ICD-10-CM | POA: Diagnosis not present

## 2021-05-23 DIAGNOSIS — E118 Type 2 diabetes mellitus with unspecified complications: Secondary | ICD-10-CM

## 2021-05-23 DIAGNOSIS — M545 Low back pain, unspecified: Secondary | ICD-10-CM

## 2021-05-23 LAB — POCT GLYCOSYLATED HEMOGLOBIN (HGB A1C): Hemoglobin A1C: 6.5 % — AB (ref 4.0–5.6)

## 2021-05-23 NOTE — Progress Notes (Signed)
? ?Subjective:  ? ? Patient ID: [name] [name], male    DOB: 1940-09-14, 81 y.o.   MRN: [id number] ? ?CC: [name] is a 81 y.o. male who presents today for follow up.  ? ?HPI: Feels well today ?No new complaints ? ?HTN-compliant with Coreg 3.'125mg'$  bid, lisinopril '10mg'$ , spironolactone '25mg'$  ? ?Low back pain-Improved 'lots' with PT. He continues to do exercises at home with relief. Low back pain worse after long periods of sitting.   ?compliant with tramadol 50 mg twice daily.  Compliant with gabapentin 100 mg nightly with relief. He questions is tramadol effective.  ?  ?AST slightly elevated, 53.  He is no longer on acetaminophen.  Compliant with rosuvastatin '10mg'$ . Mild hepatic steatosis seen MRI abdomen 07/01/2017 ? ?CT abdomen and pelvis 03/16/21 ?obtained from urology.  Liver is normal in size and contour.  No intrahepatic or extrahepatic biliary ductal dilatation ?Right adrenal lesion 1.3 cm,bilateral nephrolithiasis. ?Fluid-filled renal lesion, incompletely characterized ? ?Following with Dr Versie Starks for renal cysts, right adrenal nodule. Follow up with [name] 08/30/21 ? ?DM-compliant with Ozempic 1 mg, glipizide 5 mg every morning, 10 mg with supper.  ?No hypoglycemic episodes. FBG 125.  ? ?HISTORY:  ?Past Medical History:  ?Diagnosis Date  ? Aortic insufficiency   ? a. noted on TTE 2015; b. 06/2019 Echo: AI not visualized.  ? Arthritis   ? CAD (coronary artery disease)   ? a. remote PCI in 1991 and 2005; b. MV 3/15: old inferior MI, no ischemia, LVEF 50%, slight inferior wall hypokniesis; c. 06/2019 NSTEMI/PCI: LM nl, LAD 80p/m (Atherectomy & 4.5x18 Resolute Onyx DES), 1md, D1 75 (PTCA), RI patent stent, LCX nl, RCA 100p, RPAV fills via L->R collats from LCX.  ? Chicken pox   ? Colon polyps   ? 4 pre-cancerous   ? Diverticulitis   ? DM type 2 (diabetes mellitus, type 2) ([place]   ? Family history of adverse reaction to anesthesia   ? GERD (gastroesophageal reflux disease)   ? Heart murmur   ? History of  kidney stones   ? HOH (hard of hearing)   ? Hyperlipidemia   ? Hypertension   ? Ischemic cardiomyopathy   ? a. TTE 2015: EF  50-55%, mild global HK; b. 06/2019 Echo: EF 45-50%, Gr1 DD, basal inf AK. Triv MR.  ? Kidney stones   ? Melanoma ([facility] 1980  ? Resected from his back  ? Mitral regurgitation   ? a. noted on TTE 2015  ? Myocardial infarction ([facility]   ? ?Past Surgical History:  ?Procedure Laterality Date  ? BIV UPGRADE N/A 02/26/2021  ? Procedure: BIV ICD UPGRADE;  Surgeon: LVickie Epley MD;  [facility];  Service: Cardiovascular;  Laterality: N/A;  ? CHOLECYSTECTOMY  2012  ? COLONOSCOPY    ? in 2003 with polyp removed and leak anastomosis had to have open abdominal surgery   ? CORONARY ANGIOPLASTY WITH STENT PLACEMENT  1991 & 2005  ? CORONARY ATHERECTOMY N/A 07/08/2019  ? Procedure: CORONARY ATHERECTOMY;  Surgeon: VJettie Booze MD;  [place] [facility];  Service: Cardiovascular;  Laterality: N/A;  ? CORONARY BALLOON ANGIOPLASTY N/A 07/08/2019  ? Procedure: CORONARY BALLOON ANGIOPLASTY;  Surgeon: VJettie Booze MD;  [facility];  Service: Cardiovascular;  Laterality: N/A;  diagonal ?  ? CORONARY STENT INTERVENTION N/A 07/08/2019  ? Procedure: CORONARY STENT INTERVENTION;  Surgeon: VJettie Booze MD;  [facility];  Service:  Cardiovascular;  Laterality: N/A;  lad  ? CYSTOSCOPY/URETEROSCOPY/HOLMIUM LASER/STENT PLACEMENT Right 01/02/2018  ? Procedure: CYSTOSCOPY/URETEROSCOPY/HOLMIUM LASER/STENT PLACEMENT;  Surgeon: [name], MD;  Location: [facility];  Service: Urology;  Laterality: Right;  ? ESOPHAGOGASTRODUODENOSCOPY (EGD) WITH PROPOFOL N/A 11/19/2016  ? Procedure: ESOPHAGOGASTRODUODENOSCOPY (EGD) WITH PROPOFOL;  Surgeon: [name], MD;  Location: [facility] ENDOSCOPY;  Service: Endoscopy;  Laterality: N/A;  ? ESOPHAGOGASTRODUODENOSCOPY (EGD) WITH PROPOFOL N/A 02/02/2019  ? Procedure: ESOPHAGOGASTRODUODENOSCOPY (EGD) WITH PROPOFOL;   Surgeon: [name], MD;  Location: [facility] ENDOSCOPY;  Service: Endoscopy;  Laterality: N/A;  ? ICD IMPLANT N/A 02/02/2020  ? Procedure: ICD IMPLANT;  Surgeon: Vickie Epley, MD;  Location: [place] [facility];  Service: Cardiovascular;  Laterality: N/A;  ? INTRAVASCULAR PRESSURE WIRE/FFR STUDY N/A 07/07/2019  ? Procedure: INTRAVASCULAR PRESSURE WIRE/FFR STUDY;  Surgeon: Nelva Bush, MD;  Location: [place] [facility];  Service: Cardiovascular;  Laterality: N/A;  ? INTRAVASCULAR ULTRASOUND/IVUS N/A 07/08/2019  ? Procedure: Intravascular Ultrasound/IVUS;  Surgeon: Jettie Booze, MD;  Location: [place] [facility];  Service: Cardiovascular;  Laterality: N/A;  ? LEAD REVISION/REPAIR N/A 02/16/2020  ? Procedure: LEAD REVISION/REPAIR;  Surgeon: Constance Haw, MD;  Location: [place] [facility];  Service: Cardiovascular;  Laterality: N/A;  ? LEFT HEART CATH N/A 07/08/2019  ? Procedure: Left Heart Cath;  Surgeon: Jettie Booze, MD;  Location: [place] [facility];  Service: Cardiovascular;  Laterality: N/A;  ? LEFT HEART CATH AND CORONARY ANGIOGRAPHY N/A 07/07/2019  ? Procedure: LEFT HEART CATH AND CORONARY ANGIOGRAPHY;  Surgeon: Nelva Bush, MD;  Location: [place] [facility];  Service: Cardiovascular;  Laterality: N/A;  ? LITHOTRIPSY  2015  ? MELANOMA EXCISION  1980  ? malignant  ? NERVE SURGERY  2015  ? ulna nerve  ? TONSILLECTOMY  1945  ? WISDOM TOOTH EXTRACTION    ? ?Family History  ?Problem Relation Age of Onset  ? Hyperlipidemia Mother   ? Hypertension Mother   ? Heart disease Mother   ? Diabetes Mother   ? Heart attack Mother   ? Colon cancer Father   ?     metasized to liver, adrenal, lungs  ? Lung cancer Father   ? Kidney cancer Father   ?     malignant capsulated kidney tumor  ? Diabetes Father   ? Liver cancer Father   ? Bladder Cancer Neg Hx   ? Prostate cancer Neg Hx   ? ? ?Allergies: Metformin and related, Azithromycin, Other, Percocet [oxycodone-acetaminophen], and Jardiance  [empagliflozin] ?Current Outpatient Medications on File Prior to Visit  ?Medication Sig Dispense Refill  ? acetaminophen (TYLENOL) 500 MG tablet Take 500 mg by mouth every 6 (six) hours as needed (pain.).    ? amiodarone (PACERONE) 200 MG tablet Take 1 tablet (200 mg total) by mouth daily. 90 tablet 0  ? aspirin EC 81 MG tablet Take 1 tablet (81 mg total) by mouth daily. Swallow whole. (Patient taking differently: Take 81 mg by mouth daily with lunch. Swallow whole.) 90 tablet 3  ? carvedilol (COREG) 3.125 MG tablet Take 1 tablet (3.125 mg total) by mouth 2 (two) times daily with a meal. 60 tablet 5  ? clopidogrel (PLAVIX) 75 MG tablet TAKE 1 TABLET(75 MG) BY MOUTH DAILY 30 tablet 5  ? docusate sodium (COLACE) 100 MG capsule Take 100 mg by mouth in the morning, at noon, and at bedtime.    ? ezetimibe (ZETIA) 10 MG tablet TAKE 1 TABLET(10 MG) BY MOUTH DAILY  90 tablet 2  ? famotidine (PEPCID) 20 MG tablet Take 20 mg by mouth daily with lunch.    ? fluticasone (FLONASE) 50 MCG/ACT nasal spray Place into both nostrils daily as needed for allergies or rhinitis.    ? gabapentin (NEURONTIN) 100 MG capsule Take 1 capsule (100 mg total) by mouth 3 (three) times daily. 90 capsule 3  ? glipiZIDE (GLUCOTROL) 10 MG tablet Take glipizide '5mg'$  qam breakfast, and '10mg'$  qpm with supper. 120 tablet 3  ? lisinopril (ZESTRIL) 10 MG tablet TAKE 1 TABLET BY MOUTH DAILY 90 tablet 1  ? midodrine (PROAMATINE) 5 MG tablet Take 1 tablet (5 mg total) by mouth 2 (two) times daily as needed. Take when BP does drop less than 110 when standing. Additional dose at 2 pm if still dropping below 110 when standing 90 tablet 3  ? mometasone (ELOCON) 0.1 % cream Apply to affected areas at elbow twice daily up to 5 days a week as needed. Avoid applying to face, groin, and axilla. Use as directed. Long-term use can cause thinning of the skin. 45 g 2  ? Multiple Vitamin (MULTIVITAMIN) tablet Take 1 tablet by mouth daily with lunch.    ? nitroGLYCERIN  (NITROSTAT) 0.4 MG SL tablet Place 1 tablet (0.4 mg total) under the tongue every 5 (five) minutes x 3 doses as needed for chest pain. 25 tablet 0  ? Omega-3 Fatty Acids (FISH OIL) 1000 MG CAPS Take 1,000 mg by

## 2021-05-24 ENCOUNTER — Encounter: Payer: Self-pay | Admitting: Family

## 2021-05-24 ENCOUNTER — Other Ambulatory Visit: Payer: Self-pay | Admitting: Family

## 2021-05-24 DIAGNOSIS — R899 Unspecified abnormal finding in specimens from other organs, systems and tissues: Secondary | ICD-10-CM

## 2021-05-24 NOTE — Assessment & Plan Note (Signed)
Chronic, improved on 100 mg gabapentin nightly.  He questions whether tramadol is effective.  Advised that he may slowly decrease tramadol and in its place titrate gabapentin to 100 mg 3 times daily.   will follow ?

## 2021-05-24 NOTE — Progress Notes (Signed)
close

## 2021-05-24 NOTE — Assessment & Plan Note (Signed)
Lab Results  ?Component Value Date  ? HGBA1C 6.5 (A) 05/23/2021  ? ?Chronic, excellent control.  Continue Ozempic 1 mg, glipizide 5 mg every morning, 10 mg with supper.  ?

## 2021-05-24 NOTE — Assessment & Plan Note (Signed)
Chronic, unchanged.  Discussed mild elevation of AST  could be from mild hepatic stenosis, and/or statin.  Pending further labs to fractionate bilirubin.  ?

## 2021-05-28 ENCOUNTER — Other Ambulatory Visit (INDEPENDENT_AMBULATORY_CARE_PROVIDER_SITE_OTHER): Payer: Medicare Other

## 2021-05-28 DIAGNOSIS — R899 Unspecified abnormal finding in specimens from other organs, systems and tissues: Secondary | ICD-10-CM | POA: Diagnosis not present

## 2021-05-28 DIAGNOSIS — R748 Abnormal levels of other serum enzymes: Secondary | ICD-10-CM

## 2021-05-28 LAB — HEPATIC FUNCTION PANEL
ALT: 50 U/L (ref 0–53)
AST: 56 U/L — ABNORMAL HIGH (ref 0–37)
Albumin: 4 g/dL (ref 3.5–5.2)
Alkaline Phosphatase: 97 U/L (ref 39–117)
Bilirubin, Direct: 0.2 mg/dL (ref 0.0–0.3)
Total Bilirubin: 1 mg/dL (ref 0.2–1.2)
Total Protein: 6.8 g/dL (ref 6.0–8.3)

## 2021-05-28 LAB — GAMMA GT: GGT: 123 U/L — ABNORMAL HIGH (ref 7–51)

## 2021-05-29 ENCOUNTER — Ambulatory Visit (INDEPENDENT_AMBULATORY_CARE_PROVIDER_SITE_OTHER): Payer: Medicare Other

## 2021-05-29 DIAGNOSIS — I255 Ischemic cardiomyopathy: Secondary | ICD-10-CM | POA: Diagnosis not present

## 2021-05-29 LAB — ACUTE HEP PANEL AND HEP B SURFACE AB
HEPATITIS C ANTIBODY REFILL$(REFL): NONREACTIVE
Hep A IgM: NONREACTIVE
Hep B C IgM: NONREACTIVE
Hepatitis B Surface Ag: NONREACTIVE
SIGNAL TO CUT-OFF: 0.42 (ref <1.00)

## 2021-05-29 LAB — CUP PACEART REMOTE DEVICE CHECK
Battery Remaining Longevity: 132 mo
Battery Remaining Percentage: 100 %
Brady Statistic RA Percent Paced: 7 %
Brady Statistic RV Percent Paced: 95 %
Date Time Interrogation Session: 20230516002100
HighPow Impedance: 80 Ohm
Implantable Lead Implant Date: 20220202
Implantable Lead Implant Date: 20230213
Implantable Lead Implant Date: 20230213
Implantable Lead Location: 753858
Implantable Lead Location: 753859
Implantable Lead Location: 753860
Implantable Lead Model: 273
Implantable Lead Model: 4671
Implantable Lead Model: 7841
Implantable Lead Serial Number: 111255
Implantable Lead Serial Number: 1233258
Implantable Lead Serial Number: 861310
Implantable Pulse Generator Implant Date: 20230213
Lead Channel Impedance Value: 399 Ohm
Lead Channel Impedance Value: 550 Ohm
Lead Channel Impedance Value: 592 Ohm
Lead Channel Pacing Threshold Amplitude: 0.9 V
Lead Channel Pacing Threshold Pulse Width: 0.4 ms
Lead Channel Setting Pacing Amplitude: 1.5 V
Lead Channel Setting Pacing Amplitude: 2 V
Lead Channel Setting Pacing Amplitude: 3.5 V
Lead Channel Setting Pacing Pulse Width: 0.4 ms
Lead Channel Setting Pacing Pulse Width: 1 ms
Lead Channel Setting Sensing Sensitivity: 0.6 mV
Lead Channel Setting Sensing Sensitivity: 1 mV
Pulse Gen Serial Number: 279753

## 2021-05-29 LAB — IRON,TIBC AND FERRITIN PANEL
%SAT: 25 % (calc) (ref 20–48)
Ferritin: 145 ng/mL (ref 24–380)
Iron: 99 ug/dL (ref 50–180)
TIBC: 400 mcg/dL (calc) (ref 250–425)

## 2021-05-29 LAB — BILIRUBIN, FRACTIONATED(TOT/DIR/INDIR)
Bilirubin, Direct: 0.2 mg/dL (ref 0.0–0.2)
Indirect Bilirubin: 0.7 mg/dL (calc) (ref 0.2–1.2)
Total Bilirubin: 0.9 mg/dL (ref 0.2–1.2)

## 2021-05-29 LAB — REFLEX TIQ

## 2021-06-05 ENCOUNTER — Encounter: Payer: Self-pay | Admitting: Internal Medicine

## 2021-06-06 ENCOUNTER — Ambulatory Visit (INDEPENDENT_AMBULATORY_CARE_PROVIDER_SITE_OTHER): Payer: Medicare Other | Admitting: Cardiology

## 2021-06-06 ENCOUNTER — Encounter: Payer: Self-pay | Admitting: Cardiology

## 2021-06-06 VITALS — BP 103/61 | HR 60 | Ht 72.0 in | Wt 212.0 lb

## 2021-06-06 DIAGNOSIS — I5022 Chronic systolic (congestive) heart failure: Secondary | ICD-10-CM | POA: Diagnosis not present

## 2021-06-06 DIAGNOSIS — I472 Ventricular tachycardia, unspecified: Secondary | ICD-10-CM | POA: Diagnosis not present

## 2021-06-06 DIAGNOSIS — Z95810 Presence of automatic (implantable) cardiac defibrillator: Secondary | ICD-10-CM | POA: Diagnosis not present

## 2021-06-06 DIAGNOSIS — I442 Atrioventricular block, complete: Secondary | ICD-10-CM | POA: Diagnosis not present

## 2021-06-06 LAB — PACEMAKER DEVICE OBSERVATION

## 2021-06-06 NOTE — Progress Notes (Signed)
Electrophysiology Office Follow up Visit Note:    Date:  06/06/2021   ID:  [name] [name], DOB December 13, 1940, MRN [id number]  PCP:  [name], [facility] Cardiologist:  Ida Rogue, MD  [facility] Electrophysiologist:  Vickie Epley, MD    Interval History:    [name] [name] is a 81 y.o. male who presents for a follow up visit after a BiV upgrade to his ICD system on February 26, 2021.  Follow-up remote device interrogations have shown stable lead parameters and at least 95% BiV pacing. Patient tells me he is back smoking a pipe intermittently. Has felt well since CRT upgrade.       Past Medical History:  Diagnosis Date   Aortic insufficiency    a. noted on TTE 2015; b. 06/2019 Echo: AI not visualized.   Arthritis    CAD (coronary artery disease)    a. remote PCI in 1991 and 2005; b. MV 3/15: old inferior MI, no ischemia, LVEF 50%, slight inferior wall hypokniesis; c. 06/2019 NSTEMI/PCI: LM nl, LAD 80p/m (Atherectomy & 4.5x18 Resolute Onyx DES), 78md, D1 75 (PTCA), RI patent stent, LCX nl, RCA 100p, RPAV fills via L->R collats from LCX.   Chicken pox    Colon polyps    4 pre-cancerous    Diverticulitis    DM type 2 (diabetes mellitus, type 2) (HCC)    Family history of adverse reaction to anesthesia    GERD (gastroesophageal reflux disease)    Heart murmur    History of kidney stones    HOH (hard of hearing)    Hyperlipidemia    Hypertension    Ischemic cardiomyopathy    a. TTE 2015: EF  50-55%, mild global HK; b. 06/2019 Echo: EF 45-50%, Gr1 DD, basal inf AK. Triv MR.   Kidney stones    Melanoma ([place]   Resected from his back   Mitral regurgitation    a. noted on TTE 2015   Myocardial infarction ([facility]     Past Surgical History:  Procedure Laterality Date   BIV UPGRADE N/A 02/26/2021   Procedure: BIV ICD UPGRADE;  Surgeon: LVickie Epley MD;  [place] [facility];  Service: Cardiovascular;  Laterality: N/A;    CHOLECYSTECTOMY  2012   COLONOSCOPY     in 2003 with polyp removed and leak anastomosis [place] have [place]    [place]   CORONARY ATHERECTOMY N/A 07/08/2019   Procedure: CORONARY ATHERECTOMY;  Surgeon: VJettie Booze MD;  [place] [facility] [place];  Service: Cardiovascular;  Laterality: N/A;   CORONARY BALLOON ANGIOPLASTY N/A 07/08/2019   Procedure: CORONARY BALLOON ANGIOPLASTY;  Surgeon: VJettie Booze MD;  [place] [facility];  Service: Cardiovascular;  Laterality: N/A;  diagonal    CORONARY STENT INTERVENTION N/A 07/08/2019   Procedure: CORONARY STENT INTERVENTION;  Surgeon: VJettie Booze MD;  [place];  Service: Cardiovascular;  Laterality: N/A;  lad   CYSTOSCOPY/URETEROSCOPY/HOLMIUM LASER/STENT PLACEMENT Right 01/02/2018   Procedure: CYSTOSCOPY/URETEROSCOPY/HOLMIUM LASER/STENT PLACEMENT;  Surgeon: SBilley Co MD;  Location: [facility];  Service: Urology;  Laterality: Right;   ESOPHAGOGASTRODUODENOSCOPY (EGD) WITH PROPOFOL N/A 11/19/2016   Procedure: ESOPHAGOGASTRODUODENOSCOPY (EGD) WITH PROPOFOL;  Surgeon: WLucilla Lame MD;  Location: [facility] ENDOSCOPY;  Service: Endoscopy;  Laterality: N/A;   ESOPHAGOGASTRODUODENOSCOPY (EGD) WITH PROPOFOL N/A 02/02/2019   Procedure: ESOPHAGOGASTRODUODENOSCOPY (EGD) WITH PROPOFOL;  Surgeon: WLucilla Lame MD;  [facility];  Service: Endoscopy;  Laterality: N/A;   ICD IMPLANT N/A 02/02/2020   Procedure: ICD IMPLANT;  Surgeon: Vickie Epley, MD;  Location: [place] [facility];  Service: Cardiovascular;  Laterality: N/A;   INTRAVASCULAR PRESSURE WIRE/FFR STUDY N/A 07/07/2019   Procedure: INTRAVASCULAR PRESSURE WIRE/FFR STUDY;  Surgeon: Nelva Bush, MD;  Location: [place] [facility];  Service: Cardiovascular;  Laterality: N/A;   INTRAVASCULAR ULTRASOUND/IVUS N/A 07/08/2019   Procedure: Intravascular Ultrasound/IVUS;  Surgeon:  Jettie Booze, MD;  Location: [place] [facility];  Service: Cardiovascular;  Laterality: N/A;   LEAD REVISION/REPAIR N/A 02/16/2020   Procedure: LEAD REVISION/REPAIR;  Surgeon: Constance Haw, MD;  Location: [place] [facility];  Service: Cardiovascular;  Laterality: N/A;   LEFT HEART CATH N/A 07/08/2019   Procedure: Left Heart Cath;  Surgeon: Jettie Booze, MD;  Location: [place] [facility];  Service: Cardiovascular;  Laterality: N/A;   LEFT HEART CATH AND CORONARY ANGIOGRAPHY N/A 07/07/2019   Procedure: LEFT HEART CATH AND CORONARY ANGIOGRAPHY;  Surgeon: Nelva Bush, MD;  Location: [place] [facility];  Service: Cardiovascular;  Laterality: N/A;   LITHOTRIPSY  2015   MELANOMA EXCISION  1980   malignant   NERVE SURGERY  2015   ulna nerve   TONSILLECTOMY  1945   WISDOM TOOTH EXTRACTION      Current Medications: Current Meds  Medication Sig   acetaminophen (TYLENOL) 500 MG tablet Take 500 mg by mouth every 6 (six) hours as needed (pain.).   amiodarone (PACERONE) 200 MG tablet Take 1 tablet (200 mg total) by mouth daily.   aspirin EC 81 MG tablet Take 1 tablet (81 mg total) by mouth daily. Swallow whole. (Patient taking differently: Take 81 mg by mouth daily with lunch. Swallow whole.)   carvedilol (COREG) 3.125 MG tablet Take 1 tablet (3.125 mg total) by mouth 2 (two) times daily with a meal.   clopidogrel (PLAVIX) 75 MG tablet TAKE 1 TABLET(75 MG) BY MOUTH DAILY   docusate sodium (COLACE) 100 MG capsule Take 100 mg by mouth in the morning, at noon, and at bedtime.   ezetimibe (ZETIA) 10 MG tablet TAKE 1 TABLET(10 MG) BY MOUTH DAILY   famotidine (PEPCID) 20 MG tablet Take 20 mg by mouth daily with lunch.   fluticasone (FLONASE) 50 MCG/ACT nasal spray Place into both nostrils daily as needed for allergies or rhinitis.   gabapentin (NEURONTIN) 100 MG capsule Take 1 capsule (100 mg total) by mouth 3 (three) times daily.   glipiZIDE (GLUCOTROL) 10 MG tablet Take glipizide  '5mg'$  qam breakfast, and '10mg'$  qpm with supper.   lisinopril (ZESTRIL) 10 MG tablet TAKE 1 TABLET BY MOUTH DAILY   midodrine (PROAMATINE) 5 MG tablet Take 1 tablet (5 mg total) by mouth 2 (two) times daily as needed. Take when BP does drop less than 110 when standing. Additional dose at 2 pm if still dropping below 110 when standing   mometasone (ELOCON) 0.1 % cream Apply to affected areas at elbow twice daily up to 5 days a week as needed. Avoid applying to face, groin, and axilla. Use as directed. Long-term use can cause thinning of the skin.   Multiple Vitamin (MULTIVITAMIN) tablet Take 1 tablet by mouth daily with lunch.   nitroGLYCERIN (NITROSTAT) 0.4 MG SL tablet Place 1 tablet (0.4 mg total) under the tongue every 5 (five) minutes x 3 doses as needed for chest pain.   Omega-3 Fatty Acids (FISH OIL) 1000 MG CAPS Take 1,000  mg by mouth daily with lunch.   ONETOUCH VERIO test strip USE TO CHECK BLOOD SUGAR TWICE DAILY   pantoprazole (PROTONIX) 40 MG tablet Take 1 tablet (40 mg total) by mouth daily.   psyllium (METAMUCIL) 58.6 % powder Take 1 packet by mouth at bedtime.   rosuvastatin (CRESTOR) 10 MG tablet TAKE 1 TABLET(10 MG) BY MOUTH DAILY   Semaglutide, 1 MG/DOSE, (OZEMPIC, 1 MG/DOSE,) 4 MG/3ML SOPN Inject 1 mg into the skin every Sunday.   sodium chloride (OCEAN) 0.65 % SOLN nasal spray Place 1 spray into both nostrils as needed for congestion.   spironolactone (ALDACTONE) 25 MG tablet TAKE 1 TABLET(25 MG) BY MOUTH DAILY   tamsulosin (FLOMAX) 0.4 MG CAPS capsule TAKE ONE CAPSULE BY MOUTH DAILY (Patient taking differently: Take 0.4 mg by mouth daily with lunch. TAKE ONE CAPSULE BY MOUTH DAILY)   traMADol (ULTRAM) 50 MG tablet TAKE 1 TABLET BY MOUTH EVERY 12 HOURS AS NEEDED     Allergies:   Metformin and related, Azithromycin, Other, Percocet [oxycodone-acetaminophen], and Jardiance [empagliflozin]   Social History   Socioeconomic History   Marital status: Married    Spouse name: Golden Circle    Number of children: Not on file   Years of education: Not on file   Highest education level: Not on file  Occupational History   Not on file  Tobacco Use   Smoking status: Some Days    Types: Pipe   Smokeless tobacco: Former    Quit date: 10/17/2015  Vaping Use   Vaping Use: Never used  Substance and Sexual Activity   Alcohol use: Not Currently    Alcohol/week: 0.0 - 1.0 standard drinks   Drug use: No   Sexual activity: Not on file  Other Topics Concern   Not on file  Social History Narrative   Married    Social Determinants of Health   Financial Resource Strain: Medium Risk   Difficulty of Paying Living Expenses: Somewhat hard  Food Insecurity: No Food Insecurity   Worried About Charity fundraiser in the Last Year: Never true   Ran Out of Food in the Last Year: Never true  Transportation Needs: No Transportation Needs   Lack of Transportation (Medical): No   Lack of Transportation (Non-Medical): No  Physical Activity: Insufficiently Active   Days of Exercise per Week: 3 days   Minutes of Exercise per Session: 30 min  Stress: No Stress Concern Present   Feeling of Stress : Not at all  Social Connections: Unknown   Frequency of Communication with Friends and Family: Not on file   Frequency of Social Gatherings with Friends and Family: Not on file   Attends Religious Services: Not on Electrical engineer or Organizations: Not on file   Attends Archivist Meetings: Not on file   Marital Status: Married     Family History: The patient's family history includes Colon cancer in his father; Diabetes in his father and mother; Heart attack in his mother; Heart disease in his mother; Hyperlipidemia in his mother; Hypertension in his mother; Kidney cancer in his father; Liver cancer in his father; Lung cancer in his father. There is no history of Bladder Cancer or Prostate cancer.  ROS:   Please see the history of present illness.    All other systems  reviewed and are negative.  EKGs/Labs/Other Studies Reviewed:    The following studies were reviewed today:  Jun 06, 2021 in clinic device interrogation personally  reviewed Lead parameters stable No high-voltage therapies Battery longevity 11 years Lead parameters all stable Changed atrial lead outputs to maximize battery longevity BiV pacing 96%  EKG:  The ekg ordered today demonstrates atrial sensing, BiV pacing  Recent Labs: 08/02/2020: TSH 2.470 02/21/2021: BUN 30; Creatinine, Ser 1.24; Potassium 4.8; Sodium 132 03/09/2021: Hemoglobin 13.9; Platelets 206.0 05/28/2021: ALT 50  Recent Lipid Panel    Component Value Date/Time   CHOL 122 04/06/2021 0909   TRIG 226.0 (H) 04/06/2021 0909   HDL 35.60 (L) 04/06/2021 0909   CHOLHDL 3 04/06/2021 0909   VLDL 45.2 (H) 04/06/2021 0909   LDLCALC 35 12/21/2019 0919   LDLDIRECT 59.0 04/06/2021 0909    Physical Exam:    VS:  BP 103/61   Pulse 60   Ht 6' (1.829 m)   Wt 212 lb (96.2 kg)   SpO2 97%   BMI 28.75 kg/m     Wt Readings from Last 3 Encounters:  06/06/21 212 lb (96.2 kg)  05/23/21 209 lb 6.4 oz (95 kg)  03/12/21 209 lb 3.2 oz (94.9 kg)     GEN:  Well nourished, well developed in no acute distress HEENT: Normal NECK: No JVD; No carotid bruits LYMPHATICS: No lymphadenopathy CARDIAC: RRR, no murmurs, rubs, gallops.  ICD pocket well-healed RESPIRATORY:  Clear to auscultation without rales, wheezing or rhonchi  ABDOMEN: Soft, non-tender, non-distended MUSCULOSKELETAL:  No edema; No deformity  SKIN: Warm and dry NEUROLOGIC:  Alert and oriented x 3 PSYCHIATRIC:  Normal affect        ASSESSMENT:    1. Chronic systolic heart failure ([place])   2. Heart block AV complete ([place])   3. VT (ventricular tachycardia) (HCC)   4. Cardiac resynchronization therapy defibrillator (CRT-D) in place    PLAN:    In order of problems listed above:  #Chronic systolic heart failure NYHA class I-II.  Warm and dry on exam today.  ICD  in place functioning appropriately.  Continue medical therapy including Coreg, lisinopril.  I will repeat his echocardiogram given recent upgrade to CRT.  #Complete heart block #VT #CRT-D in situ Has been electrically stable on amiodarone 200 mg by mouth daily.  Liver enzymes are stable with a very mildly elevated AST.  GGT also elevated.  Follows with Dr. [name] in GI. I would not recommend stopping his statin at this time given his extensive history of heart disease.  Follow-up in 6 months.     Medication Adjustments/Labs and Tests Ordered: Current medicines are reviewed at length with the patient today.  Concerns regarding medicines are outlined above.  Orders Placed This Encounter  Procedures   EKG 12-Lead   ECHOCARDIOGRAM COMPLETE   No orders of the defined types were placed in this encounter.    Signed, Lars Mage, MD, [facility], [facility] 06/06/2021 10:42 AM    Electrophysiology [facility]

## 2021-06-06 NOTE — Patient Instructions (Addendum)
Medications: Your physician recommends that you continue on your current medications as directed. Please refer to the Current Medication list given to you today. *If you need a refill on your cardiac medications before your next appointment, please call your pharmacy*  Lab Work: None. If you have labs (blood work) drawn today and your tests are completely normal, you will receive your results only by: [place] (if you have MyChart) OR A paper copy in the mail If you have any lab test that is abnormal or we need to change your treatment, we will call you to review the results.  Testing/Procedures: Your physician has requested that you have an echocardiogram. Echocardiography is a painless test that uses sound waves to create images of your heart. It provides your doctor with information about the size and shape of your heart and how well your heart's chambers and valves are working. This procedure takes approximately one hour. There are no restrictions for this procedure.   Follow-Up: At [facility], you and your health needs are our priority.  As part of our continuing mission to provide you with exceptional heart care, we have created designated Provider Care Teams.  These Care Teams include your primary Cardiologist (physician) and Advanced Practice Providers (APPs -  Physician Assistants and Nurse Practitioners) who all work together to provide you with the care you need, when you need it.  Your physician wants you to follow-up in: 6 months with Lars Mage, MD   We recommend signing up for the patient portal called "MyChart".  Sign up information is provided on this After Visit Summary.  MyChart is used to connect with patients for Virtual Visits (Telemedicine).  Patients are able to view lab/test results, encounter notes, upcoming appointments, etc.  Non-urgent messages can be sent to your provider as well.   To learn more about what you can do with MyChart, go to  [email or web address].    Any Other Special Instructions Will Be Listed Below (If Applicable)

## 2021-06-08 ENCOUNTER — Other Ambulatory Visit: Payer: Self-pay | Admitting: Family

## 2021-06-08 DIAGNOSIS — G8929 Other chronic pain: Secondary | ICD-10-CM

## 2021-06-14 NOTE — Progress Notes (Signed)
Remote ICD transmission.   

## 2021-06-15 ENCOUNTER — Other Ambulatory Visit: Payer: Self-pay

## 2021-06-15 MED ORDER — PANTOPRAZOLE SODIUM 40 MG PO TBEC: 40.0000 mg | DELAYED_RELEASE_TABLET | Freq: Every day | ORAL | 1 refills | Status: DC

## 2021-06-15 NOTE — Telephone Encounter (Signed)
Follow up appt scheduled.

## 2021-06-20 ENCOUNTER — Ambulatory Visit: Payer: Medicare Other | Admitting: Dermatology

## 2021-06-25 ENCOUNTER — Encounter: Payer: Self-pay | Admitting: Internal Medicine

## 2021-06-25 ENCOUNTER — Encounter: Payer: Self-pay | Admitting: Cardiology

## 2021-06-27 ENCOUNTER — Telehealth: Payer: Self-pay

## 2021-06-27 NOTE — Telephone Encounter (Signed)
LMTCB to office to inquire about Ozempic from patient assistance

## 2021-07-04 ENCOUNTER — Telehealth: Payer: Self-pay

## 2021-07-04 NOTE — Telephone Encounter (Signed)
Called patient and informed him that his Ozempic from patient assistance is here and is available for pick up at his convenience.Placed upfront for patient to pick up

## 2021-07-12 ENCOUNTER — Ambulatory Visit (INDEPENDENT_AMBULATORY_CARE_PROVIDER_SITE_OTHER): Payer: Medicare Other

## 2021-07-12 DIAGNOSIS — Z95810 Presence of automatic (implantable) cardiac defibrillator: Secondary | ICD-10-CM | POA: Diagnosis not present

## 2021-07-12 DIAGNOSIS — I472 Ventricular tachycardia, unspecified: Secondary | ICD-10-CM | POA: Diagnosis not present

## 2021-07-12 DIAGNOSIS — I5022 Chronic systolic (congestive) heart failure: Secondary | ICD-10-CM

## 2021-07-12 DIAGNOSIS — I442 Atrioventricular block, complete: Secondary | ICD-10-CM | POA: Diagnosis not present

## 2021-07-12 LAB — ECHOCARDIOGRAM COMPLETE
AR max vel: 0.92 cm2
AV Area VTI: 1.01 cm2
AV Area mean vel: 0.84 cm2
AV Mean grad: 18 mmHg
AV Peak grad: 25 mmHg
Ao pk vel: 2.5 m/s
Area-P 1/2: 3.83 cm2
S' Lateral: 4 cm
Single Plane A4C EF: 46.8 %

## 2021-07-23 ENCOUNTER — Other Ambulatory Visit: Payer: Self-pay | Admitting: Cardiology

## 2021-07-23 ENCOUNTER — Ambulatory Visit: Payer: Medicare Other | Admitting: Dermatology

## 2021-07-23 ENCOUNTER — Other Ambulatory Visit: Payer: Self-pay | Admitting: Cardiovascular Disease

## 2021-07-23 DIAGNOSIS — L570 Actinic keratosis: Secondary | ICD-10-CM | POA: Diagnosis not present

## 2021-07-23 DIAGNOSIS — L821 Other seborrheic keratosis: Secondary | ICD-10-CM | POA: Diagnosis not present

## 2021-07-23 DIAGNOSIS — L820 Inflamed seborrheic keratosis: Secondary | ICD-10-CM

## 2021-07-23 DIAGNOSIS — L578 Other skin changes due to chronic exposure to nonionizing radiation: Secondary | ICD-10-CM

## 2021-07-23 DIAGNOSIS — D692 Other nonthrombocytopenic purpura: Secondary | ICD-10-CM

## 2021-07-23 MED ORDER — AMIODARONE HCL 200 MG PO TABS: 200.0000 mg | ORAL_TABLET | Freq: Every day | ORAL | 1 refills | Status: DC

## 2021-07-23 MED ORDER — FLUOROURACIL 5 % EX CREA: TOPICAL_CREAM | Freq: Two times a day (BID) | CUTANEOUS | 1 refills | Status: DC

## 2021-07-23 NOTE — Patient Instructions (Addendum)
Actinic keratoses are precancerous spots that appear secondary to cumulative UV radiation exposure/sun exposure over time. They are chronic with expected duration over 1 year. A portion of actinic keratoses will progress to squamous cell carcinoma of the skin. It is not possible to reliably predict which spots will progress to skin cancer and so treatment is recommended to prevent development of skin cancer.  Recommend daily broad spectrum sunscreen SPF 30+ to sun-exposed areas, reapply every 2 hours as needed.  Recommend staying in the shade or wearing long sleeves, sun glasses (UVA+UVB protection) and wide brim hats (4-inch brim around the entire circumference of the hat). Call for new or changing lesions.   Cryotherapy Aftercare  Wash gently with soap and water everyday.   Apply Vaseline and Band-Aid daily until healed.    September 1st  Start 5-fluorouracil/calcipotriene cream twice a day for 7 days to affected areas including scalp. Prescription sent to [facility]. Patient provided with contact information for pharmacy and advised the pharmacy will mail the prescription to their home. Patient provided with handout reviewing treatment course and side effects and advised to call or message [place] on MyChart with any concerns.   5-Fluorouracil/Calcipotriene Patient Education   Actinic keratoses are the dry, red scaly spots on the skin caused by sun damage. A portion of these spots can turn into skin cancer with time, and treating them can help prevent development of skin cancer.   Treatment of these spots requires removal of the defective skin cells. There are various ways to remove actinic keratoses, including freezing with liquid nitrogen, treatment with creams, or treatment with a blue light procedure in the office.   5-fluorouracil cream is a topical cream used to treat actinic keratoses. It works by interfering with the growth of abnormal fast-growing skin cells, such as actinic  keratoses. These cells peel off and are replaced by healthy ones.   5-fluorouracil/calcipotriene is a combination of the 5-fluorouracil cream with a vitamin D analog cream called calcipotriene. The calcipotriene alone does not treat actinic keratoses. However, when it is combined with 5-fluorouracil, it helps the 5-fluorouracil treat the actinic keratoses much faster so that the same results can be achieved with a much shorter treatment time.  INSTRUCTIONS FOR 5-FLUOROURACIL/CALCIPOTRIENE CREAM:   5-fluorouracil/calcipotriene cream typically only needs to be used for 4-7 days. A thin layer should be applied twice a day to the treatment areas recommended by your physician.   If your physician prescribed you separate tubes of 5-fluourouracil and calcipotriene, apply a thin layer of 5-fluorouracil followed by a thin layer of calcipotriene.   Avoid contact with your eyes, nostrils, and mouth. Do not use 5-fluorouracil/calcipotriene cream on infected or open wounds.   You will develop redness, irritation and some crusting at areas where you have pre-cancer damage/actinic keratoses. IF YOU DEVELOP PAIN, BLEEDING, OR SIGNIFICANT CRUSTING, STOP THE TREATMENT EARLY - you have already gotten a good response and the actinic keratoses should clear up well.  Wash your hands after applying 5-fluorouracil 5% cream on your skin.   A moisturizer or sunscreen with a minimum SPF 30 should be applied each morning.   Once you have finished the treatment, you can apply a thin layer of Vaseline twice a day to irritated areas to soothe and calm the areas more quickly. If you experience significant discomfort, contact your physician.  For some patients it is necessary to repeat the treatment for best results.  SIDE EFFECTS: When using 5-fluorouracil/calcipotriene cream, you may have mild irritation, such  as redness, dryness, swelling, or a mild burning sensation. This usually resolves within 2 weeks. The more actinic  keratoses you have, the more redness and inflammation you can expect during treatment. Eye irritation has been reported rarely. If this occurs, please let us know.  If you have any trouble using this cream, please call the office. If you have any other questions about this information, please do not hesitate to ask me before you leave the office.     Due to recent changes in healthcare laws, you may see results of your pathology and/or laboratory studies on MyChart before the doctors have had a chance to review them. We understand that in some cases there may be results that are confusing or concerning to you. Please understand that not all results are received at the same time and often the doctors may need to interpret multiple results in order to provide you with the best plan of care or course of treatment. Therefore, we ask that you please give [place] 2 business days to thoroughly review all your results before contacting the office for clarification. Should we see a critical lab result, you will be contacted sooner.   If You Need Anything After Your Visit  If you have any questions or concerns for your doctor, please call our main line at [phone] and press option 4 to reach your doctor's medical assistant. If no one answers, please leave a voicemail as directed and we will return your call as soon as possible. Messages left after 4 pm will be answered the following business day.   You may also send [place] a message via [place]. We typically respond to MyChart messages within 1-2 business days.  For prescription refills, please ask your pharmacy to contact our office. Our fax number is [phone].  If you have an urgent issue when the clinic is closed that cannot wait until the next business day, you can page your doctor at the number below.    Please note that while we do our best to be available for urgent issues outside of office hours, we are not available 24/7.   If you have an urgent  issue and are unable to reach [place], you may choose to seek medical care at your doctor's office, retail clinic, urgent care center, or emergency room.  If you have a medical emergency, please immediately call 911 or go to the emergency department.  Pager Numbers  - Dr. Nehemiah Massed: [phone]  - Dr. Laurence Ferrari: [phone]  - Dr. Nicole Kindred: [phone]  In the event of inclement weather, please call our main line at [phone] for an update on the status of any delays or closures.  Dermatology Medication Tips: Please keep the boxes that topical medications come in in order to help keep track of the instructions about where and how to use these. Pharmacies typically print the medication instructions only on the boxes and not directly on the medication tubes.   If your medication is too expensive, please contact our office at [phone] option 4 or send [place] a message through [place].   We are unable to tell what your co-pay for medications will be in advance as this is different depending on your insurance coverage. However, we may be able to find a substitute medication at lower cost or fill out paperwork to get insurance to cover a needed medication.   If a prior authorization is required to get your medication covered by your insurance company, please allow [place] 1-2 business days to  complete this process.  Drug prices often vary depending on where the prescription is filled and some pharmacies may offer cheaper prices.  The website www.goodrx.com contains coupons for medications through different pharmacies. The prices here do not account for what the cost may be with help from insurance (it may be cheaper with your insurance), but the website can give you the price if you did not use any insurance.  - You can print the associated coupon and take it with your prescription to the pharmacy.  - You may also stop by our office during regular business hours and pick up a GoodRx coupon card.  - If you  need your prescription sent electronically to a different pharmacy, notify our office through [facility] or by phone at [phone] option 4.     Si Usted Necesita Algo Despus de Su Visita  Tambin puede enviarnos un mensaje a travs de Pharmacist, community. Por lo general respondemos a los mensajes de MyChart en el transcurso de 1 a 2 das hbiles.  Para renovar recetas, por favor pida a su farmacia que se ponga en contacto con nuestra oficina. Harland Dingwall de fax es [place] [phone].  Si tiene un asunto urgente cuando la clnica est cerrada y que no puede esperar hasta el siguiente da hbil, puede llamar/localizar a su doctor(a) al nmero que aparece a continuacin.   Por favor, tenga en cuenta que aunque hacemos todo lo posible para estar disponibles para asuntos urgentes fuera del horario de [place], no estamos disponibles las 24 horas del da, los 7 das de la [place].   Si tiene un problema urgente y no puede comunicarse con nosotros, puede optar por buscar atencin mdica  en el consultorio de su doctor(a), en una clnica privada, en un centro de atencin urgente o en una sala de emergencias.  Si tiene Engineering geologist, por favor llame inmediatamente al 911 o vaya a la sala de emergencias.  Nmeros de bper  - Dr. Nehemiah Massed: [phone]  - Dra. Moye: [phone]  - Dra. Nicole Kindred: [phone]  En caso de inclemencias del [place], por favor llame a Johnsie Kindred principal al [phone] para una actualizacin sobre el [place] de cualquier retraso o cierre.  Consejos para la medicacin en dermatologa: Por favor, guarde las cajas en las que vienen los medicamentos de uso tpico para ayudarle a seguir las instrucciones sobre dnde y cmo usarlos. Las farmacias generalmente imprimen las instrucciones del medicamento slo en las cajas y no directamente en los tubos del [place].   Si su medicamento es muy caro, por favor, pngase en contacto con Zigmund Daniel llamando al  [phone] y presione la opcin 4 o envenos un mensaje a travs de Pharmacist, community.   No podemos decirle cul ser su copago por los medicamentos por adelantado ya que esto es diferente dependiendo de la cobertura de su seguro. Sin embargo, es posible que podamos encontrar un medicamento sustituto a Electrical engineer un formulario para que el seguro cubra el medicamento que se considera necesario.   Si se requiere una autorizacin previa para que su compaa de seguros [place] su medicamento, por favor permtanos de 1 a 2 das hbiles para completar este proceso.  Los precios de los medicamentos varan con frecuencia dependiendo del Environmental consultant de dnde se surte la receta y alguna farmacias pueden ofrecer precios ms baratos.  El sitio web www.goodrx.com tiene cupones para medicamentos de Airline pilot. Los precios aqu no tienen en cuenta lo que podra costar con la ayuda del  seguro (puede ser ms barato con su seguro), pero el sitio web puede darle el precio si no Field seismologist.  - Puede imprimir el cupn correspondiente y llevarlo con su receta a la farmacia.  - Tambin puede pasar por nuestra oficina durante el horario de atencin regular y Charity fundraiser una tarjeta de cupones de GoodRx.  - Si necesita que su receta se enve electrnicamente a una farmacia diferente, informe a nuestra oficina a travs de MyChart de [place] o por telfono llamando al [phone] y presione la opcin 4.

## 2021-07-23 NOTE — Progress Notes (Signed)
Follow-Up Visit   Subjective  [name] [name] is a 81 y.o. male who presents for the following: Other (6 month follow up on ak and isk. Spot at right forearm would like checked, states recently darkened in color. ). The patient has spots, moles and lesions to be evaluated, some may be new or changing and the patient has concerns that these could be cancer.  The following portions of the chart were reviewed this encounter and updated as appropriate:  Tobacco  Allergies  Meds  Problems  Med Hx  Surg Hx  Fam Hx     Review of Systems: No other skin or systemic complaints except as noted in HPI or Assessment and Plan.  Objective  Well appearing patient in no apparent distress; mood and affect are within normal limits.  A focused examination was performed including face, b/l arms, scalp. Relevant physical exam findings are noted in the Assessment and Plan.  scalp x 20 (20) Erythematous thin papules/macules with gritty scale. Erythematous thin papules/macules with gritty scale.   scalp x 7 (7) Erythematous stuck-on, waxy papule or plaque   Assessment & Plan  Actinic keratosis (20) scalp x 20  Actinic keratoses are precancerous spots that appear secondary to cumulative UV radiation exposure/sun exposure over time. They are chronic with expected duration over 1 year. A portion of actinic keratoses will progress to squamous cell carcinoma of the skin. It is not possible to reliably predict which spots will progress to skin cancer and so treatment is recommended to prevent development of skin cancer.  Recommend daily broad spectrum sunscreen SPF 30+ to sun-exposed areas, reapply every 2 hours as needed.  Recommend staying in the shade or wearing long sleeves, sun glasses (UVA+UVB protection) and wide brim hats (4-inch brim around the entire circumference of the hat). Call for new or changing lesions.  Discussed to start treatment September 1st  Start 5-fluorouracil/calcipotriene cream  twice a day for 7 days to affected areas including scalp . Prescription sent to [facility]. Patient provided with contact information for pharmacy and advised the pharmacy will mail the prescription to their home. Patient provided with handout reviewing treatment course and side effects and advised to call or message [place] on MyChart with any concerns.   Destruction of lesion - scalp x 20 Complexity: simple   Destruction method: cryotherapy   Informed consent: discussed and consent obtained   Timeout:  patient name, date of birth, surgical site, and procedure verified Lesion destroyed using liquid nitrogen: Yes   Region frozen until ice ball extended beyond lesion: Yes   Outcome: patient tolerated procedure well with no complications   Post-procedure details: wound care instructions given   Additional details:  Prior to procedure, discussed risks of blister formation, small wound, skin dyspigmentation, or rare scar following cryotherapy. Recommend Vaseline ointment to treated areas while healing.   fluorouracil (EFUDEX) 5 % cream - scalp x 20 Apply topically 2 (two) times daily. For 7 days at scalp  Inflamed seborrheic keratosis (7) scalp x 7  Symptomatic, irritating, patient would like treated.  Destruction of lesion - scalp x 7 Complexity: simple   Destruction method: cryotherapy   Informed consent: discussed and consent obtained   Timeout:  patient name, date of birth, surgical site, and procedure verified Lesion destroyed using liquid nitrogen: Yes   Region frozen until ice ball extended beyond lesion: Yes   Outcome: patient tolerated procedure well with no complications   Post-procedure details: wound care instructions given   Additional  details:  Prior to procedure, discussed risks of blister formation, small wound, skin dyspigmentation, or rare scar following cryotherapy. Recommend Vaseline ointment to treated areas while healing.    Seborrheic Keratoses - Stuck-on,  waxy, tan-brown papules and/or plaques  - Benign-appearing - Discussed benign etiology and prognosis. - Observe - Call for any changes  Purpura - Chronic; persistent and recurrent.  Treatable, but not curable. - Violaceous macules and patches - Benign - Related to trauma, age, sun damage and/or use of blood thinners, chronic use of topical and/or oral steroids - Observe - Can use OTC arnica containing moisturizer such as Dermend Bruise Formula if desired - Call for worsening or other concerns  Actinic Damage - Severe, confluent actinic changes with pre-cancerous actinic keratoses  - Severe, chronic, not at goal, secondary to cumulative UV radiation exposure over time - diffuse scaly erythematous macules and papules with underlying dyspigmentation - Discussed Prescription "Field Treatment" for Severe, Chronic Confluent Actinic Changes with Pre-Cancerous Actinic Keratoses Field treatment involves treatment of an entire area of skin that has confluent Actinic Changes (Sun/ Ultraviolet light damage) and PreCancerous Actinic Keratoses by method of PhotoDynamic Therapy (PDT) and/or prescription Topical Chemotherapy agents such as 5-fluorouracil, 5-fluorouracil/calcipotriene, and/or imiquimod.  The purpose is to decrease the number of clinically evident and subclinical PreCancerous lesions to prevent progression to development of skin cancer by chemically destroying early precancer changes that may or may not be visible.  It has been shown to reduce the risk of developing skin cancer in the treated area. As a result of treatment, redness, scaling, crusting, and open sores may occur during treatment course. One or more than one of these methods may be used and may have to be used several times to control, suppress and eliminate the PreCancerous changes. Discussed treatment course, expected reaction, and possible side effects. - Recommend daily broad spectrum sunscreen SPF 30+ to sun-exposed areas, reapply  every 2 hours as needed.  - Staying in the shade or wearing long sleeves, sun glasses (UVA+UVB protection) and wide brim hats (4-inch brim around the entire circumference of the hat) are also recommended. - Call for new or changing lesions.  Start September 1st 5-fluorouracil/calcipotriene cream twice a day for 7 days to affected areas including scalp. Prescription sent to [facility]. Patient provided with contact information for pharmacy and advised the pharmacy will mail the prescription to their home. Patient provided with handout reviewing treatment course and side effects and advised to call or message [place] on MyChart with any concerns.   Return in about 6 months (around 01/23/2022) for ak and isk follow up.  IRuthell Rummage, CMA, am acting as scribe for Sarina Ser, MD. Documentation: I have reviewed the above documentation for accuracy and completeness, and I agree with the above.  Sarina Ser, MD

## 2021-07-24 ENCOUNTER — Encounter: Payer: Self-pay | Admitting: Cardiovascular Disease

## 2021-07-29 ENCOUNTER — Other Ambulatory Visit: Payer: Self-pay | Admitting: Cardiology

## 2021-07-31 ENCOUNTER — Encounter: Payer: Self-pay | Admitting: Dermatology

## 2021-08-07 NOTE — Progress Notes (Unsigned)
Electrophysiology Office Follow up Visit Note:    Date:  08/08/2021   ID:  [name] [name], DOB Feb 22, 1940, MRN [id number]  PCP:  [name], [facility] Cardiologist:  Ida Rogue, MD  [facility] Electrophysiologist:  Vickie Epley, MD    Interval History:    [name] [name] is a 81 y.o. male who presents for a follow up visit. They were last seen in clinic Jun 06, 2021.  The patient had a biventricular ICD upgrade on February 26, 2021.  At the last appointment he reported doing well.  He has a history of ventricular tachycardia and had been electrically quiescent on amiodarone. The patient sent a message on July 24, 2021 inquiring about the possibility of stopping amiodarone given potential side effects.  Today we discussed sleep, liver enzyme elevation, intermittent phrenic stimulation.  Phrenic stimulation is rare and only occurs in certain positions or if he holds his diaphragm a certain way.     Past Medical History:  Diagnosis Date   Aortic insufficiency    a. noted on TTE 2015; b. 06/2019 Echo: AI not visualized.   Arthritis    CAD (coronary artery disease)    a. remote PCI in 1991 and 2005; b. MV 3/15: old inferior MI, no ischemia, LVEF 50%, slight inferior wall hypokniesis; c. 06/2019 NSTEMI/PCI: LM nl, LAD 80p/m (Atherectomy & 4.5x18 Resolute Onyx DES), 79md, D1 75 (PTCA), RI patent stent, LCX nl, RCA 100p, RPAV fills via L->R collats from LCX.   Chicken pox    Colon polyps    4 pre-cancerous    Diverticulitis    DM type 2 (diabetes mellitus, type 2) (HCC)    Family history of adverse reaction to anesthesia    GERD (gastroesophageal reflux disease)    Heart murmur    History of kidney stones    HOH (hard of hearing)    Hyperlipidemia    Hypertension    Ischemic cardiomyopathy    a. TTE 2015: EF  50-55%, mild global HK; b. 06/2019 Echo: EF 45-50%, Gr1 DD, basal inf AK. Triv MR.   Kidney stones    Melan[place]   Resected from his  back   Mitral regurgitation    a. noted on TTE 2015   Myocardial infarction ([facility]     Past Surgical History:  Procedure Laterality Date   BIV UPGRADE N/A 02/26/2021   Procedure: BIV ICD UPGRADE;  Surgeon: LVickie Epley MD;  [place] [facility];  Service: Cardiovascular;  Laterality: N/A;   CHOLECYSTECTOMY  2012   COLONOSCOPY     in 2003 with polyp removed and leak anastomosis had to have open abdominal surgery    [place]   CORONARY ATHERECTOMY N/A 07/08/2019   Procedure: CORONARY ATHERECTOMY;  Surgeon: VJettie Booze MD;  [place] [facility];  Service: Cardiovascular;  Laterality: N/A;   CORONARY BALLOON ANGIOPLASTY N/A 07/08/2019   Procedure: CORONARY BALLOON ANGIOPLASTY;  Surgeon: VJettie Booze MD;  [place] [facility];  Service: Cardiovascular;  Laterality: N/A;  diagonal    CORONARY STENT INTERVENTION N/A 07/08/2019   Procedure: CORONARY STENT INTERVENTION;  Surgeon: VJettie Booze MD;  [place];  Service: Cardiovascular;  Laterality: N/A;  lad   CYSTOSCOPY/URETEROSCOPY/HOLMIUM LASER/STENT PLACEMENT Right 01/02/2018   Procedure: CYSTOSCOPY/URETEROSCOPY/HOLMIUM LASER/STENT PLACEMENT;  Surgeon: SBilley Co MD;  Location: [facility];  Service: Urology;  Laterality: Right;   ESOPHAGOGASTRODUODENOSCOPY (  EGD) WITH PROPOFOL N/A 11/19/2016   Procedure: ESOPHAGOGASTRODUODENOSCOPY (EGD) WITH PROPOFOL;  Surgeon: [name], MD;  Location: [facility];  Service: Endoscopy;  Laterality: N/A;   ESOPHAGOGASTRODUODENOSCOPY (EGD) WITH PROPOFOL N/A 02/02/2019   Procedure: ESOPHAGOGASTRODUODENOSCOPY (EGD) WITH PROPOFOL;  Surgeon: [name], MD;  Location: [facility] ENDOSCOPY;  Service: Endoscopy;  Laterality: N/A;   ICD IMPLANT N/A 02/02/2020   Procedure: ICD IMPLANT;  Surgeon: Vickie Epley, MD;  Location: [place] [facility];  Service: Cardiovascular;  Laterality: N/A;    INTRAVASCULAR PRESSURE WIRE/FFR STUDY N/A 07/07/2019   Procedure: INTRAVASCULAR PRESSURE WIRE/FFR STUDY;  Surgeon: Nelva Bush, MD;  Location: [place] [facility];  Service: Cardiovascular;  Laterality: N/A;   INTRAVASCULAR ULTRASOUND/IVUS N/A 07/08/2019   Procedure: Intravascular Ultrasound/IVUS;  Surgeon: Jettie Booze, MD;  Location: [place] [facility];  Service: Cardiovascular;  Laterality: N/A;   LEAD REVISION/REPAIR N/A 02/16/2020   Procedure: LEAD REVISION/REPAIR;  Surgeon: Constance Haw, MD;  Location: [place] [facility];  Service: Cardiovascular;  Laterality: N/A;   LEFT HEART CATH N/A 07/08/2019   Procedure: Left Heart Cath;  Surgeon: Jettie Booze, MD;  Location: [place] [facility];  Service: Cardiovascular;  Laterality: N/A;   LEFT HEART CATH AND CORONARY ANGIOGRAPHY N/A 07/07/2019   Procedure: LEFT HEART CATH AND CORONARY ANGIOGRAPHY;  Surgeon: Nelva Bush, MD;  Location: [place] [facility];  Service: Cardiovascular;  Laterality: N/A;   LITHOTRIPSY  2015   MELANOMA EXCISION  1980   malignant   NERVE SURGERY  2015   ulna nerve   TONSILLECTOMY  1945   WISDOM TOOTH EXTRACTION      Current Medications: Current Meds  Medication Sig   amiodarone (PACERONE) 200 MG tablet Take 1 tablet (200 mg total) by mouth daily.   aspirin EC 81 MG tablet Take 1 tablet (81 mg total) by mouth daily. Swallow whole. (Patient taking differently: Take 81 mg by mouth daily with lunch. Swallow whole.)   bisacodyl (DULCOLAX) 5 MG EC tablet Take 5 mg by mouth daily as needed for moderate constipation.   carvedilol (COREG) 3.125 MG tablet Take 1 tablet (3.125 mg total) by mouth 2 (two) times daily with a meal.   clopidogrel (PLAVIX) 75 MG tablet TAKE 1 TABLET(75 MG) BY MOUTH DAILY   docusate sodium (COLACE) 100 MG capsule Take 100 mg by mouth in the morning, at noon, and at bedtime.   ezetimibe (ZETIA) 10 MG tablet TAKE 1 TABLET(10 MG) BY MOUTH DAILY   famotidine (PEPCID) 20 MG  tablet Take 20 mg by mouth daily with lunch.   fluorouracil (EFUDEX) 5 % cream Apply topically 2 (two) times daily. For 7 days at scalp   fluticasone (FLONASE) 50 MCG/ACT nasal spray Place into both nostrils daily as needed for allergies or rhinitis.   gabapentin (NEURONTIN) 100 MG capsule Take 1 capsule (100 mg total) by mouth 3 (three) times daily.   glipiZIDE (GLUCOTROL) 10 MG tablet Take glipizide '5mg'$  qam breakfast, and '10mg'$  qpm with supper.   lisinopril (ZESTRIL) 10 MG tablet TAKE 1 TABLET BY MOUTH DAILY   midodrine (PROAMATINE) 5 MG tablet Take 1 tablet (5 mg total) by mouth 2 (two) times daily as needed. Take when BP does drop less than 110 when standing. Additional dose at 2 pm if still dropping below 110 when standing   mometasone (ELOCON) 0.1 % cream Apply to affected areas at elbow twice daily up to 5 days a week as needed. Avoid applying to face, groin,  and axilla. Use as directed. Long-term use can cause thinning of the skin.   Multiple Vitamin (MULTIVITAMIN) tablet Take 1 tablet by mouth daily with lunch.   Omega-3 Fatty Acids (FISH OIL) 1000 MG CAPS Take 1,000 mg by mouth daily with lunch.   Omega-3 Fatty Acids (FISH OIL) 500 MG CAPS Take 500 mg by mouth daily.   pantoprazole (PROTONIX) 40 MG tablet Take 1 tablet (40 mg total) by mouth daily.   psyllium (METAMUCIL) 58.6 % powder Take 1 packet by mouth at bedtime.   rosuvastatin (CRESTOR) 10 MG tablet TAKE 1 TABLET(10 MG) BY MOUTH DAILY (Patient taking differently: Take 10 mg by mouth every other day.)   Semaglutide, 1 MG/DOSE, (OZEMPIC, 1 MG/DOSE,) 4 MG/3ML SOPN Inject 1 mg into the skin every Sunday.   sodium chloride (OCEAN) 0.65 % SOLN nasal spray Place 1 spray into both nostrils as needed for congestion.   spironolactone (ALDACTONE) 25 MG tablet TAKE 1 TABLET(25 MG) BY MOUTH DAILY   tamsulosin (FLOMAX) 0.4 MG CAPS capsule TAKE ONE CAPSULE BY MOUTH DAILY (Patient taking differently: Take 0.4 mg by mouth daily with lunch. TAKE ONE  CAPSULE BY MOUTH DAILY)   traMADol (ULTRAM) 50 MG tablet TAKE 1 TABLET BY MOUTH EVERY 12 HOURS AS NEEDED     Allergies:   Metformin and related, Azithromycin, Other, Percocet [oxycodone-acetaminophen], and Jardiance [empagliflozin]   Social History   Socioeconomic History   Marital status: Married    Spouse name: Golden Circle   Number of children: Not on file   Years of education: Not on file   Highest education level: Not on file  Occupational History   Not on file  Tobacco Use   Smoking status: Some Days    Types: Pipe   Smokeless tobacco: Former    Quit date: 10/17/2015  Vaping Use   Vaping Use: Never used  Substance and Sexual Activity   Alcohol use: Not Currently    Alcohol/week: 0.0 - 1.0 standard drinks of alcohol   Drug use: No   Sexual activity: Not on file  Other Topics Concern   Not on file  Social History Narrative   Married    Social Determinants of Health   Financial Resource Strain: Medium Risk (12/25/2020)   Overall Financial Resource Strain (CARDIA)    Difficulty of Paying Living Expenses: Somewhat hard  Food Insecurity: No Food Insecurity (10/12/2020)   Hunger Vital Sign    Worried About Running Out of Food in the Last Year: Never true    [place] in the Last Year: Never true  Transportation Needs: No Transportation Needs (10/12/2020)   PRAPARE - Hydrologist (Medical): No    Lack of Transportation (Non-Medical): No  Physical Activity: Insufficiently Active (10/12/2020)   Exercise Vital Sign    Days of Exercise per Week: 3 days    Minutes of Exercise per Session: 30 min  Stress: No Stress Concern Present (10/12/2020)   [place]    Feeling of Stress : Not at all  Social Connections: Unknown (10/12/2020)   Social Connection and Isolation Panel [NHANES]    Frequency of Communication with Friends and Family: Not on file    Frequency of Social Gatherings with  Friends and Family: Not on file    Attends Religious Services: Not on file    Active Member of Clubs or Organizations: Not on file    Attends Archivist Meetings: Not  on file    Marital Status: Married     Family History: The patient's family history includes Colon cancer in his father; Diabetes in his father and mother; Heart attack in his mother; Heart disease in his mother; Hyperlipidemia in his mother; Hypertension in his mother; Kidney cancer in his father; Liver cancer in his father; Lung cancer in his father. There is no history of Bladder Cancer or Prostate cancer.  ROS:   Please see the history of present illness.    All other systems reviewed and are negative.  EKGs/Labs/Other Studies Reviewed:    The following studies were reviewed today:  August 08, 2021 in clinic device interrogation personally reviewed Battery longevity okay Lead parameters stable No high-voltage therapies    Recent Labs: 02/21/2021: BUN 30; Creatinine, Ser 1.24; Potassium 4.8; Sodium 132 03/09/2021: Hemoglobin 13.9; Platelets 206.0 05/28/2021: ALT 50  Recent Lipid Panel    Component Value Date/Time   CHOL 122 04/06/2021 0909   TRIG 226.0 (H) 04/06/2021 0909   HDL 35.60 (L) 04/06/2021 0909   CHOLHDL 3 04/06/2021 0909   VLDL 45.2 (H) 04/06/2021 0909   LDLCALC 35 12/21/2019 0919   LDLDIRECT 59.0 04/06/2021 0909    Physical Exam:    VS:  BP 96/72   Pulse (!) 56   Ht '6\' 1"'$  (1.854 m)   Wt 208 lb (94.3 kg)   BMI 27.44 kg/m     Wt Readings from Last 3 Encounters:  08/08/21 208 lb (94.3 kg)  06/06/21 212 lb (96.2 kg)  05/23/21 209 lb 6.4 oz (95 kg)     GEN:  Well nourished, well developed in no acute distress HEENT: Normal NECK: No JVD; No carotid bruits LYMPHATICS: No lymphadenopathy CARDIAC: RRR, no murmurs, rubs, gallops.  ICD pocket well-healed RESPIRATORY:  Clear to auscultation without rales, wheezing or rhonchi  ABDOMEN: Soft, non-tender, non-distended MUSCULOSKELETAL:   No edema; No deformity  SKIN: Warm and dry NEUROLOGIC:  Alert and oriented x 3 PSYCHIATRIC:  Normal affect        ASSESSMENT:    1. Chronic systolic heart failure ([place])   2. [place] thrombus of left ventricle   3. VT (ventricular tachycardia) (HCC)   4. Carotid artery disease, unspecified laterality, unspecified type (HCC)   5. Cardiac resynchronization therapy defibrillator (CRT-D) in place    PLAN:    In order of problems listed above:    #Chronic systolic heart failure NYHA class II.  Warm and dry on exam.  Continue current medical therapy.  #History of VT #CRT-D in situ Device functioning appropriately.  Continue remote monitoring.  Continue amiodarone.  He should have repeat blood work drawn in 4 to 6 months. He is having very minimal phrenic stim only in certain positions.  I would not recommend changing any settings at this time.     Medication Adjustments/Labs and Tests Ordered: Current medicines are reviewed at length with the patient today.  Concerns regarding medicines are outlined above.  No orders of the defined types were placed in this encounter.  No orders of the defined types were placed in this encounter.    Signed, Lars Mage, MD, [facility], [place] 08/08/2021 10:37 AM    Electrophysiology [facility]

## 2021-08-08 ENCOUNTER — Ambulatory Visit (INDEPENDENT_AMBULATORY_CARE_PROVIDER_SITE_OTHER): Payer: Medicare Other | Admitting: Cardiology

## 2021-08-08 ENCOUNTER — Encounter: Payer: Self-pay | Admitting: Cardiology

## 2021-08-08 ENCOUNTER — Ambulatory Visit: Payer: Medicare Other | Admitting: Family

## 2021-08-08 VITALS — BP 96/72 | HR 56 | Ht 73.0 in | Wt 208.0 lb

## 2021-08-08 DIAGNOSIS — I472 Ventricular tachycardia, unspecified: Secondary | ICD-10-CM

## 2021-08-08 DIAGNOSIS — I5022 Chronic systolic (congestive) heart failure: Secondary | ICD-10-CM | POA: Diagnosis not present

## 2021-08-08 DIAGNOSIS — Z95810 Presence of automatic (implantable) cardiac defibrillator: Secondary | ICD-10-CM

## 2021-08-08 DIAGNOSIS — I779 Disorder of arteries and arterioles, unspecified: Secondary | ICD-10-CM

## 2021-08-08 DIAGNOSIS — I513 Intracardiac thrombosis, not elsewhere classified: Secondary | ICD-10-CM | POA: Diagnosis not present

## 2021-08-08 NOTE — Patient Instructions (Signed)
Medications: Your physician recommends that you continue on your current medications as directed. Please refer to the Current Medication list given to you today. *If you need a refill on your cardiac medications before your next appointment, please call your pharmacy*  Lab Work: None. If you have labs (blood work) drawn today and your tests are completely normal, you will receive your results only by: [place] (if you have MyChart) OR A paper copy in the mail If you have any lab test that is abnormal or we need to change your treatment, we will call you to review the results.  Testing/Procedures: None.  Follow-Up: At [facility], you and your health needs are our priority.  As part of our continuing mission to provide you with exceptional heart care, we have created designated Provider Care Teams.  These Care Teams include your primary Cardiologist (physician) and Advanced Practice Providers (APPs -  Physician Assistants and Nurse Practitioners) who all work together to provide you with the care you need, when you need it.  Your physician wants you to follow-up in: 4-6 months with one of the following Advanced Practice Providers on your designated Care Team:    Ignacia Bayley, NP Christell Faith PA Cadence Kathlen Mody PA  We recommend signing up for the patient portal called "MyChart".  Sign up information is provided on this After Visit Summary.  MyChart is used to connect with patients for Virtual Visits (Telemedicine).  Patients are able to view lab/test results, encounter notes, upcoming appointments, etc.  Non-urgent messages can be sent to your provider as well.   To learn more about what you can do with MyChart, go to [email or web address].    Any Other Special Instructions Will Be Listed Below (If Applicable).

## 2021-08-17 ENCOUNTER — Other Ambulatory Visit: Payer: Self-pay | Admitting: Cardiovascular Disease

## 2021-08-23 ENCOUNTER — Ambulatory Visit: Payer: Medicare Other | Admitting: Podiatry

## 2021-08-27 ENCOUNTER — Encounter: Payer: Self-pay | Admitting: Family

## 2021-08-27 ENCOUNTER — Ambulatory Visit (INDEPENDENT_AMBULATORY_CARE_PROVIDER_SITE_OTHER): Payer: Medicare Other | Admitting: Family

## 2021-08-27 VITALS — BP 120/78 | HR 65 | Temp 98.3°F | Ht 72.0 in | Wt 209.6 lb

## 2021-08-27 DIAGNOSIS — K5900 Constipation, unspecified: Secondary | ICD-10-CM | POA: Diagnosis not present

## 2021-08-27 DIAGNOSIS — G8929 Other chronic pain: Secondary | ICD-10-CM | POA: Diagnosis not present

## 2021-08-27 DIAGNOSIS — I10 Essential (primary) hypertension: Secondary | ICD-10-CM

## 2021-08-27 DIAGNOSIS — E118 Type 2 diabetes mellitus with unspecified complications: Secondary | ICD-10-CM | POA: Diagnosis not present

## 2021-08-27 DIAGNOSIS — I25118 Atherosclerotic heart disease of native coronary artery with other forms of angina pectoris: Secondary | ICD-10-CM

## 2021-08-27 DIAGNOSIS — M545 Low back pain, unspecified: Secondary | ICD-10-CM

## 2021-08-27 LAB — COMPREHENSIVE METABOLIC PANEL
ALT: 46 U/L (ref 0–53)
AST: 48 U/L — ABNORMAL HIGH (ref 0–37)
Albumin: 3.9 g/dL (ref 3.5–5.2)
Alkaline Phosphatase: 80 U/L (ref 39–117)
BUN: 24 mg/dL — ABNORMAL HIGH (ref 6–23)
CO2: 22 mEq/L (ref 19–32)
Calcium: 9.2 mg/dL (ref 8.4–10.5)
Chloride: 104 mEq/L (ref 96–112)
Creatinine, Ser: 0.95 mg/dL (ref 0.40–1.50)
GFR: 75.02 mL/min (ref >60.00)
Glucose, Bld: 188 mg/dL — ABNORMAL HIGH (ref 70–99)
Potassium: 4.3 mEq/L (ref 3.5–5.1)
Sodium: 137 mEq/L (ref 135–145)
Total Bilirubin: 1.1 mg/dL (ref 0.2–1.2)
Total Protein: 6.7 g/dL (ref 6.0–8.3)

## 2021-08-27 LAB — POCT GLYCOSYLATED HEMOGLOBIN (HGB A1C): Hemoglobin A1C: 6.5 % — AB (ref 4.0–5.6)

## 2021-08-27 MED ORDER — METFORMIN HCL ER 500 MG PO TB24: 500.0000 mg | ORAL_TABLET | Freq: Every evening | ORAL | 2 refills | Status: DC

## 2021-08-27 MED ORDER — ROSUVASTATIN CALCIUM 10 MG PO TABS: 10.0000 mg | ORAL_TABLET | ORAL | 1 refills | Status: DC

## 2021-08-27 MED ORDER — GABAPENTIN 100 MG PO CAPS: 100.0000 mg | ORAL_CAPSULE | Freq: Every day | ORAL | 3 refills | Status: DC | PRN

## 2021-08-27 NOTE — Assessment & Plan Note (Signed)
Chronic, stable.  Patient politely declines updating images for low back.  He feels back pain is unchanged.  Continue gabapentin 100 mg daily as needed.  He will continue tramadol 50 mg BID as needed

## 2021-08-27 NOTE — Assessment & Plan Note (Signed)
Lab Results  Component Value Date   HGBA1C 6.5 (A) 08/27/2021   Excellent control.  Discussed Ozempic 1 mg likely causing or exacerbating constipation.  Patient has been happy with Ozempic and politely declines decreasing/discontinuing at this time.  We have opted instead to trial restart metformin 500 mg and titrate due to his frustration to recent weight gain.  Previously metformin had caused diarrhea we discussed this may be helpful with current constipation.  Discontinue glipizide to avoid hypoglycemia.  Patient will let me know how he is doing

## 2021-08-27 NOTE — Patient Instructions (Addendum)
Increase fluids.  Increase metamucil to 3 times per day.   Let me know if you would like to decrease ozempic.   Start metformin '500mg'$  to help with appetite , loose weight.   Lets trial metformin  Start metformin XR with one '500mg'$  tablet at night. After one week, you may increase to two tablets at night ( total of '1000mg'$ ) . The third week, you may take take two tablets at night and one tablet in the morning.  The fourth week, you may take two tablets in the morning ( '1000mg'$  total) and two tablets at night ('1000mg'$  total). This will bring you to a maximum daily dose of '2000mg'$ /day which is maximum dose. Along the way, if you want to increase more slowly, please do as this medication can cause GI discomfort and loose stools which usually get better with time , however some patients find that they can only tolerate a certain dose and cannot increase to maximum dose.    Stop glipizide.

## 2021-08-27 NOTE — Assessment & Plan Note (Signed)
Concerned that Ozempic may be the culprit.  He has been managing with Colace 1 tablet daily.  Metamucil 1 tablespoon daily.  I advised him to increase Metamucil 3.4g PO to 3 times daily.  He will increase his water intake.  He will let me know if symptom not resolved.

## 2021-08-27 NOTE — Progress Notes (Signed)
Has been having constipation for last 2 months and wants to talk about Ozempic as well

## 2021-08-27 NOTE — Progress Notes (Signed)
Subjective:    Patient ID: [name] [name], male    DOB: 1940/06/07, 81 y.o.   MRN: [id number]  CC: [name] [name] is a 81 y.o. male who presents today for follow up.   HPI: Complains of constipation over the past couple of weeks.  He has a BM daily 'generally' however reports he may skip a day.  Describes hard, difficult to pass stool.  He is taking colace 1 tablet daily,  metamucil 1 tablespoon daily. He doesn't drink enough water.   He is frustrated by weight gain. He hasnt changed his diet.   He is walking daily which is helpful for BM.   No blood in stool,changes in stool diameter, abdominal pain.    DM-compliant with Ozempic 1 mg, glipizide 10 mg every morning, 5 mg with supper. Hyperlipidemia-compliant with Crestor 10 mg every other day due to elevation in hepatic enzymes. FBG 118, 135, 150.   Follow-up Dr Quentin Ore 08/08/2021.  Continued on amiodarone.  Low back pain-overall controlled and at baseline.  Occasional posterior leg numbness, pain which is not new. he takes gabapentin '100mg'$  qd prn with relief.  He has on occasion taken two tablets at the same time. He continues to take tramadol which is working well.  XR 10/22/2017 lumbar showed DDD. No groin pain.   HISTORY:  Past Medical History:  Diagnosis Date   Aortic insufficiency    a. noted on TTE 2015; b. 06/2019 Echo: AI not visualized.   Arthritis    CAD (coronary artery disease)    a. remote PCI in 1991 and 2005; b. MV 3/15: old inferior MI, no ischemia, LVEF 50%, slight inferior wall hypokniesis; c. 06/2019 NSTEMI/PCI: LM nl, LAD 80p/m (Atherectomy & 4.5x18 Resolute Onyx DES), 41md, D1 75 (PTCA), RI patent stent, LCX nl, RCA 100p, RPAV fills via L->R collats from LCX.   Chicken pox    Colon polyps    4 pre-cancerous    Diverticulitis    DM type 2 (diabetes mellitus, type 2) (HCC)    Family history of adverse reaction to anesthesia    GERD (gastroesophageal reflux disease)    Heart murmur    History of kidney  stones    HOH (hard of hearing)    Hyperlipidemia    Hypertension    Ischemic cardiomyopathy    a. TTE 2015: EF  50-55%, mild global HK; b. 06/2019 Echo: EF 45-50%, Gr1 DD, basal inf AK. Triv MR.   Kidney stones    Melanoma ([place] 1980   Resected from his back   Mitral regurgitation    a. noted on TTE 2015   Myocardial infarction ([facility]    Past Surgical History:  Procedure Laterality Date   BIV UPGRADE N/A 02/26/2021   Procedure: BIV ICD UPGRADE;  Surgeon: LVickie Epley MD;  [facility];  Service: Cardiovascular;  Laterality: N/A;   CHOLECYSTECTOMY  2012   COLONOSCOPY     in 2003 with polyp removed and leak anastomosis had to have open abdominal surgery    [place] 2005   CORONARY ATHERECTOMY N/A 07/08/2019   Procedure: CORONARY ATHERECTOMY;  Surgeon: VJettie Booze MD;  [facility];  Service: Cardiovascular;  Laterality: N/A;   CORONARY BALLOON ANGIOPLASTY N/A 07/08/2019   Procedure: CORONARY BALLOON ANGIOPLASTY;  Surgeon: VJettie Booze MD;  [place];  Service: Cardiovascular;  Laterality: N/A;  diagonal    CORONARY STENT INTERVENTION N/A 07/08/2019  Procedure: CORONARY STENT INTERVENTION;  Surgeon: Jettie Booze, MD;  Location: [place] [facility];  Service: Cardiovascular;  Laterality: N/A;  lad   CYSTOSCOPY/URETEROSCOPY/HOLMIUM LASER/STENT PLACEMENT Right 01/02/2018   Procedure: CYSTOSCOPY/URETEROSCOPY/HOLMIUM LASER/STENT PLACEMENT;  Surgeon: [name], MD;  Location: [facility];  Service: Urology;  Laterality: Right;   ESOPHAGOGASTRODUODENOSCOPY (EGD) WITH PROPOFOL N/A 11/19/2016   Procedure: ESOPHAGOGASTRODUODENOSCOPY (EGD) WITH PROPOFOL;  Surgeon: [name], MD;  Location: [facility];  Service: Endoscopy;  Laterality: N/A;   ESOPHAGOGASTRODUODENOSCOPY (EGD) WITH PROPOFOL N/A 02/02/2019   Procedure: ESOPHAGOGASTRODUODENOSCOPY (EGD) WITH PROPOFOL;   Surgeon: [name], MD;  Location: [facility];  Service: Endoscopy;  Laterality: N/A;   ICD IMPLANT N/A 02/02/2020   Procedure: ICD IMPLANT;  Surgeon: Vickie Epley, MD;  Location: [place] [facility];  Service: Cardiovascular;  Laterality: N/A;   INTRAVASCULAR PRESSURE WIRE/FFR STUDY N/A 07/07/2019   Procedure: INTRAVASCULAR PRESSURE WIRE/FFR STUDY;  Surgeon: Nelva Bush, MD;  Location: [place] [facility];  Service: Cardiovascular;  Laterality: N/A;   INTRAVASCULAR ULTRASOUND/IVUS N/A 07/08/2019   Procedure: Intravascular Ultrasound/IVUS;  Surgeon: Jettie Booze, MD;  Location: [place] [facility];  Service: Cardiovascular;  Laterality: N/A;   LEAD REVISION/REPAIR N/A 02/16/2020   Procedure: LEAD REVISION/REPAIR;  Surgeon: Constance Haw, MD;  Location: [place] [facility];  Service: Cardiovascular;  Laterality: N/A;   LEFT HEART CATH N/A 07/08/2019   Procedure: Left Heart Cath;  Surgeon: Jettie Booze, MD;  Location: [place] [facility];  Service: Cardiovascular;  Laterality: N/A;   LEFT HEART CATH AND CORONARY ANGIOGRAPHY N/A 07/07/2019   Procedure: LEFT HEART CATH AND CORONARY ANGIOGRAPHY;  Surgeon: Nelva Bush, MD;  Location: [place] [facility];  Service: Cardiovascular;  Laterality: N/A;   LITHOTRIPSY  2015   MELANOMA EXCISION  1980   malignant   NERVE SURGERY  2015   ulna nerve   TONSILLECTOMY  1945   WISDOM TOOTH EXTRACTION     Family History  Problem Relation Age of Onset   Hyperlipidemia Mother    Hypertension Mother    Heart disease Mother    Diabetes Mother    Heart attack Mother    Colon cancer Father        metasized to liver, adrenal, lungs   Lung cancer Father    Kidney cancer Father        malignant capsulated kidney tumor   Diabetes Father    Liver cancer Father    Bladder Cancer Neg Hx    Prostate cancer Neg Hx     Allergies: Metformin and related, Azithromycin, Other, Percocet [oxycodone-acetaminophen], and Jardiance  [empagliflozin] Current Outpatient Medications on File Prior to Visit  Medication Sig Dispense Refill   amiodarone (PACERONE) 200 MG tablet Take 1 tablet (200 mg total) by mouth daily. 90 tablet 1   aspirin EC 81 MG tablet Take 1 tablet (81 mg total) by mouth daily. Swallow whole. (Patient taking differently: Take 81 mg by mouth daily with lunch. Swallow whole.) 90 tablet 3   bisacodyl (DULCOLAX) 5 MG EC tablet Take 5 mg by mouth daily as needed for moderate constipation.     carvedilol (COREG) 3.125 MG tablet Take 1 tablet (3.125 mg total) by mouth 2 (two) times daily with a meal. 60 tablet 5   clopidogrel (PLAVIX) 75 MG tablet TAKE 1 TABLET(75 MG) BY MOUTH DAILY 30 tablet 3   docusate sodium (COLACE) 100 MG capsule Take 100 mg by mouth in the morning, at noon, and  at bedtime.     ezetimibe (ZETIA) 10 MG tablet TAKE 1 TABLET(10 MG) BY MOUTH DAILY 90 tablet 2   famotidine (PEPCID) 20 MG tablet Take 20 mg by mouth daily with lunch.     fluticasone (FLONASE) 50 MCG/ACT nasal spray Place into both nostrils daily as needed for allergies or rhinitis.     lisinopril (ZESTRIL) 10 MG tablet TAKE 1 TABLET BY MOUTH DAILY 90 tablet 1   midodrine (PROAMATINE) 5 MG tablet Take 1 tablet (5 mg total) by mouth 2 (two) times daily as needed. Take when BP does drop less than 110 when standing. Additional dose at 2 pm if still dropping below 110 when standing 90 tablet 3   Multiple Vitamin (MULTIVITAMIN) tablet Take 1 tablet by mouth daily with lunch.     Omega-3 Fatty Acids (FISH OIL) 1000 MG CAPS Take 1,000 mg by mouth daily with lunch.     Omega-3 Fatty Acids (FISH OIL) 500 MG CAPS Take 500 mg by mouth daily.     ONETOUCH VERIO test strip USE TO CHECK BLOOD SUGAR TWICE DAILY 100 strip 1   pantoprazole (PROTONIX) 40 MG tablet Take 1 tablet (40 mg total) by mouth daily. 90 tablet 1   psyllium (METAMUCIL) 58.6 % powder Take 1 packet by mouth at bedtime.     Semaglutide, 1 MG/DOSE, (OZEMPIC, 1 MG/DOSE,) 4 MG/3ML  SOPN Inject 1 mg into the skin every Sunday. 3 mL 0   sodium chloride (OCEAN) 0.65 % SOLN nasal spray Place 1 spray into both nostrils as needed for congestion.     spironolactone (ALDACTONE) 25 MG tablet TAKE 1 TABLET(25 MG) BY MOUTH DAILY 90 tablet 2   tamsulosin (FLOMAX) 0.4 MG CAPS capsule TAKE ONE CAPSULE BY MOUTH DAILY (Patient taking differently: Take 0.4 mg by mouth daily with lunch. TAKE ONE CAPSULE BY MOUTH DAILY) 90 capsule 3   traMADol (ULTRAM) 50 MG tablet TAKE 1 TABLET BY MOUTH EVERY 12 HOURS AS NEEDED 60 tablet 2   No current facility-administered medications on file prior to visit.    Social History   Tobacco Use   Smoking status: Some Days    Types: Pipe   Smokeless tobacco: Former    Quit date: 10/17/2015  Vaping Use   Vaping Use: Never used  Substance Use Topics   Alcohol use: Not Currently    Alcohol/week: 0.0 - 1.0 standard drinks of alcohol   Drug use: No    Review of Systems  Constitutional:  Negative for chills and fever.  Respiratory:  Negative for cough.   Cardiovascular:  Negative for chest pain and palpitations.  Gastrointestinal:  Negative for nausea and vomiting.      Objective:    BP 120/78 (BP Location: Left Arm, Patient Position: Sitting, Cuff Size: Normal)   Pulse 65   Temp 98.3 F (36.8 C) (Oral)   Ht 6' (1.829 m)   Wt 209 lb 9.6 oz (95.1 kg)   SpO2 96%   BMI 28.43 kg/m  BP Readings from Last 3 Encounters:  08/27/21 120/78  08/08/21 96/72  06/06/21 103/61   Wt Readings from Last 3 Encounters:  08/27/21 209 lb 9.6 oz (95.1 kg)  08/08/21 208 lb (94.3 kg)  06/06/21 212 lb (96.2 kg)    Physical Exam Vitals reviewed.  Constitutional:      Appearance: He is well-developed.  Cardiovascular:     Rate and Rhythm: Regular rhythm.     Heart sounds: Normal heart sounds.  Pulmonary:  Effort: Pulmonary effort is normal. No respiratory distress.     Breath sounds: Normal breath sounds. No wheezing, rhonchi or rales.  Skin:     General: Skin is warm and dry.  Neurological:     Mental Status: He is alert.  Psychiatric:        Speech: Speech normal.        Behavior: Behavior normal.        Assessment & Plan:   Problem List Items Addressed This Visit       Cardiovascular and Mediastinum   Atherosclerosis of native coronary artery with stable angina pectoris (HCC)   Relevant Medications   gabapentin (NEURONTIN) 100 MG capsule   rosuvastatin (CRESTOR) 10 MG tablet   Essential hypertension - Primary   Relevant Medications   rosuvastatin (CRESTOR) 10 MG tablet   Other Relevant Orders   POCT HgB A1C (Completed)   Comprehensive metabolic panel     Endocrine   Controlled type 2 diabetes mellitus with complication, without long-term current use of insulin (HCC)    Lab Results  Component Value Date   HGBA1C 6.5 (A) 08/27/2021  Excellent control.  Discussed Ozempic 1 mg likely causing or exacerbating constipation.  Patient has been happy with Ozempic and politely declines decreasing/discontinuing at this time.  We have opted instead to trial restart metformin 500 mg and titrate due to his frustration to recent weight gain.  Previously metformin had caused diarrhea we discussed this may be helpful with current constipation.  Discontinue glipizide to avoid hypoglycemia.  Patient will let me know how he is doing      Relevant Medications   metFORMIN (GLUCOPHAGE-XR) 500 MG 24 hr tablet   rosuvastatin (CRESTOR) 10 MG tablet     Other   Chronic low back pain    Chronic, stable.  Patient politely declines updating images for low back.  He feels back pain is unchanged.  Continue gabapentin 100 mg daily as needed.  He will continue tramadol 50 mg BID as needed      Relevant Medications   gabapentin (NEURONTIN) 100 MG capsule   Constipation    Concerned that Ozempic may be the culprit.  He has been managing with Colace 1 tablet daily.  Metamucil 1 tablespoon daily.  I advised him to increase Metamucil 3.4g PO to 3  times daily.  He will increase his water intake.  He will let me know if symptom not resolved.        I have discontinued [name] [name] "Ben"'s glipiZIDE, mometasone, and fluorouracil. I have also changed his gabapentin and rosuvastatin. Additionally, I am having him start on metFORMIN. Lastly, I am having him maintain his psyllium, sodium chloride, fluticasone, aspirin EC, midodrine, famotidine, multivitamin, Fish Oil, tamsulosin, OneTouch Verio, docusate sodium, carvedilol, spironolactone, ezetimibe, Ozempic (1 MG/DOSE), lisinopril, traMADol, pantoprazole, amiodarone, Fish Oil, bisacodyl, and clopidogrel.   Meds ordered this encounter  Medications   gabapentin (NEURONTIN) 100 MG capsule    Sig: Take 1 capsule (100 mg total) by mouth daily as needed.    Dispense:  90 capsule    Refill:  3    Order Specific Question:   Supervising Provider    Answer:   Deborra Medina L [[id number]]   metFORMIN (GLUCOPHAGE-XR) 500 MG 24 hr tablet    Sig: Take 1 tablet (500 mg total) by mouth every evening.    Dispense:  90 tablet    Refill:  2    Order Specific Question:   Supervising Provider  Answer:   Deborra Medina L [[id number]]   rosuvastatin (CRESTOR) 10 MG tablet    Sig: Take 1 tablet (10 mg total) by mouth every other day.    Dispense:  90 tablet    Refill:  1    Order Specific Question:   Supervising Provider    Answer:   Crecencio Mc [[id number]]    Return precautions given.   Risks, benefits, and alternatives of the medications and treatment plan prescribed today were discussed, and patient expressed understanding.   Education regarding symptom management and diagnosis given to patient on AVS.  Continue to follow with [name], [name] for routine health maintenance.   [name] [name] and I agreed with plan.   [name], [name]

## 2021-08-28 ENCOUNTER — Ambulatory Visit (INDEPENDENT_AMBULATORY_CARE_PROVIDER_SITE_OTHER): Payer: Medicare Other

## 2021-08-28 DIAGNOSIS — I255 Ischemic cardiomyopathy: Secondary | ICD-10-CM | POA: Diagnosis not present

## 2021-08-28 LAB — CUP PACEART REMOTE DEVICE CHECK
Battery Remaining Longevity: 132 mo
Battery Remaining Percentage: 100 %
Brady Statistic RA Percent Paced: 9 %
Brady Statistic RV Percent Paced: 97 %
Date Time Interrogation Session: 20230815002100
HighPow Impedance: 79 Ohm
Implantable Lead Implant Date: 20220202
Implantable Lead Implant Date: 20230213
Implantable Lead Implant Date: 20230213
Implantable Lead Location: 753858
Implantable Lead Location: 753859
Implantable Lead Location: 753860
Implantable Lead Model: 273
Implantable Lead Model: 4671
Implantable Lead Model: 7841
Implantable Lead Serial Number: 111255
Implantable Lead Serial Number: 1233258
Implantable Lead Serial Number: 861310
Implantable Pulse Generator Implant Date: 20230213
Lead Channel Impedance Value: 414 Ohm
Lead Channel Impedance Value: 569 Ohm
Lead Channel Impedance Value: 615 Ohm
Lead Channel Pacing Threshold Amplitude: 0.8 V
Lead Channel Pacing Threshold Pulse Width: 0.4 ms
Lead Channel Setting Pacing Amplitude: 1.5 V
Lead Channel Setting Pacing Amplitude: 2 V
Lead Channel Setting Pacing Amplitude: 2 V
Lead Channel Setting Pacing Pulse Width: 0.4 ms
Lead Channel Setting Pacing Pulse Width: 1 ms
Lead Channel Setting Sensing Sensitivity: 0.6 mV
Lead Channel Setting Sensing Sensitivity: 1 mV
Pulse Gen Serial Number: 279753

## 2021-08-30 ENCOUNTER — Encounter: Payer: Self-pay | Admitting: Urology

## 2021-08-30 ENCOUNTER — Ambulatory Visit: Payer: Medicare Other | Admitting: Urology

## 2021-08-30 ENCOUNTER — Ambulatory Visit: Payer: Medicare Other | Admitting: Podiatry

## 2021-08-30 ENCOUNTER — Encounter: Payer: Self-pay | Admitting: Podiatry

## 2021-08-30 VITALS — BP 99/64 | HR 62 | Ht 72.0 in | Wt 203.0 lb

## 2021-08-30 DIAGNOSIS — N138 Other obstructive and reflux uropathy: Secondary | ICD-10-CM

## 2021-08-30 DIAGNOSIS — N281 Cyst of kidney, acquired: Secondary | ICD-10-CM

## 2021-08-30 DIAGNOSIS — B351 Tinea unguium: Secondary | ICD-10-CM

## 2021-08-30 DIAGNOSIS — N200 Calculus of kidney: Secondary | ICD-10-CM

## 2021-08-30 DIAGNOSIS — N401 Enlarged prostate with lower urinary tract symptoms: Secondary | ICD-10-CM | POA: Diagnosis not present

## 2021-08-30 DIAGNOSIS — M79675 Pain in left toe(s): Secondary | ICD-10-CM

## 2021-08-30 DIAGNOSIS — M79674 Pain in right toe(s): Secondary | ICD-10-CM

## 2021-08-30 DIAGNOSIS — E119 Type 2 diabetes mellitus without complications: Secondary | ICD-10-CM

## 2021-08-30 MED ORDER — TAMSULOSIN HCL 0.4 MG PO CAPS: ORAL_CAPSULE | ORAL | 3 refills | Status: DC

## 2021-08-30 NOTE — Progress Notes (Signed)
This patient returns to my office for at risk foot care.  This patient requires this care by a professional since this patient will be at risk due to having diabetes,He had seen Dr.  McDonald for previous nail care.  This patient is unable to cut nails himself since the patient cannot reach his nails.These nails are painful walking and wearing shoes.  This patient presents for at risk foot care today.  General Appearance  Alert, conversant and in no acute stress.  Vascular  Dorsalis pedis and posterior tibial  pulses are palpable  bilaterally.  Capillary return is within normal limits  bilaterally. Temperature is within normal limits  bilaterally.  Neurologic  Senn-Weinstein monofilament wire test diminished  bilaterally. Muscle power within normal limits bilaterally.  Nails Thick disfigured discolored nails with subungual debris  from hallux to fifth toes bilaterally. No evidence of bacterial infection or drainage bilaterally. Marked curvature medial border right hallux.  Orthopedic  No limitations of motion  feet .  No crepitus or effusions noted.  No bony pathology or digital deformities noted. HAV  B/L  Skin  normotropic skin with no porokeratosis noted bilaterally.  No signs of infections or ulcers noted.     Onychomycosis  Pain in right toes  Pain in left toes  Consent was obtained for treatment procedures.   Mechanical debridement of nails 1-5  bilaterally performed with a nail nipper.  Filed with dremel without incident.    Return office visit    3 months                  Told patient to return for periodic foot care and evaluation due to potential at risk complications.   Briton Sellman DPM   

## 2021-08-30 NOTE — Progress Notes (Signed)
   08/30/2021 1:51 PM   [name] [name] 12-Aug-1940 [id number]  Reason for visit: Follow up nephrolithiasis, BPH, renal cyst, adrenal nodule  HPI: I saw [name] for the above issues.  He has a long history of BPH and is on Flomax.  His urinary symptoms are stable, and he occasionally feels like he has to urinate a second time immediately after his first void to empty completely.  He is minimally bothered by this.  He denies any gross hematuria or UTIs.  PVR is mildly elevated today at 175 mL which is around his baseline.  Primary issue is nocturia about every 2 hours overnight, but he is drinking a large glass of water with Metamucil before bed.  Continue Flomax, we discussed outlet procedures briefly, and he is not interested at this time.   In terms of his renal cysts, we had been following a ~1.5 centimeters right Bosniak 47F cyst that had been stable for many years on MRI.  I personally viewed and interpreted the most recent CT dated 03/18/2021 that shows stable renal cysts.  The right-sided adrenal adenoma was stable at 1.3 cm and was consistent with a benign adenoma.  I previously had offered endocrine evaluation but he deferred.  He would like to continue yearly ultrasound surveillance for his stable renal cysts previously evaluated with MRI and CT.  Regarding nephrolithiasis, he underwent ureteroscopy with me in December 2019 on the right side.  He denies any stones over the last year.  On CT he has significant bilateral nonobstructive renal stone burden, but no hydronephrosis.  We discussed options including surveillance or ureteroscopy, and he would like to continue with surveillance.  -Flomax refilled -RTC 1 year PVR, symptom check.  He would like to continue yearly renal ultrasounds regarding his renal cysts   [name], [place] [address], [place], [place] [address] [phone]

## 2021-09-07 ENCOUNTER — Other Ambulatory Visit: Payer: Self-pay | Admitting: Family

## 2021-09-07 ENCOUNTER — Encounter: Payer: Self-pay | Admitting: Family

## 2021-09-07 DIAGNOSIS — G8929 Other chronic pain: Secondary | ICD-10-CM

## 2021-09-28 ENCOUNTER — Ambulatory Visit (INDEPENDENT_AMBULATORY_CARE_PROVIDER_SITE_OTHER): Payer: Medicare Other

## 2021-09-28 DIAGNOSIS — Z23 Encounter for immunization: Secondary | ICD-10-CM | POA: Diagnosis not present

## 2021-09-28 NOTE — Progress Notes (Signed)
Remote ICD transmission.   

## 2021-10-02 ENCOUNTER — Telehealth: Payer: Self-pay

## 2021-10-02 ENCOUNTER — Other Ambulatory Visit: Payer: Self-pay | Admitting: Family

## 2021-10-02 NOTE — Telephone Encounter (Signed)
Called patient and informed him that his Ozempic from patient assistance was here and he could pick it up anytime between 1pm and 5pm M-F

## 2021-10-02 NOTE — Telephone Encounter (Signed)
Patient came into office and picked up his Ozempic '1mg'$   (4 boxes) from patient assistance at 3:30pm

## 2021-10-06 ENCOUNTER — Other Ambulatory Visit: Payer: Self-pay | Admitting: Family

## 2021-10-06 DIAGNOSIS — G8929 Other chronic pain: Secondary | ICD-10-CM

## 2021-10-09 ENCOUNTER — Other Ambulatory Visit: Payer: Self-pay | Admitting: Family

## 2021-10-09 DIAGNOSIS — G8929 Other chronic pain: Secondary | ICD-10-CM

## 2021-10-15 NOTE — Progress Notes (Signed)
Date:  10/16/2021   ID:  [name] [name], DOB March 09, 1940, MRN [id number]  Patient Location:  [address] [place] [address]   Provider location:   [facility], [place] office  PCP:  [name], [name]  Cardiologist:  Arvid Right [facility]  Chief Complaint  Patient presents with   6 month follow up     "Doing well." Medications reviewed by the patient verbally.     History of Present Illness:    [name] [name] is a 81 y.o. male  past medical history of CAD ,NSTEMI,  stenting was 54, and  2005 , 4/21, 6/05 May 2019 :atherectomy, drug-eluting stent  STENT RESOLUTE ONYX 4.5X18, postdilated LAD and angioplasty of diagonal VT followed by EP DM II HTN Hyperlipidemia Melanoma Abdominal aorta atherosclerosis ICD 01/2020 for EF 35% to 40% Ef in 6/23: ef 55 to 60% Who presents for routine follow-up of his coronary artery disease  Last seen by myself in clinic January 2023 In general reports that he feels well with no complaints PVCs last 2 mornings "irregular" heartbeat when monitoring but was asymptomatic Recent stress, 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 The  left ventricle has normal function. The left ventricle has no regional  wall motion abnormalities. Left ventricular diastolic parameters are  consistent with Grade I diastolic  dysfunction (impaired relaxation).   2. Right ventricular systolic function is normal. The right ventricular  size is normal.   3. The mitral valve is normal in structure. No evidence of mitral valve  regurgitation.   4. The aortic valve is tricuspid. Aortic valve regurgitation is not  visualized. Mild to moderate aortic valve stenosis.  Aortic valve mean  gradient measures 18.0 mmHg.  EKG personally reviewed by myself on todays visit NSR atrial sensed V paced rate 62 bpm  Other past medical history reviewed CT ABD/pelvis 07/11/2020 Mild diffuse aortic athero Small pericardial effusion, anterior  Followed by Dr. Quentin Ore, No sx of VT  Cath 06/2019:  Catheterization chronically occluded RCA collaterals from left to right, Severe proximal LAD disease 80%, stent placed. Underwent angioplasty of diagonal vessel, 75% lesion Patent stent in the ramus  Echo 12/2019 Left ventricular ejection fraction, by estimation, is 35 to 40%._0 ICD implant 02/02/2020  RV lead thresholds ahd risen significantly, was decided to pursue lead revision with suspect microdislodgement.  Echocardiogram December 2021 Ejection fraction 35 to 40%  Dr. Quentin Ore noted his eliquis 2/2 LV thrombus that had since resolved and discontinued it.  Non-STEMI June 2021: Hospital Catheterization chronically occluded RCA collaterals from left to right Severe proximal LAD disease 80%, stent placed Angioplasty of diagonal vessel, 75% lesion  ZIO monitor with nonsustained VT, PVCs 10% burden  Cardiac MRI Ejection fraction 35%  LGE in the basal and mid inferior and inferior lateral walls, dyskinesis of the basal and inferior inferolateral walls and a left ventricular mural thrombus located on the inferolateral wall.   Past Medical History:  Diagnosis Date   Aortic insufficiency    a. noted on TTE 2015; b. 06/2019 Echo: AI not visualized.   Arthritis    CAD (coronary artery disease)    a. remote PCI in 1991 and 2005; b. MV 3/15: old inferior MI, no ischemia,  LVEF 50%, slight inferior wall hypokniesis; c. 06/2019 NSTEMI/PCI: LM nl, LAD 80p/m (Atherectomy & 4.5x18 Resolute Onyx DES), 65md, D1 75 (PTCA), RI patent stent, LCX nl, RCA 100p, RPAV fills via L->R collats from LCX.   Chicken pox    Colon polyps    4 pre-cancerous    Diverticulitis    DM type 2  (diabetes mellitus, type 2) (HCC)    Family history of adverse reaction to anesthesia    GERD (gastroesophageal reflux disease)    Heart murmur    History of kidney stones    HOH (hard of hearing)    Hyperlipidemia    Hypertension    Ischemic cardiomyopathy    a. TTE 2015: EF  50-55%, mild global HK; b. 06/2019 Echo: EF 45-50%, Gr1 DD, basal inf AK. Triv MR.   Kidney stones    [place]   Resected from his back   Mitral regurgitation    a. noted on TTE 2015   Myocardial infarction ([facility]    Past Surgical History:  Procedure Laterality Date   BIV UPGRADE N/A 02/26/2021   Procedure: BIV ICD UPGRADE;  Surgeon: LVickie Epley MD;  [facility];  Service: Cardiovascular;  Laterality: N/A;   CHOLECYSTECTOMY  2012   COLONOSCOPY     in 2003 with polyp removed and leak anastomosis had to have open abdominal surgery    [place] 2005   CORONARY ATHERECTOMY N/A 07/08/2019   Procedure: CORONARY ATHERECTOMY;  Surgeon: VJettie Booze MD;  [place] [facility];  Service: Cardiovascular;  Laterality: N/A;   CORONARY BALLOON ANGIOPLASTY N/A 07/08/2019   Procedure: CORONARY BALLOON ANGIOPLASTY;  Surgeon: VJettie Booze MD;  [facility];  Service: Cardiovascular;  Laterality: N/A;  diagonal    CORONARY STENT INTERVENTION N/A 07/08/2019   Procedure: CORONARY STENT INTERVENTION;  Surgeon: VJettie Booze MD;  [facility];  Service: Cardiovascular;  Laterality: N/A;  lad   CYSTOSCOPY/URETEROSCOPY/HOLMIUM LASER/STENT PLACEMENT Right 01/02/2018   Procedure: CYSTOSCOPY/URETEROSCOPY/HOLMIUM LASER/STENT PLACEMENT;  Surgeon: SBilley Co MD;  Location: [facility];  Service: Urology;  Laterality: Right;   ESOPHAGOGASTRODUODENOSCOPY (EGD) WITH PROPOFOL N/A 11/19/2016   Procedure: ESOPHAGOGASTRODUODENOSCOPY (EGD) WITH PROPOFOL;  Surgeon: WLucilla Lame MD;  Location: [facility];   Service: Endoscopy;  Laterality: N/A;   ESOPHAGOGASTRODUODENOSCOPY (EGD) WITH PROPOFOL N/A 02/02/2019   Procedure: ESOPHAGOGASTRODUODENOSCOPY (EGD) WITH PROPOFOL;  Surgeon: WLucilla Lame MD;  Location: [facility];  Service: Endoscopy;  Laterality: N/A;   ICD IMPLANT N/A 02/02/2020   Procedure: ICD IMPLANT;  Surgeon: LVickie Epley MD;  [facility];  Service: Cardiovascular;  Laterality: N/A;   INTRAVASCULAR PRESSURE WIRE/FFR STUDY N/A 07/07/2019   Procedure: INTRAVASCULAR PRESSURE WIRE/FFR STUDY;  Surgeon: ENelva Bush MD;  [facility];  Service: Cardiovascular;  Laterality: N/A;   INTRAVASCULAR ULTRASOUND/IVUS N/A 07/08/2019   Procedure: Intravascular Ultrasound/IVUS;  Surgeon: VJettie Booze MD;  [facility];  Service: Cardiovascular;  Laterality: N/A;   LEAD REVISION/REPAIR N/A 02/16/2020   Procedure: LEAD REVISION/REPAIR;  Surgeon: CConstance Haw MD;  [place];  Service: Cardiovascular;  Laterality: N/A;   LEFT HEART CATH N/A 07/08/2019   Procedure: Left Heart Cath;  Surgeon: VJettie Booze MD;  [place];  Service: Cardiovascular;  Laterality: N/A;   LEFT HEART CATH AND CORONARY ANGIOGRAPHY N/A 07/07/2019   Procedure:  LEFT HEART CATH AND CORONARY ANGIOGRAPHY;  Surgeon: Nelva Bush, MD;  [facility] [place] [facility];  Service: Cardiovascular;  Laterality: N/A;   LITHOTRIPSY  2015   MELANOMA EXCISION  1980   malignant   NERVE SURGERY  2015   ulna nerve   TONSILLECTOMY  1945   WISDOM TOOTH EXTRACTION       Current Meds  Medication Sig   amiodarone (PACERONE) 200 MG tablet Take 1 tablet (200 mg total) by mouth daily.   aspirin EC 81 MG tablet Take 1 tablet (81 mg total) by mouth daily. Swallow whole. (Patient taking differently: Take 81 mg by mouth daily with lunch. Swallow whole.)   bisacodyl (DULCOLAX) 5 MG EC tablet Take 5 mg by mouth daily as needed for  moderate constipation.   carvedilol (COREG) 3.125 MG tablet Take 1 tablet (3.125 mg total) by mouth 2 (two) times daily with a meal.   clopidogrel (PLAVIX) 75 MG tablet TAKE 1 TABLET(75 MG) BY MOUTH DAILY   docusate sodium (COLACE) 100 MG capsule Take 100 mg by mouth in the morning, at noon, and at bedtime.   ezetimibe (ZETIA) 10 MG tablet TAKE 1 TABLET(10 MG) BY MOUTH DAILY   famotidine (PEPCID) 20 MG tablet Take 20 mg by mouth daily with lunch.   fluticasone (FLONASE) 50 MCG/ACT nasal spray Place into both nostrils daily as needed for allergies or rhinitis.   gabapentin (NEURONTIN) 100 MG capsule Take 1 capsule (100 mg total) by mouth daily as needed.   lisinopril (ZESTRIL) 10 MG tablet TAKE 1 TABLET BY MOUTH DAILY   metFORMIN (GLUCOPHAGE-XR) 500 MG 24 hr tablet Take 1 tablet (500 mg total) by mouth every evening.   midodrine (PROAMATINE) 5 MG tablet Take 1 tablet (5 mg total) by mouth 2 (two) times daily as needed. Take when BP does drop less than 110 when standing. Additional dose at 2 pm if still dropping below 110 when standing   Multiple Vitamin (MULTIVITAMIN) tablet Take 1 tablet by mouth daily with lunch.   Omega-3 Fatty Acids (FISH OIL) 1000 MG CAPS Take 1,000 mg by mouth daily with lunch.   Omega-3 Fatty Acids (FISH OIL) 500 MG CAPS Take 500 mg by mouth daily.   ONETOUCH VERIO test strip USE TO CHECK BLOOD SUGAR TWICE DAILY   pantoprazole (PROTONIX) 40 MG tablet Take 1 tablet (40 mg total) by mouth daily.   psyllium (METAMUCIL) 58.6 % powder Take 1 packet by mouth at bedtime.   rosuvastatin (CRESTOR) 10 MG tablet Take 1 tablet (10 mg total) by mouth every other day.   Semaglutide, 1 MG/DOSE, (OZEMPIC, 1 MG/DOSE,) 4 MG/3ML SOPN Inject 1 mg into the skin every Sunday.   sodium chloride (OCEAN) 0.65 % SOLN nasal spray Place 1 spray into both nostrils as needed for congestion.   spironolactone (ALDACTONE) 25 MG tablet TAKE 1 TABLET(25 MG) BY MOUTH DAILY   tamsulosin (FLOMAX) 0.4 MG CAPS  capsule TAKE ONE CAPSULE BY MOUTH DAILY   traMADol (ULTRAM) 50 MG tablet TAKE 1 TABLET BY MOUTH EVERY 12 HOURS AS NEEDED     Allergies:   Metformin and related, Azithromycin, Other, Percocet [oxycodone-acetaminophen], and Jardiance [empagliflozin]   Social History   Tobacco Use   Smoking status: Some Days    Types: Pipe    Passive exposure: Current   Smokeless tobacco: Former    Quit date: 10/17/2015  Vaping Use   Vaping Use: Never used  Substance Use Topics   Alcohol use: Yes  Alcohol/week: 0.0 - 1.0 standard drinks of alcohol    Comment: OCCASIONALLY   Drug use: No     Family Hx: The patient's family history includes Colon cancer in his father; Diabetes in his father and mother; Heart attack in his mother; Heart disease in his mother; Hyperlipidemia in his mother; Hypertension in his mother; Kidney cancer in his father; Liver cancer in his father; Lung cancer in his father. There is no history of Bladder Cancer or Prostate cancer.  ROS:   Please see the history of present illness.    Review of Systems  Constitutional: Negative.   HENT: Negative.    Respiratory: Negative.    Cardiovascular: Negative.   Gastrointestinal: Negative.   Neurological: Negative.   Psychiatric/Behavioral: Negative.    All other systems reviewed and are negative.   Labs/Other Tests and Data Reviewed:    Recent Labs: 03/09/2021: Hemoglobin 13.9; Platelets 206.0 08/27/2021: ALT 46; BUN 24; Creatinine, Ser 0.95; Potassium 4.3; Sodium 137   Recent Lipid Panel Lab Results  Component Value Date/Time   CHOL 122 04/06/2021 09:09 AM   TRIG 226.0 (H) 04/06/2021 09:09 AM   HDL 35.60 (L) 04/06/2021 09:09 AM   CHOLHDL 3 04/06/2021 09:09 AM   LDLCALC 35 12/21/2019 09:19 AM   LDLDIRECT 59.0 04/06/2021 09:09 AM    Wt Readings from Last 3 Encounters:  10/16/21 212 lb (96.2 kg)  08/30/21 203 lb (92.1 kg)  08/27/21 209 lb 9.6 oz (95.1 kg)     Exam:    BP 110/62 (BP Location: Left Arm, Patient  Position: Sitting, Cuff Size: Normal)   Pulse 62   Ht 6' (1.829 m)   Wt 212 lb (96.2 kg)   BMI 28.75 kg/m   Constitutional:  oriented to person, place, and time. No distress.  HENT:  [name]: Grossly normal Eyes:  no discharge. No scleral icterus.  Neck: No JVD, no carotid bruits  Cardiovascular: Regular rate and rhythm, no murmurs appreciated Pulmonary/Chest: Clear to auscultation bilaterally, no wheezes or rails Abdominal: Soft.  no distension.  no tenderness.  Musculoskeletal: Normal range of motion Neurological:  normal muscle tone. Coordination normal. No atrophy Skin: Skin warm and dry Psychiatric: normal affect, pleasant  ASSESSMENT & PLAN:    Sustained VT Monitored through ICD downloads, no recent events Reports some asymptomatic PVCs Recommend he continue on amio and coreg  Atherosclerosis of native coronary artery with stable angina pectoris, unspecified whether native or transplanted heart [facility]) Prior history of cardiomyopathy ejection fraction 35 to 40% December 2022 Normalized ejection fraction June 2023 on echo Continue carvedilol spironolactone and lisinopril Low blood pressure by history limiting further titration of medications  Diarrhea:  Periodic symptoms  Cardiomyopathy, ischemic Coreg, lisinopril, spironolactone EF 35 to 40% now normalized EF of 55 to 60% Appears euvolemic  Nonsustained VT, PVCs Has  ICD, followed EP Lead revision successful sustained VT requiring antitachycardia pacing No significant arrhythmia on device download  Atherosclerosis of abdominal aorta (HCC)  mild disease, no claudication sx Cholesterol at goal  Type 2 diabetes mellitus with other circulatory complication, without long-term current use of insulin (HCC) Stable, off Ozempic secondary to GI issues A1c well controlled  Carotid artery disease, unspecified laterality ([place]) U/s in 2017 Minor carotid atherosclerosis.  No further work-up needed   Total encounter time  more than 30 minutes  Greater than 50% was spent in counseling and coordination of care with the patient    Signed, Ida Rogue, MD  10/16/2021 12:37 PM    [facility]  [place] Office [address] [place] [address], [place], [place] [address]

## 2021-10-16 ENCOUNTER — Encounter: Payer: Self-pay | Admitting: Gastroenterology

## 2021-10-16 ENCOUNTER — Ambulatory Visit (INDEPENDENT_AMBULATORY_CARE_PROVIDER_SITE_OTHER): Payer: Medicare Other | Admitting: Gastroenterology

## 2021-10-16 ENCOUNTER — Encounter: Payer: Self-pay | Admitting: Cardiovascular Disease

## 2021-10-16 ENCOUNTER — Ambulatory Visit: Payer: Medicare Other | Attending: Cardiovascular Disease | Admitting: Cardiovascular Disease

## 2021-10-16 VITALS — BP 110/62 | HR 62 | Ht 72.0 in | Wt 212.0 lb

## 2021-10-16 VITALS — BP 113/74 | HR 61 | Temp 98.0°F | Wt 213.0 lb

## 2021-10-16 DIAGNOSIS — K5900 Constipation, unspecified: Secondary | ICD-10-CM | POA: Diagnosis not present

## 2021-10-16 DIAGNOSIS — Z95810 Presence of automatic (implantable) cardiac defibrillator: Secondary | ICD-10-CM

## 2021-10-16 DIAGNOSIS — I472 Ventricular tachycardia, unspecified: Secondary | ICD-10-CM

## 2021-10-16 DIAGNOSIS — K219 Gastro-esophageal reflux disease without esophagitis: Secondary | ICD-10-CM

## 2021-10-16 DIAGNOSIS — I779 Disorder of arteries and arterioles, unspecified: Secondary | ICD-10-CM

## 2021-10-16 DIAGNOSIS — I255 Ischemic cardiomyopathy: Secondary | ICD-10-CM

## 2021-10-16 DIAGNOSIS — I5022 Chronic systolic (congestive) heart failure: Secondary | ICD-10-CM

## 2021-10-16 DIAGNOSIS — I442 Atrioventricular block, complete: Secondary | ICD-10-CM | POA: Diagnosis not present

## 2021-10-16 DIAGNOSIS — I25118 Atherosclerotic heart disease of native coronary artery with other forms of angina pectoris: Secondary | ICD-10-CM

## 2021-10-16 DIAGNOSIS — I513 Intracardiac thrombosis, not elsewhere classified: Secondary | ICD-10-CM | POA: Diagnosis not present

## 2021-10-16 MED ORDER — PANTOPRAZOLE SODIUM 40 MG PO TBEC: 40.0000 mg | DELAYED_RELEASE_TABLET | Freq: Every day | ORAL | 3 refills | Status: DC

## 2021-10-16 NOTE — Patient Instructions (Signed)
Medication Instructions:  No changes  If you need a refill on your cardiac medications before your next appointment, please call your pharmacy.   Lab work: No new labs needed  Testing/Procedures: No new testing needed  Follow-Up: At [facility], you and your health needs are our priority.  As part of our continuing mission to provide you with exceptional heart care, we have created designated Provider Care Teams.  These Care Teams include your primary Cardiologist (physician) and Advanced Practice Providers (APPs -  Physician Assistants and Nurse Practitioners) who all work together to provide you with the care you need, when you need it.  You will need a follow up appointment in 12 months  Providers on your designated Care Team:   Christopher Berge, NP Ryan Dunn, PA-C Cadence Furth, PA-C  COVID-19 Vaccine Information can be found at: [email or web address] For questions related to vaccine distribution or appointments, please email [email or web address] or call [phone].   

## 2021-10-16 NOTE — Progress Notes (Signed)
Primary Care Physician: [name], [name]  Primary Gastroenterologist:  Dr. [name]  Chief Complaint  Patient presents with   Follow-up   Gastroesophageal Reflux   Medication Refill    HPI: [name] [name] is a 81 y.o. male here with a history of heartburn.  The patient is well controlled on pantoprazole.  He denies any dysphagia or unexplained weight loss.  He does report that he has been having constipation for years that has gotten somewhat worse.  The patient was recommended in his last colonoscopy to have a repeat colonoscopy in 3 years which was a few years ago.  The patient states that he takes a stool softener in the morning and at night and this keeps him going to the bathroom daily.  There is no report of any significant change in his bowel habits.  Past Medical History:  Diagnosis Date   Aortic insufficiency    a. noted on TTE 2015; b. 06/2019 Echo: AI not visualized.   Arthritis    CAD (coronary artery disease)    a. remote PCI in 1991 and 2005; b. MV 3/15: old inferior MI, no ischemia, LVEF 50%, slight inferior wall hypokniesis; c. 06/2019 NSTEMI/PCI: LM nl, LAD 80p/m (Atherectomy & 4.5x18 Resolute Onyx DES), 26md, D1 75 (PTCA), RI patent stent, LCX nl, RCA 100p, RPAV fills via L->R collats from LCX.   Chicken pox    Colon polyps    4 pre-cancerous    Diverticulitis    DM type 2 (diabetes mellitus, type 2) (HCC)    Family history of adverse reaction to anesthesia    GERD (gastroesophageal reflux disease)    Heart murmur    History of kidney stones    HOH (hard of hearing)    Hyperlipidemia    Hypertension    Ischemic cardiomyopathy    a. TTE 2015: EF  50-55%, mild global HK; b. 06/2019 Echo: EF 45-50%, Gr1 DD, basal inf AK. Triv MR.   Kidney stones    [place]an[place]   Resected from his back   Mitral regurgitation    a. noted on TTE 2015   Myocardial infarction ([facility]     Current Outpatient Medications  Medication Sig Dispense Refill    amiodarone (PACERONE) 200 MG tablet Take 1 tablet (200 mg total) by mouth daily. 90 tablet 1   aspirin EC 81 MG tablet Take 1 tablet (81 mg total) by mouth daily. Swallow whole. (Patient taking differently: Take 81 mg by mouth daily with lunch. Swallow whole.) 90 tablet 3   bisacodyl (DULCOLAX) 5 MG EC tablet Take 5 mg by mouth daily as needed for moderate constipation.     carvedilol (COREG) 3.125 MG tablet Take 1 tablet (3.125 mg total) by mouth 2 (two) times daily with a meal. 60 tablet 5   clopidogrel (PLAVIX) 75 MG tablet TAKE 1 TABLET(75 MG) BY MOUTH DAILY 30 tablet 3   docusate sodium (COLACE) 100 MG capsule Take 100 mg by mouth in the morning, at noon, and at bedtime.     ezetimibe (ZETIA) 10 MG tablet TAKE 1 TABLET(10 MG) BY MOUTH DAILY 90 tablet 2   famotidine (PEPCID) 20 MG tablet Take 20 mg by mouth daily with lunch.     fluticasone (FLONASE) 50 MCG/ACT nasal spray Place into both nostrils daily as needed for allergies or rhinitis.     gabapentin (NEURONTIN) 100 MG capsule Take 1 capsule (100 mg total) by mouth daily as needed. 90 capsule 3  lisinopril (ZESTRIL) 10 MG tablet TAKE 1 TABLET BY MOUTH DAILY 90 tablet 1   metFORMIN (GLUCOPHAGE-XR) 500 MG 24 hr tablet Take 1 tablet (500 mg total) by mouth every evening. 90 tablet 2   midodrine (PROAMATINE) 5 MG tablet Take 1 tablet (5 mg total) by mouth 2 (two) times daily as needed. Take when BP does drop less than 110 when standing. Additional dose at 2 pm if still dropping below 110 when standing 90 tablet 3   Multiple Vitamin (MULTIVITAMIN) tablet Take 1 tablet by mouth daily with lunch.     Omega-3 Fatty Acids (FISH OIL) 1000 MG CAPS Take 1,000 mg by mouth daily with lunch.     Omega-3 Fatty Acids (FISH OIL) 500 MG CAPS Take 500 mg by mouth daily.     ONETOUCH VERIO test strip USE TO CHECK BLOOD SUGAR TWICE DAILY 100 strip 1   psyllium (METAMUCIL) 58.6 % powder Take 1 packet by mouth at bedtime.     rosuvastatin (CRESTOR) 10 MG tablet  Take 1 tablet (10 mg total) by mouth every other day. 90 tablet 1   Semaglutide, 1 MG/DOSE, (OZEMPIC, 1 MG/DOSE,) 4 MG/3ML SOPN Inject 1 mg into the skin every Sunday. 3 mL 0   sodium chloride (OCEAN) 0.65 % SOLN nasal spray Place 1 spray into both nostrils as needed for congestion.     spironolactone (ALDACTONE) 25 MG tablet TAKE 1 TABLET(25 MG) BY MOUTH DAILY 90 tablet 2   tamsulosin (FLOMAX) 0.4 MG CAPS capsule TAKE ONE CAPSULE BY MOUTH DAILY 90 capsule 3   traMADol (ULTRAM) 50 MG tablet TAKE 1 TABLET BY MOUTH EVERY 12 HOURS AS NEEDED 60 tablet 2   pantoprazole (PROTONIX) 40 MG tablet Take 1 tablet (40 mg total) by mouth daily. 90 tablet 3   No current facility-administered medications for this visit.    Allergies as of 10/16/2021 - Review Complete 10/16/2021  Allergen Reaction Noted   Metformin and related Diarrhea and Other (See Comments) 07/13/2018   Azithromycin Other (See Comments) 07/07/2015   Other Other (See Comments) 07/07/2019   Percocet [oxycodone-acetaminophen] Other (See Comments) 07/07/2015   Jardiance [empagliflozin] Other (See Comments) 08/03/2019    ROS:  General: Negative for anorexia, weight loss, fever, chills, fatigue, weakness. ENT: Negative for hoarseness, difficulty swallowing , nasal congestion. CV: Negative for chest pain, angina, palpitations, dyspnea on exertion, peripheral edema.  Respiratory: Negative for dyspnea at rest, dyspnea on exertion, cough, sputum, wheezing.  GI: See history of present illness. GU:  Negative for dysuria, hematuria, urinary incontinence, urinary frequency, nocturnal urination.  Endo: Negative for unusual weight change.    Physical Examination:   BP 113/74   Pulse 61   Temp 98 F (36.7 C) (Oral)   Wt 213 lb (96.6 kg)   BMI 28.89 kg/m   General: Well-nourished, well-developed in no acute distress.  Eyes: No icterus. Conjunctivae pink. Lungs: Clear to auscultation bilaterally. Non-labored. Heart: Regular rate and  rhythm, no murmurs rubs or gallops.  Abdomen: Bowel sounds are normal, nontender, nondistended, no hepatosplenomegaly or masses, no abdominal bruits or hernia , no rebound or guarding.   Extremities: No lower extremity edema. No clubbing or deformities. Neuro: Alert and oriented x 3.  Grossly intact. Skin: Warm and dry, no jaundice.   Psych: Alert and cooperative, normal mood and affect.  Labs:    Imaging Studies: No results found.  Assessment and Plan:   [name] [name] is a 81 y.o. y/o male who comes in today with a  history of GERD and he will have his Protonix refilled.  The patient has been told about his history of adenomatous colon polyps and that he was supposed ever repeat colonoscopy a few years ago.  The patient would like to hold off on any procedures at this time.  He has been told to continue the laxatives since it has been working for him.  The patient will contact me if he has any further concerns.     [name], MD. [name]    Note: This dictation was prepared with Dragon dictation along with smaller phrase technology. Any transcriptional errors that result from this process are unintentional.

## 2021-10-18 ENCOUNTER — Ambulatory Visit: Payer: Medicare Other

## 2021-10-22 ENCOUNTER — Telehealth: Payer: Self-pay | Admitting: Pharmacy Technician

## 2021-10-22 DIAGNOSIS — Z596 Low income: Secondary | ICD-10-CM

## 2021-10-22 DIAGNOSIS — H40003 Preglaucoma, unspecified, bilateral: Secondary | ICD-10-CM | POA: Diagnosis not present

## 2021-10-22 NOTE — Progress Notes (Signed)
[facility])                                            [place] Team    10/22/2021  [name] [name] 06-24-1940 [id number]                                      Medication Assistance Referral-FOR 2024 RE ENROLLMENT  Referral From:  [facility]  Curlene Labrum  Medication/Company: [employer] / [employer] Patient application portion:  Education officer, museum portion: Faxed  to [name] [employer] address/fax verified via: [employer]

## 2021-10-24 ENCOUNTER — Ambulatory Visit
Admission: RE | Admit: 2021-10-24 | Discharge: 2021-10-24 | Disposition: A | Discharge status: Home / Self Care | Payer: Medicare Other | Source: Ambulatory Visit | Attending: Nurse Practitioner | Admitting: Nurse Practitioner

## 2021-10-24 DIAGNOSIS — I3139 Other pericardial effusion (noninflammatory): Secondary | ICD-10-CM | POA: Diagnosis not present

## 2021-10-24 DIAGNOSIS — I700 Atherosclerosis of aorta: Secondary | ICD-10-CM | POA: Diagnosis not present

## 2021-10-24 DIAGNOSIS — R911 Solitary pulmonary nodule: Secondary | ICD-10-CM | POA: Diagnosis not present

## 2021-10-25 ENCOUNTER — Other Ambulatory Visit: Payer: Self-pay | Admitting: Family

## 2021-10-25 DIAGNOSIS — E1165 Type 2 diabetes mellitus with hyperglycemia: Secondary | ICD-10-CM

## 2021-10-29 ENCOUNTER — Encounter: Payer: Self-pay | Admitting: Family

## 2021-10-29 ENCOUNTER — Inpatient Hospital Stay: Payer: Medicare Other | Attending: Nurse Practitioner | Admitting: Nurse Practitioner

## 2021-10-29 DIAGNOSIS — R911 Solitary pulmonary nodule: Secondary | ICD-10-CM

## 2021-10-29 DIAGNOSIS — I252 Old myocardial infarction: Secondary | ICD-10-CM | POA: Diagnosis not present

## 2021-10-29 DIAGNOSIS — H40003 Preglaucoma, unspecified, bilateral: Secondary | ICD-10-CM | POA: Diagnosis not present

## 2021-10-29 DIAGNOSIS — E119 Type 2 diabetes mellitus without complications: Secondary | ICD-10-CM | POA: Insufficient documentation

## 2021-10-29 DIAGNOSIS — I255 Ischemic cardiomyopathy: Secondary | ICD-10-CM | POA: Diagnosis not present

## 2021-10-29 DIAGNOSIS — Z7984 Long term (current) use of oral hypoglycemic drugs: Secondary | ICD-10-CM | POA: Insufficient documentation

## 2021-10-29 DIAGNOSIS — I700 Atherosclerosis of aorta: Secondary | ICD-10-CM | POA: Diagnosis not present

## 2021-10-29 DIAGNOSIS — Z85820 Personal history of malignant melanoma of skin: Secondary | ICD-10-CM | POA: Diagnosis not present

## 2021-10-29 DIAGNOSIS — E785 Hyperlipidemia, unspecified: Secondary | ICD-10-CM | POA: Diagnosis not present

## 2021-10-29 DIAGNOSIS — I10 Essential (primary) hypertension: Secondary | ICD-10-CM | POA: Diagnosis not present

## 2021-10-29 DIAGNOSIS — K219 Gastro-esophageal reflux disease without esophagitis: Secondary | ICD-10-CM | POA: Diagnosis not present

## 2021-10-29 DIAGNOSIS — Z7982 Long term (current) use of aspirin: Secondary | ICD-10-CM | POA: Diagnosis not present

## 2021-10-29 DIAGNOSIS — Z7902 Long term (current) use of antithrombotics/antiplatelets: Secondary | ICD-10-CM | POA: Insufficient documentation

## 2021-10-29 DIAGNOSIS — Z950 Presence of cardiac pacemaker: Secondary | ICD-10-CM | POA: Insufficient documentation

## 2021-10-29 DIAGNOSIS — I2510 Atherosclerotic heart disease of native coronary artery without angina pectoris: Secondary | ICD-10-CM | POA: Diagnosis not present

## 2021-10-29 DIAGNOSIS — Z79899 Other long term (current) drug therapy: Secondary | ICD-10-CM | POA: Diagnosis not present

## 2021-10-29 DIAGNOSIS — R918 Other nonspecific abnormal finding of lung field: Secondary | ICD-10-CM | POA: Diagnosis not present

## 2021-10-29 LAB — HM DIABETES EYE EXAM

## 2021-10-29 NOTE — Progress Notes (Signed)
Pulmonary Nodule Clinic Consult Note [facility]  [phone] [phone]  Patient Care Team: [name], [name] as PCP - General (Family Medicine) [name], MD as PCP - Cardiology (Cardiology) Vickie Epley, MD as PCP - Electrophysiology (Cardiology)   Name of the patient: [name] [name]  [id number]  August 14, 1940   Date of visit: 10/29/2021   Diagnosis- Lung Nodule  Chief complaint/ Reason for visit- Pulmonary Nodule Clinic Follow Up  Past Medical History:  Patient is managed/referred by [name], [name] for incidental pulmonary nodule. He was experiencing abdominal pain, CT abdomen pelvis was obtained and new small bibasilar subpleural nodular density was seen measuring up to 6 mm at the left lung base. This was followed up by CT Chest WO Contrast on 07/31/20 which showed: subpleural left lower lobe nodule measuring 1.0 cm. Findings thought to be unchanged since previous imaging and possibly representative of infectious, inflammatory, or possible aspiration.   Interval history- Patient is 81 year old male who returns to clinic for follow up of incidental pulmonary nodules. He continues to deny unintentional weight loss, fevers, chills, cough, hemoptysis.   ECOG FS:0 - Asymptomatic  Review of systems- Review of Systems  Constitutional:  Negative for chills, diaphoresis, fever, malaise/fatigue and weight loss.  HENT:  Negative for congestion, ear discharge, ear pain, nosebleeds, sinus pain and sore throat.   Eyes:  Negative for pain and redness.  Respiratory:  Negative for cough, hemoptysis, sputum production, shortness of breath, wheezing and stridor.   Cardiovascular:  Negative for chest pain, palpitations and leg swelling.  Gastrointestinal:  Negative for abdominal pain, constipation, diarrhea, nausea and vomiting.  Genitourinary:  Negative for dysuria, flank pain, frequency, hematuria and urgency.  Musculoskeletal:   Negative for falls, joint pain and myalgias.  Skin:  Negative for rash.  Neurological:  Negative for dizziness, loss of consciousness, weakness and headaches.  Psychiatric/Behavioral:  Negative for depression. The patient is not nervous/anxious and does not have insomnia.   All other systems reviewed and are negative.    Allergies  Allergen Reactions   Metformin And Related Diarrhea and Other (See Comments)    Leg cramps, also   Azithromycin Other (See Comments)    Not recommended - no reaction   Other Other (See Comments)    If taking antibiotics for awhile, thrush results   Percocet [Oxycodone-Acetaminophen] Other (See Comments)    Hallucinations   Jardiance [Empagliflozin] Other (See Comments)    Tongue itching/reaction     Past Medical History:  Diagnosis Date   Aortic insufficiency    a. noted on TTE 2015; b. 06/2019 Echo: AI not visualized.   Arthritis    CAD (coronary artery disease)    a. remote PCI in 1991 and 2005; b. MV 3/15: old inferior MI, no ischemia, LVEF 50%, slight inferior wall hypokniesis; c. 06/2019 NSTEMI/PCI: LM nl, LAD 80p/m (Atherectomy & 4.5x18 Resolute Onyx DES), 48md, D1 75 (PTCA), RI patent stent, LCX nl, RCA 100p, RPAV fills via L->R collats from LCX.   Chicken pox    Colon polyps    4 pre-cancerous    Diverticulitis    DM type 2 (diabetes mellitus, type 2) (HCC)    Family history of adverse reaction to anesthesia    GERD (gastroesophageal reflux disease)    Heart murmur    History of kidney stones    HOH (hard of hearing)    Hyperlipidemia    Hypertension    Ischemic cardiomyopathy  a. TTE 2015: EF  50-55%, mild global HK; b. 06/2019 Echo: EF 45-50%, Gr1 DD, basal inf AK. Triv MR.   Kidney stones    Melanoma ([place]) 1980   Resected from his back   Mitral regurgitation    a. noted on TTE 2015   Myocardial infarction [facility])      Past Surgical History:  Procedure Laterality Date   BIV UPGRADE N/A 02/26/2021   Procedure: BIV ICD UPGRADE;   Surgeon: Vickie Epley, MD;  Location: [place] [facility];  Service: Cardiovascular;  Laterality: N/A;   CHOLECYSTECTOMY  2012   COLONOSCOPY     in 2003 with polyp removed and leak anastomosis had to have open abdominal surgery    [place] & 2005   CORONARY ATHERECTOMY N/A 07/08/2019   Procedure: CORONARY ATHERECTOMY;  Surgeon: Jettie Booze, MD;  Location: [place] [facility];  Service: Cardiovascular;  Laterality: N/A;   CORONARY BALLOON ANGIOPLASTY N/A 07/08/2019   Procedure: CORONARY BALLOON ANGIOPLASTY;  Surgeon: Jettie Booze, MD;  Location: [place] [facility];  Service: Cardiovascular;  Laterality: N/A;  diagonal    CORONARY STENT INTERVENTION N/A 07/08/2019   Procedure: CORONARY STENT INTERVENTION;  Surgeon: Jettie Booze, MD;  Location: [place] [facility];  Service: Cardiovascular;  Laterality: N/A;  lad   CYSTOSCOPY/URETEROSCOPY/HOLMIUM LASER/STENT PLACEMENT Right 01/02/2018   Procedure: CYSTOSCOPY/URETEROSCOPY/HOLMIUM LASER/STENT PLACEMENT;  Surgeon: [name], MD;  Location: [facility];  Service: Urology;  Laterality: Right;   ESOPHAGOGASTRODUODENOSCOPY (EGD) WITH PROPOFOL N/A 11/19/2016   Procedure: ESOPHAGOGASTRODUODENOSCOPY (EGD) WITH PROPOFOL;  Surgeon: [name], MD;  Location: [facility];  Service: Endoscopy;  Laterality: N/A;   ESOPHAGOGASTRODUODENOSCOPY (EGD) WITH PROPOFOL N/A 02/02/2019   Procedure: ESOPHAGOGASTRODUODENOSCOPY (EGD) WITH PROPOFOL;  Surgeon: [name], MD;  Location: [facility];  Service: Endoscopy;  Laterality: N/A;   ICD IMPLANT N/A 02/02/2020   Procedure: ICD IMPLANT;  Surgeon: Vickie Epley, MD;  Location: [place] [facility];  Service: Cardiovascular;  Laterality: N/A;   INTRAVASCULAR PRESSURE WIRE/FFR STUDY N/A 07/07/2019   Procedure: INTRAVASCULAR PRESSURE WIRE/FFR STUDY;  Surgeon: Nelva Bush, MD;  Location: [place] [facility];  Service: Cardiovascular;   Laterality: N/A;   INTRAVASCULAR ULTRASOUND/IVUS N/A 07/08/2019   Procedure: Intravascular Ultrasound/IVUS;  Surgeon: Jettie Booze, MD;  Location: [place] [facility];  Service: Cardiovascular;  Laterality: N/A;   LEAD REVISION/REPAIR N/A 02/16/2020   Procedure: LEAD REVISION/REPAIR;  Surgeon: Constance Haw, MD;  Location: [place] [facility];  Service: Cardiovascular;  Laterality: N/A;   LEFT HEART CATH N/A 07/08/2019   Procedure: Left Heart Cath;  Surgeon: Jettie Booze, MD;  Location: [place] [facility];  Service: Cardiovascular;  Laterality: N/A;   LEFT HEART CATH AND CORONARY ANGIOGRAPHY N/A 07/07/2019   Procedure: LEFT HEART CATH AND CORONARY ANGIOGRAPHY;  Surgeon: Nelva Bush, MD;  Location: [place] [facility];  Service: Cardiovascular;  Laterality: N/A;   LITHOTRIPSY  2015   MELANOMA EXCISION  1980   malignant   NERVE SURGERY  2015   ulna nerve   TONSILLECTOMY  1945   WISDOM TOOTH EXTRACTION      Social History   Socioeconomic History   Marital status: Married    Spouse name: [place] Circle   Number of children: Not on file   Years of education: Not on file   Highest education level: Not on file  Occupational History   Not on file  Tobacco Use  Smoking status: Some Days    Types: Pipe    Passive exposure: Current   Smokeless tobacco: Former    Quit date: 10/17/2015  Vaping Use   Vaping Use: Never used  Substance and Sexual Activity   Alcohol use: Yes    Alcohol/week: 0.0 - 1.0 standard drinks of alcohol    Comment: OCCASIONALLY   Drug use: No   Sexual activity: Not on file  Other Topics Concern   Not on file  Social History Narrative   Married    Social Determinants of Health   Financial Resource Strain: Medium Risk (12/25/2020)   Overall Financial Resource Strain (CARDIA)    Difficulty of Paying Living Expenses: Somewhat hard  Food Insecurity: No Food Insecurity (10/12/2020)   Hunger Vital Sign    Worried About Running Out of Food in the Last  Year: Never true    Ran Out of Food in the Last Year: Never true  Transportation Needs: No Transportation Needs (10/12/2020)   PRAPARE - Hydrologist (Medical): No    Lack of Transportation (Non-Medical): No  Physical Activity: Insufficiently Active (10/12/2020)   Exercise Vital Sign    Days of Exercise per Week: 3 days    Minutes of Exercise per Session: 30 min  Stress: No Stress Concern Present (10/12/2020)   [place]    Feeling of Stress : Not at all  Social Connections: Unknown (10/12/2020)   Social Connection and Isolation Panel [NHANES]    Frequency of Communication with Friends and Family: Not on file    Frequency of Social Gatherings with Friends and Family: Not on file    Attends Religious Services: Not on file    Active Member of Clubs or Organizations: Not on file    Attends Archivist Meetings: Not on file    Marital Status: Married  Intimate Partner Violence: Not At Risk (10/12/2020)   Humiliation, Afraid, Rape, and Kick questionnaire    Fear of Current or Ex-Partner: No    Emotionally Abused: No    Physically Abused: No    Sexually Abused: No    Family History  Problem Relation Age of Onset   Hyperlipidemia Mother    Hypertension Mother    Heart disease Mother    Diabetes Mother    Heart attack Mother    Colon cancer Father        metasized to liver, adrenal, lungs   Lung cancer Father    Kidney cancer Father        malignant capsulated kidney tumor   Diabetes Father    Liver cancer Father    Bladder Cancer Neg Hx    Prostate cancer Neg Hx      Current Outpatient Medications:    amiodarone (PACERONE) 200 MG tablet, Take 1 tablet (200 mg total) by mouth daily., Disp: 90 tablet, Rfl: 1   aspirin EC 81 MG tablet, Take 1 tablet (81 mg total) by mouth daily. Swallow whole. (Patient taking differently: Take 81 mg by mouth daily with lunch. Swallow whole.),  Disp: 90 tablet, Rfl: 3   bisacodyl (DULCOLAX) 5 MG EC tablet, Take 5 mg by mouth daily as needed for moderate constipation., Disp: , Rfl:    carvedilol (COREG) 3.125 MG tablet, Take 1 tablet (3.125 mg total) by mouth 2 (two) times daily with a meal., Disp: 60 tablet, Rfl: 5   clopidogrel (PLAVIX) 75 MG tablet, TAKE 1 TABLET(75 MG) BY  MOUTH DAILY, Disp: 30 tablet, Rfl: 3   docusate sodium (COLACE) 100 MG capsule, Take 100 mg by mouth in the morning, at noon, and at bedtime., Disp: , Rfl:    ezetimibe (ZETIA) 10 MG tablet, TAKE 1 TABLET(10 MG) BY MOUTH DAILY, Disp: 90 tablet, Rfl: 2   famotidine (PEPCID) 20 MG tablet, Take 20 mg by mouth daily with lunch., Disp: , Rfl:    fluticasone (FLONASE) 50 MCG/ACT nasal spray, Place into both nostrils daily as needed for allergies or rhinitis., Disp: , Rfl:    gabapentin (NEURONTIN) 100 MG capsule, Take 1 capsule (100 mg total) by mouth daily as needed., Disp: 90 capsule, Rfl: 3   lisinopril (ZESTRIL) 10 MG tablet, TAKE 1 TABLET BY MOUTH DAILY, Disp: 90 tablet, Rfl: 1   metFORMIN (GLUCOPHAGE-XR) 500 MG 24 hr tablet, Take 1 tablet (500 mg total) by mouth every evening., Disp: 90 tablet, Rfl: 2   midodrine (PROAMATINE) 5 MG tablet, Take 1 tablet (5 mg total) by mouth 2 (two) times daily as needed. Take when BP does drop less than 110 when standing. Additional dose at 2 pm if still dropping below 110 when standing, Disp: 90 tablet, Rfl: 3   Multiple Vitamin (MULTIVITAMIN) tablet, Take 1 tablet by mouth daily with lunch., Disp: , Rfl:    Omega-3 Fatty Acids (FISH OIL) 1000 MG CAPS, Take 1,000 mg by mouth daily with lunch., Disp: , Rfl:    Omega-3 Fatty Acids (FISH OIL) 500 MG CAPS, Take 500 mg by mouth daily., Disp: , Rfl:    ONETOUCH VERIO test strip, USE TO CHECK BLOOD SUGAR TWICE DAILY, Disp: 100 strip, Rfl: 1   pantoprazole (PROTONIX) 40 MG tablet, Take 1 tablet (40 mg total) by mouth daily., Disp: 90 tablet, Rfl: 3   psyllium (METAMUCIL) 58.6 % powder, Take 1  packet by mouth at bedtime., Disp: , Rfl:    rosuvastatin (CRESTOR) 10 MG tablet, Take 1 tablet (10 mg total) by mouth every other day., Disp: 90 tablet, Rfl: 1   Semaglutide, 1 MG/DOSE, (OZEMPIC, 1 MG/DOSE,) 4 MG/3ML SOPN, Inject 1 mg into the skin every Sunday., Disp: 3 mL, Rfl: 0   sodium chloride (OCEAN) 0.65 % SOLN nasal spray, Place 1 spray into both nostrils as needed for congestion., Disp: , Rfl:    spironolactone (ALDACTONE) 25 MG tablet, TAKE 1 TABLET(25 MG) BY MOUTH DAILY, Disp: 90 tablet, Rfl: 2   tamsulosin (FLOMAX) 0.4 MG CAPS capsule, TAKE ONE CAPSULE BY MOUTH DAILY, Disp: 90 capsule, Rfl: 3   traMADol (ULTRAM) 50 MG tablet, TAKE 1 TABLET BY MOUTH EVERY 12 HOURS AS NEEDED, Disp: 60 tablet, Rfl: 2  Physical exam: There were no vitals filed for this visit. Physical Exam Vitals reviewed.  Constitutional:      Appearance: He is not ill-appearing.  Pulmonary:     Effort: No respiratory distress.  Skin:    Coloration: Skin is not pale.  Neurological:     Mental Status: He is alert and oriented to person, place, and time.  Psychiatric:        Mood and Affect: Mood normal.        Behavior: Behavior normal.     CT Chest Wo Contrast  Result Date: 10/25/2021 CLINICAL DATA:  Pulmonary nodule follow-up. No smoking history. Remote history of melanoma. EXAM: CT CHEST WITHOUT CONTRAST TECHNIQUE: Multidetector CT imaging of the chest was performed following the standard protocol without IV contrast. RADIATION DOSE REDUCTION: This exam was performed according to the  departmental dose-optimization program which includes automated exposure control, adjustment of the mA and/or kV according to patient size and/or use of iterative reconstruction technique. COMPARISON:  Chest CT 10/26/2020, 07/28/2020. FINDINGS: Cardiovascular: The heart is normal in size. Coronary artery calcifications or stents. Pacemaker in place. No pericardial effusion. Trace pericardial effusion anteriorly. Calcified and  tortuous thoracic aorta without aneurysm. Mediastinum/Nodes: No mediastinal adenopathy. No obvious hilar adenopathy on this unenhanced exam limiting hilar assessment. No axillary adenopathy. The esophagus is decompressed. No visible thyroid nodule. Lungs/Pleura: There is a faint 5 mm ground-glass nodule in the right lower lobe series 3, image 120. This is unchanged from prior exams. This nodule is seen on lung bases from abdominal CT 12/01/2018, constituting 3 years of imaging stability, however was not seen on more remote 2018 exam. No new or additional pulmonary nodules. No focal airspace disease. Trachea and central bronchi are patent. No pleural fluid. Upper Abdomen: Cysts in the upper pole of both kidneys are only partially included in the field of view. No specific imaging follow-up is recommended. Multiple left renal calculi, largest measuring 9 mm. Cholecystectomy. Musculoskeletal: There are no acute or suspicious osseous abnormalities. Mild left gynecomastia. Soft tissues are otherwise unremarkable. IMPRESSION: 1. Faint 5 mm ground-glass nodule in the right lower lobe. This is unchanged from prior exams. This nodule is seen on lung bases from abdominal CT 12/01/2018, constituting 3 years of imaging stability, however was not seen on more remote 2018 exam. Recommend additional CT follow-up in 2 years to establish 5 years of stability, recommendation follows Fleischner Society guidelines. 2. No new or additional pulmonary nodules. 3. Left renal calculi. Aortic Atherosclerosis (ICD10-I70.0). Electronically Signed   By: Keith Rake M.D.   On: 10/25/2021 17:39     Assessment and plan- Patient is a 81 y.o. male who presents to pulmonary nodule clinic for follow-up of incidental lung nodules.     CT chest without contrast from today 10/27/2020 was independently reviewed and revealed: nodular density in left lower lobe has resolved. He has a stable 5 mm sub solid nodule in the right lower lobe. Extensive  coronary artery calcifications and aortic atherosclerosis.    10/24/21- CT Chest wo contrast was independently reviewed. RLL revealed faint 5 mm ground glass nodule that is stable. Stable x 3 years. No new or additional nodules. Extensive coronary artery calcifications and pacemaker.   Given his prior history of smoking, he is high risk for development of lung cancer. However, given 3 years of stability, small nodule, results are reassuring. We reviewed that guidelines support surveillance for 5 years however. Can consider re-imaging in October 2025 if patient is still healthy otherwise and would consider treatment of lung cancer should it be discovered. He can currently be released from surveillance with pulmonary nodule program and can follow up with PCP.    I provided patient with copy of his most recent ct scan.   Disposition: Follow up as needed.    Visit Diagnosis No diagnosis found.  Patient expressed understanding and was in agreement with this plan. He also understands that He can call clinic at any time with any questions, concerns, or complaints.   Thank you for allowing me to participate in the care of this very pleasant patient.   Verlon Au, NP [facility] 10/29/2021   CC: [name], [name]

## 2021-10-31 NOTE — Progress Notes (Signed)
ABSTRACT

## 2021-11-03 ENCOUNTER — Other Ambulatory Visit: Payer: Self-pay | Admitting: Family

## 2021-11-03 DIAGNOSIS — I10 Essential (primary) hypertension: Secondary | ICD-10-CM

## 2021-11-20 ENCOUNTER — Encounter: Payer: Self-pay | Admitting: Family Medicine

## 2021-11-20 ENCOUNTER — Telehealth (INDEPENDENT_AMBULATORY_CARE_PROVIDER_SITE_OTHER): Payer: Medicare Other | Admitting: Family Medicine

## 2021-11-20 VITALS — Ht 72.0 in

## 2021-11-20 DIAGNOSIS — R059 Cough, unspecified: Secondary | ICD-10-CM | POA: Diagnosis not present

## 2021-11-20 DIAGNOSIS — R0981 Nasal congestion: Secondary | ICD-10-CM | POA: Diagnosis not present

## 2021-11-20 DIAGNOSIS — R519 Headache, unspecified: Secondary | ICD-10-CM | POA: Diagnosis not present

## 2021-11-20 MED ORDER — AMOXICILLIN-POT CLAVULANATE 875-125 MG PO TABS: 1.0000 | ORAL_TABLET | Freq: Two times a day (BID) | ORAL | 0 refills | Status: DC

## 2021-11-20 MED ORDER — BENZONATATE 100 MG PO CAPS: ORAL_CAPSULE | ORAL | 0 refills | Status: DC

## 2021-11-20 NOTE — Progress Notes (Signed)
Virtual Visit via Video Note  I connected with [name] [name]  on 11/20/21 at  1:20 PM EST by a video enabled telemedicine application and verified that I am speaking with the correct person using two identifiers.  Location patient: [facility] Location provider:work or home office Persons participating in the virtual visit: patient, provider, patient's wife  I discussed the limitations and requested verbal permission for telemedicine visit. The patient expressed understanding and agreed to proceed.   HPI:  Acute telemedicine visit for upper resp symptoms: -Onset: about 5 days ago worse, but has has sinus congestion for much longer -did covid test x 2: negative -Symptoms include: sore throat, nasal congestion, pnd, cough, sometimes yellow sinus congestion, some sinus discomfort at times -Denies: CP, SOB, NVD, fever, body aches, sinus pain -Has tried:sudafed, coricidin -Pertinent past medical history: see below -Pertinent medication allergies: Allergies  Allergen Reactions   Metformin And Related Diarrhea and Other (See Comments)    Leg cramps, also   Azithromycin Other (See Comments)    Not recommended - no reaction   Other Other (See Comments)    If taking antibiotics for awhile, thrush results   Percocet [Oxycodone-Acetaminophen] Other (See Comments)    Hallucinations   Jardiance [Empagliflozin] Other (See Comments)    Tongue itching/reaction  -COVID-19 vaccine status:  Immunization History  Administered Date(s) Administered   Fluad Quad(high Dose 65+) 10/13/2020, 09/28/2021   Influenza, High Dose Seasonal PF 11/01/2015, 10/20/2017, 09/28/2019   Influenza,inj,Quad PF,6+ Mos 10/08/2016   Influenza-Unspecified 10/01/2018   Moderna Sars-Covid-2 Vaccination 05/08/2020   PFIZER(Purple Top)SARS-COV-2 Vaccination 02/04/2019, 02/25/2019, 10/12/2019   PNEUMOCOCCAL CONJUGATE-20 02/07/2021   Pfizer Covid-19 Vaccine Bivalent Booster 16yr & up 11/04/2020     ROS: See pertinent positives and  negatives per HPI.  Past Medical History:  Diagnosis Date   Aortic insufficiency    a. noted on TTE 2015; b. 06/2019 Echo: AI not visualized.   Arthritis    CAD (coronary artery disease)    a. remote PCI in 1991 and 2005; b. MV 3/15: old inferior MI, no ischemia, LVEF 50%, slight inferior wall hypokniesis; c. 06/2019 NSTEMI/PCI: LM nl, LAD 80p/m (Atherectomy & 4.5x18 Resolute Onyx DES), 372m, D1 75 (PTCA), RI patent stent, LCX nl, RCA 100p, RPAV fills via L->R collats from LCX.   Chicken pox    Colon polyps    4 pre-cancerous    Diverticulitis    DM type 2 (diabetes mellitus, type 2) (HCC)    Family history of adverse reaction to anesthesia    GERD (gastroesophageal reflux disease)    Heart murmur    History of kidney stones    HOH (hard of hearing)    Hyperlipidemia    Hypertension    Ischemic cardiomyopathy    a. TTE 2015: EF  50-55%, mild global HK; b. 06/2019 Echo: EF 45-50%, Gr1 DD, basal inf AK. Triv MR.   Kidney stones    [facility]an[facility] [id number]   Resected from his back   Mitral regurgitation    a. noted on TTE 2015   [facility]    Past Surgical History:  Procedure Laterality Date   BIV UPGRADE N/A 02/26/2021   Procedure: BIV ICD UPGRADE;  Surgeon: LaVickie EpleyMD;  [facility];  Service: Cardiovascular;  Laterality: N/A;   CHOLECYSTECTOMY  2012   COLONOSCOPY     in 2003 with polyp removed and leak anastomosis had to have open abdominal surgery    CORONARY ANGIOPLASTY WITH STENT PLACEMENT  1991 & 2005   CORONARY ATHERECTOMY N/A 07/08/2019   Procedure: CORONARY ATHERECTOMY;  Surgeon: Jettie Booze, MD;  Location: [facility];  Service: Cardiovascular;  Laterality: N/A;   CORONARY BALLOON ANGIOPLASTY N/A 07/08/2019   Procedure: CORONARY BALLOON ANGIOPLASTY;  Surgeon: Jettie Booze, MD;  Location: [facility];  Service: Cardiovascular;  Laterality: N/A;  diagonal    CORONARY STENT INTERVENTION N/A 07/08/2019    Procedure: CORONARY STENT INTERVENTION;  Surgeon: Jettie Booze, MD;  Location: [facility];  Service: Cardiovascular;  Laterality: N/A;  lad   CYSTOSCOPY/URETEROSCOPY/HOLMIUM LASER/STENT PLACEMENT Right 01/02/2018   Procedure: CYSTOSCOPY/URETEROSCOPY/HOLMIUM LASER/STENT PLACEMENT;  Surgeon: [name], MD;  Location: [facility];  Service: Urology;  Laterality: Right;   ESOPHAGOGASTRODUODENOSCOPY (EGD) WITH PROPOFOL N/A 11/19/2016   Procedure: ESOPHAGOGASTRODUODENOSCOPY (EGD) WITH PROPOFOL;  Surgeon: [name], MD;  Location: [facility];  Service: Endoscopy;  Laterality: N/A;   ESOPHAGOGASTRODUODENOSCOPY (EGD) WITH PROPOFOL N/A 02/02/2019   Procedure: ESOPHAGOGASTRODUODENOSCOPY (EGD) WITH PROPOFOL;  Surgeon: [name], MD;  Location: [facility];  Service: Endoscopy;  Laterality: N/A;   ICD IMPLANT N/A 02/02/2020   Procedure: ICD IMPLANT;  Surgeon: Vickie Epley, MD;  Location: [place] [facility];  Service: Cardiovascular;  Laterality: N/A;   INTRAVASCULAR PRESSURE WIRE/FFR STUDY N/A 07/07/2019   Procedure: INTRAVASCULAR PRESSURE WIRE/FFR STUDY;  Surgeon: Nelva Bush, MD;  Location: [facility];  Service: Cardiovascular;  Laterality: N/A;   INTRAVASCULAR ULTRASOUND/IVUS N/A 07/08/2019   Procedure: Intravascular Ultrasound/IVUS;  Surgeon: Jettie Booze, MD;  Location: [place];  Service: Cardiovascular;  Laterality: N/A;   LEAD REVISION/REPAIR N/A 02/16/2020   Procedure: LEAD REVISION/REPAIR;  Surgeon: Constance Haw, MD;  Location: [place];  Service: Cardiovascular;  Laterality: N/A;   LEFT HEART CATH N/A 07/08/2019   Procedure: Left Heart Cath;  Surgeon: Jettie Booze, MD;  Location: [facility];  Service: Cardiovascular;  Laterality: N/A;   LEFT HEART CATH AND CORONARY ANGIOGRAPHY N/A 07/07/2019   Procedure: LEFT HEART CATH AND CORONARY ANGIOGRAPHY;  Surgeon: Nelva Bush, MD;  Location: [facility];  Service: Cardiovascular;  Laterality: N/A;   LITHOTRIPSY  2015   MELANOMA EXCISION  1980   malignant   NERVE SURGERY  2015   ulna nerve   TONSILLECTOMY  1945   WISDOM TOOTH EXTRACTION       Current Outpatient Medications:    amoxicillin-clavulanate (AUGMENTIN) 875-125 MG tablet, Take 1 tablet by mouth 2 (two) times daily., Disp: 20 tablet, Rfl: 0   benzonatate (TESSALON PERLES) 100 MG capsule, 1-2 capsules up to twice daily as needed for cough, Disp: 30 capsule, Rfl: 0   amiodarone (PACERONE) 200 MG tablet, Take 1 tablet (200 mg total) by mouth daily., Disp: 90 tablet, Rfl: 1   aspirin EC 81 MG tablet, Take 1 tablet (81 mg total) by mouth daily. Swallow whole. (Patient taking differently: Take 81 mg by mouth daily with lunch. Swallow whole.), Disp: 90 tablet, Rfl: 3   bisacodyl (DULCOLAX) 5 MG EC tablet, Take 5 mg by mouth daily as needed for moderate constipation., Disp: , Rfl:    carvedilol (COREG) 3.125 MG tablet, Take 1 tablet (3.125 mg total) by mouth 2 (two) times daily with a meal., Disp: 60 tablet, Rfl: 5   clopidogrel (PLAVIX) 75 MG tablet, TAKE 1 TABLET(75 MG) BY MOUTH DAILY, Disp: 30 tablet, Rfl: 3   docusate sodium (COLACE) 100 MG capsule, Take 100  mg by mouth in the morning, at noon, and at bedtime., Disp: , Rfl:    ezetimibe (ZETIA) 10 MG tablet, TAKE 1 TABLET(10 MG) BY MOUTH DAILY, Disp: 90 tablet, Rfl: 2   famotidine (PEPCID) 20 MG tablet, Take 20 mg by mouth daily with lunch., Disp: , Rfl:    fluticasone (FLONASE) 50 MCG/ACT nasal spray, Place into both nostrils daily as needed for allergies or rhinitis., Disp: , Rfl:    gabapentin (NEURONTIN) 100 MG capsule, Take 1 capsule (100 mg total) by mouth daily as needed., Disp: 90 capsule, Rfl: 3   lisinopril (ZESTRIL) 10 MG tablet, TAKE 1 TABLET BY MOUTH DAILY, Disp: 90 tablet, Rfl: 1   metFORMIN (GLUCOPHAGE-XR) 500 MG 24 hr tablet, Take 1 tablet (500 mg total) by mouth every evening., Disp: 90 tablet, Rfl: 2    midodrine (PROAMATINE) 5 MG tablet, Take 1 tablet (5 mg total) by mouth 2 (two) times daily as needed. Take when BP does drop less than 110 when standing. Additional dose at 2 pm if still dropping below 110 when standing, Disp: 90 tablet, Rfl: 3   Multiple Vitamin (MULTIVITAMIN) tablet, Take 1 tablet by mouth daily with lunch., Disp: , Rfl:    Omega-3 Fatty Acids (FISH OIL) 1000 MG CAPS, Take 1,000 mg by mouth daily with lunch., Disp: , Rfl:    Omega-3 Fatty Acids (FISH OIL) 500 MG CAPS, Take 500 mg by mouth daily., Disp: , Rfl:    ONETOUCH VERIO test strip, USE TO CHECK BLOOD SUGAR TWICE DAILY, Disp: 100 strip, Rfl: 1   pantoprazole (PROTONIX) 40 MG tablet, Take 1 tablet (40 mg total) by mouth daily., Disp: 90 tablet, Rfl: 3   psyllium (METAMUCIL) 58.6 % powder, Take 1 packet by mouth at bedtime., Disp: , Rfl:    rosuvastatin (CRESTOR) 10 MG tablet, Take 1 tablet (10 mg total) by mouth every other day., Disp: 90 tablet, Rfl: 1   Semaglutide, 1 MG/DOSE, (OZEMPIC, 1 MG/DOSE,) 4 MG/3ML SOPN, Inject 1 mg into the skin every Sunday., Disp: 3 mL, Rfl: 0   sodium chloride (OCEAN) 0.65 % SOLN nasal spray, Place 1 spray into both nostrils as needed for congestion., Disp: , Rfl:    spironolactone (ALDACTONE) 25 MG tablet, TAKE 1 TABLET(25 MG) BY MOUTH DAILY, Disp: 90 tablet, Rfl: 2   tamsulosin (FLOMAX) 0.4 MG CAPS capsule, TAKE ONE CAPSULE BY MOUTH DAILY, Disp: 90 capsule, Rfl: 3   traMADol (ULTRAM) 50 MG tablet, TAKE 1 TABLET BY MOUTH EVERY 12 HOURS AS NEEDED, Disp: 60 tablet, Rfl: 2  EXAM:  VITALS per patient if applicable: denies fever  GENERAL: alert, oriented, in no acute distress  LUNGS: no obvious gross SOB, gasping or wheezing  PSYCH/NEURO: pleasant and cooperative, no obvious depression or anxiety, speech and thought processing grossly intact  ASSESSMENT AND PLAN:  Discussed the following assessment and plan:  Nasal congestion  Facial discomfort  Cough, unspecified type  -we  discussed possible serious and likely etiologies, options for evaluation and workup, limitations of telemedicine visit vs in person visit, treatment, treatment risks and precautions. Pt is agreeable to treatment via telemedicine at this moment. Query rhinosinsusitis - possibly bacterial given ongoing and worsened. He has opted to try nasal saline rinse for a few days, initiation of augmentin if worsening or not improving and tessalon if needed for cough.  Advised to seek prompt virtual visit or in person care if worsening, new symptoms arise, or if is not improving with treatment as expected per  our conversation of expected course. Discussed options for follow up care. Did let this patient know that I do telemedicine on Tuesdays and Thursdays for [facility] and those are the days I am logged into the system. Advised to schedule follow up visit with PCP, [place] virtual visits or [facility] if any further questions or concerns to avoid delays in care.   I discussed the assessment and treatment plan with the patient. The patient was provided an opportunity to ask questions and all were answered. The patient agreed with the plan and demonstrated an understanding of the instructions.     Lucretia Kern, DO

## 2021-11-20 NOTE — Patient Instructions (Signed)
-  I sent the medication(s) we discussed to your pharmacy: Meds ordered this encounter  Medications   benzonatate (TESSALON PERLES) 100 MG capsule    Sig: 1-2 capsules up to twice daily as needed for cough    Dispense:  30 capsule    Refill:  0   amoxicillin-clavulanate (AUGMENTIN) 875-125 MG tablet    Sig: Take 1 tablet by mouth 2 (two) times daily.    Dispense:  20 tablet    Refill:  0     I hope you are feeling better soon!  Seek in person care promptly if your symptoms worsen, new concerns arise or you are not improving with treatment.  It was nice to meet you today. I help [facility] out with telemedicine visits on Tuesdays and Thursdays and am happy to help if you need a virtual follow up visit on those days. Otherwise, if you have any concerns or questions following this visit please schedule a follow up visit with your Primary Care office or seek care at a local urgent care clinic to avoid delays in care. If you are having severe or life threatening symptoms please call 911 and/or go to the nearest emergency room.

## 2021-11-21 ENCOUNTER — Telehealth: Payer: Self-pay | Admitting: Pharmacy Technician

## 2021-11-21 DIAGNOSIS — Z596 Low income: Secondary | ICD-10-CM

## 2021-11-21 NOTE — Progress Notes (Signed)
[name] [name] [facility])                                            [facility] Team    11/21/2021  [name] [name] 01/06/1941 [id number]  Received both patient and provider portion(s) of patient assistance application(s) for Ozempic. Faxed completed application and required documents into [employer].    Aariah Godette P. [name], [place]  [phone]

## 2021-11-27 ENCOUNTER — Ambulatory Visit (INDEPENDENT_AMBULATORY_CARE_PROVIDER_SITE_OTHER): Payer: Medicare Other

## 2021-11-27 DIAGNOSIS — I255 Ischemic cardiomyopathy: Secondary | ICD-10-CM

## 2021-11-27 LAB — CUP PACEART REMOTE DEVICE CHECK
Battery Remaining Longevity: 132 mo
Battery Remaining Percentage: 100 %
Brady Statistic RA Percent Paced: 9 %
Brady Statistic RV Percent Paced: 95 %
Date Time Interrogation Session: 20231113232700
HighPow Impedance: 81 Ohm
Implantable Lead Connection Status: 753985
Implantable Lead Connection Status: 753985
Implantable Lead Connection Status: 753985
Implantable Lead Implant Date: 20220202
Implantable Lead Implant Date: 20230213
Implantable Lead Implant Date: 20230213
Implantable Lead Location: 753858
Implantable Lead Location: 753859
Implantable Lead Location: 753860
Implantable Lead Model: 273
Implantable Lead Model: 4671
Implantable Lead Model: 7841
Implantable Lead Serial Number: 111255
Implantable Lead Serial Number: 1233258
Implantable Lead Serial Number: 861310
Implantable Pulse Generator Implant Date: 20230213
Lead Channel Impedance Value: 396 Ohm
Lead Channel Impedance Value: 560 Ohm
Lead Channel Impedance Value: 593 Ohm
Lead Channel Pacing Threshold Amplitude: 1 V
Lead Channel Pacing Threshold Pulse Width: 0.4 ms
Lead Channel Setting Pacing Amplitude: 1.5 V
Lead Channel Setting Pacing Amplitude: 2 V
Lead Channel Setting Pacing Amplitude: 2 V
Lead Channel Setting Pacing Pulse Width: 0.4 ms
Lead Channel Setting Pacing Pulse Width: 1 ms
Lead Channel Setting Sensing Sensitivity: 0.6 mV
Lead Channel Setting Sensing Sensitivity: 1 mV
Pulse Gen Serial Number: 279753

## 2021-11-28 ENCOUNTER — Encounter: Payer: Self-pay | Admitting: Family

## 2021-11-28 ENCOUNTER — Ambulatory Visit (INDEPENDENT_AMBULATORY_CARE_PROVIDER_SITE_OTHER): Payer: Medicare Other

## 2021-11-28 ENCOUNTER — Ambulatory Visit (INDEPENDENT_AMBULATORY_CARE_PROVIDER_SITE_OTHER): Payer: Medicare Other | Admitting: Family

## 2021-11-28 VITALS — BP 128/82 | HR 56 | Temp 98.1°F | Ht 72.0 in | Wt 209.2 lb

## 2021-11-28 DIAGNOSIS — R911 Solitary pulmonary nodule: Secondary | ICD-10-CM

## 2021-11-28 DIAGNOSIS — J40 Bronchitis, not specified as acute or chronic: Secondary | ICD-10-CM

## 2021-11-28 DIAGNOSIS — I10 Essential (primary) hypertension: Secondary | ICD-10-CM

## 2021-11-28 DIAGNOSIS — E118 Type 2 diabetes mellitus with unspecified complications: Secondary | ICD-10-CM | POA: Diagnosis not present

## 2021-11-28 DIAGNOSIS — R059 Cough, unspecified: Secondary | ICD-10-CM | POA: Diagnosis not present

## 2021-11-28 LAB — POCT GLYCOSYLATED HEMOGLOBIN (HGB A1C): Hemoglobin A1C: 6.5 % — AB (ref 4.0–5.6)

## 2021-11-28 MED ORDER — SEMAGLUTIDE (1 MG/DOSE) 4 MG/3ML ~~LOC~~ SOPN: 1.0000 mg | PEN_INJECTOR | SUBCUTANEOUS | 3 refills | Status: DC

## 2021-11-28 NOTE — Progress Notes (Signed)
Discussed during OV. Please see OV notes

## 2021-11-28 NOTE — Progress Notes (Signed)
Subjective:    Patient ID: [name] [name], male    DOB: 1940-02-13, 81 y.o.   MRN: [id number]  CC: [name] [name] is a 81 y.o. male who presents today for follow up.   HPI: Accompanied by wife today   Complains of continue nasal congestion x 13 days, with some improvement. Congestion had been thick and clear, runny.   Endorses PND and he will have cough when laying down.  Negative covid at home.   No fever, ear pain,  cp, sob    He had a video visit 11/20/2021 and was prescribed amoxicillin/clavulanate 10 day course, benzonatate. He is using cloricidin, netti pot with some improvement.    DM-he is no longer on metformin. Compliant Ozempic '1mg'$  which she is tolerating.  BM every other day which are normal for him. He has resumed glipizide '5mg'$  qam, '10mg'$  with dinner. FBG 123, 104, 130.   HTN-compliant with carvedilol 3.125 mg twice daily, lisinopril 10 mg daily, spironolactone 25 mg QD  Cardiomyopathy-compliant with amiodarone 200 mg  He had follow-up with Beckey Rutter 10/29/2021 regarding incidental lung nodule Recommended surveillance in 5 years, October 2025 Consult with Dr. [name] 10/16/2021 regarding constipation.  Patient declined colonoscopy  Follow-up Dr. Rockey Situ, 10/16/2021 for history of sustained VT, cardiomyopathy.  Recommended to continue Coreg, lisinopril and spironolactone.  He will continue follow-up with electrophysiology, Dr Quentin Ore ( appt 12/12/21)  HISTORY:  Past Medical History:  Diagnosis Date   Aortic insufficiency    a. noted on TTE 2015; b. 06/2019 Echo: AI not visualized.   Arthritis    CAD (coronary artery disease)    a. remote PCI in 1991 and 2005; b. MV 3/15: old inferior MI, no ischemia, LVEF 50%, slight inferior wall hypokniesis; c. 06/2019 NSTEMI/PCI: LM nl, LAD 80p/m (Atherectomy & 4.5x18 Resolute Onyx DES), 68md, D1 75 (PTCA), RI patent stent, LCX nl, RCA 100p, RPAV fills via L->R collats from LCX.   Chicken pox    Colon polyps    4 pre-cancerous     Diverticulitis    DM type 2 (diabetes mellitus, type 2) (HCC)    Family history of adverse reaction to anesthesia    GERD (gastroesophageal reflux disease)    Heart murmur    History of kidney stones    HOH (hard of hearing)    Hyperlipidemia    Hypertension    Ischemic cardiomyopathy    a. TTE 2015: EF  50-55%, mild global HK; b. 06/2019 Echo: EF 45-50%, Gr1 DD, basal inf AK. Triv MR.   Kidney stones    [place]an[place]   Resected from his back   Mitral regurgitation    a. noted on TTE 2015   Myocardial infarction ([facility]    Past Surgical History:  Procedure Laterality Date   BIV UPGRADE N/A 02/26/2021   Procedure: BIV ICD UPGRADE;  Surgeon: LVickie Epley MD;  [facility];  Service: Cardiovascular;  Laterality: N/A;   CHOLECYSTECTOMY  2012   COLONOSCOPY     in 2003 with polyp removed and leak anastomosis had to have open abdominal surgery    [place] 2005   CORONARY ATHERECTOMY N/A 07/08/2019   Procedure: CORONARY ATHERECTOMY;  Surgeon: VJettie Booze MD;  [facility];  Service: Cardiovascular;  Laterality: N/A;   CORONARY BALLOON ANGIOPLASTY N/A 07/08/2019   Procedure: CORONARY BALLOON ANGIOPLASTY;  Surgeon: VJettie Booze MD;  [place];  Service:  Cardiovascular;  Laterality: N/A;  diagonal    CORONARY STENT INTERVENTION N/A 07/08/2019   Procedure: CORONARY STENT INTERVENTION;  Surgeon: Jettie Booze, MD;  Location: [place] [facility];  Service: Cardiovascular;  Laterality: N/A;  lad   CYSTOSCOPY/URETEROSCOPY/HOLMIUM LASER/STENT PLACEMENT Right 01/02/2018   Procedure: CYSTOSCOPY/URETEROSCOPY/HOLMIUM LASER/STENT PLACEMENT;  Surgeon: [name], MD;  Location: [facility];  Service: Urology;  Laterality: Right;   ESOPHAGOGASTRODUODENOSCOPY (EGD) WITH PROPOFOL N/A 11/19/2016   Procedure: ESOPHAGOGASTRODUODENOSCOPY (EGD) WITH PROPOFOL;  Surgeon: [name],  MD;  Location: [facility];  Service: Endoscopy;  Laterality: N/A;   ESOPHAGOGASTRODUODENOSCOPY (EGD) WITH PROPOFOL N/A 02/02/2019   Procedure: ESOPHAGOGASTRODUODENOSCOPY (EGD) WITH PROPOFOL;  Surgeon: [name], MD;  Location: [facility];  Service: Endoscopy;  Laterality: N/A;   ICD IMPLANT N/A 02/02/2020   Procedure: ICD IMPLANT;  Surgeon: Vickie Epley, MD;  Location: [place] [facility];  Service: Cardiovascular;  Laterality: N/A;   INTRAVASCULAR PRESSURE WIRE/FFR STUDY N/A 07/07/2019   Procedure: INTRAVASCULAR PRESSURE WIRE/FFR STUDY;  Surgeon: Nelva Bush, MD;  Location: [place] [facility];  Service: Cardiovascular;  Laterality: N/A;   INTRAVASCULAR ULTRASOUND/IVUS N/A 07/08/2019   Procedure: Intravascular Ultrasound/IVUS;  Surgeon: Jettie Booze, MD;  Location: [place] [facility];  Service: Cardiovascular;  Laterality: N/A;   LEAD REVISION/REPAIR N/A 02/16/2020   Procedure: LEAD REVISION/REPAIR;  Surgeon: Constance Haw, MD;  Location: [place] [facility];  Service: Cardiovascular;  Laterality: N/A;   LEFT HEART CATH N/A 07/08/2019   Procedure: Left Heart Cath;  Surgeon: Jettie Booze, MD;  Location: [place] [facility];  Service: Cardiovascular;  Laterality: N/A;   LEFT HEART CATH AND CORONARY ANGIOGRAPHY N/A 07/07/2019   Procedure: LEFT HEART CATH AND CORONARY ANGIOGRAPHY;  Surgeon: Nelva Bush, MD;  Location: [place] [facility];  Service: Cardiovascular;  Laterality: N/A;   LITHOTRIPSY  2015   MELANOMA EXCISION  1980   malignant   NERVE SURGERY  2015   ulna nerve   TONSILLECTOMY  1945   WISDOM TOOTH EXTRACTION     Family History  Problem Relation Age of Onset   Hyperlipidemia Mother    Hypertension Mother    Heart disease Mother    Diabetes Mother    Heart attack Mother    Colon cancer Father        metasized to liver, adrenal, lungs   Lung cancer Father    Kidney cancer Father        malignant capsulated kidney tumor   Diabetes  Father    Liver cancer Father    Bladder Cancer Neg Hx    Prostate cancer Neg Hx     Allergies: Metformin and related, Azithromycin, Other, Percocet [oxycodone-acetaminophen], and Jardiance [empagliflozin] Current Outpatient Medications on File Prior to Visit  Medication Sig Dispense Refill   amiodarone (PACERONE) 200 MG tablet Take 1 tablet (200 mg total) by mouth daily. 90 tablet 1   amoxicillin-clavulanate (AUGMENTIN) 875-125 MG tablet Take 1 tablet by mouth 2 (two) times daily. 20 tablet 0   aspirin EC 81 MG tablet Take 1 tablet (81 mg total) by mouth daily. Swallow whole. (Patient taking differently: Take 81 mg by mouth daily with lunch. Swallow whole.) 90 tablet 3   benzonatate (TESSALON PERLES) 100 MG capsule 1-2 capsules up to twice daily as needed for cough 30 capsule 0   bisacodyl (DULCOLAX) 5 MG EC tablet Take 5 mg by mouth daily as needed for moderate constipation.     carvedilol (COREG) 3.125  MG tablet Take 1 tablet (3.125 mg total) by mouth 2 (two) times daily with a meal. 60 tablet 5   clopidogrel (PLAVIX) 75 MG tablet TAKE 1 TABLET(75 MG) BY MOUTH DAILY 30 tablet 3   docusate sodium (COLACE) 100 MG capsule Take 100 mg by mouth in the morning, at noon, and at bedtime.     ezetimibe (ZETIA) 10 MG tablet TAKE 1 TABLET(10 MG) BY MOUTH DAILY 90 tablet 2   famotidine (PEPCID) 20 MG tablet Take 20 mg by mouth daily with lunch.     fluticasone (FLONASE) 50 MCG/ACT nasal spray Place into both nostrils daily as needed for allergies or rhinitis.     gabapentin (NEURONTIN) 100 MG capsule Take 1 capsule (100 mg total) by mouth daily as needed. 90 capsule 3   lisinopril (ZESTRIL) 10 MG tablet TAKE 1 TABLET BY MOUTH DAILY 90 tablet 1   midodrine (PROAMATINE) 5 MG tablet Take 1 tablet (5 mg total) by mouth 2 (two) times daily as needed. Take when BP does drop less than 110 when standing. Additional dose at 2 pm if still dropping below 110 when standing 90 tablet 3   Multiple Vitamin  (MULTIVITAMIN) tablet Take 1 tablet by mouth daily with lunch.     Omega-3 Fatty Acids (FISH OIL) 1000 MG CAPS Take 1,000 mg by mouth daily with lunch.     Omega-3 Fatty Acids (FISH OIL) 500 MG CAPS Take 500 mg by mouth daily.     ONETOUCH VERIO test strip USE TO CHECK BLOOD SUGAR TWICE DAILY 100 strip 1   pantoprazole (PROTONIX) 40 MG tablet Take 1 tablet (40 mg total) by mouth daily. 90 tablet 3   psyllium (METAMUCIL) 58.6 % powder Take 1 packet by mouth at bedtime.     rosuvastatin (CRESTOR) 10 MG tablet Take 1 tablet (10 mg total) by mouth every other day. 90 tablet 1   sodium chloride (OCEAN) 0.65 % SOLN nasal spray Place 1 spray into both nostrils as needed for congestion.     spironolactone (ALDACTONE) 25 MG tablet TAKE 1 TABLET(25 MG) BY MOUTH DAILY 90 tablet 2   tamsulosin (FLOMAX) 0.4 MG CAPS capsule TAKE ONE CAPSULE BY MOUTH DAILY 90 capsule 3   traMADol (ULTRAM) 50 MG tablet TAKE 1 TABLET BY MOUTH EVERY 12 HOURS AS NEEDED 60 tablet 2   No current facility-administered medications on file prior to visit.    Social History   Tobacco Use   Smoking status: Some Days    Types: Pipe    Passive exposure: Current   Smokeless tobacco: Former    Quit date: 10/17/2015  Vaping Use   Vaping Use: Never used  Substance Use Topics   Alcohol use: Yes    Alcohol/week: 0.0 - 1.0 standard drinks of alcohol    Comment: OCCASIONALLY   Drug use: No    Review of Systems  Constitutional:  Negative for chills and fever.  HENT:  Positive for congestion and postnasal drip.   Respiratory:  Positive for cough. Negative for shortness of breath.   Cardiovascular:  Negative for chest pain and palpitations.  Gastrointestinal:  Negative for constipation, nausea and vomiting.      Objective:    BP 128/82 (BP Location: Left Arm, Patient Position: Sitting, Cuff Size: Normal)   Pulse (!) 56   Temp 98.1 F (36.7 C) (Oral)   Ht 6' (1.829 m)   Wt 209 lb 3.2 oz (94.9 kg)   SpO2 96%   BMI 28.37 kg/m  BP Readings from Last 3 Encounters:  11/28/21 128/82  10/16/21 113/74  10/16/21 110/62   Wt Readings from Last 3 Encounters:  11/28/21 209 lb 3.2 oz (94.9 kg)  10/16/21 213 lb (96.6 kg)  10/16/21 212 lb (96.2 kg)    Physical Exam Vitals reviewed.  Constitutional:      Appearance: He is well-developed.  HENT:     Head: Normocephalic and atraumatic.     Right Ear: Hearing, tympanic membrane, ear canal and external ear normal. No decreased hearing noted. No drainage, swelling or tenderness. No middle ear effusion. Tympanic membrane is not injected, erythematous or bulging.     Left Ear: Hearing, tympanic membrane, ear canal and external ear normal. No decreased hearing noted. No drainage, swelling or tenderness.  No middle ear effusion. Tympanic membrane is not injected, erythematous or bulging.     Nose: Nose normal.     Right Sinus: No maxillary sinus tenderness or frontal sinus tenderness.     Left Sinus: No maxillary sinus tenderness or frontal sinus tenderness.     Mouth/Throat:     Pharynx: Uvula midline. No oropharyngeal exudate or posterior oropharyngeal erythema.     Tonsils: No tonsillar abscesses.  Eyes:     Conjunctiva/sclera: Conjunctivae normal.  Cardiovascular:     Rate and Rhythm: Regular rhythm.     Heart sounds: Normal heart sounds.  Pulmonary:     Effort: Pulmonary effort is normal. No respiratory distress.     Breath sounds: Normal breath sounds. No wheezing, rhonchi or rales.     Comments: Coarse lung sounds bilateral lower lung fields Lymphadenopathy:     Head:     Right side of head: No submental, submandibular, tonsillar, preauricular, posterior auricular or occipital adenopathy.     Left side of head: No submental, submandibular, tonsillar, preauricular, posterior auricular or occipital adenopathy.     Cervical: No cervical adenopathy.  Skin:    General: Skin is warm and dry.  Neurological:     Mental Status: He is alert.  Psychiatric:         Speech: Speech normal.        Behavior: Behavior normal.        Assessment & Plan:   Problem List Items Addressed This Visit       Cardiovascular and Mediastinum   Essential hypertension    Chronic, stable.  Continue carvedilol 3.125 mg twice daily, lisinopril 10 mg daily, spironolactone 25 mg QD      Relevant Orders   POCT HgB A1C (Completed)     Respiratory   Bronchitis - Primary    No acute respiratory distress.  Patient has 3 more days of Augmentin and have advised him to complete Augmentin.  Advise a trial stop of Flonase and to start azelastine for postnasal drip.  Congestion is now clear and runny.  Pending chest x-ray to ensure no pneumonia which would warrant further antibiotics.  Patient will let me know how he is doing and certainly if symptoms persist may at that point changed to a different antibiotic.      Relevant Orders   DG Chest 2 View   Lung nodule    Discussed with patient, recommended CT chest done 10/2023.  Advised him to ask for order at that time or he may have consult with pulmonology at that time if he would prefer.         Endocrine   Controlled type 2 diabetes mellitus with complication, without long-term current use of insulin (  [place])    Lab Results  Component Value Date   HGBA1C 6.5 (A) 11/28/2021  Excellent control.  Continue Ozempic 1 mg which he is tolerating now.  It gave strict precautions in regards to constipation and most certainly if BM  were to change from his baseline, we would have to decrease or discontinue Ozempic.  Continue glipizide 5 mg with breakfast, 10 mg with lunch.  Counseled on monitoring for hypoglycemia and to ensure to take glipizide with a meal.  Patient verbalized understanding of all.       Relevant Medications   Semaglutide, 1 MG/DOSE, 4 MG/3ML SOPN     I have discontinued [name] "Ben"'s Ozempic (1 MG/DOSE) and metFORMIN. I am also having him start on Semaglutide (1 MG/DOSE). Additionally, I am having him  maintain his psyllium, sodium chloride, fluticasone, aspirin EC, midodrine, famotidine, multivitamin, Fish Oil, docusate sodium, carvedilol, spironolactone, ezetimibe, amiodarone, Fish Oil, bisacodyl, clopidogrel, gabapentin, rosuvastatin, tamsulosin, traMADol, OneTouch Verio, pantoprazole, lisinopril, benzonatate, and amoxicillin-clavulanate.   Meds ordered this encounter  Medications   Semaglutide, 1 MG/DOSE, 4 MG/3ML SOPN    Sig: Inject 1 mg as directed once a week.    Dispense:  3 mL    Refill:  3    Order Specific Question:   Supervising Provider    Answer:   Crecencio Mc [[id number]]    Return precautions given.   Risks, benefits, and alternatives of the medications and treatment plan prescribed today were discussed, and patient expressed understanding.   Education regarding symptom management and diagnosis given to patient on AVS.  Continue to follow with [name], [name] for routine health maintenance.   [name] and I agreed with plan.   [name], [name]

## 2021-11-28 NOTE — Patient Instructions (Addendum)
Stop flonase.  Start azelastine ( anti histamine) for sinus congestion  We will also obtain a chest x-ray  Complete augmentin.   Ensure to take probiotics while on antibiotics and also for 2 weeks after completion. This can either be by eating yogurt daily or taking a probiotic supplement over the counter such as Culturelle.It is important to re-colonize the gut with good bacteria and also to prevent any diarrheal infections associated with antibiotic use.    Let me know how you are doing

## 2021-11-28 NOTE — Assessment & Plan Note (Signed)
No acute respiratory distress.  Patient has 3 more days of Augmentin and have advised him to complete Augmentin.  Advise a trial stop of Flonase and to start azelastine for postnasal drip.  Congestion is now clear and runny.  Pending chest x-ray to ensure no pneumonia which would warrant further antibiotics.  Patient will let me know how he is doing and certainly if symptoms persist may at that point changed to a different antibiotic.

## 2021-11-28 NOTE — Assessment & Plan Note (Signed)
Lab Results  Component Value Date   HGBA1C 6.5 (A) 11/28/2021   Excellent control.  Continue Ozempic 1 mg which he is tolerating now.  It gave strict precautions in regards to constipation and most certainly if BM  were to change from his baseline, we would have to decrease or discontinue Ozempic.  Continue glipizide 5 mg with breakfast, 10 mg with lunch.  Counseled on monitoring for hypoglycemia and to ensure to take glipizide with a meal.  Patient verbalized understanding of all.

## 2021-11-28 NOTE — Assessment & Plan Note (Signed)
Chronic, stable.  Continue carvedilol 3.125 mg twice daily, lisinopril 10 mg daily, spironolactone 25 mg QD

## 2021-11-28 NOTE — Assessment & Plan Note (Signed)
Discussed with patient, recommended CT chest done 10/2023.  Advised him to ask for order at that time or he may have consult with pulmonology at that time if he would prefer.

## 2021-11-29 ENCOUNTER — Telehealth: Payer: Self-pay | Admitting: Family

## 2021-11-29 DIAGNOSIS — E1165 Type 2 diabetes mellitus with hyperglycemia: Secondary | ICD-10-CM

## 2021-12-03 NOTE — Telephone Encounter (Signed)
Refill sent patient is aware. 

## 2021-12-03 NOTE — Telephone Encounter (Signed)
Patient called about his Glibizide 10 mg, 30 supply. He requested a refill last week for.

## 2021-12-09 IMAGING — CR DG CHEST 2V
2 series · 2 of 2 positions shown · non-contrast
Comparison: None.

CLINICAL DATA: Cardiac device

EXAM:
CHEST - 2 VIEW

[chest pa]
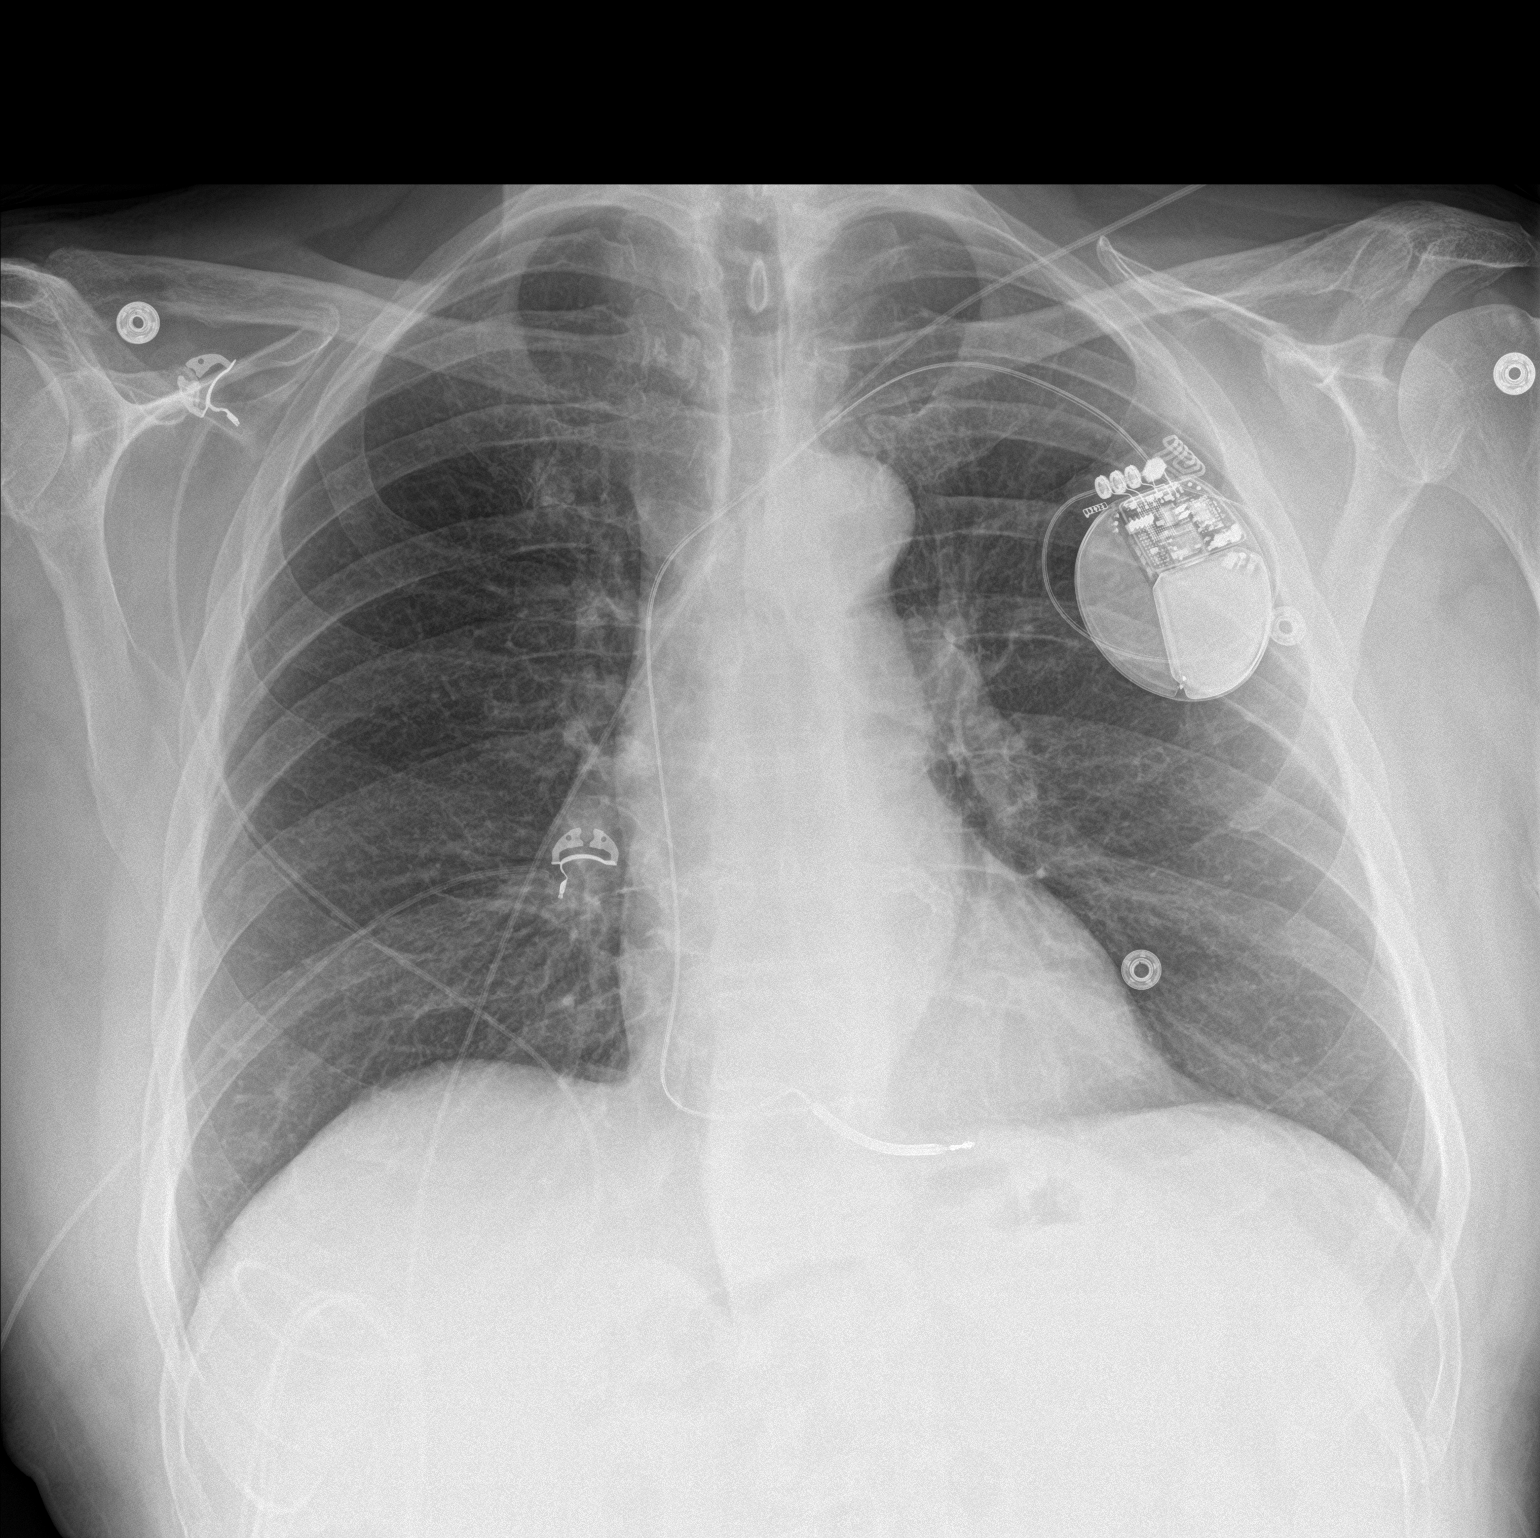

[chest lat]
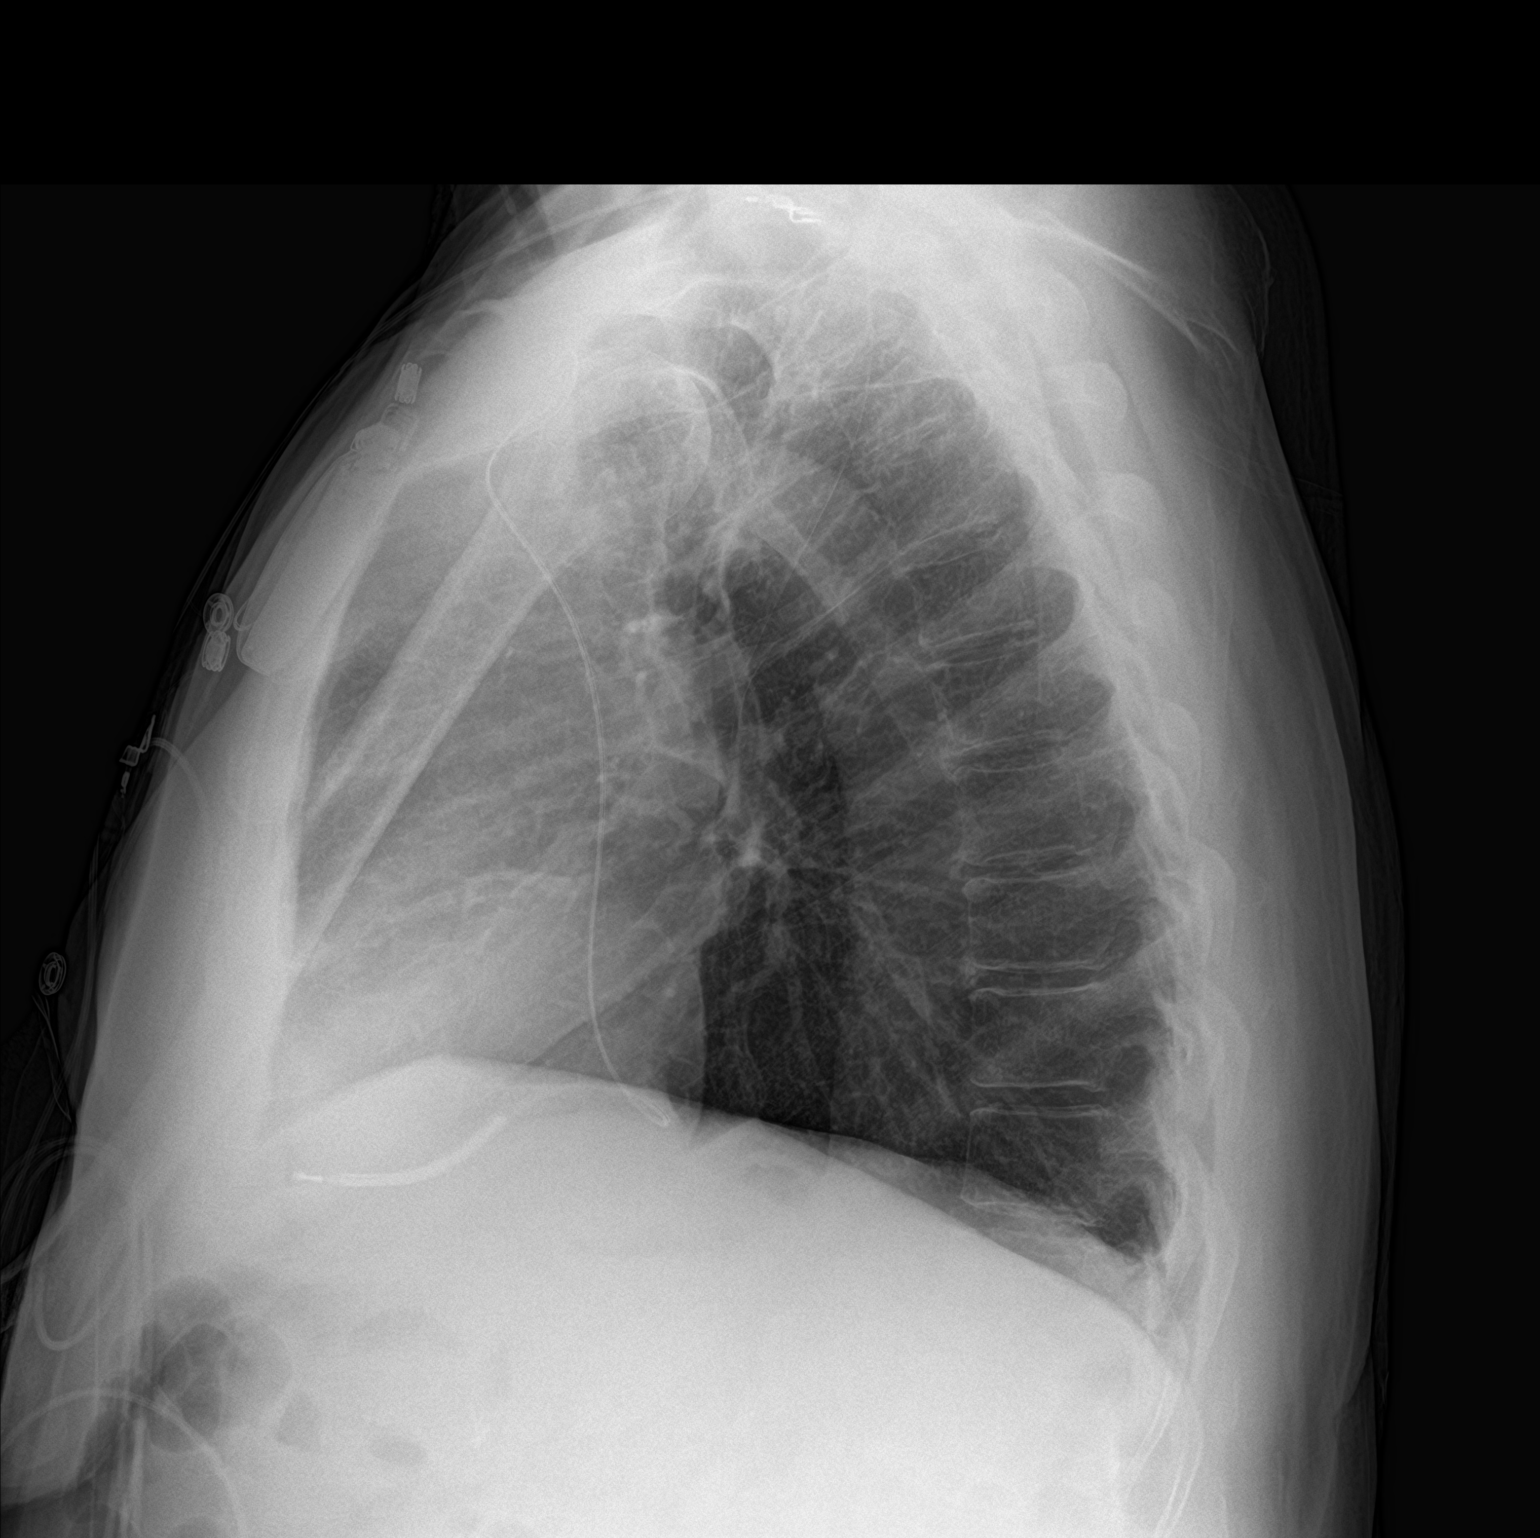

[2 of 2 positions shown; findings below may reference images not displayed]

FINDINGS: The heart size and mediastinal contours are within normal limits. A
subsided ICD seen with the lead tips in the right ventricle. Both
lungs are clear. The visualized skeletal structures are
unremarkable.
IMPRESSION: No active cardiopulmonary disease.

## 2021-12-10 ENCOUNTER — Other Ambulatory Visit: Payer: Self-pay

## 2021-12-10 MED ORDER — SPIRONOLACTONE 25 MG PO TABS: ORAL_TABLET | ORAL | 2 refills | Status: DC

## 2021-12-12 ENCOUNTER — Ambulatory Visit: Payer: Medicare Other | Admitting: Nurse Practitioner

## 2021-12-12 ENCOUNTER — Other Ambulatory Visit: Payer: Self-pay | Admitting: *Deleted

## 2021-12-12 ENCOUNTER — Encounter: Payer: Medicare Other | Admitting: Cardiology

## 2021-12-12 MED ORDER — AMIODARONE HCL 200 MG PO TABS: 200.0000 mg | ORAL_TABLET | Freq: Every day | ORAL | 3 refills | Status: DC

## 2021-12-13 ENCOUNTER — Encounter: Payer: Self-pay | Admitting: Podiatry

## 2021-12-13 ENCOUNTER — Ambulatory Visit: Payer: Medicare Other | Admitting: Podiatry

## 2021-12-13 DIAGNOSIS — B351 Tinea unguium: Secondary | ICD-10-CM | POA: Diagnosis not present

## 2021-12-13 DIAGNOSIS — E119 Type 2 diabetes mellitus without complications: Secondary | ICD-10-CM

## 2021-12-13 DIAGNOSIS — M79675 Pain in left toe(s): Secondary | ICD-10-CM

## 2021-12-13 DIAGNOSIS — M79674 Pain in right toe(s): Secondary | ICD-10-CM

## 2021-12-13 NOTE — Progress Notes (Signed)
This patient returns to my office for at risk foot care.  This patient requires this care by a professional since this patient will be at risk due to having diabetes,He had seen Dr.  Sherryle Lis for previous nail care.  This patient is unable to cut nails himself since the patient cannot reach his nails.These nails are painful walking and wearing shoes.  This patient presents for at risk foot care today.  General Appearance  Alert, conversant and in no acute stress.  Vascular  Dorsalis pedis and posterior tibial  pulses are palpable  bilaterally.  Capillary return is within normal limits  bilaterally. Temperature is within normal limits  bilaterally.  Neurologic  Senn-Weinstein monofilament wire test diminished  bilaterally. Muscle power within normal limits bilaterally.  Nails Thick disfigured discolored nails with subungual debris  from hallux to fifth toes bilaterally. No evidence of bacterial infection or drainage bilaterally. Marked curvature medial border right hallux.  Orthopedic  No limitations of motion  feet .  No crepitus or effusions noted.  No bony pathology or digital deformities noted. HAV  B/L  Skin  normotropic skin with no porokeratosis noted bilaterally.  No signs of infections or ulcers noted.     Onychomycosis  Pain in right toes  Pain in left toes  Consent was obtained for treatment procedures.   Mechanical debridement of nails 1-5  bilaterally performed with a nail nipper.  Filed with dremel without incident.    Return office visit    3 months                  Told patient to return for periodic foot care and evaluation due to potential at risk complications.   Gardiner Barefoot DPM

## 2021-12-14 ENCOUNTER — Other Ambulatory Visit: Payer: Self-pay

## 2021-12-14 MED ORDER — CLOPIDOGREL BISULFATE 75 MG PO TABS: ORAL_TABLET | ORAL | 8 refills | Status: DC

## 2021-12-25 NOTE — Progress Notes (Signed)
Remote ICD transmission.   

## 2022-01-11 ENCOUNTER — Other Ambulatory Visit: Payer: Self-pay | Admitting: Family

## 2022-01-11 DIAGNOSIS — G8929 Other chronic pain: Secondary | ICD-10-CM

## 2022-01-18 ENCOUNTER — Telehealth: Payer: Self-pay | Admitting: Pharmacy Technician

## 2022-01-18 DIAGNOSIS — Z596 Low income: Secondary | ICD-10-CM

## 2022-01-18 NOTE — Progress Notes (Signed)
[facility])                                            [place] Team    01/18/2022  [name] [name] 05-10-1940 [id number]  Care coordination call placed to [name] in regard to [place] application.  Spoke to [place] who informs patient is APPROVED 01/14/22-01/14/23. Patient's medication will be delivered to the prescriber's office based on last fill date in 2023.  Tashayla Therien P. Liridona Mashaw, [name]  [phone]

## 2022-01-21 DIAGNOSIS — H6123 Impacted cerumen, bilateral: Secondary | ICD-10-CM | POA: Diagnosis not present

## 2022-01-21 DIAGNOSIS — H903 Sensorineural hearing loss, bilateral: Secondary | ICD-10-CM | POA: Diagnosis not present

## 2022-01-23 ENCOUNTER — Ambulatory Visit: Payer: Medicare Other | Admitting: Dermatology

## 2022-02-01 ENCOUNTER — Telehealth: Payer: Self-pay

## 2022-02-01 NOTE — Telephone Encounter (Signed)
Telephone note

## 2022-02-01 NOTE — Telephone Encounter (Signed)
Patient picked up patient assistance Medication (Ozempic)

## 2022-02-08 ENCOUNTER — Telehealth: Payer: Self-pay

## 2022-02-08 NOTE — Telephone Encounter (Signed)
Spoke with patient, informed him that I had reviewed his transmission and there were no episodes, device functioning normally , no episodes, patient stated that he has some upper respiratory infection that may be part of it, patent appreciative of call back

## 2022-02-08 NOTE — Telephone Encounter (Signed)
Patient called in stating he is having some pain at icd site and wants to know if anything was seen on his transmission

## 2022-02-09 ENCOUNTER — Emergency Department
Admission: EM | Admit: 2022-02-09 | Discharge: 2022-02-09 | Disposition: A | Discharge status: Home / Self Care | Payer: Medicare Other | Attending: Emergency Medicine | Admitting: Emergency Medicine

## 2022-02-09 ENCOUNTER — Ambulatory Visit: Admit: 2022-02-09 | Payer: Medicare Other

## 2022-02-09 ENCOUNTER — Emergency Department: Payer: Medicare Other

## 2022-02-09 DIAGNOSIS — Z1152 Encounter for screening for COVID-19: Secondary | ICD-10-CM | POA: Insufficient documentation

## 2022-02-09 DIAGNOSIS — I2510 Atherosclerotic heart disease of native coronary artery without angina pectoris: Secondary | ICD-10-CM | POA: Diagnosis not present

## 2022-02-09 DIAGNOSIS — R0789 Other chest pain: Secondary | ICD-10-CM

## 2022-02-09 DIAGNOSIS — J9811 Atelectasis: Secondary | ICD-10-CM | POA: Diagnosis not present

## 2022-02-09 DIAGNOSIS — I10 Essential (primary) hypertension: Secondary | ICD-10-CM | POA: Diagnosis not present

## 2022-02-09 DIAGNOSIS — M791 Myalgia, unspecified site: Secondary | ICD-10-CM | POA: Insufficient documentation

## 2022-02-09 DIAGNOSIS — I700 Atherosclerosis of aorta: Secondary | ICD-10-CM | POA: Diagnosis not present

## 2022-02-09 DIAGNOSIS — R079 Chest pain, unspecified: Secondary | ICD-10-CM | POA: Diagnosis not present

## 2022-02-09 DIAGNOSIS — J069 Acute upper respiratory infection, unspecified: Secondary | ICD-10-CM | POA: Insufficient documentation

## 2022-02-09 DIAGNOSIS — Z95810 Presence of automatic (implantable) cardiac defibrillator: Secondary | ICD-10-CM | POA: Diagnosis not present

## 2022-02-09 LAB — TROPONIN I (HIGH SENSITIVITY)
Troponin I (High Sensitivity): 14 ng/L (ref <18)
Troponin I (High Sensitivity): 13 ng/L (ref <18)

## 2022-02-09 LAB — CBC WITH DIFFERENTIAL/PLATELET
Abs Immature Granulocytes: 0.01 10*3/uL (ref 0.00–0.07)
Basophils Absolute: 0 10*3/uL (ref 0.0–0.1)
Basophils Relative: 1 %
Eosinophils Absolute: 0.1 10*3/uL (ref 0.0–0.5)
Eosinophils Relative: 1 %
HCT: 41.1 % (ref 39.0–52.0)
Hemoglobin: 14.1 g/dL (ref 13.0–17.0)
Immature Granulocytes: 0 %
Lymphocytes Relative: 25 %
Lymphs Abs: 1.5 10*3/uL (ref 0.7–4.0)
MCH: 31.8 pg (ref 26.0–34.0)
MCHC: 34.3 g/dL (ref 30.0–36.0)
MCV: 92.6 fL (ref 80.0–100.0)
Monocytes Absolute: 0.5 10*3/uL (ref 0.1–1.0)
Monocytes Relative: 8 %
Neutro Abs: 4 10*3/uL (ref 1.7–7.7)
Neutrophils Relative %: 65 %
Platelets: 160 10*3/uL (ref 150–400)
RBC: 4.44 MIL/uL (ref 4.22–5.81)
RDW: 14.1 % (ref 11.5–15.5)
WBC: 6 10*3/uL (ref 4.0–10.5)
nRBC: 0 % (ref 0.0–0.2)

## 2022-02-09 LAB — BRAIN NATRIURETIC PEPTIDE: B Natriuretic Peptide: 82.8 pg/mL (ref 0.0–100.0)

## 2022-02-09 LAB — RESP PANEL BY RT-PCR (RSV, FLU A&B, COVID)  RVPGX2
Influenza A by PCR: NEGATIVE
Influenza B by PCR: NEGATIVE
Resp Syncytial Virus by PCR: NEGATIVE
SARS Coronavirus 2 by RT PCR: NEGATIVE

## 2022-02-09 LAB — BASIC METABOLIC PANEL
Anion gap: 11 (ref 5–15)
BUN: 15 mg/dL (ref 8–23)
CO2: 20 mmol/L — ABNORMAL LOW (ref 22–32)
Calcium: 8.5 mg/dL — ABNORMAL LOW (ref 8.9–10.3)
Chloride: 101 mmol/L (ref 98–111)
Creatinine, Ser: 0.93 mg/dL (ref 0.61–1.24)
GFR, Estimated: >60 mL/min (ref >60)
Glucose, Bld: 213 mg/dL — ABNORMAL HIGH (ref 70–99)
Potassium: 4.3 mmol/L (ref 3.5–5.1)
Sodium: 132 mmol/L — ABNORMAL LOW (ref 135–145)

## 2022-02-09 MED ORDER — IOHEXOL 350 MG/ML SOLN
75.0000 mL | Freq: Once | INTRAVENOUS | Status: AC | PRN
  Administered 2022-02-09: 75 mL via INTRAVENOUS

## 2022-02-09 NOTE — Discharge Instructions (Signed)
Take Tylenol 650 mg every 6 hours for aches, pain, fever.  Your tests today showed no signs of bacterial pneumonia that would require antibiotics and your heart tests were normal.  Please see your doctor for a follow-up visit this week.  Thank you for choosing [place] for your health care today!  Please see your primary doctor this week for a follow up appointment.   Sometimes, in the early stages of certain disease courses it is difficult to detect in the emergency department evaluation -- so, it is important that you continue to monitor your symptoms and call your doctor right away or return to the emergency department if you develop any new or worsening symptoms.  Please go to the following website to schedule new (and existing) patient appointments:   [email or web address]  If you do not have a primary doctor try calling the following clinics to establish care:  If you have insurance:  [facility] [phone] [place] [address]   [facility]  [phone] [place] [place] [address]   If you do not have insurance:  [facility]  [phone] [address], [place] [address]   The following is another list of primary care offices in the area who are accepting new patients at this time.  Please reach out to one of them directly and let them know you would like to schedule an appointment to follow up on an Emergency Department visit, and/or to establish a new primary care provider (PCP).  There are likely other primary care clinics in the are who are accepting new patients, but this is an excellent place to start:  [place] physician: Dr Lavon Paganini [address] [place], [place] [address] [phone]  [facility] Lead Physician: Dr Steele Sizer [address], [place], [place] [address] [phone]  [place] Physician: Dr Park Liter [address] [place], [place] [address] [phone]  [facility] Lead Physician: Dr Dewaine Oats [place], [place], [place] [address] [phone]  [facility] Physician: Dr Halina Maidens [address] [place] Broach [place], [place] [address] [phone]   It was my pleasure to care for you today.   Hoover Brunette Jacelyn Grip, MD

## 2022-02-09 NOTE — ED Triage Notes (Signed)
Patient presents with chest pain that radiates down his LEFT arm that began 3 days ago; Does have ICD/Pacer; Also reports "bringing up" a lot of phlegm, says the only time he coughs is when he's standing up from laying down

## 2022-02-09 NOTE — ED Provider Notes (Signed)
[facility] Provider Note    Event Date/Time   First MD Initiated Contact with Patient 02/09/22 1050     (approximate)   History   Chest Pain (Patient presents with chest pain that radiates down his LEFT arm that began 3 days ago; Does have ICD/Pacer; Also reports "bringing up" a lot of phlegm, says the only time he coughs is when he's standing up from laying down)   HPI  [name] [name] is a 82 y.o. male   Past medical history of CAD, hypertension, hyperlipidemia, who presents to the emergency department with cough, myalgia, nasal congestion and transient chest pain.  This has been going on for 3 days.  Chest pain is described as sharp lasting several seconds and could affect his left shoulder, left chest the right chest.  Nonexertional.  No chest pain today.  Denies nausea vomiting diarrhea, GI or GU symptoms.  Independent Historian contributed to assessment above: Wife who is at bedside      Physical Exam   Triage Vital Signs: ED Triage Vitals  Enc Vitals Group     BP 02/09/22 1041 114/71     Pulse Rate 02/09/22 1041 72     Resp 02/09/22 1041 20     Temp 02/09/22 1041 99.7 F (37.6 C)     Temp Source 02/09/22 1041 Oral     SpO2 02/09/22 1041 95 %     Weight 02/09/22 1037 198 lb (89.8 kg)     Height 02/09/22 1037 6' (1.829 m)     Head Circumference --      Peak Flow --      Pain Score 02/09/22 1037 0     Pain Loc --      Pain Edu? --      Excl. in [place]? --     Most recent vital signs: Vitals:   02/09/22 1100 02/09/22 1300  BP: 131/81 137/73  Pulse: 70 64  Resp: 18 18  Temp:    SpO2: 94% 95%    General: Awake, no distress.  CV:  Good peripheral perfusion.  Resp:  Normal effort.  Abd:  No distention.  Other:  Awake alert oriented hemodynamics appropriate reassuring no hypoxia no respiratory distress, lungs clear to auscultation bilaterally without focalities or wheezing abdomen soft nontender skin appears euvolemic  well-perfused   ED Results / Procedures / Treatments   Labs (all labs ordered are listed, but only abnormal results are displayed) Labs Reviewed  BASIC METABOLIC PANEL - Abnormal; Notable for the following components:      Result Value   Sodium 132 (*)    CO2 20 (*)    Glucose, Bld 213 (*)    Calcium 8.5 (*)    All other components within normal limits  RESP PANEL BY RT-PCR (RSV, FLU A&B, COVID)  [id number]  CBC WITH DIFFERENTIAL/PLATELET  BRAIN NATRIURETIC PEPTIDE  TROPONIN I (HIGH SENSITIVITY)  TROPONIN I (HIGH SENSITIVITY)     I ordered and reviewed the above labs they are notable for trops 17/18 viral swabs negative for flu, COVID or RSV  EKG  ED ECG REPORT I, Lucillie Garfinkel, the attending physician, personally viewed and interpreted this ECG.   Date: 02/09/2022  EKG Time: 1034  Rate: 75  Rhythm: Paced rhythm  Axis: nl  Intervals:none  ST&T Change: paced Rhythm no ischemic changes    RADIOLOGY I independently reviewed and interpreted chest x-ray and see no obvious focalities pneumothorax   PROCEDURES:  Critical Care performed: No  Procedures   MEDICATIONS ORDERED IN ED: Medications  iohexol (OMNIPAQUE) 350 MG/ML injection 75 mL (75 mLs Intravenous Contrast Given 02/09/22 1237)     IMPRESSION / MDM / ASSESSMENT AND PLAN / ED COURSE  I reviewed the triage vital signs and the nursing notes.                                Patient's presentation is most consistent with acute presentation with potential threat to life or bodily function.  Differential diagnosis includes, but is not limited to, respiratory infection, viral URI, bronchitis, bacterial pneumonia or sepsis, PE, ACS   The patient is on the cardiac monitor to evaluate for evidence of arrhythmia and/or significant heart rate changes.  MDM: Patient with symptoms most consistent with viral URI and transient chest pain which would be very atypical for ACS.  Given his cardiac history will get EKG which  fortunately looks nonischemic and serial troponins have been flat.  No recurrent chest pain while in the emergency department nor today, viral panel negative and chest x-ray and CT of the chest was negative for acute cardiopulmonary pathologies.  Patient has been stable, most likely a viral illness, given anticipatory guidance and he will follow-up with PMD and return to the emergency department with any new or worsening symptoms.  I considered hospitalization for admission or observation but given workup negative as above and patient without recurrent chest pain and symptoms most likely due to viral URI, having outpatient management and follow-up at this time is most appropriate, patient and family in agreement.        FINAL CLINICAL IMPRESSION(S) / ED DIAGNOSES   Final diagnoses:  Upper respiratory infection with cough and congestion  Atypical chest pain     Rx / DC Orders   ED Discharge Orders     None        Note:  This document was prepared using Dragon voice recognition software and may include unintentional dictation errors.    Lucillie Garfinkel, MD 02/09/22 1355

## 2022-02-11 ENCOUNTER — Ambulatory Visit (INDEPENDENT_AMBULATORY_CARE_PROVIDER_SITE_OTHER): Payer: Medicare Other | Admitting: Family

## 2022-02-11 ENCOUNTER — Encounter: Payer: Self-pay | Admitting: Family

## 2022-02-11 VITALS — BP 126/78 | HR 78 | Temp 98.1°F | Ht 72.0 in | Wt 210.8 lb

## 2022-02-11 DIAGNOSIS — E118 Type 2 diabetes mellitus with unspecified complications: Secondary | ICD-10-CM | POA: Diagnosis not present

## 2022-02-11 DIAGNOSIS — M545 Low back pain, unspecified: Secondary | ICD-10-CM

## 2022-02-11 DIAGNOSIS — G8929 Other chronic pain: Secondary | ICD-10-CM | POA: Diagnosis not present

## 2022-02-11 DIAGNOSIS — M791 Myalgia, unspecified site: Secondary | ICD-10-CM

## 2022-02-11 MED ORDER — SEMAGLUTIDE (1 MG/DOSE) 4 MG/3ML ~~LOC~~ SOPN: 0.5000 mg | PEN_INJECTOR | SUBCUTANEOUS | 3 refills | Status: DC

## 2022-02-11 NOTE — Assessment & Plan Note (Signed)
Unsure etiology at this time.  Reviewed emergency room visit.  Fortunately patient has improved today. Continued neck, low back and neck pain.  Question if viral in nature as he had URI symptoms.  Negative for COVID flu and RSV . Alternatively we discussed low back pain particularly as it relates to lower extremity weakness, leg pain.  We discussed use of rosuvastatin 40 mg. He is using every other day. Discussed risk of statin myositis. Pending CK, hepatic enzymes.

## 2022-02-11 NOTE — Assessment & Plan Note (Signed)
Acute on chronic.  Advised to take Tylenol arthritis.  He is taking gabapentin 100 mg nightly for restless leg syndrome, also low back pain.  Pending x-rays at this time.

## 2022-02-11 NOTE — Patient Instructions (Signed)
Decrease Ozempic to 0.5 mg to see if constipation resolves.    You may take Tylenol arthritis for joint pain.  You may use gabapentin 100 mg at bedtime

## 2022-02-11 NOTE — Progress Notes (Signed)
Assessment & Plan:  Myalgia Assessment & Plan: Unsure etiology at this time.  Reviewed emergency room visit.  Fortunately patient has improved today. Continued neck, low back and neck pain.  Question if viral in nature as he had URI symptoms.  Negative for COVID flu and RSV . Alternatively we discussed low back pain particularly as it relates to lower extremity weakness, leg pain.  We discussed use of rosuvastatin 40 mg. He is using every other day. Discussed risk of statin myositis. Pending CK, hepatic enzymes.   Orders: -     CK; Future -     Comprehensive metabolic panel; Future -     DG HIPS BILAT W OR W/O PELVIS 3-4 VIEWS; Future -     DG Lumbar Spine Complete; Future -     B12 and Folate Panel; Future -     DG Cervical Spine Complete; Future -     TSH; Future  Chronic low back pain, unspecified back pain laterality, unspecified whether sciatica present Assessment & Plan: Acute on chronic.  Advised to take Tylenol arthritis.  He is taking gabapentin 100 mg nightly for restless leg syndrome, also low back pain.  Pending x-rays at this time.  Orders: -     DG HIPS BILAT W OR W/O PELVIS 3-4 VIEWS; Future -     DG Lumbar Spine Complete; Future -     B12 and Folate Panel; Future  Controlled type 2 diabetes mellitus with complication, without long-term current use of insulin (HCC) -     Semaglutide (1 MG/DOSE); Inject 0.5 mg as directed once a week.  Dispense: 3 mL; Refill: 3     Return precautions given.   Risks, benefits, and alternatives of the medications and treatment plan prescribed today were discussed, and patient expressed understanding.   Education regarding symptom management and diagnosis given to patient on AVS either electronically or printed.  Return in about 2 weeks (around 02/25/2022).  [name], [name]  Subjective:    Patient ID: [name] [name], male    DOB: [birth year], 82 y.o.   MRN: [id number]  CC: [name] [name] is a 82 y.o. male who presents  today for follow up.   HPI: Accompanied by wife     Complains body pain and leg weakness. Symptoms starting with  'aching' x 4 days ago, improved today.   Pain is most prominent in bilateral legs, posterior neck  pain, low back pain.    He reduced crestor '10mg'$  to QOD.   Pain would be sharp and now has become dull, worse after he has been up for a 'while' and moving.   When dog climbs on legs, he feels an ache in his thigh. Yesterday was extremely hard to get out of the chair. Chronic distal neuropathy his feet. He is using walker.   He continues to have productive cough, nasal congestion He has used saline spray, netti pot once .  No sore throat, fever, chills, hip pain.   Symptoms started with sharp pain which he noted with more 'frequency' in his left arm so he reached out to cardiology 3 days ago. He downloaded ICD. Call note read no events on ICD 02/08/22.   Seen in ED 02/09/22 BNP 82 Troponin x 2, negative Negative covid, flu, rsv WBC 6 CT angio performed without evidence of pulmonary embolism.  No pericardial effusion.  Extensive coronary artery calcifications.  Stable 5 mm groundglass opacity right lower lobe.  Follow-up CT chest in 2 years History of CAD,  cardiomyopathy, VT, NSTEMI  He has ICD. Last remote transmission 11/27/2021 He follows with Dr Quentin Ore  He has  suspended ozempic '1mg'$  due to constipation. FBG 110-120.   RLS- he has been using gabapentin '100mg'$  qhs.     Allergies: Metformin and related, Azithromycin, Other, Percocet [oxycodone-acetaminophen], and Jardiance [empagliflozin] Current Outpatient Medications on File Prior to Visit  Medication Sig Dispense Refill   amiodarone (PACERONE) 200 MG tablet Take 1 tablet (200 mg total) by mouth daily. 90 tablet 3   aspirin EC 81 MG tablet Take 1 tablet (81 mg total) by mouth daily. Swallow whole. (Patient taking differently: Take 81 mg by mouth daily with lunch. Swallow whole.) 90 tablet 3   bisacodyl (DULCOLAX)  5 MG EC tablet Take 5 mg by mouth daily as needed for moderate constipation.     carvedilol (COREG) 3.125 MG tablet Take 1 tablet (3.125 mg total) by mouth 2 (two) times daily with a meal. 60 tablet 5   clopidogrel (PLAVIX) 75 MG tablet TAKE 1 TABLET(75 MG) BY MOUTH DAILY 30 tablet 8   docusate sodium (COLACE) 100 MG capsule Take 100 mg by mouth in the morning, at noon, and at bedtime.     ezetimibe (ZETIA) 10 MG tablet TAKE 1 TABLET(10 MG) BY MOUTH DAILY 90 tablet 2   famotidine (PEPCID) 20 MG tablet Take 20 mg by mouth daily with lunch.     fluticasone (FLONASE) 50 MCG/ACT nasal spray Place into both nostrils daily as needed for allergies or rhinitis.     gabapentin (NEURONTIN) 100 MG capsule Take 1 capsule (100 mg total) by mouth daily as needed. 90 capsule 3   glipiZIDE (GLUCOTROL) 10 MG tablet TAKE 1/2 TABLET BY MOUTH EVERY MORNING WITH BREAKFAST AND 1 TABLET BY MOUTH EVERY EVENING WITH SUPPER 120 tablet 3   lisinopril (ZESTRIL) 10 MG tablet TAKE 1 TABLET BY MOUTH DAILY 90 tablet 1   midodrine (PROAMATINE) 5 MG tablet Take 1 tablet (5 mg total) by mouth 2 (two) times daily as needed. Take when BP does drop less than 110 when standing. Additional dose at 2 pm if still dropping below 110 when standing 90 tablet 3   Multiple Vitamin (MULTIVITAMIN) tablet Take 1 tablet by mouth daily with lunch.     Omega-3 Fatty Acids (FISH OIL) 1000 MG CAPS Take 1,000 mg by mouth daily with lunch.     Omega-3 Fatty Acids (FISH OIL) 500 MG CAPS Take 500 mg by mouth daily.     ONETOUCH VERIO test strip USE TO CHECK BLOOD SUGAR TWICE DAILY 100 strip 1   pantoprazole (PROTONIX) 40 MG tablet Take 1 tablet (40 mg total) by mouth daily. 90 tablet 3   psyllium (METAMUCIL) 58.6 % powder Take 1 packet by mouth at bedtime.     rosuvastatin (CRESTOR) 10 MG tablet Take 1 tablet (10 mg total) by mouth every other day. 90 tablet 1   sodium chloride (OCEAN) 0.65 % SOLN nasal spray Place 1 spray into both nostrils as needed for  congestion.     spironolactone (ALDACTONE) 25 MG tablet TAKE 1 TABLET(25 MG) BY MOUTH DAILY 90 tablet 2   tamsulosin (FLOMAX) 0.4 MG CAPS capsule TAKE ONE CAPSULE BY MOUTH DAILY 90 capsule 3   traMADol (ULTRAM) 50 MG tablet TAKE 1 TABLET BY MOUTH EVERY 12 HOURS AS NEEDED 60 tablet 2   No current facility-administered medications on file prior to visit.    Review of Systems  Constitutional:  Positive for fatigue. Negative for  chills and fever.  HENT:  Positive for congestion.   Respiratory:  Negative for cough.   Cardiovascular:  Negative for chest pain and palpitations.  Gastrointestinal:  Positive for constipation. Negative for nausea and vomiting.  Musculoskeletal:  Positive for back pain and myalgias.  Neurological:  Positive for weakness and numbness. Negative for dizziness and headaches.      Objective:    BP 126/78   Pulse 78   Temp 98.1 F (36.7 C) (Oral)   Ht 6' (1.829 m)   Wt 210 lb 12.8 oz (95.6 kg)   SpO2 98%   BMI 28.59 kg/m  BP Readings from Last 3 Encounters:  02/11/22 126/78  02/09/22 137/73  11/28/21 128/82   Wt Readings from Last 3 Encounters:  02/11/22 210 lb 12.8 oz (95.6 kg)  02/09/22 198 lb (89.8 kg)  11/28/21 209 lb 3.2 oz (94.9 kg)    Physical Exam Vitals reviewed.  Constitutional:      Appearance: He is well-developed.  Cardiovascular:     Rate and Rhythm: Regular rhythm.     Heart sounds: Normal heart sounds.  Pulmonary:     Effort: Pulmonary effort is normal. No respiratory distress.     Breath sounds: Normal breath sounds. No wheezing or rales.  Musculoskeletal:     Lumbar back: No swelling, spasms or tenderness. Normal range of motion. Negative right straight leg raise test and negative left straight leg raise test.       Back:     Comments: Full range of motion with flexion, extension, lateral side bends. No pain, numbness, tingling elicited with single leg raise bilaterally. No rash. Wide stance when walking across room. Steady.   No limp or waddling gait.  No pain with deep palpation of greater trochanter.     Skin:    General: Skin is warm and dry.  Neurological:     Mental Status: He is alert.     Comments:  LLE 4/5, RLE 5/5  Psychiatric:        Speech: Speech normal.        Behavior: Behavior normal.

## 2022-02-12 ENCOUNTER — Ambulatory Visit (INDEPENDENT_AMBULATORY_CARE_PROVIDER_SITE_OTHER): Payer: Medicare Other

## 2022-02-12 ENCOUNTER — Encounter: Payer: Self-pay | Admitting: Family

## 2022-02-12 ENCOUNTER — Other Ambulatory Visit (INDEPENDENT_AMBULATORY_CARE_PROVIDER_SITE_OTHER): Payer: Medicare Other

## 2022-02-12 ENCOUNTER — Other Ambulatory Visit: Payer: Self-pay | Admitting: Family

## 2022-02-12 DIAGNOSIS — M545 Low back pain, unspecified: Secondary | ICD-10-CM

## 2022-02-12 DIAGNOSIS — R531 Weakness: Secondary | ICD-10-CM | POA: Diagnosis not present

## 2022-02-12 DIAGNOSIS — M791 Myalgia, unspecified site: Secondary | ICD-10-CM

## 2022-02-12 DIAGNOSIS — M542 Cervicalgia: Secondary | ICD-10-CM | POA: Diagnosis not present

## 2022-02-12 DIAGNOSIS — R899 Unspecified abnormal finding in specimens from other organs, systems and tissues: Secondary | ICD-10-CM

## 2022-02-12 DIAGNOSIS — G8929 Other chronic pain: Secondary | ICD-10-CM

## 2022-02-12 DIAGNOSIS — M4316 Spondylolisthesis, lumbar region: Secondary | ICD-10-CM | POA: Diagnosis not present

## 2022-02-12 LAB — B12 AND FOLATE PANEL
Folate: 20.5 ng/mL (ref >5.9)
Vitamin B-12: 749 pg/mL (ref 211–911)

## 2022-02-12 LAB — CK: Total CK: 72 U/L (ref 7–232)

## 2022-02-12 LAB — COMPREHENSIVE METABOLIC PANEL
ALT: 38 U/L (ref 0–53)
AST: 42 U/L — ABNORMAL HIGH (ref 0–37)
Albumin: 3.8 g/dL (ref 3.5–5.2)
Alkaline Phosphatase: 75 U/L (ref 39–117)
BUN: 17 mg/dL (ref 6–23)
CO2: 24 mEq/L (ref 19–32)
Calcium: 8.8 mg/dL (ref 8.4–10.5)
Chloride: 104 mEq/L (ref 96–112)
Creatinine, Ser: 0.91 mg/dL (ref 0.40–1.50)
GFR: 78.73 mL/min (ref >60.00)
Glucose, Bld: 148 mg/dL — ABNORMAL HIGH (ref 70–99)
Potassium: 4.1 mEq/L (ref 3.5–5.1)
Sodium: 138 mEq/L (ref 135–145)
Total Bilirubin: 1.5 mg/dL — ABNORMAL HIGH (ref 0.2–1.2)
Total Protein: 7.1 g/dL (ref 6.0–8.3)

## 2022-02-12 LAB — TSH: TSH: 5.81 u[IU]/mL — ABNORMAL HIGH (ref 0.35–5.50)

## 2022-02-15 ENCOUNTER — Other Ambulatory Visit: Payer: Self-pay | Admitting: Family

## 2022-02-15 DIAGNOSIS — G8929 Other chronic pain: Secondary | ICD-10-CM

## 2022-02-15 NOTE — Telephone Encounter (Signed)
Can we burn cd of xr lumbar and bilateral  hips for patient?   Do you mind sending him a mychart once ready?

## 2022-02-20 ENCOUNTER — Telehealth: Payer: Self-pay | Admitting: *Deleted

## 2022-02-20 NOTE — Telephone Encounter (Signed)
     Patient  visit on 02/09/2022 at [facility] was for treatment   No answer  1st call   [place] [phone] [address] , [place] [address] Email : [email or web address]

## 2022-02-22 ENCOUNTER — Other Ambulatory Visit: Payer: Self-pay | Admitting: Cardiovascular Disease

## 2022-02-26 ENCOUNTER — Ambulatory Visit: Payer: Medicare Other

## 2022-02-26 DIAGNOSIS — I255 Ischemic cardiomyopathy: Secondary | ICD-10-CM | POA: Diagnosis not present

## 2022-02-26 LAB — CUP PACEART REMOTE DEVICE CHECK
Battery Remaining Longevity: 126 mo
Battery Remaining Percentage: 100 %
Brady Statistic RA Percent Paced: 7 %
Brady Statistic RV Percent Paced: 97 %
Date Time Interrogation Session: 20240213002100
HighPow Impedance: 81 Ohm
Implantable Lead Connection Status: 753985
Implantable Lead Connection Status: 753985
Implantable Lead Connection Status: 753985
Implantable Lead Implant Date: 20220202
Implantable Lead Implant Date: 20230213
Implantable Lead Implant Date: 20230213
Implantable Lead Location: 753858
Implantable Lead Location: 753859
Implantable Lead Location: 753860
Implantable Lead Model: 273
Implantable Lead Model: 4671
Implantable Lead Model: 7841
Implantable Lead Serial Number: 111255
Implantable Lead Serial Number: 1233258
Implantable Lead Serial Number: 861310
Implantable Pulse Generator Implant Date: 20230213
Lead Channel Impedance Value: 400 Ohm
Lead Channel Impedance Value: 527 Ohm
Lead Channel Impedance Value: 573 Ohm
Lead Channel Pacing Threshold Amplitude: 0.9 V
Lead Channel Pacing Threshold Pulse Width: 0.4 ms
Lead Channel Setting Pacing Amplitude: 1.5 V
Lead Channel Setting Pacing Amplitude: 2 V
Lead Channel Setting Pacing Amplitude: 2.2 V
Lead Channel Setting Pacing Pulse Width: 0.4 ms
Lead Channel Setting Pacing Pulse Width: 1 ms
Lead Channel Setting Sensing Sensitivity: 0.6 mV
Lead Channel Setting Sensing Sensitivity: 1 mV
Pulse Gen Serial Number: 279753

## 2022-02-27 ENCOUNTER — Encounter: Payer: Self-pay | Admitting: Family

## 2022-02-27 ENCOUNTER — Ambulatory Visit (INDEPENDENT_AMBULATORY_CARE_PROVIDER_SITE_OTHER): Payer: Medicare Other | Admitting: Family

## 2022-02-27 VITALS — BP 124/80 | HR 65 | Temp 97.6°F | Ht 72.0 in | Wt 209.6 lb

## 2022-02-27 DIAGNOSIS — M791 Myalgia, unspecified site: Secondary | ICD-10-CM

## 2022-02-27 DIAGNOSIS — M545 Low back pain, unspecified: Secondary | ICD-10-CM | POA: Diagnosis not present

## 2022-02-27 DIAGNOSIS — G8929 Other chronic pain: Secondary | ICD-10-CM | POA: Diagnosis not present

## 2022-02-27 NOTE — Progress Notes (Signed)
Assessment & Plan:  Myalgia Assessment & Plan: Fortunately completely resolved.   Chronic low back pain, unspecified back pain laterality, unspecified whether sciatica present Assessment & Plan: Chronic, overall stable.  Awaiting orthopedic consult.  Continue tramadol 58m BID , prn use tylenol arthritis.       Return precautions given.   Risks, benefits, and alternatives of the medications and treatment plan prescribed today were discussed, and patient expressed understanding.   Education regarding symptom management and diagnosis given to patient on AVS either electronically or printed.  Return in about 3 months (around 05/28/2022) for Medicare Wellness upcoming or due, schedule.  MMable Paris [name]  Subjective:    Patient ID: BAntwand Arostegui male    DOB: [birth year] 82y.o.   MRN: [id number] CC: BRilen Jerauldis a 82y.o. male who presents today for follow up.   HPI: 2-week follow-up from idiopathic myalgia.  Suspected viral at that time.  Weakness, fatigue resolved 3 days after onset.  Neuropathy in feet is unchanged.  Cough has resolved.    Low back pain will be become bothersome with long periods of standing. He has learned to flex forward with some relief of pain and feels most 'comfortable' for him.  Pain improves with stretching exercises at home.  He will takes tramadol 541mBID. He will take tylenol for 'flare up' with relief. He is not using gabapentin.   No saddle anesthesia, urinary or bowel incontinence.    Referral placed to Dr PoRoland Rackr Dr Dr PaPosey ProntoLumbar and bilateral hip x-ray reflected severe degenerative joint changes of lumbar spine and hip, grade 1 anterolisthesis L4 on L5. Xray of cervical spine reveals mild to moderate arthritic changes.   Compliant with ozempic 0.43m37mFBG 111, 134, 141   Allergies: Metformin and related, Azithromycin, Other, Percocet [oxycodone-acetaminophen], and Jardiance [empagliflozin] Current Outpatient  Medications on File Prior to Visit  Medication Sig Dispense Refill   amiodarone (PACERONE) 200 MG tablet Take 1 tablet (200 mg total) by mouth daily. 90 tablet 3   aspirin EC 81 MG tablet Take 1 tablet (81 mg total) by mouth daily. Swallow whole. (Patient taking differently: Take 81 mg by mouth daily with lunch. Swallow whole.) 90 tablet 3   bisacodyl (DULCOLAX) 5 MG EC tablet Take 5 mg by mouth daily as needed for moderate constipation.     carvedilol (COREG) 3.125 MG tablet Take 1 tablet (3.125 mg total) by mouth 2 (two) times daily with a meal. 60 tablet 5   clopidogrel (PLAVIX) 75 MG tablet TAKE 1 TABLET(75 MG) BY MOUTH DAILY 30 tablet 8   docusate sodium (COLACE) 100 MG capsule Take 100 mg by mouth in the morning, at noon, and at bedtime.     ezetimibe (ZETIA) 10 MG tablet TAKE 1 TABLET(10 MG) BY MOUTH DAILY 90 tablet 2   famotidine (PEPCID) 20 MG tablet Take 20 mg by mouth daily with lunch.     fluticasone (FLONASE) 50 MCG/ACT nasal spray Place into both nostrils daily as needed for allergies or rhinitis.     gabapentin (NEURONTIN) 100 MG capsule Take 1 capsule (100 mg total) by mouth daily as needed. 90 capsule 3   glipiZIDE (GLUCOTROL) 10 MG tablet TAKE 1/2 TABLET BY MOUTH EVERY MORNING WITH BREAKFAST AND 1 TABLET BY MOUTH EVERY EVENING WITH SUPPER 120 tablet 3   lisinopril (ZESTRIL) 10 MG tablet TAKE 1 TABLET BY MOUTH DAILY 90 tablet 1   midodrine (PROAMATINE) 5 MG tablet Take 1 tablet (  5 mg total) by mouth 2 (two) times daily as needed. Take when BP does drop less than 110 when standing. Additional dose at 2 pm if still dropping below 110 when standing 90 tablet 3   Multiple Vitamin (MULTIVITAMIN) tablet Take 1 tablet by mouth daily with lunch.     Omega-3 Fatty Acids (FISH OIL) 1000 MG CAPS Take 1,000 mg by mouth daily with lunch.     Omega-3 Fatty Acids (FISH OIL) 500 MG CAPS Take 500 mg by mouth daily.     ONETOUCH VERIO test strip USE TO CHECK BLOOD SUGAR TWICE DAILY 100 strip 1    pantoprazole (PROTONIX) 40 MG tablet Take 1 tablet (40 mg total) by mouth daily. 90 tablet 3   psyllium (METAMUCIL) 58.6 % powder Take 1 packet by mouth at bedtime.     rosuvastatin (CRESTOR) 10 MG tablet Take 1 tablet (10 mg total) by mouth every other day. 90 tablet 1   Semaglutide, 1 MG/DOSE, 4 MG/3ML SOPN Inject 0.5 mg as directed once a week. 3 mL 3   sodium chloride (OCEAN) 0.65 % SOLN nasal spray Place 1 spray into both nostrils as needed for congestion.     spironolactone (ALDACTONE) 25 MG tablet TAKE 1 TABLET(25 MG) BY MOUTH DAILY 90 tablet 2   tamsulosin (FLOMAX) 0.4 MG CAPS capsule TAKE ONE CAPSULE BY MOUTH DAILY 90 capsule 3   traMADol (ULTRAM) 50 MG tablet TAKE 1 TABLET BY MOUTH EVERY 12 HOURS AS NEEDED 60 tablet 2   No current facility-administered medications on file prior to visit.    Review of Systems  Constitutional:  Negative for chills and fever.  Respiratory:  Negative for cough.   Cardiovascular:  Negative for chest pain and palpitations.  Gastrointestinal:  Negative for nausea and vomiting.      Objective:    BP 124/80   Pulse 65   Temp 97.6 F (36.4 C) (Oral)   Ht 6' (1.829 m)   Wt 209 lb 9.6 oz (95.1 kg)   SpO2 96%   BMI 28.43 kg/m  BP Readings from Last 3 Encounters:  02/27/22 124/80  02/11/22 126/78  02/09/22 137/73   Wt Readings from Last 3 Encounters:  02/27/22 209 lb 9.6 oz (95.1 kg)  02/11/22 210 lb 12.8 oz (95.6 kg)  02/09/22 198 lb (89.8 kg)    Physical Exam Vitals reviewed.  Constitutional:      Appearance: He is well-developed.  Cardiovascular:     Rate and Rhythm: Regular rhythm.     Heart sounds: Normal heart sounds.  Pulmonary:     Effort: Pulmonary effort is normal. No respiratory distress.     Breath sounds: Normal breath sounds. No wheezing, rhonchi or rales.  Skin:    General: Skin is warm and dry.  Neurological:     Mental Status: He is alert.  Psychiatric:        Speech: Speech normal.        Behavior: Behavior  normal.

## 2022-02-27 NOTE — Patient Instructions (Signed)
[email or web address]

## 2022-03-01 NOTE — Assessment & Plan Note (Signed)
Chronic, overall stable.  Awaiting orthopedic consult.  Continue tramadol 16m BID , prn use tylenol arthritis.

## 2022-03-01 NOTE — Assessment & Plan Note (Signed)
Fortunately completely resolved.

## 2022-03-05 ENCOUNTER — Other Ambulatory Visit: Payer: Self-pay | Admitting: Cardiology

## 2022-03-09 ENCOUNTER — Encounter: Payer: Self-pay | Admitting: Family

## 2022-03-13 ENCOUNTER — Encounter: Payer: Self-pay | Admitting: Family

## 2022-03-20 DIAGNOSIS — M4302 Spondylolysis, cervical region: Secondary | ICD-10-CM | POA: Diagnosis not present

## 2022-03-20 DIAGNOSIS — M160 Bilateral primary osteoarthritis of hip: Secondary | ICD-10-CM | POA: Diagnosis not present

## 2022-03-20 DIAGNOSIS — M545 Low back pain, unspecified: Secondary | ICD-10-CM | POA: Diagnosis not present

## 2022-03-20 DIAGNOSIS — M4316 Spondylolisthesis, lumbar region: Secondary | ICD-10-CM | POA: Diagnosis not present

## 2022-04-01 NOTE — Progress Notes (Signed)
Remote ICD transmission.   

## 2022-04-09 ENCOUNTER — Other Ambulatory Visit: Payer: Self-pay | Admitting: Family

## 2022-04-09 DIAGNOSIS — G8929 Other chronic pain: Secondary | ICD-10-CM

## 2022-04-09 NOTE — Telephone Encounter (Signed)
Refilled: 01/11/2022 Last OV: 02/27/2022 Next OV: not scheduled

## 2022-04-24 ENCOUNTER — Encounter: Payer: Self-pay | Admitting: Cardiology

## 2022-04-24 ENCOUNTER — Ambulatory Visit: Payer: Medicare Other | Attending: Cardiology | Admitting: Cardiology

## 2022-04-24 VITALS — BP 100/60 | HR 63 | Ht 73.0 in | Wt 209.0 lb

## 2022-04-24 DIAGNOSIS — I5022 Chronic systolic (congestive) heart failure: Secondary | ICD-10-CM

## 2022-04-24 DIAGNOSIS — Z95810 Presence of automatic (implantable) cardiac defibrillator: Secondary | ICD-10-CM | POA: Diagnosis not present

## 2022-04-24 DIAGNOSIS — I472 Ventricular tachycardia, unspecified: Secondary | ICD-10-CM | POA: Diagnosis not present

## 2022-04-24 DIAGNOSIS — I25118 Atherosclerotic heart disease of native coronary artery with other forms of angina pectoris: Secondary | ICD-10-CM

## 2022-04-24 NOTE — Patient Instructions (Signed)
Medication Instructions:  Your physician recommends that you continue on your current medications as directed. Please refer to the Current Medication list given to you today.  *If you need a refill on your cardiac medications before your next appointment, please call your pharmacy*   Lab Work: No labs ordered  If you have labs (blood work) drawn today and your tests are completely normal, you will receive your results only by: MyChart Message (if you have MyChart) OR A paper copy in the mail If you have any lab test that is abnormal or we need to change your treatment, we will call you to review the results.   Testing/Procedures: No testing ordered  Follow-Up: At [facility], you and your health needs are our priority.  As part of our continuing mission to provide you with exceptional heart care, we have created designated Provider Care Teams.  These Care Teams include your primary Cardiologist (physician) and Advanced Practice Providers (APPs -  Physician Assistants and Nurse Practitioners) who all work together to provide you with the care you need, when you need it.  We recommend signing up for the patient portal called "MyChart".  Sign up information is provided on this After Visit Summary.  MyChart is used to connect with patients for Virtual Visits (Telemedicine).  Patients are able to view lab/test results, encounter notes, upcoming appointments, etc.  Non-urgent messages can be sent to your provider as well.   To learn more about what you can do with MyChart, go to [email or web address].    Your next appointment:   6 month(s)  Provider:   Steffanie Dunn, MD or Sherie Don, NP

## 2022-04-24 NOTE — Progress Notes (Signed)
Cardiology Office Note Date:  04/24/2022  Patient ID:  [name] [name], DOB 07/30/1940, MRN [id number] PCP:  Allegra GranaArnett, Margaret G, [name]  Cardiologist:  Julien Nordmannimothy [name], MD Electrophysiologist: Lanier PrudeAMERON T LAMBERT, MD   Chief Complaint: 6mon device follow-up  History of Present Illness: [name] [name] is a 82 y.o. male with PMH notable for HFrEF, VT, s/p CRT-D, CAD, mural thrombus of LV; seen today for Lanier PrudeAMERON T LAMBERT, MD for routine electrophysiology followup.   Last saw Dr. Lalla BrothersLambert 07/2021. VT quiescent on amiodarone. Having rare phrenic stim with certain positions or if he holds diaphragm a certain way. Had labs from PCP in 01/2022 that had elevated TSH. Due for follow-up thyroid labs now.  Today, he states that he feels relatively well. Continues to have rare phrenic stim with positions, usually lying on R side at night.  Having decreased stamina, thinks it may be d/t covid from 1 month ago. Prior to covid, had better exercise capacity. Able to grocery shop without taking a break. Able to vacuum and do household chores with short breaks. Able to sleep flat, no lower extremity edema, good appetite "perhaps too good".   Checks BP regularly, running 120s.     he denies chest pain, palpitations, dyspnea, PND, orthopnea, nausea, vomiting, dizziness, syncope, edema, weight gain, or early satiety.     Device Information: Bos Sci VVI ICD 02/02/2020 RV lead revision 02/2020 Upgrade to CRT-D, imp 02/2021; dx ICM  AAD History: Amiodarone   Past Medical History:  Diagnosis Date   Aortic insufficiency    a. noted on TTE 2015; b. 06/2019 Echo: AI not visualized.   Arthritis    CAD (coronary artery disease)    a. remote PCI in 1991 and 2005; b. MV 3/15: old inferior MI, no ischemia, LVEF 50%, slight inferior wall hypokniesis; c. 06/2019 NSTEMI/PCI: LM nl, LAD 80p/m (Atherectomy & 4.5x18 Resolute Onyx DES), 4642m/d, D1 75 (PTCA), RI patent stent, LCX nl, RCA 100p, RPAV fills via L->R collats from  LCX.   Chicken pox    Colon polyps    4 pre-cancerous    Diverticulitis    DM type 2 (diabetes mellitus, type 2)    Family history of adverse reaction to anesthesia    GERD (gastroesophageal reflux disease)    Heart murmur    History of kidney stones    HOH (hard of hearing)    Hyperlipidemia    Hypertension    Ischemic cardiomyopathy    a. TTE 2015: EF  50-55%, mild global HK; b. 06/2019 Echo: EF 45-50%, Gr1 DD, basal inf AK. Triv MR.   Kidney stones    Melanoma 1980   Resected from his back   Mitral regurgitation    a. noted on TTE 2015   Myocardial infarction     Past Surgical History:  Procedure Laterality Date   BIV UPGRADE N/A 02/26/2021   Procedure: BIV ICD UPGRADE;  Surgeon: Lanier PrudeLambert, Cameron T, MD;  [facility];  Service: Cardiovascular;  Laterality: N/A;   CHOLECYSTECTOMY  2012   COLONOSCOPY     in 2003 with polyp removed and leak anastomosis had to have open abdominal surgery    CORONARY ANGIOPLASTY WITH STENT PLACEMENT  1991 & 2005   CORONARY ATHERECTOMY N/A 07/08/2019   Procedure: CORONARY ATHERECTOMY;  Surgeon: Corky CraftsVaranasi, Jayadeep S, MD;  [facility];  Service: Cardiovascular;  Laterality: N/A;   CORONARY BALLOON ANGIOPLASTY N/A 07/08/2019   Procedure: CORONARY BALLOON ANGIOPLASTY;  Surgeon: Corky CraftsVaranasi, Jayadeep S,  MD;  Location: [facility] INVASIVE CV LAB;  Service: Cardiovascular;  Laterality: N/A;  diagonal    CORONARY PRESSURE/FFR STUDY N/A 07/07/2019   Procedure: INTRAVASCULAR PRESSURE WIRE/FFR STUDY;  Surgeon: Yvonne Kendall, MD;  Location: [facility] INVASIVE CV LAB;  Service: Cardiovascular;  Laterality: N/A;   CORONARY STENT INTERVENTION N/A 07/08/2019   Procedure: CORONARY STENT INTERVENTION;  Surgeon: Corky Crafts, MD;  Location: [facility] INVASIVE CV LAB;  Service: Cardiovascular;  Laterality: N/A;  lad   CORONARY ULTRASOUND/IVUS N/A 07/08/2019   Procedure: Intravascular Ultrasound/IVUS;  Surgeon: Corky Crafts, MD;  Location: [facility]  INVASIVE CV LAB;  Service: Cardiovascular;  Laterality: N/A;   CYSTOSCOPY/URETEROSCOPY/HOLMIUM LASER/STENT PLACEMENT Right 01/02/2018   Procedure: CYSTOSCOPY/URETEROSCOPY/HOLMIUM LASER/STENT PLACEMENT;  Surgeon: Sondra Come, MD;  Location: [facility] ORS;  Service: Urology;  Laterality: Right;   ESOPHAGOGASTRODUODENOSCOPY (EGD) WITH PROPOFOL N/A 11/19/2016   Procedure: ESOPHAGOGASTRODUODENOSCOPY (EGD) WITH PROPOFOL;  Surgeon: Midge Minium, MD;  Location: [facility] ENDOSCOPY;  Service: Endoscopy;  Laterality: N/A;   ESOPHAGOGASTRODUODENOSCOPY (EGD) WITH PROPOFOL N/A 02/02/2019   Procedure: ESOPHAGOGASTRODUODENOSCOPY (EGD) WITH PROPOFOL;  Surgeon: Midge Minium, MD;  Location: [facility] ENDOSCOPY;  Service: Endoscopy;  Laterality: N/A;   ICD IMPLANT N/A 02/02/2020   Procedure: ICD IMPLANT;  Surgeon: Lanier Prude, MD;  Location: [facility] INVASIVE CV LAB;  Service: Cardiovascular;  Laterality: N/A;   LEAD REVISION/REPAIR N/A 02/16/2020   Procedure: LEAD REVISION/REPAIR;  Surgeon: Regan Lemming, MD;  Location: [facility] INVASIVE CV LAB;  Service: Cardiovascular;  Laterality: N/A;   LEFT HEART CATH N/A 07/08/2019   Procedure: Left Heart Cath;  Surgeon: Corky Crafts, MD;  Location: [facility] INVASIVE CV LAB;  Service: Cardiovascular;  Laterality: N/A;   LEFT HEART CATH AND CORONARY ANGIOGRAPHY N/A 07/07/2019   Procedure: LEFT HEART CATH AND CORONARY ANGIOGRAPHY;  Surgeon: Yvonne Kendall, MD;  Location: [facility] INVASIVE CV LAB;  Service: Cardiovascular;  Laterality: N/A;   LITHOTRIPSY  2015   MELANOMA EXCISION  1980   malignant   NERVE SURGERY  2015   ulna nerve   TONSILLECTOMY  1945   WISDOM TOOTH EXTRACTION      Current Outpatient Medications  Medication Instructions   amiodarone (PACERONE) 200 mg, Oral, Daily   aspirin EC 81 mg, Oral, Daily, Swallow whole.   bisacodyl (DULCOLAX) 5 mg, Oral, Daily PRN   carvedilol (COREG) 3.125 MG tablet TAKE 1 TABLET(3.125 MG) BY MOUTH TWICE DAILY   clopidogrel (PLAVIX) 75  MG tablet TAKE 1 TABLET(75 MG) BY MOUTH DAILY   docusate sodium (COLACE) 100 mg, Oral, 3 times daily   ezetimibe (ZETIA) 10 MG tablet TAKE 1 TABLET(10 MG) BY MOUTH DAILY   famotidine (PEPCID) 20 mg, Oral, Daily with lunch   Fish Oil 1,000 mg, Oral, Daily with lunch   Fish Oil 500 mg, Oral, Every other day   fluticasone (FLONASE) 50 MCG/ACT nasal spray Each Nare, Daily PRN   gabapentin (NEURONTIN) 100 mg, Oral, Daily PRN   glipiZIDE (GLUCOTROL) 10 MG tablet TAKE 1/2 TABLET BY MOUTH EVERY MORNING WITH BREAKFAST AND 1 TABLET BY MOUTH EVERY EVENING WITH SUPPER   lisinopril (ZESTRIL) 10 MG tablet TAKE 1 TABLET BY MOUTH DAILY   midodrine (PROAMATINE) 5 mg, Oral, 2 times daily PRN, Take when BP does drop less than 110 when standing. Additional dose at 2 pm if still dropping below 110 when standing   Multiple Vitamin (MULTIVITAMIN) tablet 1 tablet, Oral, Daily with lunch   ONETOUCH VERIO test strip USE TO CHECK BLOOD SUGAR TWICE DAILY  pantoprazole (PROTONIX) 40 mg, Oral, Daily   psyllium (METAMUCIL) 58.6 % powder 1 packet, Oral, Daily at bedtime   rosuvastatin (CRESTOR) 10 mg, Oral, Every other day   Semaglutide (1 MG/DOSE) 0.5 mg, Injection, Weekly   sodium chloride (OCEAN) 0.65 % SOLN nasal spray 1 spray, Each Nare, As needed   spironolactone (ALDACTONE) 25 MG tablet TAKE 1 TABLET(25 MG) BY MOUTH DAILY   tamsulosin (FLOMAX) 0.4 MG CAPS capsule TAKE ONE CAPSULE BY MOUTH DAILY   traMADol (ULTRAM) 50 MG tablet TAKE 1 TABLET BY MOUTH EVERY 12 HOURS AS NEEDED    Social History:  The patient  reports that he has been smoking pipe. He has been exposed to tobacco smoke. He quit smokeless tobacco use about 6 years ago. He reports current alcohol use. He reports that he does not use drugs.   Family History:  The patient's family history includes Colon cancer in his father; Diabetes in his father and mother; Heart attack in his mother; Heart disease in his mother; Hyperlipidemia in his mother; Hypertension  in his mother; Kidney cancer in his father; Liver cancer in his father; Lung cancer in his father.  ROS:  Please see the history of present illness. All other systems are reviewed and otherwise negative.   PHYSICAL EXAM:  VS:  BP 100/60 (BP Location: Left Arm, Patient Position: Sitting, Cuff Size: Normal)   Pulse 63   Ht 6\' 1"  (1.854 m)   Wt 209 lb (94.8 kg)   SpO2 96%   BMI 27.57 kg/m  BMI: Body mass index is 27.57 kg/m.  GEN- The patient is well appearing, alert and oriented x 3 today.   Lungs- Clear to ausculation bilaterally, normal work of breathing.  Heart- Regular rate and rhythm, no murmurs, rubs or gallops Extremities- No peripheral edema, warm, dry Skin-   device pocket well-healed   Device interrogation done today and reviewed by myself:  Battery good Lead sensing and impedence stable  RA and LV sensing settings both set at 72ms  RA threshold 0.51mV @ 0.14ms -- changed setting to 2 @0 .4ms LV threshold 0.69mV @ 0.9ms -- phrenic stim at 67mV, 2.63mV, and 33mV -- changed setting to 1.77mV @ 0.4 -- no phrenic stim at this setting  No AF episodes BiV pacing 99% HeartLogic at 7  Intermittently dependent   EKG is ordered. Personal review of EKG from today shows:  BiV pacing, rate 63bpm  Recent Labs: 02/09/2022: B Natriuretic Peptide 82.8; Hemoglobin 14.1; Platelets 160 02/12/2022: ALT 38; BUN 17; Creatinine, Ser 0.91; Potassium 4.1; Sodium 138; TSH 5.81  No results found for requested labs within last 365 days.   CrCl cannot be calculated (Patient's most recent lab result is older than the maximum 21 days allowed.).   Wt Readings from Last 3 Encounters:  04/24/22 209 lb (94.8 kg)  02/27/22 209 lb 9.6 oz (95.1 kg)  02/11/22 210 lb 12.8 oz (95.6 kg)     Additional studies reviewed include: Previous EP, cardiology notes.   TTE, 07/12/2021  1. Left ventricular ejection fraction, by estimation, is 55 to 60%. The left ventricle has normal function. The left ventricle has  no regional wall motion abnormalities. Left ventricular diastolic parameters are consistent with Grade I diastolic dysfunction (impaired relaxation).   2. Right ventricular systolic function is normal. The right ventricular size is normal.   3. The mitral valve is normal in structure. No evidence of mitral valve regurgitation.   4. The aortic valve is tricuspid. Aortic valve regurgitation  is not visualized. Mild to moderate aortic valve stenosis. Aortic valve mean gradient measures 18.0 mmHg. Aortic valve Vmax measures 2.50 m/s.   5. The inferior vena cava is normal in size with greater than 50% respiratory variability, suggesting right atrial pressure of 3 mmHg.   Comparison(s): 12/21/19-EF 35-40%.   BiV Upgrade, 03/08/2021 1. Venogram of the LUE demonstrating patent left subclavian/axillary venous system. 2. Successful upgrade of a VVI ICD to a CRT-D with the implant of a CS and RA lead 3. No early apparent complications.   TTE, 12/21/2019  1. Left ventricular ejection fraction, by estimation, is 35 to 40%. The left ventricle has moderately decreased function. The left ventricle demonstrates regional wall motion abnormalities (see scoring diagram/findings for description). The left ventricular internal cavity size was mildly dilated. There is mild asymmetric left ventricular hypertrophy of the septal segment. Left ventricular diastolic parameters are indeterminate. There is akinesis of the left ventricular, basal-mid inferior wall and inferolateral wall.   2. Right ventricular systolic function is normal. The right ventricular size is mildly enlarged.   3. The mitral valve is normal in structure. Trivial mitral valve regurgitation.   4. The aortic valve is tricuspid. There is moderate calcification of the aortic valve. There is moderate thickening of the aortic valve. Aortic valve regurgitation is not visualized. Mild aortic valve stenosis. Aortic valve mean gradient measures 14.0 mmHg.   5. The  inferior vena cava is normal in size with greater than 50% respiratory variability, suggesting right atrial pressure of 3 mmHg.   Conclusion(s)/Recommendation(s): No left ventricular mural or apical thrombus/thrombi.   ASSESSMENT AND PLAN:  #) HFrEF #) CAD Euvolemic on exam Warm and dry Heart Logic low No ischemic s/s Decreased stamina likely d/t deconditioning after covid. Encouraged ongoing physical activity with breaks as needed   #) VT #) CDT-D in situ Device functioning well, see paceart for details Intermittently dependent No further VT episodes Tolerating amiodarone Had labs 01/2022 by PCP - LFTs stable, TSH elevated Due for updated thyroid labs, will follow results    Current medicines are reviewed at length with the patient today.   The patient does not have concerns regarding his medicines.  The following changes were made today:  none  Labs/ tests ordered today include:  Orders Placed This Encounter  Procedures   EKG 12-Lead     Disposition: Follow up with Dr. Lalla Brothers or EP APP in in 6 months   Signed, Sherie Don, NP  04/24/22  2:42 PM  Electrophysiology [facility]

## 2022-04-25 ENCOUNTER — Other Ambulatory Visit (INDEPENDENT_AMBULATORY_CARE_PROVIDER_SITE_OTHER): Payer: Medicare Other

## 2022-04-25 DIAGNOSIS — R899 Unspecified abnormal finding in specimens from other organs, systems and tissues: Secondary | ICD-10-CM | POA: Diagnosis not present

## 2022-04-25 LAB — CUP PACEART INCLINIC DEVICE CHECK
Date Time Interrogation Session: 20240410000000
Date Time Interrogation Session: 20240410000000
HighPow Impedance: 82 Ohm
HighPow Impedance: 82 Ohm
Implantable Lead Connection Status: 753985
Implantable Lead Connection Status: 753985
Implantable Lead Connection Status: 753985
Implantable Lead Connection Status: 753985
Implantable Lead Connection Status: 753985
Implantable Lead Connection Status: 753985
Implantable Lead Implant Date: 20220202
Implantable Lead Implant Date: 20220202
Implantable Lead Implant Date: 20230213
Implantable Lead Implant Date: 20230213
Implantable Lead Implant Date: 20230213
Implantable Lead Implant Date: 20230213
Implantable Lead Location: 753858
Implantable Lead Location: 753858
Implantable Lead Location: 753859
Implantable Lead Location: 753859
Implantable Lead Location: 753860
Implantable Lead Location: 753860
Implantable Lead Model: 273
Implantable Lead Model: 273
Implantable Lead Model: 4671
Implantable Lead Model: 4671
Implantable Lead Model: 7841
Implantable Lead Model: 7841
Implantable Lead Serial Number: 111255
Implantable Lead Serial Number: 111255
Implantable Lead Serial Number: 1233258
Implantable Lead Serial Number: 1233258
Implantable Lead Serial Number: 861310
Implantable Lead Serial Number: 861310
Implantable Pulse Generator Implant Date: 20230213
Implantable Pulse Generator Implant Date: 20230213
Lead Channel Impedance Value: 400 Ohm
Lead Channel Impedance Value: 400 Ohm
Lead Channel Impedance Value: 597 Ohm
Lead Channel Impedance Value: 597 Ohm
Lead Channel Impedance Value: 599 Ohm
Lead Channel Impedance Value: 599 Ohm
Lead Channel Pacing Threshold Amplitude: 0.7 V
Lead Channel Pacing Threshold Amplitude: 0.7 V
Lead Channel Pacing Threshold Amplitude: 0.7 V
Lead Channel Pacing Threshold Amplitude: 0.7 V
Lead Channel Pacing Threshold Amplitude: 1 V
Lead Channel Pacing Threshold Amplitude: 1 V
Lead Channel Pacing Threshold Pulse Width: 0.4 ms
Lead Channel Pacing Threshold Pulse Width: 0.4 ms
Lead Channel Pacing Threshold Pulse Width: 0.4 ms
Lead Channel Pacing Threshold Pulse Width: 0.4 ms
Lead Channel Pacing Threshold Pulse Width: 0.4 ms
Lead Channel Pacing Threshold Pulse Width: 0.4 ms
Lead Channel Sensing Intrinsic Amplitude: 15.5 mV
Lead Channel Sensing Intrinsic Amplitude: 15.5 mV
Lead Channel Sensing Intrinsic Amplitude: 6.5 mV
Lead Channel Sensing Intrinsic Amplitude: 6.5 mV
Lead Channel Sensing Intrinsic Amplitude: 8.8 mV
Lead Channel Sensing Intrinsic Amplitude: 8.8 mV
Lead Channel Setting Pacing Amplitude: 1.8 V
Lead Channel Setting Pacing Amplitude: 1.8 V
Lead Channel Setting Pacing Amplitude: 2 V
Lead Channel Setting Pacing Amplitude: 2 V
Lead Channel Setting Pacing Amplitude: 2 V
Lead Channel Setting Pacing Amplitude: 2 V
Lead Channel Setting Pacing Pulse Width: 0.4 ms
Lead Channel Setting Pacing Pulse Width: 0.4 ms
Lead Channel Setting Pacing Pulse Width: 0.4 ms
Lead Channel Setting Pacing Pulse Width: 0.4 ms
Lead Channel Setting Sensing Sensitivity: 0.6 mV
Lead Channel Setting Sensing Sensitivity: 0.6 mV
Lead Channel Setting Sensing Sensitivity: 1 mV
Lead Channel Setting Sensing Sensitivity: 1 mV
Pulse Gen Serial Number: 279753
Pulse Gen Serial Number: 279753

## 2022-04-26 LAB — TSH: TSH: 3.48 u[IU]/mL (ref 0.35–5.50)

## 2022-04-26 LAB — HEPATIC FUNCTION PANEL
ALT: 47 U/L (ref 0–53)
AST: 63 U/L — ABNORMAL HIGH (ref 0–37)
Albumin: 3.8 g/dL (ref 3.5–5.2)
Alkaline Phosphatase: 90 U/L (ref 39–117)
Bilirubin, Direct: 0.2 mg/dL (ref 0.0–0.3)
Total Bilirubin: 1 mg/dL (ref 0.2–1.2)
Total Protein: 6.9 g/dL (ref 6.0–8.3)

## 2022-04-26 LAB — T4, FREE: Free T4: 0.91 ng/dL (ref 0.60–1.60)

## 2022-04-26 LAB — T3, FREE: T3, Free: 2.8 pg/mL (ref 2.3–4.2)

## 2022-04-29 ENCOUNTER — Telehealth: Payer: Self-pay

## 2022-04-29 NOTE — Telephone Encounter (Signed)
-----   Message from Sherie Don, NP sent at 04/29/2022  8:07 AM EDT ----- Regarding: RE: needs labs Oh thanks! I hadn't seen them  All normal - continue amiodarone    ----- Message ----- From: Thayer Headings, Ruthell Rummage Sent: 04/29/2022   8:07 AM EDT To: Sherie Don, NP Subject: RE: needs labs                                 I wanted to let you know his labs have now resulted if you haven't already seen them. Thank you :)  ----- Message ----- From: Sherie Don, NP Sent: 04/24/2022   4:39 PM EDT To: Rebbeca Sheperd L Newcomer [place]ain Subject: needs labs                                     I know there was some misunderstanding around his labs.  His PCP ordered, and they're due now. Just need patient to go to any lab at any time that's convenient and get them drawn. Did not get drawn today : (  We need to keep an eye out for results

## 2022-04-29 NOTE — Telephone Encounter (Signed)
Spoke with patient to advise him labs are normal per Sherie Don, NP and to continue amiodarone as directed. Patient expressed verbal understanding and did not have any further questions.

## 2022-04-30 ENCOUNTER — Other Ambulatory Visit: Payer: Self-pay | Admitting: Family

## 2022-04-30 DIAGNOSIS — M545 Low back pain, unspecified: Secondary | ICD-10-CM

## 2022-05-07 ENCOUNTER — Other Ambulatory Visit: Payer: Self-pay | Admitting: Family

## 2022-05-07 DIAGNOSIS — G8929 Other chronic pain: Secondary | ICD-10-CM

## 2022-05-07 DIAGNOSIS — I10 Essential (primary) hypertension: Secondary | ICD-10-CM

## 2022-05-07 NOTE — Telephone Encounter (Signed)
LOV: 02/27/22  NOV: Not scheduled

## 2022-05-08 ENCOUNTER — Telehealth: Payer: Self-pay

## 2022-05-08 NOTE — Telephone Encounter (Signed)
Patient came in and picked up his medication Ozempic (4 boxes) from patient assistance @ 3:40

## 2022-05-08 NOTE — Telephone Encounter (Signed)
Spoke to pt and informed him that his Patient Assistance medication Ozempic 1 mg (4 boxes) came in today and he can come in anytime between 1-5 pm M-F to pick it up

## 2022-05-09 DIAGNOSIS — H40003 Preglaucoma, unspecified, bilateral: Secondary | ICD-10-CM | POA: Diagnosis not present

## 2022-05-09 DIAGNOSIS — H2513 Age-related nuclear cataract, bilateral: Secondary | ICD-10-CM | POA: Diagnosis not present

## 2022-05-13 DIAGNOSIS — H6123 Impacted cerumen, bilateral: Secondary | ICD-10-CM | POA: Diagnosis not present

## 2022-05-13 DIAGNOSIS — H903 Sensorineural hearing loss, bilateral: Secondary | ICD-10-CM | POA: Diagnosis not present

## 2022-05-28 ENCOUNTER — Ambulatory Visit (INDEPENDENT_AMBULATORY_CARE_PROVIDER_SITE_OTHER): Payer: Medicare Other

## 2022-05-28 DIAGNOSIS — I5022 Chronic systolic (congestive) heart failure: Secondary | ICD-10-CM | POA: Diagnosis not present

## 2022-05-28 LAB — CUP PACEART REMOTE DEVICE CHECK
Battery Remaining Longevity: 132 mo
Battery Remaining Percentage: 100 %
Brady Statistic RA Percent Paced: 9 %
Brady Statistic RV Percent Paced: 100 %
Date Time Interrogation Session: 20240514002100
HighPow Impedance: 77 Ohm
Implantable Lead Connection Status: 753985
Implantable Lead Connection Status: 753985
Implantable Lead Connection Status: 753985
Implantable Lead Implant Date: 20220202
Implantable Lead Implant Date: 20230213
Implantable Lead Implant Date: 20230213
Implantable Lead Location: 753858
Implantable Lead Location: 753859
Implantable Lead Location: 753860
Implantable Lead Model: 273
Implantable Lead Model: 4671
Implantable Lead Model: 7841
Implantable Lead Serial Number: 111255
Implantable Lead Serial Number: 1233258
Implantable Lead Serial Number: 861310
Implantable Pulse Generator Implant Date: 20230213
Lead Channel Impedance Value: 387 Ohm
Lead Channel Impedance Value: 533 Ohm
Lead Channel Impedance Value: 576 Ohm
Lead Channel Pacing Threshold Amplitude: 1 V
Lead Channel Pacing Threshold Pulse Width: 0.4 ms
Lead Channel Setting Pacing Amplitude: 1.8 V
Lead Channel Setting Pacing Amplitude: 2 V
Lead Channel Setting Pacing Amplitude: 2 V
Lead Channel Setting Pacing Pulse Width: 0.4 ms
Lead Channel Setting Pacing Pulse Width: 0.4 ms
Lead Channel Setting Sensing Sensitivity: 0.6 mV
Lead Channel Setting Sensing Sensitivity: 1 mV
Pulse Gen Serial Number: 279753

## 2022-05-28 NOTE — Progress Notes (Unsigned)
Referring Physician:  Allegra Grana, [name] [address] [place],  [place] [address]  Primary Physician:  Allegra Grana, [name]  History of Present Illness: 05/30/2022 [name] [name] has a history of CAD, DM, GERD, heart murmur, MI, hyperlipidemia, HTN, and melanoma.   He has history of chronic back pain x years that has been worse in last year.   He has constant LBP with right lateral thigh pain that is worse with activity. Some improvement with rest. LBP is primary complaint. He was walking around the block and now can't do this because of back pain- some relief when he bends forward. He has diabetic neuropathy in both feet. No weakness in legs.   He is on PLAVIX. Has defibrillator and pacemaker    Bowel/Bladder Dysfunction: none  Smokes occasional pipe.   Conservative measures:  Physical therapy:  went to PT about 2 years ago - doesn't remember where Multimodal medical therapy including regular antiinflammatories: neurontin, ultram  Injections: one lumbar injection about 10 years ago - "very helpful"  Past Surgery: no prior spine surgery  [name] [name] has no symptoms of cervical myelopathy.  The symptoms are causing a significant impact on the patient's life.   Review of Systems:  A 10 point review of systems is negative, except for the pertinent positives and negatives detailed in the HPI.  Past Medical History: Past Medical History:  Diagnosis Date   Aortic insufficiency    a. noted on TTE 2015; b. 06/2019 Echo: AI not visualized.   Arthritis    CAD (coronary artery disease)    a. remote PCI in 1991 and 2005; b. MV 3/15: old inferior MI, no ischemia, LVEF 50%, slight inferior wall hypokniesis; c. 06/2019 NSTEMI/PCI: LM nl, LAD 80p/m (Atherectomy & 4.5x18 Resolute Onyx DES), 28m/d, D1 75 (PTCA), RI patent stent, LCX nl, RCA 100p, RPAV fills via L->R collats from LCX.   Chicken pox    Colon polyps    4 pre-cancerous    Diverticulitis    DM  type 2 (diabetes mellitus, type 2) (HCC)    Family history of adverse reaction to anesthesia    GERD (gastroesophageal reflux disease)    Heart murmur    History of kidney stones    HOH (hard of hearing)    Hyperlipidemia    Hypertension    Ischemic cardiomyopathy    a. TTE 2015: EF  50-55%, mild global HK; b. 06/2019 Echo: EF 45-50%, Gr1 DD, basal inf AK. Triv MR.   Kidney stones    Melanoma (HCC) 1980   Resected from his back   Mitral regurgitation    a. noted on TTE 2015   Myocardial infarction [facility])     Past Surgical History: Past Surgical History:  Procedure Laterality Date   BIV UPGRADE N/A 02/26/2021   Procedure: BIV ICD UPGRADE;  Surgeon: Lanier Prude, MD;  Location: [facility] INVASIVE CV LAB;  Service: Cardiovascular;  Laterality: N/A;   CHOLECYSTECTOMY  2012   COLONOSCOPY     in 2003 with polyp removed and leak anastomosis had to have open abdominal surgery    CORONARY ANGIOPLASTY WITH STENT PLACEMENT  1991 & 2005   CORONARY ATHERECTOMY N/A 07/08/2019   Procedure: CORONARY ATHERECTOMY;  Surgeon: Corky Crafts, MD;  Location: [facility] INVASIVE CV LAB;  Service: Cardiovascular;  Laterality: N/A;   CORONARY BALLOON ANGIOPLASTY N/A 07/08/2019   Procedure: CORONARY BALLOON ANGIOPLASTY;  Surgeon: Corky Crafts, MD;  Location: [facility] INVASIVE CV LAB;  Service: Cardiovascular;  Laterality: N/A;  diagonal    CORONARY PRESSURE/FFR STUDY N/A 07/07/2019   Procedure: INTRAVASCULAR PRESSURE WIRE/FFR STUDY;  Surgeon: Yvonne Kendall, MD;  Location: [facility] INVASIVE CV LAB;  Service: Cardiovascular;  Laterality: N/A;   CORONARY STENT INTERVENTION N/A 07/08/2019   Procedure: CORONARY STENT INTERVENTION;  Surgeon: Corky Crafts, MD;  Location: [facility] INVASIVE CV LAB;  Service: Cardiovascular;  Laterality: N/A;  lad   CORONARY ULTRASOUND/IVUS N/A 07/08/2019   Procedure: Intravascular Ultrasound/IVUS;  Surgeon: Corky Crafts, MD;  Location: [facility] INVASIVE CV LAB;  Service: Cardiovascular;   Laterality: N/A;   CYSTOSCOPY/URETEROSCOPY/HOLMIUM LASER/STENT PLACEMENT Right 01/02/2018   Procedure: CYSTOSCOPY/URETEROSCOPY/HOLMIUM LASER/STENT PLACEMENT;  Surgeon: Sondra Come, MD;  Location: [facility] ORS;  Service: Urology;  Laterality: Right;   ESOPHAGOGASTRODUODENOSCOPY (EGD) WITH PROPOFOL N/A 11/19/2016   Procedure: ESOPHAGOGASTRODUODENOSCOPY (EGD) WITH PROPOFOL;  Surgeon: Midge Minium, MD;  Location: [facility] ENDOSCOPY;  Service: Endoscopy;  Laterality: N/A;   ESOPHAGOGASTRODUODENOSCOPY (EGD) WITH PROPOFOL N/A 02/02/2019   Procedure: ESOPHAGOGASTRODUODENOSCOPY (EGD) WITH PROPOFOL;  Surgeon: Midge Minium, MD;  Location: [facility] ENDOSCOPY;  Service: Endoscopy;  Laterality: N/A;   ICD IMPLANT N/A 02/02/2020   Procedure: ICD IMPLANT;  Surgeon: Lanier Prude, MD;  Location: [facility] INVASIVE CV LAB;  Service: Cardiovascular;  Laterality: N/A;   LEAD REVISION/REPAIR N/A 02/16/2020   Procedure: LEAD REVISION/REPAIR;  Surgeon: Regan Lemming, MD;  Location: [facility] INVASIVE CV LAB;  Service: Cardiovascular;  Laterality: N/A;   LEFT HEART CATH N/A 07/08/2019   Procedure: Left Heart Cath;  Surgeon: Corky Crafts, MD;  Location: [facility] INVASIVE CV LAB;  Service: Cardiovascular;  Laterality: N/A;   LEFT HEART CATH AND CORONARY ANGIOGRAPHY N/A 07/07/2019   Procedure: LEFT HEART CATH AND CORONARY ANGIOGRAPHY;  Surgeon: Yvonne Kendall, MD;  Location: [facility] INVASIVE CV LAB;  Service: Cardiovascular;  Laterality: N/A;   LITHOTRIPSY  2015   MELANOMA EXCISION  1980   malignant   NERVE SURGERY  2015   ulna nerve   TONSILLECTOMY  1945   WISDOM TOOTH EXTRACTION      Allergies: Allergies as of 05/30/2022 - Review Complete 05/30/2022  Allergen Reaction Noted   Metformin and related Diarrhea and Other (See Comments) 07/13/2018   Azithromycin Other (See Comments) 07/07/2015   Other Other (See Comments) 07/07/2019   Percocet [oxycodone-acetaminophen] Other (See Comments) 07/07/2015   Jardiance [empagliflozin]  Other (See Comments) 08/03/2019    Medications: Outpatient Encounter Medications as of 05/30/2022  Medication Sig   amiodarone (PACERONE) 200 MG tablet Take 1 tablet (200 mg total) by mouth daily.   aspirin EC 81 MG tablet Take 1 tablet (81 mg total) by mouth daily. Swallow whole.   bisacodyl (DULCOLAX) 5 MG EC tablet Take 5 mg by mouth daily as needed for moderate constipation.   carvedilol (COREG) 3.125 MG tablet TAKE 1 TABLET(3.125 MG) BY MOUTH TWICE DAILY   clopidogrel (PLAVIX) 75 MG tablet TAKE 1 TABLET(75 MG) BY MOUTH DAILY   docusate sodium (COLACE) 100 MG capsule Take 100 mg by mouth in the morning, at noon, and at bedtime.   ezetimibe (ZETIA) 10 MG tablet TAKE 1 TABLET(10 MG) BY MOUTH DAILY   famotidine (PEPCID) 20 MG tablet Take 20 mg by mouth daily with lunch.   fluticasone (FLONASE) 50 MCG/ACT nasal spray Place into both nostrils daily as needed for allergies or rhinitis.   gabapentin (NEURONTIN) 100 MG capsule TAKE 1 CAPSULE(100 MG) BY MOUTH THREE TIMES DAILY   glipiZIDE (GLUCOTROL) 10 MG tablet TAKE 1/2 TABLET  BY MOUTH EVERY MORNING WITH BREAKFAST AND 1 TABLET BY MOUTH EVERY EVENING WITH SUPPER   lisinopril (ZESTRIL) 10 MG tablet TAKE 1 TABLET BY MOUTH DAILY   midodrine (PROAMATINE) 5 MG tablet Take 1 tablet (5 mg total) by mouth 2 (two) times daily as needed. Take when BP does drop less than 110 when standing. Additional dose at 2 pm if still dropping below 110 when standing   Multiple Vitamin (MULTIVITAMIN) tablet Take 1 tablet by mouth daily with lunch.   Omega-3 Fatty Acids (FISH OIL) 1000 MG CAPS Take 1,000 mg by mouth daily with lunch.   Omega-3 Fatty Acids (FISH OIL) 500 MG CAPS Take 500 mg by mouth every other day.   ONETOUCH VERIO test strip USE TO CHECK BLOOD SUGAR TWICE DAILY AS DIRECTED   pantoprazole (PROTONIX) 40 MG tablet Take 1 tablet (40 mg total) by mouth daily.   psyllium (METAMUCIL) 58.6 % powder Take 1 packet by mouth at bedtime.   rosuvastatin (CRESTOR) 10  MG tablet Take 1 tablet (10 mg total) by mouth every other day.   Semaglutide, 1 MG/DOSE, 4 MG/3ML SOPN Inject 1 mg as directed once a week.   sodium chloride (OCEAN) 0.65 % SOLN nasal spray Place 1 spray into both nostrils as needed for congestion.   spironolactone (ALDACTONE) 25 MG tablet TAKE 1 TABLET(25 MG) BY MOUTH DAILY   tamsulosin (FLOMAX) 0.4 MG CAPS capsule TAKE ONE CAPSULE BY MOUTH DAILY   traMADol (ULTRAM) 50 MG tablet TAKE 1 TABLET BY MOUTH EVERY 12 HOURS AS NEEDED   [DISCONTINUED] Semaglutide, 1 MG/DOSE, 4 MG/3ML SOPN Inject 0.5 mg as directed once a week.   No facility-administered encounter medications on file as of 05/30/2022.    Social History: Social History   Tobacco Use   Smoking status: Some Days    Types: Pipe    Passive exposure: Current   Smokeless tobacco: Former    Quit date: 10/17/2015  Vaping Use   Vaping Use: Never used  Substance Use Topics   Alcohol use: Yes    Alcohol/week: 0.0 - 1.0 standard drinks of alcohol    Comment: OCCASIONALLY   Drug use: No    Family Medical History: Family History  Problem Relation Age of Onset   Hyperlipidemia Mother    Hypertension Mother    Heart disease Mother    Diabetes Mother    Heart attack Mother    Colon cancer Father        metasized to liver, adrenal, lungs   Lung cancer Father    Kidney cancer Father        malignant capsulated kidney tumor   Diabetes Father    Liver cancer Father    Bladder Cancer Neg Hx    Prostate cancer Neg Hx     Physical Examination: Vitals:   05/30/22 1109  BP: 110/68    General: Patient is well developed, well nourished, calm, collected, and in no apparent distress. Attention to examination is appropriate.  Respiratory: Patient is breathing without any difficulty.   NEUROLOGICAL:     Awake, alert, oriented to person, place, and time.  Speech is clear and fluent. Fund of knowledge is appropriate.   Cranial Nerves: Pupils equal round and reactive to light.   Facial tone is symmetric.    Mild central posterior lumbar tenderness.   No abnormal lesions on exposed skin.   Strength: Side Biceps Triceps Deltoid Interossei Grip Wrist Ext. Wrist Flex.  R 5 5 5 5 5  5  5  L 5 5 5 5 5 5 5    Side Iliopsoas Quads Hamstring PF DF EHL  R 5 5 5 5 5 5   L 5 5 5 5 5 5    Reflexes are 1+ and symmetric at the biceps, triceps, brachioradialis, patella and achilles.   Hoffman's is absent.  Clonus is not present.   Bilateral upper and lower extremity sensation is intact to light touch.     Gait is slow.   Medical Decision Making  Imaging: Lumbar xrays dated 02/12/22:  FINDINGS: There is no evidence of lumbar spine fracture. Curvature of spine. Grade 1 anterolisthesis of L4 on L5 is identified. Facet joint sclerosis is identified in the lower lumbar spine. Bilateral kidney stones are noted. Calcified mass of the left kidney is identified unchanged compared to prior CT of 2023.   IMPRESSION: Degenerative joint changes of lumbar spine. No acute fracture or dislocation noted.     Electronically Signed   By: Sherian Rein M.D.   On: 02/12/2022 08:53  I have personally reviewed the images and agree with the above interpretation.  Assessment and Plan: [name] [name] is a pleasant 82 y.o. male that has constant LBP with right lateral thigh pain that is worse with activity/walking. LBP is primary complaint. He was walking around the block and now can't do this because of back pain- some relief when he bends forward.   He has known slip at L4-L5 with lumbar spondylosis.   Treatment options discussed with patient and following plan made:   - CT of lumbar spine to further evaluate lumbar radiculopathy. Has pacemaker and defibrillator and cannot have MRI.  - Discussed trial of PT. He wants to hold until we get CT.  - Depending on results of CT, may refer for lumbar injections.  - Medications limited as he is on PLAVIX.   I spent a total of 25 minutes in  face-to-face and non-face-to-face activities related to this patient's care today including review of outside records, review of imaging, review of symptoms, physical exam, discussion of differential diagnosis, discussion of treatment options, and documentation.   Thank you for involving me in the care of this patient.   Drake Leach PA-C Dept. of Neurosurgery

## 2022-05-29 ENCOUNTER — Encounter: Payer: Self-pay | Admitting: Family

## 2022-05-29 ENCOUNTER — Ambulatory Visit (INDEPENDENT_AMBULATORY_CARE_PROVIDER_SITE_OTHER): Payer: Medicare Other | Admitting: Family

## 2022-05-29 VITALS — BP 126/72 | HR 85 | Temp 97.8°F | Ht 73.0 in | Wt 210.8 lb

## 2022-05-29 DIAGNOSIS — E782 Mixed hyperlipidemia: Secondary | ICD-10-CM | POA: Diagnosis not present

## 2022-05-29 DIAGNOSIS — Z7985 Long-term (current) use of injectable non-insulin antidiabetic drugs: Secondary | ICD-10-CM | POA: Diagnosis not present

## 2022-05-29 DIAGNOSIS — E118 Type 2 diabetes mellitus with unspecified complications: Secondary | ICD-10-CM | POA: Diagnosis not present

## 2022-05-29 DIAGNOSIS — R7309 Other abnormal glucose: Secondary | ICD-10-CM

## 2022-05-29 LAB — POCT GLYCOSYLATED HEMOGLOBIN (HGB A1C): Hemoglobin A1C: 6.8 % — AB (ref 4.0–5.6)

## 2022-05-29 MED ORDER — SEMAGLUTIDE (1 MG/DOSE) 4 MG/3ML ~~LOC~~ SOPN
1.0000 mg | PEN_INJECTOR | SUBCUTANEOUS | 3 refills | Status: AC
Start: 2022-05-29 — End: ?

## 2022-05-29 NOTE — Assessment & Plan Note (Signed)
Lab Results  Component Value Date   HGBA1C 6.8 (A) 05/29/2022   Overall controlled.  Patient interested in resuming previous dose of Ozempic 1 mg as he has gained weight of late.  Constipation has resolved on Metamucil.  Resume Ozempic 1 mg.  Continue glipizide 5 mg every morning and 10mg  with dinner.

## 2022-05-29 NOTE — Progress Notes (Signed)
Assessment & Plan:  Controlled type 2 diabetes mellitus with complication, without long-term current use of insulin (HCC) Assessment & Plan: Lab Results  Component Value Date   HGBA1C 6.8 (A) 05/29/2022   Overall controlled.  Patient interested in resuming previous dose of Ozempic 1 mg as he has gained weight of late.  Constipation has resolved on Metamucil.  Resume Ozempic 1 mg.  Continue glipizide 5 mg every morning and 10mg  with dinner.   Orders: -     Semaglutide (1 MG/DOSE); Inject 1 mg as directed once a week.  Dispense: 3 mL; Refill: 3 -     Lipid panel; Future  Elevated glucose -     POCT glycosylated hemoglobin (Hb A1C) -     Microalbumin / creatinine urine ratio; Future  Mixed hyperlipidemia Assessment & Plan: Pending lipid panel.  Continue Crestor 10 mg every other day, omega-3      Return precautions given.   Risks, benefits, and alternatives of the medications and treatment plan prescribed today were discussed, and patient expressed understanding.   Education regarding symptom management and diagnosis given to patient on AVS either electronically or printed.  Return in about 3 months (around 08/29/2022) for Medicare Wellness upcoming or due, schedule, Fasting labs in 2-3 weeks.  Rennie Plowman, [name]  Subjective:    Patient ID: [name] [name], male    DOB: September 27, 1940, 82 y.o.   MRN: [id number]  CC: [name] [name] is a 82 y.o. male who presents today for follow up.   HPI: Overall feels well today.  No new concerns   He an appointment tomorrow with neurosurgery for second opinion in regards to low back pain.  FBG 159 , 132, 130.  He has noticed since decreasing glipiide and ozempic. He is walking less with low back pain.   Constipation has resolved with metamucil.  He would like to increase ozempic back to 1mg .   No leg swelling,SOB, CP.    He is compliant with Crestor 10 mg every other day.  He remains on omega-3   Follow-up cardiology  04/24/2022 heart failure reduced ejection fraction, CAD  He continues to follow Dr. Lalla Brothers for history of ischemic cardiomyopathy   Allergies: Metformin and related, Azithromycin, Other, Percocet [oxycodone-acetaminophen], and Jardiance [empagliflozin] Current Outpatient Medications on File Prior to Visit  Medication Sig Dispense Refill   amiodarone (PACERONE) 200 MG tablet Take 1 tablet (200 mg total) by mouth daily. 90 tablet 3   aspirin EC 81 MG tablet Take 1 tablet (81 mg total) by mouth daily. Swallow whole. 90 tablet 3   bisacodyl (DULCOLAX) 5 MG EC tablet Take 5 mg by mouth daily as needed for moderate constipation.     carvedilol (COREG) 3.125 MG tablet TAKE 1 TABLET(3.125 MG) BY MOUTH TWICE DAILY 180 tablet 1   clopidogrel (PLAVIX) 75 MG tablet TAKE 1 TABLET(75 MG) BY MOUTH DAILY 30 tablet 8   docusate sodium (COLACE) 100 MG capsule Take 100 mg by mouth in the morning, at noon, and at bedtime.     ezetimibe (ZETIA) 10 MG tablet TAKE 1 TABLET(10 MG) BY MOUTH DAILY 90 tablet 2   famotidine (PEPCID) 20 MG tablet Take 20 mg by mouth daily with lunch.     fluticasone (FLONASE) 50 MCG/ACT nasal spray Place into both nostrils daily as needed for allergies or rhinitis.     gabapentin (NEURONTIN) 100 MG capsule TAKE 1 CAPSULE(100 MG) BY MOUTH THREE TIMES DAILY 90 capsule 3   glipiZIDE (GLUCOTROL) 10 MG tablet  TAKE 1/2 TABLET BY MOUTH EVERY MORNING WITH BREAKFAST AND 1 TABLET BY MOUTH EVERY EVENING WITH SUPPER 120 tablet 3   lisinopril (ZESTRIL) 10 MG tablet TAKE 1 TABLET BY MOUTH DAILY 90 tablet 1   midodrine (PROAMATINE) 5 MG tablet Take 1 tablet (5 mg total) by mouth 2 (two) times daily as needed. Take when BP does drop less than 110 when standing. Additional dose at 2 pm if still dropping below 110 when standing 90 tablet 3   Multiple Vitamin (MULTIVITAMIN) tablet Take 1 tablet by mouth daily with lunch.     Omega-3 Fatty Acids (FISH OIL) 1000 MG CAPS Take 1,000 mg by mouth daily with lunch.      Omega-3 Fatty Acids (FISH OIL) 500 MG CAPS Take 500 mg by mouth every other day.     ONETOUCH VERIO test strip USE TO CHECK BLOOD SUGAR TWICE DAILY AS DIRECTED 100 strip 1   pantoprazole (PROTONIX) 40 MG tablet Take 1 tablet (40 mg total) by mouth daily. 90 tablet 3   psyllium (METAMUCIL) 58.6 % powder Take 1 packet by mouth at bedtime.     rosuvastatin (CRESTOR) 10 MG tablet Take 1 tablet (10 mg total) by mouth every other day. 90 tablet 1   sodium chloride (OCEAN) 0.65 % SOLN nasal spray Place 1 spray into both nostrils as needed for congestion.     spironolactone (ALDACTONE) 25 MG tablet TAKE 1 TABLET(25 MG) BY MOUTH DAILY 90 tablet 2   tamsulosin (FLOMAX) 0.4 MG CAPS capsule TAKE ONE CAPSULE BY MOUTH DAILY 90 capsule 3   traMADol (ULTRAM) 50 MG tablet TAKE 1 TABLET BY MOUTH EVERY 12 HOURS AS NEEDED 60 tablet 2   No current facility-administered medications on file prior to visit.    Review of Systems  Constitutional:  Negative for chills and fever.  Respiratory:  Negative for cough.   Cardiovascular:  Negative for chest pain and palpitations.  Gastrointestinal:  Negative for constipation (resolved), nausea and vomiting.  Musculoskeletal:  Positive for back pain (chronic).      Objective:    BP 126/72   Pulse 85   Temp 97.8 F (36.6 C) (Oral)   Ht 6\' 1"  (1.854 m)   Wt 210 lb 12.8 oz (95.6 kg)   SpO2 98%   BMI 27.81 kg/m  BP Readings from Last 3 Encounters:  05/29/22 126/72  04/24/22 100/60  02/27/22 124/80   Wt Readings from Last 3 Encounters:  05/29/22 210 lb 12.8 oz (95.6 kg)  04/24/22 209 lb (94.8 kg)  02/27/22 209 lb 9.6 oz (95.1 kg)    Physical Exam Vitals reviewed.  Constitutional:      Appearance: He is well-developed.  Cardiovascular:     Rate and Rhythm: Regular rhythm.     Heart sounds: Normal heart sounds.  Pulmonary:     Effort: Pulmonary effort is normal. No respiratory distress.     Breath sounds: Normal breath sounds. No wheezing, rhonchi or  rales.  Skin:    General: Skin is warm and dry.  Neurological:     Mental Status: He is alert.  Psychiatric:        Speech: Speech normal.        Behavior: Behavior normal.

## 2022-05-29 NOTE — Assessment & Plan Note (Signed)
Pending lipid panel.  Continue Crestor 10 mg every other day, omega-3

## 2022-05-29 NOTE — Patient Instructions (Addendum)
Increase ozempic to 1mg   Nice to see you!

## 2022-05-30 ENCOUNTER — Encounter: Payer: Self-pay | Admitting: Orthopedic Surgery

## 2022-05-30 ENCOUNTER — Ambulatory Visit: Payer: Medicare Other | Admitting: Orthopedic Surgery

## 2022-05-30 VITALS — BP 110/68 | Ht 73.0 in | Wt 212.0 lb

## 2022-05-30 DIAGNOSIS — M4726 Other spondylosis with radiculopathy, lumbar region: Secondary | ICD-10-CM

## 2022-05-30 DIAGNOSIS — M4316 Spondylolisthesis, lumbar region: Secondary | ICD-10-CM

## 2022-05-30 DIAGNOSIS — M47816 Spondylosis without myelopathy or radiculopathy, lumbar region: Secondary | ICD-10-CM

## 2022-05-30 DIAGNOSIS — M5416 Radiculopathy, lumbar region: Secondary | ICD-10-CM

## 2022-05-30 NOTE — Patient Instructions (Signed)
It was so nice to see you today. Thank you so much for coming in.    Your lumbar xrays showed wear and tear in your back and I think this may be causing your pain.   I want to get a CT of your lower back to look into things further. We will get this approved through your insurance and Outpatient imaging will call you to schedule the appointment.   Once I have the CT results, we will call to schedule a follow up with me to review them. Please do not hesitate to call if you have any questions or concerns. You can also message me in MyChart.    Drake Leach PA-C [phone]

## 2022-06-03 ENCOUNTER — Ambulatory Visit (INDEPENDENT_AMBULATORY_CARE_PROVIDER_SITE_OTHER): Payer: Medicare Other | Admitting: Podiatry

## 2022-06-03 DIAGNOSIS — E1142 Type 2 diabetes mellitus with diabetic polyneuropathy: Secondary | ICD-10-CM

## 2022-06-03 DIAGNOSIS — M79675 Pain in left toe(s): Secondary | ICD-10-CM | POA: Diagnosis not present

## 2022-06-03 DIAGNOSIS — M79674 Pain in right toe(s): Secondary | ICD-10-CM

## 2022-06-03 DIAGNOSIS — B351 Tinea unguium: Secondary | ICD-10-CM

## 2022-06-03 NOTE — Progress Notes (Unsigned)
  Subjective:  Patient ID: [name] [name], male    DOB: 1940-04-11,  MRN: [id number]  Braxton Colandrea presents to clinic today for: No chief complaint on file. [name]  PCP is [name], Lyn Records, [name].  Allergies  Allergen Reactions   Metformin And Related Diarrhea and Other (See Comments)    Leg cramps, also   Azithromycin Other (See Comments)    Not recommended - no reaction   Other Other (See Comments)    If taking antibiotics for awhile, thrush results   Percocet [Oxycodone-Acetaminophen] Other (See Comments)    Hallucinations   Jardiance [Empagliflozin] Other (See Comments)    Tongue itching/reaction    Review of Systems: Negative except as noted in the HPI.  Objective: No changes noted in today's physical examination. There were no vitals filed for this visit.  [name] is a pleasant 82 y.o. male in NAD. AAO x 3.  Vascular Examination: Capillary refill time <3 seconds b/l LE. Palpable pedal pulses b/l LE. Digital hair present b/l. No pedal edema b/l. Skin temperature gradient WNL b/l. No varicosities b/l. {jgvascular:23595}.  Dermatological Examination: Pedal skin with normal turgor, texture and tone b/l. No open wounds. No interdigital macerations b/l. Toenails 1-5 b/l thickened, discolored, dystrophic with subungual debris. There is pain on palpation to dorsal aspect of nailplates. {jgderm:23598}.  Neurological Examination: Protective sensation intact with 10 gram monofilament b/l LE. Vibratory sensation intact b/l LE. {jgneuro:23601::"Protective sensation intact 5/5 intact bilaterally with 10g monofilament b/l.","Vibratory sensation intact b/l.","Proprioception intact bilaterally."}  Musculoskeletal Examination: Muscle strength 5/5 to all LE muscle groups b/l. {jgmsk:23600}  Xray findings {jgPodToeLocator:23637}: {jgxrayfindings:23683}      Latest Ref Rng & Units 05/29/2022   10:08 AM 11/28/2021   11:29 AM 08/27/2021    9:40 AM  Hemoglobin A1C   Hemoglobin-A1c 4.0 - 5.6 % 6.8  6.5  6.5     Assessment/Plan: No diagnosis found.  No orders of the defined types were placed in this encounter.  None {Jgplan:23602::"-Patient/POA to call should there be question/concern in the interim."}   Return in about 3 months (around 09/03/2022).  Freddie Breech, DPM

## 2022-06-05 ENCOUNTER — Encounter: Payer: Self-pay | Admitting: Podiatry

## 2022-06-12 ENCOUNTER — Ambulatory Visit
Admission: RE | Admit: 2022-06-12 | Discharge: 2022-06-12 | Disposition: A | Discharge status: Home / Self Care | Payer: Medicare Other | Source: Ambulatory Visit | Attending: Orthopedic Surgery | Admitting: Orthopedic Surgery

## 2022-06-12 DIAGNOSIS — M545 Low back pain, unspecified: Secondary | ICD-10-CM | POA: Diagnosis not present

## 2022-06-12 DIAGNOSIS — M5416 Radiculopathy, lumbar region: Secondary | ICD-10-CM | POA: Insufficient documentation

## 2022-06-12 DIAGNOSIS — M47816 Spondylosis without myelopathy or radiculopathy, lumbar region: Secondary | ICD-10-CM | POA: Insufficient documentation

## 2022-06-12 DIAGNOSIS — M4316 Spondylolisthesis, lumbar region: Secondary | ICD-10-CM | POA: Diagnosis not present

## 2022-06-19 ENCOUNTER — Other Ambulatory Visit (INDEPENDENT_AMBULATORY_CARE_PROVIDER_SITE_OTHER): Payer: Medicare Other

## 2022-06-19 DIAGNOSIS — R7309 Other abnormal glucose: Secondary | ICD-10-CM

## 2022-06-19 DIAGNOSIS — E118 Type 2 diabetes mellitus with unspecified complications: Secondary | ICD-10-CM | POA: Diagnosis not present

## 2022-06-19 LAB — LDL CHOLESTEROL, DIRECT: Direct LDL: 64 mg/dL

## 2022-06-19 LAB — LIPID PANEL
Cholesterol: 118 mg/dL (ref 0–200)
HDL: 39.9 mg/dL (ref >39.00)
NonHDL: 77.64
Total CHOL/HDL Ratio: 3
Triglycerides: 212 mg/dL — ABNORMAL HIGH (ref 0.0–149.0)
VLDL: 42.4 mg/dL — ABNORMAL HIGH (ref 0.0–40.0)

## 2022-06-20 LAB — MICROALBUMIN / CREATININE URINE RATIO
Creatinine,U: 76.6 mg/dL
Microalb Creat Ratio: 2 mg/g (ref 0.0–30.0)
Microalb, Ur: 1.5 mg/dL (ref 0.0–1.9)

## 2022-06-24 NOTE — Progress Notes (Signed)
Remote ICD transmission.   

## 2022-06-26 ENCOUNTER — Ambulatory Visit: Payer: Medicare Other | Admitting: Dermatology

## 2022-06-26 VITALS — BP 115/69

## 2022-06-26 DIAGNOSIS — Z79899 Other long term (current) drug therapy: Secondary | ICD-10-CM

## 2022-06-26 DIAGNOSIS — L578 Other skin changes due to chronic exposure to nonionizing radiation: Secondary | ICD-10-CM | POA: Diagnosis not present

## 2022-06-26 DIAGNOSIS — L570 Actinic keratosis: Secondary | ICD-10-CM | POA: Diagnosis not present

## 2022-06-26 DIAGNOSIS — X32XXXA Exposure to sunlight, initial encounter: Secondary | ICD-10-CM

## 2022-06-26 DIAGNOSIS — W908XXA Exposure to other nonionizing radiation, initial encounter: Secondary | ICD-10-CM

## 2022-06-26 DIAGNOSIS — Z5111 Encounter for antineoplastic chemotherapy: Secondary | ICD-10-CM

## 2022-06-26 DIAGNOSIS — L820 Inflamed seborrheic keratosis: Secondary | ICD-10-CM

## 2022-06-26 DIAGNOSIS — Z7189 Other specified counseling: Secondary | ICD-10-CM

## 2022-06-26 NOTE — Patient Instructions (Signed)
Instructions for [facility] Medications  One or more of your medications was sent to the [facility] mail order compounding pharmacy. You will receive an email from them and can purchase the medicine through that link. It will then be mailed to your home at the address you confirmed. If for any reason you do not receive an email from them, please check your spam folder. If you still do not find the email, please let us know. [facility] phone number is [phone]. Start the end of July.    Cryotherapy Aftercare  Wash gently with soap and water everyday.   Apply Vaseline and Band-Aid daily until healed.    Due to recent changes in healthcare laws, you may see results of your pathology and/or laboratory studies on MyChart before the doctors have had a chance to review them. We understand that in some cases there may be results that are confusing or concerning to you. Please understand that not all results are received at the same time and often the doctors may need to interpret multiple results in order to provide you with the best plan of care or course of treatment. Therefore, we ask that you please give [place] 2 business days to thoroughly review all your results before contacting the office for clarification. Should we see a critical lab result, you will be contacted sooner.   If You Need Anything After Your Visit  If you have any questions or concerns for your doctor, please call our main line at [phone] and press option 4 to reach your doctor's medical assistant. If no one answers, please leave a voicemail as directed and we will return your call as soon as possible. Messages left after 4 pm will be answered the following business day.   You may also send [place] a message via MyChart. We typically respond to MyChart messages within 1-2 business days.  For prescription refills, please ask your pharmacy to contact our office. Our fax number is [phone].  If you have an urgent  issue when the clinic is closed that cannot wait until the next business day, you can page your doctor at the number below.    Please note that while we do our best to be available for urgent issues outside of office hours, we are not available 24/7.   If you have an urgent issue and are unable to reach [place], you may choose to seek medical care at your doctor's office, retail clinic, urgent care center, or emergency room.  If you have a medical emergency, please immediately call 911 or go to the emergency department.  Pager Numbers  - Dr. Gwen Pounds: [phone]  - Dr. Neale Burly: [phone]  - Dr. Roseanne Reno: [phone]  In the event of inclement weather, please call our main line at [phone] for an update on the status of any delays or closures.  Dermatology Medication Tips: Please keep the boxes that topical medications come in in order to help keep track of the instructions about where and how to use these. Pharmacies typically print the medication instructions only on the boxes and not directly on the medication tubes.   If your medication is too expensive, please contact our office at [phone] option 4 or send [place] a message through MyChart.   We are unable to tell what your co-pay for medications will be in advance as this is different depending on your insurance coverage. However, we may be able to find a substitute medication at lower cost or fill out paperwork  to get insurance to cover a needed medication.   If a prior authorization is required to get your medication covered by your insurance company, please allow [place] 1-2 business days to complete this process.  Drug prices often vary depending on where the prescription is filled and some pharmacies may offer cheaper prices.  The website www.goodrx.com contains coupons for medications through different pharmacies. The prices here do not account for what the cost may be with help from insurance (it may be cheaper with your  insurance), but the website can give you the price if you did not use any insurance.  - You can print the associated coupon and take it with your prescription to the pharmacy.  - You may also stop by our office during regular business hours and pick up a GoodRx coupon card.  - If you need your prescription sent electronically to a different pharmacy, notify our office through [facility] or by phone at [phone] option 4.     Si Usted Necesita Algo Despus de Su Visita  Tambin puede enviarnos un mensaje a travs de Pharmacist, community. Por lo general respondemos a los mensajes de MyChart en el transcurso de 1 a 2 das hbiles.  Para renovar recetas, por favor pida a su farmacia que se ponga en contacto con nuestra oficina. Harland Dingwall de fax es [place] [phone].  Si tiene un asunto urgente cuando la clnica est cerrada y que no puede esperar hasta el siguiente da hbil, puede llamar/localizar a su doctor(a) al nmero que aparece a continuacin.   Por favor, tenga en cuenta que aunque hacemos todo lo posible para estar disponibles para asuntos urgentes fuera del horario de [place], no estamos disponibles las 24 horas del da, los 7 das de la [place].   Si tiene un problema urgente y no puede comunicarse con nosotros, puede optar por buscar atencin mdica  en el consultorio de su doctor(a), en una clnica privada, en un centro de atencin urgente o en una sala de emergencias.  Si tiene Engineering geologist, por favor llame inmediatamente al 911 o vaya a la sala de emergencias.  Nmeros de bper  - Dr. Nehemiah Massed: [phone]  - Dra. Moye: [phone]  - Dra. Nicole Kindred: [phone]  En caso de inclemencias del [place], por favor llame a Johnsie Kindred principal al [phone] para una actualizacin sobre el [place] de cualquier retraso o cierre.  Consejos para la medicacin en dermatologa: Por favor, guarde las cajas en las que vienen los medicamentos de uso tpico para ayudarle a  seguir las instrucciones sobre dnde y cmo usarlos. Las farmacias generalmente imprimen las instrucciones del medicamento slo en las cajas y no directamente en los tubos del [place].   Si su medicamento es muy caro, por favor, pngase en contacto con Zigmund Daniel llamando al [phone] y presione la opcin 4 o envenos un mensaje a travs de Pharmacist, community.   No podemos decirle cul ser su copago por los medicamentos por adelantado ya que esto es diferente dependiendo de la cobertura de su seguro. Sin embargo, es posible que podamos encontrar un medicamento sustituto a Electrical engineer un formulario para que el seguro cubra el medicamento que se considera necesario.   Si se requiere una autorizacin previa para que su compaa de seguros [place] su medicamento, por favor permtanos de 1 a 2 das hbiles para completar este proceso.  Los precios de los medicamentos varan con frecuencia dependiendo del Environmental consultant de dnde se surte la receta y [place]  pueden ofrecer precios ms baratos.  El sitio web www.goodrx.com tiene cupones para medicamentos de Health and safety inspector. Los precios aqu no tienen en cuenta lo que podra costar con la ayuda del seguro (puede ser ms barato con su seguro), pero el sitio web puede darle el precio si no utiliz Tourist information centre manager.  - Puede imprimir el cupn correspondiente y llevarlo con su receta a la farmacia.  - Tambin puede pasar por nuestra oficina durante el horario de atencin regular y Education officer, museum una tarjeta de cupones de GoodRx.  - Si necesita que su receta se enve electrnicamente a una farmacia diferente, informe a nuestra oficina a travs de MyChart de [place] o por telfono llamando al [phone] y presione la opcin 4.

## 2022-06-26 NOTE — Progress Notes (Addendum)
Referring Physician:  Allegra Grana, [name] [address] [place],  [place] [address]  Primary Physician:  Allegra Grana, [name]  History of Present Illness: 06/26/2022 [name] has a history of CAD, DM, GERD, heart murmur, MI, hyperlipidemia, HTN, and melanoma.   He has history of chronic back pain x years that has been worse in last year.   Last seen by me in on 05/30/22 for constant LBP with right lateral thigh pain that is worse with activity. LBP was his primary complaint.   He has known slip at L4-L5 with lumbar spondylosis.   CT of his lumbar spine was ordered (cannot have MRI due to pacemaker and defibrillator) and he is here to review it.  He continues with constant LBP with right lateral thigh pain that is worse with activity. He is starting to have some pain in his left thigh as well. LBP is primary complaint.   He started doing a HEP from previous PT for his back. He notes spasms in his back with walking. He notes some "jerking" of legs at night, usually on right. He has diabetic neuropathy in both feet. No weakness in legs.   He notes some calf swelling for the last few weeks on the right leg. History of superficial phlebitis in the past. No pain or redness.     He is on PLAVIX. Has defibrillator and pacemaker    Bowel/Bladder Dysfunction: none  Smokes occasional pipe.   Conservative measures:  Physical therapy:  went to PT about 2 years ago - doesn't remember where Multimodal medical therapy including regular antiinflammatories: neurontin, ultram  Injections: one lumbar injection about 10 years ago - "very helpful"  Past Surgery: no prior spine surgery  Doral Wenhold has no symptoms of cervical myelopathy.  The symptoms are causing a significant impact on the patient's life.   Review of Systems:  A 10 point review of systems is negative, except for the pertinent positives and negatives detailed in the HPI.  Past Medical  History: Past Medical History:  Diagnosis Date   Aortic insufficiency    a. noted on TTE 2015; b. 06/2019 Echo: AI not visualized.   Arthritis    CAD (coronary artery disease)    a. remote PCI in 1991 and 2005; b. MV 3/15: old inferior MI, no ischemia, LVEF 50%, slight inferior wall hypokniesis; c. 06/2019 NSTEMI/PCI: LM nl, LAD 80p/m (Atherectomy & 4.5x18 Resolute Onyx DES), 50m/d, D1 75 (PTCA), RI patent stent, LCX nl, RCA 100p, RPAV fills via L->R collats from LCX.   Chicken pox    Colon polyps    4 pre-cancerous    Diverticulitis    DM type 2 (diabetes mellitus, type 2) (HCC)    Family history of adverse reaction to anesthesia    GERD (gastroesophageal reflux disease)    Heart murmur    History of kidney stones    HOH (hard of hearing)    Hyperlipidemia    Hypertension    Ischemic cardiomyopathy    a. TTE 2015: EF  50-55%, mild global HK; b. 06/2019 Echo: EF 45-50%, Gr1 DD, basal inf AK. Triv MR.   Kidney stones    Melanoma (HCC) 1980   Resected from his back   Mitral regurgitation    a. noted on TTE 2015   Myocardial infarction [facility])     Past Surgical History: Past Surgical History:  Procedure Laterality Date   BIV UPGRADE N/A 02/26/2021   Procedure: BIV ICD UPGRADE;  Surgeon: Lanier Prude, MD;  Location: [facility] INVASIVE CV LAB;  Service: Cardiovascular;  Laterality: N/A;   CHOLECYSTECTOMY  2012   COLONOSCOPY     in 2003 with polyp removed and leak anastomosis had to have open abdominal surgery    CORONARY ANGIOPLASTY WITH STENT PLACEMENT  1991 & 2005   CORONARY ATHERECTOMY N/A 07/08/2019   Procedure: CORONARY ATHERECTOMY;  Surgeon: Corky Crafts, MD;  Location: [facility] INVASIVE CV LAB;  Service: Cardiovascular;  Laterality: N/A;   CORONARY BALLOON ANGIOPLASTY N/A 07/08/2019   Procedure: CORONARY BALLOON ANGIOPLASTY;  Surgeon: Corky Crafts, MD;  Location: [facility] INVASIVE CV LAB;  Service: Cardiovascular;  Laterality: N/A;  diagonal    CORONARY PRESSURE/FFR STUDY N/A  07/07/2019   Procedure: INTRAVASCULAR PRESSURE WIRE/FFR STUDY;  Surgeon: Yvonne Kendall, MD;  Location: [facility] INVASIVE CV LAB;  Service: Cardiovascular;  Laterality: N/A;   CORONARY STENT INTERVENTION N/A 07/08/2019   Procedure: CORONARY STENT INTERVENTION;  Surgeon: Corky Crafts, MD;  Location: [facility] INVASIVE CV LAB;  Service: Cardiovascular;  Laterality: N/A;  lad   CORONARY ULTRASOUND/IVUS N/A 07/08/2019   Procedure: Intravascular Ultrasound/IVUS;  Surgeon: Corky Crafts, MD;  Location: [facility] INVASIVE CV LAB;  Service: Cardiovascular;  Laterality: N/A;   CYSTOSCOPY/URETEROSCOPY/HOLMIUM LASER/STENT PLACEMENT Right 01/02/2018   Procedure: CYSTOSCOPY/URETEROSCOPY/HOLMIUM LASER/STENT PLACEMENT;  Surgeon: Sondra Come, MD;  Location: [facility] ORS;  Service: Urology;  Laterality: Right;   ESOPHAGOGASTRODUODENOSCOPY (EGD) WITH PROPOFOL N/A 11/19/2016   Procedure: ESOPHAGOGASTRODUODENOSCOPY (EGD) WITH PROPOFOL;  Surgeon: Midge Minium, MD;  Location: [facility] ENDOSCOPY;  Service: Endoscopy;  Laterality: N/A;   ESOPHAGOGASTRODUODENOSCOPY (EGD) WITH PROPOFOL N/A 02/02/2019   Procedure: ESOPHAGOGASTRODUODENOSCOPY (EGD) WITH PROPOFOL;  Surgeon: Midge Minium, MD;  Location: [facility] ENDOSCOPY;  Service: Endoscopy;  Laterality: N/A;   ICD IMPLANT N/A 02/02/2020   Procedure: ICD IMPLANT;  Surgeon: Lanier Prude, MD;  Location: [facility] INVASIVE CV LAB;  Service: Cardiovascular;  Laterality: N/A;   LEAD REVISION/REPAIR N/A 02/16/2020   Procedure: LEAD REVISION/REPAIR;  Surgeon: Regan Lemming, MD;  Location: [facility] INVASIVE CV LAB;  Service: Cardiovascular;  Laterality: N/A;   LEFT HEART CATH N/A 07/08/2019   Procedure: Left Heart Cath;  Surgeon: Corky Crafts, MD;  Location: [facility] INVASIVE CV LAB;  Service: Cardiovascular;  Laterality: N/A;   LEFT HEART CATH AND CORONARY ANGIOGRAPHY N/A 07/07/2019   Procedure: LEFT HEART CATH AND CORONARY ANGIOGRAPHY;  Surgeon: Yvonne Kendall, MD;  Location: [facility] INVASIVE CV  LAB;  Service: Cardiovascular;  Laterality: N/A;   LITHOTRIPSY  2015   MELANOMA EXCISION  1980   malignant   NERVE SURGERY  2015   ulna nerve   TONSILLECTOMY  1945   WISDOM TOOTH EXTRACTION      Allergies: Allergies as of 06/28/2022 - Review Complete 06/05/2022  Allergen Reaction Noted   Metformin and related Diarrhea and Other (See Comments) 07/13/2018   Azithromycin Other (See Comments) 07/07/2015   Other Other (See Comments) 07/07/2019   Percocet [oxycodone-acetaminophen] Other (See Comments) 07/07/2015   Jardiance [empagliflozin] Other (See Comments) 08/03/2019    Medications: Outpatient Encounter Medications as of 06/28/2022  Medication Sig   amiodarone (PACERONE) 200 MG tablet Take 1 tablet (200 mg total) by mouth daily.   aspirin EC 81 MG tablet Take 1 tablet (81 mg total) by mouth daily. Swallow whole.   bisacodyl (DULCOLAX) 5 MG EC tablet Take 5 mg by mouth daily as needed for moderate constipation.   carvedilol (COREG) 3.125 MG tablet TAKE 1 TABLET(3.125 MG) BY MOUTH TWICE  DAILY   clopidogrel (PLAVIX) 75 MG tablet TAKE 1 TABLET(75 MG) BY MOUTH DAILY   docusate sodium (COLACE) 100 MG capsule Take 100 mg by mouth in the morning, at noon, and at bedtime.   ezetimibe (ZETIA) 10 MG tablet TAKE 1 TABLET(10 MG) BY MOUTH DAILY   famotidine (PEPCID) 20 MG tablet Take 20 mg by mouth daily with lunch.   fluticasone (FLONASE) 50 MCG/ACT nasal spray Place into both nostrils daily as needed for allergies or rhinitis.   gabapentin (NEURONTIN) 100 MG capsule TAKE 1 CAPSULE(100 MG) BY MOUTH THREE TIMES DAILY   glipiZIDE (GLUCOTROL) 10 MG tablet TAKE 1/2 TABLET BY MOUTH EVERY MORNING WITH BREAKFAST AND 1 TABLET BY MOUTH EVERY EVENING WITH SUPPER   lisinopril (ZESTRIL) 10 MG tablet TAKE 1 TABLET BY MOUTH DAILY   midodrine (PROAMATINE) 5 MG tablet Take 1 tablet (5 mg total) by mouth 2 (two) times daily as needed. Take when BP does drop less than 110 when standing. Additional dose at 2 pm if  still dropping below 110 when standing   Multiple Vitamin (MULTIVITAMIN) tablet Take 1 tablet by mouth daily with lunch.   Omega-3 Fatty Acids (FISH OIL) 1000 MG CAPS Take 1,000 mg by mouth daily with lunch.   Omega-3 Fatty Acids (FISH OIL) 500 MG CAPS Take 500 mg by mouth every other day.   ONETOUCH VERIO test strip USE TO CHECK BLOOD SUGAR TWICE DAILY AS DIRECTED   pantoprazole (PROTONIX) 40 MG tablet Take 1 tablet (40 mg total) by mouth daily.   psyllium (METAMUCIL) 58.6 % powder Take 1 packet by mouth at bedtime.   rosuvastatin (CRESTOR) 10 MG tablet Take 1 tablet (10 mg total) by mouth every other day.   Semaglutide, 1 MG/DOSE, 4 MG/3ML SOPN Inject 1 mg as directed once a week.   sodium chloride (OCEAN) 0.65 % SOLN nasal spray Place 1 spray into both nostrils as needed for congestion.   spironolactone (ALDACTONE) 25 MG tablet TAKE 1 TABLET(25 MG) BY MOUTH DAILY   tamsulosin (FLOMAX) 0.4 MG CAPS capsule TAKE ONE CAPSULE BY MOUTH DAILY   traMADol (ULTRAM) 50 MG tablet TAKE 1 TABLET BY MOUTH EVERY 12 HOURS AS NEEDED   No facility-administered encounter medications on file as of 06/28/2022.    Social History: Social History   Tobacco Use   Smoking status: Some Days    Types: Pipe    Passive exposure: Current   Smokeless tobacco: Former    Quit date: 10/17/2015  Vaping Use   Vaping Use: Never used  Substance Use Topics   Alcohol use: Yes    Alcohol/week: 0.0 - 1.0 standard drinks of alcohol    Comment: OCCASIONALLY   Drug use: No    Family Medical History: Family History  Problem Relation Age of Onset   Hyperlipidemia Mother    Hypertension Mother    Heart disease Mother    Diabetes Mother    Heart attack Mother    Colon cancer Father        metasized to liver, adrenal, lungs   Lung cancer Father    Kidney cancer Father        malignant capsulated kidney tumor   Diabetes Father    Liver cancer Father    Bladder Cancer Neg Hx    Prostate cancer Neg Hx     Physical  Examination: There were no vitals filed for this visit.   General: Patient is well developed, well nourished, calm, collected, and in no apparent distress.  Attention to examination is appropriate.  Respiratory: Patient is breathing without any difficulty.   NEUROLOGICAL:     Awake, alert, oriented to person, place, and time.  Speech is clear and fluent. Fund of knowledge is appropriate.   Cranial Nerves: Pupils equal round and reactive to light.  Facial tone is symmetric.   No abnormal lesions on exposed skin.   Strength:  Side Iliopsoas Quads Hamstring PF DF EHL  R 5 5 5 5 5 5   L 5 5 5 5 5 5     Bilateral lower extremity sensation is intact to light touch.     He has minimal swelling in right leg from knee down in comparison to left. No calf tenderness. No redness noted.   Gait is slow.   Medical Decision Making  Imaging: Lumbar CT scan dated 06/12/22:  FINDINGS: Segmentation: 5 lumbar vertebrae. The caudal most well-formed intervertebral disc space is designated L5-S1.   Alignment: Slight grade 1 retrolisthesis at L1-L2 and L2-L3. 7 mm L4-L5 grade 1 anterolisthesis. Slight grade 1 retrolisthesis at L5-S1.   Vertebrae: Vertebral body height is maintained. No lumbar spine fracture.   Paraspinal and other soft tissues: Bilateral nephrolithiasis. Known bilateral fluid-filled renal lesions, some with associated rim calcification, incompletely imaged and incompletely assessed on the current exam. Aortoiliac atherosclerosis. No paraspinal mass or collection.   Disc levels:   Multilevel disc space narrowing, greatest at L4-L5 and L5-S1 (mild-to-moderate at these levels). L5-S1 disc vacuum phenomenon.   T12-L1: No significant disc herniation or stenosis.   L1-L2: Partially calcified disc bulge. Mild effacement of the ventral thecal sac. No significant foraminal stenosis.   L2-L3: Disc bulge. Mild facet arthrosis. No significant spinal canal stenosis. Mild relative  right neural foraminal narrowing.   L3-L4: Disc bulge with mild endplate spurring. Ligamentum flavum hypertrophy. Mild bilateral subarticular narrowing. No significant central canal stenosis. Mild-to-moderate bilateral neural foraminal narrowing.   L4-L5: 7 mm grade 1 anterolisthesis. Disc uncovering with disc bulge. Advanced facet arthrosis. Bilateral subarticular narrowing. Mild relative narrowing of the central canal. Moderate bilateral neural foraminal narrowing with potential to affect either exiting L4 nerve root.   L5-S1: Disc bulge. Mild facet arthrosis. No significant spinal canal stenosis. Mild relative left neural foraminal narrowing.   Other: Osseous fusion across the anterior aspect of the right sacroiliac joint.   IMPRESSION: 1. Lumbar spondylosis, as outlined and with findings most notably as follows. 2. At L4-L5, there is 7 mm grade 1 anterolisthesis. Mild-to-moderate disc degeneration. Disc uncovering with disc bulge. Advanced facet arthrosis. Bilateral subarticular narrowing. Mild relative narrowing of the central canal. Moderate bilateral neural foraminal narrowing with potential to affect either exiting L4 nerve root. 3. No more than mild spinal canal narrowing at the remaining lumbar levels. Additional sites of foraminal stenosis, as described and greatest bilaterally at L3-L4 (mild-to-moderate at this level). 4. Osseous fusion across the anterior aspect of the right sacroiliac joint. 5. Bilateral nephrolithiasis. 6. Known bilateral fluid-filled renal lesions, some with associated rim calcification, incompletely imaged and incompletely assessed on the current exam. Please refer to the prior CT abdomen/pelvis of 03/16/2021 for further description and for follow-up recommendations. 7.  Aortic Atherosclerosis (ICD10-I70.0).     Electronically Signed   By: Jackey Loge D.O.   On: 06/24/2022 09:29    I have personally reviewed the images and agree with the  above interpretation.  Assessment and Plan: Mr. Stawski is a pleasant 82 y.o. male that has constant LBP with right > left lateral thigh pain  that is worse with activity/walking. LBP is primary complaint- he can't walk a block without pain.   He has known slip at L4-L5 mild central stenosis and moderate bilateral foraminal stenosis. Also with bilateral foraminal stenosis at L3-L4 as well. Primary pain generator is likely L4-L5.   Treatment options discussed with patient and following plan made:   - PT for lumbar spine. Orders to [facility].  - Discussed lumbar injections, he wants to try PT first.  - Having back spasms and spasms at night- trial of flexeril given. Will try 1/2 pill (5mg ) first. Reviewed dosing and side effects. He knows this can make him sleepy. He has taken in past with good results.  - He has few weeks of minimal right calf swelling. No pain or redness. No tenderness on exam. Advised to follow up with PCP or go to UC regarding this. Message sent to PCP.  - Medications limited as he is on PLAVIX.  - Follow up with me in 6 weeks for recheck.   I spent a total of 25 minutes in face-to-face and non-face-to-face activities related to this patient's care today including review of outside records, review of imaging, review of symptoms, physical exam, discussion of differential diagnosis, discussion of treatment options, and documentation.   Drake Leach PA-C Dept. of Neurosurgery

## 2022-06-26 NOTE — Progress Notes (Signed)
Follow-Up Visit   Subjective  [name] [name] is a 82 y.o. male who presents for the following: AK follow up of scalp treated with LN2 and 5FU/Calcipotriene The patient has spots, moles and lesions to be evaluated, some may be new or changing and the patient may have concern these could be cancer.  The following portions of the chart were reviewed this encounter and updated as appropriate: medications, allergies, medical history  Review of Systems:  No other skin or systemic complaints except as noted in HPI or Assessment and Plan.  Objective  Well appearing patient in no apparent distress; mood and affect are within normal limits. A focused examination was performed of the following areas: scalp, face Relevant exam findings are noted in the Assessment and Plan.  Scalp, face (20) Erythematous thin papules/macules with gritty scale.   Scalp (6) Erythematous stuck-on, waxy papule or plaque   Assessment & Plan    AK (actinic keratosis) (20) Scalp, face  ACTINIC DAMAGE WITH PRECANCEROUS ACTINIC KERATOSES Counseling for Topical Chemotherapy Management: Patient exhibits: - Severe, confluent actinic changes with pre-cancerous actinic keratoses that is secondary to cumulative UV radiation exposure over time - Condition that is severe; chronic, not at goal. - diffuse scaly erythematous macules and papules with underlying dyspigmentation - Discussed Prescription "Field Treatment" topical Chemotherapy for Severe, Chronic Confluent Actinic Changes with Pre-Cancerous Actinic Keratoses Field treatment involves treatment of an entire area of skin that has confluent Actinic Changes (Sun/ Ultraviolet light damage) and PreCancerous Actinic Keratoses by method of PhotoDynamic Therapy (PDT) and/or prescription Topical Chemotherapy agents such as 5-fluorouracil, 5-fluorouracil/calcipotriene, and/or imiquimod.  The purpose is to decrease the number of clinically evident and subclinical PreCancerous  lesions to prevent progression to development of skin cancer by chemically destroying early precancer changes that may or may not be visible.  It has been shown to reduce the risk of developing skin cancer in the treated area. As a result of treatment, redness, scaling, crusting, and open sores may occur during treatment course. One or more than one of these methods may be used and may have to be used several times to control, suppress and eliminate the PreCancerous changes. Discussed treatment course, expected reaction, and possible side effects. - Recommend daily broad spectrum sunscreen SPF 30+ to sun-exposed areas, reapply every 2 hours as needed.  - Staying in the shade or wearing long sleeves, sun glasses (UVA+UVB protection) and wide brim hats (4-inch brim around the entire circumference of the hat) are also recommended. - Call for new or changing lesions.  Apply 5-fluorouracil/calcipotriene cream twice a day for 7 days to affected areas including scalp. Prescription sent to [facility]. Patient advised they will receive an email to purchase the medication online and have it sent to their home. Patient provided with handout reviewing treatment course and side effects and advised to call or message [place] on MyChart with any concerns.   Destruction of lesion - Scalp, face Complexity: simple   Destruction method: cryotherapy   Informed consent: discussed and consent obtained   Timeout:  patient name, date of birth, surgical site, and procedure verified Lesion destroyed using liquid nitrogen: Yes   Region frozen until ice ball extended beyond lesion: Yes   Outcome: patient tolerated procedure well with no complications   Post-procedure details: wound care instructions given    Inflamed seborrheic keratosis (6) Scalp  Symptomatic, irritating, patient would like treated.  Benign-appearing.  Call clinic for new or changing lesions.   Prior to procedure,  discussed risks  of blister formation, small wound, skin dyspigmentation, or rare scar following treatment. Recommend Vaseline ointment to treated areas while healing.   Destruction of lesion - Scalp Complexity: simple   Destruction method: cryotherapy   Informed consent: discussed and consent obtained   Timeout:  patient name, date of birth, surgical site, and procedure verified Lesion destroyed using liquid nitrogen: Yes   Region frozen until ice ball extended beyond lesion: Yes   Outcome: patient tolerated procedure well with no complications   Post-procedure details: wound care instructions given     Return in about 8 months (around 02/26/2023).  I, Joanie Coddington, CMA, am acting as scribe for Armida Sans, MD .  Documentation: I have reviewed the above documentation for accuracy and completeness, and I agree with the above.  Armida Sans, MD

## 2022-06-28 ENCOUNTER — Encounter: Payer: Self-pay | Admitting: Orthopedic Surgery

## 2022-06-28 ENCOUNTER — Ambulatory Visit (INDEPENDENT_AMBULATORY_CARE_PROVIDER_SITE_OTHER): Payer: Medicare Other | Admitting: Orthopedic Surgery

## 2022-06-28 ENCOUNTER — Encounter: Payer: Self-pay | Admitting: Dermatology

## 2022-06-28 VITALS — BP 134/80 | Ht 73.0 in | Wt 212.0 lb

## 2022-06-28 DIAGNOSIS — M4726 Other spondylosis with radiculopathy, lumbar region: Secondary | ICD-10-CM

## 2022-06-28 DIAGNOSIS — M4316 Spondylolisthesis, lumbar region: Secondary | ICD-10-CM

## 2022-06-28 DIAGNOSIS — M47816 Spondylosis without myelopathy or radiculopathy, lumbar region: Secondary | ICD-10-CM

## 2022-06-28 DIAGNOSIS — M5416 Radiculopathy, lumbar region: Secondary | ICD-10-CM

## 2022-06-28 MED ORDER — CYCLOBENZAPRINE HCL 10 MG PO TABS
5.0000 mg | ORAL_TABLET | Freq: Three times a day (TID) | ORAL | 0 refills | Status: DC | PRN
Start: 2022-06-28 — End: 2022-08-02

## 2022-06-28 NOTE — Patient Instructions (Signed)
It was so nice to see you today. Thank you so much for coming in.    You have some wear and tear in your back and I think this may be causing your pain.   I sent physical therapy orders to [facility] on [address]. You can call them at [phone] if you don't hear from them to schedule your visit.   I also sent a prescription for cyclobenzaprine to help with muscle spasms. Use only as needed and be careful, this can make you sleepy. I would try taking 1/2 a pill first.   Contact your PCP or go to an Urgent Care regarding the calf swelling as further treatment would come from them.   If pain gets worse, we can consider injections in your back.   I will see you back in 6 weeks. Please do not hesitate to call if you have any questions or concerns. You can also message me in MyChart.   Drake Leach PA-C [phone]

## 2022-07-01 ENCOUNTER — Telehealth: Payer: Self-pay | Admitting: Family

## 2022-07-01 NOTE — Telephone Encounter (Signed)
Spoke to pt and scheduled him for 07/03/22. Pt states that he has sob anytime but it is due to heart issue

## 2022-07-01 NOTE — Telephone Encounter (Signed)
Call pt  Hematology informed me of mild right calf swelling.  Triage to ensure no SOB, concern for infection  Sch appt with me

## 2022-07-03 ENCOUNTER — Encounter: Payer: Self-pay | Admitting: Family

## 2022-07-03 ENCOUNTER — Ambulatory Visit (INDEPENDENT_AMBULATORY_CARE_PROVIDER_SITE_OTHER): Payer: Medicare Other | Admitting: Family

## 2022-07-03 ENCOUNTER — Ambulatory Visit
Admission: RE | Admit: 2022-07-03 | Discharge: 2022-07-03 | Disposition: A | Discharge status: Home / Self Care | Payer: Medicare Other | Source: Ambulatory Visit | Attending: Family | Admitting: Family

## 2022-07-03 VITALS — BP 132/70 | HR 96 | Temp 97.6°F | Ht 73.0 in | Wt 210.8 lb

## 2022-07-03 DIAGNOSIS — R600 Localized edema: Secondary | ICD-10-CM | POA: Diagnosis not present

## 2022-07-03 DIAGNOSIS — M7989 Other specified soft tissue disorders: Secondary | ICD-10-CM | POA: Diagnosis not present

## 2022-07-03 NOTE — Assessment & Plan Note (Signed)
Difference in right calf size when compared to left.  Pending stat ultrasound to rule out DVT.  Discussed symptoms suggestive of  restless legs.  At this time Flexeril 5mg  qhs has been helpful, advised to continue.  Advised to have ferritin checked at follow-up with routine labs.

## 2022-07-03 NOTE — Progress Notes (Signed)
Assessment & Plan:  Right leg swelling Assessment & Plan: Difference in right calf size when compared to left.  Pending stat ultrasound to rule out DVT.  Discussed symptoms suggestive of  restless legs.  At this time Flexeril 5mg  qhs has been helpful, advised to continue.  Advised to have ferritin checked at follow-up with routine labs.   Orders: -     US Venous Img Lower Unilateral Right (DVT); Future     Return precautions given.   Risks, benefits, and alternatives of the medications and treatment plan prescribed today were discussed, and patient expressed understanding.   Education regarding symptom management and diagnosis given to patient on AVS either electronically or printed.  No follow-ups on file.  [name] Plowman, [name]  Subjective:    Patient ID: [name] [name], male    DOB: 21-Jan-1940, 82 y.o.   MRN: [id number]  CC: [name] [name] is a 82 y.o. male who presents today for an acute visit.    HPI: Accompanied by wife  Complains of right lower leg swelling x 10 days  No pain in legs.  Endorses swelling is in 'conjunction' with legs moving at night.   He noticed and measured a 1'' difference in calf circumference.   He is taking flexeril 5mg  with relief of leg movement at night.  He is starting PT for low back pain  Shortness of breath is at baseline.  No acute change.  No skin laceration, fever  No recent travel, immobilization, surgery.       Allergies: Metformin and related, Azithromycin, Other, Percocet [oxycodone-acetaminophen], and Jardiance [empagliflozin] Current Outpatient Medications on File Prior to Visit  Medication Sig Dispense Refill   amiodarone (PACERONE) 200 MG tablet Take 1 tablet (200 mg total) by mouth daily. 90 tablet 3   aspirin EC 81 MG tablet Take 1 tablet (81 mg total) by mouth daily. Swallow whole. 90 tablet 3   bisacodyl (DULCOLAX) 5 MG EC tablet Take 5 mg by mouth daily as needed for moderate constipation.     carvedilol  (COREG) 3.125 MG tablet TAKE 1 TABLET(3.125 MG) BY MOUTH TWICE DAILY 180 tablet 1   clopidogrel (PLAVIX) 75 MG tablet TAKE 1 TABLET(75 MG) BY MOUTH DAILY 30 tablet 8   cyclobenzaprine (FLEXERIL) 10 MG tablet Take 0.5-1 tablets (5-10 mg total) by mouth 3 (three) times daily as needed for muscle spasms. This can make you sleepy. 30 tablet 0   docusate sodium (COLACE) 100 MG capsule Take 100 mg by mouth in the morning, at noon, and at bedtime.     ezetimibe (ZETIA) 10 MG tablet TAKE 1 TABLET(10 MG) BY MOUTH DAILY 90 tablet 2   famotidine (PEPCID) 20 MG tablet Take 20 mg by mouth daily with lunch.     fluticasone (FLONASE) 50 MCG/ACT nasal spray Place into both nostrils daily as needed for allergies or rhinitis.     gabapentin (NEURONTIN) 100 MG capsule TAKE 1 CAPSULE(100 MG) BY MOUTH THREE TIMES DAILY 90 capsule 3   glipiZIDE (GLUCOTROL) 10 MG tablet TAKE 1/2 TABLET BY MOUTH EVERY MORNING WITH BREAKFAST AND 1 TABLET BY MOUTH EVERY EVENING WITH SUPPER 120 tablet 3   lisinopril (ZESTRIL) 10 MG tablet TAKE 1 TABLET BY MOUTH DAILY 90 tablet 1   midodrine (PROAMATINE) 5 MG tablet Take 1 tablet (5 mg total) by mouth 2 (two) times daily as needed. Take when BP does drop less than 110 when standing. Additional dose at 2 pm if still dropping below 110 when standing 90  tablet 3   Multiple Vitamin (MULTIVITAMIN) tablet Take 1 tablet by mouth daily with lunch.     Omega-3 Fatty Acids (FISH OIL) 1000 MG CAPS Take 1,000 mg by mouth daily with lunch.     Omega-3 Fatty Acids (FISH OIL) 500 MG CAPS Take 500 mg by mouth every other day.     ONETOUCH VERIO test strip USE TO CHECK BLOOD SUGAR TWICE DAILY AS DIRECTED 100 strip 1   pantoprazole (PROTONIX) 40 MG tablet Take 1 tablet (40 mg total) by mouth daily. 90 tablet 3   psyllium (METAMUCIL) 58.6 % powder Take 1 packet by mouth at bedtime.     rosuvastatin (CRESTOR) 10 MG tablet Take 1 tablet (10 mg total) by mouth every other day. 90 tablet 1   Semaglutide, 1 MG/DOSE,  4 MG/3ML SOPN Inject 1 mg as directed once a week. 3 mL 3   sodium chloride (OCEAN) 0.65 % SOLN nasal spray Place 1 spray into both nostrils as needed for congestion.     spironolactone (ALDACTONE) 25 MG tablet TAKE 1 TABLET(25 MG) BY MOUTH DAILY 90 tablet 2   tamsulosin (FLOMAX) 0.4 MG CAPS capsule TAKE ONE CAPSULE BY MOUTH DAILY 90 capsule 3   traMADol (ULTRAM) 50 MG tablet TAKE 1 TABLET BY MOUTH EVERY 12 HOURS AS NEEDED 60 tablet 2   No current facility-administered medications on file prior to visit.    Review of Systems  Constitutional:  Negative for chills and fever.  Respiratory:  Negative for cough and shortness of breath (at baseline).   Cardiovascular:  Positive for leg swelling. Negative for chest pain and palpitations.  Gastrointestinal:  Negative for nausea and vomiting.      Objective:    BP 132/70   Pulse 96   Temp 97.6 F (36.4 C) (Oral)   Ht 6\' 1"  (1.854 m)   Wt 210 lb 12.8 oz (95.6 kg)   SpO2 97%   BMI 27.81 kg/m   BP Readings from Last 3 Encounters:  07/03/22 132/70  06/28/22 134/80  06/26/22 115/69   Wt Readings from Last 3 Encounters:  07/03/22 210 lb 12.8 oz (95.6 kg)  06/28/22 212 lb (96.2 kg)  05/30/22 212 lb (96.2 kg)    Physical Exam Vitals reviewed.  Constitutional:      Appearance: He is well-developed.  Cardiovascular:     Rate and Rhythm: Regular rhythm.     Heart sounds: Normal heart sounds.     Comments: No LE edema, palpable cords or masses. No erythema or increased warmth. Asymmetry in calf size when compared bilaterally R > L LE hair growth symmetric and present. No discoloration. Varicosities noted. LE warm and palpable pedal pulses.  Pulmonary:     Effort: Pulmonary effort is normal. No respiratory distress.     Breath sounds: Normal breath sounds. No wheezing, rhonchi or rales.  Skin:    General: Skin is warm and dry.  Neurological:     Mental Status: He is alert.  Psychiatric:        Speech: Speech normal.         Behavior: Behavior normal.

## 2022-07-03 NOTE — Progress Notes (Signed)
[place] of riht leg for pt in office

## 2022-07-10 DIAGNOSIS — M4316 Spondylolisthesis, lumbar region: Secondary | ICD-10-CM | POA: Diagnosis not present

## 2022-07-10 DIAGNOSIS — M5416 Radiculopathy, lumbar region: Secondary | ICD-10-CM | POA: Diagnosis not present

## 2022-08-02 ENCOUNTER — Encounter: Payer: Self-pay | Admitting: Family

## 2022-08-02 ENCOUNTER — Other Ambulatory Visit: Payer: Self-pay | Admitting: Orthopedic Surgery

## 2022-08-02 DIAGNOSIS — M47816 Spondylosis without myelopathy or radiculopathy, lumbar region: Secondary | ICD-10-CM

## 2022-08-02 DIAGNOSIS — M5416 Radiculopathy, lumbar region: Secondary | ICD-10-CM

## 2022-08-02 DIAGNOSIS — M4316 Spondylolisthesis, lumbar region: Secondary | ICD-10-CM

## 2022-08-02 MED ORDER — CYCLOBENZAPRINE HCL 5 MG PO TABS
5.0000 mg | ORAL_TABLET | Freq: Three times a day (TID) | ORAL | 0 refills | Status: DC | PRN
Start: 2022-08-02 — End: 2022-08-12

## 2022-08-02 MED ORDER — BLOOD GLUCOSE MONITORING SUPPL DEVI: 1.0000 | Freq: Two times a day (BID) | 0 refills | Status: DC

## 2022-08-02 NOTE — Telephone Encounter (Signed)
Spoke with patient. He would like the 5mg . Hard to cut 10mg  in half. He is taking at night mostly, but will keep directions at tid prn.   Refill sent to pharmacy. He is aware.

## 2022-08-08 NOTE — Progress Notes (Signed)
Referring Physician:  No referring provider defined for this encounter.  Primary Physician:  Allegra Grana, [name]  History of Present Illness: 08/12/2022 [name] has a history of CAD, DM, GERD, heart murmur, MI, hyperlipidemia, HTN, and melanoma.   He has history of chronic back pain x years that has been worse in last year.   Last seen by me in on 06/28/22 for constant LBP with right lateral thigh pain that is worse with activity. LBP was his primary complaint.   He has known slip at L4-L5 mild central stenosis and moderate bilateral foraminal stenosis. Also with bilateral foraminal stenosis at L3-L4 as well. Primary pain generator is likely L4-L5.   He was sent to PT. Discussed injections he declined. He was given flexeril to take prn at night.   He is here for follow up.   He did 7 visits of PT at [place] with 80% improvement he was discharged on 08/01/22. He stopped flexeril due to side effects. He is taking neurontin 100mg  at night and not having any pain in the right lateral thigh. He still has intermittent LBP that is worse with lifting, prolonged walking. This pain is more tolerable. He is standing up straight and not having to lean forward.   He has diabetic neuropathy in both feet. No weakness in legs.   He is on PLAVIX. Has defibrillator and pacemaker    Smokes occasional pipe.   Conservative measures:  Physical therapy: 7 visits of PT at [place] with 80% improvement he was discharged on 08/01/22 Multimodal medical therapy including regular antiinflammatories: neurontin, ultram  Injections: one lumbar injection about 10 years ago - "very helpful"  Past Surgery: no prior spine surgery  [name] [name] has no symptoms of cervical myelopathy.  The symptoms are causing a significant impact on the patient's life.   Review of Systems:  A 10 point review of systems is negative, except for the pertinent positives and negatives detailed in the HPI.  Past  Medical History: Past Medical History:  Diagnosis Date   Aortic insufficiency    a. noted on TTE 2015; b. 06/2019 Echo: AI not visualized.   Arthritis    CAD (coronary artery disease)    a. remote PCI in 1991 and 2005; b. MV 3/15: old inferior MI, no ischemia, LVEF 50%, slight inferior wall hypokniesis; c. 06/2019 NSTEMI/PCI: LM nl, LAD 80p/m (Atherectomy & 4.5x18 Resolute Onyx DES), 84m/d, D1 75 (PTCA), RI patent stent, LCX nl, RCA 100p, RPAV fills via L->R collats from LCX.   Chicken pox    Colon polyps    4 pre-cancerous    Diverticulitis    DM type 2 (diabetes mellitus, type 2) (HCC)    Family history of adverse reaction to anesthesia    GERD (gastroesophageal reflux disease)    Heart murmur    History of kidney stones    HOH (hard of hearing)    Hyperlipidemia    Hypertension    Ischemic cardiomyopathy    a. TTE 2015: EF  50-55%, mild global HK; b. 06/2019 Echo: EF 45-50%, Gr1 DD, basal inf AK. Triv MR.   Kidney stones    Melanoma (HCC) 1980   Resected from his back   Mitral regurgitation    a. noted on TTE 2015   Myocardial infarction [facility])     Past Surgical History: Past Surgical History:  Procedure Laterality Date   BIV UPGRADE N/A 02/26/2021   Procedure: BIV ICD UPGRADE;  Surgeon: Lanier Prude, MD;  Location: [facility] INVASIVE CV LAB;  Service: Cardiovascular;  Laterality: N/A;   CHOLECYSTECTOMY  2012   COLONOSCOPY     in 2003 with polyp removed and leak anastomosis had to have open abdominal surgery    CORONARY ANGIOPLASTY WITH STENT PLACEMENT  1991 & 2005   CORONARY ATHERECTOMY N/A 07/08/2019   Procedure: CORONARY ATHERECTOMY;  Surgeon: Corky Crafts, MD;  Location: [facility] INVASIVE CV LAB;  Service: Cardiovascular;  Laterality: N/A;   CORONARY BALLOON ANGIOPLASTY N/A 07/08/2019   Procedure: CORONARY BALLOON ANGIOPLASTY;  Surgeon: Corky Crafts, MD;  Location: [facility] INVASIVE CV LAB;  Service: Cardiovascular;  Laterality: N/A;  diagonal    CORONARY PRESSURE/FFR  STUDY N/A 07/07/2019   Procedure: INTRAVASCULAR PRESSURE WIRE/FFR STUDY;  Surgeon: Yvonne Kendall, MD;  Location: [facility] INVASIVE CV LAB;  Service: Cardiovascular;  Laterality: N/A;   CORONARY STENT INTERVENTION N/A 07/08/2019   Procedure: CORONARY STENT INTERVENTION;  Surgeon: Corky Crafts, MD;  Location[facility] INVASIVE CV LAB;  Service: Cardiovascular;  Laterality: N/A;  lad   CORONARY ULTRASOUND/IVUS N/A 07/08/2019   Procedure: Intravascular Ultrasound/IVUS;  Surgeon: Corky Crafts, MD;  Location: [facility] INVASIVE CV LAB;  Service: Cardiovascular;  Laterality: N/A;   CYSTOSCOPY/URETEROSCOPY/HOLMIUM LASER/STENT PLACEMENT Right 01/02/2018   Procedure: CYSTOSCOPY/URETEROSCOPY/HOLMIUM LASER/STENT PLACEMENT;  Surgeon: Sondra Come, MD;  Location: [facility] ORS;  Service: Urology;  Laterality: Right;   ESOPHAGOGASTRODUODENOSCOPY (EGD) WITH PROPOFOL N/A 11/19/2016   Procedure: ESOPHAGOGASTRODUODENOSCOPY (EGD) WITH PROPOFOL;  Surgeon: Midge Minium, MD;  Location: [facility] ENDOSCOPY;  Service: Endoscopy;  Laterality: N/A;   ESOPHAGOGASTRODUODENOSCOPY (EGD) WITH PROPOFOL N/A 02/02/2019   Procedure: ESOPHAGOGASTRODUODENOSCOPY (EGD) WITH PROPOFOL;  Surgeon: Midge Minium, MD;  Location: [facility] ENDOSCOPY;  Service: Endoscopy;  Laterality: N/A;   ICD IMPLANT N/A 02/02/2020   Procedure: ICD IMPLANT;  Surgeon: Lanier Prude, MD;  Location[facility] INVASIVE CV LAB;  Service: Cardiovascular;  Laterality: N/A;   LEAD REVISION/REPAIR N/A 02/16/2020   Procedure: LEAD REVISION/REPAIR;  Surgeon: Regan Lemming, MD;  Location: [facility] INVASIVE CV LAB;  Service: Cardiovascular;  Laterality: N/A;   LEFT HEART CATH N/A 07/08/2019   Procedure: Left Heart Cath;  Surgeon: Corky Crafts, MD;  Location: [facility] INVASIVE CV LAB;  Service: Cardiovascular;  Laterality: N/A;   LEFT HEART CATH AND CORONARY ANGIOGRAPHY N/A 07/07/2019   Procedure: LEFT HEART CATH AND CORONARY ANGIOGRAPHY;  Surgeon: Yvonne Kendall, MD;  Location: [facility]  INVASIVE CV LAB;  Service: Cardiovascular;  Laterality: N/A;   LITHOTRIPSY  2015   MELANOMA EXCISION  1980   malignant   NERVE SURGERY  2015   ulna nerve   TONSILLECTOMY  1945   WISDOM TOOTH EXTRACTION      Allergies: Allergies as of 08/12/2022 - Review Complete 08/12/2022  Allergen Reaction Noted   Metformin and related Diarrhea and Other (See Comments) 07/13/2018   Azithromycin Other (See Comments) 07/07/2015   Other Other (See Comments) 07/07/2019   Percocet [oxycodone-acetaminophen] Other (See Comments) 07/07/2015   Jardiance [empagliflozin] Other (See Comments) 08/03/2019    Medications: Outpatient Encounter Medications as of 08/12/2022  Medication Sig   amiodarone (PACERONE) 200 MG tablet Take 1 tablet (200 mg total) by mouth daily.   aspirin EC 81 MG tablet Take 1 tablet (81 mg total) by mouth daily. Swallow whole.   bisacodyl (DULCOLAX) 5 MG EC tablet Take 5 mg by mouth daily as needed for moderate constipation.   Blood Glucose Monitoring Suppl DEVI 1 each by Does not apply route in the morning and at bedtime.  May substitute to any manufacturer covered by patient's insurance.   carvedilol (COREG) 3.125 MG tablet TAKE 1 TABLET(3.125 MG) BY MOUTH TWICE DAILY   clopidogrel (PLAVIX) 75 MG tablet TAKE 1 TABLET(75 MG) BY MOUTH DAILY   cyclobenzaprine (FLEXERIL) 5 MG tablet Take 1 tablet (5 mg total) by mouth 3 (three) times daily as needed for muscle spasms. This can make you sleepy. (Patient taking differently: Take 5 mg by mouth daily as needed for muscle spasms. This can make you sleepy.)   dextromethorphan-guaiFENesin (MUCINEX DM) 30-600 MG 12hr tablet Take 1 tablet by mouth 2 (two) times daily.   docusate sodium (COLACE) 100 MG capsule Take 100 mg by mouth in the morning, at noon, and at bedtime.   ezetimibe (ZETIA) 10 MG tablet TAKE 1 TABLET(10 MG) BY MOUTH DAILY   famotidine (PEPCID) 20 MG tablet Take 20 mg by mouth daily with lunch.   fluticasone (FLONASE) 50 MCG/ACT nasal  spray Place into both nostrils daily as needed for allergies or rhinitis.   gabapentin (NEURONTIN) 100 MG capsule TAKE 1 CAPSULE(100 MG) BY MOUTH THREE TIMES DAILY (Patient taking differently: Take 100 mg by mouth daily as needed.)   glipiZIDE (GLUCOTROL) 10 MG tablet TAKE 1/2 TABLET BY MOUTH EVERY MORNING WITH BREAKFAST AND 1 TABLET BY MOUTH EVERY EVENING WITH SUPPER   lisinopril (ZESTRIL) 10 MG tablet TAKE 1 TABLET BY MOUTH DAILY   midodrine (PROAMATINE) 5 MG tablet Take 1 tablet (5 mg total) by mouth 2 (two) times daily as needed. Take when BP does drop less than 110 when standing. Additional dose at 2 pm if still dropping below 110 when standing   Multiple Vitamin (MULTIVITAMIN) tablet Take 1 tablet by mouth daily with lunch.   Omega-3 Fatty Acids (FISH OIL) 1000 MG CAPS Take 1,000 mg by mouth daily with lunch.   Omega-3 Fatty Acids (FISH OIL) 500 MG CAPS Take 500 mg by mouth every other day.   ONETOUCH VERIO test strip USE TO CHECK BLOOD SUGAR TWICE DAILY AS DIRECTED   pantoprazole (PROTONIX) 40 MG tablet Take 1 tablet (40 mg total) by mouth daily.   psyllium (METAMUCIL) 58.6 % powder Take 1 packet by mouth at bedtime.   rosuvastatin (CRESTOR) 10 MG tablet Take 1 tablet (10 mg total) by mouth every other day.   Semaglutide, 1 MG/DOSE, 4 MG/3ML SOPN Inject 1 mg as directed once a week.   sodium chloride (OCEAN) 0.65 % SOLN nasal spray Place 1 spray into both nostrils as needed for congestion.   spironolactone (ALDACTONE) 25 MG tablet TAKE 1 TABLET(25 MG) BY MOUTH DAILY   tamsulosin (FLOMAX) 0.4 MG CAPS capsule TAKE ONE CAPSULE BY MOUTH DAILY   traMADol (ULTRAM) 50 MG tablet TAKE 1 TABLET BY MOUTH EVERY 12 HOURS AS NEEDED   No facility-administered encounter medications on file as of 08/12/2022.    Social History: Social History   Tobacco Use   Smoking status: Some Days    Types: Pipe    Passive exposure: Current   Smokeless tobacco: Former    Quit date: 10/17/2015  Vaping Use    Vaping status: Never Used  Substance Use Topics   Alcohol use: Yes    Alcohol/week: 0.0 - 1.0 standard drinks of alcohol    Comment: OCCASIONALLY   Drug use: No    Family Medical History: Family History  Problem Relation Age of Onset   Hyperlipidemia Mother    Hypertension Mother    Heart disease Mother    Diabetes Mother  Heart attack Mother    Colon cancer Father        metasized to liver, adrenal, lungs   Lung cancer Father    Kidney cancer Father        malignant capsulated kidney tumor   Diabetes Father    Liver cancer Father    Bladder Cancer Neg Hx    Prostate cancer Neg Hx     Physical Examination: Vitals:   08/12/22 0929  BP: 126/70      Awake, alert, oriented to person, place, and time.  Speech is clear and fluent. Fund of knowledge is appropriate.   Cranial Nerves: Pupils equal round and reactive to light.  Facial tone is symmetric.   No abnormal lesions on exposed skin.   Strength:  Side Iliopsoas Quads Hamstring PF DF EHL  R 5 5 5 5 5 5   L 5 5 5 5 5 5     Bilateral lower extremity sensation is intact to light touch.     Gait is normal.   Medical Decision Making  Imaging: none  Assessment and Plan: [name] is a pleasant 82 y.o. male that has seen improvement with PT! He still has some intermittent LBP, but he is walking much better. Not having any leg pain.   He has known slip at L4-L5 mild central stenosis and moderate bilateral foraminal stenosis. Also with bilateral foraminal stenosis at L3-L4 as well. Primary pain generator is likely L4-L5.   Treatment options discussed with patient and following plan made:   - Continue HEP from PT.  - He will stop flexeril.  - Continue on neurontin 100mg  q hs from his PCP.  - Medications limited as he is on PLAVIX. - Current pain is tolerable. He will f/u prn. Can revisit PT and/opr injections if he gets worse.   I spent a total of 15 minutes in face-to-face and non-face-to-face activities  related to this patient's care today including review of outside records, review of imaging, review of symptoms, physical exam, discussion of differential diagnosis, discussion of treatment options, and documentation.   Drake Leach PA-C Dept. of Neurosurgery

## 2022-08-12 ENCOUNTER — Encounter: Payer: Self-pay | Admitting: Orthopedic Surgery

## 2022-08-12 ENCOUNTER — Ambulatory Visit: Payer: Medicare Other | Admitting: Orthopedic Surgery

## 2022-08-12 ENCOUNTER — Other Ambulatory Visit: Payer: Self-pay | Admitting: Cardiology

## 2022-08-12 ENCOUNTER — Other Ambulatory Visit: Payer: Self-pay | Admitting: Family

## 2022-08-12 VITALS — BP 126/70 | Wt 210.0 lb

## 2022-08-12 DIAGNOSIS — I25118 Atherosclerotic heart disease of native coronary artery with other forms of angina pectoris: Secondary | ICD-10-CM

## 2022-08-12 DIAGNOSIS — I10 Essential (primary) hypertension: Secondary | ICD-10-CM

## 2022-08-12 DIAGNOSIS — M47816 Spondylosis without myelopathy or radiculopathy, lumbar region: Secondary | ICD-10-CM

## 2022-08-12 DIAGNOSIS — M48061 Spinal stenosis, lumbar region without neurogenic claudication: Secondary | ICD-10-CM | POA: Diagnosis not present

## 2022-08-12 DIAGNOSIS — M4316 Spondylolisthesis, lumbar region: Secondary | ICD-10-CM

## 2022-08-16 ENCOUNTER — Telehealth: Payer: Self-pay

## 2022-08-16 NOTE — Telephone Encounter (Signed)
Patient has picked up medication 

## 2022-08-16 NOTE — Telephone Encounter (Signed)
Phone call to pt to let him know that his Ozempic was delivered and ready to be picked up.  Ozempic 4mg /19ml x 4 boxes. Pt reported that he would pick up medication this afternoon.

## 2022-08-27 ENCOUNTER — Ambulatory Visit (INDEPENDENT_AMBULATORY_CARE_PROVIDER_SITE_OTHER): Payer: Medicare Other

## 2022-08-27 DIAGNOSIS — I255 Ischemic cardiomyopathy: Secondary | ICD-10-CM

## 2022-08-27 LAB — CUP PACEART REMOTE DEVICE CHECK
Battery Remaining Longevity: 126 mo
Battery Remaining Percentage: 100 %
Brady Statistic RA Percent Paced: 10 %
Brady Statistic RV Percent Paced: 100 %
Date Time Interrogation Session: 20240813002100
HighPow Impedance: 76 Ohm
Implantable Lead Connection Status: 753985
Implantable Lead Connection Status: 753985
Implantable Lead Connection Status: 753985
Implantable Lead Implant Date: 20220202
Implantable Lead Implant Date: 20230213
Implantable Lead Implant Date: 20230213
Implantable Lead Location: 753858
Implantable Lead Location: 753859
Implantable Lead Location: 753860
Implantable Lead Model: 273
Implantable Lead Model: 4671
Implantable Lead Model: 7841
Implantable Lead Serial Number: 111255
Implantable Lead Serial Number: 1233258
Implantable Lead Serial Number: 861310
Implantable Pulse Generator Implant Date: 20230213
Lead Channel Impedance Value: 380 Ohm
Lead Channel Impedance Value: 536 Ohm
Lead Channel Impedance Value: 575 Ohm
Lead Channel Pacing Threshold Amplitude: 1 V
Lead Channel Pacing Threshold Pulse Width: 0.4 ms
Lead Channel Setting Pacing Amplitude: 1.8 V
Lead Channel Setting Pacing Amplitude: 2 V
Lead Channel Setting Pacing Amplitude: 2.2 V
Lead Channel Setting Pacing Pulse Width: 0.4 ms
Lead Channel Setting Pacing Pulse Width: 0.4 ms
Lead Channel Setting Sensing Sensitivity: 0.6 mV
Lead Channel Setting Sensing Sensitivity: 1 mV
Pulse Gen Serial Number: 279753

## 2022-09-02 ENCOUNTER — Other Ambulatory Visit: Payer: Self-pay | Admitting: Cardiovascular Disease

## 2022-09-04 ENCOUNTER — Ambulatory Visit: Payer: Medicare Other | Admitting: Urology

## 2022-09-04 ENCOUNTER — Encounter: Payer: Self-pay | Admitting: Urology

## 2022-09-04 VITALS — BP 105/65 | HR 70 | Ht 73.0 in | Wt 210.6 lb

## 2022-09-04 DIAGNOSIS — N281 Cyst of kidney, acquired: Secondary | ICD-10-CM | POA: Diagnosis not present

## 2022-09-04 DIAGNOSIS — N401 Enlarged prostate with lower urinary tract symptoms: Secondary | ICD-10-CM

## 2022-09-04 DIAGNOSIS — N200 Calculus of kidney: Secondary | ICD-10-CM

## 2022-09-04 LAB — BLADDER SCAN AMB NON-IMAGING

## 2022-09-04 MED ORDER — TAMSULOSIN HCL 0.4 MG PO CAPS: ORAL_CAPSULE | ORAL | 3 refills | Status: DC

## 2022-09-04 NOTE — Patient Instructions (Signed)

## 2022-09-04 NOTE — Progress Notes (Signed)
   09/04/2022 12:51 PM   [name] [name] [birth year] [id number]  Reason for visit: Follow up nephrolithiasis, BPH, renal cysts, adrenal nodule  HPI: He has a long history of BPH and is on Flomax.  His urinary symptoms are stable, and he occasionally feels like he has to urinate a second time immediately after his first void to empty completely.  He is minimally bothered by this.  He denies any gross hematuria or UTIs.  PVRs have been borderline in the past at 175 mL.  Bladder scan today but he denies urge to void.  Primary issue is nocturia 2-3 times overnight, but he is drinking a large glass of water with Metamucil before bed.  Continue Flomax, we discussed outlet procedures briefly, and he is not interested at this time.   In terms of his renal cysts, we had been following a ~1.5 centimeters right Bosniak 45F cyst that had been stable for many years on MRI.  I personally viewed and interpreted the most recent CT dated 06/12/2022 that shows stable renal cysts.  The right-sided adrenal adenoma was stable at 1.3 cm and was consistent with a benign adenoma.  There is no hydronephrosis. I previously had offered endocrine evaluation but he deferred.  He would like to continue yearly ultrasound surveillance for his stable renal cysts previously evaluated with MRI and CT.  Regarding nephrolithiasis, he underwent ureteroscopy with me in December 2019 on the right side.  He denies any stones over the last year.  On CT he has significant bilateral nonobstructive renal stone burden, but no hydronephrosis.  We discussed options including surveillance or ureteroscopy, and he would like to continue with surveillance.  -Flomax refilled -RTC 1 year PVR, symptom check.  He would like to continue yearly renal ultrasounds regarding his renal cysts   Sondra Come, MD  [facility] [address] [place], [place] [address] [phone]

## 2022-09-05 ENCOUNTER — Encounter: Payer: Self-pay | Admitting: Podiatry

## 2022-09-05 ENCOUNTER — Ambulatory Visit (INDEPENDENT_AMBULATORY_CARE_PROVIDER_SITE_OTHER): Payer: Medicare Other | Admitting: Podiatry

## 2022-09-05 DIAGNOSIS — B351 Tinea unguium: Secondary | ICD-10-CM

## 2022-09-05 DIAGNOSIS — E119 Type 2 diabetes mellitus without complications: Secondary | ICD-10-CM

## 2022-09-05 DIAGNOSIS — E1142 Type 2 diabetes mellitus with diabetic polyneuropathy: Secondary | ICD-10-CM

## 2022-09-05 DIAGNOSIS — M79674 Pain in right toe(s): Secondary | ICD-10-CM | POA: Diagnosis not present

## 2022-09-05 DIAGNOSIS — M79675 Pain in left toe(s): Secondary | ICD-10-CM

## 2022-09-05 DIAGNOSIS — M2042 Other hammer toe(s) (acquired), left foot: Secondary | ICD-10-CM | POA: Diagnosis not present

## 2022-09-05 DIAGNOSIS — M2041 Other hammer toe(s) (acquired), right foot: Secondary | ICD-10-CM | POA: Diagnosis not present

## 2022-09-05 NOTE — Progress Notes (Signed)
ANNUAL DIABETIC FOOT EXAM  Subjective: [name] [name] presents today annual diabetic foot exam.  Chief Complaint  Patient presents with   Nail Problem    A1C:6.8-DFC,Referring Provider Allegra Grana, [name],LOV:06/24,BS:123      Patient confirms h/o diabetes.  Patient denies any h/o foot wounds.  Patient has been diagnosed with neuropathy.  Risk factors: diabetes, diabetic neuropathy, h/o MI, HTN, CAD, CHF, hyperlipidemia, current tobacco user.  Allegra Grana, [name] is patient's PCP.  Past Medical History:  Diagnosis Date   Aortic insufficiency    a. noted on TTE 2015; b. 06/2019 Echo: AI not visualized.   Arthritis    CAD (coronary artery disease)    a. remote PCI in 1991 and 2005; b. MV 3/15: old inferior MI, no ischemia, LVEF 50%, slight inferior wall hypokniesis; c. 06/2019 NSTEMI/PCI: LM nl, LAD 80p/m (Atherectomy & 4.5x18 Resolute Onyx DES), 78m/d, D1 75 (PTCA), RI patent stent, LCX nl, RCA 100p, RPAV fills via L->R collats from LCX.   Chicken pox    Colon polyps    4 pre-cancerous    Diverticulitis    DM type 2 (diabetes mellitus, type 2) (HCC)    Family history of adverse reaction to anesthesia    GERD (gastroesophageal reflux disease)    Heart murmur    History of kidney stones    HOH (hard of hearing)    Hyperlipidemia    Hypertension    Ischemic cardiomyopathy    a. TTE 2015: EF  50-55%, mild global HK; b. 06/2019 Echo: EF 45-50%, Gr1 DD, basal inf AK. Triv MR.   Kidney stones    Melanoma (HCC) 1980   Resected from his back   Mitral regurgitation    a. noted on TTE 2015   Myocardial infarction [facility])    Patient Active Problem List   Diagnosis Date Noted   Right leg swelling 07/03/2022   Myalgia 02/11/2022   Bronchitis 11/28/2021   Constipation 08/27/2021   Elevated liver enzymes 03/12/2021   Diabetic neuropathy (HCC) 02/15/2021   Lung nodule 07/31/2020   Elevated bilirubin 07/10/2020   Fatigue 03/31/2020   Malfunction of implantable  defibrillator ventricular (ICD) lead 02/15/2020   Cardiomyopathy (HCC)    Chronic systolic heart failure (HCC) 02/02/2020   Mural thrombus of left ventricle 02/02/2020   Cardiac resynchronization therapy defibrillator (CRT-D) in place 02/02/2020   Ischemic cardiomyopathy    VT (ventricular tachycardia) (HCC) 07/09/2019   NSTEMI (non-ST elevated myocardial infarction) (HCC) 07/07/2019   De Quervain's tenosynovitis, left 05/17/2019   Esophagitis, unspecified without bleeding    Esophageal ulcer 11/20/2018   Acute left flank pain 10/23/2018   Post-nasal drip 10/23/2018   Allergic rhinitis 10/23/2018   Right shoulder pain 07/03/2018   Skin lesion 06/12/2018   Pruritus 06/12/2018   Kidney stones, calcium oxalate 05/28/2018   Atherosclerosis of native coronary artery with stable angina pectoris (HCC) 05/17/2018   GERD (gastroesophageal reflux disease) 02/23/2018   SCC (squamous cell carcinoma) 05/15/2017   Cervical stenosis of spine 05/15/2017   Tobacco abuse 01/26/2017   Cardiac murmur 01/08/2017   Right renal mass 10/31/2016   Abdominal pain 10/31/2016   Fatty liver 10/31/2016   Hematuria 10/31/2016   Acute non-recurrent maxillary sinusitis 10/17/2015   Carotid artery disease (HCC) 10/03/2015   Neck pain 07/28/2015   Atherosclerosis of abdominal aorta (HCC) 07/28/2015   CAD (coronary artery disease) 07/10/2015   Essential hypertension 07/10/2015   Mixed hyperlipidemia 07/10/2015   Controlled type 2 diabetes mellitus with complication, without  long-term current use of insulin (HCC) 07/10/2015   History of melanoma 07/10/2015   Chronic low back pain 07/10/2015   BPH (benign prostatic hyperplasia) 07/10/2015   Bilateral hand numbness 07/10/2015   Insomnia 07/10/2015   Past Surgical History:  Procedure Laterality Date   BIV UPGRADE N/A 02/26/2021   Procedure: BIV ICD UPGRADE;  Surgeon: Lanier Prude, MD;  Location: [facility] INVASIVE CV LAB;  Service: Cardiovascular;  Laterality:  N/A;   CHOLECYSTECTOMY  2012   COLONOSCOPY     in 2003 with polyp removed and leak anastomosis had to have open abdominal surgery    CORONARY ANGIOPLASTY WITH STENT PLACEMENT  1991 & 2005   CORONARY ATHERECTOMY N/A 07/08/2019   Procedure: CORONARY ATHERECTOMY;  Surgeon: Corky Crafts, MD;  Location: [facility] INVASIVE CV LAB;  Service: Cardiovascular;  Laterality: N/A;   CORONARY BALLOON ANGIOPLASTY N/A 07/08/2019   Procedure: CORONARY BALLOON ANGIOPLASTY;  Surgeon: Corky Crafts, MD;  Location: [facility] INVASIVE CV LAB;  Service: Cardiovascular;  Laterality: N/A;  diagonal    CORONARY PRESSURE/FFR STUDY N/A 07/07/2019   Procedure: INTRAVASCULAR PRESSURE WIRE/FFR STUDY;  Surgeon: Yvonne Kendall, MD;  Location: [facility] INVASIVE CV LAB;  Service: Cardiovascular;  Laterality: N/A;   CORONARY STENT INTERVENTION N/A 07/08/2019   Procedure: CORONARY STENT INTERVENTION;  Surgeon: Corky Crafts, MD;  Location: [facility] INVASIVE CV LAB;  Service: Cardiovascular;  Laterality: N/A;  lad   CORONARY ULTRASOUND/IVUS N/A 07/08/2019   Procedure: Intravascular Ultrasound/IVUS;  Surgeon: Corky Crafts, MD;  Location: [facility] INVASIVE CV LAB;  Service: Cardiovascular;  Laterality: N/A;   CYSTOSCOPY/URETEROSCOPY/HOLMIUM LASER/STENT PLACEMENT Right 01/02/2018   Procedure: CYSTOSCOPY/URETEROSCOPY/HOLMIUM LASER/STENT PLACEMENT;  Surgeon: Sondra Come, MD;  Location: [facility] ORS;  Service: Urology;  Laterality: Right;   ESOPHAGOGASTRODUODENOSCOPY (EGD) WITH PROPOFOL N/A 11/19/2016   Procedure: ESOPHAGOGASTRODUODENOSCOPY (EGD) WITH PROPOFOL;  Surgeon: Midge Minium, MD;  Location: [facility] ENDOSCOPY;  Service: Endoscopy;  Laterality: N/A;   ESOPHAGOGASTRODUODENOSCOPY (EGD) WITH PROPOFOL N/A 02/02/2019   Procedure: ESOPHAGOGASTRODUODENOSCOPY (EGD) WITH PROPOFOL;  Surgeon: Midge Minium, MD;  Location: [facility] ENDOSCOPY;  Service: Endoscopy;  Laterality: N/A;   ICD IMPLANT N/A 02/02/2020   Procedure: ICD IMPLANT;  Surgeon: Lanier Prude, MD;  Location: [facility] INVASIVE CV LAB;  Service: Cardiovascular;  Laterality: N/A;   LEAD REVISION/REPAIR N/A 02/16/2020   Procedure: LEAD REVISION/REPAIR;  Surgeon: Regan Lemming, MD;  Location: [facility] INVASIVE CV LAB;  Service: Cardiovascular;  Laterality: N/A;   LEFT HEART CATH N/A 07/08/2019   Procedure: Left Heart Cath;  Surgeon: Corky Crafts, MD;  Location: [facility] INVASIVE CV LAB;  Service: Cardiovascular;  Laterality: N/A;   LEFT HEART CATH AND CORONARY ANGIOGRAPHY N/A 07/07/2019   Procedure: LEFT HEART CATH AND CORONARY ANGIOGRAPHY;  Surgeon: Yvonne Kendall, MD;  Location: [facility] INVASIVE CV LAB;  Service: Cardiovascular;  Laterality: N/A;   LITHOTRIPSY  2015   MELANOMA EXCISION  1980   malignant   NERVE SURGERY  2015   ulna nerve   TONSILLECTOMY  1945   WISDOM TOOTH EXTRACTION     Current Outpatient Medications on File Prior to Visit  Medication Sig Dispense Refill   amiodarone (PACERONE) 200 MG tablet Take 1 tablet (200 mg total) by mouth daily. 90 tablet 3   aspirin EC 81 MG tablet Take 1 tablet (81 mg total) by mouth daily. Swallow whole. 90 tablet 3   bisacodyl (DULCOLAX) 5 MG EC tablet Take 5 mg by mouth daily as needed for moderate constipation.  Blood Glucose Monitoring Suppl (ONETOUCH VERIO FLEX SYSTEM) w/Device KIT USE TO CHECK BLOOD SUGAR EVERY MORNING AND EVERY NIGHT AT BEDTIME AS DIRECTED.     Blood Glucose Monitoring Suppl DEVI 1 each by Does not apply route in the morning and at bedtime. May substitute to any manufacturer covered by patient's insurance. 1 each 0   carvedilol (COREG) 3.125 MG tablet TAKE 1 TABLET(3.125 MG) BY MOUTH TWICE DAILY 180 tablet 1   clopidogrel (PLAVIX) 75 MG tablet TAKE 1 TABLET(75 MG) BY MOUTH DAILY 30 tablet 8   dextromethorphan-guaiFENesin (MUCINEX DM) 30-600 MG 12hr tablet Take 1 tablet by mouth 2 (two) times daily.     docusate sodium (COLACE) 100 MG capsule Take 100 mg by mouth in the morning, at noon, and at bedtime.      ezetimibe (ZETIA) 10 MG tablet TAKE 1 TABLET(10 MG) BY MOUTH DAILY 90 tablet 2   famotidine (PEPCID) 20 MG tablet Take 20 mg by mouth daily with lunch.     fluticasone (FLONASE) 50 MCG/ACT nasal spray Place into both nostrils daily as needed for allergies or rhinitis.     gabapentin (NEURONTIN) 100 MG capsule TAKE 1 CAPSULE(100 MG) BY MOUTH THREE TIMES DAILY (Patient taking differently: Take 100 mg by mouth as needed.) 90 capsule 3   glipiZIDE (GLUCOTROL) 10 MG tablet TAKE 1/2 TABLET BY MOUTH EVERY MORNING WITH BREAKFAST AND 1 TABLET BY MOUTH EVERY EVENING WITH SUPPER 120 tablet 3   lisinopril (ZESTRIL) 10 MG tablet TAKE 1 TABLET BY MOUTH DAILY 90 tablet 1   midodrine (PROAMATINE) 5 MG tablet Take 1 tablet (5 mg total) by mouth 2 (two) times daily as needed. Take when BP does drop less than 110 when standing. Additional dose at 2 pm if still dropping below 110 when standing 90 tablet 3   Multiple Vitamin (MULTIVITAMIN) tablet Take 1 tablet by mouth daily with lunch.     Omega-3 Fatty Acids (FISH OIL) 1000 MG CAPS Take 1,000 mg by mouth daily with lunch.     Omega-3 Fatty Acids (FISH OIL) 500 MG CAPS Take 500 mg by mouth every other day.     ONETOUCH VERIO test strip USE TO CHECK BLOOD SUGAR TWICE DAILY AS DIRECTED 100 strip 1   pantoprazole (PROTONIX) 40 MG tablet Take 1 tablet (40 mg total) by mouth daily. 90 tablet 3   psyllium (METAMUCIL) 58.6 % powder Take 1 packet by mouth at bedtime.     rosuvastatin (CRESTOR) 10 MG tablet Take 1 tablet (10 mg total) by mouth daily. 90 tablet 3   Semaglutide, 1 MG/DOSE, 4 MG/3ML SOPN Inject 1 mg as directed once a week. 3 mL 3   sodium chloride (OCEAN) 0.65 % SOLN nasal spray Place 1 spray into both nostrils as needed for congestion.     spironolactone (ALDACTONE) 25 MG tablet TAKE 1 TABLET(25 MG) BY MOUTH DAILY 90 tablet 0   tamsulosin (FLOMAX) 0.4 MG CAPS capsule TAKE ONE CAPSULE BY MOUTH DAILY 90 capsule 3   traMADol (ULTRAM) 50 MG tablet TAKE 1 TABLET BY  MOUTH EVERY 12 HOURS AS NEEDED 60 tablet 2   No current facility-administered medications on file prior to visit.    Allergies  Allergen Reactions   Metformin And Related Diarrhea and Other (See Comments)    Leg cramps, also   Azithromycin Other (See Comments)    Not recommended - no reaction   Other Other (See Comments)    If taking antibiotics for awhile, thrush results  Percocet [Oxycodone-Acetaminophen] Other (See Comments)    Hallucinations   Jardiance [Empagliflozin] Other (See Comments)    Tongue itching/reaction   Social History   Occupational History   Not on file  Tobacco Use   Smoking status: Some Days    Types: Pipe    Passive exposure: Current   Smokeless tobacco: Former    Quit date: 10/17/2015  Vaping Use   Vaping status: Never Used  Substance and Sexual Activity   Alcohol use: Yes    Alcohol/week: 0.0 - 1.0 standard drinks of alcohol    Comment: OCCASIONALLY   Drug use: No   Sexual activity: Not on file   Family History  Problem Relation Age of Onset   Hyperlipidemia Mother    Hypertension Mother    Heart disease Mother    Diabetes Mother    Heart attack Mother    Colon cancer Father        metasized to liver, adrenal, lungs   Lung cancer Father    Kidney cancer Father        malignant capsulated kidney tumor   Diabetes Father    Liver cancer Father    Bladder Cancer Neg Hx    Prostate cancer Neg Hx    Immunization History  Administered Date(s) Administered   Fluad Quad(high Dose 65+) 10/13/2020, 09/28/2021   Influenza, High Dose Seasonal PF 11/01/2015, 10/20/2017, 09/28/2019   Influenza,inj,Quad PF,6+ Mos 10/08/2016   Influenza-Unspecified 10/01/2018   Moderna Sars-Covid-2 Vaccination 05/08/2020   PFIZER(Purple Top)SARS-COV-2 Vaccination 02/04/2019, 02/25/2019, 10/12/2019   PNEUMOCOCCAL CONJUGATE-20 02/07/2021   Pfizer Covid-19 Vaccine Bivalent Booster 55yrs & up 11/04/2020     Review of Systems: Negative except as noted in the HPI.    Objective: There were no vitals filed for this visit.  Sourya Isip is a pleasant 82 y.o. male in NAD. AAO X 3.  Vascular Examination: Capillary refill time immediate b/l. Vascular status intact b/l with palpable pedal pulses. Pedal hair absent b/l. No pain with calf compression b/l. Skin temperature gradient WNL b/l. No cyanosis or clubbing b/l. No ischemia or gangrene noted b/l.   Neurological Examination: Sensation grossly intact b/l with 10 gram monofilament. Vibratory sensation intact b/l.   Dermatological Examination: Pedal skin with normal turgor, texture and tone b/l.  No open wounds. No interdigital macerations.   Toenails 1-5 b/l thick, discolored, elongated with subungual debris and pain on dorsal palpation.   No corns, calluses nor porokeratotic lesions noted.  Musculoskeletal Examination: Normal muscle strength 5/5 to all lower extremity muscle groups bilaterally. Hammertoe deformity noted 2-5 b/l.[name] No pain, crepitus or joint limitation noted with ROM b/l LE.  Patient ambulates independently without assistive aids.  Radiographs: None  Last A1c:      Latest Ref Rng & Units 05/29/2022   10:08 AM 11/28/2021   11:29 AM  Hemoglobin A1C  Hemoglobin-A1c 4.0 - 5.6 % 6.8  6.5    Lab Results  Component Value Date   HGBA1C 6.8 (A) 05/29/2022   ADA Risk Categorization: Low Risk :  Patient has all of the following: Intact protective sensation No prior foot ulcer  No severe deformity Pedal pulses present  Assessment: 1. Pain due to onychomycosis of toenails of both feet   2. Acquired hammertoes of both feet   3. Diabetic polyneuropathy associated with type 2 diabetes mellitus (HCC)   4. Encounter for diabetic foot exam [facility])     Plan: -Patient was evaluated and treated. All patient's and/or POA's questions/concerns answered on today's  visit. -Diabetic foot examination performed today. -Continue diabetic foot care principles: inspect feet daily, monitor  glucose as recommended by PCP and/or Endocrinologist, and follow prescribed diet per PCP, Endocrinologist and/or dietician. -Patient to continue soft, supportive shoe gear daily. -Toenails 1-5 b/l were debrided in length and girth with sterile nail nippers and dremel without iatrogenic bleeding.  -Patient/POA to call should there be question/concern in the interim. No follow-ups on file.  Freddie Breech, DPM

## 2022-09-11 NOTE — Progress Notes (Signed)
Remote ICD transmission.   

## 2022-09-15 ENCOUNTER — Other Ambulatory Visit: Payer: Self-pay | Admitting: Cardiovascular Disease

## 2022-09-15 HISTORY — PX: OTHER SURGICAL HISTORY: SHX169

## 2022-09-28 ENCOUNTER — Other Ambulatory Visit: Payer: Self-pay | Admitting: Family

## 2022-09-28 DIAGNOSIS — E1165 Type 2 diabetes mellitus with hyperglycemia: Secondary | ICD-10-CM

## 2022-10-03 ENCOUNTER — Other Ambulatory Visit: Payer: Self-pay | Admitting: Family

## 2022-10-08 ENCOUNTER — Other Ambulatory Visit: Payer: Self-pay | Admitting: Family

## 2022-10-08 DIAGNOSIS — G8929 Other chronic pain: Secondary | ICD-10-CM

## 2022-10-09 ENCOUNTER — Ambulatory Visit: Payer: Medicare Other | Admitting: Dermatology

## 2022-10-09 ENCOUNTER — Encounter: Payer: Self-pay | Admitting: *Deleted

## 2022-10-09 VITALS — BP 111/61 | HR 63

## 2022-10-09 DIAGNOSIS — W908XXA Exposure to other nonionizing radiation, initial encounter: Secondary | ICD-10-CM

## 2022-10-09 DIAGNOSIS — L821 Other seborrheic keratosis: Secondary | ICD-10-CM | POA: Diagnosis not present

## 2022-10-09 DIAGNOSIS — Z872 Personal history of diseases of the skin and subcutaneous tissue: Secondary | ICD-10-CM

## 2022-10-09 DIAGNOSIS — L570 Actinic keratosis: Secondary | ICD-10-CM | POA: Diagnosis not present

## 2022-10-09 DIAGNOSIS — L578 Other skin changes due to chronic exposure to nonionizing radiation: Secondary | ICD-10-CM | POA: Diagnosis not present

## 2022-10-09 DIAGNOSIS — Z85820 Personal history of malignant melanoma of skin: Secondary | ICD-10-CM

## 2022-10-09 NOTE — Progress Notes (Signed)
   Follow-Up Visit   Subjective  Konan Hirota is a 82 y.o. male who presents for the following: rough, sensitive spots on the scalp. Areas improved some after using antibiotic ointment. H/o Aks, H/o melanoma in the 1980s  The patient has spots, moles and lesions to be evaluated, some may be new or changing.    The following portions of the chart were reviewed this encounter and updated as appropriate: medications, allergies, medical history  Review of Systems:  No other skin or systemic complaints except as noted in HPI or Assessment and Plan.  Objective  Well appearing patient in no apparent distress; mood and affect are within normal limits.  A focused examination was performed of the following areas: Face,scalp  Relevant physical exam findings are noted in the Assessment and Plan.  mid crown x 7, L sideburn x 1 (8) Keratotic papules.    Assessment & Plan   Hypertrophic actinic keratosis (8) mid crown x 7, L sideburn x 1  (vs Inflamed SK L sideburn)  Actinic keratoses are precancerous spots that appear secondary to cumulative UV radiation exposure/sun exposure over time. They are chronic with expected duration over 1 year. A portion of actinic keratoses will progress to squamous cell carcinoma of the skin. It is not possible to reliably predict which spots will progress to skin cancer and so treatment is recommended to prevent development of skin cancer.  Recommend daily broad spectrum sunscreen SPF 30+ to sun-exposed areas, reapply every 2 hours as needed.  Recommend staying in the shade or wearing long sleeves, sun glasses (UVA+UVB protection) and wide brim hats (4-inch brim around the entire circumference of the hat). Call for new or changing lesions.  Destruction of lesion - mid crown x 7, L sideburn x 1 (8)  Destruction method: cryotherapy   Informed consent: discussed and consent obtained   Lesion destroyed using liquid nitrogen: Yes   Region frozen until ice ball  extended beyond lesion: Yes   Outcome: patient tolerated procedure well with no complications   Post-procedure details: wound care instructions given   Additional details:  Prior to procedure, discussed risks of blister formation, small wound, skin dyspigmentation, or rare scar following cryotherapy. Recommend Vaseline ointment to treated areas while healing.   ACTINIC DAMAGE - chronic, secondary to cumulative UV radiation exposure/sun exposure over time - diffuse scaly erythematous macules with underlying dyspigmentation - Recommend daily broad spectrum sunscreen SPF 30+ to sun-exposed areas, reapply every 2 hours as needed.  - Recommend staying in the shade or wearing long sleeves, sun glasses (UVA+UVB protection) and wide brim hats (4-inch brim around the entire circumference of the hat). - Call for new or changing lesions.  SEBORRHEIC KERATOSIS - Stuck-on, waxy, tan-brown papules and/or plaques, including scalp  - Benign-appearing - Discussed benign etiology and prognosis. - Observe - Call for any changes   Return as scheduled with Dr Verdell Face, Cherlyn Labella, CMA, am acting as scribe for Willeen Niece, MD .   Documentation: I have reviewed the above documentation for accuracy and completeness, and I agree with the above.  Willeen Niece, MD

## 2022-10-09 NOTE — Patient Instructions (Addendum)

## 2022-10-18 ENCOUNTER — Telehealth: Payer: Self-pay | Admitting: Cardiovascular Disease

## 2022-10-18 NOTE — Telephone Encounter (Signed)
Spoke w/ pt.  He reports that he was sitting in his chair around noon and got up to get lunch when he felt dizzy and had to sit down on his way to the kitchen. He checked his BP and got the following readings:    12:25 pm  92/61  HR 61 12:30 pm  65/42  HR 58 12:45 pm  115/64  HR 54 1:10 pm  102/52  HR 50  Pt denies nausea, sweating, SOB or pain. He is currently taking carvedilol 3.125 mg BID and spironolactone 25 mg daily. He has rx for midodrine 5 mg that was filled in 2022, but he has not taken this in quite some time. He took his meds this am when he woke up. He has had 3 cups of coffee today, as well as aV8 & a caffeine free Diet Coke at lunchtime. For breakfast he had a sausage biscuit and 1/2 a banana. His BG was 112 this am. His usual BP is 120-30s/70s. He is feeling fine right now, asked him to recheck while on the phone w/ me and it is 115/69, P 51. Pt has been pushing fluids since episode. Pt feels this is an isolated event, but asked him to monitor his BP over the weekend and call back on Monday w/ readings. He is agreeable and appreciative of the call.

## 2022-10-18 NOTE — Telephone Encounter (Signed)
Pt c/o BP issue: STAT if pt c/o blurred vision, one-sided weakness or slurred speech  1. What are your last 5 BP readings?   12:25 pm  92/61  HR 61 12:30 pm  65/42  HR 58 12:45 pm  115/64  HR 54 1:10 pm  102/52  HR 50  2. Are you having any other symptoms (ex. Dizziness, headache, blurred vision, passed out)?   Dizzy when standing and had to sit down  3. What is your BP issue?    Patient stated he is concerned his BP readings have been low.

## 2022-10-19 ENCOUNTER — Other Ambulatory Visit: Payer: Self-pay | Admitting: Cardiovascular Disease

## 2022-10-19 ENCOUNTER — Other Ambulatory Visit: Payer: Self-pay | Admitting: Urology

## 2022-10-19 DIAGNOSIS — N200 Calculus of kidney: Secondary | ICD-10-CM

## 2022-10-21 ENCOUNTER — Ambulatory Visit (INDEPENDENT_AMBULATORY_CARE_PROVIDER_SITE_OTHER): Payer: Medicare Other | Admitting: *Deleted

## 2022-10-21 VITALS — BP 131/75 | HR 59 | Ht 73.0 in | Wt 200.0 lb

## 2022-10-21 DIAGNOSIS — Z0000 Encounter for general adult medical examination without abnormal findings: Secondary | ICD-10-CM | POA: Diagnosis not present

## 2022-10-21 NOTE — Telephone Encounter (Signed)
Patient called again to report his 3:00 pm reading was BP 97/57 HR 58.  Patient also noted he did take a 2.5 mg dose of midodrine (PROAMATINE) 5 MG tablet at 3:15 pm.

## 2022-10-21 NOTE — Telephone Encounter (Signed)
Pt states he is calling back in to give some BP readings.   BP -  Saturday Noon- 91/48  3:00pm - 112/60   Sunday 8:30am- 145/80 4:00pm -116/70   Today 7:30am-  131/75  Please advise

## 2022-10-21 NOTE — Patient Instructions (Addendum)
[name] [name] , Thank you for taking time to come for your Medicare Wellness Visit. I appreciate your ongoing commitment to your health goals. Please review the following plan we discussed and let me know if I can assist you in the future.   Referrals/Orders/Follow-Ups/Clinician Recommendations: Remember to update your Flu , Tetanus, Covid vaccines and consider the Shingles  This is a list of the screening recommended for you and due dates:  Health Maintenance  Topic Date Due   DTaP/Tdap/Td vaccine (1 - Tdap) Never done   Zoster (Shingles) Vaccine (1 of 2) Never done   Flu Shot  08/15/2022   COVID-19 Vaccine (6 - 2023-24 season) 09/15/2022   Eye exam for diabetics  10/30/2022   Hemoglobin A1C  11/29/2022   Yearly kidney function blood test for diabetes  02/13/2023   Yearly kidney health urinalysis for diabetes  06/19/2023   Complete foot exam   09/05/2023   Medicare Annual Wellness Visit  10/21/2023   Pneumonia Vaccine  Completed   HPV Vaccine  Aged Out   Hepatitis C Screening  Discontinued    Advanced directives: (In Chart) A copy of your advanced directives are scanned into your chart should your provider ever need it.  Next Medicare Annual Wellness Visit scheduled for next year: Yes 10/27/23@ 9:00   Managing Pain Without Opioids Opioids are strong medicines used to treat moderate to severe pain. For some people, especially those who have long-term (chronic) pain, opioids may not be the best choice for pain management due to: Side effects like nausea, constipation, and sleepiness. The risk of addiction (opioid use disorder). The longer you take opioids, the greater your risk of addiction. Pain that lasts for more than 3 months is called chronic pain. Managing chronic pain usually requires more than one approach and is often provided by a team of health care providers working together (multidisciplinary approach). Pain management may be done at a pain management center or pain  clinic. How to manage pain without the use of opioids Use non-opioid medicines Non-opioid medicines for pain may include: Over-the-counter or prescription non-steroidal anti-inflammatory drugs (NSAIDs). These may be the first medicines used for pain. They work well for muscle and bone pain, and they reduce swelling. Acetaminophen. This over-the-counter medicine may work well for milder pain but not swelling. Antidepressants. These may be used to treat chronic pain. A certain type of antidepressant (tricyclics) is often used. These medicines are given in lower doses for pain than when used for depression. Anticonvulsants. These are usually used to treat seizures but may also reduce nerve (neuropathic) pain. Muscle relaxants. These relieve pain caused by sudden muscle tightening (spasms). You may also use a pain medicine that is applied to the skin as a patch, cream, or gel (topical analgesic), such as a numbing medicine. These may cause fewer side effects than medicines taken by mouth. Do certain therapies as directed Some therapies can help with pain management. They include: Physical therapy. You will do exercises to gain strength and flexibility. A physical therapist may teach you exercises to move and stretch parts of your body that are weak, stiff, or painful. You can learn these exercises at physical therapy visits and practice them at home. Physical therapy may also involve: Massage. Heat wraps or applying heat or cold to affected areas. Electrical signals that interrupt pain signals (transcutaneous electrical nerve stimulation, TENS). Weak lasers that reduce pain and swelling (low-level laser therapy). Signals from your body that help you learn to regulate pain (biofeedback).  Occupational therapy. This helps you to learn ways to function at home and work with less pain. Recreational therapy. This involves trying new activities or hobbies, such as a physical activity or drawing. Mental  health therapy, including: Cognitive behavioral therapy (CBT). This helps you learn coping skills for dealing with pain. Acceptance and commitment therapy (ACT) to change the way you think and react to pain. Relaxation therapies, including muscle relaxation exercises and mindfulness-based stress reduction. Pain management counseling. This may be individual, family, or group counseling.  Receive medical treatments Medical treatments for pain management include: Nerve block injections. These may include a pain blocker and anti-inflammatory medicines. You may have injections: Near the spine to relieve chronic back or neck pain. Into joints to relieve back or joint pain. Into nerve areas that supply a painful area to relieve body pain. Into muscles (trigger point injections) to relieve some painful muscle conditions. A medical device placed near your spine to help block pain signals and relieve nerve pain or chronic back pain (spinal cord stimulation device). Acupuncture. Follow these instructions at home Medicines Take over-the-counter and prescription medicines only as told by your health care provider. If you are taking pain medicine, ask your health care providers about possible side effects to watch out for. Do not drive or use heavy machinery while taking prescription opioid pain medicine. Lifestyle  Do not use drugs or alcohol to reduce pain. If you drink alcohol, limit how much you have to: 0-1 drink a day for women who are not pregnant. 0-2 drinks a day for men. Know how much alcohol is in a drink. In the [place] one drink equals one 12 oz bottle of beer (355 mL), one 5 oz glass of wine (148 mL), or one 1 oz glass of hard liquor (44 mL). Do not use any products that contain nicotine or tobacco. These products include cigarettes, chewing tobacco, and vaping devices, such as e-cigarettes. If you need help quitting, ask your health care provider. Eat a healthy diet and maintain a healthy  weight. Poor diet and excess weight may make pain worse. Eat foods that are high in fiber. These include fresh fruits and vegetables, whole grains, and beans. Limit foods that are high in fat and processed sugars, such as fried and sweet foods. Exercise regularly. Exercise lowers stress and may help relieve pain. Ask your health care provider what activities and exercises are safe for you. If your health care provider approves, join an exercise class that combines movement and stress reduction. Examples include yoga and tai chi. Get enough sleep. Lack of sleep may make pain worse. Lower stress as much as possible. Practice stress reduction techniques as told by your therapist. General instructions Work with all your pain management providers to find the treatments that work best for you. You are an important member of your pain management team. There are many things you can do to reduce pain on your own. Consider joining an online or in-person support group for people who have chronic pain. Keep all follow-up visits. This is important. Where to find more information You can find more information about managing pain without opioids from: American Academy of Pain Medicine: painmed.org Institute for Chronic Pain: instituteforchronicpain.org American Chronic Pain Association: theacpa.org Contact a health care provider if: You have side effects from pain medicine. Your pain gets worse or does not get better with treatments or home therapy. You are struggling with anxiety or depression. Summary Many types of pain can be managed without opioids.  Chronic pain may respond better to pain management without opioids. Pain is best managed when you and a team of health care providers work together. Pain management without opioids may include non-opioid medicines, medical treatments, physical therapy, mental health therapy, and lifestyle changes. Tell your health care providers if your pain gets worse or  is not being managed well enough. This information is not intended to replace advice given to you by your health care provider. Make sure you discuss any questions you have with your health care provider. Document Revised: 04/12/2020 Document Reviewed: 04/12/2020 Elsevier Patient Education  2024 [employer].

## 2022-10-21 NOTE — Telephone Encounter (Signed)
Please contact pt for future appointment. Pt due for 12 month f/u. 

## 2022-10-21 NOTE — Telephone Encounter (Signed)
Patient returning call. Will route to MD to review updated BP readings as below. Thanks!

## 2022-10-21 NOTE — Progress Notes (Signed)
Subjective:   [name] [name] is a 82 y.o. male who presents for Medicare Annual/Subsequent preventive examination.  Visit Complete: Virtual  I connected with  [name] [name] on 10/21/22 by a audio enabled telemedicine application and verified that I am speaking with the correct person using two identifiers.  Patient Location: Home  Provider Location: Office/Clinic  I discussed the limitations of evaluation and management by telemedicine. The patient expressed understanding and agreed to proceed.  Patient Medicare AWV questionnaire was completed by the patient on 10/18/22; I have confirmed that all information answered by patient is correct and no changes since this date.  Because this visit was a virtual/telehealth visit, some criteria may be missing or patient reported. Any vitals not documented were not able to be obtained and vitals that have been documented are patient reported.     Cardiac Risk Factors include: advanced age (>62men, >56 women);diabetes mellitus;dyslipidemia;male gender;hypertension;Other (see comment), Risk factor comments: Pacemaker     Objective:    Today's Vitals   10/21/22 0859  BP: 131/75  Pulse: (!) 59  Weight: 200 lb (90.7 kg)  Height: 6\' 1"  (1.854 m)   Body mass index is 26.39 kg/m.     10/21/2022    9:19 AM 02/26/2021   12:13 PM 10/12/2020   10:08 AM 04/19/2020    5:13 PM 02/15/2020    2:16 PM 02/02/2020    7:23 AM 10/12/2019   10:08 AM  Advanced Directives  Does Patient Have a Medical Advance Directive? Yes Yes Yes Yes Yes Yes Yes  Type of Estate agent of [place];Living will Healthcare Power of [place];Living will Healthcare Power of [place];Living will Healthcare Power of [place];Living will Healthcare Power of [place] Living will Healthcare Power of [place];Living will  Does patient want to make changes to medical advance directive? No - Patient declined  No - Patient declined  No - Patient declined  No -  Patient declined  Copy of Healthcare Power of Attorney in Chart? Yes - validated most recent copy scanned in chart (See row information)  Yes - validated most recent copy scanned in chart (See row information)  No - copy requested  No - copy requested    Current Medications (verified) Outpatient Encounter Medications as of 10/21/2022  Medication Sig   amiodarone (PACERONE) 200 MG tablet Take 1 tablet (200 mg total) by mouth daily.   aspirin EC 81 MG tablet Take 1 tablet (81 mg total) by mouth daily. Swallow whole.   bisacodyl (DULCOLAX) 5 MG EC tablet Take 5 mg by mouth daily as needed for moderate constipation.   Blood Glucose Monitoring Suppl (ONETOUCH VERIO FLEX SYSTEM) w/Device KIT USE TO CHECK BLOOD SUGAR EVERY MORNING AND EVERY NIGHT AT BEDTIME AS DIRECTED.   Blood Glucose Monitoring Suppl DEVI 1 each by Does not apply route in the morning and at bedtime. May substitute to any manufacturer covered by patient's insurance.   carvedilol (COREG) 3.125 MG tablet TAKE 1 TABLET(3.125 MG) BY MOUTH TWICE DAILY   clopidogrel (PLAVIX) 75 MG tablet TAKE 1 TABLET(75 MG) BY MOUTH DAILY   dextromethorphan-guaiFENesin (MUCINEX DM) 30-600 MG 12hr tablet Take 1 tablet by mouth 2 (two) times daily.   docusate sodium (COLACE) 100 MG capsule Take 100 mg by mouth in the morning, at noon, and at bedtime.   ezetimibe (ZETIA) 10 MG tablet TAKE 1 TABLET(10 MG) BY MOUTH DAILY   famotidine (PEPCID) 20 MG tablet Take 20 mg by mouth daily with lunch.   fluticasone (FLONASE) 50  MCG/ACT nasal spray Place into both nostrils daily as needed for allergies or rhinitis.   gabapentin (NEURONTIN) 100 MG capsule TAKE 1 CAPSULE(100 MG) BY MOUTH THREE TIMES DAILY (Patient taking differently: Take 100 mg by mouth as needed.)   glipiZIDE (GLUCOTROL) 10 MG tablet TAKE 1/2 TABLET BY MOUTH EVERY MORNING WITH BREAKFAST AND 1 TABLET EVERY EVENING WITH SUPPER.   lisinopril (ZESTRIL) 10 MG tablet TAKE 1 TABLET BY MOUTH DAILY   midodrine  (PROAMATINE) 5 MG tablet Take 1 tablet (5 mg total) by mouth 2 (two) times daily as needed. Take when BP does drop less than 110 when standing. Additional dose at 2 pm if still dropping below 110 when standing   Multiple Vitamin (MULTIVITAMIN) tablet Take 1 tablet by mouth daily with lunch.   Omega-3 Fatty Acids (FISH OIL) 1000 MG CAPS Take 1,000 mg by mouth daily with lunch.   Omega-3 Fatty Acids (FISH OIL) 500 MG CAPS Take 500 mg by mouth every other day.   ONETOUCH VERIO test strip USE TO CHECK BLOOD SUGAR TWICE DAILY AS DIRECTED   pantoprazole (PROTONIX) 40 MG tablet Take 1 tablet (40 mg total) by mouth daily.   psyllium (METAMUCIL) 58.6 % powder Take 1 packet by mouth at bedtime.   rosuvastatin (CRESTOR) 10 MG tablet Take 1 tablet (10 mg total) by mouth daily.   Semaglutide, 1 MG/DOSE, 4 MG/3ML SOPN Inject 1 mg as directed once a week.   sodium chloride (OCEAN) 0.65 % SOLN nasal spray Place 1 spray into both nostrils as needed for congestion.   spironolactone (ALDACTONE) 25 MG tablet TAKE 1 TABLET(25 MG) BY MOUTH DAILY   tamsulosin (FLOMAX) 0.4 MG CAPS capsule TAKE ONE CAPSULE BY MOUTH DAILY   traMADol (ULTRAM) 50 MG tablet TAKE 1 TABLET BY MOUTH EVERY 12 HOURS AS NEEDED   No facility-administered encounter medications on file as of 10/21/2022.    Allergies (verified) Metformin and related, Azithromycin, Other, Percocet [oxycodone-acetaminophen], and Jardiance [empagliflozin]   History: Past Medical History:  Diagnosis Date   Aortic insufficiency    a. noted on TTE 2015; b. 06/2019 Echo: AI not visualized.   Arthritis    CAD (coronary artery disease)    a. remote PCI in 1991 and 2005; b. MV 3/15: old inferior MI, no ischemia, LVEF 50%, slight inferior wall hypokniesis; c. 06/2019 NSTEMI/PCI: LM nl, LAD 80p/m (Atherectomy & 4.5x18 Resolute Onyx DES), 65m/d, D1 75 (PTCA), RI patent stent, LCX nl, RCA 100p, RPAV fills via L->R collats from LCX.   Chicken pox    Colon polyps    4  pre-cancerous    Diverticulitis    DM type 2 (diabetes mellitus, type 2) (HCC)    Family history of adverse reaction to anesthesia    GERD (gastroesophageal reflux disease)    Heart murmur    History of kidney stones    HOH (hard of hearing)    Hyperlipidemia    Hypertension    Ischemic cardiomyopathy    a. TTE 2015: EF  50-55%, mild global HK; b. 06/2019 Echo: EF 45-50%, Gr1 DD, basal inf AK. Triv MR.   Kidney stones    Melanoma (HCC) 1980   Resected from his back   Mitral regurgitation    a. noted on TTE 2015   Myocardial infarction [facility])    Past Surgical History:  Procedure Laterality Date   BIV UPGRADE N/A 02/26/2021   Procedure: BIV ICD UPGRADE;  Surgeon: Lanier Prude, MD;  Location: [facility] INVASIVE CV LAB;  Service: Cardiovascular;  Laterality: N/A;   CHOLECYSTECTOMY  2012   COLONOSCOPY     in 2003 with polyp removed and leak anastomosis had to have open abdominal surgery    CORONARY ANGIOPLASTY WITH STENT PLACEMENT  1991 & 2005   CORONARY ATHERECTOMY N/A 07/08/2019   Procedure: CORONARY ATHERECTOMY;  Surgeon: Corky Crafts, MD;  Location: [facility] INVASIVE CV LAB;  Service: Cardiovascular;  Laterality: N/A;   CORONARY BALLOON ANGIOPLASTY N/A 07/08/2019   Procedure: CORONARY BALLOON ANGIOPLASTY;  Surgeon: Corky Crafts, MD;  Location: [facility] INVASIVE CV LAB;  Service: Cardiovascular;  Laterality: N/A;  diagonal    CORONARY PRESSURE/FFR STUDY N/A 07/07/2019   Procedure: INTRAVASCULAR PRESSURE WIRE/FFR STUDY;  Surgeon: Yvonne Kendall, MD;  Location: [facility] INVASIVE CV LAB;  Service: Cardiovascular;  Laterality: N/A;   CORONARY STENT INTERVENTION N/A 07/08/2019   Procedure: CORONARY STENT INTERVENTION;  Surgeon: Corky Crafts, MD;  Location: [facility] INVASIVE CV LAB;  Service: Cardiovascular;  Laterality: N/A;  lad   CORONARY ULTRASOUND/IVUS N/A 07/08/2019   Procedure: Intravascular Ultrasound/IVUS;  Surgeon: Corky Crafts, MD;  Location: [facility] INVASIVE CV LAB;   Service: Cardiovascular;  Laterality: N/A;   CYSTOSCOPY/URETEROSCOPY/HOLMIUM LASER/STENT PLACEMENT Right 01/02/2018   Procedure: CYSTOSCOPY/URETEROSCOPY/HOLMIUM LASER/STENT PLACEMENT;  Surgeon: Sondra Come, MD;  Location: [facility] ORS;  Service: Urology;  Laterality: Right;   ESOPHAGOGASTRODUODENOSCOPY (EGD) WITH PROPOFOL N/A 11/19/2016   Procedure: ESOPHAGOGASTRODUODENOSCOPY (EGD) WITH PROPOFOL;  Surgeon: Midge Minium, MD;  Location: [facility] ENDOSCOPY;  Service: Endoscopy;  Laterality: N/A;   ESOPHAGOGASTRODUODENOSCOPY (EGD) WITH PROPOFOL N/A 02/02/2019   Procedure: ESOPHAGOGASTRODUODENOSCOPY (EGD) WITH PROPOFOL;  Surgeon: Midge Minium, MD;  Location: [facility] ENDOSCOPY;  Service: Endoscopy;  Laterality: N/A;   ICD IMPLANT N/A 02/02/2020   Procedure: ICD IMPLANT;  Surgeon: Lanier Prude, MD;  Location: [facility] INVASIVE CV LAB;  Service: Cardiovascular;  Laterality: N/A;   LEAD REVISION/REPAIR N/A 02/16/2020   Procedure: LEAD REVISION/REPAIR;  Surgeon: Regan Lemming, MD;  Location: [facility] INVASIVE CV LAB;  Service: Cardiovascular;  Laterality: N/A;   LEFT HEART CATH N/A 07/08/2019   Procedure: Left Heart Cath;  Surgeon: Corky Crafts, MD;  Location: [facility] INVASIVE CV LAB;  Service: Cardiovascular;  Laterality: N/A;   LEFT HEART CATH AND CORONARY ANGIOGRAPHY N/A 07/07/2019   Procedure: LEFT HEART CATH AND CORONARY ANGIOGRAPHY;  Surgeon: Yvonne Kendall, MD;  Location: [facility] INVASIVE CV LAB;  Service: Cardiovascular;  Laterality: N/A;   LITHOTRIPSY  2015   MELANOMA EXCISION  1980   malignant   NERVE SURGERY  2015   ulna nerve   pre cancer lesion from scalp  09/2022   TONSILLECTOMY  1945   WISDOM TOOTH EXTRACTION     Family History  Problem Relation Age of Onset   Hyperlipidemia Mother    Hypertension Mother    Heart disease Mother    Diabetes Mother    Heart attack Mother    Colon cancer Father        metasized to liver, adrenal, lungs   Lung cancer Father    Kidney cancer Father         malignant capsulated kidney tumor   Diabetes Father    Liver cancer Father    Bladder Cancer Neg Hx    Prostate cancer Neg Hx    Social History   Socioeconomic History   Marital status: Married    Spouse name: Almyra Free   Number of children: Not on file   Years of education: Not on file   Highest education  level: Master's degree (e.g., MA, MS, MEng, MEd, MSW, MBA)  Occupational History   Not on file  Tobacco Use   Smoking status: Some Days    Types: Pipe    Passive exposure: Current   Smokeless tobacco: Former    Quit date: 10/17/2015  Vaping Use   Vaping status: Never Used  Substance and Sexual Activity   Alcohol use: Yes    Alcohol/week: 0.0 - 1.0 standard drinks of alcohol    Comment: OCCASIONALLY   Drug use: No   Sexual activity: Not on file  Other Topics Concern   Not on file  Social History Narrative   Married    Social Determinants of Health   Financial Resource Strain: Low Risk  (10/18/2022)   Overall Financial Resource Strain (CARDIA)    Difficulty of Paying Living Expenses: Not very hard  Food Insecurity: No Food Insecurity (10/18/2022)   Hunger Vital Sign    Worried About Running Out of Food in the Last Year: Never true    Ran Out of Food in the Last Year: Never true  Transportation Needs: No Transportation Needs (10/18/2022)   PRAPARE - Administrator, Civil Service (Medical): No    Lack of Transportation (Non-Medical): No  Physical Activity: Insufficiently Active (10/18/2022)   Exercise Vital Sign    Days of Exercise per Week: 2 days    Minutes of Exercise per Session: 20 min  Stress: No Stress Concern Present (10/18/2022)   [employer] of Occupational Health - Occupational Stress Questionnaire    Feeling of Stress : Only a little  Social Connections: Socially Integrated (10/18/2022)   Social Connection and Isolation Panel [NHANES]    Frequency of Communication with Friends and Family: More than three times a week    Frequency of  Social Gatherings with Friends and Family: Twice a week    Attends Religious Services: More than 4 times per year    Active Member of [employer] or Organizations: Yes    Attends Engineer, structural: More than 4 times per year    Marital Status: Married    Tobacco Counseling Ready to quit: Not Answered Counseling given: Not Answered   Clinical Intake:  Pre-visit preparation completed: Yes  Pain : No/denies pain     BMI - recorded: 26.39 Nutritional Status: BMI 25 -29 Overweight Nutritional Risks: None Diabetes: Yes CBG done?: Yes (FBS 155) CBG resulted in Enter/ Edit results?: No Did pt. bring in CBG monitor from home?: No  How often do you need to have someone help you when you read instructions, pamphlets, or other written materials from your doctor or pharmacy?: 1 - Never  Interpreter Needed?: No  Information entered by :: R. Jaella Weinert LPN   Activities of Daily Living    10/18/2022    7:57 AM  In your present state of health, do you have any difficulty performing the following activities:  Hearing? 1  Comment wears aids  Vision? 0  Difficulty concentrating or making decisions? 0  Walking or climbing stairs? 1  Dressing or bathing? 0  Doing errands, shopping? 0  Preparing Food and eating ? N  Using the Toilet? N  In the past six months, have you accidently leaked urine? N  Do you have problems with loss of bowel control? N  Managing your Medications? N  Managing your Finances? N  Housekeeping or managing your Housekeeping? N    Patient Care Team: Allegra Grana, [name] as PCP - General (Family Medicine)  Antonieta Iba, MD as PCP - Cardiology (Cardiology) Lanier Prude, MD as PCP - Electrophysiology (Cardiology)  Indicate any recent Medical Services you may have received from other than [facility] providers in the past year (date may be approximate).     Assessment:   This is a routine wellness examination for Deegan.  Hearing/Vision  screen Hearing Screening - Comments:: Wears aids Vision Screening - Comments:: glasses   Goals Addressed             This Visit's Progress    Patient Stated       Wants to increase exercise and lose some weight       Depression Screen    10/21/2022    9:14 AM 07/03/2022   11:40 AM 05/29/2022   10:05 AM 02/27/2022    1:38 PM 02/11/2022    3:55 PM 11/28/2021   11:25 AM 11/20/2021   12:56 PM  PHQ 2/9 Scores  PHQ - 2 Score 0 0 1 0 1 0 0  PHQ- 9 Score 2 0 4 2 7       Fall Risk    10/18/2022    7:57 AM 07/03/2022   11:40 AM 05/29/2022   10:05 AM 02/27/2022    1:38 PM 02/11/2022    3:55 PM  Fall Risk   Falls in the past year? 0 0 0 0 0  Number falls in past yr: 0 0 0 0 0  Injury with Fall? 0 0 0 0 0  Risk for fall due to : No Fall Risks No Fall Risks No Fall Risks No Fall Risks No Fall Risks  Follow up Falls prevention discussed;Falls evaluation completed Falls evaluation completed Falls evaluation completed Falls evaluation completed Falls evaluation completed    MEDICARE RISK AT HOME: Medicare Risk at Home Any stairs in or around the home?: Yes If so, are there any without handrails?: No Home free of loose throw rugs in walkways, pet beds, electrical cords, etc?: Yes Adequate lighting in your home to reduce risk of falls?: Yes Life alert?: No Use of a cane, walker or w/c?: No Grab bars in the bathroom?: Yes Shower chair or bench in shower?: No Elevated toilet seat or a handicapped toilet?: Yes      Cognitive Function:        10/21/2022    9:19 AM 10/12/2020   10:13 AM 10/06/2018   10:56 AM 07/04/2017    2:47 PM  6CIT Screen  What Year? 0 points 0 points 0 points 0 points  What month? 0 points 0 points 0 points 0 points  What time? 0 points 0 points 0 points 0 points  Count back from 20 0 points  0 points 0 points  Months in reverse 0 points 0 points 0 points 0 points  Repeat phrase 0 points  0 points 0 points  Total Score 0 points  0 points 0 points     Immunizations Immunization History  Administered Date(s) Administered   Fluad Quad(high Dose 65+) 10/13/2020, 09/28/2021   Influenza, High Dose Seasonal PF 11/01/2015, 10/20/2017, 09/28/2019   Influenza,inj,Quad PF,6+ Mos 10/08/2016   Influenza-Unspecified 10/01/2018   Moderna Sars-Covid-2 Vaccination 05/08/2020   PFIZER(Purple Top)SARS-COV-2 Vaccination 02/04/2019, 02/25/2019, 10/12/2019   PNEUMOCOCCAL CONJUGATE-20 02/07/2021   Pfizer Covid-19 Vaccine Bivalent Booster 22yrs & up 11/04/2020    TDAP status: Due, Education has been provided regarding the importance of this vaccine. Advised may receive this vaccine at local pharmacy or Health Dept. Aware to provide a copy of  the vaccination record if obtained from local pharmacy or Health Dept. Verbalized acceptance and understanding.  Flu Vaccine status: Due, Education has been provided regarding the importance of this vaccine. Advised may receive this vaccine at local pharmacy or Health Dept. Aware to provide a copy of the vaccination record if obtained from local pharmacy or Health Dept. Verbalized acceptance and understanding.  Pneumococcal vaccine status: Up to date  Covid-19 vaccine status: Information provided on how to obtain vaccines.   Qualifies for Shingles Vaccine? Yes   Zostavax completed No   Shingrix Completed?: No.    Education has been provided regarding the importance of this vaccine. Patient has been advised to call insurance company to determine out of pocket expense if they have not yet received this vaccine. Advised may also receive vaccine at local pharmacy or Health Dept. Verbalized acceptance and understanding.  Screening Tests Health Maintenance  Topic Date Due   DTaP/Tdap/Td (1 - Tdap) Never done   Zoster Vaccines- Shingrix (1 of 2) Never done   Medicare Annual Wellness (AWV)  10/12/2021   INFLUENZA VACCINE  08/15/2022   COVID-19 Vaccine (6 - 2023-24 season) 09/15/2022   OPHTHALMOLOGY EXAM  10/30/2022    HEMOGLOBIN A1C  11/29/2022   Diabetic kidney evaluation - eGFR measurement  02/13/2023   Diabetic kidney evaluation - Urine ACR  06/19/2023   FOOT EXAM  09/05/2023   Pneumonia Vaccine 62+ Years old  Completed   HPV VACCINES  Aged Out   Hepatitis C Screening  Discontinued    Health Maintenance  Health Maintenance Due  Topic Date Due   DTaP/Tdap/Td (1 - Tdap) Never done   Zoster Vaccines- Shingrix (1 of 2) Never done   Medicare Annual Wellness (AWV)  10/12/2021   INFLUENZA VACCINE  08/15/2022   COVID-19 Vaccine (6 - 2023-24 season) 09/15/2022    Colorectal cancer screening: No longer required.   Lung Cancer Screening: (Low Dose CT Chest recommended if Age 86-80 years, 20 pack-year currently smoking OR have quit w/in 15years.) does qualify. Followed by the Cancer Center    Additional Screening:  Hepatitis C Screening: does not qualify; NA age  Vision Screening: Recommended annual ophthalmology exams for early detection of glaucoma and other disorders of the eye. Is the patient up to date with their annual eye exam?  Yes  Who is the provider or what is the name of the office in which the patient attends annual eye exams? [facility] If pt is not established with a provider, would they like to be referred to a provider to establish care? No .   Dental Screening: Recommended annual dental exams for proper oral hygiene  Diabetic Foot Exam: Diabetic Foot Exam: Completed 08/2022  Community Resource Referral / Chronic Care Management: CRR required this visit?  No   CCM required this visit?  No     Plan:     I have personally reviewed and noted the following in the patient's chart:   Medical and social history Use of alcohol, tobacco or illicit drugs  Current medications and supplements including opioid prescriptions. Patient is currently taking opioid prescriptions. Information provided to patient regarding non-opioid alternatives. Patient advised to discuss non-opioid  treatment plan with their provider. Functional ability and status Nutritional status Physical activity Advanced directives List of other physicians Hospitalizations, surgeries, and ER visits in previous 12 months Vitals Screenings to include cognitive, depression, and falls Referrals and appointments  In addition, I have reviewed and discussed with patient certain preventive protocols, quality metrics,  and best practice recommendations. A written personalized care plan for preventive services as well as general preventive health recommendations were provided to patient.     Sydell Axon, LPN   [id number]   After Visit Summary: (MyChart) Due to this being a telephonic visit, the after visit summary with patients personalized plan was offered to patient via MyChart   Nurse Notes: None

## 2022-10-22 ENCOUNTER — Other Ambulatory Visit: Payer: Self-pay | Admitting: Cardiovascular Disease

## 2022-10-22 NOTE — Telephone Encounter (Signed)
Patient is calling back for update. Please advise  

## 2022-10-23 ENCOUNTER — Telehealth: Payer: Self-pay

## 2022-10-23 ENCOUNTER — Encounter: Payer: Self-pay | Admitting: Family

## 2022-10-23 ENCOUNTER — Ambulatory Visit: Payer: Medicare Other | Admitting: Family

## 2022-10-23 VITALS — BP 98/60 | HR 59 | Temp 97.8°F | Ht 73.0 in | Wt 210.6 lb

## 2022-10-23 DIAGNOSIS — I10 Essential (primary) hypertension: Secondary | ICD-10-CM | POA: Diagnosis not present

## 2022-10-23 DIAGNOSIS — G8929 Other chronic pain: Secondary | ICD-10-CM | POA: Diagnosis not present

## 2022-10-23 DIAGNOSIS — E118 Type 2 diabetes mellitus with unspecified complications: Secondary | ICD-10-CM | POA: Diagnosis not present

## 2022-10-23 DIAGNOSIS — Z7985 Long-term (current) use of injectable non-insulin antidiabetic drugs: Secondary | ICD-10-CM

## 2022-10-23 DIAGNOSIS — M549 Dorsalgia, unspecified: Secondary | ICD-10-CM

## 2022-10-23 DIAGNOSIS — Z7984 Long term (current) use of oral hypoglycemic drugs: Secondary | ICD-10-CM

## 2022-10-23 LAB — POCT GLYCOSYLATED HEMOGLOBIN (HGB A1C): Hemoglobin A1C: 6.9 % — AB (ref 4.0–5.6)

## 2022-10-23 LAB — COMPREHENSIVE METABOLIC PANEL
ALT: 37 U/L (ref 0–53)
AST: 46 U/L — ABNORMAL HIGH (ref 0–37)
Albumin: 3.9 g/dL (ref 3.5–5.2)
Alkaline Phosphatase: 76 U/L (ref 39–117)
BUN: 22 mg/dL (ref 6–23)
CO2: 27 meq/L (ref 19–32)
Calcium: 9.3 mg/dL (ref 8.4–10.5)
Chloride: 103 meq/L (ref 96–112)
Creatinine, Ser: 0.96 mg/dL (ref 0.40–1.50)
GFR: 73.48 mL/min (ref >60.00)
Glucose, Bld: 197 mg/dL — ABNORMAL HIGH (ref 70–99)
Potassium: 4.4 meq/L (ref 3.5–5.1)
Sodium: 139 meq/L (ref 135–145)
Total Bilirubin: 1.4 mg/dL — ABNORMAL HIGH (ref 0.2–1.2)
Total Protein: 6.4 g/dL (ref 6.0–8.3)

## 2022-10-23 MED ORDER — CONTINUOUS GLUCOSE MONITOR SUP MISC
1.0000 [IU] | Freq: Every day | 0 refills | Status: DC
Start: 2022-10-23 — End: 2023-06-04

## 2022-10-23 MED ORDER — CONTINUOUS GLUCOSE RECEIVER DEVI
1.0000 [IU] | Freq: Every day | 0 refills | Status: DC
Start: 2022-10-23 — End: 2023-06-04

## 2022-10-23 NOTE — Progress Notes (Signed)
Assessment & Plan:  Controlled type 2 diabetes mellitus with complication, without long-term current use of insulin (HCC) Assessment & Plan: Lab Results  Component Value Date   HGBA1C 6.9 (A) 10/23/2022   Chronic, stable.  Discussed the benefits of continuous glucose monitor in regards to alarm features and data.  I ordered today.  continue Glipizide 10 mg twice daily, Ozempic 1 mg  Orders: -     POCT glycosylated hemoglobin (Hb A1C) -     Comprehensive metabolic panel -     Continuous Glucose Monitor Sup; 1 Units by Does not apply route daily.  Dispense: 1 Units; Refill: 0 -     Continuous Glucose Receiver; 1 Units by Does not apply route daily.  Dispense: 1 Units; Refill: 0  Chronic back pain, unspecified back location, unspecified back pain laterality  Essential hypertension Assessment & Plan: Blood pressure has been labile.  Advised to decrease lisinopril from 10 mg daily to 5 mg daily.  Continue amiodarone 200 mg daily, carvedilol 3.125 mg twice daily, spironolactone 25 mg.  Advised to suspend use midodrine 2.5 mg prn .I have sent a note Dr Mariah Milling to make him aware of recommendation and for advice.       Return precautions given.   Risks, benefits, and alternatives of the medications and treatment plan prescribed today were discussed, and patient expressed understanding.   Education regarding symptom management and diagnosis given to patient on AVS either electronically or printed.  Return in about 3 months (around 01/23/2023).  Rennie Plowman, [name]  Subjective:    Patient ID: [name] [name], male    DOB: 1940/10/12, 82 y.o.   MRN: [id number]  CC: [name] [name] is a 82 y.o. male who presents today for follow up.   HPI: Accompanied by wife He is concerned with low blood pressure and oscillating blood pressure readings.   He has one event of feeling 'dizzy' which was due to 'blood sugar.' He was driving home at that time 12:30pm, he felt dizzy and blood sugar was  68. He had had breakfast, not lunch.     He reports not 'many low blood sugar readings at all'. He is concerned FBG have been 'higher' than normal. FBG 172, 155, 115.   At home today 138/72, HR 57. Lowest one week 94/49.   Denies syncope, cp, palpitations, leg swelling.   Previous on glipizide 5 mg with breakfast, 10 mg with dinner.  He is increased to glipizide 10 mg twice daily.  Compliant with Ozempic 1 mg  Last seen by cardiology 04/24/2022, Sherie Don  For follow-up heart failure reduced ejection from patient, CAD, VT and pacemaker  Compliant with amiodarone 200 mg daily, carvedilol 3.125 mg twice daily, lisinopril 10 mg daily, spironolactone 25 mg  He takes midodrine 2.5mg   as prescribed by Dr Mariah Milling to use as needed.   4 months ago microalbuminuria had resolved, 06/2022  Requests refill of tramadol  Allergies: Metformin and related, Azithromycin, Other, Percocet [oxycodone-acetaminophen], and Jardiance [empagliflozin] Current Outpatient Medications on File Prior to Visit  Medication Sig Dispense Refill   amiodarone (PACERONE) 200 MG tablet Take 1 tablet (200 mg total) by mouth daily. 90 tablet 3   aspirin EC 81 MG tablet Take 1 tablet (81 mg total) by mouth daily. Swallow whole. 90 tablet 3   bisacodyl (DULCOLAX) 5 MG EC tablet Take 5 mg by mouth daily as needed for moderate constipation.     Blood Glucose Monitoring Suppl (ONETOUCH VERIO FLEX SYSTEM) w/Device KIT USE  TO CHECK BLOOD SUGAR EVERY MORNING AND EVERY NIGHT AT BEDTIME AS DIRECTED.     Blood Glucose Monitoring Suppl DEVI 1 each by Does not apply route in the morning and at bedtime. May substitute to any manufacturer covered by patient's insurance. 1 each 0   carvedilol (COREG) 3.125 MG tablet TAKE 1 TABLET(3.125 MG) BY MOUTH TWICE DAILY 180 tablet 0   clopidogrel (PLAVIX) 75 MG tablet TAKE 1 TABLET(75 MG) BY MOUTH DAILY--Due for yearly follow up.  PLEASE CALL OFFICE TO SCHEDULE APPOINTMENT PRIOR TO NEXT REFILL (first  attempt) 30 tablet 0   dextromethorphan-guaiFENesin (MUCINEX DM) 30-600 MG 12hr tablet Take 1 tablet by mouth 2 (two) times daily.     docusate sodium (COLACE) 100 MG capsule Take 100 mg by mouth in the morning, at noon, and at bedtime.     ezetimibe (ZETIA) 10 MG tablet TAKE 1 TABLET(10 MG) BY MOUTH DAILY 90 tablet 2   famotidine (PEPCID) 20 MG tablet Take 20 mg by mouth daily with lunch.     fluticasone (FLONASE) 50 MCG/ACT nasal spray Place into both nostrils daily as needed for allergies or rhinitis.     gabapentin (NEURONTIN) 100 MG capsule TAKE 1 CAPSULE(100 MG) BY MOUTH THREE TIMES DAILY (Patient taking differently: Take 100 mg by mouth as needed.) 90 capsule 3   glipiZIDE (GLUCOTROL) 10 MG tablet TAKE 1/2 TABLET BY MOUTH EVERY MORNING WITH BREAKFAST AND 1 TABLET EVERY EVENING WITH SUPPER. 120 tablet 3   lisinopril (ZESTRIL) 10 MG tablet TAKE 1 TABLET BY MOUTH DAILY 90 tablet 1   midodrine (PROAMATINE) 5 MG tablet Take 1 tablet (5 mg total) by mouth 2 (two) times daily as needed. Take when BP does drop less than 110 when standing. Additional dose at 2 pm if still dropping below 110 when standing 90 tablet 3   Multiple Vitamin (MULTIVITAMIN) tablet Take 1 tablet by mouth daily with lunch.     Omega-3 Fatty Acids (FISH OIL) 1000 MG CAPS Take 1,000 mg by mouth daily with lunch.     Omega-3 Fatty Acids (FISH OIL) 500 MG CAPS Take 500 mg by mouth every other day.     ONETOUCH VERIO test strip USE TO CHECK BLOOD SUGAR TWICE DAILY AS DIRECTED 100 strip 1   pantoprazole (PROTONIX) 40 MG tablet Take 1 tablet (40 mg total) by mouth daily. 90 tablet 3   psyllium (METAMUCIL) 58.6 % powder Take 1 packet by mouth at bedtime.     rosuvastatin (CRESTOR) 10 MG tablet Take 1 tablet (10 mg total) by mouth daily. 90 tablet 3   Semaglutide, 1 MG/DOSE, 4 MG/3ML SOPN Inject 1 mg as directed once a week. 3 mL 3   sodium chloride (OCEAN) 0.65 % SOLN nasal spray Place 1 spray into both nostrils as needed for  congestion.     spironolactone (ALDACTONE) 25 MG tablet TAKE 1 TABLET(25 MG) BY MOUTH DAILY 90 tablet 0   tamsulosin (FLOMAX) 0.4 MG CAPS capsule TAKE ONE CAPSULE BY MOUTH DAILY 90 capsule 3   traMADol (ULTRAM) 50 MG tablet TAKE 1 TABLET BY MOUTH EVERY 12 HOURS AS NEEDED 60 tablet 2   No current facility-administered medications on file prior to visit.    Review of Systems  Constitutional:  Negative for chills and fever.  Respiratory:  Negative for cough.   Cardiovascular:  Negative for chest pain and palpitations.  Gastrointestinal:  Negative for nausea and vomiting.  Neurological:  Negative for dizziness (episodic).  Objective:    BP 98/60   Pulse (!) 59   Temp 97.8 F (36.6 C) (Oral)   Ht 6\' 1"  (1.854 m)   Wt 210 lb 9.6 oz (95.5 kg)   SpO2 95%   BMI 27.79 kg/m  BP Readings from Last 3 Encounters:  10/23/22 98/60  10/21/22 131/75  10/09/22 111/61   Wt Readings from Last 3 Encounters:  10/23/22 210 lb 9.6 oz (95.5 kg)  10/21/22 200 lb (90.7 kg)  09/04/22 210 lb 9.6 oz (95.5 kg)  06/28/22 212 lbs.  Of note: weight reported , not weighed 10/21/2022  Weight at home, without clothes, 102 lbs.   Physical Exam Vitals reviewed.  Constitutional:      Appearance: He is well-developed.  Cardiovascular:     Rate and Rhythm: Regular rhythm.     Heart sounds: Normal heart sounds.  Pulmonary:     Effort: Pulmonary effort is normal. No respiratory distress.     Breath sounds: Normal breath sounds. No wheezing, rhonchi or rales.  Skin:    General: Skin is warm and dry.  Neurological:     Mental Status: He is alert.  Psychiatric:        Speech: Speech normal.        Behavior: Behavior normal.

## 2022-10-23 NOTE — Telephone Encounter (Signed)
Patient states we were sending a prescription into [facility] for a continuous glucose monitor.  Patient states he went to pick up the new monitor from his pharmacy today and it was the same monitor (On[employer]ouch [employer]) that he has had in the past.  Patient states he is supposed to have the continuous glucose monitor.  Patient states he has a nurse visit scheduled with [place] tomorrow to learn how to use his continuous glucose monitor.  Patient would like a call back so he will know if he needs to go back to the pharmacy.

## 2022-10-23 NOTE — Telephone Encounter (Signed)
Dr Dewaine Oats and Raynelle Fanning,  I saw mutual patient this morning for concerns for low blood pressure.  Fortunately, he is not profoundly symptomatic.  2 episodes of feeling dizzy upon standing.  No syncope, SOB, palpitations.    Blood pressure readings have been a bit labile running as low as 94/49.  I advised him to decrease lisinopril from 10 mg to 5 mg and to monitor.  Certainly let me know if you would recommend something else.  He is going to continue to monitor blood pressures and call me with readings.   He had taken a dose previous prescribed midodrine 2.5 mg.  I advised him to stop taking midodrine for now.   He will remain compliant with amiodarone 200 mg daily, carvedilol 3.125 mg twice daily,spironolactone 25 mg.  He is also taking flomax 0.4mg .

## 2022-10-23 NOTE — Telephone Encounter (Signed)
Fyi, also spoke with pharmacy. They are waiting for insurance. Pt made aware

## 2022-10-23 NOTE — Telephone Encounter (Signed)
Pt also sent mychart msg.  Please see mychart encounter

## 2022-10-23 NOTE — Patient Instructions (Addendum)
Decrease lisinopril from 10mg  to 5mg  to allow blood pressure to raise.  I would hold on use of midodrine.  Trial increase gabapentin at bedtime to 200mg  if you want to use less tramadol .    I have ordered a continuous glucose monitor for blood sugar.

## 2022-10-23 NOTE — Assessment & Plan Note (Addendum)
Blood pressure has been labile.  Advised to decrease lisinopril from 10 mg daily to 5 mg daily.  Continue amiodarone 200 mg daily, carvedilol 3.125 mg twice daily, spironolactone 25 mg.  Advised to suspend use midodrine 2.5 mg prn .I have sent a note Dr Mariah Milling to make him aware of recommendation and for advice.

## 2022-10-23 NOTE — Assessment & Plan Note (Signed)
Lab Results  Component Value Date   HGBA1C 6.9 (A) 10/23/2022   Chronic, stable.  Discussed the benefits of continuous glucose monitor in regards to alarm features and data.  I ordered today.  continue Glipizide 10 mg twice daily, Ozempic 1 mg

## 2022-10-24 ENCOUNTER — Ambulatory Visit: Payer: Medicare Other

## 2022-10-24 NOTE — Progress Notes (Signed)
Date:  10/25/2022   ID:  [name] [name], DOB June 28, 1940, MRN [id number]  Patient Location:  [address] [place] [address]   Provider location:   [facility], [place] office  PCP:  Allegra Grana, [name]  Cardiologist:  [facility]  Chief Complaint  Patient presents with   12 month follow up     Patient c/o left side muscle spasms & a decrease in blood pressure. Medications reviewed by the patient verbally.     History of Present Illness:    [name] [name] is a 82 y.o. male  past medical history of CAD ,NSTEMI,  stenting was 64, and  2005 , 4/21, 6/05 May 2019 , atherectomy, drug-eluting stent  STENT RESOLUTE ONYX 4.5X18, postdilated LAD and angioplasty of diagonal VT followed by EP DM II HTN Hyperlipidemia Melanoma Abdominal aorta atherosclerosis ICD 01/2020 for EF 35% to 40% Ef in 6/23: ef 55 to 60% Who presents for routine follow-up of his coronary artery disease  Last seen by myself in clinic October 2023 Last seen by EP April 2024  Recent phone call with concern for labile and low blood pressure Periodic blood pressure down to 90 systolic Lisinopril decreased down to 5 from 10 mg daily by Rennie Plowman Notes indicating 2 episodes of feeling dizzy upon standing  On Flomax, carvedilol 3.125 twice daily, amiodarone 200 daily, spironolactone 25  Last week systolic 65,orthostasis  Has tried midodrine x 1  Lost 15 pounds on ozempic, stable recently 201  Past several days reports blood pressure has been stable  Denies significant PVCs on current regiment ICD downloads reviewed, no significant VT Remains on carvedilol 3.125 twice daily amiodarone 200 daily  EKG personally reviewed by myself on todays visit EKG Interpretation Date/Time:  Friday October 25 2022 13:56:13 EDT Ventricular Rate:  52 PR Interval:  148 QRS Duration:  152 QT Interval:  522 QTC Calculation: 485 R Axis:   259  Text  Interpretation: Atrial-sensed ventricular-paced rhythm with frequent AV dual-paced complexes Biventricular pacemaker detected When compared with ECG of 09-Feb-2022 10:34, Vent. rate has decreased BY  23 BPM Confirmed by Julien Nordmann [phone]) on 10/25/2022 1:57:56 PM   Recent imaging studies reviewed Echo 6/23 Left ventricular ejection fraction, by estimation, is 55 to 60%. The  left ventricle has normal function. The left ventricle has no regional  wall motion abnormalities. Left ventricular diastolic parameters are  consistent with Grade I diastolic  dysfunction (impaired relaxation).   2. Right ventricular systolic function is normal. The right ventricular  size is normal.   3. The mitral valve is normal in structure. No evidence of mitral valve  regurgitation.   4. The aortic valve is tricuspid. Aortic valve regurgitation is not  visualized. Mild to moderate aortic valve stenosis. Aortic valve mean  gradient measures 18.0 mmHg.  EKG personally reviewed by myself on todays visit NSR atrial sensed V paced rate 62 bpm  Other past medical history reviewed CT ABD/pelvis 07/11/2020 Mild diffuse aortic athero Small pericardial effusion, anterior  Followed by Dr. Lalla Brothers, No sx of VT  Cath 06/2019:  Catheterization chronically occluded RCA collaterals from left to right, Severe proximal LAD disease 80%, stent placed. Underwent angioplasty of diagonal vessel, 75% lesion Patent stent in the ramus  Echo 12/2019 Left ventricular ejection fraction, by estimation, is 35 to 40%.\ ICD implant 02/02/2020  RV lead thresholds ahd risen significantly, was decided to pursue lead revision with suspect microdislodgement.  Echocardiogram December 2021 Ejection  fraction 35 to 40%  Dr. Lalla Brothers noted his eliquis 2/2 LV thrombus that had since resolved and discontinued it.  Non-STEMI June 2021: Hospital Catheterization chronically occluded RCA collaterals from left to right Severe proximal LAD  disease 80%, stent placed Angioplasty of diagonal vessel, 75% lesion  ZIO monitor with nonsustained VT, PVCs 10% burden  Cardiac MRI Ejection fraction 35%  LGE in the basal and mid inferior and inferior lateral walls, dyskinesis of the basal and inferior inferolateral walls and a left ventricular mural thrombus located on the inferolateral wall.   Past Medical History:  Diagnosis Date   Aortic insufficiency    a. noted on TTE 2015; b. 06/2019 Echo: AI not visualized.   Arthritis    CAD (coronary artery disease)    a. remote PCI in 1991 and 2005; b. MV 3/15: old inferior MI, no ischemia, LVEF 50%, slight inferior wall hypokniesis; c. 06/2019 NSTEMI/PCI: LM nl, LAD 80p/m (Atherectomy & 4.5x18 Resolute Onyx DES), 69m/d, D1 75 (PTCA), RI patent stent, LCX nl, RCA 100p, RPAV fills via L->R collats from LCX.   Chicken pox    Colon polyps    4 pre-cancerous    Diverticulitis    DM type 2 (diabetes mellitus, type 2) (HCC)    Family history of adverse reaction to anesthesia    GERD (gastroesophageal reflux disease)    Heart murmur    History of kidney stones    HOH (hard of hearing)    Hyperlipidemia    Hypertension    Ischemic cardiomyopathy    a. TTE 2015: EF  50-55%, mild global HK; b. 06/2019 Echo: EF 45-50%, Gr1 DD, basal inf AK. Triv MR.   Kidney stones    Melanoma (HCC) 1980   Resected from his back   Mitral regurgitation    a. noted on TTE 2015   Myocardial infarction [facility])    Past Surgical History:  Procedure Laterality Date   BIV UPGRADE N/A 02/26/2021   Procedure: BIV ICD UPGRADE;  Surgeon: Lanier Prude, MD;  Location[facility] INVASIVE CV LAB;  Service: Cardiovascular;  Laterality: N/A;   CHOLECYSTECTOMY  2012   COLONOSCOPY     in 2003 with polyp removed and leak anastomosis had to have open abdominal surgery    CORONARY ANGIOPLASTY WITH STENT PLACEMENT  1991 & 2005   CORONARY ATHERECTOMY N/A 07/08/2019   Procedure: CORONARY ATHERECTOMY;  Surgeon: Corky Crafts,  MD;  Location: [facility] INVASIVE CV LAB;  Service: Cardiovascular;  Laterality: N/A;   CORONARY BALLOON ANGIOPLASTY N/A 07/08/2019   Procedure: CORONARY BALLOON ANGIOPLASTY;  Surgeon: Corky Crafts, MD;  Location: [facility] INVASIVE CV LAB;  Service: Cardiovascular;  Laterality: N/A;  diagonal    CORONARY PRESSURE/FFR STUDY N/A 07/07/2019   Procedure: INTRAVASCULAR PRESSURE WIRE/FFR STUDY;  Surgeon: Yvonne Kendall, MD;  Location: [facility] INVASIVE CV LAB;  Service: Cardiovascular;  Laterality: N/A;   CORONARY STENT INTERVENTION N/A 07/08/2019   Procedure: CORONARY STENT INTERVENTION;  Surgeon: Corky Crafts, MD;  Location: [facility] INVASIVE CV LAB;  Service: Cardiovascular;  Laterality: N/A;  lad   CORONARY ULTRASOUND/IVUS N/A 07/08/2019   Procedure: Intravascular Ultrasound/IVUS;  Surgeon: Corky Crafts, MD;  Location: [facility] INVASIVE CV LAB;  Service: Cardiovascular;  Laterality: N/A;   CYSTOSCOPY/URETEROSCOPY/HOLMIUM LASER/STENT PLACEMENT Right 01/02/2018   Procedure: CYSTOSCOPY/URETEROSCOPY/HOLMIUM LASER/STENT PLACEMENT;  Surgeon: Sondra Come, MD;  Location: [facility] ORS;  Service: Urology;  Laterality: Right;   ESOPHAGOGASTRODUODENOSCOPY (EGD) WITH PROPOFOL N/A 11/19/2016   Procedure: ESOPHAGOGASTRODUODENOSCOPY (EGD) WITH PROPOFOL;  Surgeon: Midge Minium, MD;  Location: [facility] ENDOSCOPY;  Service: Endoscopy;  Laterality: N/A;   ESOPHAGOGASTRODUODENOSCOPY (EGD) WITH PROPOFOL N/A 02/02/2019   Procedure: ESOPHAGOGASTRODUODENOSCOPY (EGD) WITH PROPOFOL;  Surgeon: Midge Minium, MD;  Location: [facility] ENDOSCOPY;  Service: Endoscopy;  Laterality: N/A;   ICD IMPLANT N/A 02/02/2020   Procedure: ICD IMPLANT;  Surgeon: Lanier Prude, MD;  Location: [facility] INVASIVE CV LAB;  Service: Cardiovascular;  Laterality: N/A;   LEAD REVISION/REPAIR N/A 02/16/2020   Procedure: LEAD REVISION/REPAIR;  Surgeon: Regan Lemming, MD;  Location: [facility] INVASIVE CV LAB;  Service: Cardiovascular;  Laterality: N/A;   LEFT HEART  CATH N/A 07/08/2019   Procedure: Left Heart Cath;  Surgeon: Corky Crafts, MD;  Location: [facility] INVASIVE CV LAB;  Service: Cardiovascular;  Laterality: N/A;   LEFT HEART CATH AND CORONARY ANGIOGRAPHY N/A 07/07/2019   Procedure: LEFT HEART CATH AND CORONARY ANGIOGRAPHY;  Surgeon: Yvonne Kendall, MD;  Location: [facility] INVASIVE CV LAB;  Service: Cardiovascular;  Laterality: N/A;   LITHOTRIPSY  2015   MELANOMA EXCISION  1980   malignant   NERVE SURGERY  2015   ulna nerve   pre cancer lesion from scalp  09/2022   TONSILLECTOMY  1945   WISDOM TOOTH EXTRACTION       Current Meds  Medication Sig   amiodarone (PACERONE) 200 MG tablet Take 1 tablet (200 mg total) by mouth daily.   aspirin EC 81 MG tablet Take 1 tablet (81 mg total) by mouth daily. Swallow whole.   bisacodyl (DULCOLAX) 5 MG EC tablet Take 5 mg by mouth daily as needed for moderate constipation.   carvedilol (COREG) 3.125 MG tablet TAKE 1 TABLET(3.125 MG) BY MOUTH TWICE DAILY   clopidogrel (PLAVIX) 75 MG tablet TAKE 1 TABLET(75 MG) BY MOUTH DAILY--Due for yearly follow up.  PLEASE CALL OFFICE TO SCHEDULE APPOINTMENT PRIOR TO NEXT REFILL (first attempt)   dextromethorphan-guaiFENesin (MUCINEX DM) 30-600 MG 12hr tablet Take 1 tablet by mouth 2 (two) times daily.   docusate sodium (COLACE) 100 MG capsule Take 100 mg by mouth in the morning, at noon, and at bedtime.   ezetimibe (ZETIA) 10 MG tablet TAKE 1 TABLET(10 MG) BY MOUTH DAILY   famotidine (PEPCID) 20 MG tablet Take 20 mg by mouth daily with lunch.   fluticasone (FLONASE) 50 MCG/ACT nasal spray Place into both nostrils daily as needed for allergies or rhinitis.   gabapentin (NEURONTIN) 100 MG capsule TAKE 1 CAPSULE(100 MG) BY MOUTH THREE TIMES DAILY (Patient taking differently: Take 100 mg by mouth as needed.)   glipiZIDE (GLUCOTROL) 10 MG tablet TAKE 1/2 TABLET BY MOUTH EVERY MORNING WITH BREAKFAST AND 1 TABLET EVERY EVENING WITH SUPPER.   lisinopril (ZESTRIL) 10 MG tablet  Take 5 mg by mouth daily.   Multiple Vitamin (MULTIVITAMIN) tablet Take 1 tablet by mouth daily with lunch.   Omega-3 Fatty Acids (FISH OIL) 1000 MG CAPS Take 1,000 mg by mouth daily with lunch.   Omega-3 Fatty Acids (FISH OIL) 500 MG CAPS Take 500 mg by mouth every other day.   pantoprazole (PROTONIX) 40 MG tablet Take 1 tablet (40 mg total) by mouth daily.   psyllium (METAMUCIL) 58.6 % powder Take 1 packet by mouth at bedtime.   rosuvastatin (CRESTOR) 10 MG tablet Take 1 tablet (10 mg total) by mouth daily.   Semaglutide, 1 MG/DOSE, 4 MG/3ML SOPN Inject 1 mg as directed once a week.   sodium chloride (OCEAN) 0.65 % SOLN nasal spray Place 1 spray into both  nostrils as needed for congestion.   spironolactone (ALDACTONE) 25 MG tablet TAKE 1 TABLET(25 MG) BY MOUTH DAILY   tamsulosin (FLOMAX) 0.4 MG CAPS capsule TAKE ONE CAPSULE BY MOUTH DAILY   traMADol (ULTRAM) 50 MG tablet TAKE 1 TABLET BY MOUTH EVERY 12 HOURS AS NEEDED   [DISCONTINUED] midodrine (PROAMATINE) 5 MG tablet Take 1 tablet (5 mg total) by mouth 2 (two) times daily as needed. Take when BP does drop less than 110 when standing. Additional dose at 2 pm if still dropping below 110 when standing     Allergies:   Metformin and related, Azithromycin, Other, Percocet [oxycodone-acetaminophen], and Jardiance [empagliflozin]   Social History   Tobacco Use   Smoking status: Some Days    Types: Pipe    Passive exposure: Current   Smokeless tobacco: Former    Quit date: 10/17/2015  Vaping Use   Vaping status: Never Used  Substance Use Topics   Alcohol use: Yes    Alcohol/week: 0.0 - 1.0 standard drinks of alcohol    Comment: OCCASIONALLY   Drug use: No     Family Hx: The patient's family history includes Colon cancer in his father; Diabetes in his father and mother; Heart attack in his mother; Heart disease in his mother; Hyperlipidemia in his mother; Hypertension in his mother; Kidney cancer in his father; Liver cancer in his  father; Lung cancer in his father. There is no history of Bladder Cancer or Prostate cancer.  ROS:   Please see the history of present illness.    Review of Systems  Constitutional: Negative.   HENT: Negative.    Respiratory: Negative.    Cardiovascular: Negative.   Gastrointestinal: Negative.   Neurological: Negative.   Psychiatric/Behavioral: Negative.    All other systems reviewed and are negative.   Labs/Other Tests and Data Reviewed:    Recent Labs: 02/09/2022: B Natriuretic Peptide 82.8; Hemoglobin 14.1; Platelets 160 04/25/2022: TSH 3.48 10/23/2022: ALT 37; BUN 22; Creatinine, Ser 0.96; Potassium 4.4; Sodium 139   Recent Lipid Panel Lab Results  Component Value Date/Time   CHOL 118 06/19/2022 10:09 AM   TRIG 212.0 (H) 06/19/2022 10:09 AM   HDL 39.90 06/19/2022 10:09 AM   CHOLHDL 3 06/19/2022 10:09 AM   LDLCALC 35 12/21/2019 09:19 AM   LDLDIRECT 64.0 06/19/2022 10:09 AM    Wt Readings from Last 3 Encounters:  10/25/22 211 lb 2 oz (95.8 kg)  10/23/22 210 lb 9.6 oz (95.5 kg)  10/21/22 200 lb (90.7 kg)     Exam:    BP 132/64 (BP Location: Left Arm, Patient Position: Sitting, Cuff Size: Normal)   Pulse (!) 52   Ht 6\' 1"  (1.854 m)   Wt 211 lb 2 oz (95.8 kg)   SpO2 97%   BMI 27.85 kg/m   Constitutional:  oriented to person, place, and time. No distress.  HENT:  Head: Grossly normal Eyes:  no discharge. No scleral icterus.  Neck: No JVD, no carotid bruits  Cardiovascular: Regular rate and rhythm, 2/6 SEM RSB Pulmonary/Chest: Clear to auscultation bilaterally, no wheezes or rails Abdominal: Soft.  no distension.  no tenderness.  Musculoskeletal: Normal range of motion Neurological:  normal muscle tone. Coordination normal. No atrophy Skin: Skin warm and dry Psychiatric: normal affect, pleasant  ASSESSMENT & PLAN:    Orthostatic hypotension Etiology unclear, may have been fighting an infection Less likely hypovolemia No medication changes made Recommend  midodrine 5 mg as needed for systolic pressure less than 100 Close monitoring  of blood pressure  Sustained VT Monitored through ICD downloads, no recent events Device download showing no significant arrhythmia Denies significant PVCs, continue on amio and coreg  Atherosclerosis of native coronary artery with stable angina pectoris, unspecified whether native or transplanted heart [facility]) Prior history of cardiomyopathy ejection fraction 35 to 40% December 2022 Normalized ejection fraction June 2023 on echo Continue carvedilol spironolactone and lisinopril Currently with no symptoms of angina. No further workup at this time. Continue current medication regimen.  Diarrhea:  Reports more constipation   cardiomyopathy, ischemic Coreg, lisinopril, spironolactone EF 35 to 40% now normalized EF of 55 to 60% No further testing  Aortic valve stenosis Echocardiogram ordered  Nonsustained VT, PVCs Has  ICD, followed EP Lead revision successful sustained VT requiring antitachycardia pacing No significant arrhythmia on device download  Atherosclerosis of abdominal aorta (HCC)  mild disease, no claudication sx Cholesterol at goal  Type 2 diabetes mellitus with other circulatory complication, without long-term current use of insulin (HCC) Stable, A1c trending higher 6.9 Remains on Ozempic  Carotid artery disease, unspecified laterality (HCC) U/s in 2017 Minor carotid atherosclerosis.  No further work-up needed    Signed, Julien Nordmann, MD  10/25/2022 2:23 PM    [facility] [address], [place], [place] [address]

## 2022-10-24 NOTE — Telephone Encounter (Signed)
Pt is being seen on 10/11

## 2022-10-25 ENCOUNTER — Encounter: Payer: Self-pay | Admitting: Cardiovascular Disease

## 2022-10-25 ENCOUNTER — Ambulatory Visit: Payer: Medicare Other | Attending: Cardiovascular Disease | Admitting: Cardiovascular Disease

## 2022-10-25 VITALS — BP 132/64 | HR 52 | Ht 73.0 in | Wt 211.1 lb

## 2022-10-25 DIAGNOSIS — Z95810 Presence of automatic (implantable) cardiac defibrillator: Secondary | ICD-10-CM | POA: Diagnosis not present

## 2022-10-25 DIAGNOSIS — I472 Ventricular tachycardia, unspecified: Secondary | ICD-10-CM | POA: Diagnosis not present

## 2022-10-25 DIAGNOSIS — I442 Atrioventricular block, complete: Secondary | ICD-10-CM

## 2022-10-25 DIAGNOSIS — I779 Disorder of arteries and arterioles, unspecified: Secondary | ICD-10-CM

## 2022-10-25 DIAGNOSIS — I513 Intracardiac thrombosis, not elsewhere classified: Secondary | ICD-10-CM

## 2022-10-25 DIAGNOSIS — I25118 Atherosclerotic heart disease of native coronary artery with other forms of angina pectoris: Secondary | ICD-10-CM

## 2022-10-25 DIAGNOSIS — I255 Ischemic cardiomyopathy: Secondary | ICD-10-CM | POA: Diagnosis not present

## 2022-10-25 DIAGNOSIS — I5022 Chronic systolic (congestive) heart failure: Secondary | ICD-10-CM

## 2022-10-25 MED ORDER — MIDODRINE HCL 5 MG PO TABS: 5.0000 mg | ORAL_TABLET | Freq: Three times a day (TID) | ORAL | 1 refills | Status: DC | PRN

## 2022-10-25 NOTE — Patient Instructions (Addendum)
Medication Instructions:  No changes  If you need a refill on your cardiac medications before your next appointment, please call your pharmacy.   Lab work: No new labs needed  Testing/Procedures: Your physician has requested that you have an echocardiogram. Echocardiography is a painless test that uses sound waves to create images of your heart. It provides your doctor with information about the size and shape of your heart and how well your heart's chambers and valves are working. This procedure takes approximately one hour. There are no restrictions for this procedure. Please do NOT wear cologne, perfume, aftershave, or lotions (deodorant is allowed). Please arrive 15 minutes prior to your appointment time.   Follow-Up: At [facility], you and your health needs are our priority.  As part of our continuing mission to provide you with exceptional heart care, we have created designated Provider Care Teams.  These Care Teams include your primary Cardiologist (physician) and Advanced Practice Providers (APPs -  Physician Assistants and Nurse Practitioners) who all work together to provide you with the care you need, when you need it.  You will need a follow up appointment in 6 months  Providers on your designated Care Team:   Nicolasa Ducking, NP Eula Listen, PA-C Cadence Fransico Michael, [place]  COVID-19 Vaccine Information can be found at: [email or web address] For questions related to vaccine distribution or appointments, please email [email or web address] or call [phone].

## 2022-10-25 NOTE — Telephone Encounter (Signed)
Concerns discussed at office visit  on 10/25/22

## 2022-10-29 NOTE — Telephone Encounter (Signed)
Spoke with [facility] pt needs an PA for Muddy 3 sensor and reader.

## 2022-11-05 ENCOUNTER — Telehealth: Payer: Self-pay

## 2022-11-05 ENCOUNTER — Other Ambulatory Visit (HOSPITAL_COMMUNITY): Payer: Self-pay

## 2022-11-05 NOTE — Telephone Encounter (Signed)
Pharmacy Patient Advocate Encounter   Received notification from Patient Advice Request messages that prior authorization for FreeStyle Libre 3 Sensor is required/requested.   Insurance verification completed.   The patient is insured through [facility] .   Per test claim: PA required; PA submitted to [facility] via CoverMyMeds Key/confirmation #/EOC [id number] Status is pending

## 2022-11-06 ENCOUNTER — Other Ambulatory Visit: Payer: Self-pay | Admitting: Family

## 2022-11-06 MED ORDER — AZELASTINE HCL 0.1 % NA SOLN: 1.0000 | Freq: Two times a day (BID) | NASAL | 6 refills | Status: AC

## 2022-11-08 LAB — HM DIABETES EYE EXAM

## 2022-11-08 NOTE — Telephone Encounter (Signed)
Pharmacy Patient Advocate Encounter  Received notification from [facility] that Prior Authorization for FreeStyle Libre 3 Sensor  has been DENIED.  Full denial letter will be uploaded to the media tab. See denial reason below.   PA #/Case ID/Reference #: [id number]

## 2022-11-08 NOTE — Telephone Encounter (Signed)
Spoke to pt and informed him that [place] sensor was denied by insurance. Pt stated that he will just stick with what he has now

## 2022-11-12 ENCOUNTER — Encounter: Payer: Self-pay | Admitting: Cardiovascular Disease

## 2022-11-12 NOTE — Telephone Encounter (Signed)
See previous telephone note from 11/08/22. CGM was denied by insurance pt is aware

## 2022-11-16 ENCOUNTER — Other Ambulatory Visit: Payer: Self-pay | Admitting: Cardiovascular Disease

## 2022-11-20 ENCOUNTER — Ambulatory Visit: Payer: Medicare Other | Attending: Cardiovascular Disease

## 2022-11-20 DIAGNOSIS — I255 Ischemic cardiomyopathy: Secondary | ICD-10-CM | POA: Diagnosis not present

## 2022-11-20 LAB — ECHOCARDIOGRAM COMPLETE
AR max vel: 0.85 cm2
AV Area VTI: 0.92 cm2
AV Area mean vel: 0.85 cm2
AV Mean grad: 20 mm[Hg]
AV Peak grad: 34.4 mm[Hg]
Ao pk vel: 2.93 m/s
Area-P 1/2: 2.42 cm2

## 2022-11-22 ENCOUNTER — Ambulatory Visit: Payer: Medicare Other | Attending: Cardiology | Admitting: Cardiology

## 2022-11-22 ENCOUNTER — Encounter: Payer: Self-pay | Admitting: Cardiology

## 2022-11-22 VITALS — BP 102/58 | HR 55 | Ht 72.0 in | Wt 213.1 lb

## 2022-11-22 DIAGNOSIS — I5022 Chronic systolic (congestive) heart failure: Secondary | ICD-10-CM | POA: Diagnosis not present

## 2022-11-22 DIAGNOSIS — I472 Ventricular tachycardia, unspecified: Secondary | ICD-10-CM

## 2022-11-22 DIAGNOSIS — Z95810 Presence of automatic (implantable) cardiac defibrillator: Secondary | ICD-10-CM | POA: Diagnosis not present

## 2022-11-22 DIAGNOSIS — I255 Ischemic cardiomyopathy: Secondary | ICD-10-CM | POA: Diagnosis not present

## 2022-11-22 DIAGNOSIS — R5383 Other fatigue: Secondary | ICD-10-CM

## 2022-11-22 DIAGNOSIS — I25118 Atherosclerotic heart disease of native coronary artery with other forms of angina pectoris: Secondary | ICD-10-CM

## 2022-11-22 LAB — CUP PACEART INCLINIC DEVICE CHECK
Date Time Interrogation Session: 20241108161756
HighPow Impedance: 84 Ohm
Implantable Lead Connection Status: 753985
Implantable Lead Connection Status: 753985
Implantable Lead Connection Status: 753985
Implantable Lead Implant Date: 20220202
Implantable Lead Implant Date: 20230213
Implantable Lead Implant Date: 20230213
Implantable Lead Location: 753858
Implantable Lead Location: 753859
Implantable Lead Location: 753860
Implantable Lead Model: 273
Implantable Lead Model: 4671
Implantable Lead Model: 7841
Implantable Lead Serial Number: 111255
Implantable Lead Serial Number: 1233258
Implantable Lead Serial Number: 861310
Implantable Pulse Generator Implant Date: 20230213
Lead Channel Impedance Value: 392 Ohm
Lead Channel Impedance Value: 556 Ohm
Lead Channel Impedance Value: 578 Ohm
Lead Channel Pacing Threshold Amplitude: 0.8 V
Lead Channel Pacing Threshold Amplitude: 1 V
Lead Channel Pacing Threshold Amplitude: 1 V
Lead Channel Pacing Threshold Pulse Width: 0.4 ms
Lead Channel Pacing Threshold Pulse Width: 0.4 ms
Lead Channel Pacing Threshold Pulse Width: 0.4 ms
Lead Channel Sensing Intrinsic Amplitude: 16.7 mV
Lead Channel Sensing Intrinsic Amplitude: 5.9 mV
Lead Channel Sensing Intrinsic Amplitude: 8.3 mV
Lead Channel Setting Pacing Amplitude: 1.5 V
Lead Channel Setting Pacing Amplitude: 2 V
Lead Channel Setting Pacing Amplitude: 2 V
Lead Channel Setting Pacing Pulse Width: 0.4 ms
Lead Channel Setting Pacing Pulse Width: 0.4 ms
Lead Channel Setting Sensing Sensitivity: 0.6 mV
Lead Channel Setting Sensing Sensitivity: 1 mV
Pulse Gen Serial Number: 279753

## 2022-11-22 NOTE — H&P (View-Only) (Signed)
Cardiology Office Note Date:  11/22/2022  Patient ID:  [name] [name], DOB [birth year], MRN [id number] PCP:  Allegra Grana, [name]  Cardiologist:  Julien Nordmann, MD Electrophysiologist: Lanier Prude, MD   Chief Complaint: 6mon device follow-up  History of Present Illness: [name] [name] is a 82 y.o. male with PMH notable for HFrEF, VT, s/p CRT-D, CAD s/p PCI, LV mural thrombus; seen today for Lanier Prude, MD for routine electrophysiology followup.   Last saw Dr. Lalla Brothers 07/2021. VT quiescent on amiodarone. Having rare phrenic stim with certain positions or if he holds diaphragm a certain way. Had labs from PCP in 01/2022 that had elevated TSH. Due for follow-up thyroid labs now. I saw him 04/2022 where he continued to have phrenic stim with certain positions. He had recently been diagnosed with covid and had not fully recovered.  He recently saw Dr. Mariah Milling 10/2022, c/o labile BP, often in the 90s systolics. His lisinopril was reduced by PCP from 10 > 5mg . Had midodrine but was not taking it. Dr. Mariah Milling encouraged more regular midodrine usage and updated TTE.   On follow-up today, he continue to have phrenic stim almost every night when he lays on his R side.  He requests to review the TTE results. He continues to have fatigue with minimal activity like climbing a set of stairs or running the vacuum. This has slowly worsened over the past few months. He was able to easily do these activities earlier this year without symptoms. Denies chest pain or palpitations, no chest pressure. Continues to have good appetite without abd fullness or bloating.    Device Information: Bos Sci VVI ICD 02/02/2020 RV lead revision 02/2020 Upgrade to CRT-D, imp 02/2021; dx ICM  AAD History: Amiodarone   Past Medical History:  Diagnosis Date   Aortic insufficiency    a. noted on TTE 2015; b. 06/2019 Echo: AI not visualized.   Arthritis    CAD (coronary artery disease)    a. remote PCI in  1991 and 2005; b. MV 3/15: old inferior MI, no ischemia, LVEF 50%, slight inferior wall hypokniesis; c. 06/2019 NSTEMI/PCI: LM nl, LAD 80p/m (Atherectomy & 4.5x18 Resolute Onyx DES), 21m/d, D1 75 (PTCA), RI patent stent, LCX nl, RCA 100p, RPAV fills via L->R collats from LCX.   Chicken pox    Colon polyps    4 pre-cancerous    Diverticulitis    DM type 2 (diabetes mellitus, type 2) (HCC)    Family history of adverse reaction to anesthesia    GERD (gastroesophageal reflux disease)    Heart murmur    History of kidney stones    HOH (hard of hearing)    Hyperlipidemia    Hypertension    Ischemic cardiomyopathy    a. TTE 2015: EF  50-55%, mild global HK; b. 06/2019 Echo: EF 45-50%, Gr1 DD, basal inf AK. Triv MR.   Kidney stones    Melanoma (HCC) 1980   Resected from his back   Mitral regurgitation    a. noted on TTE 2015   Myocardial infarction [facility])     Past Surgical History:  Procedure Laterality Date   BIV UPGRADE N/A 02/26/2021   Procedure: BIV ICD UPGRADE;  Surgeon: Lanier Prude, MD;  Location: [facility] INVASIVE CV LAB;  Service: Cardiovascular;  Laterality: N/A;   CHOLECYSTECTOMY  2012   COLONOSCOPY     in 2003 with polyp removed and leak anastomosis had to have open abdominal surgery    CORONARY ANGIOPLASTY WITH  STENT PLACEMENT  1991 & 2005   CORONARY ATHERECTOMY N/A 07/08/2019   Procedure: CORONARY ATHERECTOMY;  Surgeon: Corky Crafts, MD;  Location: [facility] INVASIVE CV LAB;  Service: Cardiovascular;  Laterality: N/A;   CORONARY BALLOON ANGIOPLASTY N/A 07/08/2019   Procedure: CORONARY BALLOON ANGIOPLASTY;  Surgeon: Corky Crafts, MD;  Location: [facility] INVASIVE CV LAB;  Service: Cardiovascular;  Laterality: N/A;  diagonal    CORONARY PRESSURE/FFR STUDY N/A 07/07/2019   Procedure: INTRAVASCULAR PRESSURE WIRE/FFR STUDY;  Surgeon: Yvonne Kendall, MD;  Location: [facility] INVASIVE CV LAB;  Service: Cardiovascular;  Laterality: N/A;   CORONARY STENT INTERVENTION N/A 07/08/2019    Procedure: CORONARY STENT INTERVENTION;  Surgeon: Corky Crafts, MD;  Location: [facility] INVASIVE CV LAB;  Service: Cardiovascular;  Laterality: N/A;  lad   CORONARY ULTRASOUND/IVUS N/A 07/08/2019   Procedure: Intravascular Ultrasound/IVUS;  Surgeon: Corky Crafts, MD;  Location: [facility] INVASIVE CV LAB;  Service: Cardiovascular;  Laterality: N/A;   CYSTOSCOPY/URETEROSCOPY/HOLMIUM LASER/STENT PLACEMENT Right 01/02/2018   Procedure: CYSTOSCOPY/URETEROSCOPY/HOLMIUM LASER/STENT PLACEMENT;  Surgeon: Sondra Come, MD;  Location: [facility] ORS;  Service: Urology;  Laterality: Right;   ESOPHAGOGASTRODUODENOSCOPY (EGD) WITH PROPOFOL N/A 11/19/2016   Procedure: ESOPHAGOGASTRODUODENOSCOPY (EGD) WITH PROPOFOL;  Surgeon: Midge Minium, MD;  Location: [facility] ENDOSCOPY;  Service: Endoscopy;  Laterality: N/A;   ESOPHAGOGASTRODUODENOSCOPY (EGD) WITH PROPOFOL N/A 02/02/2019   Procedure: ESOPHAGOGASTRODUODENOSCOPY (EGD) WITH PROPOFOL;  Surgeon: Midge Minium, MD;  Location: [facility] ENDOSCOPY;  Service: Endoscopy;  Laterality: N/A;   ICD IMPLANT N/A 02/02/2020   Procedure: ICD IMPLANT;  Surgeon: Lanier Prude, MD;  Location: [facility] INVASIVE CV LAB;  Service: Cardiovascular;  Laterality: N/A;   LEAD REVISION/REPAIR N/A 02/16/2020   Procedure: LEAD REVISION/REPAIR;  Surgeon: Regan Lemming, MD;  Location: [facility] INVASIVE CV LAB;  Service: Cardiovascular;  Laterality: N/A;   LEFT HEART CATH N/A 07/08/2019   Procedure: Left Heart Cath;  Surgeon: Corky Crafts, MD;  Location: [facility] INVASIVE CV LAB;  Service: Cardiovascular;  Laterality: N/A;   LEFT HEART CATH AND CORONARY ANGIOGRAPHY N/A 07/07/2019   Procedure: LEFT HEART CATH AND CORONARY ANGIOGRAPHY;  Surgeon: Yvonne Kendall, MD;  Location: [facility] INVASIVE CV LAB;  Service: Cardiovascular;  Laterality: N/A;   LITHOTRIPSY  2015   MELANOMA EXCISION  1980   malignant   NERVE SURGERY  2015   ulna nerve   pre cancer lesion from scalp  09/2022   TONSILLECTOMY  1945    WISDOM TOOTH EXTRACTION      Current Outpatient Medications  Medication Instructions   amiodarone (PACERONE) 200 mg, Oral, Daily   aspirin EC 81 mg, Oral, Daily, Swallow whole.   azelastine (ASTELIN) 0.1 % nasal spray 1 spray, Each Nare, 2 times daily, Use in each nostril as directed   bisacodyl (DULCOLAX) 5 mg, Oral, Daily PRN   Blood Glucose Monitoring Suppl (ONETOUCH VERIO FLEX SYSTEM) w/Device KIT    Blood Glucose Monitoring Suppl DEVI 1 each, Does not apply, 2 times daily, May substitute to any manufacturer covered by patient's insurance.   carvedilol (COREG) 3.125 MG tablet TAKE 1 TABLET(3.125 MG) BY MOUTH TWICE DAILY   clopidogrel (PLAVIX) 75 MG tablet TAKE 1 TABLET(75 MG) BY MOUTH DAILY.   Continuous Glucose Monitor Sup MISC 1 Units, Does not apply, Daily   Continuous Glucose Receiver DEVI 1 Units, Does not apply, Daily   dextromethorphan-guaiFENesin (MUCINEX DM) 30-600 MG 12hr tablet 1 tablet, Oral, 2 times daily   ezetimibe (ZETIA) 10 MG tablet TAKE 1 TABLET(10 MG) BY MOUTH DAILY  famotidine (PEPCID) 20 mg, Oral, Daily with lunch   Fish Oil 1,000 mg, Oral, Daily with lunch   Fish Oil 500 mg, Oral, Every other day   gabapentin (NEURONTIN) 100 MG capsule TAKE 1 CAPSULE(100 MG) BY MOUTH THREE TIMES DAILY   glipiZIDE (GLUCOTROL) 10 MG tablet TAKE 1/2 TABLET BY MOUTH EVERY MORNING WITH BREAKFAST AND 1 TABLET EVERY EVENING WITH SUPPER.   lisinopril (ZESTRIL) 5 mg, Oral, Daily   midodrine (PROAMATINE) 5 mg, Oral, 3 times daily PRN, Take when BP does drop less than 110 when standing. Additional dose at 2 pm if still dropping below 110 when standing   Multiple Vitamin (MULTIVITAMIN) tablet 1 tablet, Oral, Daily with lunch   ONETOUCH VERIO test strip USE TO CHECK BLOOD SUGAR TWICE DAILY AS DIRECTED   pantoprazole (PROTONIX) 40 mg, Oral, Daily   psyllium (METAMUCIL) 58.6 % powder 1 packet, Oral, Daily at bedtime   rosuvastatin (CRESTOR) 10 MG tablet Take 1 tablet (10 mg) by mouth every  other day    Semaglutide (1 MG/DOSE) 1 mg, Injection, Weekly   sodium chloride (OCEAN) 0.65 % SOLN nasal spray 1 spray, Each Nare, As needed   spironolactone (ALDACTONE) 25 MG tablet TAKE 1 TABLET(25 MG) BY MOUTH DAILY   tamsulosin (FLOMAX) 0.4 MG CAPS capsule TAKE ONE CAPSULE BY MOUTH DAILY   traMADol (ULTRAM) 50 MG tablet TAKE 1 TABLET BY MOUTH EVERY 12 HOURS AS NEEDED    Social History:  The patient  reports that he has been smoking pipe. He has been exposed to tobacco smoke. He quit smokeless tobacco use about 7 years ago. He reports current alcohol use. He reports that he does not use drugs.   Family History:  The patient's family history includes Colon cancer in his father; Diabetes in his father and mother; Heart attack in his mother; Heart disease in his mother; Hyperlipidemia in his mother; Hypertension in his mother; Kidney cancer in his father; Liver cancer in his father; Lung cancer in his father.  ROS:  Please see the history of present illness. All other systems are reviewed and otherwise negative.   PHYSICAL EXAM:  VS:  BP (!) 102/58 (BP Location: Left Arm, Patient Position: Sitting, Cuff Size: Normal)   Pulse (!) 55   Ht 6' (1.829 m)   Wt 213 lb 2 oz (96.7 kg)   SpO2 96%   BMI 28.90 kg/m  BMI: Body mass index is 28.9 kg/m.  GEN- The patient is well appearing, alert and oriented x 3 today.   Lungs- Clear to ausculation bilaterally, normal work of breathing.  Heart- Regular rate and rhythm, no murmurs, rubs or gallops Extremities- No peripheral edema, warm, dry Skin-   device pocket well-healed   Device interrogation done today and reviewed by myself:  Battery 10.5 years Lead thresholds, impedence, sensing stable  Dependent to VVI 40 Ran LV vector guide to confirm no phrenic stim with various positions and appropriate thresholds Adjusted LV pace vector to LV tip1 >> LV ring 3 Turned ON LV auto-threshold with 0.84mV safety margin No episodes No further changes made  today   EKG is ordered. Personal review of EKG from today shows:  EKG Interpretation Date/Time:  Friday November 22 2022 14:24:53 EST Ventricular Rate:  55 PR Interval:  158 QRS Duration:  148 QT Interval:  524 QTC Calculation: 501 R Axis:   268  Text Interpretation: Atrial-sensed ventricular-paced rhythm Biventricular pacemaker detected Confirmed by Sherie Don [phone]) on 11/22/2022 2:29:12 PM  Recent Labs: 02/09/2022: B Natriuretic Peptide 82.8; Hemoglobin 14.1; Platelets 160 04/25/2022: TSH 3.48 10/23/2022: ALT 37; BUN 22; Creatinine, Ser 0.96; Potassium 4.4; Sodium 139  06/19/2022: Cholesterol 118; Direct LDL 64.0; HDL 39.90; Total CHOL/HDL Ratio 3; Triglycerides 212.0; VLDL 42.4   CrCl cannot be calculated (Patient's most recent lab result is older than the maximum 21 days allowed.).   Wt Readings from Last 3 Encounters:  11/22/22 213 lb 2 oz (96.7 kg)  10/25/22 211 lb 2 oz (95.8 kg)  10/23/22 210 lb 9.6 oz (95.5 kg)     Additional studies reviewed include: Previous EP, cardiology notes.   TTE, 11/20/2022  1. Left ventricular ejection fraction, by estimation, is 40 to 45%. Left ventricular ejection fraction by 3D volume is 44 %. The left ventricle has mildly decreased function. The left ventricle demonstrates regional wall motion abnormalities (hypokinesis of the basal to mid inferior/inferolateral wall, seen previously 12/21). Left ventricular diastolic parameters are consistent with Grade I diastolic dysfunction (impaired relaxation). The average left  ventricular global longitudinal strain is -13.5 %.   2. Right ventricular systolic function is normal. The right ventricular size is normal.   3. The mitral valve is normal in structure. No evidence of mitral valve regurgitation. No evidence of mitral stenosis.   4. The aortic valve is normal in structure. There is moderate calcification of the aortic valve. Aortic valve regurgitation is not visualized. Moderate aortic valve  stenosis. Aortic valve area, by VTI measures 0.92 cm. Aortic valve mean gradient measures 20.0 mmHg. Aortic valve Vmax measures 2.93 m/s.   5. The inferior vena cava is normal in size with greater than 50% respiratory variability, suggesting right atrial pressure of 3 mmHg.   TTE, 07/12/2021  1. Left ventricular ejection fraction, by estimation, is 55 to 60%. The left ventricle has normal function. The left ventricle has no regional wall motion abnormalities. Left ventricular diastolic parameters are consistent with Grade I diastolic dysfunction (impaired relaxation).   2. Right ventricular systolic function is normal. The right ventricular size is normal.   3. The mitral valve is normal in structure. No evidence of mitral valve regurgitation.   4. The aortic valve is tricuspid. Aortic valve regurgitation is not visualized. Mild to moderate aortic valve stenosis. Aortic valve mean gradient measures 18.0 mmHg. Aortic valve Vmax measures 2.50 m/s.   5. The inferior vena cava is normal in size with greater than 50% respiratory variability, suggesting right atrial pressure of 3 mmHg.   Comparison(s): 12/21/19-EF 35-40%.   BiV Upgrade, 03/08/2021 1. Venogram of the LUE demonstrating patent left subclavian/axillary venous system. 2. Successful upgrade of a VVI ICD to a CRT-D with the implant of a CS and RA lead 3. No early apparent complications.   TTE, 12/21/2019  1. Left ventricular ejection fraction, by estimation, is 35 to 40%. The left ventricle has moderately decreased function. The left ventricle demonstrates regional wall motion abnormalities (see scoring diagram/findings for description). The left ventricular internal cavity size was mildly dilated. There is mild asymmetric left ventricular hypertrophy of the septal segment. Left ventricular diastolic parameters are indeterminate. There is akinesis of the left ventricular, basal-mid inferior wall and inferolateral wall.   2. Right ventricular  systolic function is normal. The right ventricular size is mildly enlarged.   3. The mitral valve is normal in structure. Trivial mitral valve regurgitation.   4. The aortic valve is tricuspid. There is moderate calcification of the aortic valve. There is moderate thickening of the aortic valve. Aortic  valve regurgitation is not visualized. Mild aortic valve stenosis. Aortic valve mean gradient measures 14.0 mmHg.   5. The inferior vena cava is normal in size with greater than 50% respiratory variability, suggesting right atrial pressure of 3 mmHg.   Conclusion(s)/Recommendation(s): No left ventricular mural or apical thrombus/thrombi.   ASSESSMENT AND PLAN:  #) HFmrEF #) CAD s/p PCI Most recent TTE with reduced LVEF Given history of CAD with prior stenting, recommend ischemic eval R/LHC to further eval Pre-procedure labs today No changes to medications at this time Consider reducing spiro, adjusting BB to metop   #) VT #) CDT-D in situ Device functioning well, see paceart for details Dependent No VT episodes Tolerating amiodarone Recent LFTs stable Update thyroid labs     Current medicines are reviewed at length with the patient today.   The patient does not have concerns regarding his medicines.  The following changes were made today:  none  Labs/ tests ordered today include:  Orders Placed This Encounter  Procedures   CBC   Basic metabolic panel   TSH   T4, free   EKG 12-Lead     Disposition: Follow up with Dr. Lalla Brothers or EP APP in 6 months Follow up with gen cards APP 2-3 weeks after [facility]   Signed, Sherie Don, NP  11/22/22  3:55 PM  Electrophysiology [facility]

## 2022-11-22 NOTE — Patient Instructions (Signed)
Medication Instructions:   The current medical regimen is effective;  continue present plan and medications.  *If you need a refill on your cardiac medications before your next appointment, please call your pharmacy*   Lab Work: Your provider would like for you to have following labs drawn today CBC, BMET, TSH, T4.   If you have labs (blood work) drawn today and your tests are completely normal, you will receive your results only by: MyChart Message (if you have MyChart) OR A paper copy in the mail If you have any lab test that is abnormal or we need to change your treatment, we will call you to review the results.   Testing/Procedures:  Your physician has requested that you have a cardiac catheterization. Cardiac catheterization is used to diagnose and/or treat various heart conditions. Doctors may recommend this procedure for a number of different reasons. The most common reason is to evaluate chest pain. Chest pain can be a symptom of coronary artery disease (CAD), and cardiac catheterization can show whether plaque is narrowing or blocking your heart's arteries. This procedure is also used to evaluate the valves, as well as measure the blood flow and oxygen levels in different parts of your heart. For further information please visit [email or web address] Please follow instruction sheet, as given.    Follow-Up: At [facility], you and your health needs are our priority.  As part of our continuing mission to provide you with exceptional heart care, we have created designated Provider Care Teams.  These Care Teams include your primary Cardiologist (physician) and Advanced Practice Providers (APPs -  Physician Assistants and Nurse Practitioners) who all work together to provide you with the care you need, when you need it.  We recommend signing up for the patient portal called "MyChart".  Sign up information is provided on this After Visit Summary.  MyChart is used to connect with  patients for Virtual Visits (Telemedicine).  Patients are able to view lab/test results, encounter notes, upcoming appointments, etc.  Non-urgent messages can be sent to your provider as well.   To learn more about what you can do with MyChart, go to [email or web address].    Your next appointment:   2 week(s) post cath  Provider:   You may see Julien Nordmann, MD or one of the following Advanced Practice Providers on your designated Care Team:   Nicolasa Ducking, NP Eula Listen, PA-C Cadence Fransico Michael, PA-C Charlsie Quest, NP Carlos Levering, NP   Back to see Sherie Don, NP in 3 months  Other Instructions  [facility] A DEPT OF [place]. [facility] AT [facility] [address] [place] [address] Dept: [phone] Loc: [phone]  Dawon Sokolowski  11/22/2022  You are scheduled for a Cardiac Catheterization on Tuesday, November 12 with Dr. Cristal Deer End.  1. Please arrive at the [facility] Entrance of [facility], [address], [place] [address] at 8:30 AM (This is 1 hour(s) prior to your procedure time).  Proceed to the Check-In Desk directly inside the entrance.  Procedure Parking: Use the entrance off of the [address] side of the hospital. Turn right upon entering and follow the driveway to parking that is directly in front of the [facility] Vascular Center. There is no valet parking available at this entrance, however there is an awning directly in front of the Heart & Vascular Center for drop off/ pick up for patients.  Special note: Every effort is  made to have your procedure done on time. Please understand that emergencies sometimes delay scheduled procedures.  2. Diet: Do not eat solid foods after midnight.  The patient may have clear liquids until 5am upon the day of the procedure.  3. Labs: You will need to have blood drawn today- CBC, BMET, TSH, T4. You do not need to be  fasting.  4. Medication instructions in preparation for your procedure:   Contrast Allergy: No  DO NOT TAKE YOUR DOSE OF OZEMPIC ON SUNDAY-  On the morning of your procedure, take your Plavix/Clopidogrel and any morning medicines NOT listed above.  You may use sips of water.  5. Plan to go home the same day, you will only stay overnight if medically necessary. 6. Bring a current list of your medications and current insurance cards. 7. You MUST have a responsible person to drive you home. 8. Someone MUST be with you the first 24 hours after you arrive home or your discharge will be delayed. 9. Please wear clothes that are easy to get on and off and wear slip-on shoes.  Thank you for allowing [place] to care for you!   -- [place] [facility]

## 2022-11-22 NOTE — Progress Notes (Signed)
Cardiology Office Note Date:  11/22/2022  Patient ID:  [name] [name], DOB [birth year], MRN [id number] PCP:  Allegra Grana, [name]  Cardiologist:  Julien Nordmann, MD Electrophysiologist: Lanier Prude, MD   Chief Complaint: 6mon device follow-up  History of Present Illness: [name] [name] is a 82 y.o. male with PMH notable for HFrEF, VT, s/p CRT-D, CAD s/p PCI, LV mural thrombus; seen today for Lanier Prude, MD for routine electrophysiology followup.   Last saw Dr. Lalla Brothers 07/2021. VT quiescent on amiodarone. Having rare phrenic stim with certain positions or if he holds diaphragm a certain way. Had labs from PCP in 01/2022 that had elevated TSH. Due for follow-up thyroid labs now. I saw him 04/2022 where he continued to have phrenic stim with certain positions. He had recently been diagnosed with covid and had not fully recovered.  He recently saw Dr. Mariah Milling 10/2022, c/o labile BP, often in the 90s systolics. His lisinopril was reduced by PCP from 10 > 5mg . Had midodrine but was not taking it. Dr. Mariah Milling encouraged more regular midodrine usage and updated TTE.   On follow-up today, he continue to have phrenic stim almost every night when he lays on his R side.  He requests to review the TTE results. He continues to have fatigue with minimal activity like climbing a set of stairs or running the vacuum. This has slowly worsened over the past few months. He was able to easily do these activities earlier this year without symptoms. Denies chest pain or palpitations, no chest pressure. Continues to have good appetite without abd fullness or bloating.    Device Information: Bos Sci VVI ICD 02/02/2020 RV lead revision 02/2020 Upgrade to CRT-D, imp 02/2021; dx ICM  AAD History: Amiodarone   Past Medical History:  Diagnosis Date   Aortic insufficiency    a. noted on TTE 2015; b. 06/2019 Echo: AI not visualized.   Arthritis    CAD (coronary artery disease)    a. remote PCI in  1991 and 2005; b. MV 3/15: old inferior MI, no ischemia, LVEF 50%, slight inferior wall hypokniesis; c. 06/2019 NSTEMI/PCI: LM nl, LAD 80p/m (Atherectomy & 4.5x18 Resolute Onyx DES), 21m/d, D1 75 (PTCA), RI patent stent, LCX nl, RCA 100p, RPAV fills via L->R collats from LCX.   Chicken pox    Colon polyps    4 pre-cancerous    Diverticulitis    DM type 2 (diabetes mellitus, type 2) (HCC)    Family history of adverse reaction to anesthesia    GERD (gastroesophageal reflux disease)    Heart murmur    History of kidney stones    HOH (hard of hearing)    Hyperlipidemia    Hypertension    Ischemic cardiomyopathy    a. TTE 2015: EF  50-55%, mild global HK; b. 06/2019 Echo: EF 45-50%, Gr1 DD, basal inf AK. Triv MR.   Kidney stones    Melanoma (HCC) 1980   Resected from his back   Mitral regurgitation    a. noted on TTE 2015   Myocardial infarction [facility])     Past Surgical History:  Procedure Laterality Date   BIV UPGRADE N/A 02/26/2021   Procedure: BIV ICD UPGRADE;  Surgeon: Lanier Prude, MD;  Location: [facility] INVASIVE CV LAB;  Service: Cardiovascular;  Laterality: N/A;   CHOLECYSTECTOMY  2012   COLONOSCOPY     in 2003 with polyp removed and leak anastomosis had to have open abdominal surgery    CORONARY ANGIOPLASTY WITH  STENT PLACEMENT  1991 & 2005   CORONARY ATHERECTOMY N/A 07/08/2019   Procedure: CORONARY ATHERECTOMY;  Surgeon: Corky Crafts, MD;  Location: [facility] INVASIVE CV LAB;  Service: Cardiovascular;  Laterality: N/A;   CORONARY BALLOON ANGIOPLASTY N/A 07/08/2019   Procedure: CORONARY BALLOON ANGIOPLASTY;  Surgeon: Corky Crafts, MD;  Location: [facility] INVASIVE CV LAB;  Service: Cardiovascular;  Laterality: N/A;  diagonal    CORONARY PRESSURE/FFR STUDY N/A 07/07/2019   Procedure: INTRAVASCULAR PRESSURE WIRE/FFR STUDY;  Surgeon: Yvonne Kendall, MD;  Location: [facility] INVASIVE CV LAB;  Service: Cardiovascular;  Laterality: N/A;   CORONARY STENT INTERVENTION N/A 07/08/2019    Procedure: CORONARY STENT INTERVENTION;  Surgeon: Corky Crafts, MD;  Location: [facility] INVASIVE CV LAB;  Service: Cardiovascular;  Laterality: N/A;  lad   CORONARY ULTRASOUND/IVUS N/A 07/08/2019   Procedure: Intravascular Ultrasound/IVUS;  Surgeon: Corky Crafts, MD;  Location: [facility] INVASIVE CV LAB;  Service: Cardiovascular;  Laterality: N/A;   CYSTOSCOPY/URETEROSCOPY/HOLMIUM LASER/STENT PLACEMENT Right 01/02/2018   Procedure: CYSTOSCOPY/URETEROSCOPY/HOLMIUM LASER/STENT PLACEMENT;  Surgeon: Sondra Come, MD;  Location: [facility] ORS;  Service: Urology;  Laterality: Right;   ESOPHAGOGASTRODUODENOSCOPY (EGD) WITH PROPOFOL N/A 11/19/2016   Procedure: ESOPHAGOGASTRODUODENOSCOPY (EGD) WITH PROPOFOL;  Surgeon: Midge Minium, MD;  Location: [facility] ENDOSCOPY;  Service: Endoscopy;  Laterality: N/A;   ESOPHAGOGASTRODUODENOSCOPY (EGD) WITH PROPOFOL N/A 02/02/2019   Procedure: ESOPHAGOGASTRODUODENOSCOPY (EGD) WITH PROPOFOL;  Surgeon: Midge Minium, MD;  Location: [facility] ENDOSCOPY;  Service: Endoscopy;  Laterality: N/A;   ICD IMPLANT N/A 02/02/2020   Procedure: ICD IMPLANT;  Surgeon: Lanier Prude, MD;  Location: [facility] INVASIVE CV LAB;  Service: Cardiovascular;  Laterality: N/A;   LEAD REVISION/REPAIR N/A 02/16/2020   Procedure: LEAD REVISION/REPAIR;  Surgeon: Regan Lemming, MD;  Location: [facility] INVASIVE CV LAB;  Service: Cardiovascular;  Laterality: N/A;   LEFT HEART CATH N/A 07/08/2019   Procedure: Left Heart Cath;  Surgeon: Corky Crafts, MD;  Location: [facility] INVASIVE CV LAB;  Service: Cardiovascular;  Laterality: N/A;   LEFT HEART CATH AND CORONARY ANGIOGRAPHY N/A 07/07/2019   Procedure: LEFT HEART CATH AND CORONARY ANGIOGRAPHY;  Surgeon: Yvonne Kendall, MD;  Location: [facility] INVASIVE CV LAB;  Service: Cardiovascular;  Laterality: N/A;   LITHOTRIPSY  2015   MELANOMA EXCISION  1980   malignant   NERVE SURGERY  2015   ulna nerve   pre cancer lesion from scalp  09/2022   TONSILLECTOMY  1945    WISDOM TOOTH EXTRACTION      Current Outpatient Medications  Medication Instructions   amiodarone (PACERONE) 200 mg, Oral, Daily   aspirin EC 81 mg, Oral, Daily, Swallow whole.   azelastine (ASTELIN) 0.1 % nasal spray 1 spray, Each Nare, 2 times daily, Use in each nostril as directed   bisacodyl (DULCOLAX) 5 mg, Oral, Daily PRN   Blood Glucose Monitoring Suppl (ONETOUCH VERIO FLEX SYSTEM) w/Device KIT    Blood Glucose Monitoring Suppl DEVI 1 each, Does not apply, 2 times daily, May substitute to any manufacturer covered by patient's insurance.   carvedilol (COREG) 3.125 MG tablet TAKE 1 TABLET(3.125 MG) BY MOUTH TWICE DAILY   clopidogrel (PLAVIX) 75 MG tablet TAKE 1 TABLET(75 MG) BY MOUTH DAILY.   Continuous Glucose Monitor Sup MISC 1 Units, Does not apply, Daily   Continuous Glucose Receiver DEVI 1 Units, Does not apply, Daily   dextromethorphan-guaiFENesin (MUCINEX DM) 30-600 MG 12hr tablet 1 tablet, Oral, 2 times daily   ezetimibe (ZETIA) 10 MG tablet TAKE 1 TABLET(10 MG) BY MOUTH DAILY  famotidine (PEPCID) 20 mg, Oral, Daily with lunch   Fish Oil 1,000 mg, Oral, Daily with lunch   Fish Oil 500 mg, Oral, Every other day   gabapentin (NEURONTIN) 100 MG capsule TAKE 1 CAPSULE(100 MG) BY MOUTH THREE TIMES DAILY   glipiZIDE (GLUCOTROL) 10 MG tablet TAKE 1/2 TABLET BY MOUTH EVERY MORNING WITH BREAKFAST AND 1 TABLET EVERY EVENING WITH SUPPER.   lisinopril (ZESTRIL) 5 mg, Oral, Daily   midodrine (PROAMATINE) 5 mg, Oral, 3 times daily PRN, Take when BP does drop less than 110 when standing. Additional dose at 2 pm if still dropping below 110 when standing   Multiple Vitamin (MULTIVITAMIN) tablet 1 tablet, Oral, Daily with lunch   ONETOUCH VERIO test strip USE TO CHECK BLOOD SUGAR TWICE DAILY AS DIRECTED   pantoprazole (PROTONIX) 40 mg, Oral, Daily   psyllium (METAMUCIL) 58.6 % powder 1 packet, Oral, Daily at bedtime   rosuvastatin (CRESTOR) 10 MG tablet Take 1 tablet (10 mg) by mouth every  other day    Semaglutide (1 MG/DOSE) 1 mg, Injection, Weekly   sodium chloride (OCEAN) 0.65 % SOLN nasal spray 1 spray, Each Nare, As needed   spironolactone (ALDACTONE) 25 MG tablet TAKE 1 TABLET(25 MG) BY MOUTH DAILY   tamsulosin (FLOMAX) 0.4 MG CAPS capsule TAKE ONE CAPSULE BY MOUTH DAILY   traMADol (ULTRAM) 50 MG tablet TAKE 1 TABLET BY MOUTH EVERY 12 HOURS AS NEEDED    Social History:  The patient  reports that he has been smoking pipe. He has been exposed to tobacco smoke. He quit smokeless tobacco use about 7 years ago. He reports current alcohol use. He reports that he does not use drugs.   Family History:  The patient's family history includes Colon cancer in his father; Diabetes in his father and mother; Heart attack in his mother; Heart disease in his mother; Hyperlipidemia in his mother; Hypertension in his mother; Kidney cancer in his father; Liver cancer in his father; Lung cancer in his father.  ROS:  Please see the history of present illness. All other systems are reviewed and otherwise negative.   PHYSICAL EXAM:  VS:  BP (!) 102/58 (BP Location: Left Arm, Patient Position: Sitting, Cuff Size: Normal)   Pulse (!) 55   Ht 6' (1.829 m)   Wt 213 lb 2 oz (96.7 kg)   SpO2 96%   BMI 28.90 kg/m  BMI: Body mass index is 28.9 kg/m.  GEN- The patient is well appearing, alert and oriented x 3 today.   Lungs- Clear to ausculation bilaterally, normal work of breathing.  Heart- Regular rate and rhythm, no murmurs, rubs or gallops Extremities- No peripheral edema, warm, dry Skin-   device pocket well-healed   Device interrogation done today and reviewed by myself:  Battery 10.5 years Lead thresholds, impedence, sensing stable  Dependent to VVI 40 Ran LV vector guide to confirm no phrenic stim with various positions and appropriate thresholds Adjusted LV pace vector to LV tip1 >> LV ring 3 Turned ON LV auto-threshold with 0.84mV safety margin No episodes No further changes made  today   EKG is ordered. Personal review of EKG from today shows:  EKG Interpretation Date/Time:  Friday November 22 2022 14:24:53 EST Ventricular Rate:  55 PR Interval:  158 QRS Duration:  148 QT Interval:  524 QTC Calculation: 501 R Axis:   268  Text Interpretation: Atrial-sensed ventricular-paced rhythm Biventricular pacemaker detected Confirmed by Sherie Don [phone]) on 11/22/2022 2:29:12 PM  Recent Labs: 02/09/2022: B Natriuretic Peptide 82.8; Hemoglobin 14.1; Platelets 160 04/25/2022: TSH 3.48 10/23/2022: ALT 37; BUN 22; Creatinine, Ser 0.96; Potassium 4.4; Sodium 139  06/19/2022: Cholesterol 118; Direct LDL 64.0; HDL 39.90; Total CHOL/HDL Ratio 3; Triglycerides 212.0; VLDL 42.4   CrCl cannot be calculated (Patient's most recent lab result is older than the maximum 21 days allowed.).   Wt Readings from Last 3 Encounters:  11/22/22 213 lb 2 oz (96.7 kg)  10/25/22 211 lb 2 oz (95.8 kg)  10/23/22 210 lb 9.6 oz (95.5 kg)     Additional studies reviewed include: Previous EP, cardiology notes.   TTE, 11/20/2022  1. Left ventricular ejection fraction, by estimation, is 40 to 45%. Left ventricular ejection fraction by 3D volume is 44 %. The left ventricle has mildly decreased function. The left ventricle demonstrates regional wall motion abnormalities (hypokinesis of the basal to mid inferior/inferolateral wall, seen previously 12/21). Left ventricular diastolic parameters are consistent with Grade I diastolic dysfunction (impaired relaxation). The average left  ventricular global longitudinal strain is -13.5 %.   2. Right ventricular systolic function is normal. The right ventricular size is normal.   3. The mitral valve is normal in structure. No evidence of mitral valve regurgitation. No evidence of mitral stenosis.   4. The aortic valve is normal in structure. There is moderate calcification of the aortic valve. Aortic valve regurgitation is not visualized. Moderate aortic valve  stenosis. Aortic valve area, by VTI measures 0.92 cm. Aortic valve mean gradient measures 20.0 mmHg. Aortic valve Vmax measures 2.93 m/s.   5. The inferior vena cava is normal in size with greater than 50% respiratory variability, suggesting right atrial pressure of 3 mmHg.   TTE, 07/12/2021  1. Left ventricular ejection fraction, by estimation, is 55 to 60%. The left ventricle has normal function. The left ventricle has no regional wall motion abnormalities. Left ventricular diastolic parameters are consistent with Grade I diastolic dysfunction (impaired relaxation).   2. Right ventricular systolic function is normal. The right ventricular size is normal.   3. The mitral valve is normal in structure. No evidence of mitral valve regurgitation.   4. The aortic valve is tricuspid. Aortic valve regurgitation is not visualized. Mild to moderate aortic valve stenosis. Aortic valve mean gradient measures 18.0 mmHg. Aortic valve Vmax measures 2.50 m/s.   5. The inferior vena cava is normal in size with greater than 50% respiratory variability, suggesting right atrial pressure of 3 mmHg.   Comparison(s): 12/21/19-EF 35-40%.   BiV Upgrade, 03/08/2021 1. Venogram of the LUE demonstrating patent left subclavian/axillary venous system. 2. Successful upgrade of a VVI ICD to a CRT-D with the implant of a CS and RA lead 3. No early apparent complications.   TTE, 12/21/2019  1. Left ventricular ejection fraction, by estimation, is 35 to 40%. The left ventricle has moderately decreased function. The left ventricle demonstrates regional wall motion abnormalities (see scoring diagram/findings for description). The left ventricular internal cavity size was mildly dilated. There is mild asymmetric left ventricular hypertrophy of the septal segment. Left ventricular diastolic parameters are indeterminate. There is akinesis of the left ventricular, basal-mid inferior wall and inferolateral wall.   2. Right ventricular  systolic function is normal. The right ventricular size is mildly enlarged.   3. The mitral valve is normal in structure. Trivial mitral valve regurgitation.   4. The aortic valve is tricuspid. There is moderate calcification of the aortic valve. There is moderate thickening of the aortic valve. Aortic  valve regurgitation is not visualized. Mild aortic valve stenosis. Aortic valve mean gradient measures 14.0 mmHg.   5. The inferior vena cava is normal in size with greater than 50% respiratory variability, suggesting right atrial pressure of 3 mmHg.   Conclusion(s)/Recommendation(s): No left ventricular mural or apical thrombus/thrombi.   ASSESSMENT AND PLAN:  #) HFmrEF #) CAD s/p PCI Most recent TTE with reduced LVEF Given history of CAD with prior stenting, recommend ischemic eval R/LHC to further eval Pre-procedure labs today No changes to medications at this time Consider reducing spiro, adjusting BB to metop   #) VT #) CDT-D in situ Device functioning well, see paceart for details Dependent No VT episodes Tolerating amiodarone Recent LFTs stable Update thyroid labs     Current medicines are reviewed at length with the patient today.   The patient does not have concerns regarding his medicines.  The following changes were made today:  none  Labs/ tests ordered today include:  Orders Placed This Encounter  Procedures   CBC   Basic metabolic panel   TSH   T4, free   EKG 12-Lead     Disposition: Follow up with Dr. Lalla Brothers or EP APP in 6 months Follow up with gen cards APP 2-3 weeks after [facility]   Signed, Sherie Don, NP  11/22/22  3:55 PM  Electrophysiology [facility]

## 2022-11-23 LAB — CBC
Hematocrit: 40.5 % (ref 37.5–51.0)
Hemoglobin: 13.5 g/dL (ref 13.0–17.7)
MCH: 32.3 pg (ref 26.6–33.0)
MCHC: 33.3 g/dL (ref 31.5–35.7)
MCV: 97 fL (ref 79–97)
Platelets: 163 10*3/uL (ref 150–450)
RBC: 4.18 x10E6/uL (ref 4.14–5.80)
RDW: 13.4 % (ref 11.6–15.4)
WBC: 5.7 10*3/uL (ref 3.4–10.8)

## 2022-11-23 LAB — T4, FREE: Free T4: 1.19 ng/dL (ref 0.82–1.77)

## 2022-11-23 LAB — BASIC METABOLIC PANEL
BUN/Creatinine Ratio: 21 (ref 10–24)
BUN: 22 mg/dL (ref 8–27)
CO2: 20 mmol/L (ref 20–29)
Calcium: 9.2 mg/dL (ref 8.6–10.2)
Chloride: 105 mmol/L (ref 96–106)
Creatinine, Ser: 1.04 mg/dL (ref 0.76–1.27)
Glucose: 162 mg/dL — ABNORMAL HIGH (ref 70–99)
Potassium: 4.6 mmol/L (ref 3.5–5.2)
Sodium: 143 mmol/L (ref 134–144)
eGFR: 72 mL/min/{1.73_m2} (ref >59)

## 2022-11-23 LAB — TSH: TSH: 4.79 u[IU]/mL — ABNORMAL HIGH (ref 0.450–4.500)

## 2022-11-26 ENCOUNTER — Other Ambulatory Visit: Payer: Self-pay

## 2022-11-26 ENCOUNTER — Ambulatory Visit
Admission: RE | Admit: 2022-11-26 | Discharge: 2022-11-26 | Disposition: A | Discharge status: Home / Self Care | Payer: Medicare Other | Attending: Internal Medicine | Admitting: Internal Medicine

## 2022-11-26 ENCOUNTER — Ambulatory Visit (INDEPENDENT_AMBULATORY_CARE_PROVIDER_SITE_OTHER): Payer: Medicare Other

## 2022-11-26 ENCOUNTER — Encounter
Admission: RE | Disposition: A | Discharge status: Home / Self Care | Payer: Self-pay | Source: Home / Self Care | Attending: Internal Medicine

## 2022-11-26 DIAGNOSIS — Z79899 Other long term (current) drug therapy: Secondary | ICD-10-CM | POA: Insufficient documentation

## 2022-11-26 DIAGNOSIS — Z95810 Presence of automatic (implantable) cardiac defibrillator: Secondary | ICD-10-CM | POA: Insufficient documentation

## 2022-11-26 DIAGNOSIS — Z87891 Personal history of nicotine dependence: Secondary | ICD-10-CM | POA: Insufficient documentation

## 2022-11-26 DIAGNOSIS — I2584 Coronary atherosclerosis due to calcified coronary lesion: Secondary | ICD-10-CM | POA: Insufficient documentation

## 2022-11-26 DIAGNOSIS — I350 Nonrheumatic aortic (valve) stenosis: Secondary | ICD-10-CM | POA: Insufficient documentation

## 2022-11-26 DIAGNOSIS — I5022 Chronic systolic (congestive) heart failure: Secondary | ICD-10-CM

## 2022-11-26 DIAGNOSIS — Z955 Presence of coronary angioplasty implant and graft: Secondary | ICD-10-CM | POA: Diagnosis not present

## 2022-11-26 DIAGNOSIS — I2510 Atherosclerotic heart disease of native coronary artery without angina pectoris: Secondary | ICD-10-CM | POA: Diagnosis present

## 2022-11-26 DIAGNOSIS — I255 Ischemic cardiomyopathy: Secondary | ICD-10-CM

## 2022-11-26 DIAGNOSIS — I110 Hypertensive heart disease with heart failure: Secondary | ICD-10-CM | POA: Insufficient documentation

## 2022-11-26 DIAGNOSIS — I2582 Chronic total occlusion of coronary artery: Secondary | ICD-10-CM | POA: Diagnosis not present

## 2022-11-26 HISTORY — PX: RIGHT HEART CATH AND CORONARY ANGIOGRAPHY: CATH118264

## 2022-11-26 LAB — POCT I-STAT 7, (LYTES, BLD GAS, ICA,H+H)
Acid-base deficit: 2 mmol/L (ref 0.0–2.0)
Bicarbonate: 22.3 mmol/L (ref 20.0–28.0)
Calcium, Ion: 1.19 mmol/L (ref 1.15–1.40)
HCT: 38 % — ABNORMAL LOW (ref 39.0–52.0)
Hemoglobin: 12.9 g/dL — ABNORMAL LOW (ref 13.0–17.0)
O2 Saturation: 96 %
Potassium: 4 mmol/L (ref 3.5–5.1)
Sodium: 139 mmol/L (ref 135–145)
TCO2: 23 mmol/L (ref 22–32)
pCO2 arterial: 34.8 mm[Hg] (ref 32–48)
pH, Arterial: 7.414 (ref 7.35–7.45)
pO2, Arterial: 77 mm[Hg] — ABNORMAL LOW (ref 83–108)

## 2022-11-26 LAB — POCT I-STAT EG7
Acid-base deficit: 1 mmol/L (ref 0.0–2.0)
Bicarbonate: 24.1 mmol/L (ref 20.0–28.0)
Calcium, Ion: 1.2 mmol/L (ref 1.15–1.40)
HCT: 39 % (ref 39.0–52.0)
Hemoglobin: 13.3 g/dL (ref 13.0–17.0)
O2 Saturation: 62 %
Potassium: 4.2 mmol/L (ref 3.5–5.1)
Sodium: 139 mmol/L (ref 135–145)
TCO2: 25 mmol/L (ref 22–32)
pCO2, Ven: 42.2 mm[Hg] — ABNORMAL LOW (ref 44–60)
pH, Ven: 7.364 (ref 7.25–7.43)
pO2, Ven: 34 mm[Hg] (ref 32–45)

## 2022-11-26 LAB — GLUCOSE, CAPILLARY: Glucose-Capillary: 146 mg/dL — ABNORMAL HIGH (ref 70–99)

## 2022-11-26 SURGERY — RIGHT HEART CATH AND CORONARY ANGIOGRAPHY
Anesthesia: Moderate Sedation | Laterality: Bilateral

## 2022-11-26 MED ORDER — ASPIRIN 81 MG PO CHEW: 81.0000 mg | CHEWABLE_TABLET | ORAL | Status: DC

## 2022-11-26 MED ORDER — SODIUM CHLORIDE 0.9% FLUSH: 3.0000 mL | INTRAVENOUS | Status: DC | PRN

## 2022-11-26 MED ORDER — FENTANYL CITRATE (PF) 100 MCG/2ML IJ SOLN
INTRAMUSCULAR | Status: AC
  Filled 2022-11-26: qty 2

## 2022-11-26 MED ORDER — HEPARIN SODIUM (PORCINE) 1000 UNIT/ML IJ SOLN
INTRAMUSCULAR | Status: DC | PRN
  Administered 2022-11-26: 5000 [IU] via INTRAVENOUS

## 2022-11-26 MED ORDER — SODIUM CHLORIDE 0.9 % IV SOLN: 250.0000 mL | INTRAVENOUS | Status: DC | PRN

## 2022-11-26 MED ORDER — SODIUM CHLORIDE 0.9% FLUSH: 3.0000 mL | Freq: Two times a day (BID) | INTRAVENOUS | Status: DC

## 2022-11-26 MED ORDER — MIDAZOLAM HCL 2 MG/2ML IJ SOLN
INTRAMUSCULAR | Status: DC | PRN
  Administered 2022-11-26: 1 mg via INTRAVENOUS

## 2022-11-26 MED ORDER — VERAPAMIL HCL 2.5 MG/ML IV SOLN
INTRAVENOUS | Status: AC
  Filled 2022-11-26: qty 2

## 2022-11-26 MED ORDER — LABETALOL HCL 5 MG/ML IV SOLN: 10.0000 mg | INTRAVENOUS | Status: DC | PRN

## 2022-11-26 MED ORDER — HYDRALAZINE HCL 20 MG/ML IJ SOLN: 10.0000 mg | INTRAMUSCULAR | Status: DC | PRN

## 2022-11-26 MED ORDER — HEPARIN (PORCINE) IN NACL 2000-0.9 UNIT/L-% IV SOLN
INTRAVENOUS | Status: DC | PRN
  Administered 2022-11-26: 1000 mL

## 2022-11-26 MED ORDER — SODIUM CHLORIDE 0.9 % IV SOLN: INTRAVENOUS | Status: DC

## 2022-11-26 MED ORDER — HEPARIN SODIUM (PORCINE) 1000 UNIT/ML IJ SOLN
INTRAMUSCULAR | Status: AC
  Filled 2022-11-26: qty 10

## 2022-11-26 MED ORDER — ACETAMINOPHEN 325 MG PO TABS: 650.0000 mg | ORAL_TABLET | ORAL | Status: DC | PRN

## 2022-11-26 MED ORDER — VERAPAMIL HCL 2.5 MG/ML IV SOLN
INTRAVENOUS | Status: DC | PRN
  Administered 2022-11-26 (×2): 2.5 mg via INTRAVENOUS

## 2022-11-26 MED ORDER — FENTANYL CITRATE (PF) 100 MCG/2ML IJ SOLN
INTRAMUSCULAR | Status: DC | PRN
  Administered 2022-11-26: 25 ug via INTRAVENOUS

## 2022-11-26 MED ORDER — IOHEXOL 300 MG/ML  SOLN
INTRAMUSCULAR | Status: DC | PRN
  Administered 2022-11-26: 20 mL

## 2022-11-26 MED ORDER — HEPARIN (PORCINE) IN NACL 1000-0.9 UT/500ML-% IV SOLN
INTRAVENOUS | Status: AC
  Filled 2022-11-26: qty 1000

## 2022-11-26 MED ORDER — MIDAZOLAM HCL 2 MG/2ML IJ SOLN
INTRAMUSCULAR | Status: AC
Start: 2022-11-26 — End: ?
  Filled 2022-11-26: qty 2

## 2022-11-26 MED ORDER — ONDANSETRON HCL 4 MG/2ML IJ SOLN: 4.0000 mg | Freq: Four times a day (QID) | INTRAMUSCULAR | Status: DC | PRN

## 2022-11-26 SURGICAL SUPPLY — 12 items
CATH 5F 110X4 TIG (CATHETERS) IMPLANT
CATH BALLN WEDGE 5F 110CM (CATHETERS) IMPLANT
DEVICE RAD TR BAND REGULAR (VASCULAR PRODUCTS) IMPLANT
DRAPE BRACHIAL (DRAPES) IMPLANT
GLIDESHEATH SLEND SS 6F .021 (SHEATH) IMPLANT
GUIDEWIRE INQWIRE 1.5J.035X260 (WIRE) IMPLANT
INQWIRE 1.5J .035X260CM (WIRE) ×1
PACK CARDIAC CATH (CUSTOM PROCEDURE TRAY) ×1 IMPLANT
PROTECTION STATION PRESSURIZED (MISCELLANEOUS) ×1
SET ATX-X65L (MISCELLANEOUS) IMPLANT
SHEATH GLIDE SLENDER 4/5FR (SHEATH) IMPLANT
STATION PROTECTION PRESSURIZED (MISCELLANEOUS) IMPLANT

## 2022-11-26 NOTE — Interval H&P Note (Signed)
History and Physical Interval Note:  11/26/2022 11:41 AM  [name] [name]  has presented today for surgery, with the diagnosis of heart failure with mildly reduced ejection fraction and coronary artery disease.  The various methods of treatment have been discussed with the patient and family. After consideration of risks, benefits and other options for treatment, the patient has consented to  Procedure(s): RIGHT/LEFT HEART CATH AND CORONARY ANGIOGRAPHY (Bilateral) as a surgical intervention.  The patient's history has been reviewed, patient examined, no change in status, stable for surgery.  I have reviewed the patient's chart and labs.  Questions were answered to the patient's satisfaction.    Cath Lab Visit (complete for each Cath Lab visit)  Clinical Evaluation Leading to the Procedure:   ACS: No.  Non-ACS:    Anginal Classification: CCS I  Anti-ischemic medical therapy: Minimal Therapy (1 class of medications)  Non-Invasive Test Results: No non-invasive testing performed (LVEF 40-45% by echo -> intermediate risk)  Prior CABG: No previous CABG  [name] [name]

## 2022-11-27 ENCOUNTER — Encounter: Payer: Self-pay | Admitting: Internal Medicine

## 2022-11-28 LAB — CUP PACEART REMOTE DEVICE CHECK
Battery Remaining Longevity: 114 mo
Battery Remaining Percentage: 100 %
Brady Statistic RA Percent Paced: 8 %
Brady Statistic RV Percent Paced: 100 %
Date Time Interrogation Session: 20241112002100
HighPow Impedance: 78 Ohm
Implantable Lead Connection Status: 753985
Implantable Lead Connection Status: 753985
Implantable Lead Connection Status: 753985
Implantable Lead Implant Date: 20220202
Implantable Lead Implant Date: 20230213
Implantable Lead Implant Date: 20230213
Implantable Lead Location: 753858
Implantable Lead Location: 753859
Implantable Lead Location: 753860
Implantable Lead Model: 273
Implantable Lead Model: 4671
Implantable Lead Model: 7841
Implantable Lead Serial Number: 111255
Implantable Lead Serial Number: 1233258
Implantable Lead Serial Number: 861310
Implantable Pulse Generator Implant Date: 20230213
Lead Channel Impedance Value: 395 Ohm
Lead Channel Impedance Value: 590 Ohm
Lead Channel Impedance Value: 831 Ohm
Lead Channel Pacing Threshold Amplitude: 0.9 V
Lead Channel Pacing Threshold Amplitude: 1.3 V
Lead Channel Pacing Threshold Pulse Width: 0.4 ms
Lead Channel Pacing Threshold Pulse Width: 0.4 ms
Lead Channel Setting Pacing Amplitude: 1.9 V
Lead Channel Setting Pacing Amplitude: 2 V
Lead Channel Setting Pacing Amplitude: 2 V
Lead Channel Setting Pacing Pulse Width: 0.4 ms
Lead Channel Setting Pacing Pulse Width: 0.4 ms
Lead Channel Setting Sensing Sensitivity: 0.6 mV
Lead Channel Setting Sensing Sensitivity: 1 mV
Pulse Gen Serial Number: 279753

## 2022-11-29 ENCOUNTER — Other Ambulatory Visit (HOSPITAL_COMMUNITY): Payer: Self-pay

## 2022-11-29 ENCOUNTER — Telehealth: Payer: Self-pay

## 2022-11-29 NOTE — Telephone Encounter (Signed)
Filled out pt portion of Nov[employer] app online and faxed provider portion to PCP's office

## 2022-11-29 NOTE — Telephone Encounter (Signed)
Reaching out to pt via mychart regarding 2025 re enrollment through [employer] for [employer]

## 2022-11-30 ENCOUNTER — Other Ambulatory Visit: Payer: Self-pay | Admitting: Cardiovascular Disease

## 2022-12-02 NOTE — Telephone Encounter (Signed)
Received fax from [employer] today placed in provider folder for signature

## 2022-12-06 ENCOUNTER — Ambulatory Visit: Payer: Medicare Other | Admitting: Gastroenterology

## 2022-12-06 ENCOUNTER — Telehealth: Payer: Self-pay | Admitting: Gastroenterology

## 2022-12-06 ENCOUNTER — Encounter: Payer: Self-pay | Admitting: Podiatry

## 2022-12-06 ENCOUNTER — Ambulatory Visit (INDEPENDENT_AMBULATORY_CARE_PROVIDER_SITE_OTHER): Payer: Medicare Other | Admitting: Podiatry

## 2022-12-06 VITALS — Ht 72.0 in | Wt 213.1 lb

## 2022-12-06 DIAGNOSIS — M79674 Pain in right toe(s): Secondary | ICD-10-CM | POA: Diagnosis not present

## 2022-12-06 DIAGNOSIS — M79675 Pain in left toe(s): Secondary | ICD-10-CM

## 2022-12-06 DIAGNOSIS — B351 Tinea unguium: Secondary | ICD-10-CM | POA: Diagnosis not present

## 2022-12-06 DIAGNOSIS — E1142 Type 2 diabetes mellitus with diabetic polyneuropathy: Secondary | ICD-10-CM | POA: Diagnosis not present

## 2022-12-06 MED ORDER — PANTOPRAZOLE SODIUM 40 MG PO TBEC: 40.0000 mg | DELAYED_RELEASE_TABLET | Freq: Every day | ORAL | 0 refills | Status: DC

## 2022-12-06 NOTE — Telephone Encounter (Signed)
Patient called in for a medication refill (Protonix) 40 MG and the pharmacy is  [facility] on [address] [place], [place], [place] [address].

## 2022-12-09 NOTE — Telephone Encounter (Signed)
Rx sent through [employer]-scribe on 12/06/2022

## 2022-12-09 NOTE — Progress Notes (Unsigned)
Date:  12/10/2022   ID:  [name], DOB [birth year], MRN [id number]  Patient Location:  [address] [place] [address]   Provider location:   [facility], [place] office  PCP:  Allegra Grana, [name]  Cardiologist:  [facility]  Chief Complaint  Patient presents with   Follow-up    2 wk f/u post cath no complaints today. Meds reviewed verbally with pt.    History of Present Illness:    Darryal Gotte is a 82 y.o. male  past medical history of CAD ,NSTEMI,  stenting was 74, and  2005 , 4/21, 6/05 May 2019 , atherectomy, drug-eluting stent  STENT RESOLUTE ONYX 4.5X18, postdilated LAD and angioplasty of diagonal VT followed by EP DM II HTN Hyperlipidemia Melanoma Abdominal aorta atherosclerosis ICD 01/2020 for EF 35% to 40% Ef in 6/23: ef 55 to 60% Ejection fraction November 20, 2022  40 to 45% Who presents for routine follow-up of his coronary artery disease, moderate aortic valve stenosis  Last seen by myself in clinic October 2023 Last seen by EP April 2024  Seen by EP in clinic November 22, 2022, phrenic nerve stimulation from device Blood pressure 102/58  Was sent for cardiac catheterization November 26, 2022, chronic occlusion of RCA with collaterals, no significant change, medical management recommended  Phrenic nerve stim little better Takes neurontin before bed, feels better  Not needing midodrine Only taken x2 in past year  Stop ozempic before heart cath, Restarted yesterday, sugars running high  Crestor every other day, zetia daily  Chronic back pain, trouble walking  EKG personally reviewed by myself on todays visit EKG Interpretation Date/Time:  Tuesday December 10 2022 11:09:39 EST Ventricular Rate:  55 PR Interval:  154 QRS Duration:  150 QT Interval:  498 QTC Calculation: 476 R Axis:   263  Text Interpretation: Atrial-sensed ventricular-paced rhythm Biventricular pacemaker detected When  compared with ECG of 22-Nov-2022 14:24, No significant change was found Confirmed by Julien Nordmann [phone]) on 12/10/2022 11:50:40 AM   Recent imaging studies reviewed Echo 6/23 Left ventricular ejection fraction, by estimation, is 55 to 60%. The  left ventricle has normal function. The left ventricle has no regional  wall motion abnormalities. Left ventricular diastolic parameters are  consistent with Grade I diastolic  dysfunction (impaired relaxation).   2. Right ventricular systolic function is normal. The right ventricular  size is normal.   3. The mitral valve is normal in structure. No evidence of mitral valve  regurgitation.   4. The aortic valve is tricuspid. Aortic valve regurgitation is not  visualized. Mild to moderate aortic valve stenosis. Aortic valve mean  gradient measures 18.0 mmHg.  EKG personally reviewed by myself on todays visit NSR atrial sensed V paced rate 62 bpm  Other past medical history reviewed CT ABD/pelvis 07/11/2020 Mild diffuse aortic athero Small pericardial effusion, anterior  Followed by Dr. Lalla Brothers, No sx of VT  Cath 06/2019:  Catheterization chronically occluded RCA collaterals from left to right, Severe proximal LAD disease 80%, stent placed. Underwent angioplasty of diagonal vessel, 75% lesion Patent stent in the ramus  Echo 12/2019 Left ventricular ejection fraction, by estimation, is 35 to 40%.\ ICD implant 02/02/2020  RV lead thresholds ahd risen significantly, was decided to pursue lead revision with suspect microdislodgement.  Echocardiogram December 2021 Ejection fraction 35 to 40%  Dr. Lalla Brothers noted his eliquis 2/2 LV thrombus that had since resolved and discontinued it.  Non-STEMI June 2021: [facility]  Catheterization chronically occluded RCA collaterals from left to right Severe proximal LAD disease 80%, stent placed Angioplasty of diagonal vessel, 75% lesion  ZIO monitor with nonsustained VT, PVCs 10% burden  Cardiac  MRI Ejection fraction 35%  LGE in the basal and mid inferior and inferior lateral walls, dyskinesis of the basal and inferior inferolateral walls and a left ventricular mural thrombus located on the inferolateral wall.   Past Medical History:  Diagnosis Date   Aortic insufficiency    a. noted on TTE 2015; b. 06/2019 Echo: AI not visualized.   Arthritis    CAD (coronary artery disease)    a. remote PCI in 1991 and 2005; b. MV 3/15: old inferior MI, no ischemia, LVEF 50%, slight inferior wall hypokniesis; c. 06/2019 NSTEMI/PCI: LM nl, LAD 80p/m (Atherectomy & 4.5x18 Resolute Onyx DES), 67m/d, D1 75 (PTCA), RI patent stent, LCX nl, RCA 100p, RPAV fills via L->R collats from LCX.   Chicken pox    Colon polyps    4 pre-cancerous    Diverticulitis    DM type 2 (diabetes mellitus, type 2) (HCC)    Family history of adverse reaction to anesthesia    GERD (gastroesophageal reflux disease)    Heart murmur    History of kidney stones    HOH (hard of hearing)    Hyperlipidemia    Hypertension    Ischemic cardiomyopathy    a. TTE 2015: EF  50-55%, mild global HK; b. 06/2019 Echo: EF 45-50%, Gr1 DD, basal inf AK. Triv MR.   Kidney stones    Melanoma (HCC) 1980   Resected from his back   Mitral regurgitation    a. noted on TTE 2015   Myocardial infarction [facility])    Past Surgical History:  Procedure Laterality Date   BIV UPGRADE N/A 02/26/2021   Procedure: BIV ICD UPGRADE;  Surgeon: Lanier Prude, MD;  Location: [facility] INVASIVE CV LAB;  Service: Cardiovascular;  Laterality: N/A;   CHOLECYSTECTOMY  2012   COLONOSCOPY     in 2003 with polyp removed and leak anastomosis had to have open abdominal surgery    CORONARY ANGIOPLASTY WITH STENT PLACEMENT  1991 & 2005   CORONARY ATHERECTOMY N/A 07/08/2019   Procedure: CORONARY ATHERECTOMY;  Surgeon: Corky Crafts, MD;  Location: [facility] INVASIVE CV LAB;  Service: Cardiovascular;  Laterality: N/A;   CORONARY BALLOON ANGIOPLASTY N/A 07/08/2019    Procedure: CORONARY BALLOON ANGIOPLASTY;  Surgeon: Corky Crafts, MD;  Location: [facility] INVASIVE CV LAB;  Service: Cardiovascular;  Laterality: N/A;  diagonal    CORONARY PRESSURE/FFR STUDY N/A 07/07/2019   Procedure: INTRAVASCULAR PRESSURE WIRE/FFR STUDY;  Surgeon: Yvonne Kendall, MD;  Location: [facility] INVASIVE CV LAB;  Service: Cardiovascular;  Laterality: N/A;   CORONARY STENT INTERVENTION N/A 07/08/2019   Procedure: CORONARY STENT INTERVENTION;  Surgeon: Corky Crafts, MD;  Location[facility] INVASIVE CV LAB;  Service: Cardiovascular;  Laterality: N/A;  lad   CORONARY ULTRASOUND/IVUS N/A 07/08/2019   Procedure: Intravascular Ultrasound/IVUS;  Surgeon: Corky Crafts, MD;  Location: [facility] INVASIVE CV LAB;  Service: Cardiovascular;  Laterality: N/A;   CYSTOSCOPY/URETEROSCOPY/HOLMIUM LASER/STENT PLACEMENT Right 01/02/2018   Procedure: CYSTOSCOPY/URETEROSCOPY/HOLMIUM LASER/STENT PLACEMENT;  Surgeon: Sondra Come, MD;  Location: [facility] ORS;  Service: Urology;  Laterality: Right;   ESOPHAGOGASTRODUODENOSCOPY (EGD) WITH PROPOFOL N/A 11/19/2016   Procedure: ESOPHAGOGASTRODUODENOSCOPY (EGD) WITH PROPOFOL;  Surgeon: Midge Minium, MD;  Location: [facility] ENDOSCOPY;  Service: Endoscopy;  Laterality: N/A;   ESOPHAGOGASTRODUODENOSCOPY (EGD) WITH PROPOFOL N/A 02/02/2019   Procedure:  ESOPHAGOGASTRODUODENOSCOPY (EGD) WITH PROPOFOL;  Surgeon: Midge Minium, MD;  Location: [facility] ENDOSCOPY;  Service: Endoscopy;  Laterality: N/A;   ICD IMPLANT N/A 02/02/2020   Procedure: ICD IMPLANT;  Surgeon: Lanier Prude, MD;  Location: [facility] INVASIVE CV LAB;  Service: Cardiovascular;  Laterality: N/A;   LEAD REVISION/REPAIR N/A 02/16/2020   Procedure: LEAD REVISION/REPAIR;  Surgeon: Regan Lemming, MD;  Location: [facility] INVASIVE CV LAB;  Service: Cardiovascular;  Laterality: N/A;   LEFT HEART CATH N/A 07/08/2019   Procedure: Left Heart Cath;  Surgeon: Corky Crafts, MD;  Location: [facility] INVASIVE CV LAB;  Service:  Cardiovascular;  Laterality: N/A;   LEFT HEART CATH AND CORONARY ANGIOGRAPHY N/A 07/07/2019   Procedure: LEFT HEART CATH AND CORONARY ANGIOGRAPHY;  Surgeon: Yvonne Kendall, MD;  Location: [facility] INVASIVE CV LAB;  Service: Cardiovascular;  Laterality: N/A;   LITHOTRIPSY  2015   MELANOMA EXCISION  1980   malignant   NERVE SURGERY  2015   ulna nerve   pre cancer lesion from scalp  09/2022   RIGHT HEART CATH AND CORONARY ANGIOGRAPHY Bilateral 11/26/2022   Procedure: RIGHT HEART CATH AND CORONARY ANGIOGRAPHY;  Surgeon: Yvonne Kendall, MD;  Location: [facility] INVASIVE CV LAB;  Service: Cardiovascular;  Laterality: Bilateral;   TONSILLECTOMY  1945   WISDOM TOOTH EXTRACTION       Current Meds  Medication Sig   aspirin EC 81 MG tablet Take 1 tablet (81 mg total) by mouth daily. Swallow whole.   azelastine (ASTELIN) 0.1 % nasal spray Place 1 spray into both nostrils 2 (two) times daily. Use in each nostril as directed (Patient taking differently: Place 1 spray into both nostrils 2 (two) times daily as needed for rhinitis. Use in each nostril as directed)   bisacodyl (DULCOLAX) 5 MG EC tablet Take 5 mg by mouth daily as needed for moderate constipation.   Blood Glucose Monitoring Suppl (ONETOUCH VERIO FLEX SYSTEM) w/Device KIT    Blood Glucose Monitoring Suppl DEVI 1 each by Does not apply route in the morning and at bedtime. May substitute to any manufacturer covered by patient's insurance.   clopidogrel (PLAVIX) 75 MG tablet TAKE 1 TABLET(75 MG) BY MOUTH DAILY.   Continuous Glucose Monitor Sup MISC 1 Units by Does not apply route daily.   Continuous Glucose Receiver DEVI 1 Units by Does not apply route daily.   dextromethorphan-guaiFENesin (MUCINEX DM) 30-600 MG 12hr tablet Take 1 tablet by mouth daily as needed (Sinus Congestion).   ezetimibe (ZETIA) 10 MG tablet TAKE 1 TABLET(10 MG) BY MOUTH DAILY   famotidine (PEPCID) 20 MG tablet Take 20 mg by mouth daily with lunch.   gabapentin (NEURONTIN) 100  MG capsule TAKE 1 CAPSULE(100 MG) BY MOUTH THREE TIMES DAILY (Patient taking differently: Take 100 mg by mouth at bedtime.)   glipiZIDE (GLUCOTROL) 10 MG tablet TAKE 1/2 TABLET BY MOUTH EVERY MORNING WITH BREAKFAST AND 1 TABLET EVERY EVENING WITH SUPPER. (Patient taking differently: Take 10 mg by mouth 2 (two) times daily before a meal.)   Glycerin-Hypromellose-PEG 400 (DRY EYE RELIEF DROPS) 0.2-0.2-1 % SOLN Place 1 drop into both eyes daily.   lisinopril (ZESTRIL) 10 MG tablet Take 10 mg by mouth daily.   midodrine (PROAMATINE) 5 MG tablet Take 1 tablet (5 mg total) by mouth 3 (three) times daily as needed. Take when BP does drop less than 110 when standing. Additional dose at 2 pm if still dropping below 110 when standing   Multiple Vitamin (MULTIVITAMIN) tablet Take 1 tablet  by mouth daily with lunch.   Omega-3 Fatty Acids (FISH OIL PO) Take 1,500 mg by mouth daily with lunch.   ONETOUCH VERIO test strip USE TO CHECK BLOOD SUGAR TWICE DAILY AS DIRECTED   pantoprazole (PROTONIX) 40 MG tablet Take 1 tablet (40 mg total) by mouth daily.   Psyllium 83 % POWD Take 15 mLs by mouth at bedtime.   rosuvastatin (CRESTOR) 10 MG tablet Take 10 mg by mouth every other day.   Semaglutide, 1 MG/DOSE, 4 MG/3ML SOPN Inject 1 mg as directed once a week.   sodium chloride (OCEAN) 0.65 % SOLN nasal spray Place 1 spray into both nostrils daily as needed for congestion (Sinus).   spironolactone (ALDACTONE) 25 MG tablet TAKE 1 TABLET(25 MG) BY MOUTH DAILY   tamsulosin (FLOMAX) 0.4 MG CAPS capsule TAKE ONE CAPSULE BY MOUTH DAILY   traMADol (ULTRAM) 50 MG tablet TAKE 1 TABLET BY MOUTH EVERY 12 HOURS AS NEEDED   [DISCONTINUED] amiodarone (PACERONE) 200 MG tablet Take 1 tablet (200 mg total) by mouth daily.   [DISCONTINUED] carvedilol (COREG) 3.125 MG tablet TAKE 1 TABLET(3.125 MG) BY MOUTH TWICE DAILY     Allergies:   Metformin and related, Azithromycin, Other, Percocet [oxycodone-acetaminophen], and Jardiance  [empagliflozin]   Social History   Tobacco Use   Smoking status: Some Days    Types: Pipe    Passive exposure: Current   Smokeless tobacco: Former    Quit date: 10/17/2015  Vaping Use   Vaping status: Never Used  Substance Use Topics   Alcohol use: Yes    Alcohol/week: 0.0 - 1.0 standard drinks of alcohol    Comment: OCCASIONALLY   Drug use: No     Family Hx: The patient's family history includes Colon cancer in his father; Diabetes in his father and mother; Heart attack in his mother; Heart disease in his mother; Hyperlipidemia in his mother; Hypertension in his mother; Kidney cancer in his father; Liver cancer in his father; Lung cancer in his father. There is no history of Bladder Cancer or Prostate cancer.  ROS:   Please see the history of present illness.    Review of Systems  Constitutional: Negative.   HENT: Negative.    Respiratory: Negative.    Cardiovascular: Negative.   Gastrointestinal: Negative.   Neurological: Negative.   Psychiatric/Behavioral: Negative.    All other systems reviewed and are negative.   Labs/Other Tests and Data Reviewed:    Recent Labs: 02/09/2022: B Natriuretic Peptide 82.8 10/23/2022: ALT 37 11/22/2022: BUN 22; Creatinine, Ser 1.04; Platelets 163; TSH 4.790 11/26/2022: Hemoglobin 12.9; Potassium 4.0; Sodium 139   Recent Lipid Panel Lab Results  Component Value Date/Time   CHOL 118 06/19/2022 10:09 AM   TRIG 212.0 (H) 06/19/2022 10:09 AM   HDL 39.90 06/19/2022 10:09 AM   CHOLHDL 3 06/19/2022 10:09 AM   LDLCALC 35 12/21/2019 09:19 AM   LDLDIRECT 64.0 06/19/2022 10:09 AM    Wt Readings from Last 3 Encounters:  12/10/22 211 lb 6 oz (95.9 kg)  12/06/22 213 lb 2.1 oz (96.7 kg)  11/22/22 213 lb 2 oz (96.7 kg)     Exam:    BP 126/78 (BP Location: Left Arm, Patient Position: Sitting, Cuff Size: Normal)   Pulse (!) 55   Ht 6' 0.5" (1.842 m)   Wt 211 lb 6 oz (95.9 kg)   SpO2 96%   BMI 28.27 kg/m  Constitutional:  oriented to  person, place, and time. No distress.  HENT:  Head:  Grossly normal Eyes:  no discharge. No scleral icterus.  Neck: No JVD, no carotid bruits  Cardiovascular: Regular rate and rhythm, no murmurs appreciated Pulmonary/Chest: Clear to auscultation bilaterally, no wheezes or rails Abdominal: Soft.  no distension.  no tenderness.  Musculoskeletal: Normal range of motion Neurological:  normal muscle tone. Coordination normal. No atrophy Skin: Skin warm and dry Psychiatric: normal affect, pleasant   ASSESSMENT & PLAN:    Orthostatic hypotension Reports blood pressure stable, has not required midodrine  Sustained VT Monitored through ICD downloads, on carvedilol, amiodarone Managed by EP  Atherosclerosis of native coronary artery with stable angina pectoris, unspecified whether native or transplanted heart [facility]) Prior history of cardiomyopathy ejection fraction 35 to 40% December 2022 Normalized ejection fraction June 2023 on echo Drop in ejection fraction back to 40% November 2024 Recent catheterization results reviewed occluded RCA with collaterals left-to-right Continue carvedilol spironolactone and lisinopril Discuss transitioning off lisinopril onto Entresto, consider Jardiance/Farxiga He will research these and let us know  Diarrhea:  Reports more constipation   cardiomyopathy, ischemic Coreg, lisinopril, spironolactone EF 35 to 40% appear to have normalized, back to 40% on recent echo Discussed adding medications above Entresto, Jardiance/Farxiga  Aortic valve stenosis Moderate stenosis on recent echo Annual echo  Nonsustained VT, PVCs Has  ICD, followed EP Lead revision successful sustained VT requiring antitachycardia pacing Previously reported some diaphragmatic stimulation, takes Neurontin with improved symptoms  Atherosclerosis of abdominal aorta (HCC)  mild disease, no claudication sx Cholesterol at goal  Type 2 diabetes mellitus with other circulatory  complication, without long-term current use of insulin (HCC) Stable, A1c trending higher 6.9 Remains on Ozempic  Carotid artery disease, unspecified laterality (HCC) U/s in 2017 Minor carotid atherosclerosis.      Signed, Julien Nordmann, MD  12/10/2022 1:13 PM    [facility] [address], [place], [place] [address]

## 2022-12-10 ENCOUNTER — Encounter: Payer: Self-pay | Admitting: Cardiovascular Disease

## 2022-12-10 ENCOUNTER — Ambulatory Visit: Payer: Medicare Other | Attending: Cardiovascular Disease | Admitting: Cardiovascular Disease

## 2022-12-10 VITALS — BP 126/78 | HR 55 | Ht 72.5 in | Wt 211.4 lb

## 2022-12-10 DIAGNOSIS — I2510 Atherosclerotic heart disease of native coronary artery without angina pectoris: Secondary | ICD-10-CM | POA: Diagnosis not present

## 2022-12-10 DIAGNOSIS — I5022 Chronic systolic (congestive) heart failure: Secondary | ICD-10-CM | POA: Diagnosis not present

## 2022-12-10 DIAGNOSIS — I472 Ventricular tachycardia, unspecified: Secondary | ICD-10-CM | POA: Diagnosis not present

## 2022-12-10 DIAGNOSIS — R5383 Other fatigue: Secondary | ICD-10-CM

## 2022-12-10 DIAGNOSIS — Z95810 Presence of automatic (implantable) cardiac defibrillator: Secondary | ICD-10-CM

## 2022-12-10 DIAGNOSIS — I779 Disorder of arteries and arterioles, unspecified: Secondary | ICD-10-CM

## 2022-12-10 DIAGNOSIS — I255 Ischemic cardiomyopathy: Secondary | ICD-10-CM | POA: Diagnosis not present

## 2022-12-10 DIAGNOSIS — I442 Atrioventricular block, complete: Secondary | ICD-10-CM

## 2022-12-10 DIAGNOSIS — I25118 Atherosclerotic heart disease of native coronary artery with other forms of angina pectoris: Secondary | ICD-10-CM

## 2022-12-10 MED ORDER — CARVEDILOL 3.125 MG PO TABS: 3.1250 mg | ORAL_TABLET | Freq: Two times a day (BID) | ORAL | 3 refills | Status: DC

## 2022-12-10 MED ORDER — AMIODARONE HCL 200 MG PO TABS: 200.0000 mg | ORAL_TABLET | Freq: Every day | ORAL | 3 refills | Status: AC

## 2022-12-10 NOTE — Patient Instructions (Addendum)
Medication Instructions:  No changes  Research jardiance/farxiga  Research entresto instead of lisinopril  If you need a refill on your cardiac medications before your next appointment, please call your pharmacy.   Lab work: No new labs needed  Testing/Procedures: No new testing needed  Follow-Up: At [facility], you and your health needs are our priority.  As part of our continuing mission to provide you with exceptional heart care, we have created designated Provider Care Teams.  These Care Teams include your primary Cardiologist (physician) and Advanced Practice Providers (APPs -  Physician Assistants and Nurse Practitioners) who all work together to provide you with the care you need, when you need it.  You will need a follow up appointment in 6 months  Providers on your designated Care Team:   Nicolasa Ducking, NP Eula Listen, PA-C Cadence Fransico Michael, [place]  COVID-19 Vaccine Information can be found at: [email or web address] For questions related to vaccine distribution or appointments, please email [email or web address] or call [phone].

## 2022-12-14 NOTE — Progress Notes (Signed)
  Subjective:  Patient ID: [name] [name], male    DOB: September 25, 1940,  MRN: [id number]  82 y.o. male presents to clinic with  painful elongated mycotic toenails 1-5 bilaterally which are tender when wearing enclosed shoe gear. Pain is relieved with periodic professional debridement.  Chief Complaint  Patient presents with   Nail Problem    Pt is here for [facility], last A1C was under 7, PCP is Dr Jason Coop and LOV was in October.     New problem(s): None   PCP is [name], Lyn Records, [name].  Allergies  Allergen Reactions   Metformin And Related Diarrhea and Other (See Comments)    Leg cramps, also   Azithromycin Other (See Comments)    Not recommended for cardiac patients- no reaction   Other Other (See Comments)    If taking antibiotics for awhile, thrush results   Percocet [Oxycodone-Acetaminophen] Other (See Comments)    Hallucinations   Jardiance [Empagliflozin] Other (See Comments)    Tongue itching/ Thrush reaction    Review of Systems: Negative except as noted in the HPI.   Objective:  [name] [name] is a pleasant 82 y.o. male WD, WN in NAD. AAO x 3.  Vascular Examination: Vascular status intact b/l with palpable pedal pulses. CFT immediate b/l. No edema. No pain with calf compression b/l. Skin temperature gradient WNL b/l. No cyanosis or clubbing noted b/l LE.  Neurological Examination: Pt has subjective symptoms of neuropathy. Sensation grossly intact b/l with 10 gram monofilament. Vibratory sensation intact b/l.   Dermatological Examination: Pedal skin with normal turgor, texture and tone b/l. Toenails 1-5 b/l thick, discolored, elongated with subungual debris and pain on dorsal palpation. No hyperkeratotic lesions noted b/l.   Musculoskeletal Examination: Muscle strength 5/5 to b/l LE. Hammertoe deformity noted 2-5 b/l.  Radiographs: None  Last A1c:      Latest Ref Rng & Units 10/23/2022    9:43 AM 05/29/2022   10:08 AM  Hemoglobin A1C  Hemoglobin-A1c 4.0 - 5.6 %  6.9  6.8      Assessment:   1. Pain due to onychomycosis of toenails of both feet   2. Diabetic polyneuropathy associated with type 2 diabetes mellitus (HCC)    Plan:  Patient was evaluated and treated. All patient's and/or POA's questions/concerns addressed on today's visit. Mycotic toenails 1-5 debrided in length and girth without incident. Continue soft, supportive shoe gear daily. Report any pedal injuries to medical professional. Call office if there are any quesitons/concerns. -Patient/POA to call should there be question/concern in the interim.  Return in about 3 months (around 03/08/2023).  Freddie Breech, DPM      [facility] LOCATION: [address] [place] [address]                   Office [phone]   [facility] LOCATION: [address] [place], [place] [address] Office [phone]

## 2022-12-16 ENCOUNTER — Other Ambulatory Visit: Payer: Self-pay | Admitting: Cardiovascular Disease

## 2022-12-24 ENCOUNTER — Encounter: Payer: Self-pay | Admitting: Gastroenterology

## 2022-12-24 ENCOUNTER — Ambulatory Visit: Payer: Medicare Other | Admitting: Gastroenterology

## 2022-12-24 VITALS — BP 143/81 | HR 55 | Temp 97.9°F | Wt 211.0 lb

## 2022-12-24 DIAGNOSIS — K219 Gastro-esophageal reflux disease without esophagitis: Secondary | ICD-10-CM | POA: Diagnosis not present

## 2022-12-24 DIAGNOSIS — Z860100 Personal history of colon polyps, unspecified: Secondary | ICD-10-CM

## 2022-12-24 DIAGNOSIS — K5900 Constipation, unspecified: Secondary | ICD-10-CM

## 2022-12-24 MED ORDER — PANTOPRAZOLE SODIUM 40 MG PO TBEC: 40.0000 mg | DELAYED_RELEASE_TABLET | Freq: Every day | ORAL | 3 refills | Status: AC

## 2022-12-24 NOTE — Progress Notes (Signed)
Primary Care Physician: Allegra Grana, [name]  Primary Gastroenterologist:  Dr. Midge Minium  Chief Complaint  Patient presents with   Follow-up   Gastroesophageal Reflux    HPI: [name] [name] is a 82 y.o. male here with a history of see me in the past for reflux.  The patient was last seen back in October 2023 and had been doing well on the pantoprazole.  At the last visit the patient had been told about his history of colon polyps and we had elected at that time to hold off on any further colonoscopies.  The patient uses laxatives for his constipation. The patient denies any abdominal pain nausea vomiting fevers chills black stools or bloody stools.  He also denies any unexplained weight loss and states he has actually gained some weight.  Past Medical History:  Diagnosis Date   Aortic insufficiency    a. noted on TTE 2015; b. 06/2019 Echo: AI not visualized.   Arthritis    CAD (coronary artery disease)    a. remote PCI in 1991 and 2005; b. MV 3/15: old inferior MI, no ischemia, LVEF 50%, slight inferior wall hypokniesis; c. 06/2019 NSTEMI/PCI: LM nl, LAD 80p/m (Atherectomy & 4.5x18 Resolute Onyx DES), 35m/d, D1 75 (PTCA), RI patent stent, LCX nl, RCA 100p, RPAV fills via L->R collats from LCX.   Chicken pox    Colon polyps    4 pre-cancerous    Diverticulitis    DM type 2 (diabetes mellitus, type 2) (HCC)    Family history of adverse reaction to anesthesia    GERD (gastroesophageal reflux disease)    Heart murmur    History of kidney stones    HOH (hard of hearing)    Hyperlipidemia    Hypertension    Ischemic cardiomyopathy    a. TTE 2015: EF  50-55%, mild global HK; b. 06/2019 Echo: EF 45-50%, Gr1 DD, basal inf AK. Triv MR.   Kidney stones    Melanoma (HCC) 1980   Resected from his back   Mitral regurgitation    a. noted on TTE 2015   Myocardial infarction [facility])     Current Outpatient Medications  Medication Sig Dispense Refill   amiodarone (PACERONE) 200 MG  tablet Take 1 tablet (200 mg total) by mouth daily. 90 tablet 3   aspirin EC 81 MG tablet Take 1 tablet (81 mg total) by mouth daily. Swallow whole. 90 tablet 3   azelastine (ASTELIN) 0.1 % nasal spray Place 1 spray into both nostrils 2 (two) times daily. Use in each nostril as directed (Patient taking differently: Place 1 spray into both nostrils 2 (two) times daily as needed for rhinitis. Use in each nostril as directed) 30 mL 6   bisacodyl (DULCOLAX) 5 MG EC tablet Take 5 mg by mouth daily as needed for moderate constipation.     Blood Glucose Monitoring Suppl (ONETOUCH VERIO FLEX SYSTEM) w/Device KIT      Blood Glucose Monitoring Suppl DEVI 1 each by Does not apply route in the morning and at bedtime. May substitute to any manufacturer covered by patient's insurance. 1 each 0   carvedilol (COREG) 3.125 MG tablet Take 1 tablet (3.125 mg total) by mouth 2 (two) times daily with a meal. 180 tablet 3   clopidogrel (PLAVIX) 75 MG tablet TAKE 1 TABLET(75 MG) BY MOUTH DAILY. 30 tablet 3   Continuous Glucose Monitor Sup MISC 1 Units by Does not apply route daily. 1 Units 0   Continuous Glucose  Receiver DEVI 1 Units by Does not apply route daily. 1 Units 0   dextromethorphan-guaiFENesin (MUCINEX DM) 30-600 MG 12hr tablet Take 1 tablet by mouth daily as needed (Sinus Congestion).     ezetimibe (ZETIA) 10 MG tablet TAKE 1 TABLET(10 MG) BY MOUTH DAILY 90 tablet 3   famotidine (PEPCID) 20 MG tablet Take 20 mg by mouth daily with lunch.     gabapentin (NEURONTIN) 100 MG capsule TAKE 1 CAPSULE(100 MG) BY MOUTH THREE TIMES DAILY (Patient taking differently: Take 100 mg by mouth at bedtime.) 90 capsule 3   glipiZIDE (GLUCOTROL) 10 MG tablet TAKE 1/2 TABLET BY MOUTH EVERY MORNING WITH BREAKFAST AND 1 TABLET EVERY EVENING WITH SUPPER. (Patient taking differently: Take 10 mg by mouth 2 (two) times daily before a meal.) 120 tablet 3   Glycerin-Hypromellose-PEG 400 (DRY EYE RELIEF DROPS) 0.2-0.2-1 % SOLN Place 1 drop  into both eyes daily.     lisinopril (ZESTRIL) 10 MG tablet Take 10 mg by mouth daily.     midodrine (PROAMATINE) 5 MG tablet Take 1 tablet (5 mg total) by mouth 3 (three) times daily as needed. Take when BP does drop less than 110 when standing. Additional dose at 2 pm if still dropping below 110 when standing 90 tablet 1   Multiple Vitamin (MULTIVITAMIN) tablet Take 1 tablet by mouth daily with lunch.     Omega-3 Fatty Acids (FISH OIL PO) Take 1,500 mg by mouth daily with lunch.     ONETOUCH VERIO test strip USE TO CHECK BLOOD SUGAR TWICE DAILY AS DIRECTED 100 strip 1   pantoprazole (PROTONIX) 40 MG tablet Take 1 tablet (40 mg total) by mouth daily. 90 tablet 0   Psyllium 83 % POWD Take 15 mLs by mouth at bedtime.     rosuvastatin (CRESTOR) 10 MG tablet Take 10 mg by mouth every other day.     Semaglutide, 1 MG/DOSE, 4 MG/3ML SOPN Inject 1 mg as directed once a week. 3 mL 3   sodium chloride (OCEAN) 0.65 % SOLN nasal spray Place 1 spray into both nostrils daily as needed for congestion (Sinus).     spironolactone (ALDACTONE) 25 MG tablet TAKE 1 TABLET(25 MG) BY MOUTH DAILY 90 tablet 3   tamsulosin (FLOMAX) 0.4 MG CAPS capsule TAKE ONE CAPSULE BY MOUTH DAILY 90 capsule 3   traMADol (ULTRAM) 50 MG tablet TAKE 1 TABLET BY MOUTH EVERY 12 HOURS AS NEEDED 60 tablet 2   No current facility-administered medications for this visit.    Allergies as of 12/24/2022 - Review Complete 12/24/2022  Allergen Reaction Noted   Metformin and related Diarrhea and Other (See Comments) 07/13/2018   Azithromycin Other (See Comments) 07/07/2015   Other Other (See Comments) 07/07/2019   Percocet [oxycodone-acetaminophen] Other (See Comments) 07/07/2015   Jardiance [empagliflozin] Other (See Comments) 08/03/2019    ROS:  General: Negative for anorexia, weight loss, fever, chills, fatigue, weakness. ENT: Negative for hoarseness, difficulty swallowing , nasal congestion. CV: Negative for chest pain, angina,  palpitations, dyspnea on exertion, peripheral edema.  Respiratory: Negative for dyspnea at rest, dyspnea on exertion, cough, sputum, wheezing.  GI: See history of present illness. GU:  Negative for dysuria, hematuria, urinary incontinence, urinary frequency, nocturnal urination.  Endo: Negative for unusual weight change.    Physical Examination:   There were no vitals taken for this visit.  General: Well-nourished, well-developed in no acute distress.  Eyes: No icterus. Conjunctivae pink. Neuro: Alert and oriented x 3.  Grossly intact. Skin:  Warm and dry, no jaundice.   Psych: Alert and cooperative, normal mood and affect.  Labs:    Imaging Studies: CUP PACEART REMOTE DEVICE CHECK  Result Date: 11/28/2022 Scheduled remote reviewed. Normal device function.  Next remote 91 days. ML, CVRS Scheduled remote reviewed. Normal device function.  Next remote 91 days. ML, CVRS  CARDIAC CATHETERIZATION  Result Date: 11/26/2022 Conclusions: Multivessel coronary artery disease, as detailed above, including heavily calcified and aneurysmal proximal/mid LAD.  There is a 50% stenosis in the mid to distal LAD.  Chronic total occlusion of the RCA again noted with the distal branches filling via left-to-right collaterals.  LCx is without significant disease.  Overall appearance is fairly stable compared to completion of LAD PCI in 2021. Widely patent mid LAD and ramus intermedius stents. Normal left heart filling pressure (PW 13 mmHg). Mildly elevated right heart and pulmonary artery pressures (mean RA 8, RVEDP 8, mean PA 23 mmHg). Recommendations: Continue aggressive secondary prevention of coronary artery disease.  Defer indefinite dual versus single antiplatelet therapy to primary cardiology team. Continue escalation of goal-directed medical therapy for heart failure with mildly reduced ejection fraction, as tolerated. Continue echo monitoring of moderate aortic stenosis, which was not evaluated during  today's procedure. Yvonne Kendall, MD [facility]   Assessment and Plan:   [name] [name] is a 82 y.o. y/o male who comes in today with a history of GERD.  The patient will have his Protonix refilled.  The patient also has a history of constipation which is being controlled with his laxatives.  We again spoke about his history of colon polyps and the patient has elected not to pursue any further colonoscopies at his age especially since he has a very close friend who had a complication of a colonoscopy with post polypectomy bleeding and the patient is presently in the ICU at [facility].  The patient has been told about the risks and benefits and we are going to hold off on any further colonoscopies at this time.  The patient has been explained the plan and agrees with it.     Midge Minium, MD. Clementeen Graham    Note: This dictation was prepared with Dragon dictation along with smaller phrase technology. Any transcriptional errors that result from this process are unintentional.

## 2022-12-24 NOTE — Progress Notes (Signed)
Remote ICD transmission.   

## 2022-12-24 NOTE — Telephone Encounter (Signed)
PAP: Patient assistance application for Ozempic has been approved by P[employer] Companies: [employer] from 01/15/2023 to 01/14/2024. Medication should be delivered to PAP Delivery: Provider's office For further shipping updates, please contact [employer] at [phone] Pt ID is: [id number]

## 2023-01-03 ENCOUNTER — Other Ambulatory Visit: Payer: Self-pay | Admitting: Family

## 2023-01-03 DIAGNOSIS — G8929 Other chronic pain: Secondary | ICD-10-CM

## 2023-01-16 ENCOUNTER — Other Ambulatory Visit: Payer: Self-pay | Admitting: Family

## 2023-01-23 ENCOUNTER — Encounter: Payer: Self-pay | Admitting: Family

## 2023-01-23 ENCOUNTER — Ambulatory Visit (INDEPENDENT_AMBULATORY_CARE_PROVIDER_SITE_OTHER): Payer: Medicare Other | Admitting: Family

## 2023-01-23 VITALS — BP 136/84 | HR 78 | Temp 98.0°F | Ht 72.0 in | Wt 209.8 lb

## 2023-01-23 DIAGNOSIS — R7309 Other abnormal glucose: Secondary | ICD-10-CM

## 2023-01-23 DIAGNOSIS — M545 Low back pain, unspecified: Secondary | ICD-10-CM

## 2023-01-23 DIAGNOSIS — Z7984 Long term (current) use of oral hypoglycemic drugs: Secondary | ICD-10-CM

## 2023-01-23 DIAGNOSIS — G8929 Other chronic pain: Secondary | ICD-10-CM

## 2023-01-23 DIAGNOSIS — E118 Type 2 diabetes mellitus with unspecified complications: Secondary | ICD-10-CM

## 2023-01-23 LAB — POCT GLYCOSYLATED HEMOGLOBIN (HGB A1C): Hemoglobin A1C: 7.4 % — AB (ref 4.0–5.6)

## 2023-01-23 MED ORDER — ONETOUCH ULTRASOFT LANCETS MISC: 12 refills | Status: AC

## 2023-01-23 NOTE — Patient Instructions (Addendum)
 As discussed, let's start by scheduling Tylenol  Arthritis which is a 650mg  tablet . We will continue to monitor liver enzymes.    Maximum daily dose of acetaminophen  4 g per day from all sources.  If you are taking another medication which includes acetaminophen  (Tylenol ) which may be in cough and cold preparations or pain medication such as Percocet, you will need to factor that into your total daily dose to be safe.  Please let me know if any questions  A great article regarding how to safely take and dose tylenol  found below.  Title : 'Acetaminophen  safety: Be cautious but not afraid'  [email or web address].[employer].edu/pain/acetaminophen -safety-be-cautious-but-not-afraid

## 2023-01-23 NOTE — Progress Notes (Signed)
 Assessment & Plan:  Controlled type 2 diabetes mellitus with complication, without long-term current use of insulin  [facility]) Assessment & Plan: Lab Results  Component Value Date   HGBA1C 7.4 (A) 01/23/2023   Slight increase from prior.  Discussed slight constipation while on Ozempic  therefore we opted not to increase.  Discussed use of MiraLAX scheduled.  Continue Glipizide  10 mg twice daily, Ozempic  1 mg.  We discussed focusing on dietary modifications as opposed to escalating diabetic medicines.  Orders: -     OneTouch UltraSoft Lancets; Use as instructed  Dispense: 100 each; Refill: 12  Elevated glucose -     POCT glycosylated hemoglobin (Hb A1C)  Chronic low back pain, unspecified back pain laterality, unspecified whether sciatica present Assessment & Plan: Chronic, suboptimal control.  Discussed tramadol  50mg  BID is not particularly effective.  Long discussion in regards to scheduling Tylenol  arthritis and appropriate doses.  Will continue to monitor liver enzymes      Return precautions given.   Risks, benefits, and alternatives of the medications and treatment plan prescribed today were discussed, and patient expressed understanding.   Education regarding symptom management and diagnosis given to patient on AVS either electronically or printed.  Return in about 3 months (around 04/23/2023).  [name] Northern, [name]  Subjective:    Patient ID: [name] [name], male    DOB: 07/15/1940, 83 y.o.   MRN: [id number]  CC: [name] [name] is a 83 y.o. male who presents today for follow up.   HPI: Feels well today  No new complaints  Endorses dietary discretion over the holidays  FBG 159, 130, 146, 159, 115  No CP, leg swellig  DOE at baseline particularly when going up the stairs.    Compliant with ozempic  1 mg weekly; glipizide  10mg  qam and 10mg  qpm with meals.  No hypoglycemic episodes.    Supper is biggest meal however he has been decreasing portion size.    He  has noticed he may have BM every other day. He is using metamucil, colace with resolution.   He is not using bisacodyl.   He is taking tramadol  50mg  BID low back and hip pain, stiffness however does not feel was particularly helpful. He is taking tylenol  extra strength at bedtime with relief.    BP at home 122/65, 114/75   Allergies: Metformin  and related, Azithromycin, Other, Percocet [oxycodone -acetaminophen ], and Jardiance  [empagliflozin ] Current Outpatient Medications on File Prior to Visit  Medication Sig Dispense Refill   amiodarone  (PACERONE ) 200 MG tablet Take 1 tablet (200 mg total) by mouth daily. 90 tablet 3   aspirin  EC 81 MG tablet Take 1 tablet (81 mg total) by mouth daily. Swallow whole. 90 tablet 3   azelastine  (ASTELIN ) 0.1 % nasal spray Place 1 spray into both nostrils 2 (two) times daily. Use in each nostril as directed (Patient taking differently: Place 1 spray into both nostrils 2 (two) times daily as needed for rhinitis. Use in each nostril as directed) 30 mL 6   bisacodyl (DULCOLAX) 5 MG EC tablet Take 5 mg by mouth daily as needed for moderate constipation.     Blood Glucose Monitoring Suppl (ONETOUCH VERIO FLEX SYSTEM) w/Device KIT      Blood Glucose Monitoring Suppl DEVI 1 each by Does not apply route in the morning and at bedtime. May substitute to any manufacturer covered by patient's insurance. 1 each 0   carvedilol  (COREG ) 3.125 MG tablet Take 1 tablet (3.125 mg total) by mouth 2 (two) times daily with a  meal. 180 tablet 3   clopidogrel  (PLAVIX ) 75 MG tablet TAKE 1 TABLET(75 MG) BY MOUTH DAILY. 30 tablet 3   Continuous Glucose Monitor Sup MISC 1 Units by Does not apply route daily. 1 Units 0   Continuous Glucose Receiver DEVI 1 Units by Does not apply route daily. 1 Units 0   dextromethorphan-guaiFENesin (MUCINEX DM) 30-600 MG 12hr tablet Take 1 tablet by mouth daily as needed (Sinus Congestion).     ezetimibe  (ZETIA ) 10 MG tablet TAKE 1 TABLET(10 MG) BY MOUTH  DAILY 90 tablet 3   famotidine (PEPCID) 20 MG tablet Take 20 mg by mouth daily with lunch.     gabapentin  (NEURONTIN ) 100 MG capsule TAKE 1 CAPSULE(100 MG) BY MOUTH THREE TIMES DAILY (Patient taking differently: Take 100 mg by mouth at bedtime.) 90 capsule 3   glipiZIDE  (GLUCOTROL ) 10 MG tablet TAKE 1/2 TABLET BY MOUTH EVERY MORNING WITH BREAKFAST AND 1 TABLET EVERY EVENING WITH SUPPER. (Patient taking differently: Take 10 mg by mouth 2 (two) times daily before a meal.) 120 tablet 3   Glycerin-Hypromellose-PEG 400 (DRY EYE RELIEF DROPS) 0.2-0.2-1 % SOLN Place 1 drop into both eyes daily.     lisinopril  (ZESTRIL ) 10 MG tablet Take 10 mg by mouth daily.     midodrine  (PROAMATINE ) 5 MG tablet Take 1 tablet (5 mg total) by mouth 3 (three) times daily as needed. Take when BP does drop less than 110 when standing. Additional dose at 2 pm if still dropping below 110 when standing 90 tablet 1   Multiple Vitamin (MULTIVITAMIN) tablet Take 1 tablet by mouth daily with lunch.     Omega-3 Fatty Acids (FISH OIL PO) Take 1,500 mg by mouth daily with lunch.     ONETOUCH VERIO test strip USE TO CHECK BLOOD SUGAR TWICE DAILY AS DIRECTED 100 strip 1   pantoprazole  (PROTONIX ) 40 MG tablet Take 1 tablet (40 mg total) by mouth daily. 90 tablet 3   Psyllium 83 % POWD Take 15 mLs by mouth at bedtime.     rosuvastatin  (CRESTOR ) 10 MG tablet Take 10 mg by mouth every other day.     Semaglutide , 1 MG/DOSE, 4 MG/3ML SOPN Inject 1 mg as directed once a week. 3 mL 3   sodium chloride  (OCEAN) 0.65 % SOLN nasal spray Place 1 spray into both nostrils daily as needed for congestion (Sinus).     spironolactone  (ALDACTONE ) 25 MG tablet TAKE 1 TABLET(25 MG) BY MOUTH DAILY 90 tablet 3   tamsulosin  (FLOMAX ) 0.4 MG CAPS capsule TAKE ONE CAPSULE BY MOUTH DAILY 90 capsule 3   traMADol  (ULTRAM ) 50 MG tablet TAKE 1 TABLET BY MOUTH EVERY 12 HOURS AS NEEDED 60 tablet 2   No current facility-administered medications on file prior to visit.     Review of Systems  Constitutional:  Negative for chills and fever.  Respiratory:  Negative for cough.   Cardiovascular:  Negative for chest pain and palpitations.  Gastrointestinal:  Negative for nausea and vomiting.      Objective:    BP 136/84   Pulse 78   Temp 98 F (36.7 C) (Oral)   Ht 6' (1.829 m)   Wt 209 lb 12.8 oz (95.2 kg)   SpO2 98%   BMI 28.45 kg/m  BP Readings from Last 3 Encounters:  01/23/23 136/84  12/24/22 (!) 143/81  12/10/22 126/78   Wt Readings from Last 3 Encounters:  01/23/23 209 lb 12.8 oz (95.2 kg)  12/24/22 211 lb (95.7 kg)  12/10/22 211 lb 6 oz (95.9 kg)    Physical Exam Vitals reviewed.  Constitutional:      Appearance: He is well-developed.  Cardiovascular:     Rate and Rhythm: Regular rhythm.     Heart sounds: Normal heart sounds.  Pulmonary:     Effort: Pulmonary effort is normal. No respiratory distress.     Breath sounds: Normal breath sounds. No wheezing, rhonchi or rales.  Skin:    General: Skin is warm and dry.  Neurological:     Mental Status: He is alert.  Psychiatric:        Speech: Speech normal.        Behavior: Behavior normal.

## 2023-01-24 NOTE — Assessment & Plan Note (Signed)
 Lab Results  Component Value Date   HGBA1C 7.4 (A) 01/23/2023   Slight increase from prior.  Discussed slight constipation while on Ozempic  therefore we opted not to increase.  Discussed use of MiraLAX scheduled.  Continue Glipizide  10 mg twice daily, Ozempic  1 mg.  We discussed focusing on dietary modifications as opposed to escalating diabetic medicines.

## 2023-01-24 NOTE — Assessment & Plan Note (Addendum)
 Chronic, suboptimal control.  Discussed tramadol 50mg  BID is not particularly effective.  Long discussion in regards to scheduling Tylenol arthritis and appropriate doses.  Will continue to monitor liver enzymes

## 2023-02-24 ENCOUNTER — Ambulatory Visit: Payer: Medicare Other | Admitting: Cardiology

## 2023-02-25 ENCOUNTER — Ambulatory Visit (INDEPENDENT_AMBULATORY_CARE_PROVIDER_SITE_OTHER): Payer: Medicare Other

## 2023-02-25 DIAGNOSIS — I442 Atrioventricular block, complete: Secondary | ICD-10-CM

## 2023-02-25 DIAGNOSIS — I5022 Chronic systolic (congestive) heart failure: Secondary | ICD-10-CM

## 2023-02-25 NOTE — Progress Notes (Unsigned)
Cardiology Office Note Date:  02/26/2023  Patient ID:  [name] [name], [name] [name] February 10, 1940, MRN [id number] PCP:  Allegra Grana, [name]  Cardiologist:  Julien Nordmann, MD Electrophysiologist: Lanier Prude, MD   Chief Complaint: 6mon device follow-up  History of Present Illness: [name] [name] is a 83 y.o. male with PMH notable for HFrEF, VT, s/p CRT-D, CAD s/p PCI, LV mural thrombus; seen today for Lanier Prude, MD for routine electrophysiology followup.   Last saw Dr. Lalla Brothers 07/2021. VT has been quiescent on amiodarone. He does have phrenic stim with certain positions.   On follow-up today, he is a little concerned about his low BP reading today while being roomed for appointment. He denies dizziness, presyncope, or syncope. He overall has no cardiac complaints, denies palpitations, chest pain, chest pressure. He is minimally active throughout the day, but no SOB with activities. He denies increased lower extremity edema.       Device Information: Bos Sci VVI ICD 02/02/2020 RV lead revision 02/2020 Upgrade to CRT-D, imp 02/2021; dx ICM  AAD History: Amiodarone     ROS:  Please see the history of present illness. All other systems are reviewed and otherwise negative.   PHYSICAL EXAM:  VS:  BP 100/60 (BP Location: Left Arm, Patient Position: Sitting, Cuff Size: Normal)   Pulse 62   Ht 6' (1.829 m)   Wt 210 lb 8 oz (95.5 kg)   SpO2 96%   BMI 28.55 kg/m  BMI: Body mass index is 28.55 kg/m.  Wt Readings from Last 3 Encounters:  02/26/23 210 lb 8 oz (95.5 kg)  01/23/23 209 lb 12.8 oz (95.2 kg)  12/24/22 211 lb (95.7 kg)    GEN- The patient is well appearing, alert and oriented x 3 today.   Lungs- Clear to ausculation bilaterally, normal work of breathing.  Heart- Regular rate and rhythm, no murmurs, rubs or gallops Extremities- No peripheral edema, warm, dry Skin-   device pocket well-healed   Device interrogation done today and reviewed by myself:   Battery 10 years Lead thresholds, impedence, sensing stable  No episodes No changes made today   EKG is ordered. Personal review of EKG from today shows:  EKG Interpretation Date/Time:  Wednesday February 26 2023 10:10:38 EST Ventricular Rate:  64 PR Interval:  158 QRS Duration:  166 QT Interval:  498 QTC Calculation: 513 R Axis:   258  Text Interpretation: Atrial-sensed ventricular-paced rhythm Confirmed by Sherie Don [phone]) on 02/26/2023 10:13:12 AM     Additional studies reviewed include: Previous EP, cardiology notes.   R/LHC, 11/26/2022 Multivessel coronary artery disease, as detailed above, including heavily calcified and aneurysmal proximal/mid LAD.  There is a 50% stenosis in the mid to distal LAD.  Chronic total occlusion of the RCA again noted with the distal branches filling via left-to-right collaterals.  LCx is without significant disease.  Overall appearance is fairly stable compared to completion of LAD PCI in 2021. Widely patent mid LAD and ramus intermedius stents. Normal left heart filling pressure (PW 13 mmHg). Mildly elevated right heart and pulmonary artery pressures (mean RA 8, RVEDP 8, mean PA 23 mmHg).  TTE, 11/20/2022  1. Left ventricular ejection fraction, by estimation, is 40 to 45%. Left ventricular ejection fraction by 3D volume is 44 %. The left ventricle has mildly decreased function. The left ventricle demonstrates regional wall motion abnormalities (hypokinesis of the basal to mid inferior/inferolateral wall, seen previously 12/21). Left ventricular diastolic parameters are consistent with Grade I diastolic  dysfunction (impaired relaxation). The average left  ventricular global longitudinal strain is -13.5 %.   2. Right ventricular systolic function is normal. The right ventricular size is normal.   3. The mitral valve is normal in structure. No evidence of mitral valve regurgitation. No evidence of mitral stenosis.   4. The aortic valve is  normal in structure. There is moderate calcification of the aortic valve. Aortic valve regurgitation is not visualized. Moderate aortic valve stenosis. Aortic valve area, by VTI measures 0.92 cm. Aortic valve mean gradient measures 20.0 mmHg. Aortic valve Vmax measures 2.93 m/s.   5. The inferior vena cava is normal in size with greater than 50% respiratory variability, suggesting right atrial pressure of 3 mmHg.   TTE, 07/12/2021  1. Left ventricular ejection fraction, by estimation, is 55 to 60%. The left ventricle has normal function. The left ventricle has no regional wall motion abnormalities. Left ventricular diastolic parameters are consistent with Grade I diastolic dysfunction (impaired relaxation).   2. Right ventricular systolic function is normal. The right ventricular size is normal.   3. The mitral valve is normal in structure. No evidence of mitral valve regurgitation.   4. The aortic valve is tricuspid. Aortic valve regurgitation is not visualized. Mild to moderate aortic valve stenosis. Aortic valve mean gradient measures 18.0 mmHg. Aortic valve Vmax measures 2.50 m/s.   5. The inferior vena cava is normal in size with greater than 50% respiratory variability, suggesting right atrial pressure of 3 mmHg.   Comparison(s): 12/21/19-EF 35-40%.   BiV Upgrade, 03/08/2021 1. Venogram of the LUE demonstrating patent left subclavian/axillary venous system. 2. Successful upgrade of a VVI ICD to a CRT-D with the implant of a CS and RA lead 3. No early apparent complications.   TTE, 12/21/2019  1. Left ventricular ejection fraction, by estimation, is 35 to 40%. The left ventricle has moderately decreased function. The left ventricle demonstrates regional wall motion abnormalities (see scoring diagram/findings for description). The left ventricular internal cavity size was mildly dilated. There is mild asymmetric left ventricular hypertrophy of the septal segment. Left ventricular diastolic  parameters are indeterminate. There is akinesis of the left ventricular, basal-mid inferior wall and inferolateral wall.   2. Right ventricular systolic function is normal. The right ventricular size is mildly enlarged.   3. The mitral valve is normal in structure. Trivial mitral valve regurgitation.   4. The aortic valve is tricuspid. There is moderate calcification of the aortic valve. There is moderate thickening of the aortic valve. Aortic valve regurgitation is not visualized. Mild aortic valve stenosis. Aortic valve mean gradient measures 14.0 mmHg.   5. The inferior vena cava is normal in size with greater than 50% respiratory variability, suggesting right atrial pressure of 3 mmHg.   Conclusion(s)/Recommendation(s): No left ventricular mural or apical thrombus/thrombi.    ASSESSMENT AND PLAN:  #) VT #) high risk medication use - amiodarone VT quiescent by device Tolerating amiodarone well Continue amiodarone 200mg  daily  Due for amio labs at next follow-up Continue coreg 3.125mg  BID   #) CRT-D in situ Device functioning well, see paceart for details 100% VP    #) HFmrEF #) CAD s/p PCI Most recent TTE with reduced LVEF, LHC with stable CAD Warm and euvolemic on exam Low HeartLogic score of 0       Current medicines are reviewed at length with the patient today.   The patient does not have concerns regarding his medicines.  The following changes were made today:  none  Labs/ tests ordered today include:  Orders Placed This Encounter  Procedures   EKG 12-Lead   EKG 12-Lead     Disposition: Follow up with Dr. Lalla Brothers or EP APP in 6 months Follow up with Dr. Mariah Milling or gen cards APP 2-3 months    Signed, Sherie Don, NP  02/26/23  11:52 AM  Electrophysiology [facility]

## 2023-02-26 ENCOUNTER — Encounter: Payer: Self-pay | Admitting: Cardiology

## 2023-02-26 ENCOUNTER — Ambulatory Visit: Payer: Medicare Other | Attending: Cardiology | Admitting: Cardiology

## 2023-02-26 ENCOUNTER — Ambulatory Visit: Payer: Medicare Other | Admitting: Dermatology

## 2023-02-26 VITALS — BP 100/60 | HR 62 | Ht 72.0 in | Wt 210.5 lb

## 2023-02-26 DIAGNOSIS — Z95810 Presence of automatic (implantable) cardiac defibrillator: Secondary | ICD-10-CM | POA: Diagnosis not present

## 2023-02-26 DIAGNOSIS — I5022 Chronic systolic (congestive) heart failure: Secondary | ICD-10-CM

## 2023-02-26 DIAGNOSIS — I472 Ventricular tachycardia, unspecified: Secondary | ICD-10-CM

## 2023-02-26 DIAGNOSIS — Z5181 Encounter for therapeutic drug level monitoring: Secondary | ICD-10-CM

## 2023-02-26 DIAGNOSIS — Z79899 Other long term (current) drug therapy: Secondary | ICD-10-CM

## 2023-02-26 LAB — CUP PACEART REMOTE DEVICE CHECK
Battery Remaining Longevity: 120 mo
Battery Remaining Percentage: 100 %
Brady Statistic RA Percent Paced: 9 %
Brady Statistic RV Percent Paced: 100 %
Date Time Interrogation Session: 20250211004700
HighPow Impedance: 77 Ohm
Implantable Lead Connection Status: 753985
Implantable Lead Connection Status: 753985
Implantable Lead Connection Status: 753985
Implantable Lead Implant Date: 20220202
Implantable Lead Implant Date: 20230213
Implantable Lead Implant Date: 20230213
Implantable Lead Location: 753858
Implantable Lead Location: 753859
Implantable Lead Location: 753860
Implantable Lead Model: 273
Implantable Lead Model: 4671
Implantable Lead Model: 7841
Implantable Lead Serial Number: 111255
Implantable Lead Serial Number: 1233258
Implantable Lead Serial Number: 861310
Implantable Pulse Generator Implant Date: 20230213
Lead Channel Impedance Value: 392 Ohm
Lead Channel Impedance Value: 581 Ohm
Lead Channel Impedance Value: 815 Ohm
Lead Channel Pacing Threshold Amplitude: 0.9 V
Lead Channel Pacing Threshold Amplitude: 1.1 V
Lead Channel Pacing Threshold Pulse Width: 0.4 ms
Lead Channel Pacing Threshold Pulse Width: 0.4 ms
Lead Channel Setting Pacing Amplitude: 1.9 V
Lead Channel Setting Pacing Amplitude: 2 V
Lead Channel Setting Pacing Amplitude: 2 V
Lead Channel Setting Pacing Pulse Width: 0.4 ms
Lead Channel Setting Pacing Pulse Width: 0.4 ms
Lead Channel Setting Sensing Sensitivity: 0.6 mV
Lead Channel Setting Sensing Sensitivity: 1 mV
Pulse Gen Serial Number: 279753

## 2023-02-26 LAB — CUP PACEART INCLINIC DEVICE CHECK
Date Time Interrogation Session: 20250212120351
HighPow Impedance: 74 Ohm
Implantable Lead Connection Status: 753985
Implantable Lead Connection Status: 753985
Implantable Lead Connection Status: 753985
Implantable Lead Implant Date: 20220202
Implantable Lead Implant Date: 20230213
Implantable Lead Implant Date: 20230213
Implantable Lead Location: 753858
Implantable Lead Location: 753859
Implantable Lead Location: 753860
Implantable Lead Model: 273
Implantable Lead Model: 4671
Implantable Lead Model: 7841
Implantable Lead Serial Number: 111255
Implantable Lead Serial Number: 1233258
Implantable Lead Serial Number: 861310
Implantable Pulse Generator Implant Date: 20230213
Lead Channel Impedance Value: 397 Ohm
Lead Channel Impedance Value: 575 Ohm
Lead Channel Impedance Value: 831 Ohm
Lead Channel Pacing Threshold Amplitude: 0.8 V
Lead Channel Pacing Threshold Amplitude: 0.9 V
Lead Channel Pacing Threshold Amplitude: 1.1 V
Lead Channel Pacing Threshold Pulse Width: 0.4 ms
Lead Channel Pacing Threshold Pulse Width: 0.4 ms
Lead Channel Pacing Threshold Pulse Width: 0.4 ms
Lead Channel Sensing Intrinsic Amplitude: 1.3 mV
Lead Channel Sensing Intrinsic Amplitude: 18 mV
Lead Channel Sensing Intrinsic Amplitude: 4.2 mV
Lead Channel Setting Pacing Amplitude: 1.9 V
Lead Channel Setting Pacing Amplitude: 2 V
Lead Channel Setting Pacing Amplitude: 2 V
Lead Channel Setting Pacing Pulse Width: 0.4 ms
Lead Channel Setting Pacing Pulse Width: 0.4 ms
Lead Channel Setting Sensing Sensitivity: 0.6 mV
Lead Channel Setting Sensing Sensitivity: 1 mV
Pulse Gen Serial Number: 279753

## 2023-02-26 NOTE — Patient Instructions (Signed)
Medication Instructions:  The current medical regimen is effective;  continue present plan and medications.  *If you need a refill on your cardiac medications before your next appointment, please call your pharmacy*   Follow-Up: At [facility], you and your health needs are our priority.  As part of our continuing mission to provide you with exceptional heart care, we have created designated Provider Care Teams.  These Care Teams include your primary Cardiologist (physician) and Advanced Practice Providers (APPs -  Physician Assistants and Nurse Practitioners) who all work together to provide you with the care you need, when you need it.  We recommend signing up for the patient portal called "MyChart".  Sign up information is provided on this After Visit Summary.  MyChart is used to connect with patients for Virtual Visits (Telemedicine).  Patients are able to view lab/test results, encounter notes, upcoming appointments, etc.  Non-urgent messages can be sent to your provider as well.   To learn more about what you can do with MyChart, go to [email or web address].    Your next appointment:   2-3 months with Dr.[name] 6 months with Dr.Lambert or Sherie Don, NP

## 2023-02-27 ENCOUNTER — Encounter: Payer: Self-pay | Admitting: Cardiology

## 2023-03-01 ENCOUNTER — Encounter: Payer: Self-pay | Admitting: Cardiology

## 2023-03-04 ENCOUNTER — Ambulatory Visit: Payer: Medicare Other | Admitting: Dermatology

## 2023-03-04 ENCOUNTER — Encounter: Payer: Self-pay | Admitting: Dermatology

## 2023-03-04 DIAGNOSIS — L570 Actinic keratosis: Secondary | ICD-10-CM | POA: Diagnosis not present

## 2023-03-04 DIAGNOSIS — L578 Other skin changes due to chronic exposure to nonionizing radiation: Secondary | ICD-10-CM

## 2023-03-04 DIAGNOSIS — W908XXA Exposure to other nonionizing radiation, initial encounter: Secondary | ICD-10-CM | POA: Diagnosis not present

## 2023-03-04 DIAGNOSIS — Z79899 Other long term (current) drug therapy: Secondary | ICD-10-CM

## 2023-03-04 DIAGNOSIS — L821 Other seborrheic keratosis: Secondary | ICD-10-CM | POA: Diagnosis not present

## 2023-03-04 DIAGNOSIS — Z5111 Encounter for antineoplastic chemotherapy: Secondary | ICD-10-CM | POA: Diagnosis not present

## 2023-03-04 DIAGNOSIS — L820 Inflamed seborrheic keratosis: Secondary | ICD-10-CM | POA: Diagnosis not present

## 2023-03-04 DIAGNOSIS — Z872 Personal history of diseases of the skin and subcutaneous tissue: Secondary | ICD-10-CM

## 2023-03-04 DIAGNOSIS — Z7189 Other specified counseling: Secondary | ICD-10-CM

## 2023-03-04 NOTE — Progress Notes (Signed)
Follow-Up Visit   Subjective  [name] [name] is a 83 y.o. male who presents for the following: here for ak and isk follow up, hx of spots at scalp, face, ears, and hands.  Patient has reports some areas at scalp  The patient has spots, moles and lesions to be evaluated, some may be new or changing and the patient may have concern these could be cancer.   The following portions of the chart were reviewed this encounter and updated as appropriate: medications, allergies, medical history  Review of Systems:  No other skin or systemic complaints except as noted in HPI or Assessment and Plan.  Objective  Well appearing patient in no apparent distress; mood and affect are within normal limits.  A focused examination was performed of the following areas: Scalp, face, ears, hands  Relevant exam findings are noted in the Assessment and Plan.  scalp x 17 (17) Erythematous thin papules/macules with gritty scale.  Scalp x 3 (3) Erythematous stuck-on, waxy papule or plaque  Assessment & Plan   SEBORRHEIC KERATOSIS - Stuck-on, waxy, tan-brown papules and/or plaques  - Benign-appearing - Discussed benign etiology and prognosis. - Observe - Call for any changes  ACTINIC KERATOSIS (17) scalp x 17 (17) Restart  in 1 month 5-fluorouracil/calcipotriene cream twice a day for 10 days to affected areas including scalp.  Reviewed course of treatment and expected reaction.  Patient advised to expect inflammation and crusting and advised that erosions are possible.  Patient advised to be diligent with sun protection during and after treatment. Counseled to keep medication out of reach of children and pets.  ACTINIC DAMAGE WITH PRECANCEROUS ACTINIC KERATOSES Counseling for Topical Chemotherapy Management: Patient exhibits: - Severe, confluent actinic changes with pre-cancerous actinic keratoses that is secondary to cumulative UV radiation exposure over time - Condition that is severe; chronic, not  at goal. - diffuse scaly erythematous macules and papules with underlying dyspigmentation - Discussed Prescription "Field Treatment" topical Chemotherapy for Severe, Chronic Confluent Actinic Changes with Pre-Cancerous Actinic Keratoses Field treatment involves treatment of an entire area of skin that has confluent Actinic Changes (Sun/ Ultraviolet light damage) and PreCancerous Actinic Keratoses by method of PhotoDynamic Therapy (PDT) and/or prescription Topical Chemotherapy agents such as 5-fluorouracil, 5-fluorouracil/calcipotriene, and/or imiquimod.  The purpose is to decrease the number of clinically evident and subclinical PreCancerous lesions to prevent progression to development of skin cancer by chemically destroying early precancer changes that may or may not be visible.  It has been shown to reduce the risk of developing skin cancer in the treated area. As a result of treatment, redness, scaling, crusting, and open sores may occur during treatment course. One or more than one of these methods may be used and may have to be used several times to control, suppress and eliminate the PreCancerous changes. Discussed treatment course, expected reaction, and possible side effects. - Recommend daily broad spectrum sunscreen SPF 30+ to sun-exposed areas, reapply every 2 hours as needed.  - Staying in the shade or wearing long sleeves, sun glasses (UVA+UVB protection) and wide brim hats (4-inch brim around the entire circumference of the hat) are also recommended. - Call for new or changing lesions.  Actinic keratoses are precancerous spots that appear secondary to cumulative UV radiation exposure/sun exposure over time. They are chronic with expected duration over 1 year. A portion of actinic keratoses will progress to squamous cell carcinoma of the skin. It is not possible to reliably predict which spots will progress to skin cancer  and so treatment is recommended to prevent development of skin  cancer.  Recommend daily broad spectrum sunscreen SPF 30+ to sun-exposed areas, reapply every 2 hours as needed.  Recommend staying in the shade or wearing long sleeves, sun glasses (UVA+UVB protection) and wide brim hats (4-inch brim around the entire circumference of the hat). Call for new or changing lesions. Destruction of lesion - scalp x 17 (17) Complexity: simple   Destruction method: cryotherapy   Informed consent: discussed and consent obtained   Timeout:  patient name, date of birth, surgical site, and procedure verified Lesion destroyed using liquid nitrogen: Yes   Region frozen until ice ball extended beyond lesion: Yes   Outcome: patient tolerated procedure well with no complications   Post-procedure details: wound care instructions given   INFLAMED SEBORRHEIC KERATOSIS (3) Scalp x 3 (3) Symptomatic, irritating, patient would like treated. Destruction of lesion - Scalp x 3 (3) Complexity: simple   Destruction method: cryotherapy   Informed consent: discussed and consent obtained   Timeout:  patient name, date of birth, surgical site, and procedure verified Lesion destroyed using liquid nitrogen: Yes   Region frozen until ice ball extended beyond lesion: Yes   Outcome: patient tolerated procedure well with no complications   Post-procedure details: wound care instructions given    Return in about 1 year (around 03/03/2024) for TBSE hx of melanoma .  IAsher Muir, CMA, am acting as scribe for Armida Sans, MD.   Documentation: I have reviewed the above documentation for accuracy and completeness, and I agree with the above.  Armida Sans, MD

## 2023-03-04 NOTE — Patient Instructions (Addendum)
Restart  in 1 month 5-fluorouracil/calcipotriene cream twice a day for 10 days to affected areas including scalp.  Reviewed course of treatment and expected reaction.  Patient advised to expect inflammation and crusting and advised that erosions are possible.  Patient advised to be diligent with sun protection during and after treatment. Counseled to keep medication out of reach of children and pets.   5-Fluorouracil/Calcipotriene Patient Education   Actinic keratoses are the dry, red scaly spots on the skin caused by sun damage. A portion of these spots can turn into skin cancer with time, and treating them can help prevent development of skin cancer.   Treatment of these spots requires removal of the defective skin cells. There are various ways to remove actinic keratoses, including freezing with liquid nitrogen, treatment with creams, or treatment with a blue light procedure in the office.   5-fluorouracil cream is a topical cream used to treat actinic keratoses. It works by interfering with the growth of abnormal fast-growing skin cells, such as actinic keratoses. These cells peel off and are replaced by healthy ones.   5-fluorouracil/calcipotriene is a combination of the 5-fluorouracil cream with a vitamin D analog cream called calcipotriene. The calcipotriene alone does not treat actinic keratoses. However, when it is combined with 5-fluorouracil, it helps the 5-fluorouracil treat the actinic keratoses much faster so that the same results can be achieved with a much shorter treatment time.  INSTRUCTIONS FOR 5-FLUOROURACIL/CALCIPOTRIENE CREAM:   5-fluorouracil/calcipotriene cream typically only needs to be used for 4-7 days. A thin layer should be applied twice a day to the treatment areas recommended by your physician.   If your physician prescribed you separate tubes of 5-fluourouracil and calcipotriene, apply a thin layer of 5-fluorouracil followed by a thin layer of calcipotriene.    Avoid contact with your eyes, nostrils, and mouth. Do not use 5-fluorouracil/calcipotriene cream on infected or open wounds.   You will develop redness, irritation and some crusting at areas where you have pre-cancer damage/actinic keratoses. IF YOU DEVELOP PAIN, BLEEDING, OR SIGNIFICANT CRUSTING, STOP THE TREATMENT EARLY - you have already gotten a good response and the actinic keratoses should clear up well.  Wash your hands after applying 5-fluorouracil 5% cream on your skin.   A moisturizer or sunscreen with a minimum SPF 30 should be applied each morning.   Once you have finished the treatment, you can apply a thin layer of Vaseline twice a day to irritated areas to soothe and calm the areas more quickly. If you experience significant discomfort, contact your physician.  For some patients it is necessary to repeat the treatment for best results.  SIDE EFFECTS: When using 5-fluorouracil/calcipotriene cream, you may have mild irritation, such as redness, dryness, swelling, or a mild burning sensation. This usually resolves within 2 weeks. The more actinic keratoses you have, the more redness and inflammation you can expect during treatment. Eye irritation has been reported rarely. If this occurs, please let us know.  If you have any trouble using this cream, please call the office. If you have any other questions about this information, please do not hesitate to ask me before you leave the office.    Actinic keratoses are precancerous spots that appear secondary to cumulative UV radiation exposure/sun exposure over time. They are chronic with expected duration over 1 year. A portion of actinic keratoses will progress to squamous cell carcinoma of the skin. It is not possible to reliably predict which spots will progress to skin cancer and so  treatment is recommended to prevent development of skin cancer.  Recommend daily broad spectrum sunscreen SPF 30+ to sun-exposed areas, reapply every 2  hours as needed.  Recommend staying in the shade or wearing long sleeves, sun glasses (UVA+UVB protection) and wide brim hats (4-inch brim around the entire circumference of the hat). Call for new or changing lesions.    Cryotherapy Aftercare  Wash gently with soap and water everyday.   Apply Vaseline and Band-Aid daily until healed.       Due to recent changes in healthcare laws, you may see results of your pathology and/or laboratory studies on MyChart before the doctors have had a chance to review them. We understand that in some cases there may be results that are confusing or concerning to you. Please understand that not all results are received at the same time and often the doctors may need to interpret multiple results in order to provide you with the best plan of care or course of treatment. Therefore, we ask that you please give [place] 2 business days to thoroughly review all your results before contacting the office for clarification. Should we see a critical lab result, you will be contacted sooner.   If You Need Anything After Your Visit  If you have any questions or concerns for your doctor, please call our main line at [phone] and press option 4 to reach your doctor's medical assistant. If no one answers, please leave a voicemail as directed and we will return your call as soon as possible. Messages left after 4 pm will be answered the following business day.   You may also send [place] a message via MyChart. We typically respond to MyChart messages within 1-2 business days.  For prescription refills, please ask your pharmacy to contact our office. Our fax number is [phone].  If you have an urgent issue when the clinic is closed that cannot wait until the next business day, you can page your doctor at the number below.    Please note that while we do our best to be available for urgent issues outside of office hours, we are not available 24/7.   If you have an urgent  issue and are unable to reach [place], you may choose to seek medical care at your doctor's office, retail clinic, urgent care center, or emergency room.  If you have a medical emergency, please immediately call 911 or go to the emergency department.  Pager Numbers  - Dr. Gwen Pounds: [phone]  - Dr. Roseanne Reno: [phone]  - Dr. Katrinka Blazing: [phone]   In the event of inclement weather, please call our main line at [phone] for an update on the status of any delays or closures.  Dermatology Medication Tips: Please keep the boxes that topical medications come in in order to help keep track of the instructions about where and how to use these. Pharmacies typically print the medication instructions only on the boxes and not directly on the medication tubes.   If your medication is too expensive, please contact our office at [phone] option 4 or send [place] a message through MyChart.   We are unable to tell what your co-pay for medications will be in advance as this is different depending on your insurance coverage. However, we may be able to find a substitute medication at lower cost or fill out paperwork to get insurance to cover a needed medication.   If a prior authorization is required to get your medication covered by your insurance company, please  allow [place] 1-2 business days to complete this process.  Drug prices often vary depending on where the prescription is filled and some pharmacies may offer cheaper prices.  The website www.goodrx.com contains coupons for medications through different pharmacies. The prices here do not account for what the cost may be with help from insurance (it may be cheaper with your insurance), but the website can give you the price if you did not use any insurance.  - You can print the associated coupon and take it with your prescription to the pharmacy.  - You may also stop by our office during regular business hours and pick up a GoodRx coupon card.  - If  you need your prescription sent electronically to a different pharmacy, notify our office through [facility] or by phone at [phone] option 4.     Si Usted Necesita Algo Despus de Su Visita  Tambin puede enviarnos un mensaje a travs de Clinical cytogeneticist. Por lo general respondemos a los mensajes de MyChart en el transcurso de 1 a 2 das hbiles.  Para renovar recetas, por favor pida a su farmacia que se ponga en contacto con nuestra oficina. Annie Sable de fax es [place] [phone].  Si tiene un asunto urgente cuando la clnica est cerrada y que no puede esperar hasta el siguiente da hbil, puede llamar/localizar a su doctor(a) al nmero que aparece a continuacin.   Por favor, tenga en cuenta que aunque hacemos todo lo posible para estar disponibles para asuntos urgentes fuera del horario de [place], no estamos disponibles las 24 horas del da, los 7 [address]  [address] de la [place].   Si tiene un problema urgente y no puede comunicarse con nosotros, puede optar por buscar atencin mdica  en el consultorio de su doctor(a), en una clnica privada, en un centro de atencin urgente o en una sala de emergencias.  Si tiene Engineer, drilling, por favor llame inmediatamente al 911 o vaya a la sala de emergencias.  Nmeros de bper  - Dr. Gwen Pounds: [phone]  - Dra. Roseanne Reno: [phone]  - Dr. Katrinka Blazing: [phone]   En caso de inclemencias del tiempo, por favor llame a Lacy Duverney principal al [phone] para una actualizacin sobre el [place] de cualquier retraso o cierre.  Consejos para la medicacin en dermatologa: Por favor, guarde las cajas en las que vienen los medicamentos de uso tpico para ayudarle a seguir las instrucciones sobre dnde y cmo usarlos. Las farmacias generalmente imprimen las instrucciones del medicamento slo en las cajas y no directamente en los tubos del [place].   Si su medicamento es muy caro, por favor, pngase en contacto con Rolm Gala llamando  al [phone] y presione la opcin 4 o envenos un mensaje a travs de Clinical cytogeneticist.   No podemos decirle cul ser su copago por los medicamentos por adelantado ya que esto es diferente dependiendo de la cobertura de su seguro. Sin embargo, es posible que podamos encontrar un medicamento sustituto a Audiological scientist un formulario para que el seguro cubra el medicamento que se considera necesario.   Si se requiere una autorizacin previa para que su compaa de seguros [place] su medicamento, por favor permtanos de 1 a 2 das hbiles para completar [address]  Los precios de los medicamentos varan con frecuencia dependiendo del Environmental consultant de dnde se surte la receta y alguna farmacias pueden ofrecer precios ms baratos.  El sitio web www.goodrx.com tiene cupones para medicamentos de Health and safety inspector. Los precios aqu no tienen en cuenta lo  que podra costar con la ayuda del seguro (puede ser ms barato con su seguro), pero el sitio web puede darle el precio si no Visual merchandiser.  - Puede imprimir el cupn correspondiente y llevarlo con su receta a la farmacia.  - Tambin puede pasar por nuestra oficina durante el horario de atencin regular y Education officer, museum una tarjeta de cupones de GoodRx.  - Si necesita que su receta se enve electrnicamente a una farmacia diferente, informe a nuestra oficina a travs de MyChart de [place] o por telfono llamando al [phone] y presione la opcin 4.

## 2023-03-10 ENCOUNTER — Ambulatory Visit: Payer: Medicare Other | Admitting: Podiatry

## 2023-03-10 ENCOUNTER — Encounter: Payer: Self-pay | Admitting: Podiatry

## 2023-03-10 VITALS — Ht 72.0 in | Wt 210.5 lb

## 2023-03-10 DIAGNOSIS — B351 Tinea unguium: Secondary | ICD-10-CM

## 2023-03-10 DIAGNOSIS — M79675 Pain in left toe(s): Secondary | ICD-10-CM | POA: Diagnosis not present

## 2023-03-10 DIAGNOSIS — L600 Ingrowing nail: Secondary | ICD-10-CM

## 2023-03-10 DIAGNOSIS — M79674 Pain in right toe(s): Secondary | ICD-10-CM | POA: Diagnosis not present

## 2023-03-10 DIAGNOSIS — E1142 Type 2 diabetes mellitus with diabetic polyneuropathy: Secondary | ICD-10-CM | POA: Diagnosis not present

## 2023-03-10 NOTE — Progress Notes (Signed)
 Subjective:  Patient ID: [name] [name], male    DOB: Sep 29, 1940,  MRN: [id number]  83 y.o. male presents at risk foot care with history of diabetic neuropathy and painful mycotic toenails x 10 which interfere with daily activities. Pain is relieved with periodic professional debridement. Patient states his right great toe is sore. Chief Complaint  Patient presents with   Nail Problem    Pt is here for [facility] laxt A1C was 7.1 PCP is Dr Jason Coop and LOV was in January.   PCP is [name], Lyn Records, [name].  Allergies  Allergen Reactions   Metformin And Related Diarrhea and Other (See Comments)    Leg cramps, also   Azithromycin Other (See Comments)    Not recommended for cardiac patients- no reaction   Other Other (See Comments)    If taking antibiotics for awhile, thrush results   Percocet [Oxycodone-Acetaminophen] Other (See Comments)    Hallucinations   Jardiance [Empagliflozin] Other (See Comments)    Tongue itching/ Thrush reaction    Review of Systems: Negative except as noted in the HPI.   Objective:  [name] [name] is a pleasant 83 y.o. male in NAD. AAO x 3.  Vascular Examination: Vascular status intact b/l with palpable pedal pulses. CFT immediate b/l. Pedal hair present. No edema. No pain with calf compression b/l. Skin temperature gradient WNL b/l. No varicosities noted. No cyanosis or clubbing noted.  Neurological Examination: Pt has subjective symptoms of neuropathy. Sensation grossly intact b/l with 10 gram monofilament. Vibratory sensation intact b/l.  Dermatological Examination: Pedal skin with normal turgor, texture and tone b/l. No open wounds nor interdigital macerations noted. Toenails 1-5 b/l thick, discolored, elongated with subungual debris and pain on dorsal palpation.   Incurvated nailplate medial border right hallux.  Nail border hypertrophy absent. There is tenderness to palpation. Sign(s) of infection: no clinical signs of infection noted on examination  today.. No corns, calluses nor porokeratotic lesions noted.  Musculoskeletal Examination: Muscle strength 5/5 to b/l LE.  No pain, crepitus noted b/l. Hammertoe deformity noted 2-5 b/l.  Radiographs: None  Last A1c:      Latest Ref Rng & Units 01/23/2023   11:03 AM 10/23/2022    9:43 AM 05/29/2022   10:08 AM  Hemoglobin A1C  Hemoglobin-A1c 4.0 - 5.6 % 7.4  6.9  6.8    Assessment:   1. Pain due to onychomycosis of toenails of both feet   2. Ingrown toenail without infection   3. Diabetic polyneuropathy associated with type 2 diabetes mellitus (HCC)    Plan:  -Consent given for treatment as described below: -Examined patient. -Toenails were debrided in length and girth 2-5 bilaterally and left great toe with sterile nail nippers and dremel without iatrogenic bleeding.  -No invasive procedure(s) performed. Offending nail border debrided and curretaged medial border right hallux utilizing sterile nail nipper and currette. Border(s) cleansed with alcohol and triple antibiotic ointment applied. Dispensed written instructions for once daily epsom salt soaks for 1 days. He may apply TAO to toe once daily for one week. Call office if there are any concerns. -Patient/POA to call should there be question/concern in the interim.  Return in about 3 months (around 06/07/2023).  Freddie Breech, DPM      [place] LOCATION: [address]  [place], [place] [address]                   Office [phone]   [facility] LOCATION: [address] [place], [place] [address] Office [phone]

## 2023-03-14 ENCOUNTER — Other Ambulatory Visit: Payer: Self-pay | Admitting: Cardiovascular Disease

## 2023-03-18 ENCOUNTER — Telehealth: Payer: Self-pay

## 2023-03-18 NOTE — Telephone Encounter (Signed)
 Notified pt that Ozempic was ready for pick up. Pt verbalized understanding,.  Ozempic  Exp: 10/13/25 Lot: [id number]

## 2023-03-23 ENCOUNTER — Other Ambulatory Visit: Payer: Self-pay | Admitting: Family

## 2023-03-23 DIAGNOSIS — E1165 Type 2 diabetes mellitus with hyperglycemia: Secondary | ICD-10-CM

## 2023-04-07 ENCOUNTER — Other Ambulatory Visit: Payer: Self-pay | Admitting: Family

## 2023-04-07 DIAGNOSIS — G8929 Other chronic pain: Secondary | ICD-10-CM

## 2023-04-07 NOTE — Progress Notes (Signed)
 Remote ICD transmission.

## 2023-04-07 NOTE — Addendum Note (Signed)
 Addended by: Geralyn Flash D on: 04/07/2023 02:17 PM   Modules accepted: Orders

## 2023-04-09 NOTE — Progress Notes (Unsigned)
 Referring Physician:  Allegra Grana, [name] [address] [place],  [place] [address]  Primary Physician:  Allegra Grana, [name]  History of Present Illness: [name] has a history of CAD, DM, GERD, heart murmur, MI, hyperlipidemia, HTN, and melanoma.   Last seen by me on 08/12/22- his LBP and right leg pain were improved after PT. He has known slip at L4-L5 mild central stenosis and moderate bilateral foraminal stenosis. Also with bilateral foraminal stenosis at L3-L4 as well.   He is here for follow up.   He has constant LBP with bilateral leg pain lateral to his knees x years that has been worse in last 2 months. Pain is worse at the end of the day and at night. Pain is also worse with stairs and walking. Pain is better with sitting. He has weakness in his legs. No numbness or tingling.   He has diabetic neuropathy in both feet.   He is on PLAVIX. Has defibrillator and pacemaker. He is back to doing his HEP from PT- some of them made his pain worse.   Smokes occasional pipe.   No bowel or bladder issues.   Conservative measures:  Physical therapy: 7 visits of PT at [facility] with 80% improvement he was discharged on 08/01/22 Multimodal medical therapy including regular antiinflammatories: neurontin, ultram  Injections: one lumbar injection about 10 years ago - "very helpful"  Past Surgery: no prior spine surgery  [name] [name] has no symptoms of cervical myelopathy.  The symptoms are causing a significant impact on the patient's life.   Review of Systems:  A 10 point review of systems is negative, except for the pertinent positives and negatives detailed in the HPI.  Past Medical History: Past Medical History:  Diagnosis Date   Aortic insufficiency    a. noted on TTE 2015; b. 06/2019 Echo: AI not visualized.   Arthritis    CAD (coronary artery disease)    a. remote PCI in 1991 and 2005; b. MV 3/15: old inferior MI, no ischemia, LVEF 50%,  slight inferior wall hypokniesis; c. 06/2019 NSTEMI/PCI: LM nl, LAD 80p/m (Atherectomy & 4.5x18 Resolute Onyx DES), 73m/d, D1 75 (PTCA), RI patent stent, LCX nl, RCA 100p, RPAV fills via L->R collats from LCX.   Chicken pox    Colon polyps    4 pre-cancerous    Diverticulitis    DM type 2 (diabetes mellitus, type 2) (HCC)    Family history of adverse reaction to anesthesia    GERD (gastroesophageal reflux disease)    Heart murmur    History of kidney stones    HOH (hard of hearing)    Hyperlipidemia    Hypertension    Ischemic cardiomyopathy    a. TTE 2015: EF  50-55%, mild global HK; b. 06/2019 Echo: EF 45-50%, Gr1 DD, basal inf AK. Triv MR.   Kidney stones    Melanoma (HCC) 1980   Resected from his back   Mitral regurgitation    a. noted on TTE 2015   Myocardial infarction [facility])     Past Surgical History: Past Surgical History:  Procedure Laterality Date   BIV UPGRADE N/A 02/26/2021   Procedure: BIV ICD UPGRADE;  Surgeon: Lanier Prude, MD;  Location: [facility] INVASIVE CV LAB;  Service: Cardiovascular;  Laterality: N/A;   CHOLECYSTECTOMY  2012   COLONOSCOPY     in 2003 with polyp removed and leak anastomosis had to have open abdominal surgery    CORONARY ANGIOPLASTY  WITH STENT PLACEMENT  1991 & 2005   CORONARY ATHERECTOMY N/A 07/08/2019   Procedure: CORONARY ATHERECTOMY;  Surgeon: Corky Crafts, MD;  Location: [facility] INVASIVE CV LAB;  Service: Cardiovascular;  Laterality: N/A;   CORONARY BALLOON ANGIOPLASTY N/A 07/08/2019   Procedure: CORONARY BALLOON ANGIOPLASTY;  Surgeon: Corky Crafts, MD;  Location: [facility] INVASIVE CV LAB;  Service: Cardiovascular;  Laterality: N/A;  diagonal    CORONARY PRESSURE/FFR STUDY N/A 07/07/2019   Procedure: INTRAVASCULAR PRESSURE WIRE/FFR STUDY;  Surgeon: Yvonne Kendall, MD;  Location: [facility] INVASIVE CV LAB;  Service: Cardiovascular;  Laterality: N/A;   CORONARY STENT INTERVENTION N/A 07/08/2019   Procedure: CORONARY STENT INTERVENTION;   Surgeon: Corky Crafts, MD;  Location: [facility] INVASIVE CV LAB;  Service: Cardiovascular;  Laterality: N/A;  lad   CORONARY ULTRASOUND/IVUS N/A 07/08/2019   Procedure: Intravascular Ultrasound/IVUS;  Surgeon: Corky Crafts, MD;  Location: [facility] INVASIVE CV LAB;  Service: Cardiovascular;  Laterality: N/A;   CYSTOSCOPY/URETEROSCOPY/HOLMIUM LASER/STENT PLACEMENT Right 01/02/2018   Procedure: CYSTOSCOPY/URETEROSCOPY/HOLMIUM LASER/STENT PLACEMENT;  Surgeon: Sondra Come, MD;  Location: [facility] ORS;  Service: Urology;  Laterality: Right;   ESOPHAGOGASTRODUODENOSCOPY (EGD) WITH PROPOFOL N/A 11/19/2016   Procedure: ESOPHAGOGASTRODUODENOSCOPY (EGD) WITH PROPOFOL;  Surgeon: Midge Minium, MD;  Location: [facility] ENDOSCOPY;  Service: Endoscopy;  Laterality: N/A;   ESOPHAGOGASTRODUODENOSCOPY (EGD) WITH PROPOFOL N/A 02/02/2019   Procedure: ESOPHAGOGASTRODUODENOSCOPY (EGD) WITH PROPOFOL;  Surgeon: Midge Minium, MD;  Location: [facility] ENDOSCOPY;  Service: Endoscopy;  Laterality: N/A;   ICD IMPLANT N/A 02/02/2020   Procedure: ICD IMPLANT;  Surgeon: Lanier Prude, MD;  Location: [facility] INVASIVE CV LAB;  Service: Cardiovascular;  Laterality: N/A;   LEAD REVISION/REPAIR N/A 02/16/2020   Procedure: LEAD REVISION/REPAIR;  Surgeon: Regan Lemming, MD;  Location: [facility] INVASIVE CV LAB;  Service: Cardiovascular;  Laterality: N/A;   LEFT HEART CATH N/A 07/08/2019   Procedure: Left Heart Cath;  Surgeon: Corky Crafts, MD;  Location: [facility] INVASIVE CV LAB;  Service: Cardiovascular;  Laterality: N/A;   LEFT HEART CATH AND CORONARY ANGIOGRAPHY N/A 07/07/2019   Procedure: LEFT HEART CATH AND CORONARY ANGIOGRAPHY;  Surgeon: Yvonne Kendall, MD;  Location: [facility] INVASIVE CV LAB;  Service: Cardiovascular;  Laterality: N/A;   LITHOTRIPSY  2015   MELANOMA EXCISION  1980   malignant   NERVE SURGERY  2015   ulna nerve   pre cancer lesion from scalp  09/2022   RIGHT HEART CATH AND CORONARY ANGIOGRAPHY Bilateral 11/26/2022    Procedure: RIGHT HEART CATH AND CORONARY ANGIOGRAPHY;  Surgeon: Yvonne Kendall, MD;  Location: [facility] INVASIVE CV LAB;  Service: Cardiovascular;  Laterality: Bilateral;   TONSILLECTOMY  1945   WISDOM TOOTH EXTRACTION      Allergies: Allergies as of 04/10/2023 - Review Complete 04/10/2023  Allergen Reaction Noted   Metformin and related Diarrhea and Other (See Comments) 07/13/2018   Azithromycin Other (See Comments) 07/07/2015   Other Other (See Comments) 07/07/2019   Percocet [oxycodone-acetaminophen] Other (See Comments) 07/07/2015   Jardiance [empagliflozin] Other (See Comments) 08/03/2019    Medications: Outpatient Encounter Medications as of 04/10/2023  Medication Sig   amiodarone (PACERONE) 200 MG tablet Take 1 tablet (200 mg total) by mouth daily.   aspirin EC 81 MG tablet Take 1 tablet (81 mg total) by mouth daily. Swallow whole.   azelastine (ASTELIN) 0.1 % nasal spray Place 1 spray into both nostrils 2 (two) times daily. Use in each nostril as directed (Patient taking differently: Place 1 spray into both nostrils 2 (two) times  daily as needed for rhinitis. Use in each nostril as directed)   bisacodyl (DULCOLAX) 5 MG EC tablet Take 5 mg by mouth daily as needed for moderate constipation.   Blood Glucose Monitoring Suppl (ONETOUCH VERIO FLEX SYSTEM) w/Device KIT    Blood Glucose Monitoring Suppl DEVI 1 each by Does not apply route in the morning and at bedtime. May substitute to any manufacturer covered by patient's insurance.   carvedilol (COREG) 3.125 MG tablet Take 1 tablet (3.125 mg total) by mouth 2 (two) times daily with a meal.   clopidogrel (PLAVIX) 75 MG tablet TAKE 1 TABLET(75 MG) BY MOUTH DAILY   Continuous Glucose Monitor Sup MISC 1 Units by Does not apply route daily.   Continuous Glucose Receiver DEVI 1 Units by Does not apply route daily.   dextromethorphan-guaiFENesin (MUCINEX DM) 30-600 MG 12hr tablet Take 1 tablet by mouth daily as needed (Sinus Congestion).    ezetimibe (ZETIA) 10 MG tablet TAKE 1 TABLET(10 MG) BY MOUTH DAILY   famotidine (PEPCID) 20 MG tablet Take 20 mg by mouth daily with lunch.   gabapentin (NEURONTIN) 100 MG capsule TAKE 1 CAPSULE(100 MG) BY MOUTH THREE TIMES DAILY (Patient taking differently: Take 100 mg by mouth at bedtime.)   glipiZIDE (GLUCOTROL) 10 MG tablet TAKE 1/2 TABLET BY MOUTH EVERY MORNING WITH BREAKFAST AND 1 TABLET EVERY EVENING WITH SUPPER.   Glycerin-Hypromellose-PEG 400 (DRY EYE RELIEF DROPS) 0.2-0.2-1 % SOLN Place 1 drop into both eyes daily.   Lancets (ONETOUCH ULTRASOFT) lancets Use as instructed   lisinopril (ZESTRIL) 10 MG tablet Take 10 mg by mouth daily.   midodrine (PROAMATINE) 5 MG tablet Take 1 tablet (5 mg total) by mouth 3 (three) times daily as needed. Take when BP does drop less than 110 when standing. Additional dose at 2 pm if still dropping below 110 when standing   Multiple Vitamin (MULTIVITAMIN) tablet Take 1 tablet by mouth daily with lunch.   Omega-3 Fatty Acids (FISH OIL PO) Take 1,500 mg by mouth daily with lunch.   ONETOUCH VERIO test strip USE TO CHECK BLOOD SUGAR TWICE DAILY AS DIRECTED   pantoprazole (PROTONIX) 40 MG tablet Take 1 tablet (40 mg total) by mouth daily.   Psyllium 83 % POWD Take 15 mLs by mouth at bedtime.   rosuvastatin (CRESTOR) 10 MG tablet Take 10 mg by mouth every other day.   Semaglutide, 1 MG/DOSE, 4 MG/3ML SOPN Inject 1 mg as directed once a week.   sodium chloride (OCEAN) 0.65 % SOLN nasal spray Place 1 spray into both nostrils daily as needed for congestion (Sinus).   spironolactone (ALDACTONE) 25 MG tablet TAKE 1 TABLET(25 MG) BY MOUTH DAILY   tamsulosin (FLOMAX) 0.4 MG CAPS capsule TAKE ONE CAPSULE BY MOUTH DAILY   traMADol (ULTRAM) 50 MG tablet TAKE 1 TABLET BY MOUTH EVERY 12 HOURS AS NEEDED   No facility-administered encounter medications on file as of 04/10/2023.    Social History: Social History   Tobacco Use   Smoking status: Some Days    Types: Pipe     Passive exposure: Current   Smokeless tobacco: Former    Quit date: 10/17/2015  Vaping Use   Vaping status: Never Used  Substance Use Topics   Alcohol use: Yes    Alcohol/week: 0.0 - 1.0 standard drinks of alcohol    Comment: OCCASIONALLY   Drug use: No    Family Medical History: Family History  Problem Relation Age of Onset   Hyperlipidemia Mother  Hypertension Mother    Heart disease Mother    Diabetes Mother    Heart attack Mother    Colon cancer Father        metasized to liver, adrenal, lungs   Lung cancer Father    Kidney cancer Father        malignant capsulated kidney tumor   Diabetes Father    Liver cancer Father    Bladder Cancer Neg Hx    Prostate cancer Neg Hx     Physical Examination: Vitals:   04/10/23 1508  BP: 132/78    Awake, alert, oriented to person, place, and time.  Speech is clear and fluent. Fund of knowledge is appropriate.   Cranial Nerves: Pupils equal round and reactive to light.  Facial tone is symmetric.   No abnormal lesions on exposed skin.   Mild central lower lumbar tenderness.   Strength: Side Iliopsoas Quads Hamstring PF DF EHL  R 5 5 5 5 5 5   L 5 5 5 5 5 5     Reflexes are 2+ and symmetric at the biceps, brachioradialis, patella and achilles.   Hoffman's is absent.  Clonus is not present.   Bilateral lower extremity sensation is intact to light touch.     Gait is normal.    Medical Decision Making  Imaging: none  Assessment and Plan: [name] has constant LBP with bilateral leg pain lateral to his knees x years that has been worse in last 2 months. He has weakness in his legs. No numbness or tingling.   He has known slip at L4-L5 with mild central stenosis and moderate bilateral foraminal stenosis. Also with bilateral foraminal stenosis at L3-L4 as well.   He has a pacemaker and defibrillator.   Treatment options discussed with patient and following plan made:   - Referral to pain management ([facility]) to  discuss lumbar injections.  - Continue HEP from PT. Avoid exercises that make him worse.  - Medications limited as he is on PLAVIX. - Follow up with me in 8 weeks and prn.   I spent a total of 20 minutes in face-to-face and non-face-to-face activities related to this patient's care today including review of outside records, review of imaging, review of symptoms, physical exam, discussion of differential diagnosis, discussion of treatment options, and documentation.   Drake Leach PA-C Dept. of Neurosurgery

## 2023-04-10 ENCOUNTER — Ambulatory Visit: Admitting: Orthopedic Surgery

## 2023-04-10 ENCOUNTER — Encounter: Payer: Self-pay | Admitting: Orthopedic Surgery

## 2023-04-10 VITALS — BP 132/78 | Ht 72.0 in | Wt 210.0 lb

## 2023-04-10 DIAGNOSIS — M4316 Spondylolisthesis, lumbar region: Secondary | ICD-10-CM

## 2023-04-10 DIAGNOSIS — M48061 Spinal stenosis, lumbar region without neurogenic claudication: Secondary | ICD-10-CM | POA: Diagnosis not present

## 2023-04-10 DIAGNOSIS — M4726 Other spondylosis with radiculopathy, lumbar region: Secondary | ICD-10-CM | POA: Diagnosis not present

## 2023-04-10 DIAGNOSIS — M47816 Spondylosis without myelopathy or radiculopathy, lumbar region: Secondary | ICD-10-CM

## 2023-04-10 DIAGNOSIS — M5416 Radiculopathy, lumbar region: Secondary | ICD-10-CM

## 2023-04-10 NOTE — Patient Instructions (Signed)
 It was so nice to see you today. Thank you so much for coming in.    You have some wear and tear in your back. You have a slip at L4-L5 with some irritation of the nerves at this level and at L3-L4.   I want you to see pain management here in [place] (Dr. Cherylann Ratel) to discuss possible lumbar injections. They should call you to schedule an appointment or you can call them at [phone].   I will see you back in 8 weeks. Please do not hesitate to call if you have any questions or concerns. You can also message me in MyChart.   [name] Leach PA-C [phone]     The physicians and staff at [facility] at [facility] are committed to providing excellent care. You may receive a survey asking for feedback about your experience at our office. We value you your feedback and appreciate you taking the time to to fill it out. The [facility] leadership team is also available to discuss your experience in person, feel free to contact us [phone].

## 2023-04-16 ENCOUNTER — Other Ambulatory Visit: Payer: Self-pay | Admitting: Family

## 2023-04-16 MED ORDER — LISINOPRIL 10 MG PO TABS: 10.0000 mg | ORAL_TABLET | Freq: Every day | ORAL | 1 refills | Status: DC

## 2023-04-16 NOTE — Telephone Encounter (Signed)
 Spoke to pt he confirmed that he is taking the Lisinopril 10 mg . Rx for 90 days with 1 refill sent in to the pharmacy

## 2023-04-16 NOTE — Addendum Note (Signed)
 Addended by: [place], Mckensie Scotti on: 04/16/2023 03:09 PM   Modules accepted: Orders

## 2023-04-23 ENCOUNTER — Ambulatory Visit: Payer: Medicare Other | Admitting: Family

## 2023-04-24 ENCOUNTER — Encounter: Payer: Self-pay | Admitting: Family

## 2023-04-24 ENCOUNTER — Ambulatory Visit: Admitting: Family

## 2023-04-24 VITALS — BP 124/70 | HR 62 | Temp 97.6°F | Ht 72.0 in | Wt 208.8 lb

## 2023-04-24 DIAGNOSIS — Z7984 Long term (current) use of oral hypoglycemic drugs: Secondary | ICD-10-CM | POA: Diagnosis not present

## 2023-04-24 DIAGNOSIS — Z7985 Long-term (current) use of injectable non-insulin antidiabetic drugs: Secondary | ICD-10-CM | POA: Diagnosis not present

## 2023-04-24 DIAGNOSIS — M545 Low back pain, unspecified: Secondary | ICD-10-CM

## 2023-04-24 DIAGNOSIS — I25118 Atherosclerotic heart disease of native coronary artery with other forms of angina pectoris: Secondary | ICD-10-CM

## 2023-04-24 DIAGNOSIS — E118 Type 2 diabetes mellitus with unspecified complications: Secondary | ICD-10-CM

## 2023-04-24 DIAGNOSIS — G8929 Other chronic pain: Secondary | ICD-10-CM | POA: Diagnosis not present

## 2023-04-24 DIAGNOSIS — R7309 Other abnormal glucose: Secondary | ICD-10-CM

## 2023-04-24 LAB — POCT GLYCOSYLATED HEMOGLOBIN (HGB A1C): Hemoglobin A1C: 6.8 % — AB (ref 4.0–5.6)

## 2023-04-24 MED ORDER — TIZANIDINE HCL 2 MG PO CAPS: 2.0000 mg | ORAL_CAPSULE | Freq: Every evening | ORAL | 1 refills | Status: DC | PRN

## 2023-04-24 NOTE — Progress Notes (Unsigned)
 Assessment & Plan:  Chronic low back pain, unspecified back pain laterality, unspecified whether sciatica present Assessment & Plan: Chronic, suboptimal control.  Pending consult with Dr. Cherylann Ratel. Discussed PAD and symptom overlap. Continue tramadol 50 mg twice daily as needed, Tylenol arthritis 650mg  TID, gabapentin 100mg  TID.  Suspend Flexeril 5mg  d/t sedation. Trial tizandine 2mg .  Pending [place] ABI.   Orders: -     tiZANidine HCl; Take 1 capsule (2 mg total) by mouth at bedtime as needed for muscle spasms.  Dispense: 30 capsule; Refill: 1  Elevated glucose -     POCT glycosylated hemoglobin (Hb A1C)  Controlled type 2 diabetes mellitus with complication, without long-term current use of insulin (HCC) Assessment & Plan: Chronic, stable.  Continue Glipizide 10 mg twice daily, Ozempic 1 mg.  Constipation is managed with Metamucil.  Orders: -     tiZANidine HCl; Take 1 capsule (2 mg total) by mouth at bedtime as needed for muscle spasms.  Dispense: 30 capsule; Refill: 1 -     US ARTERIAL ABI (SCREENING LOWER EXTREMITY); Future  Coronary artery disease of native artery of native heart with stable angina pectoris (HCC)     Return precautions given.   Risks, benefits, and alternatives of the medications and treatment plan prescribed today were discussed, and patient expressed understanding.   Education regarding symptom management and diagnosis given to patient on AVS either electronically or printed.  No follow-ups on file.  Rennie Plowman, [name]  Subjective:    Patient ID: [name] [name], male    DOB: 01/17/1940, 83 y.o.   MRN: [id number]  CC: [name] [name] is a 83 y.o. male who presents today for follow up.   HPI: Accompanied by wife  Complains of increased low back pain, left hip pain  Pain worse with walking.   Today he had felt cramping in upper thighs, will alternate legs.  Pain can be present while sitting.  Pain improves at times with walking.   On occasion,  he will right lateral thigh numbness.   No leg swelling, injury, intermittent claudication, urinary or fecal incontinence, saddle anesthesia  Chronic distal neuropathy in feet.    He has completed PT.   Compliant with crestor 10mg .     Compliant with tramadol 50 mg twice daily as needed.  Compliant with Tylenol arthritis 650mg  TID, gabapentin 100mg  TID.  Flexeril 5mg  every day prn.   Follow-up neurosurgery 04/10/2023 pending referral  to pain management.  CT lumbar 06/12/22 grade 1 anterolisthesis L4 on L5.  Bilateral kidney stones. DG BL hips 02/12/22 with severe arthritic changes lumbar spine both hips.  Left nephrolithiasis  Compliant with Glipizide 10 mg twice daily, Ozempic 1 mg  He has started   OTC psyllium combination supplement and constipation well managed.    Follow-up urology 09/09/2023 Allergies: Metformin and related, Azithromycin, Other, Percocet [oxycodone-acetaminophen], and Jardiance [empagliflozin] Current Outpatient Medications on File Prior to Visit  Medication Sig Dispense Refill   amiodarone (PACERONE) 200 MG tablet Take 1 tablet (200 mg total) by mouth daily. 90 tablet 3   aspirin EC 81 MG tablet Take 1 tablet (81 mg total) by mouth daily. Swallow whole. 90 tablet 3   azelastine (ASTELIN) 0.1 % nasal spray Place 1 spray into both nostrils 2 (two) times daily. Use in each nostril as directed (Patient taking differently: Place 1 spray into both nostrils 2 (two) times daily as needed for rhinitis. Use in each nostril as directed) 30 mL 6   bisacodyl (DULCOLAX) 5 MG  EC tablet Take 5 mg by mouth daily as needed for moderate constipation.     Blood Glucose Monitoring Suppl (ONETOUCH VERIO FLEX SYSTEM) w/Device KIT      Blood Glucose Monitoring Suppl DEVI 1 each by Does not apply route in the morning and at bedtime. May substitute to any manufacturer covered by patient's insurance. 1 each 0   carvedilol (COREG) 3.125 MG tablet Take 1 tablet (3.125 mg total) by mouth 2  (two) times daily with a meal. 180 tablet 3   clopidogrel (PLAVIX) 75 MG tablet TAKE 1 TABLET(75 MG) BY MOUTH DAILY 30 tablet 11   Continuous Glucose Monitor Sup MISC 1 Units by Does not apply route daily. 1 Units 0   Continuous Glucose Receiver DEVI 1 Units by Does not apply route daily. 1 Units 0   cyclobenzaprine (FLEXERIL) 5 MG tablet Take 5 mg by mouth as needed for muscle spasms.     dextromethorphan-guaiFENesin (MUCINEX DM) 30-600 MG 12hr tablet Take 1 tablet by mouth daily as needed (Sinus Congestion).     ezetimibe (ZETIA) 10 MG tablet TAKE 1 TABLET(10 MG) BY MOUTH DAILY 90 tablet 3   famotidine (PEPCID) 20 MG tablet Take 20 mg by mouth daily with lunch.     gabapentin (NEURONTIN) 100 MG capsule TAKE 1 CAPSULE(100 MG) BY MOUTH THREE TIMES DAILY (Patient taking differently: Take 100 mg by mouth at bedtime.) 90 capsule 3   Ginger, Zingiber officinalis, (GINGER ROOT PO) Take 75 mg by mouth daily.     glipiZIDE (GLUCOTROL) 10 MG tablet TAKE 1/2 TABLET BY MOUTH EVERY MORNING WITH BREAKFAST AND 1 TABLET EVERY EVENING WITH SUPPER. 120 tablet 3   Glucosamine 500 MG CAPS Take 1,500 mg by mouth daily as needed.     Glycerin-Hypromellose-PEG 400 (DRY EYE RELIEF DROPS) 0.2-0.2-1 % SOLN Place 1 drop into both eyes daily.     Lancets (ONETOUCH ULTRASOFT) lancets Use as instructed 100 each 12   lisinopril (ZESTRIL) 10 MG tablet Take 1 tablet (10 mg total) by mouth daily. 90 tablet 1   midodrine (PROAMATINE) 5 MG tablet Take 1 tablet (5 mg total) by mouth 3 (three) times daily as needed. Take when BP does drop less than 110 when standing. Additional dose at 2 pm if still dropping below 110 when standing 90 tablet 1   Multiple Vitamin (MULTIVITAMIN) tablet Take 1 tablet by mouth daily with lunch.     Omega-3 Fatty Acids (FISH OIL PO) Take 1,500 mg by mouth daily with lunch.     ONETOUCH VERIO test strip USE TO CHECK BLOOD SUGAR TWICE DAILY AS DIRECTED 100 strip 1   pantoprazole (PROTONIX) 40 MG tablet  Take 1 tablet (40 mg total) by mouth daily. 90 tablet 3   Psyllium 83 % POWD Take 15 mLs by mouth at bedtime.     rosuvastatin (CRESTOR) 10 MG tablet Take 10 mg by mouth every other day.     Semaglutide, 1 MG/DOSE, 4 MG/3ML SOPN Inject 1 mg as directed once a week. 3 mL 3   sodium chloride (OCEAN) 0.65 % SOLN nasal spray Place 1 spray into both nostrils daily as needed for congestion (Sinus).     spironolactone (ALDACTONE) 25 MG tablet TAKE 1 TABLET(25 MG) BY MOUTH DAILY 90 tablet 3   tamsulosin (FLOMAX) 0.4 MG CAPS capsule TAKE ONE CAPSULE BY MOUTH DAILY 90 capsule 3   traMADol (ULTRAM) 50 MG tablet TAKE 1 TABLET BY MOUTH EVERY 12 HOURS AS NEEDED 60 tablet 2  UNABLE TO FIND Take 75 mg by mouth daily. Med Name: SLIPPERY ELM     No current facility-administered medications on file prior to visit.    Review of Systems  Constitutional:  Negative for chills and fever.  Respiratory:  Negative for cough and shortness of breath.   Cardiovascular:  Negative for chest pain, palpitations and leg swelling.  Gastrointestinal:  Negative for nausea and vomiting.  Musculoskeletal:  Positive for back pain.  Neurological:  Positive for numbness.      Objective:    BP 124/70   Pulse 62   Temp 97.6 F (36.4 C) (Oral)   Ht 6' (1.829 m)   Wt 208 lb 12.8 oz (94.7 kg)   SpO2 95%   BMI 28.32 kg/m  BP Readings from Last 3 Encounters:  04/24/23 124/70  04/10/23 132/78  02/26/23 100/60   Wt Readings from Last 3 Encounters:  04/24/23 208 lb 12.8 oz (94.7 kg)  04/10/23 210 lb (95.3 kg)  03/10/23 210 lb 8 oz (95.5 kg)    Physical Exam Vitals reviewed.  Constitutional:      Appearance: He is well-developed.  Cardiovascular:     Rate and Rhythm: Regular rhythm.     Heart sounds: Normal heart sounds.     Comments: No LE edema, palpable cords or masses. No erythema or increased warmth.  No asymmetry in calf size when compared bilaterally No  hair growth  varicosities noted. LE warm .  Diminished bilateral pedal pulses.  Pulmonary:     Effort: Pulmonary effort is normal. No respiratory distress.     Breath sounds: Normal breath sounds. No wheezing, rhonchi or rales.  Skin:    General: Skin is warm and dry.  Neurological:     Mental Status: He is alert.  Psychiatric:        Speech: Speech normal.        Behavior: Behavior normal.

## 2023-04-24 NOTE — Patient Instructions (Addendum)
 Trial tizanidine 2mg  INSTEAD of flexeril.  Typically less sedating.    Do not take tizanidine and Flexeril at the same time as they are both muscle relaxants  I have ordered ultrasound of your bilateral legs to evaluate for peripheral artery disease.

## 2023-04-25 NOTE — Assessment & Plan Note (Signed)
 Chronic, stable.  Continue Glipizide 10 mg twice daily, Ozempic 1 mg.  Constipation is managed with Metamucil.

## 2023-04-25 NOTE — Assessment & Plan Note (Signed)
 Chronic, suboptimal control.  Pending consult with Dr. Cherylann Ratel. Discussed PAD and symptom overlap. Continue tramadol 50 mg twice daily as needed, Tylenol arthritis 650mg  TID, gabapentin 100mg  TID.  Suspend Flexeril 5mg  d/t sedation. Trial tizandine 2mg .  Pending [place] ABI.

## 2023-04-28 ENCOUNTER — Telehealth: Payer: Self-pay | Admitting: Family

## 2023-04-28 NOTE — Telephone Encounter (Signed)
 lft pt vm to call to [phone] press option 4 and then 2. Thank you!

## 2023-05-01 ENCOUNTER — Telehealth: Payer: Self-pay | Admitting: *Deleted

## 2023-05-01 ENCOUNTER — Ambulatory Visit
Attending: Student in an Organized Health Care Education/Training Program | Admitting: Student in an Organized Health Care Education/Training Program

## 2023-05-01 ENCOUNTER — Telehealth: Payer: Self-pay | Admitting: Cardiovascular Disease

## 2023-05-01 ENCOUNTER — Encounter: Payer: Self-pay | Admitting: Student in an Organized Health Care Education/Training Program

## 2023-05-01 VITALS — BP 101/59 | HR 58 | Temp 97.2°F | Resp 16 | Ht 73.0 in | Wt 209.0 lb

## 2023-05-01 DIAGNOSIS — M51362 Other intervertebral disc degeneration, lumbar region with discogenic back pain and lower extremity pain: Secondary | ICD-10-CM | POA: Insufficient documentation

## 2023-05-01 DIAGNOSIS — G894 Chronic pain syndrome: Secondary | ICD-10-CM | POA: Insufficient documentation

## 2023-05-01 DIAGNOSIS — M47816 Spondylosis without myelopathy or radiculopathy, lumbar region: Secondary | ICD-10-CM | POA: Insufficient documentation

## 2023-05-01 DIAGNOSIS — M5416 Radiculopathy, lumbar region: Secondary | ICD-10-CM | POA: Diagnosis present

## 2023-05-01 NOTE — Progress Notes (Signed)
 PROVIDER NOTE: Interpretation of information contained herein should be left to medically-trained personnel. Specific patient instructions are provided elsewhere under "Patient Instructions" section of medical record. This document was created in part using AI and STT-dictation technology, any transcriptional errors that may result from this process are unintentional.  Patient: [name] [name]  Service: E/M Encounter  Provider: Edward Jolly, MD  DOB: Jan 16, 1940  Delivery: Face-to-face  Specialty: Interventional Pain Management  MRN: [id number]  Setting: Ambulatory outpatient facility  Specialty designation: 09  Type: New Patient  Location: Outpatient office facility  PCP: Allegra Grana, [name]  DOS: 05/01/2023    Referring Prov.: Drake Leach, PA-C   Primary Reason(s) for Visit: Encounter for initial evaluation of one or more chronic problems (new to examiner) potentially causing chronic pain, and posing a threat to normal musculoskeletal function. (Level of risk: High) CC: Back Pain and Shoulder Pain (right)  HPI  [name] [name] is a 83 y.o. year old, male patient, who comes for the first time to our practice referred by Drake Leach, PA-C for our initial evaluation of his chronic pain. He has CAD (coronary artery disease); Essential hypertension; Mixed hyperlipidemia; Controlled type 2 diabetes mellitus with complication, without long-term current use of insulin (HCC); History of melanoma; Chronic low back pain; BPH (benign prostatic hyperplasia); Bilateral hand numbness; Insomnia; Neck pain; Atherosclerosis of abdominal aorta (HCC); Carotid artery disease (HCC); Acute non-recurrent maxillary sinusitis; Right renal mass; Abdominal pain; Fatty liver; Hematuria; Cardiac murmur; Tobacco abuse; SCC (squamous cell carcinoma); Cervical stenosis of spine; GERD (gastroesophageal reflux disease); Atherosclerosis of native coronary artery with stable angina pectoris (HCC); Kidney stones, calcium oxalate; Skin lesion;  Pruritus; Right shoulder pain; Acute left flank pain; Post-nasal drip; Allergic rhinitis; Esophageal ulcer; Esophagitis, unspecified without bleeding; De Quervain's tenosynovitis, left; NSTEMI (non-ST elevated myocardial infarction) (HCC); VT (ventricular tachycardia) (HCC); Ischemic cardiomyopathy; Chronic heart failure with mildly reduced ejection fraction (HFmrEF, 41-49%) (HCC); Mural thrombus of left ventricle; Cardiac resynchronization therapy defibrillator (CRT-D) in place; Cardiomyopathy [facility]); Malfunction of implantable defibrillator ventricular (ICD) lead; Fatigue; Elevated bilirubin; Lung nodule; Diabetic neuropathy (HCC); Elevated liver enzymes; Constipation; Bronchitis; Myalgia; and Right leg swelling on their problem list. Today he comes in for evaluation of his Back Pain and Shoulder Pain (right)  Pain Assessment: Location: Lower Back Radiating: to outer aspects of thighs bilat Onset: More than a month ago Duration: Chronic pain Quality: Aching Severity: 2 /10 (subjective, self-reported pain score)  Effect on ADL: prolonged standing and lifting increases pain level, no pain when wakes up in am Timing: Constant Modifying factors: meds BP: (!) 101/59  HR: (!) 58  Onset and Duration: Gradual and Present longer than 3 months Cause of pain:  Osteoarthritis  Severity: Getting worse, NAS-11 at its worse: 8/10, and NAS-11 at its best: 2/10 Timing: Afternoon, Night, and During activity or exercise Aggravating Factors: Climbing, Stooping , Walking, and Walking uphill Alleviating Factors: Lying down and Medications Associated Problems: Constipation, Erectile dysfunction, Fatigue, Weakness, and Pain that wakes patient up Quality of Pain: Annoying, Intermittent, Disabling, Distressing, Exhausting, Getting longer, Heavy, Nagging, Sickening, Tiring, and Uncomfortable Previous Examinations or Tests: CT scan, Endoscopy, MRI scan, X-rays, and Nerve conduction test Previous Treatments: Epidural  steroid injections, Physical Therapy, and Trigger point injections  [name] [name] is being evaluated for possible interventional pain management therapies for the treatment of his chronic pain.   Discussed the use of AI scribe software for clinical note transcription with the patient, who gave verbal consent to proceed.  History of Present Illness  [name] [name] "[name] [name]" is an 83 year old male who presents with chronic low back pain. He was referred by [name] [name],  NSGY, for evaluation of his chronic low back pain.  He has experienced low back pain for approximately ten years, which has progressively worsened. The pain radiates down both legs, primarily affecting the outer thighs, and is exacerbated by standing for extended periods and lifting objects as light as ten pounds. He describes needing to walk bent forward after standing for a while. Initially, his pain is rated as 1-2/10 in the morning, escalating throughout the day with activity, and alleviated by sitting down.  He manages his pain primarily with acetaminophen, which provides some relief. Aleve was the most effective medication for him, but he cannot use it due to his history of three heart attacks. He also takes tramadol twice daily, which he feels has minimal effect, but experiences withdrawal symptoms when he stops it.  He has undergone physical therapy twice, which provided some relief. He also had an injection approximately ten to fifteen years ago that was helpful, but did not pursue further injections.  He experiences dizziness, which is unusual for him, with a low blood pressure reading of 79/50 initially, later increasing to 101/59. He mentions having had breakfast and his medications that morning. Repeat BP was 107/62  He has a significant cardiac history, including three heart attacks, and has implants such as a defibrillator and pacemaker. He takes Plavix and metoprolol as part of his cardiac management. No cardiac  events since the implants were placed.        Historic Controlled Substance Pharmacotherapy Review  PMP and historical list of controlled substances:  04/08/2023 04/08/2023  1 Tramadol Hcl 50 Mg Tablet 60.00 30 Ma Arn [id number] [facility] ([id number]) 0/2 20.00 MME Medicare [place]    Historical Monitoring: The patient  reports no history of drug use. List of prior UDS Testing: No results found for: "MDMA", "COCAINSCRNUR", "PCPSCRNUR", "PCPQUANT", "CANNABQUANT", "THCU", "ETH", "CBDTHCR", "D8THCCBX", "D9THCCBX" Historical Background Evaluation: [place] PMP: PDMP not reviewed this encounter. Review of the past 40-months conducted.             [place] Department of public safety, offender search: Engineer, mining Information) Non-contributory Risk Assessment Profile: Aberrant behavior: None observed or detected today Risk factors for fatal opioid overdose: None identified today Fatal overdose hazard ratio (HR): Calculation deferred Non-fatal overdose hazard ratio (HR): Calculation deferred Risk of opioid abuse or dependence: 0.7-3.0% with doses <= 36 MME/day and 6.1-26% with doses >= 120 MME/day. Substance use disorder (SUD) risk level: See below Personal History of Substance Abuse (SUD-Substance use disorder):  Alcohol: Negative  Illegal Drugs: Negative  Rx Drugs: Negative  ORT Risk Level calculation: Low Risk  Opioid Risk Tool - 05/01/23 0923       Family History of Substance Abuse   Alcohol Negative    Illegal Drugs Negative    Rx Drugs Negative      Personal History of Substance Abuse   Alcohol Negative    Illegal Drugs Negative    Rx Drugs Negative      Age   Age between 16-45 years  No      History of Preadolescent Sexual Abuse   History of Preadolescent Sexual Abuse Negative or Male      Psychological Disease   Psychological Disease Negative    Depression Negative      Total Score   Opioid Risk Tool Scoring 0    Opioid Risk Interpretation Low Risk  ORT Scoring interpretation  table:  Score <3 = Low Risk for SUD  Score between 4-7 = Moderate Risk for SUD  Score >8 = High Risk for Opioid Abuse   PHQ-2 Depression Scale:  Total score: 2  PHQ-2 Scoring interpretation table: (Score and probability of major depressive disorder)  Score 0 = No depression  Score 1 = 15.4% Probability  Score 2 = 21.1% Probability  Score 3 = 38.4% Probability  Score 4 = 45.5% Probability  Score 5 = 56.4% Probability  Score 6 = 78.6% Probability   PHQ-9 Depression Scale:  Total score: 8  PHQ-9 Scoring interpretation table:  Score 0-4 = No depression  Score 5-9 = Mild depression  Score 10-14 = Moderate depression  Score 15-19 = Moderately severe depression  Score 20-27 = Severe depression (2.4 times higher risk of SUD and 2.89 times higher risk of overuse)   Pharmacologic Plan:  Continue with PCP              Meds   Current Outpatient Medications:    amiodarone (PACERONE) 200 MG tablet, Take 1 tablet (200 mg total) by mouth daily., Disp: 90 tablet, Rfl: 3   aspirin EC 81 MG tablet, Take 1 tablet (81 mg total) by mouth daily. Swallow whole., Disp: 90 tablet, Rfl: 3   azelastine (ASTELIN) 0.1 % nasal spray, Place 1 spray into both nostrils 2 (two) times daily. Use in each nostril as directed (Patient taking differently: Place 1 spray into both nostrils 2 (two) times daily as needed for rhinitis. Use in each nostril as directed), Disp: 30 mL, Rfl: 6   bisacodyl (DULCOLAX) 5 MG EC tablet, Take 5 mg by mouth daily as needed for moderate constipation., Disp: , Rfl:    carvedilol (COREG) 3.125 MG tablet, Take 1 tablet (3.125 mg total) by mouth 2 (two) times daily with a meal., Disp: 180 tablet, Rfl: 3   clopidogrel (PLAVIX) 75 MG tablet, TAKE 1 TABLET(75 MG) BY MOUTH DAILY, Disp: 30 tablet, Rfl: 11   cyclobenzaprine (FLEXERIL) 5 MG tablet, Take 5 mg by mouth as needed for muscle spasms., Disp: , Rfl:    dextromethorphan-guaiFENesin (MUCINEX DM) 30-600 MG 12hr tablet, Take 1 tablet by  mouth daily as needed (Sinus Congestion)., Disp: , Rfl:    ezetimibe (ZETIA) 10 MG tablet, TAKE 1 TABLET(10 MG) BY MOUTH DAILY, Disp: 90 tablet, Rfl: 3   famotidine (PEPCID) 20 MG tablet, Take 20 mg by mouth daily with lunch., Disp: , Rfl:    gabapentin (NEURONTIN) 100 MG capsule, TAKE 1 CAPSULE(100 MG) BY MOUTH THREE TIMES DAILY (Patient taking differently: Take 100 mg by mouth at bedtime.), Disp: 90 capsule, Rfl: 3   Ginger, Zingiber officinalis, (GINGER ROOT PO), Take 75 mg by mouth daily., Disp: , Rfl:    glipiZIDE (GLUCOTROL) 10 MG tablet, TAKE 1/2 TABLET BY MOUTH EVERY MORNING WITH BREAKFAST AND 1 TABLET EVERY EVENING WITH SUPPER., Disp: 120 tablet, Rfl: 3   Glucosamine 500 MG CAPS, Take 1,500 mg by mouth daily as needed., Disp: , Rfl:    Glycerin-Hypromellose-PEG 400 (DRY EYE RELIEF DROPS) 0.2-0.2-1 % SOLN, Place 1 drop into both eyes daily., Disp: , Rfl:    Lancets (ONETOUCH ULTRASOFT) lancets, Use as instructed, Disp: 100 each, Rfl: 12   lisinopril (ZESTRIL) 10 MG tablet, Take 1 tablet (10 mg total) by mouth daily., Disp: 90 tablet, Rfl: 1   midodrine (PROAMATINE) 5 MG tablet, Take 1 tablet (5 mg total) by mouth 3 (three) times daily as  needed. Take when BP does drop less than 110 when standing. Additional dose at 2 pm if still dropping below 110 when standing, Disp: 90 tablet, Rfl: 1   Multiple Vitamin (MULTIVITAMIN) tablet, Take 1 tablet by mouth daily with lunch., Disp: , Rfl:    Omega-3 Fatty Acids (FISH OIL PO), Take 1,500 mg by mouth daily with lunch., Disp: , Rfl:    ONETOUCH VERIO test strip, USE TO CHECK BLOOD SUGAR TWICE DAILY AS DIRECTED, Disp: 100 strip, Rfl: 1   pantoprazole (PROTONIX) 40 MG tablet, Take 1 tablet (40 mg total) by mouth daily., Disp: 90 tablet, Rfl: 3   Psyllium 83 % POWD, Take 15 mLs by mouth at bedtime., Disp: , Rfl:    rosuvastatin (CRESTOR) 10 MG tablet, Take 10 mg by mouth every other day., Disp: , Rfl:    Semaglutide, 1 MG/DOSE, 4 MG/3ML SOPN, Inject 1 mg  as directed once a week., Disp: 3 mL, Rfl: 3   sodium chloride (OCEAN) 0.65 % SOLN nasal spray, Place 1 spray into both nostrils daily as needed for congestion (Sinus)., Disp: , Rfl:    spironolactone (ALDACTONE) 25 MG tablet, TAKE 1 TABLET(25 MG) BY MOUTH DAILY, Disp: 90 tablet, Rfl: 3   tamsulosin (FLOMAX) 0.4 MG CAPS capsule, TAKE ONE CAPSULE BY MOUTH DAILY, Disp: 90 capsule, Rfl: 3   tizanidine (ZANAFLEX) 2 MG capsule, Take 1 capsule (2 mg total) by mouth at bedtime as needed for muscle spasms., Disp: 30 capsule, Rfl: 1   traMADol (ULTRAM) 50 MG tablet, TAKE 1 TABLET BY MOUTH EVERY 12 HOURS AS NEEDED, Disp: 60 tablet, Rfl: 2   UNABLE TO FIND, Take 75 mg by mouth daily. Med Name: SLIPPERY ELM, Disp: , Rfl:    Blood Glucose Monitoring Suppl (ONETOUCH VERIO FLEX SYSTEM) w/Device KIT, , Disp: , Rfl:    Blood Glucose Monitoring Suppl DEVI, 1 each by Does not apply route in the morning and at bedtime. May substitute to any manufacturer covered by patient's insurance. (Patient not taking: Reported on 05/01/2023), Disp: 1 each, Rfl: 0   Continuous Glucose Monitor Sup MISC, 1 Units by Does not apply route daily., Disp: 1 Units, Rfl: 0   Continuous Glucose Receiver DEVI, 1 Units by Does not apply route daily. (Patient not taking: Reported on 05/01/2023), Disp: 1 Units, Rfl: 0    DG Cervical Spine Complete  Narrative CLINICAL DATA:  Neck pain  EXAM: CERVICAL SPINE - COMPLETE 4+ VIEW  COMPARISON:  July 28, 2015  FINDINGS: There is no evidence of cervical spine fracture or prevertebral soft tissue swelling. Alignment is normal. Mild-to-moderate degenerative joint changes with narrowed joint space, osteophyte formation are identified at C3 through C6, worsened compared prior exam.  IMPRESSION: Mild-to-moderate degenerative joint changes of cervical spine.   Electronically Signed By: Sherian Rein M.D. On: 02/12/2022 08:45   CT LUMBAR SPINE WO CONTRAST  Narrative CLINICAL DATA:  Provided  history: Lumbar spondylosis. Lumbar radiculopathy. Spondylolisthesis of lumbar region. Additional history provided: Low back pain with right lateral thigh pain.  EXAM: CT LUMBAR SPINE WITHOUT CONTRAST  TECHNIQUE: Multidetector CT imaging of the lumbar spine was performed without intravenous contrast administration. Multiplanar CT image reconstructions were also generated.  RADIATION DOSE REDUCTION: This exam was performed according to the departmental dose-optimization program which includes automated exposure control, adjustment of the mA and/or kV according to patient size and/or use of iterative reconstruction technique.  COMPARISON:  Lumbar spine radiographs 02/12/2022. CT abdomen/pelvis 03/16/2021.  FINDINGS: Segmentation: 5 lumbar vertebrae.  The caudal most well-formed intervertebral disc space is designated L5-S1.  Alignment: Slight grade 1 retrolisthesis at L1-L2 and L2-L3. 7 mm L4-L5 grade 1 anterolisthesis. Slight grade 1 retrolisthesis at L5-S1.  Vertebrae: Vertebral body height is maintained. No lumbar spine fracture.  Paraspinal and other soft tissues: Bilateral nephrolithiasis. Known bilateral fluid-filled renal lesions, some with associated rim calcification, incompletely imaged and incompletely assessed on the current exam. Aortoiliac atherosclerosis. No paraspinal mass or collection.  Disc levels:  Multilevel disc space narrowing, greatest at L4-L5 and L5-S1 (mild-to-moderate at these levels). L5-S1 disc vacuum phenomenon.  T12-L1: No significant disc herniation or stenosis.  L1-L2: Partially calcified disc bulge. Mild effacement of the ventral thecal sac. No significant foraminal stenosis.  L2-L3: Disc bulge. Mild facet arthrosis. No significant spinal canal stenosis. Mild relative right neural foraminal narrowing.  L3-L4: Disc bulge with mild endplate spurring. Ligamentum flavum hypertrophy. Mild bilateral subarticular narrowing. No  significant central canal stenosis. Mild-to-moderate bilateral neural foraminal narrowing.  L4-L5: 7 mm grade 1 anterolisthesis. Disc uncovering with disc bulge. Advanced facet arthrosis. Bilateral subarticular narrowing. Mild relative narrowing of the central canal. Moderate bilateral neural foraminal narrowing with potential to affect either exiting L4 nerve root.  L5-S1: Disc bulge. Mild facet arthrosis. No significant spinal canal stenosis. Mild relative left neural foraminal narrowing.  Other: Osseous fusion across the anterior aspect of the right sacroiliac joint.  IMPRESSION: 1. Lumbar spondylosis, as outlined and with findings most notably as follows. 2. At L4-L5, there is 7 mm grade 1 anterolisthesis. Mild-to-moderate disc degeneration. Disc uncovering with disc bulge. Advanced facet arthrosis. Bilateral subarticular narrowing. Mild relative narrowing of the central canal. Moderate bilateral neural foraminal narrowing with potential to affect either exiting L4 nerve root. 3. No more than mild spinal canal narrowing at the remaining lumbar levels. Additional sites of foraminal stenosis, as described and greatest bilaterally at L3-L4 (mild-to-moderate at this level). 4. Osseous fusion across the anterior aspect of the right sacroiliac joint. 5. Bilateral nephrolithiasis. 6. Known bilateral fluid-filled renal lesions, some with associated rim calcification, incompletely imaged and incompletely assessed on the current exam. Please refer to the prior CT abdomen/pelvis of 03/16/2021 for further description and for follow-up recommendations. 7.  Aortic Atherosclerosis (ICD10-I70.0).   Electronically Signed By: Jackey Loge D.O. On: 06/24/2022 09:29  Narrative CLINICAL DATA:  Likely this and pain.  EXAM: LUMBAR SPINE - COMPLETE 4+ VIEW  COMPARISON:  July 28, 2015, CT abdomen pelvis March 16, 2021  FINDINGS: There is no evidence of lumbar spine fracture. Curvature of  spine. Grade 1 anterolisthesis of L4 on L5 is identified. Facet joint sclerosis is identified in the lower lumbar spine. Bilateral kidney stones are noted. Calcified mass of the left kidney is identified unchanged compared to prior CT of 2023.  IMPRESSION: Degenerative joint changes of lumbar spine. No acute fracture or dislocation noted.   Electronically Signed By: Sherian Rein M.D. On: 02/12/2022 08:53   DG Wrist Complete Left  Narrative CLINICAL DATA:  Extreme left wrist pain.  EXAM: LEFT WRIST - COMPLETE 3+ VIEW  COMPARISON:  None.  FINDINGS: Moderate degenerative changes at the first carpometacarpal joint. Negative for a fracture or dislocation. Alignment of the wrist is normal.  IMPRESSION: No acute abnormality.  Osteoarthritic changes at the first carpometacarpal joint   Electronically Signed By: Richarda Overlie M.D. On: 01/08/2017 10:39  Complexity Note: Imaging results reviewed.  ROS  Cardiovascular: Heart trouble, Daily Aspirin intake, Heart attack ( Date: 2021), Heart surgery, Pacemaker or defibrillator, Heart failure, Heart murmur, Heart catheterization, and Blood thinners:  Antiplatelet Pulmonary or Respiratory: Shortness of breath and Smoking Neurological: Abnormal skin sensations (Peripheral Neuropathy) Psychological-Psychiatric: Difficulty sleeping and or falling asleep Gastrointestinal: Irregular, infrequent bowel movements (Constipation) Genitourinary: Passing kidney stones Hematological: Brusing easily and Bleeding easily Endocrine: High blood sugar controlled without the use of insulin (NIDDM) Rheumatologic: No reported rheumatological signs and symptoms such as fatigue, joint pain, tenderness, swelling, redness, heat, stiffness, decreased range of motion, with or without associated rash Musculoskeletal: Negative for myasthenia gravis, muscular dystrophy, multiple sclerosis or malignant hyperthermia Work History:  Retired  Allergies  Mr. Tuccillo is allergic to metformin and related, azithromycin, other, percocet [oxycodone-acetaminophen], and jardiance [empagliflozin].  Laboratory Chemistry Profile   Renal Lab Results  Component Value Date   BUN 22 11/22/2022   CREATININE 1.04 11/22/2022   BCR 21 11/22/2022   GFR 73.48 10/23/2022   GFRAA >60 07/09/2019   GFRNONAA >60 02/09/2022   SPECGRAV 1.016 10/29/2018   PHUR 6.5 10/29/2018   PROTEINUR NEGATIVE 04/19/2020     Electrolytes Lab Results  Component Value Date   NA 139 11/26/2022   K 4.0 11/26/2022   CL 105 11/22/2022   CALCIUM 9.2 11/22/2022   MG 1.4 (L) 05/20/2020     Hepatic Lab Results  Component Value Date   AST 46 (H) 10/23/2022   ALT 37 10/23/2022   ALBUMIN 3.9 10/23/2022   ALKPHOS 76 10/23/2022   LIPASE 55.0 09/09/2016     ID Lab Results  Component Value Date   SARSCOV2NAA NEGATIVE 02/09/2022   STAPHAUREUS NEGATIVE 02/15/2020   MRSAPCR NEGATIVE 02/15/2020   HCVAB NON REACTIVE 03/25/2019     Bone Lab Results  Component Value Date   VD25OH 33.0 01/08/2017     Endocrine Lab Results  Component Value Date   GLUCOSE 162 (H) 11/22/2022   GLUCOSEU NEGATIVE 04/19/2020   HGBA1C 6.8 (A) 04/24/2023   TSH 4.790 (H) 11/22/2022   FREET4 1.19 11/22/2022     Neuropathy Lab Results  Component Value Date   VITAMINB12 749 02/12/2022   FOLATE 20.5 02/12/2022   HGBA1C 6.8 (A) 04/24/2023     CNS No results found for: "COLORCSF", "APPEARCSF", "RBCCOUNTCSF", "WBCCSF", "POLYSCSF", "LYMPHSCSF", "EOSCSF", "PROTEINCSF", "GLUCCSF", "JCVIRUS", "CSFOLI", "IGGCSF", "LABACHR", "ACETBL"   Inflammation (CRP: Acute  ESR: Chronic) Lab Results  Component Value Date   CRP <0.8 01/08/2017   ESRSEDRATE 10 01/08/2017     Rheumatology Lab Results  Component Value Date   RF <10.0 01/08/2017   ANA Negative 03/25/2019   LABURIC 4.8 01/08/2017     Coagulation Lab Results  Component Value Date   INR 1.1 07/07/2019   LABPROT  13.8 07/07/2019   APTT 30 07/07/2019   PLT 163 11/22/2022     Cardiovascular Lab Results  Component Value Date   BNP 82.8 02/09/2022   CKTOTAL 72 02/12/2022   HGB 12.9 (L) 11/26/2022   HCT 38.0 (L) 11/26/2022     Screening Lab Results  Component Value Date   SARSCOV2NAA NEGATIVE 02/09/2022   STAPHAUREUS NEGATIVE 02/15/2020   MRSAPCR NEGATIVE 02/15/2020   HCVAB NON REACTIVE 03/25/2019     Cancer No results found for: "CEA", "CA125", "LABCA2"   Allergens No results found for: "ALMOND", "APPLE", "ASPARAGUS", "AVOCADO", "BANANA", "BARLEY", "BASIL", "BAYLEAF", "GREENBEAN", "LIMABEAN", "WHITEBEAN", "BEEFIGE", "REDBEET", "BLUEBERRY", "BROCCOLI", "CABBAGE", "MELON", "CARROT", "CASEIN", "CASHEWNUT", "CAULIFLOWER", "CELERY"     Note: Lab  results reviewed.  PFSH  Drug: Mr. Stefanelli  reports no history of drug use. Alcohol:  reports current alcohol use. Tobacco:  reports that he has been smoking pipe. He has been exposed to tobacco smoke. He quit smokeless tobacco use about 7 years ago. Medical:  has a past medical history of Aortic insufficiency, Arthritis, CAD (coronary artery disease), Chicken pox, Colon polyps, Diverticulitis, DM type 2 (diabetes mellitus, type 2) (HCC), Family history of adverse reaction to anesthesia, GERD (gastroesophageal reflux disease), Heart murmur, History of kidney stones, HOH (hard of hearing), Hyperlipidemia, Hypertension, Ischemic cardiomyopathy, Kidney stones, Melanoma (HCC) (1980), Mitral regurgitation, and Myocardial infarction (HCC). Family: family history includes Colon cancer in his father; Diabetes in his father and mother; Heart attack in his mother; Heart disease in his mother; Hyperlipidemia in his mother; Hypertension in his mother; Kidney cancer in his father; Liver cancer in his father; Lung cancer in his father.  Past Surgical History:  Procedure Laterality Date   BIV UPGRADE N/A 02/26/2021   Procedure: BIV ICD UPGRADE;  Surgeon: Boyce Byes, MD;  Location: [facility] INVASIVE CV LAB;  Service: Cardiovascular;  Laterality: N/A;   CHOLECYSTECTOMY  2012   COLONOSCOPY     in 2003 with polyp removed and leak anastomosis had to have open abdominal surgery    CORONARY ANGIOPLASTY WITH STENT PLACEMENT  1991 & 2005   CORONARY ATHERECTOMY N/A 07/08/2019   Procedure: CORONARY ATHERECTOMY;  Surgeon: Lucendia Rusk, MD;  Location: [facility] INVASIVE CV LAB;  Service: Cardiovascular;  Laterality: N/A;   CORONARY BALLOON ANGIOPLASTY N/A 07/08/2019   Procedure: CORONARY BALLOON ANGIOPLASTY;  Surgeon: Lucendia Rusk, MD;  Location: [facility] INVASIVE CV LAB;  Service: Cardiovascular;  Laterality: N/A;  diagonal    CORONARY PRESSURE/FFR STUDY N/A 07/07/2019   Procedure: INTRAVASCULAR PRESSURE WIRE/FFR STUDY;  Surgeon: Sammy Crisp, MD;  Location: [facility] INVASIVE CV LAB;  Service: Cardiovascular;  Laterality: N/A;   CORONARY STENT INTERVENTION N/A 07/08/2019   Procedure: CORONARY STENT INTERVENTION;  Surgeon: Lucendia Rusk, MD;  Location: [facility] [facility] INVASIVE CV LAB;  Service: Cardiovascular;  Laterality: N/A;  lad   CORONARY ULTRASOUND/IVUS N/A 07/08/2019   Procedure: Intravascular Ultrasound/IVUS;  Surgeon: Lucendia Rusk, MD;  Location: [facility] INVASIVE CV LAB;  Service: Cardiovascular;  Laterality: N/A;   CYSTOSCOPY/URETEROSCOPY/HOLMIUM LASER/STENT PLACEMENT Right 01/02/2018   Procedure: CYSTOSCOPY/URETEROSCOPY/HOLMIUM LASER/STENT PLACEMENT;  Surgeon: Lawerence Pressman, MD;  Location: [facility] ORS;  Service: Urology;  Laterality: Right;   ESOPHAGOGASTRODUODENOSCOPY (EGD) WITH PROPOFOL N/A 11/19/2016   Procedure: ESOPHAGOGASTRODUODENOSCOPY (EGD) WITH PROPOFOL;  Surgeon: Marnee Sink, MD;  Location: [facility] ENDOSCOPY;  Service: Endoscopy;  Laterality: N/A;   ESOPHAGOGASTRODUODENOSCOPY (EGD) WITH PROPOFOL N/A 02/02/2019   Procedure: ESOPHAGOGASTRODUODENOSCOPY (EGD) WITH PROPOFOL;  Surgeon: Marnee Sink, MD;  Location: [facility] ENDOSCOPY;  Service: Endoscopy;   Laterality: N/A;   ICD IMPLANT N/A 02/02/2020   Procedure: ICD IMPLANT;  Surgeon: Boyce Byes, MD;  Location: [facility] INVASIVE CV LAB;  Service: Cardiovascular;  Laterality: N/A;   LEAD REVISION/REPAIR N/A 02/16/2020   Procedure: LEAD REVISION/REPAIR;  Surgeon: Lei Pump, MD;  Location: [facility] INVASIVE CV LAB;  Service: Cardiovascular;  Laterality: N/A;   LEFT HEART CATH N/A 07/08/2019   Procedure: Left Heart Cath;  Surgeon: Lucendia Rusk, MD;  Location: [facility] INVASIVE CV LAB;  Service: Cardiovascular;  Laterality: N/A;   LEFT HEART CATH AND CORONARY ANGIOGRAPHY N/A 07/07/2019   Procedure: LEFT HEART CATH AND CORONARY ANGIOGRAPHY;  Surgeon: Sammy Crisp, MD;  Location: [facility] INVASIVE CV LAB;  Service: Cardiovascular;  Laterality: N/A;   LITHOTRIPSY  2015   MELANOMA EXCISION  1980   malignant   NERVE SURGERY  2015   ulna nerve   pre cancer lesion from scalp  09/2022   RIGHT HEART CATH AND CORONARY ANGIOGRAPHY Bilateral 11/26/2022   Procedure: RIGHT HEART CATH AND CORONARY ANGIOGRAPHY;  Surgeon: Sammy Crisp, MD;  Location: [facility] INVASIVE CV LAB;  Service: Cardiovascular;  Laterality: Bilateral;   TONSILLECTOMY  1945   WISDOM TOOTH EXTRACTION     Active Ambulatory Problems    Diagnosis Date Noted   CAD (coronary artery disease) 07/10/2015   Essential hypertension 07/10/2015   Mixed hyperlipidemia 07/10/2015   Controlled type 2 diabetes mellitus with complication, without long-term current use of insulin (HCC) 07/10/2015   History of melanoma 07/10/2015   Chronic low back pain 07/10/2015   BPH (benign prostatic hyperplasia) 07/10/2015   Bilateral hand numbness 07/10/2015   Insomnia 07/10/2015   Neck pain 07/28/2015   Atherosclerosis of abdominal aorta (HCC) 07/28/2015   Carotid artery disease (HCC) 10/03/2015   Acute non-recurrent maxillary sinusitis 10/17/2015   Right renal mass 10/31/2016   Abdominal pain 10/31/2016   Fatty liver 10/31/2016   Hematuria  10/31/2016   Cardiac murmur 01/08/2017   Tobacco abuse 01/26/2017   SCC (squamous cell carcinoma) 05/15/2017   Cervical stenosis of spine 05/15/2017   GERD (gastroesophageal reflux disease) 02/23/2018   Atherosclerosis of native coronary artery with stable angina pectoris (HCC) 05/17/2018   Kidney stones, calcium oxalate 05/28/2018   Skin lesion 06/12/2018   Pruritus 06/12/2018   Right shoulder pain 07/03/2018   Acute left flank pain 10/23/2018   Post-nasal drip 10/23/2018   Allergic rhinitis 10/23/2018   Esophageal ulcer 11/20/2018   Esophagitis, unspecified without bleeding    De Quervain's tenosynovitis, left 05/17/2019   NSTEMI (non-ST elevated myocardial infarction) (HCC) 07/07/2019   VT (ventricular tachycardia) (HCC) 07/09/2019   Ischemic cardiomyopathy    Chronic heart failure with mildly reduced ejection fraction (HFmrEF, 41-49%) (HCC) 02/02/2020   Mural thrombus of left ventricle 02/02/2020   Cardiac resynchronization therapy defibrillator (CRT-D) in place 02/02/2020   Cardiomyopathy [facility])    Malfunction of implantable defibrillator ventricular (ICD) lead 02/15/2020   Fatigue 03/31/2020   Elevated bilirubin 07/10/2020   Lung nodule 07/31/2020   Diabetic neuropathy (HCC) 02/15/2021   Elevated liver enzymes 03/12/2021   Constipation 08/27/2021   Bronchitis 11/28/2021   Myalgia 02/11/2022   Right leg swelling 07/03/2022   Resolved Ambulatory Problems    Diagnosis Date Noted   LLQ abdominal pain 07/10/2015   Respiratory infection 04/22/2016   RLQ abdominal pain 06/20/2016   Tick bite of back 06/20/2016   Mass of submandibular region 06/20/2016   Right ear pain 08/21/2016   Past Medical History:  Diagnosis Date   Aortic insufficiency    Arthritis    Chicken pox    Colon polyps    Diverticulitis    DM type 2 (diabetes mellitus, type 2) (HCC)    Family history of adverse reaction to anesthesia    Heart murmur    History of kidney stones    HOH (hard of  hearing)    Hyperlipidemia    Hypertension    Kidney stones    Melanoma (HCC) 1980   Mitral regurgitation    Myocardial infarction (HCC)    Constitutional Exam  General appearance: Well nourished, well developed, and well hydrated. In no apparent acute distress Vitals:   05/01/23 0934 05/01/23 0938  BP:  101/63 (!) 101/59  Pulse: (!) 59 (!) 58  Resp: 16   Temp: (!) 97.2 F (36.2 C)   SpO2: 99% 97%  Weight: 209 lb (94.8 kg)   Height: 6\' 1"  (1.854 m)    BMI Assessment: Estimated body mass index is 27.57 kg/m as calculated from the following:   Height as of this encounter: 6\' 1"  (1.854 m).   Weight as of this encounter: 209 lb (94.8 kg).  BMI interpretation table: BMI level Category Range association with higher incidence of chronic pain  <18 kg/m2 Underweight   18.5-24.9 kg/m2 Ideal body weight   25-29.9 kg/m2 Overweight Increased incidence by 20%  30-34.9 kg/m2 Obese (Class I) Increased incidence by 68%  35-39.9 kg/m2 Severe obesity (Class II) Increased incidence by 136%  >40 kg/m2 Extreme obesity (Class III) Increased incidence by 254%   Patient's current BMI Ideal Body weight  Body mass index is 27.57 kg/m. Ideal body weight: 79.9 kg (176 lb 2.4 oz) Adjusted ideal body weight: 85.9 kg (189 lb 4.6 oz)   BMI Readings from Last 4 Encounters:  05/01/23 27.57 kg/m  04/24/23 28.32 kg/m  04/10/23 28.48 kg/m  03/10/23 28.55 kg/m   Wt Readings from Last 4 Encounters:  05/01/23 209 lb (94.8 kg)  04/24/23 208 lb 12.8 oz (94.7 kg)  04/10/23 210 lb (95.3 kg)  03/10/23 210 lb 8 oz (95.5 kg)    Psych/Mental status: Alert, oriented x 3 (person, place, & time)       Eyes: PERLA Respiratory: No evidence of acute respiratory distress  Thoracic Spine Area Exam  Skin & Axial Inspection: No masses, redness, or swelling Alignment: Symmetrical Functional ROM: Unrestricted ROM Stability: No instability detected Muscle Tone/Strength: Functionally intact. No obvious  neuro-muscular anomalies detected. Sensory (Neurological): Unimpaired Muscle strength & Tone: No palpable anomalies Lumbar Spine Area Exam  Skin & Axial Inspection: No masses, redness, or swelling Alignment: Symmetrical Functional ROM: Pain restricted ROM       Stability: No instability detected Muscle Tone/Strength: Functionally intact. No obvious neuro-muscular anomalies detected. Sensory (Neurological): Musculoskeletal pain pattern Palpation: No palpable anomalies       Provocative Tests: Hyperextension/rotation test: (+) bilaterally for facet joint pain. Lumbar quadrant test (Kemp's test): (+) bilaterally for facet joint pain.  Gait & Posture Assessment  Ambulation: Unassisted Gait: Relatively normal for age and body habitus Posture: WNL  Lower Extremity Exam    Side: Right lower extremity  Side: Left lower extremity  Stability: No instability observed          Stability: No instability observed          Skin & Extremity Inspection: Skin color, temperature, and hair growth are WNL. No peripheral edema or cyanosis. No masses, redness, swelling, asymmetry, or associated skin lesions. No contractures.  Skin & Extremity Inspection: Skin color, temperature, and hair growth are WNL. No peripheral edema or cyanosis. No masses, redness, swelling, asymmetry, or associated skin lesions. No contractures.  Functional ROM: Unrestricted ROM                  Functional ROM: Unrestricted ROM                  Muscle Tone/Strength: Functionally intact. No obvious neuro-muscular anomalies detected.  Muscle Tone/Strength: Functionally intact. No obvious neuro-muscular anomalies detected.  Sensory (Neurological): Unimpaired        Sensory (Neurological): Unimpaired        DTR: Patellar: deferred today Achilles: deferred today Plantar: deferred today  DTR:  Patellar: deferred today Achilles: deferred today Plantar: deferred today  Palpation: No palpable anomalies  Palpation: No palpable anomalies     Assessment  Primary Diagnosis & Pertinent Problem List: The primary encounter diagnosis was Lumbar spondylosis. Diagnoses of Lumbar facet arthropathy, Lumbar radiculopathy, Degeneration of intervertebral disc of lumbar region with discogenic back pain and lower extremity pain, and Chronic pain syndrome were also pertinent to this visit.  Visit Diagnosis (New problems to examiner): 1. Lumbar spondylosis   2. Lumbar facet arthropathy   3. Lumbar radiculopathy   4. Degeneration of intervertebral disc of lumbar region with discogenic back pain and lower extremity pain   5. Chronic pain syndrome    Plan of Care (Initial workup plan)  Yukio Bisping has a history of greater than 3 months of moderate to severe pain which is resulted in functional impairment.  The patient has tried various conservative therapeutic options such as NSAIDs, Tylenol, muscle relaxants, physical therapy which was inadequately effective.  Patient's pain is predominantly axial with physical exam and L-MRI findings suggestive of facet arthropathy.  I reviewed the patient's lumbar MRI with him in detail.  Lumbar facet medial branch nerve blocks were discussed with the patient.  Risks and benefits were reviewed.  Patient would like to proceed with bilateral L3, L4, L5 medial branch nerve block.   Patient has a significant cardiac history with a history of 3 MIs, cardiac stents in place as well as pacemaker defibrillator.  We will need to get clearance from his cardiologist to see if he can stop his Plavix 7 days prior to his scheduled injections.  Okay to take aspirin 81 mg instead.  Procedure Orders         LUMBAR FACET(MEDIAL BRANCH NERVE BLOCK) MBNB     Provider-requested follow-up: Return in about 20 days (around 05/21/2023) for B/L L3, 4, 5 MBNB , in clinic (PO Valium 5mg ) (cardiac clearance to stop Plavix 7 days prior).  Future Appointments  Date Time Provider Department Center  05/05/2023  3:00 PM [facility]  05/06/2023 10:20 AM Antonieta Iba, MD [facility] None  05/26/2023 11:00 AM Edward Jolly, MD [facility] None  05/27/2023  7:00 AM [facility] DEVICE REMOTES [facility]  06/05/2023 10:30 AM Drake Leach, PA-C [facility] None  06/16/2023 11:00 AM Freddie Breech, DPM [facility]  08/26/2023  7:00 AM [facility] DEVICE REMOTES [facility]  09/09/2023  9:30 AM Sondra Come, MD [facility] None  10/27/2023  8:50 AM [facility] ANNUAL WELLNESS VISIT [facility]  11/25/2023  7:00 AM [facility] DEVICE REMOTES [facility]  02/24/2024  7:00 AM [facility] DEVICE REMOTES [facility]  03/03/2024 11:30 AM Deirdre Evener, MD [facility] None  05/25/2024  7:00 AM [facility] DEVICE REMOTES [facility]  08/24/2024  7:00 AM [facility] DEVICE REMOTES [facility]   I discussed the assessment and treatment plan with the patient. The patient was provided an opportunity to ask questions and all were answered. The patient agreed with the plan and demonstrated an understanding of the instructions.  Patient advised to call back or seek an in-person evaluation if the symptoms or condition worsens.  Duration of encounter: .  Total time on encounter, as per AMA guidelines included both the face-to-face and non-face-to-face time personally spent by the physician and/or other qualified health care professional(s) on the day of the encounter (includes time in activities that require the physician or other qualified health care professional and does not include time in activities normally performed by clinical  staff). Physician's time may include the following activities when performed: Preparing to see the patient (e.g., pre-charting review of records, searching for previously ordered imaging, lab work, and nerve conduction tests) Review of prior analgesic pharmacotherapies. Reviewing PMP Interpreting ordered tests (e.g., lab  work, imaging, nerve conduction tests) Performing post-procedure evaluations, including interpretation of diagnostic procedures Obtaining and/or reviewing separately obtained history Performing a medically appropriate examination and/or evaluation Counseling and educating the patient/family/caregiver Ordering medications, tests, or procedures Referring and communicating with other health care professionals (when not separately reported) Documenting clinical information in the electronic or other health record Independently interpreting results (not separately reported) and communicating results to the patient/ family/caregiver Care coordination (not separately reported)  Note by: Cephus Collin, MD (TTS and AI technology used. I apologize for any typographical errors that were not detected and corrected.) Date: 05/01/2023; Time: 11:13 AM

## 2023-05-01 NOTE — Telephone Encounter (Signed)
   Name: Khyri Hinzman  DOB: [birth year]  MRN: [id number]  Primary Cardiologist: Belva Boyden, MD  Chart reviewed as part of pre-operative protocol coverage. The patient has an upcoming visit scheduled with Dr. [name] on 05/06/2023 at which time clearance can be addressed in case there are any issues that would impact surgical recommendations. I added preop FYI to appointment note so that provider is aware to address at time of outpatient visit.  Per office protocol the cardiology provider should forward their finalized clearance decision and recommendations regarding antiplatelet therapy to the requesting party below.    I will route this message as FYI to requesting party and remove this message from the preop box as separate preop APP input not needed at this time.   Please call with any questions.  Francene Ing, Retha Cast, NP  05/01/2023, 11:31 AM

## 2023-05-01 NOTE — Patient Instructions (Signed)
 ______________________________________________________________________    General Risks and Possible Complications  Patient Responsibilities: It is important that you read this as it is part of your informed consent. It is our duty to inform you of the risks and possible complications associated with treatments offered to you. It is your responsibility as a patient to read this and to ask questions about anything that is not clear or that you believe was not covered in this document.  Patient's Rights: You have the right to refuse treatment. You also have the right to change your mind, even after initially having agreed to have the treatment done. However, under this last option, if you wait until the last second to change your mind, you may be charged for the materials used up to that point.  Introduction: Medicine is not an Visual merchandiser. Everything in Medicine, including the lack of treatment(s), carries the potential for danger, harm, or loss (which is by definition: Risk). In Medicine, a complication is a secondary problem, condition, or disease that can aggravate an already existing one. All treatments carry the risk of possible complications. The fact that a side effects or complications occurs, does not imply that the treatment was conducted incorrectly. It must be clearly understood that these can happen even when everything is done following the highest safety standards.  No treatment: You can choose not to proceed with the proposed treatment alternative. The "PRO(s)" would include: avoiding the risk of complications associated with the therapy. The "CON(s)" would include: not getting any of the treatment benefits. These benefits fall under one of three categories: diagnostic; therapeutic; and/or palliative. Diagnostic benefits include: getting information which can ultimately lead to improvement of the disease or symptom(s). Therapeutic benefits are those associated with the successful  treatment of the disease. Finally, palliative benefits are those related to the decrease of the primary symptoms, without necessarily curing the condition (example: decreasing the pain from a flare-up of a chronic condition, such as incurable terminal cancer).  General Risks and Complications: These are associated to most interventional treatments. They can occur alone, or in combination. They fall under one of the following six (6) categories: no benefit or worsening of symptoms; bleeding; infection; nerve damage; allergic reactions; and/or death. No benefits or worsening of symptoms: In Medicine there are no guarantees, only probabilities. No healthcare provider can ever guarantee that a medical treatment will work, they can only state the probability that it may. Furthermore, there is always the possibility that the condition may worsen, either directly, or indirectly, as a consequence of the treatment. Bleeding: This is more common if the patient is taking a blood thinner, either prescription or over the counter (example: Goody Powders, Fish oil, Aspirin, Garlic, etc.), or if suffering a condition associated with impaired coagulation (example: Hemophilia, cirrhosis of the liver, low platelet counts, etc.). However, even if you do not have one on these, it can still happen. If you have any of these conditions, or take one of these drugs, make sure to notify your treating physician. Infection: This is more common in patients with a compromised immune system, either due to disease (example: diabetes, cancer, human immunodeficiency virus [HIV], etc.), or due to medications or treatments (example: therapies used to treat cancer and rheumatological diseases). However, even if you do not have one on these, it can still happen. If you have any of these conditions, or take one of these drugs, make sure to notify your treating physician. Nerve Damage: This is more common when the treatment is  an invasive one, but it  can also happen with the use of medications, such as those used in the treatment of cancer. The damage can occur to small secondary nerves, or to large primary ones, such as those in the spinal cord and brain. This damage may be temporary or permanent and it may lead to impairments that can range from temporary numbness to permanent paralysis and/or brain death. Allergic Reactions: Any time a substance or material comes in contact with our body, there is the possibility of an allergic reaction. These can range from a mild skin rash (contact dermatitis) to a severe systemic reaction (anaphylactic reaction), which can result in death. Death: In general, any medical intervention can result in death, most of the time due to an unforeseen complication. ______________________________________________________________________      ______________________________________________________________________    Blood Thinners  IMPORTANT NOTICE:  If you take any of these, make sure to notify the nursing staff.  Failure to do so may result in serious injury.  Recommended time intervals to stop and restart blood-thinners, before & after invasive procedures  Generic Name Brand Name Pre-procedure: Stop medication for this amount of time before your procedure: Post-procedure: Wait this amount of time after the procedure before restarting your medication:  Abciximab Reopro 15 days 2 hrs  Alteplase Activase 10 days 10 days  Anagrelide Agrylin    Apixaban Eliquis 3 days 6 hrs  Cilostazol Pletal 3 days 5 hrs  Clopidogrel Plavix 7-10 days 2 hrs  Dabigatran Pradaxa 5 days 6 hrs  Dalteparin Fragmin 24 hours 4 hrs  Dipyridamole Aggrenox 11days 2 hrs  Edoxaban Lixiana; Savaysa 3 days 2 hrs  Enoxaparin  Lovenox 24 hours 4 hrs  Eptifibatide Integrillin 8 hours 2 hrs  Fondaparinux  Arixtra 72 hours 12 hrs  Hydroxychloroquine Plaquenil 11 days   Prasugrel Effient 7-10 days 6 hrs  Reteplase Retavase 10 days 10 days   Rivaroxaban Xarelto 3 days 6 hrs  Ticagrelor Brilinta 5-7 days 6 hrs  Ticlopidine Ticlid 10-14 days 2 hrs  Tinzaparin Innohep 24 hours 4 hrs  Tirofiban Aggrastat 8 hours 2 hrs  Warfarin Coumadin 5 days 2 hrs   Other medications with blood-thinning effects  NOTE: Consider stopping these if you have prolonged bleeding despite not taking any of the above blood thinners. Otherwise ask your provider and this will be decided on a case-by-case basis.  Product indications Generic (Brand) names Note  Cholesterol Lipitor Stop 4 days before procedure  Blood thinner (injectable) Heparin (LMW or LMWH Heparin) Stop 24 hours before procedure  Cancer Ibrutinib (Imbruvica) Stop 7 days before procedure  Malaria/Rheumatoid Hydroxychloroquine (Plaquenil) Stop 11 days before procedure  Thrombolytics  10 days before or after procedures   Over-the-counter (OTC) Products with blood-thinning effects  NOTE: Consider stopping these if you have prolonged bleeding despite not taking any of the above blood thinners. Otherwise ask your provider and this will be decided on a case-by-case basis.  Product Common names Stop Time  Aspirin > 325 mg Goody Powders, Excedrin, etc. 11 days  Aspirin <= 81 mg  7 days  Fish oil  4 days  Garlic supplements  7 days  Ginkgo biloba  36 hours  Ginseng  24 hours  NSAIDs Ibuprofen, Naprosyn, etc. 3 days  Vitamin E  4 days   ______________________________________________________________________      ______________________________________________________________________    Preparing for your procedure  Appointments: If you think you may not be able to keep your appointment, call 24-48 hours in advance  to cancel. We need time to make it available to others.  Procedure visits are for procedures only. During your procedure appointment there will be: NO Prescription Refills*. NO medication changes or discussions*. NO discussion of disability issues*. NO unrelated pain problem  evaluations*. NO evaluations to order other pain procedures*. *These will be addressed at a separate and distinct evaluation encounter on the provider's evaluation schedule and not during procedure days.  Instructions: Food intake: Avoid eating anything solid for at least 8 hours prior to your procedure. Clear liquid intake: You may take clear liquids such as water up to 2 hours prior to your procedure. (No carbonated drinks. No soda.) Transportation: Unless otherwise stated by your physician, bring a driver. (Driver cannot be a Market researcher, Pharmacist, community, or any other form of public transportation.) Morning Medicines: Except for blood thinners, take all of your other morning medications with a sip of water. Make sure to take your heart and blood pressure medicines. If your blood pressure's lower number is above 100, the case will be rescheduled. Blood thinners: Make sure to stop your blood thinners as instructed.  If you take a blood thinner, but were not instructed to stop it, call our office [phone] and ask to talk to a nurse. Not stopping a blood thinner prior to certain procedures could lead to serious complications. Diabetics on insulin: Notify the staff so that you can be scheduled 1st case in the morning. If your diabetes requires high dose insulin, take only  of your normal insulin dose the morning of the procedure and notify the staff that you have done so. Preventing infections: Shower with an antibacterial soap the morning of your procedure.  Build-up your immune system: Take 1000 mg of Vitamin C with every meal (3 times a day) the day prior to your procedure. Antibiotics: Inform the nursing staff if you are taking any antibiotics or if you have any conditions that may require antibiotics prior to procedures. (Example: recent joint implants)   Pregnancy: If you are pregnant make sure to notify the nursing staff. Not doing so may result in injury to the fetus, including death.  Sickness: If you  have a cold, fever, or any active infections, call and cancel or reschedule your procedure. Receiving steroids while having an infection may result in complications. Arrival: You must be in the facility at least 30 minutes prior to your scheduled procedure. Tardiness: Your scheduled time is also the cutoff time. If you do not arrive at least 15 minutes prior to your procedure, you will be rescheduled.  Children: Do not bring any children with you. Make arrangements to keep them home. Dress appropriately: There is always a possibility that your clothing may get soiled. Avoid long dresses. Valuables: Do not bring any jewelry or valuables.  Reasons to call and reschedule or cancel your procedure: (Following these recommendations will minimize the risk of a serious complication.) Surgeries: Avoid having procedures within 2 weeks of any surgery. (Avoid for 2 weeks before or after any surgery). Flu Shots: Avoid having procedures within 2 weeks of a flu shots or . (Avoid for 2 weeks before or after immunizations). Barium: Avoid having a procedure within 7-10 days after having had a radiological study involving the use of radiological contrast. (Myelograms, Barium swallow or enema study). Heart attacks: Avoid any elective procedures or surgeries for the initial 6 months after a "Myocardial Infarction" (Heart Attack). Blood thinners: It is imperative that you stop these medications before procedures. Let us know if you  if you take any blood thinner.  Infection: Avoid procedures during or within two weeks of an infection (including chest colds or gastrointestinal problems). Symptoms associated with infections include: Localized redness, fever, chills, night sweats or profuse sweating, burning sensation when voiding, cough, congestion, stuffiness, runny nose, sore throat, diarrhea, nausea, vomiting, cold or Flu symptoms, recent or current infections. It is specially important if the infection is over the area that  we intend to treat. Heart and lung problems: Symptoms that may suggest an active cardiopulmonary problem include: cough, chest pain, breathing difficulties or shortness of breath, dizziness, ankle swelling, uncontrolled high or unusually low blood pressure, and/or palpitations. If you are experiencing any of these symptoms, cancel your procedure and contact your primary care physician for an evaluation.  Remember:  Regular Business hours are:  Monday to Thursday 8:00 AM to 4:00 PM  Provider's Schedule: Renaldo Caroli, MD:  Procedure days: Tuesday and Thursday 7:30 AM to 4:00 PM  Cephus Collin, MD:  Procedure days: Monday and Wednesday 7:30 AM to 4:00 PM Last  Updated: 12/24/2022 ______________________________________________________________________     Facet Blocks Patient Information  Description: The facets are joints in the spine between the vertebrae.  Like any joints in the body, facets can become irritated and painful.  Arthritis can also effect the facets.  By injecting steroids and local anesthetic in and around these joints, we can temporarily block the nerve supply to them.  Steroids act directly on irritated nerves and tissues to reduce selling and inflammation which often leads to decreased pain.  Facet blocks may be done anywhere along the spine from the neck to the low back depending upon the location of your pain.   After numbing the skin with local anesthetic (like Novocaine), a small needle is passed onto the facet joints under x-ray guidance.  You may experience a sensation of pressure while this is being done.  The entire block usually lasts about 15-25 minutes.   Conditions which may be treated by facet blocks:  Low back/buttock pain Neck/shoulder pain Certain types of headaches  Preparation for the injection:  Do not eat any solid food or dairy products within 8 hours of your appointment. You may drink clear liquid up to 3 hours before appointment.  Clear liquids  include water, black coffee, juice or soda.  No milk or cream please. You may take your regular medication, including pain medications, with a sip of water before your appointment.  Diabetics should hold regular insulin (if taken separately) and take 1/2 normal NPH dose the morning of the procedure.  Carry some sugar containing items with you to your appointment. A driver must accompany you and be prepared to drive you home after your procedure. Bring all your current medications with you. An IV may be inserted and sedation may be given at the discretion of the physician. A blood pressure cuff, EKG and other monitors will often be applied during the procedure.  Some patients may need to have extra oxygen administered for a short period. You will be asked to provide medical information, including your allergies and medications, prior to the procedure.  We must know immediately if you are taking blood thinners (like Coumadin/Warfarin) or if you are allergic to IV iodine contrast (dye).  We must know if you could possible be pregnant.  Possible side-effects:  Bleeding from needle site Infection (rare, may require surgery) Nerve injury (rare) Numbness & tingling (temporary) Difficulty urinating (rare, temporary) Spinal headache (a headache worse with upright posture)  Light-headedness (temporary) Pain at injection site (serveral days) Decreased blood pressure (rare, temporary) Weakness in arm/leg (temporary) Pressure sensation in back/neck (temporary)   Call if you experience:  Fever/chills associated with headache or increased back/neck pain Headache worsened by an upright position New onset, weakness or numbness of an extremity below the injection site Hives or difficulty breathing (go to the emergency room) Inflammation or drainage at the injection site(s) Severe back/neck pain greater than usual New symptoms which are concerning to you  Please note:  Although the local anesthetic  injected can often make your back or neck feel good for several hours after the injection, the pain will likely return. It takes 3-7 days for steroids to work.  You may not notice any pain relief for at least one week.  If effective, we will often do a series of 2-3 injections spaced 3-6 weeks apart to maximally decrease your pain.  After the initial series, you may be a candidate for a more permanent nerve block of the facets.  If you have any questions, please call #[phone] [facility]

## 2023-05-01 NOTE — Telephone Encounter (Signed)
   Pre-operative Risk Assessment    Patient Name: [name] [name]  DOB: 04/25/1940 MRN: [id number]   Date of last office visit: 02/26/23 Date of next office visit: 05/06/23   Request for Surgical Clearance    Procedure:  Lumbar Faucets Blocks  Date of Surgery:  Clearance TBD                                Surgeon:  Not indicated Surgeon's Group or Practice Name:  [facility] Phone number:  [phone] Fax number:  [phone]   Type of Clearance Requested:  Pharmacy Plavix hold 7days    Type of Anesthesia:  Not Indicated   Additional requests/questions:    Signed, Fletcher Humble   05/01/2023, 11:00 AM

## 2023-05-01 NOTE — Progress Notes (Signed)
 Safety precautions to be maintained throughout the outpatient stay will include: orient to surroundings, keep bed in low position, maintain call bell within reach at all times, provide assistance with transfer out of bed and ambulation.

## 2023-05-05 ENCOUNTER — Ambulatory Visit
Admission: RE | Admit: 2023-05-05 | Discharge: 2023-05-05 | Disposition: A | Discharge status: Home / Self Care | Source: Ambulatory Visit | Attending: Family | Admitting: Family

## 2023-05-05 DIAGNOSIS — E118 Type 2 diabetes mellitus with unspecified complications: Secondary | ICD-10-CM | POA: Insufficient documentation

## 2023-05-05 LAB — COMPLIANCE DRUG ANALYSIS, UR

## 2023-05-05 NOTE — Progress Notes (Unsigned)
 Date:  05/06/2023   ID:  [name] [name], DOB 17-Aug-1940, MRN [id number]  Patient Location:  [address] [place] [address]   Provider location:   [facility], [place] office  PCP:  [name], [name]  Cardiologist:  Eura Mccauslin, [facility]  Chief Complaint  Patient presents with   Pre op clearance     Patient will need to come off Plavix  for 5-7 days prior to a nerve block procedure, scheduled for May 26, 2023. Patient c/o decrease in blood pressure for the past week; the systolic below 100 with diastolic in the mid 50 to 60 range with some dizziness.     History of Present Illness:    [name] [name] is a 83 y.o. male  past medical history of CAD ,NSTEMI,  stenting was 49, and  2005 , 4/21, 6/05 May 2019 , atherectomy, drug-eluting stent  STENT RESOLUTE ONYX 4.5X18, postdilated LAD and angioplasty of diagonal VT followed by EP DM II HTN Hyperlipidemia Melanoma Abdominal aorta atherosclerosis ICD 01/2020 for EF 35% to 40% Ef in 6/23: ef 55 to 60% Ejection fraction November 20, 2022  40 to 45% Who presents for routine follow-up of his coronary artery disease, moderate aortic valve stenosis  Last seen by myself in clinic 11/24 Last seen by EP April 2024  On discussion today he reports blood pressure has been running low especially for the past 7 days Has been checking blood pressure more recently Not uncommon to have systolic pressure 90, sometimes less On 1 occasion  70s systolic, took 1/2 midodrine  If pressure stays low takes a whole midodrine  Slightly dizzy at time but in general is asymptomatic  No chest pain concerning for angina Pacer downloads reviewed, BiV pacing 100%   Remains on Ozempic , minimal weight loss over the past year  Labs reviewed A1C 6.8 LDL 64  EKG personally reviewed by myself on todays visit EKG Interpretation Date/Time:  Tuesday May 06 2023 10:47:51 EDT Ventricular Rate:  59 PR Interval:  154 QRS  Duration:  158 QT Interval:  492 QTC Calculation: 487 R Axis:   262  Text Interpretation: Atrial-sensed ventricular-paced rhythm Biventricular pacemaker detected When compared with ECG of 26-Feb-2023 10:10, Vent. rate has decreased BY   3 BPM Confirmed by Bonnie Roig [phone]) on 05/06/2023 11:06:49 AM    Seen by EP in clinic November 22, 2022, phrenic nerve stimulation from device  cardiac catheterization November 26, 2022, chronic occlusion of RCA with collaterals, no significant change, medical management recommended  Phrenic nerve stim little better Takes neurontin  before bed, feels better  Crestor  every other day, zetia  daily  Chronic back pain, trouble walking  Recent imaging studies reviewed Echo 6/23 Left ventricular ejection fraction, by estimation, is 55 to 60%. The  left ventricle has normal function. The left ventricle has no regional  wall motion abnormalities. Left ventricular diastolic parameters are  consistent with Grade I diastolic  dysfunction (impaired relaxation).   2. Right ventricular systolic function is normal. The right ventricular  size is normal.   3. The mitral valve is normal in structure. No evidence of mitral valve  regurgitation.   4. The aortic valve is tricuspid. Aortic valve regurgitation is not  visualized. Mild to moderate aortic valve stenosis. Aortic valve mean  gradient measures 18.0 mmHg.  CT ABD/pelvis 07/11/2020 Mild diffuse aortic athero Small pericardial effusion, anterior  Cath 06/2019:  Catheterization chronically occluded RCA collaterals from left to right, Severe proximal LAD disease 80%,  stent placed. Underwent angioplasty of diagonal vessel, 75% lesion Patent stent in the ramus  Echo 12/2019 Left ventricular ejection fraction, by estimation, is 35 to 40%.\ ICD implant 02/02/2020  RV lead thresholds ahd risen significantly, was decided to pursue lead revision with suspect microdislodgement.  Echocardiogram December  2021 Ejection fraction 35 to 40%  Dr. Marven Slimmer noted his eliquis  2/2 LV thrombus that had since resolved and discontinued it.  Non-STEMI June 2021: Hospital Catheterization chronically occluded RCA collaterals from left to right Severe proximal LAD disease 80%, stent placed Angioplasty of diagonal vessel, 75% lesion  ZIO monitor with nonsustained VT, PVCs 10% burden  Cardiac MRI Ejection fraction 35%  LGE in the basal and mid inferior and inferior lateral walls, dyskinesis of the basal and inferior inferolateral walls and a left ventricular mural thrombus located on the inferolateral wall.   Past Medical History:  Diagnosis Date   Aortic insufficiency    a. noted on TTE 2015; b. 06/2019 Echo: AI not visualized.   Arthritis    CAD (coronary artery disease)    a. remote PCI in 1991 and 2005; b. MV 3/15: old inferior MI, no ischemia, LVEF 50%, slight inferior wall hypokniesis; c. 06/2019 NSTEMI/PCI: LM nl, LAD 80p/m (Atherectomy & 4.5x18 Resolute Onyx DES), 44m/d, D1 75 (PTCA), RI patent stent, LCX nl, RCA 100p, RPAV fills via L->R collats from LCX.   Chicken pox    Colon polyps    4 pre-cancerous    Diverticulitis    DM type 2 (diabetes mellitus, type 2) (HCC)    Family history of adverse reaction to anesthesia    GERD (gastroesophageal reflux disease)    Heart murmur    History of kidney stones    HOH (hard of hearing)    Hyperlipidemia    Hypertension    Ischemic cardiomyopathy    a. TTE 2015: EF  50-55%, mild global HK; b. 06/2019 Echo: EF 45-50%, Gr1 DD, basal inf AK. Triv MR.   Kidney stones    Melanoma (HCC) 1980   Resected from his back   Mitral regurgitation    a. noted on TTE 2015   Myocardial infarction [facility])    Past Surgical History:  Procedure Laterality Date   BIV UPGRADE N/A 02/26/2021   Procedure: BIV ICD UPGRADE;  Surgeon: Boyce Byes, MD;  Location[facility] INVASIVE CV LAB;  Service: Cardiovascular;  Laterality: N/A;   CHOLECYSTECTOMY  2012   COLONOSCOPY      in 2003 with polyp removed and leak anastomosis had to have open abdominal surgery    CORONARY ANGIOPLASTY WITH STENT PLACEMENT  1991 & 2005   CORONARY ATHERECTOMY N/A 07/08/2019   Procedure: CORONARY ATHERECTOMY;  Surgeon: Lucendia Rusk, MD;  Location: [facility] INVASIVE CV LAB;  Service: Cardiovascular;  Laterality: N/A;   CORONARY BALLOON ANGIOPLASTY N/A 07/08/2019   Procedure: CORONARY BALLOON ANGIOPLASTY;  Surgeon: Lucendia Rusk, MD;  Location: [facility] INVASIVE CV LAB;  Service: Cardiovascular;  Laterality: N/A;  diagonal    CORONARY PRESSURE/FFR STUDY N/A 07/07/2019   Procedure: INTRAVASCULAR PRESSURE WIRE/FFR STUDY;  Surgeon: Sammy Crisp, MD;  Location: [facility] INVASIVE CV LAB;  Service: Cardiovascular;  Laterality: N/A;   CORONARY STENT INTERVENTION N/A 07/08/2019   Procedure: CORONARY STENT INTERVENTION;  Surgeon: Lucendia Rusk, MD;  Location: [facility] INVASIVE CV LAB;  Service: Cardiovascular;  Laterality: N/A;  lad   CORONARY ULTRASOUND/IVUS N/A 07/08/2019   Procedure: Intravascular Ultrasound/IVUS;  Surgeon: Lucendia Rusk, MD;  Location: [facility] INVASIVE CV LAB;  Service: Cardiovascular;  Laterality: N/A;   CYSTOSCOPY/URETEROSCOPY/HOLMIUM LASER/STENT PLACEMENT Right 01/02/2018   Procedure: CYSTOSCOPY/URETEROSCOPY/HOLMIUM LASER/STENT PLACEMENT;  Surgeon: Lawerence Pressman, MD;  Location: [facility] ORS;  Service: Urology;  Laterality: Right;   ESOPHAGOGASTRODUODENOSCOPY (EGD) WITH PROPOFOL  N/A 11/19/2016   Procedure: ESOPHAGOGASTRODUODENOSCOPY (EGD) WITH PROPOFOL ;  Surgeon: Marnee Sink, MD;  Location: [facility] ENDOSCOPY;  Service: Endoscopy;  Laterality: N/A;   ESOPHAGOGASTRODUODENOSCOPY (EGD) WITH PROPOFOL  N/A 02/02/2019   Procedure: ESOPHAGOGASTRODUODENOSCOPY (EGD) WITH PROPOFOL ;  Surgeon: Marnee Sink, MD;  Location: [facility] ENDOSCOPY;  Service: Endoscopy;  Laterality: N/A;   ICD IMPLANT N/A 02/02/2020   Procedure: ICD IMPLANT;  Surgeon: Boyce Byes, MD;  Location: [facility]  INVASIVE CV LAB;  Service: Cardiovascular;  Laterality: N/A;   LEAD REVISION/REPAIR N/A 02/16/2020   Procedure: LEAD REVISION/REPAIR;  Surgeon: Lei Pump, MD;  Location: [facility] INVASIVE CV LAB;  Service: Cardiovascular;  Laterality: N/A;   LEFT HEART CATH N/A 07/08/2019   Procedure: Left Heart Cath;  Surgeon: Lucendia Rusk, MD;  Location: [facility] INVASIVE CV LAB;  Service: Cardiovascular;  Laterality: N/A;   LEFT HEART CATH AND CORONARY ANGIOGRAPHY N/A 07/07/2019   Procedure: LEFT HEART CATH AND CORONARY ANGIOGRAPHY;  Surgeon: Sammy Crisp, MD;  Location: [facility] INVASIVE CV LAB;  Service: Cardiovascular;  Laterality: N/A;   LITHOTRIPSY  2015   MELANOMA EXCISION  1980   malignant   NERVE SURGERY  2015   ulna nerve   pre cancer lesion from scalp  09/2022   RIGHT HEART CATH AND CORONARY ANGIOGRAPHY Bilateral 11/26/2022   Procedure: RIGHT HEART CATH AND CORONARY ANGIOGRAPHY;  Surgeon: Sammy Crisp, MD;  Location: [facility] INVASIVE CV LAB;  Service: Cardiovascular;  Laterality: Bilateral;   TONSILLECTOMY  1945   WISDOM TOOTH EXTRACTION       Current Meds  Medication Sig   amiodarone  (PACERONE ) 200 MG tablet Take 1 tablet (200 mg total) by mouth daily.   aspirin  EC 81 MG tablet Take 1 tablet (81 mg total) by mouth daily. Swallow whole.   azelastine  (ASTELIN ) 0.1 % nasal spray Place 1 spray into both nostrils 2 (two) times daily. Use in each nostril as directed (Patient taking differently: Place 1 spray into both nostrils 2 (two) times daily as needed for rhinitis. Use in each nostril as directed)   bisacodyl (DULCOLAX) 5 MG EC tablet Take 5 mg by mouth daily as needed for moderate constipation.   carvedilol  (COREG ) 3.125 MG tablet Take 1 tablet (3.125 mg total) by mouth 2 (two) times daily with a meal.   clopidogrel  (PLAVIX ) 75 MG tablet TAKE 1 TABLET(75 MG) BY MOUTH DAILY   cyclobenzaprine  (FLEXERIL ) 5 MG tablet Take 5 mg by mouth as needed for muscle spasms.    dextromethorphan-guaiFENesin (MUCINEX DM) 30-600 MG 12hr tablet Take 1 tablet by mouth daily as needed (Sinus Congestion).   ezetimibe  (ZETIA ) 10 MG tablet TAKE 1 TABLET(10 MG) BY MOUTH DAILY   famotidine (PEPCID) 20 MG tablet Take 20 mg by mouth daily with lunch.   gabapentin  (NEURONTIN ) 100 MG capsule TAKE 1 CAPSULE(100 MG) BY MOUTH THREE TIMES DAILY (Patient taking differently: Take 100 mg by mouth at bedtime.)   Ginger, Zingiber officinalis, (GINGER ROOT PO) Take 75 mg by mouth daily.   glipiZIDE  (GLUCOTROL ) 10 MG tablet TAKE 1/2 TABLET BY MOUTH EVERY MORNING WITH BREAKFAST AND 1 TABLET EVERY EVENING WITH SUPPER. (Patient taking differently: TAKE 1 TABLET BY MOUTH EVERY MORNING WITH BREAKFAST AND 1 TABLET EVERY EVENING WITH SUPPER.)   Glycerin-Hypromellose-PEG 400 (DRY EYE  RELIEF DROPS) 0.2-0.2-1 % SOLN Place 1 drop into both eyes daily.   lisinopril  (ZESTRIL ) 10 MG tablet Take 1 tablet (10 mg total) by mouth daily.   midodrine  (PROAMATINE ) 5 MG tablet Take 1 tablet (5 mg total) by mouth 3 (three) times daily as needed. Take when BP does drop less than 110 when standing. Additional dose at 2 pm if still dropping below 110 when standing   Multiple Vitamin (MULTIVITAMIN) tablet Take 1 tablet by mouth daily with lunch.   NON FORMULARY Ocean Better Fiber one capsule daily   Contains slipper Elm & psyllium Husk & ginger   pantoprazole  (PROTONIX ) 40 MG tablet Take 1 tablet (40 mg total) by mouth daily.   Psyllium 83 % POWD Take 15 mLs by mouth at bedtime.   rosuvastatin  (CRESTOR ) 10 MG tablet Take 10 mg by mouth every other day.   Semaglutide , 1 MG/DOSE, 4 MG/3ML SOPN Inject 1 mg as directed once a week.   sodium chloride  (OCEAN) 0.65 % SOLN nasal spray Place 1 spray into both nostrils daily as needed for congestion (Sinus).   spironolactone  (ALDACTONE ) 25 MG tablet TAKE 1 TABLET(25 MG) BY MOUTH DAILY   tamsulosin  (FLOMAX ) 0.4 MG CAPS capsule TAKE ONE CAPSULE BY MOUTH DAILY   tizanidine  (ZANAFLEX ) 2  MG capsule Take 1 capsule (2 mg total) by mouth at bedtime as needed for muscle spasms.   traMADol  (ULTRAM ) 50 MG tablet TAKE 1 TABLET BY MOUTH EVERY 12 HOURS AS NEEDED   UNABLE TO FIND Take 75 mg by mouth daily. Med Name: SLIPPERY ELM     Allergies:   Metformin  and related, Azithromycin, Other, Percocet [oxycodone -acetaminophen ], and Jardiance  [empagliflozin ]   Social History   Tobacco Use   Smoking status: Some Days    Types: Pipe    Passive exposure: Current   Smokeless tobacco: Former    Quit date: 10/17/2015  Vaping Use   Vaping status: Never Used  Substance Use Topics   Alcohol  use: Yes    Alcohol /week: 0.0 - 1.0 standard drinks of alcohol     Comment: OCCASIONALLY   Drug use: No     Family Hx: The patient's family history includes Colon cancer in his father; Diabetes in his father and mother; Heart attack in his mother; Heart disease in his mother; Hyperlipidemia in his mother; Hypertension in his mother; Kidney cancer in his father; Liver cancer in his father; Lung cancer in his father. There is no history of Bladder Cancer or Prostate cancer.  ROS:   Please see the history of present illness.    Review of Systems  Constitutional: Negative.   HENT: Negative.    Respiratory: Negative.    Cardiovascular: Negative.   Gastrointestinal: Negative.   Neurological: Negative.   Psychiatric/Behavioral: Negative.    All other systems reviewed and are negative.   Labs/Other Tests and Data Reviewed:    Recent Labs: 10/23/2022: ALT 37 11/22/2022: BUN 22; Creatinine, Ser 1.04; Platelets 163; TSH 4.790 11/26/2022: Hemoglobin 12.9; Potassium 4.0; Sodium 139   Recent Lipid Panel Lab Results  Component Value Date/Time   CHOL 118 06/19/2022 10:09 AM   TRIG 212.0 (H) 06/19/2022 10:09 AM   HDL 39.90 06/19/2022 10:09 AM   CHOLHDL 3 06/19/2022 10:09 AM   LDLCALC 35 12/21/2019 09:19 AM   LDLDIRECT 64.0 06/19/2022 10:09 AM    Wt Readings from Last 3 Encounters:  05/06/23 209 lb 4  oz (94.9 kg)  05/01/23 209 lb (94.8 kg)  04/24/23 208 lb 12.8 oz (94.7  kg)     Exam:    BP (!) 110/56 (BP Location: Left Arm, Patient Position: Sitting, Cuff Size: Normal)   Pulse (!) 59   Ht 6' (1.829 m)   Wt 209 lb 4 oz (94.9 kg)   SpO2 96%   BMI 28.38 kg/m  Constitutional:  oriented to person, place, and time. No distress.  HENT:  Head: Grossly normal Eyes:  no discharge. No scleral icterus.  Neck: No JVD, no carotid bruits  Cardiovascular: Regular rate and rhythm, no murmurs appreciated Pulmonary/Chest: Clear to auscultation bilaterally, no wheezes or rales Abdominal: Soft.  no distension.  no tenderness.  Musculoskeletal: Normal range of motion Neurological:  normal muscle tone. Coordination normal. No atrophy Skin: Skin warm and dry Psychiatric: normal affect, pleasant , ASSESSMENT & PLAN:    Orthostatic hypotension Recommend he move morning lisinopril  to the evening Suggest he take morning and noon medications after food, increase hydration midodrine  as needed for systolic pressure less than 100 - If blood pressure continues to run low we may need to decrease dosing of spironolactone  down to 12.5, lisinopril  down to 5 - Also on Flomax  which can drop blood pressure - Avoid large hot meals which could drop pressure postprandial  Sustained VT Monitored through ICD downloads, on carvedilol , amiodarone  Managed by EP, reports no significant near-syncope or syncope symptoms  Atherosclerosis of native coronary artery with stable angina pectoris, unspecified whether native or transplanted heart [facility]) cardiomyopathy ejection fraction 35 to 40% December 2022 Normalized ejection fraction June 2023 on echo Drop in ejection fraction back to 40% November 2024 catheterization results reviewed occluded RCA with collaterals left-to-right Continue carvedilol  spironolactone  and lisinopril  Close monitoring of blood pressure -Given hypotension, unable to transition to Entresto or add  SGLT2 inhibitor  Diarrhea:  Periods of constipation  cardiomyopathy, ischemic For now we will continue Coreg , lisinopril , spironolactone  EF 35 to 40% appear to have normalized, back to 40% on recent echo No medication changes given symptomatic hypotension  Aortic valve stenosis Moderate stenosis on recent echo November 2024 Annual echo  Nonsustained VT, PVCs Has  ICD, followed EP Lead revision successful sustained VT requiring antitachycardia pacing In the past with diaphragmatic stimulation, takes Neurontin  with improved symptoms  Atherosclerosis of abdominal aorta (HCC)  mild disease, no claudication sx Cholesterol at goal  Type 2 diabetes mellitus with other circulatory complication, without long-term current use of insulin  (HCC) Stable, minimal trend down in the weight over the past several years, only several pounds Remains on Ozempic   Carotid artery disease, unspecified laterality (HCC) U/s in 2017 Minor carotid atherosclerosis.   Preop cardiovascular evaluation for cortisone injection Acceptable risk to hold Plavix  7 days prior to lumbar injection Stay on aspirin  No further cardiac workup needed in preparation   Signed, Belva Boyden, MD  05/06/2023 11:06 AM    [facility] [address], [place], [place] [address]

## 2023-05-06 ENCOUNTER — Ambulatory Visit: Payer: Medicare Other | Attending: Cardiovascular Disease | Admitting: Cardiovascular Disease

## 2023-05-06 ENCOUNTER — Encounter: Payer: Self-pay | Admitting: Cardiovascular Disease

## 2023-05-06 DIAGNOSIS — I25118 Atherosclerotic heart disease of native coronary artery with other forms of angina pectoris: Secondary | ICD-10-CM

## 2023-05-06 DIAGNOSIS — I472 Ventricular tachycardia, unspecified: Secondary | ICD-10-CM | POA: Diagnosis not present

## 2023-05-06 DIAGNOSIS — I429 Cardiomyopathy, unspecified: Secondary | ICD-10-CM

## 2023-05-06 DIAGNOSIS — I442 Atrioventricular block, complete: Secondary | ICD-10-CM

## 2023-05-06 DIAGNOSIS — I5022 Chronic systolic (congestive) heart failure: Secondary | ICD-10-CM | POA: Diagnosis not present

## 2023-05-06 DIAGNOSIS — I255 Ischemic cardiomyopathy: Secondary | ICD-10-CM

## 2023-05-06 DIAGNOSIS — I2510 Atherosclerotic heart disease of native coronary artery without angina pectoris: Secondary | ICD-10-CM | POA: Diagnosis not present

## 2023-05-06 DIAGNOSIS — I779 Disorder of arteries and arterioles, unspecified: Secondary | ICD-10-CM

## 2023-05-06 MED ORDER — MIDODRINE HCL 5 MG PO TABS
5.0000 mg | ORAL_TABLET | Freq: Three times a day (TID) | ORAL | 3 refills | Status: AC | PRN
Start: 2023-05-06 — End: ?

## 2023-05-06 NOTE — Patient Instructions (Addendum)
 Ok to hold plavix  7 days prior to lumbar shot Hold on May 5th until after procedure  Medication Instructions:    Move the lisinopril  to the PM  Eat some food before taking pills Hydrate more  If you need a refill on your cardiac medications before your next appointment, please call your pharmacy.   Lab work: No new labs needed  Testing/Procedures: No new testing needed  Follow-Up: At [facility], you and your health needs are our priority.  As part of our continuing mission to provide you with exceptional heart care, we have created designated Provider Care Teams.  These Care Teams include your primary Cardiologist (physician) and Advanced Practice Providers (APPs -  Physician Assistants and Nurse Practitioners) who all work together to provide you with the care you need, when you need it.  You will need a follow up appointment in 6 months, APP ok  Providers on your designated Care Team:   Laneta Pintos, NP Varney Gentleman, PA-C Cadence Gennaro Khat, [place]  COVID-19 Vaccine Information can be found at: [email or web address] For questions related to vaccine distribution or appointments, please email [email or web address] or call [phone].

## 2023-05-07 ENCOUNTER — Encounter: Payer: Self-pay | Admitting: Family

## 2023-05-26 ENCOUNTER — Ambulatory Visit
Admission: RE | Admit: 2023-05-26 | Discharge: 2023-05-26 | Disposition: A | Discharge status: Home / Self Care | Source: Ambulatory Visit | Attending: Student in an Organized Health Care Education/Training Program | Admitting: Student in an Organized Health Care Education/Training Program

## 2023-05-26 ENCOUNTER — Ambulatory Visit
Attending: Student in an Organized Health Care Education/Training Program | Admitting: Student in an Organized Health Care Education/Training Program

## 2023-05-26 ENCOUNTER — Encounter: Payer: Self-pay | Admitting: Student in an Organized Health Care Education/Training Program

## 2023-05-26 VITALS — BP 139/93 | HR 65 | Temp 97.3°F | Resp 17 | Ht 73.0 in | Wt 201.0 lb

## 2023-05-26 DIAGNOSIS — M47816 Spondylosis without myelopathy or radiculopathy, lumbar region: Secondary | ICD-10-CM | POA: Insufficient documentation

## 2023-05-26 MED ORDER — DEXAMETHASONE SODIUM PHOSPHATE 10 MG/ML IJ SOLN
20.0000 mg | Freq: Once | INTRAMUSCULAR | Status: AC
  Administered 2023-05-26: 20 mg
  Filled 2023-05-26: qty 2

## 2023-05-26 MED ORDER — ROPIVACAINE HCL 2 MG/ML IJ SOLN
18.0000 mL | Freq: Once | INTRAMUSCULAR | Status: AC
  Administered 2023-05-26: 18 mL via PERINEURAL
  Filled 2023-05-26: qty 20

## 2023-05-26 MED ORDER — DIAZEPAM 5 MG PO TABS
5.0000 mg | ORAL_TABLET | ORAL | Status: AC
  Administered 2023-05-26: 5 mg via ORAL

## 2023-05-26 MED ORDER — DIAZEPAM 5 MG PO TABS
ORAL_TABLET | ORAL | Status: AC
  Filled 2023-05-26: qty 1

## 2023-05-26 MED ORDER — LIDOCAINE HCL 2 % IJ SOLN
20.0000 mL | Freq: Once | INTRAMUSCULAR | Status: AC
  Administered 2023-05-26: 400 mg
  Filled 2023-05-26: qty 40

## 2023-05-26 NOTE — Patient Instructions (Signed)
 ____________________________________________________________________________________________  Post-Procedure Discharge Instructions  Instructions: Apply ice:  Purpose: This will minimize any swelling and discomfort after procedure.  When: Day of procedure, as soon as you get home. How: Fill a plastic sandwich bag with crushed ice. Cover it with a small towel and apply to injection site. How long: (15 min on, 15 min off) Apply for 15 minutes then remove x 15 minutes.  Repeat sequence on day of procedure, until you go to bed. Apply heat:  Purpose: To treat any soreness and discomfort from the procedure. When: Starting the next day after the procedure. How: Apply heat to procedure site starting the day following the procedure. How long: May continue to repeat daily, until discomfort goes away. Food intake: Start with clear liquids (like water) and advance to regular food, as tolerated.  Physical activities: Keep activities to a minimum for the first 8 hours after the procedure. After that, then as tolerated. Driving: If you have received any sedation, be responsible and do not drive. You are not allowed to drive for 24 hours after having sedation. Blood thinner: (Applies only to those taking blood thinners) You may restart your blood thinner 6 hours after your procedure. Insulin: (Applies only to Diabetic patients taking insulin) As soon as you can eat, you may resume your normal dosing schedule. Infection prevention: Keep procedure site clean and dry. Shower daily and clean area with soap and water. Post-procedure Pain Diary: Extremely important that this be done correctly and accurately. Recorded information will be used to determine the next step in treatment. For the purpose of accuracy, follow these rules: Evaluate only the area treated. Do not report or include pain from an untreated area. For the purpose of this evaluation, ignore all other areas of pain, except for the treated area. After  your procedure, avoid taking a long nap and attempting to complete the pain diary after you wake up. Instead, set your alarm clock to go off every hour, on the hour, for the initial 8 hours after the procedure. Document the duration of the numbing medicine, and the relief you are getting from it. Do not go to sleep and attempt to complete it later. It will not be accurate. If you received sedation, it is likely that you were given a medication that may cause amnesia. Because of this, completing the diary at a later time may cause the information to be inaccurate. This information is needed to plan your care. Follow-up appointment: Keep your post-procedure follow-up evaluation appointment after the procedure (usually 2 weeks for most procedures, 6 weeks for radiofrequencies). DO NOT FORGET to bring you pain diary with you.   Expect: (What should I expect to see with my procedure?) From numbing medicine (AKA: Local Anesthetics): Numbness or decrease in pain. You may also experience some weakness, which if present, could last for the duration of the local anesthetic. Onset: Full effect within 15 minutes of injected. Duration: It will depend on the type of local anesthetic used. On the average, 1 to 8 hours.  From steroids (Applies only if steroids were used): Decrease in swelling or inflammation. Once inflammation is improved, relief of the pain will follow. Onset of benefits: Depends on the amount of swelling present. The more swelling, the longer it will take for the benefits to be seen. In some cases, up to 10 days. Duration: Steroids will stay in the system x 2 weeks. Duration of benefits will depend on multiple posibilities including persistent irritating factors. Side-effects: If present, they  may typically last 2 weeks (the duration of the steroids). Frequent: Cramps (if they occur, drink Gatorade and take over-the-counter Magnesium 450-500 mg once to twice a day); water retention with temporary  weight gain; increases in blood sugar; decreased immune system response; increased appetite. Occasional: Facial flushing (red, warm cheeks); mood swings; menstrual changes. Uncommon: Long-term decrease or suppression of natural hormones; bone thinning. (These are more common with higher doses or more frequent use. This is why we prefer that our patients avoid having any injection therapies in other practices.)  Very Rare: Severe mood changes; psychosis; aseptic necrosis. From procedure: Some discomfort is to be expected once the numbing medicine wears off. This should be minimal if ice and heat are applied as instructed.  Call if: (When should I call?) You experience numbness and weakness that gets worse with time, as opposed to wearing off. New onset bowel or bladder incontinence. (Applies only to procedures done in the spine)  Emergency Numbers: Durning business hours (Monday - Thursday, 8:00 AM - 4:00 PM) (Friday, 9:00 AM - 12:00 Noon): [phone] After hours: [phone] NOTE: If you are having a problem and are unable connect with, or to talk to a provider, then go to your nearest urgent care or emergency department. If the problem is serious and urgent, please call 911. ____________________________________________________________________________________________  Facet Blocks Patient Information  Description: The facets are joints in the spine between the vertebrae.  Like any joints in the body, facets can become irritated and painful.  Arthritis can also effect the facets.  By injecting steroids and local anesthetic in and around these joints, we can temporarily block the nerve supply to them.  Steroids act directly on irritated nerves and tissues to reduce selling and inflammation which often leads to decreased pain.  Facet blocks may be done anywhere along the spine from the neck to the low back depending upon the location of your pain.   After numbing the skin with local anesthetic  (like Novocaine), a small needle is passed onto the facet joints under x-ray guidance.  You may experience a sensation of pressure while this is being done.  The entire block usually lasts about 15-25 minutes.   Conditions which may be treated by facet blocks:  Low back/buttock pain Neck/shoulder pain Certain types of headaches  Preparation for the injection:  Do not eat any solid food or dairy products within 8 hours of your appointment. You may drink clear liquid up to 3 hours before appointment.  Clear liquids include water, black coffee, juice or soda.  No milk or cream please. You may take your regular medication, including pain medications, with a sip of water before your appointment.  Diabetics should hold regular insulin (if taken separately) and take 1/2 normal NPH dose the morning of the procedure.  Carry some sugar containing items with you to your appointment. A driver must accompany you and be prepared to drive you home after your procedure. Bring all your current medications with you. An IV may be inserted and sedation may be given at the discretion of the physician. A blood pressure cuff, EKG and other monitors will often be applied during the procedure.  Some patients may need to have extra oxygen administered for a short period. You will be asked to provide medical information, including your allergies and medications, prior to the procedure.  We must know immediately if you are taking blood thinners (like Coumadin/Warfarin) or if you are allergic to IV iodine contrast (dye).  We must know if you could possible be pregnant.  Possible side-effects:  Bleeding from needle site Infection (rare, may require surgery) Nerve injury (rare) Numbness & tingling (temporary) Difficulty urinating (rare, temporary) Spinal headache (a headache worse with upright posture) Light-headedness (temporary) Pain at injection site (serveral days) Decreased blood pressure (rare,  temporary) Weakness in arm/leg (temporary) Pressure sensation in back/neck (temporary)   Call if you experience:  Fever/chills associated with headache or increased back/neck pain Headache worsened by an upright position New onset, weakness or numbness of an extremity below the injection site Hives or difficulty breathing (go to the emergency room) Inflammation or drainage at the injection site(s) Severe back/neck pain greater than usual New symptoms which are concerning to you  Please note:  Although the local anesthetic injected can often make your back or neck feel good for several hours after the injection, the pain will likely return. It takes 3-7 days for steroids to work.  You may not notice any pain relief for at least one week.  If effective, we will often do a series of 2-3 injections spaced 3-6 weeks apart to maximally decrease your pain.  After the initial series, you may be a candidate for a more permanent nerve block of the facets.  If you have any questions, please call [phone] [facility]

## 2023-05-26 NOTE — Progress Notes (Signed)
 Safety precautions to be maintained throughout the outpatient stay will include: orient to surroundings, keep bed in low position, maintain call bell within reach at all times, provide assistance with transfer out of bed and ambulation.

## 2023-05-26 NOTE — Progress Notes (Signed)
 PROVIDER NOTE: Interpretation of information contained herein should be left to medically-trained personnel. Specific patient instructions are provided elsewhere under "Patient Instructions" section of medical record. This document was created in part using STT-dictation technology, any transcriptional errors that may result from this process are unintentional.  Patient: [name] [name] Type: Established DOB: Jun 02, 1940 MRN: [id number] PCP: [name], [name]  Service: Procedure DOS: 05/26/2023 Setting: Ambulatory Location: Ambulatory outpatient facility Delivery: Face-to-face Provider: Cephus Collin, MD Specialty: Interventional Pain Management Specialty designation: 09 Location: Outpatient facility Ref. Prov.: [name], [name]       Interventional Therapy   Type: Lumbar Facet, Medial Branch Block(s) (w/ fluoroscopic mapping) #1  Laterality: Bilateral  Level: L3, L4, and L5 Medial Branch Level(s). Injecting these levels blocks the L3-4 and L4-5 lumbar facet joints.  Imaging: Fluoroscopic guidance Spinal ([id number]) Anesthesia: Local anesthesia (1-2% Lidocaine ) Sedation: Minimal Sedation                       DOS: 05/26/2023 Performed by: Cephus Collin, MD  Primary Purpose: Diagnostic/Therapeutic Indications: Low back pain severe enough to impact quality of life or function. 1. Lumbar spondylosis   2. Lumbar facet arthropathy    NAS-11 Pain score:   Pre-procedure: 2 /10   Post-procedure: 2 /10  Patient stopped Plavix  7 days prior taking 81 aspirin  instead    Position / Prep / Materials:  Position: Prone  Prep solution: ChloraPrep (2% chlorhexidine  gluconate and 70% isopropyl alcohol ) Area Prepped: Posterolateral Lumbosacral Spine (Wide prep: From the lower border of the scapula down to the end of the tailbone and from flank to flank.)  Materials:  Tray: Block Needle(s):  Type: Spinal  Gauge (G): 22  Length: 3.5-in Qty: 3     H&P (Pre-op Assessment):  Mr.  Rousselle is a 83 y.o. (year old), male patient, seen today for interventional treatment. He  has a past surgical history that includes Cholecystectomy (2012); Tonsillectomy (1945); Lithotripsy (2015); Melanoma excision (1980); Nerve Surgery (2015); Coronary angioplasty with stent (1991 & 2005); Wisdom tooth extraction; Colonoscopy; Esophagogastroduodenoscopy (egd) with propofol  (N/A, 11/19/2016); Cystoscopy/ureteroscopy/holmium laser/stent placement (Right, 01/02/2018); Esophagogastroduodenoscopy (egd) with propofol  (N/A, 02/02/2019); LEFT HEART CATH AND CORONARY ANGIOGRAPHY (N/A, 07/07/2019); CORONARY PRESSURE/FFR STUDY (N/A, 07/07/2019); CORONARY ATHERECTOMY (N/A, 07/08/2019); Left Heart Cath (N/A, 07/08/2019); Coronary Ultrasound/IVUS (N/A, 07/08/2019); CORONARY STENT INTERVENTION (N/A, 07/08/2019); CORONARY BALLOON ANGIOPLASTY (N/A, 07/08/2019); ICD IMPLANT (N/A, 02/02/2020); LEAD REVISION/REPAIR (N/A, 02/16/2020); BIV UPGRADE (N/A, 02/26/2021); pre cancer lesion from scalp (09/2022); and RIGHT HEART CATH AND CORONARY ANGIOGRAPHY (Bilateral, 11/26/2022). [name] has a current medication list which includes the following prescription(s): amiodarone , aspirin  ec, azelastine , bisacodyl, carvedilol , clopidogrel , cyclobenzaprine , dextromethorphan-guaifenesin, ezetimibe , famotidine, gabapentin , ginger (zingiber officinalis), glipizide , dry eye relief drops, lisinopril , midodrine , multivitamin, NON FORMULARY, pantoprazole , psyllium, rosuvastatin , semaglutide  (1 mg/dose), sodium chloride , spironolactone , tamsulosin , tizanidine , tramadol , UNABLE TO FIND, onetouch verio flex system, blood glucose monitoring suppl, continuous glucose monitor sup, continuous glucose receiver, glucosamine, onetouch ultrasoft, omega-3 fatty acids, and onetouch verio. His primarily concern today is the Back Pain (Lumbar bilateral )  Initial Vital Signs:  Pulse/HCG Rate: 65ECG Heart Rate: 60 Temp: (!) 97.3 F (36.3 C) Resp: 16 BP:  135/82 SpO2: 98 %  BMI: Estimated body mass index is 26.52 kg/m as calculated from the following:   Height as of this encounter: 6\' 1"  (1.854 m).   Weight as of this encounter: 201 lb (91.2 kg).  Risk Assessment: Allergies: Reviewed. He is allergic to metformin  and related, azithromycin, other, percocet [oxycodone -acetaminophen ], and  jardiance  [empagliflozin ].  Allergy Precautions: None required Coagulopathies: Reviewed. None identified.  Blood-thinner therapy: None at this time Active Infection(s): Reviewed. None identified. [name] is afebrile  Site Confirmation: [name] was asked to confirm the procedure and laterality before marking the site Procedure checklist: Completed Consent: Before the procedure and under the influence of no sedative(s), amnesic(s), or anxiolytics, the patient was informed of the treatment options, risks and possible complications. To fulfill our ethical and legal obligations, as recommended by the American Medical Association's Code of Ethics, I have informed the patient of my clinical impression; the nature and purpose of the treatment or procedure; the risks, benefits, and possible complications of the intervention; the alternatives, including doing nothing; the risk(s) and benefit(s) of the alternative treatment(s) or procedure(s); and the risk(s) and benefit(s) of doing nothing. The patient was provided information about the general risks and possible complications associated with the procedure. These may include, but are not limited to: failure to achieve desired goals, infection, bleeding, organ or nerve damage, allergic reactions, paralysis, and death. In addition, the patient was informed of those risks and complications associated to Spine-related procedures, such as failure to decrease pain; infection (i.e.: Meningitis, epidural or intraspinal abscess); bleeding (i.e.: epidural hematoma, subarachnoid hemorrhage, or any other type of intraspinal or  peri-dural bleeding); organ or nerve damage (i.e.: Any type of peripheral nerve, nerve root, or spinal cord injury) with subsequent damage to sensory, motor, and/or autonomic systems, resulting in permanent pain, numbness, and/or weakness of one or several areas of the body; allergic reactions; (i.e.: anaphylactic reaction); and/or death. Furthermore, the patient was informed of those risks and complications associated with the medications. These include, but are not limited to: allergic reactions (i.e.: anaphylactic or anaphylactoid reaction(s)); adrenal axis suppression; blood sugar elevation that in diabetics may result in ketoacidosis or comma; water retention that in patients with history of congestive heart failure may result in shortness of breath, pulmonary edema, and decompensation with resultant heart failure; weight gain; swelling or edema; medication-induced neural toxicity; particulate matter embolism and blood vessel occlusion with resultant organ, and/or nervous system infarction; and/or aseptic necrosis of one or more joints. Finally, the patient was informed that Medicine is not an exact science; therefore, there is also the possibility of unforeseen or unpredictable risks and/or possible complications that may result in a catastrophic outcome. The patient indicated having understood very clearly. We have given the patient no guarantees and we have made no promises. Enough time was given to the patient to ask questions, all of which were answered to the patient's satisfaction. Mr. Gush has indicated that he wanted to continue with the procedure. Attestation: I, the ordering provider, attest that I have discussed with the patient the benefits, risks, side-effects, alternatives, likelihood of achieving goals, and potential problems during recovery for the procedure that I have provided informed consent. Date  Time: 05/26/2023 10:39 AM  Pre-Procedure Preparation:  Monitoring: As per clinic  protocol. Respiration, ETCO2, SpO2, BP, heart rate and rhythm monitor placed and checked for adequate function Safety Precautions: Patient was assessed for positional comfort and pressure points before starting the procedure. Time-out: I initiated and conducted the "Time-out" before starting the procedure, as per protocol. The patient was asked to participate by confirming the accuracy of the "Time Out" information. Verification of the correct person, site, and procedure were performed and confirmed by me, the nursing staff, and the patient. "Time-out" conducted as per Joint Commission's Universal Protocol (UP.01.01.01). Time: 1145 Start Time: 1145 hrs.  Description of Procedure:          Laterality: (see above) Targeted Levels: (see above)  Safety Precautions: Aspiration looking for blood return was conducted prior to all injections. At no point did we inject any substances, as a needle was being advanced. Before injecting, the patient was told to immediately notify me if he was experiencing any new onset of "ringing in the ears, or metallic taste in the mouth". No attempts were made at seeking any paresthesias. Safe injection practices and needle disposal techniques used. Medications properly checked for expiration dates. SDV (single dose vial) medications used. After the completion of the procedure, all disposable equipment used was discarded in the proper designated medical waste containers. Local Anesthesia: Protocol guidelines were followed. The patient was positioned over the fluoroscopy table. The area was prepped in the usual manner. The time-out was completed. The target area was identified using fluoroscopy. A 12-in long, straight, sterile hemostat was used with fluoroscopic guidance to locate the targets for each level blocked. Once located, the skin was marked with an approved surgical skin marker. Once all sites were marked, the skin (epidermis, dermis, and hypodermis), as well as deeper  tissues (fat, connective tissue and muscle) were infiltrated with a small amount of a short-acting local anesthetic, loaded on a 10cc syringe with a 25G, 1.5-in  Needle. An appropriate amount of time was allowed for local anesthetics to take effect before proceeding to the next step. Local Anesthetic: Lidocaine  2.0% The unused portion of the local anesthetic was discarded in the proper designated containers. Technical description of process:   L3 Medial Branch Nerve Block (MBB): The target area for the L3 medial branch is at the junction of the postero-lateral aspect of the superior articular process and the superior, posterior, and medial edge of the transverse process of L4. Under fluoroscopic guidance, a Quincke needle was inserted until contact was made with os over the superior postero-lateral aspect of the pedicular shadow (target area). After negative aspiration for blood, 2mL of the nerve block solution was injected without difficulty or complication. The needle was removed intact. L4 Medial Branch Nerve Block (MBB): The target area for the L4 medial branch is at the junction of the postero-lateral aspect of the superior articular process and the superior, posterior, and medial edge of the transverse process of L5. Under fluoroscopic guidance, a Quincke needle was inserted until contact was made with os over the superior postero-lateral aspect of the pedicular shadow (target area). After negative aspiration for blood, 2mL of the nerve block solution was injected without difficulty or complication. The needle was removed intact. L5 Medial Branch Nerve Block (MBB): The target area for the L5 medial branch is at the junction of the postero-lateral aspect of the superior articular process and the superior, posterior, and medial edge of the sacral ala. Under fluoroscopic guidance, a Quincke needle was inserted until contact was made with os over the superior postero-lateral aspect of the pedicular shadow  (target area). After negative aspiration for blood, 2 mL of the nerve block solution was injected without difficulty or complication. The needle was removed intact.   Once the entire procedure was completed, the treated area was cleaned, making sure to leave some of the prepping solution back to take advantage of its long term bactericidal properties.         Illustration of the posterior view of the lumbar spine and the posterior neural structures. Laminae of L2 through S1 are labeled. DPRL5, dorsal primary ramus of  L5; DPRS1, dorsal primary ramus of S1; DPR3, dorsal primary ramus of L3; FJ, facet (zygapophyseal) joint L3-L4; I, inferior articular process of L4; LB1, lateral branch of dorsal primary ramus of L1; IAB, inferior articular branches from L3 medial branch (supplies L4-L5 facet joint); IBP, intermediate branch plexus; MB3, medial branch of dorsal primary ramus of L3; NR3, third lumbar nerve root; S, superior articular process of L5; SAB, superior articular branches from L4 (supplies L4-5 facet joint also); TP3, transverse process of L3.   Facet Joint Innervation (* possible contribution)  L1-2 T12, L1 (L2*)  Medial Branch  L2-3 L1, L2 (L3*)         "          "  L3-4 L2, L3 (L4*)         "          "  L4-5 L3, L4 (L5*)         "          "  L5-S1 L4, L5, S1          "          "    Vitals:   05/26/23 1044 05/26/23 1142 05/26/23 1147  BP: 135/82 (!) 130/95 (!) 139/93  Pulse: 65    Resp: 16 18 17   Temp: (!) 97.3 F (36.3 C)    TempSrc: Temporal    SpO2: 98% 99% 99%  Weight: 201 lb (91.2 kg)    Height: 6\' 1"  (1.854 m)       End Time: 1151 hrs.  Imaging Guidance (Spinal):          Type of Imaging Technique: Fluoroscopy Guidance (Spinal) Indication(s): Fluoroscopy guidance for needle placement to enhance accuracy in procedures requiring precise needle localization for targeted delivery of medication in or near specific anatomical locations not easily accessible without such  real-time imaging assistance. Exposure Time: Please see nurses notes. Contrast: None used. Fluoroscopic Guidance: I was personally present during the use of fluoroscopy. "Tunnel Vision Technique" used to obtain the best possible view of the target area. Parallax error corrected before commencing the procedure. "Direction-depth-direction" technique used to introduce the needle under continuous pulsed fluoroscopy. Once target was reached, antero-posterior, oblique, and lateral fluoroscopic projection used confirm needle placement in all planes. Images permanently stored in EMR. Interpretation: No contrast injected. I personally interpreted the imaging intraoperatively. Adequate needle placement confirmed in multiple planes. Permanent images saved into the patient's record.  Post-operative Assessment:  Post-procedure Vital Signs:  Pulse/HCG Rate: 65(!) 54 Temp: (!) 97.3 F (36.3 C) Resp: 17 BP: (!) 139/93 SpO2: 99 %  EBL: None  Complications: No immediate post-treatment complications observed by team, or reported by patient.  Note: The patient tolerated the entire procedure well. A repeat set of vitals were taken after the procedure and the patient was kept under observation following institutional policy, for this type of procedure. Post-procedural neurological assessment was performed, showing return to baseline, prior to discharge. The patient was provided with post-procedure discharge instructions, including a section on how to identify potential problems. Should any problems arise concerning this procedure, the patient was given instructions to immediately contact us , at any time, without hesitation. In any case, we plan to contact the patient by telephone for a follow-up status report regarding this interventional procedure.  Comments:  No additional relevant information.  Plan of Care (POC)  Okay to restart Plavix  tomorrow.  Orders:  Orders Placed This Encounter  Procedures   DG PAIN  CLINIC C-ARM 1-60 MIN NO REPORT    Intraoperative interpretation by procedural physician at [facility].    Standing Status:   Standing    Number of Occurrences:   1    Reason for exam::   Assistance in needle guidance and placement for procedures requiring needle placement in or near specific anatomical locations not easily accessible without such assistance.    Medications ordered for procedure: Meds ordered this encounter  Medications   lidocaine  (XYLOCAINE ) 2 % (with pres) injection 400 mg   diazepam (VALIUM) tablet 5 mg    Make sure Flumazenil is available in the pyxis when using this medication. If oversedation occurs, administer 0.2 mg IV over 15 sec. If after 45 sec no response, administer 0.2 mg again over 1 min; may repeat at 1 min intervals; not to exceed 4 doses (1 mg)   ropivacaine (PF) 2 mg/mL (0.2%) (NAROPIN) injection 18 mL   dexamethasone  (DECADRON ) injection 20 mg   Medications administered: We administered lidocaine , diazepam, ropivacaine (PF) 2 mg/mL (0.2%), and dexamethasone .  See the medical record for exact dosing, route, and time of administration.  Follow-up plan:   Return in about 3 weeks (around 06/16/2023) for PPE, F2F.       BLF L3-5 05/26/23    Recent Visits Date Type Provider Dept  05/01/23 Office Visit Cephus Collin, MD [facility]  Showing recent visits within past 90 days and meeting all other requirements Today's Visits Date Type Provider Dept  05/26/23 Procedure visit Cephus Collin, MD [facility]  Showing today's visits and meeting all other requirements Future Appointments Date Type Provider Dept  06/17/23 Appointment Cephus Collin, MD [facility]  Showing future appointments within next 90 days and meeting all other requirements  Disposition: Discharge home  Discharge (Date  Time): 05/26/2023; 1200 hrs.   Primary Care Physician: [name], [name] Location: [facility]  Facility Note by: Cephus Collin, MD (TTS technology used. I apologize for any typographical errors that were not detected and corrected.) Date: 05/26/2023; Time: 12:23 PM  Disclaimer:  Medicine is not an Visual merchandiser. The only guarantee in medicine is that nothing is guaranteed. It is important to note that the decision to proceed with this intervention was based on the information collected from the patient. The Data and conclusions were drawn from the patient's questionnaire, the interview, and the physical examination. Because the information was provided in large part by the patient, it cannot be guaranteed that it has not been purposely or unconsciously manipulated. Every effort has been made to obtain as much relevant data as possible for this evaluation. It is important to note that the conclusions that lead to this procedure are derived in large part from the available data. Always take into account that the treatment will also be dependent on availability of resources and existing treatment guidelines, considered by other Pain Management Practitioners as being common knowledge and practice, at the time of the intervention. For Medico-Legal purposes, it is also important to point out that variation in procedural techniques and pharmacological choices are the acceptable norm. The indications, contraindications, technique, and results of the above procedure should only be interpreted and judged by a Board-Certified Interventional Pain Specialist with extensive familiarity and expertise in the same exact procedure and technique.

## 2023-05-27 ENCOUNTER — Telehealth: Payer: Self-pay | Admitting: *Deleted

## 2023-05-27 ENCOUNTER — Encounter: Payer: Self-pay | Admitting: Family

## 2023-05-27 ENCOUNTER — Ambulatory Visit (INDEPENDENT_AMBULATORY_CARE_PROVIDER_SITE_OTHER): Payer: Medicare Other

## 2023-05-27 ENCOUNTER — Ambulatory Visit: Payer: Self-pay

## 2023-05-27 ENCOUNTER — Telehealth: Payer: Self-pay

## 2023-05-27 DIAGNOSIS — I442 Atrioventricular block, complete: Secondary | ICD-10-CM | POA: Diagnosis not present

## 2023-05-27 NOTE — Telephone Encounter (Signed)
 Spoke to pt went over message below pt stated that he would just like to monitor it for now and see how things go, and if he sees that it is too high, he will reach back out to us  if need be

## 2023-05-27 NOTE — Telephone Encounter (Signed)
 Copied from CRM [id number]. Topic: Clinical - Medication Question >> May 27, 2023  2:25 PM [name] [name] wrote: Reason for CRM:  patient is taking steroids for his back which increased his sugar and he wants to know if he can increase the dosage on glipiZIDE  (GLUCOTROL ) 10 MG tablet Patient would like a call back

## 2023-05-27 NOTE — Telephone Encounter (Signed)
 Advised patient to contact managing physician to ask if hyperglycemia should be treated.

## 2023-05-27 NOTE — Telephone Encounter (Signed)
 Spoke to pt he stated that  he is feeling ok but his sugars are up and he just would like to know if he can increase the glipizide 

## 2023-05-27 NOTE — Telephone Encounter (Signed)
 Attempted to call for post procedure follow-up. Message left.

## 2023-05-27 NOTE — Telephone Encounter (Signed)
 Telephone call from patient, returning the nurse call. Patient states that he is doing ok, however has a concern about his blood sugar. Patient states that he is diabetic, His morning glucose was 120, his noon glucose was 270. Patient is currently taking Glipizide  and Ozempic .

## 2023-05-28 LAB — CUP PACEART REMOTE DEVICE CHECK
Battery Remaining Longevity: 120 mo
Battery Remaining Percentage: 100 %
Brady Statistic RA Percent Paced: 9 %
Brady Statistic RV Percent Paced: 100 %
Date Time Interrogation Session: 20250513010000
HighPow Impedance: 77 Ohm
Implantable Lead Connection Status: 753985
Implantable Lead Connection Status: 753985
Implantable Lead Connection Status: 753985
Implantable Lead Implant Date: 20220202
Implantable Lead Implant Date: 20230213
Implantable Lead Implant Date: 20230213
Implantable Lead Location: 753858
Implantable Lead Location: 753859
Implantable Lead Location: 753860
Implantable Lead Model: 273
Implantable Lead Model: 4671
Implantable Lead Model: 7841
Implantable Lead Serial Number: 111255
Implantable Lead Serial Number: 1233258
Implantable Lead Serial Number: 861310
Implantable Pulse Generator Implant Date: 20230213
Lead Channel Impedance Value: 392 Ohm
Lead Channel Impedance Value: 587 Ohm
Lead Channel Impedance Value: 797 Ohm
Lead Channel Pacing Threshold Amplitude: 0.9 V
Lead Channel Pacing Threshold Amplitude: 1.3 V
Lead Channel Pacing Threshold Pulse Width: 0.4 ms
Lead Channel Pacing Threshold Pulse Width: 0.4 ms
Lead Channel Setting Pacing Amplitude: 1.9 V
Lead Channel Setting Pacing Amplitude: 2 V
Lead Channel Setting Pacing Amplitude: 2 V
Lead Channel Setting Pacing Pulse Width: 0.4 ms
Lead Channel Setting Pacing Pulse Width: 0.4 ms
Lead Channel Setting Sensing Sensitivity: 0.6 mV
Lead Channel Setting Sensing Sensitivity: 1 mV
Pulse Gen Serial Number: 279753

## 2023-05-28 NOTE — Telephone Encounter (Signed)
 noted

## 2023-05-30 NOTE — Progress Notes (Signed)
 Referring Physician:  No referring provider defined for this encounter.  Primary Physician:  [name], [name]  History of Present Illness: [name] [name] has a history of CAD, DM, GERD, heart murmur, MI, hyperlipidemia, HTN, and melanoma.   Last seen by me on 04/10/23 for LBP and bilateral leg pain. He has known slip at L4-L5 mild central stenosis and moderate bilateral foraminal stenosis. Also with bilateral foraminal stenosis at L3-L4 as well.   He was to continue with HEP from PT and was sent to Dr. Rhesa Celeste for possible injections.   He had bilateral L3-L5 MBB by Dr. Rhesa Celeste on 05/26/23.   He is here for follow up.   He had improvement with MBB- was about 80% better for 2-3 days and now is about 60-70% better. His leg pain is gone. He still has intermittent LBP that is worse with any lifting or prolonged standing- he gets muscle spasms. He is able to walk further and has no pain with sitting.   He had some elevation of his blood sugars after injections, they are trending back to normal. He discussed this with PCP.   He has diabetic neuropathy in both feet.   He is on PLAVIX . Has defibrillator and pacemaker.   Smokes occasional pipe.   Conservative measures:  Physical therapy: 7 visits of PT at [facility] with 80% improvement he was discharged on 08/01/22 Multimodal medical therapy including regular antiinflammatories: neurontin , ultram   Injections:  L3-L5 bilateral MBB 05/26/23 one lumbar injection about 10 years ago - "very helpful"  Past Surgery: no prior spine surgery  [name] has no symptoms of cervical myelopathy.  The symptoms are causing a significant impact on the patient's life.   Review of Systems:  A 10 point review of systems is negative, except for the pertinent positives and negatives detailed in the HPI.  Past Medical History: Past Medical History:  Diagnosis Date   Aortic insufficiency    a. noted on TTE 2015; b. 06/2019 Echo: AI not  visualized.   Arthritis    CAD (coronary artery disease)    a. remote PCI in 1991 and 2005; b. MV 3/15: old inferior MI, no ischemia, LVEF 50%, slight inferior wall hypokniesis; c. 06/2019 NSTEMI/PCI: LM nl, LAD 80p/m (Atherectomy & 4.5x18 Resolute Onyx DES), 82m/d, D1 75 (PTCA), RI patent stent, LCX nl, RCA 100p, RPAV fills via L->R collats from LCX.   Chicken pox    Colon polyps    4 pre-cancerous    Diverticulitis    DM type 2 (diabetes mellitus, type 2) (HCC)    Family history of adverse reaction to anesthesia    GERD (gastroesophageal reflux disease)    Heart murmur    History of kidney stones    HOH (hard of hearing)    Hyperlipidemia    Hypertension    Ischemic cardiomyopathy    a. TTE 2015: EF  50-55%, mild global HK; b. 06/2019 Echo: EF 45-50%, Gr1 DD, basal inf AK. Triv MR.   Kidney stones    Melanoma (HCC) 1980   Resected from his back   Mitral regurgitation    a. noted on TTE 2015   Myocardial infarction [facility])     Past Surgical History: Past Surgical History:  Procedure Laterality Date   BIV UPGRADE N/A 02/26/2021   Procedure: BIV ICD UPGRADE;  Surgeon: Boyce Byes, MD;  Location: [facility] INVASIVE CV LAB;  Service: Cardiovascular;  Laterality: N/A;   CHOLECYSTECTOMY  2012   COLONOSCOPY  in 2003 with polyp removed and leak anastomosis had to have open abdominal surgery    CORONARY ANGIOPLASTY WITH STENT PLACEMENT  1991 & 2005   CORONARY ATHERECTOMY N/A 07/08/2019   Procedure: CORONARY ATHERECTOMY;  Surgeon: Lucendia Rusk, MD;  Location: [facility] INVASIVE CV LAB;  Service: Cardiovascular;  Laterality: N/A;   CORONARY BALLOON ANGIOPLASTY N/A 07/08/2019   Procedure: CORONARY BALLOON ANGIOPLASTY;  Surgeon: Lucendia Rusk, MD;  Location: [facility] INVASIVE CV LAB;  Service: Cardiovascular;  Laterality: N/A;  diagonal    CORONARY PRESSURE/FFR STUDY N/A 07/07/2019   Procedure: INTRAVASCULAR PRESSURE WIRE/FFR STUDY;  Surgeon: Sammy Crisp, MD;  Location: [facility]  INVASIVE CV LAB;  Service: Cardiovascular;  Laterality: N/A;   CORONARY STENT INTERVENTION N/A 07/08/2019   Procedure: CORONARY STENT INTERVENTION;  Surgeon: Lucendia Rusk, MD;  Location: [facility] INVASIVE CV LAB;  Service: Cardiovascular;  Laterality: N/A;  lad   CORONARY ULTRASOUND/IVUS N/A 07/08/2019   Procedure: Intravascular Ultrasound/IVUS;  Surgeon: Lucendia Rusk, MD;  Location: [facility] INVASIVE CV LAB;  Service: Cardiovascular;  Laterality: N/A;   CYSTOSCOPY/URETEROSCOPY/HOLMIUM LASER/STENT PLACEMENT Right 01/02/2018   Procedure: CYSTOSCOPY/URETEROSCOPY/HOLMIUM LASER/STENT PLACEMENT;  Surgeon: Lawerence Pressman, MD;  Location: [facility] ORS;  Service: Urology;  Laterality: Right;   ESOPHAGOGASTRODUODENOSCOPY (EGD) WITH PROPOFOL  N/A 11/19/2016   Procedure: ESOPHAGOGASTRODUODENOSCOPY (EGD) WITH PROPOFOL ;  Surgeon: Marnee Sink, MD;  Location: [facility] ENDOSCOPY;  Service: Endoscopy;  Laterality: N/A;   ESOPHAGOGASTRODUODENOSCOPY (EGD) WITH PROPOFOL  N/A 02/02/2019   Procedure: ESOPHAGOGASTRODUODENOSCOPY (EGD) WITH PROPOFOL ;  Surgeon: Marnee Sink, MD;  Location: [facility] ENDOSCOPY;  Service: Endoscopy;  Laterality: N/A;   ICD IMPLANT N/A 02/02/2020   Procedure: ICD IMPLANT;  Surgeon: Boyce Byes, MD;  Location: [facility] INVASIVE CV LAB;  Service: Cardiovascular;  Laterality: N/A;   LEAD REVISION/REPAIR N/A 02/16/2020   Procedure: LEAD REVISION/REPAIR;  Surgeon: Lei Pump, MD;  Location: [facility] INVASIVE CV LAB;  Service: Cardiovascular;  Laterality: N/A;   LEFT HEART CATH N/A 07/08/2019   Procedure: Left Heart Cath;  Surgeon: Lucendia Rusk, MD;  Location: [facility] INVASIVE CV LAB;  Service: Cardiovascular;  Laterality: N/A;   LEFT HEART CATH AND CORONARY ANGIOGRAPHY N/A 07/07/2019   Procedure: LEFT HEART CATH AND CORONARY ANGIOGRAPHY;  Surgeon: Sammy Crisp, MD;  Location: [facility] INVASIVE CV LAB;  Service: Cardiovascular;  Laterality: N/A;   LITHOTRIPSY  2015   MELANOMA EXCISION  1980    malignant   NERVE SURGERY  2015   ulna nerve   pre cancer lesion from scalp  09/2022   RIGHT HEART CATH AND CORONARY ANGIOGRAPHY Bilateral 11/26/2022   Procedure: RIGHT HEART CATH AND CORONARY ANGIOGRAPHY;  Surgeon: Sammy Crisp, MD;  Location: [facility] INVASIVE CV LAB;  Service: Cardiovascular;  Laterality: Bilateral;   TONSILLECTOMY  1945   WISDOM TOOTH EXTRACTION      Allergies: Allergies as of 06/04/2023 - Review Complete 06/04/2023  Allergen Reaction Noted   Metformin  and related Diarrhea and Other (See Comments) 07/13/2018   Azithromycin Other (See Comments) 07/07/2015   Other Other (See Comments) 07/07/2019   Percocet [oxycodone -acetaminophen ] Other (See Comments) 07/07/2015   Jardiance  [empagliflozin ] Other (See Comments) 08/03/2019    Medications: Outpatient Encounter Medications as of 06/04/2023  Medication Sig   amiodarone  (PACERONE ) 200 MG tablet Take 1 tablet (200 mg total) by mouth daily.   aspirin  EC 81 MG tablet Take 1 tablet (81 mg total) by mouth daily. Swallow whole.   azelastine  (ASTELIN ) 0.1 % nasal spray Place 1 spray into both nostrils 2 (two) times  daily. Use in each nostril as directed (Patient taking differently: Place 1 spray into both nostrils 2 (two) times daily as needed for rhinitis. Use in each nostril as directed)   bisacodyl (DULCOLAX) 5 MG EC tablet Take 5 mg by mouth daily as needed for moderate constipation.   Blood Glucose Monitoring Suppl (ONETOUCH VERIO FLEX SYSTEM) w/Device KIT    Blood Glucose Monitoring Suppl DEVI 1 each by Does not apply route in the morning and at bedtime. May substitute to any manufacturer covered by patient's insurance.   carvedilol  (COREG ) 3.125 MG tablet Take 1 tablet (3.125 mg total) by mouth 2 (two) times daily with a meal.   clopidogrel  (PLAVIX ) 75 MG tablet TAKE 1 TABLET(75 MG) BY MOUTH DAILY   cyclobenzaprine  (FLEXERIL ) 5 MG tablet Take 5 mg by mouth as needed for muscle spasms.   dextromethorphan-guaiFENesin  (MUCINEX DM) 30-600 MG 12hr tablet Take 1 tablet by mouth daily as needed (Sinus Congestion).   ezetimibe  (ZETIA ) 10 MG tablet TAKE 1 TABLET(10 MG) BY MOUTH DAILY   famotidine (PEPCID) 20 MG tablet Take 20 mg by mouth daily with lunch.   gabapentin  (NEURONTIN ) 100 MG capsule TAKE 1 CAPSULE(100 MG) BY MOUTH THREE TIMES DAILY (Patient taking differently: Take 100 mg by mouth at bedtime.)   Ginger, Zingiber officinalis, (GINGER ROOT PO) Take 75 mg by mouth daily.   glipiZIDE  (GLUCOTROL ) 10 MG tablet TAKE 1/2 TABLET BY MOUTH EVERY MORNING WITH BREAKFAST AND 1 TABLET EVERY EVENING WITH SUPPER. (Patient taking differently: TAKE 1 TABLET BY MOUTH EVERY MORNING WITH BREAKFAST AND 1 TABLET EVERY EVENING WITH SUPPER.)   Glucosamine 500 MG CAPS Take 1,500 mg by mouth daily as needed.   Glycerin-Hypromellose-PEG 400 (DRY EYE RELIEF DROPS) 0.2-0.2-1 % SOLN Place 1 drop into both eyes daily.   Lancets (ONETOUCH ULTRASOFT) lancets Use as instructed   lisinopril  (ZESTRIL ) 10 MG tablet Take 1 tablet (10 mg total) by mouth daily.   midodrine  (PROAMATINE ) 5 MG tablet Take 1 tablet (5 mg total) by mouth 3 (three) times daily as needed. Take when BP does drop less than 110 when standing. Additional dose at 2 pm if still dropping below 110 when standing   Multiple Vitamin (MULTIVITAMIN) tablet Take 1 tablet by mouth daily with lunch.   NON FORMULARY [employer] Better Fiber one capsule daily   Contains slipper Elm & psyllium Husk & ginger   Omega-3 Fatty Acids (FISH OIL PO) Take 1,500 mg by mouth daily with lunch.   ONETOUCH VERIO test strip USE TO CHECK BLOOD SUGAR TWICE DAILY AS DIRECTED   pantoprazole  (PROTONIX ) 40 MG tablet Take 1 tablet (40 mg total) by mouth daily.   Psyllium 83 % POWD Take 15 mLs by mouth at bedtime.   rosuvastatin  (CRESTOR ) 10 MG tablet Take 10 mg by mouth every other day.   Semaglutide , 1 MG/DOSE, 4 MG/3ML SOPN Inject 1 mg as directed once a week.   sodium chloride  (OCEAN) 0.65 % SOLN nasal spray  Place 1 spray into both nostrils daily as needed for congestion (Sinus).   spironolactone  (ALDACTONE ) 25 MG tablet TAKE 1 TABLET(25 MG) BY MOUTH DAILY   tamsulosin  (FLOMAX ) 0.4 MG CAPS capsule TAKE ONE CAPSULE BY MOUTH DAILY   tizanidine  (ZANAFLEX ) 2 MG capsule Take 1 capsule (2 mg total) by mouth at bedtime as needed for muscle spasms.   traMADol  (ULTRAM ) 50 MG tablet TAKE 1 TABLET BY MOUTH EVERY 12 HOURS AS NEEDED   UNABLE TO FIND Take 75 mg by mouth  daily. Med Name: SLIPPERY ELM   [DISCONTINUED] Continuous Glucose Monitor Sup MISC 1 Units by Does not apply route daily. (Patient not taking: Reported on 05/06/2023)   [DISCONTINUED] Continuous Glucose Receiver DEVI 1 Units by Does not apply route daily. (Patient not taking: Reported on 05/01/2023)   No facility-administered encounter medications on file as of 06/04/2023.    Social History: Social History   Tobacco Use   Smoking status: Some Days    Types: Pipe    Passive exposure: Current   Smokeless tobacco: Former    Quit date: 10/17/2015  Vaping Use   Vaping status: Never Used  Substance Use Topics   Alcohol  use: Yes    Alcohol /week: 0.0 - 1.0 standard drinks of alcohol     Comment: OCCASIONALLY   Drug use: No    Family Medical History: Family History  Problem Relation Age of Onset   Hyperlipidemia Mother    Hypertension Mother    Heart disease Mother    Diabetes Mother    Heart attack Mother    Colon cancer Father        metasized to liver, adrenal, lungs   Lung cancer Father    Kidney cancer Father        malignant capsulated kidney tumor   Diabetes Father    Liver cancer Father    Bladder Cancer Neg Hx    Prostate cancer Neg Hx     Physical Examination: Vitals:   06/04/23 1031  BP: 114/74     Awake, alert, oriented to person, place, and time.  Speech is clear and fluent. Fund of knowledge is appropriate.   Cranial Nerves: Pupils equal round and reactive to light.  Facial tone is symmetric.    Strength: Side Iliopsoas Quads Hamstring PF DF EHL  R 5 5 5 5 5 5   L 5 5 5 5 5 5    Bilateral lower extremity sensation is intact to light touch.     Gait is normal.    Medical Decision Making  Imaging: none  Assessment and Plan: Mr. Odenthal had improvement with MBB. His leg pain is gone. He still has intermittent LBP that is worse with any lifting or prolonged standing- he gets muscle spasms. He is able to walk further and has no pain with sitting.   He has known slip at L4-L5 with mild central stenosis and moderate bilateral foraminal stenosis. Also with bilateral foraminal stenosis at L3-L4 as well.   He has a pacemaker and defibrillator.   Treatment options discussed with patient and following plan made:   - Follow up with Dr. Rhesa Celeste as scheduled to discuss repeat MBB versus RFA.  - Consider PT after he's done with injections. Will let me know if I need to order.  - Medications limited as he is on PLAVIX . - Follow up with me prn at his request.   I spent a total of 20 minutes in face-to-face and non-face-to-face activities related to this patient's care today including review of outside records, review of imaging, review of symptoms, physical exam, discussion of differential diagnosis, discussion of treatment options, and documentation.   Lucetta Russel PA-C Dept. of Neurosurgery

## 2023-06-01 ENCOUNTER — Ambulatory Visit: Payer: Self-pay | Admitting: Cardiology

## 2023-06-04 ENCOUNTER — Encounter: Payer: Self-pay | Admitting: Orthopedic Surgery

## 2023-06-04 ENCOUNTER — Ambulatory Visit: Admitting: Orthopedic Surgery

## 2023-06-04 VITALS — BP 114/74 | Ht 73.0 in | Wt 201.0 lb

## 2023-06-04 DIAGNOSIS — M4726 Other spondylosis with radiculopathy, lumbar region: Secondary | ICD-10-CM | POA: Diagnosis not present

## 2023-06-04 DIAGNOSIS — M5126 Other intervertebral disc displacement, lumbar region: Secondary | ICD-10-CM | POA: Diagnosis not present

## 2023-06-04 DIAGNOSIS — M5416 Radiculopathy, lumbar region: Secondary | ICD-10-CM

## 2023-06-04 DIAGNOSIS — M47816 Spondylosis without myelopathy or radiculopathy, lumbar region: Secondary | ICD-10-CM

## 2023-06-04 DIAGNOSIS — M48061 Spinal stenosis, lumbar region without neurogenic claudication: Secondary | ICD-10-CM

## 2023-06-04 DIAGNOSIS — M4316 Spondylolisthesis, lumbar region: Secondary | ICD-10-CM

## 2023-06-05 ENCOUNTER — Ambulatory Visit: Admitting: Orthopedic Surgery

## 2023-06-16 ENCOUNTER — Ambulatory Visit: Payer: Medicare Other | Admitting: Podiatry

## 2023-06-16 ENCOUNTER — Encounter: Payer: Self-pay | Admitting: Podiatry

## 2023-06-16 VITALS — Ht 73.0 in | Wt 201.0 lb

## 2023-06-16 DIAGNOSIS — M79674 Pain in right toe(s): Secondary | ICD-10-CM

## 2023-06-16 DIAGNOSIS — B351 Tinea unguium: Secondary | ICD-10-CM | POA: Diagnosis not present

## 2023-06-16 DIAGNOSIS — E1142 Type 2 diabetes mellitus with diabetic polyneuropathy: Secondary | ICD-10-CM | POA: Diagnosis not present

## 2023-06-16 DIAGNOSIS — M79675 Pain in left toe(s): Secondary | ICD-10-CM | POA: Diagnosis not present

## 2023-06-17 ENCOUNTER — Ambulatory Visit
Attending: Student in an Organized Health Care Education/Training Program | Admitting: Student in an Organized Health Care Education/Training Program

## 2023-06-17 ENCOUNTER — Encounter: Payer: Self-pay | Admitting: Student in an Organized Health Care Education/Training Program

## 2023-06-17 VITALS — BP 115/70 | HR 64 | Temp 97.9°F | Resp 18 | Ht 73.0 in | Wt 199.0 lb

## 2023-06-17 DIAGNOSIS — M47816 Spondylosis without myelopathy or radiculopathy, lumbar region: Secondary | ICD-10-CM | POA: Insufficient documentation

## 2023-06-17 DIAGNOSIS — M51362 Other intervertebral disc degeneration, lumbar region with discogenic back pain and lower extremity pain: Secondary | ICD-10-CM | POA: Diagnosis present

## 2023-06-17 DIAGNOSIS — G894 Chronic pain syndrome: Secondary | ICD-10-CM | POA: Diagnosis present

## 2023-06-17 NOTE — Progress Notes (Signed)
 PROVIDER NOTE: Interpretation of information contained herein should be left to medically-trained personnel. Specific patient instructions are provided elsewhere under "Patient Instructions" section of medical record. This document was created in part using AI and STT-dictation technology, any transcriptional errors that may result from this process are unintentional.  Patient: [name] [name]  Service: E/M   PCP: [name], [name]  DOB: 03-Jun-1940  DOS: 06/17/2023  Provider: Cephus Collin, MD  MRN: [id number]  Delivery: Face-to-face  Specialty: Interventional Pain Management  Type: Established Patient  Setting: Ambulatory outpatient facility  Specialty designation: 09  Referring Prov.: [name], [name]  Location: Outpatient office facility       History of present illness (HPI) [name], a 83 y.o. year old male, is here today because of his Lumbar spondylosis [M47.816]. [name] primary complain today is Back Pain (lower)  Pertinent problems: [name] does not have any pertinent problems on file.  Pain Assessment: Severity of Chronic pain is reported as a 6 /10. Location: Back Lower/denies. Onset: More than a month ago. Quality: Spasm. Timing: Intermittent. Modifying factor(s): lying or sitting, rest, Tylenol  arthritis. Vitals:  height is 6\' 1"  (1.854 m) and weight is 199 lb (90.3 kg). His temporal temperature is 97.9 F (36.6 C). His blood pressure is 115/70 and his pulse is 64. His respiration is 18 and oxygen saturation is 97%.  BMI: Estimated body mass index is 26.25 kg/m as calculated from the following:   Height as of this encounter: 6\' 1"  (1.854 m).   Weight as of this encounter: 199 lb (90.3 kg).  Last encounter: 05/01/2023. Last procedure: 05/26/2023.  Reason for encounter:   Post-Procedure Evaluation   Type: Lumbar Facet, Medial Branch Block(s) (w/ fluoroscopic mapping) #1  Laterality: Bilateral  Level: L3, L4, and L5 Medial Branch Level(s).  Injecting these levels blocks the L3-4 and L4-5 lumbar facet joints.  Imaging: Fluoroscopic guidance Spinal ([id number]) Anesthesia: Local anesthesia (1-2% Lidocaine ) Sedation: Minimal Sedation                       DOS: 05/26/2023 Performed by: Cephus Collin, MD  Primary Purpose: Diagnostic/Therapeutic Indications: Low back pain severe enough to impact quality of life or function. 1. Lumbar spondylosis   2. Lumbar facet arthropathy    NAS-11 Pain score:   Pre-procedure: 2 /10   Post-procedure: 2 /10  Patient stopped Plavix  7 days prior taking 81 aspirin  instead    Effectiveness:  Initial hour after procedure: 100 %  Subsequent 4-6 hours post-procedure: 100 %  Analgesia past initial 6 hours: 80% Ongoing improvement:  Analgesic:  80% for 5 days then gradual return to baseline Function: Somewhat improved ROM: Somewhat improved   History of Present Illness   [name] [name] "[name] [name]" is an 83 year old male who presents for follow-up after bilateral L3, L4, L5 lumbar medial branch nerve block performed last month.  He has experienced improvement in walking since the lumbar nerve block, allowing him to move more upright. However, he continues to experience muscle spasms in the lumbar area during activities such as lifting or walking for extended periods, even as short as from one end of a hallway to the other. Muscle spasms are currently the most prevalent issue.  Prior to the nerve block, he experienced nerve issues in his legs, particularly at night, causing them to jerk. These symptoms have resolved since the treatment.  He mentions increasing his fluid intake but acknowledges it is still insufficient, which he  believes may contribute to his muscle spasms.  He is currently using Tylenol  Arthritis for pain management.        UDS:  Summary  Date Value Ref Range Status  05/01/2023 FINAL  Final    Comment:     ==================================================================== Compliance Drug Analysis, Ur ==================================================================== Specimen Alert Not Detected result may be consistent with the time of last use noted for this medication. AS NEEDED (Guaifenesin) ==================================================================== Test                             Result       Flag       Units  Drug Present and Declared for Prescription Verification   Tramadol                        >6579        EXPECTED   ng/mg creat   O-Desmethyltramadol            >6579        EXPECTED   ng/mg creat   N-Desmethyltramadol            772          EXPECTED   ng/mg creat    Source of tramadol  is a prescription medication. O-desmethyltramadol    and N-desmethyltramadol are expected metabolites of tramadol .    Cyclobenzaprine                 PRESENT      EXPECTED   Desmethylcyclobenzaprine       PRESENT      EXPECTED    Desmethylcyclobenzaprine is an expected metabolite of    cyclobenzaprine .  Drug Present not Declared for Prescription Verification   Acetaminophen                   PRESENT      UNEXPECTED  Drug Absent but Declared for Prescription Verification   Gabapentin                      Not Detected UNEXPECTED   Tizanidine                      Not Detected UNEXPECTED    Tizanidine , as indicated in the declared medication list, is not    always detected even when used as directed.    Salicylate                     Not Detected UNEXPECTED    Aspirin , as indicated in the declared medication list, is not always    detected even when used as directed.    Dextromethorphan               Not Detected UNEXPECTED   Guaifenesin                    Not Detected UNEXPECTED ==================================================================== Test                      Result    Flag   Units      Ref Range   Creatinine              76               mg/dL       >=91 ==================================================================== Declared Medications:  The flagging and interpretation on this report are based  on the  following declared medications.  Unexpected results may arise from  inaccuracies in the declared medications.   **Note: The testing scope of this panel includes these medications:   Cyclobenzaprine  (Flexeril )  Dextromethorphan  Gabapentin  (Neurontin )  Guaifenesin (Mucinex)  Tramadol  (Ultram )   **Note: The testing scope of this panel does not include small to  moderate amounts of these reported medications:   Aspirin   Tizanidine  (Zanaflex )   **Note: The testing scope of this panel does not include the  following reported medications:   Amiodarone  (Pacerone )  Azelastine  (Astelin )  Bisacodyl  Carvedilol  (Coreg )  Clopidogrel  (Plavix )  Eye Drops  Ezetimibe  (Zetia )  Famotidine (Pepcid)  Glipizide  (Glucotrol )  Glucosamine  Lisinopril  (Zestril )  Midodrine  (Proamatine )  Multivitamin  Omega-3 Fatty Acids  Pantoprazole  (Protonix )  Psyllium  Rosuvastatin  (Crestor )  Semaglutide  (Ozempic )  Sodium Chloride   Spironolactone  (Aldactone )  Supplement (Ginger)  Tamsulosin  (Flomax ) ==================================================================== For clinical consultation, please call [phone]. ====================================================================      ROS  Constitutional: Denies any fever or chills Gastrointestinal: No reported hemesis, hematochezia, vomiting, or acute GI distress Musculoskeletal: Positive low back pain Neurological: No reported episodes of acute onset apraxia, aphasia, dysarthria, agnosia, amnesia, paralysis, loss of coordination, or loss of consciousness  Medication Review  Blood Glucose Monitoring Suppl, Ginger (Zingiber officinalis), Glucosamine, Glycerin-Hypromellose-PEG 400, NON FORMULARY, Omega-3 Fatty Acids, OneTouch Verio Flex System, Psyllium, Semaglutide  (1 MG/DOSE),  UNABLE TO FIND, amiodarone , aspirin  EC, azelastine , bisacodyl, carvedilol , clopidogrel , cyclobenzaprine , dextromethorphan-guaiFENesin, ezetimibe , famotidine, gabapentin , glipiZIDE , glucose blood, lisinopril , midodrine , multivitamin, onetouch ultrasoft, pantoprazole , rosuvastatin , sodium chloride , spironolactone , tamsulosin , tizanidine , and traMADol   History Review  Allergy: Mr. Ambroise is allergic to metformin  and related, azithromycin, other, percocet [oxycodone -acetaminophen ], and jardiance  [empagliflozin ]. Drug: Mr. Bayle  reports no history of drug use. Alcohol :  reports current alcohol  use. Tobacco:  reports that he has been smoking pipe. He has been exposed to tobacco smoke. He quit smokeless tobacco use about 7 years ago. Social: Mr. Cadavid  reports that he has been smoking pipe. He has been exposed to tobacco smoke. He quit smokeless tobacco use about 7 years ago. He reports current alcohol  use. He reports that he does not use drugs. Medical:  has a past medical history of Aortic insufficiency, Arthritis, CAD (coronary artery disease), Chicken pox, Colon polyps, Diverticulitis, DM type 2 (diabetes mellitus, type 2) (HCC), Family history of adverse reaction to anesthesia, GERD (gastroesophageal reflux disease), Heart murmur, History of kidney stones, HOH (hard of hearing), Hyperlipidemia, Hypertension, Ischemic cardiomyopathy, Kidney stones, Melanoma (HCC) (1980), Mitral regurgitation, and Myocardial infarction (HCC). Surgical: Mr. Mallick  has a past surgical history that includes Cholecystectomy (2012); Tonsillectomy (1945); Lithotripsy (2015); Melanoma excision (1980); Nerve Surgery (2015); Coronary angioplasty with stent (1991 & 2005); Wisdom tooth extraction; Colonoscopy; Esophagogastroduodenoscopy (egd) with propofol  (N/A, 11/19/2016); Cystoscopy/ureteroscopy/holmium laser/stent placement (Right, 01/02/2018); Esophagogastroduodenoscopy (egd) with propofol  (N/A, 02/02/2019); LEFT HEART  CATH AND CORONARY ANGIOGRAPHY (N/A, 07/07/2019); CORONARY PRESSURE/FFR STUDY (N/A, 07/07/2019); CORONARY ATHERECTOMY (N/A, 07/08/2019); Left Heart Cath (N/A, 07/08/2019); Coronary Ultrasound/IVUS (N/A, 07/08/2019); CORONARY STENT INTERVENTION (N/A, 07/08/2019); CORONARY BALLOON ANGIOPLASTY (N/A, 07/08/2019); ICD IMPLANT (N/A, 02/02/2020); LEAD REVISION/REPAIR (N/A, 02/16/2020); BIV UPGRADE (N/A, 02/26/2021); pre cancer lesion from scalp (09/2022); and RIGHT HEART CATH AND CORONARY ANGIOGRAPHY (Bilateral, 11/26/2022). Family: family history includes Colon cancer in his father; Diabetes in his father and mother; Heart attack in his mother; Heart disease in his mother; Hyperlipidemia in his mother; Hypertension in his mother; Kidney cancer in his father; Liver cancer in his father; Lung cancer in his father.  Laboratory  Chemistry Profile   Renal Lab Results  Component Value Date   BUN 22 11/22/2022   CREATININE 1.04 11/22/2022   BCR 21 11/22/2022   GFR 73.48 10/23/2022   GFRAA >60 07/09/2019   GFRNONAA >60 02/09/2022    Hepatic Lab Results  Component Value Date   AST 46 (H) 10/23/2022   ALT 37 10/23/2022   ALBUMIN 3.9 10/23/2022   ALKPHOS 76 10/23/2022   HCVAB NON REACTIVE 03/25/2019   LIPASE 55.0 09/09/2016    Electrolytes Lab Results  Component Value Date   NA 139 11/26/2022   K 4.0 11/26/2022   CL 105 11/22/2022   CALCIUM  9.2 11/22/2022   MG 1.4 (L) 05/20/2020    Bone Lab Results  Component Value Date   VD25OH 33.0 01/08/2017    Inflammation (CRP: Acute Phase) (ESR: Chronic Phase) Lab Results  Component Value Date   CRP <0.8 01/08/2017   ESRSEDRATE 10 01/08/2017         Note: Above Lab results reviewed.  Recent Imaging Review  CT LUMBAR SPINE WO CONTRAST   Narrative CLINICAL DATA:  Provided history: Lumbar spondylosis. Lumbar radiculopathy. Spondylolisthesis of lumbar region. Additional history provided: Low back pain with right lateral thigh pain.   EXAM: CT  LUMBAR SPINE WITHOUT CONTRAST   TECHNIQUE: Multidetector CT imaging of the lumbar spine was performed without intravenous contrast administration. Multiplanar CT image reconstructions were also generated.   RADIATION DOSE REDUCTION: This exam was performed according to the departmental dose-optimization program which includes automated exposure control, adjustment of the mA and/or kV according to patient size and/or use of iterative reconstruction technique.   COMPARISON:  Lumbar spine radiographs 02/12/2022. CT abdomen/pelvis 03/16/2021.   FINDINGS: Segmentation: 5 lumbar vertebrae. The caudal most well-formed intervertebral disc space is designated L5-S1.   Alignment: Slight grade 1 retrolisthesis at L1-L2 and L2-L3. 7 mm L4-L5 grade 1 anterolisthesis. Slight grade 1 retrolisthesis at L5-S1.   Vertebrae: Vertebral body height is maintained. No lumbar spine fracture.   Paraspinal and other soft tissues: Bilateral nephrolithiasis. Known bilateral fluid-filled renal lesions, some with associated rim calcification, incompletely imaged and incompletely assessed on the current exam. Aortoiliac atherosclerosis. No paraspinal mass or collection.   Disc levels:   Multilevel disc space narrowing, greatest at L4-L5 and L5-S1 (mild-to-moderate at these levels). L5-S1 disc vacuum phenomenon.   T12-L1: No significant disc herniation or stenosis.   L1-L2: Partially calcified disc bulge. Mild effacement of the ventral thecal sac. No significant foraminal stenosis.   L2-L3: Disc bulge. Mild facet arthrosis. No significant spinal canal stenosis. Mild relative right neural foraminal narrowing.   L3-L4: Disc bulge with mild endplate spurring. Ligamentum flavum hypertrophy. Mild bilateral subarticular narrowing. No significant central canal stenosis. Mild-to-moderate bilateral neural foraminal narrowing.   L4-L5: 7 mm grade 1 anterolisthesis. Disc uncovering with disc bulge. Advanced  facet arthrosis. Bilateral subarticular narrowing. Mild relative narrowing of the central canal. Moderate bilateral neural foraminal narrowing with potential to affect either exiting L4 nerve root.   L5-S1: Disc bulge. Mild facet arthrosis. No significant spinal canal stenosis. Mild relative left neural foraminal narrowing.   Other: Osseous fusion across the anterior aspect of the right sacroiliac joint.   IMPRESSION: 1. Lumbar spondylosis, as outlined and with findings most notably as follows. 2. At L4-L5, there is 7 mm grade 1 anterolisthesis. Mild-to-moderate disc degeneration. Disc uncovering with disc bulge. Advanced facet arthrosis. Bilateral subarticular narrowing. Mild relative narrowing of the central canal. Moderate bilateral neural foraminal narrowing with potential to affect  either exiting L4 nerve root. 3. No more than mild spinal canal narrowing at the remaining lumbar levels. Additional sites of foraminal stenosis, as described and greatest bilaterally at L3-L4 (mild-to-moderate at this level). 4. Osseous fusion across the anterior aspect of the right sacroiliac joint. 5. Bilateral nephrolithiasis. 6. Known bilateral fluid-filled renal lesions, some with associated rim calcification, incompletely imaged and incompletely assessed on the current exam. Please refer to the prior CT abdomen/pelvis of 03/16/2021 for further description and for follow-up recommendations. 7.  Aortic Atherosclerosis (ICD10-I70.0). Note: Reviewed         Physical Exam  General appearance: Well nourished, well developed, and well hydrated. In no apparent acute distress Mental status: Alert, oriented x 3 (person, place, & time)       Respiratory: No evidence of acute respiratory distress Eyes: PERLA Vitals: BP 115/70 (Cuff Size: Normal)   Pulse 64   Temp 97.9 F (36.6 C) (Temporal)   Resp 18   Ht 6\' 1"  (1.854 m)   Wt 199 lb (90.3 kg)   SpO2 97%   BMI 26.25 kg/m  BMI: Estimated body  mass index is 26.25 kg/m as calculated from the following:   Height as of this encounter: 6\' 1"  (1.854 m).   Weight as of this encounter: 199 lb (90.3 kg). Ideal: Ideal body weight: 79.9 kg (176 lb 2.4 oz) Adjusted ideal body weight: 84 kg (185 lb 4.6 oz)  Lumbar Spine Area Exam  Skin & Axial Inspection: No masses, redness, or swelling Alignment: Symmetrical Functional ROM: Pain restricted ROM       Stability: No instability detected Muscle Tone/Strength: Functionally intact. No obvious neuro-muscular anomalies detected. Sensory (Neurological): Musculoskeletal pain pattern Palpation: No palpable anomalies       Provocative Tests: Hyperextension/rotation test: (+) bilaterally for facet joint pain. Lumbar quadrant test (Kemp's test): (+) bilaterally for facet joint pain.   Gait & Posture Assessment  Ambulation: Unassisted Gait: Relatively normal for age and body habitus Posture: WNL  Lower Extremity Exam      Side: Right lower extremity   Side: Left lower extremity  Stability: No instability observed           Stability: No instability observed          Skin & Extremity Inspection: Skin color, temperature, and hair growth are WNL. No peripheral edema or cyanosis. No masses, redness, swelling, asymmetry, or associated skin lesions. No contractures.   Skin & Extremity Inspection: Skin color, temperature, and hair growth are WNL. No peripheral edema or cyanosis. No masses, redness, swelling, asymmetry, or associated skin lesions. No contractures.  Functional ROM: Unrestricted ROM                   Functional ROM: Unrestricted ROM                  Muscle Tone/Strength: Functionally intact. No obvious neuro-muscular anomalies detected.   Muscle Tone/Strength: Functionally intact. No obvious neuro-muscular anomalies detected.  Sensory (Neurological): Unimpaired         Sensory (Neurological): Unimpaired        DTR: Patellar: deferred today Achilles: deferred today Plantar: deferred today    DTR: Patellar: deferred today Achilles: deferred today Plantar: deferred today  Palpation: No palpable anomalies   Palpation: No palpable anomalies    Assessment   Diagnosis Status  1. Lumbar spondylosis   2. Lumbar facet arthropathy   3. Degeneration of intervertebral disc of lumbar region with discogenic back pain and lower extremity  pain   4. Chronic pain syndrome    Responding Responding Responding   Updated Problems: No problems updated.  Plan of Care  Problem-specific:  Assessment and Plan    Chronic low back pain with muscle spasms related to lumbar facet arthropathy: Repeat diagnostic lumbar facet medial branch nerve block #2 Chronic low back pain with muscle spasms has improved with a lumbar nerve block but persists during activities like walking and lifting. Muscle spasms are predominant and may be exacerbated by dehydration. Walking posture has improved, and leg nerve issues have resolved post-treatment. The treatment plan includes two nerve blocks followed by ablation if effective. Current improvement supports scheduling a second lumbar nerve block in four weeks, tentatively July 7th. Increase fluid intake to mitigate dehydration-related muscle spasms. Discontinue Plavix  seven days prior to the procedure and bridge with aspirin . Administer IV sedation for enhanced comfort during the procedure. Schedule the procedure early morning to meet fasting requirements.       [name] [name] has a current medication list which includes the following long-term medication(s): amiodarone , azelastine , carvedilol , ezetimibe , famotidine, gabapentin , glipizide , lisinopril , pantoprazole , sodium chloride , and spironolactone .  Pharmacotherapy (Medications Ordered): No orders of the defined types were placed in this encounter.  Orders:  Orders Placed This Encounter  Procedures   LUMBAR FACET(MEDIAL BRANCH NERVE BLOCK) MBNB    Diagnosis: Lumbar Facet Syndrome (M47.816); Lumbosacral  Facet Syndrome (M47.817); Lumbar Facet Joint Pain (M54.59) Medical Necessity Statement: 1.Severe chronic axial low back pain causing functional impairment documented by ongoing pain scale assessments. 2.Pain present for longer than 3 months (Chronic) documented to have failed noninvasive conservative therapies. 3.Absence of untreated radiculopathy. 4.There is no radiological evidence of untreated fractures, tumor, infection, or deformity.  Physical Examination Findings: Positive Kemp Maneuver: (Y)  Positive Lumbar Hyperextension-Rotation provocative test: (Y)    Standing Status:   Future    Expiration Date:   09/17/2023    Scheduling Instructions:     Type: Lumbar Facet, Medial Branch Block(s) (w/ fluoroscopic mapping) #2     Laterality: Bilateral      Level: L3, L4, and L5 Medial Branch Level(s). Injecting these levels blocks the L3-4 and L4-5 lumbar facet joints.           Imaging: Fluoroscopic guidance Spinal ([id number])     Anesthesia: Local anesthesia (1-2% Lidocaine )     Sedation: Minimal Sedation    Where will this procedure be performed?:   [facility]     BLF L3-5 05/26/23   Return in about 5 weeks (around 07/21/2023) for B/L L3,4,5 MBNB #2, IV Versed , stop Plavix  7 days prior, ok to take ASA 81 mg.    Recent Visits Date Type Provider Dept  05/26/23 Procedure visit Cephus Collin, MD [facility]  05/01/23 Office Visit Cephus Collin, MD [facility]  Showing recent visits within past 90 days and meeting all other requirements Today's Visits Date Type Provider Dept  06/17/23 Office Visit Cephus Collin, MD [facility]  Showing today's visits and meeting all other requirements Future Appointments Date Type Provider Dept  07/21/23 Appointment Cephus Collin, MD [facility]  Showing future appointments within next 90 days and meeting all other requirements  I discussed the assessment and treatment plan with the patient. The patient  was provided an opportunity to ask questions and all were answered. The patient agreed with the plan and demonstrated an understanding of the instructions.  Patient advised to call back or seek an in-person evaluation if the symptoms  or condition worsens.  Duration of encounter: 30 minutes.  Total time on encounter, as per AMA guidelines included both the face-to-face and non-face-to-face time personally spent by the physician and/or other qualified health care professional(s) on the day of the encounter (includes time in activities that require the physician or other qualified health care professional and does not include time in activities normally performed by clinical staff). Physician's time may include the following activities when performed: Preparing to see the patient (e.g., pre-charting review of records, searching for previously ordered imaging, lab work, and nerve conduction tests) Review of prior analgesic pharmacotherapies. Reviewing PMP Interpreting ordered tests (e.g., lab work, imaging, nerve conduction tests) Performing post-procedure evaluations, including interpretation of diagnostic procedures Obtaining and/or reviewing separately obtained history Performing a medically appropriate examination and/or evaluation Counseling and educating the patient/family/caregiver Ordering medications, tests, or procedures Referring and communicating with other health care professionals (when not separately reported) Documenting clinical information in the electronic or other health record Independently interpreting results (not separately reported) and communicating results to the patient/ family/caregiver Care coordination (not separately reported)  Note by: Cephus Collin, MD (TTS and AI technology used. I apologize for any typographical errors that were not detected and corrected.) Date: 06/17/2023; Time: 4:04 PM

## 2023-06-17 NOTE — Patient Instructions (Signed)
 Preparing for Procedure with Sedation Instructions: . Oral Intake: Do not eat or drink anything for at least 8 hours prior to your procedure. . Transportation: Public transportation is not allowed. Bring an adult driver. The driver must be physically present in our waiting room before any procedure can be started. [name] Physical Assistance: Bring an adult capable of physically assisting you, in the event you need help. . Blood Pressure Medicine: Take your blood pressure medicine with a sip of water the morning of the procedure. . Insulin: Take only  of your normal insulin dose. . Preventing infections: Shower with an antibacterial soap the morning of your procedure. . Build-up your immune system: Take 1000 mg of Vitamin C with every meal (3 times a day) the day prior to your procedure. . Pregnancy: If you are pregnant, call and cancel the procedure. . Sickness: If you have a cold, fever, or any active infections, call and cancel the procedure. . Arrival: You must be in the facility at least 30 minutes prior to your scheduled procedure. . Children: Do not bring children with you. . Dress appropriately: Bring dark clothing that you would not mind if they get stained. . Valuables: Do not bring any jewelry or valuables. Procedure appointments are reserved for interventional treatments only. [name] No Prescription Refills. . No medication changes will be discussed during procedure appointments. . No disability issues will be discussed. [name]

## 2023-06-19 ENCOUNTER — Encounter: Payer: Self-pay | Admitting: Podiatry

## 2023-06-19 NOTE — Progress Notes (Signed)
Subjective:  Patient ID: [name] [name], male    DOB: 1940-11-26,  MRN: [id number]  [name] [name] presents to clinic today for at risk foot care with history of diabetic neuropathy and painful elongated mycotic toenails 1-5 bilaterally which are tender when wearing enclosed shoe gear. Pain is relieved with periodic professional debridement. He states his right great toe feels better since last visit. Chief Complaint  Patient presents with   Nail Problem    Pt is here for [facility] last A1C was 6.7 [name] [name] and LOV was in April.   New problem(s): None.   [name] is [name] [name], [name] Lew, [name].  Allergies  Allergen Reactions   Metformin  And Related Diarrhea and Other (See Comments)    Leg cramps, also   Azithromycin Other (See Comments)    Not recommended for cardiac patients- no reaction   Other Other (See Comments)    If taking antibiotics for awhile, thrush results   Percocet [Oxycodone -Acetaminophen ] Other (See Comments)    Hallucinations   Jardiance  [Empagliflozin ] Other (See Comments)    Tongue itching/ Thrush reaction    Review of Systems: Negative except as noted in the HPI.  Objective: No changes noted in today's physical examination. There were no vitals filed for this visit. [name] [name] is a pleasant 83 y.o. male in NAD. AAO x 3.  Vascular Examination: Capillary refill time immediate b/l. Vascular status intact b/l with palpable pedal pulses. Pedal hair present b/l. No pain with calf compression b/l. Skin temperature gradient WNL b/l. No cyanosis or clubbing b/l. No ischemia or gangrene noted b/l. No edema noted b/l LE.  Neurological Examination: Sensation grossly intact b/l with 10 gram monofilament. Vibratory sensation intact b/l. Pt has subjective symptoms of neuropathy.  Dermatological Examination: Pedal skin with normal turgor, texture and tone b/l.  No open wounds. No interdigital macerations.   Toenails 1-5 b/l thick, discolored, elongated with  subungual debris and pain on dorsal palpation.   No hyperkeratotic nor porokeratotic lesions present on today's visit.  Musculoskeletal Examination: Muscle strength 5/5 to all lower extremity muscle groups bilaterally. Hammertoe(s) 2-5 bilaterally.  Radiographs: None  Last A1c:      Latest Ref Rng & Units 04/24/2023   11:51 AM 01/23/2023   11:03 AM 10/23/2022    9:43 AM  Hemoglobin A1C  Hemoglobin-A1c 4.0 - 5.6 % 6.8  7.4  6.9    Assessment/Plan: 1. Pain due to onychomycosis of toenails of both feet   2. Diabetic polyneuropathy associated with type 2 diabetes mellitus [facility])   Consent given for treatment. Patient examined. All patient's and/or POA's questions/concerns addressed on today's visit. Mycotic toenails 1-5 debrided in length and girth without incident. Continue foot and shoe inspections daily. Monitor blood glucose per [name]/Endocrinologist's recommendations.Continue soft, supportive shoe gear daily. Report any pedal injuries to medical professional. Call office if there are any quesitons/concerns.  Return in about 3 months (around 09/16/2023).  [name] [name], DPM      [place] LOCATION: [address], [place] [address]                   Office [phone]   [place] LOCATION: [address] [place], [place] [address] Office [phone])  [phone]  

## 2023-06-24 ENCOUNTER — Telehealth: Payer: Self-pay

## 2023-06-24 NOTE — Telephone Encounter (Signed)
 Pt has been notified that his pt assistance medication Ozempic  (4 boxes) is here and ready for pickup. Pt stated that he will pickup on 06/25/23. Placed in refrigerator awaiting pickup

## 2023-07-02 ENCOUNTER — Other Ambulatory Visit: Payer: Self-pay | Admitting: Family

## 2023-07-02 DIAGNOSIS — G8929 Other chronic pain: Secondary | ICD-10-CM

## 2023-07-04 NOTE — Telephone Encounter (Signed)
 LVM to call back to schedule in office visit between jul/aug Please give us  a call if you have any questions.  Schedule and document when pt calls back

## 2023-07-10 NOTE — Telephone Encounter (Unsigned)
 Copied from CRM [id number]. Topic: Appointments - Appointment Scheduling >> Jul 10, 2023  4:07 PM Viola F wrote: Patient/patient representative is calling to schedule an appointment. Refer to attachments for appointment information. >> Jul 10, 2023  4:10 PM Viola FALCON wrote: Patient returned Jessicas phone call to schedule appointment, scheduled med f/u for 07/28/23

## 2023-07-10 NOTE — Telephone Encounter (Signed)
 Spoke with pt and he stated that he is not at a place where he can look at his calendar so pt stated that he would call back later to schedule his appt.

## 2023-07-11 NOTE — Progress Notes (Signed)
 Remote ICD transmission.

## 2023-07-11 NOTE — Addendum Note (Signed)
 Addended by: VICCI SELLER A on: 07/11/2023 12:19 PM   Modules accepted: Orders

## 2023-07-12 ENCOUNTER — Other Ambulatory Visit: Payer: Self-pay | Admitting: Family

## 2023-07-16 ENCOUNTER — Other Ambulatory Visit: Payer: Self-pay

## 2023-07-16 DIAGNOSIS — E118 Type 2 diabetes mellitus with unspecified complications: Secondary | ICD-10-CM

## 2023-07-21 ENCOUNTER — Encounter: Payer: Self-pay | Admitting: Student in an Organized Health Care Education/Training Program

## 2023-07-21 ENCOUNTER — Ambulatory Visit (HOSPITAL_BASED_OUTPATIENT_CLINIC_OR_DEPARTMENT_OTHER): Admitting: Student in an Organized Health Care Education/Training Program

## 2023-07-21 ENCOUNTER — Ambulatory Visit
Admission: RE | Admit: 2023-07-21 | Discharge: 2023-07-21 | Disposition: A | Discharge status: Home / Self Care | Source: Ambulatory Visit | Attending: Student in an Organized Health Care Education/Training Program | Admitting: Student in an Organized Health Care Education/Training Program

## 2023-07-21 VITALS — BP 116/75 | HR 52 | Temp 97.1°F | Resp 16 | Ht 72.0 in | Wt 198.0 lb

## 2023-07-21 DIAGNOSIS — M47816 Spondylosis without myelopathy or radiculopathy, lumbar region: Secondary | ICD-10-CM | POA: Diagnosis not present

## 2023-07-21 DIAGNOSIS — G894 Chronic pain syndrome: Secondary | ICD-10-CM

## 2023-07-21 MED ORDER — DEXAMETHASONE SODIUM PHOSPHATE 10 MG/ML IJ SOLN
20.0000 mg | Freq: Once | INTRAMUSCULAR | Status: AC
  Administered 2023-07-21: 20 mg
  Filled 2023-07-21: qty 2

## 2023-07-21 MED ORDER — LACTATED RINGERS IV SOLN: Freq: Once | INTRAVENOUS | Status: AC

## 2023-07-21 MED ORDER — ROPIVACAINE HCL 2 MG/ML IJ SOLN
18.0000 mL | Freq: Once | INTRAMUSCULAR | Status: AC
  Administered 2023-07-21: 18 mL via PERINEURAL
  Filled 2023-07-21: qty 20

## 2023-07-21 MED ORDER — MIDAZOLAM HCL 2 MG/2ML IJ SOLN
0.5000 mg | Freq: Once | INTRAMUSCULAR | Status: AC
  Administered 2023-07-21: 2 mg via INTRAVENOUS
  Filled 2023-07-21: qty 2

## 2023-07-21 MED ORDER — LIDOCAINE HCL 2 % IJ SOLN
20.0000 mL | Freq: Once | INTRAMUSCULAR | Status: AC
  Administered 2023-07-21: 400 mg
  Filled 2023-07-21: qty 20

## 2023-07-21 MED ORDER — IOHEXOL 180 MG/ML  SOLN
10.0000 mL | Freq: Once | INTRAMUSCULAR | Status: DC
  Filled 2023-07-21: qty 20

## 2023-07-21 NOTE — Patient Instructions (Signed)

## 2023-07-21 NOTE — Progress Notes (Signed)
 Safety precautions to be maintained throughout the outpatient stay will include: orient to surroundings, keep bed in low position, maintain call bell within reach at all times, provide assistance with transfer out of bed and ambulation.   IV started at 0857; BP 100/68, pt states he feel fine,LR running wide open. Dr. Marcelino aware.  [id number]; BP 94/59, pt states he feels fine, I feel normal, fluids continue to run. Pt states BP was 109/64 at home this am.

## 2023-07-21 NOTE — Progress Notes (Signed)
 PROVIDER NOTE: Interpretation of information contained herein should be left to medically-trained personnel. Specific patient instructions are provided elsewhere under Patient Instructions section of medical record. This document was created in part using STT-dictation technology, any transcriptional errors that may result from this process are unintentional.  Patient: [name] [name] Type: Established DOB: [birth year] MRN: [id number] PCP: Dineen Rollene MATSU, [name]  Service: Procedure DOS: 07/21/2023 Setting: Ambulatory Location: Ambulatory outpatient facility Delivery: Face-to-face Provider: Wallie Sherry, MD Specialty: Interventional Pain Management Specialty designation: 09 Location: Outpatient facility Ref. Prov.: Dineen Rollene MATSU, [name]       Interventional Therapy   Type: Lumbar Facet, Medial Branch Block(s) (w/ fluoroscopic mapping) #2  Laterality: Bilateral  Level: L3, L4, and L5 Medial Branch Level(s). Injecting these levels blocks the L3-4 and L4-5 lumbar facet joints.  Imaging: Fluoroscopic guidance Spinal ([id number]) Anesthesia: Local anesthesia (1-2% Lidocaine ) Sedation:                         DOS: 07/21/2023 Performed by: Wallie Sherry, MD  Primary Purpose: Diagnostic/Therapeutic Indications: Low back pain severe enough to impact quality of life or function. 1. Lumbar spondylosis   2. Lumbar facet arthropathy   3. Chronic pain syndrome     NAS-11 Pain score:   Pre-procedure: 5 /10   Post-procedure: 0-No pain/10  Patient stopped Plavix  7 days prior taking 81 aspirin  instead    Position / Prep / Materials:  Position: Prone  Prep solution: ChloraPrep (2% chlorhexidine  gluconate and 70% isopropyl alcohol ) Area Prepped: Posterolateral Lumbosacral Spine (Wide prep: From the lower border of the scapula down to the end of the tailbone and from flank to flank.)  Materials:  Tray: Block Needle(s):  Type: Spinal  Gauge (G): 22  Length: 3.5-in Qty: 3     H&P (Pre-op  Assessment):  [name] [name] is a 83 y.o. (year old), male patient, seen today for interventional treatment. He  has a past surgical history that includes Cholecystectomy (2012); Tonsillectomy (1945); Lithotripsy (2015); Melanoma excision (1980); Nerve Surgery (2015); Coronary angioplasty with stent (1991 & 2005); Wisdom tooth extraction; Colonoscopy; Esophagogastroduodenoscopy (egd) with propofol  (N/A, 11/19/2016); Cystoscopy/ureteroscopy/holmium laser/stent placement (Right, 01/02/2018); Esophagogastroduodenoscopy (egd) with propofol  (N/A, 02/02/2019); LEFT HEART CATH AND CORONARY ANGIOGRAPHY (N/A, 07/07/2019); CORONARY PRESSURE/FFR STUDY (N/A, 07/07/2019); CORONARY ATHERECTOMY (N/A, 07/08/2019); Left Heart Cath (N/A, 07/08/2019); Coronary Ultrasound/IVUS (N/A, 07/08/2019); CORONARY STENT INTERVENTION (N/A, 07/08/2019); CORONARY BALLOON ANGIOPLASTY (N/A, 07/08/2019); ICD IMPLANT (N/A, 02/02/2020); LEAD REVISION/REPAIR (N/A, 02/16/2020); BIV UPGRADE (N/A, 02/26/2021); pre cancer lesion from scalp (09/2022); and RIGHT HEART CATH AND CORONARY ANGIOGRAPHY (Bilateral, 11/26/2022). [name] [name] has a current medication list which includes the following prescription(s): amiodarone , aspirin  ec, azelastine , bisacodyl, onetouch verio flex system, blood glucose monitoring suppl, carvedilol , cyclobenzaprine , dextromethorphan-guaifenesin, ezetimibe , famotidine, gabapentin , ginger (zingiber officinalis), glipizide , glucosamine, dry eye relief drops, onetouch ultrasoft, lisinopril , midodrine , multivitamin, NON FORMULARY, omega-3 fatty acids, onetouch verio, pantoprazole , psyllium, rosuvastatin , semaglutide  (1 mg/dose), sodium chloride , spironolactone , tamsulosin , tizanidine , tramadol , UNABLE TO FIND, and clopidogrel , and the following Facility-Administered Medications: iohexol  and lactated ringers . His primarily concern today is the Back Pain (lower)  Initial Vital Signs:  Pulse/HCG Rate: (!) 58ECG Heart Rate: (!) 54 Temp:  (!) 97.1 F (36.2 C) Resp: 16 BP: 100/68 (left arm) SpO2: 97 %  BMI: Estimated body mass index is 26.85 kg/m as calculated from the following:   Height as of this encounter: 6' (1.829 m).   Weight as of this encounter: 198 lb (89.8 kg).  Risk Assessment: Allergies: Reviewed. He is  allergic to metformin  and related, azithromycin, other, percocet [oxycodone -acetaminophen ], and jardiance  [empagliflozin ].  Allergy Precautions: None required Coagulopathies: Reviewed. None identified.  Blood-thinner therapy: None at this time Active Infection(s): Reviewed. None identified. [name] [name] is afebrile  Site Confirmation: [name] [name] was asked to confirm the procedure and laterality before marking the site Procedure checklist: Completed Consent: Before the procedure and under the influence of no sedative(s), amnesic(s), or anxiolytics, the patient was informed of the treatment options, risks and possible complications. To fulfill our ethical and legal obligations, as recommended by the American Medical Association's Code of Ethics, I have informed the patient of my clinical impression; the nature and purpose of the treatment or procedure; the risks, benefits, and possible complications of the intervention; the alternatives, including doing nothing; the risk(s) and benefit(s) of the alternative treatment(s) or procedure(s); and the risk(s) and benefit(s) of doing nothing. The patient was provided information about the general risks and possible complications associated with the procedure. These may include, but are not limited to: failure to achieve desired goals, infection, bleeding, organ or nerve damage, allergic reactions, paralysis, and death. In addition, the patient was informed of those risks and complications associated to Spine-related procedures, such as failure to decrease pain; infection (i.e.: Meningitis, epidural or intraspinal abscess); bleeding (i.e.: epidural hematoma, subarachnoid  hemorrhage, or any other type of intraspinal or peri-dural bleeding); organ or nerve damage (i.e.: Any type of peripheral nerve, nerve root, or spinal cord injury) with subsequent damage to sensory, motor, and/or autonomic systems, resulting in permanent pain, numbness, and/or weakness of one or several areas of the body; allergic reactions; (i.e.: anaphylactic reaction); and/or death. Furthermore, the patient was informed of those risks and complications associated with the medications. These include, but are not limited to: allergic reactions (i.e.: anaphylactic or anaphylactoid reaction(s)); adrenal axis suppression; blood sugar elevation that in diabetics may result in ketoacidosis or comma; water retention that in patients with history of congestive heart failure may result in shortness of breath, pulmonary edema, and decompensation with resultant heart failure; weight gain; swelling or edema; medication-induced neural toxicity; particulate matter embolism and blood vessel occlusion with resultant organ, and/or nervous system infarction; and/or aseptic necrosis of one or more joints. Finally, the patient was informed that Medicine is not an exact science; therefore, there is also the possibility of unforeseen or unpredictable risks and/or possible complications that may result in a catastrophic outcome. The patient indicated having understood very clearly. We have given the patient no guarantees and we have made no promises. Enough time was given to the patient to ask questions, all of which were answered to the patient's satisfaction. Mr. Peckham has indicated that he wanted to continue with the procedure. Attestation: I, the ordering provider, attest that I have discussed with the patient the benefits, risks, side-effects, alternatives, likelihood of achieving goals, and potential problems during recovery for the procedure that I have provided informed consent. Date  Time: 07/21/2023  8:39  AM  Pre-Procedure Preparation:  Monitoring: As per clinic protocol. Respiration, ETCO2, SpO2, BP, heart rate and rhythm monitor placed and checked for adequate function Safety Precautions: Patient was assessed for positional comfort and pressure points before starting the procedure. Time-out: I initiated and conducted the Time-out before starting the procedure, as per protocol. The patient was asked to participate by confirming the accuracy of the Time Out information. Verification of the correct person, site, and procedure were performed and confirmed by me, the nursing staff, and the patient. Time-out conducted as per Joint  Commission's Universal Protocol (UP.01.01.01). Time: 0957 Start Time: 0957 hrs.  Description of Procedure:          Laterality: (see above) Targeted Levels: (see above)  Safety Precautions: Aspiration looking for blood return was conducted prior to all injections. At no point did we inject any substances, as a needle was being advanced. Before injecting, the patient was told to immediately notify me if he was experiencing any new onset of ringing in the ears, or metallic taste in the mouth. No attempts were made at seeking any paresthesias. Safe injection practices and needle disposal techniques used. Medications properly checked for expiration dates. SDV (single dose vial) medications used. After the completion of the procedure, all disposable equipment used was discarded in the proper designated medical waste containers. Local Anesthesia: Protocol guidelines were followed. The patient was positioned over the fluoroscopy table. The area was prepped in the usual manner. The time-out was completed. The target area was identified using fluoroscopy. A 12-in long, straight, sterile hemostat was used with fluoroscopic guidance to locate the targets for each level blocked. Once located, the skin was marked with an approved surgical skin marker. Once all sites were marked, the  skin (epidermis, dermis, and hypodermis), as well as deeper tissues (fat, connective tissue and muscle) were infiltrated with a small amount of a short-acting local anesthetic, loaded on a 10cc syringe with a 25G, 1.5-in  Needle. An appropriate amount of time was allowed for local anesthetics to take effect before proceeding to the next step. Local Anesthetic: Lidocaine  2.0% The unused portion of the local anesthetic was discarded in the proper designated containers. Technical description of process:   L3 Medial Branch Nerve Block (MBB): The target area for the L3 medial branch is at the junction of the postero-lateral aspect of the superior articular process and the superior, posterior, and medial edge of the transverse process of L4. Under fluoroscopic guidance, a Quincke needle was inserted until contact was made with os over the superior postero-lateral aspect of the pedicular shadow (target area). After negative aspiration for blood, 2mL of the nerve block solution was injected without difficulty or complication. The needle was removed intact. L4 Medial Branch Nerve Block (MBB): The target area for the L4 medial branch is at the junction of the postero-lateral aspect of the superior articular process and the superior, posterior, and medial edge of the transverse process of L5. Under fluoroscopic guidance, a Quincke needle was inserted until contact was made with os over the superior postero-lateral aspect of the pedicular shadow (target area). After negative aspiration for blood, 2mL of the nerve block solution was injected without difficulty or complication. The needle was removed intact. L5 Medial Branch Nerve Block (MBB): The target area for the L5 medial branch is at the junction of the postero-lateral aspect of the superior articular process and the superior, posterior, and medial edge of the sacral ala. Under fluoroscopic guidance, a Quincke needle was inserted until contact was made with os over  the superior postero-lateral aspect of the pedicular shadow (target area). After negative aspiration for blood, 2 mL of the nerve block solution was injected without difficulty or complication. The needle was removed intact.   Once the entire procedure was completed, the treated area was cleaned, making sure to leave some of the prepping solution back to take advantage of its long term bactericidal properties.         Illustration of the posterior view of the lumbar spine and the posterior neural structures. Laminae  of L2 through S1 are labeled. DPRL5, dorsal primary ramus of L5; DPRS1, dorsal primary ramus of S1; DPR3, dorsal primary ramus of L3; FJ, facet (zygapophyseal) joint L3-L4; I, inferior articular process of L4; LB1, lateral branch of dorsal primary ramus of L1; IAB, inferior articular branches from L3 medial branch (supplies L4-L5 facet joint); IBP, intermediate branch plexus; MB3, medial branch of dorsal primary ramus of L3; NR3, third lumbar nerve root; S, superior articular process of L5; SAB, superior articular branches from L4 (supplies L4-5 facet joint also); TP3, transverse process of L3.   Facet Joint Innervation (* possible contribution)  L1-2 T12, L1 (L2*)  Medial Branch  L2-3 L1, L2 (L3*)                     L3-4 L2, L3 (L4*)                     L4-5 L3, L4 (L5*)                     L5-S1 L4, L5, S1                        Vitals:   07/21/23 0955 07/21/23 1000 07/21/23 1003 07/21/23 1008  BP: 128/80 120/81 116/68 116/75  Pulse:      Resp: 16 18 17 16   Temp:      SpO2: 97% 98% 97% 99%  Weight:      Height:         End Time: 1002 hrs.  Imaging Guidance (Spinal):          Type of Imaging Technique: Fluoroscopy Guidance (Spinal) Indication(s): Fluoroscopy guidance for needle placement to enhance accuracy in procedures requiring precise needle localization for targeted delivery of medication in or near specific anatomical locations not easily accessible  without such real-time imaging assistance. Exposure Time: Please see nurses notes. Contrast: None used. Fluoroscopic Guidance: I was personally present during the use of fluoroscopy. Tunnel Vision Technique used to obtain the best possible view of the target area. Parallax error corrected before commencing the procedure. Direction-depth-direction technique used to introduce the needle under continuous pulsed fluoroscopy. Once target was reached, antero-posterior, oblique, and lateral fluoroscopic projection used confirm needle placement in all planes. Images permanently stored in EMR. Interpretation: No contrast injected. I personally interpreted the imaging intraoperatively. Adequate needle placement confirmed in multiple planes. Permanent images saved into the patient's record.  Post-operative Assessment:  Post-procedure Vital Signs:  Pulse/HCG Rate: (!) 52(!) 54 Temp: (!) 97.1 F (36.2 C) Resp: 16 BP: 116/75 SpO2: 99 %  EBL: None  Complications: No immediate post-treatment complications observed by team, or reported by patient.  Note: The patient tolerated the entire procedure well. A repeat set of vitals were taken after the procedure and the patient was kept under observation following institutional policy, for this type of procedure. Post-procedural neurological assessment was performed, showing return to baseline, prior to discharge. The patient was provided with post-procedure discharge instructions, including a section on how to identify potential problems. Should any problems arise concerning this procedure, the patient was given instructions to immediately contact us , at any time, without hesitation. In any case, we plan to contact the patient by telephone for a follow-up status report regarding this interventional procedure.  Comments:  No additional relevant information.  Plan of Care (POC)  Okay to restart Plavix  tomorrow.  Orders:  Orders Placed This Encounter   Procedures  DG PAIN [facility] C-ARM 1-60 MIN NO REPORT    Intraoperative interpretation by procedural physician at [facility].    Standing Status:   Standing    Number of Occurrences:   1    Reason for exam::   Assistance in needle guidance and placement for procedures requiring needle placement in or near specific anatomical locations not easily accessible without such assistance.    Medications ordered for procedure: Meds ordered this encounter  Medications   iohexol  (OMNIPAQUE ) 180 MG/ML injection 10 mL    Must be Myelogram-compatible. If not available, you may substitute with a water-soluble, non-ionic, hypoallergenic, myelogram-compatible radiological contrast medium.   lidocaine  (XYLOCAINE ) 2 % (with pres) injection 400 mg   lactated ringers  infusion   midazolam  (VERSED ) injection 0.5-2 mg    Make sure Flumazenil is available in the pyxis when using this medication. If oversedation occurs, administer 0.2 mg IV over 15 sec. If after 45 sec no response, administer 0.2 mg again over 1 min; may repeat at 1 min intervals; not to exceed 4 doses (1 mg)   ropivacaine  (PF) 2 mg/mL (0.2%) (NAROPIN ) injection 18 mL   dexamethasone  (DECADRON ) injection 20 mg   Medications administered: We administered lidocaine , lactated ringers , midazolam , ropivacaine  (PF) 2 mg/mL (0.2%), and dexamethasone .  See the medical record for exact dosing, route, and time of administration.  Follow-up plan:   Return in about 4 weeks (around 08/18/2023) for PPE, F2F.       BLF L3-5 05/26/23, 07/21/23    Recent Visits Date Type Provider Dept  06/17/23 Office Visit Marcelino Nurse, MD [facility]  05/26/23 Procedure visit Marcelino Nurse, MD [facility]  05/01/23 Office Visit Marcelino Nurse, MD [facility]  Showing recent visits within past 90 days and meeting all other requirements Today's Visits Date Type Provider Dept  07/21/23 Procedure visit Marcelino Nurse, MD [facility]  Showing today's visits and meeting all other requirements Future Appointments Date Type Provider Dept  08/19/23 Appointment Marcelino Nurse, MD [facility]  Showing future appointments within next 90 days and meeting all other requirements  Disposition: Discharge home  Discharge (Date  Time): 07/21/2023; 1011 hrs.   Primary Care Physician: Dineen Rollene MATSU, [name] Location: [facility] Note by: Nurse Marcelino, MD (TTS technology used. I apologize for any typographical errors that were not detected and corrected.) Date: 07/21/2023; Time: 10:21 AM  Disclaimer:  Medicine is not an Visual merchandiser. The only guarantee in medicine is that nothing is guaranteed. It is important to note that the decision to proceed with this intervention was based on the information collected from the patient. The Data and conclusions were drawn from the patient's questionnaire, the interview, and the physical examination. Because the information was provided in large part by the patient, it cannot be guaranteed that it has not been purposely or unconsciously manipulated. Every effort has been made to obtain as much relevant data as possible for this evaluation. It is important to note that the conclusions that lead to this procedure are derived in large part from the available data. Always take into account that the treatment will also be dependent on availability of resources and existing treatment guidelines, considered by other Pain Management Practitioners as being common knowledge and practice, at the time of the intervention. For Medico-Legal purposes, it is also important to point out that variation in procedural techniques and pharmacological choices are the acceptable norm. The indications, contraindications, technique, and results  of the above procedure should only be interpreted and judged by a Board-Certified Interventional Pain Specialist with extensive familiarity and  expertise in the same exact procedure and technique.

## 2023-07-22 ENCOUNTER — Telehealth: Payer: Self-pay

## 2023-07-22 NOTE — Telephone Encounter (Signed)
 Copied from CRM [id number]. Topic: Clinical - Medical Advice >> Jul 22, 2023 11:49 AM Thersia BROCKS wrote: Reason for CRM: Patient called in stated its been a change in insurance, what they are providing and not , no longer proving coverage one touch meter, changing to a different brand which is contour plus blue meter and test strips and needles effective August 3rd

## 2023-07-22 NOTE — Telephone Encounter (Signed)
 No issues post-procedure.

## 2023-07-23 MED ORDER — GLUCOSE BLOOD VI STRP: ORAL_STRIP | 12 refills | Status: DC

## 2023-07-23 NOTE — Telephone Encounter (Signed)
 Please send in countour meter plus supplies

## 2023-07-23 NOTE — Addendum Note (Signed)
 Addended by: Alzada Brazee on: 07/23/2023 10:55 AM   Modules accepted: Orders

## 2023-07-26 ENCOUNTER — Other Ambulatory Visit: Payer: Self-pay | Admitting: Family

## 2023-07-26 DIAGNOSIS — G8929 Other chronic pain: Secondary | ICD-10-CM

## 2023-07-28 ENCOUNTER — Ambulatory Visit: Admitting: Family

## 2023-07-29 NOTE — Telephone Encounter (Signed)
 Called and spoke to pt and he stated that usually he takes 1 time a day if will take more if needed. Pt has an appointment 09/08/2023.  Last RF 04/30/2022 # 90 3 RF

## 2023-07-31 ENCOUNTER — Encounter: Payer: Self-pay | Admitting: Family

## 2023-08-19 ENCOUNTER — Encounter: Payer: Self-pay | Admitting: Student in an Organized Health Care Education/Training Program

## 2023-08-19 ENCOUNTER — Ambulatory Visit
Attending: Student in an Organized Health Care Education/Training Program | Admitting: Student in an Organized Health Care Education/Training Program

## 2023-08-19 VITALS — BP 92/65 | HR 51 | Temp 97.3°F | Resp 16 | Ht 72.0 in | Wt 198.0 lb

## 2023-08-19 DIAGNOSIS — G894 Chronic pain syndrome: Secondary | ICD-10-CM | POA: Diagnosis not present

## 2023-08-19 DIAGNOSIS — M47816 Spondylosis without myelopathy or radiculopathy, lumbar region: Secondary | ICD-10-CM | POA: Insufficient documentation

## 2023-08-19 NOTE — Progress Notes (Signed)
 PROVIDER NOTE: Interpretation of information contained herein should be left to medically-trained personnel. Specific patient instructions are provided elsewhere under Patient Instructions section of medical record. This document was created in part using AI and STT-dictation technology, any transcriptional errors that may result from this process are unintentional.  Patient: [name] [name]  Service: E/M   PCP: Dineen Rollene MATSU, [name]  DOB: 1940/11/11  DOS: 08/19/2023  Provider: Wallie Sherry, MD  MRN: [id number]  Delivery: Face-to-face  Specialty: Interventional Pain Management  Type: Established Patient  Setting: Ambulatory outpatient facility  Specialty designation: 09  Referring Prov.: Dineen Rollene MATSU, [name]  Location: Outpatient office facility       History of present illness (HPI) [name] [name], a 83 y.o. year old male, is here today because of his Lumbar spondylosis [M47.816]. [name] [name] primary complain today is Back Pain  Pain Assessment: Severity of Chronic pain is reported as a 3 /10. Location: Back Lower/denies. Onset: More than a month ago. Quality: Spasm. Timing: Other (Comment). Modifying factor(s): lying or sitting. Vitals:  height is 6' (1.829 m) and weight is 198 lb (89.8 kg). His temperature is 97.3 F (36.3 C) (abnormal). His blood pressure is 92/65 and his pulse is 51 (abnormal). His respiration is 16 and oxygen saturation is 95%.  BMI: Estimated body mass index is 26.85 kg/m as calculated from the following:   Height as of this encounter: 6' (1.829 m).   Weight as of this encounter: 198 lb (89.8 kg).  Last encounter: 06/17/2023. Last procedure: 07/21/2023.  Reason for encounter: PPE  Post-Procedure Evaluation   Type: Lumbar Facet, Medial Branch Block(s) (w/ fluoroscopic mapping) #2  Laterality: Bilateral  Level: L3, L4, and L5 Medial Branch Level(s). Injecting these levels blocks the L3-4 and L4-5 lumbar facet joints.  Imaging: Fluoroscopic guidance Spinal  ([id number]) Anesthesia: Local anesthesia (1-2% Lidocaine ) Sedation:                         DOS: 07/21/2023 Performed by: Wallie Sherry, MD  Primary Purpose: Diagnostic/Therapeutic Indications: Low back pain severe enough to impact quality of life or function. 1. Lumbar spondylosis   2. Lumbar facet arthropathy   3. Chronic pain syndrome     NAS-11 Pain score:   Pre-procedure: 5 /10   Post-procedure: 0-No pain/10  Patient stopped Plavix  7 days prior taking 81 aspirin  instead    Effectiveness:  Initial hour after procedure: 100% Subsequent 4-6 hours post-procedure: 100% Analgesia past initial 6 hours: 80% Ongoing improvement:  Analgesic:  80% for 10 days and then pain to gradually return Function: Somewhat improved ROM: Back to baseline   Dayna Pulling, RN  08/19/2023 11:20 AM  Sign when Signing Visit Safety precautions to be maintained throughout the outpatient stay will include: orient to surroundings, keep bed in low position, maintain call bell within reach at all times, provide assistance with transfer out of bed and ambulation.     UDS:  Summary  Date Value Ref Range Status  05/01/2023 FINAL  Final    Comment:    ==================================================================== Compliance Drug Analysis, Ur ==================================================================== Specimen Alert Not Detected result may be consistent with the time of last use noted for this medication. AS NEEDED (Guaifenesin) ==================================================================== Test                             Result       Flag       Units  Drug Present and Declared for Prescription Verification   Tramadol                        >6579        EXPECTED   ng/mg creat   O-Desmethyltramadol            >6579        EXPECTED   ng/mg creat   N-Desmethyltramadol            772          EXPECTED   ng/mg creat    Source of tramadol  is a prescription medication.  O-desmethyltramadol    and N-desmethyltramadol are expected metabolites of tramadol .    Cyclobenzaprine                 PRESENT      EXPECTED   Desmethylcyclobenzaprine       PRESENT      EXPECTED    Desmethylcyclobenzaprine is an expected metabolite of    cyclobenzaprine .  Drug Present not Declared for Prescription Verification   Acetaminophen                   PRESENT      UNEXPECTED  Drug Absent but Declared for Prescription Verification   Gabapentin                      Not Detected UNEXPECTED   Tizanidine                      Not Detected UNEXPECTED    Tizanidine , as indicated in the declared medication list, is not    always detected even when used as directed.    Salicylate                     Not Detected UNEXPECTED    Aspirin , as indicated in the declared medication list, is not always    detected even when used as directed.    Dextromethorphan               Not Detected UNEXPECTED   Guaifenesin                    Not Detected UNEXPECTED ==================================================================== Test                      Result    Flag   Units      Ref Range   Creatinine              76               mg/dL      >=79 ==================================================================== Declared Medications:  The flagging and interpretation on this report are based on the  following declared medications.  Unexpected results may arise from  inaccuracies in the declared medications.   **Note: The testing scope of this panel includes these medications:   Cyclobenzaprine  (Flexeril )  Dextromethorphan  Gabapentin  (Neurontin )  Guaifenesin (Mucinex)  Tramadol  (Ultram )   **Note: The testing scope of this panel does not include small to  moderate amounts of these reported medications:   Aspirin   Tizanidine  (Zanaflex )   **Note: The testing scope of this panel does not include the  following reported medications:   Amiodarone  (Pacerone )  Azelastine  (Astelin )   Bisacodyl  Carvedilol  (Coreg )  Clopidogrel  (Plavix )  Eye Drops  Ezetimibe  (Zetia )  Famotidine (Pepcid)  Glipizide  (Glucotrol )  Glucosamine  Lisinopril  (Zestril )  Midodrine  (Proamatine )  Multivitamin  Omega-3 Fatty Acids  Pantoprazole  (Protonix )  Psyllium  Rosuvastatin  (Crestor )  Semaglutide  (Ozempic )  Sodium Chloride   Spironolactone  (Aldactone )  Supplement (Ginger)  Tamsulosin  (Flomax ) ==================================================================== For clinical consultation, please call [phone]. ====================================================================     No results found for: CBDTHCR No results found for: D8THCCBX No results found for: D9THCCBX  ROS  Constitutional: Denies any fever or chills Gastrointestinal: No reported hemesis, hematochezia, vomiting, or acute GI distress Musculoskeletal: Axial low back pain Neurological: No reported episodes of acute onset apraxia, aphasia, dysarthria, agnosia, amnesia, paralysis, loss of coordination, or loss of consciousness  Medication Review  Blood Glucose Monitoring Suppl, Ginger (Zingiber officinalis), Glucosamine, Glycerin-Hypromellose-PEG 400, NON FORMULARY, Omega-3 Fatty Acids, OneTouch Verio Flex System, Psyllium, Semaglutide  (1 MG/DOSE), UNABLE TO FIND, amiodarone , aspirin  EC, azelastine , bisacodyl, carvedilol , clopidogrel , cyclobenzaprine , dextromethorphan-guaiFENesin, ezetimibe , famotidine, gabapentin , glipiZIDE , glucose blood, lisinopril , midodrine , multivitamin, onetouch ultrasoft, pantoprazole , rosuvastatin , sodium chloride , spironolactone , tamsulosin , tizanidine , and traMADol   History Review  Allergy: [name] [name] is allergic to metformin  and related, azithromycin, other, percocet [oxycodone -acetaminophen ], and jardiance  [empagliflozin ]. Drug: [name] [name]  reports no history of drug use. Alcohol :  reports current alcohol  use. Tobacco:  reports that he has been smoking pipe. He has been  exposed to tobacco smoke. He quit smokeless tobacco use about 7 years ago. Social: [name] [name]  reports that he has been smoking pipe. He has been exposed to tobacco smoke. He quit smokeless tobacco use about 7 years ago. He reports current alcohol  use. He reports that he does not use drugs. Medical:  has a past medical history of Aortic insufficiency, Arthritis, CAD (coronary artery disease), Chicken pox, Colon polyps, Diverticulitis, DM type 2 (diabetes mellitus, type 2) (HCC), Family history of adverse reaction to anesthesia, GERD (gastroesophageal reflux disease), Heart murmur, History of kidney stones, HOH (hard of hearing), Hyperlipidemia, Hypertension, Ischemic cardiomyopathy, Kidney stones, Melanoma (HCC) (1980), Mitral regurgitation, and Myocardial infarction (HCC). Surgical: [name] [name]  has a past surgical history that includes Cholecystectomy (2012); Tonsillectomy (1945); Lithotripsy (2015); Melanoma excision (1980); Nerve Surgery (2015); Coronary angioplasty with stent (1991 & 2005); Wisdom tooth extraction; Colonoscopy; Esophagogastroduodenoscopy (egd) with propofol  (N/A, 11/19/2016); Cystoscopy/ureteroscopy/holmium laser/stent placement (Right, 01/02/2018); Esophagogastroduodenoscopy (egd) with propofol  (N/A, 02/02/2019); LEFT HEART CATH AND CORONARY ANGIOGRAPHY (N/A, 07/07/2019); CORONARY PRESSURE/FFR STUDY (N/A, 07/07/2019); CORONARY ATHERECTOMY (N/A, 07/08/2019); Left Heart Cath (N/A, 07/08/2019); Coronary Ultrasound/IVUS (N/A, 07/08/2019); CORONARY STENT INTERVENTION (N/A, 07/08/2019); CORONARY BALLOON ANGIOPLASTY (N/A, 07/08/2019); ICD IMPLANT (N/A, 02/02/2020); LEAD REVISION/REPAIR (N/A, 02/16/2020); BIV UPGRADE (N/A, 02/26/2021); pre cancer lesion from scalp (09/2022); and RIGHT HEART CATH AND CORONARY ANGIOGRAPHY (Bilateral, 11/26/2022). Family: family history includes Colon cancer in his father; Diabetes in his father and mother; Heart attack in his mother; Heart disease in his mother;  Hyperlipidemia in his mother; Hypertension in his mother; Kidney cancer in his father; Liver cancer in his father; Lung cancer in his father.  Laboratory Chemistry Profile   Renal Lab Results  Component Value Date   BUN 22 11/22/2022   CREATININE 1.04 11/22/2022   BCR 21 11/22/2022   GFR 73.48 10/23/2022   GFRAA >60 07/09/2019   GFRNONAA >60 02/09/2022    Hepatic Lab Results  Component Value Date   AST 46 (H) 10/23/2022   ALT 37 10/23/2022   ALBUMIN 3.9 10/23/2022   ALKPHOS 76 10/23/2022   HCVAB NON REACTIVE 03/25/2019   LIPASE 55.0 09/09/2016    Electrolytes Lab Results  Component Value Date   NA 139 11/26/2022   K 4.0 11/26/2022  CL 105 11/22/2022   CALCIUM  9.2 11/22/2022   MG 1.4 (L) 05/20/2020    Bone Lab Results  Component Value Date   VD25OH 33.0 01/08/2017    Inflammation (CRP: Acute Phase) (ESR: Chronic Phase) Lab Results  Component Value Date   CRP <0.8 01/08/2017   ESRSEDRATE 10 01/08/2017         Note: Above Lab results reviewed.  Recent Imaging Review  CT LUMBAR SPINE WO CONTRAST   Narrative CLINICAL DATA:  Provided history: Lumbar spondylosis. Lumbar radiculopathy. Spondylolisthesis of lumbar region. Additional history provided: Low back pain with right lateral thigh pain.   EXAM: CT LUMBAR SPINE WITHOUT CONTRAST   TECHNIQUE: Multidetector CT imaging of the lumbar spine was performed without intravenous contrast administration. Multiplanar CT image reconstructions were also generated.   RADIATION DOSE REDUCTION: This exam was performed according to the departmental dose-optimization program which includes automated exposure control, adjustment of the mA and/or kV according to patient size and/or use of iterative reconstruction technique.   COMPARISON:  Lumbar spine radiographs 02/12/2022. CT abdomen/pelvis 03/16/2021.   FINDINGS: Segmentation: 5 lumbar vertebrae. The caudal most well-formed intervertebral disc space is designated  L5-S1.   Alignment: Slight grade 1 retrolisthesis at L1-L2 and L2-L3. 7 mm L4-L5 grade 1 anterolisthesis. Slight grade 1 retrolisthesis at L5-S1.   Vertebrae: Vertebral body height is maintained. No lumbar spine fracture.   Paraspinal and other soft tissues: Bilateral nephrolithiasis. Known bilateral fluid-filled renal lesions, some with associated rim calcification, incompletely imaged and incompletely assessed on the current exam. Aortoiliac atherosclerosis. No paraspinal mass or collection.   Disc levels:   Multilevel disc space narrowing, greatest at L4-L5 and L5-S1 (mild-to-moderate at these levels). L5-S1 disc vacuum phenomenon.   T12-L1: No significant disc herniation or stenosis.   L1-L2: Partially calcified disc bulge. Mild effacement of the ventral thecal sac. No significant foraminal stenosis.   L2-L3: Disc bulge. Mild facet arthrosis. No significant spinal canal stenosis. Mild relative right neural foraminal narrowing.   L3-L4: Disc bulge with mild endplate spurring. Ligamentum flavum hypertrophy. Mild bilateral subarticular narrowing. No significant central canal stenosis. Mild-to-moderate bilateral neural foraminal narrowing.   L4-L5: 7 mm grade 1 anterolisthesis. Disc uncovering with disc bulge. Advanced facet arthrosis. Bilateral subarticular narrowing. Mild relative narrowing of the central canal. Moderate bilateral neural foraminal narrowing with potential to affect either exiting L4 nerve root.   L5-S1: Disc bulge. Mild facet arthrosis. No significant spinal canal stenosis. Mild relative left neural foraminal narrowing.   Other: Osseous fusion across the anterior aspect of the right sacroiliac joint.   IMPRESSION: 1. Lumbar spondylosis, as outlined and with findings most notably as follows. 2. At L4-L5, there is 7 mm grade 1 anterolisthesis. Mild-to-moderate disc degeneration. Disc uncovering with disc bulge. Advanced facet arthrosis. Bilateral  subarticular narrowing. Mild relative narrowing of the central canal. Moderate bilateral neural foraminal narrowing with potential to affect either exiting L4 nerve root. 3. No more than mild spinal canal narrowing at the remaining lumbar levels. Additional sites of foraminal stenosis, as described and greatest bilaterally at L3-L4 (mild-to-moderate at this level). 4. Osseous fusion across the anterior aspect of the right sacroiliac joint. 5. Bilateral nephrolithiasis. 6. Known bilateral fluid-filled renal lesions, some with associated rim calcification, incompletely imaged and incompletely assessed on the current exam. Please refer to the prior CT abdomen/pelvis of 03/16/2021 for further description and for follow-up recommendations. 7.  Aortic Atherosclerosis (ICD10-I70.0). Note: Reviewed        Physical Exam  Vitals: BP 92/65  Pulse (!) 51   Temp (!) 97.3 F (36.3 C)   Resp 16   Ht 6' (1.829 m)   Wt 198 lb (89.8 kg)   SpO2 95%   BMI 26.85 kg/m  BMI: Estimated body mass index is 26.85 kg/m as calculated from the following:   Height as of this encounter: 6' (1.829 m).   Weight as of this encounter: 198 lb (89.8 kg). Ideal: Ideal body weight: 77.6 kg (171 lb 1.2 oz) Adjusted ideal body weight: 82.5 kg (181 lb 13.5 oz) General appearance: Well nourished, well developed, and well hydrated. In no apparent acute distress Mental status: Alert, oriented x 3 (person, place, & time)       Respiratory: No evidence of acute respiratory distress Eyes: PERLA Lumbar Spine Area Exam  Skin & Axial Inspection: No masses, redness, or swelling Alignment: Symmetrical Functional ROM: Pain restricted ROM       Stability: No instability detected Muscle Tone/Strength: Functionally intact. No obvious neuro-muscular anomalies detected. Sensory (Neurological): Musculoskeletal pain pattern Palpation: No palpable anomalies       Provocative Tests: Hyperextension/rotation test: (+) bilaterally for  facet joint pain. Lumbar quadrant test (Kemp's test): (+) bilaterally for facet joint pain.   Gait & Posture Assessment  Ambulation: Unassisted Gait: Relatively normal for age and body habitus Posture: WNL  Lower Extremity Exam      Side: Right lower extremity   Side: Left lower extremity  Stability: No instability observed           Stability: No instability observed          Skin & Extremity Inspection: Skin color, temperature, and hair growth are WNL. No peripheral edema or cyanosis. No masses, redness, swelling, asymmetry, or associated skin lesions. No contractures.   Skin & Extremity Inspection: Skin color, temperature, and hair growth are WNL. No peripheral edema or cyanosis. No masses, redness, swelling, asymmetry, or associated skin lesions. No contractures.  Functional ROM: Unrestricted ROM                   Functional ROM: Unrestricted ROM                  Muscle Tone/Strength: Functionally intact. No obvious neuro-muscular anomalies detected.   Muscle Tone/Strength: Functionally intact. No obvious neuro-muscular anomalies detected.  Sensory (Neurological): Unimpaired         Sensory (Neurological): Unimpaired        DTR: Patellar: deferred today Achilles: deferred today Plantar: deferred today   DTR: Patellar: deferred today Achilles: deferred today Plantar: deferred today  Palpation: No palpable anomalies   Palpation: No palpable anomalies    Assessment   Diagnosis Status  1. Lumbar spondylosis   2. Lumbar facet arthropathy   3. Chronic pain syndrome    Controlled Controlled Controlled   Updated Problems: No problems updated.  Plan of Care  The patient has undergone two positive diagnostic lumbar facet and medial branch nerve blocks on 05/26/2023, 07/21/2023, each providing >=80% pain relief for the expected duration of action. Given the reproducible and significant pain relief with these targeted blocks, we recommend proceeding with lumbar radiofrequency ablation  (RFA) of the medial branch nerves to achieve longer-term pain reduction and functional improvement. The patient was counseled on the procedure, expected outcomes, and potential risks, and agrees with the plan.   Orders:  Orders Placed This Encounter  Procedures   Radiofrequency,Lumbar    Standing Status:   Future    Expected Date:   08/27/2023  Expiration Date:   08/18/2024    Scheduling Instructions:     Side(s): Bilateral     Level(s): L3, L4, L5  Medial Branch Nerve(s)     Sedation: With Sedation     Scheduling Timeframe: As soon as pre-approved    Where will this procedure be performed?:   [facility]     BLF L3-5 05/26/23, 07/21/23    Return in about 15 days (around 09/03/2023) for B/L L3, 4, 5 RFA, in clinic IV Versed  (stop Plavix  7 days prior, ok to take ASA 81 mg).    Recent Visits Date Type Provider Dept  07/21/23 Procedure visit Marcelino Nurse, MD [facility]  06/17/23 Office Visit Marcelino Nurse, MD [facility]  05/26/23 Procedure visit Marcelino Nurse, MD [facility]  Showing recent visits within past 90 days and meeting all other requirements Today's Visits Date Type Provider Dept  08/19/23 Office Visit Marcelino Nurse, MD [facility]  Showing today's visits and meeting all other requirements Future Appointments Date Type Provider Dept  09/03/23 Appointment Marcelino Nurse, MD [facility]  Showing future appointments within next 90 days and meeting all other requirements  I discussed the assessment and treatment plan with the patient. The patient was provided an opportunity to ask questions and all were answered. The patient agreed with the plan and demonstrated an understanding of the instructions.  Patient advised to call back or seek an in-person evaluation if the symptoms or condition worsens.  Duration of encounter: .  Total time on encounter, as per AMA guidelines included both the face-to-face and  non-face-to-face time personally spent by the physician and/or other qualified health care professional(s) on the day of the encounter (includes time in activities that require the physician or other qualified health care professional and does not include time in activities normally performed by clinical staff). Physician's time may include the following activities when performed: Preparing to see the patient (e.g., pre-charting review of records, searching for previously ordered imaging, lab work, and nerve conduction tests) Review of prior analgesic pharmacotherapies. Reviewing PMP Interpreting ordered tests (e.g., lab work, imaging, nerve conduction tests) Performing post-procedure evaluations, including interpretation of diagnostic procedures Obtaining and/or reviewing separately obtained history Performing a medically appropriate examination and/or evaluation Counseling and educating the patient/family/caregiver Ordering medications, tests, or procedures Referring and communicating with other health care professionals (when not separately reported) Documenting clinical information in the electronic or other health record Independently interpreting results (not separately reported) and communicating results to the patient/ family/caregiver Care coordination (not separately reported)  Note by: Nurse Marcelino, MD (TTS and AI technology used. I apologize for any typographical errors that were not detected and corrected.) Date: 08/19/2023; Time: 1:22 PM

## 2023-08-19 NOTE — Progress Notes (Signed)
 Safety precautions to be maintained throughout the outpatient stay will include: orient to surroundings, keep bed in low position, maintain call bell within reach at all times, provide assistance with transfer out of bed and ambulation.

## 2023-08-19 NOTE — Patient Instructions (Addendum)
 ______________________________________________________________________    General Risks and Possible Complications  Patient Responsibilities: It is important that you read this as it is part of your informed consent. It is our duty to inform you of the risks and possible complications associated with treatments offered to you. It is your responsibility as a patient to read this and to ask questions about anything that is not clear or that you believe was not covered in this document.  Patient's Rights: You have the right to refuse treatment. You also have the right to change your mind, even after initially having agreed to have the treatment done. However, under this last option, if you wait until the last second to change your mind, you may be charged for the materials used up to that point.  Introduction: Medicine is not an Visual merchandiser. Everything in Medicine, including the lack of treatment(s), carries the potential for danger, harm, or loss (which is by definition: Risk). In Medicine, a complication is a secondary problem, condition, or disease that can aggravate an already existing one. All treatments carry the risk of possible complications. The fact that a side effects or complications occurs, does not imply that the treatment was conducted incorrectly. It must be clearly understood that these can happen even when everything is done following the highest safety standards.  No treatment: You can choose not to proceed with the proposed treatment alternative. The "PRO(s)" would include: avoiding the risk of complications associated with the therapy. The "CON(s)" would include: not getting any of the treatment benefits. These benefits fall under one of three categories: diagnostic; therapeutic; and/or palliative. Diagnostic benefits include: getting information which can ultimately lead to improvement of the disease or symptom(s). Therapeutic benefits are those associated with the successful  treatment of the disease. Finally, palliative benefits are those related to the decrease of the primary symptoms, without necessarily curing the condition (example: decreasing the pain from a flare-up of a chronic condition, such as incurable terminal cancer).  General Risks and Complications: These are associated to most interventional treatments. They can occur alone, or in combination. They fall under one of the following six (6) categories: no benefit or worsening of symptoms; bleeding; infection; nerve damage; allergic reactions; and/or death. No benefits or worsening of symptoms: In Medicine there are no guarantees, only probabilities. No healthcare provider can ever guarantee that a medical treatment will work, they can only state the probability that it may. Furthermore, there is always the possibility that the condition may worsen, either directly, or indirectly, as a consequence of the treatment. Bleeding: This is more common if the patient is taking a blood thinner, either prescription or over the counter (example: Goody Powders, Fish oil, Aspirin , Garlic, etc.), or if suffering a condition associated with impaired coagulation (example: Hemophilia, cirrhosis of the liver, low platelet counts, etc.). However, even if you do not have one on these, it can still happen. If you have any of these conditions, or take one of these drugs, make sure to notify your treating physician. Infection: This is more common in patients with a compromised immune system, either due to disease (example: diabetes, cancer, human immunodeficiency virus [HIV], etc.), or due to medications or treatments (example: therapies used to treat cancer and rheumatological diseases). However, even if you do not have one on these, it can still happen. If you have any of these conditions, or take one of these drugs, make sure to notify your treating physician. Nerve Damage: This is more common when the treatment is  an invasive one, but it  can also happen with the use of medications, such as those used in the treatment of cancer. The damage can occur to small secondary nerves, or to large primary ones, such as those in the spinal cord and brain. This damage may be temporary or permanent and it may lead to impairments that can range from temporary numbness to permanent paralysis and/or brain death. Allergic Reactions: Any time a substance or material comes in contact with our body, there is the possibility of an allergic reaction. These can range from a mild skin rash (contact dermatitis) to a severe systemic reaction (anaphylactic reaction), which can result in death. Death: In general, any medical intervention can result in death, most of the time due to an unforeseen complication. ______________________________________________________________________      ______________________________________________________________________    Preparing for your procedure  Appointments: If you think you may not be able to keep your appointment, call 24-48 hours in advance to cancel. We need time to make it available to others.  Procedure visits are for procedures only. During your procedure appointment there will be: NO Prescription Refills*. NO medication changes or discussions*. NO discussion of disability issues*. NO unrelated pain problem evaluations*. NO evaluations to order other pain procedures*. *These will be addressed at a separate and distinct evaluation encounter on the provider's evaluation schedule and not during procedure days.  Instructions: Food intake: Avoid eating anything solid for at least 8 hours prior to your procedure. Clear liquid intake: You may take clear liquids such as water up to 2 hours prior to your procedure. (No carbonated drinks. No soda.) Transportation: Unless otherwise stated by your physician, bring a driver. (Driver cannot be a Market researcher, Pharmacist, community, or any other form of public transportation.) Morning  Medicines: Except for blood thinners, take all of your other morning medications with a sip of water. Make sure to take your heart and blood pressure medicines. If your blood pressure's lower number is above 100, the case will be rescheduled. Blood thinners: Make sure to stop your blood thinners as instructed.  If you take a blood thinner, but were not instructed to stop it, call our office [phone] and ask to talk to a nurse. Not stopping a blood thinner prior to certain procedures could lead to serious complications. Diabetics on insulin : Notify the staff so that you can be scheduled 1st case in the morning. If your diabetes requires high dose insulin , take only  of your normal insulin  dose the morning of the procedure and notify the staff that you have done so. Preventing infections: Shower with an antibacterial soap the morning of your procedure.  Build-up your immune system: Take 1000 mg of Vitamin C with every meal (3 times a day) the day prior to your procedure. Antibiotics: Inform the nursing staff if you are taking any antibiotics or if you have any conditions that may require antibiotics prior to procedures. (Example: recent joint implants)   Pregnancy: If you are pregnant make sure to notify the nursing staff. Not doing so may result in injury to the fetus, including death.  Sickness: If you have a cold, fever, or any active infections, call and cancel or reschedule your procedure. Receiving steroids while having an infection may result in complications. Arrival: You must be in the facility at least 30 minutes prior to your scheduled procedure. Tardiness: Your scheduled time is also the cutoff time. If you do not arrive at least 15 minutes prior to your procedure, you will  be rescheduled.  Children: Do not bring any children with you. Make arrangements to keep them home. Dress appropriately: There is always a possibility that your clothing may get soiled. Avoid long dresses. Valuables:  Do not bring any jewelry or valuables.  Reasons to call and reschedule or cancel your procedure: (Following these recommendations will minimize the risk of a serious complication.) Surgeries: Avoid having procedures within 2 weeks of any surgery. (Avoid for 2 weeks before or after any surgery). Flu Shots: Avoid having procedures within 2 weeks of a flu shots or . (Avoid for 2 weeks before or after immunizations). Barium: Avoid having a procedure within 7-10 days after having had a radiological study involving the use of radiological contrast. (Myelograms, Barium swallow or enema study). Heart attacks: Avoid any elective procedures or surgeries for the initial 6 months after a Myocardial Infarction (Heart Attack). Blood thinners: It is imperative that you stop these medications before procedures. Let us  know if you if you take any blood thinner.   STOP Plavix  x 7 days and continue ASA 81 mg  Infection: Avoid procedures during or within two weeks of an infection (including chest colds or gastrointestinal problems). Symptoms associated with infections include: Localized redness, fever, chills, night sweats or profuse sweating, burning sensation when voiding, cough, congestion, stuffiness, runny nose, sore throat, diarrhea, nausea, vomiting, cold or Flu symptoms, recent or current infections. It is specially important if the infection is over the area that we intend to treat. Heart and lung problems: Symptoms that may suggest an active cardiopulmonary problem include: cough, chest pain, breathing difficulties or shortness of breath, dizziness, ankle swelling, uncontrolled high or unusually low blood pressure, and/or palpitations. If you are experiencing any of these symptoms, cancel your procedure and contact your primary care physician for an evaluation.  Remember:  Regular Business hours are:  Monday to Thursday 8:00 AM to 4:00 PM  Provider's Schedule: Eric Como, MD:  Procedure days: Tuesday  and Thursday 7:30 AM to 4:00 PM  Wallie Sherry, MD:  Procedure days: Monday and Wednesday 7:30 AM to 4:00 PM Last  Updated: 12/24/2022 ______________________________________________________________________

## 2023-08-20 ENCOUNTER — Other Ambulatory Visit: Payer: Self-pay | Admitting: Cardiovascular Disease

## 2023-08-22 ENCOUNTER — Other Ambulatory Visit: Payer: Self-pay | Admitting: Cardiology

## 2023-08-26 ENCOUNTER — Ambulatory Visit: Payer: Medicare Other

## 2023-08-26 DIAGNOSIS — I442 Atrioventricular block, complete: Secondary | ICD-10-CM

## 2023-08-27 LAB — CUP PACEART REMOTE DEVICE CHECK
Battery Remaining Longevity: 114 mo
Battery Remaining Percentage: 100 %
Brady Statistic RA Percent Paced: 9 %
Brady Statistic RV Percent Paced: 100 %
Date Time Interrogation Session: 20250812004700
HighPow Impedance: 78 Ohm
Implantable Lead Connection Status: 753985
Implantable Lead Connection Status: 753985
Implantable Lead Connection Status: 753985
Implantable Lead Implant Date: 20220202
Implantable Lead Implant Date: 20230213
Implantable Lead Implant Date: 20230213
Implantable Lead Location: 753858
Implantable Lead Location: 753859
Implantable Lead Location: 753860
Implantable Lead Model: 273
Implantable Lead Model: 4671
Implantable Lead Model: 7841
Implantable Lead Serial Number: 111255
Implantable Lead Serial Number: 1233258
Implantable Lead Serial Number: 861310
Implantable Pulse Generator Implant Date: 20230213
Lead Channel Impedance Value: 418 Ohm
Lead Channel Impedance Value: 571 Ohm
Lead Channel Impedance Value: 795 Ohm
Lead Channel Pacing Threshold Amplitude: 1.1 V
Lead Channel Pacing Threshold Amplitude: 1.4 V
Lead Channel Pacing Threshold Pulse Width: 0.4 ms
Lead Channel Pacing Threshold Pulse Width: 0.4 ms
Lead Channel Setting Pacing Amplitude: 1.9 V
Lead Channel Setting Pacing Amplitude: 2 V
Lead Channel Setting Pacing Amplitude: 2.4 V
Lead Channel Setting Pacing Pulse Width: 0.4 ms
Lead Channel Setting Pacing Pulse Width: 0.4 ms
Lead Channel Setting Sensing Sensitivity: 0.6 mV
Lead Channel Setting Sensing Sensitivity: 1 mV
Pulse Gen Serial Number: 279753

## 2023-08-28 ENCOUNTER — Ambulatory Visit: Payer: Self-pay | Admitting: Cardiology

## 2023-09-03 ENCOUNTER — Ambulatory Visit
Admission: RE | Admit: 2023-09-03 | Discharge: 2023-09-03 | Disposition: A | Discharge status: Home / Self Care | Source: Ambulatory Visit | Attending: Student in an Organized Health Care Education/Training Program | Admitting: Student in an Organized Health Care Education/Training Program

## 2023-09-03 ENCOUNTER — Ambulatory Visit: Admitting: Student in an Organized Health Care Education/Training Program

## 2023-09-03 ENCOUNTER — Encounter: Payer: Self-pay | Admitting: Student in an Organized Health Care Education/Training Program

## 2023-09-03 VITALS — BP 131/96 | HR 55 | Temp 97.8°F | Resp 18 | Ht 72.0 in | Wt 198.0 lb

## 2023-09-03 DIAGNOSIS — M47816 Spondylosis without myelopathy or radiculopathy, lumbar region: Secondary | ICD-10-CM | POA: Diagnosis not present

## 2023-09-03 DIAGNOSIS — G894 Chronic pain syndrome: Secondary | ICD-10-CM

## 2023-09-03 MED ORDER — LIDOCAINE HCL 2 % IJ SOLN
INTRAMUSCULAR | Status: AC
  Filled 2023-09-03: qty 20

## 2023-09-03 MED ORDER — DEXAMETHASONE SODIUM PHOSPHATE 10 MG/ML IJ SOLN
INTRAMUSCULAR | Status: AC
  Filled 2023-09-03: qty 1

## 2023-09-03 MED ORDER — DIAZEPAM 5 MG PO TABS
5.0000 mg | ORAL_TABLET | ORAL | Status: AC
  Administered 2023-09-03: 5 mg via ORAL

## 2023-09-03 MED ORDER — DIAZEPAM 5 MG PO TABS
ORAL_TABLET | ORAL | Status: AC
  Filled 2023-09-03: qty 1

## 2023-09-03 MED ORDER — LIDOCAINE HCL 2 % IJ SOLN
20.0000 mL | Freq: Once | INTRAMUSCULAR | Status: AC
  Administered 2023-09-03: 400 mg

## 2023-09-03 MED ORDER — DEXAMETHASONE SODIUM PHOSPHATE 10 MG/ML IJ SOLN
20.0000 mg | Freq: Once | INTRAMUSCULAR | Status: AC
  Administered 2023-09-03: 20 mg

## 2023-09-03 MED ORDER — ROPIVACAINE HCL 2 MG/ML IJ SOLN
18.0000 mL | Freq: Once | INTRAMUSCULAR | Status: AC
  Administered 2023-09-03: 18 mL via PERINEURAL

## 2023-09-03 MED ORDER — ROPIVACAINE HCL 2 MG/ML IJ SOLN
INTRAMUSCULAR | Status: AC
  Filled 2023-09-03: qty 20

## 2023-09-03 NOTE — Patient Instructions (Addendum)
 ______________________________________________________________________   RESTART PLAVIX  TOMORROW, THURSDAY 09/04/2023. Post-Radiofrequency (RF) Discharge Instructions  You have just completed a Radiofrequency Neurotomy.  The following instructions will provide you with information and guidelines for self-care upon discharge.  If at any time you have questions or concerns please call your physician. DO NOT DRIVE YOURSELF!!  Instructions: Apply ice: Fill a plastic sandwich bag with crushed ice. Cover it with a small towel and apply to injection site. Apply for 15 minutes then remove x 15 minutes. Repeat sequence on day of procedure, until you go to bed. The purpose is to minimize swelling and discomfort after procedure. Apply heat: Apply heat to procedure site starting the day following the procedure. The purpose is to treat any soreness and discomfort from the procedure. Food intake: No eating limitations, unless stipulated above.  Nevertheless, if you have had sedation, you may experience some nausea.  In this case, it may be wise to wait at least two hours prior to resuming regular diet. Physical activities: Keep activities to a minimum for the first 8 hours after the procedure. For the first 24 hours after the procedure, do not drive a motor vehicle,  Operate heavy machinery, power tools, or handle any weapons.  Consider walking with the use of an assistive device or accompanied by an adult for the first 24 hours.  Do not drink alcoholic beverages including beer.  Do not make any important decisions or sign any legal documents. Go home and rest today.  Resume activities tomorrow, as tolerated.  Use caution in moving about as you may experience mild leg weakness.  Use caution in cooking, use of household electrical appliances and climbing steps. Driving: If you have received any sedation, you are not allowed to drive for 24 hours after your procedure. Blood thinner: Restart your blood thinner 6 hours  after your procedure. (Only for those taking blood thinners) Insulin : As soon as you can eat, you may resume your normal dosing schedule. (Only for those taking insulin ) Medications: May resume pre-procedure medications.  Do not take any drugs, other than what has been prescribed to you. Infection prevention: Keep procedure site clean and dry. Post-procedure Pain Diary: Extremely important that this be done correctly and accurately. Recorded information will be used to determine the next step in treatment. Pain evaluated is that of treated area only. Do not include pain from an untreated area. Complete every hour, on the hour, for the initial 8 hours. Set an alarm to help you do this part accurately. Do not go to sleep and have it completed later. It will not be accurate. Follow-up appointment: Keep your follow-up appointment after the procedure. Usually 2-6 weeks after radiofrequency. Bring you pain diary. The information collected will be essential for your long-term care.   Expect: From numbing medicine (AKA: Local Anesthetics): Numbness or decrease in pain. Onset: Full effect within 15 minutes of injected. Duration: It will depend on the type of local anesthetic used. On the average, 1 to 8 hours.  From steroids (when added): Decrease in swelling or inflammation. Once inflammation is improved, relief of the pain will follow. Onset of benefits: Depends on the amount of swelling present. The more swelling, the longer it will take for the benefits to be seen. In some cases, up to 10 days. Duration: Steroids will stay in the system x 2 weeks. Duration of benefits will depend on multiple posibilities including persistent irritating factors. From procedure: Some discomfort is to be expected once the numbing medicine wears off.  In the case of radiofrequency procedures, this may last as long as 6-8 weeks. Additional post-procedure pain medication is provided for this. Discomfort is minimized if ice and  heat are applied as instructed.  Call if: You experience numbness and weakness that gets worse with time, as opposed to wearing off. He experience any unusual bleeding, difficulty breathing, or loss of the ability to control your bowel and bladder. (This applies to Spinal procedures only) You experience any redness, swelling, heat, red streaks, elevated temperature, fever, or any other signs of a possible infection.  Emergency Numbers: Durning business hours (Monday - Thursday, 8:00 AM - 4:00 PM) (Friday, 9:00 AM - 12:00 Noon): [phone] After hours: [phone] ______________________________________________________________________

## 2023-09-03 NOTE — Progress Notes (Signed)
 PROVIDER NOTE: Interpretation of information contained herein should be left to medically-trained personnel. Specific patient instructions are provided elsewhere under Patient Instructions section of medical record. This document was created in part using STT-dictation technology, any transcriptional errors that may result from this process are unintentional.  Patient: [name] [name] Type: Established DOB: October 23, 1940 MRN: [id number] PCP: Dineen Rollene MATSU, [name]  Service: Procedure DOS: 09/03/2023 Setting: Ambulatory Location: Ambulatory outpatient facility Delivery: Face-to-face Provider: Wallie Sherry, MD Specialty: Interventional Pain Management Specialty designation: 09 Location: Outpatient facility Ref. Prov.: Dineen Rollene MATSU, [name]       Interventional Therapy   Procedure: Lumbar Facet, Medial Branch Radiofrequency Ablation (RFA) #1  Laterality: Bilateral (-50)  Level: L3, L4, and L5 Medial Branch Level(s). These levels will denervate the L3-4 and L4-5 lumbar facet joints.  Imaging: Fluoroscopy-guided         Anesthesia: Local anesthesia (1-2% Lidocaine ) Sedation: Minimal Sedation                       DOS: 09/03/2023  Performed by: Wallie Sherry, MD  Purpose: Therapeutic/Palliative Indications: Low back pain severe enough to impact quality of life or function. Indications: 1. Lumbar spondylosis   2. Lumbar facet arthropathy   3. Chronic pain syndrome    [name] [name] has been dealing with the above chronic pain for longer than three months and has either failed to respond, was unable to tolerate, or simply did not get enough benefit from other more conservative therapies including, but not limited to: 1. Over-the-counter medications 2. Anti-inflammatory medications 3. Muscle relaxants 4. Membrane stabilizers 5. Opioids 6. Physical therapy and/or chiropractic manipulation 7. Modalities (Heat, ice, etc.) 8. Invasive techniques such as nerve blocks. [name] [name] has  attained more than 50% relief of the pain from a series of diagnostic injections conducted in separate occasions.  Pain Score: Pre-procedure: 2 /10 Post-procedure: 2 /10        Position / Prep / Materials:  Position: Prone  Prep solution: ChloraPrep (2% chlorhexidine  gluconate and 70% isopropyl alcohol ) Prep Area: Entire Lumbosacral Region (Lower back from mid-thoracic region to end of tailbone and from flank to flank.) Materials:  Tray: RFA (Radiofrequency) tray Needle(s):  Type: RFA (Teflon-coated radiofrequency ablation needles) Gauge (G): 20  Length: Regular (10cm) Qty: 6     H&P (Pre-op Assessment):  [name] [name] is a 83 y.o. (year old), male patient, seen today for interventional treatment. He  has a past surgical history that includes Cholecystectomy (2012); Tonsillectomy (1945); Lithotripsy (2015); Melanoma excision (1980); Nerve Surgery (2015); Coronary angioplasty with stent (1991 & 2005); Wisdom tooth extraction; Colonoscopy; Esophagogastroduodenoscopy (egd) with propofol  (N/A, 11/19/2016); Cystoscopy/ureteroscopy/holmium laser/stent placement (Right, 01/02/2018); Esophagogastroduodenoscopy (egd) with propofol  (N/A, 02/02/2019); LEFT HEART CATH AND CORONARY ANGIOGRAPHY (N/A, 07/07/2019); CORONARY PRESSURE/FFR STUDY (N/A, 07/07/2019); CORONARY ATHERECTOMY (N/A, 07/08/2019); Left Heart Cath (N/A, 07/08/2019); Coronary Ultrasound/IVUS (N/A, 07/08/2019); CORONARY STENT INTERVENTION (N/A, 07/08/2019); CORONARY BALLOON ANGIOPLASTY (N/A, 07/08/2019); ICD IMPLANT (N/A, 02/02/2020); LEAD REVISION/REPAIR (N/A, 02/16/2020); BIV UPGRADE (N/A, 02/26/2021); pre cancer lesion from scalp (09/2022); and RIGHT HEART CATH AND CORONARY ANGIOGRAPHY (Bilateral, 11/26/2022). [name] [name] has a current medication list which includes the following prescription(s): amiodarone , aspirin  ec, azelastine , bisacodyl, onetouch verio flex system, blood glucose monitoring suppl, carvedilol , clopidogrel ,  cyclobenzaprine , dextromethorphan-guaifenesin, ezetimibe , famotidine, gabapentin , glipizide , glucosamine, glucose blood, dry eye relief drops, onetouch ultrasoft, lisinopril , midodrine , multivitamin, NON FORMULARY, omega-3 fatty acids, pantoprazole , psyllium, rosuvastatin , semaglutide  (1 mg/dose), sodium chloride , spironolactone , tamsulosin , tizanidine , tramadol , and UNABLE TO FIND. His primarily concern today  is the Back Pain (lower)  Initial Vital Signs:  Pulse/HCG Rate: (!) 55ECG Heart Rate: (!) 56 Temp: 97.8 F (36.6 C) Resp: 16 BP: 127/78 SpO2: 98 %  BMI: Estimated body mass index is 26.85 kg/m as calculated from the following:   Height as of this encounter: 6' (1.829 m).   Weight as of this encounter: 198 lb (89.8 kg).  Risk Assessment: Allergies: Reviewed. He is allergic to metformin  and related, azithromycin, other, percocet [oxycodone -acetaminophen ], and jardiance  [email or web address] ].  Allergy Precautions: None required Coagulopathies: Reviewed. None identified.  Blood-thinner therapy: None at this time Active Infection(s): Reviewed. None identified. [name] [name] is afebrile  Site Confirmation: [name] [name] was asked to confirm the procedure and laterality before marking the site Procedure checklist: Completed Consent: Before the procedure and under the influence of no sedative(s), amnesic(s), or anxiolytics, the patient was informed of the treatment options, risks and possible complications. To fulfill our ethical and legal obligations, as recommended by the American Medical Association's Code of Ethics, I have informed the patient of my clinical impression; the nature and purpose of the treatment or procedure; the risks, benefits, and possible complications of the intervention; the alternatives, including doing nothing; the risk(s) and benefit(s) of the alternative treatment(s) or procedure(s); and the risk(s) and benefit(s) of doing nothing. The patient was provided information about  the general risks and possible complications associated with the procedure. These may include, but are not limited to: failure to achieve desired goals, infection, bleeding, organ or nerve damage, allergic reactions, paralysis, and death. In addition, the patient was informed of those risks and complications associated to Spine-related procedures, such as failure to decrease pain; infection (i.e.: Meningitis, epidural or intraspinal abscess); bleeding (i.e.: epidural hematoma, subarachnoid hemorrhage, or any other type of intraspinal or peri-dural bleeding); organ or nerve damage (i.e.: Any type of peripheral nerve, nerve root, or spinal cord injury) with subsequent damage to sensory, motor, and/or autonomic systems, resulting in permanent pain, numbness, and/or weakness of one or several areas of the body; allergic reactions; (i.e.: anaphylactic reaction); and/or death. Furthermore, the patient was informed of those risks and complications associated with the medications. These include, but are not limited to: allergic reactions (i.e.: anaphylactic or anaphylactoid reaction(s)); adrenal axis suppression; blood sugar elevation that in diabetics may result in ketoacidosis or comma; water retention that in patients with history of congestive heart failure may result in shortness of breath, pulmonary edema, and decompensation with resultant heart failure; weight gain; swelling or edema; medication-induced neural toxicity; particulate matter embolism and blood vessel occlusion with resultant organ, and/or nervous system infarction; and/or aseptic necrosis of one or more joints. Finally, the patient was informed that Medicine is not an exact science; therefore, there is also the possibility of unforeseen or unpredictable risks and/or possible complications that may result in a catastrophic outcome. The patient indicated having understood very clearly. We have given the patient no guarantees and we have made no  promises. Enough time was given to the patient to ask questions, all of which were answered to the patient's satisfaction. [name] [name] has indicated that he wanted to continue with the procedure. Attestation: I, the ordering provider, attest that I have discussed with the patient the benefits, risks, side-effects, alternatives, likelihood of achieving goals, and potential problems during recovery for the procedure that I have provided informed consent. Date  Time: 09/03/2023  7:55 AM  Pre-Procedure Preparation:  Monitoring: As per clinic protocol. Respiration, ETCO2, SpO2, BP, heart rate and rhythm monitor placed and checked  for adequate function Safety Precautions: Patient was assessed for positional comfort and pressure points before starting the procedure. Time-out: I initiated and conducted the Time-out before starting the procedure, as per protocol. The patient was asked to participate by confirming the accuracy of the Time Out information. Verification of the correct person, site, and procedure were performed and confirmed by me, the nursing staff, and the patient. Time-out conducted as per Joint Commission's Universal Protocol ([email or web address].01.01.01). Time: 0828 Start Time: 0829 hrs.  Description of Procedure:          Laterality: See above. Levels:  See above. Safety Precautions: Aspiration looking for blood return was conducted prior to all injections. At no point did we inject any substances, as a needle was being advanced. Before injecting, the patient was told to immediately notify me if he was experiencing any new onset of ringing in the ears, or metallic taste in the mouth. No attempts were made at seeking any paresthesias. Safe injection practices and needle disposal techniques used. Medications properly checked for expiration dates. SDV (single dose vial) medications used. After the completion of the procedure, all disposable equipment used was discarded in the proper designated medical  waste containers. Local Anesthesia: Protocol guidelines were followed. The patient was positioned over the fluoroscopy table. The area was prepped in the usual manner. The time-out was completed. The target area was identified using fluoroscopy. A 12-in long, straight, sterile hemostat was used with fluoroscopic guidance to locate the targets for each level blocked. Once located, the skin was marked with an approved surgical skin marker. Once all sites were marked, the skin (epidermis, dermis, and hypodermis), as well as deeper tissues (fat, connective tissue and muscle) were infiltrated with a small amount of a short-acting local anesthetic, loaded on a 10cc syringe with a 25G, 1.5-in  Needle. An appropriate amount of time was allowed for local anesthetics to take effect before proceeding to the next step. Technical description of process:  Radiofrequency Ablation (RFA)  L3 Medial Branch Nerve RFA: The target area for the L3 medial branch is at the junction of the postero-lateral aspect of the superior articular process and the superior, posterior, and medial edge of the transverse process of L4. Under fluoroscopic guidance, a Radiofrequency needle was inserted until contact was made with os over the superior postero-lateral aspect of the pedicular shadow (target area). Sensory and motor testing was conducted to properly adjust the position of the needle. Once satisfactory placement of the needle was achieved, the numbing solution was slowly injected after negative aspiration for blood. 2.0 mL of the nerve block solution was injected without difficulty or complication. After waiting for at least 3 minutes, the ablation was performed. Once completed, the needle was removed intact. L4 Medial Branch Nerve RFA: The target area for the L4 medial branch is at the junction of the postero-lateral aspect of the superior articular process and the superior, posterior, and medial edge of the transverse process of L5.  Under fluoroscopic guidance, a Radiofrequency needle was inserted until contact was made with os over the superior postero-lateral aspect of the pedicular shadow (target area). Sensory and motor testing was conducted to properly adjust the position of the needle. Once satisfactory placement of the needle was achieved, the numbing solution was slowly injected after negative aspiration for blood. 2.0 mL of the nerve block solution was injected without difficulty or complication. After waiting for at least 3 minutes, the ablation was performed. Once completed, the needle was removed intact. L5 Medial Branch  Nerve RFA: The target area for the L5 medial branch is at the junction of the postero-lateral aspect of the superior articular process of S1 and the superior, posterior, and medial edge of the sacral ala. Under fluoroscopic guidance, a Radiofrequency needle was inserted until contact was made with os over the superior postero-lateral aspect of the pedicular shadow (target area). Sensory and motor testing was conducted to properly adjust the position of the needle. Once satisfactory placement of the needle was achieved, the numbing solution was slowly injected after negative aspiration for blood. 2.0 mL of the nerve block solution was injected without difficulty or complication. After waiting for at least 3 minutes, the ablation was performed. Once completed, the needle was removed intact.  Radiofrequency lesioning (ablation):  Radiofrequency Generator: Medtronic AccurianTM AG 1000 RF Generator Sensory Stimulation Parameters: 50 Hz was used to locate & identify the nerve, making sure that the needle was positioned such that there was no sensory stimulation below 0.3 V or above 0.7 V. Motor Stimulation Parameters: 2 Hz was used to evaluate the motor component. Care was taken not to lesion any nerves that demonstrated motor stimulation of the lower extremities at an output of less than 2.5 times that of the  sensory threshold, or a maximum of 2.0 V. Lesioning Technique Parameters: Standard Radiofrequency settings. (Not bipolar or pulsed.) Temperature Settings: 80 degrees C Lesioning time: 60 seconds Stationary intra-operative compliance: Compliant  Once the entire procedure was completed, the treated area was cleaned, making sure to leave some of the prepping solution back to take advantage of its long term bactericidal properties.    Illustration of the posterior view of the lumbar spine and the posterior neural structures. Laminae of L2 through S1 are labeled. DPRL5, dorsal primary ramus of L5; DPRS1, dorsal primary ramus of S1; DPR3, dorsal primary ramus of L3; FJ, facet (zygapophyseal) joint L3-L4; I, inferior articular process of L4; LB1, lateral branch of dorsal primary ramus of L1; IAB, inferior articular branches from L3 medial branch (supplies L4-L5 facet joint); IBP, intermediate branch plexus; MB3, medial branch of dorsal primary ramus of L3; NR3, third lumbar nerve root; S, superior articular process of L5; SAB, superior articular branches from L4 (supplies L4-5 facet joint also); TP3, transverse process of L3.  Facet Joint Innervation (* possible contribution)  L1-2 T12, L1 (L2*)  Medial Branch  L2-3 L1, L2 (L3*)                     L3-4 L2, L3 (L4*)                     L4-5 L3, L4 (L5*)                     L5-S1 L4, L5, S1                        Vitals:   09/03/23 0831 09/03/23 0836 09/03/23 0841 09/03/23 0846  BP: (!) 134/90 122/87 109/81 (!) 131/96  Pulse:      Resp: 17 18 17 18   Temp:      TempSrc:      SpO2: 99% 97% 97% 97%  Weight:      Height:        Start Time: 0829 hrs. End Time: 0846 hrs.  Imaging Guidance (Spinal):         Type of Imaging Technique: Fluoroscopy Guidance (Spinal) Indication(s): Fluoroscopy guidance for needle placement  to enhance accuracy in procedures requiring precise needle localization for targeted delivery of medication in or near  specific anatomical locations not easily accessible without such real-time imaging assistance. Exposure Time: Please see nurses notes. Contrast: None used. Fluoroscopic Guidance: I was personally present during the use of fluoroscopy. Tunnel Vision Technique used to obtain the best possible view of the target area. Parallax error corrected before commencing the procedure. Direction-depth-direction technique used to introduce the needle under continuous pulsed fluoroscopy. Once target was reached, antero-posterior, oblique, and lateral fluoroscopic projection used confirm needle placement in all planes. Images permanently stored in EMR. Interpretation: No contrast injected. I personally interpreted the imaging intraoperatively. Adequate needle placement confirmed in multiple planes. Permanent images saved into the patient's record.  Antibiotic Prophylaxis:   Anti-infectives (From admission, onward)    None      Indication(s): None identified  Post-operative Assessment:  Post-procedure Vital Signs:  Pulse/HCG Rate: (!) 55(!) 50 Temp: 97.8 F (36.6 C) Resp: 18 BP: (!) 131/96 SpO2: 97 %  EBL: None  Complications: No immediate post-treatment complications observed by team, or reported by patient.  Note: The patient tolerated the entire procedure well. A repeat set of vitals were taken after the procedure and the patient was kept under observation following institutional policy, for this type of procedure. Post-procedural neurological assessment was performed, showing return to baseline, prior to discharge. The patient was provided with post-procedure discharge instructions, including a section on how to identify potential problems. Should any problems arise concerning this procedure, the patient was given instructions to immediately contact us , at any time, without hesitation. In any case, we plan to contact the patient by telephone for a follow-up status report regarding this  interventional procedure.  Comments:  No additional relevant information.  Plan of Care (POC)  Orders:  Orders Placed This Encounter  Procedures   DG PAIN [facility] C-ARM 1-60 MIN NO REPORT    Intraoperative interpretation by procedural physician at [facility].    Standing Status:   Standing    Number of Occurrences:   1    Reason for exam::   Assistance in needle guidance and placement for procedures requiring needle placement in or near specific anatomical locations not easily accessible without such assistance.    Medications ordered for procedure: Meds ordered this encounter  Medications   lidocaine  (XYLOCAINE ) 2 % (with pres) injection 400 mg   diazepam  (VALIUM ) tablet 5 mg    Make sure Flumazenil is available in the pyxis when using this medication. If oversedation occurs, administer 0.2 mg IV over 15 sec. If after 45 sec no response, administer 0.2 mg again over 1 min; may repeat at 1 min intervals; not to exceed 4 doses (1 mg)   ropivacaine  (PF) 2 mg/mL (0.2%) (NAROPIN ) injection 18 mL   dexamethasone  (DECADRON ) injection 20 mg   Medications administered: We administered lidocaine , diazepam , ropivacaine  (PF) 2 mg/mL (0.2%), and dexamethasone .  See the medical record for exact dosing, route, and time of administration.    BLF L3-5 05/26/23, 07/21/23; RFA 09/03/23     Follow-up plan:   Return in about 8 weeks (around 10/29/2023) for PPE, F2F.     Recent Visits Date Type Provider Dept  08/19/23 Office Visit Marcelino Nurse, MD [facility]  07/21/23 Procedure visit Marcelino Nurse, MD [facility]  06/17/23 Office Visit Marcelino Nurse, MD [facility]  Showing recent visits within past 90 days and meeting all other requirements Today's Visits Date Type Provider Dept  09/03/23 Procedure visit Marcelino Nurse, MD [facility]  Showing today's visits and meeting all other requirements Future Appointments Date Type Provider Dept   10/30/23 Appointment Marcelino Nurse, MD [facility]  Showing future appointments within next 90 days and meeting all other requirements   Disposition: Discharge home  Discharge (Date  Time): 09/03/2023; 0900 hrs.   Primary Care Physician: Dineen Rollene MATSU, [name] Location: [facility] Note by: Nurse Marcelino, MD (TTS technology used. I apologize for any typographical errors that were not detected and corrected.) Date: 09/03/2023; Time: 9:03 AM  Disclaimer:  Medicine is not an Visual merchandiser. The only guarantee in medicine is that nothing is guaranteed. It is important to note that the decision to proceed with this intervention was based on the information collected from the patient. The Data and conclusions were drawn from the patient's questionnaire, the interview, and the physical examination. Because the information was provided in large part by the patient, it cannot be guaranteed that it has not been purposely or unconsciously manipulated. Every effort has been made to obtain as much relevant data as possible for this evaluation. It is important to note that the conclusions that lead to this procedure are derived in large part from the available data. Always take into account that the treatment will also be dependent on availability of resources and existing treatment guidelines, considered by other Pain Management Practitioners as being common knowledge and practice, at the time of the intervention. For Medico-Legal purposes, it is also important to point out that variation in procedural techniques and pharmacological choices are the acceptable norm. The indications, contraindications, technique, and results of the above procedure should only be interpreted and judged by a Board-Certified Interventional Pain Specialist with extensive familiarity and expertise in the same exact procedure and technique.

## 2023-09-04 NOTE — Telephone Encounter (Signed)
 Post procedure follow up.  LM

## 2023-09-08 ENCOUNTER — Ambulatory Visit (INDEPENDENT_AMBULATORY_CARE_PROVIDER_SITE_OTHER): Admitting: Family

## 2023-09-08 ENCOUNTER — Encounter: Payer: Self-pay | Admitting: Family

## 2023-09-08 VITALS — BP 100/68 | HR 63 | Temp 97.8°F | Ht 72.0 in | Wt 206.4 lb

## 2023-09-08 DIAGNOSIS — I10 Essential (primary) hypertension: Secondary | ICD-10-CM | POA: Diagnosis not present

## 2023-09-08 DIAGNOSIS — Z7984 Long term (current) use of oral hypoglycemic drugs: Secondary | ICD-10-CM

## 2023-09-08 DIAGNOSIS — M545 Low back pain, unspecified: Secondary | ICD-10-CM | POA: Diagnosis not present

## 2023-09-08 DIAGNOSIS — I739 Peripheral vascular disease, unspecified: Secondary | ICD-10-CM

## 2023-09-08 DIAGNOSIS — E118 Type 2 diabetes mellitus with unspecified complications: Secondary | ICD-10-CM | POA: Diagnosis not present

## 2023-09-08 DIAGNOSIS — R7309 Other abnormal glucose: Secondary | ICD-10-CM

## 2023-09-08 DIAGNOSIS — E1165 Type 2 diabetes mellitus with hyperglycemia: Secondary | ICD-10-CM | POA: Diagnosis not present

## 2023-09-08 DIAGNOSIS — E782 Mixed hyperlipidemia: Secondary | ICD-10-CM

## 2023-09-08 DIAGNOSIS — G8929 Other chronic pain: Secondary | ICD-10-CM

## 2023-09-08 LAB — MICROALBUMIN / CREATININE URINE RATIO
Creatinine,U: 78.8 mg/dL
Microalb Creat Ratio: 13.7 mg/g (ref 0.0–30.0)
Microalb, Ur: 1.1 mg/dL (ref 0.0–1.9)

## 2023-09-08 LAB — POCT GLYCOSYLATED HEMOGLOBIN (HGB A1C): Hemoglobin A1C: 7.3 % — AB (ref 4.0–5.6)

## 2023-09-08 MED ORDER — GLIPIZIDE 10 MG PO TABS
ORAL_TABLET | ORAL | 3 refills | Status: DC
Start: 2023-09-08 — End: 2023-12-09

## 2023-09-08 MED ORDER — LISINOPRIL 5 MG PO TABS
5.0000 mg | ORAL_TABLET | Freq: Every day | ORAL | 3 refills | Status: DC
Start: 2023-09-08 — End: 2023-12-03

## 2023-09-08 NOTE — Progress Notes (Unsigned)
 Assessment & Plan:  Type 2 diabetes mellitus with hyperglycemia, without long-term current use of insulin  (HCC) -     glipiZIDE ; TAKE 1 TABLET BY MOUTH EVERY MORNING WITH BREAKFAST AND 1 TABLET EVERY EVENING WITH SUPPER.  Dispense: 120 tablet; Refill: 3  Elevated glucose -     POCT glycosylated hemoglobin (Hb A1C)  Essential hypertension Assessment & Plan: Blood pressure has been labile. SBP soft.  Advised again to trial decrease lisinopril  from 10 mg daily to 5 mg daily.  Continue amiodarone  200 mg daily, carvedilol  3.125 mg twice daily, spironolactone  25 mg.  Continue midodrine  2.5mg  every day prn as managed by Dr [name].   Orders: -     Lisinopril ; Take 1 tablet (5 mg total) by mouth daily.  Dispense: 90 tablet; Refill: 3  Controlled type 2 diabetes mellitus with complication, without long-term current use of insulin  [facility]) Assessment & Plan: Chronic, overall stable.   Lab Results  Component Value Date   HGBA1C 7.3 (A) 09/08/2023    Blood sugar levels increased after nerve ablation but have since decreased.  Continue glipizide  10 mg twice daily, Ozempic  1 mg weekly.  Discussed constipation as a side effect of Ozempic  and advised titration of MiraLAX.  He may continue Metamucil, daily to as needed use of Colace  Orders: -     Microalbumin / creatinine urine ratio  Chronic low back pain, unspecified back pain laterality, unspecified whether sciatica present Assessment & Plan: Chronic, improved .Continue tramadol  50 mg twice daily as needed, Tylenol  arthritis 650mg  qd, gabapentin  100mg  qpm.    Orders: -     Gabapentin ; Take 1 capsule (100 mg total) by mouth at bedtime.  PAD (peripheral artery disease) (HCC) Assessment & Plan: Chronic, stable.  Reviewed previous ABI study.  No overt symptoms of claudication at this time.  Currently on aspirin  81 mg daily, Plavix  75 mg twice daily.He declines vascular consult. Discuss ultrasound findings with Dr. Perla   Mixed  hyperlipidemia Assessment & Plan: Lab Results  Component Value Date   LDLCALC 35 12/21/2019   Pending lipid panel. Continue rosuvastatin  10mg  every other day d/t to side effects with fish oil.       Return precautions given.   Risks, benefits, and alternatives of the medications and treatment plan prescribed today were discussed, and patient expressed understanding.   Education regarding symptom management and diagnosis given to patient on AVS either electronically or printed.  No follow-ups on file.  [name], [name]  Subjective:    Patient ID: [name] [name], male    DOB: 1940-06-19, 83 y.o.   MRN: [id number]  CC: [name] [name] is a 83 y.o. male who presents today for follow up.   HPI: HPI Discussed the use of AI scribe software for clinical note transcription with the patient, who gave verbal consent to proceed.  History of Present Illness   [name] [name] is an 83 year old male with diabetes and hypertension who presents for follow-up after a recent ablation procedure.  S/p radiofrequency ablation Dr marcelino 09/03/23 ablation procedure last Tuesday and his pain level is improving. He experienced a significant increase in blood sugar levels post-procedure, reaching the 300s. His blood sugar has since decreased to 157 fasting this morning.  His diabetes management includes glipizide , which he increased from half a tablet to a full tablet in the morning and evening (10 mg each). Fasting blood sugar levels have been gradually increasing from 110-130 to 130-150s. He is also on semaglutide  1 mg  weekly and reports occasional constipation, which he manages with psyllium fiber and increased water intake. He uses a stool softener as needed.  He experiences low systolic blood pressure, which was 106-108 mmHg recently. He uses midodrine  half a tablet every day prn.He is unsure about his current lisinopril  dosage but recalls a previous reduction from 10 mg to 5 mg.  He  takes rosuvastatin  every other day, supplemented with fish oil, due to concerns about statin side effects. He takes gabapentin  100mg  at night for neuropathy, which helps him sleep when combined with Tylenol .   Denies SOB, CP.     Compliant with tramadol  50 mg twice daily as needed, Tylenol  arthritis 650mg  at bedtime, gabapentin  100mg  qhs   Allergies: Metformin  and related, Azithromycin, Other, Percocet [oxycodone -acetaminophen ], and Jardiance  [empagliflozin ] Current Outpatient Medications on File Prior to Visit  Medication Sig Dispense Refill   amiodarone  (PACERONE ) 200 MG tablet Take 1 tablet (200 mg total) by mouth daily. 90 tablet 3   aspirin  EC 81 MG tablet Take 1 tablet (81 mg total) by mouth daily. Swallow whole. 90 tablet 3   azelastine  (ASTELIN ) 0.1 % nasal spray Place 1 spray into both nostrils 2 (two) times daily. Use in each nostril as directed (Patient taking differently: Place 1 spray into both nostrils 2 (two) times daily as needed for rhinitis. Use in each nostril as directed) 30 mL 6   bisacodyl (DULCOLAX) 5 MG EC tablet Take 5 mg by mouth daily as needed for moderate constipation.     Blood Glucose Monitoring Suppl (ONETOUCH VERIO FLEX SYSTEM) w/Device KIT      Blood Glucose Monitoring Suppl DEVI 1 each by Does not apply route in the morning and at bedtime. May substitute to any manufacturer covered by patient's insurance. 1 each 0   carvedilol  (COREG ) 3.125 MG tablet Take 1 tablet (3.125 mg total) by mouth 2 (two) times daily with a meal. 180 tablet 3   clopidogrel  (PLAVIX ) 75 MG tablet TAKE 1 TABLET(75 MG) BY MOUTH DAILY 30 tablet 11   cyclobenzaprine  (FLEXERIL ) 5 MG tablet Take 5 mg by mouth as needed for muscle spasms.     dextromethorphan-guaiFENesin (MUCINEX DM) 30-600 MG 12hr tablet Take 1 tablet by mouth daily as needed (Sinus Congestion).     ezetimibe  (ZETIA ) 10 MG tablet TAKE 1 TABLET(10 MG) BY MOUTH DAILY 90 tablet 3   famotidine (PEPCID) 20 MG tablet Take 20 mg by  mouth daily with lunch.     Glucosamine 500 MG CAPS Take 1,500 mg by mouth daily as needed.     glucose blood test strip Use as instructed 100 each 12   Glycerin-Hypromellose-PEG 400 (DRY EYE RELIEF DROPS) 0.2-0.2-1 % SOLN Place 1 drop into both eyes daily.     Lancets (ONETOUCH ULTRASOFT) lancets Use as instructed 100 each 12   midodrine  (PROAMATINE ) 5 MG tablet Take 1 tablet (5 mg total) by mouth 3 (three) times daily as needed. Take when BP does drop less than 110 when standing. Additional dose at 2 pm if still dropping below 110 when standing 90 tablet 3   Multiple Vitamin (MULTIVITAMIN) tablet Take 1 tablet by mouth daily with lunch.     NON FORMULARY Ocean Better Fiber one capsule daily   Contains slipper Elm & psyllium Husk & ginger     Omega-3 Fatty Acids (FISH OIL PO) Take 1,500 mg by mouth daily with lunch.     pantoprazole  (PROTONIX ) 40 MG tablet Take 1 tablet (40 mg total) by mouth  daily. 90 tablet 3   Psyllium 83 % POWD Take 15 mLs by mouth at bedtime.     rosuvastatin  (CRESTOR ) 10 MG tablet Take 1 tablet (10 mg total) by mouth every other day. 45 tablet 1   Semaglutide , 1 MG/DOSE, 4 MG/3ML SOPN Inject 1 mg as directed once a week. 3 mL 3   sodium chloride  (OCEAN) 0.65 % SOLN nasal spray Place 1 spray into both nostrils daily as needed for congestion (Sinus).     spironolactone  (ALDACTONE ) 25 MG tablet TAKE 1 TABLET(25 MG) BY MOUTH DAILY 90 tablet 1   tamsulosin  (FLOMAX ) 0.4 MG CAPS capsule TAKE ONE CAPSULE BY MOUTH DAILY 90 capsule 3   traMADol  (ULTRAM ) 50 MG tablet TAKE 1 TABLET BY MOUTH EVERY 12 HOURS AS NEEDED 60 tablet 2   UNABLE TO FIND Take 75 mg by mouth daily. Med Name: SLIPPERY ELM     No current facility-administered medications on file prior to visit.    Review of Systems  Constitutional:  Negative for chills and fever.  Respiratory:  Negative for cough.   Cardiovascular:  Negative for chest pain and palpitations.  Gastrointestinal:  Negative for nausea and  vomiting.      Objective:    BP 100/68   Pulse 63   Temp 97.8 F (36.6 C) (Oral)   Ht 6' (1.829 m)   Wt 206 lb 6.4 oz (93.6 kg)   SpO2 95%   BMI 27.99 kg/m  BP Readings from Last 3 Encounters:  09/08/23 100/68  09/03/23 (!) 131/96  08/19/23 92/65   Wt Readings from Last 3 Encounters:  09/08/23 206 lb 6.4 oz (93.6 kg)  09/03/23 198 lb (89.8 kg)  08/19/23 198 lb (89.8 kg)    Physical Exam Vitals reviewed.  Constitutional:      Appearance: He is well-developed.  Cardiovascular:     Rate and Rhythm: Regular rhythm.     Heart sounds: Normal heart sounds.  Pulmonary:     Effort: Pulmonary effort is normal. No respiratory distress.     Breath sounds: Normal breath sounds. No wheezing, rhonchi or rales.  Skin:    General: Skin is warm and dry.  Neurological:     Mental Status: He is alert.  Psychiatric:        Speech: Speech normal.        Behavior: Behavior normal.

## 2023-09-08 NOTE — Patient Instructions (Addendum)
 Trial lisinopril  5mg  daily to allow for blood pressure to increase slightly   start taking MiraLAX, half a dose every day or every other day and change the dose as needed after 3 to 5 days with goal of 1-2 soft bowel movements every day or every other day. MiraLAX is an osmotic laxative. That means it draws water into the colon, which softens the stool and may naturally stimulate the colon to contract. These actions help ease bowel movements. For example, you  may find that using the medication every other day or three times a week is a good bowel regimen for you. Or perhaps, twice weekly.   Take Colace ( stool softener) twice daily every day or every other day if needed.   It is MOST important to drink LOTS of water and follow a HIGH fiber diet to keep foods moving through the gut. You may add Metamucil to a beverage that you drink.  Information on prevention of constipation as well as acute treatment for constipation as included below.  If there is no improvement in your symptoms, or if there is any worsening of symptoms, or if you have any additional concerns, please return to this clinic for re-evaluation; or, if we are closed, consider going to the Emergency Room for evaluation.    Constipation Prevention What is Constipation? Constipation is hard, dry bowel movements or the inability to have a bowel movement.  You can also feel like you need to have a bowel movement but not be able to.  It can also be painful when you strain to have a bowel movement.  Taking narcotic pain medicine after surgery can make you constipated, even if you have never had a problem with constipation. What Do I Need To Do? The best thing to do for constipation is to keep it from happening.  This can be done by: Adding laxatives to your daily routine, when taking prescription pain medicines after surgery. Add 17 gm Miralax daily or 100 mg Colace once or twice daily. (Miralax is mixed in water. Colace is a pill). They  soften your bowel movements to make them easier to pass and hurt less. Drink plenty of water to help flush your bowels.  (Eight, 8 ounce glasses daily) Eat foods high in fiber such as whole grains, vegetables, cereals, fruits, and prune juice (5-7 servings a day or 25 grams).  If you do not know how much fiber a food has in it you can look on the label under "dietary fiber."  If you have trouble getting enough fiber in your diet you may want to consider a fiber supplement such as Metamucil or Citrucel.  Also, be aware that eating fiber without drinking enough water can make constipation worse. If you do become constipated some medications that may help are: Bisacodyl (Dulcolax) is available in tablet form or a suppository. Glycerin suppositories are also a good choice if you need a fast acting medication. Everybody is different and may have different results.  Talk to your pharmacist or health care provider about your specific problems. They can help you choose the best product for you.  Why Is It Important for Me To Do This? Being constipated is not something you have to live with.  There are many things you can do to help.   Feeling bad can interfere with your recovery after surgery.  If constipation goes on for too long it can become a very serious medical problem. You may need to visit your doctor  or go to the hospital.  That is why it is very important to drink lots of water, eat enough fiber, and keep it from happening.  Ask Questions We want to answer all of your questions and concerns.  That's why we encourage you to use a program called Ask Me 3T, created by the [employer].  By using Ask Me 3T you are encouraged to ask 3 simple (yet, potentially life saving questions) whenever you are talking with your physician, nurse or pharmacist: What is my main problem? What do I need to do? Why is it important for me to do this? By understanding the answer to these three  questions and any other questions you may have, you have the knowledge necessary to manage your health. Please feel very comfortable asking any questions. Healthcare is complicated, so if you hear an answer you do not understand, please ask your health care team to explain again.   Sources: [employer] 11-02-09 N

## 2023-09-09 ENCOUNTER — Ambulatory Visit: Payer: Self-pay | Admitting: Urology

## 2023-09-09 ENCOUNTER — Ambulatory Visit: Payer: Self-pay | Admitting: Family

## 2023-09-09 ENCOUNTER — Telehealth: Payer: Self-pay

## 2023-09-09 DIAGNOSIS — I739 Peripheral vascular disease, unspecified: Secondary | ICD-10-CM | POA: Insufficient documentation

## 2023-09-09 MED ORDER — GABAPENTIN 100 MG PO CAPS: 100.0000 mg | ORAL_CAPSULE | Freq: Every day | ORAL | Status: AC

## 2023-09-09 NOTE — Assessment & Plan Note (Addendum)
 Blood pressure has been labile. SBP soft.  Advised again to trial decrease lisinopril  from 10 mg daily to 5 mg daily.  Continue amiodarone  200 mg daily, carvedilol  3.125 mg twice daily, spironolactone  25 mg.  Continue midodrine  2.5mg  every day prn as managed by Dr [name].

## 2023-09-09 NOTE — Assessment & Plan Note (Signed)
 Chronic, improved .Continue tramadol  50 mg twice daily as needed, Tylenol  arthritis 650mg  qd, gabapentin  100mg  qpm.

## 2023-09-09 NOTE — Assessment & Plan Note (Signed)
 Lab Results  Component Value Date   LDLCALC 35 12/21/2019   Pending lipid panel. Continue rosuvastatin  10mg  every other day d/t to side effects with fish oil.

## 2023-09-09 NOTE — Assessment & Plan Note (Signed)
 Chronic, stable.  Reviewed previous ABI study.  No overt symptoms of claudication at this time.  Currently on aspirin  81 mg daily, Plavix  75 mg twice daily.He declines vascular consult. Discuss ultrasound findings with Dr. [name]

## 2023-09-09 NOTE — Assessment & Plan Note (Signed)
 Chronic, overall stable.   Lab Results  Component Value Date   HGBA1C 7.3 (A) 09/08/2023    Blood sugar levels increased after nerve ablation but have since decreased.  Continue glipizide  10 mg twice daily, Ozempic  1 mg weekly.  Discussed constipation as a side effect of Ozempic  and advised titration of MiraLAX.  He may continue Metamucil, daily to as needed use of Colace

## 2023-09-09 NOTE — Telephone Encounter (Signed)
 Completed refill/ order form for Ozempic  (Novo Nordisk) and faxed to provider's office for review and signature.

## 2023-09-22 ENCOUNTER — Telehealth: Payer: Self-pay | Admitting: *Deleted

## 2023-09-22 NOTE — Telephone Encounter (Signed)
Pt has picked up medication.  

## 2023-09-22 NOTE — Telephone Encounter (Signed)
 Patient notified that Patient Assistance Medication are in the office & are ready for pick up.   Medication: Ozempic  3ml  Quantity: 4 boxes  Lot# [id number]  Exp: 12/13/2025

## 2023-09-27 ENCOUNTER — Other Ambulatory Visit: Payer: Self-pay | Admitting: Family

## 2023-09-27 DIAGNOSIS — G8929 Other chronic pain: Secondary | ICD-10-CM

## 2023-09-28 NOTE — Telephone Encounter (Signed)
 Requesting: Tramadol  Contract: No UDS: N/A Last Visit: 09/08/2023 Next Visit: 12/17/2023 Last Refill: 08/30/2023   Please Advise

## 2023-10-06 ENCOUNTER — Other Ambulatory Visit: Payer: Self-pay | Admitting: Urology

## 2023-10-06 DIAGNOSIS — N200 Calculus of kidney: Secondary | ICD-10-CM

## 2023-10-07 ENCOUNTER — Telehealth: Payer: Self-pay | Admitting: Urology

## 2023-10-07 NOTE — Telephone Encounter (Signed)
 Called pt reiterated the need for annual follow up for medication refills, pt scheduled.

## 2023-10-07 NOTE — Telephone Encounter (Signed)
 Patient called stating his medication refill for Tamsulosin  has been denied. Patient would like another script sent to pharmacy. I did advise patient that he has not been seen in over a year. Patient stated that there are no changes and he feels fine. Please advise patient.

## 2023-10-09 NOTE — Progress Notes (Signed)
Remote ICD Transmission.

## 2023-10-13 ENCOUNTER — Encounter: Payer: Self-pay | Admitting: Podiatry

## 2023-10-15 ENCOUNTER — Ambulatory Visit: Admitting: Urology

## 2023-10-15 VITALS — BP 115/57 | HR 72 | Ht 72.0 in | Wt 205.0 lb

## 2023-10-15 DIAGNOSIS — N401 Enlarged prostate with lower urinary tract symptoms: Secondary | ICD-10-CM | POA: Diagnosis not present

## 2023-10-15 DIAGNOSIS — N200 Calculus of kidney: Secondary | ICD-10-CM | POA: Diagnosis not present

## 2023-10-15 DIAGNOSIS — N138 Other obstructive and reflux uropathy: Secondary | ICD-10-CM

## 2023-10-15 DIAGNOSIS — N281 Cyst of kidney, acquired: Secondary | ICD-10-CM

## 2023-10-15 LAB — BLADDER SCAN AMB NON-IMAGING

## 2023-10-15 MED ORDER — TAMSULOSIN HCL 0.4 MG PO CAPS: ORAL_CAPSULE | ORAL | 3 refills | Status: AC

## 2023-10-15 NOTE — Progress Notes (Signed)
   10/15/2023 4:25 PM   [name] [name] 1940/11/01 [id number]  Reason for visit: Follow up BPH, nephrolithiasis, renal cyst, adrenal nodule  History: Long history of obstructive urinary symptoms, double voiding, on Flomax  long-term, history of elevated PVRs 200-342ml Right Bosniak 54F cyst 1.5cm, has been stable almost 10 years, has deferred further surveillance imaging Stable right sided adrenal adenoma 1.3 cm, felt to be benign, he deferred endocrine evaluation for workup Right ureteroscopy 2019  Physical Exam: BP (!) 115/57 (BP Location: Left Arm, Patient Position: Sitting, Cuff Size: Normal)   Pulse 72   Ht 6' (1.829 m)   Wt 205 lb (93 kg)   SpO2 96%   BMI 27.80 kg/m    Imaging/labs: I personally viewed and interpreted the multiple MRI, CT, and renal ultrasounds over the last few years.  Overall stable small Bosniak 54F right cyst, moderate left-sided stone burden with no hydronephrosis, stable right adrenal adenoma 1.3 cm  Today: No urologic problems over the last year, occasional double voiding or split stream but no real bothersome urinary symptoms PVR today normal at 42ml No stone episodes in the last year Defers further imaging for renal cysts, reasonable based on overall stability and his age with other comorbidities  Plan:   Renal cyst: Have been stable for most 10 years, can defer further imaging with his age and comorbidities BPH: Continue Flomax , refilled Nephrolithiasis: Stone prevention strategies discussed, return precautions reviewed RTC 1 year PVR, if doing well can likely have Flomax  filled by PCP moving forward per his preference   [name] [name] Burnet, MD  [facility] [address] [place], [place] [address] [phone]

## 2023-10-16 ENCOUNTER — Encounter: Payer: Self-pay | Admitting: Podiatry

## 2023-10-17 ENCOUNTER — Encounter: Payer: Self-pay | Admitting: Podiatry

## 2023-10-17 ENCOUNTER — Ambulatory Visit: Admitting: Podiatry

## 2023-10-17 DIAGNOSIS — B351 Tinea unguium: Secondary | ICD-10-CM

## 2023-10-17 DIAGNOSIS — M79674 Pain in right toe(s): Secondary | ICD-10-CM | POA: Diagnosis not present

## 2023-10-17 DIAGNOSIS — E1142 Type 2 diabetes mellitus with diabetic polyneuropathy: Secondary | ICD-10-CM

## 2023-10-17 DIAGNOSIS — M79675 Pain in left toe(s): Secondary | ICD-10-CM

## 2023-10-17 NOTE — Progress Notes (Signed)
 Subjective:  Patient ID: [name] [name], male    DOB: [birth year],  MRN: [id number]  [name] [name] presents to clinic today for at risk foot care with history of diabetic neuropathy and painful mycotic toenails of both feet that are difficult to trim. Pain interferes with daily activities and wearing enclosed shoe gear comfortably.   He states he developed ankle pain on inside of left foot started on last Tuesday. He does remember pain being so bad, he could hardly ambulate. He applied ace bandage and ice and states condition worsened. He had an old jar of DMSO Cream and applied it to his foot and this has helped pain subside. He would still like to be evaluated for this. Chief Complaint  Patient presents with   Toe Pain    Type 2 Diabetic foot care. A1c 7.1. Last visit with PCP-[name] M. [name] was 4 weeks ago. He had left ankle pain that he resolved with OTC therapies. States he has a ingrown nail on the right great toe.   PCP is [name], Rollene MATSU, [name].  Allergies  Allergen Reactions   Metformin  And Related Diarrhea and Other (See Comments)    Leg cramps, also   Azithromycin Other (See Comments)    Not recommended for cardiac patients- no reaction   Other Other (See Comments)    If taking antibiotics for awhile, thrush results   Percocet [Oxycodone -Acetaminophen ] Other (See Comments)    Hallucinations   Jardiance  [Empagliflozin ] Other (See Comments)    Tongue itching/ Thrush reaction    Review of Systems: Negative except as noted in the HPI.  Objective: There were no vitals filed for this visit. [name] [name] is a pleasant 83 y.o. male WD, WN in NAD. AAO x 3.  Vascular Examination: Capillary refill time immediate b/l. Vascular status intact b/l with palpable pedal pulses. Pedal hair present b/l. No pain with calf compression b/l. Skin temperature gradient WNL b/l. No cyanosis or clubbing b/l. No ischemia or gangrene noted b/l. No edema noted b/l LE.  Neurological  Examination: Sensation grossly intact b/l with 10 gram monofilament. Vibratory sensation intact b/l. Pt has subjective symptoms of neuropathy.  Dermatological Examination: Pedal skin with normal turgor, texture and tone b/l.  No open wounds. No interdigital macerations.   Toenails 1-5 b/l thick, discolored, elongated with subungual debris and pain on dorsal palpation.   No hyperkeratotic nor porokeratotic lesions present on today's visit.  Musculoskeletal Examination: Muscle strength 5/5 to all lower extremity muscle groups bilaterally. Hammertoe(s) 2-5 bilaterally. Pain on palpation to navicular tuberosity left foot. No edema, no warmth. Mild pes planus b/l.  Radiographs: None  Assessment/Plan: 1. Pain due to onychomycosis of toenails of both feet   2. Diabetic polyneuropathy associated with type 2 diabetes mellitus (HCC)     Patient was evaluated and treated. All patient's and/or POA's questions/concerns addressed on today's visit. Toenails 1-5 debrided in length and girth without incident. Continue foot and shoe inspections daily. Monitor blood glucose per PCP/Endocrinologist's recommendations. Continue soft, supportive shoe gear daily. Report any pedal injuries to medical professional. Call office if there are any questions/concerns. -Patient referred to Dr. Franky Blanch  for evaluation of left foot/ankle pain. -Patient/POA to call should there be question/concern in the interim.   Return in about 3 months (around 01/17/2024).  [name] [name], DPM      [place] LOCATION: [address]  [place], [place] [address]                   Office [phone]   [facility] LOCATION: [address] [place], [place] [address] Office [phone]

## 2023-10-21 ENCOUNTER — Encounter: Payer: Self-pay | Admitting: Pharmacist

## 2023-10-22 ENCOUNTER — Other Ambulatory Visit: Payer: Self-pay | Admitting: Family

## 2023-10-23 ENCOUNTER — Ambulatory Visit: Admitting: Podiatry

## 2023-10-23 DIAGNOSIS — M76821 Posterior tibial tendinitis, right leg: Secondary | ICD-10-CM

## 2023-10-23 NOTE — Progress Notes (Unsigned)
 Date:  10/23/2023   ID:  [name] [name], DOB 04-05-1940, MRN [id number]  Patient Location:  [address] [place] [address]   Provider location:   [facility], [place] office  PCP:  Dineen Rollene MATSU, [name]  Cardiologist:  [facility]  No chief complaint on file.   History of Present Illness:    [name] [name] is a 83 y.o. male  past medical history of CAD ,NSTEMI,  stenting was 19, and  2005 , 4/21, 6/05 May 2019 , atherectomy, drug-eluting stent  STENT RESOLUTE ONYX 4.5X18, postdilated LAD and angioplasty of diagonal VT followed by EP DM II HTN Hyperlipidemia Melanoma Abdominal aorta atherosclerosis ICD 01/2020 for EF 35% to 40% Ef in 6/23: ef 55 to 60% Ejection fraction November 20, 2022  40 to 45% Who presents for routine follow-up of his coronary artery disease, moderate aortic valve stenosis  Last seen by myself in clinic 4/25   Last seen by EP April 2024  On discussion today he reports blood pressure has been running low especially for the past 7 days Has been checking blood pressure more recently Not uncommon to have systolic pressure 90, sometimes less On 1 occasion  70s systolic, took 1/2 midodrine  If pressure stays low takes a whole midodrine  Slightly dizzy at time but in general is asymptomatic  No chest pain concerning for angina Pacer downloads reviewed, BiV pacing 100%   Remains on Ozempic , minimal weight loss over the past year  Labs reviewed A1C 6.8 LDL 64  EKG personally reviewed by myself on todays visit      Seen by EP in clinic November 22, 2022, phrenic nerve stimulation from device  cardiac catheterization November 26, 2022, chronic occlusion of RCA with collaterals, no significant change, medical management recommended  Phrenic nerve stim little better Takes neurontin  before bed, feels better  Crestor  every other day, zetia  daily  Chronic back pain, trouble walking  Recent imaging studies  reviewed Echo 6/23 Left ventricular ejection fraction, by estimation, is 55 to 60%. The  left ventricle has normal function. The left ventricle has no regional  wall motion abnormalities. Left ventricular diastolic parameters are  consistent with Grade I diastolic  dysfunction (impaired relaxation).   2. Right ventricular systolic function is normal. The right ventricular  size is normal.   3. The mitral valve is normal in structure. No evidence of mitral valve  regurgitation.   4. The aortic valve is tricuspid. Aortic valve regurgitation is not  visualized. Mild to moderate aortic valve stenosis. Aortic valve mean  gradient measures 18.0 mmHg.  CT ABD/pelvis 07/11/2020 Mild diffuse aortic athero Small pericardial effusion, anterior  Cath 06/2019:  Catheterization chronically occluded RCA collaterals from left to right, Severe proximal LAD disease 80%, stent placed. Underwent angioplasty of diagonal vessel, 75% lesion Patent stent in the ramus  Echo 12/2019 Left ventricular ejection fraction, by estimation, is 35 to 40%.\ ICD implant 02/02/2020  RV lead thresholds ahd risen significantly, was decided to pursue lead revision with suspect microdislodgement.  Echocardiogram December 2021 Ejection fraction 35 to 40%  Dr. Cindie noted his eliquis  2/2 LV thrombus that had since resolved and discontinued it.  Non-STEMI June 2021: Hospital Catheterization chronically occluded RCA collaterals from left to right Severe proximal LAD disease 80%, stent placed Angioplasty of diagonal vessel, 75% lesion  ZIO monitor with nonsustained VT, PVCs 10% burden  Cardiac MRI Ejection fraction 35%  LGE in the basal and mid inferior and inferior  lateral walls, dyskinesis of the basal and inferior inferolateral walls and a left ventricular mural thrombus located on the inferolateral wall.   Past Medical History:  Diagnosis Date   Aortic insufficiency    a. noted on TTE 2015; b. 06/2019 Echo: AI  not visualized.   Arthritis    CAD (coronary artery disease)    a. remote PCI in 1991 and 2005; b. MV 3/15: old inferior MI, no ischemia, LVEF 50%, slight inferior wall hypokniesis; c. 06/2019 NSTEMI/PCI: LM nl, LAD 80p/m (Atherectomy & 4.5x18 Resolute Onyx DES), 47m/d, D1 75 (PTCA), RI patent stent, LCX nl, RCA 100p, RPAV fills via L->R collats from LCX.   Chicken pox    Colon polyps    4 pre-cancerous    Diverticulitis    DM type 2 (diabetes mellitus, type 2) (HCC)    Family history of adverse reaction to anesthesia    GERD (gastroesophageal reflux disease)    Heart murmur    History of kidney stones    HOH (hard of hearing)    Hyperlipidemia    Hypertension    Ischemic cardiomyopathy    a. TTE 2015: EF  50-55%, mild global HK; b. 06/2019 Echo: EF 45-50%, Gr1 DD, basal inf AK. Triv MR.   Kidney stones    Melanoma (HCC) 1980   Resected from his back   Mitral regurgitation    a. noted on TTE 2015   Myocardial infarction [facility])    Past Surgical History:  Procedure Laterality Date   BIV UPGRADE N/A 02/26/2021   Procedure: BIV ICD UPGRADE;  Surgeon: Cindie Ole DASEN, MD;  Location: [facility] INVASIVE CV LAB;  Service: Cardiovascular;  Laterality: N/A;   CHOLECYSTECTOMY  2012   COLONOSCOPY     in 2003 with polyp removed and leak anastomosis had to have open abdominal surgery    CORONARY ANGIOPLASTY WITH STENT PLACEMENT  1991 & 2005   CORONARY ATHERECTOMY N/A 07/08/2019   Procedure: CORONARY ATHERECTOMY;  Surgeon: Dann Candyce RAMAN, MD;  Location: [facility] INVASIVE CV LAB;  Service: Cardiovascular;  Laterality: N/A;   CORONARY BALLOON ANGIOPLASTY N/A 07/08/2019   Procedure: CORONARY BALLOON ANGIOPLASTY;  Surgeon: Dann Candyce RAMAN, MD;  Location: [facility] INVASIVE CV LAB;  Service: Cardiovascular;  Laterality: N/A;  diagonal    CORONARY PRESSURE/FFR STUDY N/A 07/07/2019   Procedure: INTRAVASCULAR PRESSURE WIRE/FFR STUDY;  Surgeon: Mady Bruckner, MD;  Location: [facility] INVASIVE CV LAB;  Service:  Cardiovascular;  Laterality: N/A;   CORONARY STENT INTERVENTION N/A 07/08/2019   Procedure: CORONARY STENT INTERVENTION;  Surgeon: Dann Candyce RAMAN, MD;  Location: [facility] INVASIVE CV LAB;  Service: Cardiovascular;  Laterality: N/A;  lad   CORONARY ULTRASOUND/IVUS N/A 07/08/2019   Procedure: Intravascular Ultrasound/IVUS;  Surgeon: Dann Candyce RAMAN, MD;  Location: [facility] INVASIVE CV LAB;  Service: Cardiovascular;  Laterality: N/A;   CYSTOSCOPY/URETEROSCOPY/HOLMIUM LASER/STENT PLACEMENT Right 01/02/2018   Procedure: CYSTOSCOPY/URETEROSCOPY/HOLMIUM LASER/STENT PLACEMENT;  Surgeon: Francisca Redell BROCKS, MD;  Location: [facility] ORS;  Service: Urology;  Laterality: Right;   ESOPHAGOGASTRODUODENOSCOPY (EGD) WITH PROPOFOL  N/A 11/19/2016   Procedure: ESOPHAGOGASTRODUODENOSCOPY (EGD) WITH PROPOFOL ;  Surgeon: Jinny Carmine, MD;  Location: [facility] ENDOSCOPY;  Service: Endoscopy;  Laterality: N/A;   ESOPHAGOGASTRODUODENOSCOPY (EGD) WITH PROPOFOL  N/A 02/02/2019   Procedure: ESOPHAGOGASTRODUODENOSCOPY (EGD) WITH PROPOFOL ;  Surgeon: Jinny Carmine, MD;  Location: [facility] ENDOSCOPY;  Service: Endoscopy;  Laterality: N/A;   ICD IMPLANT N/A 02/02/2020   Procedure: ICD IMPLANT;  Surgeon: Cindie Ole DASEN, MD;  Location: [facility] INVASIVE CV LAB;  Service: Cardiovascular;  Laterality:  N/A;   LEAD REVISION/REPAIR N/A 02/16/2020   Procedure: LEAD REVISION/REPAIR;  Surgeon: Inocencio Soyla Lunger, MD;  Location: [facility] INVASIVE CV LAB;  Service: Cardiovascular;  Laterality: N/A;   LEFT HEART CATH N/A 07/08/2019   Procedure: Left Heart Cath;  Surgeon: Dann Candyce RAMAN, MD;  Location: [facility] INVASIVE CV LAB;  Service: Cardiovascular;  Laterality: N/A;   LEFT HEART CATH AND CORONARY ANGIOGRAPHY N/A 07/07/2019   Procedure: LEFT HEART CATH AND CORONARY ANGIOGRAPHY;  Surgeon: Mady Bruckner, MD;  Location: [facility] INVASIVE CV LAB;  Service: Cardiovascular;  Laterality: N/A;   LITHOTRIPSY  2015   MELANOMA EXCISION  1980   malignant   NERVE SURGERY   2015   ulna nerve   pre cancer lesion from scalp  09/2022   RIGHT HEART CATH AND CORONARY ANGIOGRAPHY Bilateral 11/26/2022   Procedure: RIGHT HEART CATH AND CORONARY ANGIOGRAPHY;  Surgeon: Mady Bruckner, MD;  Location: [facility] INVASIVE CV LAB;  Service: Cardiovascular;  Laterality: Bilateral;   TONSILLECTOMY  1945   WISDOM TOOTH EXTRACTION       No outpatient medications have been marked as taking for the 10/24/23 encounter (Appointment) with Vicenta Olds J, MD.     Allergies:   Metformin  and related, Azithromycin, Other, Percocet [oxycodone -acetaminophen ], and Jardiance  [empagliflozin ]   Social History   Tobacco Use   Smoking status: Some Days    Types: Pipe    Passive exposure: Current   Smokeless tobacco: Former    Quit date: 10/17/2015  Vaping Use   Vaping status: Never Used  Substance Use Topics   Alcohol  use: Yes    Alcohol /week: 0.0 - 1.0 standard drinks of alcohol     Comment: OCCASIONALLY   Drug use: No     Family Hx: The patient's family history includes Colon cancer in his father; Diabetes in his father and mother; Heart attack in his mother; Heart disease in his mother; Hyperlipidemia in his mother; Hypertension in his mother; Kidney cancer in his father; Liver cancer in his father; Lung cancer in his father. There is no history of Bladder Cancer or Prostate cancer.  ROS:   Please see the history of present illness.    Review of Systems  Constitutional: Negative.   HENT: Negative.    Respiratory: Negative.    Cardiovascular: Negative.   Gastrointestinal: Negative.   Neurological: Negative.   Psychiatric/Behavioral: Negative.    All other systems reviewed and are negative.   Labs/Other Tests and Data Reviewed:    Recent Labs: 11/22/2022: BUN 22; Creatinine, Ser 1.04; Platelets 163; TSH 4.790 11/26/2022: Hemoglobin 12.9; Potassium 4.0; Sodium 139   Recent Lipid Panel Lab Results  Component Value Date/Time   CHOL 118 06/19/2022 10:09 AM   TRIG 212.0  (H) 06/19/2022 10:09 AM   HDL 39.90 06/19/2022 10:09 AM   CHOLHDL 3 06/19/2022 10:09 AM   LDLCALC 35 12/21/2019 09:19 AM   LDLDIRECT 64.0 06/19/2022 10:09 AM    Wt Readings from Last 3 Encounters:  10/15/23 205 lb (93 kg)  09/08/23 206 lb 6.4 oz (93.6 kg)  09/03/23 198 lb (89.8 kg)     Exam:    There were no vitals taken for this visit. Constitutional:  oriented to person, place, and time. No distress.  HENT:  Head: Grossly normal Eyes:  no discharge. No scleral icterus.  Neck: No JVD, no carotid bruits  Cardiovascular: Regular rate and rhythm, no murmurs appreciated Pulmonary/Chest: Clear to auscultation bilaterally, no wheezes or rales Abdominal: Soft.  no distension.  no tenderness.  Musculoskeletal: Normal range of motion Neurological:  normal muscle tone. Coordination normal. No atrophy Skin: Skin warm and dry Psychiatric: normal affect, pleasant , ASSESSMENT & PLAN:    Orthostatic hypotension Recommend he move morning lisinopril  to the evening Suggest he take morning and noon medications after food, increase hydration midodrine  as needed for systolic pressure less than 100 - If blood pressure continues to run low we may need to decrease dosing of spironolactone  down to 12.5, lisinopril  down to 5 - Also on Flomax  which can drop blood pressure - Avoid large hot meals which could drop pressure postprandial  Sustained VT Monitored through ICD downloads, on carvedilol , amiodarone  Managed by EP, reports no significant near-syncope or syncope symptoms  Atherosclerosis of native coronary artery with stable angina pectoris, unspecified whether native or transplanted heart [facility]) cardiomyopathy ejection fraction 35 to 40% December 2022 Normalized ejection fraction June 2023 on echo Drop in ejection fraction back to 40% November 2024 catheterization results reviewed occluded RCA with collaterals left-to-right Continue carvedilol  spironolactone  and lisinopril  Close  monitoring of blood pressure -Given hypotension, unable to transition to Entresto or add SGLT2 inhibitor  Diarrhea:  Periods of constipation  cardiomyopathy, ischemic For now we will continue Coreg , lisinopril , spironolactone  EF 35 to 40% appear to have normalized, back to 40% on recent echo No medication changes given symptomatic hypotension  Aortic valve stenosis Moderate stenosis on recent echo November 2024 Annual echo  Nonsustained VT, PVCs Has  ICD, followed EP Lead revision successful sustained VT requiring antitachycardia pacing In the past with diaphragmatic stimulation, takes Neurontin  with improved symptoms  Atherosclerosis of abdominal aorta (HCC)  mild disease, no claudication sx Cholesterol at goal  Type 2 diabetes mellitus with other circulatory complication, without long-term current use of insulin  (HCC) Stable, minimal trend down in the weight over the past several years, only several pounds Remains on Ozempic   Carotid artery disease, unspecified laterality (HCC) U/s in 2017 Minor carotid atherosclerosis.   Preop cardiovascular evaluation for cortisone injection Acceptable risk to hold Plavix  7 days prior to lumbar injection Stay on aspirin  No further cardiac workup needed in preparation   Signed, Evalene Lunger, MD  10/23/2023 9:00 PM    [facility] [address], [place], [place] [address]

## 2023-10-23 NOTE — Progress Notes (Signed)
 Subjective:  Patient ID: [name] [name], male    DOB: [birth year],  MRN: [id number]  Chief Complaint  Patient presents with   Foot Pain    Left foot pain     83 y.o. male presents with the above complaint.  Patient presents for right posterior tibial tendinitis pain.  He wanted to get it evaluated he states the pain came out of nowhere has not seen and was prior to seeing me for this.  He is a type II diabetic he states now the pain is doing much better it really flared up a couple of weeks ago but since then has calmed down.  Pain scale now is 0 out of 10.  He wanted to discuss treatment plans.  He has not seen anyone as prior to seeing me for this.   Review of Systems: Negative except as noted in the HPI. Denies N/V/F/Ch.  Past Medical History:  Diagnosis Date   Aortic insufficiency    a. noted on TTE 2015; b. 06/2019 Echo: AI not visualized.   Arthritis    CAD (coronary artery disease)    a. remote PCI in 1991 and 2005; b. MV 3/15: old inferior MI, no ischemia, LVEF 50%, slight inferior wall hypokniesis; c. 06/2019 NSTEMI/PCI: LM nl, LAD 80p/m (Atherectomy & 4.5x18 Resolute Onyx DES), 19m/d, D1 75 (PTCA), RI patent stent, LCX nl, RCA 100p, RPAV fills via L->R collats from LCX.   Chicken pox    Colon polyps    4 pre-cancerous    Diverticulitis    DM type 2 (diabetes mellitus, type 2) (HCC)    Family history of adverse reaction to anesthesia    GERD (gastroesophageal reflux disease)    Heart murmur    History of kidney stones    HOH (hard of hearing)    Hyperlipidemia    Hypertension    Ischemic cardiomyopathy    a. TTE 2015: EF  50-55%, mild global HK; b. 06/2019 Echo: EF 45-50%, Gr1 DD, basal inf AK. Triv MR.   Kidney stones    Melanoma (HCC) 1980   Resected from his back   Mitral regurgitation    a. noted on TTE 2015   Myocardial infarction [facility])     Current Outpatient Medications:    amiodarone  (PACERONE ) 200 MG tablet, Take 1 tablet (200 mg total) by mouth daily.,  Disp: 90 tablet, Rfl: 3   aspirin  EC 81 MG tablet, Take 1 tablet (81 mg total) by mouth daily. Swallow whole., Disp: 90 tablet, Rfl: 3   azelastine  (ASTELIN ) 0.1 % nasal spray, Place 1 spray into both nostrils 2 (two) times daily. Use in each nostril as directed, Disp: 30 mL, Rfl: 6   bisacodyl (DULCOLAX) 5 MG EC tablet, Take 5 mg by mouth daily as needed for moderate constipation., Disp: , Rfl:    carvedilol  (COREG ) 3.125 MG tablet, Take 1 tablet (3.125 mg total) by mouth 2 (two) times daily with a meal., Disp: 180 tablet, Rfl: 3   clopidogrel  (PLAVIX ) 75 MG tablet, TAKE 1 TABLET(75 MG) BY MOUTH DAILY, Disp: 30 tablet, Rfl: 11   cyclobenzaprine  (FLEXERIL ) 5 MG tablet, Take 5 mg by mouth as needed for muscle spasms., Disp: , Rfl:    dextromethorphan-guaiFENesin (MUCINEX DM) 30-600 MG 12hr tablet, Take 1 tablet by mouth daily as needed (Sinus Congestion)., Disp: , Rfl:    ezetimibe  (ZETIA ) 10 MG tablet, TAKE 1 TABLET(10 MG) BY MOUTH DAILY, Disp: 90 tablet, Rfl: 3   famotidine (PEPCID) 20 MG tablet, Take  20 mg by mouth daily with lunch., Disp: , Rfl:    gabapentin  (NEURONTIN ) 100 MG capsule, Take 1 capsule (100 mg total) by mouth at bedtime., Disp: , Rfl:    glipiZIDE  (GLUCOTROL ) 10 MG tablet, TAKE 1 TABLET BY MOUTH EVERY MORNING WITH BREAKFAST AND 1 TABLET EVERY EVENING WITH SUPPER., Disp: 120 tablet, Rfl: 3   Glucosamine 500 MG CAPS, Take 1,500 mg by mouth daily as needed., Disp: , Rfl:    glucose blood test strip, Use as instructed, Disp: 100 each, Rfl: 12   Glycerin-Hypromellose-PEG 400 (DRY EYE RELIEF DROPS) 0.2-0.2-1 % SOLN, Place 1 drop into both eyes daily., Disp: , Rfl:    Lancets (ONETOUCH ULTRASOFT) lancets, Use as instructed, Disp: 100 each, Rfl: 12   lisinopril  (ZESTRIL ) 5 MG tablet, Take 1 tablet (5 mg total) by mouth daily., Disp: 90 tablet, Rfl: 3   midodrine  (PROAMATINE ) 5 MG tablet, Take 1 tablet (5 mg total) by mouth 3 (three) times daily as needed. Take when BP does drop less than  110 when standing. Additional dose at 2 pm if still dropping below 110 when standing, Disp: 90 tablet, Rfl: 3   Multiple Vitamin (MULTIVITAMIN) tablet, Take 1 tablet by mouth daily with lunch., Disp: , Rfl:    Omega-3 Fatty Acids (FISH OIL PO), Take 1,500 mg by mouth daily with lunch., Disp: , Rfl:    pantoprazole  (PROTONIX ) 40 MG tablet, Take 1 tablet (40 mg total) by mouth daily., Disp: 90 tablet, Rfl: 3   Psyllium 83 % POWD, Take 15 mLs by mouth at bedtime., Disp: , Rfl:    rosuvastatin  (CRESTOR ) 10 MG tablet, Take 1 tablet (10 mg total) by mouth every other day., Disp: 45 tablet, Rfl: 1   Semaglutide , 1 MG/DOSE, 4 MG/3ML SOPN, Inject 1 mg as directed once a week., Disp: 3 mL, Rfl: 3   sodium chloride  (OCEAN) 0.65 % SOLN nasal spray, Place 1 spray into both nostrils daily as needed for congestion (Sinus)., Disp: , Rfl:    spironolactone  (ALDACTONE ) 25 MG tablet, Take 0.5 tablets (12.5 mg total) by mouth daily. TAKE 1 TABLET(25 MG) BY MOUTH DAILY, Disp: 45 tablet, Rfl: 3   tamsulosin  (FLOMAX ) 0.4 MG CAPS capsule, TAKE ONE CAPSULE BY MOUTH DAILY, Disp: 90 capsule, Rfl: 3   traMADol  (ULTRAM ) 50 MG tablet, TAKE 1 TABLET BY MOUTH EVERY 12 HOURS AS NEEDED, Disp: 60 tablet, Rfl: 2  Social History   Tobacco Use  Smoking Status Some Days   Types: Pipe   Passive exposure: Current  Smokeless Tobacco Former   Quit date: 10/17/2015    Allergies  Allergen Reactions   Metformin  And Related Diarrhea and Other (See Comments)    Leg cramps, also   Azithromycin Other (See Comments)    Not recommended for cardiac patients- no reaction   Other Other (See Comments)    If taking antibiotics for awhile, thrush results   Percocet [Oxycodone -Acetaminophen ] Other (See Comments)    Hallucinations   Jardiance  Dido.Dense ] Other (See Comments)    Tongue itching/ Thrush reaction   Objective:  There were no vitals filed for this visit. There is no height or weight on file to calculate BMI. Constitutional  Well developed. Well nourished.  Vascular Dorsalis pedis pulses palpable bilaterally. Posterior tibial pulses palpable bilaterally. Capillary refill normal to all digits.  No cyanosis or clubbing noted. Pedal hair growth normal.  Neurologic Normal speech. Oriented to person, place, and time. Epicritic sensation to light touch grossly present bilaterally.  Dermatologic  Nails well groomed and normal in appearance. No open wounds. No skin lesions.  Orthopedic: Right posterior tibial tendinitis pain along the course of the tendon pain at the insertion pain with resisted dorsiflexion eversion of the foot mild.  No pain at the Achilles tendon peroneal tendon ATFL ligament   Radiographs: None Assessment:   1. Posterior tibial tendinitis of right lower extremity    Plan:  Patient was evaluated and treated and all questions answered.  Right posterior tibial tendinitis - All questions and concerns were discussed with the patient in extensive detail given the amount of pain that he was experiencing it was likely due to the irritation of the tendinitis at this time it has completely resolved I discussed shoe gear modification orthotics management extensive detail he states understanding if it flares back up again he will come back and see me.  No follow-ups on file.

## 2023-10-24 ENCOUNTER — Ambulatory Visit: Attending: Cardiovascular Disease | Admitting: Cardiovascular Disease

## 2023-10-24 ENCOUNTER — Encounter: Payer: Self-pay | Admitting: Cardiovascular Disease

## 2023-10-24 ENCOUNTER — Other Ambulatory Visit: Payer: Self-pay

## 2023-10-24 VITALS — BP 94/58 | HR 55 | Ht 74.0 in | Wt 201.6 lb

## 2023-10-24 DIAGNOSIS — I10 Essential (primary) hypertension: Secondary | ICD-10-CM

## 2023-10-24 DIAGNOSIS — I472 Ventricular tachycardia, unspecified: Secondary | ICD-10-CM

## 2023-10-24 DIAGNOSIS — I442 Atrioventricular block, complete: Secondary | ICD-10-CM | POA: Diagnosis not present

## 2023-10-24 DIAGNOSIS — I2510 Atherosclerotic heart disease of native coronary artery without angina pectoris: Secondary | ICD-10-CM | POA: Diagnosis not present

## 2023-10-24 DIAGNOSIS — I739 Peripheral vascular disease, unspecified: Secondary | ICD-10-CM

## 2023-10-24 DIAGNOSIS — I5022 Chronic systolic (congestive) heart failure: Secondary | ICD-10-CM | POA: Diagnosis not present

## 2023-10-24 DIAGNOSIS — I779 Disorder of arteries and arterioles, unspecified: Secondary | ICD-10-CM

## 2023-10-24 DIAGNOSIS — I255 Ischemic cardiomyopathy: Secondary | ICD-10-CM

## 2023-10-24 DIAGNOSIS — I429 Cardiomyopathy, unspecified: Secondary | ICD-10-CM

## 2023-10-24 DIAGNOSIS — E782 Mixed hyperlipidemia: Secondary | ICD-10-CM

## 2023-10-24 MED ORDER — SPIRONOLACTONE 25 MG PO TABS: 12.5000 mg | ORAL_TABLET | Freq: Every day | ORAL | 3 refills | Status: DC

## 2023-10-24 NOTE — Patient Instructions (Addendum)
 Monitor blood pressure For low numbers, we may need to decrease or change carvedilol   Medication Instructions:   Please decrease the spirolactone down to 12.5 mg daily Decrease the lisinopril  down to 5 mg daily  If you need a refill on your cardiac medications before your next appointment, please call your pharmacy.   Lab work: No new labs needed  Testing/Procedures: No new testing needed  Follow-Up: At [facility], you and your health needs are our priority.  As part of our continuing mission to provide you with exceptional heart care, we have created designated Provider Care Teams.  These Care Teams include your primary Cardiologist (physician) and Advanced Practice Providers (APPs -  Physician Assistants and Nurse Practitioners) who all work together to provide you with the care you need, when you need it.  You will need a follow up appointment in 6 months  Providers on your designated Care Team:   Lonni Meager, NP Bernardino Bring, PA-C Cadence [place], [place]  COVID-19 Vaccine Information can be found at: [email or web address] For questions related to vaccine distribution or appointments, please email [email or web address] or call [phone].

## 2023-10-27 ENCOUNTER — Ambulatory Visit: Payer: Medicare Other | Admitting: *Deleted

## 2023-10-27 VITALS — BP 117/62 | HR 52 | Ht 72.0 in | Wt 198.0 lb

## 2023-10-27 DIAGNOSIS — Z0000 Encounter for general adult medical examination without abnormal findings: Secondary | ICD-10-CM

## 2023-10-27 NOTE — Progress Notes (Signed)
 Subjective:   [name] [name] is a 83 y.o. who presents for a Medicare Wellness preventive visit.  As a reminder, Annual Wellness Visits don't include a physical exam, and some assessments may be limited, especially if this visit is performed virtually. We may recommend an in-person follow-up visit with your provider if needed.  Visit Complete: Virtual I connected with  [name] [name] on 10/27/23 by a audio enabled telemedicine application and verified that I am speaking with the correct person using two identifiers.  Patient Location: Home  Provider Location: Home Office  I discussed the limitations of evaluation and management by telemedicine. The patient expressed understanding and agreed to proceed.  Vital Signs: Because this visit was a virtual/telehealth visit, some criteria may be missing or patient reported. Any vitals not documented were not able to be obtained and vitals that have been documented are patient reported.  VideoDeclined- This patient declined Librarian, academic. Therefore the visit was completed with audio only.  Persons Participating in Visit: Patient.  AWV Questionnaire: Yes: Patient Medicare AWV questionnaire was completed by the patient on 10/27/23; I have confirmed that all information answered by patient is correct and no changes since this date.  Cardiac Risk Factors include: advanced age (>50men, >72 women);male gender;diabetes mellitus;dyslipidemia;hypertension;smoking/ tobacco exposure     Objective:    Today's Vitals   10/27/23 0855 10/27/23 0857  BP: 117/62   Pulse: (!) 52   Weight: 198 lb (89.8 kg)   Height: 6' (1.829 m)   PainSc:  3    Body mass index is 26.85 kg/m.     10/27/2023    9:13 AM 09/03/2023    8:08 AM 08/19/2023   11:25 AM 07/21/2023    8:56 AM 06/17/2023    2:10 PM 05/01/2023    9:23 AM 11/26/2022    9:05 AM  Advanced Directives  Does Patient Have a Medical Advance Directive? Yes Yes Yes Yes  Yes Yes Yes  Type of Estate agent of [place];Living will      Healthcare Power of [place];Living will  Does patient want to make changes to medical advance directive? No - Patient declined     No - Patient declined   Copy of Healthcare Power of Attorney in Chart? Yes - validated most recent copy scanned in chart (See row information)          Current Medications (verified) Outpatient Encounter Medications as of 10/27/2023  Medication Sig   amiodarone  (PACERONE ) 200 MG tablet Take 1 tablet (200 mg total) by mouth daily.   aspirin  EC 81 MG tablet Take 1 tablet (81 mg total) by mouth daily. Swallow whole.   azelastine  (ASTELIN ) 0.1 % nasal spray Place 1 spray into both nostrils 2 (two) times daily. Use in each nostril as directed   bisacodyl (DULCOLAX) 5 MG EC tablet Take 5 mg by mouth daily as needed for moderate constipation.   carvedilol  (COREG ) 3.125 MG tablet Take 1 tablet (3.125 mg total) by mouth 2 (two) times daily with a meal.   clopidogrel  (PLAVIX ) 75 MG tablet TAKE 1 TABLET(75 MG) BY MOUTH DAILY   cyclobenzaprine  (FLEXERIL ) 5 MG tablet Take 5 mg by mouth as needed for muscle spasms.   dextromethorphan-guaiFENesin (MUCINEX DM) 30-600 MG 12hr tablet Take 1 tablet by mouth daily as needed (Sinus Congestion).   ezetimibe  (ZETIA ) 10 MG tablet TAKE 1 TABLET(10 MG) BY MOUTH DAILY   famotidine (PEPCID) 20 MG tablet Take 20 mg by mouth daily with lunch.  gabapentin  (NEURONTIN ) 100 MG capsule Take 1 capsule (100 mg total) by mouth at bedtime.   glipiZIDE  (GLUCOTROL ) 10 MG tablet TAKE 1 TABLET BY MOUTH EVERY MORNING WITH BREAKFAST AND 1 TABLET EVERY EVENING WITH SUPPER.   Glucosamine 500 MG CAPS Take 1,500 mg by mouth daily as needed.   glucose blood test strip Use as instructed   Glycerin-Hypromellose-PEG 400 (DRY EYE RELIEF DROPS) 0.2-0.2-1 % SOLN Place 1 drop into both eyes daily.   Lancets (ONETOUCH ULTRASOFT) lancets Use as instructed   lisinopril  (ZESTRIL ) 5 MG  tablet Take 1 tablet (5 mg total) by mouth daily.   midodrine  (PROAMATINE ) 5 MG tablet Take 1 tablet (5 mg total) by mouth 3 (three) times daily as needed. Take when BP does drop less than 110 when standing. Additional dose at 2 pm if still dropping below 110 when standing   Multiple Vitamin (MULTIVITAMIN) tablet Take 1 tablet by mouth daily with lunch.   Omega-3 Fatty Acids (FISH OIL PO) Take 1,500 mg by mouth daily with lunch.   pantoprazole  (PROTONIX ) 40 MG tablet Take 1 tablet (40 mg total) by mouth daily.   Psyllium 83 % POWD Take 15 mLs by mouth at bedtime.   rosuvastatin  (CRESTOR ) 10 MG tablet Take 1 tablet (10 mg total) by mouth every other day.   Semaglutide , 1 MG/DOSE, 4 MG/3ML SOPN Inject 1 mg as directed once a week.   sodium chloride  (OCEAN) 0.65 % SOLN nasal spray Place 1 spray into both nostrils daily as needed for congestion (Sinus).   spironolactone  (ALDACTONE ) 25 MG tablet Take 0.5 tablets (12.5 mg total) by mouth daily. TAKE 1 TABLET(25 MG) BY MOUTH DAILY   tamsulosin  (FLOMAX ) 0.4 MG CAPS capsule TAKE ONE CAPSULE BY MOUTH DAILY   traMADol  (ULTRAM ) 50 MG tablet TAKE 1 TABLET BY MOUTH EVERY 12 HOURS AS NEEDED   No facility-administered encounter medications on file as of 10/27/2023.    Allergies (verified) Metformin  and related, Azithromycin, Other, Percocet [oxycodone -acetaminophen ], and Jardiance  [empagliflozin ]   History: Past Medical History:  Diagnosis Date   Aortic insufficiency    a. noted on TTE 2015; b. 06/2019 Echo: AI not visualized.   Arthritis    CAD (coronary artery disease)    a. remote PCI in 1991 and 2005; b. MV 3/15: old inferior MI, no ischemia, LVEF 50%, slight inferior wall hypokniesis; c. 06/2019 NSTEMI/PCI: LM nl, LAD 80p/m (Atherectomy & 4.5x18 Resolute Onyx DES), 56m/d, D1 75 (PTCA), RI patent stent, LCX nl, RCA 100p, RPAV fills via L->R collats from LCX.   Chicken pox    Colon polyps    4 pre-cancerous    Diverticulitis    DM type 2 (diabetes  mellitus, type 2) (HCC)    Family history of adverse reaction to anesthesia    GERD (gastroesophageal reflux disease)    Heart murmur    History of kidney stones    HOH (hard of hearing)    Hyperlipidemia    Hypertension    Ischemic cardiomyopathy    a. TTE 2015: EF  50-55%, mild global HK; b. 06/2019 Echo: EF 45-50%, Gr1 DD, basal inf AK. Triv MR.   Kidney stones    Melanoma (HCC) 1980   Resected from his back   Mitral regurgitation    a. noted on TTE 2015   Myocardial infarction [facility])    Past Surgical History:  Procedure Laterality Date   BIV UPGRADE N/A 02/26/2021   Procedure: BIV ICD UPGRADE;  Surgeon: Cindie Ole DASEN, MD;  Location: [facility] INVASIVE CV LAB;  Service: Cardiovascular;  Laterality: N/A;   CHOLECYSTECTOMY  2012   COLONOSCOPY     in 2003 with polyp removed and leak anastomosis had to have open abdominal surgery    CORONARY ANGIOPLASTY WITH STENT PLACEMENT  1991 & 2005   CORONARY ATHERECTOMY N/A 07/08/2019   Procedure: CORONARY ATHERECTOMY;  Surgeon: Dann Candyce RAMAN, MD;  Location: [facility] INVASIVE CV LAB;  Service: Cardiovascular;  Laterality: N/A;   CORONARY BALLOON ANGIOPLASTY N/A 07/08/2019   Procedure: CORONARY BALLOON ANGIOPLASTY;  Surgeon: Dann Candyce RAMAN, MD;  Location: [facility] INVASIVE CV LAB;  Service: Cardiovascular;  Laterality: N/A;  diagonal    CORONARY PRESSURE/FFR STUDY N/A 07/07/2019   Procedure: INTRAVASCULAR PRESSURE WIRE/FFR STUDY;  Surgeon: Mady Bruckner, MD;  Location: [facility] INVASIVE CV LAB;  Service: Cardiovascular;  Laterality: N/A;   CORONARY STENT INTERVENTION N/A 07/08/2019   Procedure: CORONARY STENT INTERVENTION;  Surgeon: Dann Candyce RAMAN, MD;  Location: [facility] INVASIVE CV LAB;  Service: Cardiovascular;  Laterality: N/A;  lad   CORONARY ULTRASOUND/IVUS N/A 07/08/2019   Procedure: Intravascular Ultrasound/IVUS;  Surgeon: Dann Candyce RAMAN, MD;  Location: [facility] INVASIVE CV LAB;  Service: Cardiovascular;  Laterality: N/A;    CYSTOSCOPY/URETEROSCOPY/HOLMIUM LASER/STENT PLACEMENT Right 01/02/2018   Procedure: CYSTOSCOPY/URETEROSCOPY/HOLMIUM LASER/STENT PLACEMENT;  Surgeon: Francisca Redell BROCKS, MD;  Location: [facility] ORS;  Service: Urology;  Laterality: Right;   ESOPHAGOGASTRODUODENOSCOPY (EGD) WITH PROPOFOL  N/A 11/19/2016   Procedure: ESOPHAGOGASTRODUODENOSCOPY (EGD) WITH PROPOFOL ;  Surgeon: Jinny Carmine, MD;  Location: [facility] ENDOSCOPY;  Service: Endoscopy;  Laterality: N/A;   ESOPHAGOGASTRODUODENOSCOPY (EGD) WITH PROPOFOL  N/A 02/02/2019   Procedure: ESOPHAGOGASTRODUODENOSCOPY (EGD) WITH PROPOFOL ;  Surgeon: Jinny Carmine, MD;  Location: [facility] ENDOSCOPY;  Service: Endoscopy;  Laterality: N/A;   ICD IMPLANT N/A 02/02/2020   Procedure: ICD IMPLANT;  Surgeon: Cindie Ole DASEN, MD;  Location: [facility] INVASIVE CV LAB;  Service: Cardiovascular;  Laterality: N/A;   LEAD REVISION/REPAIR N/A 02/16/2020   Procedure: LEAD REVISION/REPAIR;  Surgeon: Inocencio Soyla Lunger, MD;  Location: [facility] INVASIVE CV LAB;  Service: Cardiovascular;  Laterality: N/A;   LEFT HEART CATH N/A 07/08/2019   Procedure: Left Heart Cath;  Surgeon: Dann Candyce RAMAN, MD;  Location: [facility] INVASIVE CV LAB;  Service: Cardiovascular;  Laterality: N/A;   LEFT HEART CATH AND CORONARY ANGIOGRAPHY N/A 07/07/2019   Procedure: LEFT HEART CATH AND CORONARY ANGIOGRAPHY;  Surgeon: Mady Bruckner, MD;  Location: [facility] INVASIVE CV LAB;  Service: Cardiovascular;  Laterality: N/A;   LITHOTRIPSY  2015   MELANOMA EXCISION  1980   malignant   NERVE SURGERY  2015   ulna nerve   pre cancer lesion from scalp  09/2022   RIGHT HEART CATH AND CORONARY ANGIOGRAPHY Bilateral 11/26/2022   Procedure: RIGHT HEART CATH AND CORONARY ANGIOGRAPHY;  Surgeon: Mady Bruckner, MD;  Location: [facility] INVASIVE CV LAB;  Service: Cardiovascular;  Laterality: Bilateral;   TONSILLECTOMY  1945   WISDOM TOOTH EXTRACTION     Family History  Problem Relation Age of Onset   Hyperlipidemia Mother    Hypertension  Mother    Heart disease Mother    Diabetes Mother    Heart attack Mother    Colon cancer Father        metasized to liver, adrenal, lungs   Lung cancer Father    Kidney cancer Father        malignant capsulated kidney tumor   Diabetes Father    Liver cancer Father    Bladder Cancer Neg Hx    Prostate cancer  Neg Hx    Social History   Socioeconomic History   Marital status: Married    Spouse name: Dayla   Number of children: Not on file   Years of education: Not on file   Highest education level: Master's degree (e.g., MA, MS, MEng, MEd, MSW, MBA)  Occupational History   Not on file  Tobacco Use   Smoking status: Some Days    Types: Pipe    Passive exposure: Current   Smokeless tobacco: Former    Quit date: 10/17/2015  Vaping Use   Vaping status: Never Used  Substance and Sexual Activity   Alcohol  use: Yes    Alcohol /week: 0.0 - 1.0 standard drinks of alcohol     Comment: OCCASIONALLY   Drug use: No   Sexual activity: Not on file  Other Topics Concern   Not on file  Social History Narrative   Married    Social Drivers of Health   Financial Resource Strain: Low Risk  (10/27/2023)   Overall Financial Resource Strain (CARDIA)    Difficulty of Paying Living Expenses: Not very hard  Food Insecurity: No Food Insecurity (10/27/2023)   Hunger Vital Sign    Worried About Running Out of Food in the Last Year: Never true    Ran Out of Food in the Last Year: Never true  Transportation Needs: No Transportation Needs (10/27/2023)   PRAPARE - Administrator, Civil Service (Medical): No    Lack of Transportation (Non-Medical): No  Physical Activity: Insufficiently Active (10/27/2023)   Exercise Vital Sign    Days of Exercise per Week: 2 days    Minutes of Exercise per Session: 10 min  Stress: No Stress Concern Present (10/27/2023)   [employer] of Occupational Health - Occupational Stress Questionnaire    Feeling of Stress: Only a little  Social  Connections: Socially Integrated (10/27/2023)   Social Connection and Isolation Panel    Frequency of Communication with Friends and Family: Three times a week    Frequency of Social Gatherings with Friends and Family: Three times a week    Attends Religious Services: More than 4 times per year    Active Member of Clubs or Organizations: Yes    Attends Engineer, structural: More than 4 times per year    Marital Status: Married    Tobacco Counseling Ready to quit: Not Answered Counseling given: Not Answered    Clinical Intake:  Pre-visit preparation completed: Yes  Pain : 0-10 Pain Score: 3  Pain Type: Chronic pain Pain Location: Back Pain Orientation: Lower Pain Descriptors / Indicators: Dull Pain Onset: More than a month ago Pain Frequency: Intermittent     BMI - recorded: 26.85 Nutritional Status: BMI 25 -29 Overweight Nutritional Risks: None Diabetes: Yes CBG done?: Yes (FBS 117 per patient) CBG resulted in Enter/ Edit results?: No  Lab Results  Component Value Date   HGBA1C 7.3 (A) 09/08/2023   HGBA1C 6.8 (A) 04/24/2023   HGBA1C 7.4 (A) 01/23/2023     How often do you need to have someone help you when you read instructions, pamphlets, or other written materials from your doctor or pharmacy?: 1 - Never  Interpreter Needed?: No  Information entered by :: R. Eaden Hettinger LPN   Activities of Daily Living     10/27/2023    8:59 AM 11/26/2022    9:05 AM  In your present state of health, do you have any difficulty performing the following activities:  Hearing? 1 0  Vision? 0 0  Difficulty concentrating or making decisions? 0 0  Walking or climbing stairs? 1   Dressing or bathing? 0   Doing errands, shopping? 0   Preparing Food and eating ? N   Using the Toilet? N   In the past six months, have you accidently leaked urine? N   Do you have problems with loss of bowel control? N   Managing your Medications? N   Managing your Finances? N   Housekeeping  or managing your Housekeeping? N     Patient Care Team: Dineen Rollene MATSU, [name] as PCP - General (Family Medicine) Perla Evalene PARAS, MD as PCP - Cardiology (Cardiology) Cindie Ole DASEN, MD as PCP - Electrophysiology (Cardiology) Marcelino Nurse, MD as Consulting Physician (Pain Medicine) Hester Alm BROCKS, MD (Dermatology)  I have updated your Care Teams any recent Medical Services you may have received from other providers in the past year.     Assessment:   This is a routine wellness examination for [place].  Hearing/Vision screen No results found.   Goals Addressed             This Visit's Progress    Patient Stated       Wants to eliminate back issues and increase physical activity       Depression Screen     10/27/2023    9:09 AM 09/08/2023   11:26 AM 09/03/2023    8:08 AM 08/19/2023   11:25 AM 07/21/2023    8:57 AM 06/17/2023    2:10 PM 05/26/2023   10:51 AM  PHQ 2/9 Scores  PHQ - 2 Score 0 0 0 0 0 1 0  PHQ- 9 Score 0          Fall Risk     10/27/2023    9:03 AM 09/08/2023   11:25 AM 09/03/2023    8:08 AM 08/19/2023   11:25 AM 07/21/2023    8:57 AM  Fall Risk   Falls in the past year? 0 0 0 0 0  Number falls in past yr: 0 0     Injury with Fall? 0 0     Risk for fall due to : No Fall Risks No Fall Risks     Follow up Falls evaluation completed;Falls prevention discussed Falls evaluation completed       MEDICARE RISK AT HOME:  Medicare Risk at Home Any stairs in or around the home?: Yes If so, are there any without handrails?: No Home free of loose throw rugs in walkways, pet beds, electrical cords, etc?: Yes Adequate lighting in your home to reduce risk of falls?: Yes Life alert?: No Use of a cane, walker or w/c?: No Grab bars in the bathroom?: Yes Shower chair or bench in shower?: No Elevated toilet seat or a handicapped toilet?: Yes  TIMED UP AND GO:  Was the test performed?  No  Cognitive Function: 6CIT completed        10/27/2023     9:14 AM 10/21/2022    9:19 AM 10/12/2020   10:13 AM 10/06/2018   10:56 AM 07/04/2017    2:47 PM  6CIT Screen  What Year? 0 points 0 points 0 points 0 points 0 points  What month? 0 points 0 points 0 points 0 points 0 points  What time? 0 points 0 points 0 points 0 points 0 points  Count back from 20 0 points 0 points  0 points 0 points  Months in reverse 0 points 0  points 0 points 0 points 0 points  Repeat phrase 0 points 0 points  0 points 0 points  Total Score 0 points 0 points  0 points 0 points    Immunizations Immunization History  Administered Date(s) Administered   Fluad Quad(high Dose 65+) 10/13/2020, 09/28/2021   INFLUENZA, HIGH DOSE SEASONAL PF 11/01/2015, 10/20/2017, 09/28/2019   Influenza,inj,Quad PF,6+ Mos 10/08/2016   Influenza-Unspecified 10/01/2018   Moderna Sars-Covid-2 Vaccination 05/08/2020   PFIZER(Purple Top)SARS-COV-2 Vaccination 02/04/2019, 02/25/2019, 10/12/2019   PNEUMOCOCCAL CONJUGATE-20 02/07/2021   Pfizer Covid-19 Vaccine Bivalent Booster 74yrs & up 11/04/2020    Screening Tests Health Maintenance  Topic Date Due   DTaP/Tdap/Td (1 - Tdap) Never done   Zoster Vaccines- Shingrix (1 of 2) Never done   COVID-19 Vaccine (7 - 2025-26 season) 09/15/2023   Medicare Annual Wellness (AWV)  10/21/2023   Diabetic kidney evaluation - eGFR measurement  11/22/2023   Influenza Vaccine  04/13/2024 (Originally 08/15/2023)   OPHTHALMOLOGY EXAM  11/08/2023   HEMOGLOBIN A1C  03/10/2024   Diabetic kidney evaluation - Urine ACR  09/07/2024   FOOT EXAM  09/07/2024   Pneumococcal Vaccine: 50+ Years  Completed   Meningococcal B Vaccine  Aged Out   Hepatitis B Vaccines 19-59 Average Risk  Discontinued   Hepatitis C Screening  Discontinued    Health Maintenance Items Addressed: Discussed the need to update covid, tetanus (Tdap) and shingles vaccines.   Additional Screening:  Vision Screening: Recommended annual ophthalmology exams for early detection of glaucoma and  other disorders of the eye. Is the patient up to date with their annual eye exam?  Yes  Who is the provider or what is the name of the office in which the patient attends annual eye exams?  [facility]  Dental Screening: Recommended annual dental exams for proper oral hygiene  Community Resource Referral / Chronic Care Management: CRR required this visit?  No   CCM required this visit?  No   Plan:    I have personally reviewed and noted the following in the patient's chart:   Medical and social history Use of alcohol , tobacco or illicit drugs  Current medications and supplements including opioid prescriptions. Patient is currently taking opioid prescriptions. Information provided to patient regarding non-opioid alternatives. Patient advised to discuss non-opioid treatment plan with their provider. Functional ability and status Nutritional status Physical activity Advanced directives List of other physicians Hospitalizations, surgeries, and ER visits in previous 12 months Vitals Screenings to include cognitive, depression, and falls Referrals and appointments  In addition, I have reviewed and discussed with patient certain preventive protocols, quality metrics, and best practice recommendations. A written personalized care plan for preventive services as well as general preventive health recommendations were provided to patient.   Angeline Fredericks, LPN   [id number]   After Visit Summary: (MyChart) Due to this being a telephonic visit, the after visit summary with patients personalized plan was offered to patient via MyChart   Notes: Nothing significant to report at this time.

## 2023-10-27 NOTE — Patient Instructions (Signed)
 [name] [name],  Thank you for taking the time for your Medicare Wellness Visit. I appreciate your continued commitment to your health goals. Please review the care plan we discussed, and feel free to reach out if I can assist you further.  Medicare recommends these wellness visits once per year to help you and your care team stay ahead of potential health issues. These visits are designed to focus on prevention, allowing your provider to concentrate on managing your acute and chronic conditions during your regular appointments.  Please note that Annual Wellness Visits do not include a physical exam. Some assessments may be limited, especially if the visit was conducted virtually. If needed, we may recommend a separate in-person follow-up with your provider.  Ongoing Care Seeing your primary care provider every 3 to 6 months helps us  monitor your health and provide consistent, personalized care.  Remember to update your tetanus (Tdap), covid and shingles vaccines.  Referrals If a referral was made during today's visit and you haven't received any updates within two weeks, please contact the referred provider directly to check on the status.  Recommended Screenings:  Health Maintenance  Topic Date Due   DTaP/Tdap/Td vaccine (1 - Tdap) Never done   Zoster (Shingles) Vaccine (1 of 2) Never done   COVID-19 Vaccine (7 - 2025-26 season) 09/15/2023   Yearly kidney function blood test for diabetes  11/22/2023   Eye exam for diabetics  11/08/2023   Hemoglobin A1C  03/10/2024   Yearly kidney health urinalysis for diabetes  09/07/2024   Complete foot exam   09/07/2024   Medicare Annual Wellness Visit  10/26/2024   Pneumococcal Vaccine for age over 35  Completed   Flu Shot  Completed   Meningitis B Vaccine  Aged Out   Hepatitis B Vaccine  Discontinued   Hepatitis C Screening  Discontinued       10/27/2023    9:13 AM  Advanced Directives  Does Patient Have a Medical Advance Directive? Yes   Type of Estate agent of [place];Living will  Does patient want to make changes to medical advance directive? No - Patient declined  Copy of Healthcare Power of Attorney in Chart? Yes - validated most recent copy scanned in chart (See row information)   Advance Care Planning is important because it: Ensures you receive medical care that aligns with your values, goals, and preferences. Provides guidance to your family and loved ones, reducing the emotional burden of decision-making during critical moments.  Vision: Annual vision screenings are recommended for early detection of glaucoma, cataracts, and diabetic retinopathy. These exams can also reveal signs of chronic conditions such as diabetes and high blood pressure.  Dental: Annual dental screenings help detect early signs of oral cancer, gum disease, and other conditions linked to overall health, including heart disease and diabetes.  Please see the attached documents for additional preventive care recommendations.   Managing Pain Without Opioids Opioids are strong medicines used to treat moderate to severe pain. For some people, especially those who have long-term (chronic) pain, opioids may not be the best choice for pain management due to: Side effects like nausea, constipation, and sleepiness. The risk of addiction (opioid use disorder). The longer you take opioids, the greater your risk of addiction. Pain that lasts for more than 3 months is called chronic pain. Managing chronic pain usually requires more than one approach and is often provided by a team of health care providers working together (multidisciplinary approach). Pain management may be done  at a pain management center or pain clinic. How to manage pain without the use of opioids Use non-opioid medicines Non-opioid medicines for pain may include: Over-the-counter or prescription non-steroidal anti-inflammatory drugs (NSAIDs). These may be the first  medicines used for pain. They work well for muscle and bone pain, and they reduce swelling. Acetaminophen . This over-the-counter medicine may work well for milder pain but not swelling. Antidepressants. These may be used to treat chronic pain. A certain type of antidepressant (tricyclics) is often used. These medicines are given in lower doses for pain than when used for depression. Anticonvulsants. These are usually used to treat seizures but may also reduce nerve (neuropathic) pain. Muscle relaxants. These relieve pain caused by sudden muscle tightening (spasms). You may also use a pain medicine that is applied to the skin as a patch, cream, or gel (topical analgesic), such as a numbing medicine. These may cause fewer side effects than medicines taken by mouth. Do certain therapies as directed Some therapies can help with pain management. They include: Physical therapy. You will do exercises to gain strength and flexibility. A physical therapist may teach you exercises to move and stretch parts of your body that are weak, stiff, or painful. You can learn these exercises at physical therapy visits and practice them at home. Physical therapy may also involve: Massage. Heat wraps or applying heat or cold to affected areas. Electrical signals that interrupt pain signals (transcutaneous electrical nerve stimulation, TENS). Weak lasers that reduce pain and swelling (low-level laser therapy). Signals from your body that help you learn to regulate pain (biofeedback). Occupational therapy. This helps you to learn ways to function at home and work with less pain. Recreational therapy. This involves trying new activities or hobbies, such as a physical activity or drawing. Mental health therapy, including: Cognitive behavioral therapy (CBT). This helps you learn coping skills for dealing with pain. Acceptance and commitment therapy (ACT) to change the way you think and react to pain. Relaxation therapies,  including muscle relaxation exercises and mindfulness-based stress reduction. Pain management counseling. This may be individual, family, or group counseling.  Receive medical treatments Medical treatments for pain management include: Nerve block injections. These may include a pain blocker and anti-inflammatory medicines. You may have injections: Near the spine to relieve chronic back or neck pain. Into joints to relieve back or joint pain. Into nerve areas that supply a painful area to relieve body pain. Into muscles (trigger point injections) to relieve some painful muscle conditions. A medical device placed near your spine to help block pain signals and relieve nerve pain or chronic back pain (spinal cord stimulation device). Acupuncture. Follow these instructions at home Medicines Take over-the-counter and prescription medicines only as told by your health care provider. If you are taking pain medicine, ask your health care providers about possible side effects to watch out for. Do not drive or use heavy machinery while taking prescription opioid pain medicine. Lifestyle  Do not use drugs or alcohol  to reduce pain. If you drink alcohol , limit how much you have to: 0-1 drink a day for women who are not pregnant. 0-2 drinks a day for men. Know how much alcohol  is in a drink. In the [place] one drink equals one 12 oz bottle of beer (355 mL), one 5 oz glass of wine (148 mL), or one 1 oz glass of hard liquor (44 mL). Do not use any products that contain nicotine or tobacco. These products include cigarettes, chewing tobacco, and vaping devices, such  as e-cigarettes. If you need help quitting, ask your health care provider. Eat a healthy diet and maintain a healthy weight. Poor diet and excess weight may make pain worse. Eat foods that are high in fiber. These include fresh fruits and vegetables, whole grains, and beans. Limit foods that are high in fat and processed sugars, such as fried and  sweet foods. Exercise regularly. Exercise lowers stress and may help relieve pain. Ask your health care provider what activities and exercises are safe for you. If your health care provider approves, join an exercise class that combines movement and stress reduction. Examples include yoga and tai chi. Get enough sleep. Lack of sleep may make pain worse. Lower stress as much as possible. Practice stress reduction techniques as told by your therapist. General instructions Work with all your pain management providers to find the treatments that work best for you. You are an important member of your pain management team. There are many things you can do to reduce pain on your own. Consider joining an online or in-person support group for people who have chronic pain. Keep all follow-up visits. This is important. Where to find more information You can find more information about managing pain without opioids from: American Academy of Pain Medicine: painmed.org Institute for Chronic Pain: instituteforchronicpain.org American Chronic Pain Association: theacpa.org Contact a health care provider if: You have side effects from pain medicine. Your pain gets worse or does not get better with treatments or home therapy. You are struggling with anxiety or depression. Summary Many types of pain can be managed without opioids. Chronic pain may respond better to pain management without opioids. Pain is best managed when you and a team of health care providers work together. Pain management without opioids may include non-opioid medicines, medical treatments, physical therapy, mental health therapy, and lifestyle changes. Tell your health care providers if your pain gets worse or is not being managed well enough. This information is not intended to replace advice given to you by your health care provider. Make sure you discuss any questions you have with your health care provider. Document Revised: 04/12/2020  Document Reviewed: 04/12/2020 Elsevier Patient Education  2024 [employer].

## 2023-10-30 ENCOUNTER — Ambulatory Visit
Attending: Student in an Organized Health Care Education/Training Program | Admitting: Student in an Organized Health Care Education/Training Program

## 2023-10-30 ENCOUNTER — Encounter: Payer: Self-pay | Admitting: Student in an Organized Health Care Education/Training Program

## 2023-10-30 VITALS — BP 115/71 | HR 50 | Temp 96.8°F | Resp 16 | Ht 72.0 in | Wt 198.0 lb

## 2023-10-30 DIAGNOSIS — M47816 Spondylosis without myelopathy or radiculopathy, lumbar region: Secondary | ICD-10-CM | POA: Diagnosis present

## 2023-10-30 DIAGNOSIS — G894 Chronic pain syndrome: Secondary | ICD-10-CM | POA: Diagnosis present

## 2023-10-30 NOTE — Progress Notes (Signed)
 PROVIDER NOTE: Interpretation of information contained herein should be left to medically-trained personnel. Specific patient instructions are provided elsewhere under Patient Instructions section of medical record. This document was created in part using AI and STT-dictation technology, any transcriptional errors that may result from this process are unintentional.  Patient: [name] [name]  Service: E/M   PCP: Dineen Rollene MATSU, [name]  DOB: Jul 07, 1940  DOS: 10/30/2023  Provider: Wallie Sherry, MD  MRN: [id number]  Delivery: Face-to-face  Specialty: Interventional Pain Management  Type: Established Patient  Setting: Ambulatory outpatient facility  Specialty designation: 09  Referring Prov.: Dineen Rollene MATSU, [name]  Location: Outpatient office facility       History of present illness (HPI) [name], a 83 y.o. year old male, is here today because of his Lumbar spondylosis [M47.816]. Mr. Hoh primary complain today is Back Pain  Pain Assessment: Severity of Chronic pain is reported as a 5 /10. Location: Back Lower/denies. Onset: More than a month ago. Quality: Spasm. Timing: Intermittent. Modifying factor(s): rest, Tylenol . Vitals:  height is 6' (1.829 m) and weight is 198 lb (89.8 kg). His temperature is 96.8 F (36 C) (abnormal). His blood pressure is 115/71 and his pulse is 50 (abnormal). His respiration is 16 and oxygen saturation is 96%.  BMI: Estimated body mass index is 26.85 kg/m as calculated from the following:   Height as of this encounter: 6' (1.829 m).   Weight as of this encounter: 198 lb (89.8 kg).  Last encounter: 08/19/2023. Last procedure: 09/03/2023.  Reason for encounter:  Post-Procedure Evaluation   Procedure: Lumbar Facet, Medial Branch Radiofrequency Ablation (RFA) #1  Laterality: Bilateral (-50)  Level: L3, L4, and L5 Medial Branch Level(s). These levels will denervate the L3-4 and L4-5 lumbar facet joints.  Imaging: Fluoroscopy-guided          Anesthesia: Local anesthesia (1-2% Lidocaine ) Sedation: Minimal Sedation                       DOS: 09/03/2023  Performed by: Wallie Sherry, MD  Purpose: Therapeutic/Palliative Indications: Low back pain severe enough to impact quality of life or function. Indications: 1. Lumbar spondylosis   2. Lumbar facet arthropathy   3. Chronic pain syndrome    [name] has been dealing with the above chronic pain for longer than three months and has either failed to respond, was unable to tolerate, or simply did not get enough benefit from other more conservative therapies including, but not limited to: 1. Over-the-counter medications 2. Anti-inflammatory medications 3. Muscle relaxants 4. Membrane stabilizers 5. Opioids 6. Physical therapy and/or chiropractic manipulation 7. Modalities (Heat, ice, etc.) 8. Invasive techniques such as nerve blocks. [name] has attained more than 50% relief of the pain from a series of diagnostic injections conducted in separate occasions.  Pain Score: Pre-procedure: 2 /10 Post-procedure: 2 /10    Effectiveness:  Initial hour after procedure: 100 %  Subsequent 4-6 hours post-procedure: 100 %  Analgesia past initial 6 hours: 50% Ongoing improvement:  Analgesic: 50% however this is confounded by increased lumbar paraspinal spasms Function: Somewhat improved ROM: Somewhat improved    No results found for: CBDTHCR No results found for: D8THCCBX No results found for: D9THCCBX  ROS  Constitutional: Denies any fever or chills Gastrointestinal: No reported hemesis, hematochezia, vomiting, or acute GI distress Musculoskeletal: Denies any acute onset joint swelling, redness, loss of ROM, or weakness Neurological: No reported episodes of acute onset apraxia, aphasia, dysarthria, agnosia, amnesia, paralysis, loss  of coordination, or loss of consciousness  Medication Review  Glucosamine, Glycerin-Hypromellose-PEG 400, Omega-3 Fatty Acids,  Psyllium, Semaglutide  (1 MG/DOSE), amiodarone , aspirin  EC, azelastine , bisacodyl, carvedilol , clopidogrel , cyclobenzaprine , dextromethorphan-guaiFENesin, ezetimibe , famotidine, gabapentin , glipiZIDE , glucose blood, lisinopril , midodrine , multivitamin, onetouch ultrasoft, pantoprazole , rosuvastatin , sodium chloride , spironolactone , tamsulosin , and traMADol   History Review  Allergy: [name] is allergic to metformin  and related, azithromycin, other, percocet [oxycodone -acetaminophen ], and jardiance  [empagliflozin ]. Drug: [name]  reports no history of drug use. Alcohol :  reports current alcohol  use. Tobacco:  reports that he has been smoking pipe. He has been exposed to tobacco smoke. He quit smokeless tobacco use about 8 years ago. Social: [name]  reports that he has been smoking pipe. He has been exposed to tobacco smoke. He quit smokeless tobacco use about 8 years ago. He reports current alcohol  use. He reports that he does not use drugs. Medical:  has a past medical history of Aortic insufficiency, Arthritis, CAD (coronary artery disease), Chicken pox, Colon polyps, Diverticulitis, DM type 2 (diabetes mellitus, type 2) (HCC), Family history of adverse reaction to anesthesia, GERD (gastroesophageal reflux disease), Heart murmur, History of kidney stones, HOH (hard of hearing), Hyperlipidemia, Hypertension, Ischemic cardiomyopathy, Kidney stones, Melanoma (HCC) (1980), Mitral regurgitation, and Myocardial infarction (HCC). Surgical: [name]  has a past surgical history that includes Cholecystectomy (2012); Tonsillectomy (1945); Lithotripsy (2015); Melanoma excision (1980); Nerve Surgery (2015); Coronary angioplasty with stent (1991 & 2005); Wisdom tooth extraction; Colonoscopy; Esophagogastroduodenoscopy (egd) with propofol  (N/A, 11/19/2016); Cystoscopy/ureteroscopy/holmium laser/stent placement (Right, 01/02/2018); Esophagogastroduodenoscopy (egd) with propofol  (N/A, 02/02/2019); LEFT  HEART CATH AND CORONARY ANGIOGRAPHY (N/A, 07/07/2019); CORONARY PRESSURE/FFR STUDY (N/A, 07/07/2019); CORONARY ATHERECTOMY (N/A, 07/08/2019); Left Heart Cath (N/A, 07/08/2019); Coronary Ultrasound/IVUS (N/A, 07/08/2019); CORONARY STENT INTERVENTION (N/A, 07/08/2019); CORONARY BALLOON ANGIOPLASTY (N/A, 07/08/2019); ICD IMPLANT (N/A, 02/02/2020); LEAD REVISION/REPAIR (N/A, 02/16/2020); BIV UPGRADE (N/A, 02/26/2021); pre cancer lesion from scalp (09/2022); and RIGHT HEART CATH AND CORONARY ANGIOGRAPHY (Bilateral, 11/26/2022). Family: family history includes Colon cancer in his father; Diabetes in his father and mother; Heart attack in his mother; Heart disease in his mother; Hyperlipidemia in his mother; Hypertension in his mother; Kidney cancer in his father; Liver cancer in his father; Lung cancer in his father.  Laboratory Chemistry Profile   Renal Lab Results  Component Value Date   BUN 22 11/22/2022   CREATININE 1.04 11/22/2022   BCR 21 11/22/2022   GFR 73.48 10/23/2022   GFRAA >60 07/09/2019   GFRNONAA >60 02/09/2022    Hepatic Lab Results  Component Value Date   AST 46 (H) 10/23/2022   ALT 37 10/23/2022   ALBUMIN 3.9 10/23/2022   ALKPHOS 76 10/23/2022   HCVAB NON REACTIVE 03/25/2019   LIPASE 55.0 09/09/2016    Electrolytes Lab Results  Component Value Date   NA 139 11/26/2022   K 4.0 11/26/2022   CL 105 11/22/2022   CALCIUM  9.2 11/22/2022   MG 1.4 (L) 05/20/2020    Bone Lab Results  Component Value Date   VD25OH 33.0 01/08/2017    Inflammation (CRP: Acute Phase) (ESR: Chronic Phase) Lab Results  Component Value Date   CRP <0.8 01/08/2017   ESRSEDRATE 10 01/08/2017         Note: Above Lab results reviewed.  Recent Imaging Review  DG PAIN CLINIC C-ARM 1-60 MIN NO REPORT Fluoro was used, but no Radiologist interpretation will be provided.  Please refer to NOTES tab for provider progress note. Note: Reviewed        Physical Exam  Vitals: BP 115/71  Pulse (!)  50   Temp (!) 96.8 F (36 C)   Resp 16   Ht 6' (1.829 m)   Wt 198 lb (89.8 kg)   SpO2 96%   BMI 26.85 kg/m  BMI: Estimated body mass index is 26.85 kg/m as calculated from the following:   Height as of this encounter: 6' (1.829 m).   Weight as of this encounter: 198 lb (89.8 kg). Ideal: Ideal body weight: 77.6 kg (171 lb 1.2 oz) Adjusted ideal body weight: 82.5 kg (181 lb 13.5 oz) General appearance: Well nourished, well developed, and well hydrated. In no apparent acute distress Mental status: Alert, oriented x 3 (person, place, & time)       Respiratory: No evidence of acute respiratory distress Eyes: PERLA   Assessment   Diagnosis Status  1. Lumbar spondylosis   2. Lumbar facet arthropathy   3. Chronic pain syndrome    Controlled Controlled Controlled   Updated Problems: No problems updated.  Plan of Care  Assessment and Plan    Chronic back pain with muscle spasms   Chronic back pain with muscle spasms worsens with ambulation or carrying items, requiring rest. Previous ablation provided some relief in regards to his axial low back pain but is difficult to clearly evaluate given increased lumbar paraspinal muscle spasms. He experiences persistent bilateral muscle spasms without sharp pain, marked by muscle contractions. Physical therapy was last undertaken over a year ago. Discussed potential benefits of resuming physical therapy and complementary pain management strategies, which may improve core strength and range of motion with low risk. Refer to physical therapy at [facility] for 4-6 weeks, 1-2 sessions per week since he had good experience with them previously. Encourage continuation of exercises and movements taught in physical therapy at home. Reassess after the new year to evaluate strength and baseline condition post-ablation.        Orders:  Orders Placed This Encounter  Procedures   Ambulatory referral to Physical Therapy    Referral Priority:   Routine     Referral Type:   Physical Medicine    Referral Reason:   Specialty Services Required    Requested Specialty:   Physical Therapy    Number of Visits Requested:   1     BLF L3-5 05/26/23, 07/21/23; RFA 09/03/23    Return in about 3 months (around 01/30/2024) for after PT (s/p RFA).    Recent Visits Date Type Provider Dept  09/03/23 Procedure visit Marcelino Nurse, MD [facility]  08/19/23 Office Visit Marcelino Nurse, MD [facility]  Showing recent visits within past 90 days and meeting all other requirements Today's Visits Date Type Provider Dept  10/30/23 Office Visit Marcelino Nurse, MD [facility]  Showing today's visits and meeting all other requirements Future Appointments No visits were found meeting these conditions. Showing future appointments within next 90 days and meeting all other requirements  I discussed the assessment and treatment plan with the patient. The patient was provided an opportunity to ask questions and all were answered. The patient agreed with the plan and demonstrated an understanding of the instructions.  Patient advised to call back or seek an in-person evaluation if the symptoms or condition worsens.  I personally spent a total of 20 minutes in the care of the patient today including preparing to see the patient, getting/reviewing separately obtained history, performing a medically appropriate exam/evaluation, counseling and educating, placing orders, and documenting clinical information in the EHR.   Note  by: Wallie Sherry, MD (TTS and AI technology used. I apologize for any typographical errors that were not detected and corrected.) Date: 10/30/2023; Time: 11:48 AM

## 2023-10-30 NOTE — Progress Notes (Signed)
 Safety precautions to be maintained throughout the outpatient stay will include: orient to surroundings, keep bed in low position, maintain call bell within reach at all times, provide assistance with transfer out of bed and ambulation.

## 2023-11-04 ENCOUNTER — Ambulatory Visit

## 2023-11-04 DIAGNOSIS — L821 Other seborrheic keratosis: Secondary | ICD-10-CM | POA: Diagnosis not present

## 2023-11-04 DIAGNOSIS — L578 Other skin changes due to chronic exposure to nonionizing radiation: Secondary | ICD-10-CM | POA: Diagnosis not present

## 2023-11-04 DIAGNOSIS — Z1283 Encounter for screening for malignant neoplasm of skin: Secondary | ICD-10-CM | POA: Diagnosis not present

## 2023-11-04 DIAGNOSIS — L570 Actinic keratosis: Secondary | ICD-10-CM

## 2023-11-04 DIAGNOSIS — W908XXA Exposure to other nonionizing radiation, initial encounter: Secondary | ICD-10-CM

## 2023-11-04 DIAGNOSIS — L814 Other melanin hyperpigmentation: Secondary | ICD-10-CM

## 2023-11-04 DIAGNOSIS — Z85828 Personal history of other malignant neoplasm of skin: Secondary | ICD-10-CM

## 2023-11-04 NOTE — Patient Instructions (Signed)

## 2023-11-04 NOTE — Progress Notes (Signed)
 Subjective   [name] [name] is a 83 y.o. male who presents for the following: Lesion(s) of concern . Patient is established patient   Today patient reports: Irregular skin lesions on the scalp  Review of Systems:    No other skin or systemic complaints except as noted in HPI or Assessment and Plan.  The following portions of the chart were reviewed this encounter and updated as appropriate: medications, allergies, medical history  Relevant Medical History:  Personal history of melanoma - see medical history for full details  and Personal history of actinic keratosis   Objective  Well appearing patient in no apparent distress; mood and affect are within normal limits. Examination was performed of the: Focused Exam of: the face, scalp, arms, and hands   Examination notable for: Lentigo/lentigines: Scattered pigmented macules that are tan to brown in color and are somewhat non-uniform in shape and concentrated in the sun-exposed areas, Seborrheic Keratosis(es): Stuck-on appearing keratotic papule(s) on the trunk, none  irritated with redness, crusting, edema, and/or partial avulsion, Actinic Damage/Elastosis: chronic sun damage: dyspigmentation, telangiectasia, and wrinkling, Actinic keratosis: Scaly erythematous macule(s) concentrated on sun exposed areas   Examination limited by: Clothing and Patient deferred removal     scalp and face x 17 (17) Erythematous thin papules/macules with gritty scale.   Assessment & Plan   SKIN CANCER SCREENING PERFORMED TODAY.  BENIGN SKIN FINDINGS  - Lentigines  - Seborrheic keratoses - Reassurance provided regarding the benign appearance of lesions noted on exam today; no treatment is indicated in the absence of symptoms/changes. - Reinforced importance of photoprotective strategies including liberal and frequent sunscreen use of a broad-spectrum SPF 30 or greater, use of protective clothing, and sun avoidance for prevention of cutaneous malignancy  and photoaging.  Counseled patient on the importance of regular self-skin monitoring as well as routine clinical skin examinations as scheduled.   ACTINIC DAMAGE - Chronic condition, secondary to cumulative UV/sun exposure - Recommend daily broad spectrum sunscreen SPF 30+ to sun-exposed areas, reapply every 2 hours as needed.  - Staying in the shade or wearing long sleeves, sun glasses (UVA+UVB protection) and wide brim hats (4-inch brim around the entire circumference of the hat) are also recommended for sun protection.  - Call for new or changing lesions.  Personal history of non melanoma skin cancer  and melanoma - Reviewed medical history for full details  - Reviewed sun protective measures as above - Encouraged full body skin exams     Level of service outlined above   Procedures, orders, diagnosis for this visit:  ACTINIC KERATOSIS (17) scalp and face x 17 (17)  Destruction of lesion - scalp and face x 17 (17) Complexity: simple   Destruction method: cryotherapy   Informed consent: discussed and consent obtained   Timeout:  patient name, date of birth, surgical site, and procedure verified Lesion destroyed using liquid nitrogen: Yes   Region frozen until ice ball extended beyond lesion: Yes   Cryo cycles: 1 or 2. Outcome: patient tolerated procedure well with no complications   Post-procedure details: wound care instructions given    LENTIGO   SEBORRHEIC KERATOSIS   ACTINIC SKIN DAMAGE    Actinic keratosis -     Destruction of lesion  Lentigo  Seborrheic keratosis  Actinic skin damage   Return to clinic: Return for appointment as scheduled.  [name] [name], CMA, am acting as scribe for Lauraine JAYSON Kanaris, MD .   Documentation: I have reviewed the above documentation  for accuracy and completeness, and I agree with the above.  Lauraine JAYSON Kanaris, MD

## 2023-11-07 ENCOUNTER — Encounter: Payer: Self-pay | Admitting: Cardiovascular Disease

## 2023-11-25 ENCOUNTER — Ambulatory Visit: Payer: Medicare Other

## 2023-11-25 DIAGNOSIS — I442 Atrioventricular block, complete: Secondary | ICD-10-CM | POA: Diagnosis not present

## 2023-11-26 ENCOUNTER — Ambulatory Visit: Payer: Self-pay

## 2023-11-26 LAB — CUP PACEART REMOTE DEVICE CHECK
Battery Remaining Longevity: 114 mo
Battery Remaining Percentage: 100 %
Brady Statistic RA Percent Paced: 11 %
Brady Statistic RV Percent Paced: 100 %
Date Time Interrogation Session: 20251111003400
HighPow Impedance: 76 Ohm
Implantable Lead Connection Status: 753985
Implantable Lead Connection Status: 753985
Implantable Lead Connection Status: 753985
Implantable Lead Implant Date: 20220202
Implantable Lead Implant Date: 20230213
Implantable Lead Implant Date: 20230213
Implantable Lead Location: 753858
Implantable Lead Location: 753859
Implantable Lead Location: 753860
Implantable Lead Model: 273
Implantable Lead Model: 4671
Implantable Lead Model: 7841
Implantable Lead Serial Number: 111255
Implantable Lead Serial Number: 1233258
Implantable Lead Serial Number: 861310
Implantable Pulse Generator Implant Date: 20230213
Lead Channel Impedance Value: 417 Ohm
Lead Channel Impedance Value: 564 Ohm
Lead Channel Impedance Value: 902 Ohm
Lead Channel Pacing Threshold Amplitude: 0.9 V
Lead Channel Pacing Threshold Amplitude: 1.3 V
Lead Channel Pacing Threshold Pulse Width: 0.4 ms
Lead Channel Pacing Threshold Pulse Width: 0.4 ms
Lead Channel Setting Pacing Amplitude: 1.8 V
Lead Channel Setting Pacing Amplitude: 2 V
Lead Channel Setting Pacing Amplitude: 2 V
Lead Channel Setting Pacing Pulse Width: 0.4 ms
Lead Channel Setting Pacing Pulse Width: 0.4 ms
Lead Channel Setting Sensing Sensitivity: 0.6 mV
Lead Channel Setting Sensing Sensitivity: 1 mV
Pulse Gen Serial Number: 279753

## 2023-11-26 NOTE — Telephone Encounter (Signed)
 Spoke with pt and informed him per verbal from Dr. Tullo that he needs to be evaluated at the ED today for imaging. Pt asked if he could just come in tomorrow and have xrays done and I advised him that he should not wait until tomorrow.

## 2023-11-26 NOTE — Telephone Encounter (Signed)
 PAS transferred call and pt not on line: will attempt to reach back out to pt.   Copied from CRM [id number]. Topic: Clinical - Red Word Triage >> Nov 26, 2023  4:46 PM Roselie BROCKS wrote: Red Word that prompted transfer to Nurse Triage: Patients wife (libby Spengler) states the patient has a pretty bad pain in his ride side since yesterday.

## 2023-11-26 NOTE — Telephone Encounter (Signed)
 FYI Only or Action Required?: Action required by provider: request for appointment.- no appointment available until 12/09/23  Patient was last seen in primary care on 09/08/2023 by Dineen Rollene MATSU, [name].  Called Nurse Triage reporting Abdominal Pain.  Symptoms began yesterday.  Interventions attempted: OTC medications:  SABRA  Symptoms are: unchanged.  Triage Disposition: See Physician Within 24 Hours  Patient/caregiver understands and will follow disposition?: Yes       Copied from CRM #[id number]. Topic: Clinical - Red Word Triage >> Nov 26, 2023  4:46 PM Roselie BROCKS wrote: Red Word that prompted transfer to Nurse Triage: Patients wife (libby Alemu) states the patient has a pretty bad pain in his ride side since yesterday.     Reason for Disposition  [1] MILD pain (e.g., does not interfere with normal activities) AND [2] pain comes and goes (cramps) [3] present > 48 hours  (Exception: This same abdominal pain is a chronic symptom recurrent or ongoing AND present > 4 weeks.)  Answer Assessment - Initial Assessment Questions 1. LOCATION: Where does it hurt?      Right upper abdominal pain 2. RADIATION: Does the pain shoot anywhere else? (e.g., chest, back)     denies 3. ONSET: When did the pain begin? (Minutes, hours or days ago)      Two days ago  5. PATTERN Does the pain come and go, or is it constant?     Constant mild pain that worsens with palpation  7. RECURRENT SYMPTOM: Have you ever had this type of stomach pain before? If Yes, ask: When was the last time? and What happened that time?      denies 8. CAUSE: What do you think is causing the stomach pain? (e.g., gallstones, recent abdominal surgery)     unknown 9. RELIEVING/AGGRAVATING FACTORS: What makes it better or worse? (e.g., antacids, bending or twisting motion, bowel movement)     none 10. OTHER SYMPTOMS: Do you have any other symptoms? (e.g., back pain, diarrhea, fever, urination pain,  vomiting)       denies  Protocols used: Abdominal Pain - Male-A-AH

## 2023-11-27 NOTE — Telephone Encounter (Signed)
 Call patient please since did not go to ED.

## 2023-11-27 NOTE — Telephone Encounter (Signed)
 Spoke to pt  he did got to ED at [facility], they did admit him for an Abscess on his Liver. Will schedule f/up when he gets released

## 2023-11-28 ENCOUNTER — Other Ambulatory Visit: Payer: Self-pay | Admitting: Cardiovascular Disease

## 2023-11-28 ENCOUNTER — Ambulatory Visit: Payer: Self-pay | Admitting: Cardiology

## 2023-11-28 NOTE — Progress Notes (Signed)
 Remote ICD Transmission

## 2023-11-29 ENCOUNTER — Other Ambulatory Visit: Payer: Self-pay | Admitting: Cardiovascular Disease

## 2023-12-01 NOTE — Progress Notes (Unsigned)
  Electrophysiology Office Follow up Visit Note:    Date:  12/03/2023   ID:  [name] [name], DOB 03-14-1940, MRN [id number]  PCP:  Dineen Rollene MATSU, [name]  [facility] Cardiologist:  Evalene Lunger, MD  [facility] Electrophysiologist:  OLE ONEIDA HOLTS, MD    Interval History:     [name] [name] is a 83 y.o. male who presents for a follow up visit.   The patient last saw Elvie February 26, 2023.  He has an extensive past medical history that includes chronic systolic heart failure secondary to ischemic cardiomyopathy complicated by ventricular tachycardia and LV mural thrombus.  He has a CRT-D in situ.  He is on amiodarone  for his rhythm control.  He is with his wife today in clinic.  He is doing well having recently been discharged from [facility].  His liver abscess was treated with IV antibiotics.  He has follow-up arranged with infectious disease at [facility].  He tells me that he only uses midodrine  about once per month.  He has paused his lisinopril  and spironolactone  after recent hospitalization for liver abscess given relatively low blood pressures.      Past medical, surgical, social and family history were reviewed.  ROS:   Please see the history of present illness.    All other systems reviewed and are negative.  EKGs/Labs/Other Studies Reviewed:    The following studies were reviewed today:  December 03, 2023 in clinic device interrogation personally reviewed Battery and lead parameter stable No high-voltage therapies delivered.        Physical Exam:    VS:  BP 120/64   Pulse 65   Ht 6' (1.829 m)   Wt 203 lb (92.1 kg)   SpO2 94%   BMI 27.53 kg/m     Wt Readings from Last 3 Encounters:  12/03/23 203 lb (92.1 kg)  10/30/23 198 lb (89.8 kg)  10/27/23 198 lb (89.8 kg)     GEN: no distress CARD: RRR, No MRG.  Generator pocket well-healed RESP: No IWOB. CTAB.      ASSESSMENT:    1. Chronic systolic heart failure (HCC)   2. VT (ventricular  tachycardia) (HCC)   3. Ischemic cardiomyopathy   4. Encounter for long-term (current) use of high-risk medication   5. Presence of cardiac resynchronization therapy defibrillator (CRT-D)    PLAN:    In order of problems listed above:  #Chronic systolic heart failure #Ventricular tachycardia #High risk med monitoring-amiodarone  NYHA class II.  Warm and dry on exam. Ventricular tachycardia is quiescent on amiodarone  Continue amiodarone  200 mg by mouth once daily Recent blood work at [facility] shows stable liver and thyroid  function. Lisinopril  and spironolactone  on hold given active infection and relatively lower blood pressures.  #CRT-D in situ Device functioning appropriately.  Continue remote monitoring  I discussed my upcoming departure from [facility] during today's clinic appointment.  He will continue to follow-up with one of my partners moving forward.  Follow-up 6 months with EP MD   Signed, Ole Holts, MD, [facility], [facility] 12/03/2023 11:16 AM    Electrophysiology [facility]

## 2023-12-01 NOTE — Progress Notes (Signed)
 [name] [name]                                          MRN: [id number]   12/01/2023   The VBCI Quality Team Specialist reviewed this patient medical record for the purposes of chart review for care gap closure. The following were reviewed: abstraction for care gap closure-kidney health evaluation for diabetes:eGFR  and uACR.    VBCI Quality Team

## 2023-12-02 ENCOUNTER — Ambulatory Visit: Payer: Self-pay | Admitting: Family

## 2023-12-03 ENCOUNTER — Ambulatory Visit: Attending: Cardiology | Admitting: Cardiology

## 2023-12-03 ENCOUNTER — Encounter: Payer: Self-pay | Admitting: Cardiology

## 2023-12-03 ENCOUNTER — Ambulatory Visit: Payer: Self-pay | Admitting: Cardiology

## 2023-12-03 VITALS — BP 120/64 | HR 65 | Ht 72.0 in | Wt 203.0 lb

## 2023-12-03 DIAGNOSIS — I255 Ischemic cardiomyopathy: Secondary | ICD-10-CM | POA: Diagnosis not present

## 2023-12-03 DIAGNOSIS — I472 Ventricular tachycardia, unspecified: Secondary | ICD-10-CM | POA: Diagnosis not present

## 2023-12-03 DIAGNOSIS — Z79899 Other long term (current) drug therapy: Secondary | ICD-10-CM | POA: Diagnosis not present

## 2023-12-03 DIAGNOSIS — I5022 Chronic systolic (congestive) heart failure: Secondary | ICD-10-CM | POA: Diagnosis not present

## 2023-12-03 DIAGNOSIS — Z95810 Presence of automatic (implantable) cardiac defibrillator: Secondary | ICD-10-CM

## 2023-12-03 LAB — CUP PACEART INCLINIC DEVICE CHECK
Date Time Interrogation Session: 20251119113623
HighPow Impedance: 71 Ohm
Implantable Lead Connection Status: 753985
Implantable Lead Connection Status: 753985
Implantable Lead Connection Status: 753985
Implantable Lead Implant Date: 20220202
Implantable Lead Implant Date: 20230213
Implantable Lead Implant Date: 20230213
Implantable Lead Location: 753858
Implantable Lead Location: 753859
Implantable Lead Location: 753860
Implantable Lead Model: 273
Implantable Lead Model: 4671
Implantable Lead Model: 7841
Implantable Lead Serial Number: 111255
Implantable Lead Serial Number: 1233258
Implantable Lead Serial Number: 861310
Implantable Pulse Generator Implant Date: 20230213
Lead Channel Impedance Value: 416 Ohm
Lead Channel Impedance Value: 572 Ohm
Lead Channel Impedance Value: 811 Ohm
Lead Channel Pacing Threshold Amplitude: 0.7 V
Lead Channel Pacing Threshold Amplitude: 0.9 V
Lead Channel Pacing Threshold Amplitude: 1.3 V
Lead Channel Pacing Threshold Pulse Width: 0.4 ms
Lead Channel Pacing Threshold Pulse Width: 0.4 ms
Lead Channel Pacing Threshold Pulse Width: 0.4 ms
Lead Channel Sensing Intrinsic Amplitude: 1 mV
Lead Channel Sensing Intrinsic Amplitude: 18.9 mV
Lead Channel Sensing Intrinsic Amplitude: 3.1 mV
Lead Channel Setting Pacing Amplitude: 2 V
Lead Channel Setting Pacing Amplitude: 2 V
Lead Channel Setting Pacing Amplitude: 2 V
Lead Channel Setting Pacing Pulse Width: 0.4 ms
Lead Channel Setting Pacing Pulse Width: 0.4 ms
Lead Channel Setting Sensing Sensitivity: 0.6 mV
Lead Channel Setting Sensing Sensitivity: 1 mV
Pulse Gen Serial Number: 279753

## 2023-12-03 NOTE — Patient Instructions (Signed)
 Medication Instructions:  Your physician recommends that you continue on your current medications as directed. Please refer to the Current Medication list given to you today.  *If you need a refill on your cardiac medications before your next appointment, please call your pharmacy*  Follow-Up: At [facility], you and your health needs are our priority.  As part of our continuing mission to provide you with exceptional heart care, our providers are all part of one team.  This team includes your primary Cardiologist (physician) and Advanced Practice Providers or APPs (Physician Assistants and Nurse Practitioners) who all work together to provide you with the care you need, when you need it.  Your next appointment:   6 months  Provider:   Fonda Kitty, MD or Suzann Riddle, NP

## 2023-12-09 ENCOUNTER — Ambulatory Visit: Admitting: Family

## 2023-12-09 ENCOUNTER — Encounter: Payer: Self-pay | Admitting: Family

## 2023-12-09 VITALS — BP 128/78 | HR 77 | Temp 98.3°F | Ht 72.0 in | Wt 203.0 lb

## 2023-12-09 DIAGNOSIS — E279 Disorder of adrenal gland, unspecified: Secondary | ICD-10-CM | POA: Diagnosis not present

## 2023-12-09 DIAGNOSIS — R911 Solitary pulmonary nodule: Secondary | ICD-10-CM | POA: Diagnosis not present

## 2023-12-09 DIAGNOSIS — I10 Essential (primary) hypertension: Secondary | ICD-10-CM

## 2023-12-09 DIAGNOSIS — E118 Type 2 diabetes mellitus with unspecified complications: Secondary | ICD-10-CM

## 2023-12-09 DIAGNOSIS — R899 Unspecified abnormal finding in specimens from other organs, systems and tissues: Secondary | ICD-10-CM

## 2023-12-09 DIAGNOSIS — Z7985 Long-term (current) use of injectable non-insulin antidiabetic drugs: Secondary | ICD-10-CM

## 2023-12-09 DIAGNOSIS — K750 Abscess of liver: Secondary | ICD-10-CM

## 2023-12-09 NOTE — Patient Instructions (Signed)
 I agree with holding spironolactone , lisinopril  and glipizide .  I have ordered CT chest to follow-up lung nodule.  Referral to endocrinology for hormone evaluation of adrenal nodule  Let us  know if you dont hear back within 2 weeks in regards to an appointment being scheduled.   So that you are aware, if you are [facility] MyChart user , please pay attention to your MyChart messages as you may receive a MyChart message with a phone number to call and schedule this test/appointment own your own from our referral coordinator. This is a new process so I do not want you to miss this message.  If you are not a MyChart user, you will receive a phone call.

## 2023-12-09 NOTE — Progress Notes (Signed)
 Assessment & Plan:  Adrenal nodule Assessment & Plan: Referral to endocrinology for further surveillance, hormone evaluation.  Orders: -     Ambulatory referral to Endocrinology  Lung nodule Assessment & Plan:  H/o 5 mm ground-glass nodule in the right lower lobe, follow up to ensure 5 years stability .Pending repeat CT chest.   Orders: -     CT CHEST WO CONTRAST; Future  Abnormal laboratory test -     CBC with Differential/Platelet -     Hepatitis A antibody, IgM -     Hepatitis A antibody, total  Essential hypertension Assessment & Plan: Blood pressure well-controlled.  Advised to remain off of spironolactone  and lisinopril .  Continue carvedilol  3.125 mg twice daily as prescribed by cardiology.  Continue midodrine  5 mg TID as needed.   Controlled type 2 diabetes mellitus with complication, without long-term current use of insulin  (HCC) Assessment & Plan: Excellent control.Goal of A1c less than 7.5.   Discussed risk of hypoglycemia on glipizide .   Continue Ozempic  1 mg weekly   Liver abscess Assessment & Plan: Hospitalization course reviewed.  Medications reconciled.  Abdominal pain is resolved.  Advised patient to continue outpatient course Augmentin .  Follow-up in CT abdomen and pelvis is scheduled with infectious disease.  Will follow      Return precautions given.   Risks, benefits, and alternatives of the medications and treatment plan prescribed today were discussed, and patient expressed understanding.   Education regarding symptom management and diagnosis given to patient on AVS either electronically or printed.  Return in about 6 weeks (around 01/20/2024).  [name], [name]  Subjective:    Patient ID: [name] [name], male    DOB: 04-16-1940, 83 y.o.   MRN: [id number]  CC: [name] [name] is a 83 y.o. male who presents today for follow up.   HPI: Accompanied by wife today  Discussed the use of AI scribe software for clinical note transcription  with the patient, who gave verbal consent to proceed.  History of Present Illness   [name] [name] is an 83 year old male with a liver abscess who presents for follow-up after recent hospitalization.  He initially experienced right upper quadrant pain, which increased in intensity, leading to an emergency room visit. A CT scan and lab work revealed a small liver abscess. He was started on a six-week regimen of Augmentin .  RUQ pain has resolved.   Denies fever, chill, constipation.   He has a history of diabetes and was previously on glipizide , which was discontinued due to concerns about hypoglycemia. He is currently monitoring his fasting blood sugar levels, which have been stable in the 120-130 range.Compliant with Ozempic , 1 mg.    He has not been using midodrine  regularly, only taking it when his blood pressure drops below 110 while standing. He continues to take carvedilol .      He is no longer on glipizide . Spironolactone , lisinopril  discontinued by Dr. Cindie.  Continued on amiodarone  200mg  every day  Follow-up infectious disease 12/25/2023 Dr Altha; CT abdomen ordered 12/22/23   Follow up with Dr Cindie 12/03/23 Consult with ID 11/28/23 Hospitalization follow-up.  Admitted 11/26/2023 right upper abdominal pain for 3 days, hepatic abscess GFR 81 AFP tumor marker 4 Blood culture no growth in 4 days Reactive hep a Nonreactive Hep b   CT abdomen/pelvis showed irregular 1.6 x 1.9 cm hypo-attenuating focus in the inferior posterior right hepatic lobe with adjacent inflammatory stranding, suspicious for an intraparenchymal hepatic abscess.     Lab  Results  Component Value Date   HGBA1C 7.3 (A) 09/08/2023    Allergies: Metformin  and related, Azithromycin, Other, Percocet [oxycodone -acetaminophen ], and Jardiance  [empagliflozin ] Current Outpatient Medications on File Prior to Visit  Medication Sig Dispense Refill   amiodarone  (PACERONE ) 200 MG tablet Take 1  tablet (200 mg total) by mouth daily. 90 tablet 3   amoxicillin -clavulanate (AUGMENTIN ) 875-125 MG tablet Take 1 tablet by mouth.     aspirin  EC 81 MG tablet Take 1 tablet (81 mg total) by mouth daily. Swallow whole. 90 tablet 3   azelastine  (ASTELIN ) 0.1 % nasal spray Place 1 spray into both nostrils 2 (two) times daily. Use in each nostril as directed 30 mL 6   bisacodyl (DULCOLAX) 5 MG EC tablet Take 5 mg by mouth daily as needed for moderate constipation.     clopidogrel  (PLAVIX ) 75 MG tablet TAKE 1 TABLET(75 MG) BY MOUTH DAILY 90 tablet 3   cyclobenzaprine  (FLEXERIL ) 5 MG tablet Take 5 mg by mouth as needed for muscle spasms.     dextromethorphan-guaiFENesin (MUCINEX DM) 30-600 MG 12hr tablet Take 1 tablet by mouth daily as needed (Sinus Congestion).     ezetimibe  (ZETIA ) 10 MG tablet TAKE 1 TABLET(10 MG) BY MOUTH DAILY 90 tablet 3   famotidine (PEPCID) 20 MG tablet Take 20 mg by mouth daily with lunch.     gabapentin  (NEURONTIN ) 100 MG capsule Take 1 capsule (100 mg total) by mouth at bedtime.     Glucosamine 500 MG CAPS Take 1,500 mg by mouth daily as needed.     Glycerin-Hypromellose-PEG 400 (DRY EYE RELIEF DROPS) 0.2-0.2-1 % SOLN Place 1 drop into both eyes daily.     Lancets (ONETOUCH ULTRASOFT) lancets Use as instructed 100 each 12   midodrine  (PROAMATINE ) 5 MG tablet Take 1 tablet (5 mg total) by mouth 3 (three) times daily as needed. Take when BP does drop less than 110 when standing. Additional dose at 2 pm if still dropping below 110 when standing 90 tablet 3   Multiple Vitamin (MULTIVITAMIN) tablet Take 1 tablet by mouth daily with lunch.     Omega-3 Fatty Acids (FISH OIL PO) Take 1,500 mg by mouth daily with lunch.     pantoprazole  (PROTONIX ) 40 MG tablet Take 1 tablet (40 mg total) by mouth daily. 90 tablet 3   Psyllium 83 % POWD Take 15 mLs by mouth at bedtime.     rosuvastatin  (CRESTOR ) 10 MG tablet Take 1 tablet (10 mg total) by mouth every other day. 45 tablet 1    Semaglutide , 1 MG/DOSE, 4 MG/3ML SOPN Inject 1 mg as directed once a week. 3 mL 3   sodium chloride  (OCEAN) 0.65 % SOLN nasal spray Place 1 spray into both nostrils daily as needed for congestion (Sinus).     tamsulosin  (FLOMAX ) 0.4 MG CAPS capsule TAKE ONE CAPSULE BY MOUTH DAILY 90 capsule 3   traMADol  (ULTRAM ) 50 MG tablet TAKE 1 TABLET BY MOUTH EVERY 12 HOURS AS NEEDED 60 tablet 2   No current facility-administered medications on file prior to visit.    Review of Systems  Constitutional:  Negative for chills and fever.  Respiratory:  Negative for cough.   Cardiovascular:  Negative for chest pain and palpitations.  Gastrointestinal:  Negative for abdominal pain, nausea and vomiting.      Objective:    BP 128/78   Pulse 77   Temp 98.3 F (36.8 C) (Oral)   Ht 6' (1.829 m)   Wt 203 lb (  92.1 kg)   SpO2 95%   BMI 27.53 kg/m  BP Readings from Last 3 Encounters:  12/09/23 128/78  12/03/23 120/64  10/30/23 115/71   Wt Readings from Last 3 Encounters:  12/09/23 203 lb (92.1 kg)  12/03/23 203 lb (92.1 kg)  10/30/23 198 lb (89.8 kg)    Physical Exam Vitals reviewed.  Constitutional:      Appearance: He is well-developed.  Cardiovascular:     Rate and Rhythm: Regular rhythm.     Heart sounds: Normal heart sounds.  Pulmonary:     Effort: Pulmonary effort is normal. No respiratory distress.     Breath sounds: Normal breath sounds. No wheezing, rhonchi or rales.  Skin:    General: Skin is warm and dry.  Neurological:     Mental Status: He is alert.  Psychiatric:        Speech: Speech normal.        Behavior: Behavior normal.

## 2023-12-15 ENCOUNTER — Other Ambulatory Visit: Payer: Self-pay | Admitting: Family

## 2023-12-15 ENCOUNTER — Other Ambulatory Visit: Payer: Self-pay | Admitting: Cardiovascular Disease

## 2023-12-15 DIAGNOSIS — I472 Ventricular tachycardia, unspecified: Secondary | ICD-10-CM

## 2023-12-15 DIAGNOSIS — I5022 Chronic systolic (congestive) heart failure: Secondary | ICD-10-CM

## 2023-12-17 ENCOUNTER — Ambulatory Visit: Admitting: Family

## 2023-12-17 DIAGNOSIS — K750 Abscess of liver: Secondary | ICD-10-CM | POA: Insufficient documentation

## 2023-12-17 NOTE — Assessment & Plan Note (Signed)
  H/o 5 mm ground-glass nodule in the right lower lobe, follow up to ensure 5 years stability .Pending repeat CT chest.

## 2023-12-17 NOTE — Assessment & Plan Note (Signed)
 Hospitalization course reviewed.  Medications reconciled.  Abdominal pain is resolved.  Advised patient to continue outpatient course Augmentin .  Follow-up in CT abdomen and pelvis is scheduled with infectious disease.  Will follow

## 2023-12-17 NOTE — Assessment & Plan Note (Signed)
 Referral to endocrinology for further surveillance, hormone evaluation.

## 2023-12-17 NOTE — Assessment & Plan Note (Signed)
 Excellent control.Goal of A1c less than 7.5.   Discussed risk of hypoglycemia on glipizide .   Continue Ozempic  1 mg weekly

## 2023-12-17 NOTE — Assessment & Plan Note (Signed)
 Blood pressure well-controlled.  Advised to remain off of spironolactone  and lisinopril .  Continue carvedilol  3.125 mg twice daily as prescribed by cardiology.  Continue midodrine  5 mg TID as needed.

## 2023-12-18 ENCOUNTER — Telehealth: Payer: Self-pay

## 2023-12-18 ENCOUNTER — Other Ambulatory Visit

## 2023-12-18 DIAGNOSIS — R899 Unspecified abnormal finding in specimens from other organs, systems and tissues: Secondary | ICD-10-CM

## 2023-12-18 LAB — CBC WITH DIFFERENTIAL/PLATELET
Basophils Absolute: 0 K/uL (ref 0.0–0.1)
Basophils Relative: 0.6 % (ref 0.0–3.0)
Eosinophils Absolute: 0.2 K/uL (ref 0.0–0.7)
Eosinophils Relative: 2.8 % (ref 0.0–5.0)
HCT: 38.5 % — ABNORMAL LOW (ref 39.0–52.0)
Hemoglobin: 13.2 g/dL (ref 13.0–17.0)
Lymphocytes Relative: 23.1 % (ref 12.0–46.0)
Lymphs Abs: 1.3 K/uL (ref 0.7–4.0)
MCHC: 34.4 g/dL (ref 30.0–36.0)
MCV: 93.3 fl (ref 78.0–100.0)
Monocytes Absolute: 0.4 K/uL (ref 0.1–1.0)
Monocytes Relative: 7.2 % (ref 3.0–12.0)
Neutro Abs: 3.6 K/uL (ref 1.4–7.7)
Neutrophils Relative %: 66.3 % (ref 43.0–77.0)
Platelets: 164 K/uL (ref 150.0–400.0)
RBC: 4.12 Mil/uL — ABNORMAL LOW (ref 4.22–5.81)
RDW: 14.1 % (ref 11.5–15.5)
WBC: 5.4 K/uL (ref 4.0–10.5)

## 2023-12-18 NOTE — Telephone Encounter (Signed)
 Copied from CRM #[id number]. Topic: Clinical - Request for Lab/Test Order >> Dec 18, 2023  9:18 AM Alexandria E wrote: Reason for CRM: Patient is wanting to come in today 12/4 around 11am for lab work, labs currently state needs to be collected, agent was advised by CAL to send a message to double check if PCP wants these labs completed for patient or changed to future.

## 2023-12-18 NOTE — Addendum Note (Signed)
 Addended by: TANDA HARVEY D on: 12/18/2023 11:21 AM   Modules accepted: Orders

## 2023-12-19 LAB — HEPATITIS A ANTIBODY, TOTAL: Hepatitis A AB,Total: REACTIVE — AB

## 2023-12-19 LAB — HEPATITIS A ANTIBODY, IGM: Hep A IgM: NONREACTIVE

## 2023-12-23 ENCOUNTER — Ambulatory Visit: Payer: Self-pay | Admitting: Family

## 2023-12-28 ENCOUNTER — Encounter: Payer: Self-pay | Admitting: Family

## 2023-12-31 ENCOUNTER — Encounter: Payer: Self-pay | Admitting: Family

## 2024-01-01 ENCOUNTER — Other Ambulatory Visit: Payer: Self-pay | Admitting: Family

## 2024-01-01 DIAGNOSIS — R911 Solitary pulmonary nodule: Secondary | ICD-10-CM

## 2024-01-01 NOTE — Telephone Encounter (Signed)
 Spoke to pt wife explained to her that message has been sent to provider just awaiting a response

## 2024-01-01 NOTE — Telephone Encounter (Signed)
 Copied from CRM #[id number]. Topic: General - Other >> Jan 01, 2024  2:20 PM Rosina BIRCH wrote: Reason for CRM: patient wife called stating she would like an update on a mychart message regarding a [facility] referral [phone]

## 2024-01-03 ENCOUNTER — Other Ambulatory Visit: Payer: Self-pay | Admitting: Family

## 2024-01-03 DIAGNOSIS — G8929 Other chronic pain: Secondary | ICD-10-CM

## 2024-01-04 ENCOUNTER — Ambulatory Visit: Payer: Self-pay | Admitting: Family

## 2024-01-05 NOTE — Telephone Encounter (Signed)
 Noted

## 2024-01-05 NOTE — Telephone Encounter (Signed)
 Spoke to pt scheduled him for 1st Twinrix vaccine on 01/23/24

## 2024-01-05 NOTE — Telephone Encounter (Signed)
 1. Chronic back pain, unspecified back location, unspecified back pain laterality - PDMP reviewed, last refill for Tramadol  50 mg (60 tablets on 12/04/23). Refill appropriate.  - traMADol  (ULTRAM ) 50 MG tablet; TAKE 1 TABLET BY MOUTH EVERY 12 HOURS AS NEEDED  Dispense: 60 tablet; Refill: 0  Luke Shade, MD (Covering for NP, Arnette)

## 2024-01-05 NOTE — Telephone Encounter (Signed)
 Spoke with pt and he does not have enough medication to get him through till [place], NP returns.   Refilled: 09/29/2023 Last OV: 12/09/2023 Next OV: not scheduled

## 2024-01-19 ENCOUNTER — Encounter: Payer: Self-pay | Admitting: Podiatry

## 2024-01-19 ENCOUNTER — Ambulatory Visit: Admitting: Podiatry

## 2024-01-19 DIAGNOSIS — B351 Tinea unguium: Secondary | ICD-10-CM | POA: Diagnosis not present

## 2024-01-19 DIAGNOSIS — M79674 Pain in right toe(s): Secondary | ICD-10-CM

## 2024-01-19 DIAGNOSIS — I350 Nonrheumatic aortic (valve) stenosis: Secondary | ICD-10-CM

## 2024-01-19 DIAGNOSIS — M79675 Pain in left toe(s): Secondary | ICD-10-CM

## 2024-01-19 DIAGNOSIS — E1142 Type 2 diabetes mellitus with diabetic polyneuropathy: Secondary | ICD-10-CM

## 2024-01-19 NOTE — Progress Notes (Signed)
"  °  Subjective:  Patient ID: [name] [name], male    DOB: May 31, 1940,  MRN: [id number]  [name] [name] presents to clinic today for at risk foot care with history of diabetic neuropathy and painful thick toenails that are difficult to trim. Pain interferes with ambulation. Aggravating factors include wearing enclosed shoe gear. Pain is relieved with periodic professional debridement.  Chief Complaint  Patient presents with   Diabetes    A1c 7.1 . NP Dineen is still his PCP. Last visit was in  Dec. 2025   New problem(s): None.   PCP is [name], Rollene MATSU, [name].  Allergies[1]  Review of Systems: Negative except as noted in the HPI.  Objective: No changes noted in today's physical examination. There were no vitals filed for this visit. [name] [name] is a pleasant 84 y.o. male WD, WN in NAD. AAO x 3.  Vascular Examination: Capillary refill time immediate b/l. Vascular status intact b/l with palpable pedal pulses. Pedal hair present b/l. No pain with calf compression b/l. Skin temperature gradient WNL b/l. No cyanosis or clubbing b/l. No ischemia or gangrene noted b/l. No edema noted b/l LE.  Neurological Examination: Sensation grossly intact b/l with 10 gram monofilament. Vibratory sensation intact b/l. Pt has subjective symptoms of neuropathy.  Dermatological Examination: Pedal skin with normal turgor, texture and tone b/l.  No open wounds. No interdigital macerations.   Toenails 1-5 b/l thick, discolored, elongated with subungual debris and pain on dorsal palpation.   No hyperkeratotic nor porokeratotic lesions present on today's visit.  Musculoskeletal Examination: Muscle strength 5/5 to all lower extremity muscle groups bilaterally. Hammertoe(s) 2-5 bilaterally. Pain on palpation to navicular tuberosity left foot. No edema, no warmth. Mild pes planus b/l.  Radiographs: None  Assessment/Plan: 1. Pain due to onychomycosis of toenails of both feet   2. Diabetic  polyneuropathy associated with type 2 diabetes mellitus [facility])   Consent given for treatment. Patient examined. All patient's and/or POA's questions/concerns addressed on today's visit. Mycotic toenails 1-5 b/l debrided in length and girth without incident. Continue foot and shoe inspections daily. Monitor blood glucose per PCP/Endocrinologist's recommendations.Continue soft, supportive shoe gear daily. Report any pedal injuries to medical professional. Call office if there are any quesitons/concerns. -Patient/POA to call should there be question/concern in the interim.   No follow-ups on file.  [name] [name], DPM       LOCATION: [address] [place] [address]                   Office [phone]   [place] LOCATION: [address] [place], [place] [address] Office [phone]     [1]  Allergies Allergen Reactions   Metformin  And Related Diarrhea and Other (See Comments)    Leg cramps, also   Azithromycin Other (See Comments)    Not recommended for cardiac patients- no reaction   Other Other (See Comments)    If taking antibiotics for awhile, thrush results   Percocet [Oxycodone -Acetaminophen ] Other (See Comments)    Hallucinations   Jardiance  [Empagliflozin ] Other (See Comments)    Tongue itching/ Thrush reaction   "

## 2024-01-19 NOTE — Telephone Encounter (Signed)
 Called patient and notified him of the following recommendation from Dr. [name].  Small pericardial effusion likely not a significant finding   With that said, we probably need a echo in the next few months to look at his aortic valve which previously had moderate stenosis   Perhaps we can set up an echo in the next several months to look at aortic valve stenosis, make sure effusion does not get bigger   Thx  TGollan   Patient verbalizes understanding. Order placed for Echocardiogram.

## 2024-01-20 ENCOUNTER — Ambulatory Visit: Admitting: Pulmonary Disease

## 2024-01-20 ENCOUNTER — Inpatient Hospital Stay
Admission: RE | Admit: 2024-01-20 | Discharge: 2024-01-20 | Disposition: A | Payer: Self-pay | Source: Ambulatory Visit | Attending: Pulmonary Disease | Admitting: Pulmonary Disease

## 2024-01-20 ENCOUNTER — Telehealth: Payer: Self-pay

## 2024-01-20 ENCOUNTER — Encounter: Payer: Self-pay | Admitting: Pulmonary Disease

## 2024-01-20 VITALS — BP 118/70 | HR 73 | Temp 97.6°F | Ht 72.0 in | Wt 206.2 lb

## 2024-01-20 DIAGNOSIS — F17210 Nicotine dependence, cigarettes, uncomplicated: Secondary | ICD-10-CM | POA: Diagnosis not present

## 2024-01-20 DIAGNOSIS — I255 Ischemic cardiomyopathy: Secondary | ICD-10-CM

## 2024-01-20 DIAGNOSIS — Z9289 Personal history of other medical treatment: Secondary | ICD-10-CM

## 2024-01-20 DIAGNOSIS — B9689 Other specified bacterial agents as the cause of diseases classified elsewhere: Secondary | ICD-10-CM

## 2024-01-20 DIAGNOSIS — R911 Solitary pulmonary nodule: Secondary | ICD-10-CM

## 2024-01-20 NOTE — Patient Instructions (Signed)
 VISIT SUMMARY:  Today, you were seen for the evaluation of a lung nodule that was initially identified about two to two and a half years ago. The nodule has remained stable in size and is likely benign. We also reviewed your recent treatment for a liver abscess and discussed your cardiac history, including your pacemaker and defibrillator.  YOUR PLAN:  -STABLE PULMONARY NODULE: A pulmonary nodule is a small, round growth in the lung. Your nodule, identified as a ground glass opacity, has not changed in size over the past two years, which suggests it is likely benign. We will schedule a follow-up CT scan in December 2026 to continue monitoring it.  INSTRUCTIONS:  Please schedule a follow-up CT scan in December 2026 to monitor the pulmonary nodule.

## 2024-01-20 NOTE — Progress Notes (Unsigned)
 "  Subjective:    Patient ID: [name] [name], male    DOB: 07/22/1940, 84 y.o.   MRN: [id number]  Patient Care Team: Dineen Rollene MATSU, [name] as PCP - General (Family Medicine) Perla Evalene PARAS, MD as PCP - Cardiology (Cardiology) Cindie Ole DASEN, MD as PCP - Electrophysiology (Cardiology) Marcelino Nurse, MD as Consulting Physician (Pain Medicine) Hester Alm BROCKS, MD (Dermatology)  Chief Complaint  Patient presents with   Consult    No breathing problems. Ct chest done on 12/22/2023.     BACKGROUND:   HPI    Review of Systems A 10 point review of systems was performed and it is as noted above otherwise negative.   Past Medical History:  Diagnosis Date   Aortic insufficiency    a. noted on TTE 2015; b. 06/2019 Echo: AI not visualized.   Arthritis    CAD (coronary artery disease)    a. remote PCI in 1991 and 2005; b. MV 3/15: old inferior MI, no ischemia, LVEF 50%, slight inferior wall hypokniesis; c. 06/2019 NSTEMI/PCI: LM nl, LAD 80p/m (Atherectomy & 4.5x18 Resolute Onyx DES), 2m/d, D1 75 (PTCA), RI patent stent, LCX nl, RCA 100p, RPAV fills via L->R collats from LCX.   Chicken pox    Colon polyps    4 pre-cancerous    Diverticulitis    DM type 2 (diabetes mellitus, type 2) (HCC)    Family history of adverse reaction to anesthesia    GERD (gastroesophageal reflux disease)    Heart murmur    History of kidney stones    HOH (hard of hearing)    Hyperlipidemia    Hypertension    Ischemic cardiomyopathy    a. TTE 2015: EF  50-55%, mild global HK; b. 06/2019 Echo: EF 45-50%, Gr1 DD, basal inf AK. Triv MR.   Kidney stones    Melanoma (HCC) 1980   Resected from his back   Mitral regurgitation    a. noted on TTE 2015   Myocardial infarction [facility])     Past Surgical History:  Procedure Laterality Date   BIV UPGRADE N/A 02/26/2021   Procedure: BIV ICD UPGRADE;  Surgeon: Cindie Ole DASEN, MD;  Location: [facility] INVASIVE CV LAB;  Service: Cardiovascular;  Laterality:  N/A;   CHOLECYSTECTOMY  2012   COLONOSCOPY     in 2003 with polyp removed and leak anastomosis had to have open abdominal surgery    CORONARY ANGIOPLASTY WITH STENT PLACEMENT  1991 & 2005   CORONARY ATHERECTOMY N/A 07/08/2019   Procedure: CORONARY ATHERECTOMY;  Surgeon: Dann Candyce RAMAN, MD;  Location: [facility] INVASIVE CV LAB;  Service: Cardiovascular;  Laterality: N/A;   CORONARY BALLOON ANGIOPLASTY N/A 07/08/2019   Procedure: CORONARY BALLOON ANGIOPLASTY;  Surgeon: Dann Candyce RAMAN, MD;  Location: [facility] INVASIVE CV LAB;  Service: Cardiovascular;  Laterality: N/A;  diagonal    CORONARY PRESSURE/FFR STUDY N/A 07/07/2019   Procedure: INTRAVASCULAR PRESSURE WIRE/FFR STUDY;  Surgeon: Mady Bruckner, MD;  Location: [facility] INVASIVE CV LAB;  Service: Cardiovascular;  Laterality: N/A;   CORONARY STENT INTERVENTION N/A 07/08/2019   Procedure: CORONARY STENT INTERVENTION;  Surgeon: Dann Candyce RAMAN, MD;  Location: [facility] INVASIVE CV LAB;  Service: Cardiovascular;  Laterality: N/A;  lad   CORONARY ULTRASOUND/IVUS N/A 07/08/2019   Procedure: Intravascular Ultrasound/IVUS;  Surgeon: Dann Candyce RAMAN, MD;  Location: [facility] INVASIVE CV LAB;  Service: Cardiovascular;  Laterality: N/A;   CYSTOSCOPY/URETEROSCOPY/HOLMIUM LASER/STENT PLACEMENT Right 01/02/2018   Procedure: CYSTOSCOPY/URETEROSCOPY/HOLMIUM LASER/STENT PLACEMENT;  Surgeon: Francisca Redell BROCKS, MD;  Location: [facility] ORS;  Service: Urology;  Laterality: Right;   ESOPHAGOGASTRODUODENOSCOPY (EGD) WITH PROPOFOL  N/A 11/19/2016   Procedure: ESOPHAGOGASTRODUODENOSCOPY (EGD) WITH PROPOFOL ;  Surgeon: Jinny Carmine, MD;  Location: [facility] ENDOSCOPY;  Service: Endoscopy;  Laterality: N/A;   ESOPHAGOGASTRODUODENOSCOPY (EGD) WITH PROPOFOL  N/A 02/02/2019   Procedure: ESOPHAGOGASTRODUODENOSCOPY (EGD) WITH PROPOFOL ;  Surgeon: Jinny Carmine, MD;  Location: [facility] ENDOSCOPY;  Service: Endoscopy;  Laterality: N/A;   ICD IMPLANT N/A 02/02/2020   Procedure: ICD IMPLANT;  Surgeon:  Cindie Ole DASEN, MD;  Location: [facility] INVASIVE CV LAB;  Service: Cardiovascular;  Laterality: N/A;   LEAD REVISION/REPAIR N/A 02/16/2020   Procedure: LEAD REVISION/REPAIR;  Surgeon: Inocencio Soyla Lunger, MD;  Location: [facility] INVASIVE CV LAB;  Service: Cardiovascular;  Laterality: N/A;   LEFT HEART CATH N/A 07/08/2019   Procedure: Left Heart Cath;  Surgeon: Dann Candyce RAMAN, MD;  Location: [facility] INVASIVE CV LAB;  Service: Cardiovascular;  Laterality: N/A;   LEFT HEART CATH AND CORONARY ANGIOGRAPHY N/A 07/07/2019   Procedure: LEFT HEART CATH AND CORONARY ANGIOGRAPHY;  Surgeon: Mady Bruckner, MD;  Location: [facility] INVASIVE CV LAB;  Service: Cardiovascular;  Laterality: N/A;   LITHOTRIPSY  2015   MELANOMA EXCISION  1980   malignant   NERVE SURGERY  2015   ulna nerve   pre cancer lesion from scalp  09/2022   RIGHT HEART CATH AND CORONARY ANGIOGRAPHY Bilateral 11/26/2022   Procedure: RIGHT HEART CATH AND CORONARY ANGIOGRAPHY;  Surgeon: Mady Bruckner, MD;  Location: [facility] INVASIVE CV LAB;  Service: Cardiovascular;  Laterality: Bilateral;   TONSILLECTOMY  1945   WISDOM TOOTH EXTRACTION      Patient Active Problem List   Diagnosis Date Noted   Liver abscess 12/17/2023   Adrenal nodule 12/09/2023   PAD (peripheral artery disease) 09/09/2023   Right leg swelling 07/03/2022   Myalgia 02/11/2022   Bronchitis 11/28/2021   Constipation 08/27/2021   Elevated liver enzymes 03/12/2021   Diabetic neuropathy (HCC) 02/15/2021   Lung nodule 07/31/2020   Elevated bilirubin 07/10/2020   Fatigue 03/31/2020   Malfunction of implantable defibrillator ventricular (ICD) lead 02/15/2020   Cardiomyopathy (HCC)    Chronic heart failure with mildly reduced ejection fraction (HFmrEF, 41-49%) (HCC) 02/02/2020   Mural thrombus of left ventricle 02/02/2020   Cardiac resynchronization therapy defibrillator (CRT-D) in place 02/02/2020   Ischemic cardiomyopathy    VT (ventricular tachycardia) (HCC) 07/09/2019    NSTEMI (non-ST elevated myocardial infarction) (HCC) 07/07/2019   De Quervain's tenosynovitis, left 05/17/2019   Esophagitis, unspecified without bleeding    Esophageal ulcer 11/20/2018   Acute left flank pain 10/23/2018   Post-nasal drip 10/23/2018   Allergic rhinitis 10/23/2018   Right shoulder pain 07/03/2018   Skin lesion 06/12/2018   Pruritus 06/12/2018   Kidney stones, calcium  oxalate 05/28/2018   Atherosclerosis of native coronary artery with stable angina pectoris 05/17/2018   GERD (gastroesophageal reflux disease) 02/23/2018   SCC (squamous cell carcinoma) 05/15/2017   Cervical stenosis of spine 05/15/2017   Tobacco abuse 01/26/2017   Cardiac murmur 01/08/2017   Right renal mass 10/31/2016   Abdominal pain 10/31/2016   Fatty liver 10/31/2016   Hematuria 10/31/2016   Acute non-recurrent maxillary sinusitis 10/17/2015   Carotid artery disease 10/03/2015   Neck pain 07/28/2015   Atherosclerosis of abdominal aorta 07/28/2015   CAD (coronary artery disease) 07/10/2015   Essential hypertension 07/10/2015   Mixed hyperlipidemia 07/10/2015   Controlled type 2 diabetes mellitus with complication, without long-term current use of insulin  (HCC) 07/10/2015  History of melanoma 07/10/2015   Chronic low back pain 07/10/2015   BPH (benign prostatic hyperplasia) 07/10/2015   Bilateral hand numbness 07/10/2015   Insomnia 07/10/2015    Family History  Problem Relation Age of Onset   Hyperlipidemia Mother    Hypertension Mother    Heart disease Mother    Diabetes Mother    Heart attack Mother    Colon cancer Father        metasized to liver, adrenal, lungs   Lung cancer Father    Kidney cancer Father        malignant capsulated kidney tumor   Diabetes Father    Liver cancer Father    Bladder Cancer Neg Hx    Prostate cancer Neg Hx     Social History   Tobacco Use   Smoking status: Some Days    Types: Pipe    Passive exposure: Current   Smokeless tobacco: Former     Quit date: 10/17/2015  Substance Use Topics   Alcohol  use: Yes    Alcohol /week: 0.0 - 1.0 standard drinks of alcohol     Comment: OCCASIONALLY    Allergies[1]  Active Medications[2]  Immunization History  Administered Date(s) Administered   Fluad Quad(high Dose 65+) 10/13/2020, 09/28/2021   Fluad Trivalent(High Dose 65+) 11/30/2022   Fluzone Influenza virus vaccine,trivalent (IIV3), split virus 10/22/2013, 01/06/2015   Hep A / Hep B 04/29/2019, 06/02/2019   INFLUENZA, HIGH DOSE SEASONAL PF 11/01/2015, 10/20/2017, 09/28/2019, 10/18/2023   Influenza,inj,Quad PF,6+ Mos 10/08/2016   Influenza-Unspecified 10/01/2018   Moderna Covid-19 Fall Seasonal Vaccine 92yrs & older 10/11/2021   Moderna Sars-Covid-2 Vaccination 05/08/2020   PFIZER(Purple Top)SARS-COV-2 Vaccination 02/04/2019, 02/25/2019, 10/12/2019   PNEUMOCOCCAL CONJUGATE-20 02/07/2021   Pfizer Covid-19 Vaccine Bivalent Booster 58yrs & up 11/04/2020   Td 01/15/1995        Objective:     Vitals:   01/20/24 1017  BP: 118/70  Pulse: 73  Temp: 97.6 F (36.4 C)  Height: 6' (1.829 m)  Weight: 206 lb 3.2 oz (93.5 kg)  SpO2: 97%  TempSrc: Temporal  BMI (Calculated): 27.96     SpO2: 97 %  GENERAL: HEAD: Normocephalic, atraumatic.  EYES: Pupils equal, round, reactive to light.  No scleral icterus.  MOUTH:  NECK: Supple. No thyromegaly. Trachea midline. No JVD.  No adenopathy. PULMONARY: Good air entry bilaterally.  No adventitious sounds. CARDIOVASCULAR: S1 and S2. Regular rate and rhythm.  ABDOMEN: MUSCULOSKELETAL: No joint deformity, no clubbing, no edema.  NEUROLOGIC:  SKIN: Intact,warm,dry. PSYCH:        Assessment & Plan:   No diagnosis found.  No orders of the defined types were placed in this encounter.   No orders of the defined types were placed in this encounter.    Advised if symptoms do not improve or worsen, to please contact office for sooner follow up or seek emergency care.    I spent  xxx minutes of dedicated to the care of this patient on the date of this encounter to include pre-visit review of records, face-to-face time with the patient discussing conditions above, post visit ordering of testing, clinical documentation with the electronic health record, making appropriate referrals as documented, and communicating necessary findings to members of the patients care team.   C. Leita Sanders, MD Advanced Bronchoscopy PCCM [facility]    *This note was dictated using voice recognition software/Dragon.  Despite best efforts to proofread, errors can occur which can change the meaning. Any transcriptional errors that result from  this process are unintentional and may not be fully corrected at the time of dictation.    [1]  Allergies Allergen Reactions   Metformin  And Related Diarrhea and Other (See Comments)    Leg cramps, also   Azithromycin Other (See Comments)    Not recommended for cardiac patients- no reaction   Other Other (See Comments)    If taking antibiotics for awhile, thrush results   Percocet [Oxycodone -Acetaminophen ] Other (See Comments)    Hallucinations   Jardiance  [Empagliflozin ] Other (See Comments)    Tongue itching/ Thrush reaction  [2]  Current Meds  Medication Sig   amiodarone  (PACERONE ) 200 MG tablet Take 1 tablet (200 mg total) by mouth daily.   aspirin  EC 81 MG tablet Take 1 tablet (81 mg total) by mouth daily. Swallow whole.   azelastine  (ASTELIN ) 0.1 % nasal spray Place 1 spray into both nostrils 2 (two) times daily. Use in each nostril as directed   bisacodyl (DULCOLAX) 5 MG EC tablet Take 5 mg by mouth daily as needed for moderate constipation.   carvedilol  (COREG ) 3.125 MG tablet TAKE 1 TABLET(3.125 MG) BY MOUTH TWICE DAILY WITH A MEAL   clopidogrel  (PLAVIX ) 75 MG tablet TAKE 1 TABLET(75 MG) BY MOUTH DAILY   cyclobenzaprine  (FLEXERIL ) 5 MG tablet Take 5 mg by mouth as needed for muscle spasms.    dextromethorphan-guaiFENesin (MUCINEX DM) 30-600 MG 12hr tablet Take 1 tablet by mouth daily as needed (Sinus Congestion).   ezetimibe  (ZETIA ) 10 MG tablet TAKE 1 TABLET(10 MG) BY MOUTH DAILY   famotidine (PEPCID) 20 MG tablet Take 20 mg by mouth daily with lunch.   gabapentin  (NEURONTIN ) 100 MG capsule Take 1 capsule (100 mg total) by mouth at bedtime.   glipiZIDE  (GLUCOTROL ) 10 MG tablet Take 10 mg by mouth 2 (two) times daily before a meal.   Glucosamine 500 MG CAPS Take 1,500 mg by mouth daily as needed.   glucose blood (ACCU-CHEK GUIDE TEST) test strip USE TO CHECK BLOOD SUGAR TWICE DAILY AS DIRECTED   Glycerin-Hypromellose-PEG 400 (DRY EYE RELIEF DROPS) 0.2-0.2-1 % SOLN Place 1 drop into both eyes daily.   Lancets (ONETOUCH ULTRASOFT) lancets Use as instructed   midodrine  (PROAMATINE ) 5 MG tablet Take 1 tablet (5 mg total) by mouth 3 (three) times daily as needed. Take when BP does drop less than 110 when standing. Additional dose at 2 pm if still dropping below 110 when standing   Multiple Vitamin (MULTIVITAMIN) tablet Take 1 tablet by mouth daily with lunch.   Omega-3 Fatty Acids (FISH OIL PO) Take 1,500 mg by mouth daily with lunch.   pantoprazole  (PROTONIX ) 40 MG tablet Take 1 tablet (40 mg total) by mouth daily.   Psyllium 83 % POWD Take 15 mLs by mouth at bedtime.   rosuvastatin  (CRESTOR ) 10 MG tablet Take 1 tablet (10 mg total) by mouth every other day.   Semaglutide , 1 MG/DOSE, 4 MG/3ML SOPN Inject 1 mg as directed once a week.   sodium chloride  (OCEAN) 0.65 % SOLN nasal spray Place 1 spray into both nostrils daily as needed for congestion (Sinus).   tamsulosin  (FLOMAX ) 0.4 MG CAPS capsule TAKE ONE CAPSULE BY MOUTH DAILY   traMADol  (ULTRAM ) 50 MG tablet TAKE 1 TABLET BY MOUTH EVERY 12 HOURS AS NEEDED   "

## 2024-01-20 NOTE — Telephone Encounter (Signed)
 CT Chest image requested from [facility].

## 2024-01-21 ENCOUNTER — Encounter: Payer: Self-pay | Admitting: Pulmonary Disease

## 2024-01-23 ENCOUNTER — Ambulatory Visit (INDEPENDENT_AMBULATORY_CARE_PROVIDER_SITE_OTHER)

## 2024-01-23 ENCOUNTER — Telehealth: Payer: Self-pay

## 2024-01-23 DIAGNOSIS — B159 Hepatitis A without hepatic coma: Secondary | ICD-10-CM

## 2024-01-23 DIAGNOSIS — Z23 Encounter for immunization: Secondary | ICD-10-CM

## 2024-01-23 NOTE — Progress Notes (Signed)
 After obtaining consent, and per orders of [name] given on 01/04/24, injection of 1st TwinRx vaccine given IM by Doyce Croak, CMA. Patient received and tolerated injection well.

## 2024-01-23 NOTE — Telephone Encounter (Signed)
 Patient came today for his first TwinRx vaccine. According to TwinRx vaccine schedule states 0,1 and 6 months. Patient was not scheduled for follow up as not sure when patient should come back for return follow up. Per Rosina, advised to reach out to provider to advise when patient should return for 2nd TwinRx vaccine. Patient would like to be notified via telephone or via MyChart.

## 2024-01-23 NOTE — Telephone Encounter (Signed)
 Spoke to pt scheduled next 2 appt for Twinrix

## 2024-01-28 NOTE — Telephone Encounter (Signed)
" ° °  Primary vaccination with Twinrix involves three doses administered over a 68-month schedule.  The first dose  1/3 is followed by vaccine 2/3 one month after,  followed by 3/3 vaccine 6 months from initial.   First dose Twinrix 01/23/2024.  Schedule 2/3 vaccine one month after 1st dose.   "

## 2024-01-29 ENCOUNTER — Ambulatory Visit: Admitting: Student in an Organized Health Care Education/Training Program

## 2024-01-31 ENCOUNTER — Other Ambulatory Visit: Payer: Self-pay

## 2024-02-02 ENCOUNTER — Other Ambulatory Visit: Payer: Self-pay

## 2024-02-02 DIAGNOSIS — E279 Disorder of adrenal gland, unspecified: Secondary | ICD-10-CM

## 2024-02-02 DIAGNOSIS — D3502 Benign neoplasm of left adrenal gland: Secondary | ICD-10-CM

## 2024-02-05 ENCOUNTER — Ambulatory Visit

## 2024-02-05 DIAGNOSIS — I350 Nonrheumatic aortic (valve) stenosis: Secondary | ICD-10-CM

## 2024-02-05 LAB — ECHOCARDIOGRAM COMPLETE
AR max vel: 0.82 cm2
AV Area VTI: 0.82 cm2
AV Area mean vel: 0.84 cm2
AV Mean grad: 22 mmHg
AV Peak grad: 39.2 mmHg
Ao pk vel: 3.13 m/s
Area-P 1/2: 2.6 cm2
S' Lateral: 4.6 cm

## 2024-02-08 ENCOUNTER — Ambulatory Visit: Payer: Self-pay | Admitting: Cardiovascular Disease

## 2024-02-11 ENCOUNTER — Ambulatory Visit
Admission: RE | Admit: 2024-02-11 | Discharge: 2024-02-11 | Disposition: A | Discharge status: Home / Self Care | Source: Ambulatory Visit

## 2024-02-11 DIAGNOSIS — D3502 Benign neoplasm of left adrenal gland: Secondary | ICD-10-CM | POA: Insufficient documentation

## 2024-02-11 DIAGNOSIS — E279 Disorder of adrenal gland, unspecified: Secondary | ICD-10-CM | POA: Insufficient documentation

## 2024-02-11 LAB — POCT I-STAT CREATININE: Creatinine, Ser: 1 mg/dL (ref 0.61–1.24)

## 2024-02-11 MED ORDER — IOHEXOL 300 MG/ML  SOLN
100.0000 mL | Freq: Once | INTRAMUSCULAR | Status: AC | PRN
  Administered 2024-02-11: 100 mL via INTRAVENOUS

## 2024-02-13 ENCOUNTER — Telehealth: Payer: Self-pay | Admitting: Family

## 2024-02-13 NOTE — Telephone Encounter (Signed)
 Call [facility] endocrine Dr Medford Hoover  I was sent CT abdomen and pelvis results from 02/11/24  Concerning for liver lesion, 2.6 cm right adrenal nodule   Did Dr Hoover get this report? Has he reached out to patient ? I cannot see in epic  Does Dr Hoover need me to arrange oncology consult?

## 2024-02-18 ENCOUNTER — Ambulatory Visit: Payer: Self-pay | Admitting: Family

## 2024-02-18 NOTE — Telephone Encounter (Signed)
 Dr Glade nurse called back she stated Dr Carlin got your message and forward you a message. Dr Carlin contact the pt to schedule appt for further evaluation on his right adrenal nodule and is recommending a MRI on his liver and a possible biopsy.

## 2024-02-18 NOTE — Telephone Encounter (Signed)
 Called office and receptionist took a message for his nurse regarding the pt. Dr glade nurse Gabriella will call the office back and ask for me

## 2024-02-19 NOTE — Telephone Encounter (Signed)
 noted

## 2024-02-23 ENCOUNTER — Ambulatory Visit

## 2024-02-24 ENCOUNTER — Ambulatory Visit: Payer: Medicare Other

## 2024-03-03 ENCOUNTER — Ambulatory Visit: Payer: Medicare Other | Admitting: Dermatology

## 2024-04-22 ENCOUNTER — Ambulatory Visit: Admitting: Podiatry

## 2024-05-25 ENCOUNTER — Ambulatory Visit: Payer: Medicare Other

## 2024-06-08 ENCOUNTER — Ambulatory Visit: Admitting: Cardiology

## 2024-06-23 ENCOUNTER — Ambulatory Visit

## 2024-08-24 ENCOUNTER — Ambulatory Visit: Payer: Medicare Other

## 2024-10-27 ENCOUNTER — Ambulatory Visit

## 2024-10-27 ENCOUNTER — Ambulatory Visit: Admitting: Urology

## 2024-11-01 ENCOUNTER — Ambulatory Visit
